# Patient Record
Sex: Male | Born: 1955
Health system: Southern US, Community
[De-identification: ages and names within clinical notes are randomized; demographics above are authoritative.]

## PROBLEM LIST (undated history)

## (undated) DIAGNOSIS — M545 Low back pain, unspecified: Secondary | ICD-10-CM

## (undated) DIAGNOSIS — G4733 Obstructive sleep apnea (adult) (pediatric): Secondary | ICD-10-CM

## (undated) DIAGNOSIS — J302 Other seasonal allergic rhinitis: Secondary | ICD-10-CM

## (undated) DIAGNOSIS — Z7289 Other problems related to lifestyle: Secondary | ICD-10-CM

## (undated) DIAGNOSIS — I10 Essential (primary) hypertension: Secondary | ICD-10-CM

## (undated) DIAGNOSIS — I4821 Permanent atrial fibrillation: Secondary | ICD-10-CM

## (undated) DIAGNOSIS — F1090 Alcohol use, unspecified, uncomplicated: Secondary | ICD-10-CM

## (undated) DIAGNOSIS — I739 Peripheral vascular disease, unspecified: Secondary | ICD-10-CM

## (undated) DIAGNOSIS — I493 Ventricular premature depolarization: Secondary | ICD-10-CM

## (undated) DIAGNOSIS — I779 Disorder of arteries and arterioles, unspecified: Secondary | ICD-10-CM

## (undated) DIAGNOSIS — I4891 Unspecified atrial fibrillation: Secondary | ICD-10-CM

## (undated) DIAGNOSIS — I499 Cardiac arrhythmia, unspecified: Secondary | ICD-10-CM

## (undated) DIAGNOSIS — I2510 Atherosclerotic heart disease of native coronary artery without angina pectoris: Secondary | ICD-10-CM

## (undated) DIAGNOSIS — E785 Hyperlipidemia, unspecified: Secondary | ICD-10-CM

## (undated) DIAGNOSIS — I5030 Unspecified diastolic (congestive) heart failure: Secondary | ICD-10-CM

## (undated) DIAGNOSIS — Z9989 Dependence on other enabling machines and devices: Secondary | ICD-10-CM

## (undated) DIAGNOSIS — F1910 Other psychoactive substance abuse, uncomplicated: Secondary | ICD-10-CM

## (undated) DIAGNOSIS — K219 Gastro-esophageal reflux disease without esophagitis: Secondary | ICD-10-CM

## (undated) DIAGNOSIS — M1990 Unspecified osteoarthritis, unspecified site: Secondary | ICD-10-CM

## (undated) HISTORY — DX: Disorder of arteries and arterioles, unspecified: I77.9

## (undated) HISTORY — DX: Unspecified diastolic (congestive) heart failure: I50.30

## (undated) HISTORY — DX: Unspecified osteoarthritis, unspecified site: M19.90

## (undated) HISTORY — DX: Other seasonal allergic rhinitis: J30.2

## (undated) HISTORY — DX: Other problems related to lifestyle: Z72.89

## (undated) HISTORY — DX: Permanent atrial fibrillation: I48.21

## (undated) HISTORY — DX: Dependence on other enabling machines and devices: Z99.89

## (undated) HISTORY — PX: TONSILLECTOMY: SUR1361

## (undated) HISTORY — DX: Low back pain, unspecified: M54.50

## (undated) HISTORY — DX: Essential (primary) hypertension: I10

## (undated) HISTORY — DX: Atherosclerotic heart disease of native coronary artery without angina pectoris: I25.10

## (undated) HISTORY — DX: Hyperlipidemia, unspecified: E78.5

## (undated) HISTORY — DX: Alcohol use, unspecified, uncomplicated: F10.90

## (undated) HISTORY — DX: Ventricular premature depolarization: I49.3

## (undated) HISTORY — DX: Other psychoactive substance abuse, uncomplicated: F19.10

## (undated) HISTORY — DX: Obstructive sleep apnea (adult) (pediatric): G47.33

## (undated) HISTORY — DX: Unspecified atrial fibrillation: I48.91

## (undated) HISTORY — DX: Low back pain: M54.5

## (undated) HISTORY — DX: Peripheral vascular disease, unspecified: I73.9

---

## 1999-06-18 ENCOUNTER — Inpatient Hospital Stay (HOSPITAL_COMMUNITY): Admission: EM | Admit: 1999-06-18 | Discharge: 1999-06-21 | Payer: Self-pay | Admitting: Emergency Medicine

## 2009-06-08 HISTORY — PX: CORONARY ANGIOPLASTY WITH STENT PLACEMENT: SHX49

## 2010-05-20 ENCOUNTER — Encounter: Payer: Self-pay | Admitting: Physician Assistant

## 2010-05-20 ENCOUNTER — Inpatient Hospital Stay (HOSPITAL_COMMUNITY)
Admission: EM | Admit: 2010-05-20 | Discharge: 2010-05-22 | Payer: Self-pay | Source: Home / Self Care | Attending: Cardiology | Admitting: Cardiology

## 2010-05-22 ENCOUNTER — Encounter: Payer: Self-pay | Admitting: Cardiology

## 2010-05-27 ENCOUNTER — Telehealth (INDEPENDENT_AMBULATORY_CARE_PROVIDER_SITE_OTHER): Payer: Self-pay | Admitting: *Deleted

## 2010-06-17 ENCOUNTER — Ambulatory Visit
Admission: RE | Admit: 2010-06-17 | Discharge: 2010-06-17 | Payer: Self-pay | Source: Home / Self Care | Attending: Physician Assistant | Admitting: Physician Assistant

## 2010-06-17 DIAGNOSIS — I10 Essential (primary) hypertension: Secondary | ICD-10-CM | POA: Insufficient documentation

## 2010-06-17 DIAGNOSIS — E785 Hyperlipidemia, unspecified: Secondary | ICD-10-CM | POA: Insufficient documentation

## 2010-06-17 DIAGNOSIS — I498 Other specified cardiac arrhythmias: Secondary | ICD-10-CM | POA: Insufficient documentation

## 2010-06-17 DIAGNOSIS — I2510 Atherosclerotic heart disease of native coronary artery without angina pectoris: Secondary | ICD-10-CM | POA: Insufficient documentation

## 2010-06-17 DIAGNOSIS — F1910 Other psychoactive substance abuse, uncomplicated: Secondary | ICD-10-CM | POA: Insufficient documentation

## 2010-06-23 ENCOUNTER — Encounter: Payer: Self-pay | Admitting: Physician Assistant

## 2010-06-25 ENCOUNTER — Telehealth (INDEPENDENT_AMBULATORY_CARE_PROVIDER_SITE_OTHER): Payer: Self-pay | Admitting: *Deleted

## 2010-06-26 ENCOUNTER — Telehealth (INDEPENDENT_AMBULATORY_CARE_PROVIDER_SITE_OTHER): Payer: Self-pay | Admitting: *Deleted

## 2010-06-26 ENCOUNTER — Encounter (INDEPENDENT_AMBULATORY_CARE_PROVIDER_SITE_OTHER): Payer: Self-pay | Admitting: *Deleted

## 2010-06-26 ENCOUNTER — Encounter: Payer: Self-pay | Admitting: Physician Assistant

## 2010-07-01 ENCOUNTER — Encounter: Payer: Self-pay | Admitting: Physician Assistant

## 2010-07-04 ENCOUNTER — Encounter (INDEPENDENT_AMBULATORY_CARE_PROVIDER_SITE_OTHER): Payer: Self-pay | Admitting: *Deleted

## 2010-07-10 NOTE — Progress Notes (Signed)
Summary: Weakness  Phone Note Call from Patient Call back at [facility] Phone [phone]   Summary of Call: Pt states he had stents placed last Wed. He felt great this am. He went out and did some work outside. He was climbing a ladder and felt really weak in the knees. No syncope, near-syncope, SOB, chest pain or other symptoms. Pt states BP was 133/67 and blood sugar was 99.   He will monitor symptoms. If symptoms continue or worsening pt will go to ER for eval. Pt verbalized understanding.  Initial call taken by: Cyril Loosen, RN, BSN,  May 27, 2010 2:23 PM

## 2010-07-10 NOTE — Assessment & Plan Note (Signed)
Summary: EPH -POST CATH [facility] 12/15   Visit Type:  Follow-up Primary Provider:  Lynden Oxford   History of Present Illness: patient presents for post hospital followup from [facility], where he presented directly for evaluation of CP.  Patient ruled out for MI with serial markers. He underwent coronary angiography, status post history of BMS of the LAD in 2001, and was found to have severe proximal RCA and mid LAD stenoses, both successfully treated with DES. There was residual 75-80% distal RCA stenosis, not amenable to PCI.   Of note, patient was not placed on a beta blocker, secondary to bradycardia. Recommendations were to further evaluate the distal RCA lesion with an outpatient Myoview.  Clinically, the patient has not had any recurrent chest discomfort. He has, however, experienced intermittent dizziness, following moderate activity, but no frank syncope. He is not on any nodal blocking agents. Of note, he has recorded pulse readings as low as 40, following these episodes, but with stable BP.  Patient has since stopped drinking alcohol, and smoking marijuana. He notes no complications of the right wrist incision site. He is plagued by a probable pulled muscle just above the left knee, and has had some difficulty ambulating.  Preventive Screening-Counseling & Management  Alcohol-Tobacco     Smoking Status: quit     Year Quit: 2001  Current Medications (verified): 1)  Th Enteric Aspirin 325 Mg Tbec (Aspirin) .... Take 1 Tablet By Mouth Once A Day 2)  Tramadol Hcl 50 Mg Tabs (Tramadol Hcl) .... Take 1-2 Tablet By Mouth Two Times A Day 3)  Lisinopril 40 Mg Tabs (Lisinopril) .... Take 1/2 Tablet By Mouth Once A Day 4)  Simvastatin 40 Mg Tabs (Simvastatin) .... Take 1 Tablet By Mouth Once A Day 5)  Maxzide-25 37.5-25 Mg Tabs (Triamterene-Hctz) .... Take 1 Tablet By Mouth Once A Day 6)  Fluticasone Propionate 50 Mcg/act Susp (Fluticasone Propionate) .... 2 Sprays/nostril Daily 7)   Loratadine 10 Mg Tabs (Loratadine) .... Take 1 Tablet By Mouth Once A Day 8)  Multivitamins  Tabs (Multiple Vitamin) .... Take 1 Tablet By Mouth Once A Day 9)  Alprazolam 0.5 Mg Tabs (Alprazolam) .... Take 1 Tablet By Mouth Once A Day At [facility] 10)  Miralax  Powd (Polyethylene Glycol 3350) .Marland KitchenMarland KitchenMarland Kitchen 17 Gm Mixed With 8oz Water By Mouth Daily 11)  Fish Oil Double Strength 1200 Mg Caps (Omega-3 Fatty Acids) .... Take 1 Tablet By Mouth Two Times A Day 12)  Effient 10 Mg Tabs (Prasugrel Hcl) .... Take 1 Tablet By Mouth Once A Day 13)  Nitrostat 0.4 Mg Subl (Nitroglycerin) .... Use As Directed Chest Pain As Needed 14)  Folic Acid 1 Mg Tabs (Folic Acid) .... Take 1 Tablet By Mouth Once A Day  Allergies (verified): 1)  ! Pcn  Comments:  Nurse/Medical Assistant: The patient's medication list and allergies were reviewed with the patient and were updated in the Medication and Allergy Lists.  Past History:  Past Medical History: Last updated: 06/17/2010  1. Chest pain/CAD.       a.     June 19, 1999, catheterization feeling 90% stenosis in        the LAD with otherwise nonobstructive disease.  An EF of 55% with        mid to distal anterior hypokinesis.  The LAD was successfully        stented with a bare-metal stent.   2. Hypertension.   3. Hyperlipidemia.   4. Osteoarthritis.   5.  Low back pain.   6. Seasonal allergies.   7. Obstructive sleep apnea using CPAP.   8. Polysubstance use including marijuana and Vicodin, which he       obtained from the street.   Social History: Smoking Status:  quit  Review of Systems       No fevers, chills, hemoptysis, dysphagia, melena, hematocheezia, hematuria, rash, claudication, orthopnea, pnd, pedal edema. All other systems negative.   Vital Signs:  Patient profile:   55 year old male Height:      68 inches Weight:      268 pounds BMI:     40.90 Pulse rate:   49 / minute BP sitting:   134 / 79  (left arm) Cuff size:   large  Vitals Entered By:  Carlye Grippe (June 17, 2010 10:49 AM)  Nutrition Counseling: Patient's BMI is greater than 25 and therefore counseled on weight management options.  Physical Exam  Additional Exam:  GEN: 55 year old male, morbidly obese, no distress HEENT: NCAT,PERRLA,EOMI NECK: palpable pulses, no bruits; no JVD; no TM LUNGS: CTA bilaterally HEART: RRR (S1S2); no significant murmurs; no rubs; no gallops ABD: soft, NT; intact BS [id number] right wrist incision site, but no hematoma or ecchymosis; palpable RP SKIN: warm, dry MUSC: no obvious deformity NEURO: A/O (x3)     Impression & Recommendations:  Problem # 1:  CAD (ICD-414.00)  clinically stable on current medication regimen, with no recurrent angina. Will defer recommendation for a followup stress Myoview, given his current inability to walk, at this point in time. Moreover, he is not experiencing any CP. Of note, I would like to do this as a GXT, to assess for chronotropic incompetence.  Problem # 2:  BRADYCARDIA (ICD-427.89)  Will order a 48 hour Holter monitor to rule out marked bradycardia, as possible etiology for these recent episodes of dizziness. Of note, this may be related to his residual RCA disease. We'll arrange early clinic followup with myself and Dr. Andee Lineman.  Problem # 3:  HYPERTENSION (ICD-401.9) Assessment: Comment Only  His updated medication list for this problem includes:    Th Enteric Aspirin 325 Mg Tbec (Aspirin) .Marland Kitchen... Take 1 tablet by mouth once a day    Lisinopril 40 Mg Tabs (Lisinopril) .Marland Kitchen... Take 1/2 tablet by mouth once a day    Maxzide-25 37.5-25 Mg Tabs (Triamterene-hctz) .Marland Kitchen... Take 1 tablet by mouth once a day  Problem # 4:  SUBSTANCE ABUSE, MULTIPLE (ICD-305.90)  reportedly has since stopped drinking EtOH, and smoking marijuana. Quit tobacco in 2001.  Problem # 5:  DYSLIPIDEMIA (ICD-272.4)  recent LDL 72. Continue current dose of simvastatin.  Other Orders: EKG w/ Interpretation (93000) Holter  Monitor (Holter Monitor)  Patient Instructions: 1)  Follow up with Dr. Earnestine Leys on Monday, July 28, 2010 at 11am. 2)  Your physician has recommended that you wear a holter monitor.  Holter monitors are medical devices that record the heart's electrical activity. Doctors most often use these monitors to diagnose arrhythmias. Arrhythmias are problems with the speed or rhythm of the heartbeat. The monitor is a small, portable device. You can wear one while you do your normal daily activities. This is usually used to diagnose what is causing palpitations/syncope (passing out). 3)  Your physician recommends that you continue on your current medications as directed. Please refer to the Current Medication list given to you today.

## 2010-07-10 NOTE — Progress Notes (Signed)
Summary: PHONE: MEDICATIONS AND STRESS TEST  Phone Note Call from Patient Call back at Home Phone [phone]   Caller: Patient Summary of Call: Please call Mr. custard in regards to the letter you gave him about his stress test. He has questions about his medications. Initial call taken by: Zachary George,  June 26, 2010 4:07 PM  Follow-up for Phone Call        Left message to call back to on pt's voicemail. Cyril Loosen, RN, BSN  June 27, 2010 11:37 AM Pt questioned if he could take BP meds d/t wording on instruction sheet. However, instruction sheet also states to take meds with water. Pt informed he can take meds with water the morning of test. Pt verbalized understanding.  Follow-up by: Cyril Loosen, RN, BSN,  June 27, 2010 5:00 PM

## 2010-07-10 NOTE — Letter (Signed)
Summary: Pharmacist, community at [facility]. [address]   [place], [place] [address]   Phone: [phone]  Fax: [phone]      [facility] Cardiovascular Services  Cardiolite Stress Test     [facility] [place]  Your doctor has ordered a Cardiolite Stress Test to help determine the condition of your heart during stress. If you take blood pressure medicine, ask your doctor if you should take it the day of your test. You should not have anything to eat or drink at least 4 hours before your test is scheduled, and no caffeine (coffee, tea, decaf. or chocolate) for 24 hours before your test.   You will need to register at the Outpatient/Main Entrance at the hospital 30 minutes before your appointment time. It is a good idea to bring a copy of your order with you. They will direct you to the Diagnostic Imaging (Radiology) Department.  You will be asked to undress from the waist up and be given a hospital gown to wear, so dress comfortably from the waist down, for example:    Sweat pants, shorts or skirt   Rubber-soled lace up shoes (i.e. tennis shoes)  Plan on about three hours from registration to release from the hospital.    Date of Test:              Time of Test              You may take all of your medications with water the morning of your test.

## 2010-07-10 NOTE — Progress Notes (Signed)
Summary: Possibly schedule Stress Cardiolite/Effient available  Phone Note Outgoing Call   Call placed by: Cyril Loosen, RN, BSN,  June 25, 2010 4:48 PM Summary of Call: Pt notified he can pick up Effient from assistance. Pt states Gene mentioned a stress Cardiolite during last office visit but was waiting for pt's leg to heal. Pt states he thinks he could do this test next week if Gene still feels he needs this. Will have Gene review and notify pt. Initial call taken by: Cyril Loosen, RN, BSN,  June 25, 2010 4:50 PM  Follow-up for Phone Call        yes, if he is now able to walk, I want to proceed with a GXT Cardiolite, to r/o chronotropic incompetence Follow-up by: Nelida Meuse, PA-C,  June 26, 2010 11:54 AM  Additional Follow-up for Phone Call Additional follow up Details #1::        Will have Vicky schedule and notify pt.  I will put in order and instruction sheet.  Additional Follow-up by: Cyril Loosen, RN, BSN,  June 26, 2010 1:40 PM

## 2010-07-10 NOTE — Letter (Signed)
Summary: Engineer, materials at [facility]  [address]   [place], [place] [address]   Phone: [phone]  Fax: [phone]        July 04, 2010 MRN: [id number]    Martin [name] [address] [place], [place]  [address]    Dear Mr. Pella,  Your test ordered by Selena Batten has been reviewed by your physician (or physician assistant) and was found to be normal or stable. Your physician (or physician assistant) felt no changes were needed at this time.  ____ Echocardiogram  __X__ Cardiac Stress Test  ____ Lab Work  ____ Peripheral vascular study of arms, legs or neck  ____ CT scan or X-ray  ____ Lung or Breathing test  __X__ Other: Heart Monitor-Stable   Thank you.   Cyril Loosen, RN, BSN    Duane Boston, M.D., F.A.C.C. Thressa Sheller, M.D., F.A.C.C. Oneal Grout, M.D., F.A.C.C. Cheree Ditto, M.D., F.A.C.C. Daiva Nakayama, M.D., F.A.C.C. Kenney Houseman, M.D., F.A.C.C. Jeanne Ivan, PA-C

## 2010-07-10 NOTE — Medication Information (Signed)
Summary: RX Folder/ PICKED UP EFFIENT  RX Folder/ PICKED UP EFFIENT   Imported By: Dorise Hiss 06/27/2010 15:40:38  _____________________________________________________________________  External Attachment:    Type:   Image     Comment:   External Document

## 2010-07-10 NOTE — Procedures (Signed)
Summary: Holter Final Summary  Holter Final Summary   Imported By: Cyril Loosen, RN, BSN 06/25/2010 16:57:02  _____________________________________________________________________  External Attachment:    Type:   Image     Comment:   External Document

## 2010-07-28 ENCOUNTER — Ambulatory Visit: Payer: Self-pay | Admitting: Cardiology

## 2010-07-28 ENCOUNTER — Ambulatory Visit (INDEPENDENT_AMBULATORY_CARE_PROVIDER_SITE_OTHER): Payer: Self-pay | Admitting: Cardiology

## 2010-07-28 ENCOUNTER — Encounter: Payer: Self-pay | Admitting: Cardiology

## 2010-07-28 DIAGNOSIS — Z9861 Coronary angioplasty status: Secondary | ICD-10-CM

## 2010-07-28 DIAGNOSIS — I2510 Atherosclerotic heart disease of native coronary artery without angina pectoris: Secondary | ICD-10-CM

## 2010-07-28 DIAGNOSIS — M1990 Unspecified osteoarthritis, unspecified site: Secondary | ICD-10-CM | POA: Insufficient documentation

## 2010-08-05 NOTE — Assessment & Plan Note (Signed)
Summary: 2 WK FU PER 1/10 OV W/GENE OK FOR THIS DAY PER GENE JM/SRS   Visit Type:  Follow-up Primary Provider:  Lynden Oxford  CC:  no cardiology complaints.  History of Present Illness: Patient had a recent nuclear scan.  The patient could not walk on the treadmill and the study was changed to [place].  There was mild to moderate decreased uptake in the inferior wall with stress and rest.  There was no significant reversibility.  There appeared to be a mild scar in the inferior wall.  Ejection fraction was 54%.  Study was performed in January of 2012. A Holter monitor was performed in January 2012.  The baseline rhythm was normal sinus rhythm.  There were frequent PVCs.  There was nocturnal bradycardia with a heart rate below 40 beats/min and one run off supraventricular tachycardia of around hundred and 20 beats/min.  No other arrhythmias were noted. The Holter monitor was performed for recent history of dizziness. The patient also underwent a cardiac catheterization several months ago for chest pain.  He has better metal stents to the LAD in 2001, recently he  was found a severe proximal RCA mid LAD stenosis both successfully treated with DES.  There was residual 75 to 80% distal RCA stenosis which was not amenable to PCI. He was seen in follow-up June 17, 2010 and reported no substernal chest pain.  He also stopped smoking marijuana and stop drinking alcohol. the patient has a carotid Doppler for a few weeks ago  which showed 50 to 69% stenosed the left side. From a cardiac standpoint she's been doing well.  He reports no chest pain or short of breath. He does complain of significant arthritis.  His primary care physician discontinued tramadol and changed him to diclofenac.   Current Medications (verified): 1)  Th Enteric Aspirin 325 Mg Tbec (Aspirin) .... Take 1 Tablet By Mouth Once A Day 2)  Lisinopril 40 Mg Tabs (Lisinopril) .... Take 1/2 Tablet By Mouth Once A Day 3)  Simvastatin 40  Mg Tabs (Simvastatin) .... Take 1 Tablet By Mouth Once A Day 4)  Maxzide-25 37.5-25 Mg Tabs (Triamterene-Hctz) .... Take 1 Tablet By Mouth Once A Day 5)  Fluticasone Propionate 50 Mcg/act Susp (Fluticasone Propionate) .... 2 Sprays/nostril Daily 6)  Loratadine 10 Mg Tabs (Loratadine) .... Take 1 Tablet By Mouth Once A Day 7)  Multivitamins  Tabs (Multiple Vitamin) .... Take 1 Tablet By Mouth Once A Day 8)  Alprazolam 0.5 Mg Tabs (Alprazolam) .... Take 1 Tablet By Mouth Once A Day At [facility] 9)  Miralax  Powd (Polyethylene Glycol 3350) .Marland KitchenMarland KitchenMarland Kitchen 17 Gm Mixed With 8oz Water By Mouth Daily 10)  Fish Oil Double Strength 1200 Mg Caps (Omega-3 Fatty Acids) .... Take 1 Tablet By Mouth Two Times A Day 11)  Effient 10 Mg Tabs (Prasugrel Hcl) .... Take 1 Tablet By Mouth Once A Day 12)  Nitrostat 0.4 Mg Subl (Nitroglycerin) .... Use As Directed Chest Pain As Needed 13)  Folic Acid 1 Mg Tabs (Folic Acid) .... Take 1 Tablet By Mouth Once A Day 14)  Cefprozil 500 Mg Tabs (Cefprozil) .... Take 1 Tab Two Times A Day 15)  Diclofenac Sodium 75 Mg Tbec (Diclofenac Sodium) .... Take 1 Tab Two Times A Day  Allergies (verified): 1)  ! Pcn  Comments:  Nurse/Medical Assistant: patient brought med list he has stopped his tramadol and started diclofenac 75 mg two times a day and also on cefprozil 500 mg two times  a day for sinus infection per Dr.David Tapper [facility]  Past History:  Family History: Last updated: 07-01-10     Mother died at age 1 several years after having had a   stroke with question of small bowel obstruction.  Father died of an MI    FAMILY HISTORY:  Mother died at age 41 several years after having had a   stroke with question of small bowel obstruction.  Father died of an MI   at 31.  Brother died of an MI at 94.     at 68.  Brother died of an MI at 22.      Social History: Last updated: 07-01-2010 The patient lives in [place] with his wife.  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  He has about a 35-pack-year history   of tobacco abuse quit in January 2001.  He drinks 6-8 ounces of liquor   per day.  He smokes marijuana weekly and says he occasionally takes   Vicodin, which is not prescribed.  He is very active at work, working in   Holiday representative, but does not routinely exercise.      Risk Factors: Smoking Status: quit (07-01-2010)  Past Medical History:  1. Chest pain/CAD.       a.     June 19, 1999, catheterization feeling 90% stenosis in        the LAD with otherwise nonobstructive disease.  An EF of 55% with        mid to distal anterior hypokinesis.  The LAD was successfully        stented with a bare-metal stent.   2. Hypertension.   3. Hyperlipidemia.   4. Osteoarthritis.   5. Low back pain.   6. Seasonal allergies.   7. Obstructive sleep apnea using CPAP.   8. Polysubstance use including marijuana and Vicodin, which he       obtained from the street.  drug eluding stent placement to the proximal RCA and mid LAD and residual 75 to 80% distal RCA stenosis January 2012 normal LV function ejection fraction 55%  Review of Systems       The patient complains of shortness of breath and joint pain.  The patient denies fatigue, malaise, fever, weight gain/loss, vision loss, decreased hearing, hoarseness, chest pain, palpitations, prolonged cough, wheezing, sleep apnea, coughing up blood, abdominal pain, blood in stool, nausea, vomiting, diarrhea, heartburn, incontinence, blood in urine, muscle weakness, leg swelling, rash, skin lesions, headache, fainting, dizziness, depression, anxiety, enlarged lymph nodes, easy bruising or bleeding, and environmental allergies.    Vital Signs:  Patient profile:   55 year old male Weight:      266 pounds BMI:     40.59 Pulse rate:   84 / minute BP sitting:   116 / 55  (right arm)  Vitals Entered By: Dreama Saa, CNA (July 28, 2010 11:10 AM)  Physical Exam  Additional Exam:  General:  Well-developed, well-nourished in no distress, obese head: Normocephalic and atraumatic eyes PERRLA/EOMI intact, conjunctiva and lids normal nose: No deformity or lesions mouth normal dentition, normal posterior pharynx neck: Supple, no JVD.  No masses, thyromegaly or abnormal cervical nodes lungs: Normal breath sounds bilaterally without wheezing.  Normal percussion heart: regular rate and rhythm with normal S1 and S2, no S3 or S4.  PMI is normal.  No pathological murmurs abdomen: Normal bowel sounds, abdomen is soft and nontender without masses, organomegaly or hernias noted.  No hepatosplenomegaly  musculoskeletal: Back normal, normal gait muscle strength and tone normal pulsus: Pulse is normal in all 4 extremities Extremities: No peripheral pitting edema neurologic: Alert and oriented x 3 skin: Intact without lesions or rashes, vitiligo cervical nodes: No significant adenopathy psychologic: Normal affect    Impression & Recommendations:  Problem # 1:  CAD (ICD-414.00) patient has a bare metal stent in 2001.  More recently underwent drug-eluting stent placement to the proximal RCA and mid LAD.  He has a residual 75 to 80% distal RCA stenosis, but denies any chest pain and also  the Cardiolite study from January 2012 showed no reversible defect although there was decreased uptake in the inferior wall  both with stress and rest.   His ejection fraction was 54%.the patient has been carefully instructed not todiscontinue  antiplatelet agents without letting our office know.   I told him that he is at high risk for stent thrombosis if  prasugrel is discontinued within the first year  even short-term  for procedures like colonoscopy or other  elective interventions. His updated medication list for this problem includes:    Th Enteric Aspirin 325 Mg Tbec (Aspirin) .Marland Kitchen... Take 1 tablet by mouth once a day    Lisinopril 40 Mg Tabs (Lisinopril) .Marland Kitchen... Take 1/2 tablet by mouth once a day    Effient 10  Mg Tabs (Prasugrel hcl) .Marland Kitchen... Take 1 tablet by mouth once a day    Nitrostat 0.4 Mg Subl (Nitroglycerin) ..... Use as directed chest pain as needed  Problem # 2:  BRADYCARDIA (ICD-427.89) the patient has nocturnal bradycardia.  His Holter monitor however showed no significant arrhythmias.no further workup is required. His updated medication list for this problem includes:    Th Enteric Aspirin 325 Mg Tbec (Aspirin) .Marland Kitchen... Take 1 tablet by mouth once a day    Lisinopril 40 Mg Tabs (Lisinopril) .Marland Kitchen... Take 1/2 tablet by mouth once a day    Effient 10 Mg Tabs (Prasugrel hcl) .Marland Kitchen... Take 1 tablet by mouth once a day    Nitrostat 0.4 Mg Subl (Nitroglycerin) ..... Use as directed chest pain as needed  Problem # 3:  SUBSTANCE ABUSE, MULTIPLE (ICD-305.90) patient is discontinue marijuana and alcohol use.  Problem # 4:  DEGENERATIVE JOINT DISEASE (ICD-715.90) the patient has symptoms of osteoarthritis.   Tramadol was discontinued.   I told him to be careful with diclofenac because of its interaction with prasugrel an increased risk for bleeding.  I asked him to minimize the use of this medication brought her to consider a combination of tramadol  with high dose Tylenol which is very effective in pain relief.  Patient Instructions: 1)  Your physician recommends that you continue on your current medications as directed. Please refer to the Current Medication list given to you today. 2)  Your physician wants you to follow-up in:6 months. You will receive a reminder letter in the mail about two months in advance. If you don't receive a letter, please call our office to schedule the follow-up appointment.

## 2010-08-18 LAB — BASIC METABOLIC PANEL
BUN: 19 mg/dL (ref 6–23)
CO2: 24 mEq/L (ref 19–32)
Calcium: 8.6 mg/dL (ref 8.4–10.5)
Chloride: 109 mEq/L (ref 96–112)
Creatinine, Ser: 1.3 mg/dL (ref 0.4–1.5)
GFR calc Af Amer: >60 mL/min (ref >60)
GFR calc non Af Amer: 57 mL/min — ABNORMAL LOW (ref >60)
Glucose, Bld: 92 mg/dL (ref 70–99)
Potassium: 3.9 mEq/L (ref 3.5–5.1)
Sodium: 139 mEq/L (ref 135–145)

## 2010-08-18 LAB — CBC
HCT: 34.6 % — ABNORMAL LOW (ref 39.0–52.0)
Hemoglobin: 11.6 g/dL — ABNORMAL LOW (ref 13.0–17.0)
MCH: 32.5 pg (ref 26.0–34.0)
MCHC: 33.5 g/dL (ref 30.0–36.0)
MCV: 96.9 fL (ref 78.0–100.0)
Platelets: 156 10*3/uL (ref 150–400)
RBC: 3.57 MIL/uL — ABNORMAL LOW (ref 4.22–5.81)
RDW: 12.7 % (ref 11.5–15.5)
WBC: 7.2 10*3/uL (ref 4.0–10.5)

## 2010-08-19 LAB — CK TOTAL AND CKMB (NOT AT ARMC)
Relative Index: INVALID (ref 0.0–2.5)
Total CK: 47 U/L (ref 7–232)

## 2010-08-19 LAB — POCT CARDIAC MARKERS
CKMB, poc: <1 ng/mL — ABNORMAL LOW (ref 1.0–8.0)
Myoglobin, poc: 63.7 ng/mL (ref 12–200)
Myoglobin, poc: 65 ng/mL (ref 12–200)

## 2010-08-19 LAB — PROTIME-INR: Prothrombin Time: 13 seconds (ref 11.6–15.2)

## 2010-08-19 LAB — CBC
HCT: 38.8 % — ABNORMAL LOW (ref 39.0–52.0)
Hemoglobin: 12.8 g/dL — ABNORMAL LOW (ref 13.0–17.0)
MCH: 32 pg (ref 26.0–34.0)
MCHC: 33 g/dL (ref 30.0–36.0)
MCV: 97 fL (ref 78.0–100.0)
MCV: 98 fL (ref 78.0–100.0)
Platelets: 180 10*3/uL (ref 150–400)
RBC: 3.96 MIL/uL — ABNORMAL LOW (ref 4.22–5.81)
RDW: 12.5 % (ref 11.5–15.5)
WBC: 7.5 10*3/uL (ref 4.0–10.5)

## 2010-08-19 LAB — RAPID URINE DRUG SCREEN, HOSP PERFORMED
Amphetamines: NOT DETECTED
Barbiturates: NOT DETECTED
Benzodiazepines: POSITIVE — AB
Cocaine: NOT DETECTED
Opiates: NOT DETECTED
Tetrahydrocannabinol: NOT DETECTED

## 2010-08-19 LAB — COMPREHENSIVE METABOLIC PANEL
AST: 38 U/L — ABNORMAL HIGH (ref 0–37)
Albumin: 3.7 g/dL (ref 3.5–5.2)
BUN: 17 mg/dL (ref 6–23)
Calcium: 9.6 mg/dL (ref 8.4–10.5)
Chloride: 106 mEq/L (ref 96–112)
Creatinine, Ser: 1.16 mg/dL (ref 0.4–1.5)
GFR calc Af Amer: >60 mL/min (ref >60)
Total Bilirubin: 0.4 mg/dL (ref 0.3–1.2)

## 2010-08-19 LAB — LIPID PANEL
Cholesterol: 168 mg/dL (ref 0–200)
HDL: 38 mg/dL — ABNORMAL LOW (ref >39)
LDL Cholesterol: 72 mg/dL (ref 0–99)
Total CHOL/HDL Ratio: 4.4 RATIO
Triglycerides: 291 mg/dL — ABNORMAL HIGH (ref <150)

## 2010-08-19 LAB — HEMOGLOBIN A1C
Hgb A1c MFr Bld: 6.1 % — ABNORMAL HIGH (ref <5.7)
Mean Plasma Glucose: 128 mg/dL — ABNORMAL HIGH (ref <117)

## 2010-08-19 LAB — TROPONIN I: Troponin I: 0.03 ng/mL (ref 0.00–0.06)

## 2010-08-19 LAB — URINALYSIS, ROUTINE W REFLEX MICROSCOPIC
Bilirubin Urine: NEGATIVE
Hgb urine dipstick: NEGATIVE
Ketones, ur: NEGATIVE mg/dL
Nitrite: NEGATIVE
Protein, ur: NEGATIVE mg/dL
Specific Gravity, Urine: 1.005 (ref 1.005–1.030)
Urobilinogen, UA: 0.2 mg/dL (ref 0.0–1.0)

## 2010-08-19 LAB — CARDIAC PANEL(CRET KIN+CKTOT+MB+TROPI)
CK, MB: 1.5 ng/mL (ref 0.3–4.0)
CK, MB: 1.6 ng/mL (ref 0.3–4.0)
Troponin I: 0.01 ng/mL (ref 0.00–0.06)

## 2010-08-19 LAB — BASIC METABOLIC PANEL
BUN: 15 mg/dL (ref 6–23)
Chloride: 106 mEq/L (ref 96–112)
Creatinine, Ser: 1.19 mg/dL (ref 0.4–1.5)
Glucose, Bld: 107 mg/dL — ABNORMAL HIGH (ref 70–99)
Potassium: 4.1 mEq/L (ref 3.5–5.1)

## 2010-10-24 NOTE — Discharge Summary (Signed)
[place]. [facility]  Patient:    Martin [name]                       MRN: [id number] Adm. Date:  [id number] Disc. Date: 06/21/99 Attending:  Junious Silk Dictator:   Dian Queen, P.A.C. [facility] CC:         Dr. Wyvonnia Lora, [place], [place].                  Referring Physician Discharge [place]  HISTORY OF PRESENT ILLNESS:  Essentially, is that of a 55 year old white male with hypertension and three-week history of intermittent chest pain, described as a burning discomfort.  He saw Dr. Margo Common and was referred to [place] for further evaluation.  Family history is positive for coronary disease.  He is a cigarette smoker.  He has been on antihypertensive medications.  ACCESSORY CLINICAL FINDINGS:  Electrolytes and renal function totally within normal limits.  Hemoglobin 14.2, with a hematocrit of 41.8 and a white count of 8700.  CKs were low with negative MBs.  Lipid profile revealed total cholesterol of 205, with HDL of 33 and LDL of 51.  LFTs were normal.  Chest x-ray was unremarkable.  COURSE IN THE HOSPITAL:  Patient was admitted with chest pain and ruled out. He ultimately was catheterized by Dr. Chales Abrahams, and found to have a 90% lesion in his  proximal/ostial LAD, as well as a 30-40% lesion in the proximal right and a 60-70% lesion in the mid/distal right.  With his anatomy, it was felt that intervention was indicated.  This was stented by Dr. Chales Abrahams to 0% without complication.  He was enrolled in the ______ study.  He did have a prolonged pause, lasting up to nine seconds, while asleep.  Toprol was discontinued and he was carefully monitored. He was seen in consultation by Dr. Danise Mina, who felt he had mild hypersomnolence with obstructive sleep apnea.  He recommended weight loss, check a TSH, and ultimately, BiPAP machine.  There was a question of reflux, as well.  He will follow him as an outpatient.  FINAL DIAGNOSES: 1. Coronary artery  disease with left anterior descending artery lesion stented o    0% without complication. 2. Obstructive sleep apnea with marked bradyarrhythmias, as above (while on    Toprol - improved when Toprol discontinued). 3. Cigarette use. 4. Family history of coronary disease. 5. Controlled hypertension.  DISPOSITION:  Patient was discharged home.  MEDICATIONS: 1. Altace 10 mg q.d. 2. ______ study drug. 3. Plavix 75 mg q.d. 4. Aspirin one daily. 5. Nitroglycerin p.r.n.  FOLLOW-UP:  He will follow up with Dr. Jens Som in four to six weeks, two weeks  with the research team, and with our pulmonary department regarding his apnea. DD:  06/11/99 TD:  06/21/99 Job: [id number]

## 2010-10-24 NOTE — Cardiovascular Report (Signed)
[place]. [facility]  Patient:    Martin [name]                       MRN: [id number] Proc. Date: 06/19/99 Adm. Date:  [id number] Attending:  Junious Silk CC:         Veneda Melter, M.D. [facility]             Martin [name], M.D.             Madolyn Frieze Martin [name], M.D. [facility]                        Cardiac Catheterization  PROCEDURES PERFORMED: 1. Left heart catheterization. 2. Left ventriculogram. 3. PTCA and stenting of the proximal LAD.  DIAGNOSES: 1. Two-vessel coronary artery disease. 2. Normal left ventricular systolic function.  HISTORY:  [name] is a 55 year old white male with multiple cardiac risk factors.  He presented to his physicians office with vague substernal chest discomfort.  This event progressed over the past three weeks; he finally presented to his physician.  He was found to have an abnormal ECG and was subsequently admitted for unstable angina.  He ruled out for an acute myocardial infarction nd presents now for a left heart catheterization.  TECHNIQUE:  After informed consent was obtained, the patient was brought to the  catheterization lab and both groins were sterilely prepped and draped. Lidocaine 1% was used to infiltrate the right groin.  A 6-French sheath was placed in the  right femoral artery using the modified Seldinger technique.  Six-French JL4 and JR4 catheters were then used to engage the left and right coronary arteries. Selective angiography was performed in various projections using manual injections of contrast.  A 6-French pigtail catheter was advanced to the left ventricle.  left ventriculogram was performed using power injections of contrast.  INITIAL FINDINGS: 1. Left Anterior Descending:  This is a large-caliber vessel that provides two    diagonal branches.  The first diagonal branch is a smaller caliber vessel, that    arises proximally and has mild, diffuse disease.  The second diagonal  branch  arises in the mid section and also has mild disease.  The LAD has a    high-grade narrowing of greater then 90% in its proximal segment, at the takeoff    of the first septal perforator, and just prior to the first diagonal branch.    The remainder of the LAD system has mild, diffuse disease and narrowings up    to 30%. 2. Left Circumflex Artery:  This is a large-caliber vessel that consists of a    large first marginal branch in the proximal segment.  The left circumflex    system has mild irregularities. 3. Right Coronary Artery:  Dominant.  This is a large-caliber vessel that provides    the posterior descending artery as well as two posterior ventricular branches in    its terminal segment.  The right coronary artery is diffusely diseased, with    narrowings of 30-40% in the proximal mid section.  There is a focal narrowing of    70% in the distal section.  The posterior descending artery and posterior    ventricular branches had mild irregularities. 4. Left Ventricle:  Normal end-diastolic and end-systolic dimensions.  Overall    left ventricular function is well preserved, with an ejection fraction of    greater than 55%.  There is no mitral regurgitation.  Mild hypokinesis of the    mid and distal anterior wall is noted.  HEMODYNAMICS: 1. Left ventricular pressure:  170/20. 2. Aortic pressure:  173/100. 3. Left ventricular end-diastolic pressure:  26.  SUMMARY:  These findings were reviewed and discussed with the patient.  We elected to proceed with percutaneous intervention to the proximal LAD.  The patient had  previously been on an Integrilin drip, and this was continued.  Heparin was administered intravenously to maintain ACT of greater 250 sec.  PERCUTANEOUS INTERVENTION:  A 6-French JL4 guide catheter was used to engage the left coronary artery.  A .014 inch high-torque floppy wire was advanced into the distal LAD.  A 4.0 x 12 mm AVES 670 stent was then  carefully positioned under cineangiography in the proximal LAD, and deployed at 12 atm for 30 sec.  Repeat  angiography showed an excellent result, with mild residual waste of 10% in the id section of the stent.  A 4.0 x 9 mm Solaris balloon was introduced in two inflations at 18 atm for 30 seconds, performed within the distal and proximal segments of the stent.  Repeat angiography showed an excellent result, with 0% residual narrowing.  There was TIMI 3 flow through the LAD and no evidence of vessel damage.  Final angiography was performed at various projections confirming this.  Then the guide wire was removed.  The sheath was secured into position and the patient transferred to the Floor in stable condition.  FINAL RESULTS:  Successful stenting of the proximal LAD, with reduction of a 90% narrowing to 0%, with placement of a 4.0 x 12 mm AVES 670 dilated to 4.3 mm. DD:  06/19/99 TD:  06/19/99 Job: [id number]

## 2010-11-16 ENCOUNTER — Other Ambulatory Visit: Payer: Self-pay | Admitting: Internal Medicine

## 2010-12-17 ENCOUNTER — Telehealth: Payer: Self-pay | Admitting: *Deleted

## 2010-12-17 NOTE — Telephone Encounter (Signed)
Notified pt that he could pick up 1 bottle (7 tablets).  Also, pick up patient assistance application for further assistance in obtaining his medication.  Return back to office as soon as completed.  Patient verbalized understanding.  Has f/u already scheduled for September with Dr. Andee Lineman.

## 2010-12-17 NOTE — Telephone Encounter (Signed)
Left message on voicemail yesterday requesting samples of Effient.  Returned call - left message with male that answered phone to have pt call back.

## 2011-02-26 ENCOUNTER — Encounter: Payer: Self-pay | Admitting: *Deleted

## 2011-02-27 ENCOUNTER — Encounter: Payer: Self-pay | Admitting: Cardiology

## 2011-02-27 ENCOUNTER — Ambulatory Visit: Payer: Self-pay | Admitting: Cardiology

## 2011-03-15 ENCOUNTER — Other Ambulatory Visit: Payer: Self-pay | Admitting: Internal Medicine

## 2011-03-20 ENCOUNTER — Other Ambulatory Visit: Payer: Self-pay | Admitting: *Deleted

## 2011-03-20 MED ORDER — FOLIC ACID 1 MG PO TABS: 1.0000 mg | ORAL_TABLET | Freq: Every day | ORAL | Status: DC

## 2011-03-24 ENCOUNTER — Other Ambulatory Visit: Payer: Self-pay

## 2011-03-24 NOTE — Progress Notes (Signed)
Opened in error

## 2011-04-24 ENCOUNTER — Telehealth: Payer: Self-pay | Admitting: *Deleted

## 2011-04-27 NOTE — Telephone Encounter (Signed)
Patient notified that refill request was faxed today.  Will notify him when med comes in.

## 2011-06-10 ENCOUNTER — Encounter: Payer: Self-pay | Admitting: Cardiology

## 2011-06-10 ENCOUNTER — Ambulatory Visit (INDEPENDENT_AMBULATORY_CARE_PROVIDER_SITE_OTHER): Payer: Self-pay | Admitting: Cardiology

## 2011-06-10 VITALS — BP 169/81 | HR 61 | Ht 68.0 in | Wt 265.0 lb

## 2011-06-10 DIAGNOSIS — I2510 Atherosclerotic heart disease of native coronary artery without angina pectoris: Secondary | ICD-10-CM

## 2011-06-10 DIAGNOSIS — I779 Disorder of arteries and arterioles, unspecified: Secondary | ICD-10-CM

## 2011-06-10 DIAGNOSIS — G4700 Insomnia, unspecified: Secondary | ICD-10-CM

## 2011-06-10 DIAGNOSIS — E66812 Obesity, class 2: Secondary | ICD-10-CM | POA: Insufficient documentation

## 2011-06-10 DIAGNOSIS — I493 Ventricular premature depolarization: Secondary | ICD-10-CM

## 2011-06-10 DIAGNOSIS — E669 Obesity, unspecified: Secondary | ICD-10-CM

## 2011-06-10 DIAGNOSIS — I4949 Other premature depolarization: Secondary | ICD-10-CM

## 2011-06-10 MED ORDER — TRAZODONE HCL 50 MG PO TABS: 50.0000 mg | ORAL_TABLET | Freq: Every day | ORAL | Status: DC

## 2011-06-10 NOTE — Progress Notes (Signed)
Martin Bottoms, MD, [facility]IM Board Certified in Adult Cardiovascular Medicine,Internal Medicine and Critical Care Medicine    CC: follow up patient with coronary artery disease.  HPI: The patient is a 56 year old overweight white male with history of coronary artery disease and dual antiplatelet therapy with stent placement x2 with drug-eluting platform of the RCA and mid LAD in November of 2011.  The patient will follow up Cardiolite in January of 2012 which showed no ischemia but an inferior defect and an ejection fraction of 54%. From a cardiac standpoint he is doing well.  He reports no chest pain shortness of breath orthopnea or PND.  He is concerned about gaining weight however.  For a brief period he lost some weight but has now regained it.  He also reports some seasonal depression.  He feels down quite a bit.   He is an NYHA class II.  The patient is a hypertensive in the office but states that at home his blood pressure readings have been within normal limits.  He states that typically his blood pressure has been lowered and one 30 mmHg systolic.  He reports no bleeding complications at the end. From a cardiovascular standpoint he reports no palpitations orthopnea, presyncope, or PND.  PMH: reviewed and listed in Problem List in Electronic Records (and see below) Past Medical History  Diagnosis Date  . Chest pain     January 11,2001,catheterization feeling 90% stenosis in the LAD with otherwise nonobstructive disease. An EF of 55% with mid to distal anterior hypokinesis. The LAD was successfully stented with a bare-metal stent  . Hypertension   . Hyperlipidemia   . Osteoarthritis   . Low back pain   . Seasonal allergies   . OSA on CPAP   . Polysubstance abuse     use including marijuana and vicodin,which he obtained from the street  . CAD (coronary artery disease)     there is a 82,001, status post stent to the RCA and mid LAD December 2011.  Followup Cardiolite study ejection  fraction 54% with no reversible inferior defect 70-80% distal RCA not amenable to angioplasty.  Marland Kitchen PVC's (premature ventricular contractions)     status post Holter monitor normal sinus rhythm otherwise asymptomatic PVCs.  . Carotid artery disease     carotid Doppler course left side 5069% stenosis   No past surgical history on file.  Allergies/SH/FHX : available in Electronic Records for review  Allergies  Allergen Reactions  . Penicillins     REACTION: rash   History   Social History  . Marital Status: Divorced    Spouse Name: N/A    Number of Children: N/A  . Years of Education: N/A   Occupational History  .      works Armed forces operational officer employed   Social History Main Topics  . Smoking status: Former Smoker -- 1.0 packs/day for 35 years    Types: Cigarettes    Quit date: 06/08/2010  . Smokeless tobacco: Never Used  . Alcohol Use: Yes     drinks 6-8 ounces of liquor per day  . Drug Use: Yes     smokes marijuana weekly and says he occasionally takes vicodin,which is not prescribed  . Sexually Active: Not on file   Other Topics Concern  . Not on file   Social History Narrative   No exercise   Family History  Problem Relation Age of Onset  . Stroke Mother   . Heart attack Father   . Heart  attack Brother   . Other Mother     small bowel obstruction    Medications: Current Outpatient Prescriptions  Medication Sig Dispense Refill  . ALPRAZolam (XANAX) 0.5 MG tablet Take 0.5 mg by mouth at bedtime.        Marland Kitchen aspirin 325 MG EC tablet Take 325 mg by mouth daily.        . diclofenac (VOLTAREN) 75 MG EC tablet Take 75 mg by mouth 2 (two) times daily.        . fluticasone (FLONASE) 50 MCG/ACT nasal spray Place 2 sprays into the nose daily.        . folic acid (FOLVITE) 1 MG tablet Take 1 tablet (1 mg total) by mouth daily.  30 tablet  3  . lisinopril (PRINIVIL,ZESTRIL) 40 MG tablet Take 20 mg by mouth daily.        Marland Kitchen loratadine (CLARITIN) 10 MG tablet Take 10 mg by  mouth daily.        . Multiple Vitamin (MULTIVITAMIN) tablet Take 1 tablet by mouth daily.        . nitroGLYCERIN (NITROSTAT) 0.4 MG SL tablet Place 0.4 mg under the tongue every 5 (five) minutes as needed.        . Omega-3 Fatty Acids (FISH OIL) 1200 MG CAPS Take 1 capsule by mouth 2 (two) times daily.        . polyethylene glycol (MIRALAX / GLYCOLAX) packet Take 17 g by mouth daily.        . prasugrel (EFFIENT) 10 MG TABS Take 10 mg by mouth daily.        . simvastatin (ZOCOR) 40 MG tablet Take 40 mg by mouth at bedtime.        . triamterene-hydrochlorothiazide (MAXZIDE-25) 37.5-25 MG per tablet Take 1 tablet by mouth daily.        . traZODone (DESYREL) 50 MG tablet Take 1 tablet (50 mg total) by mouth at bedtime.  30 tablet  1    ROS: No nausea or vomiting. No fever or chills.No melena or hematochezia.No bleeding.No claudication.  Insomnia.  Seasonal depression.  Physical Exam: BP 169/81  Pulse 61  Ht 5\' 8"  (1.727 m)  Wt 265 lb (120.203 kg)  BMI 40.29 kg/m2 General:well-nourished white male in no apparent distress Neck:normal carotid upstroke and no carotid bruit.  JVD is approximate 5-6 cm.  No thyromegaly non-nodular thyroid Lungs:clear breath sounds bilaterally without wheezing. Cardiac:regular rate and rhythm with normal S1-S2 no murmur rub or gallop Vascular:1+ peripheral pitting edema.  Normal distal pulses Skin:warm and dry Physcologic:normal affect  12lead ZOX:WRUEAV sinus rhythm with nonspecific T-wave abnormality Limited bedside ECHO:N/A   Patient Active Problem List  Diagnoses  . DYSLIPIDEMIA  . SUBSTANCE ABUSE, MULTIPLE  . HYPERTENSION-unclear if controlled  . CAD  . BRADYCARDIA  . DEGENERATIVE JOINT DISEASE  . PVC's (premature ventricular contractions)-asymptomatic  . CAD (coronary artery disease)-status post multiple stent placements on dual antiplatelet therapy with prasugrel  . Carotid artery disease-50-69% on the left side.  . Obesity  . Insomnia     PLAN   The patient was counseled regarding weight reduction.  I recommended a low carbohydrate diet.  I recommended for the patient to remain on dual antiplatelet therapy.  The patient needs to monitor his blood pressure closely in the next couple of weeks to make sure that his readings are indeed less than 130/85 as we discussed in the office.  If not I told the patient to contact us and  we may need to adjust his antihypertensive medical regimen.  Followup carotid Dopplers needed.  Next clinic visit.  No ischemia testing needed at the present time, patient asymptomatic.  Continue statin drug therapy.  I provided the patient with trazodone in light of his seasonal depression associated with insomnia.  I gave her a prescription of 50 mg by mouth daily at bedtime

## 2011-06-10 NOTE — Patient Instructions (Signed)
   Trazodone 50mg  every evening as needed for sleep Your physician wants you to follow up in: 6 months.  You will receive a reminder letter in the mail one-two months in advance.  If you don't receive a letter, please call our office to schedule the follow up appointment

## 2011-07-07 ENCOUNTER — Other Ambulatory Visit: Payer: Self-pay | Admitting: Internal Medicine

## 2011-08-26 ENCOUNTER — Telehealth: Payer: Self-pay | Admitting: *Deleted

## 2011-08-26 NOTE — Telephone Encounter (Signed)
Done

## 2012-02-07 ENCOUNTER — Other Ambulatory Visit: Payer: Self-pay | Admitting: Cardiology

## 2012-02-22 ENCOUNTER — Ambulatory Visit: Payer: Self-pay | Admitting: Cardiology

## 2012-06-20 ENCOUNTER — Other Ambulatory Visit: Payer: Self-pay | Admitting: Family Medicine

## 2012-06-20 DIAGNOSIS — R1900 Intra-abdominal and pelvic swelling, mass and lump, unspecified site: Secondary | ICD-10-CM

## 2012-06-22 ENCOUNTER — Ambulatory Visit
Admission: RE | Admit: 2012-06-22 | Discharge: 2012-06-22 | Disposition: A | Discharge status: Home / Self Care | Payer: No Typology Code available for payment source | Source: Ambulatory Visit | Attending: Family Medicine | Admitting: Family Medicine

## 2012-06-22 DIAGNOSIS — R1900 Intra-abdominal and pelvic swelling, mass and lump, unspecified site: Secondary | ICD-10-CM

## 2012-08-19 ENCOUNTER — Other Ambulatory Visit: Payer: Self-pay

## 2012-08-19 MED ORDER — NITROGLYCERIN 0.4 MG SL SUBL: 0.4000 mg | SUBLINGUAL_TABLET | SUBLINGUAL | Status: DC | PRN

## 2012-08-19 NOTE — Telephone Encounter (Signed)
..   Requested Prescriptions   Signed Prescriptions Disp Refills  . nitroGLYCERIN (NITROSTAT) 0.4 MG SL tablet 25 tablet 0    Sig: Place 1 tablet (0.4 mg total) under the tongue every 5 (five) minutes as needed.    Authorizing Provider: Rollene Rotunda    Ordering User: Christella Hartigan, ROSE M  ..Patient needs to contact office to schedule  Appointment  for future refills.Ph:[phone]. Thank you.

## 2013-02-13 ENCOUNTER — Encounter: Payer: Self-pay | Admitting: *Deleted

## 2013-02-13 ENCOUNTER — Encounter: Payer: Self-pay | Admitting: Cardiovascular Disease

## 2013-02-13 ENCOUNTER — Ambulatory Visit (INDEPENDENT_AMBULATORY_CARE_PROVIDER_SITE_OTHER): Payer: BC Managed Care – PPO | Admitting: Cardiovascular Disease

## 2013-02-13 VITALS — BP 172/87 | HR 47 | Ht 68.0 in | Wt 256.0 lb

## 2013-02-13 DIAGNOSIS — I10 Essential (primary) hypertension: Secondary | ICD-10-CM

## 2013-02-13 DIAGNOSIS — I779 Disorder of arteries and arterioles, unspecified: Secondary | ICD-10-CM

## 2013-02-13 DIAGNOSIS — I6521 Occlusion and stenosis of right carotid artery: Secondary | ICD-10-CM

## 2013-02-13 DIAGNOSIS — I2510 Atherosclerotic heart disease of native coronary artery without angina pectoris: Secondary | ICD-10-CM

## 2013-02-13 DIAGNOSIS — E785 Hyperlipidemia, unspecified: Secondary | ICD-10-CM

## 2013-02-13 DIAGNOSIS — R079 Chest pain, unspecified: Secondary | ICD-10-CM

## 2013-02-13 DIAGNOSIS — I6529 Occlusion and stenosis of unspecified carotid artery: Secondary | ICD-10-CM

## 2013-02-13 MED ORDER — LISINOPRIL 40 MG PO TABS: 40.0000 mg | ORAL_TABLET | Freq: Every day | ORAL | Status: DC

## 2013-02-13 MED ORDER — ASPIRIN 81 MG PO TBEC: 81.0000 mg | DELAYED_RELEASE_TABLET | Freq: Every day | ORAL | Status: DC

## 2013-02-13 MED ORDER — ISOSORBIDE MONONITRATE ER 30 MG PO TB24: 30.0000 mg | ORAL_TABLET | Freq: Every day | ORAL | Status: DC

## 2013-02-13 NOTE — Progress Notes (Signed)
Patient ID: Martin [name], male   DOB: August 03, 1955, 57 y.o.   MRN: [id number]   SUBJECTIVE: Martin [name] is a 57 year old man with a history of coronary artery disease with stent placement x 2 with drug-eluting platform of the RCA and mid LAD in November of 2011. The patient had a follow up Cardiolite in January of 2012 which showed no ischemia but an inferior defect and an ejection fraction of 54%.   He has been experiencing chest discomfort for the past few months, occurring twice a week. It usually occurs during his morning walks. It does not radiate to the shoulder, arm, jaw, or back. It may last a few minutes. He's taken nitro for it on 3 occasions with relief, but doesn't like taking it. It is not associated with palpitations or diaphoresis, but he does get slightly lightheaded.  He denies shortness of breath, orthopnea or PND. He denies leg swelling.  He also has HTN, hyperlipidemia, and on 07-16-2010, he was noted to have 50-69% proximal right ICA stenosis.  He was diagnosed with vitiligo approximately 3 years ago.  He works doing Art gallery manager work.  Allergies  Allergen Reactions  . Penicillins     REACTION: rash    Current Outpatient Prescriptions  Medication Sig Dispense Refill  . ALPRAZolam (XANAX) 0.5 MG tablet Take 0.5 mg by mouth at bedtime.        Marland Kitchen aspirin 325 MG EC tablet Take 325 mg by mouth daily.        . diclofenac (VOLTAREN) 75 MG EC tablet Take 75 mg by mouth 2 (two) times daily.        . fluticasone (FLONASE) 50 MCG/ACT nasal spray Place 2 sprays into the nose daily.        . folic acid (FOLVITE) 1 MG tablet TAKE ONE TABLET BY MOUTH EVERY DAY  30 tablet  6  . lisinopril (PRINIVIL,ZESTRIL) 20 MG tablet Take 20 mg by mouth daily.      Marland Kitchen loratadine (CLARITIN) 10 MG tablet Take 10 mg by mouth daily.        . Multiple Vitamin (MULTIVITAMIN) tablet Take 1 tablet by mouth daily.        . nitroGLYCERIN (NITROSTAT) 0.4 MG SL tablet Place 1 tablet (0.4 mg total)  under the tongue every 5 (five) minutes as needed.  25 tablet  0  . Omega-3 Fatty Acids (FISH OIL) 1200 MG CAPS Take 1 capsule by mouth 2 (two) times daily.        . polyethylene glycol (MIRALAX / GLYCOLAX) packet Take 17 g by mouth daily.        . simvastatin (ZOCOR) 40 MG tablet Take 40 mg by mouth at bedtime.        . triamterene-hydrochlorothiazide (MAXZIDE-25) 37.5-25 MG per tablet Take 1 tablet by mouth daily.         No current facility-administered medications for this visit.    Past Medical History  Diagnosis Date  . Chest pain     January 11,2001,catheterization feeling 90% stenosis in the LAD with otherwise nonobstructive disease. An EF of 55% with mid to distal anterior hypokinesis. The LAD was successfully stented with a bare-metal stent  . Hypertension   . Hyperlipidemia   . Osteoarthritis   . Low back pain   . Seasonal allergies   . OSA on CPAP   . Polysubstance abuse     use including marijuana and vicodin,which he obtained from the street  . CAD (coronary artery  disease)     there is a 82,001, status post stent to the RCA and mid LAD December 2011.  Followup Cardiolite study ejection fraction 54% with no reversible inferior defect 70-80% distal RCA not amenable to angioplasty.  Marland Kitchen PVC's (premature ventricular contractions)     status post Holter monitor normal sinus rhythm otherwise asymptomatic PVCs.  . Carotid artery disease     carotid Doppler course left side 5069% stenosis    No past surgical history on file.  History   Social History  . Marital Status: Divorced    Spouse Name: N/A    Number of Children: N/A  . Years of Education: N/A   Occupational History  .      works Armed forces operational officer employed   Social History Main Topics  . Smoking status: Former Smoker -- 1.00 packs/day for 35 years    Types: Cigarettes    Quit date: 06/08/2010  . Smokeless tobacco: Never Used  . Alcohol Use: Yes     Comment: drinks 6-8 ounces of liquor per day  . Drug Use:  Yes     Comment: smokes marijuana weekly and says he occasionally takes vicodin,which is not prescribed  . Sexual Activity: Not on file   Other Topics Concern  . Not on file   Social History Narrative   No exercise     Filed Vitals:   02/13/13 1338  Height: 5\' 8"  (1.727 m)  Weight: 256 lb (116.121 kg)   BP 172/87 Pulse 47    PHYSICAL EXAM General: NAD Neck: No JVD, no thyromegaly or thyroid nodule.  Lungs: Clear to auscultation bilaterally with normal respiratory effort. CV: Nondisplaced PMI.  Heart regular S1/S2, premature contractions noted, no S3/S4, no murmur.  No peripheral edema.  No carotid bruit.  Normal pedal pulses.  Abdomen: Soft, nontender, no hepatosplenomegaly, no distention.  Neurologic: Alert and oriented x 3.  Psych: Normal affect. Extremities: No clubbing or cyanosis.   ECG: reviewed and available in electronic records.      ASSESSMENT AND PLAN: 1. CAD: reduce ASA to 81 mg daily. Given his current symptoms, will obtain an echocardiogram and an exercise Myoview stress test. I will also start Imdur 30 mg daily. 2. HTN: uncontrolled. Increase Lisinopril to 40 mg daily. 3. Hyperlipidemia: it was reportedly ok at PCP's office recently. I will obtain these results. 4. Carotid artery stenosis: repeat Dopplers.   Martin [name], M.D., F.A.C.C.

## 2013-02-13 NOTE — Patient Instructions (Signed)
   Decrease Aspirin to 81mg  daily  Increase Lisinopril to 40mg  daily - new sent to pharm  Begin Imdur 30mg  daily Your physician has requested that you have an echocardiogram. Echocardiography is a painless test that uses sound waves to create images of your heart. It provides your doctor with information about the size and shape of your heart and how well your heart's chambers and valves are working. This procedure takes approximately one hour. There are no restrictions for this procedure. Your physician has requested that you have a carotid duplex. This test is an ultrasound of the carotid arteries in your neck. It looks at blood flow through these arteries that supply the brain with blood. Allow one hour for this exam. There are no restrictions or special instructions. Your physician has requested that you have en exercise stress myoview. For further information please visit [email or web address] Please follow instruction sheet, as given. Continue all other medications.   Office will contact with results via phone or letter.   Follow up in  3-4 weeks

## 2013-02-16 ENCOUNTER — Encounter (INDEPENDENT_AMBULATORY_CARE_PROVIDER_SITE_OTHER): Payer: BC Managed Care – PPO

## 2013-02-16 DIAGNOSIS — I6521 Occlusion and stenosis of right carotid artery: Secondary | ICD-10-CM

## 2013-02-16 DIAGNOSIS — I6529 Occlusion and stenosis of unspecified carotid artery: Secondary | ICD-10-CM

## 2013-02-20 ENCOUNTER — Encounter: Payer: Self-pay | Admitting: *Deleted

## 2013-02-22 ENCOUNTER — Other Ambulatory Visit: Payer: Self-pay | Admitting: *Deleted

## 2013-02-22 ENCOUNTER — Other Ambulatory Visit (INDEPENDENT_AMBULATORY_CARE_PROVIDER_SITE_OTHER): Payer: BC Managed Care – PPO

## 2013-02-22 ENCOUNTER — Other Ambulatory Visit: Payer: Self-pay

## 2013-02-22 DIAGNOSIS — R079 Chest pain, unspecified: Secondary | ICD-10-CM

## 2013-02-22 DIAGNOSIS — I2510 Atherosclerotic heart disease of native coronary artery without angina pectoris: Secondary | ICD-10-CM

## 2013-02-22 DIAGNOSIS — R0609 Other forms of dyspnea: Secondary | ICD-10-CM

## 2013-02-23 ENCOUNTER — Encounter: Payer: Self-pay | Admitting: *Deleted

## 2013-02-27 ENCOUNTER — Encounter (HOSPITAL_COMMUNITY)
Admission: RE | Admit: 2013-02-27 | Discharge: 2013-02-27 | Disposition: A | Discharge status: Home / Self Care | Payer: BC Managed Care – PPO | Source: Ambulatory Visit | Attending: Cardiovascular Disease | Admitting: Cardiovascular Disease

## 2013-02-27 ENCOUNTER — Encounter (HOSPITAL_COMMUNITY): Payer: Self-pay

## 2013-02-27 DIAGNOSIS — R079 Chest pain, unspecified: Secondary | ICD-10-CM

## 2013-02-27 DIAGNOSIS — R0602 Shortness of breath: Secondary | ICD-10-CM | POA: Insufficient documentation

## 2013-02-27 DIAGNOSIS — I2510 Atherosclerotic heart disease of native coronary artery without angina pectoris: Secondary | ICD-10-CM

## 2013-02-27 MED ORDER — SODIUM CHLORIDE 0.9 % IJ SOLN
INTRAMUSCULAR | Status: AC
  Administered 2013-02-27: 10 mL via INTRAVENOUS
  Filled 2013-02-27: qty 10

## 2013-02-27 MED ORDER — REGADENOSON 0.4 MG/5ML IV SOLN
INTRAVENOUS | Status: AC
  Administered 2013-02-27: 0.4 mg via INTRAVENOUS
  Filled 2013-02-27: qty 5

## 2013-02-27 MED ORDER — TECHNETIUM TC 99M SESTAMIBI - CARDIOLITE
10.0000 | Freq: Once | INTRAVENOUS | Status: AC | PRN
  Administered 2013-02-27: 09:00:00 10 via INTRAVENOUS

## 2013-02-27 MED ORDER — TECHNETIUM TC 99M SESTAMIBI GENERIC - CARDIOLITE
30.0000 | Freq: Once | INTRAVENOUS | Status: AC | PRN
  Administered 2013-02-27: 30 via INTRAVENOUS

## 2013-02-27 NOTE — Progress Notes (Signed)
Stress Lab Nurses Notes - Martin [name]  Martin [name] 02/27/2013 Reason for doing test: CAD and Chest Pain Type of test: Test Changed Unable to reach THR during stress test, Lexiscan given. Nurse performing test: Parke Poisson, RN Nuclear Medicine Tech: Lou Cal Echo Tech: Not Applicable MD performing test: Dr. Purvis Sheffield / Joni Reining NP Family MD: Dr. Margo Common Test explained and consent signed: yes IV started: 22g jelco, Saline lock flushed, No redness or edema and Saline lock started in radiology Symptoms: SOB & headache Treatment/Intervention: None Reason test stopped: fatigue After recovery IV was: Discontinued via X-ray tech and No redness or edema Patient to return to Nuc. Med at : 11:30 Patient discharged: Home Patient's Condition upon discharge was: stable Comments: During test peak BP 204/140 & HR 121.  Recovery BP 163/102 & HR 76.  Symptoms resolved in recovery. [name] T

## 2013-03-01 ENCOUNTER — Telehealth: Payer: Self-pay | Admitting: *Deleted

## 2013-03-01 NOTE — Telephone Encounter (Signed)
Pt left message stating her was returning a call

## 2013-03-01 NOTE — Telephone Encounter (Signed)
Patient returning your call.

## 2013-03-01 NOTE — Telephone Encounter (Signed)
Spoke to patient concerning lab/test results/instructions from provider. Patient understood.    

## 2013-03-01 NOTE — Telephone Encounter (Signed)
Left message to call office back

## 2013-03-20 ENCOUNTER — Ambulatory Visit (INDEPENDENT_AMBULATORY_CARE_PROVIDER_SITE_OTHER): Payer: BC Managed Care – PPO | Admitting: Cardiovascular Disease

## 2013-03-20 ENCOUNTER — Encounter: Payer: Self-pay | Admitting: Cardiovascular Disease

## 2013-03-20 VITALS — BP 168/95 | HR 64 | Ht 68.0 in | Wt 257.0 lb

## 2013-03-20 DIAGNOSIS — Z7182 Exercise counseling: Secondary | ICD-10-CM

## 2013-03-20 DIAGNOSIS — I10 Essential (primary) hypertension: Secondary | ICD-10-CM

## 2013-03-20 DIAGNOSIS — I779 Disorder of arteries and arterioles, unspecified: Secondary | ICD-10-CM

## 2013-03-20 DIAGNOSIS — E785 Hyperlipidemia, unspecified: Secondary | ICD-10-CM

## 2013-03-20 DIAGNOSIS — I2510 Atherosclerotic heart disease of native coronary artery without angina pectoris: Secondary | ICD-10-CM

## 2013-03-20 DIAGNOSIS — E669 Obesity, unspecified: Secondary | ICD-10-CM

## 2013-03-20 DIAGNOSIS — R079 Chest pain, unspecified: Secondary | ICD-10-CM

## 2013-03-20 NOTE — Progress Notes (Signed)
Patient ID: Martin [name], male   DOB: [birth year], 57 y.o.   MRN: [id number]      SUBJECTIVE: Martin [name] is here to f/u on the results of cardiovascular testing. His carotid Dopplers showed mild 1-39% bilateral ICA stenosis. His echocardiogram showed normal LV systolic function, EF 55-60%, indeterminate diastolic function, and moderate left atrial enlargement. His nuclear stress test showed evidence of scar but no inducible ischemia. He has a history of coronary artery disease with stent placement x 2 with drug-eluting platform of the RCA and mid LAD in November of 2011.   He believes his chest discomfort has improved since starting Imdur, although he has developed some constipation. He checks his BP at home and the SBP normally runs in the 130-140 mmHg range, and sometimes lower but never over 150 mmHg.  Overall, he fells well.    Allergies  Allergen Reactions  . Penicillins     REACTION: rash    Current Outpatient Prescriptions  Medication Sig Dispense Refill  . ALPRAZolam (XANAX) 0.5 MG tablet Take 0.5 mg by mouth at bedtime.        Marland Kitchen aspirin 81 MG EC tablet Take 1 tablet (81 mg total) by mouth daily.      . diclofenac (VOLTAREN) 75 MG EC tablet Take 75 mg by mouth 2 (two) times daily.        . fluticasone (FLONASE) 50 MCG/ACT nasal spray Place 2 sprays into the nose daily.        . folic acid (FOLVITE) 1 MG tablet TAKE ONE TABLET BY MOUTH EVERY DAY  30 tablet  6  . isosorbide mononitrate (IMDUR) 30 MG 24 hr tablet Take 1 tablet (30 mg total) by mouth daily.  30 tablet  6  . lisinopril (PRINIVIL,ZESTRIL) 40 MG tablet Take 1 tablet (40 mg total) by mouth daily.  30 tablet  6  . loratadine (CLARITIN) 10 MG tablet Take 10 mg by mouth daily.        . Multiple Vitamin (MULTIVITAMIN) tablet Take 1 tablet by mouth daily.        . nitroGLYCERIN (NITROSTAT) 0.4 MG SL tablet Place 1 tablet (0.4 mg total) under the tongue every 5 (five) minutes as needed.  25 tablet  0  . Omega-3 Fatty  Acids (FISH OIL) 1200 MG CAPS Take 1 capsule by mouth 2 (two) times daily.        . polyethylene glycol (MIRALAX / GLYCOLAX) packet Take 17 g by mouth daily.        . simvastatin (ZOCOR) 40 MG tablet Take 40 mg by mouth at bedtime.        . triamterene-hydrochlorothiazide (MAXZIDE-25) 37.5-25 MG per tablet Take 1 tablet by mouth daily.         No current facility-administered medications for this visit.    Past Medical History  Diagnosis Date  . Chest pain     January 11,2001,catheterization feeling 90% stenosis in the LAD with otherwise nonobstructive disease. An EF of 55% with mid to distal anterior hypokinesis. The LAD was successfully stented with a bare-metal stent  . Hypertension   . Hyperlipidemia   . Osteoarthritis   . Low back pain   . Seasonal allergies   . OSA on CPAP   . Polysubstance abuse     use including marijuana and vicodin,which he obtained from the street  . CAD (coronary artery disease)     there is a 82,001, status post stent to the RCA and mid LAD December 2011.  Followup Cardiolite study ejection fraction 54% with no reversible inferior defect 70-80% distal RCA not amenable to angioplasty.  Marland Kitchen PVC's (premature ventricular contractions)     status post Holter monitor normal sinus rhythm otherwise asymptomatic PVCs.  . Carotid artery disease     carotid Doppler course left side 5069% stenosis    No past surgical history on file.  History   Social History  . Marital Status: Divorced    Spouse Name: N/A    Number of Children: N/A  . Years of Education: N/A   Occupational History  .      works Armed forces operational officer employed   Social History Main Topics  . Smoking status: Former Smoker -- 1.00 packs/day for 35 years    Types: Cigarettes    Quit date: 06/08/2010  . Smokeless tobacco: Never Used  . Alcohol Use: Yes     Comment: drinks 6-8 ounces of liquor per day  . Drug Use: Yes     Comment: smokes marijuana weekly and says he occasionally takes  vicodin,which is not prescribed  . Sexual Activity: Not on file   Other Topics Concern  . Not on file   Social History Narrative   No exercise     Filed Vitals:   03/20/13 1434  BP: 168/95  Pulse: 64  Height: 5\' 8"  (1.727 m)  Weight: 257 lb (116.574 kg)    PHYSICAL EXAM General: NAD Neck: No JVD, no thyromegaly or thyroid nodule.  Lungs: Clear to auscultation bilaterally with normal respiratory effort. CV: Nondisplaced PMI.  Heart regular S1/S2, no S3/S4, no murmur.  No peripheral edema.  No carotid bruit.  Normal pedal pulses.  Abdomen: Soft, nontender, no hepatosplenomegaly, no distention.  Neurologic: Alert and oriented x 3.  Psych: Normal affect. Extremities: No clubbing or cyanosis.   ECG: reviewed and available in electronic records.  Nuclear stress test IMPRESSION: 1. Abnormal stress Myoview study.  2. Fixed defect seen in the inferoapical, mid-inferior, and basal inferior walls, indicative of myocardial scar. There is no evidence of ischemia.  3. Mild to moderately reduced LV systolic function, LVEF 41%, with mild to moderate hypokinesis of the aforementioned walls.      ASSESSMENT AND PLAN: 1. CAD: continue ASA 81 mg daily and Imdur 30 mg daily. If he continues to experience constipation, he will discontinue the Imdur. Again, no inducible ischemia seen with stress testing. I stressed the importance of risk factor modification and regular aerobic exercise in particular. 2. HTN: uncontrolled today, but reportedly normal at home. Continue lisinopril 40 mg daily. If it were to remain elevated, I would add amlodipine. He will continue to monitor it at home. 3. Hyperlipidemia: it was reportedly ok at PCP's office recently. I will review these results.  4. Carotid artery stenosis: mild 1-39% bilaterally. Repeat in 2 years.    Prentice Docker, M.D., F.A.C.C.

## 2013-03-20 NOTE — Patient Instructions (Signed)
Continue all current medications. May stop Imdur if have continued problems with constipation.   Your physician wants you to follow up in: 6 months.  You will receive a reminder letter in the mail one-two months in advance.  If you don't receive a letter, please call our office to schedule the follow up appointment

## 2013-08-15 ENCOUNTER — Encounter: Payer: Self-pay | Admitting: Cardiovascular Disease

## 2013-09-11 ENCOUNTER — Telehealth: Payer: Self-pay | Admitting: Cardiovascular Disease

## 2013-09-11 MED ORDER — LISINOPRIL 40 MG PO TABS: 40.0000 mg | ORAL_TABLET | Freq: Every day | ORAL | Status: DC

## 2013-09-11 NOTE — Telephone Encounter (Signed)
Patient aware, refill sent to pharm.

## 2013-09-11 NOTE — Telephone Encounter (Signed)
Needs refill on LISINOPRIL 40 MG PO TABS [place], [place] patient is out of his medication

## 2013-09-22 ENCOUNTER — Encounter: Payer: Self-pay | Admitting: Cardiovascular Disease

## 2013-09-22 ENCOUNTER — Ambulatory Visit (INDEPENDENT_AMBULATORY_CARE_PROVIDER_SITE_OTHER): Payer: BC Managed Care – PPO | Admitting: Cardiovascular Disease

## 2013-09-22 VITALS — BP 135/86 | HR 54 | Ht 68.0 in | Wt 263.0 lb

## 2013-09-22 DIAGNOSIS — I779 Disorder of arteries and arterioles, unspecified: Secondary | ICD-10-CM

## 2013-09-22 DIAGNOSIS — E785 Hyperlipidemia, unspecified: Secondary | ICD-10-CM

## 2013-09-22 DIAGNOSIS — G4730 Sleep apnea, unspecified: Secondary | ICD-10-CM

## 2013-09-22 DIAGNOSIS — I10 Essential (primary) hypertension: Secondary | ICD-10-CM

## 2013-09-22 DIAGNOSIS — I2510 Atherosclerotic heart disease of native coronary artery without angina pectoris: Secondary | ICD-10-CM

## 2013-09-22 DIAGNOSIS — I739 Peripheral vascular disease, unspecified: Secondary | ICD-10-CM

## 2013-09-22 DIAGNOSIS — Z7182 Exercise counseling: Secondary | ICD-10-CM

## 2013-09-22 NOTE — Patient Instructions (Addendum)
Your physician recommends that you schedule a follow-up appointment in: 6 months with Dr. Bronson Ing. You should receive a letter in the mail in 4 months. If you do not receive this letter by August 2015 call our office to schedule this appointment.   Your physician recommends that you continue on your current medications as directed. Please refer to the Current Medication list given to you today.  You have been referred to Dr. Redmond Pulling

## 2013-09-22 NOTE — Progress Notes (Signed)
Patient ID: Martin [name], male   DOB: 03/23/1956, 58 y.o.   MRN: [id number]      SUBJECTIVE: Martin [name] is here to followup for coronary artery disease, hypertension, hyperlipidemia and bilateral carotid artery stenosis. He had drug-eluting stents placed to the RCA and mid LAD in November 2011.  He is doing well and denies chest pain, shortness of breath, lightheadedness, dizziness, palpitations, orthopnea, paroxysmal nocturnal dyspnea and leg swelling. He would like to walk every day, but has only been walking twice a month and says he has put on some weight.     Allergies  Allergen Reactions  . Penicillins     REACTION: rash    Current Outpatient Prescriptions  Medication Sig Dispense Refill  . allopurinol (ZYLOPRIM) 300 MG tablet Take 300 mg by mouth daily.      Marland Kitchen ALPRAZolam (XANAX) 0.5 MG tablet Take 0.5 mg by mouth at bedtime.        Marland Kitchen aspirin 81 MG EC tablet Take 1 tablet (81 mg total) by mouth daily.      . Cholecalciferol (VITAMIN D-3) 1000 UNITS CAPS Take 1 capsule by mouth daily.      . diclofenac (VOLTAREN) 75 MG EC tablet Take 75 mg by mouth 2 (two) times daily.        . fluticasone (FLONASE) 50 MCG/ACT nasal spray Place 2 sprays into the nose daily.        . folic acid (FOLVITE) 1 MG tablet TAKE ONE TABLET BY MOUTH EVERY DAY  30 tablet  6  . lisinopril (PRINIVIL,ZESTRIL) 40 MG tablet Take 1 tablet (40 mg total) by mouth daily.  30 tablet  6  . loratadine (CLARITIN) 10 MG tablet Take 10 mg by mouth daily.        . Multiple Vitamin (MULTIVITAMIN) tablet Take 1 tablet by mouth daily.        . nitroGLYCERIN (NITROSTAT) 0.4 MG SL tablet Place 1 tablet (0.4 mg total) under the tongue every 5 (five) minutes as needed.  25 tablet  0  . Omega-3 Fatty Acids (FISH OIL) 1200 MG CAPS Take 1 capsule by mouth 2 (two) times daily.        . polyethylene glycol (MIRALAX / GLYCOLAX) packet Take 17 g by mouth daily.        . simvastatin (ZOCOR) 40 MG tablet Take 40 mg by mouth at  bedtime.        . triamterene-hydrochlorothiazide (MAXZIDE-25) 37.5-25 MG per tablet Take 1 tablet by mouth daily.         No current facility-administered medications for this visit.    Past Medical History  Diagnosis Date  . Chest pain     January 11,2001,catheterization feeling 90% stenosis in the LAD with otherwise nonobstructive disease. An EF of 55% with mid to distal anterior hypokinesis. The LAD was successfully stented with a bare-metal stent  . Hypertension   . Hyperlipidemia   . Osteoarthritis   . Low back pain   . Seasonal allergies   . OSA on CPAP   . Polysubstance abuse     use including marijuana and vicodin,which he obtained from the street  . CAD (coronary artery disease)     there is a 82,001, status post stent to the RCA and mid LAD December 2011.  Followup Cardiolite study ejection fraction 54% with no reversible inferior defect 70-80% distal RCA not amenable to angioplasty.  Marland Kitchen PVC's (premature ventricular contractions)     status post Holter monitor normal  sinus rhythm otherwise asymptomatic PVCs.  . Carotid artery disease     carotid Doppler course left side 5069% stenosis    No past surgical history on file.  History   Social History  . Marital Status: Married    Spouse Name: Martin [name]    Number of Children: Martin [name]  . Years of Education: Martin [name]   Occupational History  .      works Social worker employed   Social History Main Topics  . Smoking status: Former Smoker -- 1.00 packs/day for 35 years    Types: Cigarettes    Quit date: 06/08/2010  . Smokeless tobacco: Never Used  . Alcohol Use: Yes     Comment: drinks 6-8 ounces of liquor per day  . Drug Use: Yes     Comment: smokes marijuana weekly and says he occasionally takes vicodin,which is not prescribed  . Sexual Activity: Not on file   Other Topics Concern  . Not on file   Social History Narrative   No exercise     Filed Vitals:   09/22/13 0850  BP: 135/86  Pulse: 54  Height: 5\' 8"   (1.727 m)  Weight: 263 lb (119.296 kg)    PHYSICAL EXAM General: NAD Neck: No JVD, no thyromegaly. Lungs: Clear to auscultation bilaterally with normal respiratory effort. CV: Nondisplaced PMI.  Slightly bradycardic, regular rhythm, normal S1/S2, no S3/S4, no murmur. No pretibial or periankle edema.  No carotid bruit.  Normal pedal pulses.  Abdomen: Soft, nontender, no hepatosplenomegaly, no distention.  Neurologic: Alert and oriented x 3.  Psych: Normal affect. Extremities: No clubbing or cyanosis.   ECG: reviewed and available in electronic records.      ASSESSMENT AND PLAN: 1. CAD: Symptomatically stable. Continue ASA 81 mg daily and simvastatin 40 mg daily. He is no longer taking Imdur. I stressed the importance of risk factor modification and regular aerobic exercise in particular.  2. HTN: Well controlled on Maxzide and lisinopril. 3. Hyperlipidemia: He tells me it was recently checked by PCP and was normal. 4. Carotid artery stenosis: mild 1-39% bilaterally. Repeat in 2 years. 5. Sleep apnea: He said that he has not been sleeping well and requests a sleep study. He has CPAP but is uncertain if it is calibrated appropriately.  Dispo: f/u 6 months.  Kate Sable, M.D., F.A.C.C.

## 2014-03-27 ENCOUNTER — Ambulatory Visit: Payer: BC Managed Care – PPO | Admitting: Cardiovascular Disease

## 2014-03-30 ENCOUNTER — Ambulatory Visit: Payer: BC Managed Care – PPO | Admitting: Cardiovascular Disease

## 2014-04-02 ENCOUNTER — Ambulatory Visit (INDEPENDENT_AMBULATORY_CARE_PROVIDER_SITE_OTHER): Payer: BC Managed Care – PPO | Admitting: Cardiovascular Disease

## 2014-04-02 ENCOUNTER — Encounter: Payer: Self-pay | Admitting: Cardiovascular Disease

## 2014-04-02 VITALS — BP 157/82 | HR 133 | Ht 68.0 in | Wt 266.0 lb

## 2014-04-02 DIAGNOSIS — I779 Disorder of arteries and arterioles, unspecified: Secondary | ICD-10-CM

## 2014-04-02 DIAGNOSIS — G4730 Sleep apnea, unspecified: Secondary | ICD-10-CM

## 2014-04-02 DIAGNOSIS — E669 Obesity, unspecified: Secondary | ICD-10-CM

## 2014-04-02 DIAGNOSIS — I25811 Atherosclerosis of native coronary artery of transplanted heart without angina pectoris: Secondary | ICD-10-CM

## 2014-04-02 DIAGNOSIS — Z7182 Exercise counseling: Secondary | ICD-10-CM

## 2014-04-02 DIAGNOSIS — E785 Hyperlipidemia, unspecified: Secondary | ICD-10-CM

## 2014-04-02 DIAGNOSIS — Z719 Counseling, unspecified: Secondary | ICD-10-CM

## 2014-04-02 DIAGNOSIS — I739 Peripheral vascular disease, unspecified: Secondary | ICD-10-CM

## 2014-04-02 DIAGNOSIS — I10 Essential (primary) hypertension: Secondary | ICD-10-CM

## 2014-04-02 NOTE — Patient Instructions (Signed)
Your physician has requested that you regularly monitor and record your blood pressure readings at home. Please take at varied times of the day x next 6 weeks & return to office for MD review.   Continue all current medications. Your physician wants you to follow up in: 6 months.  You will receive a reminder letter in the mail one-two months in advance.  If you don't receive a letter, please call our office to schedule the follow up appointment

## 2014-04-02 NOTE — Progress Notes (Signed)
Patient ID: Martin [name], male   DOB: 1956-04-19, 58 y.o.   MRN: [id number]      SUBJECTIVE: [name] is here to followup for coronary artery disease, hypertension, hyperlipidemia and bilateral carotid artery stenosis. He had drug-eluting stents placed to the RCA and mid LAD in November 2011.  He is doing well and denies chest pain, shortness of breath, lightheadedness, dizziness, palpitations, orthopnea, paroxysmal nocturnal dyspnea and leg swelling. He has not been exercising. He has not had used nitroglycerin since his last visit. His blood pressure last week was 136/82 when he received his flu shot. He used to check his blood pressure regularly but not in the past several months.   Review of Systems: As per "subjective", otherwise negative.  Allergies  Allergen Reactions  . Penicillins     REACTION: rash    Current Outpatient Prescriptions  Medication Sig Dispense Refill  . allopurinol (ZYLOPRIM) 300 MG tablet Take 300 mg by mouth daily.      Marland Kitchen ALPRAZolam (XANAX) 0.5 MG tablet Take 0.5 mg by mouth at bedtime.        Marland Kitchen aspirin 81 MG EC tablet Take 1 tablet (81 mg total) by mouth daily.      . Cholecalciferol (VITAMIN D-3) 1000 UNITS CAPS Take 1 capsule by mouth daily.      . diclofenac (VOLTAREN) 75 MG EC tablet Take 75 mg by mouth 2 (two) times daily.        . fluticasone (FLONASE) 50 MCG/ACT nasal spray Place 2 sprays into the nose daily.        . folic acid (FOLVITE) 1 MG tablet TAKE ONE TABLET BY MOUTH EVERY DAY  30 tablet  6  . lisinopril (PRINIVIL,ZESTRIL) 40 MG tablet Take 1 tablet (40 mg total) by mouth daily.  30 tablet  6  . loratadine (CLARITIN) 10 MG tablet Take 10 mg by mouth daily.        . Multiple Vitamin (MULTIVITAMIN) tablet Take 1 tablet by mouth daily.        . nitroGLYCERIN (NITROSTAT) 0.4 MG SL tablet Place 1 tablet (0.4 mg total) under the tongue every 5 (five) minutes as needed.  25 tablet  0  . Omega-3 Fatty Acids (FISH OIL) 1200 MG CAPS Take 1  capsule by mouth 2 (two) times daily.        . polyethylene glycol (MIRALAX / GLYCOLAX) packet Take 17 g by mouth daily.        . simvastatin (ZOCOR) 40 MG tablet Take 40 mg by mouth at bedtime.        . triamterene-hydrochlorothiazide (MAXZIDE-25) 37.5-25 MG per tablet Take 1 tablet by mouth daily.         No current facility-administered medications for this visit.    Past Medical History  Diagnosis Date  . Chest pain     January 11,2001,catheterization feeling 90% stenosis in the LAD with otherwise nonobstructive disease. An EF of 55% with mid to distal anterior hypokinesis. The LAD was successfully stented with a bare-metal stent  . Hypertension   . Hyperlipidemia   . Osteoarthritis   . Low back pain   . Seasonal allergies   . OSA on CPAP   . Polysubstance abuse     use including marijuana and vicodin,which he obtained from the street  . CAD (coronary artery disease)     there is a 82,001, status post stent to the RCA and mid LAD December 2011.  Followup Cardiolite study ejection fraction 54%  with no reversible inferior defect 70-80% distal RCA not amenable to angioplasty.  Marland Kitchen PVC's (premature ventricular contractions)     status post Holter monitor normal sinus rhythm otherwise asymptomatic PVCs.  . Carotid artery disease     carotid Doppler course left side 5069% stenosis    No past surgical history on file.  History   Social History  . Marital Status: Married    Spouse Name: N/A    Number of Children: N/A  . Years of Education: N/A   Occupational History  .      works Social worker employed   Social History Main Topics  . Smoking status: Former Smoker -- 1.00 packs/day for 35 years    Types: Cigarettes    Quit date: 06/08/2010  . Smokeless tobacco: Never Used  . Alcohol Use: Yes     Comment: drinks 6-8 ounces of liquor per day  . Drug Use: Yes     Comment: smokes marijuana weekly and says he occasionally takes vicodin,which is not prescribed  . Sexual  Activity: Not on file   Other Topics Concern  . Not on file   Social History Narrative   No exercise    BP 157/82  Pulse 133 (HR by auscultation 60-65 bpm) SpO2 98% Weight 266 lb (120.657 kg) Height 5\' 8"  (1.727 m)    PHYSICAL EXAM General: NAD, obese HEENT: Normal. Neck: No JVD, no thyromegaly. Lungs: Clear to auscultation bilaterally with normal respiratory effort. CV: Nondisplaced PMI.  Regular rate and rhythm, normal S1/S2, no S3/S4, no murmur. No pretibial or periankle edema.  Stasis dermatitis of lower extremities. No carotid bruit.  Normal pedal pulses.  Abdomen: Soft, nontender, obese, no hepatosplenomegaly, no distention.  Neurologic: Alert and oriented x 3.  Psych: Normal affect. Skin: Normal. Musculoskeletal: Normal range of motion, no gross deformities. Extremities: No clubbing or cyanosis.   ECG: Most recent ECG reviewed.    ASSESSMENT AND PLAN: 1. CAD: Stable ischemic heart disease. Continue ASA 81 mg daily and simvastatin 40 mg daily. I again stressed the importance of risk factor modification and regular aerobic exercise in particular.  2. Essential HTN: Poorly controlled today on Maxzide and lisinopril. However, he said it was normal last week and at his PCP's office. I have asked him to check it 3-4 times per week at home for the next six weeks and to inform me of these values. 3. Hyperlipidemia: Will obtain copy of lipids from PCP's office. Continue simvastatin 40 mg daily. 4. Carotid artery stenosis: Mild 1-39% bilaterally. Repeat in 1-2 years.  5. Sleep apnea: Stable, on CPAP.  Dispo: f/u 6 months.  Kate Sable, M.D., F.A.C.C.

## 2014-04-13 ENCOUNTER — Telehealth: Payer: Self-pay | Admitting: *Deleted

## 2014-04-13 MED ORDER — LISINOPRIL 40 MG PO TABS: 40.0000 mg | ORAL_TABLET | Freq: Every day | ORAL | Status: DC

## 2014-04-13 NOTE — Telephone Encounter (Signed)
Refill sent.

## 2014-06-01 ENCOUNTER — Emergency Department (HOSPITAL_COMMUNITY): Payer: BC Managed Care – PPO

## 2014-06-01 ENCOUNTER — Emergency Department (HOSPITAL_COMMUNITY)
Admission: EM | Admit: 2014-06-01 | Discharge: 2014-06-01 | Disposition: A | Discharge status: Home / Self Care | Payer: BC Managed Care – PPO | Attending: Emergency Medicine | Admitting: Emergency Medicine

## 2014-06-01 ENCOUNTER — Encounter (HOSPITAL_COMMUNITY): Payer: Self-pay | Admitting: Emergency Medicine

## 2014-06-01 DIAGNOSIS — Z79899 Other long term (current) drug therapy: Secondary | ICD-10-CM | POA: Insufficient documentation

## 2014-06-01 DIAGNOSIS — Z9981 Dependence on supplemental oxygen: Secondary | ICD-10-CM | POA: Diagnosis not present

## 2014-06-01 DIAGNOSIS — Z7982 Long term (current) use of aspirin: Secondary | ICD-10-CM | POA: Insufficient documentation

## 2014-06-01 DIAGNOSIS — E785 Hyperlipidemia, unspecified: Secondary | ICD-10-CM | POA: Diagnosis not present

## 2014-06-01 DIAGNOSIS — I2510 Atherosclerotic heart disease of native coronary artery without angina pectoris: Secondary | ICD-10-CM | POA: Diagnosis not present

## 2014-06-01 DIAGNOSIS — Z791 Long term (current) use of non-steroidal anti-inflammatories (NSAID): Secondary | ICD-10-CM | POA: Diagnosis not present

## 2014-06-01 DIAGNOSIS — Z880 Allergy status to penicillin: Secondary | ICD-10-CM | POA: Insufficient documentation

## 2014-06-01 DIAGNOSIS — L089 Local infection of the skin and subcutaneous tissue, unspecified: Secondary | ICD-10-CM | POA: Diagnosis not present

## 2014-06-01 DIAGNOSIS — Z87891 Personal history of nicotine dependence: Secondary | ICD-10-CM | POA: Diagnosis not present

## 2014-06-01 DIAGNOSIS — L03011 Cellulitis of right finger: Secondary | ICD-10-CM | POA: Insufficient documentation

## 2014-06-01 DIAGNOSIS — G4733 Obstructive sleep apnea (adult) (pediatric): Secondary | ICD-10-CM | POA: Diagnosis not present

## 2014-06-01 DIAGNOSIS — Z7951 Long term (current) use of inhaled steroids: Secondary | ICD-10-CM | POA: Insufficient documentation

## 2014-06-01 DIAGNOSIS — Z9861 Coronary angioplasty status: Secondary | ICD-10-CM | POA: Diagnosis not present

## 2014-06-01 DIAGNOSIS — B999 Unspecified infectious disease: Secondary | ICD-10-CM

## 2014-06-01 DIAGNOSIS — M1990 Unspecified osteoarthritis, unspecified site: Secondary | ICD-10-CM | POA: Insufficient documentation

## 2014-06-01 DIAGNOSIS — I10 Essential (primary) hypertension: Secondary | ICD-10-CM | POA: Diagnosis not present

## 2014-06-01 LAB — CBC WITH DIFFERENTIAL/PLATELET
Basophils Absolute: 0 10*3/uL (ref 0.0–0.1)
Basophils Relative: 0 % (ref 0–1)
EOS ABS: 0.1 10*3/uL (ref 0.0–0.7)
Eosinophils Relative: 2 % (ref 0–5)
HCT: 39.2 % (ref 39.0–52.0)
Hemoglobin: 13 g/dL (ref 13.0–17.0)
Lymphocytes Relative: 28 % (ref 12–46)
Lymphs Abs: 2.1 10*3/uL (ref 0.7–4.0)
MCH: 32.7 pg (ref 26.0–34.0)
MCHC: 33.2 g/dL (ref 30.0–36.0)
MCV: 98.7 fL (ref 78.0–100.0)
MONOS PCT: 8 % (ref 3–12)
Monocytes Absolute: 0.6 10*3/uL (ref 0.1–1.0)
Neutro Abs: 4.6 10*3/uL (ref 1.7–7.7)
Neutrophils Relative %: 62 % (ref 43–77)
Platelets: 148 10*3/uL — ABNORMAL LOW (ref 150–400)
RBC: 3.97 MIL/uL — ABNORMAL LOW (ref 4.22–5.81)
RDW: 13.2 % (ref 11.5–15.5)
WBC: 7.5 10*3/uL (ref 4.0–10.5)

## 2014-06-01 LAB — BASIC METABOLIC PANEL
ANION GAP: 5 (ref 5–15)
BUN: 28 mg/dL — ABNORMAL HIGH (ref 6–23)
CHLORIDE: 112 meq/L (ref 96–112)
CO2: 24 mmol/L (ref 19–32)
Calcium: 8.8 mg/dL (ref 8.4–10.5)
Creatinine, Ser: 1.21 mg/dL (ref 0.50–1.35)
GFR calc Af Amer: 73 mL/min — ABNORMAL LOW (ref >90)
GFR calc non Af Amer: 63 mL/min — ABNORMAL LOW (ref >90)
GLUCOSE: 98 mg/dL (ref 70–99)
Potassium: 4.3 mmol/L (ref 3.5–5.1)
SODIUM: 141 mmol/L (ref 135–145)

## 2014-06-01 MED ORDER — TRAMADOL HCL 50 MG PO TABS: 50.0000 mg | ORAL_TABLET | Freq: Four times a day (QID) | ORAL | Status: DC | PRN

## 2014-06-01 MED ORDER — CLINDAMYCIN PHOSPHATE 900 MG/50ML IV SOLN
900.0000 mg | Freq: Once | INTRAVENOUS | Status: AC
  Administered 2014-06-01: 900 mg via INTRAVENOUS
  Filled 2014-06-01: qty 50

## 2014-06-01 MED ORDER — CLINDAMYCIN HCL 300 MG PO CAPS: 300.0000 mg | ORAL_CAPSULE | Freq: Four times a day (QID) | ORAL | Status: DC

## 2014-06-01 MED ORDER — SODIUM CHLORIDE 0.9 % IV BOLUS (SEPSIS)
1000.0000 mL | Freq: Once | INTRAVENOUS | Status: AC
  Administered 2014-06-01: 1000 mL via INTRAVENOUS

## 2014-06-01 NOTE — ED Notes (Signed)
PT noticed small open area to right hand index finger x2 days ago and swelling/redness and pain has increased to RUE x2 days.

## 2014-06-01 NOTE — Discharge Instructions (Signed)

## 2014-06-03 ENCOUNTER — Emergency Department (HOSPITAL_COMMUNITY)
Admission: EM | Admit: 2014-06-03 | Discharge: 2014-06-03 | Disposition: A | Discharge status: Home / Self Care | Payer: BC Managed Care – PPO | Attending: Emergency Medicine | Admitting: Emergency Medicine

## 2014-06-03 ENCOUNTER — Encounter (HOSPITAL_COMMUNITY): Payer: Self-pay | Admitting: Emergency Medicine

## 2014-06-03 DIAGNOSIS — I2510 Atherosclerotic heart disease of native coronary artery without angina pectoris: Secondary | ICD-10-CM | POA: Insufficient documentation

## 2014-06-03 DIAGNOSIS — G4733 Obstructive sleep apnea (adult) (pediatric): Secondary | ICD-10-CM | POA: Diagnosis not present

## 2014-06-03 DIAGNOSIS — Z87891 Personal history of nicotine dependence: Secondary | ICD-10-CM | POA: Insufficient documentation

## 2014-06-03 DIAGNOSIS — Z79899 Other long term (current) drug therapy: Secondary | ICD-10-CM | POA: Insufficient documentation

## 2014-06-03 DIAGNOSIS — I10 Essential (primary) hypertension: Secondary | ICD-10-CM | POA: Insufficient documentation

## 2014-06-03 DIAGNOSIS — Z7982 Long term (current) use of aspirin: Secondary | ICD-10-CM | POA: Insufficient documentation

## 2014-06-03 DIAGNOSIS — E785 Hyperlipidemia, unspecified: Secondary | ICD-10-CM | POA: Diagnosis not present

## 2014-06-03 DIAGNOSIS — Z8709 Personal history of other diseases of the respiratory system: Secondary | ICD-10-CM | POA: Insufficient documentation

## 2014-06-03 DIAGNOSIS — M1990 Unspecified osteoarthritis, unspecified site: Secondary | ICD-10-CM | POA: Insufficient documentation

## 2014-06-03 DIAGNOSIS — Z880 Allergy status to penicillin: Secondary | ICD-10-CM | POA: Diagnosis not present

## 2014-06-03 DIAGNOSIS — L02511 Cutaneous abscess of right hand: Secondary | ICD-10-CM | POA: Diagnosis not present

## 2014-06-03 DIAGNOSIS — Z9981 Dependence on supplemental oxygen: Secondary | ICD-10-CM | POA: Insufficient documentation

## 2014-06-03 MED ORDER — OXYCODONE-ACETAMINOPHEN 5-325 MG PO TABS: 1.0000 | ORAL_TABLET | ORAL | Status: DC | PRN

## 2014-06-03 MED ORDER — LIDOCAINE HCL (PF) 1 % IJ SOLN
5.0000 mL | Freq: Once | INTRAMUSCULAR | Status: AC
  Administered 2014-06-03: 5 mL
  Filled 2014-06-03: qty 5

## 2014-06-03 NOTE — ED Provider Notes (Signed)
CSN: [id number]     Arrival date & time 06/01/14  1042 History   First MD Initiated Contact with Patient 06/01/14 1110     Chief Complaint  Patient presents with  . Cellulitis     (Consider location/radiation/quality/duration/timing/severity/associated sxs/prior Treatment) HPI  Martin [name] is a 58 y.o. male who presents to the Emergency Department complaining of pain, redness of the right hand and index finger.  He states the symptoms have been present for 2 days.  He states that he was working on an Building [employer] and believes that he may have injured his finger or had a piece of metal in his finger.  He reports squeezing on the finger without drainage.  He reports increasing redness to his hand.  He denies fever, chills, numbness or weakness of his hand.  He has not tried other therapies    Past Medical History  Diagnosis Date  . Chest pain     January 11,2001,catheterization feeling 90% stenosis in the LAD with otherwise nonobstructive disease. An EF of 55% with mid to distal anterior hypokinesis. The LAD was successfully stented with a bare-metal stent  . Hypertension   . Hyperlipidemia   . Osteoarthritis   . Low back pain   . Seasonal allergies   . OSA on CPAP   . Polysubstance abuse     use including marijuana and vicodin,which he obtained from the street  . CAD (coronary artery disease)     there is a 82,001, status post stent to the RCA and mid LAD December 2011.  Followup Cardiolite study ejection fraction 54% with no reversible inferior defect 70-80% distal RCA not amenable to angioplasty.  Marland Kitchen PVC's (premature ventricular contractions)     status post Holter monitor normal sinus rhythm otherwise asymptomatic PVCs.  . Carotid artery disease     carotid Doppler course left side 5069% stenosis   Past Surgical History  Procedure Laterality Date  . Coronary angioplasty with stent placement  2011  . Tonsillectomy     Family History  Problem Relation Age of Onset  .  Stroke Mother   . Heart attack Father   . Heart attack Brother   . Other Mother     small bowel obstruction   History  Substance Use Topics  . Smoking status: Former Smoker -- 1.00 packs/day for 35 years    Types: Cigarettes    Quit date: 06/09/1999  . Smokeless tobacco: Never Used  . Alcohol Use: Yes     Comment: drinks 6-8 ounces of liquor per day    Review of Systems  Constitutional: Negative for fever and chills.  Gastrointestinal: Negative for nausea and vomiting.  Musculoskeletal: Positive for arthralgias (pain to right index finger).  Skin: Positive for color change and wound.  Neurological: Negative for dizziness, weakness and numbness.  Hematological: Negative for adenopathy.  All other systems reviewed and are negative.     Allergies  Penicillins  Home Medications   Prior to Admission medications   Medication Sig Start Date End Date Taking? Authorizing Provider  allopurinol (ZYLOPRIM) 300 MG tablet Take 300 mg by mouth daily.   Yes Historical Provider, MD  ALPRAZolam Duanne Moron) 0.5 MG tablet Take 0.5 mg by mouth at bedtime.     Yes Historical Provider, MD  aspirin 81 MG EC tablet Take 1 tablet (81 mg total) by mouth daily. 02/13/13  Yes Herminio Commons, MD  Cholecalciferol (VITAMIN D-3) 1000 UNITS CAPS Take 1 capsule by mouth daily.   Yes  Historical Provider, MD  diclofenac (VOLTAREN) 75 MG EC tablet Take 75 mg by mouth 2 (two) times daily.     Yes Historical Provider, MD  fluticasone (FLONASE) 50 MCG/ACT nasal spray Place 2 sprays into the nose daily.     Yes Historical Provider, MD  folic acid (FOLVITE) 1 MG tablet TAKE ONE TABLET BY MOUTH EVERY DAY 02/07/12  Yes Ezra Sites, MD  lisinopril (PRINIVIL,ZESTRIL) 40 MG tablet Take 1 tablet (40 mg total) by mouth daily. 04/13/14  Yes Herminio Commons, MD  loratadine (CLARITIN) 10 MG tablet Take 10 mg by mouth daily.     Yes Historical Provider, MD  magnesium oxide (MAG-OX) 400 MG tablet Take 400 mg by mouth daily.    Yes Historical Provider, MD  Multiple Vitamin (MULTIVITAMIN) tablet Take 1 tablet by mouth daily.     Yes Historical Provider, MD  Omega-3 Fatty Acids (FISH OIL) 1200 MG CAPS Take 1 capsule by mouth 2 (two) times daily.     Yes Historical Provider, MD  simvastatin (ZOCOR) 40 MG tablet Take 40 mg by mouth at bedtime.     Yes Historical Provider, MD  triamterene-hydrochlorothiazide (MAXZIDE-25) 37.5-25 MG per tablet Take 1 tablet by mouth daily.     Yes Historical Provider, MD  clindamycin (CLEOCIN) 300 MG capsule Take 1 capsule (300 mg total) by mouth 4 (four) times daily. For 10 days 06/01/14   Addilee Neu L. Tyeasha Ebbs, PA-C  nitroGLYCERIN (NITROSTAT) 0.4 MG SL tablet Place 1 tablet (0.4 mg total) under the tongue every 5 (five) minutes as needed. 08/19/12   Minus Breeding, MD  oxyCODONE-acetaminophen (PERCOCET/ROXICET) 5-325 MG per tablet Take 1 tablet by mouth every 4 (four) hours as needed for severe pain. 06/03/14   Sharyon Cable, MD  polyethylene glycol [facility] / Floria Raveling) packet Take 17 g by mouth daily as needed for mild constipation.     Historical Provider, MD   BP 159/82 mmHg  Pulse 55  Temp(Src) 98 F (36.7 C) (Oral)  Resp 16  Ht 5\' 8"  (1.727 m)  Wt 260 lb (117.935 kg)  BMI 39.54 kg/m2  SpO2 99% Physical Exam  Constitutional: He is oriented to person, place, and time. He appears well-developed and well-nourished. No distress.  HENT:  Head: Normocephalic and atraumatic.  Cardiovascular: Normal rate, regular rhythm, normal heart sounds and intact distal pulses.   No murmur heard. Pulmonary/Chest: Effort normal and breath sounds normal. No respiratory distress.  Musculoskeletal: He exhibits edema and tenderness.  Small open wound to the proximal right index finger with surrounding erythema and mild STS of the dorsal hand.  Erythema extends to the wrist.  Radial pulse brisk, distal sensation intact, CR< 2 sec.  Pt has full ROM of the finger.  No drainage  Neurological: He is alert  and oriented to person, place, and time. He exhibits normal muscle tone. Coordination normal.  Skin: Skin is warm and dry.  Nursing note and vitals reviewed.   ED Course  Procedures (including critical care time) Labs Review Labs Reviewed  CBC WITH DIFFERENTIAL - Abnormal; Notable for the following:    RBC 3.97 (*)    Platelets 148 (*)    All other components within normal limits  BASIC METABOLIC PANEL - Abnormal; Notable for the following:    BUN 28 (*)    GFR calc non Af Amer 63 (*)    GFR calc Af Amer 73 (*)    All other components within normal limits    Imaging  Review Dg Hand Complete Right  06/01/2014   CLINICAL DATA:  Right second digit pain and swelling for 2 days, initial encounter  EXAM: RIGHT HAND - COMPLETE 3+ VIEW  COMPARISON:  None.  FINDINGS: Significant soft tissue swelling of the second digit is noted consistent with the patient's given clinical history. No acute fracture or dislocation is seen. No bony erosive changes are noted.  IMPRESSION: Soft tissue swelling of the second digit. No other focal abnormality is noted.   Electronically Signed   By: Inez Catalina M.D.   On: 06/01/2014 11:58     EKG Interpretation None      MDM   Final diagnoses:  Infection  Cellulitis of finger of right hand    Pt with cellulitis of the finger, remains NV intact.  ROM preserved at this time.  No definite abscess.  Leading edge of erythema was marked.    Dr. Aline Brochure in the ED for another pateint and pt was also evaluated by Dr. Aline Brochure while in the dept.  He has recommended IV clindamycin here and Rx and to have pt return here on Sunday for recheck of the finger and recommended that he be contacted if patient returns and sx's are worse.  Pt agrees to this plan and will return for recheck.      Ehan Freas L. Vanessa [place], PA-C 06/03/14 [place], DO 06/04/14 [phone]

## 2014-06-03 NOTE — Discharge Instructions (Signed)

## 2014-06-03 NOTE — ED Provider Notes (Signed)
CSN: [id number]     Arrival date & time 06/03/14  7124 History  This chart was scribed for Sharyon Cable, MD by Stephania Fragmin, ED Scribe. This patient was seen in room APA01/APA01 and the patient's care was started at 8:26 AM.    Chief Complaint  Patient presents with  . Wound Check    The history is provided by the patient. No language interpreter was used.     Patient gave verbal permission to utilize photo for medical documentation only The image was not stored on any personal device  HPI Comments: Martin [name] is a 58 y.o. male who presents to the Emergency Department for a wound check for an abscess on his right hand. He was seen here in the ED in the past several days, where an XR showed no metal in the wound, he was told his wound might be a possible insect bite, and he was given antibiotics (penicillin), which he has taken. He reports that the swelling on his hand has decreased after treatment, although he still complains of associated, constant, throbbing pain. He was prescribed Tramadol, which has been ineffective. Patient does metalwork.   Past Medical History  Diagnosis Date  . Chest pain     January 11,2001,catheterization feeling 90% stenosis in the LAD with otherwise nonobstructive disease. An EF of 55% with mid to distal anterior hypokinesis. The LAD was successfully stented with a bare-metal stent  . Hypertension   . Hyperlipidemia   . Osteoarthritis   . Low back pain   . Seasonal allergies   . OSA on CPAP   . Polysubstance abuse     use including marijuana and vicodin,which he obtained from the street  . CAD (coronary artery disease)     there is a 82,001, status post stent to the RCA and mid LAD December 2011.  Followup Cardiolite study ejection fraction 54% with no reversible inferior defect 70-80% distal RCA not amenable to angioplasty.  Marland Kitchen PVC's (premature ventricular contractions)     status post Holter monitor normal sinus rhythm otherwise asymptomatic  PVCs.  . Carotid artery disease     carotid Doppler course left side 5069% stenosis   Past Surgical History  Procedure Laterality Date  . Coronary angioplasty with stent placement  2011  . Tonsillectomy     Family History  Problem Relation Age of Onset  . Stroke Mother   . Heart attack Father   . Heart attack Brother   . Other Mother     small bowel obstruction   History  Substance Use Topics  . Smoking status: Former Smoker -- 1.00 packs/day for 35 years    Types: Cigarettes    Quit date: 06/09/1999  . Smokeless tobacco: Never Used  . Alcohol Use: Yes     Comment: drinks 6-8 ounces of liquor per day    Review of Systems  Constitutional: Negative for fever.  Skin: Positive for wound.  Neurological: Negative for weakness.      Allergies  Penicillins  Home Medications   Prior to Admission medications   Medication Sig Start Date End Date Taking? Authorizing Provider  allopurinol (ZYLOPRIM) 300 MG tablet Take 300 mg by mouth daily.    Historical Provider, MD  ALPRAZolam Duanne Moron) 0.5 MG tablet Take 0.5 mg by mouth at bedtime.      Historical Provider, MD  aspirin 81 MG EC tablet Take 1 tablet (81 mg total) by mouth daily. 02/13/13   Herminio Commons, MD  Cholecalciferol (  VITAMIN D-3) 1000 UNITS CAPS Take 1 capsule by mouth daily.    Historical Provider, MD  clindamycin (CLEOCIN) 300 MG capsule Take 1 capsule (300 mg total) by mouth 4 (four) times daily. For 10 days 06/01/14   Tammy L. Triplett, PA-C  diclofenac (VOLTAREN) 75 MG EC tablet Take 75 mg by mouth 2 (two) times daily.      Historical Provider, MD  fluticasone (FLONASE) 50 MCG/ACT nasal spray Place 2 sprays into the nose daily.      Historical Provider, MD  folic acid (FOLVITE) 1 MG tablet TAKE ONE TABLET BY MOUTH EVERY DAY 02/07/12   Ezra Sites, MD  lisinopril (PRINIVIL,ZESTRIL) 40 MG tablet Take 1 tablet (40 mg total) by mouth daily. 04/13/14   Herminio Commons, MD  loratadine (CLARITIN) 10 MG tablet Take  10 mg by mouth daily.      Historical Provider, MD  magnesium oxide (MAG-OX) 400 MG tablet Take 400 mg by mouth daily.    Historical Provider, MD  Multiple Vitamin (MULTIVITAMIN) tablet Take 1 tablet by mouth daily.      Historical Provider, MD  nitroGLYCERIN (NITROSTAT) 0.4 MG SL tablet Place 1 tablet (0.4 mg total) under the tongue every 5 (five) minutes as needed. 08/19/12   Minus Breeding, MD  Omega-3 Fatty Acids (FISH OIL) 1200 MG CAPS Take 1 capsule by mouth 2 (two) times daily.      Historical Provider, MD  polyethylene glycol (MIRALAX / GLYCOLAX) packet Take 17 g by mouth daily as needed for mild constipation.     Historical Provider, MD  simvastatin (ZOCOR) 40 MG tablet Take 40 mg by mouth at bedtime.      Historical Provider, MD  traMADol (ULTRAM) 50 MG tablet Take 1 tablet (50 mg total) by mouth every 6 (six) hours as needed. 06/01/14   Tammy L. Triplett, PA-C  triamterene-hydrochlorothiazide (MAXZIDE-25) 37.5-25 MG per tablet Take 1 tablet by mouth daily.      Historical Provider, MD   BP 166/87 mmHg  Pulse 60  Temp(Src) 97.8 F (36.6 C) (Oral)  Resp 18  Ht 5\' 8"  (1.727 m)  Wt 260 lb (117.935 kg)  BMI 39.54 kg/m2  SpO2 98% Physical Exam  Nursing note and vitals reviewed. CONSTITUTIONAL: Well developed/well nourished HEAD: Normocephalic/atraumatic ENMT: Mucous membranes moist NECK: supple no meningeal signs SPINE/BACK:entire spine nontender NEURO: Pt is awake/alert/appropriate, moves all extremitiesx4.  No facial droop.   EXTREMITIES: pulses normal/equal, full ROM, see photo.  Small abscess to abscess.  No crepitus SKIN: warm, color normal PSYCH: no abnormalities of mood noted, alert and oriented to situation      ED Course  Procedures (including critical care time)  DIAGNOSTIC STUDIES: Oxygen Saturation is 98% on room air, normal by my interpretation.    COORDINATION OF CARE: 8:27 AM - Discussed treatment plan with pt at bedside which includes I&D and pt agreed to  plan.   INCISION AND DRAINAGE PROCEDURE NOTE: Patient identification was confirmed and verbal consent was obtained. This procedure was performed by Sharyon Cable, MD at 8:27 AM. Site: right index finger Sterile procedures observed  Anesthetic used lidocaine Blade size: 11 Drainage: minimal Complexity: Complex Packing used none Site anesthetized, incision made over site, wound drained and explored loculations, rinsed with copious amounts of normal saline, wound packed with sterile gauze, covered with dry, sterile dressing.  Pt tolerated procedure well without complications.  Instructions for care discussed verbally and pt provided with additional written instructions for homecare and  f/u.  Imaging Review Dg Hand Complete Right  06/01/2014   CLINICAL DATA:  Right second digit pain and swelling for 2 days, initial encounter  EXAM: RIGHT HAND - COMPLETE 3+ VIEW  COMPARISON:  None.  FINDINGS: Significant soft tissue swelling of the second digit is noted consistent with the patient's given clinical history. No acute fracture or dislocation is seen. No bony erosive changes are noted.  IMPRESSION: Soft tissue swelling of the second digit. No other focal abnormality is noted.   Electronically Signed   By: Inez Catalina M.D.   On: 06/01/2014 11:58    No pus extracted from small abscess on finger.  No tendon injury, he has full flex/extension of finger after procedure.  Advised wound care, continue antibiotics Stop tramadol and start percocet It appears the erythema to hand is improved from recent ED visit Advised to call ortho Hand if no improvement over next 24 hours We discussed strict return precautions    MDM   Final diagnoses:  None     I personally performed the services described in this documentation, which was scribed in my presence. The recorded information has been reviewed and is accurate.      Sharyon Cable, MD 06/03/14 [phone]

## 2014-06-03 NOTE — ED Notes (Signed)
Pt alert & oriented x4, stable gait. Patient given discharge instructions, paperwork & prescription(s). Patient  instructed to stop at the registration desk to finish any additional paperwork. Patient verbalized understanding. Pt left department w/ no further questions. 

## 2014-06-03 NOTE — ED Notes (Signed)
Medication and I&D tray set up at bedside.

## 2014-06-03 NOTE — ED Notes (Signed)
PT was here on 06/01/14 for abscesses to right hand index finger. PT reports some decreased in redness and swelling. PT reports taking antibiotics as scheduled.

## 2014-06-05 ENCOUNTER — Encounter (HOSPITAL_COMMUNITY): Payer: Self-pay | Admitting: Emergency Medicine

## 2014-06-05 ENCOUNTER — Emergency Department (HOSPITAL_COMMUNITY)
Admission: EM | Admit: 2014-06-05 | Discharge: 2014-06-05 | Disposition: A | Discharge status: Home / Self Care | Payer: BC Managed Care – PPO | Attending: Emergency Medicine | Admitting: Emergency Medicine

## 2014-06-05 DIAGNOSIS — Z7951 Long term (current) use of inhaled steroids: Secondary | ICD-10-CM | POA: Diagnosis not present

## 2014-06-05 DIAGNOSIS — I2510 Atherosclerotic heart disease of native coronary artery without angina pectoris: Secondary | ICD-10-CM | POA: Diagnosis not present

## 2014-06-05 DIAGNOSIS — Z791 Long term (current) use of non-steroidal anti-inflammatories (NSAID): Secondary | ICD-10-CM | POA: Diagnosis not present

## 2014-06-05 DIAGNOSIS — G4733 Obstructive sleep apnea (adult) (pediatric): Secondary | ICD-10-CM | POA: Insufficient documentation

## 2014-06-05 DIAGNOSIS — M1990 Unspecified osteoarthritis, unspecified site: Secondary | ICD-10-CM | POA: Insufficient documentation

## 2014-06-05 DIAGNOSIS — Z7982 Long term (current) use of aspirin: Secondary | ICD-10-CM | POA: Insufficient documentation

## 2014-06-05 DIAGNOSIS — Z880 Allergy status to penicillin: Secondary | ICD-10-CM | POA: Diagnosis not present

## 2014-06-05 DIAGNOSIS — Z87891 Personal history of nicotine dependence: Secondary | ICD-10-CM | POA: Diagnosis not present

## 2014-06-05 DIAGNOSIS — L02511 Cutaneous abscess of right hand: Secondary | ICD-10-CM | POA: Diagnosis not present

## 2014-06-05 DIAGNOSIS — E785 Hyperlipidemia, unspecified: Secondary | ICD-10-CM | POA: Diagnosis not present

## 2014-06-05 DIAGNOSIS — Z79899 Other long term (current) drug therapy: Secondary | ICD-10-CM | POA: Insufficient documentation

## 2014-06-05 DIAGNOSIS — I10 Essential (primary) hypertension: Secondary | ICD-10-CM | POA: Insufficient documentation

## 2014-06-05 NOTE — ED Provider Notes (Signed)
CSN: [id number]     Arrival date & time 06/05/14  1032 History   First MD Initiated Contact with Patient 06/05/14 1102     Chief Complaint  Patient presents with  . Abscess    finger     (Consider location/radiation/quality/duration/timing/severity/associated sxs/prior Treatment) HPI   Martin [name] is a 58 y.o. male who presents to the Emergency Department requesting recheck of a finger abscess that was initially seen by me on 12/25 and had I&D performed on 12/27.  He has been taking clindamycin as directed and states that he was advised to f/u with hand specialist, he states that when he called their office, he was instructed to go back to the ED.  He states his symptoms seem to be improving.  He states the redness is improving and he continues to be able to move his finger without difficulty.  He denies fever, chills, excessive drainage, or numbness of the finger.     Past Medical History  Diagnosis Date  . Chest pain     January 11,2001,catheterization feeling 90% stenosis in the LAD with otherwise nonobstructive disease. An EF of 55% with mid to distal anterior hypokinesis. The LAD was successfully stented with a bare-metal stent  . Hypertension   . Hyperlipidemia   . Osteoarthritis   . Low back pain   . Seasonal allergies   . OSA on CPAP   . Polysubstance abuse     use including marijuana and vicodin,which he obtained from the street  . CAD (coronary artery disease)     there is a 82,001, status post stent to the RCA and mid LAD December 2011.  Followup Cardiolite study ejection fraction 54% with no reversible inferior defect 70-80% distal RCA not amenable to angioplasty.  Marland Kitchen PVC's (premature ventricular contractions)     status post Holter monitor normal sinus rhythm otherwise asymptomatic PVCs.  . Carotid artery disease     carotid Doppler course left side 5069% stenosis   Past Surgical History  Procedure Laterality Date  . Coronary angioplasty with stent placement   2011  . Tonsillectomy     Family History  Problem Relation Age of Onset  . Stroke Mother   . Heart attack Father   . Heart attack Brother   . Other Mother     small bowel obstruction   History  Substance Use Topics  . Smoking status: Former Smoker -- 1.00 packs/day for 35 years    Types: Cigarettes    Quit date: 06/09/1999  . Smokeless tobacco: Never Used  . Alcohol Use: Yes     Comment: drinks 6-8 ounces of liquor per day    Review of Systems  Constitutional: Negative for fever and chills.  Gastrointestinal: Negative for nausea and vomiting.  Musculoskeletal: Negative for joint swelling and arthralgias.  Skin:       Previous I&D of abscess to the right index finger.  Hematological: Negative for adenopathy.  All other systems reviewed and are negative.     Allergies  Penicillins  Home Medications   Prior to Admission medications   Medication Sig Start Date End Date Taking? Authorizing Provider  allopurinol (ZYLOPRIM) 300 MG tablet Take 300 mg by mouth daily.   Yes Historical Provider, MD  ALPRAZolam Duanne Moron) 0.5 MG tablet Take 0.5 mg by mouth at bedtime.     Yes Historical Provider, MD  aspirin 81 MG EC tablet Take 1 tablet (81 mg total) by mouth daily. 02/13/13  Yes Herminio Commons, MD  Cholecalciferol (VITAMIN D-3) 1000 UNITS CAPS Take 1 capsule by mouth daily.   Yes Historical Provider, MD  clindamycin (CLEOCIN) 300 MG capsule Take 1 capsule (300 mg total) by mouth 4 (four) times daily. For 10 days 06/01/14  Yes Gae Bihl L. Trica Usery, PA-C  diclofenac (VOLTAREN) 75 MG EC tablet Take 75 mg by mouth 2 (two) times daily.     Yes Historical Provider, MD  fluticasone (FLONASE) 50 MCG/ACT nasal spray Place 2 sprays into the nose daily.     Yes Historical Provider, MD  folic acid (FOLVITE) 1 MG tablet TAKE ONE TABLET BY MOUTH EVERY DAY 02/07/12  Yes Ezra Sites, MD  lisinopril (PRINIVIL,ZESTRIL) 40 MG tablet Take 1 tablet (40 mg total) by mouth daily. 04/13/14  Yes Herminio Commons, MD  loratadine (CLARITIN) 10 MG tablet Take 10 mg by mouth daily.     Yes Historical Provider, MD  magnesium oxide (MAG-OX) 400 MG tablet Take 400 mg by mouth daily.   Yes Historical Provider, MD  Multiple Vitamin (MULTIVITAMIN) tablet Take 1 tablet by mouth daily.     Yes Historical Provider, MD  Omega-3 Fatty Acids (FISH OIL) 1200 MG CAPS Take 1 capsule by mouth 2 (two) times daily.     Yes Historical Provider, MD  oxyCODONE-acetaminophen (PERCOCET/ROXICET) 5-325 MG per tablet Take 1 tablet by mouth every 4 (four) hours as needed for severe pain. 06/03/14  Yes Sharyon Cable, MD  polyethylene glycol [facility] / Floria Raveling) packet Take 17 g by mouth daily as needed for mild constipation.    Yes Historical Provider, MD  simvastatin (ZOCOR) 40 MG tablet Take 40 mg by mouth at bedtime.     Yes Historical Provider, MD  triamterene-hydrochlorothiazide (MAXZIDE-25) 37.5-25 MG per tablet Take 1 tablet by mouth daily.     Yes Historical Provider, MD  nitroGLYCERIN (NITROSTAT) 0.4 MG SL tablet Place 1 tablet (0.4 mg total) under the tongue every 5 (five) minutes as needed. 08/19/12   Minus Breeding, MD   BP 155/70 mmHg  Pulse 54  Temp(Src) 98.2 F (36.8 C) (Oral)  Resp 20  Ht 5\' 8"  (1.727 m)  Wt 260 lb (117.935 kg)  BMI 39.54 kg/m2  SpO2 97% Physical Exam  Constitutional: He is oriented to person, place, and time. He appears well-developed and well-nourished. No distress.  HENT:  Head: Normocephalic and atraumatic.  Cardiovascular: Normal rate, regular rhythm and normal heart sounds.   No murmur heard. Pulmonary/Chest: Effort normal and breath sounds normal. No respiratory distress.  Musculoskeletal:  Previous I&D of abscess to the proximal right index finger.  Erythema improved from previous visit.  Pt has full ROM of the finger, no lymphangitis. radial pulse and sensation intact.   Neurological: He is alert and oriented to person, place, and time. He exhibits normal muscle tone.  Coordination normal.  Skin: Skin is warm and dry.  Nursing note and vitals reviewed.   ED Course  Procedures (including critical care time) Labs Review Labs Reviewed - No data to display  Imaging Review No results found.   EKG Interpretation None      MDM   Final diagnoses:  Abscess of finger, right   Pt initially seen here by me.  He is taking clinda as directed.  Overall sx's seem to be improving nicely. Remains NV intact.  Pt agrees to complete abx, start warm water soaks and advised to return here if needed or to f/u with Dr. Aline Brochure who also saw pt on initial  ed visit and agreed to see him for f/u.   Appears stable for d/c and agrees to plan.  Jermarcus Mcfadyen L. Vanessa [place], PA-C 06/06/14 [place], DO 06/06/14 [phone]

## 2014-06-05 NOTE — ED Notes (Signed)
Pt was here on Friday to have abscess lanced on  2nd digit to right hand. Was rechecked in 2 days and told to see a speciallist, Call them this am and was told to come back here. Pt has finger covered and keeping clean

## 2014-06-05 NOTE — Discharge Instructions (Signed)

## 2014-06-25 ENCOUNTER — Telehealth: Payer: Self-pay | Admitting: *Deleted

## 2014-06-25 NOTE — Telephone Encounter (Signed)
Noted  

## 2014-06-25 NOTE — Telephone Encounter (Signed)
-----   Message from Herminio Commons, MD sent at 06/25/2014 10:21 AM EST ----- Regarding: RE: CARTOID DOPPLERS  -1 YR FU No, not necessary.  ----- Message -----    From: Laurine Blazer, LPN    Sent: 7/35/3299   2:18 PM      To: Herminio Commons, MD Subject: FW: CARTOID DOPPLERS  -1 YR FU                 See below -  Last carotid doppler 9/14 - actual report stated f/u 1 year.  Do you want done prior to next OV?   ----- Message -----    From: Chanda Busing    Sent: 06/21/2014   3:17 PM      To: Laurine Blazer, LPN Subject: CARTOID DOPPLERS  -1 YR FU                     Per recall Mr. Tarnowski had dopplers on 02-17-13.Marland Kitchen He is due to come back and see Dr. Raliegh Ip in April. Can you check and see if wants dopplers before next appt. Thanks.

## 2014-10-01 ENCOUNTER — Encounter: Payer: Self-pay | Admitting: Cardiovascular Disease

## 2014-10-01 ENCOUNTER — Ambulatory Visit (INDEPENDENT_AMBULATORY_CARE_PROVIDER_SITE_OTHER): Payer: 59 | Admitting: Cardiovascular Disease

## 2014-10-01 VITALS — BP 148/84 | HR 48 | Ht 68.0 in | Wt 263.0 lb

## 2014-10-01 DIAGNOSIS — Z7182 Exercise counseling: Secondary | ICD-10-CM

## 2014-10-01 DIAGNOSIS — E669 Obesity, unspecified: Secondary | ICD-10-CM

## 2014-10-01 DIAGNOSIS — I779 Disorder of arteries and arterioles, unspecified: Secondary | ICD-10-CM | POA: Diagnosis not present

## 2014-10-01 DIAGNOSIS — I10 Essential (primary) hypertension: Secondary | ICD-10-CM | POA: Diagnosis not present

## 2014-10-01 DIAGNOSIS — Z719 Counseling, unspecified: Secondary | ICD-10-CM

## 2014-10-01 DIAGNOSIS — E785 Hyperlipidemia, unspecified: Secondary | ICD-10-CM

## 2014-10-01 DIAGNOSIS — I25811 Atherosclerosis of native coronary artery of transplanted heart without angina pectoris: Secondary | ICD-10-CM | POA: Diagnosis not present

## 2014-10-01 DIAGNOSIS — I739 Peripheral vascular disease, unspecified: Secondary | ICD-10-CM

## 2014-10-01 DIAGNOSIS — G4730 Sleep apnea, unspecified: Secondary | ICD-10-CM

## 2014-10-01 NOTE — Progress Notes (Signed)
Patient ID: Martin [name], male   DOB: 01-24-1956, 59 y.o.   MRN: [id number]      SUBJECTIVE: Martin [name] is here to followup for coronary artery disease, hypertension, hyperlipidemia and bilateral carotid artery stenosis. He had drug-eluting stents placed to the RCA and mid LAD in November 2011.  He is doing well and denies chest pain, shortness of breath, lightheadedness, dizziness, palpitations, orthopnea, paroxysmal nocturnal dyspnea and leg swelling. He has left foot pain and is seeing a podiatrist tomorrow. He has not been exercising. SBP at home runs 120-150 mmHg.  ECG performed in the office today demonstrates sinus bradycardia, heart rate 49 bpm, with T-wave inversions in the anterolateral leads. This is not markedly different from ECG on 12/05/12 with perhaps slightly more prominent T wave inversions.   Review of Systems: As per "subjective", otherwise negative.  Allergies  Allergen Reactions  . Penicillins     REACTION: rash    Current Outpatient Prescriptions  Medication Sig Dispense Refill  . allopurinol (ZYLOPRIM) 300 MG tablet Take 300 mg by mouth daily.    Marland Kitchen ALPRAZolam (XANAX) 0.5 MG tablet Take 0.5 mg by mouth at bedtime.      Marland Kitchen aspirin 81 MG EC tablet Take 1 tablet (81 mg total) by mouth daily.    . Cholecalciferol (VITAMIN D-3) 1000 UNITS CAPS Take 1 capsule by mouth daily.    . clindamycin (CLEOCIN) 300 MG capsule Take 1 capsule (300 mg total) by mouth 4 (four) times daily. For 10 days (Patient taking differently: Take 300 mg by mouth 4 (four) times daily as needed. For 10 days) 40 capsule 0  . diclofenac (VOLTAREN) 75 MG EC tablet Take 75 mg by mouth 2 (two) times daily.      . fluticasone (FLONASE) 50 MCG/ACT nasal spray Place 2 sprays into the nose daily.      . folic acid (FOLVITE) 1 MG tablet TAKE ONE TABLET BY MOUTH EVERY DAY 30 tablet 6  . lisinopril (PRINIVIL,ZESTRIL) 40 MG tablet Take 1 tablet (40 mg total) by mouth daily. 30 tablet 6  . loratadine  (CLARITIN) 10 MG tablet Take 10 mg by mouth daily.      . magnesium oxide (MAG-OX) 400 MG tablet Take 400 mg by mouth daily.    . Multiple Vitamin (MULTIVITAMIN) tablet Take 1 tablet by mouth daily.      . nitroGLYCERIN (NITROSTAT) 0.4 MG SL tablet Place 1 tablet (0.4 mg total) under the tongue every 5 (five) minutes as needed. 25 tablet 0  . Omega-3 Fatty Acids (FISH OIL) 1200 MG CAPS Take 1 capsule by mouth 2 (two) times daily.      . polyethylene glycol (MIRALAX / GLYCOLAX) packet Take 17 g by mouth daily as needed for mild constipation.     . simvastatin (ZOCOR) 40 MG tablet Take 40 mg by mouth at bedtime.      . triamterene-hydrochlorothiazide (MAXZIDE-25) 37.5-25 MG per tablet Take 1 tablet by mouth daily.       No current facility-administered medications for this visit.    Past Medical History  Diagnosis Date  . Chest pain     January 11,2001,catheterization feeling 90% stenosis in the LAD with otherwise nonobstructive disease. An EF of 55% with mid to distal anterior hypokinesis. The LAD was successfully stented with a bare-metal stent  . Hypertension   . Hyperlipidemia   . Osteoarthritis   . Low back pain   . Seasonal allergies   . OSA on CPAP   .  Polysubstance abuse     use including marijuana and vicodin,which he obtained from the street  . CAD (coronary artery disease)     there is a 82,001, status post stent to the RCA and mid LAD December 2011.  Followup Cardiolite study ejection fraction 54% with no reversible inferior defect 70-80% distal RCA not amenable to angioplasty.  Marland Kitchen PVC's (premature ventricular contractions)     status post Holter monitor normal sinus rhythm otherwise asymptomatic PVCs.  . Carotid artery disease     carotid Doppler course left side 5069% stenosis    Past Surgical History  Procedure Laterality Date  . Coronary angioplasty with stent placement  2011  . Tonsillectomy      History   Social History  . Marital Status: Married    Spouse  Name: N/A  . Number of Children: N/A  . Years of Education: N/A   Occupational History  .      works Social worker employed   Social History Main Topics  . Smoking status: Former Smoker -- 1.00 packs/day for 35 years    Types: Cigarettes    Start date: 09/18/1967    Quit date: 06/09/1999  . Smokeless tobacco: Never Used  . Alcohol Use: 0.0 oz/week    0 Standard drinks or equivalent per week     Comment: drinks 6-8 ounces of liquor per day  . Drug Use: Yes    Special: Marijuana     Comment: smokes marijuana occassionally  . Sexual Activity: Not on file   Other Topics Concern  . Not on file   Social History Narrative   No exercise     Filed Vitals:   10/01/14 1506  BP: 148/84  Pulse: 48  Height: 5\' 8"  (1.727 m)  Weight: 263 lb (119.296 kg)    PHYSICAL EXAM General: NAD, obese HEENT: Normal. Neck: No JVD, no thyromegaly. Lungs: Clear to auscultation bilaterally with normal respiratory effort. CV: Nondisplaced PMI. Regular rate and rhythm, normal S1/S2, no S3/S4, no murmur. No pretibial or periankle edema. Stasis dermatitis of lower extremities. No carotid bruit. Normal pedal pulses.  Abdomen: Soft, nontender, obese, no hepatosplenomegaly, no distention.  Neurologic: Alert and oriented x 3.  Psych: Normal affect. Skin: Normal. Musculoskeletal: Normal range of motion, no gross deformities. Extremities: No clubbing or cyanosis.   ECG: Most recent ECG reviewed.      ASSESSMENT AND PLAN: 1. CAD: Stable ischemic heart disease. Continue ASA 81 mg daily and simvastatin 40 mg daily. I again stressed the importance of risk factor modification and regular aerobic exercise in particular.  2. Essential HTN: Elevated today with normal to higher readings at home. Encouraged exercise and weight loss. 3. Hyperlipidemia: Will obtain copy of lipids from PCP's office. Continue simvastatin 40 mg daily. 4. Carotid artery stenosis: Mild 1-39% bilaterally. Repeat in 1-2  years.  5. Sleep apnea: Stable, on CPAP.  Dispo: f/u 6 months.   Kate Sable, M.D., F.A.C.C.

## 2014-10-01 NOTE — Patient Instructions (Signed)
Continue all current medications. Your physician wants you to follow up in: 6 months.  You will receive a reminder letter in the mail one-two months in advance.  If you don't receive a letter, please call our office to schedule the follow up appointment   

## 2014-10-02 ENCOUNTER — Encounter: Payer: Self-pay | Admitting: *Deleted

## 2014-11-12 ENCOUNTER — Other Ambulatory Visit: Payer: Self-pay | Admitting: *Deleted

## 2014-11-12 MED ORDER — LISINOPRIL 40 MG PO TABS: 40.0000 mg | ORAL_TABLET | Freq: Every day | ORAL | Status: DC

## 2015-02-28 ENCOUNTER — Telehealth: Payer: Self-pay | Admitting: Cardiovascular Disease

## 2015-02-28 NOTE — Telephone Encounter (Signed)
Needs 1 yr Echo fu per Vascular report  Called and left message with patient Has appointment with Dr. Bronson Ing November 2016

## 2015-04-17 ENCOUNTER — Ambulatory Visit (INDEPENDENT_AMBULATORY_CARE_PROVIDER_SITE_OTHER): Payer: 59 | Admitting: Cardiovascular Disease

## 2015-04-17 ENCOUNTER — Encounter: Payer: Self-pay | Admitting: Cardiovascular Disease

## 2015-04-17 VITALS — BP 135/80 | HR 51 | Ht 67.0 in | Wt 264.0 lb

## 2015-04-17 DIAGNOSIS — I10 Essential (primary) hypertension: Secondary | ICD-10-CM

## 2015-04-17 DIAGNOSIS — E785 Hyperlipidemia, unspecified: Secondary | ICD-10-CM

## 2015-04-17 DIAGNOSIS — G4730 Sleep apnea, unspecified: Secondary | ICD-10-CM

## 2015-04-17 DIAGNOSIS — I25811 Atherosclerosis of native coronary artery of transplanted heart without angina pectoris: Secondary | ICD-10-CM

## 2015-04-17 DIAGNOSIS — I779 Disorder of arteries and arterioles, unspecified: Secondary | ICD-10-CM | POA: Diagnosis not present

## 2015-04-17 DIAGNOSIS — Z7189 Other specified counseling: Secondary | ICD-10-CM

## 2015-04-17 DIAGNOSIS — E669 Obesity, unspecified: Secondary | ICD-10-CM

## 2015-04-17 DIAGNOSIS — I739 Peripheral vascular disease, unspecified: Secondary | ICD-10-CM

## 2015-04-17 DIAGNOSIS — Z7182 Exercise counseling: Secondary | ICD-10-CM

## 2015-04-17 NOTE — Progress Notes (Signed)
Patient ID: Martin [name], male   DOB: 19-Apr-1956, 59 y.o.   MRN: [id number]      SUBJECTIVE: Martin [name] is here to followup for coronary artery disease, hypertension, hyperlipidemia and bilateral carotid artery stenosis. He had drug-eluting stents placed to the RCA and mid LAD in November 2011.  He is doing well and denies chest pain, shortness of breath, lightheadedness, dizziness, palpitations, orthopnea, paroxysmal nocturnal dyspnea and leg swelling.  His primary frustration has been due to bilateral feet neuropathy. He broke his right big toe last week. He broke his left foot last April. For this reason he has been unable to walk which he really likes to do. Because of this, he has put on an excessive amount of weight which he is frustrated about.   Review of Systems: As per "subjective", otherwise negative.  Allergies  Allergen Reactions  . Penicillins     REACTION: rash    Current Outpatient Prescriptions  Medication Sig Dispense Refill  . allopurinol (ZYLOPRIM) 300 MG tablet Take 300 mg by mouth daily.    Marland Kitchen ALPRAZolam (XANAX) 0.5 MG tablet Take 0.5 mg by mouth at bedtime.      Marland Kitchen aspirin 81 MG EC tablet Take 1 tablet (81 mg total) by mouth daily.    . Cholecalciferol (VITAMIN D-3) 1000 UNITS CAPS Take 1 capsule by mouth daily.    . diclofenac (VOLTAREN) 75 MG EC tablet Take 75 mg by mouth 2 (two) times daily.      . fluticasone (FLONASE) 50 MCG/ACT nasal spray Place 2 sprays into the nose daily.      . folic acid (FOLVITE) 1 MG tablet TAKE ONE TABLET BY MOUTH EVERY DAY 30 tablet 6  . lisinopril (PRINIVIL,ZESTRIL) 40 MG tablet Take 1 tablet (40 mg total) by mouth daily. 30 tablet 6  . loratadine (CLARITIN) 10 MG tablet Take 10 mg by mouth daily.      . magnesium oxide (MAG-OX) 400 MG tablet Take 400 mg by mouth daily.    . Multiple Vitamin (MULTIVITAMIN) tablet Take 1 tablet by mouth daily.      . Multiple Vitamins-Minerals (MULTI-VITAMIN GUMMIES) CHEW Chew 3 each by  mouth daily.    . nitroGLYCERIN (NITROSTAT) 0.4 MG SL tablet Place 1 tablet (0.4 mg total) under the tongue every 5 (five) minutes as needed. 25 tablet 0  . Omega-3 Fatty Acids (FISH OIL) 1200 MG CAPS Take 1 capsule by mouth 2 (two) times daily.      . polyethylene glycol (MIRALAX / GLYCOLAX) packet Take 17 g by mouth daily as needed for mild constipation.     . simvastatin (ZOCOR) 40 MG tablet Take 40 mg by mouth at bedtime.      . triamterene-hydrochlorothiazide (MAXZIDE-25) 37.5-25 MG per tablet Take 1 tablet by mouth daily.       No current facility-administered medications for this visit.    Past Medical History  Diagnosis Date  . Chest pain     January 11,2001,catheterization feeling 90% stenosis in the LAD with otherwise nonobstructive disease. An EF of 55% with mid to distal anterior hypokinesis. The LAD was successfully stented with a bare-metal stent  . Hypertension   . Hyperlipidemia   . Osteoarthritis   . Low back pain   . Seasonal allergies   . OSA on CPAP   . Polysubstance abuse     use including marijuana and vicodin,which he obtained from the street  . CAD (coronary artery disease)     there is  a 82,001, status post stent to the RCA and mid LAD December 2011.  Followup Cardiolite study ejection fraction 54% with no reversible inferior defect 70-80% distal RCA not amenable to angioplasty.  Marland Kitchen PVC's (premature ventricular contractions)     status post Holter monitor normal sinus rhythm otherwise asymptomatic PVCs.  . Carotid artery disease ([place])     carotid Doppler course left side 5069% stenosis    Past Surgical History  Procedure Laterality Date  . Coronary angioplasty with stent placement  2011  . Tonsillectomy      Social History   Social History  . Marital Status: Married    Spouse Name: N/A  . Number of Children: N/A  . Years of Education: N/A   Occupational History  .      works Social worker employed   Social History Main Topics  . Smoking  status: Former Smoker -- 1.00 packs/day for 35 years    Types: Cigarettes    Start date: 09/18/1967    Quit date: 06/09/1999  . Smokeless tobacco: Never Used  . Alcohol Use: 0.0 oz/week    0 Standard drinks or equivalent per week     Comment: drinks 6-8 ounces of liquor per day  . Drug Use: Yes    Special: Marijuana     Comment: smokes marijuana occassionally  . Sexual Activity: Not on file   Other Topics Concern  . Not on file   Social History Narrative   No exercise     Filed Vitals:   04/17/15 0813  BP: 135/80  Pulse: 51  Height: 5\' 7"  (1.702 m)  Weight: 264 lb (119.75 kg)    PHYSICAL EXAM General: NAD HEENT: Normal. Neck: No JVD, no thyromegaly. Lungs: Clear to auscultation bilaterally with normal respiratory effort. CV: Nondisplaced PMI.  Regular rate and rhythm, normal S1/S2, no S3/S4, no murmur. No pretibial or periankle edema.  No carotid bruit.   Abdomen: Soft, nontender, obese. Neurologic: Alert and oriented x 3.  Psych: Normal affect. Skin: Normal. Musculoskeletal: Right foot in boot. Extremities: No clubbing or cyanosis.   ECG: Most recent ECG reviewed.      ASSESSMENT AND PLAN: 1. CAD: Stable ischemic heart disease. Continue ASA 81 mg daily and simvastatin 40 mg daily. I encouraged him to do repetitive light weights with his arms, and consider joining the [facility] during the fall/winter months to use the pool for aerobic activity which will help with weight loss.  2. Essential HTN: Controlled. No changes.  3. Hyperlipidemia: Lipid panel on 09/05/14 showed total cholesterol 179, HDL 62, LDL 85, triglycerides 160. Continue simvastatin 40 mg daily.  4. Carotid artery stenosis: Mild 1-39% bilaterally. Repeat in 1.5-2 years.   5. Sleep apnea: Stable, on CPAP.  Dispo: f/u 1 year.  Kate Sable, M.D., F.A.C.C.

## 2015-04-17 NOTE — Patient Instructions (Signed)
Continue all current medications. Your physician wants you to follow up in:  1 year.  You will receive a reminder letter in the mail one-two months in advance.  If you don't receive a letter, please call our office to schedule the follow up appointment   

## 2015-04-20 ENCOUNTER — Emergency Department (HOSPITAL_COMMUNITY)
Admission: EM | Admit: 2015-04-20 | Discharge: 2015-04-20 | Disposition: A | Discharge status: Home / Self Care | Payer: 59 | Attending: Emergency Medicine | Admitting: Emergency Medicine

## 2015-04-20 ENCOUNTER — Emergency Department (HOSPITAL_COMMUNITY): Payer: 59

## 2015-04-20 ENCOUNTER — Encounter (HOSPITAL_COMMUNITY): Payer: Self-pay | Admitting: Emergency Medicine

## 2015-04-20 DIAGNOSIS — Y9289 Other specified places as the place of occurrence of the external cause: Secondary | ICD-10-CM | POA: Insufficient documentation

## 2015-04-20 DIAGNOSIS — E785 Hyperlipidemia, unspecified: Secondary | ICD-10-CM | POA: Insufficient documentation

## 2015-04-20 DIAGNOSIS — Z7951 Long term (current) use of inhaled steroids: Secondary | ICD-10-CM | POA: Insufficient documentation

## 2015-04-20 DIAGNOSIS — G4733 Obstructive sleep apnea (adult) (pediatric): Secondary | ICD-10-CM | POA: Insufficient documentation

## 2015-04-20 DIAGNOSIS — Z9981 Dependence on supplemental oxygen: Secondary | ICD-10-CM | POA: Diagnosis not present

## 2015-04-20 DIAGNOSIS — I2510 Atherosclerotic heart disease of native coronary artery without angina pectoris: Secondary | ICD-10-CM | POA: Diagnosis not present

## 2015-04-20 DIAGNOSIS — Y998 Other external cause status: Secondary | ICD-10-CM | POA: Diagnosis not present

## 2015-04-20 DIAGNOSIS — Z7982 Long term (current) use of aspirin: Secondary | ICD-10-CM | POA: Insufficient documentation

## 2015-04-20 DIAGNOSIS — M1990 Unspecified osteoarthritis, unspecified site: Secondary | ICD-10-CM | POA: Diagnosis not present

## 2015-04-20 DIAGNOSIS — W228XXA Striking against or struck by other objects, initial encounter: Secondary | ICD-10-CM | POA: Diagnosis not present

## 2015-04-20 DIAGNOSIS — Z79899 Other long term (current) drug therapy: Secondary | ICD-10-CM | POA: Diagnosis not present

## 2015-04-20 DIAGNOSIS — S92911A Unspecified fracture of right toe(s), initial encounter for closed fracture: Secondary | ICD-10-CM

## 2015-04-20 DIAGNOSIS — Y9389 Activity, other specified: Secondary | ICD-10-CM | POA: Diagnosis not present

## 2015-04-20 DIAGNOSIS — Z87891 Personal history of nicotine dependence: Secondary | ICD-10-CM | POA: Insufficient documentation

## 2015-04-20 DIAGNOSIS — S92501A Displaced unspecified fracture of right lesser toe(s), initial encounter for closed fracture: Secondary | ICD-10-CM | POA: Diagnosis not present

## 2015-04-20 DIAGNOSIS — Z791 Long term (current) use of non-steroidal anti-inflammatories (NSAID): Secondary | ICD-10-CM | POA: Insufficient documentation

## 2015-04-20 DIAGNOSIS — I10 Essential (primary) hypertension: Secondary | ICD-10-CM | POA: Insufficient documentation

## 2015-04-20 DIAGNOSIS — Z9861 Coronary angioplasty status: Secondary | ICD-10-CM | POA: Diagnosis not present

## 2015-04-20 DIAGNOSIS — M25571 Pain in right ankle and joints of right foot: Secondary | ICD-10-CM

## 2015-04-20 DIAGNOSIS — Z880 Allergy status to penicillin: Secondary | ICD-10-CM | POA: Insufficient documentation

## 2015-04-20 DIAGNOSIS — S99911A Unspecified injury of right ankle, initial encounter: Secondary | ICD-10-CM | POA: Diagnosis present

## 2015-04-20 MED ORDER — OXYCODONE-ACETAMINOPHEN 5-325 MG PO TABS: 1.0000 | ORAL_TABLET | ORAL | Status: DC | PRN

## 2015-04-20 MED ORDER — IBUPROFEN 800 MG PO TABS
800.0000 mg | ORAL_TABLET | Freq: Once | ORAL | Status: AC
  Administered 2015-04-20: 800 mg via ORAL
  Filled 2015-04-20: qty 1

## 2015-04-20 NOTE — Discharge Instructions (Signed)
Ankle Pain Ankle pain is a common symptom. The bones, cartilage, tendons, and muscles of the ankle joint perform a lot of work each day. The ankle joint holds your body weight and allows you to move around. Ankle pain can occur on either side or back of 1 or both ankles. Ankle pain may be sharp and burning or dull and aching. There may be tenderness, stiffness, redness, or warmth around the ankle. The pain occurs more often when a person walks or puts pressure on the ankle. CAUSES  There are many reasons ankle pain can develop. It is important to work with your caregiver to identify the cause since many conditions can impact the bones, cartilage, muscles, and tendons. Causes for ankle pain include:  Injury, including a break (fracture), sprain, or strain often due to a fall, sports, or a high-impact activity.  Swelling (inflammation) of a tendon (tendonitis).  Achilles tendon rupture.  Ankle instability after repeated sprains and strains.  Poor foot alignment.  Pressure on a nerve (tarsal tunnel syndrome).  Arthritis in the ankle or the lining of the ankle.  Crystal formation in the ankle (gout or pseudogout). DIAGNOSIS  A diagnosis is based on your medical history, your symptoms, results of your physical exam, and results of diagnostic tests. Diagnostic tests may include X-ray exams or a computerized magnetic scan (magnetic resonance imaging, MRI). TREATMENT  Treatment will depend on the cause of your ankle pain and may include:  Keeping pressure off the ankle and limiting activities.  Using crutches or other walking support (a cane or brace).  Using rest, ice, compression, and elevation.  Participating in physical therapy or home exercises.  Wearing shoe inserts or special shoes.  Losing weight.  Taking medications to reduce pain or swelling or receiving an injection.  Undergoing surgery. HOME CARE INSTRUCTIONS   Only take over-the-counter or prescription medicines for  pain, discomfort, or fever as directed by your caregiver.  Put ice on the injured area.  Put ice in a plastic bag.  Place a towel between your skin and the bag.  Leave the ice on for 15-20 minutes at a time, 03-04 times a day.  Keep your leg raised (elevated) when possible to lessen swelling.  Avoid activities that cause ankle pain.  Follow specific exercises as directed by your caregiver.  Record how often you have ankle pain, the location of the pain, and what it feels like. This information may be helpful to you and your caregiver.  Ask your caregiver about returning to work or sports and whether you should drive.  Follow up with your caregiver for further examination, therapy, or testing as directed. SEEK MEDICAL CARE IF:   Pain or swelling continues or worsens beyond 1 week.  You have an oral temperature above 102 F (38.9 C).  You are feeling unwell or have chills.  You are having an increasingly difficult time with walking.  You have loss of sensation or other new symptoms.  You have questions or concerns. MAKE SURE YOU:   Understand these instructions.  Will watch your condition.  Will get help right away if you are not doing well or get worse.   This information is not intended to replace advice given to you by your health care provider. Make sure you discuss any questions you have with your health care provider.   Document Released: 11/12/2009 Document Revised: 08/17/2011 Document Reviewed: 12/25/2014 Elsevier Interactive Patient Education 2016 [employer]  Toe Fracture A toe fracture is a break  in one of the toe bones (phalanges). CAUSES This condition may be caused by:  Dropping a heavy object on your toe.  Stubbing your toe.  Overusing your toe or doing repetitive exercise.  Twisting or stretching your toe out of place. RISK FACTORS This condition is more likely to develop in people who:  Play contact sports.  Have a bone  disease.  Have a low calcium level. SYMPTOMS The main symptoms of this condition are swelling and pain in the toe. The pain may get worse with standing or walking. Other symptoms include:  Bruising.  Stiffness.  Numbness.  A change in the way the toe looks.  Broken bones that poke through the skin.  Blood beneath the toenail. DIAGNOSIS This condition is diagnosed with a physical exam. You may also have X-rays. TREATMENT  Treatment for this condition depends on the type of fracture and its severity. Treatment may involve:  Taping the broken toe to a toe that is next to it (buddy taping). This is the most common treatment for fractures in which the bone has not moved out of place (nondisplaced fracture).  Wearing a shoe that has a wide, rigid sole to protect the toe and to limit its movement.  Wearing a walking cast.  Having a procedure to move the toe back into place.  Surgery. This may be needed:  If there are many pieces of broken bone that are out of place (displaced).  If the toe joint breaks.  If the bone breaks through the skin.  Physical therapy. This is done to help regain movement and strength in the toe. You may need follow-up X-rays to make sure that the bone is healing well and staying in position. HOME CARE INSTRUCTIONS If You Have a Cast:  Do not stick anything inside the cast to scratch your skin. Doing that increases your risk of infection.  Check the skin around the cast every day. Report any concerns to your health care provider. You may put lotion on dry skin around the edges of the cast. Do not apply lotion to the skin underneath the cast.  Do not put pressure on any part of the cast until it is fully hardened. This may take several hours.  Keep the cast clean and dry. Bathing  Do not take baths, swim, or use a hot tub until your health care provider approves. Ask your health care provider if you can take showers. You may only be allowed to take  sponge baths for bathing.  If your health care provider approves bathing and showering, cover the cast or bandage (dressing) with a watertight plastic bag to protect it from water. Do not let the cast or dressing get wet. Managing Pain, Stiffness, and Swelling  If you do not have a cast, apply ice to the injured area, if directed.  Put ice in a plastic bag.  Place a towel between your skin and the bag.  Leave the ice on for 20 minutes, 2-3 times per day.  Move your toes often to avoid stiffness and to lessen swelling.  Raise (elevate) the injured area above the level of your heart while you are sitting or lying down. Driving  Do not drive or operate heavy machinery while taking pain medicine.  Do not drive while wearing a cast on a foot that you use for driving. Activity  Return to your normal activities as directed by your health care provider. Ask your health care provider what activities are safe for you.  Perform exercises daily as directed by your health care provider or physical therapist. Safety  Do not use the injured limb to support your body weight until your health care provider says that you can. Use crutches or other assistive devices as directed by your health care provider. General Instructions  If your toe was treated with buddy taping, follow your health care provider's instructions for changing the gauze and tape. Change it more often:  The gauze and tape get wet. If this happens, dry the space between the toes.  The gauze and tape are too tight and cause your toe to become pale or numb.  Wear a protective shoe as directed by your health care provider. If you were not given a protective shoe, wear sturdy, supportive shoes. Your shoes should not pinch your toes and should not fit tightly against your toes.  Do not use any tobacco products, including cigarettes, chewing tobacco, or e-cigarettes. Tobacco can delay bone healing. If you need help quitting, ask your  health care provider.  Take medicines only as directed by your health care provider.  Keep all follow-up visits as directed by your health care provider. This is important. SEEK MEDICAL CARE IF:  You have a fever.  Your pain medicine is not helping.  Your toe is cold.  Your toe is numb.  You still have pain after one week of rest and treatment.  You still have pain after your health care provider has said that you can start walking again.  You have pain, tingling, or numbness in your foot that is not going away. SEEK IMMEDIATE MEDICAL CARE IF:  You have severe pain.  You have redness or inflammation in your toe that is getting worse.  You have pain or numbness in your toe that is getting worse.  Your toe turns blue.   This information is not intended to replace advice given to you by your health care provider. Make sure you discuss any questions you have with your health care provider.   Document Released: 05/22/2000 Document Revised: 02/13/2015 Document Reviewed: 03/21/2014 Elsevier Interactive Patient Education [employer].

## 2015-04-20 NOTE — ED Provider Notes (Signed)
CSN: [id number]     Arrival date & time 04/20/15  [id number] History  By signing my name below, I, Martin [name], attest that this documentation has been prepared under the direction and in the presence of Margarita Mail, PA-C. Electronically Signed: Julien [name], ED Scribe. 04/20/2015. 9:05 AM.    Chief Complaint  Patient presents with  . Ankle Pain    right      The history is provided by the patient. No language interpreter was used.   HPI Comments: Martin [name] is a 59 y.o. male who has a hx of HTN, CAD, and foot neuropathy presents to the Emergency Department complaining of constant, gradual worsening right ankle pain onset . He has associated right ankle swelling as well. He notes pain in his right ankle when ambulating. Pt did not take any medication to alleviate the pain. Pt reports breaking his middle toe on his right foot by a ladder about one week ago. He saw a Podiatrist on Tuesday and was placed in an ankle boot. He denies hx of diabetes and calf pain.  Past Medical History  Diagnosis Date  . Chest pain     January 11,2001,catheterization feeling 90% stenosis in the LAD with otherwise nonobstructive disease. An EF of 55% with mid to distal anterior hypokinesis. The LAD was successfully stented with a bare-metal stent  . Hypertension   . Hyperlipidemia   . Osteoarthritis   . Low back pain   . Seasonal allergies   . OSA on CPAP   . Polysubstance abuse     use including marijuana and vicodin,which he obtained from the street  . CAD (coronary artery disease)     there is a 82,001, status post stent to the RCA and mid LAD December 2011.  Followup Cardiolite study ejection fraction 54% with no reversible inferior defect 70-80% distal RCA not amenable to angioplasty.  Marland Kitchen PVC's (premature ventricular contractions)     status post Holter monitor normal sinus rhythm otherwise asymptomatic PVCs.  . Carotid artery disease ([place])     carotid Doppler course left side 5069% stenosis    Past Surgical History  Procedure Laterality Date  . Coronary angioplasty with stent placement  2011  . Tonsillectomy     Family History  Problem Relation Age of Onset  . Stroke Mother   . Heart attack Father   . Heart attack Brother   . Other Mother     small bowel obstruction   Social History  Substance Use Topics  . Smoking status: Former Smoker -- 1.00 packs/day for 35 years    Types: Cigarettes    Start date: 09/18/1967    Quit date: 06/09/1999  . Smokeless tobacco: Never Used  . Alcohol Use: 0.0 oz/week    0 Standard drinks or equivalent per week     Comment: drinks 6-8 ounces of liquor per day    Review of Systems  A complete 10 system review of systems was obtained and all systems are negative except as noted in the HPI and PMH.   Allergies  Penicillins  Home Medications   Prior to Admission medications   Medication Sig Start Date End Date Taking? Authorizing Provider  allopurinol (ZYLOPRIM) 300 MG tablet Take 300 mg by mouth daily.    Historical Provider, MD  ALPRAZolam Duanne Moron) 0.5 MG tablet Take 0.5 mg by mouth at bedtime.      Historical Provider, MD  aspirin 81 MG EC tablet Take 1 tablet (81 mg total) by mouth  daily. 02/13/13   Herminio Commons, MD  Cholecalciferol (VITAMIN D-3) 1000 UNITS CAPS Take 1 capsule by mouth daily.    Historical Provider, MD  diclofenac (VOLTAREN) 75 MG EC tablet Take 75 mg by mouth 2 (two) times daily.      Historical Provider, MD  fluticasone (FLONASE) 50 MCG/ACT nasal spray Place 2 sprays into the nose daily.      Historical Provider, MD  folic acid (FOLVITE) 1 MG tablet TAKE ONE TABLET BY MOUTH EVERY DAY 02/07/12   Ezra Sites, MD  lisinopril (PRINIVIL,ZESTRIL) 40 MG tablet Take 1 tablet (40 mg total) by mouth daily. 11/12/14   Herminio Commons, MD  loratadine (CLARITIN) 10 MG tablet Take 10 mg by mouth daily.      Historical Provider, MD  magnesium oxide (MAG-OX) 400 MG tablet Take 400 mg by mouth daily.    Historical  Provider, MD  Multiple Vitamin (MULTIVITAMIN) tablet Take 1 tablet by mouth daily.      Historical Provider, MD  Multiple Vitamins-Minerals (MULTI-VITAMIN GUMMIES) CHEW Chew 3 each by mouth daily.    Historical Provider, MD  nitroGLYCERIN (NITROSTAT) 0.4 MG SL tablet Place 1 tablet (0.4 mg total) under the tongue every 5 (five) minutes as needed. 08/19/12   Minus Breeding, MD  Omega-3 Fatty Acids (FISH OIL) 1200 MG CAPS Take 1 capsule by mouth 2 (two) times daily.      Historical Provider, MD  polyethylene glycol (MIRALAX / GLYCOLAX) packet Take 17 g by mouth daily as needed for mild constipation.     Historical Provider, MD  simvastatin (ZOCOR) 40 MG tablet Take 40 mg by mouth at bedtime.      Historical Provider, MD  triamterene-hydrochlorothiazide (MAXZIDE-25) 37.5-25 MG per tablet Take 1 tablet by mouth daily.      Historical Provider, MD   Triage vitals: BP 172/91 mmHg  Pulse 65  Temp(Src) 97.9 F (36.6 C) (Oral)  Resp 16  Ht 5\' 7"  (1.702 m)  Wt 260 lb (117.935 kg)  BMI 40.71 kg/m2  SpO2 99% Physical Exam  Constitutional: He appears well-developed and well-nourished. No distress.  HENT:  Head: Normocephalic and atraumatic.  Eyes: Conjunctivae are normal. No scleral icterus.  Neck: Normal range of motion. Neck supple.  Cardiovascular: Normal rate, regular rhythm and normal heart sounds.   Pulmonary/Chest: Effort normal and breath sounds normal. No respiratory distress.  Abdominal: Soft. There is no tenderness.  Musculoskeletal: He exhibits no edema.  R middle toe with swelling, bruising and tenderness. R ankle with trace pitting edema. TTP range of motion decreased due to pain. Thready but palpable DP/PT pulses BL  Neurological: He is alert.  Skin: Skin is warm and dry. He is not diaphoretic.  Psychiatric: His behavior is normal.  Nursing note and vitals reviewed.   ED Course  Procedures  DIAGNOSTIC STUDIES: Oxygen Saturation is 99% on RA, normal by my  interpretation.  COORDINATION OF CARE:  9:39 AM Discussed treatment plan with pt at bedside and pt agreed to plan.  Labs Review Labs Reviewed - No data to display  Imaging Review No results found. I have personally reviewed and evaluated these images and lab results as part of my medical decision-making.   EKG Interpretation None      MDM   Final diagnoses:  Ankle pain, right  Toe fracture, right, closed, initial encounter    Patient with swelling and pain in the ankle. Likely secondary to fracture of the toe, OA and gait abnormality.  Doubt gout or infection   I personally performed the services described in this documentation, which was scribed in my presence. The recorded information has been reviewed and is accurate.       Margarita Mail, PA-C 04/20/15 1001  Daleen Bo, MD 04/21/15 1534

## 2015-04-20 NOTE — ED Notes (Signed)
Broke toes to right foot and was seen by Podiatrist on Tuesday, here c/o right ankle pain and swelling.  Rates pain 8/10.

## 2015-06-01 ENCOUNTER — Other Ambulatory Visit: Payer: Self-pay | Admitting: Cardiovascular Disease

## 2016-04-20 ENCOUNTER — Ambulatory Visit (INDEPENDENT_AMBULATORY_CARE_PROVIDER_SITE_OTHER): Payer: BLUE CROSS/BLUE SHIELD | Admitting: Internal Medicine

## 2016-04-20 ENCOUNTER — Encounter: Payer: Self-pay | Admitting: Internal Medicine

## 2016-04-20 ENCOUNTER — Ambulatory Visit: Payer: Self-pay | Admitting: Cardiovascular Disease

## 2016-04-20 VITALS — BP 132/70 | HR 65 | Ht 67.0 in | Wt 267.0 lb

## 2016-04-20 DIAGNOSIS — G4730 Sleep apnea, unspecified: Secondary | ICD-10-CM

## 2016-04-20 DIAGNOSIS — I10 Essential (primary) hypertension: Secondary | ICD-10-CM | POA: Diagnosis not present

## 2016-04-20 DIAGNOSIS — I779 Disorder of arteries and arterioles, unspecified: Secondary | ICD-10-CM

## 2016-04-20 DIAGNOSIS — I739 Peripheral vascular disease, unspecified: Secondary | ICD-10-CM

## 2016-04-20 NOTE — Progress Notes (Signed)
Cardiology Office Note   Date:  04/20/2016   ID:  [name], [name] Dec 13, 1955, MRN [id number]  PCP:  Deloria Lair, MD  Cardiologist:   Dorris Carnes, MD    Pt presents for f/u of CAD    History of Present Illness: Martin [name] is a 60 y.o. male with a history of CAD, HTN, HL and CV dz.  He is s/p DES to RCA and LAD in Ov [address]  Followed by [facility]  Last seen in Nov 2016  No CP  No dizziness  No SOB   Keep up pace  Works in gutter work       Outpatient Medications Prior to Visit  Medication Sig Dispense Refill  . allopurinol (ZYLOPRIM) 300 MG tablet Take 300 mg by mouth daily.    Marland Kitchen ALPRAZolam (XANAX) 0.5 MG tablet Take 0.5 mg by mouth at bedtime.      Marland Kitchen aspirin 81 MG EC tablet Take 1 tablet (81 mg total) by mouth daily.    . Cholecalciferol (VITAMIN D-3) 1000 UNITS CAPS Take 1 capsule by mouth daily.    . diclofenac (VOLTAREN) 75 MG EC tablet Take 75 mg by mouth 2 (two) times daily.      . fluticasone (FLONASE) 50 MCG/ACT nasal spray Place 2 sprays into the nose daily.      . folic acid (FOLVITE) 1 MG tablet TAKE ONE TABLET BY MOUTH EVERY DAY 30 tablet 6  . lisinopril (PRINIVIL,ZESTRIL) 40 MG tablet TAKE ONE TABLET BY MOUTH ONCE DAILY 30 tablet 11  . loratadine (CLARITIN) 10 MG tablet Take 10 mg by mouth daily.      . magnesium oxide (MAG-OX) 400 MG tablet Take 400 mg by mouth daily.    . Multiple Vitamin (MULTIVITAMIN) tablet Take 1 tablet by mouth daily.      . nitroGLYCERIN (NITROSTAT) 0.4 MG SL tablet Place 1 tablet (0.4 mg total) under the tongue every 5 (five) minutes as needed. 25 tablet 0  . Omega-3 Fatty Acids (FISH OIL) 1200 MG CAPS Take 1 capsule by mouth 2 (two) times daily.      Marland Kitchen oxyCODONE-acetaminophen (PERCOCET) 5-325 MG tablet Take 1-2 tablets by mouth every 4 (four) hours as needed. 20 tablet 0  . polyethylene glycol (MIRALAX / GLYCOLAX) packet Take 17 g by mouth daily as needed for mild constipation.     . simvastatin (ZOCOR) 40 MG tablet Take  40 mg by mouth at bedtime.      . triamterene-hydrochlorothiazide (MAXZIDE-25) 37.5-25 MG per tablet Take 1 tablet by mouth daily.       No facility-administered medications prior to visit.      Allergies:   Penicillins   Past Medical History:  Diagnosis Date  . CAD (coronary artery disease)    there is a 82,001, status post stent to the RCA and mid LAD December 2011.  Followup Cardiolite study ejection fraction 54% with no reversible inferior defect 70-80% distal RCA not amenable to angioplasty.  . Carotid artery disease ([place])    carotid Doppler course left side 5069% stenosis  . Chest pain    January 11,2001,catheterization feeling 90% stenosis in the LAD with otherwise nonobstructive disease. An EF of 55% with mid to distal anterior hypokinesis. The LAD was successfully stented with a bare-metal stent  . Hyperlipidemia   . Hypertension   . Low back pain   . OSA on CPAP   . Osteoarthritis   . Polysubstance abuse    use including  marijuana and vicodin,which he obtained from the street  . PVC's (premature ventricular contractions)    status post Holter monitor normal sinus rhythm otherwise asymptomatic PVCs.  . Seasonal allergies     Past Surgical History:  Procedure Laterality Date  . CORONARY ANGIOPLASTY WITH STENT PLACEMENT  2011  . TONSILLECTOMY       Social History:  The patient  reports that he quit smoking about 16 years ago. His smoking use included Cigarettes. He started smoking about 48 years ago. He has a 35.00 pack-year smoking history. He has never used smokeless tobacco. He reports that he drinks alcohol. He reports that he uses drugs, including Marijuana.   Family History:  The patient's family history includes Heart attack in his brother and father; Other in his mother; Stroke in his mother.    ROS:  Please see the history of present illness. All other systems are reviewed and  Negative to the above problem except as noted.    PHYSICAL EXAM: VS:  BP 132/70    Pulse 65   Ht 5\' 7"  (1.702 m)   Wt 267 lb (121.1 kg)   SpO2 95%   BMI 41.82 kg/m   GEN: Morbidly obese 60 yo in no acute distress  HEENT: normal  Neck: no JVD  ? Faint R carotid bruit  No  masses Cardiac: RRR; no murmurs, rubs, or gallops,no edema  Respiratory:  clear to auscultation bilaterally, normal work of breathing GI: soft, nontender, nondistended, + BS  No hepatomegaly  MS: no deformity Moving all extremities   Skin: warm and dry, no rash Neuro:  Strength and sensation are intact Psych: euthymic mood, full affect   EKG:  EKG is ordered today.  SB 54 bpm  Occasoinal PVC  Anteroseptal MI  Sl ST Depression in inferior and lateral leads     Lipid Panel    Component Value Date/Time   CHOL  05/21/2010 0530    168        ATP III CLASSIFICATION:  <200     mg/dL   Desirable  200-239  mg/dL   Borderline High  >=240    mg/dL   High          TRIG 291 (H) 05/21/2010 0530   HDL 38 (L) 05/21/2010 0530   CHOLHDL 4.4 05/21/2010 0530   VLDL 58 (H) 05/21/2010 0530   LDLCALC  05/21/2010 0530    72        Total Cholesterol/HDL:CHD Risk Coronary Heart Disease Risk Table                     Men   Women  1/2 Average Risk   3.4   3.3  Average Risk       5.0   4.4  2 X Average Risk   9.6   7.1  3 X Average Risk  23.4   11.0        Use the calculated Patient Ratio above and the CHD Risk Table to determine the patient's CHD Risk.        ATP III CLASSIFICATION (LDL):  <100     mg/dL   Optimal  100-129  mg/dL   Near or Above                    Optimal  130-159  mg/dL   Borderline  160-189  mg/dL   High  >190     mg/dL   Very High  Wt Readings from Last 3 Encounters:  04/20/16 267 lb (121.1 kg)  04/20/15 260 lb (117.9 kg)  04/17/15 264 lb (119.7 kg)      ASSESSMENT AND PLAN:  1.  CAD  nO symptoms to sugg angina  Active   Keep on same meds    2.  HTN  BP is good    3  HL  Will get labs from Dr Scotty Court    4  CV dz  Will set up for carotid dopplers   4  Sleep  apnea  Pt last tested 16 years aog  Will set up for repeat study to confirm settings                         Current medicines are reviewed at length with the patient today.  The patient does not have concerns regarding medicines.  Signed, Dorris Carnes, MD  04/20/2016 1:45 PM    [facility] [address], [place], [place]  [address] Phone: [phone]; Fax: [phone]

## 2016-04-20 NOTE — Patient Instructions (Addendum)
Your physician wants you to follow-up in: 1 year Dr.Ross You will receive a reminder letter in the mail two months in advance. If you don't receive a letter, please call our office to schedule the follow-up appointment.    Your physician recommends that you continue on your current medications as directed. Please refer to the Current Medication list given to you today.   Your physician has recommended that you have a sleep study. This test records several body functions during sleep, including: brain activity, eye movement, oxygen and carbon dioxide blood levels, heart rate and rhythm, breathing rate and rhythm, the flow of air through your mouth and nose, snoring, body muscle movements, and chest and belly movement.    Your physician has requested that you have a carotid duplex. This test is an ultrasound of the carotid arteries in your neck. It looks at blood flow through these arteries that supply the brain with blood. Allow one hour for this exam. There are no restrictions or special instructions.       Thank you for choosing [facility] !

## 2016-04-23 ENCOUNTER — Other Ambulatory Visit: Payer: Self-pay | Admitting: *Deleted

## 2016-04-23 DIAGNOSIS — G4730 Sleep apnea, unspecified: Secondary | ICD-10-CM

## 2016-05-03 ENCOUNTER — Ambulatory Visit: Payer: BLUE CROSS/BLUE SHIELD | Attending: Internal Medicine | Admitting: Neurology

## 2016-05-03 DIAGNOSIS — G4730 Sleep apnea, unspecified: Secondary | ICD-10-CM

## 2016-05-03 DIAGNOSIS — R0683 Snoring: Secondary | ICD-10-CM | POA: Diagnosis present

## 2016-05-03 DIAGNOSIS — Z79899 Other long term (current) drug therapy: Secondary | ICD-10-CM | POA: Diagnosis not present

## 2016-05-03 DIAGNOSIS — Z7982 Long term (current) use of aspirin: Secondary | ICD-10-CM | POA: Insufficient documentation

## 2016-05-03 DIAGNOSIS — G4733 Obstructive sleep apnea (adult) (pediatric): Secondary | ICD-10-CM | POA: Diagnosis not present

## 2016-05-13 ENCOUNTER — Ambulatory Visit: Payer: BLUE CROSS/BLUE SHIELD

## 2016-05-13 DIAGNOSIS — I779 Disorder of arteries and arterioles, unspecified: Secondary | ICD-10-CM

## 2016-05-13 DIAGNOSIS — I6523 Occlusion and stenosis of bilateral carotid arteries: Secondary | ICD-10-CM | POA: Diagnosis not present

## 2016-05-13 DIAGNOSIS — I739 Peripheral vascular disease, unspecified: Principal | ICD-10-CM

## 2016-05-13 LAB — VAS US CAROTID
LCCAPDIAS: 0 cm/s
LCCAPSYS: 121 cm/s
LEFT ECA DIAS: -24 cm/s
LEFT VERTEBRAL DIAS: -7 cm/s
LICAPDIAS: -33 cm/s
LICAPSYS: -87 cm/s
Left CCA dist dias: -16 cm/s
Left CCA dist sys: -56 cm/s
Left ICA dist dias: -14 cm/s
Left ICA dist sys: -57 cm/s
RCCAPDIAS: 0 cm/s
RIGHT ECA DIAS: 0 cm/s
RIGHT VERTEBRAL DIAS: -10 cm/s
Right CCA prox sys: 121 cm/s
Right cca dist sys: -118 cm/s

## 2016-05-22 NOTE — Procedures (Signed)
  [place] A. Merlene Laughter, MD     [email or web address]        NOCTURNAL POLYSOMNOGRAM    LOCATION: SLEEP LAB FACILITY: [facility]   PHYSICIAN: Amalio Loe A. Merlene Laughter, M.D.   DATE OF STUDY: 05/03/16   REFERRING PHYSICIAN: Dorris Carnes.   INDICATIONS: 60 yo with snoring and fatigue.  MEDICATIONS:  Prior to Admission medications   Medication Sig Start Date End Date Taking? Authorizing Provider  allopurinol (ZYLOPRIM) 300 MG tablet Take 300 mg by mouth daily.    Historical Provider, MD  ALPRAZolam Duanne Moron) 0.5 MG tablet Take 0.5 mg by mouth at bedtime.      Historical Provider, MD  aspirin 81 MG EC tablet Take 1 tablet (81 mg total) by mouth daily. 02/13/13   Herminio Commons, MD  Cholecalciferol (VITAMIN D-3) 1000 UNITS CAPS Take 1 capsule by mouth daily.    Historical Provider, MD  diclofenac (VOLTAREN) 75 MG EC tablet Take 75 mg by mouth 2 (two) times daily.      Historical Provider, MD  fluticasone (FLONASE) 50 MCG/ACT nasal spray Place 2 sprays into the nose daily.      Historical Provider, MD  folic acid (FOLVITE) 1 MG tablet TAKE ONE TABLET BY MOUTH EVERY DAY 02/07/12   Ezra Sites, MD  lisinopril (PRINIVIL,ZESTRIL) 40 MG tablet TAKE ONE TABLET BY MOUTH ONCE DAILY 06/04/15   Herminio Commons, MD  loratadine (CLARITIN) 10 MG tablet Take 10 mg by mouth daily.      Historical Provider, MD  magnesium oxide (MAG-OX) 400 MG tablet Take 400 mg by mouth daily.    Historical Provider, MD  Multiple Vitamin (MULTIVITAMIN) tablet Take 1 tablet by mouth daily.      Historical Provider, MD  nitroGLYCERIN (NITROSTAT) 0.4 MG SL tablet Place 1 tablet (0.4 mg total) under the tongue every 5 (five) minutes as needed. 08/19/12   Minus Breeding, MD  Omega-3 Fatty Acids (FISH OIL) 1200 MG CAPS Take 1 capsule by mouth 2 (two) times daily.      Historical Provider, MD  oxyCODONE-acetaminophen (PERCOCET) 5-325 MG tablet Take 1-2 tablets by mouth every 4 (four) hours as needed. 04/20/15    Margarita Mail, PA-C  polyethylene glycol (MIRALAX / GLYCOLAX) packet Take 17 g by mouth daily as needed for mild constipation.     Historical Provider, MD  simvastatin (ZOCOR) 40 MG tablet Take 40 mg by mouth at bedtime.      Historical Provider, MD  triamterene-hydrochlorothiazide (MAXZIDE-25) 37.5-25 MG per tablet Take 1 tablet by mouth daily.      Historical Provider, MD      ARCHITECTURAL SUMMARY: Total recording time was 393 minutes. Sleep efficiency 89 %. Sleep latency 10 minutes. REM latency 133 minutes. Stage NI 14 %, N2 74 % and N3 8 % and REM sleep 4 %.    RESPIRATORY DATA:  Baseline oxygen saturation is 98 %. The lowest saturation is 86 %. The diagnostic AHI is 29.  The REM AHI is 48.  LIMB MOVEMENT SUMMARY: PLM index 0.   ELECTROCARDIOGRAM SUMMARY: No significant dysrhythmias observed.   IMPRESSION:  1. Moderate obstructive sleep apnea syndrome worse during REM sleep is observed. AutoPAP 10-20 is suggested.   Thanks for this referral.  Rolin Schult A. Merlene Laughter, M.D. Diplomat, Tax adviser of Sleep Medicine.

## 2016-05-27 ENCOUNTER — Encounter: Payer: Self-pay | Admitting: Pulmonary Disease

## 2016-05-27 ENCOUNTER — Ambulatory Visit (INDEPENDENT_AMBULATORY_CARE_PROVIDER_SITE_OTHER): Payer: BLUE CROSS/BLUE SHIELD | Admitting: Pulmonary Disease

## 2016-05-27 DIAGNOSIS — G4733 Obstructive sleep apnea (adult) (pediatric): Secondary | ICD-10-CM

## 2016-05-27 DIAGNOSIS — Z9989 Dependence on other enabling machines and devices: Secondary | ICD-10-CM | POA: Diagnosis not present

## 2016-05-27 NOTE — Patient Instructions (Signed)
Obtain CPAP download Decrease ramp time to 5 minutes  Based on download, we will [facility] to [facility] machine

## 2016-05-27 NOTE — Progress Notes (Signed)
Subjective:    Patient ID: Martin [name], male    DOB: 28-Oct-1955, 60 y.o.   MRN: [id number]  HPI  Chief Complaint  Patient presents with  . Sleep Consult    Referred by Dr. Dorris Carnes, Pt. currently uses a cpap machine, Pt. states he is freq. waking up with the machine, DME: [facility]    60 year old obese man presents for management of obstructive sleep apnea. He had a  sleep study in 2001 when he had a heart attack and has been maintained on CPAP since then. He uses nasal pillows and is not sure if this CPAP pressure but thinks that it may be 14 cm, his DME is [facility]. He would like to establish with [place] and would like to obtain a new machine  He has coronary artery disease with stents  PSG 04/2016 at [facility] showed AHI of 29/hour with lowest desaturation of 86%  He is compliant with his CPAP machine every night Epworth sleepiness score is 3 and he denies somnolence or fatigue or problems driving Bedtime is between 10:50 PM, sleep latency is about 20 minutes, he takes 0.5 mg of Xanax every night for at least the last 5 years, he sleeps on his back with one pillow, reports 2-3 nocturnal awakenings and is out of bed at 6 AM without headaches or dryness of mouth. He does use the humidity in his machine every night. His weight has been unchanged over the last few years  There is no history suggestive of cataplexy, sleep paralysis or parasomnias He drinks 1-2 cups of coffee in the morning and denies excessive caffeinated beverages        Past Medical History:  Diagnosis Date  . CAD (coronary artery disease)    there is a 82,001, status post stent to the RCA and mid LAD December 2011.  Followup Cardiolite study ejection fraction 54% with no reversible inferior defect 70-80% distal RCA not amenable to angioplasty.  . Carotid artery disease ([place])    carotid Doppler course left side 5069% stenosis  . Chest pain    January 11,2001,catheterization feeling 90% stenosis in the  LAD with otherwise nonobstructive disease. An EF of 55% with mid to distal anterior hypokinesis. The LAD was successfully stented with a bare-metal stent  . Hyperlipidemia   . Hypertension   . Low back pain   . OSA on CPAP   . Osteoarthritis   . Polysubstance abuse    use including marijuana and vicodin,which he obtained from the street  . PVC's (premature ventricular contractions)    status post Holter monitor normal sinus rhythm otherwise asymptomatic PVCs.  . Seasonal allergies      Past Surgical History:  Procedure Laterality Date  . CORONARY ANGIOPLASTY WITH STENT PLACEMENT  2011  . TONSILLECTOMY      Allergies  Allergen Reactions  . Penicillins     REACTION: rash     Social History   Social History  . Marital status: Married    Spouse name: N/A  . Number of children: N/A  . Years of education: N/A   Occupational History  .  Unemployed    works Social worker employed   Social History Main Topics  . Smoking status: Former Smoker    Packs/day: 1.00    Years: 35.00    Types: Cigarettes    Start date: 09/18/1967    Quit date: 06/09/1999  . Smokeless tobacco: Never Used  . Alcohol use 0.0 oz/week  Comment: drinks 6-8 ounces of liquor per day  . Drug use:     Types: Marijuana     Comment: smokes marijuana occassionally  . Sexual activity: Not on file   Other Topics Concern  . Not on file   Social History Narrative   No exercise      Family History  Problem Relation Age of Onset  . Stroke Mother   . Heart attack Father   . Heart attack Brother   . Other Mother     small bowel obstruction     Review of Systems  Constitutional: Negative.  Negative for fever and unexpected weight change.  HENT: Negative.  Negative for congestion, dental problem, ear pain, nosebleeds, postnasal drip, rhinorrhea, sinus pressure, sneezing, sore throat and trouble swallowing.   Eyes: Negative.  Negative for redness and itching.  Respiratory: Negative.  Negative  for cough, chest tightness, shortness of breath and wheezing.   Cardiovascular: Negative.  Negative for palpitations and leg swelling.  Gastrointestinal: Negative.  Negative for nausea and vomiting.  Endocrine: Negative.   Genitourinary: Negative.  Negative for dysuria.  Musculoskeletal: Negative.  Negative for joint swelling.  Skin: Negative.  Negative for rash.  Allergic/Immunologic: Negative.   Neurological: Negative.  Negative for headaches.  Hematological: Bruises/bleeds easily.  Psychiatric/Behavioral: Negative.  Negative for dysphoric mood. The patient is not nervous/anxious.        Objective:   Physical Exam   Gen. Pleasant, obese, in no distress, normal affect ENT - no lesions, no post nasal drip, class 2-3 airway Neck: No JVD, no thyromegaly, no carotid bruits Lungs: no use of accessory muscles, no dullness to percussion, decreased without rales or rhonchi  Cardiovascular: Rhythm regular, heart sounds  normal, no murmurs or gallops, no peripheral edema Abdomen: soft and non-tender, no hepatosplenomegaly, BS normal. Musculoskeletal: No deformities, no cyanosis or clubbing Neuro:  alert, non focal, no tremors        Assessment & Plan:

## 2016-05-27 NOTE — Assessment & Plan Note (Signed)
Obtain CPAP download Decrease ramp time to 5 minutes  Based on download, we will [facility]  Weight loss encouraged, compliance with goal of at least 4-6 hrs every night is the expectation. Advised against medications with sedative side effects Cautioned against driving when sleepy - understanding that sleepiness will vary on a day to day basis

## 2016-06-05 ENCOUNTER — Telehealth: Payer: Self-pay | Admitting: Pulmonary Disease

## 2016-06-05 DIAGNOSIS — Z9989 Dependence on other enabling machines and devices: Principal | ICD-10-CM

## 2016-06-05 DIAGNOSIS — G4733 Obstructive sleep apnea (adult) (pediatric): Secondary | ICD-10-CM

## 2016-06-05 NOTE — Telephone Encounter (Signed)
Dr [name], pt states that he never heard back about CPAP DL and new CPAP machine  Please advise if you have reviewed the DL and if okay to go ahead and order new CPAP  Thanks

## 2016-06-05 NOTE — Telephone Encounter (Signed)
Pl ensure DL is in my look at Pl leave look at folder on my desk

## 2016-06-09 NOTE — Telephone Encounter (Signed)
I checked and it's in your fuchsia colored folder

## 2016-06-09 NOTE — Telephone Encounter (Signed)
3 month download shows good compliance on 12.6 cm with no residuals and mild leak  okay to send prescription for new CPAP at 12 cm, download in 4 weeks

## 2016-06-10 NOTE — Telephone Encounter (Signed)
Called and spoke with pt and he is aware of RA recs and he is aware that new order for new cpap has been sent to [facility]

## 2016-06-11 ENCOUNTER — Telehealth: Payer: Self-pay | Admitting: Pulmonary Disease

## 2016-06-11 DIAGNOSIS — G4733 Obstructive sleep apnea (adult) (pediatric): Secondary | ICD-10-CM

## 2016-06-11 NOTE — Telephone Encounter (Signed)
lmtcb for Jason.  

## 2016-06-12 NOTE — Telephone Encounter (Signed)
[id number] [place] back

## 2016-06-12 NOTE — Telephone Encounter (Signed)
Spoke with [place] with [facility], and made him aware that a new order has been placed. Nothing further needed.

## 2016-06-12 NOTE — Telephone Encounter (Signed)
lmtcb for Jason.  

## 2016-06-16 ENCOUNTER — Other Ambulatory Visit: Payer: Self-pay | Admitting: Cardiovascular Disease

## 2016-06-23 ENCOUNTER — Institutional Professional Consult (permissible substitution): Payer: BLUE CROSS/BLUE SHIELD | Admitting: Pulmonary Disease

## 2016-12-07 ENCOUNTER — Other Ambulatory Visit: Payer: Self-pay | Admitting: *Deleted

## 2016-12-07 MED ORDER — LISINOPRIL 40 MG PO TABS: 40.0000 mg | ORAL_TABLET | Freq: Every day | ORAL | 1 refills | Status: DC

## 2017-05-05 ENCOUNTER — Encounter: Payer: Self-pay | Admitting: *Deleted

## 2017-05-05 ENCOUNTER — Ambulatory Visit: Payer: BLUE CROSS/BLUE SHIELD | Admitting: Cardiovascular Disease

## 2017-05-05 VITALS — BP 159/90 | HR 71 | Ht 67.0 in | Wt 269.6 lb

## 2017-05-05 DIAGNOSIS — Z9989 Dependence on other enabling machines and devices: Secondary | ICD-10-CM

## 2017-05-05 DIAGNOSIS — I779 Disorder of arteries and arterioles, unspecified: Secondary | ICD-10-CM

## 2017-05-05 DIAGNOSIS — Z955 Presence of coronary angioplasty implant and graft: Secondary | ICD-10-CM | POA: Diagnosis not present

## 2017-05-05 DIAGNOSIS — G4733 Obstructive sleep apnea (adult) (pediatric): Secondary | ICD-10-CM | POA: Diagnosis not present

## 2017-05-05 DIAGNOSIS — I25118 Atherosclerotic heart disease of native coronary artery with other forms of angina pectoris: Secondary | ICD-10-CM | POA: Diagnosis not present

## 2017-05-05 DIAGNOSIS — E785 Hyperlipidemia, unspecified: Secondary | ICD-10-CM | POA: Diagnosis not present

## 2017-05-05 DIAGNOSIS — I10 Essential (primary) hypertension: Secondary | ICD-10-CM

## 2017-05-05 DIAGNOSIS — I739 Peripheral vascular disease, unspecified: Secondary | ICD-10-CM

## 2017-05-05 MED ORDER — NITROGLYCERIN 0.4 MG SL SUBL: 0.4000 mg | SUBLINGUAL_TABLET | SUBLINGUAL | 3 refills | Status: DC | PRN

## 2017-05-05 NOTE — Progress Notes (Signed)
SUBJECTIVE: The patient presents for follow-up of coronary artery disease. He had drug-eluting stents placed to the RCA and mid LAD in November 2011.  I last saw him in November 2016.  He saw Dr. Harrington Challenger in 2017.  Carotid Dopplers demonstrated mild bilateral plaque disease in December 2017.    He had a sleep study in November 2017 which demonstrated moderate obstructive sleep apnea.  ECG performed today which I personally interpreted demonstrated sinus rhythm with diffuse nonspecific T wave abnormalities.  The patient denies any symptoms of chest pain, palpitations, shortness of breath, lightheadedness, dizziness, leg swelling, orthopnea, PND, and syncope.  He said his blood pressure was 130/78 about 2 weeks ago at his PCPs office.  He said he was rushing around to get here on time today.  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  For the first 14 years, he did not take a week off.  He is scheduled to see a neurologist to follow-up for sleep apnea next month.     Review of Systems: As per "subjective", otherwise negative.  Allergies  Allergen Reactions  . Penicillins     REACTION: rash    Current Outpatient Medications  Medication Sig Dispense Refill  . allopurinol (ZYLOPRIM) 300 MG tablet Take 300 mg by mouth daily.    Marland Kitchen ALPRAZolam (XANAX) 0.5 MG tablet Take 0.5 mg by mouth at bedtime.      Marland Kitchen aspirin 81 MG EC tablet Take 1 tablet (81 mg total) by mouth daily.    Marland Kitchen atorvastatin (LIPITOR) 40 MG tablet TAKE 1 TABLET ONCE A DAY AT BEDTIME FOR CHOLESTEROL  3  . Cholecalciferol (VITAMIN D-3) 1000 UNITS CAPS Take 1 capsule by mouth daily.    . diclofenac (VOLTAREN) 75 MG EC tablet Take 75 mg by mouth 2 (two) times daily.      . fluticasone (FLONASE) 50 MCG/ACT nasal spray Place 2 sprays into the nose daily.      . folic acid (FOLVITE) 1 MG tablet TAKE ONE TABLET BY MOUTH EVERY DAY 30 tablet 6  . lisinopril (PRINIVIL,ZESTRIL) 40 MG tablet Take 1 tablet (40  mg total) by mouth daily. 90 tablet 1  . loratadine (CLARITIN) 10 MG tablet Take 10 mg by mouth daily.      . magnesium oxide (MAG-OX) 400 MG tablet Take 400 mg by mouth daily.    . Multiple Vitamin (MULTIVITAMIN) tablet Take 1 tablet by mouth daily.      . nitroGLYCERIN (NITROSTAT) 0.4 MG SL tablet Place 1 tablet (0.4 mg total) under the tongue every 5 (five) minutes as needed. 25 tablet 0  . Omega-3 Fatty Acids (FISH OIL) 1200 MG CAPS Take 1 capsule by mouth 2 (two) times daily.      . polyethylene glycol (MIRALAX / GLYCOLAX) packet Take 17 g by mouth daily as needed for mild constipation.     . triamterene-hydrochlorothiazide (MAXZIDE-25) 37.5-25 MG per tablet Take 1 tablet by mouth daily.       No current facility-administered medications for this visit.     Past Medical History:  Diagnosis Date  . CAD (coronary artery disease)    there is a 82,001, status post stent to the RCA and mid LAD December 2011.  Followup Cardiolite study ejection fraction 54% with no reversible inferior defect 70-80% distal RCA not amenable to angioplasty.  . Carotid artery disease ([place])    carotid Doppler course left side 5069% stenosis  .  Chest pain    January 11,2001,catheterization feeling 90% stenosis in the LAD with otherwise nonobstructive disease. An EF of 55% with mid to distal anterior hypokinesis. The LAD was successfully stented with a bare-metal stent  . Hyperlipidemia   . Hypertension   . Low back pain   . OSA on CPAP   . Osteoarthritis   . Polysubstance abuse ([place])    use including marijuana and vicodin,which he obtained from the street  . PVC's (premature ventricular contractions)    status post Holter monitor normal sinus rhythm otherwise asymptomatic PVCs.  . Seasonal allergies     Past Surgical History:  Procedure Laterality Date  . CORONARY ANGIOPLASTY WITH STENT PLACEMENT  2011  . TONSILLECTOMY      Social History   Socioeconomic History  . Marital status: Married    Spouse  name: Not on file  . Number of children: Not on file  . Years of education: Not on file  . Highest education level: Not on file  Social Needs  . Financial resource strain: Not on file  . Food insecurity - worry: Not on file  . Food insecurity - inability: Not on file  . Transportation needs - medical: Not on file  . Transportation needs - non-medical: Not on file  Occupational History    Employer: UNEMPLOYED    Comment: works Social worker employed  Tobacco Use  . Smoking status: Former Smoker    Packs/day: 1.00    Years: 35.00    Pack years: 35.00    Types: Cigarettes    Start date: 09/18/1967    Last attempt to quit: 06/09/1999    Years since quitting: 17.9  . Smokeless tobacco: Never Used  Substance and Sexual Activity  . Alcohol use: Yes    Alcohol/week: 0.0 oz    Comment: drinks 6-8 ounces of liquor per day  . Drug use: Yes    Types: Marijuana    Comment: smokes marijuana occassionally  . Sexual activity: Not on file  Other Topics Concern  . Not on file  Social History Narrative   No exercise     Vitals:   05/05/17 1518  BP: (!) 159/90  Pulse: 71  Weight: 269 lb 9.6 oz (122.3 kg)  Height: 5\' 7"  (1.702 m)    Wt Readings from Last 3 Encounters:  05/05/17 269 lb 9.6 oz (122.3 kg)  05/27/16 271 lb 6.4 oz (123.1 kg)  04/20/16 267 lb (121.1 kg)     PHYSICAL EXAM General: NAD HEENT: Normal. Neck: No JVD, no thyromegaly. Lungs: Clear to auscultation bilaterally with normal respiratory effort. CV: Regular rate and rhythm, normal S1/S2, no S3/S4, no murmur. No pretibial or periankle edema.  No carotid bruit.   Abdomen: Obese. Neurologic: Alert and oriented.  Psych: Normal affect. Skin: Normal. Musculoskeletal: No gross deformities.    ECG: Most recent ECG reviewed.   Labs: Lab Results  Component Value Date/Time   K 4.3 06/01/2014 12:14 PM   BUN 28 (H) 06/01/2014 12:14 PM   CREATININE 1.21 06/01/2014 12:14 PM   ALT 35 05/20/2010 01:48 PM   TSH  2.546 05/20/2010 07:38 PM   HGB 13.0 06/01/2014 12:14 PM     Lipids: Lab Results  Component Value Date/Time   LDLCALC  05/21/2010 05:30 AM    72        Total Cholesterol/HDL:CHD Risk Coronary Heart Disease Risk Table  Men   Women  1/2 Average Risk   3.4   3.3  Average Risk       5.0   4.4  2 X Average Risk   9.6   7.1  3 X Average Risk  23.4   11.0        Use the calculated Patient Ratio above and the CHD Risk Table to determine the patient's CHD Risk.        ATP III CLASSIFICATION (LDL):  <100     mg/dL   Optimal  100-129  mg/dL   Near or Above                    Optimal  130-159  mg/dL   Borderline  160-189  mg/dL   High  >190     mg/dL   Very High   CHOL  05/21/2010 05:30 AM    168        ATP III CLASSIFICATION:  <200     mg/dL   Desirable  200-239  mg/dL   Borderline High  >=240    mg/dL   High          TRIG 291 (H) 05/21/2010 05:30 AM   HDL 38 (L) 05/21/2010 05:30 AM       ASSESSMENT AND PLAN:  1.  Coronary artery disease with prior stent placement: Symptomatically stable.  Continue aspirin and statin.   2.  Chronic hypertension: Markedly elevated. He said his blood pressure was 130/78 about 2 weeks ago at his PCPs office.  He said he was rushing around to get here on time today. He is already taking triamterene, hydrochlorothiazide, and lisinopril.  No changes to therapy.  3.  Hyperlipidemia: I will obtain a copy of lipids from PCP.  Continue statin therapy.  4.  Bilateral carotid artery stenosis: Mild in December 2017.  This can be repeated in 2 years.  5.  Obstructive sleep apnea: Sleep study results detailed above.  He uses CPAP.  He will see his neurologist 1 month.   Disposition: Follow up 1 year   Kate Sable, M.D., F.A.C.C.

## 2017-05-05 NOTE — Patient Instructions (Signed)
Your physician wants you to follow-up in: 1 YEAR WITH DR KONESWARAN You will receive a reminder letter in the mail two months in advance. If you don't receive a letter, please call our office to schedule the follow-up appointment.  Your physician recommends that you continue on your current medications as directed. Please refer to the Current Medication list given to you today.  Thank you for choosing [facility]!!    

## 2017-05-11 ENCOUNTER — Other Ambulatory Visit: Payer: Self-pay

## 2017-05-11 ENCOUNTER — Telehealth: Payer: Self-pay | Admitting: *Deleted

## 2017-05-11 MED ORDER — ATORVASTATIN CALCIUM 80 MG PO TABS: 80.0000 mg | ORAL_TABLET | Freq: Every day | ORAL | 6 refills | Status: DC

## 2017-05-11 MED ORDER — NITROGLYCERIN 0.4 MG SL SUBL: 0.4000 mg | SUBLINGUAL_TABLET | SUBLINGUAL | 3 refills | Status: DC | PRN

## 2017-05-11 NOTE — Telephone Encounter (Signed)
Received labs from pmd - per Dr. Bronson Ing review - increase Lipitor to 80mg  daily.  Repeat Lipids in 3 months.    Patient notified and verbalized understanding.  Will send new prescription to [facility] & will mail reminder when time to repeat labs.

## 2017-05-23 ENCOUNTER — Encounter: Payer: Self-pay | Admitting: Pulmonary Disease

## 2017-05-24 ENCOUNTER — Encounter: Payer: Self-pay | Admitting: Pulmonary Disease

## 2017-05-24 ENCOUNTER — Ambulatory Visit: Payer: BLUE CROSS/BLUE SHIELD | Admitting: Pulmonary Disease

## 2017-05-24 DIAGNOSIS — G4733 Obstructive sleep apnea (adult) (pediatric): Secondary | ICD-10-CM | POA: Diagnosis not present

## 2017-05-24 DIAGNOSIS — Z9989 Dependence on other enabling machines and devices: Secondary | ICD-10-CM | POA: Diagnosis not present

## 2017-05-24 NOTE — Patient Instructions (Signed)
CPAP is set at 12 cm and seems to be working well. CPAP supplies will be renewed for a year

## 2017-05-24 NOTE — Progress Notes (Signed)
   Subjective:    Patient ID: Martin [name], male    DOB: April 19, 1956, 61 y.o.   MRN: [id number]  HPI  61 year old obese man with CAD for FU of obstructive sleep apnea. He had a  sleep study in 2001 when he had a heart attack and has been maintained on CPAP since then.   On his last visit in 2017, we got him a new machine on CPAP 12 cm.  He has been using this with nasal pillows and reports and this is working well for him.  He reports good compliance.  No problems with mask or pressure.  No problems with dryness of mouth. His weight has increased by a few pounds. CPAP download was reviewed which shows excellent control of events on 12 cm and mild leak with good compliance    Significant tests/ events reviewed  PSG 04/2016 at [facility] showed AHI of 29/hour with lowest desaturation of 86%  Past Medical History:  Diagnosis Date  . CAD (coronary artery disease)    there is a 82,001, status post stent to the RCA and mid LAD December 2011.  Followup Cardiolite study ejection fraction 54% with no reversible inferior defect 70-80% distal RCA not amenable to angioplasty.  . Carotid artery disease ([place])    carotid Doppler course left side 5069% stenosis  . Chest pain    January 11,2001,catheterization feeling 90% stenosis in the LAD with otherwise nonobstructive disease. An EF of 55% with mid to distal anterior hypokinesis. The LAD was successfully stented with a bare-metal stent  . Hyperlipidemia   . Hypertension   . Low back pain   . OSA on CPAP   . Osteoarthritis   . Polysubstance abuse ([place])    use including marijuana and vicodin,which he obtained from the street  . PVC's (premature ventricular contractions)    status post Holter monitor normal sinus rhythm otherwise asymptomatic PVCs.  . Seasonal allergies      Review of Systems Patient denies significant dyspnea,cough, hemoptysis,  chest pain, palpitations, pedal edema, orthopnea, paroxysmal nocturnal dyspnea,  lightheadedness, nausea, vomiting, abdominal or  leg pains      Objective:   Physical Exam  Gen. Pleasant, obese, in no distress ENT - no lesions, no post nasal drip Neck: No JVD, no thyromegaly, no carotid bruits Lungs: no use of accessory muscles, no dullness to percussion, decreased without rales or rhonchi  Cardiovascular: Rhythm regular, heart sounds  normal, no murmurs or gallops, no peripheral edema Musculoskeletal: No deformities, no cyanosis or clubbing , no tremors       Assessment & Plan:

## 2017-05-24 NOTE — Assessment & Plan Note (Signed)
CPAP is set at 12 cm and seems to be working well. CPAP supplies will be renewed for a year  Weight loss encouraged, compliance with goal of at least 4-6 hrs every night is the expectation. Advised against medications with sedative side effects Cautioned against driving when sleepy - understanding that sleepiness will vary on a day to day basis

## 2017-07-24 ENCOUNTER — Other Ambulatory Visit: Payer: Self-pay | Admitting: Internal Medicine

## 2017-08-12 ENCOUNTER — Encounter: Payer: Self-pay | Admitting: *Deleted

## 2017-08-12 ENCOUNTER — Other Ambulatory Visit: Payer: Self-pay | Admitting: *Deleted

## 2017-08-12 DIAGNOSIS — E785 Hyperlipidemia, unspecified: Secondary | ICD-10-CM

## 2017-08-19 ENCOUNTER — Encounter: Payer: Self-pay | Admitting: *Deleted

## 2017-09-02 ENCOUNTER — Telehealth: Payer: Self-pay | Admitting: *Deleted

## 2017-09-02 NOTE — Telephone Encounter (Signed)
Patient due for Lipids this month.  Mailed lab order to home, no results received to date.  Last thing done with pmd was 09/30/2015 and last thing done with [facility] was a platelet level done 12/01/2016.

## 2017-09-22 ENCOUNTER — Telehealth: Payer: Self-pay | Admitting: *Deleted

## 2017-09-22 NOTE — Telephone Encounter (Signed)
Notes recorded by Laurine Blazer, LPN on 07/16/221 at 3:61 PM EDT Patient notified. Copy to pmd. ------  Notes recorded by Herminio Commons, MD on 09/21/2017 at 5:20 PM EDT Good.

## 2017-10-28 ENCOUNTER — Ambulatory Visit: Payer: BLUE CROSS/BLUE SHIELD | Admitting: Physician Assistant

## 2017-10-28 ENCOUNTER — Ambulatory Visit (HOSPITAL_COMMUNITY)
Admission: RE | Admit: 2017-10-28 | Discharge: 2017-10-28 | Disposition: A | Discharge status: Home / Self Care | Payer: BLUE CROSS/BLUE SHIELD | Source: Ambulatory Visit | Attending: Physician Assistant | Admitting: Physician Assistant

## 2017-10-28 ENCOUNTER — Encounter: Payer: Self-pay | Admitting: Physician Assistant

## 2017-10-28 VITALS — BP 135/97 | HR 76 | Ht 67.5 in | Wt 265.5 lb

## 2017-10-28 DIAGNOSIS — I2510 Atherosclerotic heart disease of native coronary artery without angina pectoris: Secondary | ICD-10-CM

## 2017-10-28 DIAGNOSIS — R0989 Other specified symptoms and signs involving the circulatory and respiratory systems: Secondary | ICD-10-CM | POA: Insufficient documentation

## 2017-10-28 DIAGNOSIS — E785 Hyperlipidemia, unspecified: Secondary | ICD-10-CM | POA: Diagnosis not present

## 2017-10-28 DIAGNOSIS — I10 Essential (primary) hypertension: Secondary | ICD-10-CM

## 2017-10-28 DIAGNOSIS — I481 Persistent atrial fibrillation: Secondary | ICD-10-CM

## 2017-10-28 DIAGNOSIS — I4819 Other persistent atrial fibrillation: Secondary | ICD-10-CM

## 2017-10-28 MED ORDER — APIXABAN 5 MG PO TABS: 5.0000 mg | ORAL_TABLET | Freq: Two times a day (BID) | ORAL | 11 refills | Status: DC

## 2017-10-28 NOTE — Patient Instructions (Addendum)
Medication Instructions:  Your physician has recommended you make the following change in your medication:  Start Eliquis 5 mg Two Times Daily    Labwork: NONE   Testing/Procedures: A chest x-ray takes a picture of the organs and structures inside the chest, including the heart, lungs, and blood vessels. This test can show several things, including, whether the heart is enlarges; whether fluid is building up in the lungs; and whether pacemaker / defibrillator leads are still in place.  Your physician has requested that you have an echocardiogram. Echocardiography is a painless test that uses sound waves to create images of your heart. It provides your doctor with information about the size and shape of your heart and how well your heart's chambers and valves are working. This procedure takes approximately one hour. There are no restrictions for this procedure.    Follow-Up: Your physician recommends that you schedule a follow-up appointment with Bernerd Pho PA-C    Any Other Special Instructions Will Be Listed Below (If Applicable).     If you need a refill on your cardiac medications before your next appointment, please call your pharmacy.   Patients with heart disease should avoid stimulant-type medicines. This includes medicines like Adderall or even cold/allergy medicines that contain pseudoephedrine or phenylephrine. (Sometimes on the bottle it will say "-D" to indicate the decongestant, which you'll need to avoid.) Please make sure to pay attention to labels.  Patients taking blood thinners should generally stay away from medicines like ibuprofen, Advil, Motrin, naproxen, and Aleve due to risk of stomach bleeding. You may take Tylenol as directed or talk to your primary doctor about alternatives.  If you notice any bleeding such as blood in stool, black tarry stools, blood in urine, nosebleeds or any other unusual bleeding, call your doctor immediately.  We would strongly  recommend you cut down your alcohol intake.  Keep a log of your blood pressures at different times of the day at home and bring this into your follow-up appointment.

## 2017-10-28 NOTE — Progress Notes (Signed)
Cardiology Office Note    Date:  10/28/2017  ID:  Martin [name], Martin [name], Martin [name], Martin [name] PCP:  Deloria Lair., MD  Cardiologist:  Previously Dr. Bronson Ing and Harrington Challenger  Chief Complaint: atrial fib evaluation  History of Present Illness:  Martin [name] is a 62 y.o. male with history of CAD (PTCA/stenting of LAD 2001, DES to RCA/mLAD 2011), mild carotid disease by duplex 05/2016, OSA, habitual alcohol use, HTN, HLD, prior street use of THC/Vicodin, asymptomatic PVCs by prior [place] who presents as an Martin [name]-on to clinic for evaluation of new onset atrial fib.  He has history of coronary disease as above. At time of cath in 2011 there was 70-80% distal RCA stenosis not amenable to PCI. 2D echo 02/2013 showed mild-mod LVH, EF 55-60%, unable to assess for WMA. Nuclear stress test at the same time was abnormal with fixed defect seen in inferoapical, mid-inferior, and basal inferior walls, indicative of myocardial scar, no evidence of ischemia, EF 41%. He was managed medically. Carotid US 05/2016 showed stable 1-39% ICA stenosis, bilaterally, >50% ECA stenosis, bilaterally, f/u PRN recommended - Dr. Court Joy notes indicate he plans to reassess around 05/2018. Primary care contacted our office to have the patient seen.   Per notes, the patient developed vertigo recently and was seen in primary care office on 5/17 and was noted to be in atrial fibrillation with controlled ventricular response. He also was complaining of nasal congestion and raspy cough. He was sent to the emergency room for further evaluation and was admitted for observation. Notes indicate mildly elevated troponins of 0.06-0.07-0.08-0.07-0.06. Some of the values sent in the labs appear to be <0.01 as well. Labs sent otherwise showed glucose 111, BUN 16, Cr 0.70, Na 136, K 3.9, normal LFTs, Mg 1.9, WBc 5.7, TSH wnl 3.2, Hgb [name].0, Plt 151, INR 1.1. CXR cardiomegaly without active disease. CT angio head neck was nonacute. He  was not hypoxic. He was continued on his usual doses of aspirin and atorvastatin. I do not see any discussions regarding anticoagulation. CHADSVASC is 2 for HTN and CAD. He was discharged home with recommendation for early follow-up with cardiology. He was seen by primary care today who became concerned that the appointment had not yet occurred so we added him onto my schedule.  He returns for follow-up overall feeling fairly well. It is not clear exactly when his afib began as he cannot really feel it. He has h/o vertigo symptoms in the past even in NSR. He continues to have a mostly nonproductive cough but the congestion has been improving. He denies any recent or current CP, SOB, orthopnea, LEE. He has a mild vague dizziness but also has a h/o vertigo. The dizziness is not particularly severe. No fevers. He's been taking Mucinex without a decongestant. Reports compliance with CPAP. Drinks 3-4oz liquor (Vodka) daily.   Past Medical History:  Diagnosis Date  . Atrial fibrillation ([place])    a. dx 10/2017.  Marland Kitchen CAD (coronary artery disease)    a. s/p PCI to LAD 2001. b. s/p stenting to the RCA and mid LAD December 2011, residual distal RCA disease treated medically. b. nuc 02/2013 abnormal with fixed defect seen in inferoapical, mid-inferior, and basal inferior walls, indicative of myocardial scar, no evidence of ischemia, EF 41% (Ef 55-60% by echo same time).  . Carotid artery disease ([place])    carotid Doppler course left side 5069% stenosis  . Habitual alcohol use   . Hyperlipidemia   . Hypertension   .  Low back pain   . OSA on CPAP   . Osteoarthritis   . Polysubstance abuse ([place])    use including marijuana and vicodin,which he obtained from the street  . PVC's (premature ventricular contractions)    status post Holter monitor normal sinus rhythm otherwise asymptomatic PVCs.  . Seasonal allergies     Past Surgical History:  Procedure Laterality Date  . CORONARY ANGIOPLASTY WITH STENT PLACEMENT   2011  . TONSILLECTOMY      Current Medications: Current Meds  Medication Sig  . allopurinol (ZYLOPRIM) 300 MG tablet Take 300 mg by mouth daily.  Marland Kitchen ALPRAZolam (XANAX) 0.5 MG tablet Take 0.5 mg by mouth at bedtime.    Marland Kitchen aspirin 81 MG EC tablet Take 1 tablet (81 mg total) by mouth daily.  Marland Kitchen atorvastatin (LIPITOR) 80 MG tablet Take 1 tablet (80 mg total) by mouth daily.  . Cholecalciferol (VITAMIN D-3) 1000 UNITS CAPS Take 1 capsule by mouth daily.  . fluticasone (FLONASE) 50 MCG/ACT nasal spray Place 2 sprays into the nose daily.    . folic acid (FOLVITE) 1 MG tablet TAKE ONE TABLET BY MOUTH EVERY DAY  . lisinopril (PRINIVIL,ZESTRIL) 40 MG tablet TAKE 1 TABLET BY MOUTH EVERY DAY  . loratadine (CLARITIN) 10 MG tablet Take 10 mg by mouth daily.    . magnesium oxide (MAG-OX) 400 MG tablet Take 400 mg by mouth daily.  . meclizine (ANTIVERT) 32 MG tablet Take 32 mg by mouth 3 (three) times daily as needed for dizziness.  . Multiple Vitamin (MULTIVITAMIN) tablet Take 1 tablet by mouth daily.    . nitroGLYCERIN (NITROSTAT) 0.4 MG SL tablet Place 1 tablet (0.4 mg total) under the tongue every 5 (five) minutes as needed.  . Omega-3 Fatty Acids (FISH OIL) 1200 MG CAPS Take 1 capsule by mouth 2 (two) times daily.    . polyethylene glycol (MIRALAX / GLYCOLAX) packet Take 17 g by mouth daily as needed for mild constipation.   . triamterene-hydrochlorothiazide (MAXZIDE-25) 37.5-25 MG per tablet Take 1 tablet by mouth daily.     Allergies:   Penicillins   Social History   Socioeconomic History  . Marital status: Married    Spouse name: Not on file  . Number of children: Not on file  . Years of education: Not on file  . Highest education level: Not on file  Occupational History    Employer: UNEMPLOYED    Comment: works Social worker employed  Social Needs  . Financial resource strain: Not on file  . Food insecurity:    Worry: Not on file    Inability: Not on file  . Transportation needs:     Medical: Not on file    Non-medical: Not on file  Tobacco Use  . Smoking status: Former Smoker    Packs/day: 1.00    Years: 35.00    Pack years: 35.00    Types: Cigarettes    Start date: 09/18/1967    Last attempt to quit: 06/09/1999    Years since quitting: 18.4  . Smokeless tobacco: Never Used  Substance and Sexual Activity  . Alcohol use: Yes    Alcohol/week: 0.0 oz    Comment: drinks 6-8 ounces of liquor per day  . Drug use: Yes    Types: Marijuana    Comment: smokes marijuana occassionally  . Sexual activity: Not on file  Lifestyle  . Physical activity:    Days per week: Not on file    Minutes per session: Not on  file  . Stress: Not on file  Relationships  . Social connections:    Talks on phone: Not on file    Gets together: Not on file    Attends religious service: Not on file    Active member of club or organization: Not on file    Attends meetings of clubs or organizations: Not on file    Relationship status: Not on file  Other Topics Concern  . Not on file  Social History Narrative   No exercise     Family History:  The patient's family history includes Heart attack in his brother and father; Other in his mother; Stroke in his mother.  ROS:   Please see the history of present illness.  All other systems are reviewed and otherwise negative.    PHYSICAL EXAM:   VS:  BP (!) 135/97   Pulse 76   Ht 5' 7.5" (1.715 m)   Wt 265 lb 8 oz (120.4 kg)   SpO2 96%   BMI 40.97 kg/m   BMI: Body mass index is 40.97 kg/m. GEN: Well nourished, well developed obese WM in no acute distress HEENT: normocephalic, atraumatic Neck: no JVD, carotid bruits, or masses Cardiac: irregularly irregular RRR; no murmurs, rubs, or gallops, no edema  Respiratory:  Faint intermittent rhonchi upon exhalation, no wheezing or rales, good air movement, normal work of breathing GI: soft, nontender, nondistended, + BS MS: no deformity or atrophy Skin: warm and dry, no rash Neuro:   Alert and Oriented x 3, Strength and sensation are intact, follows commands Psych: euthymic mood, full affect  Wt Readings from Last 3 Encounters:  10/28/17 265 lb 8 oz (120.4 kg)  05/24/17 271 lb 12.8 oz (123.3 kg)  05/05/17 269 lb 9.6 oz (122.3 kg)      Studies/Labs Reviewed:   EKG:  EKG was ordered today and personally reviewed by me and demonstrates atrial fib 83bpm, one PVC, nonspecific ST-T changes. ST changes appear similar to prior.  Recent Labs: No results found for requested labs within last 8760 hours.   Lipid Panel    Component Value Date/Time   CHOL  12/[name]/2011 0530    168        ATP III CLASSIFICATION:  <200     mg/dL   Desirable  200-239  mg/dL   Borderline High  >=240    mg/dL   High          TRIG 291 (H) 12/[name]/2011 0530   HDL 38 (L) 12/[name]/2011 0530   CHOLHDL 4.4 12/[name]/2011 0530   VLDL 58 (H) 12/[name]/2011 0530   LDLCALC  12/[name]/2011 0530    72        Total Cholesterol/HDL:CHD Risk Coronary Heart Disease Risk Table                     Men   Women  1/2 Average Risk   3.4   3.3  Average Risk       5.0   4.4  2 X Average Risk   9.6   7.1  3 X Average Risk  23.4   11.0        Use the calculated Patient Ratio above and the CHD Risk Table to determine the patient's CHD Risk.        ATP III CLASSIFICATION (LDL):  <100     mg/dL   Optimal  100-129  mg/dL   Near or Above  Optimal  130-159  mg/dL   Borderline  160-189  mg/dL   High  >190     mg/dL   Very High    Additional studies/ records that were reviewed today include: Summarized above.    ASSESSMENT & PLAN:   1. New onset persistent atrial fibrillation - CHADSVASC 2. He is minimally symptomatic (I'm not sure his dizziness has anything to do with his atrial fib). Recent labs OK as above. Will obtain 2D echocardiogram. Discussed importance of cutting down alcohol. Anticoagulation risks/benefits reviewed in depth, will start Eliquis 5mg  BID (he feels this works better than Xarelto for  his schedule of meds he's already taking). Will see him back in several weeks' time - if still in afib, would go ahead and schedule cardioversion after at least 3 weeks of uninterrupted anticoagulation. Discussed importance of NO MISSED DOSES. Also reviewed NSAID precautions (see AVS). No prior hx of major bleeding. Discussed exercising caution with work and everyday life while on a blood thinner. 2. CAD with recently minimally elevated troponin - he is not describing any angina whatsoever. He did have residual distal RCA disease, question if stress of URI/afib caused troponin leak. Will check echocardiogram. If there has been change in LV function may need to consider updating ischemic eval. Will continue ASA for now with surveillance for any bleeding until echo is back. 3. HTN - BP elevated in clinic today. He reports it was in the 111-117 range at [facility]. Recheck by me was right at 130/80. We discussed making a BP log and bringing it to next OV. Contingent on LV function, amlodipine would be a good choice. 4. Hyperlipidemia - continue statin. 5. Abnormal lung sounds - ? Possible rhonchi. He continues to have a raspy cough. Will obtain f/u 2V CXR. As he otherwise feels well, I will defer further management to primary care who recommended supportive care of his URI. Also gave instructions on which decongestants to avoid.  Disposition: F/u with [facility] as scheduled (appt availability has been limited so will keep pre-existing appt).   Medication Adjustments/Labs and Tests Ordered: Current medicines are reviewed at length with the patient today.  Concerns regarding medicines are outlined above. Medication changes, Labs and Tests ordered today are summarized above and listed in the Patient Instructions accessible in Encounters.   Signed, Charlie Pitter, PA-C  10/28/2017 11:38 AM    [facility] Location in [place],  [place] [address] Ph: [phone]; Fax [phone]

## 2017-11-02 ENCOUNTER — Ambulatory Visit (HOSPITAL_COMMUNITY)
Admission: RE | Admit: 2017-11-02 | Discharge: 2017-11-02 | Disposition: A | Discharge status: Home / Self Care | Payer: BLUE CROSS/BLUE SHIELD | Source: Ambulatory Visit | Attending: Physician Assistant | Admitting: Physician Assistant

## 2017-11-02 DIAGNOSIS — G4733 Obstructive sleep apnea (adult) (pediatric): Secondary | ICD-10-CM | POA: Insufficient documentation

## 2017-11-02 DIAGNOSIS — E785 Hyperlipidemia, unspecified: Secondary | ICD-10-CM | POA: Diagnosis not present

## 2017-11-02 DIAGNOSIS — I481 Persistent atrial fibrillation: Secondary | ICD-10-CM

## 2017-11-02 DIAGNOSIS — I493 Ventricular premature depolarization: Secondary | ICD-10-CM | POA: Insufficient documentation

## 2017-11-02 DIAGNOSIS — I2510 Atherosclerotic heart disease of native coronary artery without angina pectoris: Secondary | ICD-10-CM | POA: Diagnosis not present

## 2017-11-02 DIAGNOSIS — E669 Obesity, unspecified: Secondary | ICD-10-CM | POA: Diagnosis not present

## 2017-11-02 DIAGNOSIS — I4819 Other persistent atrial fibrillation: Secondary | ICD-10-CM

## 2017-11-02 DIAGNOSIS — I10 Essential (primary) hypertension: Secondary | ICD-10-CM | POA: Diagnosis present

## 2017-11-02 NOTE — Progress Notes (Signed)
*  PRELIMINARY RESULTS* Echocardiogram 2D Echocardiogram has been performed.  Martin [name] 11/02/2017, 10:33 AM

## 2017-11-15 ENCOUNTER — Ambulatory Visit: Payer: BLUE CROSS/BLUE SHIELD | Admitting: Physician Assistant

## 2017-11-15 NOTE — Progress Notes (Signed)
Cardiology Office Note    Date:  11/16/2017   ID:  Martin [name], DOB June 06, 1956, MRN [id number]  PCP:  Deloria Lair., MD  Cardiologist: Kate Sable, MD    Chief Complaint  Patient presents with  . Follow-up    3 week visit    History of Present Illness:    Martin [name] is a 62 y.o. male with past medical history of CAD (s/p stenting of LAD in 2001, DES to RCA and mid-LAD in 2011), carotid artery stenosis, HTN, HLD, and tobacco use who presents to the office today for 3-week follow-up.  He was last examined by Melina Copa, PA-C on 10/28/2017 following a recent hospitalization for atrial fibrillation with RVR. Labs showed no acute findings and he was not started on anticoagulation at the time of discharge.During his office visit, he reported overall feeling well and denied any recent chest pain or dyspnea. Did report some episodes of dizziness but this was noted to have occurred previously when he was in NSR. EKG showed he was in rate-controlled atrial fibrillation. He was started on Eliquis 5mg  BID for anticoagulation with consideration of a DCCV if still in atrial fibrillation following 3 weeks of anticoagulation. An echocardiogram was obtained in the interim and showed a preserved EF of 55-60%, no regional WMA, mild AS, and trivial TR.  In talking with the patient today, he reports still having episodes of dizziness and overall fatigue since being diagnosed with atrial fibrillation last month. Denies any specific palpitations, chest discomfort, or dyspnea. No recent orthopnea, PND, or lower extremity edema.  He reports good compliance with Eliquis and has experienced some episodes of epistaxis but says these have spontaneously resolved. He has a known history of hemorrhoids and has noticed occasional streaks of bright red blood on the toilet paper but denies any significant melena or hematochezia.    Past Medical History:  Diagnosis Date  . Atrial fibrillation ([place])      a. dx 10/2017.  Marland Kitchen CAD (coronary artery disease)    a. s/p PCI to LAD 2001. b. s/p stenting to the RCA and mid LAD December 2011, residual distal RCA disease treated medically. b. nuc 02/2013 abnormal with fixed defect seen in inferoapical, mid-inferior, and basal inferior walls, indicative of myocardial scar, no evidence of ischemia, EF 41% (Ef 55-60% by echo same time).  . Carotid artery disease ([place])    carotid Doppler course left side 5069% stenosis  . Habitual alcohol use   . Hyperlipidemia   . Hypertension   . Low back pain   . OSA on CPAP   . Osteoarthritis   . Polysubstance abuse ([place])    use including marijuana and vicodin,which he obtained from the street  . PVC's (premature ventricular contractions)    status post Holter monitor normal sinus rhythm otherwise asymptomatic PVCs.  . Seasonal allergies     Past Surgical History:  Procedure Laterality Date  . CORONARY ANGIOPLASTY WITH STENT PLACEMENT  2011  . TONSILLECTOMY      Current Medications: Outpatient Medications Prior to Visit  Medication Sig Dispense Refill  . allopurinol (ZYLOPRIM) 300 MG tablet Take 300 mg by mouth daily.    Marland Kitchen ALPRAZolam (XANAX) 0.5 MG tablet Take 0.5 mg by mouth at bedtime.      Marland Kitchen apixaban (ELIQUIS) 5 MG TABS tablet Take 1 tablet (5 mg total) by mouth 2 (two) times daily. 60 tablet 11  . aspirin 81 MG EC tablet Take 1 tablet (81 mg total)  by mouth daily.    Marland Kitchen atorvastatin (LIPITOR) 80 MG tablet Take 1 tablet (80 mg total) by mouth daily. 30 tablet 6  . Cholecalciferol (VITAMIN D-3) 1000 UNITS CAPS Take 1,000 Units by mouth daily.     . fluticasone (FLONASE) 50 MCG/ACT nasal spray Place 2 sprays into the nose daily.      . folic acid (FOLVITE) 1 MG tablet TAKE ONE TABLET BY MOUTH EVERY DAY 30 tablet 6  . lisinopril (PRINIVIL,ZESTRIL) 40 MG tablet TAKE 1 TABLET BY MOUTH EVERY DAY 90 tablet 1  . loratadine (CLARITIN) 10 MG tablet Take 10 mg by mouth daily.      . magnesium oxide (MAG-OX) 400 MG  tablet Take 400 mg by mouth at bedtime.     . meclizine (ANTIVERT) 32 MG tablet Take 32 mg by mouth daily as needed for dizziness.     . Multiple Vitamin (MULTIVITAMIN) tablet Take 1 tablet by mouth 3 (three) times daily.     . nitroGLYCERIN (NITROSTAT) 0.4 MG SL tablet Place 1 tablet (0.4 mg total) under the tongue every 5 (five) minutes as needed. 25 tablet 3  . Omega-3 Fatty Acids (FISH OIL) 1200 MG CAPS Take 1,200 mg by mouth 2 (two) times daily.     . polyethylene glycol (MIRALAX / GLYCOLAX) packet Take 17 g by mouth daily as needed for mild constipation.     . triamterene-hydrochlorothiazide (MAXZIDE-25) 37.5-25 MG per tablet Take 1 tablet by mouth daily.       No facility-administered medications prior to visit.      Allergies:   Penicillins   Social History   Socioeconomic History  . Marital status: Married    Spouse name: Not on file  . Number of children: Not on file  . Years of education: Not on file  . Highest education level: Not on file  Occupational History    Employer: UNEMPLOYED    Comment: works Social worker employed  Social Needs  . Financial resource strain: Not on file  . Food insecurity:    Worry: Not on file    Inability: Not on file  . Transportation needs:    Medical: Not on file    Non-medical: Not on file  Tobacco Use  . Smoking status: Former Smoker    Packs/day: 1.00    Years: 35.00    Pack years: 35.00    Types: Cigarettes    Start date: 09/18/1967    Last attempt to quit: 06/09/1999    Years since quitting: 18.4  . Smokeless tobacco: Never Used  Substance and Sexual Activity  . Alcohol use: Yes    Alcohol/week: 0.0 oz    Comment: drinks 6-8 ounces of liquor per day  . Drug use: Yes    Types: Marijuana    Comment: smokes marijuana occassionally  . Sexual activity: Not on file  Lifestyle  . Physical activity:    Days per week: Not on file    Minutes per session: Not on file  . Stress: Not on file  Relationships  . Social  connections:    Talks on phone: Not on file    Gets together: Not on file    Attends religious service: Not on file    Active member of club or organization: Not on file    Attends meetings of clubs or organizations: Not on file    Relationship status: Not on file  Other Topics Concern  . Not on file  Social History Narrative   No  exercise     Family History:  The patient's family history includes Heart attack in his brother and father; Other in his mother; Stroke in his mother.   Review of Systems:   Please see the history of present illness.     General:  No chills, fever, night sweats or weight changes. Positive for dizziness.  Cardiovascular:  No chest pain, dyspnea on exertion, edema, orthopnea, palpitations, paroxysmal nocturnal dyspnea. Dermatological: No rash, lesions/masses Respiratory: No cough, dyspnea Urologic: No hematuria, dysuria Abdominal:   No nausea, vomiting, diarrhea, bright red blood per rectum, melena, or hematemesis Neurologic:  No visual changes, wkns, changes in mental status. All other systems reviewed and are otherwise negative except as noted above.   Physical Exam:    VS:  BP (!) 142/88   Pulse 92   Ht 5\' 8"  (1.727 m)   Wt 274 lb (124.3 kg)   SpO2 94%   BMI 41.66 kg/m    General: Well developed, well nourished Caucasian male appearing in no acute distress. Head: Normocephalic, atraumatic, sclera non-icteric, no xanthomas, nares are without discharge.  Neck: No carotid bruits. JVD not elevated.  Lungs: Respirations regular and unlabored, without wheezes or rales.  Heart: Irregularly irregular. No S3 or S4.  No murmur, no rubs, or gallops appreciated. Abdomen: Soft, non-tender, non-distended with normoactive bowel sounds. No hepatomegaly. No rebound/guarding. No obvious abdominal masses. Msk:  Strength and tone appear normal for age. No joint deformities or effusions. Extremities: No clubbing or cyanosis. No edema.  Distal pedal pulses are 2+  bilaterally. Neuro: Alert and oriented X 3. Moves all extremities spontaneously. No focal deficits noted. Psych:  Responds to questions appropriately with a normal affect. Skin: No rashes or lesions noted  Wt Readings from Last 3 Encounters:  11/16/17 274 lb (124.3 kg)  10/28/17 265 lb 8 oz (120.4 kg)  05/24/17 271 lb 12.8 oz (123.3 kg)     Studies/Labs Reviewed:   EKG:  EKG is not ordered today.  Recent Labs: No results found for requested labs within last 8760 hours.   Lipid Panel    Component Value Date/Time   CHOL  05/21/2010 0530    168        ATP III CLASSIFICATION:  <200     mg/dL   Desirable  200-239  mg/dL   Borderline High  >=240    mg/dL   High          TRIG 291 (H) 05/21/2010 0530   HDL 38 (L) 05/21/2010 0530   CHOLHDL 4.4 05/21/2010 0530   VLDL 58 (H) 05/21/2010 0530   LDLCALC  05/21/2010 0530    72        Total Cholesterol/HDL:CHD Risk Coronary Heart Disease Risk Table                     Men   Women  1/2 Average Risk   3.4   3.3  Average Risk       5.0   4.4  2 X Average Risk   9.6   7.1  3 X Average Risk  23.4   11.0        Use the calculated Patient Ratio above and the CHD Risk Table to determine the patient's CHD Risk.        ATP III CLASSIFICATION (LDL):  <100     mg/dL   Optimal  100-129  mg/dL   Near or Above  Optimal  130-159  mg/dL   Borderline  160-189  mg/dL   High  >190     mg/dL   Very High    Additional studies/ records that were reviewed today include:   Echocardiogram: 11/02/2017 Study Conclusions  - Left ventricle: The cavity size was mildly dilated. Wall   thickness was increased in a pattern of mild LVH. Systolic   function was normal. The estimated ejection fraction was in the   range of 55% to 60%. Wall motion was normal; there were no   regional wall motion abnormalities. Indeterminate diastolic   function. - Aortic valve: Mildly calcified annulus. Trileaflet; mildly   calcified leaflets. There  was mild stenosis. Mean gradient (S):   12 mm Hg. Peak gradient (S): 27 mm Hg. VTI ratio of LVOT to   aortic valve: 0.43. Valve area (VTI): 1.79 cm^2. Valve area   (Vmax): 1.65 cm^2. Valve area (Vmean): 1.76 cm^2. - Aortic root: The aortic root was mildly ectatic. - Left atrium: The atrium was at the upper limits of normal in   size. - Right atrium: Central venous pressure (est): 3 mm Hg. - Atrial septum: No defect or patent foramen ovale was identified. - Tricuspid valve: There was trivial regurgitation. - Pulmonary arteries: Systolic pressure could not be accurately   estimated. - Pericardium, extracardiac: There was no pericardial effusion.  Assessment:    1. Persistent atrial fibrillation ([place])   2. Current use of long term anticoagulation   3. Essential hypertension   4. Hyperlipidemia, unspecified hyperlipidemia type      Plan:   In order of problems listed above:  1. Persistent Atrial Fibrillation - the patient was diagnosed with atrial fibrillation in 10/2017 and was recently started on anticoagulation at the time of his office visit on 5/23. He has not required AV nodal blocking agents as HR has overall been well-controlled. He has continued to experience dizziness and worsening fatigue since being diagnosed and feels like the arrhythmia is a significant contributor to his current symptoms. Clearly remains in atrial fibrillation by examination today. - reviewed with Dr. Bronson Ing and will plan for a DCCV later this week following 3 weeks of anticoagulation. He confirms he has not missed any doses of Eliquis since initiation. Risks and benefits of the procedure were reviewed and he agrees to proceed.  2. Use of Long-Term Anticoagulation - he does report some episodes of epistaxis but says these have spontaneously resolved. Has known hemorrhoids and has noticed occasional streaks of bright red blood on the toilet paper but denies any significant melena or hematochezia.  -  will check a CBC prior to his scheduled DCCV. He is currently on ASA 81mg  daily and Eliquis 5mg  BID. No indication specifically for ASA at this time, therefore will discontinue ASA and continue Eliquis.   3. HTN - BP slightly elevated at 142/88 during today's visit.  - continue current medication regimen at this time including Lisinopril and Triamterene-HCTZ.  4. HLD - followed by PCP. Remains on Atorvastatin 80mg  daily.    Medication Adjustments/Labs and Tests Ordered: Current medicines are reviewed at length with the patient today.  Concerns regarding medicines are outlined above.  Medication changes, Labs and Tests ordered today are listed in the Patient Instructions below. Patient Instructions  Medication Instructions:  Your physician has recommended you make the following change in your medication:  STOP Taking Aspirin    Labwork: Your physician recommends that you return for lab work in: Today    Testing/Procedures:  Your physician has recommended that you have a Cardioversion (DCCV). Electrical Cardioversion uses a jolt of electricity to your heart either through paddles or wired patches attached to your chest. This is a controlled, usually prescheduled, procedure. Defibrillation is done under light anesthesia in the hospital, and you usually go home the day of the procedure. This is done to get your heart back into a normal rhythm. You are not awake for the procedure. Please see the instruction sheet given to you today.  Follow-Up: Your physician recommends that you schedule a follow-up appointment in: 3 Weeks with Bernerd Pho, PA-C   Any Other Special Instructions Will Be Listed Below (If Applicable).  If you need a refill on your cardiac medications before your next appointment, please call your pharmacy. Thank you for choosing [facility]!       Signed, Erma Heritage, PA-C  11/16/2017 7:14 PM    [facility] [address] [place], [place] [address] Phone: [phone]

## 2017-11-15 NOTE — H&P (View-Only) (Signed)
Cardiology Office Note    Date:  11/16/2017   ID:  Martin [name], DOB Oct 19, 1955, MRN [id number]  PCP:  Deloria Lair., MD  Cardiologist: Kate Sable, MD    Chief Complaint  Patient presents with  . Follow-up    3 week visit    History of Present Illness:    Martin [name] is a 62 y.o. male with past medical history of CAD (s/p stenting of LAD in 2001, DES to RCA and mid-LAD in 2011), carotid artery stenosis, HTN, HLD, and tobacco use who presents to the office today for 3-week follow-up.  He was last examined by Melina Copa, PA-C on 10/28/2017 following a recent hospitalization for atrial fibrillation with RVR. Labs showed no acute findings and he was not started on anticoagulation at the time of discharge.During his office visit, he reported overall feeling well and denied any recent chest pain or dyspnea. Did report some episodes of dizziness but this was noted to have occurred previously when he was in NSR. EKG showed he was in rate-controlled atrial fibrillation. He was started on Eliquis 5mg  BID for anticoagulation with consideration of a DCCV if still in atrial fibrillation following 3 weeks of anticoagulation. An echocardiogram was obtained in the interim and showed a preserved EF of 55-60%, no regional WMA, mild AS, and trivial TR.  In talking with the patient today, he reports still having episodes of dizziness and overall fatigue since being diagnosed with atrial fibrillation last month. Denies any specific palpitations, chest discomfort, or dyspnea. No recent orthopnea, PND, or lower extremity edema.  He reports good compliance with Eliquis and has experienced some episodes of epistaxis but says these have spontaneously resolved. He has a known history of hemorrhoids and has noticed occasional streaks of bright red blood on the toilet paper but denies any significant melena or hematochezia.    Past Medical History:  Diagnosis Date  . Atrial fibrillation ([place])      a. dx 10/2017.  Marland Kitchen CAD (coronary artery disease)    a. s/p PCI to LAD 2001. b. s/p stenting to the RCA and mid LAD December 2011, residual distal RCA disease treated medically. b. nuc 02/2013 abnormal with fixed defect seen in inferoapical, mid-inferior, and basal inferior walls, indicative of myocardial scar, no evidence of ischemia, EF 41% (Ef 55-60% by echo same time).  . Carotid artery disease ([place])    carotid Doppler course left side 5069% stenosis  . Habitual alcohol use   . Hyperlipidemia   . Hypertension   . Low back pain   . OSA on CPAP   . Osteoarthritis   . Polysubstance abuse ([place])    use including marijuana and vicodin,which he obtained from the street  . PVC's (premature ventricular contractions)    status post Holter monitor normal sinus rhythm otherwise asymptomatic PVCs.  . Seasonal allergies     Past Surgical History:  Procedure Laterality Date  . CORONARY ANGIOPLASTY WITH STENT PLACEMENT  2011  . TONSILLECTOMY      Current Medications: Outpatient Medications Prior to Visit  Medication Sig Dispense Refill  . allopurinol (ZYLOPRIM) 300 MG tablet Take 300 mg by mouth daily.    Marland Kitchen ALPRAZolam (XANAX) 0.5 MG tablet Take 0.5 mg by mouth at bedtime.      Marland Kitchen apixaban (ELIQUIS) 5 MG TABS tablet Take 1 tablet (5 mg total) by mouth 2 (two) times daily. 60 tablet 11  . aspirin 81 MG EC tablet Take 1 tablet (81 mg total)  by mouth daily.    Marland Kitchen atorvastatin (LIPITOR) 80 MG tablet Take 1 tablet (80 mg total) by mouth daily. 30 tablet 6  . Cholecalciferol (VITAMIN D-3) 1000 UNITS CAPS Take 1,000 Units by mouth daily.     . fluticasone (FLONASE) 50 MCG/ACT nasal spray Place 2 sprays into the nose daily.      . folic acid (FOLVITE) 1 MG tablet TAKE ONE TABLET BY MOUTH EVERY DAY 30 tablet 6  . lisinopril (PRINIVIL,ZESTRIL) 40 MG tablet TAKE 1 TABLET BY MOUTH EVERY DAY 90 tablet 1  . loratadine (CLARITIN) 10 MG tablet Take 10 mg by mouth daily.      . magnesium oxide (MAG-OX) 400 MG  tablet Take 400 mg by mouth at bedtime.     . meclizine (ANTIVERT) 32 MG tablet Take 32 mg by mouth daily as needed for dizziness.     . Multiple Vitamin (MULTIVITAMIN) tablet Take 1 tablet by mouth 3 (three) times daily.     . nitroGLYCERIN (NITROSTAT) 0.4 MG SL tablet Place 1 tablet (0.4 mg total) under the tongue every 5 (five) minutes as needed. 25 tablet 3  . Omega-3 Fatty Acids (FISH OIL) 1200 MG CAPS Take 1,200 mg by mouth 2 (two) times daily.     . polyethylene glycol (MIRALAX / GLYCOLAX) packet Take 17 g by mouth daily as needed for mild constipation.     . triamterene-hydrochlorothiazide (MAXZIDE-25) 37.5-25 MG per tablet Take 1 tablet by mouth daily.       No facility-administered medications prior to visit.      Allergies:   Penicillins   Social History   Socioeconomic History  . Marital status: Married    Spouse name: Not on file  . Number of children: Not on file  . Years of education: Not on file  . Highest education level: Not on file  Occupational History    Employer: UNEMPLOYED    Comment: works Social worker employed  Social Needs  . Financial resource strain: Not on file  . Food insecurity:    Worry: Not on file    Inability: Not on file  . Transportation needs:    Medical: Not on file    Non-medical: Not on file  Tobacco Use  . Smoking status: Former Smoker    Packs/day: 1.00    Years: 35.00    Pack years: 35.00    Types: Cigarettes    Start date: 09/18/1967    Last attempt to quit: 06/09/1999    Years since quitting: 18.4  . Smokeless tobacco: Never Used  Substance and Sexual Activity  . Alcohol use: Yes    Alcohol/week: 0.0 oz    Comment: drinks 6-8 ounces of liquor per day  . Drug use: Yes    Types: Marijuana    Comment: smokes marijuana occassionally  . Sexual activity: Not on file  Lifestyle  . Physical activity:    Days per week: Not on file    Minutes per session: Not on file  . Stress: Not on file  Relationships  . Social  connections:    Talks on phone: Not on file    Gets together: Not on file    Attends religious service: Not on file    Active member of club or organization: Not on file    Attends meetings of clubs or organizations: Not on file    Relationship status: Not on file  Other Topics Concern  . Not on file  Social History Narrative   No  exercise     Family History:  The patient's family history includes Heart attack in his brother and father; Other in his mother; Stroke in his mother.   Review of Systems:   Please see the history of present illness.     General:  No chills, fever, night sweats or weight changes. Positive for dizziness.  Cardiovascular:  No chest pain, dyspnea on exertion, edema, orthopnea, palpitations, paroxysmal nocturnal dyspnea. Dermatological: No rash, lesions/masses Respiratory: No cough, dyspnea Urologic: No hematuria, dysuria Abdominal:   No nausea, vomiting, diarrhea, bright red blood per rectum, melena, or hematemesis Neurologic:  No visual changes, wkns, changes in mental status. All other systems reviewed and are otherwise negative except as noted above.   Physical Exam:    VS:  BP (!) 142/88   Pulse 92   Ht 5\' 8"  (1.727 m)   Wt 274 lb (124.3 kg)   SpO2 94%   BMI 41.66 kg/m    General: Well developed, well nourished Caucasian male appearing in no acute distress. Head: Normocephalic, atraumatic, sclera non-icteric, no xanthomas, nares are without discharge.  Neck: No carotid bruits. JVD not elevated.  Lungs: Respirations regular and unlabored, without wheezes or rales.  Heart: Irregularly irregular. No S3 or S4.  No murmur, no rubs, or gallops appreciated. Abdomen: Soft, non-tender, non-distended with normoactive bowel sounds. No hepatomegaly. No rebound/guarding. No obvious abdominal masses. Msk:  Strength and tone appear normal for age. No joint deformities or effusions. Extremities: No clubbing or cyanosis. No edema.  Distal pedal pulses are 2+  bilaterally. Neuro: Alert and oriented X 3. Moves all extremities spontaneously. No focal deficits noted. Psych:  Responds to questions appropriately with a normal affect. Skin: No rashes or lesions noted  Wt Readings from Last 3 Encounters:  11/16/17 274 lb (124.3 kg)  10/28/17 265 lb 8 oz (120.4 kg)  05/24/17 271 lb 12.8 oz (123.3 kg)     Studies/Labs Reviewed:   EKG:  EKG is not ordered today.  Recent Labs: No results found for requested labs within last 8760 hours.   Lipid Panel    Component Value Date/Time   CHOL  05/21/2010 0530    168        ATP III CLASSIFICATION:  <200     mg/dL   Desirable  200-239  mg/dL   Borderline High  >=240    mg/dL   High          TRIG 291 (H) 05/21/2010 0530   HDL 38 (L) 05/21/2010 0530   CHOLHDL 4.4 05/21/2010 0530   VLDL 58 (H) 05/21/2010 0530   LDLCALC  05/21/2010 0530    72        Total Cholesterol/HDL:CHD Risk Coronary Heart Disease Risk Table                     Men   Women  1/2 Average Risk   3.4   3.3  Average Risk       5.0   4.4  2 X Average Risk   9.6   7.1  3 X Average Risk  23.4   11.0        Use the calculated Patient Ratio above and the CHD Risk Table to determine the patient's CHD Risk.        ATP III CLASSIFICATION (LDL):  <100     mg/dL   Optimal  100-129  mg/dL   Near or Above  Optimal  130-159  mg/dL   Borderline  160-189  mg/dL   High  >190     mg/dL   Very High    Additional studies/ records that were reviewed today include:   Echocardiogram: 11/02/2017 Study Conclusions  - Left ventricle: The cavity size was mildly dilated. Wall   thickness was increased in a pattern of mild LVH. Systolic   function was normal. The estimated ejection fraction was in the   range of 55% to 60%. Wall motion was normal; there were no   regional wall motion abnormalities. Indeterminate diastolic   function. - Aortic valve: Mildly calcified annulus. Trileaflet; mildly   calcified leaflets. There  was mild stenosis. Mean gradient (S):   12 mm Hg. Peak gradient (S): 27 mm Hg. VTI ratio of LVOT to   aortic valve: 0.43. Valve area (VTI): 1.79 cm^2. Valve area   (Vmax): 1.65 cm^2. Valve area (Vmean): 1.76 cm^2. - Aortic root: The aortic root was mildly ectatic. - Left atrium: The atrium was at the upper limits of normal in   size. - Right atrium: Central venous pressure (est): 3 mm Hg. - Atrial septum: No defect or patent foramen ovale was identified. - Tricuspid valve: There was trivial regurgitation. - Pulmonary arteries: Systolic pressure could not be accurately   estimated. - Pericardium, extracardiac: There was no pericardial effusion.  Assessment:    1. Persistent atrial fibrillation ([place])   2. Current use of long term anticoagulation   3. Essential hypertension   4. Hyperlipidemia, unspecified hyperlipidemia type      Plan:   In order of problems listed above:  1. Persistent Atrial Fibrillation - the patient was diagnosed with atrial fibrillation in 10/2017 and was recently started on anticoagulation at the time of his office visit on 5/23. He has not required AV nodal blocking agents as HR has overall been well-controlled. He has continued to experience dizziness and worsening fatigue since being diagnosed and feels like the arrhythmia is a significant contributor to his current symptoms. Clearly remains in atrial fibrillation by examination today. - reviewed with Dr. Bronson Ing and will plan for a DCCV later this week following 3 weeks of anticoagulation. He confirms he has not missed any doses of Eliquis since initiation. Risks and benefits of the procedure were reviewed and he agrees to proceed.  2. Use of Long-Term Anticoagulation - he does report some episodes of epistaxis but says these have spontaneously resolved. Has known hemorrhoids and has noticed occasional streaks of bright red blood on the toilet paper but denies any significant melena or hematochezia.  -  will check a CBC prior to his scheduled DCCV. He is currently on ASA 81mg  daily and Eliquis 5mg  BID. No indication specifically for ASA at this time, therefore will discontinue ASA and continue Eliquis.   3. HTN - BP slightly elevated at 142/88 during today's visit.  - continue current medication regimen at this time including Lisinopril and Triamterene-HCTZ.  4. HLD - followed by PCP. Remains on Atorvastatin 80mg  daily.    Medication Adjustments/Labs and Tests Ordered: Current medicines are reviewed at length with the patient today.  Concerns regarding medicines are outlined above.  Medication changes, Labs and Tests ordered today are listed in the Patient Instructions below. Patient Instructions  Medication Instructions:  Your physician has recommended you make the following change in your medication:  STOP Taking Aspirin    Labwork: Your physician recommends that you return for lab work in: Today    Testing/Procedures:  Your physician has recommended that you have a Cardioversion (DCCV). Electrical Cardioversion uses a jolt of electricity to your heart either through paddles or wired patches attached to your chest. This is a controlled, usually prescheduled, procedure. Defibrillation is done under light anesthesia in the hospital, and you usually go home the day of the procedure. This is done to get your heart back into a normal rhythm. You are not awake for the procedure. Please see the instruction sheet given to you today.  Follow-Up: Your physician recommends that you schedule a follow-up appointment in: 3 Weeks with Bernerd Pho, PA-C   Any Other Special Instructions Will Be Listed Below (If Applicable).  If you need a refill on your cardiac medications before your next appointment, please call your pharmacy. Thank you for choosing [facility]!       Signed, Erma Heritage, PA-C  11/16/2017 7:14 PM    [facility] [address] [place], [place] [address] Phone: [phone]

## 2017-11-16 ENCOUNTER — Encounter: Payer: Self-pay | Admitting: Student

## 2017-11-16 ENCOUNTER — Encounter: Payer: Self-pay | Admitting: *Deleted

## 2017-11-16 ENCOUNTER — Ambulatory Visit (INDEPENDENT_AMBULATORY_CARE_PROVIDER_SITE_OTHER): Payer: BLUE CROSS/BLUE SHIELD | Admitting: Student

## 2017-11-16 VITALS — BP 142/88 | HR 92 | Ht 68.0 in | Wt 274.0 lb

## 2017-11-16 DIAGNOSIS — Z7901 Long term (current) use of anticoagulants: Secondary | ICD-10-CM | POA: Diagnosis not present

## 2017-11-16 DIAGNOSIS — E785 Hyperlipidemia, unspecified: Secondary | ICD-10-CM | POA: Diagnosis not present

## 2017-11-16 DIAGNOSIS — I4819 Other persistent atrial fibrillation: Secondary | ICD-10-CM

## 2017-11-16 DIAGNOSIS — I481 Persistent atrial fibrillation: Secondary | ICD-10-CM | POA: Diagnosis not present

## 2017-11-16 DIAGNOSIS — I10 Essential (primary) hypertension: Secondary | ICD-10-CM

## 2017-11-16 NOTE — Patient Instructions (Signed)
Medication Instructions:  Your physician has recommended you make the following change in your medication:  STOP Taking Aspirin    Labwork: Your physician recommends that you return for lab work in: Today    Testing/Procedures: Your physician has recommended that you have a Cardioversion (DCCV). Electrical Cardioversion uses a jolt of electricity to your heart either through paddles or wired patches attached to your chest. This is a controlled, usually prescheduled, procedure. Defibrillation is done under light anesthesia in the hospital, and you usually go home the day of the procedure. This is done to get your heart back into a normal rhythm. You are not awake for the procedure. Please see the instruction sheet given to you today.    Follow-Up: Your physician recommends that you schedule a follow-up appointment in: 3 Weeks with Bernerd Pho, PA-C   Any Other Special Instructions Will Be Listed Below (If Applicable).     If you need a refill on your cardiac medications before your next appointment, please call your pharmacy. Thank you for choosing [facility]!

## 2017-11-16 NOTE — Patient Instructions (Signed)
Martin [name]  11/16/2017     @PREFPERIOPPHARMACY @   Your procedure is scheduled on  11/19/2017   Report to [facility] at 67   A.M.  Call this number if you have problems the morning of surgery:  [phone]   Remember:  Do not eat or drink after midnight.  You may drink clear liquids until  12 midnight 11/18/2017  .  Clear liquids allowed are:                    Water, Juice (non-citric and without pulp), Carbonated beverages, Clear Tea, Black Coffee only, Plain Jell-O only, Gatorade and Plain Popsicles only    Take these medicines the morning of surgery with A SIP OF WATER  Allopurinol, lisinopril, claritin, antivert, maxzide, eliquis.    Do not wear jewelry, make-up or nail polish.  Do not wear lotions, powders, or perfumes, or deodorant.  Do not shave 48 hours prior to surgery.  Men may shave face and neck.  Do not bring valuables to the hospital.  [facility] is not responsible for any belongings or valuables.  Contacts, dentures or bridgework may not be worn into surgery.  Leave your suitcase in the car.  After surgery it may be brought to your room.  For patients admitted to the hospital, discharge time will be determined by your treatment team.  Patients discharged the day of surgery will not be allowed to drive home.   Name and phone number of your driver:   family Special instructions:  DO NOT miss any doses of your eliquis before your procedure.  Please read over the following fact sheets that you were given. Anesthesia Post-op Instructions and Care and Recovery After Surgery       Electrical Cardioversion Electrical cardioversion is the delivery of a jolt of electricity to restore a normal rhythm to the heart. A rhythm that is too fast or is not regular keeps the heart from pumping well. In this procedure, sticky patches or metal paddles are placed on the chest to deliver electricity to the heart from a device. This procedure may be done in  an emergency if:  There is low or no blood pressure as a result of the heart rhythm.  Normal rhythm must be restored as fast as possible to protect the brain and heart from further damage.  It may save a life.  This procedure may also be done for irregular or fast heart rhythms that are not immediately life-threatening. Tell a health care provider about:  Any allergies you have.  All medicines you are taking, including vitamins, herbs, eye drops, creams, and over-the-counter medicines.  Any problems you or family members have had with anesthetic medicines.  Any blood disorders you have.  Any surgeries you have had.  Any medical conditions you have.  Whether you are pregnant or may be pregnant. What are the risks? Generally, this is a safe procedure. However, problems may occur, including:  Allergic reactions to medicines.  A blood clot that breaks free and travels to other parts of your body.  The possible return of an abnormal heart rhythm within hours or days after the procedure.  Your heart stopping (cardiac arrest). This is rare.  What happens before the procedure? Medicines  Your health care provider may have you start taking: ? Blood-thinning medicines (anticoagulants) so your blood does not clot as easily. ? Medicines may be given to help stabilize  your heart rate and rhythm.  Ask your health care provider about changing or stopping your regular medicines. This is especially important if you are taking diabetes medicines or blood thinners. General instructions  Plan to have someone take you home from the hospital or clinic.  If you will be going home right after the procedure, plan to have someone with you for 24 hours.  Follow instructions from your health care provider about eating or drinking restrictions. What happens during the procedure?  To lower your risk of infection: ? Your health care team will wash or sanitize their hands. ? Your skin will be  washed with soap.  An IV tube will be inserted into one of your veins.  You will be given a medicine to help you relax (sedative).  Sticky patches (electrodes) or metal paddles may be placed on your chest.  An electrical shock will be delivered. The procedure may vary among health care providers and hospitals. What happens after the procedure?  Your blood pressure, heart rate, breathing rate, and blood oxygen level will be monitored until the medicines you were given have worn off.  Do not drive for 24 hours if you were given a sedative.  Your heart rhythm will be watched to make sure it does not change. This information is not intended to replace advice given to you by your health care provider. Make sure you discuss any questions you have with your health care provider. Document Released: 05/15/2002 Document Revised: 01/22/2016 Document Reviewed: 11/29/2015 Elsevier Interactive Patient Education  2017 [employer].  Electrical Cardioversion, Care After This sheet gives you information about how to care for yourself after your procedure. Your health care provider may also give you more specific instructions. If you have problems or questions, contact your health care provider. What can I expect after the procedure? After the procedure, it is common to have:  Some redness on the skin where the shocks were given.  Follow these instructions at home:  Do not drive for 24 hours if you were given a medicine to help you relax (sedative).  Take over-the-counter and prescription medicines only as told by your health care provider.  Ask your health care provider how to check your pulse. Check it often.  Rest for 48 hours after the procedure or as told by your health care provider.  Avoid or limit your caffeine use as told by your health care provider. Contact a health care provider if:  You feel like your heart is beating too quickly or your pulse is not regular.  You have a  serious muscle cramp that does not go away. Get help right away if:  You have discomfort in your chest.  You are dizzy or you feel faint.  You have trouble breathing or you are short of breath.  Your speech is slurred.  You have trouble moving an arm or leg on one side of your body.  Your fingers or toes turn cold or blue. This information is not intended to replace advice given to you by your health care provider. Make sure you discuss any questions you have with your health care provider. Document Released: 03/15/2013 Document Revised: 12/27/2015 Document Reviewed: 11/29/2015 Elsevier Interactive Patient Education  2018 [place] Anesthesia is a term that refers to techniques, procedures, and medicines that help a person stay safe and comfortable during a medical procedure. Monitored anesthesia care, or sedation, is one type of anesthesia. Your anesthesia specialist may recommend sedation  if you will be having a procedure that does not require you to be unconscious, such as:  Cataract surgery.  A dental procedure.  A biopsy.  A colonoscopy.  During the procedure, you may receive a medicine to help you relax (sedative). There are three levels of sedation:  Mild sedation. At this level, you may feel awake and relaxed. You will be able to follow directions.  Moderate sedation. At this level, you will be sleepy. You may not remember the procedure.  Deep sedation. At this level, you will be asleep. You will not remember the procedure.  The more medicine you are given, the deeper your level of sedation will be. Depending on how you respond to the procedure, the anesthesia specialist may change your level of sedation or the type of anesthesia to fit your needs. An anesthesia specialist will monitor you closely during the procedure. Let your health care provider know about:  Any allergies you have.  All medicines you are taking, including vitamins,  herbs, eye drops, creams, and over-the-counter medicines.  Any use of steroids (by mouth or as a cream).  Any problems you or family members have had with sedatives and anesthetic medicines.  Any blood disorders you have.  Any surgeries you have had.  Any medical conditions you have, such as sleep apnea.  Whether you are pregnant or may be pregnant.  Any use of cigarettes, alcohol, or street drugs. What are the risks? Generally, this is a safe procedure. However, problems may occur, including:  Getting too much medicine (oversedation).  Nausea.  Allergic reaction to medicines.  Trouble breathing. If this happens, a breathing tube may be used to help with breathing. It will be removed when you are awake and breathing on your own.  Heart trouble.  Lung trouble.  Before the procedure Staying hydrated Follow instructions from your health care provider about hydration, which may include:  Up to 2 hours before the procedure - you may continue to drink clear liquids, such as water, clear fruit juice, black coffee, and plain tea.  Eating and drinking restrictions Follow instructions from your health care provider about eating and drinking, which may include:  8 hours before the procedure - stop eating heavy meals or foods such as meat, fried foods, or fatty foods.  6 hours before the procedure - stop eating light meals or foods, such as toast or cereal.  6 hours before the procedure - stop drinking milk or drinks that contain milk.  2 hours before the procedure - stop drinking clear liquids.  Medicines Ask your health care provider about:  Changing or stopping your regular medicines. This is especially important if you are taking diabetes medicines or blood thinners.  Taking medicines such as aspirin and ibuprofen. These medicines can thin your blood. Do not take these medicines before your procedure if your health care provider instructs you not to.  Tests and  exams  You will have a physical exam.  You may have blood tests done to show: ? How well your kidneys and liver are working. ? How well your blood can clot.  General instructions  Plan to have someone take you home from the hospital or clinic.  If you will be going home right after the procedure, plan to have someone with you for 24 hours.  What happens during the procedure?  Your blood pressure, heart rate, breathing, level of pain and overall condition will be monitored.  An IV tube will be inserted into one  of your veins.  Your anesthesia specialist will give you medicines as needed to keep you comfortable during the procedure. This may mean changing the level of sedation.  The procedure will be performed. After the procedure  Your blood pressure, heart rate, breathing rate, and blood oxygen level will be monitored until the medicines you were given have worn off.  Do not drive for 24 hours if you received a sedative.  You may: ? Feel sleepy, clumsy, or nauseous. ? Feel forgetful about what happened after the procedure. ? Have a sore throat if you had a breathing tube during the procedure. ? Vomit. This information is not intended to replace advice given to you by your health care provider. Make sure you discuss any questions you have with your health care provider. Document Released: 02/18/2005 Document Revised: 11/01/2015 Document Reviewed: 09/15/2015 Elsevier Interactive Patient Education  2018 [place], Care After These instructions provide you with information about caring for yourself after your procedure. Your health care provider may also give you more specific instructions. Your treatment has been planned according to current medical practices, but problems sometimes occur. Call your health care provider if you have any problems or questions after your procedure. What can I expect after the procedure? After your procedure, it is  common to:  Feel sleepy for several hours.  Feel clumsy and have poor balance for several hours.  Feel forgetful about what happened after the procedure.  Have poor judgment for several hours.  Feel nauseous or vomit.  Have a sore throat if you had a breathing tube during the procedure.  Follow these instructions at home: For at least 24 hours after the procedure:   Do not: ? Participate in activities in which you could fall or become injured. ? Drive. ? Use heavy machinery. ? Drink alcohol. ? Take sleeping pills or medicines that cause drowsiness. ? Make important decisions or sign legal documents. ? Take care of children on your own.  Rest. Eating and drinking  Follow the diet that is recommended by your health care provider.  If you vomit, drink water, juice, or soup when you can drink without vomiting.  Make sure you have little or no nausea before eating solid foods. General instructions  Have a responsible adult stay with you until you are awake and alert.  Take over-the-counter and prescription medicines only as told by your health care provider.  If you smoke, do not smoke without supervision.  Keep all follow-up visits as told by your health care provider. This is important. Contact a health care provider if:  You keep feeling nauseous or you keep vomiting.  You feel light-headed.  You develop a rash.  You have a fever. Get help right away if:  You have trouble breathing. This information is not intended to replace advice given to you by your health care provider. Make sure you discuss any questions you have with your health care provider. Document Released: 09/15/2015 Document Revised: 01/15/2016 Document Reviewed: 09/15/2015 Elsevier Interactive Patient Education  [employer].

## 2017-11-17 ENCOUNTER — Other Ambulatory Visit: Payer: Self-pay

## 2017-11-17 ENCOUNTER — Encounter (HOSPITAL_COMMUNITY)
Admission: RE | Admit: 2017-11-17 | Discharge: 2017-11-17 | Disposition: A | Discharge status: Home / Self Care | Payer: BLUE CROSS/BLUE SHIELD | Source: Ambulatory Visit | Attending: Cardiovascular Disease | Admitting: Cardiovascular Disease

## 2017-11-17 ENCOUNTER — Encounter (HOSPITAL_COMMUNITY): Payer: Self-pay

## 2017-11-17 DIAGNOSIS — Z01812 Encounter for preprocedural laboratory examination: Secondary | ICD-10-CM | POA: Diagnosis not present

## 2017-11-17 HISTORY — DX: Cardiac arrhythmia, unspecified: I49.9

## 2017-11-17 LAB — CBC WITH DIFFERENTIAL/PLATELET
Basophils Absolute: 0 10*3/uL (ref 0.0–0.1)
Basophils Relative: 1 %
Eosinophils Absolute: 0.2 10*3/uL (ref 0.0–0.7)
Eosinophils Relative: 4 %
HEMATOCRIT: 41.3 % (ref 39.0–52.0)
Hemoglobin: 13.5 g/dL (ref 13.0–17.0)
LYMPHS PCT: 26 %
Lymphs Abs: 1.3 10*3/uL (ref 0.7–4.0)
MCH: 32.3 pg (ref 26.0–34.0)
MCHC: 32.7 g/dL (ref 30.0–36.0)
MCV: 98.8 fL (ref 78.0–100.0)
MONO ABS: 0.6 10*3/uL (ref 0.1–1.0)
MONOS PCT: 12 %
NEUTROS ABS: 2.9 10*3/uL (ref 1.7–7.7)
Neutrophils Relative %: 57 %
Platelets: 133 10*3/uL — ABNORMAL LOW (ref 150–400)
RBC: 4.18 MIL/uL — ABNORMAL LOW (ref 4.22–5.81)
RDW: 14.1 % (ref 11.5–15.5)
WBC: 5.1 10*3/uL (ref 4.0–10.5)

## 2017-11-17 LAB — BASIC METABOLIC PANEL
Anion gap: 8 (ref 5–15)
BUN: 21 mg/dL — ABNORMAL HIGH (ref 6–20)
CALCIUM: 9.2 mg/dL (ref 8.9–10.3)
CO2: 25 mmol/L (ref 22–32)
CREATININE: 0.84 mg/dL (ref 0.61–1.24)
Chloride: 106 mmol/L (ref 101–111)
GFR calc Af Amer: >60 mL/min (ref >60)
GFR calc non Af Amer: >60 mL/min (ref >60)
GLUCOSE: 121 mg/dL — AB (ref 65–99)
Potassium: 4.2 mmol/L (ref 3.5–5.1)
Sodium: 139 mmol/L (ref 135–145)

## 2017-11-19 ENCOUNTER — Ambulatory Visit (HOSPITAL_COMMUNITY)
Admission: RE | Admit: 2017-11-19 | Discharge: 2017-11-19 | Disposition: A | Discharge status: Home / Self Care | Payer: BLUE CROSS/BLUE SHIELD | Source: Ambulatory Visit | Attending: Cardiovascular Disease | Admitting: Cardiovascular Disease

## 2017-11-19 ENCOUNTER — Ambulatory Visit (HOSPITAL_COMMUNITY): Payer: BLUE CROSS/BLUE SHIELD | Admitting: Anesthesiology

## 2017-11-19 ENCOUNTER — Encounter (HOSPITAL_COMMUNITY)
Admission: RE | Disposition: A | Discharge status: Home / Self Care | Payer: Self-pay | Source: Ambulatory Visit | Attending: Cardiovascular Disease

## 2017-11-19 ENCOUNTER — Encounter (HOSPITAL_COMMUNITY): Payer: Self-pay | Admitting: *Deleted

## 2017-11-19 DIAGNOSIS — Z87891 Personal history of nicotine dependence: Secondary | ICD-10-CM | POA: Diagnosis not present

## 2017-11-19 DIAGNOSIS — Z7982 Long term (current) use of aspirin: Secondary | ICD-10-CM | POA: Insufficient documentation

## 2017-11-19 DIAGNOSIS — Z880 Allergy status to penicillin: Secondary | ICD-10-CM | POA: Diagnosis not present

## 2017-11-19 DIAGNOSIS — Z955 Presence of coronary angioplasty implant and graft: Secondary | ICD-10-CM | POA: Insufficient documentation

## 2017-11-19 DIAGNOSIS — G4733 Obstructive sleep apnea (adult) (pediatric): Secondary | ICD-10-CM | POA: Insufficient documentation

## 2017-11-19 DIAGNOSIS — K649 Unspecified hemorrhoids: Secondary | ICD-10-CM | POA: Insufficient documentation

## 2017-11-19 DIAGNOSIS — I6529 Occlusion and stenosis of unspecified carotid artery: Secondary | ICD-10-CM | POA: Diagnosis not present

## 2017-11-19 DIAGNOSIS — I481 Persistent atrial fibrillation: Secondary | ICD-10-CM | POA: Diagnosis present

## 2017-11-19 DIAGNOSIS — Z7901 Long term (current) use of anticoagulants: Secondary | ICD-10-CM | POA: Insufficient documentation

## 2017-11-19 DIAGNOSIS — Z79899 Other long term (current) drug therapy: Secondary | ICD-10-CM | POA: Insufficient documentation

## 2017-11-19 DIAGNOSIS — Z9989 Dependence on other enabling machines and devices: Secondary | ICD-10-CM | POA: Insufficient documentation

## 2017-11-19 DIAGNOSIS — M1990 Unspecified osteoarthritis, unspecified site: Secondary | ICD-10-CM | POA: Insufficient documentation

## 2017-11-19 DIAGNOSIS — E785 Hyperlipidemia, unspecified: Secondary | ICD-10-CM | POA: Diagnosis not present

## 2017-11-19 DIAGNOSIS — I10 Essential (primary) hypertension: Secondary | ICD-10-CM | POA: Insufficient documentation

## 2017-11-19 DIAGNOSIS — I2510 Atherosclerotic heart disease of native coronary artery without angina pectoris: Secondary | ICD-10-CM | POA: Insufficient documentation

## 2017-11-19 DIAGNOSIS — I493 Ventricular premature depolarization: Secondary | ICD-10-CM | POA: Insufficient documentation

## 2017-11-19 HISTORY — PX: CARDIOVERSION: SHX1299

## 2017-11-19 SURGERY — CARDIOVERSION
Anesthesia: Monitor Anesthesia Care

## 2017-11-19 MED ORDER — LACTATED RINGERS IV SOLN
INTRAVENOUS | Status: DC
  Administered 2017-11-19: 12:00:00 via INTRAVENOUS

## 2017-11-19 MED ORDER — HYDROMORPHONE HCL 1 MG/ML IJ SOLN: 0.2500 mg | INTRAMUSCULAR | Status: DC | PRN

## 2017-11-19 MED ORDER — FENTANYL CITRATE (PF) 100 MCG/2ML IJ SOLN
INTRAMUSCULAR | Status: AC
  Filled 2017-11-19: qty 2

## 2017-11-19 MED ORDER — MIDAZOLAM HCL 2 MG/2ML IJ SOLN
INTRAMUSCULAR | Status: AC
  Filled 2017-11-19: qty 2

## 2017-11-19 MED ORDER — MIDAZOLAM HCL 5 MG/5ML IJ SOLN
INTRAMUSCULAR | Status: DC | PRN
  Administered 2017-11-19: 2 mg via INTRAVENOUS

## 2017-11-19 MED ORDER — LACTATED RINGERS IV SOLN: INTRAVENOUS | Status: DC

## 2017-11-19 MED ORDER — HYDROCODONE-ACETAMINOPHEN 7.5-325 MG PO TABS: 1.0000 | ORAL_TABLET | Freq: Once | ORAL | Status: DC | PRN

## 2017-11-19 MED ORDER — MEPERIDINE HCL 100 MG/ML IJ SOLN: 6.2500 mg | INTRAMUSCULAR | Status: DC | PRN

## 2017-11-19 MED ORDER — FENTANYL CITRATE (PF) 100 MCG/2ML IJ SOLN
INTRAMUSCULAR | Status: DC | PRN
  Administered 2017-11-19: 50 ug via INTRAVENOUS

## 2017-11-19 MED ORDER — PROPOFOL 500 MG/50ML IV EMUL
INTRAVENOUS | Status: DC | PRN
  Administered 2017-11-19: 150 ug/kg/min via INTRAVENOUS

## 2017-11-19 MED ORDER — PROMETHAZINE HCL 25 MG/ML IJ SOLN: 6.2500 mg | INTRAMUSCULAR | Status: DC | PRN

## 2017-11-19 NOTE — Anesthesia Preprocedure Evaluation (Signed)
Anesthesia Evaluation  Patient identified by MRN, date of birth, ID band Patient awake    Reviewed: Allergy & Precautions, H&P , NPO status , Patient's Chart, lab work & pertinent test results, reviewed documented beta blocker date and time   Airway Mallampati: II  TM Distance: >3 FB Neck ROM: full    Dental no notable dental hx. (+) Poor Dentition, Dental Advidsory Given   Pulmonary neg pulmonary ROS, sleep apnea and Continuous Positive Airway Pressure Ventilation , former smoker,    Pulmonary exam normal breath sounds clear to auscultation       Cardiovascular Exercise Tolerance: Good On Medications + CAD  negative cardio ROS  Atrial Fibrillation  Rhythm:regular Rate:Normal     Neuro/Psych negative neurological ROS  negative psych ROS   GI/Hepatic negative GI ROS, Neg liver ROS,   Endo/Other  negative endocrine ROS  Renal/GU negative Renal ROS  negative genitourinary   Musculoskeletal   Abdominal   Peds  Hematology negative hematology ROS (+)   Anesthesia Other Findings Echo 11/02/17  EF~ 55% H/o substance abuse per cardiologist verbal report- pt denies and says it been 10-12 years since he has used THC and/or pain pills. Cardiac Stents, one in 2002 when stopped smoking, two stents in 2012.  Denies angina/CP or other cardiac symptoms until onset of Afib; sx "dizzy", short of breath Obesity  Reproductive/Obstetrics negative OB ROS                             Anesthesia Physical Anesthesia Plan  ASA: IV  Anesthesia Plan: MAC   Post-op Pain Management:    Induction:   PONV Risk Score and Plan:   Airway Management Planned:   Additional Equipment:   Intra-op Plan:   Post-operative Plan:   Informed Consent: I have reviewed the patients History and Physical, chart, labs and discussed the procedure including the risks, benefits and alternatives for the proposed anesthesia with  the patient or authorized representative who has indicated his/her understanding and acceptance.   Dental Advisory Given  Plan Discussed with: CRNA and Anesthesiologist  Anesthesia Plan Comments:         Anesthesia Quick Evaluation

## 2017-11-19 NOTE — Progress Notes (Signed)
Electrical Cardioversion Procedure Note Martin [name] [id number] May 29, 1956  Procedure: Electrical Cardioversion Indications:  Atrial Fibrillation  Procedure Details Consent: Risks of procedure as well as the alternatives and risks of each were explained to the (patient/caregiver).  Consent for procedure obtained. Time Out: Verified patient identification, verified procedure, site/side was marked, verified correct patient position, special equipment/implants available, medications/allergies/relevent history reviewed, required imaging and test results available.  Performed  Patient placed on cardiac monitor, pulse oximetry, supplemental oxygen as necessary.  Sedation given: propofol administered by B. Idacavage, CRNA Pacer pads placed anterior and posterior chest.  Cardioverted 1 time(s).  Cardioverted at 150J.  Evaluation Findings: Post procedure EKG shows: 1st degree AV block with PACs Complications: None Patient did tolerate procedure well.   Charm Barges S 11/19/2017, 1:21 PM

## 2017-11-19 NOTE — Anesthesia Postprocedure Evaluation (Signed)
Anesthesia Post Note  Patient: Martin [name]  Procedure(s) Performed: CARDIOVERSION (N/A )  Patient location during evaluation: PACU Anesthesia Type: MAC Level of consciousness: awake and alert and patient cooperative Pain management: pain level controlled Vital Signs Assessment: post-procedure vital signs reviewed and stable Respiratory status: spontaneous breathing, nonlabored ventilation and respiratory function stable Cardiovascular status: blood pressure returned to baseline Postop Assessment: no apparent nausea or vomiting Anesthetic complications: no     Last Vitals:  Vitals:   11/19/17 1101 11/19/17 1105  BP:  (!) 167/104  Pulse: 69   Resp: 15   Temp: 36.5 C   SpO2:  95%    Last Pain:  Vitals:   11/19/17 1105  TempSrc:   PainSc: 0-No pain                 Tariah Transue J

## 2017-11-19 NOTE — Transfer of Care (Signed)
Immediate Anesthesia Transfer of Care Note  Patient: Martin [name]  Procedure(s) Performed: CARDIOVERSION (N/A )  Patient Location: PACU  Anesthesia Type:MAC  Level of Consciousness: awake, alert  and patient cooperative  Airway & Oxygen Therapy: Patient Spontanous Breathing and Patient connected to nasal cannula oxygen  Post-op Assessment: Report given to RN and Post -op Vital signs reviewed and stable  Post vital signs: Reviewed and stable  Last Vitals:  Vitals Value Taken Time  BP    Temp    Pulse 76 11/19/2017 12:53 PM  Resp    SpO2 96 % 11/19/2017 12:53 PM  Vitals shown include unvalidated device data.  Last Pain:  Vitals:   11/19/17 1105  TempSrc:   PainSc: 0-No pain         Complications: No apparent anesthesia complications

## 2017-11-19 NOTE — Interval H&P Note (Signed)
History and Physical Interval Note: No interval changes.  I will proceed with direct-current cardioversion as planned.  11/19/2017 10:59 AM  Martin [name]  has presented today for surgery, with the diagnosis of a-fib  The various methods of treatment have been discussed with the patient and family. After consideration of risks, benefits and other options for treatment, the patient has consented to  Procedure(s): CARDIOVERSION (N/A) as a surgical intervention .  The patient's history has been reviewed, patient examined, no change in status, stable for surgery.  I have reviewed the patient's chart and labs.  Questions were answered to the patient's satisfaction.     Kate Sable

## 2017-11-19 NOTE — Discharge Instructions (Signed)
Electrical Cardioversion, Care After °This sheet gives you information about how to care for yourself after your procedure. Your health care provider may also give you more specific instructions. If you have problems or questions, contact your health care provider. °What can I expect after the procedure? °After the procedure, it is common to have: °· Some redness on the skin where the shocks were given. ° °Follow these instructions at home: °· Do not drive for 24 hours if you were given a medicine to help you relax (sedative). °· Take over-the-counter and prescription medicines only as told by your health care provider. °· Ask your health care provider how to check your pulse. Check it often. °· Rest for 48 hours after the procedure or as told by your health care provider. °· Avoid or limit your caffeine use as told by your health care provider. °Contact a health care provider if: °· You feel like your heart is beating too quickly or your pulse is not regular. °· You have a serious muscle cramp that does not go away. °Get help right away if: °· You have discomfort in your chest. °· You are dizzy or you feel faint. °· You have trouble breathing or you are short of breath. °· Your speech is slurred. °· You have trouble moving an arm or leg on one side of your body. °· Your fingers or toes turn cold or blue. °This information is not intended to replace advice given to you by your health care provider. Make sure you discuss any questions you have with your health care provider. °Document Released: 03/15/2013 Document Revised: 12/27/2015 Document Reviewed: 11/29/2015 °Elsevier Interactive Patient Education © 2018 [employer]vier Inc[employer] ° °

## 2017-11-19 NOTE — Procedures (Signed)
Elective direct current cardioversion  Indication: Persistent atrial fibrillation  Description of procedure: After informed consent was obtained and preprocedure evaluation by Anesthesia, patient was taken to the procedure room. Timeout performed. Sedation was managed completely by the Anesthesia service, please refer to their documentation for details. Anterior and posterior chest pads were placed and connected to a biphasic defibrillator. A single synchronized shock of 150J was delivered resulting in restoration of sinus rhythm. The patient remained hemodynamically stable throughout, and there were no immediate complications noted. Follow-up ECG obtained.  Sadeen Wiegel, M.D., F.A.C.C.  

## 2017-11-23 ENCOUNTER — Encounter (HOSPITAL_COMMUNITY): Payer: Self-pay | Admitting: Cardiovascular Disease

## 2017-12-08 ENCOUNTER — Encounter: Payer: Self-pay | Admitting: Student

## 2017-12-08 ENCOUNTER — Ambulatory Visit (INDEPENDENT_AMBULATORY_CARE_PROVIDER_SITE_OTHER): Payer: BLUE CROSS/BLUE SHIELD | Admitting: Student

## 2017-12-08 VITALS — BP 150/90 | HR 86 | Ht 68.0 in | Wt 271.0 lb

## 2017-12-08 DIAGNOSIS — I10 Essential (primary) hypertension: Secondary | ICD-10-CM

## 2017-12-08 DIAGNOSIS — I4819 Other persistent atrial fibrillation: Secondary | ICD-10-CM

## 2017-12-08 DIAGNOSIS — I2510 Atherosclerotic heart disease of native coronary artery without angina pectoris: Secondary | ICD-10-CM | POA: Diagnosis not present

## 2017-12-08 DIAGNOSIS — I481 Persistent atrial fibrillation: Secondary | ICD-10-CM | POA: Diagnosis not present

## 2017-12-08 DIAGNOSIS — Z7901 Long term (current) use of anticoagulants: Secondary | ICD-10-CM

## 2017-12-08 DIAGNOSIS — E785 Hyperlipidemia, unspecified: Secondary | ICD-10-CM

## 2017-12-08 MED ORDER — DILTIAZEM HCL ER COATED BEADS 120 MG PO CP24: 120.0000 mg | ORAL_CAPSULE | Freq: Every day | ORAL | 4 refills | Status: DC

## 2017-12-08 NOTE — Progress Notes (Signed)
Cardiology Office Note    Date:  12/08/2017   ID:  Martin, [name] 11-28-1955, MRN [id number]  PCP:  Deloria Lair., MD  Cardiologist: Kate Sable, MD    Chief Complaint  Patient presents with  . Follow-up    s/p DCCV    History of Present Illness:    Martin [name] is a 62 y.o. male with past medical history of CAD (s/p stenting of LAD in 2001, DES to RCA and mid-LAD in 2011), persistent atrial fibrillation (diagnosed in 10/2017), carotid artery stenosis, HTN, HLD, and tobacco use who presents to the office today for follow-up from his recent DCCV.   He was last examined by myself on 11/16/2017 and reported having episodes of dizziness and generalized fatigue since being diagnosed with atrial fibrillation in the previous month. An echocardiogram had been obtained and showed a preserved EF of 55 to 60%, no regional wall motion abnormalities, mild AS, and trivial TR. He was overall tolerating anticoagulation well and given his persistent symptoms, a DCCV was recommended following 3 weeks of uninterrupted anticoagulation.  This was performed by Dr. Bronson Ing on 11/19/2017 and normal sinus rhythm was restored following administration of a single synchronized shock of 150 J. This was confirmed by EKG tracing.   In talking with the patient today, he reports being under a significant amount of stress over the past few weeks as he has been having family issues with his brother and also reports that he has been undergoing home renovations and was living in an apartment for the past 2 months.  He does feel like his fatigue improved a few days following his cardioversion and has slightly worsened since.  He denies any changes in his respiratory status. He is unsure if this is due to atrial fibrillation or his recent stress. No recent chest pain, palpitations, dyspnea on exertion, orthopnea, or PND.   He remains on Eliquis for anticoagulation and denies missing any recent doses.  No  evidence of active bleeding.   Past Medical History:  Diagnosis Date  . Atrial fibrillation ([place])    a. dx 10/2017. b. s/p DCCV on 11/19/2017 with successful conversion to NSR but back in AFIB 3 weeks later  . CAD (coronary artery disease)    a. s/p PCI to LAD 2001. b. s/p stenting to the RCA and mid LAD December 2011, residual distal RCA disease treated medically. b. nuc 02/2013 abnormal with fixed defect seen in inferoapical, mid-inferior, and basal inferior walls, indicative of myocardial scar, no evidence of ischemia, EF 41% (Ef 55-60% by echo same time).  . Carotid artery disease ([place])    carotid Doppler course left side 5069% stenosis  . Dysrhythmia    AFib  . Habitual alcohol use   . Hyperlipidemia   . Hypertension   . Low back pain   . OSA on CPAP   . Osteoarthritis   . Polysubstance abuse ([place])    use including marijuana and vicodin,which he obtained from the street  . PVC's (premature ventricular contractions)    status post Holter monitor normal sinus rhythm otherwise asymptomatic PVCs.  . Seasonal allergies     Past Surgical History:  Procedure Laterality Date  . CARDIOVERSION N/A 11/19/2017   Procedure: CARDIOVERSION;  Surgeon: Herminio Commons, MD;  Location: [facility];  Service: Cardiovascular;  Laterality: N/A;  . CORONARY ANGIOPLASTY WITH STENT PLACEMENT  2011  . TONSILLECTOMY      Current Medications: Outpatient Medications Prior to Visit  Medication Sig Dispense Refill  . allopurinol (ZYLOPRIM) 300 MG tablet Take 300 mg by mouth daily.    Marland Kitchen ALPRAZolam (XANAX) 0.5 MG tablet Take 0.5 mg by mouth at bedtime.      Marland Kitchen apixaban (ELIQUIS) 5 MG TABS tablet Take 1 tablet (5 mg total) by mouth 2 (two) times daily. 60 tablet 11  . atorvastatin (LIPITOR) 80 MG tablet Take 1 tablet (80 mg total) by mouth daily. 30 tablet 6  . Cholecalciferol (VITAMIN D-3) 1000 UNITS CAPS Take 1,000 Units by mouth daily.     . fluticasone (FLONASE) 50 MCG/ACT nasal spray Place 2  sprays into the nose daily.      . folic acid (FOLVITE) 1 MG tablet TAKE ONE TABLET BY MOUTH EVERY DAY 30 tablet 6  . lisinopril (PRINIVIL,ZESTRIL) 40 MG tablet TAKE 1 TABLET BY MOUTH EVERY DAY 90 tablet 1  . loratadine (CLARITIN) 10 MG tablet Take 10 mg by mouth daily.      . magnesium oxide (MAG-OX) 400 MG tablet Take 400 mg by mouth at bedtime.     . meclizine (ANTIVERT) 32 MG tablet Take 32 mg by mouth daily as needed for dizziness.     . Multiple Vitamin (MULTIVITAMIN) tablet Take 1 tablet by mouth 3 (three) times daily.     . naproxen (EC NAPROSYN) 500 MG EC tablet Take 500 mg by mouth 2 (two) times daily.    . nitroGLYCERIN (NITROSTAT) 0.4 MG SL tablet Place 1 tablet (0.4 mg total) under the tongue every 5 (five) minutes as needed. 25 tablet 3  . Omega-3 Fatty Acids (FISH OIL) 1200 MG CAPS Take 1,200 mg by mouth 2 (two) times daily.     . polyethylene glycol (MIRALAX / GLYCOLAX) packet Take 17 g by mouth daily as needed for mild constipation.     . triamterene-hydrochlorothiazide (MAXZIDE-25) 37.5-25 MG per tablet Take 1 tablet by mouth daily.      Marland Kitchen aspirin 81 MG EC tablet Take 1 tablet (81 mg total) by mouth daily.     No facility-administered medications prior to visit.      Allergies:   Penicillins   Social History   Socioeconomic History  . Marital status: Married    Spouse name: Not on file  . Number of children: Not on file  . Years of education: Not on file  . Highest education level: Not on file  Occupational History    Employer: UNEMPLOYED    Comment: works Social worker employed  Social Needs  . Financial resource strain: Not on file  . Food insecurity:    Worry: Not on file    Inability: Not on file  . Transportation needs:    Medical: Not on file    Non-medical: Not on file  Tobacco Use  . Smoking status: Former Smoker    Packs/day: 1.00    Years: 35.00    Pack years: 35.00    Types: Cigarettes    Start date: 09/18/1967    Last attempt to quit:  06/09/1999    Years since quitting: 18.5  . Smokeless tobacco: Never Used  Substance and Sexual Activity  . Alcohol use: Yes    Alcohol/week: 25.2 oz    Types: 42 Shots of liquor per week    Comment: drinks 6-8 ounces of liquor per day  . Drug use: Yes    Types: Marijuana    Comment: smokes marijuana occassionally-last smoked few weeks ago  . Sexual activity: Yes    Birth control/protection:  None  Lifestyle  . Physical activity:    Days per week: Not on file    Minutes per session: Not on file  . Stress: Not on file  Relationships  . Social connections:    Talks on phone: Not on file    Gets together: Not on file    Attends religious service: Not on file    Active member of club or organization: Not on file    Attends meetings of clubs or organizations: Not on file    Relationship status: Not on file  Other Topics Concern  . Not on file  Social History Narrative   No exercise     Family History:  The patient's family history includes Heart attack in his brother and father; Other in his mother; Stroke in his mother.   Review of Systems:   Please see the history of present illness.     General:  No chills, fever, night sweats or weight changes. Positive for fatigue.  Cardiovascular:  No chest pain, dyspnea on exertion, edema, orthopnea, palpitations, paroxysmal nocturnal dyspnea. Dermatological: No rash, lesions/masses Respiratory: No cough, dyspnea Urologic: No hematuria, dysuria Abdominal:   No nausea, vomiting, diarrhea, bright red blood per rectum, melena, or hematemesis Neurologic:  No visual changes, wkns, changes in mental status. Positive for dizziness.   All other systems reviewed and are otherwise negative except as noted above.   Physical Exam:    VS:  BP (!) 150/90   Pulse 86   Ht 5\' 8"  (1.727 m)   Wt 271 lb (122.9 kg)   SpO2 95%   BMI 41.21 kg/m    General: Well developed, well nourished Caucasian male appearing in no acute distress. Head:  Normocephalic, atraumatic, sclera non-icteric, no xanthomas, nares are without discharge.  Neck: No carotid bruits. JVD not elevated.  Lungs: Respirations regular and unlabored, without wheezes or rales.  Heart: Irregularly irregular. No S3 or S4.  No murmur, no rubs, or gallops appreciated. Abdomen: Soft, non-tender, non-distended with normoactive bowel sounds. No hepatomegaly. No rebound/guarding. No obvious abdominal masses. Msk:  Strength and tone appear normal for age. No joint deformities or effusions. Extremities: No clubbing or cyanosis. No lower extremity edema.  Distal pedal pulses are 2+ bilaterally. Neuro: Alert and oriented X 3. Moves all extremities spontaneously. No focal deficits noted. Psych:  Responds to questions appropriately with a normal affect. Skin: No rashes or lesions noted  Wt Readings from Last 3 Encounters:  12/08/17 271 lb (122.9 kg)  11/17/17 273 lb (123.8 kg)  11/16/17 274 lb (124.3 kg)     Studies/Labs Reviewed:   EKG:  EKG is ordered today.  The ekg ordered today demonstrates rate-controlled atrial fibrillation, HR 89, with no acute ST changes.   Recent Labs: 11/17/2017: BUN 21; Creatinine, Ser 0.84; Hemoglobin 13.5; Platelets 133; Potassium 4.2; Sodium 139   Lipid Panel    Component Value Date/Time   CHOL  05/21/2010 0530    168        ATP III CLASSIFICATION:  <200     mg/dL   Desirable  200-239  mg/dL   Borderline High  >=240    mg/dL   High          TRIG 291 (H) 05/21/2010 0530   HDL 38 (L) 05/21/2010 0530   CHOLHDL 4.4 05/21/2010 0530   VLDL 58 (H) 05/21/2010 0530   LDLCALC  05/21/2010 0530    72        Total Cholesterol/HDL:CHD Risk Coronary  Heart Disease Risk Table                     Men   Women  1/2 Average Risk   3.4   3.3  Average Risk       5.0   4.4  2 X Average Risk   9.6   7.1  3 X Average Risk  23.4   11.0        Use the calculated Patient Ratio above and the CHD Risk Table to determine the patient's CHD Risk.          ATP III CLASSIFICATION (LDL):  <100     mg/dL   Optimal  100-129  mg/dL   Near or Above                    Optimal  130-159  mg/dL   Borderline  160-189  mg/dL   High  >190     mg/dL   Very High    Additional studies/ records that were reviewed today include:   Echocardiogram: 11/02/2017 Study Conclusions  - Left ventricle: The cavity size was mildly dilated. Wall   thickness was increased in a pattern of mild LVH. Systolic   function was normal. The estimated ejection fraction was in the   range of 55% to 60%. Wall motion was normal; there were no   regional wall motion abnormalities. Indeterminate diastolic   function. - Aortic valve: Mildly calcified annulus. Trileaflet; mildly   calcified leaflets. There was mild stenosis. Mean gradient (S):   12 mm Hg. Peak gradient (S): 27 mm Hg. VTI ratio of LVOT to   aortic valve: 0.43. Valve area (VTI): 1.79 cm^2. Valve area   (Vmax): 1.65 cm^2. Valve area (Vmean): 1.76 cm^2. - Aortic root: The aortic root was mildly ectatic. - Left atrium: The atrium was at the upper limits of normal in   size. - Right atrium: Central venous pressure (est): 3 mm Hg. - Atrial septum: No defect or patent foramen ovale was identified. - Tricuspid valve: There was trivial regurgitation. - Pulmonary arteries: Systolic pressure could not be accurately   estimated. - Pericardium, extracardiac: There was no pericardial effusion.  Assessment:    1. Persistent atrial fibrillation ([place])   2. Current use of long term anticoagulation   3. CAD in native artery   4. Essential hypertension   5. Hyperlipidemia, unspecified hyperlipidemia type      Plan:   In order of problems listed above:  1. Persistent Atrial Fibrillation/ Use of Long-term Anticoagulation - was initially diagnosed with atrial fibrillation in 10/2017 and while HR was overall well-controlled, he reported worsening dizziness and fatigue in the setting of his arrhythmia. Following being on  Eliquis for 3 weeks, he underwent successful DCCV on 11/19/2017. - He did experience some improvement in his symptoms the days following his cardioversion but feels like this has slightly worsened since. Repeat EKG today shows that he is back in rate-controlled atrial fibrillation. It is difficult to discern if his fatigue is due to worsening stress and sleep habits as compared to his arrhythmia. He is very active at baseline and his heart rate is likely becoming elevated with activity. Will add Cardizem CD 120 mg daily to assist with rate control (will try this over BB therapy initially in the setting of fatigue and his concern of possible side effects with BB therapy). He strongly wishes to be back in NSR and we discussed further management  options including antiarrhythmic medications and possible ablation if he failed antiarrhythmic therapy. Not a candidate for Flecainide given his known CAD. In the setting of his overall low-symptom profile, he is in agreement to try a rate-controlling medication initially. Will arrange follow-up with EP to further discuss more advanced options if symptoms persist. - he denies any evidence of active bleeding. Remains on Eliquis for anticoagulation.   2. CAD - s/p stenting of LAD in 2001 and DES to RCA and mid-LAD in 2011. Recent echocardiogram in 10/2017 showed a preserved EF with no regional wall motion abnormalities. - He denies any recent chest pain or dyspnea on exertion. - Continue statin therapy. Not on ASA given the need for anticoagulation.  3. HTN - BP is elevated at 150/90 during today's visit. He reports SBP is usually in the 140's at home. - Continue Lisinopril 40 mg daily with the addition of Cardizem CD as outlined above.  4. HLD  - FLP in 09/2017 showed total cholesterol 152, HDL 46, and LDL 68.  At goal of LDL less than 70 in the setting of known CAD. - Continue Atorvastatin 80 mg daily.    Medication Adjustments/Labs and Tests  Ordered: Current medicines are reviewed at length with the patient today.  Concerns regarding medicines are outlined above.  Medication changes, Labs and Tests ordered today are listed in the Patient Instructions below. Patient Instructions  Medication Instructions:   Your physician has recommended you make the following change in your medication:   Start cardizem cd 120 mg by mouth daily.  Continue all other medications the same.  Labwork:  NONE  Testing/Procedures:  NONE  Follow-Up:  Your physician recommends that you schedule a follow-up appointment in: 6-8 weeks with an EP doctor.  Any Other Special Instructions Will Be Listed Below (If Applicable).  If you need a refill on your cardiac medications before your next appointment, please call your pharmacy.   Signed, Erma Heritage, PA-C  12/08/2017 4:18 PM    [facility] [address] [place], [place] [address] Phone: [phone]

## 2017-12-08 NOTE — Patient Instructions (Addendum)
Medication Instructions:   Your physician has recommended you make the following change in your medication:   Start cardizem cd 120 mg by mouth daily.  Continue all other medications the same.  Labwork:  NONE  Testing/Procedures:  NONE  Follow-Up:  Your physician recommends that you schedule a follow-up appointment in: 6-8 weeks with an EP doctor.  Any Other Special Instructions Will Be Listed Below (If Applicable).  If you need a refill on your cardiac medications before your next appointment, please call your pharmacy.

## 2017-12-18 ENCOUNTER — Other Ambulatory Visit: Payer: Self-pay | Admitting: Cardiovascular Disease

## 2018-01-04 DIAGNOSIS — J302 Other seasonal allergic rhinitis: Secondary | ICD-10-CM | POA: Insufficient documentation

## 2018-01-06 DIAGNOSIS — G479 Sleep disorder, unspecified: Secondary | ICD-10-CM | POA: Insufficient documentation

## 2018-01-17 ENCOUNTER — Ambulatory Visit (INDEPENDENT_AMBULATORY_CARE_PROVIDER_SITE_OTHER): Payer: BLUE CROSS/BLUE SHIELD | Admitting: Internal Medicine

## 2018-01-17 ENCOUNTER — Encounter: Payer: Self-pay | Admitting: Internal Medicine

## 2018-01-17 VITALS — BP 160/90 | HR 55 | Ht 68.0 in | Wt 272.4 lb

## 2018-01-17 DIAGNOSIS — I481 Persistent atrial fibrillation: Secondary | ICD-10-CM

## 2018-01-17 DIAGNOSIS — I4819 Other persistent atrial fibrillation: Secondary | ICD-10-CM

## 2018-01-17 NOTE — Patient Instructions (Signed)
Medication Instructions:  Your physician recommends that you continue on your current medications as directed. Please refer to the Current Medication list given to you today.   Labwork: NONE   Testing/Procedures: NONE   Follow-Up: Your physician recommends that you schedule a follow-up appointment in: 5 months with Dr. Lovena Le.    Any Other Special Instructions Will Be Listed Below (If Applicable).     If you need a refill on your cardiac medications before your next appointment, please call your pharmacy. Thank you for choosing [facility]!

## 2018-01-17 NOTE — Progress Notes (Addendum)
HPI [name] is referred today by [place] for evalution of atria lfib. He has a h/o weakness and dizziness. His EF was preserved. He was cardioverted in June. In July, he was back in atrial fib with a controlled VR. Over the past few months he has felt a little bit better. No syncope. He feels a little dizzy in the shower at times. He did not know for sure that he has gone back to rhythm.   Allergies  Allergen Reactions  . Penicillins Hives and Rash    Has patient had a PCN reaction causing immediate rash, facial/tongue/throat swelling, SOB or lightheadedness with hypotension: Unknown Has patient had a PCN reaction causing severe rash involving mucus membranes or skin necrosis: Unknown Has patient had a PCN reaction that required hospitalization: No Has patient had a PCN reaction occurring within the last 10 years: No If all of the above answers are "NO", then may proceed with Cephalosporin use.       Current Outpatient Medications  Medication Sig Dispense Refill  . allopurinol (ZYLOPRIM) 300 MG tablet Take 300 mg by mouth daily.    Marland Kitchen ALPRAZolam (XANAX) 0.5 MG tablet Take 0.5 mg by mouth at bedtime.      Marland Kitchen apixaban (ELIQUIS) 5 MG TABS tablet Take 1 tablet (5 mg total) by mouth 2 (two) times daily. 60 tablet 11  . atorvastatin (LIPITOR) 80 MG tablet TAKE 1 TABLET BY MOUTH EVERY DAY 90 tablet 2  . Cholecalciferol (VITAMIN D-3) 1000 UNITS CAPS Take 1,000 Units by mouth daily.     Marland Kitchen diltiazem (CARDIZEM CD) 120 MG 24 hr capsule Take 1 capsule (120 mg total) by mouth daily. 30 capsule 4  . fluticasone (FLONASE) 50 MCG/ACT nasal spray Place 2 sprays into the nose daily.      . folic acid (FOLVITE) 1 MG tablet TAKE ONE TABLET BY MOUTH EVERY DAY 30 tablet 6  . lisinopril (PRINIVIL,ZESTRIL) 40 MG tablet TAKE 1 TABLET BY MOUTH EVERY DAY 90 tablet 1  . loratadine (CLARITIN) 10 MG tablet Take 10 mg by mouth daily.      . magnesium oxide (MAG-OX) 400 MG tablet Take 400 mg by mouth  at bedtime.     . meclizine (ANTIVERT) 32 MG tablet Take 32 mg by mouth daily as needed for dizziness.     . Multiple Vitamin (MULTIVITAMIN) tablet Take 1 tablet by mouth 3 (three) times daily.     . naproxen (EC NAPROSYN) 500 MG EC tablet Take 500 mg by mouth 2 (two) times daily.    . nitroGLYCERIN (NITROSTAT) 0.4 MG SL tablet Place 1 tablet (0.4 mg total) under the tongue every 5 (five) minutes as needed. 25 tablet 3  . Omega-3 Fatty Acids (FISH OIL) 1200 MG CAPS Take 1,200 mg by mouth 2 (two) times daily.     . polyethylene glycol (MIRALAX / GLYCOLAX) packet Take 17 g by mouth daily as needed for mild constipation.     . traZODone (DESYREL) 50 MG tablet Take 1 tablet by mouth at bedtime.  2  . triamterene-hydrochlorothiazide (MAXZIDE-25) 37.5-25 MG per tablet Take 1 tablet by mouth daily.       No current facility-administered medications for this visit.      Past Medical History:  Diagnosis Date  . Atrial fibrillation ([place])    a. dx 10/2017. b. s/p DCCV on 11/19/2017 with successful conversion to NSR but back in AFIB 3 weeks later  . CAD (coronary artery disease)  a. s/p PCI to LAD 2001. b. s/p stenting to the RCA and mid LAD December 2011, residual distal RCA disease treated medically. b. nuc 02/2013 abnormal with fixed defect seen in inferoapical, mid-inferior, and basal inferior walls, indicative of myocardial scar, no evidence of ischemia, EF 41% (Ef 55-60% by echo same time).  . Carotid artery disease ([place])    carotid Doppler course left side 5069% stenosis  . Dysrhythmia    AFib  . Habitual alcohol use   . Hyperlipidemia   . Hypertension   . Low back pain   . OSA on CPAP   . Osteoarthritis   . Polysubstance abuse ([place])    use including marijuana and vicodin,which he obtained from the street  . PVC's (premature ventricular contractions)    status post Holter monitor normal sinus rhythm otherwise asymptomatic PVCs.  . Seasonal allergies     ROS:   All systems reviewed  and negative except as noted in the HPI.   Past Surgical History:  Procedure Laterality Date  . CARDIOVERSION N/A 11/19/2017   Procedure: CARDIOVERSION;  Surgeon: Herminio Commons, MD;  Location[facility];  Service: Cardiovascular;  Laterality: N/A;  . CORONARY ANGIOPLASTY WITH STENT PLACEMENT  2011  . TONSILLECTOMY       Family History  Problem Relation Age of Onset  . Stroke Mother   . Other Mother        small bowel obstruction  . Heart attack Father   . Heart attack Brother      Social History   Socioeconomic History  . Marital status: Married    Spouse name: Not on file  . Number of children: Not on file  . Years of education: Not on file  . Highest education level: Not on file  Occupational History    Employer: UNEMPLOYED    Comment: works Social worker employed  Social Needs  . Financial resource strain: Not on file  . Food insecurity:    Worry: Not on file    Inability: Not on file  . Transportation needs:    Medical: Not on file    Non-medical: Not on file  Tobacco Use  . Smoking status: Former Smoker    Packs/day: 1.00    Years: 35.00    Pack years: 35.00    Types: Cigarettes    Start date: 09/18/1967    Last attempt to quit: 06/09/1999    Years since quitting: 18.6  . Smokeless tobacco: Never Used  Substance and Sexual Activity  . Alcohol use: Yes    Alcohol/week: 42.0 standard drinks    Types: 42 Shots of liquor per week    Comment: drinks 6-8 ounces of liquor per day  . Drug use: Yes    Types: Marijuana    Comment: smokes marijuana occassionally-last smoked few weeks ago  . Sexual activity: Yes    Birth control/protection: None  Lifestyle  . Physical activity:    Days per week: Not on file    Minutes per session: Not on file  . Stress: Not on file  Relationships  . Social connections:    Talks on phone: Not on file    Gets together: Not on file    Attends religious service: Not on file    Active member of club or  organization: Not on file    Attends meetings of clubs or organizations: Not on file    Relationship status: Not on file  . Intimate partner violence:    Fear of  current or ex partner: Not on file    Emotionally abused: Not on file    Physically abused: Not on file    Forced sexual activity: Not on file  Other Topics Concern  . Not on file  Social History Narrative   No exercise     BP (!) 160/90 (BP Location: Right Arm, Cuff Size: Large)   Pulse (!) 55   Ht 5\' 8"  (1.727 m)   Wt 272 lb 6.4 oz (123.6 kg)   SpO2 95%   BMI 41.42 kg/m   Physical Exam:  Well appearing overweight middle aged man, NAD HEENT: Unremarkable Neck:  6 cm JVD, no thyromegally Lymphatics:  No adenopathy Back:  No CVA tenderness Lungs:  Clear with no wheezes HEART:  Regular rate rhythm, no murmurs, no rubs, no clicks Abd:  soft, positive bowel sounds, no organomegally, no rebound, no guarding Ext:  2 plus pulses, no edema, no cyanosis, no clubbing Skin:  No rashes no nodules Neuro:  CN II through XII intact, motor grossly intact  EKG - sinus bradycardia    Assess/Plan: 1. PAf - he has gone back to NSR. He did not know it except that he thought he felt a little better. I discussed the treatment options in detail. For now I would recommend he continue his cardizem. We discussed inpatient initiation of dofetilide. He is not symptomatic enough at this point to recommend this.  2. HTN - his pressure is up. He notes that it is usually much better. He admits to dietary indiscretion. I have recommended we try to lose some weight.  3. Obesity - we discussed the importance of weight loss.  Mikle Bosworth.D.

## 2018-01-17 NOTE — Addendum Note (Signed)
Addended by: Evans Lance on: 01/17/2018 09:47 AM   Modules accepted: Level of Service

## 2018-03-01 ENCOUNTER — Other Ambulatory Visit: Payer: Self-pay

## 2018-03-01 ENCOUNTER — Encounter (HOSPITAL_COMMUNITY): Payer: Self-pay

## 2018-03-01 ENCOUNTER — Emergency Department (HOSPITAL_COMMUNITY)
Admission: EM | Admit: 2018-03-01 | Discharge: 2018-03-01 | Disposition: A | Discharge status: Home / Self Care | Payer: BLUE CROSS/BLUE SHIELD | Attending: Emergency Medicine | Admitting: Emergency Medicine

## 2018-03-01 DIAGNOSIS — I2510 Atherosclerotic heart disease of native coronary artery without angina pectoris: Secondary | ICD-10-CM | POA: Insufficient documentation

## 2018-03-01 DIAGNOSIS — I10 Essential (primary) hypertension: Secondary | ICD-10-CM | POA: Insufficient documentation

## 2018-03-01 DIAGNOSIS — Z87891 Personal history of nicotine dependence: Secondary | ICD-10-CM | POA: Diagnosis not present

## 2018-03-01 DIAGNOSIS — R42 Dizziness and giddiness: Secondary | ICD-10-CM

## 2018-03-01 DIAGNOSIS — Z7901 Long term (current) use of anticoagulants: Secondary | ICD-10-CM | POA: Diagnosis not present

## 2018-03-01 DIAGNOSIS — Z79899 Other long term (current) drug therapy: Secondary | ICD-10-CM | POA: Insufficient documentation

## 2018-03-01 LAB — BASIC METABOLIC PANEL
Anion gap: 8 (ref 5–15)
BUN: 24 mg/dL — ABNORMAL HIGH (ref 8–23)
CO2: 25 mmol/L (ref 22–32)
Calcium: 9.4 mg/dL (ref 8.9–10.3)
Chloride: 109 mmol/L (ref 98–111)
Creatinine, Ser: 0.88 mg/dL (ref 0.61–1.24)
GFR calc Af Amer: >60 mL/min (ref >60)
Glucose, Bld: 136 mg/dL — ABNORMAL HIGH (ref 70–99)
POTASSIUM: 3.5 mmol/L (ref 3.5–5.1)
Sodium: 142 mmol/L (ref 135–145)

## 2018-03-01 LAB — CBC WITH DIFFERENTIAL/PLATELET
Basophils Absolute: 0 10*3/uL (ref 0.0–0.1)
Basophils Relative: 0 %
EOS PCT: 2 %
Eosinophils Absolute: 0.1 10*3/uL (ref 0.0–0.7)
HCT: 43.3 % (ref 39.0–52.0)
Hemoglobin: 14.4 g/dL (ref 13.0–17.0)
LYMPHS ABS: 1.7 10*3/uL (ref 0.7–4.0)
LYMPHS PCT: 23 %
MCH: 32.1 pg (ref 26.0–34.0)
MCHC: 33.3 g/dL (ref 30.0–36.0)
MCV: 96.7 fL (ref 78.0–100.0)
MONO ABS: 0.5 10*3/uL (ref 0.1–1.0)
Monocytes Relative: 7 %
NEUTROS ABS: 4.8 10*3/uL (ref 1.7–7.7)
Neutrophils Relative %: 68 %
PLATELETS: 136 10*3/uL — AB (ref 150–400)
RBC: 4.48 MIL/uL (ref 4.22–5.81)
RDW: 13.4 % (ref 11.5–15.5)
WBC: 7.1 10*3/uL (ref 4.0–10.5)

## 2018-03-01 LAB — ETHANOL: Alcohol, Ethyl (B): <10 mg/dL (ref <10)

## 2018-03-01 MED ORDER — SODIUM CHLORIDE 0.9 % IV BOLUS
1000.0000 mL | Freq: Once | INTRAVENOUS | Status: AC
  Administered 2018-03-01: 1000 mL via INTRAVENOUS

## 2018-03-01 MED ORDER — ONDANSETRON HCL 4 MG/2ML IJ SOLN
4.0000 mg | Freq: Once | INTRAMUSCULAR | Status: AC
  Administered 2018-03-01: 4 mg via INTRAVENOUS
  Filled 2018-03-01: qty 2

## 2018-03-01 MED ORDER — DIAZEPAM 5 MG PO TABS
5.0000 mg | ORAL_TABLET | Freq: Once | ORAL | Status: AC
  Administered 2018-03-01: 5 mg via ORAL
  Filled 2018-03-01: qty 1

## 2018-03-01 MED ORDER — MECLIZINE HCL 12.5 MG PO TABS
25.0000 mg | ORAL_TABLET | Freq: Once | ORAL | Status: AC
  Administered 2018-03-01: 25 mg via ORAL
  Filled 2018-03-01: qty 2

## 2018-03-01 NOTE — ED Notes (Signed)
Patient ambulated to the restroom with no problems ambulate. Patient did state that he felt off balance.

## 2018-03-01 NOTE — ED Notes (Signed)
EDP at bedside  

## 2018-03-01 NOTE — ED Notes (Signed)
Patient given oral fluids and crackers to eat.

## 2018-03-01 NOTE — ED Triage Notes (Signed)
Pt reports he has been outside since 7 am working on gutters and became dizzy . Pt reports he has been drinking good today. Currently on eliquis for afib. Denies pain

## 2018-03-01 NOTE — ED Notes (Signed)
Attempted IV access x 2. Charge RN at bedside to attempt.

## 2018-03-01 NOTE — ED Provider Notes (Signed)
[facility] EMERGENCY DEPARTMENT Provider Note   CSN: [id number] Arrival date & time: 03/01/18  1622     History   Chief Complaint Chief Complaint  Patient presents with  . Dizziness    HPI Martin [name] is a 62 y.o. male.  HPI  This is a 62 year old male with a history of atrial fibrillation, coronary artery disease, hypertension, hyperlipidemia who presents with dizziness.  Patient reports that he has been outside working most the day.  He thinks he has stayed hydrated and has had sufficient water.  He reports getting out of his truck and having acute onset of dizziness.  He initially described as lightheadedness but subsequently stated he had room spinning.  He tried to drive home but had to pull off.  He does have a history of vertigo in the past which was somewhat similar.  He has had some nausea without vomiting.  Nothing seems to make his symptoms better or worse.  Denies any vision changes, weakness, numbness, strokelike symptoms.  He does take blood thinners for atrial fibrillation.  Denies any alcohol use.  Past Medical History:  Diagnosis Date  . Atrial fibrillation ([place])    a. dx 10/2017. b. s/p DCCV on 11/19/2017 with successful conversion to NSR but back in AFIB 3 weeks later  . CAD (coronary artery disease)    a. s/p PCI to LAD 2001. b. s/p stenting to the RCA and mid LAD December 2011, residual distal RCA disease treated medically. b. nuc 02/2013 abnormal with fixed defect seen in inferoapical, mid-inferior, and basal inferior walls, indicative of myocardial scar, no evidence of ischemia, EF 41% (Ef 55-60% by echo same time).  . Carotid artery disease ([place])    carotid Doppler course left side 5069% stenosis  . Dysrhythmia    AFib  . Habitual alcohol use   . Hyperlipidemia   . Hypertension   . Low back pain   . OSA on CPAP   . Osteoarthritis   . Polysubstance abuse ([place])    use including marijuana and vicodin,which he obtained from the street  . PVC's  (premature ventricular contractions)    status post Holter monitor normal sinus rhythm otherwise asymptomatic PVCs.  . Seasonal allergies     Patient Active Problem List   Diagnosis Date Noted  . OSA on CPAP 05/27/2016  . Obesity 06/10/2011  . Insomnia 06/10/2011  . PVC's (premature ventricular contractions)   . CAD (coronary artery disease)   . Carotid artery disease ([place])   . DEGENERATIVE JOINT DISEASE 07/28/2010  . Dyslipidemia 06/17/2010  . SUBSTANCE ABUSE, MULTIPLE 06/17/2010  . Essential hypertension 06/17/2010  . CAD 06/17/2010  . BRADYCARDIA 06/17/2010    Past Surgical History:  Procedure Laterality Date  . CARDIOVERSION N/A 11/19/2017   Procedure: CARDIOVERSION;  Surgeon: Herminio Commons, MD;  Location: [facility] ENDO SUITE;  Service: Cardiovascular;  Laterality: N/A;  . CORONARY ANGIOPLASTY WITH STENT PLACEMENT  2011  . TONSILLECTOMY          Home Medications    Prior to Admission medications   Medication Sig Start Date End Date Taking? Authorizing Provider  allopurinol (ZYLOPRIM) 300 MG tablet Take 300 mg by mouth daily.    [provider]  ALPRAZolam Duanne Moron) 0.5 MG tablet Take 0.5 mg by mouth at bedtime.      [provider]  apixaban (ELIQUIS) 5 MG TABS tablet Take 1 tablet (5 mg total) by mouth 2 (two) times daily. 10/28/17   Charlie Pitter, PA-C  atorvastatin (LIPITOR) 80 MG tablet TAKE 1 TABLET BY MOUTH EVERY DAY 12/20/17   Herminio Commons, MD  Cholecalciferol (VITAMIN D-3) 1000 UNITS CAPS Take 1,000 Units by mouth daily.     [provider]  diltiazem (CARDIZEM CD) 120 MG 24 hr capsule Take 1 capsule (120 mg total) by mouth daily. 12/08/17   Strader, Fransisco Hertz, PA-C  fluticasone (FLONASE) 50 MCG/ACT nasal spray Place 2 sprays into the nose daily.      [provider]  folic acid (FOLVITE) 1 MG tablet TAKE ONE TABLET BY MOUTH EVERY DAY 02/07/12   de Stanford Scotland, MD  lisinopril (PRINIVIL,ZESTRIL) 40 MG tablet TAKE 1 TABLET BY  MOUTH EVERY DAY 07/26/17   Herminio Commons, MD  loratadine (CLARITIN) 10 MG tablet Take 10 mg by mouth daily.      [provider]  magnesium oxide (MAG-OX) 400 MG tablet Take 400 mg by mouth at bedtime.     [provider]  meclizine (ANTIVERT) 32 MG tablet Take 32 mg by mouth daily as needed for dizziness.     [provider]  Multiple Vitamin (MULTIVITAMIN) tablet Take 1 tablet by mouth 3 (three) times daily.     [provider]  naproxen (EC NAPROSYN) 500 MG EC tablet Take 500 mg by mouth 2 (two) times daily. 10/23/17   [provider]  nitroGLYCERIN (NITROSTAT) 0.4 MG SL tablet Place 1 tablet (0.4 mg total) under the tongue every 5 (five) minutes as needed. 05/11/17   Herminio Commons, MD  Omega-3 Fatty Acids (FISH OIL) 1200 MG CAPS Take 1,200 mg by mouth 2 (two) times daily.     [provider]  polyethylene glycol (MIRALAX / GLYCOLAX) packet Take 17 g by mouth daily as needed for mild constipation.     [provider]  traZODone (DESYREL) 50 MG tablet Take 1 tablet by mouth at bedtime. 01/04/18   [provider]  triamterene-hydrochlorothiazide (MAXZIDE-25) 37.5-25 MG per tablet Take 1 tablet by mouth daily.      [provider]    Family History Family History  Problem Relation Age of Onset  . Stroke Mother   . Other Mother        small bowel obstruction  . Heart attack Father   . Heart attack Brother     Social History Social History   Tobacco Use  . Smoking status: Former Smoker    Packs/day: 1.00    Years: 35.00    Pack years: 35.00    Types: Cigarettes    Start date: 09/18/1967    Last attempt to quit: 06/09/1999    Years since quitting: 18.7  . Smokeless tobacco: Never Used  Substance Use Topics  . Alcohol use: Yes    Alcohol/week: 42.0 standard drinks    Types: 42 Shots of liquor per week    Comment: 1/2 gallon og liquor per week  . Drug use: Yes    Types: Marijuana    Comment:  smokes marijuana occassionally-last smoked few weeks ago     Allergies   Penicillins   Review of Systems Review of Systems  Constitutional: Negative for fever.  Respiratory: Negative for shortness of breath.   Cardiovascular: Negative for chest pain.  Gastrointestinal: Positive for nausea. Negative for abdominal pain and vomiting.  Genitourinary: Negative for enuresis.  Neurological: Positive for dizziness. Negative for speech difficulty, weakness and numbness.  All other systems reviewed and are negative.    Physical Exam Updated  Vital Signs BP (!) 163/81 (BP Location: Left Arm)   Pulse 65   Temp 97.9 F (36.6 C) (Oral)   Resp 18   Ht 1.727 m (5\' 8" )   Wt 122.5 kg   SpO2 98%   BMI 41.05 kg/m   Physical Exam  Constitutional: He is oriented to person, place, and time. He appears well-developed and well-nourished.  Overweight, nontoxic-appearing  HENT:  Head: Normocephalic and atraumatic.  Eyes: Pupils are equal, round, and reactive to light.  Rightward horizontal nystagmus  Neck: Neck supple.  Cardiovascular: Normal rate, regular rhythm and normal heart sounds.  No murmur heard. Pulmonary/Chest: Effort normal and breath sounds normal. No respiratory distress. He has no wheezes.  Abdominal: Soft. Bowel sounds are normal. There is no tenderness. There is no rebound.  Musculoskeletal: He exhibits no edema.  Neurological: He is alert and oriented to person, place, and time.  Cranial nerves II through XII intact, 5 out of 5 strength in all 4 extremities, no dysmetria to finger-nose-finger  Skin: Skin is warm and dry.  Psychiatric: He has a normal mood and affect.  Nursing note and vitals reviewed.    ED Treatments / Results  Labs (all labs ordered are listed, but only abnormal results are displayed) Labs Reviewed  CBC WITH DIFFERENTIAL/PLATELET - Abnormal; Notable for the following components:      Result Value   Platelets 136 (*)    All other components within  normal limits  BASIC METABOLIC PANEL - Abnormal; Notable for the following components:   Glucose, Bld 136 (*)    BUN 24 (*)    All other components within normal limits  ETHANOL    EKG EKG Interpretation  Date/Time:  Tuesday March 01 2018 16:43:37 EDT Ventricular Rate:  57 PR Interval:    QRS Duration: 111 QT Interval:  407 QTC Calculation: 397 R Axis:   -3 Text Interpretation:  Sinus rhythm Atrial premature complex Prolonged PR interval Incomplete left bundle branch block Anterior Q waves, possibly due to ILBBB No significant change since last tracing Confirmed by Thayer Jew [phone]) on 03/01/2018 4:46:13 PM   Radiology No results found.  Procedures Procedures (including critical care time)  Medications Ordered in ED Medications  sodium chloride 0.9 % bolus 1,000 mL ( Intravenous Stopped 03/01/18 1821)  meclizine (ANTIVERT) tablet 25 mg (25 mg Oral Given 03/01/18 1714)  ondansetron (ZOFRAN) injection 4 mg (4 mg Intravenous Given 03/01/18 1714)  diazepam (VALIUM) tablet 5 mg (5 mg Oral Given 03/01/18 1907)     Initial Impression / Assessment and Plan / ED Course  I have reviewed the triage vital signs and the nursing notes.  Pertinent labs & imaging results that were available during my care of the patient were reviewed by me and considered in my medical decision making (see chart for details).  Clinical Course as of Mar 01 2012  Tue Mar 01, 2018  1906 Ambulate to the bathroom without difficulty.  No ataxia.  Does report some ongoing dizziness.  Patient dosed with Valium.  Will recheck.   [CH]    Clinical Course User Index [CH] Tj Kitchings, Barbette Hair, MD    Presents with dizziness.  Describes with lightheadedness and room spinning dizziness.  History of vertigo.  He does have a history of atrial fibrillation but reports compliance with his blood thinners.  He has no evidence of cerebellar dysfunction on exam.  Doubt central vertigo or stroke.  Question dehydration  versus benign positional vertigo given his history.  Lab work obtained and largely reassuring.  Alcohol level was negative.  Patient was given fluids and meclizine initially.  He was able to ambulate independently with to the bathroom without any evidence of ataxia.  He did report some persistent dizziness.  He was subsequently given Valium and on recheck stated he felt much better.  Suspect benign positional vertigo.  No indication at this time of stroke.  Recommend hydration and meclizine at home.  Patient reports that he has a prescription for meclizine already.  After history, exam, and medical workup I feel the patient has been appropriately medically screened and is safe for discharge home. Pertinent diagnoses were discussed with the patient. Patient was given return precautions.   Final Clinical Impressions(s) / ED Diagnoses   Final diagnoses:  Vertigo    ED Discharge Orders    None       Merryl Hacker, MD 03/01/18 2014

## 2018-03-04 ENCOUNTER — Other Ambulatory Visit: Payer: Self-pay | Admitting: Student

## 2018-03-09 ENCOUNTER — Telehealth: Payer: Self-pay | Admitting: Cardiovascular Disease

## 2018-03-09 NOTE — Telephone Encounter (Signed)
That would be ok.

## 2018-03-09 NOTE — Telephone Encounter (Signed)
Patient would like to discuss his medication.  PCP has made changes

## 2018-03-09 NOTE — Telephone Encounter (Signed)
Patient informed. Copy sent PCP.

## 2018-03-09 NOTE — Telephone Encounter (Signed)
Patient was taken off diclofenac and put on naproxen to treat his arthritis pain in which he says is ineffective. Patient said he was given hydrocodone to treat his arthritis pain and says that he doesn't want to take this medication because he is still working and it also causes him constipation problems. Patient wants to know if he can take the diclofenac sparingly for his arthritis pain. Patient said without the diclofenac, he has problems getting out of the bed. Please advise.

## 2018-04-11 ENCOUNTER — Ambulatory Visit (INDEPENDENT_AMBULATORY_CARE_PROVIDER_SITE_OTHER): Payer: BLUE CROSS/BLUE SHIELD | Admitting: Otolaryngology

## 2018-04-11 DIAGNOSIS — R42 Dizziness and giddiness: Secondary | ICD-10-CM | POA: Diagnosis not present

## 2018-04-11 DIAGNOSIS — H903 Sensorineural hearing loss, bilateral: Secondary | ICD-10-CM

## 2018-04-11 DIAGNOSIS — H9313 Tinnitus, bilateral: Secondary | ICD-10-CM

## 2018-04-12 ENCOUNTER — Other Ambulatory Visit (INDEPENDENT_AMBULATORY_CARE_PROVIDER_SITE_OTHER): Payer: Self-pay | Admitting: Otolaryngology

## 2018-04-12 DIAGNOSIS — H918X9 Other specified hearing loss, unspecified ear: Secondary | ICD-10-CM

## 2018-04-18 ENCOUNTER — Ambulatory Visit (HOSPITAL_COMMUNITY)
Admission: RE | Admit: 2018-04-18 | Discharge: 2018-04-18 | Disposition: A | Discharge status: Home / Self Care | Payer: BLUE CROSS/BLUE SHIELD | Source: Ambulatory Visit | Attending: Otolaryngology | Admitting: Otolaryngology

## 2018-04-18 DIAGNOSIS — H918X9 Other specified hearing loss, unspecified ear: Secondary | ICD-10-CM

## 2018-04-18 DIAGNOSIS — H9042 Sensorineural hearing loss, unilateral, left ear, with unrestricted hearing on the contralateral side: Secondary | ICD-10-CM | POA: Diagnosis present

## 2018-04-18 LAB — POCT I-STAT CREATININE: CREATININE: 0.9 mg/dL (ref 0.61–1.24)

## 2018-04-18 MED ORDER — GADOBUTROL 1 MMOL/ML IV SOLN
10.0000 mL | Freq: Once | INTRAVENOUS | Status: AC | PRN
  Administered 2018-04-18: 10 mL via INTRAVENOUS

## 2018-04-23 DIAGNOSIS — F419 Anxiety disorder, unspecified: Secondary | ICD-10-CM | POA: Insufficient documentation

## 2018-05-10 ENCOUNTER — Encounter: Payer: Self-pay | Admitting: Cardiovascular Disease

## 2018-05-10 ENCOUNTER — Ambulatory Visit: Payer: BLUE CROSS/BLUE SHIELD | Admitting: Cardiovascular Disease

## 2018-05-10 VITALS — BP 152/82 | HR 56 | Ht 68.0 in | Wt 273.0 lb

## 2018-05-10 DIAGNOSIS — I25118 Atherosclerotic heart disease of native coronary artery with other forms of angina pectoris: Secondary | ICD-10-CM | POA: Diagnosis not present

## 2018-05-10 DIAGNOSIS — Z955 Presence of coronary angioplasty implant and graft: Secondary | ICD-10-CM | POA: Diagnosis not present

## 2018-05-10 DIAGNOSIS — Z7901 Long term (current) use of anticoagulants: Secondary | ICD-10-CM | POA: Diagnosis not present

## 2018-05-10 DIAGNOSIS — E785 Hyperlipidemia, unspecified: Secondary | ICD-10-CM

## 2018-05-10 DIAGNOSIS — I10 Essential (primary) hypertension: Secondary | ICD-10-CM

## 2018-05-10 DIAGNOSIS — E6609 Other obesity due to excess calories: Secondary | ICD-10-CM

## 2018-05-10 DIAGNOSIS — I4819 Other persistent atrial fibrillation: Secondary | ICD-10-CM

## 2018-05-10 NOTE — Patient Instructions (Signed)

## 2018-05-10 NOTE — Progress Notes (Signed)
SUBJECTIVE: The patient presents for routine follow-up.  He has a history of persistent atrial fibrillation and was evaluated by EP on 01/17/2018.  It is recommended he stay on diltiazem. I performed direct-current cardioversion on him on 11/19/2017.  He also has a history of coronary disease status post stenting of the LAD in 2001 and drug-eluting stent placement to the RCA and mid LAD in 2011.  He is currently doing well and denies chest pain, palpitations, and shortness of breath.  He had an episode of vertigo earlier in the fall and he has had a slow and steady improvement from this.  He has bilateral hearing loss and needs hearing aids.  He recently got off a vacation and said he let his diet go.  He wants to begin eating better and exercising to lose weight.     Review of Systems: As per "subjective", otherwise negative.  Allergies  Allergen Reactions  . Penicillins Hives and Rash    Has patient had a PCN reaction causing immediate rash, facial/tongue/throat swelling, SOB or lightheadedness with hypotension: Unknown Has patient had a PCN reaction causing severe rash involving mucus membranes or skin necrosis: Unknown Has patient had a PCN reaction that required hospitalization: No Has patient had a PCN reaction occurring within the last 10 years: No If all of the above answers are "NO", then may proceed with Cephalosporin use.      Current Outpatient Medications  Medication Sig Dispense Refill  . allopurinol (ZYLOPRIM) 300 MG tablet Take 300 mg by mouth daily.    Marland Kitchen apixaban (ELIQUIS) 5 MG TABS tablet Take 1 tablet (5 mg total) by mouth 2 (two) times daily. 60 tablet 11  . atorvastatin (LIPITOR) 80 MG tablet TAKE 1 TABLET BY MOUTH EVERY DAY 90 tablet 2  . Cholecalciferol (VITAMIN D-3) 1000 UNITS CAPS Take 1,000 Units by mouth daily.     . diclofenac (VOLTAREN) 75 MG EC tablet Take 75 mg by mouth 2 (two) times daily.    Marland Kitchen diltiazem (CARDIZEM CD) 120 MG 24 hr capsule TAKE  1 CAPSULE BY MOUTH EVERY DAY 90 capsule 1  . fluticasone (FLONASE) 50 MCG/ACT nasal spray Place 2 sprays into the nose daily.      . folic acid (FOLVITE) 1 MG tablet TAKE ONE TABLET BY MOUTH EVERY DAY 30 tablet 6  . lisinopril (PRINIVIL,ZESTRIL) 40 MG tablet TAKE 1 TABLET BY MOUTH EVERY DAY 90 tablet 1  . loratadine (CLARITIN) 10 MG tablet Take 10 mg by mouth daily.      . magnesium oxide (MAG-OX) 400 MG tablet Take 400 mg by mouth at bedtime.     . meclizine (ANTIVERT) 32 MG tablet Take 32 mg by mouth daily as needed for dizziness.     . Multiple Vitamin (MULTIVITAMIN) tablet Take 1 tablet by mouth 3 (three) times daily.     . nitroGLYCERIN (NITROSTAT) 0.4 MG SL tablet Place 1 tablet (0.4 mg total) under the tongue every 5 (five) minutes as needed. 25 tablet 3  . Omega-3 Fatty Acids (FISH OIL) 1200 MG CAPS Take 1,200 mg by mouth 2 (two) times daily.     . polyethylene glycol (MIRALAX / GLYCOLAX) packet Take 17 g by mouth daily as needed for mild constipation.     . triamterene-hydrochlorothiazide (MAXZIDE-25) 37.5-25 MG per tablet Take 1 tablet by mouth daily.       No current facility-administered medications for this visit.     Past Medical History:  Diagnosis Date  .  Atrial fibrillation ([place])    a. dx 10/2017. b. s/p DCCV on 11/19/2017 with successful conversion to NSR but back in AFIB 3 weeks later  . CAD (coronary artery disease)    a. s/p PCI to LAD 2001. b. s/p stenting to the RCA and mid LAD December 2011, residual distal RCA disease treated medically. b. nuc 02/2013 abnormal with fixed defect seen in inferoapical, mid-inferior, and basal inferior walls, indicative of myocardial scar, no evidence of ischemia, EF 41% (Ef 55-60% by echo same time).  . Carotid artery disease ([place])    carotid Doppler course left side 5069% stenosis  . Dysrhythmia    AFib  . Habitual alcohol use   . Hyperlipidemia   . Hypertension   . Low back pain   . OSA on CPAP   . Osteoarthritis   . Polysubstance  abuse ([place])    use including marijuana and vicodin,which he obtained from the street  . PVC's (premature ventricular contractions)    status post Holter monitor normal sinus rhythm otherwise asymptomatic PVCs.  . Seasonal allergies     Past Surgical History:  Procedure Laterality Date  . CARDIOVERSION N/A 11/19/2017   Procedure: CARDIOVERSION;  Surgeon: Herminio Commons, MD;  Location: [facility];  Service: Cardiovascular;  Laterality: N/A;  . CORONARY ANGIOPLASTY WITH STENT PLACEMENT  2011  . TONSILLECTOMY      Social History   Socioeconomic History  . Marital status: Married    Spouse name: Not on file  . Number of children: Not on file  . Years of education: Not on file  . Highest education level: Not on file  Occupational History    Employer: UNEMPLOYED    Comment: works Social worker employed  Social Needs  . Financial resource strain: Not on file  . Food insecurity:    Worry: Not on file    Inability: Not on file  . Transportation needs:    Medical: Not on file    Non-medical: Not on file  Tobacco Use  . Smoking status: Former Smoker    Packs/day: 1.00    Years: 35.00    Pack years: 35.00    Types: Cigarettes    Start date: 09/18/1967    Last attempt to quit: 06/09/1999    Years since quitting: 18.9  . Smokeless tobacco: Never Used  Substance and Sexual Activity  . Alcohol use: Yes    Alcohol/week: 42.0 standard drinks    Types: 42 Shots of liquor per week    Comment: 1/2 gallon og liquor per week  . Drug use: Yes    Types: Marijuana    Comment: smokes marijuana occassionally-last smoked few weeks ago  . Sexual activity: Yes    Birth control/protection: None  Lifestyle  . Physical activity:    Days per week: Not on file    Minutes per session: Not on file  . Stress: Not on file  Relationships  . Social connections:    Talks on phone: Not on file    Gets together: Not on file    Attends religious service: Not on file    Active member of club  or organization: Not on file    Attends meetings of clubs or organizations: Not on file    Relationship status: Not on file  . Intimate partner violence:    Fear of current or ex partner: Not on file    Emotionally abused: Not on file    Physically abused: Not on file  Forced sexual activity: Not on file  Other Topics Concern  . Not on file  Social History Narrative   No exercise     Vitals:   05/10/18 1605  BP: (!) 152/82  Pulse: (!) 56  SpO2: 97%  Weight: 273 lb (123.8 kg)  Height: 5\' 8"  (1.727 m)    Wt Readings from Last 3 Encounters:  05/10/18 273 lb (123.8 kg)  03/01/18 270 lb (122.5 kg)  01/17/18 272 lb 6.4 oz (123.6 kg)     PHYSICAL EXAM General: NAD HEENT: Normal. Neck: No JVD, no thyromegaly. Lungs: Clear to auscultation bilaterally with normal respiratory effort. CV: Regular rate and rhythm, normal S1/S2, no S3/S4, no murmur. No pretibial or periankle edema.   Abdomen: Soft, obese.  Neurologic: Alert and oriented.  Psych: Normal affect. Skin: Normal. Musculoskeletal: No gross deformities.    ECG: Reviewed above under Subjective   Labs: Lab Results  Component Value Date/Time   K 3.5 03/01/2018 05:00 PM   BUN 24 (H) 03/01/2018 05:00 PM   CREATININE 0.90 04/18/2018 04:49 PM   ALT 35 05/20/2010 01:48 PM   TSH 2.546 05/20/2010 07:38 PM   HGB 14.4 03/01/2018 05:00 PM     Lipids: Lab Results  Component Value Date/Time   LDLCALC  05/21/2010 05:30 AM    72        Total Cholesterol/HDL:CHD Risk Coronary Heart Disease Risk Table                     Men   Women  1/2 Average Risk   3.4   3.3  Average Risk       5.0   4.4  2 X Average Risk   9.6   7.1  3 X Average Risk  23.4   11.0        Use the calculated Patient Ratio above and the CHD Risk Table to determine the patient's CHD Risk.        ATP III CLASSIFICATION (LDL):  <100     mg/dL   Optimal  100-129  mg/dL   Near or Above                    Optimal  130-159  mg/dL   Borderline   160-189  mg/dL   High  >190     mg/dL   Very High   CHOL  05/21/2010 05:30 AM    168        ATP III CLASSIFICATION:  <200     mg/dL   Desirable  200-239  mg/dL   Borderline High  >=240    mg/dL   High          TRIG 291 (H) 05/21/2010 05:30 AM   HDL 38 (L) 05/21/2010 05:30 AM       ASSESSMENT AND PLAN: 1.  Persistent atrial fibrillation: Currently in a regular rhythm.  Remains on long-acting diltiazem 120 mg daily as recommended by EP.  Anticoagulated with Eliquis.  2.  Coronary artery disease: Symptomatically stable.  Previous stenting to the RCA and LAD as noted above.  Continue statin therapy.  No aspirin given need for systemic anticoagulation.  3.  Hypertension: Blood pressure is elevated today.  He is on lisinopril 40 mg and long-acting diltiazem 120 mg along with triamterene-hydrochlorothiazide 37.5-25 mg.  This will need further monitoring as he may need further adjustments.  He is morbidly obese and certainly needs weight loss.  We talked about this.  4.  Hyperlipidemia: Continue atorvastatin 80 mg.  LDL was 68 in April 2019 and at goal.  5.  Obesity: We talked about the importance of dietary modification and exercise for weight loss.   Disposition: Follow up 6 months   Kate Sable, M.D., F.A.C.C.

## 2018-05-25 ENCOUNTER — Encounter: Payer: Self-pay | Admitting: Adult Health

## 2018-05-25 ENCOUNTER — Ambulatory Visit (INDEPENDENT_AMBULATORY_CARE_PROVIDER_SITE_OTHER): Payer: BLUE CROSS/BLUE SHIELD | Admitting: Adult Health

## 2018-05-25 DIAGNOSIS — I10 Essential (primary) hypertension: Secondary | ICD-10-CM | POA: Diagnosis not present

## 2018-05-25 DIAGNOSIS — G4733 Obstructive sleep apnea (adult) (pediatric): Secondary | ICD-10-CM | POA: Diagnosis not present

## 2018-05-25 DIAGNOSIS — Z9989 Dependence on other enabling machines and devices: Secondary | ICD-10-CM | POA: Diagnosis not present

## 2018-05-25 NOTE — Assessment & Plan Note (Signed)
Wt loss  

## 2018-05-25 NOTE — Assessment & Plan Note (Signed)
Well-controlled on CPAP with excellent control and compliance  Plan  Patient Instructions  Follow up with PCP for elevated blood pressure .  Continue on CPAP at bedtime Keep up the good work Work on [employer] Do not drive if sleepy Follow-up with Dr. [name] in 1 year and as needed

## 2018-05-25 NOTE — Assessment & Plan Note (Addendum)
Not optimally controlled.  Patient is asymptomatic.   Patient is to follow-up primary care physician for further evaluation and monitoring.

## 2018-05-25 NOTE — Progress Notes (Signed)
@Patient  ID: Martin [name], male    DOB: January 05, 1956, 62 y.o.   MRN: [id number]  Chief Complaint  Patient presents with  . Follow-up    OSA     Referring provider: Deloria Lair., MD  HPI: 62year-old obese man with CAD for FU of obstructive sleep apnea. He had asleep study in 2001 when he had a heart attack and has been maintained on CPAP since then.  TEST/EVENTS :   PSG 04/2016 at [facility] showed AHI of 29/hour with lowest desaturation of 86%   05/25/2018 Follow up: OSA  Patient presents for a one-year follow-up.  Patient has underlying sleep apnea.  He is on CPAP at bedtime.  Patient says he feels rested on CPAP.  Says he cannot go a night without wearing his CPAP.  Patient has no significant daytime sleepiness.  Download shows excellent compliance with average usage at 7.5 hours.  Patient is on CPAP 12 cm H2O.  AHI 0.9.   Allergies  Allergen Reactions  . Penicillins Hives and Rash    Has patient had a PCN reaction causing immediate rash, facial/tongue/throat swelling, SOB or lightheadedness with hypotension: Unknown Has patient had a PCN reaction causing severe rash involving mucus membranes or skin necrosis: Unknown Has patient had a PCN reaction that required hospitalization: No Has patient had a PCN reaction occurring within the last 10 years: No If all of the above answers are "NO", then may proceed with Cephalosporin use.      Immunization History  Administered Date(s) Administered  . Influenza,inj,Quad PF,6+ Mos 02/11/2016  . Influenza-Unspecified 04/07/2017    Past Medical History:  Diagnosis Date  . Atrial fibrillation ([place])    a. dx 10/2017. b. s/p DCCV on 11/19/2017 with successful conversion to NSR but back in AFIB 3 weeks later  . CAD (coronary artery disease)    a. s/p PCI to LAD 2001. b. s/p stenting to the RCA and mid LAD December 2011, residual distal RCA disease treated medically. b. nuc 02/2013 abnormal with fixed defect seen in  inferoapical, mid-inferior, and basal inferior walls, indicative of myocardial scar, no evidence of ischemia, EF 41% (Ef 55-60% by echo same time).  . Carotid artery disease ([place])    carotid Doppler course left side 5069% stenosis  . Dysrhythmia    AFib  . Habitual alcohol use   . Hyperlipidemia   . Hypertension   . Low back pain   . OSA on CPAP   . Osteoarthritis   . Polysubstance abuse ([place])    use including marijuana and vicodin,which he obtained from the street  . PVC's (premature ventricular contractions)    status post Holter monitor normal sinus rhythm otherwise asymptomatic PVCs.  . Seasonal allergies     Tobacco History: Social History   Tobacco Use  Smoking Status Former Smoker  . Packs/day: 1.00  . Years: 35.00  . Pack years: 35.00  . Types: Cigarettes  . Start date: 09/18/1967  . Last attempt to quit: 06/09/1999  . Years since quitting: 18.9  Smokeless Tobacco Never Used   Counseling given: Not Answered   Outpatient Medications Prior to Visit  Medication Sig Dispense Refill  . allopurinol (ZYLOPRIM) 300 MG tablet Take 300 mg by mouth daily.    Marland Kitchen apixaban (ELIQUIS) 5 MG TABS tablet Take 1 tablet (5 mg total) by mouth 2 (two) times daily. 60 tablet 11  . atorvastatin (LIPITOR) 80 MG tablet TAKE 1 TABLET BY MOUTH EVERY DAY 90 tablet 2  .  Cholecalciferol (VITAMIN D-3) 1000 UNITS CAPS Take 1,000 Units by mouth daily.     . Cyanocobalamin (VITAMIN B-12) 2500 MCG SUBL Place 2,500 mcg under the tongue.    . diclofenac (VOLTAREN) 75 MG EC tablet Take 75 mg by mouth 2 (two) times daily.    Marland Kitchen diltiazem (CARDIZEM CD) 120 MG 24 hr capsule TAKE 1 CAPSULE BY MOUTH EVERY DAY 90 capsule 1  . fluticasone (FLONASE) 50 MCG/ACT nasal spray Place 2 sprays into the nose daily.      . folic acid (FOLVITE) 1 MG tablet TAKE ONE TABLET BY MOUTH EVERY DAY 30 tablet 6  . lisinopril (PRINIVIL,ZESTRIL) 40 MG tablet TAKE 1 TABLET BY MOUTH EVERY DAY 90 tablet 1  . loratadine (CLARITIN) 10 MG  tablet Take 10 mg by mouth daily.      . magnesium oxide (MAG-OX) 400 MG tablet Take 400 mg by mouth at bedtime.     . meclizine (ANTIVERT) 32 MG tablet Take 32 mg by mouth daily as needed for dizziness.     . Multiple Vitamin (MULTIVITAMIN) tablet Take 1 tablet by mouth 3 (three) times daily.     . nitroGLYCERIN (NITROSTAT) 0.4 MG SL tablet Place 1 tablet (0.4 mg total) under the tongue every 5 (five) minutes as needed. 25 tablet 3  . Omega-3 Fatty Acids (FISH OIL) 1200 MG CAPS Take 1,200 mg by mouth 2 (two) times daily.     . polyethylene glycol (MIRALAX / GLYCOLAX) packet Take 17 g by mouth daily as needed for mild constipation.     . triamterene-hydrochlorothiazide (MAXZIDE-25) 37.5-25 MG per tablet Take 1 tablet by mouth daily.       No facility-administered medications prior to visit.      Review of Systems  Constitutional:   No  weight loss, night sweats,  Fevers, chills, fatigue, or  lassitude.  HEENT:   No headaches,  Difficulty swallowing,  Tooth/dental problems, or  Sore throat,                No sneezing, itching, ear ache, nasal congestion, post nasal drip,   CV:  No chest pain,  Orthopnea, PND, swelling in lower extremities, anasarca, dizziness, palpitations, syncope.   GI  No heartburn, indigestion, abdominal pain, nausea, vomiting, diarrhea, change in bowel habits, loss of appetite, bloody stools.   Resp: No shortness of breath with exertion or at rest.  No excess mucus, no productive cough,  No non-productive cough,  No coughing up of blood.  No change in color of mucus.  No wheezing.  No chest wall deformity  Skin: no rash or lesions.  GU: no dysuria, change in color of urine, no urgency or frequency.  No flank pain, no hematuria   MS:  No joint pain or swelling.  No decreased range of motion.  No back pain.    Physical Exam  BP (!) 160/88 (BP Location: Right Arm, Cuff Size: Large)   Pulse 62   Ht 5' 7.5" (1.715 m)   Wt 271 lb 12.8 oz (123.3 kg)   SpO2 94%    BMI 41.94 kg/m   GEN: A/Ox3; pleasant , NAD, obese  HEENT:  [place]/AT,  EACs-clear, TMs-wnl, NOSE-clear, THROAT-clear, no lesions, no postnasal drip or exudate noted.  Class III MP airway  NECK:  Supple w/ fair ROM; no JVD; normal carotid impulses w/o bruits; no thyromegaly or nodules palpated; no lymphadenopathy.    RESP  Clear  P & A; w/o, wheezes/ rales/ or rhonchi. no  accessory muscle use, no dullness to percussion  CARD:  RRR, no m/r/g, no peripheral edema, pulses intact, no cyanosis or clubbing.  GI:   Soft & nt; nml bowel sounds; no organomegaly or masses detected.   Musco: Warm bil, no deformities or joint swelling noted.   Neuro: alert, no focal deficits noted.    Skin: Warm, no lesions or rashes    Lab Results:  CBC   BNP No results found for: BNP  ProBNP No results found for: PROBNP  Imaging: No results found.    No flowsheet data found.  No results found for: NITRICOXIDE      Assessment & Plan:   OSA on CPAP Well-controlled on CPAP with excellent control and compliance  Plan  Patient Instructions  Follow up with PCP for elevated blood pressure .  Continue on CPAP at bedtime Keep up the good work Work on [employer] Do not drive if sleepy Follow-up with Dr. [name] in 1 year and as needed      Obesity Wt loss   Essential hypertension Not optimally controlled.  Patient is asymptomatic.   Patient is to follow-up primary care physician for further evaluation and monitoring.     Rexene Edison, NP 05/25/2018

## 2018-05-25 NOTE — Progress Notes (Signed)
@Patient  ID: Martin [name], male    DOB: [birth year], 62 y.o.   MRN: [id number]  Chief Complaint  Patient presents with  . Follow-up    OSA     Referring provider: Deloria Lair., MD  HPI:   @Patient  ID: Martin [name], male    DOB: 1955-10-26, 62 y.o.   MRN: [id number]  Chief Complaint  Patient presents with  . Follow-up    OSA     Referring provider: Deloria Lair., MD  HPI:   TEST/EVENTS :   Allergies  Allergen Reactions  . Penicillins Hives and Rash    Has patient had a PCN reaction causing immediate rash, facial/tongue/throat swelling, SOB or lightheadedness with hypotension: Unknown Has patient had a PCN reaction causing severe rash involving mucus membranes or skin necrosis: Unknown Has patient had a PCN reaction that required hospitalization: No Has patient had a PCN reaction occurring within the last 10 years: No If all of the above answers are "NO", then may proceed with Cephalosporin use.      Immunization History  Administered Date(s) Administered  . Influenza,inj,Quad PF,6+ Mos 02/11/2016  . Influenza-Unspecified 04/07/2017    Past Medical History:  Diagnosis Date  . Atrial fibrillation ([place])    a. dx 10/2017. b. s/p DCCV on 11/19/2017 with successful conversion to NSR but back in AFIB 3 weeks later  . CAD (coronary artery disease)    a. s/p PCI to LAD 2001. b. s/p stenting to the RCA and mid LAD December 2011, residual distal RCA disease treated medically. b. nuc 02/2013 abnormal with fixed defect seen in inferoapical, mid-inferior, and basal inferior walls, indicative of myocardial scar, no evidence of ischemia, EF 41% (Ef 55-60% by echo same time).  . Carotid artery disease ([place])    carotid Doppler course left side 5069% stenosis  . Dysrhythmia    AFib  . Habitual alcohol use   . Hyperlipidemia   . Hypertension   . Low back pain   . OSA on CPAP   . Osteoarthritis   . Polysubstance abuse ([place])    use including marijuana and  vicodin,which he obtained from the street  . PVC's (premature ventricular contractions)    status post Holter monitor normal sinus rhythm otherwise asymptomatic PVCs.  . Seasonal allergies     Tobacco History: Social History   Tobacco Use  Smoking Status Former Smoker  . Packs/day: 1.00  . Years: 35.00  . Pack years: 35.00  . Types: Cigarettes  . Start date: 09/18/1967  . Last attempt to quit: 06/09/1999  . Years since quitting: 18.9  Smokeless Tobacco Never Used   Counseling given: Not Answered   Outpatient Medications Prior to Visit  Medication Sig Dispense Refill  . allopurinol (ZYLOPRIM) 300 MG tablet Take 300 mg by mouth daily.    Marland Kitchen apixaban (ELIQUIS) 5 MG TABS tablet Take 1 tablet (5 mg total) by mouth 2 (two) times daily. 60 tablet 11  . atorvastatin (LIPITOR) 80 MG tablet TAKE 1 TABLET BY MOUTH EVERY DAY 90 tablet 2  . Cholecalciferol (VITAMIN D-3) 1000 UNITS CAPS Take 1,000 Units by mouth daily.     . Cyanocobalamin (VITAMIN B-12) 2500 MCG SUBL Place 2,500 mcg under the tongue.    . diclofenac (VOLTAREN) 75 MG EC tablet Take 75 mg by mouth 2 (two) times daily.    Marland Kitchen diltiazem (CARDIZEM CD) 120 MG 24 hr capsule TAKE 1 CAPSULE BY MOUTH EVERY DAY 90 capsule 1  . fluticasone (FLONASE)  50 MCG/ACT nasal spray Place 2 sprays into the nose daily.      . folic acid (FOLVITE) 1 MG tablet TAKE ONE TABLET BY MOUTH EVERY DAY 30 tablet 6  . lisinopril (PRINIVIL,ZESTRIL) 40 MG tablet TAKE 1 TABLET BY MOUTH EVERY DAY 90 tablet 1  . loratadine (CLARITIN) 10 MG tablet Take 10 mg by mouth daily.      . magnesium oxide (MAG-OX) 400 MG tablet Take 400 mg by mouth at bedtime.     . meclizine (ANTIVERT) 32 MG tablet Take 32 mg by mouth daily as needed for dizziness.     . Multiple Vitamin (MULTIVITAMIN) tablet Take 1 tablet by mouth 3 (three) times daily.     . nitroGLYCERIN (NITROSTAT) 0.4 MG SL tablet Place 1 tablet (0.4 mg total) under the tongue every 5 (five) minutes as needed. 25 tablet 3    . Omega-3 Fatty Acids (FISH OIL) 1200 MG CAPS Take 1,200 mg by mouth 2 (two) times daily.     . polyethylene glycol (MIRALAX / GLYCOLAX) packet Take 17 g by mouth daily as needed for mild constipation.     . triamterene-hydrochlorothiazide (MAXZIDE-25) 37.5-25 MG per tablet Take 1 tablet by mouth daily.       No facility-administered medications prior to visit.      Review of Systems  Constitutional:   No  weight loss, night sweats,  Fevers, chills, fatigue, or  lassitude.  HEENT:   No headaches,  Difficulty swallowing,  Tooth/dental problems, or  Sore throat,                No sneezing, itching, ear ache, nasal congestion, post nasal drip,   CV:  No chest pain,  Orthopnea, PND, swelling in lower extremities, anasarca, dizziness, palpitations, syncope.   GI  No heartburn, indigestion, abdominal pain, nausea, vomiting, diarrhea, change in bowel habits, loss of appetite, bloody stools.   Resp: No shortness of breath with exertion or at rest.  No excess mucus, no productive cough,  No non-productive cough,  No coughing up of blood.  No change in color of mucus.  No wheezing.  No chest wall deformity  Skin: no rash or lesions.  GU: no dysuria, change in color of urine, no urgency or frequency.  No flank pain, no hematuria   MS:  No joint pain or swelling.  No decreased range of motion.  No back pain.    Physical Exam  BP (!) 160/88 (BP Location: Right Arm, Cuff Size: Large)   Pulse 62   Ht 5' 7.5" (1.715 m)   Wt 271 lb 12.8 oz (123.3 kg)   SpO2 94%   BMI 41.94 kg/m   GEN: A/Ox3; pleasant , NAD, well nourished    HEENT:  [place]/AT,  EACs-clear, TMs-wnl, NOSE-clear, THROAT-clear, no lesions, no postnasal drip or exudate noted.   NECK:  Supple w/ fair ROM; no JVD; normal carotid impulses w/o bruits; no thyromegaly or nodules palpated; no lymphadenopathy.    RESP  Clear  P & A; w/o, wheezes/ rales/ or rhonchi. no accessory muscle use, no dullness to percussion  CARD:  RRR, no  m/r/g, no peripheral edema, pulses intact, no cyanosis or clubbing.  GI:   Soft & nt; nml bowel sounds; no organomegaly or masses detected.   Musco: Warm bil, no deformities or joint swelling noted.   Neuro: alert, no focal deficits noted.    Skin: Warm, no lesions or rashes    Lab Results:  CBC  Component Value Date/Time   WBC 7.1 03/01/2018 1700   RBC 4.48 03/01/2018 1700   HGB 14.4 03/01/2018 1700   HCT 43.3 03/01/2018 1700   PLT 136 (L) 03/01/2018 1700   MCV 96.7 03/01/2018 1700   MCH 32.1 03/01/2018 1700   MCHC 33.3 03/01/2018 1700   RDW 13.4 03/01/2018 1700   LYMPHSABS 1.7 03/01/2018 1700   MONOABS 0.5 03/01/2018 1700   EOSABS 0.1 03/01/2018 1700   BASOSABS 0.0 03/01/2018 1700    BMET    Component Value Date/Time   NA 142 03/01/2018 1700   K 3.5 03/01/2018 1700   CL 109 03/01/2018 1700   CO2 25 03/01/2018 1700   GLUCOSE 136 (H) 03/01/2018 1700   BUN 24 (H) 03/01/2018 1700   CREATININE 0.90 04/18/2018 1649   CALCIUM 9.4 03/01/2018 1700   GFRNONAA >60 03/01/2018 1700   GFRAA >60 03/01/2018 1700    BNP No results found for: BNP  ProBNP No results found for: PROBNP  Imaging: No results found.    No flowsheet data found.  No results found for: NITRICOXIDE      Assessment & Plan:   No problem-specific Assessment & Plan notes found for this encounter.     Rexene Edison, NP 05/25/2018   @Patient  ID: Martin [name], male    DOB: 1956/01/24, 62 y.o.   MRN: [id number]  Chief Complaint  Patient presents with  . Follow-up    OSA     Referring provider: Deloria Lair., MD  HPI:   TEST/EVENTS :   Allergies  Allergen Reactions  . Penicillins Hives and Rash    Has patient had a PCN reaction causing immediate rash, facial/tongue/throat swelling, SOB or lightheadedness with hypotension: Unknown Has patient had a PCN reaction causing severe rash involving mucus membranes or skin necrosis: Unknown Has patient had a PCN reaction  that required hospitalization: No Has patient had a PCN reaction occurring within the last 10 years: No If all of the above answers are "NO", then may proceed with Cephalosporin use.      Immunization History  Administered Date(s) Administered  . Influenza,inj,Quad PF,6+ Mos 02/11/2016  . Influenza-Unspecified 04/07/2017    Past Medical History:  Diagnosis Date  . Atrial fibrillation ([place])    a. dx 10/2017. b. s/p DCCV on 11/19/2017 with successful conversion to NSR but back in AFIB 3 weeks later  . CAD (coronary artery disease)    a. s/p PCI to LAD 2001. b. s/p stenting to the RCA and mid LAD December 2011, residual distal RCA disease treated medically. b. nuc 02/2013 abnormal with fixed defect seen in inferoapical, mid-inferior, and basal inferior walls, indicative of myocardial scar, no evidence of ischemia, EF 41% (Ef 55-60% by echo same time).  . Carotid artery disease ([place])    carotid Doppler course left side 5069% stenosis  . Dysrhythmia    AFib  . Habitual alcohol use   . Hyperlipidemia   . Hypertension   . Low back pain   . OSA on CPAP   . Osteoarthritis   . Polysubstance abuse ([place])    use including marijuana and vicodin,which he obtained from the street  . PVC's (premature ventricular contractions)    status post Holter monitor normal sinus rhythm otherwise asymptomatic PVCs.  . Seasonal allergies     Tobacco History: Social History   Tobacco Use  Smoking Status Former Smoker  . Packs/day: 1.00  . Years: 35.00  . Pack years: 35.00  . Types: Cigarettes  .  Start date: 09/18/1967  . Last attempt to quit: 06/09/1999  . Years since quitting: 18.9  Smokeless Tobacco Never Used   Counseling given: Not Answered   Outpatient Medications Prior to Visit  Medication Sig Dispense Refill  . allopurinol (ZYLOPRIM) 300 MG tablet Take 300 mg by mouth daily.    Marland Kitchen apixaban (ELIQUIS) 5 MG TABS tablet Take 1 tablet (5 mg total) by mouth 2 (two) times daily. 60 tablet 11  .  atorvastatin (LIPITOR) 80 MG tablet TAKE 1 TABLET BY MOUTH EVERY DAY 90 tablet 2  . Cholecalciferol (VITAMIN D-3) 1000 UNITS CAPS Take 1,000 Units by mouth daily.     . Cyanocobalamin (VITAMIN B-12) 2500 MCG SUBL Place 2,500 mcg under the tongue.    . diclofenac (VOLTAREN) 75 MG EC tablet Take 75 mg by mouth 2 (two) times daily.    Marland Kitchen diltiazem (CARDIZEM CD) 120 MG 24 hr capsule TAKE 1 CAPSULE BY MOUTH EVERY DAY 90 capsule 1  . fluticasone (FLONASE) 50 MCG/ACT nasal spray Place 2 sprays into the nose daily.      . folic acid (FOLVITE) 1 MG tablet TAKE ONE TABLET BY MOUTH EVERY DAY 30 tablet 6  . lisinopril (PRINIVIL,ZESTRIL) 40 MG tablet TAKE 1 TABLET BY MOUTH EVERY DAY 90 tablet 1  . loratadine (CLARITIN) 10 MG tablet Take 10 mg by mouth daily.      . magnesium oxide (MAG-OX) 400 MG tablet Take 400 mg by mouth at bedtime.     . meclizine (ANTIVERT) 32 MG tablet Take 32 mg by mouth daily as needed for dizziness.     . Multiple Vitamin (MULTIVITAMIN) tablet Take 1 tablet by mouth 3 (three) times daily.     . nitroGLYCERIN (NITROSTAT) 0.4 MG SL tablet Place 1 tablet (0.4 mg total) under the tongue every 5 (five) minutes as needed. 25 tablet 3  . Omega-3 Fatty Acids (FISH OIL) 1200 MG CAPS Take 1,200 mg by mouth 2 (two) times daily.     . polyethylene glycol (MIRALAX / GLYCOLAX) packet Take 17 g by mouth daily as needed for mild constipation.     . triamterene-hydrochlorothiazide (MAXZIDE-25) 37.5-25 MG per tablet Take 1 tablet by mouth daily.       No facility-administered medications prior to visit.      Review of Systems  Constitutional:   No  weight loss, night sweats,  Fevers, chills, fatigue, or  lassitude.  HEENT:   No headaches,  Difficulty swallowing,  Tooth/dental problems, or  Sore throat,                No sneezing, itching, ear ache, nasal congestion, post nasal drip,   CV:  No chest pain,  Orthopnea, PND, swelling in lower extremities, anasarca, dizziness, palpitations, syncope.    GI  No heartburn, indigestion, abdominal pain, nausea, vomiting, diarrhea, change in bowel habits, loss of appetite, bloody stools.   Resp: No shortness of breath with exertion or at rest.  No excess mucus, no productive cough,  No non-productive cough,  No coughing up of blood.  No change in color of mucus.  No wheezing.  No chest wall deformity  Skin: no rash or lesions.  GU: no dysuria, change in color of urine, no urgency or frequency.  No flank pain, no hematuria   MS:  No joint pain or swelling.  No decreased range of motion.  No back pain.    Physical Exam  BP (!) 160/88 (BP Location: Right Arm, Cuff Size: Large)  Pulse 62   Ht 5' 7.5" (1.715 m)   Wt 271 lb 12.8 oz (123.3 kg)   SpO2 94%   BMI 41.94 kg/m   GEN: A/Ox3; pleasant , NAD, well nourished    HEENT:  [place]/AT,  EACs-clear, TMs-wnl, NOSE-clear, THROAT-clear, no lesions, no postnasal drip or exudate noted.   NECK:  Supple w/ fair ROM; no JVD; normal carotid impulses w/o bruits; no thyromegaly or nodules palpated; no lymphadenopathy.    RESP  Clear  P & A; w/o, wheezes/ rales/ or rhonchi. no accessory muscle use, no dullness to percussion  CARD:  RRR, no m/r/g, no peripheral edema, pulses intact, no cyanosis or clubbing.  GI:   Soft & nt; nml bowel sounds; no organomegaly or masses detected.   Musco: Warm bil, no deformities or joint swelling noted.   Neuro: alert, no focal deficits noted.    Skin: Warm, no lesions or rashes    Lab Results:  CBC    Component Value Date/Time   WBC 7.1 03/01/2018 1700   RBC 4.48 03/01/2018 1700   HGB 14.4 03/01/2018 1700   HCT 43.3 03/01/2018 1700   PLT 136 (L) 03/01/2018 1700   MCV 96.7 03/01/2018 1700   MCH 32.1 03/01/2018 1700   MCHC 33.3 03/01/2018 1700   RDW 13.4 03/01/2018 1700   LYMPHSABS 1.7 03/01/2018 1700   MONOABS 0.5 03/01/2018 1700   EOSABS 0.1 03/01/2018 1700   BASOSABS 0.0 03/01/2018 1700    BMET    Component Value Date/Time   NA 142  03/01/2018 1700   K 3.5 03/01/2018 1700   CL 109 03/01/2018 1700   CO2 25 03/01/2018 1700   GLUCOSE 136 (H) 03/01/2018 1700   BUN 24 (H) 03/01/2018 1700   CREATININE 0.90 04/18/2018 1649   CALCIUM 9.4 03/01/2018 1700   GFRNONAA >60 03/01/2018 1700   GFRAA >60 03/01/2018 1700    BNP No results found for: BNP  ProBNP No results found for: PROBNP  Imaging: No results found.    No flowsheet data found.  No results found for: NITRICOXIDE      Assessment & Plan:   No problem-specific Assessment & Plan notes found for this encounter.     Rexene Edison, NP 05/25/2018   TEST/EVENTS :   Allergies  Allergen Reactions  . Penicillins Hives and Rash    Has patient had a PCN reaction causing immediate rash, facial/tongue/throat swelling, SOB or lightheadedness with hypotension: Unknown Has patient had a PCN reaction causing severe rash involving mucus membranes or skin necrosis: Unknown Has patient had a PCN reaction that required hospitalization: No Has patient had a PCN reaction occurring within the last 10 years: No If all of the above answers are "NO", then may proceed with Cephalosporin use.      Immunization History  Administered Date(s) Administered  . Influenza,inj,Quad PF,6+ Mos 02/11/2016  . Influenza-Unspecified 04/07/2017    Past Medical History:  Diagnosis Date  . Atrial fibrillation ([place])    a. dx 10/2017. b. s/p DCCV on 11/19/2017 with successful conversion to NSR but back in AFIB 3 weeks later  . CAD (coronary artery disease)    a. s/p PCI to LAD 2001. b. s/p stenting to the RCA and mid LAD December 2011, residual distal RCA disease treated medically. b. nuc 02/2013 abnormal with fixed defect seen in inferoapical, mid-inferior, and basal inferior walls, indicative of myocardial scar, no evidence of ischemia, EF 41% (Ef 55-60% by echo same time).  . Carotid artery disease (  [place])    carotid Doppler course left side 5069% stenosis  . Dysrhythmia     AFib  . Habitual alcohol use   . Hyperlipidemia   . Hypertension   . Low back pain   . OSA on CPAP   . Osteoarthritis   . Polysubstance abuse ([place])    use including marijuana and vicodin,which he obtained from the street  . PVC's (premature ventricular contractions)    status post Holter monitor normal sinus rhythm otherwise asymptomatic PVCs.  . Seasonal allergies     Tobacco History: Social History   Tobacco Use  Smoking Status Former Smoker  . Packs/day: 1.00  . Years: 35.00  . Pack years: 35.00  . Types: Cigarettes  . Start date: 09/18/1967  . Last attempt to quit: 06/09/1999  . Years since quitting: 18.9  Smokeless Tobacco Never Used   Counseling given: Not Answered   Outpatient Medications Prior to Visit  Medication Sig Dispense Refill  . allopurinol (ZYLOPRIM) 300 MG tablet Take 300 mg by mouth daily.    Marland Kitchen apixaban (ELIQUIS) 5 MG TABS tablet Take 1 tablet (5 mg total) by mouth 2 (two) times daily. 60 tablet 11  . atorvastatin (LIPITOR) 80 MG tablet TAKE 1 TABLET BY MOUTH EVERY DAY 90 tablet 2  . Cholecalciferol (VITAMIN D-3) 1000 UNITS CAPS Take 1,000 Units by mouth daily.     . Cyanocobalamin (VITAMIN B-12) 2500 MCG SUBL Place 2,500 mcg under the tongue.    . diclofenac (VOLTAREN) 75 MG EC tablet Take 75 mg by mouth 2 (two) times daily.    Marland Kitchen diltiazem (CARDIZEM CD) 120 MG 24 hr capsule TAKE 1 CAPSULE BY MOUTH EVERY DAY 90 capsule 1  . fluticasone (FLONASE) 50 MCG/ACT nasal spray Place 2 sprays into the nose daily.      . folic acid (FOLVITE) 1 MG tablet TAKE ONE TABLET BY MOUTH EVERY DAY 30 tablet 6  . lisinopril (PRINIVIL,ZESTRIL) 40 MG tablet TAKE 1 TABLET BY MOUTH EVERY DAY 90 tablet 1  . loratadine (CLARITIN) 10 MG tablet Take 10 mg by mouth daily.      . magnesium oxide (MAG-OX) 400 MG tablet Take 400 mg by mouth at bedtime.     . meclizine (ANTIVERT) 32 MG tablet Take 32 mg by mouth daily as needed for dizziness.     . Multiple Vitamin (MULTIVITAMIN) tablet  Take 1 tablet by mouth 3 (three) times daily.     . nitroGLYCERIN (NITROSTAT) 0.4 MG SL tablet Place 1 tablet (0.4 mg total) under the tongue every 5 (five) minutes as needed. 25 tablet 3  . Omega-3 Fatty Acids (FISH OIL) 1200 MG CAPS Take 1,200 mg by mouth 2 (two) times daily.     . polyethylene glycol (MIRALAX / GLYCOLAX) packet Take 17 g by mouth daily as needed for mild constipation.     . triamterene-hydrochlorothiazide (MAXZIDE-25) 37.5-25 MG per tablet Take 1 tablet by mouth daily.       No facility-administered medications prior to visit.      Review of Systems  Constitutional:   No  weight loss, night sweats,  Fevers, chills, fatigue, or  lassitude.  HEENT:   No headaches,  Difficulty swallowing,  Tooth/dental problems, or  Sore throat,                No sneezing, itching, ear ache, nasal congestion, post nasal drip,   CV:  No chest pain,  Orthopnea, PND, swelling in lower extremities, anasarca, dizziness, palpitations, syncope.  GI  No heartburn, indigestion, abdominal pain, nausea, vomiting, diarrhea, change in bowel habits, loss of appetite, bloody stools.   Resp: No shortness of breath with exertion or at rest.  No excess mucus, no productive cough,  No non-productive cough,  No coughing up of blood.  No change in color of mucus.  No wheezing.  No chest wall deformity  Skin: no rash or lesions.  GU: no dysuria, change in color of urine, no urgency or frequency.  No flank pain, no hematuria   MS:  No joint pain or swelling.  No decreased range of motion.  No back pain.    Physical Exam  BP (!) 160/88 (BP Location: Right Arm, Cuff Size: Large)   Pulse 62   Ht 5' 7.5" (1.715 m)   Wt 271 lb 12.8 oz (123.3 kg)   SpO2 94%   BMI 41.94 kg/m   GEN: A/Ox3; pleasant , NAD, well nourished    HEENT:  [place]/AT,  EACs-clear, TMs-wnl, NOSE-clear, THROAT-clear, no lesions, no postnasal drip or exudate noted.   NECK:  Supple w/ fair ROM; no JVD; normal carotid impulses w/o  bruits; no thyromegaly or nodules palpated; no lymphadenopathy.    RESP  Clear  P & A; w/o, wheezes/ rales/ or rhonchi. no accessory muscle use, no dullness to percussion  CARD:  RRR, no m/r/g, no peripheral edema, pulses intact, no cyanosis or clubbing.  GI:   Soft & nt; nml bowel sounds; no organomegaly or masses detected.   Musco: Warm bil, no deformities or joint swelling noted.   Neuro: alert, no focal deficits noted.    Skin: Warm, no lesions or rashes    Lab Results:  CBC    Component Value Date/Time   WBC 7.1 03/01/2018 1700   RBC 4.48 03/01/2018 1700   HGB 14.4 03/01/2018 1700   HCT 43.3 03/01/2018 1700   PLT 136 (L) 03/01/2018 1700   MCV 96.7 03/01/2018 1700   MCH 32.1 03/01/2018 1700   MCHC 33.3 03/01/2018 1700   RDW 13.4 03/01/2018 1700   LYMPHSABS 1.7 03/01/2018 1700   MONOABS 0.5 03/01/2018 1700   EOSABS 0.1 03/01/2018 1700   BASOSABS 0.0 03/01/2018 1700    BMET    Component Value Date/Time   NA 142 03/01/2018 1700   K 3.5 03/01/2018 1700   CL 109 03/01/2018 1700   CO2 25 03/01/2018 1700   GLUCOSE 136 (H) 03/01/2018 1700   BUN 24 (H) 03/01/2018 1700   CREATININE 0.90 04/18/2018 1649   CALCIUM 9.4 03/01/2018 1700   GFRNONAA >60 03/01/2018 1700   GFRAA >60 03/01/2018 1700    BNP No results found for: BNP  ProBNP No results found for: PROBNP  Imaging: No results found.    No flowsheet data found.  No results found for: NITRICOXIDE      Assessment & Plan:   No problem-specific Assessment & Plan notes found for this encounter.     Rexene Edison, NP 05/25/2018

## 2018-05-25 NOTE — Patient Instructions (Addendum)
Follow up with PCP for elevated blood pressure .  Continue on CPAP at bedtime Keep up the good work Work on [employer] Do not drive if sleepy Follow-up with Dr. [name] in 1 year and as needed

## 2018-06-08 DIAGNOSIS — I639 Cerebral infarction, unspecified: Secondary | ICD-10-CM

## 2018-06-08 DIAGNOSIS — I219 Acute myocardial infarction, unspecified: Secondary | ICD-10-CM

## 2018-06-08 HISTORY — DX: Acute myocardial infarction, unspecified: I21.9

## 2018-06-08 HISTORY — DX: Cerebral infarction, unspecified: I63.9

## 2018-06-13 ENCOUNTER — Ambulatory Visit (INDEPENDENT_AMBULATORY_CARE_PROVIDER_SITE_OTHER): Payer: PRIVATE HEALTH INSURANCE | Admitting: Otolaryngology

## 2018-06-13 DIAGNOSIS — H903 Sensorineural hearing loss, bilateral: Secondary | ICD-10-CM

## 2018-07-26 ENCOUNTER — Encounter: Payer: Self-pay | Admitting: Internal Medicine

## 2018-07-26 ENCOUNTER — Ambulatory Visit: Payer: PRIVATE HEALTH INSURANCE | Admitting: Internal Medicine

## 2018-07-26 VITALS — BP 144/80 | HR 54 | Ht 68.0 in | Wt 269.0 lb

## 2018-07-26 DIAGNOSIS — I4819 Other persistent atrial fibrillation: Secondary | ICD-10-CM | POA: Diagnosis not present

## 2018-07-26 NOTE — Patient Instructions (Signed)
Medication Instructions:  Your physician recommends that you continue on your current medications as directed. Please refer to the Current Medication list given to you today.  If you need a refill on your cardiac medications before your next appointment, please call your pharmacy.   Lab work: NONE  If you have labs (blood work) drawn today and your tests are completely normal, you will receive your results only by: . MyChart Message (if you have MyChart) OR . A paper copy in the mail If you have any lab test that is abnormal or we need to change your treatment, we will call you to review the results.  Testing/Procedures: NONE   Follow-Up: At [facility], you and your health needs are our priority.  As part of our continuing mission to provide you with exceptional heart care, we have created designated Provider Care Teams.  These Care Teams include your primary Cardiologist (physician) and Advanced Practice Providers (APPs -  Physician Assistants and Nurse Practitioners) who all work together to provide you with the care you need, when you need it. You will need a follow up appointment in 1 years.  Please call our office 2 months in advance to schedule this appointment.  You may see Suresh Koneswaran, MD or one of the following Advanced Practice Providers on your designated Care Team:   Brittany Strader, PA-C ([place] Office) . Michele Lenze, PA-C ([place] Office)  Any Other Special Instructions Will Be Listed Below (If Applicable). Thank you for choosing [facility]!     

## 2018-07-26 NOTE — Progress Notes (Signed)
HPI [name] returns today for ongoing evalution of atria fib. He has a h/o weakness and dizziness. His EF was preserved. He was cardioverted in June. In July, he was back in atrial fib with a controlled VR. Over the past few months he has felt a little bit better. No syncope. He did not know for sure that he has gone back to rhythm.  No chest pain.  Allergies  Allergen Reactions  . Penicillins Hives and Rash    Has patient had a PCN reaction causing immediate rash, facial/tongue/throat swelling, SOB or lightheadedness with hypotension: Unknown Has patient had a PCN reaction causing severe rash involving mucus membranes or skin necrosis: Unknown Has patient had a PCN reaction that required hospitalization: No Has patient had a PCN reaction occurring within the last 10 years: No If all of the above answers are "NO", then may proceed with Cephalosporin use.       Current Outpatient Medications  Medication Sig Dispense Refill  . allopurinol (ZYLOPRIM) 300 MG tablet Take 300 mg by mouth daily.    Marland Kitchen apixaban (ELIQUIS) 5 MG TABS tablet Take 1 tablet (5 mg total) by mouth 2 (two) times daily. 60 tablet 11  . atorvastatin (LIPITOR) 80 MG tablet TAKE 1 TABLET BY MOUTH EVERY DAY 90 tablet 2  . Cholecalciferol (VITAMIN D-3) 1000 UNITS CAPS Take 1,000 Units by mouth daily.     . Cyanocobalamin (VITAMIN B-12) 2500 MCG SUBL Place 2,500 mcg under the tongue.    . diclofenac (VOLTAREN) 75 MG EC tablet Take 75 mg by mouth 2 (two) times daily.    Marland Kitchen diltiazem (CARDIZEM CD) 120 MG 24 hr capsule TAKE 1 CAPSULE BY MOUTH EVERY DAY 90 capsule 1  . fluticasone (FLONASE) 50 MCG/ACT nasal spray Place 2 sprays into the nose daily.      . folic acid (FOLVITE) 1 MG tablet TAKE ONE TABLET BY MOUTH EVERY DAY 30 tablet 6  . lisinopril (PRINIVIL,ZESTRIL) 40 MG tablet TAKE 1 TABLET BY MOUTH EVERY DAY 90 tablet 1  . loratadine (CLARITIN) 10 MG tablet Take 10 mg by mouth daily.      . magnesium oxide (MAG-OX)  400 MG tablet Take 400 mg by mouth at bedtime.     . meclizine (ANTIVERT) 32 MG tablet Take 32 mg by mouth daily as needed for dizziness.     . Multiple Vitamin (MULTIVITAMIN) tablet Take 1 tablet by mouth 3 (three) times daily.     . nitroGLYCERIN (NITROSTAT) 0.4 MG SL tablet Place 1 tablet (0.4 mg total) under the tongue every 5 (five) minutes as needed. 25 tablet 3  . Omega-3 Fatty Acids (FISH OIL) 1200 MG CAPS Take 1,200 mg by mouth 2 (two) times daily.     . polyethylene glycol (MIRALAX / GLYCOLAX) packet Take 17 g by mouth daily as needed for mild constipation.     . triamterene-hydrochlorothiazide (MAXZIDE-25) 37.5-25 MG per tablet Take 1 tablet by mouth daily.       No current facility-administered medications for this visit.      Past Medical History:  Diagnosis Date  . Atrial fibrillation ([place])    a. dx 10/2017. b. s/p DCCV on 11/19/2017 with successful conversion to NSR but back in AFIB 3 weeks later  . CAD (coronary artery disease)    a. s/p PCI to LAD 2001. b. s/p stenting to the RCA and mid LAD December 2011, residual distal RCA disease treated medically. b. nuc 02/2013 abnormal with  fixed defect seen in inferoapical, mid-inferior, and basal inferior walls, indicative of myocardial scar, no evidence of ischemia, EF 41% (Ef 55-60% by echo same time).  . Carotid artery disease ([place])    carotid Doppler course left side 5069% stenosis  . Dysrhythmia    AFib  . Habitual alcohol use   . Hyperlipidemia   . Hypertension   . Low back pain   . OSA on CPAP   . Osteoarthritis   . Polysubstance abuse ([place])    use including marijuana and vicodin,which he obtained from the street  . PVC's (premature ventricular contractions)    status post Holter monitor normal sinus rhythm otherwise asymptomatic PVCs.  . Seasonal allergies     ROS:   All systems reviewed and negative except as noted in the HPI.   Past Surgical History:  Procedure Laterality Date  . CARDIOVERSION N/A 11/19/2017     Procedure: CARDIOVERSION;  Surgeon: Herminio Commons, MD;  Location: [facility];  Service: Cardiovascular;  Laterality: N/A;  . CORONARY ANGIOPLASTY WITH STENT PLACEMENT  2011  . TONSILLECTOMY       Family History  Problem Relation Age of Onset  . Stroke Mother   . Other Mother        small bowel obstruction  . Heart attack Father   . Heart attack Brother      Social History   Socioeconomic History  . Marital status: Married    Spouse name: Not on file  . Number of children: Not on file  . Years of education: Not on file  . Highest education level: Not on file  Occupational History    Employer: UNEMPLOYED    Comment: works Social worker employed  Social Needs  . Financial resource strain: Not on file  . Food insecurity:    Worry: Not on file    Inability: Not on file  . Transportation needs:    Medical: Not on file    Non-medical: Not on file  Tobacco Use  . Smoking status: Former Smoker    Packs/day: 1.00    Years: 35.00    Pack years: 35.00    Types: Cigarettes    Start date: 09/18/1967    Last attempt to quit: 06/09/1999    Years since quitting: 19.1  . Smokeless tobacco: Never Used  Substance and Sexual Activity  . Alcohol use: Yes    Alcohol/week: 42.0 standard drinks    Types: 42 Shots of liquor per week    Comment: 1/2 gallon og liquor per week  . Drug use: Yes    Types: Marijuana    Comment: smokes marijuana occassionally-last smoked few weeks ago  . Sexual activity: Yes    Birth control/protection: None  Lifestyle  . Physical activity:    Days per week: Not on file    Minutes per session: Not on file  . Stress: Not on file  Relationships  . Social connections:    Talks on phone: Not on file    Gets together: Not on file    Attends religious service: Not on file    Active member of club or organization: Not on file    Attends meetings of clubs or organizations: Not on file    Relationship status: Not on file  . Intimate partner  violence:    Fear of current or ex partner: Not on file    Emotionally abused: Not on file    Physically abused: Not on file    Forced sexual  activity: Not on file  Other Topics Concern  . Not on file  Social History Narrative   No exercise     BP (!) 144/80   Pulse (!) 54   Ht 5\' 8"  (1.727 m)   Wt 269 lb (122 kg)   SpO2 96%   BMI 40.90 kg/m   Physical Exam:  Well appearing NAD HEENT: Unremarkable Neck:  No JVD, no thyromegally Lymphatics:  No adenopathy Back:  No CVA tenderness Lungs:  Clear HEART:  Regular rate rhythm, no murmurs, no rubs, no clicks Abd:  soft, positive bowel sounds, no organomegally, no rebound, no guarding Ext:  2 plus pulses, no edema, no cyanosis, no clubbing Skin:  No rashes no nodules Neuro:  CN II through XII intact, motor grossly intact  EKG  DEVICE  Normal device function.  See PaceArt for details.   Assess/Plan: 1. Persistent atrial fib - he has had some palpitations but does not feel like his heart is racing. He does not have palpitations. Continue calcium channel blocker 2. HTN - his blood pressure is elevated. He is encouraged to lose weight and reduce his sodium intake. 3. CAD - he denies any anginal symptoms. He is encouraged to be more active.  Mikle Bosworth.D.

## 2018-07-27 NOTE — Telephone Encounter (Signed)
Tammy, this message was sent to you this morning.   Is a so-clean machine a good investment?   Message routed to Alvira Monday, NP

## 2018-07-29 NOTE — Telephone Encounter (Signed)
Per TP: we don't know that much about it but we've heard from patients that they like it.  However, they are not covered by insurance.  Thanks.

## 2018-08-01 ENCOUNTER — Encounter: Payer: Self-pay | Admitting: Internal Medicine

## 2018-08-23 ENCOUNTER — Other Ambulatory Visit: Payer: Self-pay | Admitting: Cardiovascular Disease

## 2018-09-22 ENCOUNTER — Other Ambulatory Visit: Payer: Self-pay | Admitting: Cardiovascular Disease

## 2018-09-22 MED ORDER — ATORVASTATIN CALCIUM 80 MG PO TABS: 80.0000 mg | ORAL_TABLET | Freq: Every day | ORAL | 2 refills | Status: DC

## 2018-09-22 NOTE — Telephone Encounter (Signed)
°*  STAT* If patient is at the pharmacy, call can be transferred to refill team.   1. Which medications need to be refilled? atorvastatin (LIPITOR) 80 MG tablet    2. Which pharmacy/location (including street and city if local pharmacy) is medication to be sent to? [facility]  3. Do they need a 30 day or 90 day supply? 90 x 2

## 2018-09-22 NOTE — Addendum Note (Signed)
Addended by: Levonne Hubert on: 09/22/2018 12:15 PM   Modules accepted: Orders

## 2018-11-09 ENCOUNTER — Other Ambulatory Visit: Payer: Self-pay

## 2018-11-09 MED ORDER — APIXABAN 5 MG PO TABS: 5.0000 mg | ORAL_TABLET | Freq: Two times a day (BID) | ORAL | 1 refills | Status: DC

## 2018-11-09 NOTE — Telephone Encounter (Signed)
apixaban (ELIQUIS) 5 MG TABS tablet  [facility]   [facility] [place]

## 2018-11-09 NOTE — Telephone Encounter (Signed)
Done

## 2018-11-30 ENCOUNTER — Telehealth: Payer: Self-pay | Admitting: Cardiovascular Disease

## 2018-11-30 NOTE — Telephone Encounter (Signed)
Virtual Visit Pre-Appointment Phone Call  "(Name), I am calling you today to discuss your upcoming appointment. We are currently trying to limit exposure to the virus that causes COVID-19 by seeing patients at home rather than in the office."  1. "What is the BEST phone number to call the day of the visit?" - include this in appointment notes  2. Do you have or have access to (through a family member/friend) a smartphone with video capability that we can use for your visit?" a. If yes - list this number in appt notes as cell (if different from BEST phone #) and list the appointment type as a VIDEO visit in appointment notes b. If no - list the appointment type as a PHONE visit in appointment notes  3. Confirm consent - "In the setting of the current Covid19 crisis, you are scheduled for a (phone or video) visit with your provider on (date) at (time).  Just as we do with many in-office visits, in order for you to participate in this visit, we must obtain consent.  If you'd like, I can send this to your mychart (if signed up) or email for you to review.  Otherwise, I can obtain your verbal consent now.  All virtual visits are billed to your insurance company just like a normal visit would be.  By agreeing to a virtual visit, we'd like you to understand that the technology does not allow for your provider to perform an examination, and thus may limit your provider's ability to fully assess your condition. If your provider identifies any concerns that need to be evaluated in person, we will make arrangements to do so.  Finally, though the technology is pretty good, we cannot assure that it will always work on either your or our end, and in the setting of a video visit, we may have to convert it to a phone-only visit.  In either situation, we cannot ensure that we have a secure connection.  Are you willing to proceed?" STAFF: Did the patient verbally acknowledge consent to telehealth visit? Document  YES/NO here: yes  4. Advise patient to be prepared - "Two hours prior to your appointment, go ahead and check your blood pressure, pulse, oxygen saturation, and your weight (if you have the equipment to check those) and write them all down. When your visit starts, your provider will ask you for this information. If you have an Apple Watch or Kardia device, please plan to have heart rate information ready on the day of your appointment. Please have a pen and paper handy nearby the day of the visit as well."  5. Give patient instructions for MyChart download to smartphone OR [email or web address] as below if video visit (depending on what platform provider is using)  6. Inform patient they will receive a phone call 15 minutes prior to their appointment time (may be from unknown caller ID) so they should be prepared to answer    [facility] has been deemed a candidate for a follow-up tele-health visit to limit community exposure during the Covid-19 pandemic. I spoke with the patient via phone to ensure availability of phone/video source, confirm preferred email & phone number, and discuss instructions and expectations.  I reminded Martin [name] to be prepared with any vital sign and/or heart rhythm information that could potentially be obtained via home monitoring, at the time of his visit. I reminded Martin [name] to expect a phone call prior to  his visit.  Martin [name] 11/30/2018 12:04 PM   INSTRUCTIONS FOR DOWNLOADING THE MYCHART APP TO SMARTPHONE  - The patient must first make sure to have activated MyChart and know their login information - If Apple, go to [employer] and type in MyChart in the search bar and download the app. If Android, ask patient to go to [employer] and type in [place] in the search bar and download the app. The app is free but as with any other app downloads, their phone may require them to verify saved payment information or  Apple/Android password.  - The patient will need to then log into the app with their MyChart username and password, and select [facility] as their healthcare provider to link the account. When it is time for your visit, go to the MyChart app, find appointments, and click Begin Video Visit. Be sure to Select Allow for your device to access the Microphone and Camera for your visit. You will then be connected, and your provider will be with you shortly.  **If they have any issues connecting, or need assistance please contact MyChart service desk [phone])**  **If using a computer, in order to ensure the best quality for their visit they will need to use either of the following Internet Browsers: [employer], or Google Chrome**  IF USING DOXIMITY or DOXY.ME - The patient will receive a link just prior to their visit by text.     FULL LENGTH CONSENT FOR TELE-HEALTH VISIT   I hereby voluntarily request, consent and authorize [facility] and its employed or contracted physicians, physician assistants, nurse practitioners or other licensed health care professionals (the Practitioner), to provide me with telemedicine health care services (the Services") as deemed necessary by the treating Practitioner. I acknowledge and consent to receive the Services by the Practitioner via telemedicine. I understand that the telemedicine visit will involve communicating with the Practitioner through live audiovisual communication technology and the disclosure of certain medical information by electronic transmission. I acknowledge that I have been given the opportunity to request an in-person assessment or other available alternative prior to the telemedicine visit and am voluntarily participating in the telemedicine visit.  I understand that I have the right to withhold or withdraw my consent to the use of telemedicine in the course of my care at any time, without affecting my right to future care  or treatment, and that the Practitioner or I may terminate the telemedicine visit at any time. I understand that I have the right to inspect all information obtained and/or recorded in the course of the telemedicine visit and may receive copies of available information for a reasonable fee.  I understand that some of the potential risks of receiving the Services via telemedicine include:   Delay or interruption in medical evaluation due to technological equipment failure or disruption;  Information transmitted may not be sufficient (e.g. poor resolution of images) to allow for appropriate medical decision making by the Practitioner; and/or   In rare instances, security protocols could fail, causing a breach of personal health information.  Furthermore, I acknowledge that it is my responsibility to provide information about my medical history, conditions and care that is complete and accurate to the best of my ability. I acknowledge that Practitioner's advice, recommendations, and/or decision may be based on factors not within their control, such as incomplete or inaccurate data provided by me or distortions of diagnostic images or specimens that may result from electronic transmissions. I  understand that the practice of medicine is not an exact science and that Practitioner makes no warranties or guarantees regarding treatment outcomes. I acknowledge that I will receive a copy of this consent concurrently upon execution via email to the email address I last provided but may also request a printed copy by calling the office of [facility].    I understand that my insurance will be billed for this visit.   I have read or had this consent read to me.  I understand the contents of this consent, which adequately explains the benefits and risks of the Services being provided via telemedicine.   I have been provided ample opportunity to ask questions regarding this consent and the Services and have had  my questions answered to my satisfaction.  I give my informed consent for the services to be provided through the use of telemedicine in my medical care  By participating in this telemedicine visit I agree to the above.

## 2018-12-05 DIAGNOSIS — I2510 Atherosclerotic heart disease of native coronary artery without angina pectoris: Secondary | ICD-10-CM | POA: Diagnosis not present

## 2018-12-05 DIAGNOSIS — I10 Essential (primary) hypertension: Secondary | ICD-10-CM | POA: Diagnosis not present

## 2018-12-05 DIAGNOSIS — I4891 Unspecified atrial fibrillation: Secondary | ICD-10-CM | POA: Diagnosis not present

## 2018-12-05 DIAGNOSIS — M1990 Unspecified osteoarthritis, unspecified site: Secondary | ICD-10-CM | POA: Diagnosis not present

## 2018-12-13 ENCOUNTER — Telehealth (INDEPENDENT_AMBULATORY_CARE_PROVIDER_SITE_OTHER): Payer: Medicare Other | Admitting: Cardiovascular Disease

## 2018-12-13 ENCOUNTER — Encounter: Payer: Self-pay | Admitting: Cardiovascular Disease

## 2018-12-13 VITALS — Wt 269.0 lb

## 2018-12-13 DIAGNOSIS — I10 Essential (primary) hypertension: Secondary | ICD-10-CM

## 2018-12-13 DIAGNOSIS — I4819 Other persistent atrial fibrillation: Secondary | ICD-10-CM

## 2018-12-13 DIAGNOSIS — E785 Hyperlipidemia, unspecified: Secondary | ICD-10-CM

## 2018-12-13 DIAGNOSIS — I25118 Atherosclerotic heart disease of native coronary artery with other forms of angina pectoris: Secondary | ICD-10-CM

## 2018-12-13 DIAGNOSIS — Z955 Presence of coronary angioplasty implant and graft: Secondary | ICD-10-CM

## 2018-12-13 DIAGNOSIS — Z7901 Long term (current) use of anticoagulants: Secondary | ICD-10-CM

## 2018-12-13 DIAGNOSIS — E6609 Other obesity due to excess calories: Secondary | ICD-10-CM

## 2018-12-13 NOTE — Patient Instructions (Signed)

## 2018-12-13 NOTE — Progress Notes (Signed)
Virtual Visit via Telephone Note   This visit type was conducted due to national recommendations for restrictions regarding the COVID-19 Pandemic (e.g. social distancing) in an effort to limit this patient's exposure and mitigate transmission in our community.  Due to his co-morbid illnesses, this patient is at least at moderate risk for complications without adequate follow up.  This format is felt to be most appropriate for this patient at this time.  The patient did not have access to video technology/had technical difficulties with video requiring transitioning to audio format only (telephone).  All issues noted in this document were discussed and addressed.  No physical exam could be performed with this format.  Please refer to the patient's chart for his  consent to telehealth for [facility].   Date:  12/13/2018   ID:  Martin [name], DOB Jan 06, 1956, MRN [id number]  Patient Location: Home Provider Location: Office  PCP:  Loman Brooklyn, FNP  Cardiologist:  Kate Sable, MD  Electrophysiologist:  [name], MD   Evaluation Performed:  Follow-Up Visit  Chief Complaint: Coronary artery disease, atrial fibrillation  History of Present Illness:    Martin [name] is a 64 y.o. male with a history of persistent atrial fibrillation and coronary disease status post stenting of the LAD in 2001 and drug-eluting stent placement to the RCA and mid LAD in 2011.  He saw his PCP last week (Dr. Nevada Crane). BP was 130/80. He's going for blood work later this month.  He denies chest pain and palpitations.   The patient does not have symptoms concerning for COVID-19 infection (fever, chills, cough, or new shortness of breath).    Past Medical History:  Diagnosis Date  . Atrial fibrillation ([place])    a. dx 10/2017. b. s/p DCCV on 11/19/2017 with successful conversion to NSR but back in AFIB 3 weeks later  . CAD (coronary artery disease)    a. s/p PCI to LAD 2001. b. s/p stenting to  the RCA and mid LAD December 2011, residual distal RCA disease treated medically. b. nuc 02/2013 abnormal with fixed defect seen in inferoapical, mid-inferior, and basal inferior walls, indicative of myocardial scar, no evidence of ischemia, EF 41% (Ef 55-60% by echo same time).  . Carotid artery disease ([place])    carotid Doppler course left side 5069% stenosis  . Dysrhythmia    AFib  . Habitual alcohol use   . Hyperlipidemia   . Hypertension   . Low back pain   . OSA on CPAP   . Osteoarthritis   . Polysubstance abuse ([place])    use including marijuana and vicodin,which he obtained from the street  . PVC's (premature ventricular contractions)    status post Holter monitor normal sinus rhythm otherwise asymptomatic PVCs.  . Seasonal allergies    Past Surgical History:  Procedure Laterality Date  . CARDIOVERSION N/A 11/19/2017   Procedure: CARDIOVERSION;  Surgeon: Herminio Commons, MD;  [facility];  Service: Cardiovascular;  Laterality: N/A;  . CORONARY ANGIOPLASTY WITH STENT PLACEMENT  2011  . TONSILLECTOMY       Current Meds  Medication Sig  . allopurinol (ZYLOPRIM) 300 MG tablet Take 300 mg by mouth daily.  Marland Kitchen apixaban (ELIQUIS) 5 MG TABS tablet Take 1 tablet (5 mg total) by mouth 2 (two) times daily.  Marland Kitchen atorvastatin (LIPITOR) 80 MG tablet Take 1 tablet (80 mg total) by mouth daily.  . Cholecalciferol (VITAMIN D-3) 1000 UNITS CAPS Take 1,000 Units by mouth daily.   Marland Kitchen  Cyanocobalamin (VITAMIN B-12) 2500 MCG SUBL Place 2,500 mcg under the tongue.  . diclofenac (VOLTAREN) 75 MG EC tablet Take 75 mg by mouth 2 (two) times daily.  Marland Kitchen diltiazem (CARDIZEM CD) 120 MG 24 hr capsule TAKE 1 CAPSULE BY MOUTH EVERY DAY  . fluticasone (FLONASE) 50 MCG/ACT nasal spray Place 2 sprays into the nose daily.    . folic acid (FOLVITE) 1 MG tablet TAKE ONE TABLET BY MOUTH EVERY DAY  . lisinopril (PRINIVIL,ZESTRIL) 40 MG tablet TAKE 1 TABLET BY MOUTH EVERY DAY  . loratadine (CLARITIN) 10 MG  tablet Take 10 mg by mouth daily.    . magnesium oxide (MAG-OX) 400 MG tablet Take 400 mg by mouth at bedtime.   . meclizine (ANTIVERT) 32 MG tablet Take 32 mg by mouth daily as needed for dizziness.   . Multiple Vitamin (MULTIVITAMIN) tablet Take 1 tablet by mouth 3 (three) times daily.   . nitroGLYCERIN (NITROSTAT) 0.4 MG SL tablet Place 1 tablet (0.4 mg total) under the tongue every 5 (five) minutes as needed.  . Omega-3 Fatty Acids (FISH OIL) 1200 MG CAPS Take 1,200 mg by mouth 2 (two) times daily.   . polyethylene glycol (MIRALAX / GLYCOLAX) packet Take 17 g by mouth daily as needed for mild constipation.   . triamterene-hydrochlorothiazide (MAXZIDE-25) 37.5-25 MG per tablet Take 1 tablet by mouth daily.       Allergies:   Penicillins   Social History   Tobacco Use  . Smoking status: Former Smoker    Packs/day: 1.00    Years: 35.00    Pack years: 35.00    Types: Cigarettes    Start date: 09/18/1967    Quit date: 06/09/1999    Years since quitting: 19.5  . Smokeless tobacco: Never Used  Substance Use Topics  . Alcohol use: Yes    Alcohol/week: 42.0 standard drinks    Types: 42 Shots of liquor per week    Comment: 1/2 gallon og liquor per week  . Drug use: Yes    Types: Marijuana    Comment: smokes marijuana occassionally-last smoked few weeks ago     Family Hx: The patient's family history includes Heart attack in his brother and father; Other in his mother; Stroke in his mother.  ROS:   Please see the history of present illness.     All other systems reviewed and are negative.   Prior CV studies:   The following studies were reviewed today:  NA  Labs/Other Tests and Data Reviewed:    EKG:  No ECG reviewed.  Recent Labs: 03/01/2018: BUN 24; Hemoglobin 14.4; Platelets 136; Potassium 3.5; Sodium 142 04/18/2018: Creatinine, Ser 0.90   Recent Lipid Panel Lab Results  Component Value Date/Time   CHOL  05/21/2010 05:30 AM    168        ATP III CLASSIFICATION:   <200     mg/dL   Desirable  200-239  mg/dL   Borderline High  >=240    mg/dL   High          TRIG 291 (H) 05/21/2010 05:30 AM   HDL 38 (L) 05/21/2010 05:30 AM   CHOLHDL 4.4 05/21/2010 05:30 AM   LDLCALC  05/21/2010 05:30 AM    72        Total Cholesterol/HDL:CHD Risk Coronary Heart Disease Risk Table                     Men   Women  1/2 Average  Risk   3.4   3.3  Average Risk       5.0   4.4  2 X Average Risk   9.6   7.1  3 X Average Risk  23.4   11.0        Use the calculated Patient Ratio above and the CHD Risk Table to determine the patient's CHD Risk.        ATP III CLASSIFICATION (LDL):  <100     mg/dL   Optimal  100-129  mg/dL   Near or Above                    Optimal  130-159  mg/dL   Borderline  160-189  mg/dL   High  >190     mg/dL   Very High    Wt Readings from Last 3 Encounters:  12/13/18 269 lb (122 kg)  07/26/18 269 lb (122 kg)  05/25/18 271 lb 12.8 oz (123.3 kg)     Objective:    Vital Signs:  Wt 269 lb (122 kg)   BMI 40.90 kg/m     ASSESSMENT & PLAN:    1.  Persistent atrial fibrillation: Symptomatically stable.  Remains on long-acting diltiazem 120 mg daily.  Anticoagulated with Eliquis.  2.  Coronary artery disease: Symptomatically stable.  Previous stenting to the RCA and LAD as noted above.  Continue atorvastatin.  No aspirin given need for systemic anticoagulation.  3.  Hypertension: BP normal at PCP's office last week. No changes to therapy. He is morbidly obese and certainly needs weight loss.    4.  Hyperlipidemia: Continue atorvastatin 80 mg.  LDL was 68 in April 2019 and at goal. He is getting lipids checked by PCP later this month.  5.  Obesity: We previously talked about the importance of dietary modification and exercise for weight loss.   COVID-19 Education: The signs and symptoms of COVID-19 were discussed with the patient and how to seek care for testing (follow up with PCP or arrange E-visit).  The importance of social  distancing was discussed today.  Time:   Today, I have spent 10 minutes with the patient with telehealth technology discussing the above problems.     Medication Adjustments/Labs and Tests Ordered: Current medicines are reviewed at length with the patient today.  Concerns regarding medicines are outlined above.   Tests Ordered: No orders of the defined types were placed in this encounter.   Medication Changes: No orders of the defined types were placed in this encounter.   Follow Up:  Virtual Visit or In Person in 6 month(s)  Signed, Kate Sable, MD  12/13/2018 8:53 AM    [place]

## 2018-12-29 DIAGNOSIS — Z0000 Encounter for general adult medical examination without abnormal findings: Secondary | ICD-10-CM | POA: Diagnosis not present

## 2019-01-05 DIAGNOSIS — I10 Essential (primary) hypertension: Secondary | ICD-10-CM | POA: Diagnosis not present

## 2019-01-05 DIAGNOSIS — Z1329 Encounter for screening for other suspected endocrine disorder: Secondary | ICD-10-CM | POA: Diagnosis not present

## 2019-01-12 DIAGNOSIS — Z0000 Encounter for general adult medical examination without abnormal findings: Secondary | ICD-10-CM | POA: Diagnosis not present

## 2019-01-12 DIAGNOSIS — M1990 Unspecified osteoarthritis, unspecified site: Secondary | ICD-10-CM | POA: Diagnosis not present

## 2019-01-12 DIAGNOSIS — I4891 Unspecified atrial fibrillation: Secondary | ICD-10-CM | POA: Diagnosis not present

## 2019-01-12 DIAGNOSIS — I10 Essential (primary) hypertension: Secondary | ICD-10-CM | POA: Diagnosis not present

## 2019-01-12 DIAGNOSIS — I2510 Atherosclerotic heart disease of native coronary artery without angina pectoris: Secondary | ICD-10-CM | POA: Diagnosis not present

## 2019-02-06 ENCOUNTER — Other Ambulatory Visit: Payer: Self-pay | Admitting: Cardiovascular Disease

## 2019-02-15 DIAGNOSIS — G4733 Obstructive sleep apnea (adult) (pediatric): Secondary | ICD-10-CM | POA: Diagnosis not present

## 2019-03-07 ENCOUNTER — Inpatient Hospital Stay (HOSPITAL_COMMUNITY)
Admission: EM | Admit: 2019-03-07 | Discharge: 2019-03-27 | DRG: 233 | Disposition: A | Discharge status: Rehabilitation Facility | Payer: Medicare Other | Attending: Cardiothoracic Surgery | Admitting: Cardiothoracic Surgery

## 2019-03-07 ENCOUNTER — Other Ambulatory Visit: Payer: Self-pay

## 2019-03-07 ENCOUNTER — Emergency Department (HOSPITAL_COMMUNITY): Payer: Medicare Other

## 2019-03-07 ENCOUNTER — Telehealth: Payer: Self-pay | Admitting: Cardiovascular Disease

## 2019-03-07 ENCOUNTER — Encounter (HOSPITAL_COMMUNITY): Payer: Self-pay

## 2019-03-07 DIAGNOSIS — R339 Retention of urine, unspecified: Secondary | ICD-10-CM | POA: Diagnosis not present

## 2019-03-07 DIAGNOSIS — M1990 Unspecified osteoarthritis, unspecified site: Secondary | ICD-10-CM | POA: Diagnosis present

## 2019-03-07 DIAGNOSIS — I2510 Atherosclerotic heart disease of native coronary artery without angina pectoris: Secondary | ICD-10-CM | POA: Diagnosis not present

## 2019-03-07 DIAGNOSIS — Z20828 Contact with and (suspected) exposure to other viral communicable diseases: Secondary | ICD-10-CM | POA: Diagnosis present

## 2019-03-07 DIAGNOSIS — D696 Thrombocytopenia, unspecified: Secondary | ICD-10-CM | POA: Diagnosis present

## 2019-03-07 DIAGNOSIS — I10 Essential (primary) hypertension: Secondary | ICD-10-CM | POA: Diagnosis present

## 2019-03-07 DIAGNOSIS — E1151 Type 2 diabetes mellitus with diabetic peripheral angiopathy without gangrene: Secondary | ICD-10-CM | POA: Diagnosis not present

## 2019-03-07 DIAGNOSIS — I200 Unstable angina: Secondary | ICD-10-CM | POA: Diagnosis present

## 2019-03-07 DIAGNOSIS — Z7901 Long term (current) use of anticoagulants: Secondary | ICD-10-CM

## 2019-03-07 DIAGNOSIS — I472 Ventricular tachycardia: Secondary | ICD-10-CM | POA: Diagnosis not present

## 2019-03-07 DIAGNOSIS — R079 Chest pain, unspecified: Secondary | ICD-10-CM | POA: Diagnosis not present

## 2019-03-07 DIAGNOSIS — S270XXD Traumatic pneumothorax, subsequent encounter: Secondary | ICD-10-CM

## 2019-03-07 DIAGNOSIS — Z23 Encounter for immunization: Secondary | ICD-10-CM | POA: Diagnosis not present

## 2019-03-07 DIAGNOSIS — Z791 Long term (current) use of non-steroidal anti-inflammatories (NSAID): Secondary | ICD-10-CM | POA: Diagnosis not present

## 2019-03-07 DIAGNOSIS — M109 Gout, unspecified: Secondary | ICD-10-CM | POA: Diagnosis not present

## 2019-03-07 DIAGNOSIS — E46 Unspecified protein-calorie malnutrition: Secondary | ICD-10-CM | POA: Diagnosis not present

## 2019-03-07 DIAGNOSIS — I493 Ventricular premature depolarization: Secondary | ICD-10-CM | POA: Diagnosis present

## 2019-03-07 DIAGNOSIS — Z79899 Other long term (current) drug therapy: Secondary | ICD-10-CM | POA: Diagnosis not present

## 2019-03-07 DIAGNOSIS — I97821 Postprocedural cerebrovascular infarction during other surgery: Secondary | ICD-10-CM | POA: Diagnosis not present

## 2019-03-07 DIAGNOSIS — D62 Acute posthemorrhagic anemia: Secondary | ICD-10-CM | POA: Diagnosis not present

## 2019-03-07 DIAGNOSIS — I952 Hypotension due to drugs: Secondary | ICD-10-CM | POA: Diagnosis not present

## 2019-03-07 DIAGNOSIS — Z7951 Long term (current) use of inhaled steroids: Secondary | ICD-10-CM | POA: Diagnosis not present

## 2019-03-07 DIAGNOSIS — N390 Urinary tract infection, site not specified: Secondary | ICD-10-CM | POA: Diagnosis not present

## 2019-03-07 DIAGNOSIS — I351 Nonrheumatic aortic (valve) insufficiency: Secondary | ICD-10-CM | POA: Diagnosis not present

## 2019-03-07 DIAGNOSIS — E871 Hypo-osmolality and hyponatremia: Secondary | ICD-10-CM | POA: Diagnosis not present

## 2019-03-07 DIAGNOSIS — Z95828 Presence of other vascular implants and grafts: Secondary | ICD-10-CM

## 2019-03-07 DIAGNOSIS — I69354 Hemiplegia and hemiparesis following cerebral infarction affecting left non-dominant side: Secondary | ICD-10-CM

## 2019-03-07 DIAGNOSIS — R29705 NIHSS score 5: Secondary | ICD-10-CM | POA: Diagnosis not present

## 2019-03-07 DIAGNOSIS — I63421 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: Secondary | ICD-10-CM | POA: Diagnosis not present

## 2019-03-07 DIAGNOSIS — N179 Acute kidney failure, unspecified: Secondary | ICD-10-CM | POA: Diagnosis not present

## 2019-03-07 DIAGNOSIS — R402364 Coma scale, best motor response, obeys commands, 24 hours or more after hospital admission: Secondary | ICD-10-CM | POA: Diagnosis not present

## 2019-03-07 DIAGNOSIS — Z4682 Encounter for fitting and adjustment of non-vascular catheter: Secondary | ICD-10-CM | POA: Diagnosis not present

## 2019-03-07 DIAGNOSIS — R5383 Other fatigue: Secondary | ICD-10-CM

## 2019-03-07 DIAGNOSIS — G319 Degenerative disease of nervous system, unspecified: Secondary | ICD-10-CM | POA: Diagnosis not present

## 2019-03-07 DIAGNOSIS — I214 Non-ST elevation (NSTEMI) myocardial infarction: Principal | ICD-10-CM

## 2019-03-07 DIAGNOSIS — I5030 Unspecified diastolic (congestive) heart failure: Secondary | ICD-10-CM | POA: Diagnosis present

## 2019-03-07 DIAGNOSIS — J90 Pleural effusion, not elsewhere classified: Secondary | ICD-10-CM | POA: Diagnosis not present

## 2019-03-07 DIAGNOSIS — Z91048 Other nonmedicinal substance allergy status: Secondary | ICD-10-CM

## 2019-03-07 DIAGNOSIS — I639 Cerebral infarction, unspecified: Secondary | ICD-10-CM | POA: Diagnosis not present

## 2019-03-07 DIAGNOSIS — I6389 Other cerebral infarction: Secondary | ICD-10-CM | POA: Diagnosis not present

## 2019-03-07 DIAGNOSIS — I4891 Unspecified atrial fibrillation: Secondary | ICD-10-CM | POA: Diagnosis not present

## 2019-03-07 DIAGNOSIS — Z6841 Body Mass Index (BMI) 40.0 and over, adult: Secondary | ICD-10-CM | POA: Diagnosis not present

## 2019-03-07 DIAGNOSIS — Z87891 Personal history of nicotine dependence: Secondary | ICD-10-CM

## 2019-03-07 DIAGNOSIS — I4819 Other persistent atrial fibrillation: Secondary | ICD-10-CM | POA: Diagnosis not present

## 2019-03-07 DIAGNOSIS — I088 Other rheumatic multiple valve diseases: Secondary | ICD-10-CM | POA: Diagnosis not present

## 2019-03-07 DIAGNOSIS — I5043 Acute on chronic combined systolic (congestive) and diastolic (congestive) heart failure: Secondary | ICD-10-CM | POA: Diagnosis present

## 2019-03-07 DIAGNOSIS — I672 Cerebral atherosclerosis: Secondary | ICD-10-CM | POA: Diagnosis not present

## 2019-03-07 DIAGNOSIS — Z880 Allergy status to penicillin: Secondary | ICD-10-CM

## 2019-03-07 DIAGNOSIS — K567 Ileus, unspecified: Secondary | ICD-10-CM | POA: Diagnosis not present

## 2019-03-07 DIAGNOSIS — Z09 Encounter for follow-up examination after completed treatment for conditions other than malignant neoplasm: Secondary | ICD-10-CM

## 2019-03-07 DIAGNOSIS — E785 Hyperlipidemia, unspecified: Secondary | ICD-10-CM | POA: Diagnosis not present

## 2019-03-07 DIAGNOSIS — S2249XD Multiple fractures of ribs, unspecified side, subsequent encounter for fracture with routine healing: Secondary | ICD-10-CM

## 2019-03-07 DIAGNOSIS — I25110 Atherosclerotic heart disease of native coronary artery with unstable angina pectoris: Secondary | ICD-10-CM | POA: Diagnosis not present

## 2019-03-07 DIAGNOSIS — R0989 Other specified symptoms and signs involving the circulatory and respiratory systems: Secondary | ICD-10-CM | POA: Diagnosis not present

## 2019-03-07 DIAGNOSIS — I5031 Acute diastolic (congestive) heart failure: Secondary | ICD-10-CM | POA: Diagnosis not present

## 2019-03-07 DIAGNOSIS — R0902 Hypoxemia: Secondary | ICD-10-CM | POA: Diagnosis present

## 2019-03-07 DIAGNOSIS — M10071 Idiopathic gout, right ankle and foot: Secondary | ICD-10-CM | POA: Diagnosis not present

## 2019-03-07 DIAGNOSIS — I63233 Cerebral infarction due to unspecified occlusion or stenosis of bilateral carotid arteries: Secondary | ICD-10-CM | POA: Diagnosis not present

## 2019-03-07 DIAGNOSIS — Z951 Presence of aortocoronary bypass graft: Secondary | ICD-10-CM | POA: Diagnosis not present

## 2019-03-07 DIAGNOSIS — R402134 Coma scale, eyes open, to sound, 24 hours or more after hospital admission: Secondary | ICD-10-CM | POA: Diagnosis not present

## 2019-03-07 DIAGNOSIS — I5032 Chronic diastolic (congestive) heart failure: Secondary | ICD-10-CM | POA: Diagnosis not present

## 2019-03-07 DIAGNOSIS — E039 Hypothyroidism, unspecified: Secondary | ICD-10-CM | POA: Diagnosis present

## 2019-03-07 DIAGNOSIS — F411 Generalized anxiety disorder: Secondary | ICD-10-CM | POA: Diagnosis not present

## 2019-03-07 DIAGNOSIS — G4733 Obstructive sleep apnea (adult) (pediatric): Secondary | ICD-10-CM | POA: Diagnosis not present

## 2019-03-07 DIAGNOSIS — I110 Hypertensive heart disease with heart failure: Secondary | ICD-10-CM | POA: Diagnosis present

## 2019-03-07 DIAGNOSIS — I6782 Cerebral ischemia: Secondary | ICD-10-CM | POA: Diagnosis not present

## 2019-03-07 DIAGNOSIS — Z955 Presence of coronary angioplasty implant and graft: Secondary | ICD-10-CM | POA: Diagnosis not present

## 2019-03-07 DIAGNOSIS — I6340 Cerebral infarction due to embolism of unspecified cerebral artery: Secondary | ICD-10-CM | POA: Insufficient documentation

## 2019-03-07 DIAGNOSIS — Z8249 Family history of ischemic heart disease and other diseases of the circulatory system: Secondary | ICD-10-CM

## 2019-03-07 DIAGNOSIS — E8809 Other disorders of plasma-protein metabolism, not elsewhere classified: Secondary | ICD-10-CM | POA: Diagnosis not present

## 2019-03-07 DIAGNOSIS — I480 Paroxysmal atrial fibrillation: Secondary | ICD-10-CM | POA: Diagnosis not present

## 2019-03-07 DIAGNOSIS — I77810 Thoracic aortic ectasia: Secondary | ICD-10-CM | POA: Diagnosis present

## 2019-03-07 DIAGNOSIS — N320 Bladder-neck obstruction: Secondary | ICD-10-CM | POA: Diagnosis present

## 2019-03-07 DIAGNOSIS — R5381 Other malaise: Secondary | ICD-10-CM | POA: Diagnosis not present

## 2019-03-07 DIAGNOSIS — R609 Edema, unspecified: Secondary | ICD-10-CM | POA: Diagnosis not present

## 2019-03-07 DIAGNOSIS — D72829 Elevated white blood cell count, unspecified: Secondary | ICD-10-CM | POA: Diagnosis not present

## 2019-03-07 DIAGNOSIS — J9811 Atelectasis: Secondary | ICD-10-CM | POA: Diagnosis not present

## 2019-03-07 DIAGNOSIS — R0602 Shortness of breath: Secondary | ICD-10-CM | POA: Diagnosis not present

## 2019-03-07 DIAGNOSIS — Z01810 Encounter for preprocedural cardiovascular examination: Secondary | ICD-10-CM | POA: Diagnosis not present

## 2019-03-07 DIAGNOSIS — I313 Pericardial effusion (noninflammatory): Secondary | ICD-10-CM | POA: Diagnosis not present

## 2019-03-07 DIAGNOSIS — Z452 Encounter for adjustment and management of vascular access device: Secondary | ICD-10-CM | POA: Diagnosis not present

## 2019-03-07 DIAGNOSIS — I6523 Occlusion and stenosis of bilateral carotid arteries: Secondary | ICD-10-CM | POA: Diagnosis present

## 2019-03-07 DIAGNOSIS — Z9689 Presence of other specified functional implants: Secondary | ICD-10-CM

## 2019-03-07 LAB — BASIC METABOLIC PANEL
Anion gap: 8 (ref 5–15)
BUN: 21 mg/dL (ref 8–23)
CO2: 26 mmol/L (ref 22–32)
Calcium: 9.6 mg/dL (ref 8.9–10.3)
Chloride: 105 mmol/L (ref 98–111)
Creatinine, Ser: 0.8 mg/dL (ref 0.61–1.24)
GFR calc Af Amer: >60 mL/min (ref >60)
GFR calc non Af Amer: >60 mL/min (ref >60)
Glucose, Bld: 131 mg/dL — ABNORMAL HIGH (ref 70–99)
Potassium: 3.8 mmol/L (ref 3.5–5.1)
Sodium: 139 mmol/L (ref 135–145)

## 2019-03-07 LAB — SARS CORONAVIRUS 2 BY RT PCR (HOSPITAL ORDER, PERFORMED IN ~~LOC~~ HOSPITAL LAB): SARS Coronavirus 2: NEGATIVE

## 2019-03-07 LAB — CBC
HCT: 44.9 % (ref 39.0–52.0)
Hemoglobin: 14.8 g/dL (ref 13.0–17.0)
MCH: 33.2 pg (ref 26.0–34.0)
MCHC: 33 g/dL (ref 30.0–36.0)
MCV: 100.7 fL — ABNORMAL HIGH (ref 80.0–100.0)
Platelets: 153 10*3/uL (ref 150–400)
RBC: 4.46 MIL/uL (ref 4.22–5.81)
RDW: 13 % (ref 11.5–15.5)
WBC: 7.3 10*3/uL (ref 4.0–10.5)
nRBC: 0 % (ref 0.0–0.2)

## 2019-03-07 LAB — APTT: aPTT: 40 seconds — ABNORMAL HIGH (ref 24–36)

## 2019-03-07 LAB — TROPONIN I (HIGH SENSITIVITY)
Troponin I (High Sensitivity): 71 ng/L — ABNORMAL HIGH (ref <18)
Troponin I (High Sensitivity): 82 ng/L — ABNORMAL HIGH (ref <18)

## 2019-03-07 LAB — PROTIME-INR
INR: 1 (ref 0.8–1.2)
Prothrombin Time: 13.4 seconds (ref 11.4–15.2)

## 2019-03-07 LAB — HEPARIN LEVEL (UNFRACTIONATED): Heparin Unfractionated: 1.47 IU/mL — ABNORMAL HIGH (ref 0.30–0.70)

## 2019-03-07 MED ORDER — METOPROLOL TARTRATE 25 MG PO TABS
25.0000 mg | ORAL_TABLET | Freq: Two times a day (BID) | ORAL | Status: DC
  Administered 2019-03-08: 25 mg via ORAL
  Filled 2019-03-07: qty 1

## 2019-03-07 MED ORDER — SODIUM CHLORIDE 0.9 % WEIGHT BASED INFUSION
3.0000 mL/kg/h | INTRAVENOUS | Status: DC
  Administered 2019-03-08: 3 mL/kg/h via INTRAVENOUS

## 2019-03-07 MED ORDER — NITROGLYCERIN 0.4 MG SL SUBL: 0.4000 mg | SUBLINGUAL_TABLET | SUBLINGUAL | Status: DC | PRN

## 2019-03-07 MED ORDER — SODIUM CHLORIDE 0.9 % WEIGHT BASED INFUSION
1.0000 mL/kg/h | INTRAVENOUS | Status: DC
  Administered 2019-03-08: 1 mL/kg/h via INTRAVENOUS

## 2019-03-07 MED ORDER — TRIAMTERENE-HCTZ 37.5-25 MG PO TABS
1.0000 | ORAL_TABLET | Freq: Every morning | ORAL | Status: DC
  Administered 2019-03-08 – 2019-03-09 (×2): 1 via ORAL
  Filled 2019-03-07 (×3): qty 1

## 2019-03-07 MED ORDER — LISINOPRIL 40 MG PO TABS
40.0000 mg | ORAL_TABLET | Freq: Every day | ORAL | Status: DC
  Administered 2019-03-08 – 2019-03-09 (×2): 40 mg via ORAL
  Filled 2019-03-07 (×3): qty 1

## 2019-03-07 MED ORDER — ASPIRIN 81 MG PO CHEW: 324.0000 mg | CHEWABLE_TABLET | ORAL | Status: AC

## 2019-03-07 MED ORDER — ASPIRIN 81 MG PO CHEW
324.0000 mg | CHEWABLE_TABLET | Freq: Once | ORAL | Status: AC
  Administered 2019-03-07: 324 mg via ORAL
  Filled 2019-03-07: qty 4

## 2019-03-07 MED ORDER — SODIUM CHLORIDE 0.9% FLUSH: 3.0000 mL | INTRAVENOUS | Status: DC | PRN

## 2019-03-07 MED ORDER — VITAMIN B-12 2500 MCG SL SUBL: 2500.0000 ug | SUBLINGUAL_TABLET | Freq: Every morning | SUBLINGUAL | Status: DC

## 2019-03-07 MED ORDER — FOLIC ACID 1 MG PO TABS
1.0000 mg | ORAL_TABLET | Freq: Every day | ORAL | Status: DC
  Administered 2019-03-08 – 2019-03-27 (×19): 1 mg via ORAL
  Filled 2019-03-07 (×19): qty 1

## 2019-03-07 MED ORDER — ONDANSETRON HCL 4 MG/2ML IJ SOLN: 4.0000 mg | Freq: Four times a day (QID) | INTRAMUSCULAR | Status: DC | PRN

## 2019-03-07 MED ORDER — SODIUM CHLORIDE 0.9 % IV SOLN: 250.0000 mL | INTRAVENOUS | Status: DC | PRN

## 2019-03-07 MED ORDER — ASPIRIN 300 MG RE SUPP
300.0000 mg | RECTAL | Status: AC
  Filled 2019-03-07: qty 1

## 2019-03-07 MED ORDER — ATORVASTATIN CALCIUM 80 MG PO TABS: 80.0000 mg | ORAL_TABLET | Freq: Every day | ORAL | Status: DC

## 2019-03-07 MED ORDER — ASPIRIN EC 81 MG PO TBEC
81.0000 mg | DELAYED_RELEASE_TABLET | Freq: Every day | ORAL | Status: DC
  Administered 2019-03-09: 81 mg via ORAL
  Filled 2019-03-07: qty 1

## 2019-03-07 MED ORDER — ACETAMINOPHEN 325 MG PO TABS: 650.0000 mg | ORAL_TABLET | ORAL | Status: DC | PRN

## 2019-03-07 MED ORDER — INFLUENZA VAC A&B SA ADJ QUAD 0.5 ML IM PRSY
0.5000 mL | PREFILLED_SYRINGE | INTRAMUSCULAR | Status: DC
  Filled 2019-03-07: qty 0.5

## 2019-03-07 MED ORDER — ASPIRIN 81 MG PO CHEW
81.0000 mg | CHEWABLE_TABLET | ORAL | Status: AC
  Administered 2019-03-08: 81 mg via ORAL
  Filled 2019-03-07: qty 1

## 2019-03-07 MED ORDER — VITAMIN B-12 1000 MCG PO TABS
2500.0000 ug | ORAL_TABLET | Freq: Every day | ORAL | Status: DC
  Administered 2019-03-08 – 2019-03-09 (×2): 2500 ug via ORAL
  Filled 2019-03-07 (×2): qty 3

## 2019-03-07 MED ORDER — DILTIAZEM HCL ER COATED BEADS 120 MG PO CP24
120.0000 mg | ORAL_CAPSULE | Freq: Every day | ORAL | Status: DC
  Administered 2019-03-08: 11:00:00 120 mg via ORAL
  Filled 2019-03-07: qty 1

## 2019-03-07 MED ORDER — OMEGA-3-ACID ETHYL ESTERS 1 G PO CAPS
2.0000 g | ORAL_CAPSULE | Freq: Two times a day (BID) | ORAL | Status: DC
  Administered 2019-03-08 – 2019-03-09 (×5): 2 g via ORAL
  Filled 2019-03-07 (×5): qty 2

## 2019-03-07 MED ORDER — SODIUM CHLORIDE 0.9% FLUSH: 3.0000 mL | Freq: Once | INTRAVENOUS | Status: DC

## 2019-03-07 MED ORDER — MAGNESIUM OXIDE 400 (241.3 MG) MG PO TABS
400.0000 mg | ORAL_TABLET | Freq: Every day | ORAL | Status: DC
  Administered 2019-03-08 – 2019-03-09 (×3): 400 mg via ORAL
  Filled 2019-03-07 (×3): qty 1

## 2019-03-07 MED ORDER — ALLOPURINOL 300 MG PO TABS
300.0000 mg | ORAL_TABLET | Freq: Every morning | ORAL | Status: DC
  Administered 2019-03-08 – 2019-03-27 (×19): 300 mg via ORAL
  Filled 2019-03-07 (×19): qty 1

## 2019-03-07 MED ORDER — HEPARIN (PORCINE) 25000 UT/250ML-% IV SOLN
1600.0000 [IU]/h | INTRAVENOUS | Status: DC
  Administered 2019-03-07: 1300 [IU]/h via INTRAVENOUS
  Filled 2019-03-07: qty 250

## 2019-03-07 NOTE — ED Triage Notes (Signed)
Pt has been having chest pain for the last few weeks. Took 2 Nitro today. Last took at 0730. Central chest pain. NAD.

## 2019-03-07 NOTE — ED Notes (Signed)
Patient is resting comfortably. 

## 2019-03-07 NOTE — Telephone Encounter (Signed)
I think he should go to the ED. It sounds like he needs an ischemic evaluation.

## 2019-03-07 NOTE — ED Notes (Signed)
[facility] arrived for patient

## 2019-03-07 NOTE — H&P (Signed)
PCP:  [name], MD  PCP-Cardiology: Kate Sable, MD     Reason for Admission: NSTEMI   HPI:    63 yo with history of CAD and paroxysmal atrial fibrillation presents with chest pain and possible NSTEMI.  Patient had PCI to LAD in 2001, then PCI to RCA and mid LAD again in 2011. No coronary intervention since that time.  He had an echo in 5/19 showing EF 55-60% with mild aortic stenosis. He additionally has paroxysmal atrial fibrillation.  He was cardioverted to NSR in 6/19 but went back into atrial fibrillation afterwards.  Today, he is in sinus rhythm versus an ectopic atrial rhythm.  He takes Eliquis at home.    He works full time in [employer], [employer] (consulting).  He no longer smokes.   He had been doing reasonably well up until about 2 wks ago.  At that time, he started to get episodes of central substernal chest pressure.  He has taken about 10 NTGs.  Chest pressure has not been clearly related to exertion.  This morning, he woke up with chest pressure and took NTG with resolution.  Again about an hour later, he had more chest pressure.  He took NTG again and it resolved.  He did not have more chest pressure, but felt "bad" at work.  He decided to go to the ER at [facility].   Iin the ER, ECG showed NSR versus ectopic atrial rhythm, old ASMI, lateral TWIs similar to prior. HsTnI was mildly elevated without a large delta, 71 => 82. He is hypertensive. He is not having chest pain/pressure currently.   Review of Systems: All systems reviewed and negative except as per HPI.  Home Medications Prior to Admission medications   Medication Sig Start Date End Date Taking? Authorizing Provider  allopurinol (ZYLOPRIM) 300 MG tablet Take 300 mg by mouth every morning.    Yes [provider]  apixaban (ELIQUIS) 5 MG TABS tablet Take 1 tablet (5 mg total) by mouth 2 (two) times daily. 11/09/18  Yes Dunn, Dayna N, PA-C  atorvastatin (LIPITOR) 80 MG tablet Take 1 tablet (80 mg total) by mouth daily.  Patient taking differently: Take 80 mg by mouth every evening.  09/22/18  Yes Herminio Commons, MD  Cholecalciferol (VITAMIN D-3) 1000 UNITS CAPS Take 1,000 Units by mouth every morning.    Yes [provider]  Cyanocobalamin (VITAMIN B-12) 2500 MCG SUBL Place 2,500 mcg under the tongue every morning.    Yes [provider]  diclofenac (VOLTAREN) 75 MG EC tablet Take 75 mg by mouth 2 (two) times daily.   Yes [provider]  diltiazem (CARDIZEM CD) 120 MG 24 hr capsule TAKE 1 CAPSULE BY MOUTH EVERY DAY Patient taking differently: Take 120 mg by mouth every morning.  08/23/18  Yes Herminio Commons, MD  fluticasone (FLONASE) 50 MCG/ACT nasal spray Place 2 sprays into the nose daily.     Yes [provider]  folic acid (FOLVITE) 1 MG tablet TAKE ONE TABLET BY MOUTH EVERY DAY Patient taking differently: Take 1 mg by mouth every morning.  02/07/12  Yes de Stanford Scotland, MD  lisinopril (PRINIVIL,ZESTRIL) 40 MG tablet TAKE 1 TABLET BY MOUTH EVERY DAY Patient taking differently: Take 40 mg by mouth every morning.  07/26/17  Yes Herminio Commons, MD  loratadine (CLARITIN) 10 MG tablet Take 10 mg by mouth every morning.    Yes [provider]  magnesium oxide (MAG-OX)  400 MG tablet Take 400 mg by mouth at bedtime.    Yes [provider]  meclizine (ANTIVERT) 32 MG tablet Take 32 mg by mouth daily as needed for dizziness.    Yes [provider]  nitroGLYCERIN (NITROSTAT) 0.4 MG SL tablet PLACE 1 TABLET (0.4 MG TOTAL) UNDER THE TONGUE EVERY 5 (FIVE) MINUTES AS NEEDED. Patient taking differently: Place 0.4 mg under the tongue every 5 (five) minutes as needed for chest pain.  02/06/19  Yes Herminio Commons, MD  Omega-3 Fatty Acids (FISH OIL) 1200 MG CAPS Take 1,200 mg by mouth 2 (two) times daily.    Yes [provider]  polyethylene glycol (MIRALAX / GLYCOLAX) packet Take 17 g by mouth 3 (three) times a week.    Yes [provider]  triamterene-hydrochlorothiazide (MAXZIDE-25) 37.5-25 MG per tablet Take 1 tablet by mouth every morning.    Yes [provider]    Past Medical History: Past Medical History:  Diagnosis Date  . Atrial fibrillation ([place])    a. dx 10/2017. b. s/p DCCV on 11/19/2017 with successful conversion to NSR but back in AFIB 3 weeks later  . CAD (coronary artery disease)    a. s/p PCI to LAD 2001. b. s/p stenting to the RCA and mid LAD December 2011, residual distal RCA disease treated medically. b. nuc 02/2013 abnormal with fixed defect seen in inferoapical, mid-inferior, and basal inferior walls, indicative of myocardial scar, no evidence of ischemia, EF 41% (Ef 55-60% by echo same time).  . Carotid artery disease ([place])    carotid Doppler course left side 5069% stenosis  . Dysrhythmia    AFib  . Habitual alcohol use   . Hyperlipidemia   . Hypertension   . Low back pain   . OSA on CPAP   . Osteoarthritis   . Polysubstance abuse ([place])    use including marijuana and vicodin,which he obtained from the street  . PVC's (premature ventricular contractions)    status post Holter monitor normal sinus rhythm otherwise asymptomatic PVCs.  . Seasonal allergies     Past Surgical History: Past Surgical History:  Procedure Laterality Date  . CARDIOVERSION N/A 11/19/2017   Procedure: CARDIOVERSION;  Surgeon: Herminio Commons, MD;  [facility];  Service: Cardiovascular;  Laterality: N/A;  . CORONARY ANGIOPLASTY WITH STENT PLACEMENT  2011  . TONSILLECTOMY      Family History:  Family History  Problem Relation Age of Onset  . Stroke Mother   . Other Mother        small bowel obstruction  . Heart attack Father   . Heart attack Brother     Social History: Social History   Socioeconomic History  . Marital status: Married    Spouse name: Not on file  . Number of children: Not on file  . Years of education: Not on file  . Highest education level: Not on file   Occupational History    Employer: UNEMPLOYED    Comment: works Social worker employed  Social Needs  . Financial resource strain: Not on file  . Food insecurity    Worry: Not on file    Inability: Not on file  . Transportation needs    Medical: Not on file    Non-medical: Not on file  Tobacco Use  . Smoking status: Former Smoker    Packs/day: 1.00    Years: 35.00    Pack years: 35.00    Types: Cigarettes    Start  date: 09/18/1967    Quit date: 06/09/1999    Years since quitting: 19.7  . Smokeless tobacco: Never Used  Substance and Sexual Activity  . Alcohol use: Yes    Alcohol/week: 42.0 standard drinks    Types: 42 Shots of liquor per week    Comment: 1/2 gallon og liquor per week  . Drug use: Yes    Types: Marijuana    Comment: smokes marijuana occassionally-last smoked few weeks ago  . Sexual activity: Yes    Birth control/protection: None  Lifestyle  . Physical activity    Days per week: Not on file    Minutes per session: Not on file  . Stress: Not on file  Relationships  . Social Herbalist on phone: Not on file    Gets together: Not on file    Attends religious service: Not on file    Active member of club or organization: Not on file    Attends meetings of clubs or organizations: Not on file    Relationship status: Not on file  Other Topics Concern  . Not on file  Social History Narrative   No exercise    Allergies:  Allergies  Allergen Reactions  . Penicillins Hives and Rash    Has patient had a PCN reaction causing immediate rash, facial/tongue/throat swelling, SOB or lightheadedness with hypotension: Unknown Has patient had a PCN reaction causing severe rash involving mucus membranes or skin necrosis: Unknown Has patient had a PCN reaction that required hospitalization: No Has patient had a PCN reaction occurring within the last 10 years: No If all of the above answers are "NO", then may proceed with Cephalosporin use.       Objective:    Vital Signs:   Temp:  [97.8 F (36.6 C)-97.9 F (36.6 C)] 97.8 F (36.6 C) (09/29 2247) Pulse Rate:  [58-62] 60 (09/29 2247) Resp:  [13-20] 18 (09/29 2247) BP: (128-184)/(72-105) 182/105 (09/29 2247) SpO2:  [95 %-98 %] 95 % (09/29 2247) Weight:  [122.5 kg] 122.5 kg (09/29 1345) Last BM Date: 03/07/19 Filed Weights   03/07/19 1345  Weight: 122.5 kg     Physical Exam     General:  Well appearing. No respiratory difficulty HEENT: Normal Neck: Supple. no JVD. Carotids 2+ bilat; no bruits. No lymphadenopathy or thyromegaly appreciated. Cor: PMI nondisplaced. Regular rate & rhythm. No rubs, gallops or murmurs. Lungs: Clear Abdomen: Soft, nontender, nondistended. No hepatosplenomegaly. No bruits or masses. Good bowel sounds. Extremities: No cyanosis, clubbing, rash, edema Neuro: Alert & oriented x 3, cranial nerves grossly intact. moves all 4 extremities w/o difficulty. Affect pleasant.   Telemetry   NSR 60s (personally reviewed)  EKG   NSR versus ectopic atrial rhythm, old ASMI, PVC, lateral TWIs (personally reviewed)  Labs     Basic Metabolic Panel: Recent Labs  Lab 03/07/19 1416  NA 139  K 3.8  CL 105  CO2 26  GLUCOSE 131*  BUN 21  CREATININE 0.80  CALCIUM 9.6    Liver Function Tests: No results for input(s): AST, ALT, ALKPHOS, BILITOT, PROT, ALBUMIN in the last 168 hours. No results for input(s): LIPASE, AMYLASE in the last 168 hours. No results for input(s): AMMONIA in the last 168 hours.  CBC: Recent Labs  Lab 03/07/19 1416  WBC 7.3  HGB 14.8  HCT 44.9  MCV 100.7*  PLT 153    Cardiac Enzymes: No results for input(s): CKTOTAL, CKMB, CKMBINDEX, TROPONINI in the last 168 hours.  BNP:  BNP (last 3 results) No results for input(s): BNP in the last 8760 hours.  ProBNP (last 3 results) No results for input(s): PROBNP in the last 8760 hours.   CBG: No results for input(s): GLUCAP in the last 168 hours.  Coagulation Studies:  Recent Labs    03/07/19 1828  LABPROT 13.4  INR 1.0    Imaging: Dg Chest 2 View  Result Date: 03/07/2019 CLINICAL DATA:  Chest pain for 2 weeks. EXAM: CHEST - 2 VIEW COMPARISON:  10/28/2017 and prior radiographs FINDINGS: Cardiomegaly and LEFT basilar scarring again noted. There is no evidence of focal airspace disease, pulmonary edema, suspicious pulmonary nodule/mass, pleural effusion, or pneumothorax. No acute bony abnormalities are identified. IMPRESSION: Cardiomegaly without evidence of acute cardiopulmonary disease. Electronically Signed   By: Margarette Canada M.D.   On: 03/07/2019 14:30      Assessment/Plan   1. CAD: Patient with history of PCI to LAD in 2001, then mid LAD and RCA in 2011.  No intervention since that time.  He presents with chest pressure resolving with NTG.  ECG abnormal but unchanged from prior.  Hs-TnI with an equivocal mild elevation with delta < 20.  However, based on history and overall findings, I am concerned for small NSTEMI.  - Hold Eliquis (last dose this am) and start heparin gtt.  - ASA 81 - Atorvastatin 80 daily - Metoprolol 25 mg bid.  - I will set up for [facility] tomorrow morning.  We discussed risks/benefits and he agreed to procedure.  - I will order echo.  2. HTN: Continue home triamterene/HCTZ , diltiazem, and lisinopril, will give dose of lisinopril tonight.  3. Atrial fibrillation: He is in NSR (versus ectopic atrial rhythm with abnormal P axis) today.   - Hold Eliquis for cath, restart afterwards.   Loralie Champagne, MD 03/07/2019, 11:21 PM  Advanced Heart Failure Team Pager [phone] (M-F; [place])  Please contact [facility] for night-coverage after hours (4p -7a ) and weekends on [email or web address]

## 2019-03-07 NOTE — ED Notes (Signed)
Called AC for IV pump

## 2019-03-07 NOTE — ED Notes (Signed)
Pt sitting on side of bed to use urinal

## 2019-03-07 NOTE — Telephone Encounter (Signed)
Pt denies any active chest pain at this time - c/o chest pain every few days with some SOB when he bends over for the last week - also says he doesn't have much energy - says he has been taking NTG daily for the last week an took 2 today a few hours apart this morning - denies nausea/dizziness/swelling - BP today is 155/77 HR 60 - will forward to provider - pt understands that if chest pain returns or symptoms worsen he needs ED evaluation

## 2019-03-07 NOTE — Telephone Encounter (Signed)
Pt c/o of Chest Pain: 1. Are you having CP right now? NO  2. Are you experiencing any other symptoms (ex. SOB, nausea, vomiting, sweating)?  no 3. How long have you been experiencing CP? Off & on for a week.  4. Is your CP continuous or coming and going? OFF AND ON  5. Have you taken Nitroglycerin? Took 2 pills today   Took one 5AM and 730AM

## 2019-03-07 NOTE — Telephone Encounter (Signed)
Pt agreeable and will go to [facility] ED

## 2019-03-07 NOTE — Progress Notes (Signed)
Pt arrived to the unit via [facility]. Pt denies CP or SOB.  Placed on telemetry. Cardiology alerted to patients arrival. Pt states his wife is aware of his whereabouts and does not need an update at this time.

## 2019-03-07 NOTE — Progress Notes (Signed)
ANTICOAGULATION CONSULT NOTE - Initial Consult  Pharmacy Consult for heparin Indication: ACS/STEMI  Allergies  Allergen Reactions  . Penicillins Hives and Rash    Has patient had a PCN reaction causing immediate rash, facial/tongue/throat swelling, SOB or lightheadedness with hypotension: Unknown Has patient had a PCN reaction causing severe rash involving mucus membranes or skin necrosis: Unknown Has patient had a PCN reaction that required hospitalization: No Has patient had a PCN reaction occurring within the last 10 years: No If all of the above answers are "NO", then may proceed with Cephalosporin use.      Patient Measurements: Height: 5\' 8"  (172.7 cm) Weight: 270 lb (122.5 kg) IBW/kg (Calculated) : 68.4 Heparin Dosing Weight: 97 kg  Vital Signs: Temp: 97.9 F (36.6 C) (09/29 1347) Temp Source: Oral (09/29 1347) BP: 128/72 (09/29 1347) Pulse Rate: 62 (09/29 1347)  Labs: Recent Labs    03/07/19 1416 03/07/19 1611  HGB 14.8  --   HCT 44.9  --   PLT 153  --   CREATININE 0.80  --   TROPONINIHS 71* 82*    Estimated Creatinine Clearance: 117.2 mL/min (by C-G formula based on SCr of 0.8 mg/dL).   Medical History: Past Medical History:  Diagnosis Date  . Atrial fibrillation ([place])    a. dx 10/2017. b. s/p DCCV on 11/19/2017 with successful conversion to NSR but back in AFIB 3 weeks later  . CAD (coronary artery disease)    a. s/p PCI to LAD 2001. b. s/p stenting to the RCA and mid LAD December 2011, residual distal RCA disease treated medically. b. nuc 02/2013 abnormal with fixed defect seen in inferoapical, mid-inferior, and basal inferior walls, indicative of myocardial scar, no evidence of ischemia, EF 41% (Ef 55-60% by echo same time).  . Carotid artery disease ([place])    carotid Doppler course left side 5069% stenosis  . Dysrhythmia    AFib  . Habitual alcohol use   . Hyperlipidemia   . Hypertension   . Low back pain   . OSA on CPAP   . Osteoarthritis   .  Polysubstance abuse ([place])    use including marijuana and vicodin,which he obtained from the street  . PVC's (premature ventricular contractions)    status post Holter monitor normal sinus rhythm otherwise asymptomatic PVCs.  . Seasonal allergies     Medications:  (Not in a hospital admission)   Assessment: Pharmacy consulted to dose heparin in patient with ACS/STEMI.  Patient is on Eliquis prior to admission with last dose given 9/29 0630. Will need to dose based off of aPTT until correlation with heparin levels.  Goal of Therapy:  APTT 66-102 sec Heparin level 0.3-0.7 units/ml Monitor platelets by anticoagulation protocol: Yes   Plan:  Start heparin infusion at  1300 units/hr Check anti-Xa level in 6-8 hours and daily while on heparin. Continue to monitor H&H and platelets  Margot Ables, PharmD Clinical Pharmacist 03/07/2019 6:27 PM

## 2019-03-07 NOTE — ED Provider Notes (Addendum)
[facility] Emergency Department Provider Note MRN:  [id number]  Arrival date & time: 03/07/19     Chief Complaint   Chest Pain   History of Present Illness   Martin [name] is a 63 y.o. year-old male with a history of CAD status post stent placement presenting to the ED with chief complaint of chest pain.  1 week of intermittent chest pain, described as a pressure in the center of the chest, seems to happen more frequently in the morning when he first wakes up and starts walking.  Associated with mild shortness of breath.  Denies dizziness or diaphoresis, no nausea or vomiting.  Has not had a stress test or catheterization since his stents were put in in 2011.  Review of Systems  A complete 10 system review of systems was obtained and all systems are negative except as noted in the HPI and PMH.   Patient's Health History    Past Medical History:  Diagnosis Date  . Atrial fibrillation ([place])    a. dx 10/2017. b. s/p DCCV on 11/19/2017 with successful conversion to NSR but back in AFIB 3 weeks later  . CAD (coronary artery disease)    a. s/p PCI to LAD 2001. b. s/p stenting to the RCA and mid LAD December 2011, residual distal RCA disease treated medically. b. nuc 02/2013 abnormal with fixed defect seen in inferoapical, mid-inferior, and basal inferior walls, indicative of myocardial scar, no evidence of ischemia, EF 41% (Ef 55-60% by echo same time).  . Carotid artery disease ([place])    carotid Doppler course left side 5069% stenosis  . Dysrhythmia    AFib  . Habitual alcohol use   . Hyperlipidemia   . Hypertension   . Low back pain   . OSA on CPAP   . Osteoarthritis   . Polysubstance abuse ([place])    use including marijuana and vicodin,which he obtained from the street  . PVC's (premature ventricular contractions)    status post Holter monitor normal sinus rhythm otherwise asymptomatic PVCs.  . Seasonal allergies     Past Surgical History:  Procedure  Laterality Date  . CARDIOVERSION N/A 11/19/2017   Procedure: CARDIOVERSION;  Surgeon: Herminio Commons, MD;  Location: [facility];  Service: Cardiovascular;  Laterality: N/A;  . CORONARY ANGIOPLASTY WITH STENT PLACEMENT  2011  . TONSILLECTOMY      Family History  Problem Relation Age of Onset  . Stroke Mother   . Other Mother        small bowel obstruction  . Heart attack Father   . Heart attack Brother     Social History   Socioeconomic History  . Marital status: Married    Spouse name: Not on file  . Number of children: Not on file  . Years of education: Not on file  . Highest education level: Not on file  Occupational History    Employer: UNEMPLOYED    Comment: works Social worker employed  Social Needs  . Financial resource strain: Not on file  . Food insecurity    Worry: Not on file    Inability: Not on file  . Transportation needs    Medical: Not on file    Non-medical: Not on file  Tobacco Use  . Smoking status: Former Smoker    Packs/day: 1.00    Years: 35.00    Pack years: 35.00    Types: Cigarettes    Start date: 09/18/1967    Quit date: 06/09/1999  Years since quitting: 19.7  . Smokeless tobacco: Never Used  Substance and Sexual Activity  . Alcohol use: Yes    Alcohol/week: 42.0 standard drinks    Types: 42 Shots of liquor per week    Comment: 1/2 gallon og liquor per week  . Drug use: Yes    Types: Marijuana    Comment: smokes marijuana occassionally-last smoked few weeks ago  . Sexual activity: Yes    Birth control/protection: None  Lifestyle  . Physical activity    Days per week: Not on file    Minutes per session: Not on file  . Stress: Not on file  Relationships  . Social Herbalist on phone: Not on file    Gets together: Not on file    Attends religious service: Not on file    Active member of club or organization: Not on file    Attends meetings of clubs or organizations: Not on file    Relationship status: Not on  file  . Intimate partner violence    Fear of current or ex partner: Not on file    Emotionally abused: Not on file    Physically abused: Not on file    Forced sexual activity: Not on file  Other Topics Concern  . Not on file  Social History Narrative   No exercise     Physical Exam  Vital Signs and Nursing Notes reviewed Vitals:   03/07/19 1347  BP: 128/72  Pulse: 62  Temp: 97.9 F (36.6 C)  SpO2: 96%    CONSTITUTIONAL: Well-appearing, NAD NEURO:  Alert and oriented x 3, no focal deficits EYES:  eyes equal and reactive ENT/NECK:  no LAD, no JVD CARDIO: Regular rate, well-perfused, normal S1 and S2 PULM:  CTAB no wheezing or rhonchi GI/GU:  normal bowel sounds, non-distended, non-tender MSK/SPINE:  No gross deformities, no edema SKIN:  no rash, atraumatic PSYCH:  Appropriate speech and behavior  Diagnostic and Interventional Summary    EKG Interpretation  Date/Time:  Tuesday March 07 2019 13:41:06 EDT Ventricular Rate:  61 PR Interval:  206 QRS Duration: 110 QT Interval:  406 QTC Calculation: 408 R Axis:   -36 Text Interpretation:  Sinus rhythm with sinus arrhythmia with occasional Premature ventricular complexes Left axis deviation ST & T wave abnormality, consider lateral ischemia Abnormal ECG nonspecific st/ts compared with prior 9/19 Confirmed by Aletta Edouard [id number]) on 03/07/2019 2:21:55 PM      Labs Reviewed  BASIC METABOLIC PANEL - Abnormal; Notable for the following components:      Result Value   Glucose, Bld 131 (*)    All other components within normal limits  CBC - Abnormal; Notable for the following components:   MCV 100.7 (*)    All other components within normal limits  TROPONIN I (HIGH SENSITIVITY) - Abnormal; Notable for the following components:   Troponin I (High Sensitivity) 71 (*)    All other components within normal limits  TROPONIN I (HIGH SENSITIVITY) - Abnormal; Notable for the following components:   Troponin I (High  Sensitivity) 82 (*)    All other components within normal limits  SARS CORONAVIRUS 2 (HOSPITAL ORDER, [facility])  HEPARIN LEVEL (UNFRACTIONATED)  APTT  PROTIME-INR  CBC  HEPARIN LEVEL (UNFRACTIONATED)  APTT    DG Chest 2 View  Final Result      Medications  sodium chloride flush (NS) 0.9 % injection 3 mL (has no administration in time range)  aspirin chewable tablet 324 mg (has no administration in time range)  heparin ADULT infusion 100 units/mL (25000 units/223mL sodium chloride 0.45%) (has no administration in time range)     Procedures Critical Care Critical Care Documentation Critical care time provided by me (excluding procedures): 31 minutes  Condition necessitating critical care: Acute coronary syndrome  Components of critical care management: reviewing of prior records, laboratory and imaging interpretation, frequent re-examination and reassessment of vital signs, administration of aspirin, IV heparin, discussion with consulting services.    ED Course and Medical Decision Making  I have reviewed the triage vital signs and the nursing notes.  Pertinent labs & imaging results that were available during my care of the patient were reviewed by me and considered in my medical decision making (see below for details).  Patient's past medical history makes him high risk, his pressure-like intermittent chest pain is concerning for ACS, his troponin is elevated and rising.  EKG was with nonspecific findings.  Concern for non-ST elevation myocardial infarction, will admit to cardiology.  Patient is currently pain-free.  Dr. Domenic Polite of cardiology is excepting patient, will be transported to [facility].  Barth Kirks. [place] Small, [place] [email or web address]  Final Clinical Impressions(s) / ED Diagnoses     ICD-10-CM   1. NSTEMI (non-ST elevated myocardial infarction) [facility])  I21.4     ED Discharge  Orders    None      Discharge Instructions Discussed with and Provided to Patient: Discharge Instructions   None       Maudie Flakes, MD 03/07/19 1906    Maudie Flakes, MD 03/07/19 [phone]

## 2019-03-07 NOTE — ED Notes (Signed)
[facility] called with patient bed assignment will send transport at this time.

## 2019-03-08 ENCOUNTER — Inpatient Hospital Stay (HOSPITAL_COMMUNITY): Payer: Medicare Other

## 2019-03-08 ENCOUNTER — Encounter (HOSPITAL_COMMUNITY)
Admission: EM | Disposition: A | Discharge status: Rehabilitation Facility | Payer: Self-pay | Source: Home / Self Care | Attending: Cardiothoracic Surgery

## 2019-03-08 ENCOUNTER — Other Ambulatory Visit: Payer: Self-pay | Admitting: *Deleted

## 2019-03-08 ENCOUNTER — Encounter (HOSPITAL_COMMUNITY): Payer: Self-pay | Admitting: Interventional Cardiology

## 2019-03-08 DIAGNOSIS — I2510 Atherosclerotic heart disease of native coronary artery without angina pectoris: Secondary | ICD-10-CM

## 2019-03-08 DIAGNOSIS — I4891 Unspecified atrial fibrillation: Secondary | ICD-10-CM

## 2019-03-08 DIAGNOSIS — I25110 Atherosclerotic heart disease of native coronary artery with unstable angina pectoris: Secondary | ICD-10-CM

## 2019-03-08 HISTORY — PX: LEFT HEART CATH AND CORONARY ANGIOGRAPHY: CATH118249

## 2019-03-08 LAB — CBC
HCT: 45.3 % (ref 39.0–52.0)
Hemoglobin: 15.5 g/dL (ref 13.0–17.0)
MCH: 33.5 pg (ref 26.0–34.0)
MCHC: 34.2 g/dL (ref 30.0–36.0)
MCV: 98.1 fL (ref 80.0–100.0)
Platelets: 141 10*3/uL — ABNORMAL LOW (ref 150–400)
RBC: 4.62 MIL/uL (ref 4.22–5.81)
RDW: 13 % (ref 11.5–15.5)
WBC: 8.2 10*3/uL (ref 4.0–10.5)
nRBC: 0 % (ref 0.0–0.2)

## 2019-03-08 LAB — SURGICAL PCR SCREEN
MRSA, PCR: POSITIVE — AB
Staphylococcus aureus: POSITIVE — AB

## 2019-03-08 LAB — URINALYSIS, ROUTINE W REFLEX MICROSCOPIC
Bilirubin Urine: NEGATIVE
Glucose, UA: NEGATIVE mg/dL
Hgb urine dipstick: NEGATIVE
Ketones, ur: NEGATIVE mg/dL
Leukocytes,Ua: NEGATIVE
Nitrite: NEGATIVE
Protein, ur: NEGATIVE mg/dL
Specific Gravity, Urine: 1.025 (ref 1.005–1.030)
pH: 7 (ref 5.0–8.0)

## 2019-03-08 LAB — APTT
aPTT: 53 seconds — ABNORMAL HIGH (ref 24–36)
aPTT: 65 seconds — ABNORMAL HIGH (ref 24–36)

## 2019-03-08 LAB — BASIC METABOLIC PANEL
Anion gap: 10 (ref 5–15)
BUN: 15 mg/dL (ref 8–23)
CO2: 26 mmol/L (ref 22–32)
Calcium: 9.3 mg/dL (ref 8.9–10.3)
Chloride: 103 mmol/L (ref 98–111)
Creatinine, Ser: 0.81 mg/dL (ref 0.61–1.24)
GFR calc Af Amer: >60 mL/min (ref >60)
GFR calc non Af Amer: >60 mL/min (ref >60)
Glucose, Bld: 112 mg/dL — ABNORMAL HIGH (ref 70–99)
Potassium: 3.8 mmol/L (ref 3.5–5.1)
Sodium: 139 mmol/L (ref 135–145)

## 2019-03-08 LAB — LIPID PANEL
Cholesterol: 170 mg/dL (ref 0–200)
HDL: 41 mg/dL (ref >40)
LDL Cholesterol: 85 mg/dL (ref 0–99)
Total CHOL/HDL Ratio: 4.1 RATIO
Triglycerides: 218 mg/dL — ABNORMAL HIGH (ref <150)
VLDL: 44 mg/dL — ABNORMAL HIGH (ref 0–40)

## 2019-03-08 LAB — HEPARIN LEVEL (UNFRACTIONATED): Heparin Unfractionated: 0.84 IU/mL — ABNORMAL HIGH (ref 0.30–0.70)

## 2019-03-08 LAB — TROPONIN I (HIGH SENSITIVITY)
Troponin I (High Sensitivity): 69 ng/L — ABNORMAL HIGH (ref <18)
Troponin I (High Sensitivity): 57 ng/L — ABNORMAL HIGH (ref <18)

## 2019-03-08 LAB — ECHOCARDIOGRAM COMPLETE
Height: 68 in
Weight: 4294.4 oz

## 2019-03-08 LAB — HIV ANTIBODY (ROUTINE TESTING W REFLEX): HIV Screen 4th Generation wRfx: NONREACTIVE

## 2019-03-08 LAB — TSH: TSH: 6.268 u[IU]/mL — ABNORMAL HIGH (ref 0.350–4.500)

## 2019-03-08 LAB — BRAIN NATRIURETIC PEPTIDE: B Natriuretic Peptide: 320.2 pg/mL — ABNORMAL HIGH (ref 0.0–100.0)

## 2019-03-08 SURGERY — LEFT HEART CATH AND CORONARY ANGIOGRAPHY
Anesthesia: LOCAL

## 2019-03-08 MED ORDER — SODIUM CHLORIDE 0.9% FLUSH: 3.0000 mL | Freq: Two times a day (BID) | INTRAVENOUS | Status: DC

## 2019-03-08 MED ORDER — VERAPAMIL HCL 2.5 MG/ML IV SOLN
INTRAVENOUS | Status: AC
  Filled 2019-03-08: qty 2

## 2019-03-08 MED ORDER — FENTANYL CITRATE (PF) 100 MCG/2ML IJ SOLN
INTRAMUSCULAR | Status: DC | PRN
  Administered 2019-03-08: 25 ug via INTRAVENOUS

## 2019-03-08 MED ORDER — PERFLUTREN LIPID MICROSPHERE
1.0000 mL | INTRAVENOUS | Status: AC | PRN
  Administered 2019-03-08: 4 mL via INTRAVENOUS
  Filled 2019-03-08: qty 10

## 2019-03-08 MED ORDER — ZOLPIDEM TARTRATE 5 MG PO TABS
5.0000 mg | ORAL_TABLET | Freq: Once | ORAL | Status: AC
  Administered 2019-03-08: 5 mg via ORAL
  Filled 2019-03-08: qty 1

## 2019-03-08 MED ORDER — NITROGLYCERIN 1 MG/10 ML FOR IR/CATH LAB
INTRA_ARTERIAL | Status: DC | PRN
  Administered 2019-03-08: 200 ug via INTRACORONARY

## 2019-03-08 MED ORDER — NITROGLYCERIN 1 MG/10 ML FOR IR/CATH LAB
INTRA_ARTERIAL | Status: AC
  Filled 2019-03-08: qty 10

## 2019-03-08 MED ORDER — MIDAZOLAM HCL 2 MG/2ML IJ SOLN
INTRAMUSCULAR | Status: AC
  Filled 2019-03-08: qty 2

## 2019-03-08 MED ORDER — LABETALOL HCL 5 MG/ML IV SOLN: 10.0000 mg | INTRAVENOUS | Status: AC | PRN

## 2019-03-08 MED ORDER — ACETAMINOPHEN 325 MG PO TABS: 650.0000 mg | ORAL_TABLET | ORAL | Status: DC | PRN

## 2019-03-08 MED ORDER — HEPARIN (PORCINE) IN NACL 1000-0.9 UT/500ML-% IV SOLN
INTRAVENOUS | Status: DC | PRN
  Administered 2019-03-08 (×2): 500 mL

## 2019-03-08 MED ORDER — CHLORHEXIDINE GLUCONATE CLOTH 2 % EX PADS
6.0000 | MEDICATED_PAD | Freq: Every day | CUTANEOUS | Status: DC
  Administered 2019-03-09: 6 via TOPICAL

## 2019-03-08 MED ORDER — HEPARIN SODIUM (PORCINE) 1000 UNIT/ML IJ SOLN
INTRAMUSCULAR | Status: AC
  Filled 2019-03-08: qty 1

## 2019-03-08 MED ORDER — MUPIROCIN 2 % EX OINT
1.0000 "application " | TOPICAL_OINTMENT | Freq: Two times a day (BID) | CUTANEOUS | Status: DC
  Administered 2019-03-08 – 2019-03-09 (×3): 1 via NASAL
  Filled 2019-03-08: qty 22

## 2019-03-08 MED ORDER — MIDAZOLAM HCL 2 MG/2ML IJ SOLN
INTRAMUSCULAR | Status: DC | PRN
  Administered 2019-03-08 (×2): 1 mg via INTRAVENOUS

## 2019-03-08 MED ORDER — FENTANYL CITRATE (PF) 100 MCG/2ML IJ SOLN
INTRAMUSCULAR | Status: AC
  Filled 2019-03-08: qty 2

## 2019-03-08 MED ORDER — LIDOCAINE HCL (PF) 1 % IJ SOLN
INTRAMUSCULAR | Status: AC
  Filled 2019-03-08: qty 30

## 2019-03-08 MED ORDER — NITROGLYCERIN IN D5W 200-5 MCG/ML-% IV SOLN
INTRAVENOUS | Status: AC | PRN
  Administered 2019-03-08: 20 ug/min via INTRAVENOUS

## 2019-03-08 MED ORDER — HEPARIN SODIUM (PORCINE) 1000 UNIT/ML IJ SOLN
INTRAMUSCULAR | Status: DC | PRN
  Administered 2019-03-08: 5000 [IU] via INTRAVENOUS

## 2019-03-08 MED ORDER — FLUTICASONE PROPIONATE 50 MCG/ACT NA SUSP
1.0000 | Freq: Every day | NASAL | Status: DC
  Administered 2019-03-08 – 2019-03-27 (×19): 1 via NASAL
  Filled 2019-03-08: qty 16

## 2019-03-08 MED ORDER — ATORVASTATIN CALCIUM 80 MG PO TABS
80.0000 mg | ORAL_TABLET | Freq: Every day | ORAL | Status: DC
  Administered 2019-03-08 – 2019-03-26 (×19): 80 mg via ORAL
  Filled 2019-03-08 (×19): qty 1

## 2019-03-08 MED ORDER — LIDOCAINE HCL (PF) 1 % IJ SOLN
INTRAMUSCULAR | Status: DC | PRN
  Administered 2019-03-08: 2 mL via INTRADERMAL

## 2019-03-08 MED ORDER — SODIUM CHLORIDE 0.9% FLUSH
3.0000 mL | INTRAVENOUS | Status: DC | PRN
  Administered 2019-03-10: 3 mL via INTRAVENOUS
  Filled 2019-03-08: qty 3

## 2019-03-08 MED ORDER — IOHEXOL 350 MG/ML SOLN
INTRAVENOUS | Status: DC | PRN
  Administered 2019-03-08: 115 mL

## 2019-03-08 MED ORDER — NITROGLYCERIN IN D5W 200-5 MCG/ML-% IV SOLN
INTRAVENOUS | Status: AC
  Filled 2019-03-08: qty 250

## 2019-03-08 MED ORDER — HEPARIN (PORCINE) 25000 UT/250ML-% IV SOLN
1700.0000 [IU]/h | INTRAVENOUS | Status: DC
  Administered 2019-03-08 – 2019-03-10 (×3): 1700 [IU]/h via INTRAVENOUS
  Filled 2019-03-08 (×3): qty 250

## 2019-03-08 MED ORDER — SODIUM CHLORIDE 0.9 % IV SOLN
250.0000 mL | INTRAVENOUS | Status: DC | PRN
  Administered 2019-03-10: 08:00:00 via INTRAVENOUS

## 2019-03-08 MED ORDER — METOPROLOL TARTRATE 25 MG PO TABS
25.0000 mg | ORAL_TABLET | Freq: Two times a day (BID) | ORAL | Status: DC
  Administered 2019-03-08 – 2019-03-09 (×3): 25 mg via ORAL
  Filled 2019-03-08 (×5): qty 1

## 2019-03-08 MED ORDER — NITROGLYCERIN IN D5W 200-5 MCG/ML-% IV SOLN
0.0000 ug/min | INTRAVENOUS | Status: DC
  Administered 2019-03-08: 20 ug/min via INTRAVENOUS

## 2019-03-08 MED ORDER — SODIUM CHLORIDE 0.9 % WEIGHT BASED INFUSION
0.6000 mL/kg/h | INTRAVENOUS | Status: DC
  Administered 2019-03-08: 14:00:00 0.6 mL/kg/h via INTRAVENOUS

## 2019-03-08 MED ORDER — VERAPAMIL HCL 2.5 MG/ML IV SOLN
INTRAVENOUS | Status: DC | PRN
  Administered 2019-03-08: 13:00:00 10 mL via INTRA_ARTERIAL

## 2019-03-08 MED ORDER — HYDRALAZINE HCL 20 MG/ML IJ SOLN: 10.0000 mg | INTRAMUSCULAR | Status: AC | PRN

## 2019-03-08 MED ORDER — ONDANSETRON HCL 4 MG/2ML IJ SOLN: 4.0000 mg | Freq: Four times a day (QID) | INTRAMUSCULAR | Status: DC | PRN

## 2019-03-08 MED ORDER — HEPARIN (PORCINE) IN NACL 1000-0.9 UT/500ML-% IV SOLN
INTRAVENOUS | Status: AC
  Filled 2019-03-08: qty 1000

## 2019-03-08 SURGICAL SUPPLY — 10 items
CATH 5FR JL3.5 JR4 ANG PIG MP (CATHETERS) ×2 IMPLANT
CATH VISTA GUIDE 6FR XB3.5 (CATHETERS) ×2 IMPLANT
DEVICE RAD COMP TR BAND LRG (VASCULAR PRODUCTS) ×1 IMPLANT
GLIDESHEATH SLEND A-KIT 6F 22G (SHEATH) ×2 IMPLANT
GUIDEWIRE INQWIRE 1.5J.035X260 (WIRE) IMPLANT
INQWIRE 1.5J .035X260CM (WIRE) ×2
KIT HEART LEFT (KITS) ×2 IMPLANT
PACK CARDIAC CATHETERIZATION (CUSTOM PROCEDURE TRAY) ×2 IMPLANT
TRANSDUCER W/STOPCOCK (MISCELLANEOUS) ×2 IMPLANT
TUBING CIL FLEX 10 FLL-RA (TUBING) ×2 IMPLANT

## 2019-03-08 NOTE — Plan of Care (Signed)

## 2019-03-08 NOTE — Progress Notes (Signed)
[place] for heparin Indication: ACS/STEMI  Allergies  Allergen Reactions  . Penicillins Hives and Rash    Has patient had a PCN reaction causing immediate rash, facial/tongue/throat swelling, SOB or lightheadedness with hypotension: Unknown Has patient had a PCN reaction causing severe rash involving mucus membranes or skin necrosis: Unknown Has patient had a PCN reaction that required hospitalization: No Has patient had a PCN reaction occurring within the last 10 years: No If all of the above answers are "NO", then may proceed with Cephalosporin use.      Patient Measurements: Height: 5\' 8"  (172.7 cm) Weight: 268 lb 6.4 oz (121.7 kg)(scale c) IBW/kg (Calculated) : 68.4 Heparin Dosing Weight: 97 kg  Vital Signs: Temp: 98.6 F (37 C) (09/30 0437) Temp Source: Oral (09/30 0437) BP: 135/79 (09/30 0437) Pulse Rate: 53 (09/30 0437)  Labs: Recent Labs    03/07/19 1416 03/07/19 1611 03/07/19 1828 03/07/19 2358 03/08/19 0217 03/08/19 0357  HGB 14.8  --   --  15.5  --   --   HCT 44.9  --   --  45.3  --   --   PLT 153  --   --  141*  --   --   APTT  --   --  40*  --   --  53*  LABPROT  --   --  13.4  --   --   --   INR  --   --  1.0  --   --   --   HEPARINUNFRC  --   --  1.47*  --  0.84*  --   CREATININE 0.80  --   --  0.81  --   --   TROPONINIHS 71* 82*  --  69* 57*  --     Estimated Creatinine Clearance: 115.4 mL/min (by C-G formula based on SCr of 0.81 mg/dL).  Assessment: 63 y.o. male admitted with chest pain, h/o Afib and Eliquis on hold, for heparin.  APTT low this morning  Goal of Therapy:  APTT 66-102 sec Heparin level 0.3-0.7 units/ml Monitor platelets by anticoagulation protocol: Yes   Plan:  Increase Heparin 1600 units/hr Follow-up am labs.   Phillis Knack, PharmD, BCPS 03/08/2019 6:45 AM

## 2019-03-08 NOTE — H&P (View-Only) (Signed)
Progress Note  Patient Name: Martin [name] Date of Encounter: 03/08/2019  Primary Cardiologist: Kate Sable, MD   Subjective   Denies any chest pain overnight  Inpatient Medications    Scheduled Meds: . allopurinol  300 mg Oral q morning - 10a  . aspirin  324 mg Oral NOW   Or  . aspirin  300 mg Rectal NOW  . [START ON 03/09/2019] aspirin EC  81 mg Oral Daily  . atorvastatin  80 mg Oral q1800  . diltiazem  120 mg Oral Daily  . folic acid  1 mg Oral Daily  . influenza vaccine adjuvanted  0.5 mL Intramuscular Tomorrow-1000  . lisinopril  40 mg Oral Daily  . magnesium oxide  400 mg Oral QHS  . omega-3 acid ethyl esters  2 g Oral BID  . sodium chloride flush  3 mL Intravenous Once  . sodium chloride flush  3 mL Intravenous Q12H  . triamterene-hydrochlorothiazide  1 tablet Oral q morning - 10a  . vitamin B-12  2,500 mcg Oral Daily   Continuous Infusions: . sodium chloride    . sodium chloride 1 mL/kg/hr (03/08/19 0544)  . heparin 1,600 Units/hr (03/08/19 0710)   PRN Meds: sodium chloride, acetaminophen, nitroGLYCERIN, ondansetron (ZOFRAN) IV, perflutren lipid microspheres (DEFINITY) IV suspension, sodium chloride flush   Vital Signs    Vitals:   03/07/19 2247 03/08/19 0159 03/08/19 0437 03/08/19 0832  BP: (!) 182/105  135/79 (!) 161/84  Pulse: 60 69 (!) 53 (!) 59  Resp: 18 16 20 20   Temp: 97.8 F (36.6 C)  98.6 F (37 C) 97.7 F (36.5 C)  TempSrc: Oral  Oral Oral  SpO2: 95% 96% 96% 98%  Weight:   121.7 kg   Height:        Intake/Output Summary (Last 24 hours) at 03/08/2019 0952 Last data filed at 03/08/2019 0900 Gross per 24 hour  Intake 344.93 ml  Output 1200 ml  Net -855.07 ml   Last 3 Weights 03/08/2019 03/07/2019 12/13/2018  Weight (lbs) 268 lb 6.4 oz 270 lb 269 lb  Weight (kg) 121.745 kg 122.471 kg 122.018 kg      Telemetry    NRS, rate 50s - Personally Reviewed  ECG    No new - Personally Reviewed  Physical Exam   GEN: No acute  distress.   Neck: No JVD Cardiac: RRR, no murmurs, rubs, or gallops.  Respiratory: Clear to auscultation bilaterally. GI: Soft, nontender, non-distended  MS: No edema; No deformity. Neuro:  Nonfocal  Psych: Normal affect   Labs    High Sensitivity Troponin:   Recent Labs  Lab 03/07/19 1416 03/07/19 1611 03/07/19 2358 03/08/19 0217  TROPONINIHS 71* 82* 69* 57*      Chemistry Recent Labs  Lab 03/07/19 1416 03/07/19 2358  NA 139 139  K 3.8 3.8  CL 105 103  CO2 26 26  GLUCOSE 131* 112*  BUN 21 15  CREATININE 0.80 0.81  CALCIUM 9.6 9.3  GFRNONAA >60 >60  GFRAA >60 >60  ANIONGAP 8 10     Hematology Recent Labs  Lab 03/07/19 1416 03/07/19 2358  WBC 7.3 8.2  RBC 4.46 4.62  HGB 14.8 15.5  HCT 44.9 45.3  MCV 100.7* 98.1  MCH 33.2 33.5  MCHC 33.0 34.2  RDW 13.0 13.0  PLT 153 141*    BNP Recent Labs  Lab 03/07/19 2358  BNP 320.2*     DDimer No results for input(s): DDIMER in the last 168 hours.  Radiology    Dg Chest 2 View  Result Date: 03/07/2019 CLINICAL DATA:  Chest pain for 2 weeks. EXAM: CHEST - 2 VIEW COMPARISON:  10/28/2017 and prior radiographs FINDINGS: Cardiomegaly and LEFT basilar scarring again noted. There is no evidence of focal airspace disease, pulmonary edema, suspicious pulmonary nodule/mass, pleural effusion, or pneumothorax. No acute bony abnormalities are identified. IMPRESSION: Cardiomegaly without evidence of acute cardiopulmonary disease. Electronically Signed   By: Margarette Canada M.D.   On: 03/07/2019 14:30    Cardiac Studies     Patient Profile     63 y.o. male with CAD s/p LAD PCI 2001, LAD/RCA PCI 2011, paroxysmal atrial fibrillation who presents with chest pain  Assessment & Plan    CAD: s/p LAD PCI 2001, LAD/RCA PCI 2011. He presents with chest pressure resolving with NTG.  ECG abnormal but unchanged from prior.  Hs-TnI with an equivocal mild elevation with delta < 20.  However, based on history and overall findings,  concerned for NSTEMI - Hold Eliquis (last dose 6:30AM 9/29) and start heparin gtt.  - ASA 81 - Atorvastatin 80 daily - Plan cath today - TTE  HTN: Continue home triamterene/HCTZ , diltiazem, and lisinopril  Atrial fibrillation: He is in NSR (versus ectopic atrial rhythm with abnormal P axis) today.   - Hold Eliquis for cath, restart afterwards.    For questions or updates, please contact [place] Please consult [email or web address] for contact info under        Signed, Donato Heinz, MD  03/08/2019, 9:52 AM

## 2019-03-08 NOTE — Consult Note (Signed)
[address]       [place] [address]             [phone]        Martin [name] [facility] Medical Record [id number] Date of Birth: 06/14/1955  Referring: No ref. provider found Primary Care: [name], MD Primary Cardiologist:Suresh Bronson Ing, MD  Chief Complaint:    Chief Complaint  Patient presents with   Chest Pain  Patient examined, images of coronary arteriogram and 2D echocardiogram and chest x-ray personally reviewed and discussed with patient and wife  History of Present Illness:      63 year old obese nondiabetic male with history of previous PCI of the RCA and LAD approximately 10 years ago presents with unstable angina and mildly elevated troponin consistent with non-STEMI.  Currently his chest pain is controlled with IV nitroglycerin.  He underwent urgent cardiac catheterization via right radial artery by Dr. Tamala Julian which shows severe three-vessel coronary disease-chronic occlusion of the RCA with collateralization of the posterior descending, 90% ostial stenosis of a large ramus intermediate, 80% stenosis of proximal LAD with a graftable distal vessel.  The circumflex is small nondominant and with a 90% proximal stenosis.  LV function appears to be fairly well-preserved on echocardiogram with some inferior hypokinesia. There is mild aortic stenosis of a tricuspid valve.  No significant MR, TR.  The patient was diagnosed with atrial fibrillation and started on Eliquis in May 2019 but states he has been in sinus rhythm for the past year.  No significant complications from Eliquis.  Last dose of Eliquis was 36 hours ago.  Current Activity/ Functional Status: Patient is married, works full-time as a Museum/gallery curator.   Zubrod Score: At the time of surgery this patients most appropriate activity status/level should be described as: []     0    Normal activity, no symptoms []     1    Restricted in physical strenuous activity but  ambulatory, able to do out light work []     2    Ambulatory and capable of self care, unable to do work activities, up and about                 more than 50%  Of the time                            []     3    Only limited self care, in bed greater than 50% of waking hours []     4    Completely disabled, no self care, confined to bed or chair []     5    Moribund  Past Medical History:  Diagnosis Date   Atrial fibrillation ([facility])    a. dx 10/2017. b. s/p DCCV on 11/19/2017 with successful conversion to NSR but back in AFIB 3 weeks later   CAD (coronary artery disease)    a. s/p PCI to LAD 2001. b. s/p stenting to the RCA and mid LAD December 2011, residual distal RCA disease treated medically. b. nuc 02/2013 abnormal with fixed defect seen in inferoapical, mid-inferior, and basal inferior walls, indicative of myocardial scar, no evidence of ischemia, EF 41% (Ef 55-60% by echo same time).   Carotid artery disease (HCC)    carotid Doppler course left side 5069% stenosis   Dysrhythmia    AFib   Habitual alcohol use    Hyperlipidemia  Hypertension    Low back pain    OSA on CPAP    Osteoarthritis    Polysubstance abuse (HCC)    use including marijuana and vicodin,which he obtained from the street   PVC's (premature ventricular contractions)    status post Holter monitor normal sinus rhythm otherwise asymptomatic PVCs.   Seasonal allergies     Past Surgical History:  Procedure Laterality Date   CARDIOVERSION N/A 11/19/2017   Procedure: CARDIOVERSION;  Surgeon: Herminio Commons, MD;  [facility];  Service: Cardiovascular;  Laterality: N/A;   CORONARY ANGIOPLASTY WITH STENT PLACEMENT  2011   LEFT HEART CATH AND CORONARY ANGIOGRAPHY N/A 03/08/2019   Procedure: LEFT HEART CATH AND CORONARY ANGIOGRAPHY;  Surgeon: Belva Crome, MD;  Location: [facility];  Service: Cardiovascular;  Laterality: N/A;   TONSILLECTOMY      Social History   Tobacco Use    Smoking Status Former Smoker   Packs/day: 1.00   Years: 35.00   Pack years: 35.00   Types: Cigarettes   Start date: 09/18/1967   Quit date: 06/09/1999   Years since quitting: 19.7  Smokeless Tobacco Never Used    Social History   Substance and Sexual Activity  Alcohol Use Yes   Alcohol/week: 42.0 standard drinks   Types: 42 Shots of liquor per week   Comment: 1/2 gallon og liquor per week     Allergies  Allergen Reactions   Penicillins Hives and Rash    Has patient had a PCN reaction causing immediate rash, facial/tongue/throat swelling, SOB or lightheadedness with hypotension: Unknown Has patient had a PCN reaction causing severe rash involving mucus membranes or skin necrosis: Unknown Has patient had a PCN reaction that required hospitalization: No Has patient had a PCN reaction occurring within the last 10 years: No If all of the above answers are "NO", then may proceed with Cephalosporin use.      Current Facility-Administered Medications  Medication Dose Route Frequency Provider Last Rate Last Dose   0.9 %  sodium chloride infusion  250 mL Intravenous PRN Belva Crome, MD       0.9% sodium chloride infusion  0.6 mL/kg/hr Intravenous Continuous Belva Crome, MD 73 mL/hr at 03/08/19 1415 0.6 mL/kg/hr at 03/08/19 1415   acetaminophen (TYLENOL) tablet 650 mg  650 mg Oral Q4H PRN Belva Crome, MD       allopurinol (ZYLOPRIM) tablet 300 mg  300 mg Oral q morning - 10a Belva Crome, MD   300 mg at 03/08/19 1043   aspirin chewable tablet 324 mg  324 mg Oral NOW Belva Crome, MD       Or   aspirin suppository 300 mg  300 mg Rectal NOW Belva Crome, MD       [START ON 03/09/2019] aspirin EC tablet 81 mg  81 mg Oral Daily Belva Crome, MD       atorvastatin (LIPITOR) tablet 80 mg  80 mg Oral q1800 Belva Crome, MD       fluticasone [facility]) 50 MCG/ACT nasal spray 1 spray  1 spray Each Nare Daily Belva Crome, MD   1 spray at [id number]    folic acid (FOLVITE) tablet 1 mg  1 mg Oral Daily Belva Crome, MD   1 mg at 03/08/19 1043   heparin ADULT infusion 100 units/mL (25000 units/280mL sodium chloride 0.45%)  1,700 Units/hr Intravenous Continuous Bertis Ruddy, [facility]  hydrALAZINE (APRESOLINE) injection 10 mg  10 mg Intravenous Q20 Min PRN Belva Crome, MD       influenza vaccine adjuvanted (FLUAD) injection 0.5 mL  0.5 mL Intramuscular Tomorrow-1000 Belva Crome, MD       labetalol (NORMODYNE) injection 10 mg  10 mg Intravenous Q10 min PRN Belva Crome, MD       lisinopril (ZESTRIL) tablet 40 mg  40 mg Oral Daily Belva Crome, MD   40 mg at 03/08/19 1043   magnesium oxide (MAG-OX) tablet 400 mg  400 mg Oral QHS Belva Crome, MD   400 mg at 03/08/19 0006   metoprolol tartrate (LOPRESSOR) tablet 25 mg  25 mg Oral BID Belva Crome, MD   25 mg at 03/08/19 1638   nitroGLYCERIN (NITROSTAT) SL tablet 0.4 mg  0.4 mg Sublingual Q5 Min x 3 PRN Belva Crome, MD       nitroGLYCERIN 50 mg in dextrose 5 % 250 mL (0.2 mg/mL) infusion  0-200 mcg/min Intravenous Titrated Belva Crome, MD 6 mL/hr at 03/08/19 1420 20 mcg/min at 03/08/19 1420   omega-3 acid ethyl esters (LOVAZA) capsule 2 g  2 g Oral BID Belva Crome, MD   2 g at 03/08/19 1043   ondansetron (ZOFRAN) injection 4 mg  4 mg Intravenous Q6H PRN Belva Crome, MD       sodium chloride flush (NS) 0.9 % injection 3 mL  3 mL Intravenous Once Belva Crome, MD       sodium chloride flush (NS) 0.9 % injection 3 mL  3 mL Intravenous Q12H Belva Crome, MD       [START ON 03/09/2019] sodium chloride flush (NS) 0.9 % injection 3 mL  3 mL Intravenous Q12H Belva Crome, MD       sodium chloride flush (NS) 0.9 % injection 3 mL  3 mL Intravenous PRN Belva Crome, MD       triamterene-hydrochlorothiazide [facility]) 37.5-25 MG per tablet 1 tablet  1 tablet Oral q morning - 10a Belva Crome, MD   1 tablet at 03/08/19 1045   vitamin B-12 (CYANOCOBALAMIN)  tablet 2,500 mcg  2,500 mcg Oral Daily Belva Crome, MD   2,500 mcg at 03/08/19 1044    Medications Prior to Admission  Medication Sig Dispense Refill Last Dose   allopurinol (ZYLOPRIM) 300 MG tablet Take 300 mg by mouth every morning.    03/07/2019 at Unknown time   apixaban (ELIQUIS) 5 MG TABS tablet Take 1 tablet (5 mg total) by mouth 2 (two) times daily. 180 tablet 1 03/07/2019 at 630   atorvastatin (LIPITOR) 80 MG tablet Take 1 tablet (80 mg total) by mouth daily. (Patient taking differently: Take 80 mg by mouth every evening. ) 90 tablet 2 03/06/2019 at Unknown time   Cholecalciferol (VITAMIN D-3) 1000 UNITS CAPS Take 1,000 Units by mouth every morning.    03/07/2019 at Unknown time   Cyanocobalamin (VITAMIN B-12) 2500 MCG SUBL Place 2,500 mcg under the tongue every morning.    03/07/2019 at Unknown time   diclofenac (VOLTAREN) 75 MG EC tablet Take 75 mg by mouth 2 (two) times daily.   03/07/2019 at Unknown time   diltiazem (CARDIZEM CD) 120 MG 24 hr capsule TAKE 1 CAPSULE BY MOUTH EVERY DAY (Patient taking differently: Take 120 mg by mouth every morning. ) 90 capsule 2 03/07/2019 at Unknown time   fluticasone (FLONASE) 50 MCG/ACT nasal spray Place  2 sprays into the nose daily.     03/07/2019 at Unknown time   folic acid (FOLVITE) 1 MG tablet TAKE ONE TABLET BY MOUTH EVERY DAY (Patient taking differently: Take 1 mg by mouth every morning. ) 30 tablet 6 03/07/2019 at Unknown time   lisinopril (PRINIVIL,ZESTRIL) 40 MG tablet TAKE 1 TABLET BY MOUTH EVERY DAY (Patient taking differently: Take 40 mg by mouth every morning. ) 90 tablet 1 03/07/2019 at Unknown time   loratadine (CLARITIN) 10 MG tablet Take 10 mg by mouth every morning.    03/07/2019 at Unknown time   magnesium oxide (MAG-OX) 400 MG tablet Take 400 mg by mouth at bedtime.    03/06/2019 at Unknown time   meclizine (ANTIVERT) 32 MG tablet Take 32 mg by mouth daily as needed for dizziness.    UNKNOWN   nitroGLYCERIN (NITROSTAT) 0.4  MG SL tablet PLACE 1 TABLET (0.4 MG TOTAL) UNDER THE TONGUE EVERY 5 (FIVE) MINUTES AS NEEDED. (Patient taking differently: Place 0.4 mg under the tongue every 5 (five) minutes as needed for chest pain. ) 25 tablet 3 03/07/2019 at 730   Omega-3 Fatty Acids (FISH OIL) 1200 MG CAPS Take 1,200 mg by mouth 2 (two) times daily.    03/07/2019 at Unknown time   polyethylene glycol (MIRALAX / GLYCOLAX) packet Take 17 g by mouth 3 (three) times a week.    Past Week at Unknown time   triamterene-hydrochlorothiazide (MAXZIDE-25) 37.5-25 MG per tablet Take 1 tablet by mouth every morning.    03/07/2019 at Unknown time    Family History  Problem Relation Age of Onset   Stroke Mother    Other Mother        small bowel obstruction   Heart attack Father    Heart attack Brother      Review of Systems:   ROS No previous thoracic trauma or pneumothorax No previous major surgical operations or anesthesia No previous blood transfusions 2 alcoholic beverages each evening Right-hand-dominant    Cardiac Review of Systems: Y or  [    ]= no  Chest Pain [yes]  Resting SOB [   ] Exertional SOB  [  ]  Orthopnea [  ]   Pedal Edema [   ]    Palpitations [yes] Syncope  [  ]   Presyncope [   ]  General Review of Systems: [Y] = yes [  ]=no Constitional: recent weight change [  ]; anorexia [  ]; fatigue [  ]; nausea [  ]; night sweats [  ]; fever [  ]; or chills [  ]                                                               Dental: Last Dentist visit: 1 year  Eye : blurred vision [  ]; diplopia [   ]; vision changes [  ];  Amaurosis fugax[  ]; Resp: cough [  ];  wheezing[  ];  hemoptysis[  ]; shortness of breath[  ]; paroxysmal nocturnal dyspnea[  ]; dyspnea on exertion[mild]; or orthopnea[  ];  GI:  gallstones[  ], vomiting[  ];  dysphagia[  ]; melena[  ];  hematochezia [  ]; heartburn[  ];   Hx of  Colonoscopy[  ];  GU: kidney stones [  ]; hematuria[  ];   dysuria [  ];  nocturia[  ];  history of      obstruction [  ]; urinary frequency [  ]             Skin: rash, swelling[  ];, hair loss[  ];  peripheral edema[  ];  or itching[  ]; Musculosketetal: myalgias[yes];  joint swelling[  ];  joint erythema[  ];  joint pain[yes];  back pain[  ]; takes Voltaren daily for arthritis  Heme/Lymph: bruising[  ];  bleeding[  ];  anemia[  ];  Neuro: TIA[  ];  headaches[  ];  stroke[  ];  vertigo[  ];  seizures[  ];   paresthesias[  ];  difficulty walking[  ];  Psych:depression[  ]; anxiety[  ];  Endocrine: diabetes[  ];  thyroid dysfunction[  ];               Physical Exam: BP (!) 146/83    Pulse 60    Temp 97.7 F (36.5 C) (Oral)    Resp 16    Ht 5\' 8"  (1.727 m)    Wt 121.7 kg Comment: scale c   SpO2 96%    BMI 40.81 kg/m         Exam    General- alert and comfortable    Neck- no JVD, no cervical adenopathy palpable, no carotid bruit   Lungs- clear without rales, wheezes   Cor- regular rate and rhythm, no murmur , gallop   Abdomen- soft, non-tender   Extremities - warm, non-tender, minimal edema   Neuro- oriented, appropriate, no focal weakness   Diagnostic Studies & Laboratory data:     Recent Radiology Findings:   Dg Chest 2 View  Result Date: 03/07/2019 CLINICAL DATA:  Chest pain for 2 weeks. EXAM: CHEST - 2 VIEW COMPARISON:  10/28/2017 and prior radiographs FINDINGS: Cardiomegaly and LEFT basilar scarring again noted. There is no evidence of focal airspace disease, pulmonary edema, suspicious pulmonary nodule/mass, pleural effusion, or pneumothorax. No acute bony abnormalities are identified. IMPRESSION: Cardiomegaly without evidence of acute cardiopulmonary disease. Electronically Signed   By: Margarette Canada M.D.   On: 03/07/2019 14:30     I have independently reviewed the above radiologic studies and discussed with the patient   Recent Lab Findings: Lab Results  Component Value Date   WBC 8.2 03/07/2019   HGB 15.5 03/07/2019   HCT 45.3 03/07/2019   PLT 141 (L) 03/07/2019    GLUCOSE 112 (H) 03/07/2019   CHOL 170 03/07/2019   TRIG 218 (H) 03/07/2019   HDL 41 03/07/2019   LDLCALC 85 03/07/2019   ALT 35 05/20/2010   AST 38 (H) 05/20/2010   NA 139 03/07/2019   K 3.8 03/07/2019   CL 103 03/07/2019   CREATININE 0.81 03/07/2019   BUN 15 03/07/2019   CO2 26 03/07/2019   TSH 6.268 (H) 03/07/2019   INR 1.0 03/07/2019   HGBA1C (H) 05/20/2010    6.1 (NOTE)                                                                       According to the ADA Clinical Practice Recommendations for 2011,  when HbA1c is used as a screening test:   >=6.5%   Diagnostic of Diabetes Mellitus           (if abnormal result  is confirmed)  5.7-6.4%   Increased risk of developing Diabetes Mellitus  References:Diagnosis and Classification of Diabetes Mellitus,Diabetes [id number] 1):S62-S69 and Standards of Medical Care in         Diabetes - 2011,Diabetes [id number]  (Suppl 1):S11-S61.      Assessment / Plan:   Unstable angina with non-ST elevation MI Severe three-vessel coronary artery disease Mild LV dysfunction History of paroxysmal atrial fibrillation on Eliquis Obesity   Agree with recommendation for surgical coronary revascularization for treatment of his severe three-vessel coronary disease, recent non-STEMI and fairly well preserved LV function.  We will plan surgery on March 09, 2006 30.  Plan for surgery and details of operation discussed with both patient and wife and they understand and agree with this plan.  Pre-CABG Dopplers are pending.  Eliquis has been stopped. @ME1 @ 03/08/2019 5:07 PM

## 2019-03-08 NOTE — Interval H&P Note (Signed)
Cath Lab Visit (complete for each Cath Lab visit)  Clinical Evaluation Leading to the Procedure:   ACS: Yes.    Non-ACS:    Anginal Classification: CCS [name]  Anti-ischemic medical therapy: Minimal Therapy (1 class of medications)  Non-Invasive Test Results: Intermediate-risk stress test findings: cardiac mortality 1-3%/year  Prior CABG: Previous CABG      History and Physical Interval Note:  03/08/2019 12:48 PM  Martin [name]  has presented today for surgery, with the diagnosis of unstable angina.  The various methods of treatment have been discussed with the patient and family. After consideration of risks, benefits and other options for treatment, the patient has consented to  Procedure(s): LEFT HEART CATH AND CORONARY ANGIOGRAPHY (N/A) as a surgical intervention.  The patient's history has been reviewed, patient examined, no change in status, stable for surgery.  I have reviewed the patient's chart and labs.  Questions were answered to the patient's satisfaction.     Martin [name]

## 2019-03-08 NOTE — Progress Notes (Signed)
Progress Note  Patient Name: Martin [name] Date of Encounter: 03/08/2019  Primary Cardiologist: Kate Sable, MD   Subjective   Denies any chest pain overnight  Inpatient Medications    Scheduled Meds: . allopurinol  300 mg Oral q morning - 10a  . aspirin  324 mg Oral NOW   Or  . aspirin  300 mg Rectal NOW  . [START ON 03/09/2019] aspirin EC  81 mg Oral Daily  . atorvastatin  80 mg Oral q1800  . diltiazem  120 mg Oral Daily  . folic acid  1 mg Oral Daily  . influenza vaccine adjuvanted  0.5 mL Intramuscular Tomorrow-1000  . lisinopril  40 mg Oral Daily  . magnesium oxide  400 mg Oral QHS  . omega-3 acid ethyl esters  2 g Oral BID  . sodium chloride flush  3 mL Intravenous Once  . sodium chloride flush  3 mL Intravenous Q12H  . triamterene-hydrochlorothiazide  1 tablet Oral q morning - 10a  . vitamin B-12  2,500 mcg Oral Daily   Continuous Infusions: . sodium chloride    . sodium chloride 1 mL/kg/hr (03/08/19 0544)  . heparin 1,600 Units/hr (03/08/19 0710)   PRN Meds: sodium chloride, acetaminophen, nitroGLYCERIN, ondansetron (ZOFRAN) IV, perflutren lipid microspheres (DEFINITY) IV suspension, sodium chloride flush   Vital Signs    Vitals:   03/07/19 2247 03/08/19 0159 03/08/19 0437 03/08/19 0832  BP: (!) 182/105  135/79 (!) 161/84  Pulse: 60 69 (!) 53 (!) 59  Resp: 18 16 20 20   Temp: 97.8 F (36.6 C)  98.6 F (37 C) 97.7 F (36.5 C)  TempSrc: Oral  Oral Oral  SpO2: 95% 96% 96% 98%  Weight:   121.7 kg   Height:        Intake/Output Summary (Last 24 hours) at 03/08/2019 0952 Last data filed at 03/08/2019 0900 Gross per 24 hour  Intake 344.93 ml  Output 1200 ml  Net -855.07 ml   Last 3 Weights 03/08/2019 03/07/2019 12/13/2018  Weight (lbs) 268 lb 6.4 oz 270 lb 269 lb  Weight (kg) 121.745 kg 122.471 kg 122.018 kg      Telemetry    NRS, rate 50s - Personally Reviewed  ECG    No new - Personally Reviewed  Physical Exam   GEN: No acute  distress.   Neck: No JVD Cardiac: RRR, no murmurs, rubs, or gallops.  Respiratory: Clear to auscultation bilaterally. GI: Soft, nontender, non-distended  MS: No edema; No deformity. Neuro:  Nonfocal  Psych: Normal affect   Labs    High Sensitivity Troponin:   Recent Labs  Lab 03/07/19 1416 03/07/19 1611 03/07/19 2358 03/08/19 0217  TROPONINIHS 71* 82* 69* 57*      Chemistry Recent Labs  Lab 03/07/19 1416 03/07/19 2358  NA 139 139  K 3.8 3.8  CL 105 103  CO2 26 26  GLUCOSE 131* 112*  BUN 21 15  CREATININE 0.80 0.81  CALCIUM 9.6 9.3  GFRNONAA >60 >60  GFRAA >60 >60  ANIONGAP 8 10     Hematology Recent Labs  Lab 03/07/19 1416 03/07/19 2358  WBC 7.3 8.2  RBC 4.46 4.62  HGB 14.8 15.5  HCT 44.9 45.3  MCV 100.7* 98.1  MCH 33.2 33.5  MCHC 33.0 34.2  RDW 13.0 13.0  PLT 153 141*    BNP Recent Labs  Lab 03/07/19 2358  BNP 320.2*     DDimer No results for input(s): DDIMER in the last 168 hours.  Radiology    Dg Chest 2 View  Result Date: 03/07/2019 CLINICAL DATA:  Chest pain for 2 weeks. EXAM: CHEST - 2 VIEW COMPARISON:  10/28/2017 and prior radiographs FINDINGS: Cardiomegaly and LEFT basilar scarring again noted. There is no evidence of focal airspace disease, pulmonary edema, suspicious pulmonary nodule/mass, pleural effusion, or pneumothorax. No acute bony abnormalities are identified. IMPRESSION: Cardiomegaly without evidence of acute cardiopulmonary disease. Electronically Signed   By: Margarette Canada M.D.   On: 03/07/2019 14:30    Cardiac Studies     Patient Profile     63 y.o. male with CAD s/p LAD PCI 2001, LAD/RCA PCI 2011, paroxysmal atrial fibrillation who presents with chest pain  Assessment & Plan    CAD: s/p LAD PCI 2001, LAD/RCA PCI 2011. He presents with chest pressure resolving with NTG.  ECG abnormal but unchanged from prior.  Hs-TnI with an equivocal mild elevation with delta < 20.  However, based on history and overall findings,  concerned for NSTEMI - Hold Eliquis (last dose 6:30AM 9/29) and start heparin gtt.  - ASA 81 - Atorvastatin 80 daily - Plan cath today - TTE  HTN: Continue home triamterene/HCTZ , diltiazem, and lisinopril  Atrial fibrillation: He is in NSR (versus ectopic atrial rhythm with abnormal P axis) today.   - Hold Eliquis for cath, restart afterwards.    For questions or updates, please contact [place] Please consult [email or web address] for contact info under        Signed, Donato Heinz, MD  03/08/2019, 9:52 AM

## 2019-03-08 NOTE — Progress Notes (Signed)
Pt's HR down in the 40's sustained, pt resting in bed. Dr. Paticia Stack notified and verbally ordered to discontinue nitroglycerin drip and hold metoprolol 25mg  PO dose for tonight. Will continue to monitor patient.

## 2019-03-08 NOTE — Progress Notes (Signed)
RT set up CPAP and placed on patient. Patient tolerating well at this time. RT will monitor as needed.  

## 2019-03-08 NOTE — Progress Notes (Signed)
  Echocardiogram 2D Echocardiogram has been performed.  Martin [name] G Martin [name] 03/08/2019, 10:13 AM

## 2019-03-08 NOTE — CV Procedure (Signed)
   Coronary angiography via right radial approach using real-time vascular ultrasound.  Complicated procedure due to significant innominate artery tortuosity making it difficult to advance catheters to the ascending aorta.  Total occlusion of the proximal to mid RCA within previous stented segment.  Right coronary collaterals from left to right.  Widely patent left main  Eccentric 70% ostial LAD.  Segmental 40 to 50% mid LAD.  Mid to distal LAD stent is widely patent.  90% ostial ramus.  Ramus is large.  90% ostial circumflex bifurcation with the ramus.  Acute on chronic combined systolic and diastolic heart failure.  EF 40%.  LVEDP 28 mmHg.  Plan start IV heparin, IV nitroglycerin to help lower LVEDP and blood pressure, and have evaluated by T CTS to consider multivessel coronary bypass grafting.

## 2019-03-08 NOTE — Progress Notes (Signed)
[place] for heparin Indication: ACS/STEMI  Allergies  Allergen Reactions  . Penicillins Hives and Rash    Has patient had a PCN reaction causing immediate rash, facial/tongue/throat swelling, SOB or lightheadedness with hypotension: Unknown Has patient had a PCN reaction causing severe rash involving mucus membranes or skin necrosis: Unknown Has patient had a PCN reaction that required hospitalization: No Has patient had a PCN reaction occurring within the last 10 years: No If all of the above answers are "NO", then may proceed with Cephalosporin use.      Patient Measurements: Height: 5\' 8"  (172.7 cm) Weight: 268 lb 6.4 oz (121.7 kg)(scale c) IBW/kg (Calculated) : 68.4 Heparin Dosing Weight: 97 kg  Vital Signs: Temp: 97.7 F (36.5 C) (09/30 0832) Temp Source: Oral (09/30 0832) BP: 178/100 (09/30 1339) Pulse Rate: 61 (09/30 1339)  Labs: Recent Labs    03/07/19 1416 03/07/19 1611 03/07/19 1828 03/07/19 2358 03/08/19 0217 03/08/19 0357 03/08/19 1221  HGB 14.8  --   --  15.5  --   --   --   HCT 44.9  --   --  45.3  --   --   --   PLT 153  --   --  141*  --   --   --   APTT  --   --  40*  --   --  53* 65*  LABPROT  --   --  13.4  --   --   --   --   INR  --   --  1.0  --   --   --   --   HEPARINUNFRC  --   --  1.47*  --  0.84*  --   --   CREATININE 0.80  --   --  0.81  --   --   --   TROPONINIHS 71* 82*  --  69* 57*  --   --     Estimated Creatinine Clearance: 115.4 mL/min (by C-G formula based on SCr of 0.81 mg/dL).  Assessment: 63 y.o. male admitted with chest pain, h/o Afib and Eliquis on hold, for heparin.  APTT low this morning  Now s/p cath and to re-start heparin gtt 8h post sheath removal  Goal of Therapy:  APTT 66-102 sec Heparin level 0.3-0.7 units/ml Monitor platelets by anticoagulation protocol: Yes   Plan:  Start heparin gtt at 1700 units/hr @2130  F/u 6 hour aPTT/HL  Bertis Ruddy, PharmD Clinical  Pharmacist Please check AMION for all [place] numbers 03/08/2019 2:17 PM

## 2019-03-08 NOTE — Progress Notes (Signed)
Patient wearing CPAP when RT entered room. Patient is tolerating CPAP well at this time. No distress noted. RT will monitor as needed.

## 2019-03-09 ENCOUNTER — Inpatient Hospital Stay (HOSPITAL_COMMUNITY): Payer: Medicare Other

## 2019-03-09 ENCOUNTER — Other Ambulatory Visit: Payer: Self-pay

## 2019-03-09 DIAGNOSIS — I25110 Atherosclerotic heart disease of native coronary artery with unstable angina pectoris: Secondary | ICD-10-CM

## 2019-03-09 DIAGNOSIS — I10 Essential (primary) hypertension: Secondary | ICD-10-CM

## 2019-03-09 DIAGNOSIS — Z01810 Encounter for preprocedural cardiovascular examination: Secondary | ICD-10-CM

## 2019-03-09 DIAGNOSIS — I480 Paroxysmal atrial fibrillation: Secondary | ICD-10-CM

## 2019-03-09 LAB — T4, FREE: Free T4: 0.77 ng/dL (ref 0.61–1.12)

## 2019-03-09 LAB — COMPREHENSIVE METABOLIC PANEL
ALT: 28 U/L (ref 0–44)
AST: 25 U/L (ref 15–41)
Albumin: 3.9 g/dL (ref 3.5–5.0)
Alkaline Phosphatase: 56 U/L (ref 38–126)
Anion gap: 8 (ref 5–15)
BUN: 16 mg/dL (ref 8–23)
CO2: 23 mmol/L (ref 22–32)
Calcium: 9.2 mg/dL (ref 8.9–10.3)
Chloride: 108 mmol/L (ref 98–111)
Creatinine, Ser: 0.87 mg/dL (ref 0.61–1.24)
GFR calc Af Amer: >60 mL/min (ref >60)
GFR calc non Af Amer: >60 mL/min (ref >60)
Glucose, Bld: 118 mg/dL — ABNORMAL HIGH (ref 70–99)
Potassium: 4.2 mmol/L (ref 3.5–5.1)
Sodium: 139 mmol/L (ref 135–145)
Total Bilirubin: 1.1 mg/dL (ref 0.3–1.2)
Total Protein: 6.7 g/dL (ref 6.5–8.1)

## 2019-03-09 LAB — BLOOD GAS, ARTERIAL
Acid-Base Excess: 0.6 mmol/L (ref 0.0–2.0)
Bicarbonate: 24.5 mmol/L (ref 20.0–28.0)
Drawn by: 44135
O2 Saturation: 95.9 %
Patient temperature: 98.6
pCO2 arterial: 37.8 mmHg (ref 32.0–48.0)
pH, Arterial: 7.427 (ref 7.350–7.450)
pO2, Arterial: 80.7 mmHg — ABNORMAL LOW (ref 83.0–108.0)

## 2019-03-09 LAB — PROTIME-INR
INR: 1.2 (ref 0.8–1.2)
Prothrombin Time: 14.7 seconds (ref 11.4–15.2)

## 2019-03-09 LAB — HEPARIN LEVEL (UNFRACTIONATED): Heparin Unfractionated: 0.84 IU/mL — ABNORMAL HIGH (ref 0.30–0.70)

## 2019-03-09 LAB — APTT
aPTT: 86 seconds — ABNORMAL HIGH (ref 24–36)
aPTT: 77 seconds — ABNORMAL HIGH (ref 24–36)

## 2019-03-09 LAB — PULMONARY FUNCTION TEST
FEF 25-75 Pre: 1.18 L/sec
FEF2575-%Pred-Pre: 46 %
FEV1-%Pred-Pre: 58 %
FEV1-Pre: 1.87 L
FEV1FVC-%Pred-Pre: 94 %
FEV6-%Pred-Pre: 64 %
FEV6-Pre: 2.6 L
FEV6FVC-%Pred-Pre: 103 %
FVC-%Pred-Pre: 61 %
FVC-Pre: 2.64 L
Pre FEV1/FVC ratio: 71 %
Pre FEV6/FVC Ratio: 98 %

## 2019-03-09 LAB — CBC
HCT: 44.4 % (ref 39.0–52.0)
Hemoglobin: 14.9 g/dL (ref 13.0–17.0)
MCH: 33.3 pg (ref 26.0–34.0)
MCHC: 33.6 g/dL (ref 30.0–36.0)
MCV: 99.3 fL (ref 80.0–100.0)
Platelets: 159 10*3/uL (ref 150–400)
RBC: 4.47 MIL/uL (ref 4.22–5.81)
RDW: 13.2 % (ref 11.5–15.5)
WBC: 7.3 10*3/uL (ref 4.0–10.5)
nRBC: 0 % (ref 0.0–0.2)

## 2019-03-09 LAB — TSH: TSH: 6.931 u[IU]/mL — ABNORMAL HIGH (ref 0.350–4.500)

## 2019-03-09 LAB — ABO/RH: ABO/RH(D): O POS

## 2019-03-09 LAB — HEMOGLOBIN A1C
Hgb A1c MFr Bld: 5.6 % (ref 4.8–5.6)
Mean Plasma Glucose: 114.02 mg/dL

## 2019-03-09 LAB — PREPARE RBC (CROSSMATCH)

## 2019-03-09 MED ORDER — CHLORHEXIDINE GLUCONATE 4 % EX LIQD
60.0000 mL | Freq: Once | CUTANEOUS | Status: AC
  Administered 2019-03-10: 4 via TOPICAL
  Filled 2019-03-09: qty 60

## 2019-03-09 MED ORDER — DEXMEDETOMIDINE HCL IN NACL 400 MCG/100ML IV SOLN
0.1000 ug/kg/h | INTRAVENOUS | Status: DC
  Filled 2019-03-09: qty 100

## 2019-03-09 MED ORDER — CHLORHEXIDINE GLUCONATE 0.12 % MT SOLN
15.0000 mL | Freq: Once | OROMUCOSAL | Status: AC
  Administered 2019-03-10: 15 mL via OROMUCOSAL
  Filled 2019-03-09: qty 15

## 2019-03-09 MED ORDER — MILRINONE LACTATE IN DEXTROSE 20-5 MG/100ML-% IV SOLN
0.3000 ug/kg/min | INTRAVENOUS | Status: DC
  Filled 2019-03-09: qty 100

## 2019-03-09 MED ORDER — THIAMINE HCL 100 MG/ML IJ SOLN
100.0000 mg | Freq: Every day | INTRAMUSCULAR | Status: DC
  Administered 2019-03-09 – 2019-03-19 (×10): 100 mg via INTRAVENOUS
  Filled 2019-03-09 (×10): qty 2

## 2019-03-09 MED ORDER — LORAZEPAM 1 MG PO TABS
1.0000 mg | ORAL_TABLET | Freq: Two times a day (BID) | ORAL | Status: DC
  Administered 2019-03-09 (×2): 1 mg via ORAL
  Filled 2019-03-09 (×2): qty 1

## 2019-03-09 MED ORDER — ALPRAZOLAM 0.25 MG PO TABS: 0.2500 mg | ORAL_TABLET | ORAL | Status: DC | PRN

## 2019-03-09 MED ORDER — METOPROLOL TARTRATE 12.5 MG HALF TABLET: 12.5000 mg | ORAL_TABLET | Freq: Once | ORAL | Status: DC

## 2019-03-09 MED ORDER — VANCOMYCIN HCL 10 G IV SOLR
1500.0000 mg | INTRAVENOUS | Status: AC
  Administered 2019-03-10: 1500 mg via INTRAVENOUS
  Filled 2019-03-09: qty 1500

## 2019-03-09 MED ORDER — INSULIN REGULAR(HUMAN) IN NACL 100-0.9 UT/100ML-% IV SOLN
INTRAVENOUS | Status: DC
  Filled 2019-03-09: qty 100

## 2019-03-09 MED ORDER — NITROGLYCERIN IN D5W 200-5 MCG/ML-% IV SOLN
2.0000 ug/min | INTRAVENOUS | Status: AC
  Administered 2019-03-10: 08:00:00 10 ug/min via INTRAVENOUS
  Filled 2019-03-09: qty 250

## 2019-03-09 MED ORDER — SPIRITUS FRUMENTI
1.0000 | Freq: Every day | ORAL | Status: DC
  Administered 2019-03-09: 1 via ORAL
  Filled 2019-03-09 (×3): qty 1

## 2019-03-09 MED ORDER — EPINEPHRINE HCL 5 MG/250ML IV SOLN IN NS
0.0000 ug/min | INTRAVENOUS | Status: DC
  Filled 2019-03-09: qty 250

## 2019-03-09 MED ORDER — DOPAMINE-DEXTROSE 3.2-5 MG/ML-% IV SOLN
0.0000 ug/kg/min | INTRAVENOUS | Status: DC
  Filled 2019-03-09: qty 250

## 2019-03-09 MED ORDER — BISACODYL 5 MG PO TBEC
5.0000 mg | DELAYED_RELEASE_TABLET | Freq: Once | ORAL | Status: AC
  Administered 2019-03-09: 5 mg via ORAL
  Filled 2019-03-09: qty 1

## 2019-03-09 MED ORDER — TRANEXAMIC ACID (OHS) BOLUS VIA INFUSION
15.0000 mg/kg | INTRAVENOUS | Status: AC
  Administered 2019-03-10: 08:00:00 1824 mg via INTRAVENOUS
  Filled 2019-03-09: qty 1824

## 2019-03-09 MED ORDER — TRANEXAMIC ACID 1000 MG/10ML IV SOLN
1.5000 mg/kg/h | INTRAVENOUS | Status: AC
  Administered 2019-03-10: 1.5 mg/kg/h via INTRAVENOUS
  Filled 2019-03-09: qty 25

## 2019-03-09 MED ORDER — PLASMA-LYTE 148 IV SOLN
INTRAVENOUS | Status: DC
  Filled 2019-03-09: qty 2.5

## 2019-03-09 MED ORDER — MAGNESIUM SULFATE 50 % IJ SOLN
40.0000 meq | INTRAMUSCULAR | Status: DC
  Filled 2019-03-09: qty 9.85

## 2019-03-09 MED ORDER — CHLORHEXIDINE GLUCONATE 4 % EX LIQD
60.0000 mL | Freq: Once | CUTANEOUS | Status: AC
  Administered 2019-03-09: 4 via TOPICAL
  Filled 2019-03-09: qty 60

## 2019-03-09 MED ORDER — SODIUM CHLORIDE 0.9 % IV SOLN
INTRAVENOUS | Status: DC
  Filled 2019-03-09: qty 30

## 2019-03-09 MED ORDER — PHENYLEPHRINE HCL-NACL 20-0.9 MG/250ML-% IV SOLN
30.0000 ug/min | INTRAVENOUS | Status: DC
  Filled 2019-03-09: qty 250

## 2019-03-09 MED ORDER — TRANEXAMIC ACID (OHS) PUMP PRIME SOLUTION
2.0000 mg/kg | INTRAVENOUS | Status: DC
  Filled 2019-03-09: qty 2.43

## 2019-03-09 MED ORDER — TEMAZEPAM 15 MG PO CAPS: 15.0000 mg | ORAL_CAPSULE | Freq: Once | ORAL | Status: DC | PRN

## 2019-03-09 MED ORDER — POTASSIUM CHLORIDE 2 MEQ/ML IV SOLN
80.0000 meq | INTRAVENOUS | Status: DC
  Filled 2019-03-09: qty 40

## 2019-03-09 MED ORDER — LEVOFLOXACIN IN D5W 500 MG/100ML IV SOLN
500.0000 mg | INTRAVENOUS | Status: AC
  Administered 2019-03-10: 500 mg via INTRAVENOUS
  Filled 2019-03-09: qty 100

## 2019-03-09 NOTE — Progress Notes (Signed)
CARDIAC REHAB PHASE I   Preop education completed with pt. Pt able to demonstrate 2250 on IS. Pt educated on importance of IS use, walks, and sternal precautions after surgery. Pt given in-the-tube sheet, OHS Care guide, and Heart Surgery booklet. Pt admits to having at least 3-4 shots/ evening. Requested RN place pt on CYWA as tomorrow, surgery day, will be day 3 without alcohol. Will continue to follow.  JZ:[id number] Rufina Falco, RN BSN 03/09/2019 11:44 AM

## 2019-03-09 NOTE — Progress Notes (Signed)
[place] for heparin Indication: 3V CAD  Allergies  Allergen Reactions  . Penicillins Hives and Rash    Has patient had a PCN reaction causing immediate rash, facial/tongue/throat swelling, SOB or lightheadedness with hypotension: Unknown Has patient had a PCN reaction causing severe rash involving mucus membranes or skin necrosis: Unknown Has patient had a PCN reaction that required hospitalization: No Has patient had a PCN reaction occurring within the last 10 years: No If all of the above answers are "NO", then may proceed with Cephalosporin use.      Patient Measurements: Height: 5\' 8"  (172.7 cm) Weight: 268 lb (121.6 kg) IBW/kg (Calculated) : 68.4 Heparin Dosing Weight: 97 kg  Vital Signs: Temp: 98.5 F (36.9 C) (10/01 1129) Temp Source: Oral (10/01 1129) BP: 151/97 (10/01 1129) Pulse Rate: 43 (10/01 1129)  Labs: Recent Labs    03/07/19 1416 03/07/19 1611 03/07/19 1828 03/07/19 2358 03/08/19 0217  03/08/19 1221 03/09/19 0430 03/09/19 1033  HGB 14.8  --   --  15.5  --   --   --  14.9  --   HCT 44.9  --   --  45.3  --   --   --  44.4  --   PLT 153  --   --  141*  --   --   --  159  --   APTT  --   --  40*  --   --    < > 65* 86* 77*  LABPROT  --   --  13.4  --   --   --   --  14.7  --   INR  --   --  1.0  --   --   --   --  1.2  --   HEPARINUNFRC  --   --  1.47*  --  0.84*  --   --  0.84*  --   CREATININE 0.80  --   --  0.81  --   --   --  0.87  --   TROPONINIHS 71* 82*  --  69* 57*  --   --   --   --    < > = values in this interval not displayed.    Estimated Creatinine Clearance: 107.4 mL/min (by C-G formula based on SCr of 0.87 mg/dL).  Assessment: 63 y.o. male admitted with chest pain, h/o Afib and Eliquis on hold, for heparin. Cath finding multivessel dx - now undergoing w/u for CABG.   Heparin level this morning came back elevated still at 0.84, confirmatory aPTT is therapeutic at 77. Hgb 14.9, plt 159. No s/sx  of bleeding or infusion issues.   Goal of Therapy:  APTT 66-102 sec Heparin level 0.3-0.7 units/ml Monitor platelets by anticoagulation protocol: Yes   Plan:  Continue heparin gtt at 1700 units/hr Monitor daily HL, CBC, and for s/sx of bleeding  F/U after CABG 10/2   Thank you for involving pharmacy in this patient's care.  Renold Genta, PharmD, BCPS Clinical Pharmacist Clinical phone for 03/09/2019 until 3p is [phone] 03/09/2019 1:01 PM  **Pharmacist phone directory can be found on [email or web address] listed under [place]**

## 2019-03-09 NOTE — Progress Notes (Signed)
[place] for heparin Indication: ACS/STEMI  Allergies  Allergen Reactions  . Penicillins Hives and Rash    Has patient had a PCN reaction causing immediate rash, facial/tongue/throat swelling, SOB or lightheadedness with hypotension: Unknown Has patient had a PCN reaction causing severe rash involving mucus membranes or skin necrosis: Unknown Has patient had a PCN reaction that required hospitalization: No Has patient had a PCN reaction occurring within the last 10 years: No If all of the above answers are "NO", then may proceed with Cephalosporin use.      Patient Measurements: Height: 5\' 8"  (172.7 cm) Weight: 268 lb 6.4 oz (121.7 kg)(scale c) IBW/kg (Calculated) : 68.4 Heparin Dosing Weight: 97 kg  Vital Signs: Temp: 98.2 F (36.8 C) (10/01 0417) Temp Source: Oral (10/01 0417) BP: 137/77 (10/01 0417) Pulse Rate: 50 (10/01 0417)  Labs: Recent Labs    03/07/19 1416 03/07/19 1611 03/07/19 1828 03/07/19 2358 03/08/19 0217 03/08/19 0357 03/08/19 1221 03/09/19 0430  HGB 14.8  --   --  15.5  --   --   --  14.9  HCT 44.9  --   --  45.3  --   --   --  44.4  PLT 153  --   --  141*  --   --   --  159  APTT  --   --  40*  --   --  53* 65*  --   LABPROT  --   --  13.4  --   --   --   --   --   INR  --   --  1.0  --   --   --   --   --   HEPARINUNFRC  --   --  1.47*  --  0.84*  --   --  0.84*  CREATININE 0.80  --   --  0.81  --   --   --   --   TROPONINIHS 71* 82*  --  69* 57*  --   --   --     Estimated Creatinine Clearance: 115.4 mL/min (by C-G formula based on SCr of 0.81 mg/dL).  Assessment: 63 y.o. male admitted with chest pain, h/o Afib and Eliquis on hold, for heparin. Cath finding multivessel dx - now undergoing w/u for CABG.   Heparin level this morning came back elevated still at 0.84, aPTT is therapeutic at 86. Hgb 14.9, plt 159. No s/sx of bleeding or infusion issues.   Goal of Therapy:  APTT 66-102 sec Heparin level  0.3-0.7 units/ml Monitor platelets by anticoagulation protocol: Yes   Plan:  Continue heparin gtt at 1700 units/hr Monitor daily HL, CBC, and for s/sx of bleeding   Antonietta Jewel, PharmD, BCCCP Clinical Pharmacist  Phone: [phone]  Please check AMION for all [facility] phone numbers After 10:00 PM, call [place] [phone] 03/09/2019 5:44 AM

## 2019-03-09 NOTE — Progress Notes (Signed)
Progress Note  Patient Name: Martin [name] Date of Encounter: 03/09/2019  Primary Cardiologist: Kate Sable, MD   Subjective   Underwent cath yesterday, showed severe ostial LAD, severe ostial ramus, severe ostial LCX disease and CTO RCA.  CT surgery consulted, Dr. Prescott Gum planning CABG tomorrow.  Denies any chest pain or dyspnea this morning.  Inpatient Medications    Scheduled Meds: . allopurinol  300 mg Oral q morning - 10a  . aspirin EC  81 mg Oral Daily  . atorvastatin  80 mg Oral q1800  . bisacodyl  5 mg Oral Once  . chlorhexidine  60 mL Topical Once   And  . [START ON 03/10/2019] chlorhexidine  60 mL Topical Once  . [START ON 03/10/2019] chlorhexidine  15 mL Mouth/Throat Once  . Chlorhexidine Gluconate Cloth  6 each Topical Q0600  . fluticasone  1 spray Each Nare Daily  . folic acid  1 mg Oral Daily  . influenza vaccine adjuvanted  0.5 mL Intramuscular Tomorrow-1000  . lisinopril  40 mg Oral Daily  . magnesium oxide  400 mg Oral QHS  . [START ON 03/10/2019] metoprolol tartrate  12.5 mg Oral Once  . metoprolol tartrate  25 mg Oral BID  . mupirocin ointment  1 application Nasal BID  . omega-3 acid ethyl esters  2 g Oral BID  . sodium chloride flush  3 mL Intravenous Once  . sodium chloride flush  3 mL Intravenous Q12H  . sodium chloride flush  3 mL Intravenous Q12H  . triamterene-hydrochlorothiazide  1 tablet Oral q morning - 10a  . vitamin B-12  2,500 mcg Oral Daily   Continuous Infusions: . sodium chloride    . heparin 1,700 Units/hr (03/08/19 2145)  . nitroGLYCERIN Stopped (03/08/19 2305)   PRN Meds: sodium chloride, acetaminophen, ALPRAZolam, nitroGLYCERIN, ondansetron (ZOFRAN) IV, sodium chloride flush, temazepam   Vital Signs    Vitals:   03/08/19 2216 03/09/19 0048 03/09/19 0417 03/09/19 0808  BP:  (!) 164/83 137/77 131/76  Pulse: 62 (!) 54 (!) 50 (!) 55  Resp: 18 18 16 16   Temp:  (!) 97.5 F (36.4 C) 98.2 F (36.8 C) 98.3 F (36.8 C)   TempSrc:  Oral Oral Oral  SpO2: 99% 97% 97% 95%  Weight:   121.6 kg   Height:        Intake/Output Summary (Last 24 hours) at 03/09/2019 0848 Last data filed at 03/09/2019 0800 Gross per 24 hour  Intake 1101.9 ml  Output 1925 ml  Net -823.1 ml   Last 3 Weights 03/09/2019 03/08/2019 03/07/2019  Weight (lbs) 268 lb 268 lb 6.4 oz 270 lb  Weight (kg) 121.564 kg 121.745 kg 122.471 kg      Telemetry    NRS, rate 40-50s - Personally Reviewed  ECG    No new - Personally Reviewed  Physical Exam   GEN: No acute distress.   Neck: No JVD Cardiac: RRR, no murmurs, distant heart sounds Respiratory: Clear to auscultation bilaterally. GI: Soft, nontender, non-distended  MS: No edema; No deformity. Neuro:  Nonfocal  Psych: Normal affect   Labs    High Sensitivity Troponin:   Recent Labs  Lab 03/07/19 1416 03/07/19 1611 03/07/19 2358 03/08/19 0217  TROPONINIHS 71* 82* 69* 57*      Chemistry Recent Labs  Lab 03/07/19 1416 03/07/19 2358 03/09/19 0430  NA 139 139 139  K 3.8 3.8 4.2  CL 105 103 108  CO2 26 26 23   GLUCOSE 131* 112*  118*  BUN 21 15 16   CREATININE 0.80 0.81 0.87  CALCIUM 9.6 9.3 9.2  PROT  --   --  6.7  ALBUMIN  --   --  3.9  AST  --   --  25  ALT  --   --  28  ALKPHOS  --   --  56  BILITOT  --   --  1.1  GFRNONAA >60 >60 >60  GFRAA >60 >60 >60  ANIONGAP 8 10 8      Hematology Recent Labs  Lab 03/07/19 1416 03/07/19 2358 03/09/19 0430  WBC 7.3 8.2 7.3  RBC 4.46 4.62 4.47  HGB 14.8 15.5 14.9  HCT 44.9 45.3 44.4  MCV 100.7* 98.1 99.3  MCH 33.2 33.5 33.3  MCHC 33.0 34.2 33.6  RDW 13.0 13.0 13.2  PLT 153 141* 159    BNP Recent Labs  Lab 03/07/19 2358  BNP 320.2*     DDimer No results for input(s): DDIMER in the last 168 hours.   Radiology    Dg Chest 2 View  Result Date: 03/07/2019 CLINICAL DATA:  Chest pain for 2 weeks. EXAM: CHEST - 2 VIEW COMPARISON:  10/28/2017 and prior radiographs FINDINGS: Cardiomegaly and LEFT basilar  scarring again noted. There is no evidence of focal airspace disease, pulmonary edema, suspicious pulmonary nodule/mass, pleural effusion, or pneumothorax. No acute bony abnormalities are identified. IMPRESSION: Cardiomegaly without evidence of acute cardiopulmonary disease. Electronically Signed   By: Margarette Canada M.D.   On: 03/07/2019 14:30    Cardiac Studies  Cath 03/08/19:  Coronary angiography via right radial approach using real-time vascular ultrasound.  Complicated procedure due to significant innominate artery tortuosity making it difficult to advance catheters to the ascending aorta.  Total occlusion of the proximal to mid RCA within previous stented segment.  Right coronary collaterals from left to right.  Widely patent left main  Eccentric 70% ostial LAD.  Segmental 40 to 50% mid LAD.  Mid to distal LAD stent is widely patent.  90% ostial ramus.  Ramus is large.  90% ostial circumflex bifurcation with the ramus.  Acute on chronic combined systolic and diastolic heart failure.  EF 40%.  LVEDP 28 mmHg.  Plan start IV heparin, IV nitroglycerin to help lower LVEDP and blood pressure, and have evaluated by T CTS to consider multivessel coronary bypass grafting.         TTE 03/08/19:  1. Left ventricular ejection fraction, by visual estimation, is 55 to 60%. The left ventricle has normal function. Normal left ventricular size. There is mildly increased left ventricular hypertrophy. Basal inferior akinesis.  2. Left ventricular diastolic Doppler parameters are consistent with impaired relaxation pattern of LV diastolic filling.  3. Global right ventricle has normal systolic function.The right ventricular size is normal. No increase in right ventricular wall thickness.  4. The aortic valve is tricuspid Aortic valve regurgitation is trivial by color flow Doppler. Mild aortic valve stenosis. Mean gradient 10 mmHg.  5. There is mild dilatation of the ascending aorta measuring 42 mm.   6. Left atrial size was moderately dilated.  7. Right atrial size was mildly dilated.  8. The tricuspid valve is normal in structure. Tricuspid valve regurgitation was not visualized by color flow Doppler.  9. The mitral valve is normal in structure. No evidence of mitral valve regurgitation. No evidence of mitral stenosis. 10. TR signal is inadequate for assessing pulmonary artery systolic pressure. 11. The inferior vena cava is normal in size with greater  than 50% respiratory variability, suggesting right atrial pressure of 3 mmHg.   Patient Profile     64 y.o. male with CAD s/p LAD PCI 2001, LAD/RCA PCI 2011, paroxysmal atrial fibrillation who presents with chest pain  Assessment & Plan    CAD: s/p LAD PCI 2001, LAD/RCA PCI 2011. P/w mild troponin elevation.  Cath showed severe mutivessel disease: severe ostial LAD, severe ostial ramus, severe ostial LCX disease and CTO RCA.   - ASA 81 - Atorvastatin 80 daily - Heparin gtt - Started metoprolol 25 mg BID - CT surgery consulted, Dr. Prescott Gum planning CABG tomorrow.  HTN: Continue home triamterene/HCTZ and lisinopril.  Started metoprolol as above, holding home diltiazem   Atrial fibrillation: He is in NSR (versus ectopic atrial rhythm with abnormal P axis) today.   - Holding Eliquis (last dose 6:30AM 9/29) for CBG tomorrow.  On heparin gtt - Metoprolol 25 mg BID  Elevated TSH: 6.93.  Will check free t4  For questions or updates, please contact [place] Please consult [email or web address] for contact info under        Signed, Donato Heinz, MD  03/09/2019, 8:48 AM

## 2019-03-09 NOTE — Progress Notes (Signed)
Pre CABG Dopplers completed. Preliminary results in Chart review CV Proc. [place] Danh Bayus,RVS 03/09/2019, 4:02PM

## 2019-03-09 NOTE — Progress Notes (Addendum)
1 Day Post-Op Procedure(s) (LRB): LEFT HEART CATH AND CORONARY ANGIOGRAPHY (N/A) Subjective: Stable without chest pain on heparin, eliquis off for surgery in am Pre CABG dopplers done , report pending PFTs ok for sternotomy Plan multi vessel CABG +/- left atrial clip  In am   Objective: Vital signs in last 24 hours: Temp:  [97.5 F (36.4 C)-98.7 F (37.1 C)] 98.5 F (36.9 C) (10/01 1129) Pulse Rate:  [43-71] 43 (10/01 1129) Cardiac Rhythm: Sinus bradycardia;Bundle branch block;Other (Comment);Heart block (10/01 0700) Resp:  [0-81] 17 (10/01 1129) BP: (120-197)/(70-101) 151/97 (10/01 1129) SpO2:  [95 %-100 %] 96 % (10/01 1129) Weight:  [121.6 kg] 121.6 kg (10/01 0417)  Hemodynamic parameters for last 24 hours:  nsr  Intake/Output from previous day: 09/30 0701 - 10/01 0700 In: 901.9 [P.O.:462; I.V.:439.9] Out: 1925 [Urine:1925] Intake/Output this shift: Total I/O In: 200 [P.O.:200] Out: -        Exam    General- alert and comfortable    Neck- no JVD, no cervical adenopathy palpable, no carotid bruit   Lungs- clear without rales, wheezes   Cor- regular rate and rhythm, no murmur , gallop   Abdomen- soft, non-tender   Extremities - warm, non-tender, minimal edema   Neuro- oriented, appropriate, no focal weakness   Lab Results: Recent Labs    03/07/19 2358 03/09/19 0430  WBC 8.2 7.3  HGB 15.5 14.9  HCT 45.3 44.4  PLT 141* 159   BMET:  Recent Labs    03/07/19 2358 03/09/19 0430  NA 139 139  K 3.8 4.2  CL 103 108  CO2 26 23  GLUCOSE 112* 118*  BUN 15 16  CREATININE 0.81 0.87  CALCIUM 9.3 9.2    PT/INR:  Recent Labs    03/09/19 0430  LABPROT 14.7  INR 1.2   ABG    Component Value Date/Time   PHART 7.427 03/09/2019 0335   HCO3 24.5 03/09/2019 0335   O2SAT 95.9 03/09/2019 0335   CBG (last 3)  No results for input(s): GLUCAP in the last 72 hours.  Assessment/Plan: S/P Procedure(s) (LRB): LEFT HEART CATH AND CORONARY ANGIOGRAPHY (N/A) Plan  CABG in am Thiamine , ativan and spiritus frumenti ordered for hx daily ETOH use    LOS: 2 days    Martin [name] 03/09/2019

## 2019-03-09 NOTE — Progress Notes (Signed)
Patient places self on CPAP.  RT assistance not needed at this time. 

## 2019-03-10 ENCOUNTER — Inpatient Hospital Stay (HOSPITAL_COMMUNITY)
Admission: EM | Disposition: A | Discharge status: Rehabilitation Facility | Payer: Self-pay | Source: Home / Self Care | Attending: Cardiothoracic Surgery

## 2019-03-10 ENCOUNTER — Inpatient Hospital Stay (HOSPITAL_COMMUNITY): Payer: Medicare Other

## 2019-03-10 ENCOUNTER — Inpatient Hospital Stay (HOSPITAL_COMMUNITY): Payer: Medicare Other | Admitting: Anesthesiology

## 2019-03-10 DIAGNOSIS — Z951 Presence of aortocoronary bypass graft: Secondary | ICD-10-CM

## 2019-03-10 DIAGNOSIS — I25110 Atherosclerotic heart disease of native coronary artery with unstable angina pectoris: Secondary | ICD-10-CM

## 2019-03-10 HISTORY — PX: TEE WITHOUT CARDIOVERSION: SHX5443

## 2019-03-10 HISTORY — PX: CORONARY ARTERY BYPASS GRAFT: SHX141

## 2019-03-10 LAB — POCT I-STAT 7, (LYTES, BLD GAS, ICA,H+H)
Acid-base deficit: 1 mmol/L (ref 0.0–2.0)
Acid-base deficit: 1 mmol/L (ref 0.0–2.0)
Acid-base deficit: 1 mmol/L (ref 0.0–2.0)
Acid-base deficit: 4 mmol/L — ABNORMAL HIGH (ref 0.0–2.0)
Acid-base deficit: 3 mmol/L — ABNORMAL HIGH (ref 0.0–2.0)
Bicarbonate: 24.5 mmol/L (ref 20.0–28.0)
Bicarbonate: 23.5 mmol/L (ref 20.0–28.0)
Bicarbonate: 25.4 mmol/L (ref 20.0–28.0)
Bicarbonate: 21.6 mmol/L (ref 20.0–28.0)
Bicarbonate: 21.6 mmol/L (ref 20.0–28.0)
Calcium, Ion: 1.08 mmol/L — ABNORMAL LOW (ref 1.15–1.40)
Calcium, Ion: 1.07 mmol/L — ABNORMAL LOW (ref 1.15–1.40)
Calcium, Ion: 1.13 mmol/L — ABNORMAL LOW (ref 1.15–1.40)
Calcium, Ion: 1.11 mmol/L — ABNORMAL LOW (ref 1.15–1.40)
Calcium, Ion: 1.05 mmol/L — ABNORMAL LOW (ref 1.15–1.40)
HCT: 32 % — ABNORMAL LOW (ref 39.0–52.0)
HCT: 32 % — ABNORMAL LOW (ref 39.0–52.0)
HCT: 39 % (ref 39.0–52.0)
HCT: 37 % — ABNORMAL LOW (ref 39.0–52.0)
HCT: 35 % — ABNORMAL LOW (ref 39.0–52.0)
Hemoglobin: 10.9 g/dL — ABNORMAL LOW (ref 13.0–17.0)
Hemoglobin: 10.9 g/dL — ABNORMAL LOW (ref 13.0–17.0)
Hemoglobin: 13.3 g/dL (ref 13.0–17.0)
Hemoglobin: 12.6 g/dL — ABNORMAL LOW (ref 13.0–17.0)
Hemoglobin: 11.9 g/dL — ABNORMAL LOW (ref 13.0–17.0)
O2 Saturation: 100 %
O2 Saturation: 93 %
O2 Saturation: 93 %
O2 Saturation: 97 %
O2 Saturation: 97 %
Patient temperature: 36
Patient temperature: 36.6
Patient temperature: 37
Potassium: 3.7 mmol/L (ref 3.5–5.1)
Potassium: 4.1 mmol/L (ref 3.5–5.1)
Potassium: 4 mmol/L (ref 3.5–5.1)
Potassium: 3.9 mmol/L (ref 3.5–5.1)
Potassium: 4 mmol/L (ref 3.5–5.1)
Sodium: 137 mmol/L (ref 135–145)
Sodium: 137 mmol/L (ref 135–145)
Sodium: 139 mmol/L (ref 135–145)
Sodium: 140 mmol/L (ref 135–145)
Sodium: 140 mmol/L (ref 135–145)
TCO2: 26 mmol/L (ref 22–32)
TCO2: 25 mmol/L (ref 22–32)
TCO2: 27 mmol/L (ref 22–32)
TCO2: 23 mmol/L (ref 22–32)
TCO2: 23 mmol/L (ref 22–32)
pCO2 arterial: 43.8 mmHg (ref 32.0–48.0)
pCO2 arterial: 39.6 mmHg (ref 32.0–48.0)
pCO2 arterial: 44.9 mmHg (ref 32.0–48.0)
pCO2 arterial: 38.1 mmHg (ref 32.0–48.0)
pCO2 arterial: 35.9 mmHg (ref 32.0–48.0)
pH, Arterial: 7.356 (ref 7.350–7.450)
pH, Arterial: 7.381 (ref 7.350–7.450)
pH, Arterial: 7.356 (ref 7.350–7.450)
pH, Arterial: 7.359 (ref 7.350–7.450)
pH, Arterial: 7.388 (ref 7.350–7.450)
pO2, Arterial: 352 mmHg — ABNORMAL HIGH (ref 83.0–108.0)
pO2, Arterial: 67 mmHg — ABNORMAL LOW (ref 83.0–108.0)
pO2, Arterial: 69 mmHg — ABNORMAL LOW (ref 83.0–108.0)
pO2, Arterial: 93 mmHg (ref 83.0–108.0)
pO2, Arterial: 87 mmHg (ref 83.0–108.0)

## 2019-03-10 LAB — CBC
HCT: 44 % (ref 39.0–52.0)
HCT: 39.2 % (ref 39.0–52.0)
HCT: 38.6 % — ABNORMAL LOW (ref 39.0–52.0)
Hemoglobin: 15.1 g/dL (ref 13.0–17.0)
Hemoglobin: 13.6 g/dL (ref 13.0–17.0)
Hemoglobin: 13.4 g/dL (ref 13.0–17.0)
MCH: 33.9 pg (ref 26.0–34.0)
MCH: 34 pg (ref 26.0–34.0)
MCH: 34 pg (ref 26.0–34.0)
MCHC: 34.3 g/dL (ref 30.0–36.0)
MCHC: 34.7 g/dL (ref 30.0–36.0)
MCHC: 34.7 g/dL (ref 30.0–36.0)
MCV: 98.7 fL (ref 80.0–100.0)
MCV: 98 fL (ref 80.0–100.0)
MCV: 98 fL (ref 80.0–100.0)
Platelets: 133 10*3/uL — ABNORMAL LOW (ref 150–400)
Platelets: 179 10*3/uL (ref 150–400)
Platelets: 133 10*3/uL — ABNORMAL LOW (ref 150–400)
RBC: 4.46 MIL/uL (ref 4.22–5.81)
RBC: 4 MIL/uL — ABNORMAL LOW (ref 4.22–5.81)
RBC: 3.94 MIL/uL — ABNORMAL LOW (ref 4.22–5.81)
RDW: 13 % (ref 11.5–15.5)
RDW: 13 % (ref 11.5–15.5)
RDW: 12.7 % (ref 11.5–15.5)
WBC: 8.2 10*3/uL (ref 4.0–10.5)
WBC: 11.7 10*3/uL — ABNORMAL HIGH (ref 4.0–10.5)
WBC: 11 10*3/uL — ABNORMAL HIGH (ref 4.0–10.5)
nRBC: 0 % (ref 0.0–0.2)
nRBC: 0 % (ref 0.0–0.2)
nRBC: 0 % (ref 0.0–0.2)

## 2019-03-10 LAB — PROTIME-INR
INR: 1.4 — ABNORMAL HIGH (ref 0.8–1.2)
Prothrombin Time: 16.6 seconds — ABNORMAL HIGH (ref 11.4–15.2)

## 2019-03-10 LAB — GLUCOSE, CAPILLARY
Glucose-Capillary: 142 mg/dL — ABNORMAL HIGH (ref 70–99)
Glucose-Capillary: 144 mg/dL — ABNORMAL HIGH (ref 70–99)
Glucose-Capillary: 134 mg/dL — ABNORMAL HIGH (ref 70–99)
Glucose-Capillary: 134 mg/dL — ABNORMAL HIGH (ref 70–99)
Glucose-Capillary: 131 mg/dL — ABNORMAL HIGH (ref 70–99)
Glucose-Capillary: 133 mg/dL — ABNORMAL HIGH (ref 70–99)
Glucose-Capillary: 110 mg/dL — ABNORMAL HIGH (ref 70–99)
Glucose-Capillary: 151 mg/dL — ABNORMAL HIGH (ref 70–99)
Glucose-Capillary: 126 mg/dL — ABNORMAL HIGH (ref 70–99)

## 2019-03-10 LAB — POCT I-STAT 4, (NA,K, GLUC, HGB,HCT)
Glucose, Bld: 133 mg/dL — ABNORMAL HIGH (ref 70–99)
Glucose, Bld: 126 mg/dL — ABNORMAL HIGH (ref 70–99)
Glucose, Bld: 136 mg/dL — ABNORMAL HIGH (ref 70–99)
Glucose, Bld: 158 mg/dL — ABNORMAL HIGH (ref 70–99)
Glucose, Bld: 156 mg/dL — ABNORMAL HIGH (ref 70–99)
HCT: 41 % (ref 39.0–52.0)
HCT: 38 % — ABNORMAL LOW (ref 39.0–52.0)
HCT: 34 % — ABNORMAL LOW (ref 39.0–52.0)
HCT: 34 % — ABNORMAL LOW (ref 39.0–52.0)
HCT: 33 % — ABNORMAL LOW (ref 39.0–52.0)
Hemoglobin: 13.9 g/dL (ref 13.0–17.0)
Hemoglobin: 12.9 g/dL — ABNORMAL LOW (ref 13.0–17.0)
Hemoglobin: 11.6 g/dL — ABNORMAL LOW (ref 13.0–17.0)
Hemoglobin: 11.6 g/dL — ABNORMAL LOW (ref 13.0–17.0)
Hemoglobin: 11.2 g/dL — ABNORMAL LOW (ref 13.0–17.0)
Potassium: 3.6 mmol/L (ref 3.5–5.1)
Potassium: 5.3 mmol/L — ABNORMAL HIGH (ref 3.5–5.1)
Potassium: 4 mmol/L (ref 3.5–5.1)
Potassium: 4.3 mmol/L (ref 3.5–5.1)
Potassium: 4 mmol/L (ref 3.5–5.1)
Sodium: 137 mmol/L (ref 135–145)
Sodium: 136 mmol/L (ref 135–145)
Sodium: 136 mmol/L (ref 135–145)
Sodium: 138 mmol/L (ref 135–145)
Sodium: 137 mmol/L (ref 135–145)

## 2019-03-10 LAB — MAGNESIUM: Magnesium: 2.7 mg/dL — ABNORMAL HIGH (ref 1.7–2.4)

## 2019-03-10 LAB — BASIC METABOLIC PANEL
Anion gap: 12 (ref 5–15)
Anion gap: 8 (ref 5–15)
BUN: 16 mg/dL (ref 8–23)
BUN: 14 mg/dL (ref 8–23)
CO2: 23 mmol/L (ref 22–32)
CO2: 22 mmol/L (ref 22–32)
Calcium: 9 mg/dL (ref 8.9–10.3)
Calcium: 7.7 mg/dL — ABNORMAL LOW (ref 8.9–10.3)
Chloride: 103 mmol/L (ref 98–111)
Chloride: 107 mmol/L (ref 98–111)
Creatinine, Ser: 0.79 mg/dL (ref 0.61–1.24)
Creatinine, Ser: 0.75 mg/dL (ref 0.61–1.24)
GFR calc Af Amer: >60 mL/min (ref >60)
GFR calc Af Amer: >60 mL/min (ref >60)
GFR calc non Af Amer: >60 mL/min (ref >60)
GFR calc non Af Amer: >60 mL/min (ref >60)
Glucose, Bld: 119 mg/dL — ABNORMAL HIGH (ref 70–99)
Glucose, Bld: 132 mg/dL — ABNORMAL HIGH (ref 70–99)
Potassium: 3.6 mmol/L (ref 3.5–5.1)
Potassium: 3.8 mmol/L (ref 3.5–5.1)
Sodium: 138 mmol/L (ref 135–145)
Sodium: 137 mmol/L (ref 135–145)

## 2019-03-10 LAB — HEMOGLOBIN AND HEMATOCRIT, BLOOD
HCT: 34.1 % — ABNORMAL LOW (ref 39.0–52.0)
Hemoglobin: 11.8 g/dL — ABNORMAL LOW (ref 13.0–17.0)

## 2019-03-10 LAB — ECHO INTRAOPERATIVE TEE
Height: 68 in
Weight: 4105.6 oz

## 2019-03-10 LAB — PLATELET COUNT: Platelets: 111 10*3/uL — ABNORMAL LOW (ref 150–400)

## 2019-03-10 LAB — APTT: aPTT: 31 seconds (ref 24–36)

## 2019-03-10 LAB — HEPARIN LEVEL (UNFRACTIONATED): Heparin Unfractionated: 0.52 IU/mL (ref 0.30–0.70)

## 2019-03-10 SURGERY — CORONARY ARTERY BYPASS GRAFTING (CABG)
Anesthesia: General | Site: Chest

## 2019-03-10 MED ORDER — ROCURONIUM BROMIDE 10 MG/ML (PF) SYRINGE
PREFILLED_SYRINGE | INTRAVENOUS | Status: DC | PRN
  Administered 2019-03-10: 40 mg via INTRAVENOUS
  Administered 2019-03-10: 60 mg via INTRAVENOUS
  Administered 2019-03-10 (×2): 50 mg via INTRAVENOUS
  Administered 2019-03-10: 40 mg via INTRAVENOUS

## 2019-03-10 MED ORDER — LACTATED RINGERS IV SOLN: 500.0000 mL | Freq: Once | INTRAVENOUS | Status: DC | PRN

## 2019-03-10 MED ORDER — SODIUM CHLORIDE 0.9 % IV SOLN
INTRAVENOUS | Status: DC | PRN
  Administered 2019-03-10: 1.5 [IU]/h via INTRAVENOUS

## 2019-03-10 MED ORDER — MORPHINE SULFATE (PF) 2 MG/ML IV SOLN
1.0000 mg | INTRAVENOUS | Status: DC | PRN
  Administered 2019-03-10: 2 mg via INTRAVENOUS
  Administered 2019-03-10 – 2019-03-11 (×3): 4 mg via INTRAVENOUS
  Filled 2019-03-10 (×2): qty 2
  Filled 2019-03-10: qty 1
  Filled 2019-03-10: qty 2

## 2019-03-10 MED ORDER — ACETAMINOPHEN 650 MG RE SUPP
650.0000 mg | Freq: Once | RECTAL | Status: AC
  Administered 2019-03-10: 650 mg via RECTAL

## 2019-03-10 MED ORDER — SODIUM CHLORIDE 0.9% FLUSH
3.0000 mL | Freq: Two times a day (BID) | INTRAVENOUS | Status: DC
  Administered 2019-03-11 – 2019-03-26 (×21): 3 mL via INTRAVENOUS

## 2019-03-10 MED ORDER — EPHEDRINE SULFATE 50 MG/ML IJ SOLN
INTRAMUSCULAR | Status: DC | PRN
  Administered 2019-03-10: 5 mg via INTRAVENOUS

## 2019-03-10 MED ORDER — MIDAZOLAM HCL 2 MG/2ML IJ SOLN
2.0000 mg | INTRAMUSCULAR | Status: DC | PRN
  Administered 2019-03-10: 2 mg via INTRAVENOUS
  Filled 2019-03-10: qty 2

## 2019-03-10 MED ORDER — FENTANYL CITRATE (PF) 100 MCG/2ML IJ SOLN
INTRAMUSCULAR | Status: DC | PRN
  Administered 2019-03-10 (×3): 100 ug via INTRAVENOUS
  Administered 2019-03-10: 150 ug via INTRAVENOUS
  Administered 2019-03-10: 100 ug via INTRAVENOUS
  Administered 2019-03-10: 50 ug via INTRAVENOUS
  Administered 2019-03-10: 100 ug via INTRAVENOUS
  Administered 2019-03-10 (×3): 50 ug via INTRAVENOUS
  Administered 2019-03-10 (×4): 100 ug via INTRAVENOUS

## 2019-03-10 MED ORDER — PANTOPRAZOLE SODIUM 40 MG PO TBEC
40.0000 mg | DELAYED_RELEASE_TABLET | Freq: Every day | ORAL | Status: DC
  Administered 2019-03-12 – 2019-03-27 (×16): 40 mg via ORAL
  Filled 2019-03-10 (×16): qty 1

## 2019-03-10 MED ORDER — ARTIFICIAL TEARS OPHTHALMIC OINT
TOPICAL_OINTMENT | OPHTHALMIC | Status: DC | PRN
  Administered 2019-03-10: 1 via OPHTHALMIC

## 2019-03-10 MED ORDER — LEVOFLOXACIN IN D5W 750 MG/150ML IV SOLN
750.0000 mg | INTRAVENOUS | Status: AC
  Administered 2019-03-11: 11:00:00 750 mg via INTRAVENOUS
  Filled 2019-03-10: qty 150

## 2019-03-10 MED ORDER — OXYCODONE HCL 5 MG PO TABS
5.0000 mg | ORAL_TABLET | ORAL | Status: DC | PRN
  Administered 2019-03-10 – 2019-03-15 (×11): 10 mg via ORAL
  Administered 2019-03-16 – 2019-03-17 (×2): 5 mg via ORAL
  Filled 2019-03-10 (×2): qty 2
  Filled 2019-03-10: qty 1
  Filled 2019-03-10 (×10): qty 2
  Filled 2019-03-10: qty 1

## 2019-03-10 MED ORDER — ACETAMINOPHEN 160 MG/5ML PO SOLN: 650.0000 mg | Freq: Once | ORAL | Status: AC

## 2019-03-10 MED ORDER — SODIUM CHLORIDE 0.9 % IV SOLN
INTRAVENOUS | Status: DC | PRN
  Administered 2019-03-10: 30 ug/min via INTRAVENOUS

## 2019-03-10 MED ORDER — IPRATROPIUM-ALBUTEROL 0.5-2.5 (3) MG/3ML IN SOLN
3.0000 mL | Freq: Four times a day (QID) | RESPIRATORY_TRACT | Status: DC
  Administered 2019-03-10 (×2): 3 mL via RESPIRATORY_TRACT
  Filled 2019-03-10 (×2): qty 3

## 2019-03-10 MED ORDER — ROCURONIUM BROMIDE 10 MG/ML (PF) SYRINGE
PREFILLED_SYRINGE | INTRAVENOUS | Status: AC
  Filled 2019-03-10: qty 10

## 2019-03-10 MED ORDER — INSULIN REGULAR BOLUS VIA INFUSION
0.0000 [IU] | Freq: Three times a day (TID) | INTRAVENOUS | Status: DC
  Filled 2019-03-10: qty 10

## 2019-03-10 MED ORDER — POTASSIUM CHLORIDE 10 MEQ/50ML IV SOLN: 10.0000 meq | INTRAVENOUS | Status: AC

## 2019-03-10 MED ORDER — SODIUM CHLORIDE 0.9 % IV SOLN
INTRAVENOUS | Status: DC
  Administered 2019-03-10: 14:00:00 via INTRAVENOUS

## 2019-03-10 MED ORDER — DEXMEDETOMIDINE HCL IN NACL 400 MCG/100ML IV SOLN
0.0000 ug/kg/h | INTRAVENOUS | Status: DC
  Administered 2019-03-10: 14:00:00 0.7 ug/kg/h via INTRAVENOUS
  Filled 2019-03-10: qty 100

## 2019-03-10 MED ORDER — SODIUM CHLORIDE 0.9 % IV SOLN: INTRAVENOUS | Status: DC | PRN

## 2019-03-10 MED ORDER — ALBUMIN HUMAN 5 % IV SOLN
INTRAVENOUS | Status: DC | PRN
  Administered 2019-03-10: 12:00:00 via INTRAVENOUS

## 2019-03-10 MED ORDER — ACETAMINOPHEN 160 MG/5ML PO SOLN: 1000.0000 mg | Freq: Four times a day (QID) | ORAL | Status: AC

## 2019-03-10 MED ORDER — PHENYLEPHRINE HCL-NACL 20-0.9 MG/250ML-% IV SOLN: 0.0000 ug/min | INTRAVENOUS | Status: DC

## 2019-03-10 MED ORDER — ARTIFICIAL TEARS OPHTHALMIC OINT
TOPICAL_OINTMENT | OPHTHALMIC | Status: AC
  Filled 2019-03-10: qty 3.5

## 2019-03-10 MED ORDER — HEMOSTATIC AGENTS (NO CHARGE) OPTIME
TOPICAL | Status: DC | PRN
  Administered 2019-03-10: 1 via TOPICAL

## 2019-03-10 MED ORDER — 0.9 % SODIUM CHLORIDE (POUR BTL) OPTIME
TOPICAL | Status: DC | PRN
  Administered 2019-03-10: 08:00:00 5000 mL

## 2019-03-10 MED ORDER — MILRINONE LACTATE IN DEXTROSE 20-5 MG/100ML-% IV SOLN
0.1250 ug/kg/min | INTRAVENOUS | Status: DC
  Administered 2019-03-10 – 2019-03-13 (×6): 0.25 ug/kg/min via INTRAVENOUS
  Administered 2019-03-14 – 2019-03-15 (×2): 0.125 ug/kg/min via INTRAVENOUS
  Filled 2019-03-10 (×8): qty 100

## 2019-03-10 MED ORDER — PROTAMINE SULFATE 10 MG/ML IV SOLN
INTRAVENOUS | Status: DC | PRN
  Administered 2019-03-10 (×3): 100 mg via INTRAVENOUS
  Administered 2019-03-10: 40 mg via INTRAVENOUS

## 2019-03-10 MED ORDER — METOPROLOL TARTRATE 5 MG/5ML IV SOLN
2.5000 mg | INTRAVENOUS | Status: DC | PRN
  Administered 2019-03-14: 5 mg via INTRAVENOUS
  Filled 2019-03-10 (×3): qty 5

## 2019-03-10 MED ORDER — SODIUM CHLORIDE 0.9 % IV SOLN: 250.0000 mL | INTRAVENOUS | Status: DC

## 2019-03-10 MED ORDER — METOPROLOL TARTRATE 12.5 MG HALF TABLET
12.5000 mg | ORAL_TABLET | Freq: Two times a day (BID) | ORAL | Status: DC
  Administered 2019-03-10 – 2019-03-14 (×7): 12.5 mg via ORAL
  Filled 2019-03-10 (×8): qty 1

## 2019-03-10 MED ORDER — HEPARIN SODIUM (PORCINE) 1000 UNIT/ML IJ SOLN
INTRAMUSCULAR | Status: DC | PRN
  Administered 2019-03-10: 2000 [IU] via INTRAVENOUS
  Administered 2019-03-10: 35000 [IU] via INTRAVENOUS

## 2019-03-10 MED ORDER — SODIUM CHLORIDE 0.45 % IV SOLN
INTRAVENOUS | Status: DC | PRN
  Administered 2019-03-10: 14:00:00 via INTRAVENOUS

## 2019-03-10 MED ORDER — ACETAMINOPHEN 500 MG PO TABS
1000.0000 mg | ORAL_TABLET | Freq: Four times a day (QID) | ORAL | Status: AC
  Administered 2019-03-10 – 2019-03-15 (×18): 1000 mg via ORAL
  Filled 2019-03-10 (×18): qty 2

## 2019-03-10 MED ORDER — PROTAMINE SULFATE 10 MG/ML IV SOLN
INTRAVENOUS | Status: AC
  Filled 2019-03-10: qty 5

## 2019-03-10 MED ORDER — LACTATED RINGERS IV SOLN
INTRAVENOUS | Status: DC | PRN
  Administered 2019-03-10: 07:00:00 via INTRAVENOUS

## 2019-03-10 MED ORDER — ONDANSETRON HCL 4 MG/2ML IJ SOLN
4.0000 mg | Freq: Four times a day (QID) | INTRAMUSCULAR | Status: DC | PRN
  Administered 2019-03-11 – 2019-03-15 (×3): 4 mg via INTRAVENOUS
  Filled 2019-03-10 (×3): qty 2

## 2019-03-10 MED ORDER — METOPROLOL TARTRATE 25 MG/10 ML ORAL SUSPENSION: 12.5000 mg | Freq: Two times a day (BID) | ORAL | Status: DC

## 2019-03-10 MED ORDER — INSULIN REGULAR(HUMAN) IN NACL 100-0.9 UT/100ML-% IV SOLN: INTRAVENOUS | Status: DC

## 2019-03-10 MED ORDER — PROPOFOL 10 MG/ML IV BOLUS
INTRAVENOUS | Status: DC | PRN
  Administered 2019-03-10: 20 mg via INTRAVENOUS
  Administered 2019-03-10: 100 mg via INTRAVENOUS
  Administered 2019-03-10: 20 mg via INTRAVENOUS

## 2019-03-10 MED ORDER — CHLORHEXIDINE GLUCONATE 0.12 % MT SOLN
15.0000 mL | OROMUCOSAL | Status: AC
  Administered 2019-03-10: 14:00:00 15 mL via OROMUCOSAL

## 2019-03-10 MED ORDER — BISACODYL 5 MG PO TBEC
10.0000 mg | DELAYED_RELEASE_TABLET | Freq: Every day | ORAL | Status: DC
  Administered 2019-03-11 – 2019-03-15 (×5): 10 mg via ORAL
  Filled 2019-03-10 (×5): qty 2

## 2019-03-10 MED ORDER — MUPIROCIN 2 % EX OINT
1.0000 "application " | TOPICAL_OINTMENT | Freq: Two times a day (BID) | CUTANEOUS | Status: AC
  Administered 2019-03-10 – 2019-03-14 (×10): 1 via NASAL
  Filled 2019-03-10: qty 22

## 2019-03-10 MED ORDER — MIDAZOLAM HCL 5 MG/5ML IJ SOLN
INTRAMUSCULAR | Status: DC | PRN
  Administered 2019-03-10: 2 mg via INTRAVENOUS
  Administered 2019-03-10: 1 mg via INTRAVENOUS
  Administered 2019-03-10 (×3): 2 mg via INTRAVENOUS
  Administered 2019-03-10: 1 mg via INTRAVENOUS

## 2019-03-10 MED ORDER — PHENYLEPHRINE 40 MCG/ML (10ML) SYRINGE FOR IV PUSH (FOR BLOOD PRESSURE SUPPORT)
PREFILLED_SYRINGE | INTRAVENOUS | Status: DC | PRN
  Administered 2019-03-10: 10 ug via INTRAVENOUS

## 2019-03-10 MED ORDER — MAGNESIUM SULFATE 4 GM/100ML IV SOLN
4.0000 g | Freq: Once | INTRAVENOUS | Status: AC
  Administered 2019-03-10: 4 g via INTRAVENOUS
  Filled 2019-03-10: qty 100

## 2019-03-10 MED ORDER — HEPARIN SODIUM (PORCINE) 1000 UNIT/ML IJ SOLN
INTRAMUSCULAR | Status: AC
  Filled 2019-03-10: qty 1

## 2019-03-10 MED ORDER — PLASMA-LYTE 148 IV SOLN
INTRAVENOUS | Status: DC | PRN
  Administered 2019-03-10: 08:00:00 500 mL via INTRAVASCULAR

## 2019-03-10 MED ORDER — ASPIRIN EC 325 MG PO TBEC
325.0000 mg | DELAYED_RELEASE_TABLET | Freq: Every day | ORAL | Status: DC
  Administered 2019-03-11 – 2019-03-14 (×4): 325 mg via ORAL
  Filled 2019-03-10 (×4): qty 1

## 2019-03-10 MED ORDER — LACTATED RINGERS IV SOLN
INTRAVENOUS | Status: DC | PRN
  Administered 2019-03-10 (×2): via INTRAVENOUS

## 2019-03-10 MED ORDER — ALBUMIN HUMAN 5 % IV SOLN
250.0000 mL | INTRAVENOUS | Status: AC | PRN
  Administered 2019-03-10: 12.5 g via INTRAVENOUS

## 2019-03-10 MED ORDER — LACTATED RINGERS IV SOLN: INTRAVENOUS | Status: DC

## 2019-03-10 MED ORDER — PROPOFOL 10 MG/ML IV BOLUS
INTRAVENOUS | Status: AC
  Filled 2019-03-10: qty 20

## 2019-03-10 MED ORDER — DEXMEDETOMIDINE HCL IN NACL 200 MCG/50ML IV SOLN
INTRAVENOUS | Status: DC | PRN
  Administered 2019-03-10: .3 ug/kg/h via INTRAVENOUS

## 2019-03-10 MED ORDER — HEMOSTATIC AGENTS (NO CHARGE) OPTIME
TOPICAL | Status: DC | PRN
  Administered 2019-03-10: 3 via TOPICAL

## 2019-03-10 MED ORDER — MIDAZOLAM HCL (PF) 10 MG/2ML IJ SOLN
INTRAMUSCULAR | Status: AC
  Filled 2019-03-10: qty 2

## 2019-03-10 MED ORDER — ASPIRIN 81 MG PO CHEW: 324.0000 mg | CHEWABLE_TABLET | Freq: Every day | ORAL | Status: DC

## 2019-03-10 MED ORDER — VANCOMYCIN HCL IN DEXTROSE 1-5 GM/200ML-% IV SOLN
1000.0000 mg | Freq: Once | INTRAVENOUS | Status: AC
  Administered 2019-03-10: 1000 mg via INTRAVENOUS
  Filled 2019-03-10: qty 200

## 2019-03-10 MED ORDER — MILRINONE LACTATE IN DEXTROSE 20-5 MG/100ML-% IV SOLN
INTRAVENOUS | Status: DC | PRN
  Administered 2019-03-10: 0.25 ug/kg/min via INTRAVENOUS

## 2019-03-10 MED ORDER — NITROGLYCERIN 0.2 MG/ML ON CALL CATH LAB
INTRAVENOUS | Status: DC | PRN
  Administered 2019-03-10: 40 ug via INTRAVENOUS

## 2019-03-10 MED ORDER — TRAMADOL HCL 50 MG PO TABS
50.0000 mg | ORAL_TABLET | ORAL | Status: DC | PRN
  Administered 2019-03-10 – 2019-03-13 (×8): 100 mg via ORAL
  Administered 2019-03-18 – 2019-03-21 (×4): 50 mg via ORAL
  Administered 2019-03-27: 100 mg via ORAL
  Filled 2019-03-10: qty 2
  Filled 2019-03-10: qty 1
  Filled 2019-03-10 (×4): qty 2
  Filled 2019-03-10 (×2): qty 1
  Filled 2019-03-10 (×3): qty 2
  Filled 2019-03-10: qty 1
  Filled 2019-03-10: qty 2

## 2019-03-10 MED ORDER — DOCUSATE SODIUM 100 MG PO CAPS
200.0000 mg | ORAL_CAPSULE | Freq: Every day | ORAL | Status: DC
  Administered 2019-03-11 – 2019-03-27 (×14): 200 mg via ORAL
  Filled 2019-03-10 (×16): qty 2

## 2019-03-10 MED ORDER — SODIUM CHLORIDE 0.9% FLUSH: 3.0000 mL | INTRAVENOUS | Status: DC | PRN

## 2019-03-10 MED ORDER — NITROGLYCERIN IN D5W 200-5 MCG/ML-% IV SOLN: 0.0000 ug/min | INTRAVENOUS | Status: DC

## 2019-03-10 MED ORDER — CHLORHEXIDINE GLUCONATE CLOTH 2 % EX PADS
6.0000 | MEDICATED_PAD | Freq: Every day | CUTANEOUS | Status: AC
  Administered 2019-03-10 – 2019-03-13 (×4): 6 via TOPICAL

## 2019-03-10 MED ORDER — FENTANYL CITRATE (PF) 250 MCG/5ML IJ SOLN
INTRAMUSCULAR | Status: AC
  Filled 2019-03-10: qty 25

## 2019-03-10 MED ORDER — FAMOTIDINE IN NACL 20-0.9 MG/50ML-% IV SOLN
20.0000 mg | Freq: Two times a day (BID) | INTRAVENOUS | Status: AC
  Administered 2019-03-10: 20 mg via INTRAVENOUS

## 2019-03-10 MED ORDER — PROTAMINE SULFATE 10 MG/ML IV SOLN
INTRAVENOUS | Status: AC
  Filled 2019-03-10: qty 25

## 2019-03-10 MED ORDER — BISACODYL 10 MG RE SUPP: 10.0000 mg | Freq: Every day | RECTAL | Status: DC

## 2019-03-10 SURGICAL SUPPLY — 108 items
ADAPTER CARDIO PERF ANTE/RETRO (ADAPTER) ×4 IMPLANT
ADH SKN CLS APL DERMABOND .7 (GAUZE/BANDAGES/DRESSINGS) ×2
ADPR PRFSN 84XANTGRD RTRGD (ADAPTER) ×2
AGENT HMST KT MTR STRL THRMB (HEMOSTASIS) ×2
BAG DECANTER FOR FLEXI CONT (MISCELLANEOUS) ×4 IMPLANT
BASKET HEART  (ORDER IN 25'S) (MISCELLANEOUS) ×1
BASKET HEART (ORDER IN 25'S) (MISCELLANEOUS) ×1
BASKET HEART (ORDER IN 25S) (MISCELLANEOUS) ×2 IMPLANT
BLADE CLIPPER SURG (BLADE) ×4 IMPLANT
BLADE STERNUM SYSTEM 6 (BLADE) ×4 IMPLANT
BLADE SURG 12 STRL SS (BLADE) ×4 IMPLANT
BNDG ELASTIC 4X5.8 VLCR STR LF (GAUZE/BANDAGES/DRESSINGS) ×4 IMPLANT
BNDG ELASTIC 6X5.8 VLCR STR LF (GAUZE/BANDAGES/DRESSINGS) ×4 IMPLANT
BNDG GAUZE ELAST 4 BULKY (GAUZE/BANDAGES/DRESSINGS) ×4 IMPLANT
CANISTER SUCT 3000ML PPV (MISCELLANEOUS) ×4 IMPLANT
CANNULA ARTERIAL NVNT 3/8 22FR (MISCELLANEOUS) ×2 IMPLANT
CANNULA GUNDRY RCSP 15FR (MISCELLANEOUS) ×4 IMPLANT
CATH CPB KIT VANTRIGT (MISCELLANEOUS) ×4 IMPLANT
CATH ROBINSON RED A/P 18FR (CATHETERS) ×12 IMPLANT
CATH THORACIC 36FR RT ANG (CATHETERS) ×4 IMPLANT
COVER WAND RF STERILE (DRAPES) ×4 IMPLANT
DERMABOND ADVANCED (GAUZE/BANDAGES/DRESSINGS) ×2
DERMABOND ADVANCED .7 DNX12 (GAUZE/BANDAGES/DRESSINGS) IMPLANT
DRAIN CHANNEL 32F RND 10.7 FF (WOUND CARE) ×6 IMPLANT
DRAPE CARDIOVASCULAR INCISE (DRAPES) ×4
DRAPE SLUSH/WARMER DISC (DRAPES) ×4 IMPLANT
DRAPE SRG 135X102X78XABS (DRAPES) ×2 IMPLANT
DRSG AQUACEL AG ADV 3.5X14 (GAUZE/BANDAGES/DRESSINGS) ×4 IMPLANT
ELECT BLADE 4.0 EZ CLEAN MEGAD (MISCELLANEOUS) ×4
ELECT BLADE 6.5 EXT (BLADE) ×4 IMPLANT
ELECT CAUTERY BLADE 6.4 (BLADE) ×4 IMPLANT
ELECT REM PT RETURN 9FT ADLT (ELECTROSURGICAL) ×8
ELECTRODE BLDE 4.0 EZ CLN MEGD (MISCELLANEOUS) ×2 IMPLANT
ELECTRODE REM PT RTRN 9FT ADLT (ELECTROSURGICAL) ×4 IMPLANT
FELT TEFLON 1X6 (MISCELLANEOUS) ×8 IMPLANT
GAUZE SPONGE 4X4 12PLY STRL (GAUZE/BANDAGES/DRESSINGS) ×8 IMPLANT
GLOVE BIO SURGEON STRL SZ7.5 (GLOVE) ×16 IMPLANT
GLOVE BIOGEL PI IND STRL 6 (GLOVE) IMPLANT
GLOVE BIOGEL PI IND STRL 6.5 (GLOVE) IMPLANT
GLOVE BIOGEL PI IND STRL 7.0 (GLOVE) IMPLANT
GLOVE BIOGEL PI IND STRL 7.5 (GLOVE) IMPLANT
GLOVE BIOGEL PI INDICATOR 6 (GLOVE) ×2
GLOVE BIOGEL PI INDICATOR 6.5 (GLOVE) ×6
GLOVE BIOGEL PI INDICATOR 7.0 (GLOVE) ×4
GLOVE BIOGEL PI INDICATOR 7.5 (GLOVE) ×6
GLOVE SURG SS PI 7.0 STRL IVOR (GLOVE) ×6 IMPLANT
GLOVE SURG SS PI 7.5 STRL IVOR (GLOVE) ×8 IMPLANT
GOWN STRL REUS W/ TWL LRG LVL3 (GOWN DISPOSABLE) ×8 IMPLANT
GOWN STRL REUS W/TWL LRG LVL3 (GOWN DISPOSABLE) ×40
HEMOSTAT POWDER SURGIFOAM 1G (HEMOSTASIS) ×12 IMPLANT
HEMOSTAT SURGICEL 2X14 (HEMOSTASIS) ×4 IMPLANT
INSERT FOGARTY XLG (MISCELLANEOUS) IMPLANT
KIT BASIN OR (CUSTOM PROCEDURE TRAY) ×4 IMPLANT
KIT SUCTION CATH 14FR (SUCTIONS) ×4 IMPLANT
KIT TURNOVER KIT B (KITS) ×4 IMPLANT
KIT VASOVIEW HEMOPRO 2 VH 4000 (KITS) ×4 IMPLANT
LEAD PACING MYOCARDI (MISCELLANEOUS) ×6 IMPLANT
MARKER GRAFT CORONARY BYPASS (MISCELLANEOUS) ×10 IMPLANT
NS IRRIG 1000ML POUR BTL (IV SOLUTION) ×20 IMPLANT
PACK E OPEN HEART (SUTURE) ×4 IMPLANT
PACK OPEN HEART (CUSTOM PROCEDURE TRAY) ×4 IMPLANT
PAD ARMBOARD 7.5X6 YLW CONV (MISCELLANEOUS) ×8 IMPLANT
PAD ELECT DEFIB RADIOL ZOLL (MISCELLANEOUS) ×4 IMPLANT
PENCIL BUTTON HOLSTER BLD 10FT (ELECTRODE) ×4 IMPLANT
POSITIONER HEAD DONUT 9IN (MISCELLANEOUS) ×4 IMPLANT
PUNCH AORTIC ROTATE  4.5MM 8IN (MISCELLANEOUS) ×2 IMPLANT
PUNCH AORTIC ROTATE 4.0MM (MISCELLANEOUS) IMPLANT
PUNCH AORTIC ROTATE 4.5MM 8IN (MISCELLANEOUS) IMPLANT
PUNCH AORTIC ROTATE 5MM 8IN (MISCELLANEOUS) IMPLANT
SET CARDIOPLEGIA MPS 5001102 (MISCELLANEOUS) ×2 IMPLANT
SPONGE LAP 18X18 RF (DISPOSABLE) ×2 IMPLANT
SPONGE LAP 4X18 RFD (DISPOSABLE) ×4 IMPLANT
SURGIFLO W/THROMBIN 8M KIT (HEMOSTASIS) ×4 IMPLANT
SUT BONE WAX W31G (SUTURE) ×4 IMPLANT
SUT MNCRL AB 4-0 PS2 18 (SUTURE) IMPLANT
SUT PROLENE 3 0 SH DA (SUTURE) IMPLANT
SUT PROLENE 3 0 SH1 36 (SUTURE) IMPLANT
SUT PROLENE 4 0 RB 1 (SUTURE) ×4
SUT PROLENE 4 0 SH DA (SUTURE) ×4 IMPLANT
SUT PROLENE 4-0 RB1 .5 CRCL 36 (SUTURE) ×2 IMPLANT
SUT PROLENE 5 0 C 1 36 (SUTURE) IMPLANT
SUT PROLENE 6 0 C 1 30 (SUTURE) ×4 IMPLANT
SUT PROLENE 6 0 CC (SUTURE) ×14 IMPLANT
SUT PROLENE 8 0 BV175 6 (SUTURE) ×2 IMPLANT
SUT PROLENE BLUE 7 0 (SUTURE) ×6 IMPLANT
SUT SILK  1 MH (SUTURE)
SUT SILK 1 MH (SUTURE) IMPLANT
SUT SILK 2 0 SH CR/8 (SUTURE) ×2 IMPLANT
SUT SILK 3 0 SH CR/8 (SUTURE) IMPLANT
SUT STEEL 6MS V (SUTURE) ×6 IMPLANT
SUT STEEL SZ 6 DBL 3X14 BALL (SUTURE) ×6 IMPLANT
SUT VIC AB 1 CTX 36 (SUTURE) ×8
SUT VIC AB 1 CTX36XBRD ANBCTR (SUTURE) ×4 IMPLANT
SUT VIC AB 2-0 CT1 27 (SUTURE) ×4
SUT VIC AB 2-0 CT1 TAPERPNT 27 (SUTURE) IMPLANT
SUT VIC AB 2-0 CTX 27 (SUTURE) IMPLANT
SUT VIC AB 3-0 X1 27 (SUTURE) ×2 IMPLANT
SYSTEM SAHARA CHEST DRAIN ATS (WOUND CARE) ×4 IMPLANT
TAPE CLOTH SURG 4X10 WHT LF (GAUZE/BANDAGES/DRESSINGS) ×2 IMPLANT
TAPE PAPER 2X10 WHT MICROPORE (GAUZE/BANDAGES/DRESSINGS) ×2 IMPLANT
TOWEL GREEN STERILE (TOWEL DISPOSABLE) ×4 IMPLANT
TOWEL GREEN STERILE FF (TOWEL DISPOSABLE) ×4 IMPLANT
TRAY FOLEY SLVR 16FR TEMP STAT (SET/KITS/TRAYS/PACK) ×4 IMPLANT
TUBE CONNECTING 20'X1/4 (TUBING) ×1
TUBE CONNECTING 20X1/4 (TUBING) ×1 IMPLANT
TUBING LAP HI FLOW INSUFFLATIO (TUBING) ×4 IMPLANT
UNDERPAD 30X30 (UNDERPADS AND DIAPERS) ×4 IMPLANT
WATER STERILE IRR 1000ML POUR (IV SOLUTION) ×8 IMPLANT

## 2019-03-10 NOTE — Addendum Note (Signed)
Addendum  created 03/10/19 1855 by Roberts Gaudy, MD   Order list changed

## 2019-03-10 NOTE — Anesthesia Preprocedure Evaluation (Signed)
Anesthesia Evaluation  Patient identified by MRN, date of birth, ID band Patient awake    Reviewed: Allergy & Precautions, NPO status , Patient's Chart, lab work & pertinent test results  Airway Mallampati: III  TM Distance: >3 FB Neck ROM: Full    Dental  (+) Teeth Intact, Dental Advisory Given   Pulmonary former smoker,    breath sounds clear to auscultation       Cardiovascular hypertension,  Rhythm:Regular Rate:Normal     Neuro/Psych    GI/Hepatic   Endo/Other    Renal/GU      Musculoskeletal   Abdominal   Peds  Hematology   Anesthesia Other Findings   Reproductive/Obstetrics                             Anesthesia Physical Anesthesia Plan  ASA: III  Anesthesia Plan: General   Post-op Pain Management:    Induction: Intravenous  PONV Risk Score and Plan: Ondansetron  Airway Management Planned: Oral ETT  Additional Equipment: Arterial line, 3D TEE, Ultrasound Guidance Line Placement and PA Cath  Intra-op Plan:   Post-operative Plan:   Informed Consent: I have reviewed the patients History and Physical, chart, labs and discussed the procedure including the risks, benefits and alternatives for the proposed anesthesia with the patient or authorized representative who has indicated his/her understanding and acceptance.     Dental advisory given  Plan Discussed with: CRNA and Anesthesiologist  Anesthesia Plan Comments:         Anesthesia Quick Evaluation

## 2019-03-10 NOTE — Procedures (Signed)
Extubation Procedure Note  Patient Details:   Name: Martin [name] DOB: 30-Mar-1956 MRN: [id number]   Airway Documentation:    Vent end date: 03/10/19 Vent end time: 1718   Evaluation  O2 sats: stable throughout Complications: No apparent complications Patient did tolerate procedure well. Bilateral Breath Sounds: Clear, Diminished   Yes   Pt weaned per heart wean protocol and extubated to 4L [place]. Cuff leak heard prior to extubation. Pt able to voice name and place after procedure. RN and RT at bedside. Vitals stable. No apparent complications noted at this time.   Esperanza Sheets T 03/10/2019, 5:19 PM

## 2019-03-10 NOTE — Anesthesia Procedure Notes (Signed)
Central Venous Catheter Insertion Performed by: Roberts Gaudy, MD, anesthesiologist Start/End10/07/2018 6:50 AM, 03/10/2019 7:00 AM Patient location: Pre-op. Preanesthetic checklist: patient identified, IV checked, site marked, risks and benefits discussed, surgical consent, monitors and equipment checked, pre-op evaluation, timeout performed and anesthesia consent Hand hygiene performed  and maximum sterile barriers used  PA cath was placed.Swan type:thermodilution Procedure performed without using ultrasound guided technique. Ultrasound Notes:anatomy identified, needle tip was noted to be adjacent to the nerve/plexus identified and no ultrasound evidence of intravascular and/or intraneural injection Attempts: 1 Following insertion, line sutured, dressing applied and Biopatch. Patient tolerated the procedure well with no immediate complications.

## 2019-03-10 NOTE — Progress Notes (Signed)
PT HR  Sustaining at 40's -50'. DR Marletta Lor was notified and got an order to hold lopressor-pre-op med scheduled this AM.

## 2019-03-10 NOTE — Anesthesia Procedure Notes (Signed)
Procedure Name: Intubation Date/Time: 03/10/2019 7:58 AM Performed by: Glynda Jaeger, CRNA Pre-anesthesia Checklist: Patient identified, Emergency Drugs available, Suction available and Patient being monitored Patient Re-evaluated:Patient Re-evaluated prior to induction Oxygen Delivery Method: Circle System Utilized Preoxygenation: Pre-oxygenation with 100% oxygen Induction Type: IV induction Ventilation: Oral airway inserted - appropriate to patient size and Two handed mask ventilation required Laryngoscope Size: Mac and 4 Grade View: Grade II Tube type: Oral Tube size: 8.0 mm Number of attempts: 1 Airway Equipment and Method: Stylet and Oral airway Placement Confirmation: ETT inserted through vocal cords under direct vision,  positive ETCO2 and breath sounds checked- equal and bilateral Secured at: 23 cm Tube secured with: Tape Dental Injury: Teeth and Oropharynx as per pre-operative assessment  Comments: Performed by Reece Agar, SRNA

## 2019-03-10 NOTE — Brief Op Note (Signed)
03/07/2019 - 03/10/2019  2:02 PM  PATIENT:  Floyde Parkins  63 y.o. male  PRE-OPERATIVE DIAGNOSIS:  Coronary Artery Disease  POST-OPERATIVE DIAGNOSIS:  Coronary Artery Disease  PROCEDURE:  Procedure(s): CORONARY ARTERY BYPASS GRAFTING (CABG)X 3 Using Left Internal Mammary Artery and Right Saphenous Vein Grafts (N/A) TRANSESOPHAGEAL ECHOCARDIOGRAM (TEE) (N/A) LIMA-LAD SVG-RAMUS SVG-PDA  SURGEON:  Surgeon(s) and Role:    Ivin Poot, MD - Primary  PHYSICIAN ASSISTANT: Garrell Flagg PA-C  ANESTHESIA:   general  EBL:  785 mL   BLOOD ADMINISTERED:none  DRAINS: LEFT PLEURAL AND MEDIASTINAL CHEST TUBES   LOCAL MEDICATIONS USED:  NONE  SPECIMEN:  No Specimen  DISPOSITION OF SPECIMEN:  N/A  COUNTS:  YES  TOURNIQUET:  * No tourniquets in log *  DICTATION: .Other Dictation: Dictation Number [id number]  PLAN OF CARE: Admit to inpatient   PATIENT DISPOSITION:  ICU - intubated and hemodynamically stable.   Delay start of Pharmacological VTE agent (>24hrs) due to surgical blood loss or risk of bleeding: yes

## 2019-03-10 NOTE — Anesthesia Procedure Notes (Signed)
Central Venous Catheter Insertion Performed by: Roberts Gaudy, MD, anesthesiologist Start/End10/07/2018 6:50 AM, 03/10/2019 7:00 AM Patient location: Pre-op. Preanesthetic checklist: patient identified, IV checked, site marked, risks and benefits discussed, surgical consent, monitors and equipment checked, pre-op evaluation, timeout performed and anesthesia consent Lidocaine 1% used for infiltration and patient sedated Hand hygiene performed  and maximum sterile barriers used  Catheter size: 8.5 Fr Sheath introducer Procedure performed using ultrasound guided technique. Ultrasound Notes:anatomy identified, needle tip was noted to be adjacent to the nerve/plexus identified, no ultrasound evidence of intravascular and/or intraneural injection and image(s) printed for medical record Attempts: 1 Following insertion, line sutured and dressing applied. Post procedure assessment: blood return through all ports, free fluid flow and no air  Patient tolerated the procedure well with no immediate complications.

## 2019-03-10 NOTE — Transfer of Care (Signed)
Immediate Anesthesia Transfer of Care Note  Patient: HART BART  Procedure(s) Performed: CORONARY ARTERY BYPASS GRAFTING (CABG)X 3 Using Left Internal Mammary Artery and Right Saphenous Vein Grafts (N/A Chest) TRANSESOPHAGEAL ECHOCARDIOGRAM (TEE) (N/A )  Patient Location: ICU  Anesthesia Type:General  Level of Consciousness: Patient remains intubated per anesthesia plan  Airway & Oxygen Therapy: Patient remains intubated per anesthesia plan and Patient placed on Ventilator (see vital sign flow sheet for setting)  Post-op Assessment: Report given to RN and Post -op Vital signs reviewed and stable  Post vital signs: Reviewed and stable  Last Vitals:  Vitals Value Taken Time  BP    Temp    Pulse    Resp 19 03/10/19 1338  SpO2    Vitals shown include unvalidated device data.  Last Pain:  Vitals:   03/10/19 0338  TempSrc: Oral  PainSc:          Complications: No apparent anesthesia complications

## 2019-03-10 NOTE — Anesthesia Procedure Notes (Signed)
Arterial Line Insertion Start/End10/07/2018 6:40 AM, 03/10/2019 6:55 AM Performed by: CRNA  Patient location: Pre-op. Preanesthetic checklist: patient identified, IV checked, site marked, risks and benefits discussed, surgical consent, monitors and equipment checked, pre-op evaluation, timeout performed and anesthesia consent Lidocaine 1% used for infiltration Left, radial was placed Catheter size: 20 Fr Hand hygiene performed  and maximum sterile barriers used   Attempts: 1 Procedure performed without using ultrasound guided technique. Following insertion, dressing applied and Biopatch. Post procedure assessment: normal and unchanged  Patient tolerated the procedure well with no immediate complications. Additional procedure comments: Performed by Reece Agar, SRNA under supervision of CRNA and MDA.

## 2019-03-10 NOTE — Progress Notes (Signed)
Pre Procedure note for inpatients:   Martin [name] has been scheduled for Procedure(s): CORONARY ARTERY BYPASS GRAFTING (CABG) (N/A) TRANSESOPHAGEAL ECHOCARDIOGRAM (TEE) (N/A) today. The various methods of treatment have been discussed with the patient. After consideration of the risks, benefits and treatment options the patient has consented to the planned procedure.   The patient has been seen and labs reviewed. There are no changes in the patient's condition to prevent proceeding with the planned procedure today.  Recent labs:  Lab Results  Component Value Date   WBC 8.2 03/10/2019   HGB 15.1 03/10/2019   HCT 44.0 03/10/2019   PLT 133 (L) 03/10/2019   GLUCOSE 119 (H) 03/10/2019   CHOL 170 03/07/2019   TRIG 218 (H) 03/07/2019   HDL 41 03/07/2019   LDLCALC 85 03/07/2019   ALT 28 03/09/2019   AST 25 03/09/2019   NA 138 03/10/2019   K 3.6 03/10/2019   CL 103 03/10/2019   CREATININE 0.79 03/10/2019   BUN 16 03/10/2019   CO2 23 03/10/2019   TSH 6.931 (H) 03/09/2019   INR 1.2 03/09/2019   HGBA1C 5.6 03/09/2019    Ivin Poot III, MD 03/10/2019 7:26 AM

## 2019-03-10 NOTE — Anesthesia Postprocedure Evaluation (Signed)
Anesthesia Post Note  Patient: Martin [name]  Procedure(s) Performed: CORONARY ARTERY BYPASS GRAFTING (CABG)X 3 Using Left Internal Mammary Artery and Right Saphenous Vein Grafts (N/A Chest) TRANSESOPHAGEAL ECHOCARDIOGRAM (TEE) (N/A )     Patient location during evaluation: SICU Anesthesia Type: General Level of consciousness: sedated and patient remains intubated per anesthesia plan Pain management: pain level controlled Vital Signs Assessment: post-procedure vital signs reviewed and stable Respiratory status: patient remains intubated per anesthesia plan and patient on ventilator - see flowsheet for VS Cardiovascular status: stable Postop Assessment: no apparent nausea or vomiting Anesthetic complications: no    Last Vitals:  Vitals:   03/10/19 1830 03/10/19 1845  BP:    Pulse: 85 80  Resp: (!) 26 19  Temp: 37 C 37 C  SpO2: 98% 97%    Last Pain:  Vitals:   03/10/19 1830  TempSrc:   PainSc: Asleep                 Ling Flesch COKER

## 2019-03-10 NOTE — Progress Notes (Signed)
Patient ID: Martin [name], male   DOB: 1955/08/18, 63 y.o.   MRN: [id number] EVENING ROUNDS NOTE :     [address]       [place] [address]             [phone]                 Day of Surgery Procedure(s) (LRB): CORONARY ARTERY BYPASS GRAFTING (CABG)X 3 Using Left Internal Mammary Artery and Right Saphenous Vein Grafts (N/A) TRANSESOPHAGEAL ECHOCARDIOGRAM (TEE) (N/A)  Total Length of Stay:  LOS: 3 days  BP (!) 150/73   Pulse 86   Temp 98.2 F (36.8 C)   Resp (!) 23   Ht 5\' 8"  (1.727 m)   Wt 116.4 kg Comment: scaleB  SpO2 98%   BMI 39.02 kg/m   .Intake/Output      10/01 0701 - 10/02 0700 10/02 0701 - 10/03 0700   P.O. 200    I.V. (mL/kg) 134.7 (1.2) 2590.9 (22.3)   Blood  640   IV Piggyback  604.4   Total Intake(mL/kg) 334.7 (2.9) 3835.3 (32.9)   Urine (mL/kg/hr)  2260 (1.7)   Emesis/NG output  0   Blood  785   Chest Tube  170   Total Output  3215   Net +334.7 +620.3        Urine Occurrence 3 x      . sodium chloride 10 mL/hr at 03/10/19 1700  . [START ON 03/11/2019] sodium chloride    . sodium chloride 10 mL/hr at 03/10/19 1428  . albumin human Stopped (03/10/19 1500)  . dexmedetomidine (PRECEDEX) IV infusion Stopped (03/10/19 1630)  . famotidine (PEPCID) IV Stopped (03/10/19 1444)  . insulin 2.2 mL/hr at 03/10/19 1700  . lactated ringers    . lactated ringers    . lactated ringers Stopped (03/10/19 1608)  . [START ON 03/11/2019] levofloxacin (LEVAQUIN) IV    . magnesium sulfate 20 mL/hr at 03/10/19 1700  . milrinone 0.25 mcg/kg/min (03/10/19 1700)  . nitroGLYCERIN Stopped (03/10/19 1607)  . phenylephrine (NEO-SYNEPHRINE) Adult infusion Stopped (03/10/19 1553)  . vancomycin       Lab Results  Component Value Date   WBC 11.7 (H) 03/10/2019   HGB 12.6 (L) 03/10/2019   HCT 37.0 (L) 03/10/2019   PLT 179 03/10/2019   GLUCOSE 156 (H) 03/10/2019   CHOL 170 03/07/2019   TRIG 218 (H) 03/07/2019   HDL 41 03/07/2019   LDLCALC 85  03/07/2019   ALT 28 03/09/2019   AST 25 03/09/2019   NA 140 03/10/2019   K 3.9 03/10/2019   CL 103 03/10/2019   CREATININE 0.79 03/10/2019   BUN 16 03/10/2019   CO2 23 03/10/2019   TSH 6.931 (H) 03/09/2019   INR 1.4 (H) 03/10/2019   HGBA1C 5.6 03/09/2019   Stable early postop Extubated  Not bleeding    Grace Isaac MD  Beeper [phone] Office [phone] 03/10/2019 6:27 PM

## 2019-03-11 ENCOUNTER — Inpatient Hospital Stay (HOSPITAL_COMMUNITY): Payer: Medicare Other

## 2019-03-11 DIAGNOSIS — I493 Ventricular premature depolarization: Secondary | ICD-10-CM

## 2019-03-11 DIAGNOSIS — I472 Ventricular tachycardia: Secondary | ICD-10-CM

## 2019-03-11 LAB — GLUCOSE, CAPILLARY
Glucose-Capillary: 111 mg/dL — ABNORMAL HIGH (ref 70–99)
Glucose-Capillary: 119 mg/dL — ABNORMAL HIGH (ref 70–99)
Glucose-Capillary: 123 mg/dL — ABNORMAL HIGH (ref 70–99)
Glucose-Capillary: 123 mg/dL — ABNORMAL HIGH (ref 70–99)
Glucose-Capillary: 125 mg/dL — ABNORMAL HIGH (ref 70–99)
Glucose-Capillary: 132 mg/dL — ABNORMAL HIGH (ref 70–99)
Glucose-Capillary: 132 mg/dL — ABNORMAL HIGH (ref 70–99)
Glucose-Capillary: 138 mg/dL — ABNORMAL HIGH (ref 70–99)
Glucose-Capillary: 138 mg/dL — ABNORMAL HIGH (ref 70–99)
Glucose-Capillary: 141 mg/dL — ABNORMAL HIGH (ref 70–99)
Glucose-Capillary: 146 mg/dL — ABNORMAL HIGH (ref 70–99)
Glucose-Capillary: 149 mg/dL — ABNORMAL HIGH (ref 70–99)
Glucose-Capillary: 155 mg/dL — ABNORMAL HIGH (ref 70–99)
Glucose-Capillary: 160 mg/dL — ABNORMAL HIGH (ref 70–99)

## 2019-03-11 LAB — URINALYSIS, ROUTINE W REFLEX MICROSCOPIC
Bacteria, UA: NONE SEEN
Bilirubin Urine: NEGATIVE
Glucose, UA: NEGATIVE mg/dL
Ketones, ur: NEGATIVE mg/dL
Nitrite: NEGATIVE
Protein, ur: 30 mg/dL — AB
Specific Gravity, Urine: 1.023 (ref 1.005–1.030)
pH: 5 (ref 5.0–8.0)

## 2019-03-11 LAB — BASIC METABOLIC PANEL
Anion gap: 8 (ref 5–15)
Anion gap: 10 (ref 5–15)
BUN: 14 mg/dL (ref 8–23)
BUN: 20 mg/dL (ref 8–23)
CO2: 21 mmol/L — ABNORMAL LOW (ref 22–32)
CO2: 23 mmol/L (ref 22–32)
Calcium: 7.7 mg/dL — ABNORMAL LOW (ref 8.9–10.3)
Calcium: 8.3 mg/dL — ABNORMAL LOW (ref 8.9–10.3)
Chloride: 105 mmol/L (ref 98–111)
Chloride: 101 mmol/L (ref 98–111)
Creatinine, Ser: 0.88 mg/dL (ref 0.61–1.24)
Creatinine, Ser: 1.42 mg/dL — ABNORMAL HIGH (ref 0.61–1.24)
GFR calc Af Amer: >60 mL/min (ref >60)
GFR calc Af Amer: 60 mL/min — ABNORMAL LOW (ref >60)
GFR calc non Af Amer: >60 mL/min (ref >60)
GFR calc non Af Amer: 51 mL/min — ABNORMAL LOW (ref >60)
Glucose, Bld: 134 mg/dL — ABNORMAL HIGH (ref 70–99)
Glucose, Bld: 174 mg/dL — ABNORMAL HIGH (ref 70–99)
Potassium: 3.9 mmol/L (ref 3.5–5.1)
Potassium: 4.2 mmol/L (ref 3.5–5.1)
Sodium: 134 mmol/L — ABNORMAL LOW (ref 135–145)
Sodium: 134 mmol/L — ABNORMAL LOW (ref 135–145)

## 2019-03-11 LAB — MAGNESIUM
Magnesium: 2.2 mg/dL (ref 1.7–2.4)
Magnesium: 2.1 mg/dL (ref 1.7–2.4)

## 2019-03-11 LAB — CBC
HCT: 40.1 % (ref 39.0–52.0)
HCT: 40.2 % (ref 39.0–52.0)
Hemoglobin: 13.6 g/dL (ref 13.0–17.0)
Hemoglobin: 13.1 g/dL (ref 13.0–17.0)
MCH: 33.4 pg (ref 26.0–34.0)
MCH: 33.3 pg (ref 26.0–34.0)
MCHC: 33.9 g/dL (ref 30.0–36.0)
MCHC: 32.6 g/dL (ref 30.0–36.0)
MCV: 98.5 fL (ref 80.0–100.0)
MCV: 102.3 fL — ABNORMAL HIGH (ref 80.0–100.0)
Platelets: 137 10*3/uL — ABNORMAL LOW (ref 150–400)
Platelets: 158 10*3/uL (ref 150–400)
RBC: 4.07 MIL/uL — ABNORMAL LOW (ref 4.22–5.81)
RBC: 3.93 MIL/uL — ABNORMAL LOW (ref 4.22–5.81)
RDW: 13 % (ref 11.5–15.5)
RDW: 13.3 % (ref 11.5–15.5)
WBC: 14.8 10*3/uL — ABNORMAL HIGH (ref 4.0–10.5)
WBC: 17.7 10*3/uL — ABNORMAL HIGH (ref 4.0–10.5)
nRBC: 0 % (ref 0.0–0.2)
nRBC: 0 % (ref 0.0–0.2)

## 2019-03-11 LAB — POCT I-STAT 7, (LYTES, BLD GAS, ICA,H+H)
Bicarbonate: 23.9 mmol/L (ref 20.0–28.0)
Calcium, Ion: 1.14 mmol/L — ABNORMAL LOW (ref 1.15–1.40)
HCT: 39 % (ref 39.0–52.0)
Hemoglobin: 13.3 g/dL (ref 13.0–17.0)
O2 Saturation: 92 %
Patient temperature: 37.4
Potassium: 4 mmol/L (ref 3.5–5.1)
Sodium: 138 mmol/L (ref 135–145)
TCO2: 25 mmol/L (ref 22–32)
pCO2 arterial: 37.8 mmHg (ref 32.0–48.0)
pH, Arterial: 7.41 (ref 7.350–7.450)
pO2, Arterial: 65 mmHg — ABNORMAL LOW (ref 83.0–108.0)

## 2019-03-11 MED ORDER — INSULIN DETEMIR 100 UNIT/ML ~~LOC~~ SOLN
10.0000 [IU] | Freq: Once | SUBCUTANEOUS | Status: AC
  Administered 2019-03-11: 10 [IU] via SUBCUTANEOUS
  Filled 2019-03-11: qty 0.1

## 2019-03-11 MED ORDER — INSULIN DETEMIR 100 UNIT/ML ~~LOC~~ SOLN
10.0000 [IU] | Freq: Every day | SUBCUTANEOUS | Status: DC
  Administered 2019-03-12 – 2019-03-25 (×14): 10 [IU] via SUBCUTANEOUS
  Filled 2019-03-11 (×15): qty 0.1

## 2019-03-11 MED ORDER — INSULIN ASPART 100 UNIT/ML ~~LOC~~ SOLN
0.0000 [IU] | SUBCUTANEOUS | Status: DC
  Administered 2019-03-11: 21:00:00 4 [IU] via SUBCUTANEOUS
  Administered 2019-03-11: 17:00:00 2 [IU] via SUBCUTANEOUS
  Administered 2019-03-12: 08:00:00 4 [IU] via SUBCUTANEOUS
  Administered 2019-03-12 (×2): 2 [IU] via SUBCUTANEOUS
  Administered 2019-03-12 (×2): 4 [IU] via SUBCUTANEOUS

## 2019-03-11 MED ORDER — ENOXAPARIN SODIUM 40 MG/0.4ML ~~LOC~~ SOLN
40.0000 mg | Freq: Every day | SUBCUTANEOUS | Status: DC
  Administered 2019-03-11 – 2019-03-12 (×2): 40 mg via SUBCUTANEOUS
  Filled 2019-03-11 (×2): qty 0.4

## 2019-03-11 MED ORDER — IPRATROPIUM-ALBUTEROL 0.5-2.5 (3) MG/3ML IN SOLN: 3.0000 mL | Freq: Four times a day (QID) | RESPIRATORY_TRACT | Status: DC | PRN

## 2019-03-11 MED ORDER — FUROSEMIDE 10 MG/ML IJ SOLN
40.0000 mg | Freq: Once | INTRAMUSCULAR | Status: DC
  Filled 2019-03-11: qty 4

## 2019-03-11 NOTE — Progress Notes (Signed)
Patient ID: Martin [name], male   DOB: August 24, 1955, 63 y.o.   MRN: [id number] EVENING ROUNDS NOTE :     [address]       [place] [address]             [phone]                 1 Day Post-Op Procedure(s) (LRB): CORONARY ARTERY BYPASS GRAFTING (CABG)X 3 Using Left Internal Mammary Artery and Right Saphenous Vein Grafts (N/A) TRANSESOPHAGEAL ECHOCARDIOGRAM (TEE) (N/A)  Total Length of Stay:  LOS: 4 days  BP 135/63   Pulse 95   Temp 99 F (37.2 C)   Resp (!) 25   Ht 5\' 8"  (1.727 m)   Wt 124.1 kg   SpO2 92%   BMI 41.60 kg/m   .Intake/Output      10/02 0701 - 10/03 0700 10/03 0701 - 10/04 0700   P.O.  240   I.V. (mL/kg) 3252.9 (26.2) 88.5 (0.7)   Blood 640    IV Piggyback 640.1    Total Intake(mL/kg) 4533 (36.5) 328.5 (2.6)   Urine (mL/kg/hr) 3015 (1)    Emesis/NG output 0    Blood 785    Chest Tube 570    Total Output 4370    Net +163 +328.5          . sodium chloride 10 mL/hr at 03/11/19 0900  . sodium chloride    . sodium chloride 10 mL/hr at 03/10/19 1428  . dexmedetomidine (PRECEDEX) IV infusion Stopped (03/10/19 1630)  . insulin Stopped (03/11/19 1523)  . lactated ringers    . lactated ringers    . lactated ringers 20 mL/hr at 03/11/19 0900  . milrinone 0.25 mcg/kg/min (03/11/19 0900)  . nitroGLYCERIN Stopped (03/10/19 1607)  . phenylephrine (NEO-SYNEPHRINE) Adult infusion 4 mcg/min (03/11/19 0900)     Lab Results  Component Value Date   WBC 17.7 (H) 03/11/2019   HGB 13.1 03/11/2019   HCT 40.2 03/11/2019   PLT 158 03/11/2019   GLUCOSE 134 (H) 03/11/2019   CHOL 170 03/07/2019   TRIG 218 (H) 03/07/2019   HDL 41 03/07/2019   LDLCALC 85 03/07/2019   ALT 28 03/09/2019   AST 25 03/09/2019   NA 138 03/11/2019   K 4.0 03/11/2019   CL 105 03/11/2019   CREATININE 0.88 03/11/2019   BUN 14 03/11/2019   CO2 21 (L) 03/11/2019   TSH 6.931 (H) 03/09/2019   INR 1.4 (H) 03/10/2019   HGBA1C 5.6 03/09/2019   Some decrease in uop 10  ml/hour  275 /12 hours On home Elberta MD  Beeper [phone] Office [phone] 03/11/2019 6:12 PM

## 2019-03-11 NOTE — Progress Notes (Signed)
Ambulated patient to bathroom at 0600 . Patient was instructed to pull call bell for assistance when finished. Alarm from ECG monitor sounded and this RN immediately responded. Upon arrival into the patient room this RN assessed the patient up from the commode, attempting to walk back to bed without assistance. This RN picked up the patient's equipment in attempt to get the patient back to bed safely. At this time the patient became dizzy and weak. The patient became hypotensive and HR up to 150s. 12 lead EKG was obtained and patient rhythm was Afib RVR. Paged Turner MD. Given orders for Amiodarone bolus + infusion. Patient converted back to NSR as this RN was starting Amio bolus. Bolus paused due to conversion back to NSR.

## 2019-03-11 NOTE — Progress Notes (Addendum)
Progress Note  Patient Name: Martin [name] Date of Encounter: 03/11/2019  Primary Cardiologist: Kate Sable, MD   Subjective   Incisional pain.  Not sleeping well due to not liking the CPAP in the hospital.  His wife is bringing his CPAP today.  Inpatient Medications    Scheduled Meds:  acetaminophen  1,000 mg Oral Q6H   Or   acetaminophen (TYLENOL) oral liquid 160 mg/5 mL  1,000 mg Per Tube Q6H   allopurinol  300 mg Oral q morning - 10a   aspirin EC  325 mg Oral Daily   Or   aspirin  324 mg Per Tube Daily   atorvastatin  80 mg Oral q1800   bisacodyl  10 mg Oral Daily   Or   bisacodyl  10 mg Rectal Daily   Chlorhexidine Gluconate Cloth  6 each Topical Q0600   docusate sodium  200 mg Oral Daily   enoxaparin (LOVENOX) injection  40 mg Subcutaneous QHS   fluticasone  1 spray Each Nare Daily   folic acid  1 mg Oral Daily   insulin aspart  0-24 Units Subcutaneous Q4H   insulin detemir  10 Units Subcutaneous Once   [START ON 03/12/2019] insulin detemir  10 Units Subcutaneous Daily   insulin regular  0-10 Units Intravenous TID WC   metoprolol tartrate  12.5 mg Oral BID   Or   metoprolol tartrate  12.5 mg Per Tube BID   mupirocin ointment  1 application Nasal BID   [START ON 03/12/2019] pantoprazole  40 mg Oral Daily   sodium chloride flush  3 mL Intravenous Q12H   thiamine  100 mg Intravenous Daily   Continuous Infusions:  sodium chloride 10 mL/hr at 03/11/19 0900   sodium chloride     sodium chloride 10 mL/hr at 03/10/19 1428   albumin human Stopped (03/10/19 1500)   dexmedetomidine (PRECEDEX) IV infusion Stopped (03/10/19 1630)   insulin 3.1 mL/hr at 03/11/19 0900   lactated ringers     lactated ringers     lactated ringers 20 mL/hr at 03/11/19 0900   levofloxacin (LEVAQUIN) IV     milrinone 0.25 mcg/kg/min (03/11/19 0900)   nitroGLYCERIN Stopped (03/10/19 1607)   phenylephrine (NEO-SYNEPHRINE) Adult infusion 4 mcg/min  (03/11/19 0900)   PRN Meds: sodium chloride, albumin human, ipratropium-albuterol, lactated ringers, metoprolol tartrate, midazolam, morphine injection, ondansetron (ZOFRAN) IV, oxyCODONE, sodium chloride flush, traMADol   Vital Signs    Vitals:   03/11/19 0715 03/11/19 0730 03/11/19 0800 03/11/19 0900  BP:  119/73 106/75 110/78  Pulse: 97 88 79 76  Resp: (!) 31 (!) 29 (!) 25 (!) 23  Temp: 99.3 F (37.4 C) 99.1 F (37.3 C) 98.8 F (37.1 C) 99 F (37.2 C)  TempSrc:   Core   SpO2: 90% 92% 93% 94%  Weight:      Height:        Intake/Output Summary (Last 24 hours) at 03/11/2019 1021 Last data filed at 03/11/2019 0900 Gross per 24 hour  Intake 3811.47 ml  Output 4195 ml  Net -383.53 ml   Last 3 Weights 03/11/2019 03/10/2019 03/09/2019  Weight (lbs) 273 lb 9.5 oz 256 lb 9.6 oz 268 lb  Weight (kg) 124.1 kg 116.393 kg 121.564 kg      Telemetry    Sinus rhythm.  27 beats of ventricular tachycardia at 150 bpm.- Personally Reviewed  ECG    03/11/2019: Sinus rhythm.  Rate 86 bpm.  Left axis deviation.  Nonspecific T wave  abnormalities.- Personally Reviewed  Physical Exam   VS:  BP 110/78    Pulse 74    Temp 98.8 F (37.1 C)    Resp 19    Ht 5\' 8"  (1.727 m)    Wt 124.1 kg    SpO2 92%    BMI 41.60 kg/m  , BMI Body mass index is 41.6 kg/m. GENERAL:  Well appearing.  Mild distress HEENT: Pupils equal round and reactive, fundi not visualized, oral mucosa unremarkable NECK:  No jugular venous distention, waveform within normal limits, carotid upstroke brisk and symmetric, no bruits LUNGS:  Clear to auscultation bilaterally HEART:  RRR.  PMI not displaced or sustained,S1 and S2 within normal limits, no S3, no S4, no clicks, no rubs, no murmurs ABD:  Flat, positive bowel sounds normal in frequency in pitch, no bruits, no rebound, no guarding, no midline pulsatile mass, no hepatomegaly, no splenomegaly EXT:  2 plus pulses throughout, trace edema, no cyanosis no clubbing.  R LE  wrapped. SKIN:  No rashes no nodules NEURO:  Cranial nerves II through XII grossly intact, motor grossly intact throughout [facility]:  Cognitively intact, oriented to person place and time  Labs    High Sensitivity Troponin:   Recent Labs  Lab 03/07/19 1416 03/07/19 1611 03/07/19 2358 03/08/19 0217  TROPONINIHS 71* 82* 69* 57*      Chemistry Recent Labs  Lab 03/09/19 0430 03/10/19 0325  03/10/19 1219  03/10/19 1857 03/11/19 0409 03/11/19 0646  NA 139 138   < > 137   < > 137 134* 138  K 4.2 3.6   < > 4.0   < > 3.8 3.9 4.0  CL 108 103  --   --   --  107 105  --   CO2 23 23  --   --   --  22 21*  --   GLUCOSE 118* 119*   < > 156*  --  132* 134*  --   BUN 16 16  --   --   --  14 14  --   CREATININE 0.87 0.79  --   --   --  0.75 0.88  --   CALCIUM 9.2 9.0  --   --   --  7.7* 7.7*  --   PROT 6.7  --   --   --   --   --   --   --   ALBUMIN 3.9  --   --   --   --   --   --   --   AST 25  --   --   --   --   --   --   --   ALT 28  --   --   --   --   --   --   --   ALKPHOS 56  --   --   --   --   --   --   --   BILITOT 1.1  --   --   --   --   --   --   --   GFRNONAA >60 >60  --   --   --  >60 >60  --   GFRAA >60 >60  --   --   --  >60 >60  --   ANIONGAP 8 12  --   --   --  8 8  --    < > = values in this interval not  displayed.     Hematology Recent Labs  Lab 03/10/19 1355  03/10/19 1857 03/11/19 0409 03/11/19 0646  WBC 11.7*  --  11.0* 14.8*  --   RBC 4.00*  --  3.94* 4.07*  --   HGB 13.3   13.6   < > 13.4 13.6 13.3  HCT 39.0   39.2   < > 38.6* 40.1 39.0  MCV 98.0  --  98.0 98.5  --   MCH 34.0  --  34.0 33.4  --   MCHC 34.7  --  34.7 33.9  --   RDW 13.0  --  12.7 13.0  --   PLT 179  --  133* 137*  --    < > = values in this interval not displayed.    BNP Recent Labs  Lab 03/07/19 2358  BNP 320.2*     DDimer No results for input(s): DDIMER in the last 168 hours.   Radiology    Dg Chest 1 View  Result Date: 03/10/2019 CLINICAL DATA:  Status post CABG,  rule out pneumo. EXAM: CHEST  1 VIEW COMPARISON:  03/07/2019 FINDINGS: Now status post median sternotomy and CABG. Swan-Ganz catheter in proximal right pulmonary artery. A transesophageal echocardiographic pro passes to the level of the T10 vertebral body. Seven intact sternal wires are noted. Left-sided chest tube in place without signs of pneumothorax on supine radiograph. Cardiomediastinal contours with mild widening superiorly likely secondary to postoperative change otherwise unchanged accounting for low lung volumes. There is a single tube in the midline projecting over sternal wires extending to the level of the sternal manubrium. This may represent mediastinal drain. IMPRESSION: 1. Status post CABG with left chest tube without visible pneumothorax. 2. Swan-Ganz catheter and transesophageal echocardiographic probe in place. 3. Midline drainage tubing projects over the mediastinum, potentially mediastinal drain, clinical correlation suggested. Electronically Signed   By: Zetta Bills M.D.   On: 03/10/2019 13:30   Dg Chest Port 1 View  Result Date: 03/11/2019 CLINICAL DATA:  CABG x3 03/10/2019. EXAM: PORTABLE CHEST 1 VIEW COMPARISON:  03/10/2019 FINDINGS: Right IJ Swan-Ganz catheter unchanged with tip over the right main pulmonary artery. Left-sided chest tube unchanged. Mediastinal drain unchanged. Lungs are adequately inflated with very minimal bibasilar density likely small amount of bilateral pleural fluid/atelectasis. Mild stable cardiomegaly. Remainder of the exam is unchanged. IMPRESSION: Suggestion of small amount of bilateral pleural fluid/atelectasis. Stable cardiomegaly. Tubes and lines as described. Electronically Signed   By: Marin Olp M.D.   On: 03/11/2019 08:46   Dg Chest Port 1 View  Result Date: 03/10/2019 CLINICAL DATA:  S/p CABG x 3, chest tube in place, pt intubated. EXAM: PORTABLE CHEST 1 VIEW COMPARISON:  Chest radiograph 03/10/2019 at 1:09 p.m. FINDINGS: Stable enlarged  cardiomediastinal contours status post median sternotomy. Interval intubation with endotracheal tube tip projecting between the thoracic inlet and carina. Stable remaining support apparatus including a PA catheter, enteric tube and left chest tube. The lungs are well aerated. No new focal pulmonary opacity. No pneumothorax or large pleural effusion. IMPRESSION: 1. Interval intubation with endotracheal tube tip projecting between the thoracic inlet and carina. 2. Postoperative appearance of the chest. Electronically Signed   By: Audie Pinto M.D.   On: 03/10/2019 15:08   Vas US Doppler Pre Cabg  Result Date: 03/09/2019 PREOPERATIVE VASCULAR EVALUATION  Indications:      Pre CABG evaluation. Risk Factors:     Hypertension, hyperlipidemia, coronary artery disease. Other Factors:    Atrial fibrillation. Comparison  Study: Prior study done 05/23/16 is available for comparison Performing Technologist: Birdena Crandall, [place] Technologist: Sharion Dove RVS  Examination Guidelines: A complete evaluation includes B-mode imaging, spectral Doppler, color Doppler, and power Doppler as needed of all accessible portions of each vessel. Bilateral testing is considered an integral part of a complete examination. Limited examinations for reoccurring indications may be performed as noted.  Right Carotid Findings: +----------+--------+--------+--------+--------+------------------+             PSV cm/s EDV cm/s Stenosis Describe Comments            +----------+--------+--------+--------+--------+------------------+  CCA Prox   46       6                 calcific                     +----------+--------+--------+--------+--------+------------------+  CCA Distal 42       7                          intimal thickening  +----------+--------+--------+--------+--------+------------------+  ICA Prox   46       14                calcific Shadowing           +----------+--------+--------+--------+--------+------------------+   ICA Distal 52       11                                             +----------+--------+--------+--------+--------+------------------+  ECA        108      11                                             +----------+--------+--------+--------+--------+------------------+ Portions of this table do not appear on this page. +----------+--------+-------+--------+------------+             PSV cm/s EDV cms Describe Arm Pressure  +----------+--------+-------+--------+------------+  Subclavian 161                       145           +----------+--------+-------+--------+------------+ +---------+--------+--+--------+--+  Vertebral PSV cm/s 34 EDV cm/s 11  +---------+--------+--+--------+--+ Left Carotid Findings: +----------+--------+--------+--------+------------+------------------+             PSV cm/s EDV cm/s Stenosis Describe     Comments            +----------+--------+--------+--------+------------+------------------+  CCA Prox   55       13                homogeneous                      +----------+--------+--------+--------+------------+------------------+  CCA Distal 57       12                             intimal thickening  +----------+--------+--------+--------+------------+------------------+  ICA Prox   66       22                heterogenous                     +----------+--------+--------+--------+------------+------------------+  ICA Distal 77       21                             tortuous            +----------+--------+--------+--------+------------+------------------+  ECA        184      19                                                 +----------+--------+--------+--------+------------+------------------+ +----------+--------+--------+--------+------------+  Subclavian PSV cm/s EDV cm/s Describe Arm Pressure  +----------+--------+--------+--------+------------+             220                        145           +----------+--------+--------+--------+------------+ +---------+--------+--+--------+-+   Vertebral PSV cm/s 33 EDV cm/s 7  +---------+--------+--+--------+-+  ABI Findings: +---------+------------------+-----+----------+--------+  Right     Rt Pressure (mmHg) Index Waveform   Comment   +---------+------------------+-----+----------+--------+  Brachial  145                      triphasic            +---------+------------------+-----+----------+--------+  PTA       104                0.72  monophasic           +---------+------------------+-----+----------+--------+  DP        117                0.81  monophasic           +---------+------------------+-----+----------+--------+  Great Toe 76                 0.52                       +---------+------------------+-----+----------+--------+ +---------+------------------+-----+---------+-------+  Left      Lt Pressure (mmHg) Index Waveform  Comment  +---------+------------------+-----+---------+-------+  Brachial  145                      triphasic          +---------+------------------+-----+---------+-------+  PTA       186                1.28  biphasic           +---------+------------------+-----+---------+-------+  DP        175                1.21  biphasic           +---------+------------------+-----+---------+-------+  Great Toe 118                0.81                     +---------+------------------+-----+---------+-------+ +-------+---------------+----------------+  ABI/TBI Today's ABI/TBI Previous ABI/TBI  +-------+---------------+----------------+  Right   0.81/0.52       No previous       +-------+---------------+----------------+  Left    1.28/0.81       No previous       +-------+---------------+----------------+  Right Doppler Findings: +-----------+--------+-----+---------+--------+  Site  Pressure Index Doppler   Comments  +-----------+--------+-----+---------+--------+  Brachial    145            triphasic           +-----------+--------+-----+---------+--------+  Radial                     triphasic            +-----------+--------+-----+---------+--------+  Ulnar                      triphasic           +-----------+--------+-----+---------+--------+  Palmar Arch                          normal    +-----------+--------+-----+---------+--------+  Left Doppler Findings: +-----------+--------+-----+---------+--------+  Site        Pressure Index Doppler   Comments  +-----------+--------+-----+---------+--------+  Brachial    145            triphasic           +-----------+--------+-----+---------+--------+  Radial                     triphasic           +-----------+--------+-----+---------+--------+  Ulnar                      triphasic           +-----------+--------+-----+---------+--------+  Palmar Arch                          normal    +-----------+--------+-----+---------+--------+  Summary: Right Carotid: Velocities in the right ICA are consistent with a 1-39% stenosis.                No change since study done 05/23/16. Left Carotid: Velocities in the left ICA are consistent with a 1-39% stenosis.               No change since study done 05/23/16. Vertebrals:  Bilateral vertebral arteries demonstrate antegrade flow. Subclavians: Normal flow hemodynamics were seen in bilateral subclavian              arteries. Right ABI: Resting right ankle-brachial index indicates mild right lower extremity arterial disease. The right toe-brachial index is abnormal. Left ABI: Resting left ankle-brachial index is within normal range. No evidence of significant left lower extremity arterial disease. The left toe-brachial index is normal. Right Upper Extremity: Doppler waveforms remain within normal limits with right radial compression. Doppler waveforms remain within normal limits with right ulnar compression. Left Upper Extremity: Doppler waveforms remain within normal limits with left radial compression. Doppler waveforms remain within normal limits with left ulnar compression.  Electronically signed by Servando Snare MD on 03/09/2019  at 5:45:33 PM.    Final     Cardiac Studies   Echo 03/08/19: IMPRESSIONS   1. Left ventricular ejection fraction, by visual estimation, is 55 to 60%. The left ventricle has normal function. Normal left ventricular size. There is mildly increased left ventricular hypertrophy. Basal inferior akinesis.  2. Left ventricular diastolic Doppler parameters are consistent with impaired relaxation pattern of LV diastolic filling.  3. Global right ventricle has normal systolic function.The right ventricular size is normal. No increase in right ventricular wall thickness.  4. The aortic valve is tricuspid Aortic valve regurgitation is trivial by color flow Doppler. Mild  aortic valve stenosis. Mean gradient 10 mmHg.  5. There is mild dilatation of the ascending aorta measuring 42 mm.  6. Left atrial size was moderately dilated.  7. Right atrial size was mildly dilated.  8. The tricuspid valve is normal in structure. Tricuspid valve regurgitation was not visualized by color flow Doppler.  9. The mitral valve is normal in structure. No evidence of mitral valve regurgitation. No evidence of mitral stenosis. 10. TR signal is inadequate for assessing pulmonary artery systolic pressure. 11. The inferior vena cava is normal in size with greater than 50% respiratory variability, suggesting right atrial pressure of 3 mmHg.  Carotid Dopplers 03/09/2019: 1 to 39% ICA stenosis bilaterally.  Left heart cath 03/08/2019:  Severe three-vessel coronary calcification  Patent left main  Eccentric ostial 50 to 75% LAD with mid vessel 40 to 50% narrowing.  Mid to distal LAD stent is widely patent.  Ramus intermedius contains proximal 90% stenosis.  Circumflex contains 99% proximal/ostial stenosis.  RCA is heavily calcified and stented.  The artery is totally occluded in the proximal to mid segment.  Left-to-right collaterals are noted.  Mildly reduced LV systolic function with EF estimated to be 40%.  LVEDP is 28  mmHg.  Findings compatible with acute on chronic combined   Patient Profile     63 y.o. male with CAD status post LAD PCI, paroxysmal atrial fibrillation, hypertension, and OSA on CPAP admitted with NSTEMI.  Assessment & Plan    # NSTEMI: # s/p CABG:  Left heart cath revealed severe ostial LAD, severe ostial ramus, and severe ostial left circumflex disease.  RCA was completely occluded.  He underwent CABG with Dr. Darcey Nora on 10/2.  Continue aspirin and atorvastatin.   # VT:  Mr. Cohee had a 27 beat (11 sec) run of VT at a rate of around 150 bpm. Continue metoprolol and titrate as BP allows.   # Hypertension: Home meds on hold.  Weaning midodrine per CT surgery.  # PAF:  He has remained in sinus rhythm.  Home Eliquis on hold.  Will resume per CT surgery.  # DM:  On insulin.  # Leukocytosis:  WBC continues to rise.  No clear source.  Will send u/a.  He is getting levofloxacin.    Time spent: 35 minutes-Greater than 50% of this time was spent in counseling, explanation of diagnosis, planning of further management, and coordination of care.   For questions or updates, please contact [place] Please consult [email or web address] for contact info under        Signed, Skeet Latch, MD  03/11/2019, 10:21 AM

## 2019-03-11 NOTE — Progress Notes (Signed)
Patient ID: SHINJI MEEKER, male   DOB: [birth year], 63 y.o.   MRN: [id number] TCTS DAILY ICU PROGRESS NOTE                   [address]            [place] [address]          [id number]   1 Day Post-Op Procedure(s) (LRB): CORONARY ARTERY BYPASS GRAFTING (CABG)X 3 Using Left Internal Mammary Artery and Right Saphenous Vein Grafts (N/A) TRANSESOPHAGEAL ECHOCARDIOGRAM (TEE) (N/A)  Total Length of Stay:  LOS: 4 days   Subjective: Awake and alert, neuro intact  Objective: Vital signs in last 24 hours: Temp:  [96.8 F (36 C)-100 F (37.8 C)] 99 F (37.2 C) (10/03 0900) Pulse Rate:  [75-97] 76 (10/03 0900) Cardiac Rhythm: Normal sinus rhythm (10/03 0800) Resp:  [12-33] 23 (10/03 0900) BP: (81-150)/(59-89) 110/78 (10/03 0900) SpO2:  [88 %-99 %] 94 % (10/03 0900) Arterial Line BP: (77-209)/(47-87) 150/61 (10/03 0900) FiO2 (%):  [40 %-50 %] 40 % (10/02 1643) Weight:  [124.1 kg] 124.1 kg (10/03 0500)  Filed Weights   03/09/19 0417 03/10/19 0300 03/11/19 0500  Weight: 121.6 kg 116.4 kg 124.1 kg    Weight change: 7.707 kg   Hemodynamic parameters for last 24 hours: PAP: (15-49)/(2-18) 32/9 CO:  [4.6 L/min-7.3 L/min] 6.6 L/min CI:  [2 L/min/m2-3.2 L/min/m2] 2.9 L/min/m2  Intake/Output from previous day: 10/02 0701 - 10/03 0700 In: 4533 [I.V.:3252.9; Blood:640; IV Piggyback:640.1] Out: [id number] [Urine:3015; Blood:785; Chest Tube:570]  Intake/Output this shift: No intake/output data recorded.  Current Meds: Scheduled Meds: . acetaminophen  1,000 mg Oral Q6H   Or  . acetaminophen (TYLENOL) oral liquid 160 mg/5 mL  1,000 mg Per Tube Q6H  . allopurinol  300 mg Oral q morning - 10a  . aspirin EC  325 mg Oral Daily   Or  . aspirin  324 mg Per Tube Daily  . atorvastatin  80 mg Oral q1800  . bisacodyl  10 mg Oral Daily   Or  . bisacodyl  10 mg Rectal Daily  . Chlorhexidine Gluconate Cloth  6 each Topical Q0600  . docusate sodium  200 mg Oral Daily  .  fluticasone  1 spray Each Nare Daily  . folic acid  1 mg Oral Daily  . insulin regular  0-10 Units Intravenous TID WC  . metoprolol tartrate  12.5 mg Oral BID   Or  . metoprolol tartrate  12.5 mg Per Tube BID  . mupirocin ointment  1 application Nasal BID  . [START ON 03/12/2019] pantoprazole  40 mg Oral Daily  . sodium chloride flush  3 mL Intravenous Q12H  . thiamine  100 mg Intravenous Daily   Continuous Infusions: . sodium chloride 10 mL/hr at 03/11/19 0700  . sodium chloride    . sodium chloride 10 mL/hr at 03/10/19 1428  . albumin human Stopped (03/10/19 1500)  . dexmedetomidine (PRECEDEX) IV infusion Stopped (03/10/19 1630)  . famotidine (PEPCID) IV Stopped (03/10/19 1444)  . insulin 2 mL/hr at 03/11/19 0700  . lactated ringers    . lactated ringers    . lactated ringers 20 mL/hr at 03/11/19 0700  . levofloxacin (LEVAQUIN) IV    . milrinone 0.25 mcg/kg/min (03/11/19 0815)  . nitroGLYCERIN Stopped (03/10/19 1607)  . phenylephrine (NEO-SYNEPHRINE) Adult infusion 5 mcg/min (03/11/19 0700)   PRN Meds:.sodium chloride, albumin human, ipratropium-albuterol, lactated ringers, metoprolol tartrate, midazolam, morphine injection, ondansetron (ZOFRAN) IV,  oxyCODONE, sodium chloride flush, traMADol  General appearance: alert, cooperative and no distress Neurologic: intact Heart: regular rate and rhythm, S1, S2 normal, no murmur, click, rub or gallop Lungs: diminished breath sounds bibasilar Abdomen: soft, non-tender; bowel sounds normal; no masses,  no organomegaly Extremities: extremities normal, atraumatic, no cyanosis or edema and Homans sign is negative, no sign of DVT Wound: sternum intact dressing in place  Lab Results: CBC: Recent Labs    03/10/19 1857 03/11/19 0409 03/11/19 0646  WBC 11.0* 14.8*  --   HGB 13.4 13.6 13.3  HCT 38.6* 40.1 39.0  PLT 133* 137*  --    BMET:  Recent Labs    03/10/19 1857 03/11/19 0409 03/11/19 0646  NA 137 134* 138  K 3.8 3.9 4.0   CL 107 105  --   CO2 22 21*  --   GLUCOSE 132* 134*  --   BUN 14 14  --   CREATININE 0.75 0.88  --   CALCIUM 7.7* 7.7*  --     CMET: Lab Results  Component Value Date   WBC 14.8 (H) 03/11/2019   HGB 13.3 03/11/2019   HCT 39.0 03/11/2019   PLT 137 (L) 03/11/2019   GLUCOSE 134 (H) 03/11/2019   CHOL 170 03/07/2019   TRIG 218 (H) 03/07/2019   HDL 41 03/07/2019   LDLCALC 85 03/07/2019   ALT 28 03/09/2019   AST 25 03/09/2019   NA 138 03/11/2019   K 4.0 03/11/2019   CL 105 03/11/2019   CREATININE 0.88 03/11/2019   BUN 14 03/11/2019   CO2 21 (L) 03/11/2019   TSH 6.931 (H) 03/09/2019   INR 1.4 (H) 03/10/2019   HGBA1C 5.6 03/09/2019      PT/INR:  Recent Labs    03/10/19 1355  LABPROT 16.6*  INR 1.4*   Radiology: Dg Chest 1 View  Result Date: 03/10/2019 CLINICAL DATA:  Status post CABG, rule out pneumo. EXAM: CHEST  1 VIEW COMPARISON:  03/07/2019 FINDINGS: Now status post median sternotomy and CABG. Swan-Ganz catheter in proximal right pulmonary artery. A transesophageal echocardiographic pro passes to the level of the T10 vertebral body. Seven intact sternal wires are noted. Left-sided chest tube in place without signs of pneumothorax on supine radiograph. Cardiomediastinal contours with mild widening superiorly likely secondary to postoperative change otherwise unchanged accounting for low lung volumes. There is a single tube in the midline projecting over sternal wires extending to the level of the sternal manubrium. This may represent mediastinal drain. IMPRESSION: 1. Status post CABG with left chest tube without visible pneumothorax. 2. Swan-Ganz catheter and transesophageal echocardiographic probe in place. 3. Midline drainage tubing projects over the mediastinum, potentially mediastinal drain, clinical correlation suggested. Electronically Signed   By: Zetta Bills M.D.   On: 03/10/2019 13:30   Dg Chest Port 1 View  Result Date: 03/11/2019 CLINICAL DATA:  CABG x3  03/10/2019. EXAM: PORTABLE CHEST 1 VIEW COMPARISON:  03/10/2019 FINDINGS: Right IJ Swan-Ganz catheter unchanged with tip over the right main pulmonary artery. Left-sided chest tube unchanged. Mediastinal drain unchanged. Lungs are adequately inflated with very minimal bibasilar density likely small amount of bilateral pleural fluid/atelectasis. Mild stable cardiomegaly. Remainder of the exam is unchanged. IMPRESSION: Suggestion of small amount of bilateral pleural fluid/atelectasis. Stable cardiomegaly. Tubes and lines as described. Electronically Signed   By: Marin Olp M.D.   On: 03/11/2019 08:46   Dg Chest Port 1 View  Result Date: 03/10/2019 CLINICAL DATA:  S/p CABG x 3, chest tube  in place, pt intubated. EXAM: PORTABLE CHEST 1 VIEW COMPARISON:  Chest radiograph 03/10/2019 at 1:09 p.m. FINDINGS: Stable enlarged cardiomediastinal contours status post median sternotomy. Interval intubation with endotracheal tube tip projecting between the thoracic inlet and carina. Stable remaining support apparatus including a PA catheter, enteric tube and left chest tube. The lungs are well aerated. No new focal pulmonary opacity. No pneumothorax or large pleural effusion. IMPRESSION: 1. Interval intubation with endotracheal tube tip projecting between the thoracic inlet and carina. 2. Postoperative appearance of the chest. Electronically Signed   By: Audie Pinto M.D.   On: 03/10/2019 15:08     Assessment/Plan: S/P Procedure(s) (LRB): CORONARY ARTERY BYPASS GRAFTING (CABG)X 3 Using Left Internal Mammary Artery and Right Saphenous Vein Grafts (N/A) TRANSESOPHAGEAL ECHOCARDIOGRAM (TEE) (N/A) Mobilize Diuresis Diabetes control d/c tubes/lines Continue foley due to strict I&O, patient in ICU and urinary output monitoring See progression orders Expected Acute  Blood - loss Anemia- continue to monitor     Grace Isaac 03/11/2019 9:17 AM

## 2019-03-11 NOTE — Progress Notes (Signed)
Placed pt back home unit cpap with 5 L bleed in.

## 2019-03-11 NOTE — Op Note (Signed)
NAME: Martin [name], Martin [name] MEDICAL RECORD [id number] ACCOUNT [id number] DATE OF BIRTH:[birth year] FACILITY: [facility] LOCATION: [facility] PHYSICIAN:PETER VAN TRIGT III, MD  OPERATIVE REPORT  DATE OF PROCEDURE:  03/10/2019  OPERATION: 1.  Coronary artery bypass grafting x3 (left internal mammary artery to left anterior descending, saphenous vein graft to ramus intermedius, saphenous vein graft to posterior descending). 2.  Endoscopic harvest of right leg greater saphenous vein.  SURGEON:  Len Childs, MD  ASSISTANT:  Jadene Pierini, PA-C  ANESTHESIA:  General by Roberts Gaudy, MD  PREOPERATIVE DIAGNOSES:  Severe 3-vessel coronary artery disease with history of previous percutaneous coronary intervention, recent non-ST elevation myocardial infarction with unstable angina.  POSTOPERATIVE DIAGNOSES:   Severe 3-vessel coronary artery disease with history of previous percutaneous coronary intervention, recent non-ST elevation myocardial infarction with unstable angina.  CLINICAL NOTE:  The patient is a 63 year old obese male with history of coronary artery disease who presented with symptoms of unstable angina and mildly elevated cardiac enzymes.  Cardiac catheterization was performed which showed severe recurrent  coronary disease since his previous cath, at which time a stent was placed in the right coronary and LAD.  Surgical coronary revascularization was recommended by his cardiologist.  I examined the patient after reviewing the coronary angiograms and  discussed the procedure of CABG, which I also felt would provide him the best long-term treatment for his severe coronary disease.  I discussed the major aspects of the procedure including the use of general anesthesia and cardiopulmonary bypass, the  location of the surgical incisions, and the expected postoperative hospital recovery.  I discussed with the patient and wife the risks to him of the surgery including the risk of a stroke,  bleeding, infection, blood transfusion requirement, postoperative  organ failure, postoperative pulmonary problems due to his history of smoking, and death.  After reviewing these issues, he demonstrated his understanding and agreed to proceed with surgery under what I felt was an informed consent.  OPERATIVE FINDINGS: 1.  Severe diffuse coronary artery disease, the LAD and ramus vessels were adequate targets, the posterior descending was diffusely diseased and suboptimal, but graftable. 2.  No blood products required. 3.  Preservation of normal LV function after separation from cardiopulmonary bypass.  DESCRIPTION OF PROCEDURE:  The patient was brought from the preop holding where preoperative consent was documented and final issues addressed with the patient.  The patient was placed supine on the operating table, and general anesthesia was induced  under invasive hemodynamic monitoring.  A transesophageal echo probe was placed by the anesthesia team.  The patient was prepped and draped as a sterile field after a Foley catheter was placed.  A proper timeout was performed.  A sternal incision was  made as the saphenous vein was harvested endoscopically from the right leg.  The left internal mammary artery was harvested as a pedicle graft from its origin at the subclavian vessels.  Exposure of the intrathoracic contents was difficult because of the  patient's short stature, obesity, and body habitus.  The sternal retractor was placed using the deep blades.  The pericardium was opened and suspended.  Pursestrings were placed in the ascending aorta and right atrium, and the patient was heparinized.  The patient was cannulated and placed on  cardiopulmonary bypass.  The coronaries were identified for grafting, and the mammary artery and vein grafts were prepared for the distal anastomoses.  Cardioplegic cannulas were placed, both antegrade and retrograde cold blood cardioplegia.  The patient  was cooled  to 32 degrees.  The aortic crossclamp was applied.  A liter of cold blood cardioplegia was delivered in split doses between the antegrade aortic and retrograde coronary sinus catheters.  There was good cardioplegic arrest, and supple  temperature dropped less than 14 degrees.  Cardioplegia was delivered every 20 minutes.  The distal coronary anastomoses were performed.  The first distal anastomosis was the posterior descending branch of the occluded right coronary.  This was a 1.5 mm vessel.  He had diffuse disease.  A reverse saphenous vein was sewn end-to-side with  running 7-0 Prolene with good flow through the graft.  Cardioplegia was redosed.  The second distal anastomosis was the ramus intermedius branch of the left coronary.  This was a 1.7 mm vessel, proximal 90% stenosis.  It was intramyocardial.  A reverse saphenous vein was sewn end-to-side with running 7-0 Prolene with good flow through  the graft.  Cardioplegia was redosed.  The third distal anastomosis was the distal LAD.  There was a proximal 90% stenosis.  The left IMA pedicle was brought through an opening in the left lateral pericardium and was brought down onto the LAD and sewn end-to-side with running 7-0 Prolene.   There was good flow through the anastomosis after briefly releasing the pedicle bulldog and the mammary artery.  The bulldog was reapplied, and the pedicle was secured to the epicardium with 6-0 Prolenes.  Cardioplegia was redosed.  With the crossclamp in place, 2 proximal vein anastomoses were performed on the ascending aorta using a 4.5 mm punch and running 6-0 Prolene.  Prior to tying down the final proximal anastomosis, air was vented from the coronaries with a dose of  retrograde warm blood cardioplegia.  The crossclamp was removed.  The vein grafts were deaired and opened and each had good flow, and hemostasis was documented at the proximal and distal anastomoses.  Cardioplegic cannula was removed.  Pacing wires  were applied.  The patient was reperfused and rewarmed.  Lungs were  expanded.  The ventilator was resumed.  The patient was weaned off cardiopulmonary bypass on minimal inotropic support with good hemodynamics.  Cardiac output was normal.  Echo showed preservation of normal LV systolic function.  Protamine was administered without adverse reaction.  The cannulas were removed.  The mediastinum was irrigated.  The superior pericardial fat was closed over the aorta and right ventricle.  The anterior mediastinum and left pleural chest tubes were  placed and brought out through separate incisions.  The sternum was closed with wire.  The pectoralis fascia was closed with a running #1 Vicryl.  Subcutaneous and skin layers were closed with Vicryl in a standard fashion.  Sterile dressings were  applied, and the patient was transported back to the ICU in stable condition.  Total cardiopulmonary bypass time was 120 minutes.  LN/NUANCE  D:03/10/2019 T:03/11/2019 JOB:[id number]

## 2019-03-12 ENCOUNTER — Inpatient Hospital Stay (HOSPITAL_COMMUNITY): Payer: Medicare Other

## 2019-03-12 LAB — CBC
HCT: 35.4 % — ABNORMAL LOW (ref 39.0–52.0)
HCT: 35.9 % — ABNORMAL LOW (ref 39.0–52.0)
Hemoglobin: 11.8 g/dL — ABNORMAL LOW (ref 13.0–17.0)
Hemoglobin: 12.1 g/dL — ABNORMAL LOW (ref 13.0–17.0)
MCH: 33.6 pg (ref 26.0–34.0)
MCH: 34.1 pg — ABNORMAL HIGH (ref 26.0–34.0)
MCHC: 33.3 g/dL (ref 30.0–36.0)
MCHC: 33.7 g/dL (ref 30.0–36.0)
MCV: 100.9 fL — ABNORMAL HIGH (ref 80.0–100.0)
MCV: 101.1 fL — ABNORMAL HIGH (ref 80.0–100.0)
Platelets: 130 10*3/uL — ABNORMAL LOW (ref 150–400)
Platelets: 122 10*3/uL — ABNORMAL LOW (ref 150–400)
RBC: 3.51 MIL/uL — ABNORMAL LOW (ref 4.22–5.81)
RBC: 3.55 MIL/uL — ABNORMAL LOW (ref 4.22–5.81)
RDW: 13.4 % (ref 11.5–15.5)
RDW: 13.3 % (ref 11.5–15.5)
WBC: 14.5 10*3/uL — ABNORMAL HIGH (ref 4.0–10.5)
WBC: 15.3 10*3/uL — ABNORMAL HIGH (ref 4.0–10.5)
nRBC: 0 % (ref 0.0–0.2)
nRBC: 0 % (ref 0.0–0.2)

## 2019-03-12 LAB — BASIC METABOLIC PANEL
Anion gap: 11 (ref 5–15)
BUN: 27 mg/dL — ABNORMAL HIGH (ref 8–23)
CO2: 24 mmol/L (ref 22–32)
Calcium: 8.2 mg/dL — ABNORMAL LOW (ref 8.9–10.3)
Chloride: 98 mmol/L (ref 98–111)
Creatinine, Ser: 1.81 mg/dL — ABNORMAL HIGH (ref 0.61–1.24)
GFR calc Af Amer: 44 mL/min — ABNORMAL LOW (ref >60)
GFR calc non Af Amer: 38 mL/min — ABNORMAL LOW (ref >60)
Glucose, Bld: 153 mg/dL — ABNORMAL HIGH (ref 70–99)
Potassium: 4.1 mmol/L (ref 3.5–5.1)
Sodium: 133 mmol/L — ABNORMAL LOW (ref 135–145)

## 2019-03-12 LAB — HEPATIC FUNCTION PANEL
ALT: 26 U/L (ref 0–44)
AST: 35 U/L (ref 15–41)
Albumin: 3.1 g/dL — ABNORMAL LOW (ref 3.5–5.0)
Alkaline Phosphatase: 50 U/L (ref 38–126)
Bilirubin, Direct: 0.5 mg/dL — ABNORMAL HIGH (ref 0.0–0.2)
Indirect Bilirubin: 1.2 mg/dL — ABNORMAL HIGH (ref 0.3–0.9)
Total Bilirubin: 1.7 mg/dL — ABNORMAL HIGH (ref 0.3–1.2)
Total Protein: 6 g/dL — ABNORMAL LOW (ref 6.5–8.1)

## 2019-03-12 LAB — COMPREHENSIVE METABOLIC PANEL
ALT: 25 U/L (ref 0–44)
AST: 33 U/L (ref 15–41)
Albumin: 3 g/dL — ABNORMAL LOW (ref 3.5–5.0)
Alkaline Phosphatase: 50 U/L (ref 38–126)
Anion gap: 9 (ref 5–15)
BUN: 27 mg/dL — ABNORMAL HIGH (ref 8–23)
CO2: 24 mmol/L (ref 22–32)
Calcium: 8.2 mg/dL — ABNORMAL LOW (ref 8.9–10.3)
Chloride: 99 mmol/L (ref 98–111)
Creatinine, Ser: 2 mg/dL — ABNORMAL HIGH (ref 0.61–1.24)
GFR calc Af Amer: 39 mL/min — ABNORMAL LOW (ref >60)
GFR calc non Af Amer: 34 mL/min — ABNORMAL LOW (ref >60)
Glucose, Bld: 181 mg/dL — ABNORMAL HIGH (ref 70–99)
Potassium: 4 mmol/L (ref 3.5–5.1)
Sodium: 132 mmol/L — ABNORMAL LOW (ref 135–145)
Total Bilirubin: 1.2 mg/dL (ref 0.3–1.2)
Total Protein: 5.9 g/dL — ABNORMAL LOW (ref 6.5–8.1)

## 2019-03-12 LAB — GLUCOSE, CAPILLARY
Glucose-Capillary: 135 mg/dL — ABNORMAL HIGH (ref 70–99)
Glucose-Capillary: 180 mg/dL — ABNORMAL HIGH (ref 70–99)
Glucose-Capillary: 163 mg/dL — ABNORMAL HIGH (ref 70–99)
Glucose-Capillary: 155 mg/dL — ABNORMAL HIGH (ref 70–99)

## 2019-03-12 LAB — MAGNESIUM: Magnesium: 2.1 mg/dL (ref 1.7–2.4)

## 2019-03-12 LAB — DIGOXIN LEVEL: Digoxin Level: <0.2 ng/mL — ABNORMAL LOW (ref 0.8–2.0)

## 2019-03-12 LAB — TSH: TSH: 10.69 u[IU]/mL — ABNORMAL HIGH (ref 0.350–4.500)

## 2019-03-12 MED ORDER — VITAMIN B-12 100 MCG PO TABS
100.0000 ug | ORAL_TABLET | Freq: Every day | ORAL | Status: DC
  Administered 2019-03-12 – 2019-03-27 (×16): 100 ug via ORAL
  Filled 2019-03-12 (×16): qty 1

## 2019-03-12 MED ORDER — AMIODARONE HCL 200 MG PO TABS: 200.0000 mg | ORAL_TABLET | Freq: Two times a day (BID) | ORAL | Status: DC

## 2019-03-12 MED ORDER — FUROSEMIDE 10 MG/ML IJ SOLN
40.0000 mg | Freq: Once | INTRAMUSCULAR | Status: AC
  Administered 2019-03-12: 07:00:00 40 mg via INTRAVENOUS
  Filled 2019-03-12: qty 4

## 2019-03-12 MED ORDER — AMIODARONE HCL IN DEXTROSE 360-4.14 MG/200ML-% IV SOLN
30.0000 mg/h | INTRAVENOUS | Status: DC
  Administered 2019-03-12 – 2019-03-17 (×10): 30 mg/h via INTRAVENOUS
  Filled 2019-03-12 (×14): qty 200

## 2019-03-12 MED ORDER — AMIODARONE HCL 200 MG PO TABS: 200.0000 mg | ORAL_TABLET | Freq: Every day | ORAL | Status: DC

## 2019-03-12 MED ORDER — ALPRAZOLAM 0.25 MG PO TABS
0.2500 mg | ORAL_TABLET | Freq: Three times a day (TID) | ORAL | Status: DC | PRN
  Administered 2019-03-12 – 2019-03-22 (×9): 0.25 mg via ORAL
  Filled 2019-03-12 (×11): qty 1

## 2019-03-12 MED ORDER — AMIODARONE HCL IN DEXTROSE 360-4.14 MG/200ML-% IV SOLN
60.0000 mg/h | INTRAVENOUS | Status: AC
  Administered 2019-03-12 (×4): 60 mg/h via INTRAVENOUS
  Filled 2019-03-12: qty 200

## 2019-03-12 MED ORDER — AMIODARONE LOAD VIA INFUSION
150.0000 mg | Freq: Once | INTRAVENOUS | Status: AC
  Administered 2019-03-12: 08:00:00 150 mg via INTRAVENOUS
  Filled 2019-03-12: qty 83.34

## 2019-03-12 MED ORDER — AMIODARONE LOAD VIA INFUSION
150.0000 mg | Freq: Once | INTRAVENOUS | Status: AC
  Administered 2019-03-12: 07:00:00 150 mg via INTRAVENOUS
  Filled 2019-03-12: qty 83.34

## 2019-03-12 MED ORDER — AMIODARONE HCL IN DEXTROSE 360-4.14 MG/200ML-% IV SOLN
INTRAVENOUS | Status: AC
  Administered 2019-03-12: 07:00:00 150 mg via INTRAVENOUS
  Filled 2019-03-12: qty 200

## 2019-03-12 MED ORDER — FUROSEMIDE 10 MG/ML IJ SOLN
80.0000 mg | Freq: Once | INTRAMUSCULAR | Status: AC
  Administered 2019-03-12: 21:00:00 80 mg via INTRAVENOUS
  Filled 2019-03-12: qty 8

## 2019-03-12 NOTE — Progress Notes (Signed)
Progress Note  Patient Name: Martin [name] Date of Encounter: 03/12/2019  Primary Cardiologist: Kate Sable, MD   Subjective   Incisional pain.  He went back into atrial fibrillation but denies palpitations, chest pain or shortness of breath.   Inpatient Medications    Scheduled Meds: . acetaminophen  1,000 mg Oral Q6H   Or  . acetaminophen (TYLENOL) oral liquid 160 mg/5 mL  1,000 mg Per Tube Q6H  . allopurinol  300 mg Oral q morning - 10a  . amiodarone  200 mg Oral Q12H   Followed by  . [START ON 03/20/2019] amiodarone  200 mg Oral Daily  . aspirin EC  325 mg Oral Daily   Or  . aspirin  324 mg Per Tube Daily  . atorvastatin  80 mg Oral q1800  . bisacodyl  10 mg Oral Daily   Or  . bisacodyl  10 mg Rectal Daily  . Chlorhexidine Gluconate Cloth  6 each Topical Q0600  . docusate sodium  200 mg Oral Daily  . enoxaparin (LOVENOX) injection  40 mg Subcutaneous QHS  . fluticasone  1 spray Each Nare Daily  . folic acid  1 mg Oral Daily  . insulin aspart  0-24 Units Subcutaneous Q4H  . insulin detemir  10 Units Subcutaneous Daily  . insulin regular  0-10 Units Intravenous TID WC  . metoprolol tartrate  12.5 mg Oral BID   Or  . metoprolol tartrate  12.5 mg Per Tube BID  . mupirocin ointment  1 application Nasal BID  . pantoprazole  40 mg Oral Daily  . sodium chloride flush  3 mL Intravenous Q12H  . thiamine  100 mg Intravenous Daily   Continuous Infusions: . sodium chloride Stopped (03/11/19 1110)  . sodium chloride    . sodium chloride 10 mL/hr at 03/10/19 1428  . amiodarone 60 mg/hr (03/12/19 0728)   Followed by  . amiodarone    . dexmedetomidine (PRECEDEX) IV infusion Stopped (03/10/19 1630)  . insulin Stopped (03/11/19 1525)  . lactated ringers    . lactated ringers    . lactated ringers Stopped (03/11/19 1648)  . milrinone 0.25 mcg/kg/min (03/12/19 0848)  . nitroGLYCERIN Stopped (03/10/19 1607)  . phenylephrine (NEO-SYNEPHRINE) Adult infusion Stopped  (03/11/19 0914)   PRN Meds: sodium chloride, ipratropium-albuterol, lactated ringers, metoprolol tartrate, midazolam, morphine injection, ondansetron (ZOFRAN) IV, oxyCODONE, sodium chloride flush, traMADol   Vital Signs    Vitals:   03/12/19 0638 03/12/19 0645 03/12/19 0700 03/12/19 0740  BP: 99/73 (!) 95/55 94/76 (!) 100/58  Pulse: (!) 137 (!) 118 94 (!) 120  Resp: (!) 32 (!) 24 (!) 21 20  Temp:    98.5 F (36.9 C)  TempSrc:    Oral  SpO2: 98% (!) 84% 90% 91%  Weight:      Height:        Intake/Output Summary (Last 24 hours) at 03/12/2019 0953 Last data filed at 03/12/2019 0800 Gross per 24 hour  Intake 1184.36 ml  Output 935 ml  Net 249.36 ml   Last 3 Weights 03/12/2019 03/11/2019 03/10/2019  Weight (lbs) 269 lb 10 oz 273 lb 9.5 oz 256 lb 9.6 oz  Weight (kg) 122.3 kg 124.1 kg 116.393 kg      Telemetry    Atrial fibirllation. Rate 110s-120s.- Personally Reviewed  ECG    03/11/2019: Sinus rhythm.  Rate 86 bpm.  Left axis deviation.  Nonspecific T wave abnormalities.- Personally Reviewed  Physical Exam   VS:  BP Marland Kitchen)  100/58 (BP Location: Left Arm)   Pulse (!) 120   Temp 98.5 F (36.9 C) (Oral)   Resp 20   Ht 5\' 8"  (1.727 m)   Wt 122.3 kg   SpO2 91%   BMI 41.00 kg/m  , BMI Body mass index is 41 kg/m.   CVP 13  GENERAL:  Well appearing.  Mild distress HEENT: Pupils equal round and reactive, fundi not visualized, oral mucosa unremarkable NECK:  + jugular venous distention, waveform within normal limits, carotid upstroke brisk and symmetric, no bruits LUNGS:  Clear to auscultation bilaterally HEART:  Tachycardic.  Irregularly irregular.  PMI not displaced or sustained,S1 and S2 within normal limits, no S3, no S4, no clicks, no rubs, no murmurs ABD:  Flat, positive bowel sounds normal in frequency in pitch, no bruits, no rebound, no guarding, no midline pulsatile mass, no hepatomegaly, no splenomegaly EXT:  2 plus pulses throughout, 1+ LE edema, no cyanosis no  clubbing.  R LE wrapped. SKIN:  No rashes no nodules NEURO:  Cranial nerves II through XII grossly intact, motor grossly intact throughout PSYCH:  Cognitively intact, oriented to person place and time  Labs    High Sensitivity Troponin:   Recent Labs  Lab 03/07/19 1416 03/07/19 1611 03/07/19 2358 03/08/19 0217  TROPONINIHS 71* 82* 69* 57*      Chemistry Recent Labs  Lab 03/09/19 0430  03/11/19 1712 03/12/19 0335 03/12/19 0653 03/12/19 0803  NA 139   < > 134* 133* 132*  --   K 4.2   < > 4.2 4.1 4.0  --   CL 108   < > 101 98 99  --   CO2 23   < > 23 24 24   --   GLUCOSE 118*   < > 174* 153* 181*  --   BUN 16   < > 20 27* 27*  --   CREATININE 0.87   < > 1.42* 1.81* 2.00*  --   CALCIUM 9.2   < > 8.3* 8.2* 8.2*  --   PROT 6.7  --   --   --  5.9* 6.0*  ALBUMIN 3.9  --   --   --  3.0* 3.1*  AST 25  --   --   --  33 35  ALT 28  --   --   --  25 26  ALKPHOS 56  --   --   --  50 50  BILITOT 1.1  --   --   --  1.2 1.7*  GFRNONAA >60   < > 51* 38* 34*  --   GFRAA >60   < > 60* 44* 39*  --   ANIONGAP 8   < > 10 11 9   --    < > = values in this interval not displayed.     Hematology Recent Labs  Lab 03/11/19 1712 03/12/19 0335 03/12/19 0653  WBC 17.7* 14.5* 15.3*  RBC 3.93* 3.51* 3.55*  HGB 13.1 11.8* 12.1*  HCT 40.2 35.4* 35.9*  MCV 102.3* 100.9* 101.1*  MCH 33.3 33.6 34.1*  MCHC 32.6 33.3 33.7  RDW 13.3 13.4 13.3  PLT 158 130* 122*    BNP Recent Labs  Lab 03/07/19 2358  BNP 320.2*     DDimer No results for input(s): DDIMER in the last 168 hours.   Radiology    Dg Chest 1 View  Result Date: 03/10/2019 CLINICAL DATA:  Status post CABG, rule out pneumo. EXAM: CHEST  1  VIEW COMPARISON:  03/07/2019 FINDINGS: Now status post median sternotomy and CABG. Swan-Ganz catheter in proximal right pulmonary artery. A transesophageal echocardiographic pro passes to the level of the T10 vertebral body. Seven intact sternal wires are noted. Left-sided chest tube in place  without signs of pneumothorax on supine radiograph. Cardiomediastinal contours with mild widening superiorly likely secondary to postoperative change otherwise unchanged accounting for low lung volumes. There is a single tube in the midline projecting over sternal wires extending to the level of the sternal manubrium. This may represent mediastinal drain. IMPRESSION: 1. Status post CABG with left chest tube without visible pneumothorax. 2. Swan-Ganz catheter and transesophageal echocardiographic probe in place. 3. Midline drainage tubing projects over the mediastinum, potentially mediastinal drain, clinical correlation suggested. Electronically Signed   By: Zetta Bills M.D.   On: 03/10/2019 13:30   Dg Chest Port 1 View  Result Date: 03/12/2019 CLINICAL DATA:  Status post surgery. EXAM: PORTABLE CHEST 1 VIEW COMPARISON:  03/11/2019 FINDINGS: Interval removal of the Swan-Ganz catheter. Right jugular central venous sheath remains in place. Left-sided chest tube in unchanged position. No left pneumothorax. Mild left basilar atelectasis. Right basilar airspace disease likely reflecting atelectasis with a small right pleural effusion. No right pneumothorax. Stable cardiomediastinal silhouette. Recent CABG. IMPRESSION: 1. Left-sided chest tube in unchanged position. No left pneumothorax. Mild left basilar atelectasis. 2. Right basilar atelectasis with a small right pleural effusion. Electronically Signed   By: Kathreen Devoid   On: 03/12/2019 09:11   Dg Chest Port 1 View  Result Date: 03/11/2019 CLINICAL DATA:  CABG x3 03/10/2019. EXAM: PORTABLE CHEST 1 VIEW COMPARISON:  03/10/2019 FINDINGS: Right IJ Swan-Ganz catheter unchanged with tip over the right main pulmonary artery. Left-sided chest tube unchanged. Mediastinal drain unchanged. Lungs are adequately inflated with very minimal bibasilar density likely small amount of bilateral pleural fluid/atelectasis. Mild stable cardiomegaly. Remainder of the exam is  unchanged. IMPRESSION: Suggestion of small amount of bilateral pleural fluid/atelectasis. Stable cardiomegaly. Tubes and lines as described. Electronically Signed   By: Marin Olp M.D.   On: 03/11/2019 08:46   Dg Chest Port 1 View  Result Date: 03/10/2019 CLINICAL DATA:  S/p CABG x 3, chest tube in place, pt intubated. EXAM: PORTABLE CHEST 1 VIEW COMPARISON:  Chest radiograph 03/10/2019 at 1:09 p.m. FINDINGS: Stable enlarged cardiomediastinal contours status post median sternotomy. Interval intubation with endotracheal tube tip projecting between the thoracic inlet and carina. Stable remaining support apparatus including a PA catheter, enteric tube and left chest tube. The lungs are well aerated. No new focal pulmonary opacity. No pneumothorax or large pleural effusion. IMPRESSION: 1. Interval intubation with endotracheal tube tip projecting between the thoracic inlet and carina. 2. Postoperative appearance of the chest. Electronically Signed   By: Audie Pinto M.D.   On: 03/10/2019 15:08    Cardiac Studies   Echo 03/08/19: IMPRESSIONS   1. Left ventricular ejection fraction, by visual estimation, is 55 to 60%. The left ventricle has normal function. Normal left ventricular size. There is mildly increased left ventricular hypertrophy. Basal inferior akinesis.  2. Left ventricular diastolic Doppler parameters are consistent with impaired relaxation pattern of LV diastolic filling.  3. Global right ventricle has normal systolic function.The right ventricular size is normal. No increase in right ventricular wall thickness.  4. The aortic valve is tricuspid Aortic valve regurgitation is trivial by color flow Doppler. Mild aortic valve stenosis. Mean gradient 10 mmHg.  5. There is mild dilatation of the ascending aorta measuring 42 mm.  6. Left atrial size was moderately dilated.  7. Right atrial size was mildly dilated.  8. The tricuspid valve is normal in structure. Tricuspid valve  regurgitation was not visualized by color flow Doppler.  9. The mitral valve is normal in structure. No evidence of mitral valve regurgitation. No evidence of mitral stenosis. 10. TR signal is inadequate for assessing pulmonary artery systolic pressure. 11. The inferior vena cava is normal in size with greater than 50% respiratory variability, suggesting right atrial pressure of 3 mmHg.  Carotid Dopplers 03/09/2019: 1 to 39% ICA stenosis bilaterally.  Left heart cath 03/08/2019:  Severe three-vessel coronary calcification  Patent left main  Eccentric ostial 50 to 75% LAD with mid vessel 40 to 50% narrowing.  Mid to distal LAD stent is widely patent.  Ramus intermedius contains proximal 90% stenosis.  Circumflex contains 99% proximal/ostial stenosis.  RCA is heavily calcified and stented.  The artery is totally occluded in the proximal to mid segment.  Left-to-right collaterals are noted.  Mildly reduced LV systolic function with EF estimated to be 40%.  LVEDP is 28 mmHg.  Findings compatible with acute on chronic combined   Patient Profile     63 y.o. male with CAD status post LAD PCI, paroxysmal atrial fibrillation, hypertension, and OSA on CPAP admitted with NSTEMI.  Assessment & Plan    # NSTEMI: # s/p CABG:  Left heart cath revealed severe ostial LAD, severe ostial ramus, and severe ostial left circumflex disease.  RCA was completely occluded.  He underwent CABG with Dr. Darcey Nora on 10/2.  Continue aspirin and atorvastatin.  Remains on milrinone.  Titrate beta blocker when able.  # VT:  Mr. [place] had a 27 beat (11 sec) run of VT at a rate of around 150 bpm. Continue metoprolol and titrate as BP allows.   # Hypertension: Home meds on hold.  Weaning midodrine per CT surgery.  # PAF:  Went into atrial fibrillation 10/4 with rates in the 110s-120s this AM.  Now on amiodarone.  Home Eliquis on hold.  Will resume or add heparin when able per CT surgery.  His BP is low.  We can  give digoxin as needed for rate control.  # Acute renal failure: Creatinine up to 2 today.  However CVP was 13.  He received one dose of IV lasix.  Will continue to monitor closely. Lisinopril on hold.  # DM:  On insulin.  # Leukocytosis:  WBC continues to rise.  He reports green phlegm.  No pneumonia on CXR.  He received one dose of levofloxacin 10/3.   # Subclinical hypothyroidism: TSH 10.  However fT4 is within normal limits.  Continue to monitor.       Time spent: 35 minutes-Greater than 50% of this time was spent in counseling, explanation of diagnosis, planning of further management, and coordination of care.   For questions or updates, please contact [place] Please consult [email or web address] for contact info under        Signed, Skeet Latch, MD  03/12/2019, 9:53 AM

## 2019-03-12 NOTE — Progress Notes (Signed)
Gerhardt MD notified about pt converting into AFIB while ambulating. MD notified about pt's labs, heart rate in 130s, oxygen in mid 80s, pt placed on high flow oxygen, and pacer converted from AAI to VVI mode. MD gave verbal orders for amiodarone gtt per protocol, 150 mg bolus first, and labs to be redrawn. Will continue to monitor.

## 2019-03-12 NOTE — Progress Notes (Addendum)
Patient ID: Martin [name], male   DOB: [birth year], 63 y.o.   MRN: [id number] T[facility] DAILY ICU PROGRESS NOTE                   [address]            [place] [address]          [phone]   2 Days Post-Op Procedure(s) (LRB): CORONARY ARTERY BYPASS GRAFTING (CABG)X 3 Using Left Internal Mammary Artery and Right Saphenous Vein Grafts (N/A) TRANSESOPHAGEAL ECHOCARDIOGRAM (TEE) (N/A)  Total Length of Stay:  LOS: 5 days   Subjective: Patient awake and neurologically intact on nasal cannula, developed atrial fibrillation during the night has been started on amiodarone for rhythm  Objective: Vital signs in last 24 hours: Temp:  [98.5 F (36.9 C)-99.1 F (37.3 C)] 98.5 F (36.9 C) (10/04 0740) Pulse Rate:  [70-137] 120 (10/04 0740) Cardiac Rhythm: Normal sinus rhythm;Atrial paced (10/04 0400) Resp:  [8-32] 20 (10/04 0740) BP: (70-140)/(39-76) 100/58 (10/04 0740) SpO2:  [81 %-98 %] 91 % (10/04 0740) Arterial Line BP: (111-148)/(49-68) 148/68 (10/03 1120) Weight:  [122.3 kg] 122.3 kg (10/04 0500)  Filed Weights   03/10/19 0300 03/11/19 0500 03/12/19 0500  Weight: 116.4 kg 124.1 kg 122.3 kg    Weight change: -1.8 kg   Hemodynamic parameters for last 24 hours: PAP: (19-33)/(9-24) 33/21 CVP:  [11 mmHg] 11 mmHg  Intake/Output from previous day: 10/03 0701 - 10/04 0700 In: 1375.4 [P.O.:900; I.V.:475.4] Out: 910 [Urine:490; Chest Tube:420]  Intake/Output this shift: Total I/O In: 137.5 [P.O.:120; I.V.:17.5] Out: 125 [Urine:125]  Current Meds: Scheduled Meds: . acetaminophen  1,000 mg Oral Q6H   Or  . acetaminophen (TYLENOL) oral liquid 160 mg/5 mL  1,000 mg Per Tube Q6H  . allopurinol  300 mg Oral q morning - 10a  . amiodarone  200 mg Oral Q12H   Followed by  . [START ON 03/20/2019] amiodarone  200 mg Oral Daily  . aspirin EC  325 mg Oral Daily   Or  . aspirin  324 mg Per Tube Daily  . atorvastatin  80 mg Oral q1800  . bisacodyl  10 mg Oral Daily    Or  . bisacodyl  10 mg Rectal Daily  . Chlorhexidine Gluconate Cloth  6 each Topical Q0600  . docusate sodium  200 mg Oral Daily  . enoxaparin (LOVENOX) injection  40 mg Subcutaneous QHS  . fluticasone  1 spray Each Nare Daily  . folic acid  1 mg Oral Daily  . insulin aspart  0-24 Units Subcutaneous Q4H  . insulin detemir  10 Units Subcutaneous Daily  . insulin regular  0-10 Units Intravenous TID WC  . metoprolol tartrate  12.5 mg Oral BID   Or  . metoprolol tartrate  12.5 mg Per Tube BID  . mupirocin ointment  1 application Nasal BID  . pantoprazole  40 mg Oral Daily  . sodium chloride flush  3 mL Intravenous Q12H  . thiamine  100 mg Intravenous Daily   Continuous Infusions: . sodium chloride Stopped (03/11/19 1110)  . sodium chloride    . sodium chloride 10 mL/hr at 03/10/19 1428  . amiodarone 60 mg/hr (03/12/19 0728)   Followed by  . amiodarone    . dexmedetomidine (PRECEDEX) IV infusion Stopped (03/10/19 1630)  . insulin Stopped (03/11/19 1525)  . lactated ringers    . lactated ringers    . lactated ringers Stopped (03/11/19 1648)  . milrinone 0.25  mcg/kg/min (03/12/19 0848)  . nitroGLYCERIN Stopped (03/10/19 1607)  . phenylephrine (NEO-SYNEPHRINE) Adult infusion Stopped (03/11/19 0914)   PRN Meds:.sodium chloride, ipratropium-albuterol, lactated ringers, metoprolol tartrate, midazolam, morphine injection, ondansetron (ZOFRAN) IV, oxyCODONE, sodium chloride flush, traMADol  General appearance: alert, cooperative and no distress Neurologic: intact Heart: irregularly irregular rhythm Lungs: rhonchi bilaterally Abdomen: soft, non-tender; bowel sounds normal; no masses,  no organomegaly Extremities: extremities normal, atraumatic, no cyanosis or edema and Homans sign is negative, no sign of DVT Wound: Sternum stable  Lab Results: CBC: Recent Labs    03/12/19 0335 03/12/19 0653  WBC 14.5* 15.3*  HGB 11.8* 12.1*  HCT 35.4* 35.9*  PLT 130* 122*   BMET:  Recent  Labs    03/12/19 0335 03/12/19 0653  NA 133* 132*  K 4.1 4.0  CL 98 99  CO2 24 24  GLUCOSE 153* 181*  BUN 27* 27*  CREATININE 1.81* 2.00*  CALCIUM 8.2* 8.2*    CMET: Lab Results  Component Value Date   WBC 15.3 (H) 03/12/2019   HGB 12.1 (L) 03/12/2019   HCT 35.9 (L) 03/12/2019   PLT 122 (L) 03/12/2019   GLUCOSE 181 (H) 03/12/2019   CHOL 170 03/07/2019   TRIG 218 (H) 03/07/2019   HDL 41 03/07/2019   LDLCALC 85 03/07/2019   ALT 26 03/12/2019   AST 35 03/12/2019   NA 132 (L) 03/12/2019   K 4.0 03/12/2019   CL 99 03/12/2019   CREATININE 2.00 (H) 03/12/2019   BUN 27 (H) 03/12/2019   CO2 24 03/12/2019   TSH 10.690 (H) 03/12/2019   INR 1.4 (H) 03/10/2019   HGBA1C 5.6 03/09/2019      PT/INR:  Recent Labs    03/10/19 1355  LABPROT 16.6*  INR 1.4*   Radiology: Dg Chest Port 1 View  Result Date: 03/12/2019 CLINICAL DATA:  Status post surgery. EXAM: PORTABLE CHEST 1 VIEW COMPARISON:  03/11/2019 FINDINGS: Interval removal of the Swan-Ganz catheter. Right jugular central venous sheath remains in place. Left-sided chest tube in unchanged position. No left pneumothorax. Mild left basilar atelectasis. Right basilar airspace disease likely reflecting atelectasis with a small right pleural effusion. No right pneumothorax. Stable cardiomediastinal silhouette. Recent CABG. IMPRESSION: 1. Left-sided chest tube in unchanged position. No left pneumothorax. Mild left basilar atelectasis. 2. Right basilar atelectasis with a small right pleural effusion. Electronically Signed   By: Kathreen Devoid   On: 03/12/2019 09:11   Acute Kidney Injury (any one)  Increase in SCr by > 0.3 within 48 hours  Increase SCr to > 1.5 times baseline  Urine volume < 0.5 ml/kg/h for 6 hrs  ?Stage 1 - Increase in serum creatinine to 1.5 to 1.9 times baseline, or increase in serum creatinine by ?0.3 mg/dL (?26.5 micromol/L), or reduction in urine output to <0.5 mL/kg per hour for 6 to 12 hours.  ?Stage 2 -  Increase in serum creatinine to 2.0 to 2.9 times baseline, or reduction in urine output to <0.5 mL/kg per hour for ?12 hours.  ?Stage 3 - Increase in serum creatinine to 3.0 times baseline, or increase in serum creatinine to ?4.0 mg/dL (?353.6 micromol/L), or reduction in urine output to <0.3 mL/kg per hour for ?24 hours, or anuria for ?12 hours, or the initiation of renal replacement therapy, or, in patients <18 years, decrease in eGFR to <35 mL   Lab Results  Component Value Date   CREATININE 2.00 (H) 03/12/2019   Estimated Creatinine Clearance: 46.9 mL/min (A) (by C-G formula  based on SCr of 2 mg/dL (H)).   Assessment/Plan: S/P Procedure(s) (LRB): CORONARY ARTERY BYPASS GRAFTING (CABG)X 3 Using Left Internal Mammary Artery and Right Saphenous Vein Grafts (N/A) TRANSESOPHAGEAL ECHOCARDIOGRAM (TEE) (N/A) Mobilize Diuresis Stage I acute kidney injury creatinine now 2.0, 0.8 preop, 1.4 yesterday Developed atrial fibrillation during the night, started on IV amnio, with a bolus currently heart rate 1 10-115 Liver enzymes normal Patient neurologically intact without evidence of alcohol withdrawal- patient notes he was taking ativan at home but not on list - will resume prn Work on pulmonary toilet  Grace Isaac 03/12/2019 9:44 AM

## 2019-03-12 NOTE — Progress Notes (Signed)
Patient ID: Martin [name], male   DOB: [birth year], 63 y.o.   MRN: [id number] EVENING ROUNDS NOTE :     [address]       [place] [address]             [phone]                 2 Days Post-Op Procedure(s) (LRB): CORONARY ARTERY BYPASS GRAFTING (CABG)X 3 Using Left Internal Mammary Artery and Right Saphenous Vein Grafts (N/A) TRANSESOPHAGEAL ECHOCARDIOGRAM (TEE) (N/A)  Total Length of Stay:  LOS: 5 days  BP (!) 118/101 (BP Location: Left Arm)   Pulse (!) 127   Temp 98.6 F (37 C) (Oral)   Resp 18   Ht 5\' 8"  (1.727 m)   Wt 122.3 kg   SpO2 (!) 86%   BMI 41.00 kg/m   .Intake/Output      10/04 0701 - 10/05 0700   P.O. 120   I.V. (mL/kg) 34.7 (0.3)   Total Intake(mL/kg) 154.7 (1.3)   Urine (mL/kg/hr) 125 (0.1)   Chest Tube 0   Total Output 125   Net +29.7         . sodium chloride Stopped (03/11/19 1110)  . sodium chloride    . sodium chloride 10 mL/hr at 03/10/19 1428  . amiodarone 30 mg/hr (03/12/19 1855)  . dexmedetomidine (PRECEDEX) IV infusion Stopped (03/10/19 1630)  . lactated ringers    . lactated ringers    . lactated ringers Stopped (03/11/19 1648)  . milrinone 0.25 mcg/kg/min (03/12/19 1854)  . nitroGLYCERIN Stopped (03/10/19 1607)  . phenylephrine (NEO-SYNEPHRINE) Adult infusion Stopped (03/11/19 0914)     Lab Results  Component Value Date   WBC 15.3 (H) 03/12/2019   HGB 12.1 (L) 03/12/2019   HCT 35.9 (L) 03/12/2019   PLT 122 (L) 03/12/2019   GLUCOSE 181 (H) 03/12/2019   CHOL 170 03/07/2019   TRIG 218 (H) 03/07/2019   HDL 41 03/07/2019   LDLCALC 85 03/07/2019   ALT 26 03/12/2019   AST 35 03/12/2019   NA 132 (L) 03/12/2019   K 4.0 03/12/2019   CL 99 03/12/2019   CREATININE 2.00 (H) 03/12/2019   BUN 27 (H) 03/12/2019   CO2 24 03/12/2019   TSH 10.690 (H) 03/12/2019   INR 1.4 (H) 03/10/2019   HGBA1C 5.6 03/09/2019   Feels better Still a fib 100-110    Grace Isaac MD  [facility] [phone] Office [phone]  03/12/2019 7:25 PM

## 2019-03-12 NOTE — Progress Notes (Signed)
RN placed patient on CPAP 

## 2019-03-12 NOTE — Progress Notes (Signed)
  Amiodarone Drug - Drug Interaction Consult Note  Recommendations: No changes in medication therapy at this time   Amiodarone is metabolized by the cytochrome P450 system and therefore has the potential to cause many drug interactions. Amiodarone has an average plasma half-life of 50 days (range 20 to 100 days).   There is potential for drug interactions to occur several weeks or months after stopping treatment and the onset of drug interactions may be slow after initiating amiodarone.   [x]  Statins: Increased risk of myopathy. Simvastatin- restrict dose to 20mg  daily. Other statins: counsel patients to report any muscle pain or weakness immediately. - Atorvastatin 80mg  daily  []  Anticoagulants: Amiodarone can increase anticoagulant effect. Consider warfarin dose reduction. Patients should be monitored closely and the dose of anticoagulant altered accordingly, remembering that amiodarone levels take several weeks to stabilize.  []  Antiepileptics: Amiodarone can increase plasma concentration of phenytoin, the dose should be reduced. Note that small changes in phenytoin dose can result in large changes in levels. Monitor patient and counsel on signs of toxicity.  [x]  Beta blockers: increased risk of bradycardia, AV block and myocardial depression. Sotalol - avoid concomitant use.  Metoprolol 12.5mg  BID HR 100s  []   Calcium channel blockers (diltiazem and verapamil): increased risk of bradycardia, AV block and myocardial depression.  []   Cyclosporine: Amiodarone increases levels of cyclosporine. Reduced dose of cyclosporine is recommended.  []  Digoxin dose should be halved when amiodarone is started.  [x]  Diuretics: increased risk of cardiotoxicity if hypokalemia occurs. - PRN  furosemide K 4  []  Oral hypoglycemic agents (glyburide, glipizide, glimepiride): increased risk of hypoglycemia. Patient's glucose levels should be monitored closely when initiating amiodarone therapy.   []  Drugs  that prolong the QT interval:  Torsades de pointes risk may be increased with concurrent use - avoid if possible.  Monitor QTc, also keep magnesium/potassium WNL if concurrent therapy can't be avoided. Marland Kitchen Antibiotics: e.g. fluoroquinolones, erythromycin. . Antiarrhythmics: e.g. quinidine, procainamide, disopyramide, sotalol. . Antipsychotics: e.g. phenothiazines, haloperidol.  . Lithium, tricyclic antidepressants, and methadone.   Bonnita Nasuti Pharm.D. CPP, BCPS Clinical Pharmacist [phone] 03/12/2019 10:14 AM

## 2019-03-13 ENCOUNTER — Inpatient Hospital Stay (HOSPITAL_COMMUNITY): Payer: Medicare Other

## 2019-03-13 ENCOUNTER — Encounter (HOSPITAL_COMMUNITY): Payer: Self-pay | Admitting: Cardiothoracic Surgery

## 2019-03-13 DIAGNOSIS — I200 Unstable angina: Secondary | ICD-10-CM

## 2019-03-13 DIAGNOSIS — I6340 Cerebral infarction due to embolism of unspecified cerebral artery: Secondary | ICD-10-CM | POA: Insufficient documentation

## 2019-03-13 DIAGNOSIS — N179 Acute kidney failure, unspecified: Secondary | ICD-10-CM

## 2019-03-13 DIAGNOSIS — I4891 Unspecified atrial fibrillation: Secondary | ICD-10-CM

## 2019-03-13 DIAGNOSIS — I2510 Atherosclerotic heart disease of native coronary artery without angina pectoris: Secondary | ICD-10-CM | POA: Diagnosis not present

## 2019-03-13 DIAGNOSIS — I10 Essential (primary) hypertension: Secondary | ICD-10-CM | POA: Diagnosis not present

## 2019-03-13 DIAGNOSIS — I952 Hypotension due to drugs: Secondary | ICD-10-CM

## 2019-03-13 DIAGNOSIS — M1990 Unspecified osteoarthritis, unspecified site: Secondary | ICD-10-CM | POA: Diagnosis not present

## 2019-03-13 DIAGNOSIS — I63421 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: Secondary | ICD-10-CM

## 2019-03-13 LAB — GLUCOSE, CAPILLARY
Glucose-Capillary: 107 mg/dL — ABNORMAL HIGH (ref 70–99)
Glucose-Capillary: 113 mg/dL — ABNORMAL HIGH (ref 70–99)
Glucose-Capillary: 120 mg/dL — ABNORMAL HIGH (ref 70–99)
Glucose-Capillary: 132 mg/dL — ABNORMAL HIGH (ref 70–99)
Glucose-Capillary: 142 mg/dL — ABNORMAL HIGH (ref 70–99)
Glucose-Capillary: 144 mg/dL — ABNORMAL HIGH (ref 70–99)
Glucose-Capillary: 182 mg/dL — ABNORMAL HIGH (ref 70–99)

## 2019-03-13 LAB — COMPREHENSIVE METABOLIC PANEL
ALT: 22 U/L (ref 0–44)
AST: 26 U/L (ref 15–41)
Albumin: 2.7 g/dL — ABNORMAL LOW (ref 3.5–5.0)
Alkaline Phosphatase: 53 U/L (ref 38–126)
Anion gap: 10 (ref 5–15)
BUN: 36 mg/dL — ABNORMAL HIGH (ref 8–23)
CO2: 25 mmol/L (ref 22–32)
Calcium: 8.3 mg/dL — ABNORMAL LOW (ref 8.9–10.3)
Chloride: 94 mmol/L — ABNORMAL LOW (ref 98–111)
Creatinine, Ser: 2.21 mg/dL — ABNORMAL HIGH (ref 0.61–1.24)
GFR calc Af Amer: 35 mL/min — ABNORMAL LOW (ref >60)
GFR calc non Af Amer: 30 mL/min — ABNORMAL LOW (ref >60)
Glucose, Bld: 134 mg/dL — ABNORMAL HIGH (ref 70–99)
Potassium: 3.8 mmol/L (ref 3.5–5.1)
Sodium: 129 mmol/L — ABNORMAL LOW (ref 135–145)
Total Bilirubin: 0.8 mg/dL (ref 0.3–1.2)
Total Protein: 5.4 g/dL — ABNORMAL LOW (ref 6.5–8.1)

## 2019-03-13 LAB — COOXEMETRY PANEL
Carboxyhemoglobin: 1 % (ref 0.5–1.5)
Methemoglobin: 0.7 % (ref 0.0–1.5)
O2 Saturation: 68.4 %
Total hemoglobin: 11.4 g/dL — ABNORMAL LOW (ref 12.0–16.0)

## 2019-03-13 LAB — CBC
HCT: 32.3 % — ABNORMAL LOW (ref 39.0–52.0)
Hemoglobin: 11 g/dL — ABNORMAL LOW (ref 13.0–17.0)
MCH: 33.6 pg (ref 26.0–34.0)
MCHC: 34.1 g/dL (ref 30.0–36.0)
MCV: 98.8 fL (ref 80.0–100.0)
Platelets: 127 10*3/uL — ABNORMAL LOW (ref 150–400)
RBC: 3.27 MIL/uL — ABNORMAL LOW (ref 4.22–5.81)
RDW: 13.2 % (ref 11.5–15.5)
WBC: 12.8 10*3/uL — ABNORMAL HIGH (ref 4.0–10.5)
nRBC: 0 % (ref 0.0–0.2)

## 2019-03-13 LAB — TYPE AND SCREEN
ABO/RH(D): O POS
Antibody Screen: NEGATIVE
Unit division: 0
Unit division: 0

## 2019-03-13 LAB — BPAM RBC
Blood Product Expiration Date: 202011042359
Blood Product Expiration Date: 202011042359
Unit Type and Rh: 5100
Unit Type and Rh: 5100

## 2019-03-13 MED ORDER — GUAIFENESIN ER 600 MG PO TB12
600.0000 mg | ORAL_TABLET | Freq: Two times a day (BID) | ORAL | Status: DC
  Administered 2019-03-13 – 2019-03-27 (×28): 600 mg via ORAL
  Filled 2019-03-13 (×28): qty 1

## 2019-03-13 MED ORDER — IOHEXOL 350 MG/ML SOLN: 50.0000 mL | Freq: Once | INTRAVENOUS | Status: DC | PRN

## 2019-03-13 MED ORDER — MORPHINE SULFATE (PF) 2 MG/ML IV SOLN
1.0000 mg | INTRAVENOUS | Status: DC | PRN
  Administered 2019-03-13: 20:00:00 2 mg via INTRAVENOUS
  Administered 2019-03-15 (×2): 4 mg via INTRAVENOUS
  Filled 2019-03-13: qty 2
  Filled 2019-03-13: qty 1
  Filled 2019-03-13: qty 2

## 2019-03-13 MED ORDER — IOHEXOL 350 MG/ML SOLN
75.0000 mL | Freq: Once | INTRAVENOUS | Status: AC | PRN
  Administered 2019-03-13: 07:00:00 75 mL via INTRAVENOUS

## 2019-03-13 MED ORDER — PHENYLEPHRINE HCL-NACL 10-0.9 MG/250ML-% IV SOLN
0.0000 ug/min | INTRAVENOUS | Status: DC
  Administered 2019-03-13 (×2): 60 ug/min via INTRAVENOUS
  Administered 2019-03-13: 20 ug/min via INTRAVENOUS
  Administered 2019-03-14: 02:00:00 5 ug/min via INTRAVENOUS
  Filled 2019-03-13 (×4): qty 250

## 2019-03-13 MED ORDER — ENOXAPARIN SODIUM 40 MG/0.4ML ~~LOC~~ SOLN
40.0000 mg | SUBCUTANEOUS | Status: DC
  Administered 2019-03-13: 17:00:00 40 mg via SUBCUTANEOUS

## 2019-03-13 MED ORDER — MOMETASONE FURO-FORMOTEROL FUM 200-5 MCG/ACT IN AERO
2.0000 | INHALATION_SPRAY | Freq: Two times a day (BID) | RESPIRATORY_TRACT | Status: DC
  Administered 2019-03-13 – 2019-03-27 (×27): 2 via RESPIRATORY_TRACT
  Filled 2019-03-13 (×2): qty 8.8

## 2019-03-13 MED ORDER — DILTIAZEM HCL 100 MG IV SOLR
5.0000 mg/h | INTRAVENOUS | Status: DC
  Administered 2019-03-13: 22:00:00 7.5 mg/h via INTRAVENOUS
  Administered 2019-03-13: 09:00:00 5 mg/h via INTRAVENOUS
  Filled 2019-03-13 (×3): qty 100

## 2019-03-13 MED ORDER — FUROSEMIDE 10 MG/ML IJ SOLN
80.0000 mg | Freq: Every day | INTRAMUSCULAR | Status: DC
  Administered 2019-03-13 – 2019-03-14 (×2): 80 mg via INTRAVENOUS
  Filled 2019-03-13 (×2): qty 8

## 2019-03-13 MED ORDER — ENOXAPARIN SODIUM 40 MG/0.4ML ~~LOC~~ SOLN: 40.0000 mg | Freq: Two times a day (BID) | SUBCUTANEOUS | Status: DC

## 2019-03-13 MED ORDER — MIDAZOLAM HCL 2 MG/2ML IJ SOLN
1.0000 mg | Freq: Four times a day (QID) | INTRAMUSCULAR | Status: DC | PRN
  Administered 2019-03-15: 1 mg via INTRAVENOUS
  Filled 2019-03-13: qty 2

## 2019-03-13 MED ORDER — INSULIN ASPART 100 UNIT/ML ~~LOC~~ SOLN
0.0000 [IU] | Freq: Three times a day (TID) | SUBCUTANEOUS | Status: DC
  Administered 2019-03-13 – 2019-03-14 (×5): 2 [IU] via SUBCUTANEOUS
  Administered 2019-03-14 – 2019-03-15 (×4): 4 [IU] via SUBCUTANEOUS
  Administered 2019-03-15: 2 [IU] via SUBCUTANEOUS
  Administered 2019-03-16: 4 [IU] via SUBCUTANEOUS
  Administered 2019-03-16 – 2019-03-17 (×3): 2 [IU] via SUBCUTANEOUS
  Administered 2019-03-17: 12:00:00 8 [IU] via SUBCUTANEOUS
  Administered 2019-03-18 – 2019-03-20 (×4): 2 [IU] via SUBCUTANEOUS
  Administered 2019-03-20: 12:00:00 4 [IU] via SUBCUTANEOUS
  Administered 2019-03-21 (×2): 2 [IU] via SUBCUTANEOUS
  Administered 2019-03-22: 4 [IU] via SUBCUTANEOUS
  Administered 2019-03-23: 2 [IU] via SUBCUTANEOUS
  Administered 2019-03-23: 4 [IU] via SUBCUTANEOUS
  Administered 2019-03-23: 22:00:00 2 [IU] via SUBCUTANEOUS
  Administered 2019-03-24 (×2): 1 [IU] via SUBCUTANEOUS
  Administered 2019-03-24: 2 [IU] via SUBCUTANEOUS
  Administered 2019-03-25 (×2): 4 [IU] via SUBCUTANEOUS

## 2019-03-13 NOTE — Progress Notes (Signed)
Anesthesiology Follow-up:  63 year old male with OSA, paroxysmal Afib, and severe 3V CAD with preserved LV function. Underwent CABG X 3 on 03/10/19. Case preceded uneventfully. Post-operatively he was extubated 4 1/2 hours after surgery. He subsequently went into Afib and yesterday evening he developed L. Arm and leg weakness. Head CT today shows small R. Frontal infarct as chronic microvascular ischemic changes.  In addition creatinine has increased from baseline 0.80 to 2.12 today.  Patient awake and alert taking PO well with L. sided weakness.  VS: T- 37 BP- 107/72 HR- 98 (afib) RR- 13 O2 Sat 95% on RA  BUN/Cr.- 36/2.12 K- 3.8 glucose- 114 H/H- 11.0/32 Platelets- 32,12  63 year old male 3 days S/P CABG X3 with post-op Afib, moderate increase in creatinine, and small acute R. frontal infarct.   Roberts Gaudy

## 2019-03-13 NOTE — Discharge Summary (Addendum)
Physician Discharge Summary       [address]       ,[place] [id number]             [phone]    Patient ID: Martin [name] MRN: [id number] DOB/AGE: [birth year] 63 y.o.  Admit date: 03/07/2019 Discharge date: 03/28/2019  Admission Diagnoses: 1. Unstable angina ([place]) 2. S/p NSTEMI (non-ST elevated myocardial infarction) ([place]) 3. CAD (coronary artery disease)  Discharge Diagnoses:  1. S/P CABG x 3 2. Cerebral embolism with cerebral infarction 3. History of Essential hypertension 4. History of hyperlipidemia 5. History of PVC's (premature ventricular contractions) 6. History of Polysubstance abuse ([place]) 7. History of OSA on CPAP 8. History of Atrial fibrillation ([place]) 9. History of Habitual alcohol use 10. History of osteoarthritis    Consults: cardiology and neurology  Procedure (s):  1.  Coronary artery bypass grafting x3 (left internal mammary artery to left anterior descending, saphenous vein graft to ramus intermedius, saphenous vein graft to posterior descending). 2.  Endoscopic harvest of right leg greater saphenous vein by Dr. Prescott Gum on 03/10/2019.  History of Presenting Illness: This is a 63 year old obese nondiabetic male with history of previous PCI of the RCA and LAD approximately 10 years ago presents with unstable angina and mildly elevated troponin consistent with non-STEMI.  Currently his chest pain is controlled with IV nitroglycerin.  He underwent urgent cardiac catheterization via right radial artery by Dr. Tamala Julian which shows severe three-vessel coronary disease-chronic occlusion of the RCA with collateralization of the posterior descending, 90% ostial stenosis of a large ramus intermediate, 80% stenosis of proximal LAD with a graftable distal vessel.  The circumflex is small nondominant and with a 90% proximal stenosis.  LV function appears to be fairly well-preserved on echocardiogram with some inferior hypokinesia. There is mild aortic  stenosis of a tricuspid valve.  No significant MR, TR.  The patient was diagnosed with atrial fibrillation and started on Eliquis in May 2019 but states he has been in sinus rhythm for the past year.  No significant complications from Eliquis.  Last dose of Eliquis was 36 hours ago.  Brief Hospital Course:  The patient was extubated the evening of surgery without difficulty. He remained afebrile and hemodynamically stable. [name], a line, chest tubes, and foley were removed early in the post operative course. Lopressor was started and titrated accordingly. He went into a fib with RVR 10/03. He was initially put on Amiodarone followed by Cardizem for better rate control. Anticoagulation was initially held secondary to CVA on 10/05;however, he was put on IV heparin later and then transitioned to Apixaban (as was ok with neurology). Repeat CT of the head showed ACA thrombus. He remained on Milrinone drip and co ox was monitored. This was stopped on 10/07. He was volume over loaded and diuresed. He had ABL anemia.  He has a history of OSA and iswas on CPAP at night. He was weaned off the insulin drip.   The patient's glucose remained well controlled.The patient's HGA1C pre op was 5.6 . The evening of 10/04, the nurse noted patient was dragging his leg on his way from the bathroom. He subsequently found weaker on the left side the morning of 10/05. A neurology consult was obtained. CT showed right ACA stroke resulting in left hemiparesis. Speech pathology consult was obtained and recommendations were followed accordingly. He was hypertensive on 10/07 so was put back on Cardizem orally. He had abdominal discomfort, nausea, and constipation on 10/08.  He was given a few doses of Reglan and laxatives. He had a bowel movement and symptoms resolved. He remained in a fib but rate was better controlled as of 10/09. He was on oral Amiodarone, Diltiazem, Lopressor, and Apixaban.  He converted to NSR.  However he  progressed to episodes of V. Tachycardia followed by bradycardia.  He had a vagal episode and went briefly Asystolic.  The patient was evaluated by EP and all medications have been stopped, accept Amiodarone which was restarted on 03/26/2019.  He remains on Eliquis.  He developed urinary retention post foley removal.  He required several in and out catheterizations.  Ultimately he was started on flomax and his foley catheter was re-inserted.  He will be discharged with a foley catheter in place with plans for removal in future once bladder training is complete.  He developed a leukocytosis and mild fever.  Workup showed no source of infection.  He was treated empirically with antibiotics which completed today 10/19.  He continues to participate with PT/OT.  He is highly motivated to get recovered.  It is recommended he be discharged to inpatient rehabilitation.  He is medically stable for discharge today.   Latest Vital Signs: Blood pressure 129/79, pulse 93, temperature 99.1 F (37.3 C), temperature source Axillary, resp. rate 17, height 5\' 8"  (1.727 m), weight 123.2 kg, SpO2 97 %.  Physical Exam:General appearance: alert, cooperative and no distress Neurologic: left sided weakness and no new changes Heart: regular rate and rhythm Lungs: dim in bases Abdomen: benign Extremities: min edema Wound: incis without signs of infection  Discharge Condition: Stable and discharged to CIR.  Recent laboratory studies:  Lab Results  Component Value Date   WBC 6.4 03/28/2019   HGB 10.3 (L) 03/28/2019   HCT 31.4 (L) 03/28/2019   MCV 100.6 (H) 03/28/2019   PLT 241 03/28/2019   Lab Results  Component Value Date   NA 136 03/28/2019   K 3.8 03/28/2019   CL 100 03/28/2019   CO2 27 03/28/2019   CREATININE 0.58 (L) 03/28/2019   GLUCOSE 110 (H) 03/28/2019     Diagnostic Studies: Ct Angio Head W Or Wo Contrast  Result Date: 03/14/2019 CLINICAL DATA:  Anterior cerebral artery territory infarct  follow-up EXAM: CT ANGIOGRAPHY HEAD AND NECK TECHNIQUE: Multidetector CT imaging of the head and neck was performed using the standard protocol during bolus administration of intravenous contrast. Multiplanar CT image reconstructions and MIPs were obtained to evaluate the vascular anatomy. Carotid stenosis measurements (when applicable) are obtained utilizing NASCET criteria, using the distal internal carotid diameter as the denominator. CONTRAST:  11mL OMNIPAQUE IOHEXOL 350 MG/ML SOLN COMPARISON:  CTA head neck 10/23/2017 Head CT 03/13/2019 FINDINGS: CTA NECK FINDINGS SKELETON: There is no bony spinal canal stenosis. No lytic or blastic lesion. OTHER NECK: Status post recent coronary artery bypass graft with median sternotomy. There is a retrosternal fluid collection that measures 4.3 x 2.0 x 5.2 cm. UPPER CHEST: Bilateral pleural effusions. There is a large loculated component of the left effusion. AORTIC ARCH: There is moderate calcific atherosclerosis of the aortic arch. There is no aneurysm, dissection or hemodynamically significant stenosis of the visualized portion of the aorta. Normal variant aortic arch branching pattern with the brachiocephalic and left common carotid arteries sharing a common origin. The visualized proximal subclavian arteries are widely patent. RIGHT CAROTID SYSTEM: There is mild narrowing of the proximal common carotid artery due to noncalcified plaque. There is circumferential atherosclerotic calcification at the carotid bifurcation and  proximal internal carotid artery resulting in less than 50% stenosis. The distal internal carotid artery is widely patent. Proximal external carotid artery is normal. LEFT CAROTID SYSTEM: Mild common carotid atherosclerosis. Circumferential calcification of the proximal ICA without hemodynamically significant stenosis. Normal distal ICA aside from minor calcification. There is mild narrowing of the origin of the external carotid artery due to  noncalcified plaque. VERTEBRAL ARTERIES: Right dominant configuration. Poor visualization of the right origin. Left origin is patent. There is no dissection, occlusion or flow-limiting stenosis to the skull base (V1-V3 segments). CTA HEAD FINDINGS POSTERIOR CIRCULATION: --Vertebral arteries: Normal right V4 segment. There is loss of opacification of the left V4 segment distal to the PICA origin. --Posterior inferior cerebellar arteries (PICA): Patent origins from the vertebral arteries. --Anterior inferior cerebellar arteries (AICA): Right dominant --Basilar artery: Diminutive --Superior cerebellar arteries: Normal. --Posterior cerebral arteries: Normal. Both are predominantly supplied by the posterior communicating arteries (p-comm). ANTERIOR CIRCULATION: --Intracranial internal carotid arteries: Atherosclerotic calcification of the internal carotid arteries at the skull base without hemodynamically significant stenosis. --Anterior cerebral arteries (ACA): There is a new focus of calcification in the distal right A2 segment (series 12, image 22) that may indicate partially calcified thrombus. However, the remainder of the vessel is normally opacified. Calcification in the left A2 segment is unchanged. Both A1 segments are present. Patent anterior communicating artery (a-comm). --Middle cerebral arteries (MCA): Normal. VENOUS SINUSES: As permitted by contrast timing, patent. ANATOMIC VARIANTS: Bilateral fetal origins of the posterior cerebral arteries. Review of the MIP images confirms the above findings. IMPRESSION: 1. No emergent large vessel occlusion or hemodynamically significant stenosis. 2. New focus of calcification in the right anterior cerebral artery distal A2 segment (s12:i22), which may indicate partially calcified thrombus. The vessel is patent distal to this, however. 3. Decreased opacification of the distal V4 segment of the left vertebral artery, distal to the PICA origin, age-indeterminate.  Bilateral fetal origins of the posterior cerebral arteries with diminutive vertebrobasilar system. 4. Status post recent coronary artery bypass graft with retrosternal fluid collection that measures 4.3 x 2.0 x 5.2 cm. In the immediate postoperative setting, this is favored to be a seroma. 5. Bilateral pleural effusions with large loculated component of the left effusion. 6. Aortic Atherosclerosis (ICD10-I70.0). Electronically Signed   By: Ulyses Jarred M.D.   On: 03/14/2019 19:36   Dg Chest 1 View  Result Date: 03/10/2019 CLINICAL DATA:  Status post CABG, rule out pneumo. EXAM: CHEST  1 VIEW COMPARISON:  03/07/2019 FINDINGS: Now status post median sternotomy and CABG. Swan-Ganz catheter in proximal right pulmonary artery. A transesophageal echocardiographic pro passes to the level of the T10 vertebral body. Seven intact sternal wires are noted. Left-sided chest tube in place without signs of pneumothorax on supine radiograph. Cardiomediastinal contours with mild widening superiorly likely secondary to postoperative change otherwise unchanged accounting for low lung volumes. There is a single tube in the midline projecting over sternal wires extending to the level of the sternal manubrium. This may represent mediastinal drain. IMPRESSION: 1. Status post CABG with left chest tube without visible pneumothorax. 2. Swan-Ganz catheter and transesophageal echocardiographic probe in place. 3. Midline drainage tubing projects over the mediastinum, potentially mediastinal drain, clinical correlation suggested. Electronically Signed   By: Zetta Bills M.D.   On: 03/10/2019 13:30   Dg Chest 2 View  Result Date: 03/20/2019 CLINICAL DATA:  Post CABG EXAM: CHEST - 2 VIEW COMPARISON:  Radiograph 03/18/2019 FINDINGS: Right PICC terminates at the superior cavoatrial junction. Postsurgical changes  related to prior CABG including intact and aligned sternotomy wires and multiple surgical clips projecting over the  mediastinum. Stable postoperative mediastinal widening. Bandlike areas of opacity likely reflect atelectatic change. Suspect at least small bilateral effusions. Nodular density in the left mid lung may reflect a hematoma at the site of prior chest tube placement. No acute osseous or soft tissue abnormality. IMPRESSION: Stable appearance of bilateral effusions and basilar atelectasis. Stable nodular density in the left mid lung, possibly hematoma at site of prior chest tube. Electronically Signed   By: Lovena Le M.D.   On: 03/20/2019 05:59   Dg Chest 2 View  Result Date: 03/07/2019 CLINICAL DATA:  Chest pain for 2 weeks. EXAM: CHEST - 2 VIEW COMPARISON:  10/28/2017 and prior radiographs FINDINGS: Cardiomegaly and LEFT basilar scarring again noted. There is no evidence of focal airspace disease, pulmonary edema, suspicious pulmonary nodule/mass, pleural effusion, or pneumothorax. No acute bony abnormalities are identified. IMPRESSION: Cardiomegaly without evidence of acute cardiopulmonary disease. Electronically Signed   By: Margarette Canada M.D.   On: 03/07/2019 14:30   Ct Head Wo Contrast  Result Date: 03/16/2019 CLINICAL DATA:  63 year old male with altered mental status. Right ACA territory infarct. Episode of unresponsiveness. EXAM: CT HEAD WITHOUT CONTRAST TECHNIQUE: Contiguous axial images were obtained from the base of the skull through the vertex without intravenous contrast. COMPARISON:  CT head and CTA head and neck 03/14/2019. FINDINGS: Brain: Cytotoxic edema in the distal right ACA territory on series 3, image 23 has not significantly changed. No associated hemorrhage or mass effect. There is additional patchy hypodensity in the right MCA/ACA watershed white matter on series 3, image 25 which is stable. Lesser left hemisphere white matter hypodensity. No midline shift, mass effect, or evidence of intracranial mass lesion. No ventriculomegaly. No new cortically based infarct. Vascular: Calcified  atherosclerosis at the skull base. No suspicious intracranial vascular hyperdensity. a Skull: No acute osseous abnormality identified. Sinuses/Orbits: Mildly increased paranasal sinus mucosal thickening and opacity. Tympanic cavities and mastoids remain clear. Other: No acute orbit or scalp soft tissue findings. IMPRESSION: 1. Stable CT appearance of the distal right ACA and right ACA/MCA watershed ischemia. No associated hemorrhage or mass effect. Underlying chronic white matter disease. 2. No new intracranial abnormality. Electronically Signed   By: Genevie Ann M.D.   On: 03/16/2019 13:38   Ct Head Wo Contrast  Result Date: 03/14/2019 CLINICAL DATA:  Stroke, follow-up. EXAM: CT HEAD WITHOUT CONTRAST TECHNIQUE: Contiguous axial images were obtained from the base of the skull through the vertex without intravenous contrast. COMPARISON:  CT head without contrast 03/13/2019 FINDINGS: Brain: Progressive hypoattenuation is noted within the right cingulate gyrus. Subjacent white matter hypoattenuation is progressed as well. No acute hemorrhage is noted. Precentral gyrus is intact. Periventricular white matter changes are otherwise stable. No significant extraaxial fluid collection is present. The brainstem and cerebellum are within normal limits. Vascular: Atherosclerotic changes are noted within the cavernous internal carotid arteries. There is no hyperdense vessel. Skull: Calvarium is intact. No focal lytic or blastic lesions are present. Sinuses/Orbits: Mild mucosal thickening is present within ethmoid air cells. No fluid levels are present. The globes and orbits are within normal limits. IMPRESSION: 1. Progressive right ACA territory nonhemorrhagic infarct. 2. Progressive adjacent white matter infarct. 3. Atherosclerosis. 4. Diffuse small vessel disease. Electronically Signed   By: San Morelle M.D.   On: 03/14/2019 12:12   Ct Head Wo Contrast  Result Date: 03/13/2019 CLINICAL DATA:  Generalized muscle  weakness. Left-sided  weakness EXAM: CT HEAD WITHOUT CONTRAST TECHNIQUE: Contiguous axial images were obtained from the base of the skull through the vertex without intravenous contrast. COMPARISON:  Brain MRI 04/18/2018 FINDINGS: Brain: Small area of cytotoxic appearance in the parasagittal and posterior right frontal region, distal ACA territory. No hemorrhage, hydrocephalus, or masslike finding. Low-density in the cerebral white matter that is fairly symmetric and likely chronic microvascular ischemia but progressed from MR. Small cortically based calcification along the right hemisphere, nonspecific in isolation. Vascular: Atherosclerotic calcification. Skull: Negative. Sinuses/Orbits: Negative. Other: These results were communicated to Dr. Lorraine Lax at 6:52 amon 10/5/2020by text page via the [facility] messaging system. IMPRESSION: 1. Small acute cortical infarct in the distal right ACA territory. 2. Cerebral white matter low-density that is mild and symmetric but may have progressed from 2019, question right watershed white matter infarcts as well. Electronically Signed   By: Monte Fantasia M.D.   On: 03/13/2019 06:58   Ct Angio Neck W Or Wo Contrast  Result Date: 03/14/2019 CLINICAL DATA:  Anterior cerebral artery territory infarct follow-up EXAM: CT ANGIOGRAPHY HEAD AND NECK TECHNIQUE: Multidetector CT imaging of the head and neck was performed using the standard protocol during bolus administration of intravenous contrast. Multiplanar CT image reconstructions and MIPs were obtained to evaluate the vascular anatomy. Carotid stenosis measurements (when applicable) are obtained utilizing NASCET criteria, using the distal internal carotid diameter as the denominator. CONTRAST:  49mL OMNIPAQUE IOHEXOL 350 MG/ML SOLN COMPARISON:  CTA head neck 10/23/2017 Head CT 03/13/2019 FINDINGS: CTA NECK FINDINGS SKELETON: There is no bony spinal canal stenosis. No lytic or blastic lesion. OTHER NECK: Status post recent coronary  artery bypass graft with median sternotomy. There is a retrosternal fluid collection that measures 4.3 x 2.0 x 5.2 cm. UPPER CHEST: Bilateral pleural effusions. There is a large loculated component of the left effusion. AORTIC ARCH: There is moderate calcific atherosclerosis of the aortic arch. There is no aneurysm, dissection or hemodynamically significant stenosis of the visualized portion of the aorta. Normal variant aortic arch branching pattern with the brachiocephalic and left common carotid arteries sharing a common origin. The visualized proximal subclavian arteries are widely patent. RIGHT CAROTID SYSTEM: There is mild narrowing of the proximal common carotid artery due to noncalcified plaque. There is circumferential atherosclerotic calcification at the carotid bifurcation and proximal internal carotid artery resulting in less than 50% stenosis. The distal internal carotid artery is widely patent. Proximal external carotid artery is normal. LEFT CAROTID SYSTEM: Mild common carotid atherosclerosis. Circumferential calcification of the proximal ICA without hemodynamically significant stenosis. Normal distal ICA aside from minor calcification. There is mild narrowing of the origin of the external carotid artery due to noncalcified plaque. VERTEBRAL ARTERIES: Right dominant configuration. Poor visualization of the right origin. Left origin is patent. There is no dissection, occlusion or flow-limiting stenosis to the skull base (V1-V3 segments). CTA HEAD FINDINGS POSTERIOR CIRCULATION: --Vertebral arteries: Normal right V4 segment. There is loss of opacification of the left V4 segment distal to the PICA origin. --Posterior inferior cerebellar arteries (PICA): Patent origins from the vertebral arteries. --Anterior inferior cerebellar arteries (AICA): Right dominant --Basilar artery: Diminutive --Superior cerebellar arteries: Normal. --Posterior cerebral arteries: Normal. Both are predominantly supplied by the  posterior communicating arteries (p-comm). ANTERIOR CIRCULATION: --Intracranial internal carotid arteries: Atherosclerotic calcification of the internal carotid arteries at the skull base without hemodynamically significant stenosis. --Anterior cerebral arteries (ACA): There is a new focus of calcification in the distal right A2 segment (series 12, image 22) that may indicate  partially calcified thrombus. However, the remainder of the vessel is normally opacified. Calcification in the left A2 segment is unchanged. Both A1 segments are present. Patent anterior communicating artery (a-comm). --Middle cerebral arteries (MCA): Normal. VENOUS SINUSES: As permitted by contrast timing, patent. ANATOMIC VARIANTS: Bilateral fetal origins of the posterior cerebral arteries. Review of the MIP images confirms the above findings. IMPRESSION: 1. No emergent large vessel occlusion or hemodynamically significant stenosis. 2. New focus of calcification in the right anterior cerebral artery distal A2 segment (s12:i22), which may indicate partially calcified thrombus. The vessel is patent distal to this, however. 3. Decreased opacification of the distal V4 segment of the left vertebral artery, distal to the PICA origin, age-indeterminate. Bilateral fetal origins of the posterior cerebral arteries with diminutive vertebrobasilar system. 4. Status post recent coronary artery bypass graft with retrosternal fluid collection that measures 4.3 x 2.0 x 5.2 cm. In the immediate postoperative setting, this is favored to be a seroma. 5. Bilateral pleural effusions with large loculated component of the left effusion. 6. Aortic Atherosclerosis (ICD10-I70.0). Electronically Signed   By: Ulyses Jarred M.D.   On: 03/14/2019 19:36   Dg Chest Port 1 View  Result Date: 03/21/2019 CLINICAL DATA:  63 year old male with pleural effusion. EXAM: PORTABLE CHEST 1 VIEW COMPARISON:  Chest radiograph dated 03/20/2019 FINDINGS: Right-sided PICC in stable  position. No significant interval change in the size of the pleural effusion or associated bibasilar atelectasis/infiltrate since the prior radiograph. No pneumothorax. Stable area of nodular density the left mid lung field as seen previously. Stable cardiomegaly. Median sternotomy wires and postsurgical changes of CABG. No acute osseous pathology. IMPRESSION: 1. No significant interval change in the size of the pleural effusions and associated bibasilar atelectasis/infiltrate. 2. Stable cardiomegaly. Electronically Signed   By: Anner Crete M.D.   On: 03/21/2019 16:01   Dg Chest Port 1 View  Result Date: 03/18/2019 CLINICAL DATA:  Status post coronary bypass graft. EXAM: PORTABLE CHEST 1 VIEW COMPARISON:  March 16, 2019. FINDINGS: Stable cardiomegaly. Status post coronary bypass graft. Right-sided PICC line is unchanged in position. Stable left perihilar density is noted concerning for atelectasis. No pneumothorax is noted. Stable bilateral pleural effusions are noted with associated atelectasis. Bony thorax is unremarkable. IMPRESSION: Stable bilateral pleural effusions with associated atelectasis. Electronically Signed   By: Marijo Conception M.D.   On: 03/18/2019 09:58   Dg Chest Port 1 View  Result Date: 03/16/2019 CLINICAL DATA:  63 year old male with shortness of breath, pleural effusion. Status post CABG postoperative day 6. EXAM: PORTABLE CHEST 1 VIEW COMPARISON:  Portable chest 03/15/2019 and earlier. FINDINGS: Portable AP semi upright view at 0527 hours. Right IJ introducer sheath is removed. Right PICC line remains. Stable cardiomegaly and lung volumes. No pneumothorax or pulmonary edema. Veiling lung base opacity and dense retrocardiac opacity. Superimposed nodular probable segmental atelectasis near the left hilum. No acute osseous abnormality identified. IMPRESSION: 1. Right IJ introducer sheath removed.  Stable right PICC line. 2. Evidence of bilateral pleural effusions with retrocardiac  and left perihilar atelectasis. Electronically Signed   By: Genevie Ann M.D.   On: 03/16/2019 08:17   Dg Chest Port 1 View  Result Date: 03/15/2019 CLINICAL DATA:  Status post coronary artery bypass graft. EXAM: PORTABLE CHEST 1 VIEW COMPARISON:  Radiograph of March 14, 2019. FINDINGS: Stable cardiomegaly. Status post coronary bypass graft. Right-sided PICC line is unchanged in position. Stable bibasilar atelectasis is noted with associated pleural effusions. Stable left midlung opacity is noted most  consistent with small hematoma related to prior chest tube. Bony thorax is unremarkable. IMPRESSION: Stable bibasilar atelectasis is noted with associated pleural effusions. Stable rounded density seen in left midlung most consistent with hematoma secondary to prior chest tube. Electronically Signed   By: Marijo Conception M.D.   On: 03/15/2019 08:33   Dg Chest Port 1 View  Result Date: 03/14/2019 CLINICAL DATA:  Short of breath.  Status post CABG surgery. EXAM: PORTABLE CHEST 1 VIEW COMPARISON:  03/13/2019 and earlier exams. FINDINGS: Left chest tube has been removed. There is opacity corresponding to the chest tube tract consistent with fluid/atelectasis. No pneumothorax. Persistent opacity noted at the bases consistent with a combination of pleural effusions and atelectasis. No convincing pulmonary edema. No mediastinal widening.  Cardiac silhouette is enlarged but stable. Stable right internal jugular Cordis. IMPRESSION: 1. Status post removal of the left chest tube.  No pneumothorax. 2. Persistent lung base opacity consistent with a combination of pleural effusions and atelectasis. Electronically Signed   By: Lajean Manes M.D.   On: 03/14/2019 08:23   Dg Chest Port 1 View  Result Date: 03/13/2019 CLINICAL DATA:  Chest tube placement EXAM: PORTABLE CHEST 1 VIEW COMPARISON:  03/12/2019 FINDINGS: The left-sided chest tube is stable in positioning. The right-sided central venous catheter is stable in  positioning. The heart size remains significantly enlarged. The patient is status post prior median sternotomy. There are bilateral pleural effusions with adjacent atelectasis. There is no pneumothorax. There is generalized volume overload. IMPRESSION: 1. Lines and tubes as above. 2. No pneumothorax. 3. Cardiomegaly with persistent bilateral pleural effusions and adjacent atelectasis. Electronically Signed   By: Constance Holster M.D.   On: 03/13/2019 08:02   Dg Chest Port 1 View  Result Date: 03/12/2019 CLINICAL DATA:  Status post surgery. EXAM: PORTABLE CHEST 1 VIEW COMPARISON:  03/11/2019 FINDINGS: Interval removal of the Swan-Ganz catheter. Right jugular central venous sheath remains in place. Left-sided chest tube in unchanged position. No left pneumothorax. Mild left basilar atelectasis. Right basilar airspace disease likely reflecting atelectasis with a small right pleural effusion. No right pneumothorax. Stable cardiomediastinal silhouette. Recent CABG. IMPRESSION: 1. Left-sided chest tube in unchanged position. No left pneumothorax. Mild left basilar atelectasis. 2. Right basilar atelectasis with a small right pleural effusion. Electronically Signed   By: Kathreen Devoid   On: 03/12/2019 09:11   Dg Chest Port 1 View  Result Date: 03/11/2019 CLINICAL DATA:  CABG x3 03/10/2019. EXAM: PORTABLE CHEST 1 VIEW COMPARISON:  03/10/2019 FINDINGS: Right IJ Swan-Ganz catheter unchanged with tip over the right main pulmonary artery. Left-sided chest tube unchanged. Mediastinal drain unchanged. Lungs are adequately inflated with very minimal bibasilar density likely small amount of bilateral pleural fluid/atelectasis. Mild stable cardiomegaly. Remainder of the exam is unchanged. IMPRESSION: Suggestion of small amount of bilateral pleural fluid/atelectasis. Stable cardiomegaly. Tubes and lines as described. Electronically Signed   By: Marin Olp M.D.   On: 03/11/2019 08:46   Dg Chest Port 1 View  Result Date:  03/10/2019 CLINICAL DATA:  S/p CABG x 3, chest tube in place, pt intubated. EXAM: PORTABLE CHEST 1 VIEW COMPARISON:  Chest radiograph 03/10/2019 at 1:09 p.m. FINDINGS: Stable enlarged cardiomediastinal contours status post median sternotomy. Interval intubation with endotracheal tube tip projecting between the thoracic inlet and carina. Stable remaining support apparatus including a PA catheter, enteric tube and left chest tube. The lungs are well aerated. No new focal pulmonary opacity. No pneumothorax or large pleural effusion. IMPRESSION: 1. Interval intubation with  endotracheal tube tip projecting between the thoracic inlet and carina. 2. Postoperative appearance of the chest. Electronically Signed   By: Audie Pinto M.D.   On: 03/10/2019 15:08   Dg Chest Port 1v Same Day  Result Date: 03/14/2019 CLINICAL DATA:  Bedside PICC placement. EXAM: PORTABLE CHEST 1 VIEW 1:04 p.m.: COMPARISON:  Portable chest x-ray earlier same day at 5:29 a.m. and previously. FINDINGS: RIGHT arm PICC tip projects at or near the cavoatrial junction. RIGHT jugular introducer sheath tip projects at the junction of the jugular and innominate veins. Prior sternotomy for CABG. Cardiac silhouette markedly enlarged. Normal pulmonary vascularity. Bibasilar atelectasis, unchanged since earlier in the day. Round opacity in the LEFT UPPER LOBE at the site of the prior chest tube, unchanged, presumably a small hematoma. No new pulmonary parenchymal abnormalities. IMPRESSION: 1. RIGHT arm PICC tip projects at or near the cavoatrial junction. 2. Remaining support apparatus satisfactory. 3. Stable bibasilar atelectasis. 4. Stable rounded opacity in the LEFT upper lobe at the site of the prior chest tube, presumably a small hematoma. 5. No new abnormalities. Electronically Signed   By: Evangeline Dakin M.D.   On: 03/14/2019 13:18   Vas US Doppler Pre Cabg  Result Date: 03/09/2019 PREOPERATIVE VASCULAR EVALUATION  Indications:      Pre CABG  evaluation. Risk Factors:     Hypertension, hyperlipidemia, coronary artery disease. Other Factors:    Atrial fibrillation. Comparison Study: Prior study done 05/23/16 is available for comparison Performing Technologist: Birdena Crandall, [place] Technologist: Sharion Dove RVS  Examination Guidelines: A complete evaluation includes B-mode imaging, spectral Doppler, color Doppler, and power Doppler as needed of all accessible portions of each vessel. Bilateral testing is considered an integral part of a complete examination. Limited examinations for reoccurring indications may be performed as noted.  Right Carotid Findings: +----------+--------+--------+--------+--------+------------------+             PSV cm/s EDV cm/s Stenosis Describe Comments            +----------+--------+--------+--------+--------+------------------+  CCA Prox   46       6                 calcific                     +----------+--------+--------+--------+--------+------------------+  CCA Distal 42       7                          intimal thickening  +----------+--------+--------+--------+--------+------------------+  ICA Prox   46       14                calcific Shadowing           +----------+--------+--------+--------+--------+------------------+  ICA Distal 52       11                                             +----------+--------+--------+--------+--------+------------------+  ECA        108      11                                             +----------+--------+--------+--------+--------+------------------+ Portions of this table do not  appear on this page. +----------+--------+-------+--------+------------+             PSV cm/s EDV cms Describe Arm Pressure  +----------+--------+-------+--------+------------+  Subclavian 161                       145           +----------+--------+-------+--------+------------+ +---------+--------+--+--------+--+  Vertebral PSV cm/s 34 EDV cm/s 11  +---------+--------+--+--------+--+ Left  Carotid Findings: +----------+--------+--------+--------+------------+------------------+             PSV cm/s EDV cm/s Stenosis Describe     Comments            +----------+--------+--------+--------+------------+------------------+  CCA Prox   55       13                homogeneous                      +----------+--------+--------+--------+------------+------------------+  CCA Distal 57       12                             intimal thickening  +----------+--------+--------+--------+------------+------------------+  ICA Prox   66       22                heterogenous                     +----------+--------+--------+--------+------------+------------------+  ICA Distal 77       21                             tortuous            +----------+--------+--------+--------+------------+------------------+  ECA        184      19                                                 +----------+--------+--------+--------+------------+------------------+ +----------+--------+--------+--------+------------+  Subclavian PSV cm/s EDV cm/s Describe Arm Pressure  +----------+--------+--------+--------+------------+             220                        145           +----------+--------+--------+--------+------------+ +---------+--------+--+--------+-+  Vertebral PSV cm/s 33 EDV cm/s 7  +---------+--------+--+--------+-+  ABI Findings: +---------+------------------+-----+----------+--------+  Right     Rt Pressure (mmHg) Index Waveform   Comment   +---------+------------------+-----+----------+--------+  Brachial  145                      triphasic            +---------+------------------+-----+----------+--------+  PTA       104                0.72  monophasic           +---------+------------------+-----+----------+--------+  DP        117                0.81  monophasic           +---------+------------------+-----+----------+--------+  Great Toe 76  0.52                        +---------+------------------+-----+----------+--------+ +---------+------------------+-----+---------+-------+  Left      Lt Pressure (mmHg) Index Waveform  Comment  +---------+------------------+-----+---------+-------+  Brachial  145                      triphasic          +---------+------------------+-----+---------+-------+  PTA       186                1.28  biphasic           +---------+------------------+-----+---------+-------+  DP        175                1.21  biphasic           +---------+------------------+-----+---------+-------+  Great Toe 118                0.81                     +---------+------------------+-----+---------+-------+ +-------+---------------+----------------+  ABI/TBI Today's ABI/TBI Previous ABI/TBI  +-------+---------------+----------------+  Right   0.81/0.52       No previous       +-------+---------------+----------------+  Left    1.28/0.81       No previous       +-------+---------------+----------------+  Right Doppler Findings: +-----------+--------+-----+---------+--------+  Site        Pressure Index Doppler   Comments  +-----------+--------+-----+---------+--------+  Brachial    145            triphasic           +-----------+--------+-----+---------+--------+  Radial                     triphasic           +-----------+--------+-----+---------+--------+  Ulnar                      triphasic           +-----------+--------+-----+---------+--------+  Palmar Arch                          normal    +-----------+--------+-----+---------+--------+  Left Doppler Findings: +-----------+--------+-----+---------+--------+  Site        Pressure Index Doppler   Comments  +-----------+--------+-----+---------+--------+  Brachial    145            triphasic           +-----------+--------+-----+---------+--------+  Radial                     triphasic           +-----------+--------+-----+---------+--------+  Ulnar                      triphasic            +-----------+--------+-----+---------+--------+  Palmar Arch                          normal    +-----------+--------+-----+---------+--------+  Summary: Right Carotid: Velocities in the right ICA are consistent with a 1-39% stenosis.                No change since study done 05/23/16. Left Carotid: Velocities in the left ICA are consistent with a  1-39% stenosis.               No change since study done 05/23/16. Vertebrals:  Bilateral vertebral arteries demonstrate antegrade flow. Subclavians: Normal flow hemodynamics were seen in bilateral subclavian              arteries. Right ABI: Resting right ankle-brachial index indicates mild right lower extremity arterial disease. The right toe-brachial index is abnormal. Left ABI: Resting left ankle-brachial index is within normal range. No evidence of significant left lower extremity arterial disease. The left toe-brachial index is normal. Right Upper Extremity: Doppler waveforms remain within normal limits with right radial compression. Doppler waveforms remain within normal limits with right ulnar compression. Left Upper Extremity: Doppler waveforms remain within normal limits with left radial compression. Doppler waveforms remain within normal limits with left ulnar compression.  Electronically signed by Servando Snare MD on 03/09/2019 at 5:45:33 PM.    Final    Ct Head Code Stroke Wo Contrast  Result Date: 03/22/2019 CLINICAL DATA:  Code stroke. Altered level of consciousness (LOC), unexplained. Confusion. EXAM: CT HEAD WITHOUT CONTRAST TECHNIQUE: Contiguous axial images were obtained from the base of the skull through the vertex without intravenous contrast. COMPARISON:  Head CT 03/16/2019, CT angiogram head 03/14/2019, brain MRI 04/18/2018, CT angiogram head/neck 10/23/2017 FINDINGS: Brain: Unchanged appearance of a subacute right ACA as well as ACA/MCA watershed territory infarction. No new demarcated cortical infarction is identified. No evidence of  intracranial hemorrhage. No midline shift or extra-axial fluid collection. Stable generalized parenchymal atrophy and chronic small vessel ischemic disease. Partially empty sella turcica. Vascular: No hyperdense vessel.  Atherosclerotic calcification. Skull: No calvarial fracture or suspicious osseous lesion. Sinuses/Orbits: Minimal scattered paranasal sinus mucosal thickening at the imaged levels. Small amount of frothy secretions within a posterior left ethmoid air cell. No significant mastoid effusion. These results were communicated to Dr. Leonie Man At 5:06 pmon 10/14/2020by text page via the [facility] messaging system. IMPRESSION: 1. Unchanged appearance of subacute right ACA as well as ACA/MCA watershed territory infarcts. No new acute infarction is demonstrated. No intracranial hemorrhage. 2. Stable atrophy and chronic small vessel ischemic disease. Electronically Signed   By: Kellie Simmering   On: 03/22/2019 17:13   Vas [place] Transcranial Doppler  Result Date: 03/14/2019  Transcranial Doppler Indications: ICH. History: Post op CABG 03/10/19. Hx TIA. New onset left side weakness 03/12/19. Limitations for diagnostic windows: Unable to insonate occipital window. Comparison Study: no prior Performing Technologist: June Leap RDMS, RVT  Examination Guidelines: A complete evaluation includes B-mode imaging, spectral Doppler, color Doppler, and power Doppler as needed of all accessible portions of each vessel. Bilateral testing is considered an integral part of a complete examination. Limited examinations for reoccurring indications may be performed as noted.  +----------+-------------+----------+-----------+--------------+  RIGHT TCD  Right VM (cm) Depth (cm) Pulsatility    Comment      +----------+-------------+----------+-----------+--------------+  MCA            63.00                   1.18                     +----------+-------------+----------+-----------+--------------+  ACA           -28.00                   0.97                      +----------+-------------+----------+-----------+--------------+  Term ICA       56.00                     1                      +----------+-------------+----------+-----------+--------------+  PCA            18.00                   1.11                     +----------+-------------+----------+-----------+--------------+  Opthalmic      22.00                   1.52                     +----------+-------------+----------+-----------+--------------+  ICA siphon     38.00                   1.17                     +----------+-------------+----------+-----------+--------------+  Vertebral                                       not visualized  +----------+-------------+----------+-----------+--------------+  +----------+------------+----------+-----------+--------------+  LEFT TCD   Left VM (cm) Depth (cm) Pulsatility    Comment      +----------+------------+----------+-----------+--------------+  MCA           67.00                   0.84                     +----------+------------+----------+-----------+--------------+  ACA           -24.00                  1.06                     +----------+------------+----------+-----------+--------------+  Term ICA      60.00                   1.08                     +----------+------------+----------+-----------+--------------+  PCA           44.00                   1.02                     +----------+------------+----------+-----------+--------------+  Opthalmic     19.00                   1.66                     +----------+------------+----------+-----------+--------------+  ICA siphon    38.00                   0.95                     +----------+------------+----------+-----------+--------------+  Vertebral  not visualized  +----------+------------+----------+-----------+--------------+  +------------+-------+------------------------+               VM cm/s         Comment            +------------+-------+------------------------+  Prox Basilar -14.00  Poorly visualized window  +------------+-------+------------------------+ Summary:  Normalmean flow velocities in identified vessels of anterior cerebral cirn. Suboptimal occipital window limits evaluation of posterior circulation vessels. *See table(s) above for measurements and observations.  Diagnosing physician: Antony Contras MD Electronically signed by Antony Contras MD on 03/14/2019 at 8:07:57 AM.    Final    [place] Ekg Site Rite  Result Date: 03/14/2019 If Site Rite image not attached, placement could not be confirmed due to current cardiac rhythm.      Discharge Instructions    Amb Referral to Cardiac Rehabilitation   Complete by: As directed    Patient interested in CR after discharge from CIR (inpatient rehab)   Diagnosis: CABG   CABG X ___: 3   After initial evaluation and assessments completed: Virtual Based Care may be provided alone or in conjunction with Phase 2 Cardiac Rehab based on patient barriers.: Yes   Ambulatory referral to Neurology   Complete by: As directed    Follow up with stroke clinic NP (Jessica Vanschaick or Cecille Rubin, if both not available, consider Zachery Dauer, or [place]) at [facility] in about 4 weeks. Thanks.      Discharge Medications: Allergies as of 03/27/2019      Reactions   Penicillins Hives, Rash   Has patient had a PCN reaction causing immediate rash, facial/tongue/throat swelling, SOB or lightheadedness with hypotension: Unknown Has patient had a PCN reaction causing severe rash involving mucus membranes or skin necrosis: Unknown Has patient had a PCN reaction that required hospitalization: No Has patient had a PCN reaction occurring within the last 10 years: No If all of the above answers are "NO", then may proceed with Cephalosporin use.      Medication List    ASK your doctor about these medications   allopurinol 300 MG tablet Commonly known as: ZYLOPRIM Take 300 mg by  mouth every morning.   apixaban 5 MG Tabs tablet Commonly known as: Eliquis Take 1 tablet (5 mg total) by mouth 2 (two) times daily.   atorvastatin 80 MG tablet Commonly known as: LIPITOR Take 1 tablet (80 mg total) by mouth daily.   diclofenac 75 MG EC tablet Commonly known as: VOLTAREN Take 75 mg by mouth 2 (two) times daily.   diltiazem 120 MG 24 hr capsule Commonly known as: CARDIZEM CD TAKE 1 CAPSULE BY MOUTH EVERY DAY   Fish Oil 1200 MG Caps Take 1,200 mg by mouth 2 (two) times daily.   fluticasone 50 MCG/ACT nasal spray Commonly known as: FLONASE Place 2 sprays into the nose daily.   folic acid 1 MG tablet Commonly known as: FOLVITE TAKE ONE TABLET BY MOUTH EVERY DAY   lisinopril 40 MG tablet Commonly known as: ZESTRIL TAKE 1 TABLET BY MOUTH EVERY DAY   loratadine 10 MG tablet Commonly known as: CLARITIN Take 10 mg by mouth every morning.   magnesium oxide 400 MG tablet Commonly known as: MAG-OX Take 400 mg by mouth at bedtime.   meclizine 32 MG tablet Commonly known as: ANTIVERT Take 32 mg by mouth daily as needed for dizziness.   nitroGLYCERIN 0.4 MG SL tablet Commonly known as: NITROSTAT PLACE 1 TABLET (0.4 MG TOTAL) UNDER THE TONGUE EVERY 5 (FIVE)  MINUTES AS NEEDED.   polyethylene glycol 17 g packet Commonly known as: MIRALAX / GLYCOLAX Take 17 g by mouth 3 (three) times a week.   triamterene-hydrochlorothiazide 37.5-25 MG tablet Commonly known as: MAXZIDE-25 Take 1 tablet by mouth every morning.   Vitamin B-12 2500 MCG Subl Place 2,500 mcg under the tongue every morning.   Vitamin D-3 25 MCG (1000 UT) Caps Take 1,000 Units by mouth every morning.     The patient has been discharged on:   1.Beta Blocker:  Yes [  x ]                              No   [   ]                              If No, reason:  2.Ace Inhibitor/ARB: Yes Blue.Reese   ]                                     No  [    ]                                     If No,  reason:  3.Statin:   Yes [ x  ]                  No  [   ]                  If No, reason:  4.Ecasa:  Yes  [ x  ]                  No   [   ]                  If No, reason:  Follow Up Appointments: Follow-up Information    [facility]. Schedule an appointment as soon as possible for a visit in 4 week(s).   Specialty: Neurology Contact information: [address] [place]       Herminio Commons, MD. Call.   Specialty: Cardiology Why: for a follow up appointment for 2 weeks  Contact information: [place]  [address] [phone]        Ivin Poot, MD. Call.   Specialty: Cardiothoracic Surgery Why: for a follow up appointment for 4 weeks;PA/LAT CXR to be taken 30 minutes prior to office appointment Contact information: [address] [place] [address] [phone]        [name], MD. Call.   Specialty: Internal Medicine Why: for a follow up appointment regarding further surveillance of HGA1C 5.6 (pre diabetes) Contact information: [address] [place]  [address] [phone]        Evans Lance, MD .   Specialty: Cardiology Contact information: [place] [address] [phone]           Signed: Gaspar Bidding 03/28/2019, 1:27 PM   patient examined and medical record reviewed,agree with above note. Tharon Aquas Trigt III 03/28/2019

## 2019-03-13 NOTE — Progress Notes (Addendum)
TCD       has been completed. Preliminary results can be found under CV proc through chart review. June Leap, BS, RDMS, RVT   Carotid duplex 03/09/19 Bil 1-39%. Can be found under "Doppler pre CABG"

## 2019-03-13 NOTE — Progress Notes (Signed)
3 Days Post-Op Procedure(s) (LRB): CORONARY ARTERY BYPASS GRAFTING (CABG)X 3 Using Left Internal Mammary Artery and Right Saphenous Vein Grafts (N/A) TRANSESOPHAGEAL ECHOCARDIOGRAM (TEE) (N/A) Subjective: Continues in atrial fibrillation on amiodarone-start low-dose Cardizem Walks yesterday but today has left leg weakness, head CT scan shows recent right subcortical infarct and previous mini strokes Postop creatinine continues to increase now 2.2-avoid IV contrast Blood pressure satisfactory wean milrinone to .125 Objective: Vital signs in last 24 hours: Temp:  [97.5 F (36.4 C)-99.2 F (37.3 C)] 99.2 F (37.3 C) (10/05 0400) Pulse Rate:  [95-129] 109 (10/05 0800) Cardiac Rhythm: Atrial fibrillation (10/05 0400) Resp:  [12-34] 15 (10/05 0800) BP: (56-172)/(44-148) 106/72 (10/05 0800) SpO2:  [79 %-99 %] 94 % (10/05 0800) Weight:  [127.8 kg] 127.8 kg (10/05 0500)  Hemodynamic parameters for last 24 hours:  afib  Intake/Output from previous day: 10/04 0701 - 10/05 0700 In: 915.1 [P.O.:180; I.V.:735.1] Out: 925 [Urine:725; Chest Tube:200] Intake/Output this shift: Total I/O In: -  Out: 50 [Urine:50]       Exam    General- alert and comfortable    Neck- no JVD, no cervical adenopathy palpable, no carotid bruit   Lungs- clear without rales, wheezes   Cor- afib 110, no murmur , no gallop   Abdomen- soft, non-tender   Extremities - warm, non-tender, minimal edema   Neuro- oriented, appropriate,  + LLE  focal weakness   Lab Results: Recent Labs    03/12/19 0653 03/13/19 0435  WBC 15.3* 12.8*  HGB 12.1* 11.0*  HCT 35.9* 32.3*  PLT 122* 127*   BMET:  Recent Labs    03/12/19 0653 03/13/19 0435  NA 132* 129*  K 4.0 3.8  CL 99 94*  CO2 24 25  GLUCOSE 181* 134*  BUN 27* 36*  CREATININE 2.00* 2.21*  CALCIUM 8.2* 8.3*    PT/INR:  Recent Labs    03/10/19 1355  LABPROT 16.6*  INR 1.4*   ABG    Component Value Date/Time   PHART 7.410 03/11/2019 0646   HCO3  23.9 03/11/2019 0646   TCO2 25 03/11/2019 0646   ACIDBASEDEF 3.0 (H) 03/10/2019 1833   O2SAT 92.0 03/11/2019 0646   CBG (last 3)  Recent Labs    03/12/19 2008 03/12/19 2355 03/13/19 0347  GLUCAP 182* 113* 107*    Assessment/Plan: S/P Procedure(s) (LRB): CORONARY ARTERY BYPASS GRAFTING (CABG)X 3 Using Left Internal Mammary Artery and Right Saphenous Vein Grafts (N/A) TRANSESOPHAGEAL ECHOCARDIOGRAM (TEE) (N/A) CABG x3 NONSTEMI HTN Afib, paroxysmal and postop - add diltiazem to amiodarone Postop  mild increase creatinine- 2.2 DM Postop R subcortical infarct- neuro following- hold anticoaguation for now   LOS: 6 days    Tharon Aquas Trigt III 03/13/2019

## 2019-03-13 NOTE — Progress Notes (Signed)
Placed patient on home CPAP for the night with oxygen set at 2lpm  

## 2019-03-13 NOTE — Significant Event (Signed)
Rapid Response Event Note  Called by bedside RN for increased weakness in L side. Per RN pt was moved from chair to bed with some LLE weakness and "drag".     On arrival pt alert in bed. Initial NIH 5. LVO negative. Dr Aroor called and pt brought for stat head CT (negative), did not perform CTA d/t AKI and pt with hx of carotid stenosis. Will plan for MRA when pacing wires removed. Plan of care: keep HOB flat, try to increase pt BP, frequent neuro checks.   Sherilyn Dacosta

## 2019-03-13 NOTE — Addendum Note (Signed)
Addendum  created 03/13/19 1218 by Roberts Gaudy, MD   Pend clinical note

## 2019-03-13 NOTE — Consult Note (Addendum)
Requesting Physician: Dr. Prescott Gum    Chief Complaint: Left side weakness  History obtained from: Patient and Chart    HPI:                                                                                                                                       Martin [name] is a 63 y.o. male with PMH signifincant for Afib on Eliquis, HTN, HLD, CAD with PCI, OSA on CPAP admitted on 9/30  after presenting with untable angina. LHC showed 3 vessel disease and patient underwent CABG on 03/10/19. Patient was doing fine post-operatively and walking yesterday morning (10/4). However, on shift change at 7 PM the nurse noted he was dragging his leg on his way from the bathroom. This morning patient noted to be significantly weaker on the left side and Neurology was consulted. He is also slightly confused.  Eliquis has been held due to recent surgery, patient on Lovenox for DVT prophylaxis.  On assessment, patient has is antigravity in the left arm and unable to move his left leg.Patient was immediately taken to CT scan which showed no hemorrhage.  CT head shows acute infarct in the left ACA territory.  CTA was not performed as patient has acute AKI.   Date last known well: 10.4.20 Time last known well: Unclear, in the morning/afternoon tPA Given: no, outside window  NIHSS: 6 Baseline MRS 0    Past Medical History:  Diagnosis Date  . Atrial fibrillation ([place])    a. dx 10/2017. b. s/p DCCV on 11/19/2017 with successful conversion to NSR but back in AFIB 3 weeks later  . CAD (coronary artery disease)    a. s/p PCI to LAD 2001. b. s/p stenting to the RCA and mid LAD December 2011, residual distal RCA disease treated medically. b. nuc 02/2013 abnormal with fixed defect seen in inferoapical, mid-inferior, and basal inferior walls, indicative of myocardial scar, no evidence of ischemia, EF 41% (Ef 55-60% by echo same time).  . Carotid artery disease ([place])    carotid Doppler course left side 5069% stenosis   . Dysrhythmia    AFib  . Habitual alcohol use   . Hyperlipidemia   . Hypertension   . Low back pain   . OSA on CPAP   . Osteoarthritis   . Polysubstance abuse ([place])    use including marijuana and vicodin,which he obtained from the street  . PVC's (premature ventricular contractions)    status post Holter monitor normal sinus rhythm otherwise asymptomatic PVCs.  . Seasonal allergies     Past Surgical History:  Procedure Laterality Date  . CARDIOVERSION N/A 11/19/2017   Procedure: CARDIOVERSION;  Surgeon: Herminio Commons, MD;  Location: [facility];  Service: Cardiovascular;  Laterality: N/A;  . CORONARY ANGIOPLASTY WITH STENT PLACEMENT  2011  . LEFT HEART CATH AND CORONARY ANGIOGRAPHY N/A 03/08/2019   Procedure: LEFT HEART CATH  AND CORONARY ANGIOGRAPHY;  Surgeon: Belva Crome, MD;  Location: [facility];  Service: Cardiovascular;  Laterality: N/A;  . TONSILLECTOMY      Family History  Problem Relation Age of Onset  . Stroke Mother   . Other Mother        small bowel obstruction  . Heart attack Father   . Heart attack Brother    Social History:  reports that he quit smoking about 19 years ago. His smoking use included cigarettes. He started smoking about 51 years ago. He has a 35.00 pack-year smoking history. He has never used smokeless tobacco. He reports current alcohol use of about 42.0 standard drinks of alcohol per week. He reports current drug use. Drug: Marijuana.  Allergies:  Allergies  Allergen Reactions  . Penicillins Hives and Rash    Has patient had a PCN reaction causing immediate rash, facial/tongue/throat swelling, SOB or lightheadedness with hypotension: Unknown Has patient had a PCN reaction causing severe rash involving mucus membranes or skin necrosis: Unknown Has patient had a PCN reaction that required hospitalization: No Has patient had a PCN reaction occurring within the last 10 years: No If all of the above answers are "NO", then may  proceed with Cephalosporin use.      Medications:                                                                                                                        I reviewed home medications   ROS:                                                                                                                                     14 systems reviewed and negative except above    Examination:                                                                                                      General: Appears well-developed  Psych: Affect appropriate to situation  Eyes: No scleral injection HENT: No OP obstrucion Head: Normocephalic.  Cardiovascular: Normal rate and regular rhythm. Respiratory: Effort normal and breath sounds normal to anterior ascultation GI: Soft.  No distension. There is no tenderness.  Skin: WDI   Neurological Examination Mental Status: Alert, oriented, thought content appropriate.  Speech fluent without evidence of aphasia. Able to follow commands without difficulty. Cranial Nerves: II: Visual fields grossly normal,  III,IV, VI: ptosis not present, extra-ocular motions intact bilaterally, pupils equal, round, reactive to light and accommodation V,VII: smile symmetric, facial light touch sensation normal bilaterally VIII: hearing normal bilaterally IX,X: uvula rises symmetrically XI: bilateral shoulder shrug XII: midline tongue extension Motor: Right : Upper extremity   5/5    Left:     Upper extremity   3/5  Lower extremity   5/5     Lower extremity   0/5 Tone and bulk:normal tone throughout; no atrophy noted Sensory: Pinprick and light touch intact throughout, bilaterally Plantars: Right: downgoing   Left: downgoing Cerebellar: no gross ataxia on right side       Lab Results: Basic Metabolic Panel: Recent Labs  Lab 03/10/19 1857 03/11/19 0409 03/11/19 0646 03/11/19 1712 03/12/19 0335 03/12/19 0653 03/13/19 0435  NA 137 134* 138 134*  133* 132* 129*  K 3.8 3.9 4.0 4.2 4.1 4.0 3.8  CL 107 105  --  101 98 99 94*  CO2 22 21*  --  23 24 24 25   GLUCOSE 132* 134*  --  174* 153* 181* 134*  BUN 14 14  --  20 27* 27* 36*  CREATININE 0.75 0.88  --  1.42* 1.81* 2.00* 2.21*  CALCIUM 7.7* 7.7*  --  8.3* 8.2* 8.2* 8.3*  MG 2.7* 2.2  --  2.1  --  2.1  --     CBC: Recent Labs  Lab 03/11/19 0409 03/11/19 0646 03/11/19 1712 03/12/19 0335 03/12/19 0653 03/13/19 0435  WBC 14.8*  --  17.7* 14.5* 15.3* 12.8*  HGB 13.6 13.3 13.1 11.8* 12.1* 11.0*  HCT 40.1 39.0 40.2 35.4* 35.9* 32.3*  MCV 98.5  --  102.3* 100.9* 101.1* 98.8  PLT 137*  --  158 130* 122* 127*    Coagulation Studies: Recent Labs    03/10/19 1355  LABPROT 16.6*  INR 1.4*    Imaging: Dg Chest Port 1 View  Result Date: 03/12/2019 CLINICAL DATA:  Status post surgery. EXAM: PORTABLE CHEST 1 VIEW COMPARISON:  03/11/2019 FINDINGS: Interval removal of the Swan-Ganz catheter. Right jugular central venous sheath remains in place. Left-sided chest tube in unchanged position. No left pneumothorax. Mild left basilar atelectasis. Right basilar airspace disease likely reflecting atelectasis with a small right pleural effusion. No right pneumothorax. Stable cardiomediastinal silhouette. Recent CABG. IMPRESSION: 1. Left-sided chest tube in unchanged position. No left pneumothorax. Mild left basilar atelectasis. 2. Right basilar atelectasis with a small right pleural effusion. Electronically Signed   By: Kathreen Devoid   On: 03/12/2019 09:11   Dg Chest Port 1 View  Result Date: 03/11/2019 CLINICAL DATA:  CABG x3 03/10/2019. EXAM: PORTABLE CHEST 1 VIEW COMPARISON:  03/10/2019 FINDINGS: Right IJ Swan-Ganz catheter unchanged with tip over the right main pulmonary artery. Left-sided chest tube unchanged. Mediastinal drain unchanged. Lungs are adequately inflated with very minimal bibasilar density likely small amount of bilateral pleural fluid/atelectasis. Mild stable cardiomegaly.  Remainder of the exam is unchanged. IMPRESSION: Suggestion of small amount of bilateral pleural fluid/atelectasis. Stable cardiomegaly. Tubes and lines as described. Electronically Signed   By: Quillian Quince  Derrel Nip M.D.   On: 03/11/2019 08:46       ASSESSMENT AND PLAN  63 y.o. male with PMH signifincant for Afib on Eliquis, HTN, HLD, CAD with PCI, OSA on CPAP admitted on 9/30 status post CABG on 03/10/19.  On chart review also noted to have right carotid stenosis 50 to 69% on carotid Doppler.  Patient doing fine post-operatively and walking yesterday morning (10/4).  Neurology consulted due to left-sided weakness : left leg significantly greater than left upper extremity.    CT head demonstrates a right ACA stroke, appears established.  Unfortunately, patient not a candidate for TPA as he is outside the window. Ideally, would have like to obtain a CT angiogram to obtain vessel imaging and rule out large vessel occlusion, however patient has AKI with GFR 30, from 63 days ago.  Clinically, patient does not have any sensory or visual neglect and therefore the arm and motor deficits would be explained by the right ACA stroke.  MRI brain and MR angiogram was also considered but patient has pacer wires therefore cannot be done.  Recommendations #Repeat CT in 24 hours #Hold anticoagulation for now until size of infarct established on repeat CT head #Frequent neurochecks, if patient's NIH strokes increases (especially if he starts showing signs of neglect) obtain stat CT angiogram head and neck despite risk for worsening AKI to renal failure. #Carotid Dopplers and transcranial Doppler #Blood pressure goals : permissive hypertension, avoid hypotension Flat /Trendelenburg position and bedrest, avoid ambulation MRI brain when possible A1c and lipid profile Continue aspirin and atorvastatin   Stroke team will follow.  Patient is neurologically critically ill due to right ACA stroke resulting in left  hemiparesis, cardiac surgery.  He is at high risk for neurological deterioration due to expansion of stroke.   Mays Paino [facility] Pager Number [phone]

## 2019-03-13 NOTE — Progress Notes (Signed)
STROKE TEAM PROGRESS NOTE   SUBJECTIVE (INTERVAL HISTORY) His RN is at the bedside.  Overall his condition is stable. He still has left leg plegic and left arm 3-/5 proximal and 4/5 distal, fully orientated. His BP was low yesterday am and currently still in afib with RVR. On amiodarone and cardizem drip and on metoprolol PO. Chest tube removed today. Currently BP 134/76. Pt denies any headache, only asking for water.   OBJECTIVE Temp:  [97.5 F (36.4 C)-99.2 F (37.3 C)] 98.7 F (37.1 C) (10/05 0923) Pulse Rate:  [95-127] 108 (10/05 1000) Cardiac Rhythm: Atrial fibrillation (10/05 0400) Resp:  [12-21] 17 (10/05 1000) BP: (82-172)/(55-148) 99/66 (10/05 1000) SpO2:  [86 %-99 %] 95 % (10/05 1000) Weight:  [127.8 kg] 127.8 kg (10/05 0500)  Recent Labs  Lab 03/12/19 1534 03/12/19 2008 03/12/19 2355 03/13/19 0347 03/13/19 0842  GLUCAP 155* 182* 113* 107* 132*   Recent Labs  Lab 03/10/19 1857 03/11/19 0409 03/11/19 0646 03/11/19 1712 03/12/19 0335 03/12/19 0653 03/13/19 0435  NA 137 134* 138 134* 133* 132* 129*  K 3.8 3.9 4.0 4.2 4.1 4.0 3.8  CL 107 105  --  101 98 99 94*  CO2 22 21*  --  23 24 24 25   GLUCOSE 132* 134*  --  174* 153* 181* 134*  BUN 14 14  --  20 27* 27* 36*  CREATININE 0.75 0.88  --  1.42* 1.81* 2.00* 2.21*  CALCIUM 7.7* 7.7*  --  8.3* 8.2* 8.2* 8.3*  MG 2.7* 2.2  --  2.1  --  2.1  --    Recent Labs  Lab 03/09/19 0430 03/12/19 0653 03/12/19 0803 03/13/19 0435  AST 25 33 35 26  ALT 28 25 26 22   ALKPHOS 56 50 50 53  BILITOT 1.1 1.2 1.7* 0.8  PROT 6.7 5.9* 6.0* 5.4*  ALBUMIN 3.9 3.0* 3.1* 2.7*   Recent Labs  Lab 03/11/19 0409 03/11/19 0646 03/11/19 1712 03/12/19 0335 03/12/19 0653 03/13/19 0435  WBC 14.8*  --  17.7* 14.5* 15.3* 12.8*  HGB 13.6 13.3 13.1 11.8* 12.1* 11.0*  HCT 40.1 39.0 40.2 35.4* 35.9* 32.3*  MCV 98.5  --  102.3* 100.9* 101.1* 98.8  PLT 137*  --  158 130* 122* 127*   No results for input(s): CKTOTAL, CKMB, CKMBINDEX,  TROPONINI in the last 168 hours. Recent Labs    03/10/19 1355  LABPROT 16.6*  INR 1.4*   Recent Labs    03/11/19 1712  COLORURINE AMBER*  LABSPEC 1.023  PHURINE 5.0  GLUCOSEU NEGATIVE  HGBUR LARGE*  BILIRUBINUR NEGATIVE  KETONESUR NEGATIVE  PROTEINUR 30*  NITRITE NEGATIVE  LEUKOCYTESUR TRACE*       Component Value Date/Time   CHOL 170 03/07/2019 2358   TRIG 218 (H) 03/07/2019 2358   HDL 41 03/07/2019 2358   CHOLHDL 4.1 03/07/2019 2358   VLDL 44 (H) 03/07/2019 2358   LDLCALC 85 03/07/2019 2358   Lab Results  Component Value Date   HGBA1C 5.6 03/09/2019      Component Value Date/Time   LABOPIA NONE DETECTED 05/20/2010 1326   COCAINSCRNUR NONE DETECTED 05/20/2010 1326   LABBENZ POSITIVE (A) 05/20/2010 1326   AMPHETMU NONE DETECTED 05/20/2010 1326   THCU NONE DETECTED 05/20/2010 1326   LABBARB  05/20/2010 1326    NONE DETECTED        DRUG SCREEN FOR MEDICAL PURPOSES ONLY.  IF CONFIRMATION IS NEEDED FOR ANY PURPOSE, NOTIFY LAB WITHIN 5 DAYS.  LOWEST DETECTABLE LIMITS FOR URINE DRUG SCREEN Drug Class       Cutoff (ng/mL) Amphetamine      1000 Barbiturate      200 Benzodiazepine   [id number] Tricyclics       [id number] Opiates          300 Cocaine          300 THC              50    No results for input(s): ETH in the last 168 hours.  I have personally reviewed the radiological images below and agree with the radiology interpretations.  Dg Chest 1 View  Result Date: 03/10/2019 CLINICAL DATA:  Status post CABG, rule out pneumo. EXAM: CHEST  1 VIEW COMPARISON:  03/07/2019 FINDINGS: Now status post median sternotomy and CABG. Swan-Ganz catheter in proximal right pulmonary artery. A transesophageal echocardiographic pro passes to the level of the T10 vertebral body. Seven intact sternal wires are noted. Left-sided chest tube in place without signs of pneumothorax on supine radiograph. Cardiomediastinal contours with mild widening superiorly likely secondary to  postoperative change otherwise unchanged accounting for low lung volumes. There is a single tube in the midline projecting over sternal wires extending to the level of the sternal manubrium. This may represent mediastinal drain. IMPRESSION: 1. Status post CABG with left chest tube without visible pneumothorax. 2. Swan-Ganz catheter and transesophageal echocardiographic probe in place. 3. Midline drainage tubing projects over the mediastinum, potentially mediastinal drain, clinical correlation suggested. Electronically Signed   By: Zetta Bills M.D.   On: 03/10/2019 13:30   Dg Chest 2 View  Result Date: 03/07/2019 CLINICAL DATA:  Chest pain for 2 weeks. EXAM: CHEST - 2 VIEW COMPARISON:  10/28/2017 and prior radiographs FINDINGS: Cardiomegaly and LEFT basilar scarring again noted. There is no evidence of focal airspace disease, pulmonary edema, suspicious pulmonary nodule/mass, pleural effusion, or pneumothorax. No acute bony abnormalities are identified. IMPRESSION: Cardiomegaly without evidence of acute cardiopulmonary disease. Electronically Signed   By: Margarette Canada M.D.   On: 03/07/2019 14:30   Ct Head Wo Contrast  Result Date: 03/13/2019 CLINICAL DATA:  Generalized muscle weakness. Left-sided weakness EXAM: CT HEAD WITHOUT CONTRAST TECHNIQUE: Contiguous axial images were obtained from the base of the skull through the vertex without intravenous contrast. COMPARISON:  Brain MRI 04/18/2018 FINDINGS: Brain: Small area of cytotoxic appearance in the parasagittal and posterior right frontal region, distal ACA territory. No hemorrhage, hydrocephalus, or masslike finding. Low-density in the cerebral white matter that is fairly symmetric and likely chronic microvascular ischemia but progressed from MR. Small cortically based calcification along the right hemisphere, nonspecific in isolation. Vascular: Atherosclerotic calcification. Skull: Negative. Sinuses/Orbits: Negative. Other: These results were  communicated to Dr. Lorraine Lax at 6:52 amon 10/5/2020by text page via the [facility] messaging system. IMPRESSION: 1. Small acute cortical infarct in the distal right ACA territory. 2. Cerebral white matter low-density that is mild and symmetric but may have progressed from 2019, question right watershed white matter infarcts as well. Electronically Signed   By: Monte Fantasia M.D.   On: 03/13/2019 06:58   Dg Chest Port 1 View  Result Date: 03/13/2019 CLINICAL DATA:  Chest tube placement EXAM: PORTABLE CHEST 1 VIEW COMPARISON:  03/12/2019 FINDINGS: The left-sided chest tube is stable in positioning. The right-sided central venous catheter is stable in positioning. The heart size remains significantly enlarged. The patient is status post prior median sternotomy. There are bilateral pleural effusions with adjacent atelectasis. There is no  pneumothorax. There is generalized volume overload. IMPRESSION: 1. Lines and tubes as above. 2. No pneumothorax. 3. Cardiomegaly with persistent bilateral pleural effusions and adjacent atelectasis. Electronically Signed   By: Constance Holster M.D.   On: 03/13/2019 08:02   Dg Chest Port 1 View  Result Date: 03/12/2019 CLINICAL DATA:  Status post surgery. EXAM: PORTABLE CHEST 1 VIEW COMPARISON:  03/11/2019 FINDINGS: Interval removal of the Swan-Ganz catheter. Right jugular central venous sheath remains in place. Left-sided chest tube in unchanged position. No left pneumothorax. Mild left basilar atelectasis. Right basilar airspace disease likely reflecting atelectasis with a small right pleural effusion. No right pneumothorax. Stable cardiomediastinal silhouette. Recent CABG. IMPRESSION: 1. Left-sided chest tube in unchanged position. No left pneumothorax. Mild left basilar atelectasis. 2. Right basilar atelectasis with a small right pleural effusion. Electronically Signed   By: Kathreen Devoid   On: 03/12/2019 09:11   Dg Chest Port 1 View  Result Date: 03/11/2019 CLINICAL DATA:   CABG x3 03/10/2019. EXAM: PORTABLE CHEST 1 VIEW COMPARISON:  03/10/2019 FINDINGS: Right IJ Swan-Ganz catheter unchanged with tip over the right main pulmonary artery. Left-sided chest tube unchanged. Mediastinal drain unchanged. Lungs are adequately inflated with very minimal bibasilar density likely small amount of bilateral pleural fluid/atelectasis. Mild stable cardiomegaly. Remainder of the exam is unchanged. IMPRESSION: Suggestion of small amount of bilateral pleural fluid/atelectasis. Stable cardiomegaly. Tubes and lines as described. Electronically Signed   By: Marin Olp M.D.   On: 03/11/2019 08:46   Dg Chest Port 1 View  Result Date: 03/10/2019 CLINICAL DATA:  S/p CABG x 3, chest tube in place, pt intubated. EXAM: PORTABLE CHEST 1 VIEW COMPARISON:  Chest radiograph 03/10/2019 at 1:09 p.m. FINDINGS: Stable enlarged cardiomediastinal contours status post median sternotomy. Interval intubation with endotracheal tube tip projecting between the thoracic inlet and carina. Stable remaining support apparatus including a PA catheter, enteric tube and left chest tube. The lungs are well aerated. No new focal pulmonary opacity. No pneumothorax or large pleural effusion. IMPRESSION: 1. Interval intubation with endotracheal tube tip projecting between the thoracic inlet and carina. 2. Postoperative appearance of the chest. Electronically Signed   By: Audie Pinto M.D.   On: 03/10/2019 15:08   Vas US Doppler Pre Cabg  Result Date: 03/09/2019 PREOPERATIVE VASCULAR EVALUATION  Indications:      Pre CABG evaluation. Risk Factors:     Hypertension, hyperlipidemia, coronary artery disease. Other Factors:    Atrial fibrillation. Comparison Study: Prior study done 05/23/16 is available for comparison Performing Technologist: Birdena Crandall, [place] Technologist: Sharion Dove RVS  Examination Guidelines: A complete evaluation includes B-mode imaging, spectral Doppler, color Doppler, and power Doppler  as needed of all accessible portions of each vessel. Bilateral testing is considered an integral part of a complete examination. Limited examinations for reoccurring indications may be performed as noted.  Right Carotid Findings: +----------+--------+--------+--------+--------+------------------+           PSV cm/sEDV cm/sStenosisDescribeComments           +----------+--------+--------+--------+--------+------------------+ CCA Prox  46      6               calcific                   +----------+--------+--------+--------+--------+------------------+ CCA Distal42      7                       intimal thickening +----------+--------+--------+--------+--------+------------------+ ICA Prox  46  14              calcificShadowing          +----------+--------+--------+--------+--------+------------------+ ICA Distal52      11                                         +----------+--------+--------+--------+--------+------------------+ ECA       108     11                                         +----------+--------+--------+--------+--------+------------------+ Portions of this table do not appear on this page. +----------+--------+-------+--------+------------+           PSV cm/sEDV cmsDescribeArm Pressure +----------+--------+-------+--------+------------+ Subclavian161                    145          +----------+--------+-------+--------+------------+ +---------+--------+--+--------+--+ VertebralPSV cm/s34EDV cm/s11 +---------+--------+--+--------+--+ Left Carotid Findings: +----------+--------+--------+--------+------------+------------------+           PSV cm/sEDV cm/sStenosisDescribe    Comments           +----------+--------+--------+--------+------------+------------------+ CCA Prox  55      13              homogeneous                    +----------+--------+--------+--------+------------+------------------+ CCA Distal57      12                           intimal thickening +----------+--------+--------+--------+------------+------------------+ ICA Prox  66      22              heterogenous                   +----------+--------+--------+--------+------------+------------------+ ICA Distal77      21                          tortuous           +----------+--------+--------+--------+------------+------------------+ ECA       184     19                                             +----------+--------+--------+--------+------------+------------------+ +----------+--------+--------+--------+------------+ SubclavianPSV cm/sEDV cm/sDescribeArm Pressure +----------+--------+--------+--------+------------+           220                     145          +----------+--------+--------+--------+------------+ +---------+--------+--+--------+-+ VertebralPSV cm/s33EDV cm/s7 +---------+--------+--+--------+-+  ABI Findings: +---------+------------------+-----+----------+--------+ Right    Rt Pressure (mmHg)IndexWaveform  Comment  +---------+------------------+-----+----------+--------+ Brachial 145                    triphasic          +---------+------------------+-----+----------+--------+ PTA      104               0.72 monophasic         +---------+------------------+-----+----------+--------+ DP       117  0.81 monophasic         +---------+------------------+-----+----------+--------+ Great Toe76                0.52                    +---------+------------------+-----+----------+--------+ +---------+------------------+-----+---------+-------+ Left     Lt Pressure (mmHg)IndexWaveform Comment +---------+------------------+-----+---------+-------+ Brachial 145                    triphasic        +---------+------------------+-----+---------+-------+ PTA      186               1.28 biphasic         +---------+------------------+-----+---------+-------+ DP        175               1.21 biphasic         +---------+------------------+-----+---------+-------+ Great Toe118               0.81                  +---------+------------------+-----+---------+-------+ +-------+---------------+----------------+ ABI/TBIToday's ABI/TBIPrevious ABI/TBI +-------+---------------+----------------+ Right  0.81/0.52      No previous      +-------+---------------+----------------+ Left   1.28/0.81      No previous      +-------+---------------+----------------+  Right Doppler Findings: +-----------+--------+-----+---------+--------+ Site       PressureIndexDoppler  Comments +-----------+--------+-----+---------+--------+ Brachial   145          triphasic         +-----------+--------+-----+---------+--------+ Radial                  triphasic         +-----------+--------+-----+---------+--------+ Ulnar                   triphasic         +-----------+--------+-----+---------+--------+ Palmar Arch                      normal   +-----------+--------+-----+---------+--------+  Left Doppler Findings: +-----------+--------+-----+---------+--------+ Site       PressureIndexDoppler  Comments +-----------+--------+-----+---------+--------+ Brachial   145          triphasic         +-----------+--------+-----+---------+--------+ Radial                  triphasic         +-----------+--------+-----+---------+--------+ Ulnar                   triphasic         +-----------+--------+-----+---------+--------+ Palmar Arch                      normal   +-----------+--------+-----+---------+--------+  Summary: Right Carotid: Velocities in the right ICA are consistent with a 1-39% stenosis.                No change since study done 05/23/16. Left Carotid: Velocities in the left ICA are consistent with a 1-39% stenosis.               No change since study done 05/23/16. Vertebrals:  Bilateral vertebral arteries demonstrate  antegrade flow. Subclavians: Normal flow hemodynamics were seen in bilateral subclavian              arteries. Right ABI: Resting right ankle-brachial index indicates mild right lower extremity arterial disease. The right toe-brachial index is abnormal. Left ABI: Resting left ankle-brachial  index is within normal range. No evidence of significant left lower extremity arterial disease. The left toe-brachial index is normal. Right Upper Extremity: Doppler waveforms remain within normal limits with right radial compression. Doppler waveforms remain within normal limits with right ulnar compression. Left Upper Extremity: Doppler waveforms remain within normal limits with left radial compression. Doppler waveforms remain within normal limits with left ulnar compression.  Electronically signed by Servando Snare MD on 03/09/2019 at 5:45:33 PM.    Final    Vas [place] Transcranial Doppler  Result Date: 03/13/2019  Transcranial Doppler Indications: ICH. History: Post op CABG 03/10/19. Hx TIA. New onset left side weakness 03/12/19. Limitations for diagnostic windows: Unable to insonate occipital window. Comparison Study: no prior Performing Technologist: June Leap RDMS, RVT  Examination Guidelines: A complete evaluation includes B-mode imaging, spectral Doppler, color Doppler, and power Doppler as needed of all accessible portions of each vessel. Bilateral testing is considered an integral part of a complete examination. Limited examinations for reoccurring indications may be performed as noted.  +----------+-------------+----------+-----------+--------------+ RIGHT TCD Right VM (cm)Depth (cm)Pulsatility   Comment     +----------+-------------+----------+-----------+--------------+ MCA           63.00                 1.18                   +----------+-------------+----------+-----------+--------------+ ACA          -28.00                 0.97                    +----------+-------------+----------+-----------+--------------+ Term ICA      56.00                   1                    +----------+-------------+----------+-----------+--------------+ PCA           18.00                 1.11                   +----------+-------------+----------+-----------+--------------+ Opthalmic     22.00                 1.52                   +----------+-------------+----------+-----------+--------------+ ICA siphon    38.00                 1.17                   +----------+-------------+----------+-----------+--------------+ Vertebral                                   not visualized +----------+-------------+----------+-----------+--------------+  +----------+------------+----------+-----------+--------------+ LEFT TCD  Left VM (cm)Depth (cm)Pulsatility   Comment     +----------+------------+----------+-----------+--------------+ MCA          67.00                 0.84                   +----------+------------+----------+-----------+--------------+ ACA          -24.00                1.06                   +----------+------------+----------+-----------+--------------+  Term ICA     60.00                 1.08                   +----------+------------+----------+-----------+--------------+ PCA          44.00                 1.02                   +----------+------------+----------+-----------+--------------+ Opthalmic    19.00                 1.66                   +----------+------------+----------+-----------+--------------+ ICA siphon   38.00                 0.95                   +----------+------------+----------+-----------+--------------+ Vertebral                                  not visualized +----------+------------+----------+-----------+--------------+  +------------+-------+------------------------+             VM cm/s        Comment          +------------+-------+------------------------+  Prox Basilar-14.00 Poorly visualized window +------------+-------+------------------------+    Preliminary     PHYSICAL EXAM  Temp:  [97.5 F (36.4 C)-99.2 F (37.3 C)] 98.7 F (37.1 C) (10/05 0923) Pulse Rate:  [95-127] 108 (10/05 1000) Resp:  [12-21] 17 (10/05 1000) BP: (82-172)/(55-148) 99/66 (10/05 1000) SpO2:  [86 %-99 %] 95 % (10/05 1000) Weight:  [127.8 kg] 127.8 kg (10/05 0500)  General - Well nourished, well developed, in no apparent distress.  Ophthalmologic - fundi not visualized due to noncooperation.  Cardiovascular - irregularly irregular heart rate and rhythm with afib RVR on tele.  Mental Status -  Level of arousal and orientation to time, place, and person were intact. Language including expression, naming, repetition, comprehension was assessed and found intact. Fund of Knowledge was assessed and was intact.  Cranial Nerves II - XII - II - Visual field intact OU. III, IV, VI - Extraocular movements intact. V - Facial sensation intact bilaterally. VII - Facial movement intact bilaterally. VIII - Hearing & vestibular intact bilaterally. X - Palate elevates symmetrically. XI - Chin turning & shoulder shrug intact bilaterally. XII - Tongue protrusion intact.  Motor Strength - The patient's strength was normal in RUE and RLE, however, left UE proximal 3-/5 and distal finger grip 4/5. LLE plegic. Bulk was normal and fasciculations were absent.   Motor Tone - Muscle tone was assessed at the neck and appendages and was normal.  Reflexes - The patient's reflexes were symmetrical in all extremities except diminished LLE and he had no pathological reflexes.  Sensory - Light touch, temperature/pinprick were assessed and were symmetrical, however, LLE sensory neglect or inattention with double simultaneous stimulation.    Coordination - The patient had normal movements in right hand with no ataxia or dysmetria.  Tremor was absent.  Gait and Station -  deferred.   ASSESSMENT/PLAN Mr. JERAMYAH SHILLINGTON is a 63 y.o. male with history of A. fib on Eliquis, CAD, hypertension, hyperlipidemia, OSA on CPAP admitted for unstable angina, status post CABG 10/2.  Found to have left lower extremity weakness yesterday  afternoon but worsening this morning with left lower extremity plegic.  No tPA given due to outside window and recent surgery.    Stroke:  right posterior ACA distal infarct, etiology uncertain, could be A. fib while holding on AC versus intracranial atherosclerosis in the setting of low BP  Resultant left arm weakness and left leg plegic  MRI not able to perform due to recent CABG procedure  CT head showed right distal ACA infarct  CTA head and neck not performed due to worsening AKI  Will repeat CT in a.m., if no large stroke, will recommend to resume [facility]  Carotid Doppler 10/1 bilateral ICA 1 to 39% stenosis  TEE 10/2 EF 55 to 60%, no PFO  TCD showed no proximal intracranial large vessel stenosis  LDL 85  HgbA1c 5.6  Lovenox for VTE prophylaxis  Eliquis (apixaban) daily prior to admission, now on aspirin 325 mg daily.  AC on hold so far for recent CABG.  Will repeat CT in a.m., if no large stroke, will recommend heparin IV per stroke protocol for 2 days, if tolerating well, can transition back to Eliquis.  Patient counseled to be compliant with his antithrombotic medications  Ongoing aggressive stroke risk factor management  Therapy recommendations: Pending  Disposition: Pending  Hx of hypertension Hypotension  BP low at 70s-90s on 10/4 from midnight to morning  Pt on cardizem drip, milrinone drip, metoprolol po which all have impact on low BP  Currently BP 130s  Avoid low BP  BP goal 110-140 to maintain cerebral perfusion  afib RVR  Cardiology on board  On amiodarone, cardizem and metoprolol  eliquis on hold for now  Will repeat CT in a.m., if no large stroke, will recommend heparin IV per stroke  protocol for 2 days, if tolerating well, can transition back to Eliquis.  Unstable angina / CAD s/p CABG  CABG 10/2  CTS and cardiology on board  On ASA 325 and lipitor 80 now  AKI   Cre 0.88->2.21  Not able to do CTA head and neck  On IVF  Manage per primary team  Hyperlipidemia  Home meds:  lipitor   LDL 85, goal < 70  Now on lipitor 80  Continue statin at discharge  Other Stroke Risk Factors  Advanced age  Obesity, Body mass index is 42.84 kg/m.   Hx stroke/TIA  Obstructive sleep apnea, on CPAP at home  Other Active Problems  Hyponatremia - Na 129  Leukocytosis WBC 12.8  Thrombocytopenia - Platelet 127  Hospital day # 6  This patient is critically ill due to CAD s/p CABG, stroke, afib RVR, AKI, hypotension and at significant risk of neurological worsening, death form heart failure, recurrent stroke, hemorrhagic conversion, renal failure, shock. This patient's care requires constant monitoring of vital signs, hemodynamics, respiratory and cardiac monitoring, review of multiple databases, neurological assessment, discussion with family, other specialists and medical decision making of high complexity. I spent 30 minutes of neurocritical care time in the care of this patient.  Rosalin Hawking, MD PhD Stroke Neurology 03/13/2019 11:12 AM    To contact Stroke Continuity provider, please refer to [email or web address] After hours, contact General Neurology

## 2019-03-13 NOTE — Progress Notes (Signed)
Upon waking up pt to scale and chair, RN assessed pt's mobility and realized pt was not able to move left side as much as his right. Pt's sensation is intact, pt answers questions appropriately, and is able to swallow. RN called rapid response to evaluate for stroke and notified Gerhardt MD. MD was taken for a scan per MD. Rapid response nurse and Laurel Smeltz RN took pt down for stat CT. Neurologist recommended pt's bed stay flat and check neuro status often. Pt is now in bed, resting with eyes closed, on 8L high flow oxygen, breathing is even and unlabored, denies pain and has no signs of discomfort. Will continue to monitor.

## 2019-03-13 NOTE — Progress Notes (Addendum)
Progress Note  Patient Name: ALIC OBREMSKI Date of Encounter: 03/13/2019  Primary Cardiologist: Kate Sable, MD   Subjective   Stroke diagnosed yesterday - under neurology care.  No chest pain. On amiodarone for afib (started yesterday) - remains in RVR. Anticoagulation on hold d/t stroke. Some VT noted yesterday.   Inpatient Medications    Scheduled Meds: . acetaminophen  1,000 mg Oral Q6H   Or  . acetaminophen (TYLENOL) oral liquid 160 mg/5 mL  1,000 mg Per Tube Q6H  . allopurinol  300 mg Oral q morning - 10a  . aspirin EC  325 mg Oral Daily   Or  . aspirin  324 mg Per Tube Daily  . atorvastatin  80 mg Oral q1800  . bisacodyl  10 mg Oral Daily   Or  . bisacodyl  10 mg Rectal Daily  . Chlorhexidine Gluconate Cloth  6 each Topical Q0600  . docusate sodium  200 mg Oral Daily  . enoxaparin (LOVENOX) injection  40 mg Subcutaneous Q24H  . fluticasone  1 spray Each Nare Daily  . folic acid  1 mg Oral Daily  . furosemide  80 mg Intravenous Daily  . insulin aspart  0-24 Units Subcutaneous TID AC & HS  . insulin detemir  10 Units Subcutaneous Daily  . metoprolol tartrate  12.5 mg Oral BID   Or  . metoprolol tartrate  12.5 mg Per Tube BID  . mupirocin ointment  1 application Nasal BID  . pantoprazole  40 mg Oral Daily  . sodium chloride flush  3 mL Intravenous Q12H  . thiamine  100 mg Intravenous Daily  . vitamin B-12  100 mcg Oral Daily   Continuous Infusions: . sodium chloride Stopped (03/11/19 1110)  . sodium chloride    . sodium chloride 10 mL/hr at 03/10/19 1428  . amiodarone 30 mg/hr (03/13/19 0713)  . diltiazem (CARDIZEM) infusion    . milrinone 0.125 mcg/kg/min (03/13/19 0836)   PRN Meds: sodium chloride, ALPRAZolam, ipratropium-albuterol, metoprolol tartrate, midazolam, morphine injection, ondansetron (ZOFRAN) IV, oxyCODONE, sodium chloride flush, traMADol   Vital Signs    Vitals:   03/13/19 0500 03/13/19 0600 03/13/19 0700 03/13/19 0800  BP:  95/61 116/73 (!) 104/56 106/72  Pulse: (!) 114 (!) 116 (!) 107 (!) 109  Resp: 14 20 14 15   Temp:      TempSrc:      SpO2: 95% 97% 95% 94%  Weight: 127.8 kg     Height:        Intake/Output Summary (Last 24 hours) at 03/13/2019 0843 Last data filed at 03/13/2019 0800 Gross per 24 hour  Intake 777.61 ml  Output 850 ml  Net -72.39 ml   Last 3 Weights 03/13/2019 03/12/2019 03/11/2019  Weight (lbs) 281 lb 12 oz 269 lb 10 oz 273 lb 9.5 oz  Weight (kg) 127.8 kg 122.3 kg 124.1 kg      Telemetry   Sinus rhythm in the 80's.- Personally Reviewed  ECG    N/A  Physical Exam   VS:  BP 106/72   Pulse (!) 109   Temp 99.2 F (37.3 C) (Axillary)   Resp 15   Ht 5\' 8"  (1.727 m)   Wt 127.8 kg   SpO2 94%   BMI 42.84 kg/m  , BMI Body mass index is 42.84 kg/m.   General appearance: alert and no distress Neck: JVD - 3 cm above sternal notch, no carotid bruit and thyroid not enlarged, symmetric, no tenderness/mass/nodules Lungs: diminished breath  sounds bibasilar Heart: irregularly irregular rhythm and tachycardia Abdomen: soft, non-tender; bowel sounds normal; no masses,  no organomegaly and obese Extremities: edema 1+ edema Pulses: 2+ and symmetric Skin: pale, warm, dry Neurologic: Mental status: Alert, oriented, thought content appropriate, left-sided weakness Psych: Pleasant   Labs    High Sensitivity Troponin:   Recent Labs  Lab 03/07/19 1416 03/07/19 1611 03/07/19 2358 03/08/19 0217  TROPONINIHS 71* 82* 69* 57*      Chemistry Recent Labs  Lab 03/12/19 0335 03/12/19 0653 03/12/19 0803 03/13/19 0435  NA 133* 132*  --  129*  K 4.1 4.0  --  3.8  CL 98 99  --  94*  CO2 24 24  --  25  GLUCOSE 153* 181*  --  134*  BUN 27* 27*  --  36*  CREATININE 1.81* 2.00*  --  2.21*  CALCIUM 8.2* 8.2*  --  8.3*  PROT  --  5.9* 6.0* 5.4*  ALBUMIN  --  3.0* 3.1* 2.7*  AST  --  33 35 26  ALT  --  25 26 22   ALKPHOS  --  50 50 53  BILITOT  --  1.2 1.7* 0.8  GFRNONAA 38* 34*  --   30*  GFRAA 44* 39*  --  35*  ANIONGAP 11 9  --  10     Hematology Recent Labs  Lab 03/12/19 0335 03/12/19 0653 03/13/19 0435  WBC 14.5* 15.3* 12.8*  RBC 3.51* 3.55* 3.27*  HGB 11.8* 12.1* 11.0*  HCT 35.4* 35.9* 32.3*  MCV 100.9* 101.1* 98.8  MCH 33.6 34.1* 33.6  MCHC 33.3 33.7 34.1  RDW 13.4 13.3 13.2  PLT 130* 122* 127*    BNP Recent Labs  Lab 03/07/19 2358  BNP 320.2*     DDimer No results for input(s): DDIMER in the last 168 hours.   Radiology    Ct Head Wo Contrast  Result Date: 03/13/2019 CLINICAL DATA:  Generalized muscle weakness. Left-sided weakness EXAM: CT HEAD WITHOUT CONTRAST TECHNIQUE: Contiguous axial images were obtained from the base of the skull through the vertex without intravenous contrast. COMPARISON:  Brain MRI 04/18/2018 FINDINGS: Brain: Small area of cytotoxic appearance in the parasagittal and posterior right frontal region, distal ACA territory. No hemorrhage, hydrocephalus, or masslike finding. Low-density in the cerebral white matter that is fairly symmetric and likely chronic microvascular ischemia but progressed from MR. Small cortically based calcification along the right hemisphere, nonspecific in isolation. Vascular: Atherosclerotic calcification. Skull: Negative. Sinuses/Orbits: Negative. Other: These results were communicated to Dr. Lorraine Lax at 6:52 amon 10/5/2020by text page via the [facility] messaging system. IMPRESSION: 1. Small acute cortical infarct in the distal right ACA territory. 2. Cerebral white matter low-density that is mild and symmetric but may have progressed from 2019, question right watershed white matter infarcts as well. Electronically Signed   By: Monte Fantasia M.D.   On: 03/13/2019 06:58   Dg Chest Port 1 View  Result Date: 03/13/2019 CLINICAL DATA:  Chest tube placement EXAM: PORTABLE CHEST 1 VIEW COMPARISON:  03/12/2019 FINDINGS: The left-sided chest tube is stable in positioning. The right-sided central venous catheter is  stable in positioning. The heart size remains significantly enlarged. The patient is status post prior median sternotomy. There are bilateral pleural effusions with adjacent atelectasis. There is no pneumothorax. There is generalized volume overload. IMPRESSION: 1. Lines and tubes as above. 2. No pneumothorax. 3. Cardiomegaly with persistent bilateral pleural effusions and adjacent atelectasis. Electronically Signed   By: Constance Holster  M.D.   On: 03/13/2019 08:02   Dg Chest Port 1 View  Result Date: 03/12/2019 CLINICAL DATA:  Status post surgery. EXAM: PORTABLE CHEST 1 VIEW COMPARISON:  03/11/2019 FINDINGS: Interval removal of the Swan-Ganz catheter. Right jugular central venous sheath remains in place. Left-sided chest tube in unchanged position. No left pneumothorax. Mild left basilar atelectasis. Right basilar airspace disease likely reflecting atelectasis with a small right pleural effusion. No right pneumothorax. Stable cardiomediastinal silhouette. Recent CABG. IMPRESSION: 1. Left-sided chest tube in unchanged position. No left pneumothorax. Mild left basilar atelectasis. 2. Right basilar atelectasis with a small right pleural effusion. Electronically Signed   By: Kathreen Devoid   On: 03/12/2019 09:11    Cardiac Studies   Echo 03/08/19: IMPRESSIONS   1. Left ventricular ejection fraction, by visual estimation, is 55 to 60%. The left ventricle has normal function. Normal left ventricular size. There is mildly increased left ventricular hypertrophy. Basal inferior akinesis.  2. Left ventricular diastolic Doppler parameters are consistent with impaired relaxation pattern of LV diastolic filling.  3. Global right ventricle has normal systolic function.The right ventricular size is normal. No increase in right ventricular wall thickness.  4. The aortic valve is tricuspid Aortic valve regurgitation is trivial by color flow Doppler. Mild aortic valve stenosis. Mean gradient 10 mmHg.  5. There is  mild dilatation of the ascending aorta measuring 42 mm.  6. Left atrial size was moderately dilated.  7. Right atrial size was mildly dilated.  8. The tricuspid valve is normal in structure. Tricuspid valve regurgitation was not visualized by color flow Doppler.  9. The mitral valve is normal in structure. No evidence of mitral valve regurgitation. No evidence of mitral stenosis. 10. TR signal is inadequate for assessing pulmonary artery systolic pressure. 11. The inferior vena cava is normal in size with greater than 50% respiratory variability, suggesting right atrial pressure of 3 mmHg.  Carotid Dopplers 03/09/2019: 1 to 39% ICA stenosis bilaterally.  Left heart cath 03/08/2019:  Severe three-vessel coronary calcification  Patent left main  Eccentric ostial 50 to 75% LAD with mid vessel 40 to 50% narrowing.  Mid to distal LAD stent is widely patent.  Ramus intermedius contains proximal 90% stenosis.  Circumflex contains 99% proximal/ostial stenosis.  RCA is heavily calcified and stented.  The artery is totally occluded in the proximal to mid segment.  Left-to-right collaterals are noted.  Mildly reduced LV systolic function with EF estimated to be 40%.  LVEDP is 28 mmHg.  Findings compatible with acute on chronic combined   Patient Profile     63 y.o. male with CAD status post LAD PCI, paroxysmal atrial fibrillation, hypertension, and OSA on CPAP admitted with NSTEMI.  Assessment & Plan    1. Acute ACA Stroke: anticoagulation on hold. Not a candidate for tPA per surgery.  2. NSTEMI s/p CABG: Left heart cath revealed severe ostial LAD, severe ostial ramus, and severe ostial left circumflex disease.  RCA was completely occluded.  He underwent CABG with Dr. Darcey Nora on 10/2.  Continue aspirin and atorvastatin.  Remains on milrinone.  Titrate beta blocker when able.  3. VT:  Seems to be quiescent. On metoprolol and amiodarone.  4. Hypertension: Home meds on hold.  Weaning midodrine  per CT surgery.  5. PAF: Went into atrial fibrillation 10/4 with rates in the 110s-120s this AM.  Now on amiodarone.  Home Eliquis on hold d/t stroke. Remains in RVR - add diltiazem.  6. Acute renal failure: Creatinine up to  2.21 today.  7. DM: On insulin.  8. Leukocytosis:  WBC declining.   9. Subclinical hypothyroidism: TSH 10.  However fT4 is within normal limits.  Continue to monitor.   TIME SPENT WITH PATIENT: 35 minutes of direct patient care. More than 50% of that time was spent on coordination of care and counseling regarding storke, NSTEMI, PAF/VT, acute renal failure.   For questions or updates, please contact [place] Please consult [email or web address] for contact info under   Pixie Casino, MD, FACC, [place] Director of the [facility] Diplomate of the American Board of Clinical Lipidology Attending Cardiologist  Direct Dial: [phone]  Fax: [phone]  Website:  [email or web address]  Pixie Casino, MD  03/13/2019, 8:43 AM

## 2019-03-14 ENCOUNTER — Inpatient Hospital Stay (HOSPITAL_COMMUNITY): Payer: Medicare Other

## 2019-03-14 ENCOUNTER — Inpatient Hospital Stay: Payer: Self-pay

## 2019-03-14 DIAGNOSIS — Z951 Presence of aortocoronary bypass graft: Secondary | ICD-10-CM

## 2019-03-14 DIAGNOSIS — E785 Hyperlipidemia, unspecified: Secondary | ICD-10-CM

## 2019-03-14 DIAGNOSIS — I2510 Atherosclerotic heart disease of native coronary artery without angina pectoris: Secondary | ICD-10-CM

## 2019-03-14 LAB — GLUCOSE, CAPILLARY
Glucose-Capillary: 121 mg/dL — ABNORMAL HIGH (ref 70–99)
Glucose-Capillary: 140 mg/dL — ABNORMAL HIGH (ref 70–99)
Glucose-Capillary: 143 mg/dL — ABNORMAL HIGH (ref 70–99)
Glucose-Capillary: 170 mg/dL — ABNORMAL HIGH (ref 70–99)

## 2019-03-14 LAB — COMPREHENSIVE METABOLIC PANEL
ALT: 23 U/L (ref 0–44)
AST: 26 U/L (ref 15–41)
Albumin: 2.4 g/dL — ABNORMAL LOW (ref 3.5–5.0)
Alkaline Phosphatase: 73 U/L (ref 38–126)
Anion gap: 12 (ref 5–15)
BUN: 35 mg/dL — ABNORMAL HIGH (ref 8–23)
CO2: 25 mmol/L (ref 22–32)
Calcium: 8.7 mg/dL — ABNORMAL LOW (ref 8.9–10.3)
Chloride: 92 mmol/L — ABNORMAL LOW (ref 98–111)
Creatinine, Ser: 1.33 mg/dL — ABNORMAL HIGH (ref 0.61–1.24)
GFR calc Af Amer: >60 mL/min (ref >60)
GFR calc non Af Amer: 56 mL/min — ABNORMAL LOW (ref >60)
Glucose, Bld: 158 mg/dL — ABNORMAL HIGH (ref 70–99)
Potassium: 3.8 mmol/L (ref 3.5–5.1)
Sodium: 129 mmol/L — ABNORMAL LOW (ref 135–145)
Total Bilirubin: 1 mg/dL (ref 0.3–1.2)
Total Protein: 5.4 g/dL — ABNORMAL LOW (ref 6.5–8.1)

## 2019-03-14 LAB — CBC
HCT: 31.9 % — ABNORMAL LOW (ref 39.0–52.0)
Hemoglobin: 11 g/dL — ABNORMAL LOW (ref 13.0–17.0)
MCH: 33.7 pg (ref 26.0–34.0)
MCHC: 34.5 g/dL (ref 30.0–36.0)
MCV: 97.9 fL (ref 80.0–100.0)
Platelets: 163 10*3/uL (ref 150–400)
RBC: 3.26 MIL/uL — ABNORMAL LOW (ref 4.22–5.81)
RDW: 13.2 % (ref 11.5–15.5)
WBC: 10.8 10*3/uL — ABNORMAL HIGH (ref 4.0–10.5)
nRBC: 0 % (ref 0.0–0.2)

## 2019-03-14 LAB — COOXEMETRY PANEL
Carboxyhemoglobin: 1.1 % (ref 0.5–1.5)
Methemoglobin: 0.7 % (ref 0.0–1.5)
O2 Saturation: 63.2 %
Total hemoglobin: 11.1 g/dL — ABNORMAL LOW (ref 12.0–16.0)

## 2019-03-14 MED ORDER — IOHEXOL 350 MG/ML SOLN
75.0000 mL | Freq: Once | INTRAVENOUS | Status: AC | PRN
  Administered 2019-03-14: 75 mL via INTRAVENOUS

## 2019-03-14 MED ORDER — SODIUM CHLORIDE 0.9% FLUSH
10.0000 mL | Freq: Two times a day (BID) | INTRAVENOUS | Status: DC
  Administered 2019-03-14 – 2019-03-22 (×12): 10 mL
  Administered 2019-03-22: 30 mL
  Administered 2019-03-23 – 2019-03-25 (×6): 10 mL
  Administered 2019-03-26 – 2019-03-27 (×2): 20 mL

## 2019-03-14 MED ORDER — ASPIRIN EC 81 MG PO TBEC
81.0000 mg | DELAYED_RELEASE_TABLET | Freq: Every day | ORAL | Status: DC
  Administered 2019-03-15 – 2019-03-27 (×13): 81 mg via ORAL
  Filled 2019-03-14 (×13): qty 1

## 2019-03-14 MED ORDER — DILTIAZEM HCL 100 MG IV SOLR
5.0000 mg/h | INTRAVENOUS | Status: DC
  Administered 2019-03-14: 7.5 mg/h via INTRAVENOUS
  Filled 2019-03-14: qty 100

## 2019-03-14 MED ORDER — SODIUM CHLORIDE 0.9% FLUSH: 10.0000 mL | INTRAVENOUS | Status: DC | PRN

## 2019-03-14 MED ORDER — HEPARIN (PORCINE) 25000 UT/250ML-% IV SOLN
1500.0000 [IU]/h | INTRAVENOUS | Status: AC
  Administered 2019-03-14: 1200 [IU]/h via INTRAVENOUS
  Administered 2019-03-15 – 2019-03-16 (×2): 1500 [IU]/h via INTRAVENOUS
  Filled 2019-03-14 (×3): qty 250

## 2019-03-14 NOTE — Evaluation (Signed)
Physical Therapy Evaluation Patient Details Name: Martin [name] MRN: [id number] DOB: [birth year] Today's Date: 03/14/2019   History of Present Illness  Pt is a 63 y/o male with PMH of  CAD, paroxysmal atrial fibrillation, OA, low back pain, polysubstance abuse, presents with chest pain and possible NSTEMI. S/p L heart cath and coronary angiography 9/30, CABG x 3 03/10/19.  Course complicated by CT revealing R distal ACA infarct 03/13/19.   Clinical Impression   Pt presents with LLE hemiplegia and LUE hemiparesis, L inattention attentive with multimodal cuing, impaired sitting balance, difficulty performing bed mobility, decreased knowledge of sternal precautions and decreased activity tolerance. Pt to benefit from acute PT to address deficits. Pt required total assist +2-3 for bed mobility this session, PT and OT reinforcing sternal precautions during mobility. PT recommending CIR to maximize functional recovery and independence with mobility. PT to progress mobility as tolerated, and will continue to follow acutely.   SpO2 of 87-92% on 3LO2 via  HR WFL BP 142/102, returned to supine.      Follow Up Recommendations CIR    Equipment Recommendations  Other (comment)(defer)    Recommendations for Other Services       Precautions / Restrictions Precautions Precautions: Fall;Sternal Precaution Comments: L hemiparesis; educated pt on "move in the tube", avoiding pushing/pulling, bracing with pillow with coughing/sneezing Restrictions Weight Bearing Restrictions: Yes Other Position/Activity Restrictions: sternal precautions       Mobility  Bed Mobility Overal bed mobility: Needs Assistance Bed Mobility: Sit to Sidelying;Rolling;Supine to Sit Rolling: Total assist;+2 for physical assistance   Supine to sit: Total assist;+2 for physical assistance;HOB elevated   Sit to sidelying: Total assist;+2 for physical assistance General bed mobility comments: total assist +2 for supine<>sit  for trunk elevation and lowering, LE lifting and translation into and out of bed. Pt able to initiate RLE movement to EOB, and able to reach RUE 25% towards L side of bed. helicopter method utilized with bed pads to maintain sternal precautions. +3 for return to supine.  Transfers                 General transfer comment: deferred due to safety and fatigue  Ambulation/Gait                Stairs            Wheelchair Mobility    Modified Rankin (Stroke Patients Only)       Balance Overall balance assessment: Needs assistance Sitting-balance support: Single extremity supported;Feet supported Sitting balance-Leahy Scale: Poor Sitting balance - Comments: posterior and L lateral leaning, requires mod assist to correct. Pt requires at least min assist to maintain upright sitting Postural control: Posterior lean;Left lateral lean Standing balance support: (nt)                                 Pertinent Vitals/Pain Pain Assessment: Faces Faces Pain Scale: Hurts little more Pain Location: chest- incisional Pain Descriptors / Indicators: Discomfort;Operative site guarding Pain Intervention(s): Limited activity within patient's tolerance;Monitored during session;Repositioned    Home Living Family/patient expects to be discharged to:: Private residence Living Arrangements: Spouse/significant other Available Help at Discharge: Family;Available 24 hours/day Type of Home: House Home Access: Other (comment)(threshold)     Home Layout: One level;Other (Comment)(den with 2 steps up, on L side ) Home Equipment: Grab bars - tub/shower;Cane - single point      Prior Function Level of Independence: Independent  Comments: working full time, driving      Journalist, newspaper   Dominant Hand: Right    Extremity/Trunk Assessment   Upper Extremity Assessment Upper Extremity Assessment: Defer to OT evaluation LUE Deficits / Details: hemiparesis, 0/5  shoulder, 2-/5 elbow, and 3-/5 hand grossly; edematous LUE Sensation: decreased light touch;decreased proprioception LUE Coordination: decreased fine motor;decreased gross motor    Lower Extremity Assessment Lower Extremity Assessment: LLE deficits/detail;Generalized weakness LLE Deficits / Details: 0/5 hip flexion/extension, hip abd/add, knee flexion/extension, DF/PF. PT able to perform full PROM DF/PF, knee flexion/extension LLE Sensation: WNL(sensation screen WFL per pt report)       Communication   Communication: No difficulties  Cognition Arousal/Alertness: Awake/alert;Lethargic(lethargic initally ) Behavior During Therapy: Flat affect Overall Cognitive Status: Impaired/Different from baseline Area of Impairment: Orientation;Attention;Following commands;Memory;Safety/judgement;Awareness;Problem solving                 Orientation Level: Disoriented to;Place;Time(reports 2021, [place] [facility] (able to correct hopsital)) Current Attention Level: Focused Memory: Decreased short-term memory;Decreased recall of precautions Following Commands: Follows one step commands inconsistently;Follows one step commands with increased time Safety/Judgement: Decreased awareness of safety;Decreased awareness of deficits Awareness: Intellectual Problem Solving: Slow processing;Decreased initiation;Difficulty sequencing;Requires verbal cues;Requires tactile cues General Comments: pt pleasant and cooperative, answers yes/no to majority of questions and spouse corrects 50% of the time with house setup; requires increased time for processing and problem solving. Pt with increased lethargy with progression of mobility, with periods of eye closing.      General Comments General comments (skin integrity, edema, etc.): pt with L inattention, able to turn head toward L but has limited visual tracking towards L. Smooth pursuits WFL with pt head in neutral    Exercises     Assessment/Plan    PT  Assessment Patient needs continued PT services  PT Problem List Decreased strength;Decreased mobility;Decreased safety awareness;Decreased activity tolerance;Decreased cognition;Decreased balance;Pain;Cardiopulmonary status limiting activity;Decreased knowledge of precautions;Obesity       PT Treatment Interventions DME instruction;Therapeutic activities;Gait training;Therapeutic exercise;Patient/family education;Balance training;Functional mobility training    PT Goals (Current goals can be found in the Care Plan section)  Acute Rehab PT Goals Patient Stated Goal: to get better PT Goal Formulation: With patient Time For Goal Achievement: 03/28/19 Potential to Achieve Goals: Good    Frequency Min 4X/week   Barriers to discharge        Co-evaluation PT/OT/SLP Co-Evaluation/Treatment: Yes Reason for Co-Treatment: Complexity of the patient's impairments (multi-system involvement);For patient/therapist safety PT goals addressed during session: Mobility/safety with mobility OT goals addressed during session: ADL's and self-care       AM-PAC PT "6 Clicks" Mobility  Outcome Measure Help needed turning from your back to your side while in a flat bed without using bedrails?: Total Help needed moving from lying on your back to sitting on the side of a flat bed without using bedrails?: Total Help needed moving to and from a bed to a chair (including a wheelchair)?: Total Help needed standing up from a chair using your arms (e.g., wheelchair or bedside chair)?: Total Help needed to walk in hospital room?: Total Help needed climbing 3-5 steps with a railing? : Total 6 Click Score: 6    End of Session Equipment Utilized During Treatment: Oxygen Activity Tolerance: Patient limited by fatigue;Patient limited by pain Patient left: in bed;with call bell/phone within reach;with bed alarm set;with family/visitor present;with nursing/sitter in room Nurse Communication: Mobility status PT Visit  Diagnosis: Muscle weakness (generalized) (M62.81);Hemiplegia and hemiparesis;Other abnormalities of gait and mobility (  R26.89) Hemiplegia - Right/Left: Left Hemiplegia - dominant/non-dominant: Non-dominant Hemiplegia - caused by: Cerebral infarction    Time: [id number] PT Time Calculation (min) (ACUTE ONLY): 32 min   Charges:   PT Evaluation $PT Eval Moderate Complexity: 1 Mod          Shyquan Stallbaumer Conception Chancy, PT Acute Rehabilitation Services Pager [phone]  Office [phone]  Nickol Collister D Vaibhav Fogleman 03/14/2019, 5:04 PM

## 2019-03-14 NOTE — Progress Notes (Signed)
4 Days Post-Op Procedure(s) (LRB): CORONARY ARTERY BYPASS GRAFTING (CABG)X 3 Using Left Internal Mammary Artery and Right Saphenous Vein Grafts (N/A) TRANSESOPHAGEAL ECHOCARDIOGRAM (TEE) (N/A) Subjective: Patient more alert with better mechanics of breathing Remains with left side weakness with left hand grip intact Repeat head CT scan pending  Renal function improved urine output improved weight still significantly up so continue daily Lasix Cardiac output- mixed venous saturation greater than 60% on low-dose milrinone Remains in atrial fibrillation rate controlled on IV amiodarone and low-dose Cardizem Holding anticoagulation for A. fib until recommendations by neurology Objective: Vital signs in last 24 hours: Temp:  [97.8 F (36.6 C)-99.2 F (37.3 C)] 98.3 F (36.8 C) (10/06 0748) Pulse Rate:  [75-111] 84 (10/06 0845) Cardiac Rhythm: Atrial fibrillation (10/06 0800) Resp:  [12-26] 13 (10/06 0845) BP: (99-153)/(54-93) 111/83 (10/06 0845) SpO2:  [89 %-99 %] 90 % (10/06 0845) Weight:  [130.6 kg] 130.6 kg (10/06 0500)  Hemodynamic parameters for last 24 hours:  Stable  Intake/Output from previous day: 10/05 0701 - 10/06 0700 In: 1385.3 [I.V.:1385.3] Out: 1835 [Urine:1835] Intake/Output this shift: Total I/O In: 108.1 [I.V.:108.1] Out: 250 [Urine:250]       Exam    General- alert and comfortable    Neck- no JVD, no cervical adenopathy palpable, no carotid bruit   Lungs- clear without rales, wheezes   Cor- regular rate and rhythm, no murmur , gallop   Abdomen- soft, non-tender   Extremities - warm, non-tender, minimal edema   Neuro- oriented, appropriate, no focal weakness   Lab Results: Recent Labs    03/13/19 0435 03/14/19 0449  WBC 12.8* 10.8*  HGB 11.0* 11.0*  HCT 32.3* 31.9*  PLT 127* 163   BMET:  Recent Labs    03/13/19 0435 03/14/19 0449  NA 129* 129*  K 3.8 3.8  CL 94* 92*  CO2 25 25  GLUCOSE 134* 158*  BUN 36* 35*  CREATININE 2.21* 1.33*   CALCIUM 8.3* 8.7*    PT/INR: No results for input(s): LABPROT, INR in the last 72 hours. ABG    Component Value Date/Time   PHART 7.410 03/11/2019 0646   HCO3 23.9 03/11/2019 0646   TCO2 25 03/11/2019 0646   ACIDBASEDEF 3.0 (H) 03/10/2019 1833   O2SAT 63.2 03/14/2019 0500   CBG (last 3)  Recent Labs    03/13/19 1629 03/13/19 2006 03/14/19 0644  GLUCAP 144* 120* 121*    Assessment/Plan: S/P Procedure(s) (LRB): CORONARY ARTERY BYPASS GRAFTING (CABG)X 3 Using Left Internal Mammary Artery and Right Saphenous Vein Grafts (N/A) TRANSESOPHAGEAL ECHOCARDIOGRAM (TEE) (N/A) Severe three-vessel coronary disease, history of chronic lung disease History of paroxysmal atrial fibrillation and postop atrial fibrillation Transient rise in creatinine postoperative-renal function remains intact Left side weakness with initial CT scan showing right posterior subcortical stroke probably from cerebral atherosclerotic vascular disease  Continue current care   LOS: 7 days    Tharon Aquas Trigt III 03/14/2019

## 2019-03-14 NOTE — Progress Notes (Signed)
[facility] Evening Rounds  POD #4 s/p CABG  Left UE/LE weakness HD stable, NSR Consider speech pathology given CVA Jettie Mannor Z. Orvan Seen, [place]

## 2019-03-14 NOTE — Progress Notes (Signed)
Dr. Prescott Gum called to check on patient status, informed him he was in sinus rhythm, not being pace, per Dr. PVT cut Cardizem to 3 and d/c if pt stays in NSR for the next hour.

## 2019-03-14 NOTE — Progress Notes (Addendum)
ANTICOAGULATION CONSULT NOTE - Initial Consult  Pharmacy Consult for heparin Indication: atrial fibrillation  Allergies  Allergen Reactions  . Penicillins Hives and Rash    Has patient had a PCN reaction causing immediate rash, facial/tongue/throat swelling, SOB or lightheadedness with hypotension: Unknown Has patient had a PCN reaction causing severe rash involving mucus membranes or skin necrosis: Unknown Has patient had a PCN reaction that required hospitalization: No Has patient had a PCN reaction occurring within the last 10 years: No If all of the above answers are "NO", then may proceed with Cephalosporin use.      Patient Measurements: Height: 5\' 8"  (172.7 cm) Weight: 287 lb 14.7 oz (130.6 kg) IBW/kg (Calculated) : 68.4 Heparin Dosing Weight: 96kg  Vital Signs: Temp: 98.5 F (36.9 C) (10/06 1300) Temp Source: Oral (10/06 1300) BP: 133/81 (10/06 1300) Pulse Rate: 79 (10/06 1300)  Labs: Recent Labs    03/12/19 0653 03/13/19 0435 03/14/19 0449  HGB 12.1* 11.0* 11.0*  HCT 35.9* 32.3* 31.9*  PLT 122* 127* 163  CREATININE 2.00* 2.21* 1.33*    Estimated Creatinine Clearance: 73.1 mL/min (A) (by C-G formula based on SCr of 1.33 mg/dL (H)).   Medical History: Past Medical History:  Diagnosis Date  . Atrial fibrillation ([place])    a. dx 10/2017. b. s/p DCCV on 11/19/2017 with successful conversion to NSR but back in AFIB 3 weeks later  . CAD (coronary artery disease)    a. s/p PCI to LAD 2001. b. s/p stenting to the RCA and mid LAD December 2011, residual distal RCA disease treated medically. b. nuc 02/2013 abnormal with fixed defect seen in inferoapical, mid-inferior, and basal inferior walls, indicative of myocardial scar, no evidence of ischemia, EF 41% (Ef 55-60% by echo same time).  . Carotid artery disease ([place])    carotid Doppler course left side 5069% stenosis  . Dysrhythmia    AFib  . Habitual alcohol use   . Hyperlipidemia   . Hypertension   . Low back  pain   . OSA on CPAP   . Osteoarthritis   . Polysubstance abuse ([place])    use including marijuana and vicodin,which he obtained from the street  . PVC's (premature ventricular contractions)    status post Holter monitor normal sinus rhythm otherwise asymptomatic PVCs.  . Seasonal allergies     Medications:  Medications Prior to Admission  Medication Sig Dispense Refill Last Dose  . allopurinol (ZYLOPRIM) 300 MG tablet Take 300 mg by mouth every morning.    03/07/2019 at Unknown time  . apixaban (ELIQUIS) 5 MG TABS tablet Take 1 tablet (5 mg total) by mouth 2 (two) times daily. 180 tablet 1 03/07/2019 at 630  . atorvastatin (LIPITOR) 80 MG tablet Take 1 tablet (80 mg total) by mouth daily. (Patient taking differently: Take 80 mg by mouth every evening. ) 90 tablet 2 03/06/2019 at Unknown time  . Cholecalciferol (VITAMIN D-3) 1000 UNITS CAPS Take 1,000 Units by mouth every morning.    03/07/2019 at Unknown time  . Cyanocobalamin (VITAMIN B-12) 2500 MCG SUBL Place 2,500 mcg under the tongue every morning.    03/07/2019 at Unknown time  . diclofenac (VOLTAREN) 75 MG EC tablet Take 75 mg by mouth 2 (two) times daily.   03/07/2019 at Unknown time  . diltiazem (CARDIZEM CD) 120 MG 24 hr capsule TAKE 1 CAPSULE BY MOUTH EVERY DAY (Patient taking differently: Take 120 mg by mouth every morning. ) 90 capsule 2 03/07/2019 at Unknown time  .  fluticasone (FLONASE) 50 MCG/ACT nasal spray Place 2 sprays into the nose daily.     03/07/2019 at Unknown time  . folic acid (FOLVITE) 1 MG tablet TAKE ONE TABLET BY MOUTH EVERY DAY (Patient taking differently: Take 1 mg by mouth every morning. ) 30 tablet 6 03/07/2019 at Unknown time  . lisinopril (PRINIVIL,ZESTRIL) 40 MG tablet TAKE 1 TABLET BY MOUTH EVERY DAY (Patient taking differently: Take 40 mg by mouth every morning. ) 90 tablet 1 03/07/2019 at Unknown time  . loratadine (CLARITIN) 10 MG tablet Take 10 mg by mouth every morning.    03/07/2019 at Unknown time  . magnesium  oxide (MAG-OX) 400 MG tablet Take 400 mg by mouth at bedtime.    03/06/2019 at Unknown time  . meclizine (ANTIVERT) 32 MG tablet Take 32 mg by mouth daily as needed for dizziness.    UNKNOWN  . nitroGLYCERIN (NITROSTAT) 0.4 MG SL tablet PLACE 1 TABLET (0.4 MG TOTAL) UNDER THE TONGUE EVERY 5 (FIVE) MINUTES AS NEEDED. (Patient taking differently: Place 0.4 mg under the tongue every 5 (five) minutes as needed for chest pain. ) 25 tablet 3 03/07/2019 at 730  . Omega-3 Fatty Acids (FISH OIL) 1200 MG CAPS Take 1,200 mg by mouth 2 (two) times daily.    03/07/2019 at Unknown time  . polyethylene glycol (MIRALAX / GLYCOLAX) packet Take 17 g by mouth 3 (three) times a week.    Past Week at Unknown time  . triamterene-hydrochlorothiazide (MAXZIDE-25) 37.5-25 MG per tablet Take 1 tablet by mouth every morning.    03/07/2019 at Unknown time    Assessment: 63 yo afib and CVA and noted with NSTEMI s/p cath with 3VCAD.  Pharmacy consulted to dose heparin (plans noted to resume apixaban in a couple days) -hg= 11, plt= 163  Goal of Therapy:  Heparin level 0.3-0.5 units/ml Monitor platelets by anticoagulation protocol: Yes   Plan:  -Begin heparin at 1200 units/hr -Heparin level in 6 hours and daily wth CBC daily   Hildred Laser, PharmD Clinical Pharmacist **Pharmacist phone directory can now be found on [email or web address] ([email or web address]).  Listed under [place].

## 2019-03-14 NOTE — Progress Notes (Signed)
Placed patient on home CPAP for the night with oxygen set at 2lpm  

## 2019-03-14 NOTE — Progress Notes (Signed)
Peripherally Inserted Central Catheter/Midline Placement  The IV Nurse has discussed with the patient and/or persons authorized to consent for the patient, the purpose of this procedure and the potential benefits and risks involved with this procedure.  The benefits include less needle sticks, lab draws from the catheter, and the patient may be discharged home with the catheter. Risks include, but not limited to, infection, bleeding, blood clot (thrombus formation), and puncture of an artery; nerve damage and irregular heartbeat and possibility to perform a PICC exchange if needed/ordered by physician.  Alternatives to this procedure were also discussed.  Bard Power PICC patient education guide, fact sheet on infection prevention and patient information card has been provided to patient /or left at bedside.    PICC/Midline Placement Documentation  PICC Double Lumen 03/14/19 PICC Right Brachial 45 cm 0 cm (Active)  Indication for Insertion or Continuance of Line Prolonged intravenous therapies 03/14/19 1200  Exposed Catheter (cm) 0 cm 03/14/19 1200  Site Assessment Clean;Dry;Intact 03/14/19 1200  Lumen #1 Status Flushed;Blood return noted 03/14/19 1200  Lumen #2 Status Flushed;Blood return noted 03/14/19 1200  Dressing Type Transparent 03/14/19 1200  Dressing Status Clean;Dry;Intact;Antimicrobial disc in place 03/14/19 1200  Dressing Change Due 03/21/19 03/14/19 1200       Jule Economy Horton 03/14/2019, 12:46 PM

## 2019-03-14 NOTE — Progress Notes (Signed)
Rehab Admissions Coordinator Note:  Per PT and OT recommendation, this patient was screened by Raechel Ache for appropriateness for an Inpatient Acute Rehab Consult.  At this time, we will follow for additional progress with therapies as pt is Total A x2 for bed mobility. Anticipate that even with IP Rehab, pt will require extensive assist at DC due to sternal precautions and left hemiparesis. Will continue to follow to determine if CIR consult warranted.      Raechel Ache 03/14/2019, 5:14 PM  I can be reached at [phone].

## 2019-03-14 NOTE — Progress Notes (Signed)
Progress Note  Patient Name: Martin [name] Date of Encounter: 03/14/2019  Primary Cardiologist: Kate Sable, MD   Subjective  O/N events: AKI resolved. Good urine output  Martin [name] was examined and evaluated at bedside this AM. He was observed resting comfortably with CPAP on. He states he has no significant complaints but was observed falling asleep during exam.  Inpatient Medications    Scheduled Meds:  acetaminophen  1,000 mg Oral Q6H   Or   acetaminophen (TYLENOL) oral liquid 160 mg/5 mL  1,000 mg Per Tube Q6H   allopurinol  300 mg Oral q morning - 10a   aspirin EC  325 mg Oral Daily   Or   aspirin  324 mg Per Tube Daily   atorvastatin  80 mg Oral q1800   bisacodyl  10 mg Oral Daily   Or   bisacodyl  10 mg Rectal Daily   Chlorhexidine Gluconate Cloth  6 each Topical Q0600   docusate sodium  200 mg Oral Daily   enoxaparin (LOVENOX) injection  40 mg Subcutaneous Q24H   fluticasone  1 spray Each Nare Daily   folic acid  1 mg Oral Daily   furosemide  80 mg Intravenous Daily   guaiFENesin  600 mg Oral BID   insulin aspart  0-24 Units Subcutaneous TID AC & HS   insulin detemir  10 Units Subcutaneous Daily   metoprolol tartrate  12.5 mg Oral BID   Or   metoprolol tartrate  12.5 mg Per Tube BID   mometasone-formoterol  2 puff Inhalation BID   mupirocin ointment  1 application Nasal BID   pantoprazole  40 mg Oral Daily   sodium chloride flush  3 mL Intravenous Q12H   thiamine  100 mg Intravenous Daily   vitamin B-12  100 mcg Oral Daily   Continuous Infusions:  sodium chloride Stopped (03/11/19 1110)   sodium chloride     sodium chloride 10 mL/hr at 03/10/19 1428   amiodarone 30 mg/hr (03/14/19 0700)   diltiazem (CARDIZEM) infusion     milrinone 0.125 mcg/kg/min (03/14/19 0136)   phenylephrine (NEO-SYNEPHRINE) Adult infusion 5 mcg/min (03/14/19 0132)   PRN Meds: sodium chloride, ALPRAZolam, ipratropium-albuterol,  metoprolol tartrate, midazolam, morphine injection, ondansetron (ZOFRAN) IV, oxyCODONE, sodium chloride flush, traMADol   Vital Signs    Vitals:   03/14/19 0530 03/14/19 0600 03/14/19 0700 03/14/19 0748  BP: 134/79 117/87 115/77   Pulse: 84 79 82   Resp: 14 14 15    Temp:    98.3 F (36.8 C)  TempSrc:    Oral  SpO2: 92% 91% 90%   Weight:      Height:        Intake/Output Summary (Last 24 hours) at 03/14/2019 0753 Last data filed at 03/14/2019 0600 Gross per 24 hour  Intake 1385.34 ml  Output 1835 ml  Net -449.66 ml   Filed Weights   03/12/19 0500 03/13/19 0500 03/14/19 0500  Weight: 122.3 kg 127.8 kg 130.6 kg    Telemetry    Irregularly irregular but no RVR - Personally Reviewed  ECG    None from this am - Personally Reviewed  Physical Exam   Gen: Well-developed, well nourished, Somnolent HEENT: NCAT head, hearing intact, EOMI, CPAP in place and functioning Neck: supple, ROM intact, no JVD CV: RRR, S1, S2 normal, No rubs, no murmurs, no gallops Pulm: CTAB, No rales, no wheezes Abd: Soft, BS+, NTND, No rebound, no guarding Extm: Peripheral pulses intact, No peripheral edema, Cool left  lower extremity Skin: Dry, Warm, normal turgor  Neuro: AAOx2, LLE strength 1/5, RLE strength 3/5, RUE strength 5/5, LUE strength 4/5  Labs    Chemistry Recent Labs  Lab 03/12/19 0653 03/12/19 0803 03/13/19 0435 03/14/19 0449  NA 132*  --  129* 129*  K 4.0  --  3.8 3.8  CL 99  --  94* 92*  CO2 24  --  25 25  GLUCOSE 181*  --  134* 158*  BUN 27*  --  36* 35*  CREATININE 2.00*  --  2.21* 1.33*  CALCIUM 8.2*  --  8.3* 8.7*  PROT 5.9* 6.0* 5.4* 5.4*  ALBUMIN 3.0* 3.1* 2.7* 2.4*  AST 33 35 26 26  ALT 25 26 22 23   ALKPHOS 50 50 53 73  BILITOT 1.2 1.7* 0.8 1.0  GFRNONAA 34*  --  30* 56*  GFRAA 39*  --  35* >60  ANIONGAP 9  --  10 12     Hematology Recent Labs  Lab 03/12/19 0653 03/13/19 0435 03/14/19 0449  WBC 15.3* 12.8* 10.8*  RBC 3.55* 3.27* 3.26*  HGB 12.1*  11.0* 11.0*  HCT 35.9* 32.3* 31.9*  MCV 101.1* 98.8 97.9  MCH 34.1* 33.6 33.7  MCHC 33.7 34.1 34.5  RDW 13.3 13.2 13.2  PLT 122* 127* 163    Cardiac EnzymesNo results for input(s): TROPONINI in the last 168 hours. No results for input(s): TROPIPOC in the last 168 hours.   BNP Recent Labs  Lab 03/07/19 2358  BNP 320.2*     DDimer No results for input(s): DDIMER in the last 168 hours.   Radiology    Ct Head Wo Contrast  Result Date: 03/13/2019 CLINICAL DATA:  Generalized muscle weakness. Left-sided weakness EXAM: CT HEAD WITHOUT CONTRAST TECHNIQUE: Contiguous axial images were obtained from the base of the skull through the vertex without intravenous contrast. COMPARISON:  Brain MRI 04/18/2018 FINDINGS: Brain: Small area of cytotoxic appearance in the parasagittal and posterior right frontal region, distal ACA territory. No hemorrhage, hydrocephalus, or masslike finding. Low-density in the cerebral white matter that is fairly symmetric and likely chronic microvascular ischemia but progressed from MR. Small cortically based calcification along the right hemisphere, nonspecific in isolation. Vascular: Atherosclerotic calcification. Skull: Negative. Sinuses/Orbits: Negative. Other: These results were communicated to Dr. Lorraine Lax at 6:52 amon 10/5/2020by text page via the [facility] messaging system. IMPRESSION: 1. Small acute cortical infarct in the distal right ACA territory. 2. Cerebral white matter low-density that is mild and symmetric but may have progressed from 2019, question right watershed white matter infarcts as well. Electronically Signed   By: Monte Fantasia M.D.   On: 03/13/2019 06:58   Dg Chest Port 1 View  Result Date: 03/13/2019 CLINICAL DATA:  Chest tube placement EXAM: PORTABLE CHEST 1 VIEW COMPARISON:  03/12/2019 FINDINGS: The left-sided chest tube is stable in positioning. The right-sided central venous catheter is stable in positioning. The heart size remains significantly  enlarged. The patient is status post prior median sternotomy. There are bilateral pleural effusions with adjacent atelectasis. There is no pneumothorax. There is generalized volume overload. IMPRESSION: 1. Lines and tubes as above. 2. No pneumothorax. 3. Cardiomegaly with persistent bilateral pleural effusions and adjacent atelectasis. Electronically Signed   By: Constance Holster M.D.   On: 03/13/2019 08:02   Vas [place] Transcranial Doppler  Result Date: 03/13/2019  Transcranial Doppler Indications: ICH. History: Post op CABG 03/10/19. Hx TIA. New onset left side weakness 03/12/19. Limitations for diagnostic windows: Unable to insonate  occipital window. Comparison Study: no prior Performing Technologist: June Leap RDMS, RVT  Examination Guidelines: A complete evaluation includes B-mode imaging, spectral Doppler, color Doppler, and power Doppler as needed of all accessible portions of each vessel. Bilateral testing is considered an integral part of a complete examination. Limited examinations for reoccurring indications may be performed as noted.  +----------+-------------+----------+-----------+--------------+  RIGHT TCD  Right VM (cm) Depth (cm) Pulsatility    Comment      +----------+-------------+----------+-----------+--------------+  MCA            63.00                   1.18                     +----------+-------------+----------+-----------+--------------+  ACA           -28.00                   0.97                     +----------+-------------+----------+-----------+--------------+  Term ICA       56.00                     1                      +----------+-------------+----------+-----------+--------------+  PCA            18.00                   1.11                     +----------+-------------+----------+-----------+--------------+  Opthalmic      22.00                   1.52                     +----------+-------------+----------+-----------+--------------+  ICA siphon     38.00                    1.17                     +----------+-------------+----------+-----------+--------------+  Vertebral                                       not visualized  +----------+-------------+----------+-----------+--------------+  +----------+------------+----------+-----------+--------------+  LEFT TCD   Left VM (cm) Depth (cm) Pulsatility    Comment      +----------+------------+----------+-----------+--------------+  MCA           67.00                   0.84                     +----------+------------+----------+-----------+--------------+  ACA           -24.00                  1.06                     +----------+------------+----------+-----------+--------------+  Term ICA      60.00                   1.08                     +----------+------------+----------+-----------+--------------+  PCA           44.00                   1.02                     +----------+------------+----------+-----------+--------------+  Opthalmic     19.00                   1.66                     +----------+------------+----------+-----------+--------------+  ICA siphon    38.00                   0.95                     +----------+------------+----------+-----------+--------------+  Vertebral                                      not visualized  +----------+------------+----------+-----------+--------------+  +------------+-------+------------------------+               VM cm/s         Comment           +------------+-------+------------------------+  Prox Basilar -14.00  Poorly visualized window  +------------+-------+------------------------+    Preliminary     Cardiac Studies    Echo 03/08/19: IMPRESSIONS  1. Left ventricular ejection fraction, by visual estimation, is 55 to 60%. The left ventricle has normal function. Normal left ventricular size. There is mildly increased left ventricular hypertrophy. Basal inferior akinesis. 2. Left ventricular diastolic Doppler parameters are consistent with impaired relaxation  pattern of LV diastolic filling. 3. Global right ventricle has normal systolic function.The right ventricular size is normal. No increase in right ventricular wall thickness. 4. The aortic valve is tricuspid Aortic valve regurgitation is trivial by color flow Doppler. Mild aortic valve stenosis. Mean gradient 10 mmHg. 5. There is mild dilatation of the ascending aorta measuring 42 mm. 6. Left atrial size was moderately dilated. 7. Right atrial size was mildly dilated. 8. The tricuspid valve is normal in structure. Tricuspid valve regurgitation was not visualized by color flow Doppler. 9. The mitral valve is normal in structure. No evidence of mitral valve regurgitation. No evidence of mitral stenosis. 10. TR signal is inadequate for assessing pulmonary artery systolic pressure. 11. The inferior vena cava is normal in size with greater than 50% respiratory variability, suggesting right atrial pressure of 3 mmHg.  Carotid Dopplers 03/09/2019: 1 to 39% ICA stenosis bilaterally.  Left heart cath 03/08/2019:  Severe three-vessel coronary calcification  Patent left main  Eccentric ostial 50 to 75% LAD with mid vessel 40 to 50% narrowing. Mid to distal LAD stent is widely patent.  Ramus intermedius contains proximal 90% stenosis.  Circumflex contains 99% proximal/ostial stenosis.  RCA is heavily calcified and stented. The artery is totally occluded in the proximal to mid segment. Left-to-right collaterals are noted.  Mildly reduced LV systolic function with EF estimated to be 40%. LVEDP is 28 mmHg. Findings compatible with acute on chronic combined   Patient Profile     Martin [name] is a 63 yo M w/ PMH of prior CAD s/p pCI, PAF, HTN, OSA on CPAP presented with chest pain, found to have NSTEMI w/ multivessel dz requiring CABG. Hospital course complicated by acute ACA stroke.  Assessment & Plan    Acute  ACA stroke Found with left sided weakness. Not candidate for MRI due to  leads. CT head showing distal right ACA - Appreciate neuro recs: repeat CT head - Resume anticoagulation when cleared by neuro  NSTEMI Multi-vessel CAD Cath with multi-vessel dz. S/p CABG post op day 4. No significant chest pain per pt. Post-op Echo w/ no significant different. Some pitting edema on exam. - C/w asa, atorvastatin - Start DAPT when cleared by CT surgery.  PAF Developed A.fib w/ RVR couple days ago. On dilt + amio. Still irregular but no longer RVR this am. - C/w dilt drip, amio - Resume anticoagulation once CT head is done  For questions or updates, please contact [place] Please consult [email or web address] for contact info under Cardiology/STEMI.   Signed, Gilberto Better, MD PGY-2, [facility] Pager: [phone] 03/14/2019, 7:53 AM    Attending attestation to follow

## 2019-03-14 NOTE — Progress Notes (Signed)
STROKE TEAM PROGRESS NOTE   SUBJECTIVE (INTERVAL HISTORY) Pt lying in bed, seems less lethargic than yesterday. Still has left LE plegic and left UE not against gravity. However, sensory neglect resolved. CT repeat showed small right ACA infarct, no large stroke or bleeding. Will start heparin IV   OBJECTIVE Temp:  [97.8 F (36.6 C)-99.2 F (37.3 C)] 98.3 F (36.8 C) (10/06 0748) Pulse Rate:  [75-111] 84 (10/06 0845) Cardiac Rhythm: Atrial fibrillation (10/06 0800) Resp:  [12-26] 13 (10/06 0845) BP: (104-153)/(54-93) 111/83 (10/06 0845) SpO2:  [89 %-99 %] 90 % (10/06 0845) Weight:  [130.6 kg] 130.6 kg (10/06 0500)  Recent Labs  Lab 03/13/19 0842 03/13/19 1248 03/13/19 1629 03/13/19 2006 03/14/19 0644  GLUCAP 132* 142* 144* 120* 121*   Recent Labs  Lab 03/10/19 1857 03/11/19 0409  03/11/19 1712 03/12/19 0335 03/12/19 0653 03/13/19 0435 03/14/19 0449  NA 137 134*   < > 134* 133* 132* 129* 129*  K 3.8 3.9   < > 4.2 4.1 4.0 3.8 3.8  CL 107 105  --  101 98 99 94* 92*  CO2 22 21*  --  23 24 24 25 25   GLUCOSE 132* 134*  --  174* 153* 181* 134* 158*  BUN 14 14  --  20 27* 27* 36* 35*  CREATININE 0.75 0.88  --  1.42* 1.81* 2.00* 2.21* 1.33*  CALCIUM 7.7* 7.7*  --  8.3* 8.2* 8.2* 8.3* 8.7*  MG 2.7* 2.2  --  2.1  --  2.1  --   --    < > = values in this interval not displayed.   Recent Labs  Lab 03/09/19 0430 03/12/19 0653 03/12/19 0803 03/13/19 0435 03/14/19 0449  AST 25 33 35 26 26  ALT 28 25 26 22 23   ALKPHOS 56 50 50 53 73  BILITOT 1.1 1.2 1.7* 0.8 1.0  PROT 6.7 5.9* 6.0* 5.4* 5.4*  ALBUMIN 3.9 3.0* 3.1* 2.7* 2.4*   Recent Labs  Lab 03/11/19 1712 03/12/19 0335 03/12/19 0653 03/13/19 0435 03/14/19 0449  WBC 17.7* 14.5* 15.3* 12.8* 10.8*  HGB 13.1 11.8* 12.1* 11.0* 11.0*  HCT 40.2 35.4* 35.9* 32.3* 31.9*  MCV 102.3* 100.9* 101.1* 98.8 97.9  PLT 158 130* 122* 127* 163   No results for input(s): CKTOTAL, CKMB, CKMBINDEX, TROPONINI in the last 168  hours. No results for input(s): LABPROT, INR in the last 72 hours. Recent Labs    03/11/19 1712  COLORURINE AMBER*  LABSPEC 1.023  PHURINE 5.0  GLUCOSEU NEGATIVE  HGBUR LARGE*  BILIRUBINUR NEGATIVE  KETONESUR NEGATIVE  PROTEINUR 30*  NITRITE NEGATIVE  LEUKOCYTESUR TRACE*       Component Value Date/Time   CHOL 170 03/07/2019 2358   TRIG 218 (H) 03/07/2019 2358   HDL 41 03/07/2019 2358   CHOLHDL 4.1 03/07/2019 2358   VLDL 44 (H) 03/07/2019 2358   LDLCALC 85 03/07/2019 2358   Lab Results  Component Value Date   HGBA1C 5.6 03/09/2019      Component Value Date/Time   LABOPIA NONE DETECTED 05/20/2010 1326   COCAINSCRNUR NONE DETECTED 05/20/2010 1326   LABBENZ POSITIVE (A) 05/20/2010 1326   AMPHETMU NONE DETECTED 05/20/2010 1326   THCU NONE DETECTED 05/20/2010 1326   LABBARB  05/20/2010 1326    NONE DETECTED        DRUG SCREEN FOR MEDICAL PURPOSES ONLY.  IF CONFIRMATION IS NEEDED FOR ANY PURPOSE, NOTIFY LAB WITHIN 5 DAYS.        LOWEST DETECTABLE  LIMITS FOR URINE DRUG SCREEN Drug Class       Cutoff (ng/mL) Amphetamine      1000 Barbiturate      200 Benzodiazepine   [id number] Tricyclics       [id number] Opiates          300 Cocaine          300 THC              50    No results for input(s): ETH in the last 168 hours.  I have personally reviewed the radiological images below and agree with the radiology interpretations.  Dg Chest 1 View  Result Date: 03/10/2019 CLINICAL DATA:  Status post CABG, rule out pneumo. EXAM: CHEST  1 VIEW COMPARISON:  03/07/2019 FINDINGS: Now status post median sternotomy and CABG. Swan-Ganz catheter in proximal right pulmonary artery. A transesophageal echocardiographic pro passes to the level of the T10 vertebral body. Seven intact sternal wires are noted. Left-sided chest tube in place without signs of pneumothorax on supine radiograph. Cardiomediastinal contours with mild widening superiorly likely secondary to postoperative change otherwise  unchanged accounting for low lung volumes. There is a single tube in the midline projecting over sternal wires extending to the level of the sternal manubrium. This may represent mediastinal drain. IMPRESSION: 1. Status post CABG with left chest tube without visible pneumothorax. 2. Swan-Ganz catheter and transesophageal echocardiographic probe in place. 3. Midline drainage tubing projects over the mediastinum, potentially mediastinal drain, clinical correlation suggested. Electronically Signed   By: Zetta Bills M.D.   On: 03/10/2019 13:30   Dg Chest 2 View  Result Date: 03/07/2019 CLINICAL DATA:  Chest pain for 2 weeks. EXAM: CHEST - 2 VIEW COMPARISON:  10/28/2017 and prior radiographs FINDINGS: Cardiomegaly and LEFT basilar scarring again noted. There is no evidence of focal airspace disease, pulmonary edema, suspicious pulmonary nodule/mass, pleural effusion, or pneumothorax. No acute bony abnormalities are identified. IMPRESSION: Cardiomegaly without evidence of acute cardiopulmonary disease. Electronically Signed   By: Margarette Canada M.D.   On: 03/07/2019 14:30   Ct Head Wo Contrast  Result Date: 03/13/2019 CLINICAL DATA:  Generalized muscle weakness. Left-sided weakness EXAM: CT HEAD WITHOUT CONTRAST TECHNIQUE: Contiguous axial images were obtained from the base of the skull through the vertex without intravenous contrast. COMPARISON:  Brain MRI 04/18/2018 FINDINGS: Brain: Small area of cytotoxic appearance in the parasagittal and posterior right frontal region, distal ACA territory. No hemorrhage, hydrocephalus, or masslike finding. Low-density in the cerebral white matter that is fairly symmetric and likely chronic microvascular ischemia but progressed from MR. Small cortically based calcification along the right hemisphere, nonspecific in isolation. Vascular: Atherosclerotic calcification. Skull: Negative. Sinuses/Orbits: Negative. Other: These results were communicated to Dr. Lorraine Lax at 6:52 amon  10/5/2020by text page via the [facility] messaging system. IMPRESSION: 1. Small acute cortical infarct in the distal right ACA territory. 2. Cerebral white matter low-density that is mild and symmetric but may have progressed from 2019, question right watershed white matter infarcts as well. Electronically Signed   By: Monte Fantasia M.D.   On: 03/13/2019 06:58   Dg Chest Port 1 View  Result Date: 03/14/2019 CLINICAL DATA:  Short of breath.  Status post CABG surgery. EXAM: PORTABLE CHEST 1 VIEW COMPARISON:  03/13/2019 and earlier exams. FINDINGS: Left chest tube has been removed. There is opacity corresponding to the chest tube tract consistent with fluid/atelectasis. No pneumothorax. Persistent opacity noted at the bases consistent with a combination of pleural effusions and atelectasis. No  convincing pulmonary edema. No mediastinal widening.  Cardiac silhouette is enlarged but stable. Stable right internal jugular Cordis. IMPRESSION: 1. Status post removal of the left chest tube.  No pneumothorax. 2. Persistent lung base opacity consistent with a combination of pleural effusions and atelectasis. Electronically Signed   By: Lajean Manes M.D.   On: 03/14/2019 08:23   Dg Chest Port 1 View  Result Date: 03/13/2019 CLINICAL DATA:  Chest tube placement EXAM: PORTABLE CHEST 1 VIEW COMPARISON:  03/12/2019 FINDINGS: The left-sided chest tube is stable in positioning. The right-sided central venous catheter is stable in positioning. The heart size remains significantly enlarged. The patient is status post prior median sternotomy. There are bilateral pleural effusions with adjacent atelectasis. There is no pneumothorax. There is generalized volume overload. IMPRESSION: 1. Lines and tubes as above. 2. No pneumothorax. 3. Cardiomegaly with persistent bilateral pleural effusions and adjacent atelectasis. Electronically Signed   By: Constance Holster M.D.   On: 03/13/2019 08:02   Dg Chest Port 1 View  Result Date:  03/12/2019 CLINICAL DATA:  Status post surgery. EXAM: PORTABLE CHEST 1 VIEW COMPARISON:  03/11/2019 FINDINGS: Interval removal of the Swan-Ganz catheter. Right jugular central venous sheath remains in place. Left-sided chest tube in unchanged position. No left pneumothorax. Mild left basilar atelectasis. Right basilar airspace disease likely reflecting atelectasis with a small right pleural effusion. No right pneumothorax. Stable cardiomediastinal silhouette. Recent CABG. IMPRESSION: 1. Left-sided chest tube in unchanged position. No left pneumothorax. Mild left basilar atelectasis. 2. Right basilar atelectasis with a small right pleural effusion. Electronically Signed   By: Kathreen Devoid   On: 03/12/2019 09:11   Dg Chest Port 1 View  Result Date: 03/11/2019 CLINICAL DATA:  CABG x3 03/10/2019. EXAM: PORTABLE CHEST 1 VIEW COMPARISON:  03/10/2019 FINDINGS: Right IJ Swan-Ganz catheter unchanged with tip over the right main pulmonary artery. Left-sided chest tube unchanged. Mediastinal drain unchanged. Lungs are adequately inflated with very minimal bibasilar density likely small amount of bilateral pleural fluid/atelectasis. Mild stable cardiomegaly. Remainder of the exam is unchanged. IMPRESSION: Suggestion of small amount of bilateral pleural fluid/atelectasis. Stable cardiomegaly. Tubes and lines as described. Electronically Signed   By: Marin Olp M.D.   On: 03/11/2019 08:46   Dg Chest Port 1 View  Result Date: 03/10/2019 CLINICAL DATA:  S/p CABG x 3, chest tube in place, pt intubated. EXAM: PORTABLE CHEST 1 VIEW COMPARISON:  Chest radiograph 03/10/2019 at 1:09 p.m. FINDINGS: Stable enlarged cardiomediastinal contours status post median sternotomy. Interval intubation with endotracheal tube tip projecting between the thoracic inlet and carina. Stable remaining support apparatus including a PA catheter, enteric tube and left chest tube. The lungs are well aerated. No new focal pulmonary opacity. No  pneumothorax or large pleural effusion. IMPRESSION: 1. Interval intubation with endotracheal tube tip projecting between the thoracic inlet and carina. 2. Postoperative appearance of the chest. Electronically Signed   By: Audie Pinto M.D.   On: 03/10/2019 15:08   Vas US Doppler Pre Cabg  Result Date: 03/09/2019 PREOPERATIVE VASCULAR EVALUATION  Indications:      Pre CABG evaluation. Risk Factors:     Hypertension, hyperlipidemia, coronary artery disease. Other Factors:    Atrial fibrillation. Comparison Study: Prior study done 05/23/16 is available for comparison Performing Technologist: Birdena Crandall, [place] Technologist: Sharion Dove RVS  Examination Guidelines: A complete evaluation includes B-mode imaging, spectral Doppler, color Doppler, and power Doppler as needed of all accessible portions of each vessel. Bilateral testing is considered an integral part  of a complete examination. Limited examinations for reoccurring indications may be performed as noted.  Right Carotid Findings: +----------+--------+--------+--------+--------+------------------+           PSV cm/sEDV cm/sStenosisDescribeComments           +----------+--------+--------+--------+--------+------------------+ CCA Prox  46      6               calcific                   +----------+--------+--------+--------+--------+------------------+ CCA Distal42      7                       intimal thickening +----------+--------+--------+--------+--------+------------------+ ICA Prox  46      14              calcificShadowing          +----------+--------+--------+--------+--------+------------------+ ICA Distal52      11                                         +----------+--------+--------+--------+--------+------------------+ ECA       108     11                                         +----------+--------+--------+--------+--------+------------------+ Portions of this table do not appear on  this page. +----------+--------+-------+--------+------------+           PSV cm/sEDV cmsDescribeArm Pressure +----------+--------+-------+--------+------------+ Subclavian161                    145          +----------+--------+-------+--------+------------+ +---------+--------+--+--------+--+ VertebralPSV cm/s34EDV cm/s11 +---------+--------+--+--------+--+ Left Carotid Findings: +----------+--------+--------+--------+------------+------------------+           PSV cm/sEDV cm/sStenosisDescribe    Comments           +----------+--------+--------+--------+------------+------------------+ CCA Prox  55      13              homogeneous                    +----------+--------+--------+--------+------------+------------------+ CCA Distal57      12                          intimal thickening +----------+--------+--------+--------+------------+------------------+ ICA Prox  66      22              heterogenous                   +----------+--------+--------+--------+------------+------------------+ ICA Distal77      21                          tortuous           +----------+--------+--------+--------+------------+------------------+ ECA       184     19                                             +----------+--------+--------+--------+------------+------------------+ +----------+--------+--------+--------+------------+ SubclavianPSV cm/sEDV cm/sDescribeArm Pressure +----------+--------+--------+--------+------------+           220  145          +----------+--------+--------+--------+------------+ +---------+--------+--+--------+-+ VertebralPSV cm/s33EDV cm/s7 +---------+--------+--+--------+-+  ABI Findings: +---------+------------------+-----+----------+--------+ Right    Rt Pressure (mmHg)IndexWaveform  Comment  +---------+------------------+-----+----------+--------+ Brachial 145                    triphasic           +---------+------------------+-----+----------+--------+ PTA      104               0.72 monophasic         +---------+------------------+-----+----------+--------+ DP       117               0.81 monophasic         +---------+------------------+-----+----------+--------+ Great Toe76                0.52                    +---------+------------------+-----+----------+--------+ +---------+------------------+-----+---------+-------+ Left     Lt Pressure (mmHg)IndexWaveform Comment +---------+------------------+-----+---------+-------+ Brachial 145                    triphasic        +---------+------------------+-----+---------+-------+ PTA      186               1.28 biphasic         +---------+------------------+-----+---------+-------+ DP       175               1.21 biphasic         +---------+------------------+-----+---------+-------+ Great Toe118               0.81                  +---------+------------------+-----+---------+-------+ +-------+---------------+----------------+ ABI/TBIToday's ABI/TBIPrevious ABI/TBI +-------+---------------+----------------+ Right  0.81/0.52      No previous      +-------+---------------+----------------+ Left   1.28/0.81      No previous      +-------+---------------+----------------+  Right Doppler Findings: +-----------+--------+-----+---------+--------+ Site       PressureIndexDoppler  Comments +-----------+--------+-----+---------+--------+ Brachial   145          triphasic         +-----------+--------+-----+---------+--------+ Radial                  triphasic         +-----------+--------+-----+---------+--------+ Ulnar                   triphasic         +-----------+--------+-----+---------+--------+ Palmar Arch                      normal   +-----------+--------+-----+---------+--------+  Left Doppler Findings: +-----------+--------+-----+---------+--------+ Site        PressureIndexDoppler  Comments +-----------+--------+-----+---------+--------+ Brachial   145          triphasic         +-----------+--------+-----+---------+--------+ Radial                  triphasic         +-----------+--------+-----+---------+--------+ Ulnar                   triphasic         +-----------+--------+-----+---------+--------+ Palmar Arch                      normal   +-----------+--------+-----+---------+--------+  Summary: Right Carotid: Velocities in the  right ICA are consistent with a 1-39% stenosis.                No change since study done 05/23/16. Left Carotid: Velocities in the left ICA are consistent with a 1-39% stenosis.               No change since study done 05/23/16. Vertebrals:  Bilateral vertebral arteries demonstrate antegrade flow. Subclavians: Normal flow hemodynamics were seen in bilateral subclavian              arteries. Right ABI: Resting right ankle-brachial index indicates mild right lower extremity arterial disease. The right toe-brachial index is abnormal. Left ABI: Resting left ankle-brachial index is within normal range. No evidence of significant left lower extremity arterial disease. The left toe-brachial index is normal. Right Upper Extremity: Doppler waveforms remain within normal limits with right radial compression. Doppler waveforms remain within normal limits with right ulnar compression. Left Upper Extremity: Doppler waveforms remain within normal limits with left radial compression. Doppler waveforms remain within normal limits with left ulnar compression.  Electronically signed by Servando Snare MD on 03/09/2019 at 5:45:33 PM.    Final    Vas [place] Transcranial Doppler  Result Date: 03/14/2019  Transcranial Doppler Indications: ICH. History: Post op CABG 03/10/19. Hx TIA. New onset left side weakness 03/12/19. Limitations for diagnostic windows: Unable to insonate occipital window. Comparison Study: no prior Performing  Technologist: June Leap RDMS, RVT  Examination Guidelines: A complete evaluation includes B-mode imaging, spectral Doppler, color Doppler, and power Doppler as needed of all accessible portions of each vessel. Bilateral testing is considered an integral part of a complete examination. Limited examinations for reoccurring indications may be performed as noted.  +----------+-------------+----------+-----------+--------------+ RIGHT TCD Right VM (cm)Depth (cm)Pulsatility   Comment     +----------+-------------+----------+-----------+--------------+ MCA           63.00                 1.18                   +----------+-------------+----------+-----------+--------------+ ACA          -28.00                 0.97                   +----------+-------------+----------+-----------+--------------+ Term ICA      56.00                   1                    +----------+-------------+----------+-----------+--------------+ PCA           18.00                 1.11                   +----------+-------------+----------+-----------+--------------+ Opthalmic     22.00                 1.52                   +----------+-------------+----------+-----------+--------------+ ICA siphon    38.00                 1.17                   +----------+-------------+----------+-----------+--------------+ Vertebral  not visualized +----------+-------------+----------+-----------+--------------+  +----------+------------+----------+-----------+--------------+ LEFT TCD  Left VM (cm)Depth (cm)Pulsatility   Comment     +----------+------------+----------+-----------+--------------+ MCA          67.00                 0.84                   +----------+------------+----------+-----------+--------------+ ACA          -24.00                1.06                   +----------+------------+----------+-----------+--------------+ Term ICA     60.00                  1.08                   +----------+------------+----------+-----------+--------------+ PCA          44.00                 1.02                   +----------+------------+----------+-----------+--------------+ Opthalmic    19.00                 1.66                   +----------+------------+----------+-----------+--------------+ ICA siphon   38.00                 0.95                   +----------+------------+----------+-----------+--------------+ Vertebral                                  not visualized +----------+------------+----------+-----------+--------------+  +------------+-------+------------------------+             VM cm/s        Comment          +------------+-------+------------------------+ Prox Basilar-14.00 Poorly visualized window +------------+-------+------------------------+ Summary:  Normalmean flow velocities in identified vessels of anterior cerebral cirn. Suboptimal occipital window limits evaluation of posterior circulation vessels. *See table(s) above for measurements and observations.  Diagnosing physician: Antony Contras MD Electronically signed by Antony Contras MD on 03/14/2019 at 8:07:57 AM.    Final    [place] Ekg Site Rite  Result Date: 03/14/2019 If Site Rite image not attached, placement could not be confirmed due to current cardiac rhythm.   PHYSICAL EXAM    Temp:  [97.8 F (36.6 C)-99.2 F (37.3 C)] 98.3 F (36.8 C) (10/06 0748) Pulse Rate:  [75-111] 84 (10/06 0845) Resp:  [12-26] 13 (10/06 0845) BP: (104-153)/(54-93) 111/83 (10/06 0845) SpO2:  [89 %-99 %] 90 % (10/06 0845) Weight:  [130.6 kg] 130.6 kg (10/06 0500)  General - Well nourished, well developed, in no apparent distress.  Ophthalmologic - fundi not visualized due to noncooperation.  Cardiovascular - regular heart rate and rhythm today, not in afib  Mental Status -  Level of arousal and orientation to time, place, and person were intact. Language including  expression, naming, repetition, comprehension was assessed and found intact. Fund of Knowledge was assessed and was intact.  Cranial Nerves II - XII - II - Visual field intact OU. III, IV, VI - Extraocular movements intact. V - Facial sensation intact bilaterally. VII - Facial movement intact bilaterally. VIII - Hearing &  vestibular intact bilaterally. X - Palate elevates symmetrically. XI - Chin turning & shoulder shrug intact bilaterally. XII - Tongue protrusion intact.  Motor Strength - The patient's strength was normal in RUE and RLE, however, left UE proximal 3-/5 and distal finger grip 4/5. LLE plegic. Bulk was normal and fasciculations were absent.   Motor Tone - Muscle tone was assessed at the neck and appendages and was normal.  Reflexes - The patient's reflexes were symmetrical in all extremities except diminished LLE and he had no pathological reflexes.  Sensory - Light touch, temperature/pinprick were assessed and were symmetrical. Sensory neglect resolved on LLE today.    Coordination - The patient had normal movements in right hand with no ataxia or dysmetria.  Tremor was absent.  Gait and Station - deferred.   ASSESSMENT/PLAN Martin [name] is a 63 y.o. male with history of A. fib on Eliquis, CAD, hypertension, hyperlipidemia, OSA on CPAP admitted for unstable angina, status post CABG 10/2.  Found to have left lower extremity weakness yesterday afternoon but worsening this morning with left lower extremity plegic.  No tPA given due to outside window and recent surgery.    Stroke:  right posterior ACA distal infarct, etiology uncertain, could be A. fib while holding on AC versus intracranial atherosclerosis in the setting of low BP  Resultant left arm weakness and left leg plegic  MRI not able to perform due to recent CABG procedure  CT head showed right distal ACA infarct  CTA head and neck pending  Repeat CT 10/6 stable small right ACA infarct  Carotid  Doppler 10/1 bilateral ICA 1 to 39% stenosis  TEE 10/2 EF 55 to 60%, no PFO  TCD showed no proximal intracranial large vessel stenosis  LDL 85  HgbA1c 5.6  Lovenox for VTE prophylaxis  Eliquis (apixaban) daily prior to admission, now on aspirin 81 mg daily.  AC on hold so far for recent CABG. Will start heparin IV per stroke protocol for 2 days, if tolerating well, can transition back to Eliquis.  Therapy recommendations: Pending  Disposition: Pending  Hx of hypertension Hypotension  BP low at 70s-90s on 10/4 from midnight to morning, now much improved  Pt on cardizem drip, milrinone drip, metoprolol po which all have impact on low BP  Currently BP 110-120s  Avoid low BP  BP goal 110-140 to maintain cerebral perfusion  afib RVR  Cardiology on board  On amiodarone, cardizem and metoprolol  eliquis on hold for now  Now on heparin IV per stroke protocol for 2 days, if tolerating well, can transition back to Eliquis.  Unstable angina / CAD s/p CABG  CABG 10/2  CTS and cardiology on board  On ASA 81 and lipitor 80  AKI   Cre 0.88->2.21->1.33   Will do CTA head and neck  On IVF  Manage per primary team  Hyperlipidemia  Home meds:  lipitor   LDL 85, goal < 70  Now on lipitor 80  Continue statin at discharge  Other Stroke Risk Factors  Advanced age  Morbid Obesity, Body mass index is 43.78 kg/m.   Hx stroke/TIA  Obstructive sleep apnea, on CPAP at home  Other Active Problems  Hyponatremia - Na 129->129    Leukocytosis WBC 12.8->10.8    Thrombocytopenia, resolved - Platelet 127->163   Hospital day # 7  I spent  35 minutes in total face-to-face time with the patient, more than 50% of which was spent in counseling and coordination of care,  reviewing test results, images and medication, and discussing the diagnosis of stroke, afib, s/p CABG, hypotension, treatment plan and potential prognosis. This patient's care requiresreview of multiple  databases, neurological assessment, discussion with family, other specialists and medical decision making of high complexity.  Rosalin Hawking, MD PhD Stroke Neurology 03/14/2019 10:47 AM    To contact Stroke Continuity provider, please refer to [email or web address] After hours, contact General Neurology

## 2019-03-14 NOTE — Evaluation (Signed)
Occupational Therapy Evaluation Patient Details Name: Martin [name] MRN: [id number] DOB: 26-Nov-1955 Today's Date: 03/14/2019    History of Present Illness Pt is a 63 y/o male with PMH of  CAD, paroxysmal atrial fibrillation, OA, low back pain, polysubstance abuse, presents with chest pain and possible NSTEMI. S/p L heart cath and coronary angiography 9/30, CABG x 3 03/10/19.  Course complicated by CT revealing R distal ACA infarct 03/13/19.    Clinical Impression   PTA patient independent and working. Admitted for above and limited by problem list below, including L hemiparesis, impaired cognition, decreased activity tolerance/generalized weakness, and impaired balance. Patient oriented to self, but not time (year reports 2021) and place (initally reports [facility], but able to correct); follows 1 step commands inconsistently with increased time, and demonstrates poor short term memory, attention and awareness. He has preference to R gaze, L inattention; able to demonstrate distal movement of UE--gross hand flexion/extension but movement fades proximally.  Educated spouse/patient on postioning of L UE and providing increased input to L side. He currently requires total assist +2 for bed mobility and sitting EOB with L lateral and posterior lean, max-total assist for ADLs.   Will follow acutely, and believe he will best benefit from continued OT services at intensive CIR level in order to maximize independence with ADLs/mobility.     Follow Up Recommendations  CIR;Supervision/Assistance - 24 hour    Equipment Recommendations  Other (comment)(TBD at next venue of care)    Recommendations for Other Services Rehab consult;Speech consult     Precautions / Restrictions Precautions Precautions: Fall Precaution Comments: L hemiparesis  Restrictions Weight Bearing Restrictions: Yes Other Position/Activity Restrictions: sternal precautions       Mobility Bed Mobility Overal bed  mobility: Needs Assistance Bed Mobility: Sit to Sidelying;Rolling;Supine to Sit Rolling: Total assist;+2 for physical assistance   Supine to sit: Total assist;+2 for physical assistance;HOB elevated   Sit to sidelying: Total assist;+2 for physical assistance General bed mobility comments: patient transitioned to EOB with modified helicopter technique (able to minimally initate movement of R LE and R UE), returned to sidelying/supine with +3 assist (RN assisting)  Transfers                 General transfer comment: deferred due to safety and fatigue    Balance Overall balance assessment: Needs assistance Sitting-balance support: Single extremity supported;Feet supported Sitting balance-Leahy Scale: Poor Sitting balance - Comments: pt with L lateral and posterior lean, able to correct with multimodal cueing and support  Postural control: Posterior lean;Left lateral lean                                 ADL either performed or assessed with clinical judgement   ADL Overall ADL's : Needs assistance/impaired     Grooming: Maximal assistance;Sitting   Upper Body Bathing: Maximal assistance;Sitting   Lower Body Bathing: Maximal assistance;+2 for physical assistance;Bed level   Upper Body Dressing : Total assistance;Sitting   Lower Body Dressing: Total assistance;+2 for physical assistance;Bed level     Toilet Transfer Details (indicate cue type and reason): deferred         Functional mobility during ADLs: Total assistance;+2 for physical assistance;+2 for safety/equipment General ADL Comments: limited to EOB only, pt limited by pain, L sided hemiparesis, decreased activity tolerance, impaired balance and cognition      Vision Baseline Vision/History: Wears glasses Wears Glasses: Distance only Patient  Visual Report: No change from baseline Vision Assessment?: Yes Eye Alignment: Within Functional Limits Ocular Range of Motion: Within Functional  Limits Alignment/Gaze Preference: Gaze right Tracking/Visual Pursuits: Able to track stimulus in all quads without difficulty Additional Comments: further assessment, decreased scan towards L side but able to with increased cueing and focus; R gaze preference     Perception Perception Perception Tested?: Yes Perception Deficits: Inattention/neglect Inattention/Neglect: Does not attend to left visual field;Does not attend to left side of body   Praxis      Pertinent Vitals/Pain Pain Assessment: Faces Faces Pain Scale: Hurts little more Pain Location: chest- incisional Pain Descriptors / Indicators: Discomfort;Operative site guarding Pain Intervention(s): Monitored during session;Repositioned;Limited activity within patient's tolerance     Hand Dominance Right   Extremity/Trunk Assessment Upper Extremity Assessment Upper Extremity Assessment: Generalized weakness;LUE deficits/detail LUE Deficits / Details: hemiparesis, 0/5 shoulder, 2-/5 elbow, and 3-/5 hand grossly; edematous LUE Sensation: decreased light touch;decreased proprioception LUE Coordination: decreased fine motor;decreased gross motor   Lower Extremity Assessment Lower Extremity Assessment: Defer to PT evaluation       Communication Communication Communication: No difficulties   Cognition Arousal/Alertness: Awake/alert;Lethargic(lethargic initally ) Behavior During Therapy: Flat affect Overall Cognitive Status: Impaired/Different from baseline Area of Impairment: Orientation;Attention;Following commands;Memory;Safety/judgement;Awareness;Problem solving                 Orientation Level: Disoriented to;Place;Time(reports 2021, [facility] (able to correct hopsital)) Current Attention Level: Focused Memory: Decreased short-term memory;Decreased recall of precautions Following Commands: Follows one step commands inconsistently;Follows one step commands with increased time Safety/Judgement:  Decreased awareness of safety;Decreased awareness of deficits Awareness: Intellectual Problem Solving: Slow processing;Decreased initiation;Difficulty sequencing;Requires verbal cues;Requires tactile cues General Comments: pt pleasant and cooperative, answers yes/no to majority of questions and spouse corrects 50% of the time with house setup; requires increased time for processing and problem sovling     General Comments  spouse present and supportive; education on positioning of L UE for shoudler protection and edema reduction     Exercises     Shoulder Instructions      Home Living Family/patient expects to be discharged to:: Private residence Living Arrangements: Spouse/significant other Available Help at Discharge: Family;Available 24 hours/day Type of Home: House Home Access: Other (comment)(threshold)     Home Layout: One level;Other (Comment)(den with 2 steps up, on L side )     Bathroom Shower/Tub: Occupational psychologist: Standard     Home Equipment: Grab bars - tub/shower;Cane - single point          Prior Functioning/Environment Level of Independence: Independent        Comments: working full time, driving         OT Problem List: Decreased strength;Decreased activity tolerance;Impaired balance (sitting and/or standing);Impaired vision/perception;Decreased range of motion;Decreased cognition;Decreased coordination;Decreased safety awareness;Cardiopulmonary status limiting activity;Decreased knowledge of precautions;Decreased knowledge of use of DME or AE;Impaired sensation;Impaired tone;Pain;Increased edema;Impaired UE functional use;Obesity      OT Treatment/Interventions: Self-care/ADL training;Neuromuscular education;DME and/or AE instruction;Cognitive remediation/compensation;Therapeutic activities;Patient/family education;Balance training;Visual/perceptual remediation/compensation;Splinting    OT Goals(Current goals can be found in the care  plan section) Acute Rehab OT Goals Patient Stated Goal: to get better OT Goal Formulation: With patient Time For Goal Achievement: 03/28/19 Potential to Achieve Goals: Good  OT Frequency: Min 3X/week   Barriers to D/C:            Co-evaluation PT/OT/SLP Co-Evaluation/Treatment: Yes Reason for Co-Treatment: Complexity of the patient's impairments (multi-system involvement);For patient/therapist safety PT  goals addressed during session: Mobility/safety with mobility OT goals addressed during session: ADL's and self-care      AM-PAC OT "6 Clicks" Daily Activity     Outcome Measure Help from another person eating meals?: Total Help from another person taking care of personal grooming?: A Lot Help from another person toileting, which includes using toliet, bedpan, or urinal?: Total Help from another person bathing (including washing, rinsing, drying)?: Total Help from another person to put on and taking off regular upper body clothing?: Total Help from another person to put on and taking off regular lower body clothing?: Total 6 Click Score: 7   End of Session Equipment Utilized During Treatment: Oxygen Nurse Communication: Mobility status;Precautions  Activity Tolerance: Patient tolerated treatment well Patient left: in bed;with call bell/phone within reach;with bed alarm set;with nursing/sitter in room  OT Visit Diagnosis: Other abnormalities of gait and mobility (R26.89);Hemiplegia and hemiparesis;Other symptoms and signs involving cognitive function Hemiplegia - Right/Left: Left Hemiplegia - dominant/non-dominant: Non-Dominant Hemiplegia - caused by: Cerebral infarction                Time: [id number] OT Time Calculation (min): 32 min Charges:  OT General Charges $OT Visit: 1 Visit OT Evaluation $OT Eval Moderate Complexity: 1 Mod  Delight Stare, OT Acute Rehabilitation Services Pager [phone] Office [phone]   Delight Stare 03/14/2019, 4:50 PM

## 2019-03-15 ENCOUNTER — Inpatient Hospital Stay (HOSPITAL_COMMUNITY): Payer: Medicare Other

## 2019-03-15 DIAGNOSIS — I4819 Other persistent atrial fibrillation: Secondary | ICD-10-CM

## 2019-03-15 DIAGNOSIS — J90 Pleural effusion, not elsewhere classified: Secondary | ICD-10-CM

## 2019-03-15 LAB — COMPREHENSIVE METABOLIC PANEL
ALT: 29 U/L (ref 0–44)
AST: 31 U/L (ref 15–41)
Albumin: 2.3 g/dL — ABNORMAL LOW (ref 3.5–5.0)
Alkaline Phosphatase: 126 U/L (ref 38–126)
Anion gap: 11 (ref 5–15)
BUN: 31 mg/dL — ABNORMAL HIGH (ref 8–23)
CO2: 23 mmol/L (ref 22–32)
Calcium: 8.5 mg/dL — ABNORMAL LOW (ref 8.9–10.3)
Chloride: 92 mmol/L — ABNORMAL LOW (ref 98–111)
Creatinine, Ser: 0.97 mg/dL (ref 0.61–1.24)
GFR calc Af Amer: >60 mL/min (ref >60)
GFR calc non Af Amer: >60 mL/min (ref >60)
Glucose, Bld: 142 mg/dL — ABNORMAL HIGH (ref 70–99)
Potassium: 3.8 mmol/L (ref 3.5–5.1)
Sodium: 126 mmol/L — ABNORMAL LOW (ref 135–145)
Total Bilirubin: 1.4 mg/dL — ABNORMAL HIGH (ref 0.3–1.2)
Total Protein: 5.7 g/dL — ABNORMAL LOW (ref 6.5–8.1)

## 2019-03-15 LAB — HEPARIN LEVEL (UNFRACTIONATED)
Heparin Unfractionated: <0.1 IU/mL — ABNORMAL LOW (ref 0.30–0.70)
Heparin Unfractionated: 0.24 IU/mL — ABNORMAL LOW (ref 0.30–0.70)
Heparin Unfractionated: 0.43 IU/mL (ref 0.30–0.70)
Heparin Unfractionated: 0.31 IU/mL (ref 0.30–0.70)

## 2019-03-15 LAB — CBC
HCT: 30.6 % — ABNORMAL LOW (ref 39.0–52.0)
Hemoglobin: 10.6 g/dL — ABNORMAL LOW (ref 13.0–17.0)
MCH: 34.2 pg — ABNORMAL HIGH (ref 26.0–34.0)
MCHC: 34.6 g/dL (ref 30.0–36.0)
MCV: 98.7 fL (ref 80.0–100.0)
Platelets: 167 10*3/uL (ref 150–400)
RBC: 3.1 MIL/uL — ABNORMAL LOW (ref 4.22–5.81)
RDW: 13.3 % (ref 11.5–15.5)
WBC: 10.4 10*3/uL (ref 4.0–10.5)
nRBC: 0 % (ref 0.0–0.2)

## 2019-03-15 LAB — COOXEMETRY PANEL
Carboxyhemoglobin: 1.2 % (ref 0.5–1.5)
Methemoglobin: 0.7 % (ref 0.0–1.5)
O2 Saturation: 57.2 %
Total hemoglobin: 11.2 g/dL — ABNORMAL LOW (ref 12.0–16.0)

## 2019-03-15 LAB — GLUCOSE, CAPILLARY
Glucose-Capillary: 149 mg/dL — ABNORMAL HIGH (ref 70–99)
Glucose-Capillary: 166 mg/dL — ABNORMAL HIGH (ref 70–99)
Glucose-Capillary: 167 mg/dL — ABNORMAL HIGH (ref 70–99)
Glucose-Capillary: 176 mg/dL — ABNORMAL HIGH (ref 70–99)
Glucose-Capillary: 151 mg/dL — ABNORMAL HIGH (ref 70–99)
Glucose-Capillary: 131 mg/dL — ABNORMAL HIGH (ref 70–99)

## 2019-03-15 MED ORDER — FUROSEMIDE 10 MG/ML IJ SOLN
80.0000 mg | Freq: Two times a day (BID) | INTRAMUSCULAR | Status: DC
  Administered 2019-03-15 – 2019-03-16 (×4): 80 mg via INTRAVENOUS
  Filled 2019-03-15 (×4): qty 8

## 2019-03-15 MED ORDER — METOPROLOL TARTRATE 25 MG PO TABS
25.0000 mg | ORAL_TABLET | Freq: Two times a day (BID) | ORAL | Status: DC
  Administered 2019-03-15 – 2019-03-20 (×10): 25 mg via ORAL
  Filled 2019-03-15 (×10): qty 1

## 2019-03-15 MED ORDER — AMIODARONE LOAD VIA INFUSION
150.0000 mg | Freq: Once | INTRAVENOUS | Status: AC
  Administered 2019-03-15: 150 mg via INTRAVENOUS
  Filled 2019-03-15: qty 83.34

## 2019-03-15 MED ORDER — METOPROLOL TARTRATE 5 MG/5ML IV SOLN
5.0000 mg | Freq: Once | INTRAVENOUS | Status: AC
  Administered 2019-03-15: 18:00:00 5 mg via INTRAVENOUS

## 2019-03-15 MED ORDER — IOHEXOL 350 MG/ML SOLN: 75.0000 mL | Freq: Once | INTRAVENOUS | Status: DC | PRN

## 2019-03-15 MED ORDER — METOPROLOL TARTRATE 5 MG/5ML IV SOLN
INTRAVENOUS | Status: AC
  Administered 2019-03-15: 5 mg via INTRAVENOUS
  Filled 2019-03-15: qty 5

## 2019-03-15 MED ORDER — HYDRALAZINE HCL 20 MG/ML IJ SOLN
10.0000 mg | INTRAMUSCULAR | Status: DC | PRN
  Administered 2019-03-15 – 2019-03-16 (×4): 10 mg via INTRAVENOUS
  Filled 2019-03-15 (×5): qty 1

## 2019-03-15 MED ORDER — LISINOPRIL 20 MG PO TABS: 20.0000 mg | ORAL_TABLET | Freq: Every day | ORAL | Status: DC

## 2019-03-15 MED ORDER — DILTIAZEM HCL ER 90 MG PO CP12
90.0000 mg | ORAL_CAPSULE | Freq: Two times a day (BID) | ORAL | Status: DC
  Administered 2019-03-15 (×2): 90 mg via ORAL
  Filled 2019-03-15 (×4): qty 1

## 2019-03-15 MED ORDER — DILTIAZEM HCL ER COATED BEADS 120 MG PO CP24: 120.0000 mg | ORAL_CAPSULE | Freq: Every day | ORAL | Status: DC

## 2019-03-15 MED FILL — Potassium Chloride Inj 2 mEq/ML: INTRAVENOUS | Qty: 40 | Status: AC

## 2019-03-15 MED FILL — Sodium Chloride IV Soln 0.9%: INTRAVENOUS | Qty: 2000 | Status: AC

## 2019-03-15 MED FILL — Sodium Bicarbonate IV Soln 8.4%: INTRAVENOUS | Qty: 50 | Status: AC

## 2019-03-15 MED FILL — Heparin Sodium (Porcine) Inj 1000 Unit/ML: INTRAMUSCULAR | Qty: 30 | Status: AC

## 2019-03-15 MED FILL — Dexmedetomidine HCl in NaCl 0.9% IV Soln 400 MCG/100ML: INTRAVENOUS | Qty: 100 | Status: AC

## 2019-03-15 MED FILL — Magnesium Sulfate Inj 50%: INTRAMUSCULAR | Qty: 20 | Status: AC

## 2019-03-15 MED FILL — Electrolyte-R (PH 7.4) Solution: INTRAVENOUS | Qty: 4000 | Status: AC

## 2019-03-15 MED FILL — Mannitol IV Soln 20%: INTRAVENOUS | Qty: 500 | Status: AC

## 2019-03-15 MED FILL — Heparin Sodium (Porcine) Inj 1000 Unit/ML: INTRAMUSCULAR | Qty: 10 | Status: AC

## 2019-03-15 MED FILL — Lidocaine HCl Local Soln Prefilled Syringe 100 MG/5ML (2%): INTRAMUSCULAR | Qty: 10 | Status: AC

## 2019-03-15 NOTE — Progress Notes (Signed)
[facility] for heparin Indication: atrial fibrillation  Allergies  Allergen Reactions  . Penicillins Hives and Rash    Has patient had a PCN reaction causing immediate rash, facial/tongue/throat swelling, SOB or lightheadedness with hypotension: Unknown Has patient had a PCN reaction causing severe rash involving mucus membranes or skin necrosis: Unknown Has patient had a PCN reaction that required hospitalization: No Has patient had a PCN reaction occurring within the last 10 years: No If all of the above answers are "NO", then may proceed with Cephalosporin use.      Patient Measurements: Height: 5\' 8"  (172.7 cm) Weight: 287 lb 14.7 oz (130.6 kg) IBW/kg (Calculated) : 68.4 Heparin Dosing Weight: 96kg  Vital Signs: Temp: 98.4 F (36.9 C) (10/07 0000) Temp Source: Oral (10/07 0000) BP: 132/102 (10/07 0000) Pulse Rate: 74 (10/07 0000)  Labs: Recent Labs    03/12/19 0653 03/13/19 0435 03/14/19 0449 03/14/19 2312  HGB 12.1* 11.0* 11.0*  --   HCT 35.9* 32.3* 31.9*  --   PLT 122* 127* 163  --   HEPARINUNFRC  --   --   --  <0.10*  CREATININE 2.00* 2.21* 1.33*  --     Estimated Creatinine Clearance: 73.1 mL/min (A) (by C-G formula based on SCr of 1.33 mg/dL (H)).   Medical History: Past Medical History:  Diagnosis Date  . Atrial fibrillation ([place])    a. dx 10/2017. b. s/p DCCV on 11/19/2017 with successful conversion to NSR but back in AFIB 3 weeks later  . CAD (coronary artery disease)    a. s/p PCI to LAD 2001. b. s/p stenting to the RCA and mid LAD December 2011, residual distal RCA disease treated medically. b. nuc 02/2013 abnormal with fixed defect seen in inferoapical, mid-inferior, and basal inferior walls, indicative of myocardial scar, no evidence of ischemia, EF 41% (Ef 55-60% by echo same time).  . Carotid artery disease ([place])    carotid Doppler course left side 5069% stenosis  . Dysrhythmia    AFib  . Habitual alcohol  use   . Hyperlipidemia   . Hypertension   . Low back pain   . OSA on CPAP   . Osteoarthritis   . Polysubstance abuse ([place])    use including marijuana and vicodin,which he obtained from the street  . PVC's (premature ventricular contractions)    status post Holter monitor normal sinus rhythm otherwise asymptomatic PVCs.  . Seasonal allergies     Medications:  Medications Prior to Admission  Medication Sig Dispense Refill Last Dose  . allopurinol (ZYLOPRIM) 300 MG tablet Take 300 mg by mouth every morning.    03/07/2019 at Unknown time  . apixaban (ELIQUIS) 5 MG TABS tablet Take 1 tablet (5 mg total) by mouth 2 (two) times daily. 180 tablet 1 03/07/2019 at 630  . atorvastatin (LIPITOR) 80 MG tablet Take 1 tablet (80 mg total) by mouth daily. (Patient taking differently: Take 80 mg by mouth every evening. ) 90 tablet 2 03/06/2019 at Unknown time  . Cholecalciferol (VITAMIN D-3) 1000 UNITS CAPS Take 1,000 Units by mouth every morning.    03/07/2019 at Unknown time  . Cyanocobalamin (VITAMIN B-12) 2500 MCG SUBL Place 2,500 mcg under the tongue every morning.    03/07/2019 at Unknown time  . diclofenac (VOLTAREN) 75 MG EC tablet Take 75 mg by mouth 2 (two) times daily.   03/07/2019 at Unknown time  . diltiazem (CARDIZEM CD) 120 MG 24 hr capsule TAKE 1 CAPSULE  BY MOUTH EVERY DAY (Patient taking differently: Take 120 mg by mouth every morning. ) 90 capsule 2 03/07/2019 at Unknown time  . fluticasone (FLONASE) 50 MCG/ACT nasal spray Place 2 sprays into the nose daily.     03/07/2019 at Unknown time  . folic acid (FOLVITE) 1 MG tablet TAKE ONE TABLET BY MOUTH EVERY DAY (Patient taking differently: Take 1 mg by mouth every morning. ) 30 tablet 6 03/07/2019 at Unknown time  . lisinopril (PRINIVIL,ZESTRIL) 40 MG tablet TAKE 1 TABLET BY MOUTH EVERY DAY (Patient taking differently: Take 40 mg by mouth every morning. ) 90 tablet 1 03/07/2019 at Unknown time  . loratadine (CLARITIN) 10 MG tablet Take 10 mg by mouth  every morning.    03/07/2019 at Unknown time  . magnesium oxide (MAG-OX) 400 MG tablet Take 400 mg by mouth at bedtime.    03/06/2019 at Unknown time  . meclizine (ANTIVERT) 32 MG tablet Take 32 mg by mouth daily as needed for dizziness.    UNKNOWN  . nitroGLYCERIN (NITROSTAT) 0.4 MG SL tablet PLACE 1 TABLET (0.4 MG TOTAL) UNDER THE TONGUE EVERY 5 (FIVE) MINUTES AS NEEDED. (Patient taking differently: Place 0.4 mg under the tongue every 5 (five) minutes as needed for chest pain. ) 25 tablet 3 03/07/2019 at 730  . Omega-3 Fatty Acids (FISH OIL) 1200 MG CAPS Take 1,200 mg by mouth 2 (two) times daily.    03/07/2019 at Unknown time  . polyethylene glycol (MIRALAX / GLYCOLAX) packet Take 17 g by mouth 3 (three) times a week.    Past Week at Unknown time  . triamterene-hydrochlorothiazide (MAXZIDE-25) 37.5-25 MG per tablet Take 1 tablet by mouth every morning.    03/07/2019 at Unknown time    Assessment: 63 yo afib and CVA and noted with NSTEMI s/p cath with 3VCAD.  Pharmacy consulted to dose heparin (plans noted to resume apixaban in a couple days).  Initial heparin level came back undetectable, on 1200 units/hr. Hgb 11, plt 163. No s/sx of bleeding, no infusion issues.   Goal of Therapy:  Heparin level 0.3-0.5 units/ml Monitor platelets by anticoagulation protocol: Yes   Plan:  -Increase heparin to 1500 units/hr -Heparin level in 6 hours and daily wth CBC daily  Antonietta Jewel, PharmD, BCCCP Clinical Pharmacist   Please check AMION for all [facility] phone numbers After 10:00 PM, call [facility] [phone]

## 2019-03-15 NOTE — Evaluation (Signed)
Speech Language Pathology Evaluation Patient Details Name: Martin [name] MRN: [id number] DOB: [birth year] Today's Date: 03/15/2019 Time: [id number] SLP Time Calculation (min) (ACUTE ONLY): 10 min  Problem List:  Patient Active Problem List   Diagnosis Date Noted  . Persistent atrial fibrillation ([place])   . Cerebral embolism with cerebral infarction 03/13/2019  . S/P CABG x 3 03/10/2019  . NSTEMI (non-ST elevated myocardial infarction) ([place]) 03/07/2019  . Unstable angina ([place]) 03/07/2019  . OSA on CPAP 05/27/2016  . Obesity 06/10/2011  . Insomnia 06/10/2011  . PVC's (premature ventricular contractions)   . CAD (coronary artery disease)   . Carotid artery disease ([place])   . DEGENERATIVE JOINT DISEASE 07/28/2010  . Dyslipidemia 06/17/2010  . SUBSTANCE ABUSE, MULTIPLE 06/17/2010  . Essential hypertension 06/17/2010  . CAD 06/17/2010  . BRADYCARDIA 06/17/2010   Past Medical History:  Past Medical History:  Diagnosis Date  . Atrial fibrillation ([place])    a. dx 10/2017. b. s/p DCCV on 11/19/2017 with successful conversion to NSR but back in AFIB 3 weeks later  . CAD (coronary artery disease)    a. s/p PCI to LAD 2001. b. s/p stenting to the RCA and mid LAD December 2011, residual distal RCA disease treated medically. b. nuc 02/2013 abnormal with fixed defect seen in inferoapical, mid-inferior, and basal inferior walls, indicative of myocardial scar, no evidence of ischemia, EF 41% (Ef 55-60% by echo same time).  . Carotid artery disease ([place])    carotid Doppler course left side 5069% stenosis  . Dysrhythmia    AFib  . Habitual alcohol use   . Hyperlipidemia   . Hypertension   . Low back pain   . OSA on CPAP   . Osteoarthritis   . Polysubstance abuse ([place])    use including marijuana and vicodin,which he obtained from the street  . PVC's (premature ventricular contractions)    status post Holter monitor normal sinus rhythm otherwise asymptomatic PVCs.  . Seasonal allergies     Past Surgical History:  Past Surgical History:  Procedure Laterality Date  . CARDIOVERSION N/A 11/19/2017   Procedure: CARDIOVERSION;  Surgeon: Herminio Commons, MD;  [facility];  Service: Cardiovascular;  Laterality: N/A;  . CORONARY ANGIOPLASTY WITH STENT PLACEMENT  2011  . CORONARY ARTERY BYPASS GRAFT N/A 03/10/2019   Procedure: CORONARY ARTERY BYPASS GRAFTING (CABG)X 3 Using Left Internal Mammary Artery and Right Saphenous Vein Grafts;  Surgeon: Ivin Poot, MD;  Location: [facility];  Service: Open Heart Surgery;  Laterality: N/A;  . LEFT HEART CATH AND CORONARY ANGIOGRAPHY N/A 03/08/2019   Procedure: LEFT HEART CATH AND CORONARY ANGIOGRAPHY;  Surgeon: Belva Crome, MD;  Location: [facility];  Service: Cardiovascular;  Laterality: N/A;  . TEE WITHOUT CARDIOVERSION N/A 03/10/2019   Procedure: TRANSESOPHAGEAL ECHOCARDIOGRAM (TEE);  Surgeon: Prescott Gum, Collier Salina, MD;  Location: [facility];  Service: Open Heart Surgery;  Laterality: N/A;  . TONSILLECTOMY     HPI:  Pt is a 63 y/o male with PMH of  CAD, paroxysmal atrial fibrillation, OA, low back pain, polysubstance abuse, presents with chest pain and possible NSTEMI. S/p L heart cath and coronary angiography 9/30, CABG x 3 03/10/19.  Course complicated by CT revealing R distal ACA infarct 03/13/19.    Assessment / Plan / Recommendation Clinical Impression  Pt presents with moderate cognitive impairment s/p acute CVA. He needs Mod cues for functional problem solving and selective attention. He seems to have emergent awareness of at least  his physical impairments, but he has difficulty figuring out how to work around them. Recall of new information is also impaired, remembering only 1 out of 4 words on delayed recall task and then making up additional neologistic words. PTA he said he was working and enjoyed spending time with his granddaughter. Pt would benefit from additional cognitive f/u to maximize safety.     SLP  Assessment  SLP Recommendation/Assessment: Patient needs continued Speech Lanaguage Pathology Services SLP Visit Diagnosis: Cognitive communication deficit (R41.841)    Follow Up Recommendations  Inpatient Rehab    Frequency and Duration min 2x/week  2 weeks      SLP Evaluation Cognition  Overall Cognitive Status: Impaired/Different from baseline Arousal/Alertness: Awake/alert Orientation Level: Oriented X4 Attention: Selective Selective Attention: Impaired Selective Attention Impairment: Verbal basic Memory: Impaired Memory Impairment: Retrieval deficit;Decreased recall of new information;Storage deficit Awareness: Impaired Awareness Impairment: Anticipatory impairment Problem Solving: Impaired Problem Solving Impairment: Functional basic Safety/Judgment: Impaired       Comprehension  Auditory Comprehension Overall Auditory Comprehension: Appears within functional limits for tasks assessed    Expression Expression Primary Mode of Expression: Verbal Verbal Expression Overall Verbal Expression: Appears within functional limits for tasks assessed   Oral / Motor  Oral Motor/Sensory Function Overall Oral Motor/Sensory Function: Within functional limits Motor Speech Overall Motor Speech: Appears within functional limits for tasks assessed   GO                    Venita Sheffield Karin Pinedo 03/15/2019, 9:37 AM  Pollyann Glen, M.A. [facility] Acute Environmental education officer [phone] Office [phone]

## 2019-03-15 NOTE — Progress Notes (Signed)
EVENING ROUNDS NOTE :     [address]       [place] [address]             [phone]                 5 Days Post-Op Procedure(s) (LRB): CORONARY ARTERY BYPASS GRAFTING (CABG)X 3 Using Left Internal Mammary Artery and Right Saphenous Vein Grafts (N/A) TRANSESOPHAGEAL ECHOCARDIOGRAM (TEE) (N/A)   Total Length of Stay:  LOS: 8 days  Events:  No events Resting comfortably Afib, rate controlled.    BP (!) 158/89 Comment: PA paged  Pulse 90   Temp 97.6 F (36.4 C) (Oral)   Resp 19   Ht 5\' 8"  (1.727 m)   Wt 131.2 kg   SpO2 91%   BMI 43.98 kg/m         . sodium chloride Stopped (03/11/19 1110)  . sodium chloride    . sodium chloride 10 mL/hr at 03/10/19 1428  . amiodarone 30 mg/hr (03/15/19 1443)  . heparin 1,500 Units/hr (03/15/19 1040)    I/O last 3 completed shifts: In: 47 [P.O.:240; I.V.:1488] Out: 3010 [Urine:3010]   CBC Latest Ref Rng & Units 03/15/2019 03/14/2019 03/13/2019  WBC 4.0 - 10.5 K/uL 10.4 10.8(H) 12.8(H)  Hemoglobin 13.0 - 17.0 g/dL 10.6(L) 11.0(L) 11.0(L)  Hematocrit 39.0 - 52.0 % 30.6(L) 31.9(L) 32.3(L)  Platelets 150 - 400 K/uL 167 163 127(L)    BMP Latest Ref Rng & Units 03/15/2019 03/14/2019 03/13/2019  Glucose 70 - 99 mg/dL 142(H) 158(H) 134(H)  BUN 8 - 23 mg/dL 31(H) 35(H) 36(H)  Creatinine 0.61 - 1.24 mg/dL 0.97 1.33(H) 2.21(H)  Sodium 135 - 145 mmol/L 126(L) 129(L) 129(L)  Potassium 3.5 - 5.1 mmol/L 3.8 3.8 3.8  Chloride 98 - 111 mmol/L 92(L) 92(L) 94(L)  CO2 22 - 32 mmol/L 23 25 25   Calcium 8.9 - 10.3 mg/dL 8.5(L) 8.7(L) 8.3(L)    ABG    Component Value Date/Time   PHART 7.410 03/11/2019 0646   PCO2ART 37.8 03/11/2019 0646   PO2ART 65.0 (L) 03/11/2019 0646   HCO3 23.9 03/11/2019 0646   TCO2 25 03/11/2019 0646   ACIDBASEDEF 3.0 (H) 03/10/2019 1833   O2SAT 57.2 03/15/2019 0440       Melodie Bouillon, MD 03/15/2019 6:17 PM

## 2019-03-15 NOTE — Progress Notes (Signed)
Physical Therapy Treatment Patient Details Name: Martin [name] MRN: [id number] DOB: 1955/07/11 Today's Date: 03/15/2019    History of Present Illness Pt is a 63 y/o male with PMH of  CAD, paroxysmal atrial fibrillation, OA, low back pain, polysubstance abuse, presents with chest pain and possible NSTEMI. S/p L heart cath and coronary angiography 9/30, CABG x 3 03/10/19.  Course complicated by CT revealing R distal ACA infarct 03/13/19.     PT Comments    Patient seen for mobility progression. Pt alert and eager to participate in therapy.  Pt tolerated sitting EOB and attempted sit to stand transfer before fatigued and return to bed. Pt requires max A +2 for all mobility given sternal precautions and min/mod A for sitting balance EOB due to L lateral bias that increases with fatigue. Pt continues to be a good candidate for CIR. Continue to progress as tolerated.     Follow Up Recommendations  CIR     Equipment Recommendations  Other (comment)(defer)    Recommendations for Other Services       Precautions / Restrictions Precautions Precautions: Fall;Sternal Precaution Comments: L hemiparesis; educated pt on "move in the tube", avoiding pushing/pulling, bracing with pillow with coughing/sneezing Restrictions Weight Bearing Restrictions: Yes(sternal precautions) Other Position/Activity Restrictions: sternal precautions     Mobility  Bed Mobility Overal bed mobility: Needs Assistance Bed Mobility: Sit to Sidelying;Rolling;Supine to Sit Rolling: +2 for physical assistance;Max assist   Supine to sit: +2 for physical assistance;HOB elevated;Max assist   Sit to sidelying: +2 for physical assistance;Max assist General bed mobility comments: pt assisted in bringing bilat LE to EOB; bed pad utilized to bring hips to EOB and assistance required to elevate trunk into sitting  while pt hugging heart pillow  Transfers Overall transfer level: Needs assistance Equipment used: (gait belt  and bed pad) Transfers: Sit to/from Stand Sit to Stand: Max assist;+2 physical assistance;From elevated surface         General transfer comment: attempted sit to stand with max A +2; pt able to lift buttocks minimally from EOB and unable to achieve standing  Ambulation/Gait                 Stairs             Wheelchair Mobility    Modified Rankin (Stroke Patients Only)       Balance Overall balance assessment: Needs assistance Sitting-balance support: Feet supported Sitting balance-Leahy Scale: Poor Sitting balance - Comments: increased L lateral bias with fatigue Postural control: Left lateral lean                                  Cognition Arousal/Alertness: Awake/alert Behavior During Therapy: Flat affect Overall Cognitive Status: Impaired/Different from baseline Area of Impairment: Following commands;Memory;Safety/judgement;Problem solving;Attention                   Current Attention Level: Sustained Memory: Decreased short-term memory;Decreased recall of precautions Following Commands: Follows one step commands with increased time;Follows one step commands consistently Safety/Judgement: Decreased awareness of safety;Decreased awareness of deficits   Problem Solving: Slow processing;Requires verbal cues;Requires tactile cues        Exercises      General Comments General comments (skin integrity, edema, etc.): L UE edema      Pertinent Vitals/Pain Pain Assessment: Faces Faces Pain Scale: Hurts little more Pain Location: chest- incisional Pain Descriptors / Indicators: Discomfort;Sore Pain Intervention(s):  Limited activity within patient's tolerance;Repositioned;Monitored during session    Home Living                      Prior Function            PT Goals (current goals can now be found in the care plan section) Acute Rehab PT Goals Patient Stated Goal: to get better Progress towards PT goals:  Progressing toward goals    Frequency    Min 4X/week      PT Plan Current plan remains appropriate    Co-evaluation              AM-PAC PT "6 Clicks" Mobility   Outcome Measure  Help needed turning from your back to your side while in a flat bed without using bedrails?: A Lot Help needed moving from lying on your back to sitting on the side of a flat bed without using bedrails?: Total Help needed moving to and from a bed to a chair (including a wheelchair)?: Total Help needed standing up from a chair using your arms (e.g., wheelchair or bedside chair)?: Total Help needed to walk in hospital room?: Total Help needed climbing 3-5 steps with a railing? : Total 6 Click Score: 7    End of Session Equipment Utilized During Treatment: Oxygen Activity Tolerance: Patient limited by fatigue Patient left: in bed;with call bell/phone within reach;with nursing/sitter in room;with SCD's reapplied Nurse Communication: Mobility status PT Visit Diagnosis: Muscle weakness (generalized) (M62.81);Hemiplegia and hemiparesis;Other abnormalities of gait and mobility (R26.89) Hemiplegia - Right/Left: Left Hemiplegia - dominant/non-dominant: Non-dominant Hemiplegia - caused by: Cerebral infarction     Time: [id number] PT Time Calculation (min) (ACUTE ONLY): 23 min  Charges:  $Gait Training: 8-22 mins $Therapeutic Activity: 8-22 mins                     Earney Navy, PTA Acute Rehabilitation Services Pager: [phone] Office: [phone]     Darliss Cheney 03/15/2019, 2:18 PM

## 2019-03-15 NOTE — Evaluation (Signed)
Clinical/Bedside Swallow Evaluation Patient Details  Name: Martin [name] MRN: [id number] Date of Birth: 1955/08/20  Today's Date: 03/15/2019 Time: SLP Start Time (ACUTE ONLY): [id number] SLP Stop Time (ACUTE ONLY): 0905 SLP Time Calculation (min) (ACUTE ONLY): 14 min  Past Medical History:  Past Medical History:  Diagnosis Date  . Atrial fibrillation ([place])    a. dx 10/2017. b. s/p DCCV on 11/19/2017 with successful conversion to NSR but back in AFIB 3 weeks later  . CAD (coronary artery disease)    a. s/p PCI to LAD 2001. b. s/p stenting to the RCA and mid LAD December 2011, residual distal RCA disease treated medically. b. nuc 02/2013 abnormal with fixed defect seen in inferoapical, mid-inferior, and basal inferior walls, indicative of myocardial scar, no evidence of ischemia, EF 41% (Ef 55-60% by echo same time).  . Carotid artery disease ([place])    carotid Doppler course left side 5069% stenosis  . Dysrhythmia    AFib  . Habitual alcohol use   . Hyperlipidemia   . Hypertension   . Low back pain   . OSA on CPAP   . Osteoarthritis   . Polysubstance abuse ([place])    use including marijuana and vicodin,which he obtained from the street  . PVC's (premature ventricular contractions)    status post Holter monitor normal sinus rhythm otherwise asymptomatic PVCs.  . Seasonal allergies    Past Surgical History:  Past Surgical History:  Procedure Laterality Date  . CARDIOVERSION N/A 11/19/2017   Procedure: CARDIOVERSION;  Surgeon: Herminio Commons, MD;  [facility];  Service: Cardiovascular;  Laterality: N/A;  . CORONARY ANGIOPLASTY WITH STENT PLACEMENT  2011  . CORONARY ARTERY BYPASS GRAFT N/A 03/10/2019   Procedure: CORONARY ARTERY BYPASS GRAFTING (CABG)X 3 Using Left Internal Mammary Artery and Right Saphenous Vein Grafts;  Surgeon: Ivin Poot, MD;  Location: [facility];  Service: Open Heart Surgery;  Laterality: N/A;  . LEFT HEART CATH AND CORONARY ANGIOGRAPHY N/A 03/08/2019   Procedure: LEFT HEART CATH AND CORONARY ANGIOGRAPHY;  Surgeon: Belva Crome, MD;  Location: [facility];  Service: Cardiovascular;  Laterality: N/A;  . TEE WITHOUT CARDIOVERSION N/A 03/10/2019   Procedure: TRANSESOPHAGEAL ECHOCARDIOGRAM (TEE);  Surgeon: Prescott Gum, Collier Salina, MD;  Location: [facility];  Service: Open Heart Surgery;  Laterality: N/A;  . TONSILLECTOMY     HPI:  Pt is a 63 y/o male with PMH of  CAD, paroxysmal atrial fibrillation, OA, low back pain, polysubstance abuse, presents with chest pain and possible NSTEMI. S/p L heart cath and coronary angiography 9/30, CABG x 3 03/10/19.  Course complicated by CT revealing R distal ACA infarct 03/13/19.    Assessment / Plan / Recommendation Clinical Impression  Pt initially presents with swift appearing swallow and no overt cranial nerve impairment. As trials progressed, still only testing liquids and purees, pt began to have increased coughing, snorting, and belching. He described feeling like he had heartburn and post-nasal drip. His cough appeared to be more volitional in nature, as if he was trying to clear the globus sensation, although it was also guarded from recent surgery. Discussed with RN, who had said that pt had been consuming liquids well up to this point (and taking his meds well too). Will leave him on current full liquid diet for now with close monitoring and use of aspiration and esophageal precautions.  SLP Visit Diagnosis: Dysphagia, unspecified (R13.10)    Aspiration Risk  Moderate aspiration risk    Diet  Recommendation Thin liquid;Other (Comment)(maintain full liquid diet)   Liquid Administration via: Cup;Straw Medication Administration: Whole meds with puree Supervision: Patient able to self feed;Full supervision/cueing for compensatory strategies Compensations: Slow rate;Small sips/bites;Minimize environmental distractions;Follow solids with liquid Postural Changes: Seated upright at 90 degrees;Remain upright for at  least 30 minutes after po intake    Other  Recommendations Oral Care Recommendations: Oral care BID Other Recommendations: Have oral suction available   Follow up Recommendations Inpatient Rehab      Frequency and Duration min 2x/week  2 weeks       Prognosis Prognosis for Safe Diet Advancement: Good      Swallow Study   General HPI: Pt is a 63 y/o male with PMH of  CAD, paroxysmal atrial fibrillation, OA, low back pain, polysubstance abuse, presents with chest pain and possible NSTEMI. S/p L heart cath and coronary angiography 9/30, CABG x 3 03/10/19.  Course complicated by CT revealing R distal ACA infarct 03/13/19.  Type of Study: Bedside Swallow Evaluation Previous Swallow Assessment: none in chart Diet Prior to this Study: Thin liquids;Other (Comment)(full liquid) Temperature Spikes Noted: No Respiratory Status: Nasal cannula History of Recent Intubation: (for procedure only) Behavior/Cognition: Alert;Cooperative;Requires cueing Oral Cavity Assessment: Within Functional Limits Oral Care Completed by SLP: No Oral Cavity - Dentition: Adequate natural dentition Vision: Functional for self-feeding Self-Feeding Abilities: Needs assist Patient Positioning: Upright in bed Baseline Vocal Quality: Normal Volitional Cough: Weak;Other (Comment)(guarded given recent surgery) Volitional Swallow: Able to elicit    Oral/Motor/Sensory Function Overall Oral Motor/Sensory Function: Within functional limits   Ice Chips Ice chips: Not tested   Thin Liquid Thin Liquid: Impaired Presentation: Cup;Self Fed;Straw Pharyngeal  Phase Impairments: Cough - Immediate;Cough - Delayed    Nectar Thick Nectar Thick Liquid: Impaired Presentation: Self Fed;Straw Pharyngeal Phase Impairments: Cough - Immediate;Cough - Delayed   Honey Thick Honey Thick Liquid: Not tested   Puree Puree: Impaired Presentation: Self Fed;Spoon Pharyngeal Phase Impairments: Cough - Immediate;Cough - Delayed   Solid      Solid: Not tested      Venita Sheffield Ireland Chagnon 03/15/2019,9:25 AM   Pollyann Glen, M.A. [facility] Acute Environmental education officer [phone] Office [phone]

## 2019-03-15 NOTE — Progress Notes (Signed)
[facility] for heparin Indication: atrial fibrillation  Allergies  Allergen Reactions  . Penicillins Hives and Rash    Has patient had a PCN reaction causing immediate rash, facial/tongue/throat swelling, SOB or lightheadedness with hypotension: Unknown Has patient had a PCN reaction causing severe rash involving mucus membranes or skin necrosis: Unknown Has patient had a PCN reaction that required hospitalization: No Has patient had a PCN reaction occurring within the last 10 years: No If all of the above answers are "NO", then may proceed with Cephalosporin use.      Patient Measurements: Height: 5\' 8"  (172.7 cm) Weight: 289 lb 3.9 oz (131.2 kg) IBW/kg (Calculated) : 68.4 Heparin Dosing Weight: 96kg  Vital Signs: Temp: 98.2 F (36.8 C) (10/07 1200) Temp Source: Oral (10/07 1200) BP: 120/82 (10/07 1245) Pulse Rate: 85 (10/07 1245)  Labs: Recent Labs    03/13/19 0435 03/14/19 0449  03/15/19 0437 03/15/19 0744 03/15/19 0800 03/15/19 1132  HGB 11.0* 11.0*  --  10.6*  --   --   --   HCT 32.3* 31.9*  --  30.6*  --   --   --   PLT 127* 163  --  167  --   --   --   HEPARINUNFRC  --   --    < >  --  0.43 0.24* 0.31  CREATININE 2.21* 1.33*  --  0.97  --   --   --    < > = values in this interval not displayed.    Estimated Creatinine Clearance: 100.4 mL/min (by C-G formula based on SCr of 0.97 mg/dL).   Medical History: Past Medical History:  Diagnosis Date  . Atrial fibrillation ([place])    a. dx 10/2017. b. s/p DCCV on 11/19/2017 with successful conversion to NSR but back in AFIB 3 weeks later  . CAD (coronary artery disease)    a. s/p PCI to LAD 2001. b. s/p stenting to the RCA and mid LAD December 2011, residual distal RCA disease treated medically. b. nuc 02/2013 abnormal with fixed defect seen in inferoapical, mid-inferior, and basal inferior walls, indicative of myocardial scar, no evidence of ischemia, EF 41% (Ef 55-60% by echo  same time).  . Carotid artery disease ([place])    carotid Doppler course left side 5069% stenosis  . Dysrhythmia    AFib  . Habitual alcohol use   . Hyperlipidemia   . Hypertension   . Low back pain   . OSA on CPAP   . Osteoarthritis   . Polysubstance abuse ([place])    use including marijuana and vicodin,which he obtained from the street  . PVC's (premature ventricular contractions)    status post Holter monitor normal sinus rhythm otherwise asymptomatic PVCs.  . Seasonal allergies     Medications:  Medications Prior to Admission  Medication Sig Dispense Refill Last Dose  . allopurinol (ZYLOPRIM) 300 MG tablet Take 300 mg by mouth every morning.    03/07/2019 at Unknown time  . apixaban (ELIQUIS) 5 MG TABS tablet Take 1 tablet (5 mg total) by mouth 2 (two) times daily. 180 tablet 1 03/07/2019 at 630  . atorvastatin (LIPITOR) 80 MG tablet Take 1 tablet (80 mg total) by mouth daily. (Patient taking differently: Take 80 mg by mouth every evening. ) 90 tablet 2 03/06/2019 at Unknown time  . Cholecalciferol (VITAMIN D-3) 1000 UNITS CAPS Take 1,000 Units by mouth every morning.    03/07/2019 at Unknown time  . Cyanocobalamin (  VITAMIN B-12) 2500 MCG SUBL Place 2,500 mcg under the tongue every morning.    03/07/2019 at Unknown time  . diclofenac (VOLTAREN) 75 MG EC tablet Take 75 mg by mouth 2 (two) times daily.   03/07/2019 at Unknown time  . diltiazem (CARDIZEM CD) 120 MG 24 hr capsule TAKE 1 CAPSULE BY MOUTH EVERY DAY (Patient taking differently: Take 120 mg by mouth every morning. ) 90 capsule 2 03/07/2019 at Unknown time  . fluticasone (FLONASE) 50 MCG/ACT nasal spray Place 2 sprays into the nose daily.     03/07/2019 at Unknown time  . folic acid (FOLVITE) 1 MG tablet TAKE ONE TABLET BY MOUTH EVERY DAY (Patient taking differently: Take 1 mg by mouth every morning. ) 30 tablet 6 03/07/2019 at Unknown time  . lisinopril (PRINIVIL,ZESTRIL) 40 MG tablet TAKE 1 TABLET BY MOUTH EVERY DAY (Patient taking  differently: Take 40 mg by mouth every morning. ) 90 tablet 1 03/07/2019 at Unknown time  . loratadine (CLARITIN) 10 MG tablet Take 10 mg by mouth every morning.    03/07/2019 at Unknown time  . magnesium oxide (MAG-OX) 400 MG tablet Take 400 mg by mouth at bedtime.    03/06/2019 at Unknown time  . meclizine (ANTIVERT) 32 MG tablet Take 32 mg by mouth daily as needed for dizziness.    UNKNOWN  . nitroGLYCERIN (NITROSTAT) 0.4 MG SL tablet PLACE 1 TABLET (0.4 MG TOTAL) UNDER THE TONGUE EVERY 5 (FIVE) MINUTES AS NEEDED. (Patient taking differently: Place 0.4 mg under the tongue every 5 (five) minutes as needed for chest pain. ) 25 tablet 3 03/07/2019 at 730  . Omega-3 Fatty Acids (FISH OIL) 1200 MG CAPS Take 1,200 mg by mouth 2 (two) times daily.    03/07/2019 at Unknown time  . polyethylene glycol (MIRALAX / GLYCOLAX) packet Take 17 g by mouth 3 (three) times a week.    Past Week at Unknown time  . triamterene-hydrochlorothiazide (MAXZIDE-25) 37.5-25 MG per tablet Take 1 tablet by mouth every morning.    03/07/2019 at Unknown time    Assessment: 63 yo afib and CVA and noted with NSTEMI s/p cath with 3VCAD.  Pharmacy consulted to dose heparin (plans noted to resume apixaban in a couple days).  Heparin level this afternoon is at goal 0.31, cbc low but stable. No bleeding issues noted.   Goal of Therapy:  Heparin level 0.3-0.5 units/ml Monitor platelets by anticoagulation protocol: Yes   Plan:  -Continue heparin at 1500 units/hr -Heparin level daily for now  Erin Hearing PharmD., BCPS Clinical Pharmacist 03/15/2019 1:36 PM  Please check AMION for all [place] phone numbers After 10:00 PM, call [facility] [place] [phone]

## 2019-03-15 NOTE — Progress Notes (Signed)
STROKE TEAM PROGRESS NOTE   SUBJECTIVE (INTERVAL HISTORY) Pt wife at bedside. RN is changing his sheets. Pt still has left leg plegia but left arm improved 3/5 now. Tolerating heparin IV. CTA head and neck consistent with right ACA thrombus. Can transition to eliquis once ready from cardiology standpoint.   OBJECTIVE Temp:  [97.9 F (36.6 C)-99.2 F (37.3 C)] 98.2 F (36.8 C) (10/07 1200) Pulse Rate:  [64-101] 90 (10/07 1345) Cardiac Rhythm: Atrial fibrillation;Normal sinus rhythm (10/07 1200) Resp:  [12-23] 18 (10/07 1330) BP: (88-182)/(63-112) 154/100 (10/07 1345) SpO2:  [84 %-99 %] 93 % (10/07 1345) Weight:  [131.2 kg] 131.2 kg (10/07 0444)  Recent Labs  Lab 03/14/19 1515 03/14/19 1955 03/14/19 2341 03/15/19 0406 03/15/19 0730  GLUCAP 170* 143* 140* 149* 166*   Recent Labs  Lab 03/10/19 1857 03/11/19 0409  03/11/19 1712 03/12/19 0335 03/12/19 0653 03/13/19 0435 03/14/19 0449 03/15/19 0437  NA 137 134*   < > 134* 133* 132* 129* 129* 126*  K 3.8 3.9   < > 4.2 4.1 4.0 3.8 3.8 3.8  CL 107 105  --  101 98 99 94* 92* 92*  CO2 22 21*  --  23 24 24 25 25 23   GLUCOSE 132* 134*  --  174* 153* 181* 134* 158* 142*  BUN 14 14  --  20 27* 27* 36* 35* 31*  CREATININE 0.75 0.88  --  1.42* 1.81* 2.00* 2.21* 1.33* 0.97  CALCIUM 7.7* 7.7*  --  8.3* 8.2* 8.2* 8.3* 8.7* 8.5*  MG 2.7* 2.2  --  2.1  --  2.1  --   --   --    < > = values in this interval not displayed.   Recent Labs  Lab 03/12/19 0653 03/12/19 0803 03/13/19 0435 03/14/19 0449 03/15/19 0437  AST 33 35 26 26 31   ALT 25 26 22 23 29   ALKPHOS 50 50 53 73 126  BILITOT 1.2 1.7* 0.8 1.0 1.4*  PROT 5.9* 6.0* 5.4* 5.4* 5.7*  ALBUMIN 3.0* 3.1* 2.7* 2.4* 2.3*   Recent Labs  Lab 03/12/19 0335 03/12/19 0653 03/13/19 0435 03/14/19 0449 03/15/19 0437  WBC 14.5* 15.3* 12.8* 10.8* 10.4  HGB 11.8* 12.1* 11.0* 11.0* 10.6*  HCT 35.4* 35.9* 32.3* 31.9* 30.6*  MCV 100.9* 101.1* 98.8 97.9 98.7  PLT 130* 122* 127* 163 167    No results for input(s): CKTOTAL, CKMB, CKMBINDEX, TROPONINI in the last 168 hours. No results for input(s): LABPROT, INR in the last 72 hours. No results for input(s): COLORURINE, LABSPEC, [place], GLUCOSEU, HGBUR, BILIRUBINUR, KETONESUR, PROTEINUR, UROBILINOGEN, NITRITE, LEUKOCYTESUR in the last 72 hours.  Invalid input(s): APPERANCEUR     Component Value Date/Time   CHOL 170 03/07/2019 2358   TRIG 218 (H) 03/07/2019 2358   HDL 41 03/07/2019 2358   CHOLHDL 4.1 03/07/2019 2358   VLDL 44 (H) 03/07/2019 2358   LDLCALC 85 03/07/2019 2358   Lab Results  Component Value Date   HGBA1C 5.6 03/09/2019      Component Value Date/Time   LABOPIA NONE DETECTED 05/20/2010 1326   COCAINSCRNUR NONE DETECTED 05/20/2010 1326   LABBENZ POSITIVE (A) 05/20/2010 1326   AMPHETMU NONE DETECTED 05/20/2010 1326   THCU NONE DETECTED 05/20/2010 1326   LABBARB  05/20/2010 1326    NONE DETECTED        DRUG SCREEN FOR MEDICAL PURPOSES ONLY.  IF CONFIRMATION IS NEEDED FOR ANY PURPOSE, NOTIFY LAB WITHIN 5 DAYS.  LOWEST DETECTABLE LIMITS FOR URINE DRUG SCREEN Drug Class       Cutoff (ng/mL) Amphetamine      1000 Barbiturate      200 Benzodiazepine   [id number] Tricyclics       [id number] Opiates          300 Cocaine          300 THC              50    No results for input(s): ETH in the last 168 hours.  I have personally reviewed the radiological images below and agree with the radiology interpretations.  Ct Angio Head W Or Wo Contrast  Result Date: 03/14/2019 CLINICAL DATA:  Anterior cerebral artery territory infarct follow-up EXAM: CT ANGIOGRAPHY HEAD AND NECK TECHNIQUE: Multidetector CT imaging of the head and neck was performed using the standard protocol during bolus administration of intravenous contrast. Multiplanar CT image reconstructions and MIPs were obtained to evaluate the vascular anatomy. Carotid stenosis measurements (when applicable) are obtained utilizing NASCET criteria, using the  distal internal carotid diameter as the denominator. CONTRAST:  34mL OMNIPAQUE IOHEXOL 350 MG/ML SOLN COMPARISON:  CTA head neck 10/23/2017 Head CT 03/13/2019 FINDINGS: CTA NECK FINDINGS SKELETON: There is no bony spinal canal stenosis. No lytic or blastic lesion. OTHER NECK: Status post recent coronary artery bypass graft with median sternotomy. There is a retrosternal fluid collection that measures 4.3 x 2.0 x 5.2 cm. UPPER CHEST: Bilateral pleural effusions. There is a large loculated component of the left effusion. AORTIC ARCH: There is moderate calcific atherosclerosis of the aortic arch. There is no aneurysm, dissection or hemodynamically significant stenosis of the visualized portion of the aorta. Normal variant aortic arch branching pattern with the brachiocephalic and left common carotid arteries sharing a common origin. The visualized proximal subclavian arteries are widely patent. RIGHT CAROTID SYSTEM: There is mild narrowing of the proximal common carotid artery due to noncalcified plaque. There is circumferential atherosclerotic calcification at the carotid bifurcation and proximal internal carotid artery resulting in less than 50% stenosis. The distal internal carotid artery is widely patent. Proximal external carotid artery is normal. LEFT CAROTID SYSTEM: Mild common carotid atherosclerosis. Circumferential calcification of the proximal ICA without hemodynamically significant stenosis. Normal distal ICA aside from minor calcification. There is mild narrowing of the origin of the external carotid artery due to noncalcified plaque. VERTEBRAL ARTERIES: Right dominant configuration. Poor visualization of the right origin. Left origin is patent. There is no dissection, occlusion or flow-limiting stenosis to the skull base (V1-V3 segments). CTA HEAD FINDINGS POSTERIOR CIRCULATION: --Vertebral arteries: Normal right V4 segment. There is loss of opacification of the left V4 segment distal to the PICA origin.  --Posterior inferior cerebellar arteries (PICA): Patent origins from the vertebral arteries. --Anterior inferior cerebellar arteries (AICA): Right dominant --Basilar artery: Diminutive --Superior cerebellar arteries: Normal. --Posterior cerebral arteries: Normal. Both are predominantly supplied by the posterior communicating arteries (p-comm). ANTERIOR CIRCULATION: --Intracranial internal carotid arteries: Atherosclerotic calcification of the internal carotid arteries at the skull base without hemodynamically significant stenosis. --Anterior cerebral arteries (ACA): There is a new focus of calcification in the distal right A2 segment (series 12, image 22) that may indicate partially calcified thrombus. However, the remainder of the vessel is normally opacified. Calcification in the left A2 segment is unchanged. Both A1 segments are present. Patent anterior communicating artery (a-comm). --Middle cerebral arteries (MCA): Normal. VENOUS SINUSES: As permitted by contrast timing, patent. ANATOMIC VARIANTS: Bilateral fetal origins of the posterior cerebral  arteries. Review of the MIP images confirms the above findings. IMPRESSION: 1. No emergent large vessel occlusion or hemodynamically significant stenosis. 2. New focus of calcification in the right anterior cerebral artery distal A2 segment (s12:i22), which may indicate partially calcified thrombus. The vessel is patent distal to this, however. 3. Decreased opacification of the distal V4 segment of the left vertebral artery, distal to the PICA origin, age-indeterminate. Bilateral fetal origins of the posterior cerebral arteries with diminutive vertebrobasilar system. 4. Status post recent coronary artery bypass graft with retrosternal fluid collection that measures 4.3 x 2.0 x 5.2 cm. In the immediate postoperative setting, this is favored to be a seroma. 5. Bilateral pleural effusions with large loculated component of the left effusion. 6. Aortic Atherosclerosis  (ICD10-I70.0). Electronically Signed   By: Ulyses Jarred M.D.   On: 03/14/2019 19:36   Dg Chest 1 View  Result Date: 03/10/2019 CLINICAL DATA:  Status post CABG, rule out pneumo. EXAM: CHEST  1 VIEW COMPARISON:  03/07/2019 FINDINGS: Now status post median sternotomy and CABG. Swan-Ganz catheter in proximal right pulmonary artery. A transesophageal echocardiographic pro passes to the level of the T10 vertebral body. Seven intact sternal wires are noted. Left-sided chest tube in place without signs of pneumothorax on supine radiograph. Cardiomediastinal contours with mild widening superiorly likely secondary to postoperative change otherwise unchanged accounting for low lung volumes. There is a single tube in the midline projecting over sternal wires extending to the level of the sternal manubrium. This may represent mediastinal drain. IMPRESSION: 1. Status post CABG with left chest tube without visible pneumothorax. 2. Swan-Ganz catheter and transesophageal echocardiographic probe in place. 3. Midline drainage tubing projects over the mediastinum, potentially mediastinal drain, clinical correlation suggested. Electronically Signed   By: Zetta Bills M.D.   On: 03/10/2019 13:30   Dg Chest 2 View  Result Date: 03/07/2019 CLINICAL DATA:  Chest pain for 2 weeks. EXAM: CHEST - 2 VIEW COMPARISON:  10/28/2017 and prior radiographs FINDINGS: Cardiomegaly and LEFT basilar scarring again noted. There is no evidence of focal airspace disease, pulmonary edema, suspicious pulmonary nodule/mass, pleural effusion, or pneumothorax. No acute bony abnormalities are identified. IMPRESSION: Cardiomegaly without evidence of acute cardiopulmonary disease. Electronically Signed   By: Margarette Canada M.D.   On: 03/07/2019 14:30   Ct Head Wo Contrast  Result Date: 03/14/2019 CLINICAL DATA:  Stroke, follow-up. EXAM: CT HEAD WITHOUT CONTRAST TECHNIQUE: Contiguous axial images were obtained from the base of the skull through the  vertex without intravenous contrast. COMPARISON:  CT head without contrast 03/13/2019 FINDINGS: Brain: Progressive hypoattenuation is noted within the right cingulate gyrus. Subjacent white matter hypoattenuation is progressed as well. No acute hemorrhage is noted. Precentral gyrus is intact. Periventricular white matter changes are otherwise stable. No significant extraaxial fluid collection is present. The brainstem and cerebellum are within normal limits. Vascular: Atherosclerotic changes are noted within the cavernous internal carotid arteries. There is no hyperdense vessel. Skull: Calvarium is intact. No focal lytic or blastic lesions are present. Sinuses/Orbits: Mild mucosal thickening is present within ethmoid air cells. No fluid levels are present. The globes and orbits are within normal limits. IMPRESSION: 1. Progressive right ACA territory nonhemorrhagic infarct. 2. Progressive adjacent white matter infarct. 3. Atherosclerosis. 4. Diffuse small vessel disease. Electronically Signed   By: San Morelle M.D.   On: 03/14/2019 12:12   Ct Head Wo Contrast  Result Date: 03/13/2019 CLINICAL DATA:  Generalized muscle weakness. Left-sided weakness EXAM: CT HEAD WITHOUT CONTRAST TECHNIQUE: Contiguous axial images  were obtained from the base of the skull through the vertex without intravenous contrast. COMPARISON:  Brain MRI 04/18/2018 FINDINGS: Brain: Small area of cytotoxic appearance in the parasagittal and posterior right frontal region, distal ACA territory. No hemorrhage, hydrocephalus, or masslike finding. Low-density in the cerebral white matter that is fairly symmetric and likely chronic microvascular ischemia but progressed from MR. Small cortically based calcification along the right hemisphere, nonspecific in isolation. Vascular: Atherosclerotic calcification. Skull: Negative. Sinuses/Orbits: Negative. Other: These results were communicated to Dr. Lorraine Lax at 6:52 amon 10/5/2020by text page via  the [facility] messaging system. IMPRESSION: 1. Small acute cortical infarct in the distal right ACA territory. 2. Cerebral white matter low-density that is mild and symmetric but may have progressed from 2019, question right watershed white matter infarcts as well. Electronically Signed   By: Monte Fantasia M.D.   On: 03/13/2019 06:58   Ct Angio Neck W Or Wo Contrast  Result Date: 03/14/2019 CLINICAL DATA:  Anterior cerebral artery territory infarct follow-up EXAM: CT ANGIOGRAPHY HEAD AND NECK TECHNIQUE: Multidetector CT imaging of the head and neck was performed using the standard protocol during bolus administration of intravenous contrast. Multiplanar CT image reconstructions and MIPs were obtained to evaluate the vascular anatomy. Carotid stenosis measurements (when applicable) are obtained utilizing NASCET criteria, using the distal internal carotid diameter as the denominator. CONTRAST:  82mL OMNIPAQUE IOHEXOL 350 MG/ML SOLN COMPARISON:  CTA head neck 10/23/2017 Head CT 03/13/2019 FINDINGS: CTA NECK FINDINGS SKELETON: There is no bony spinal canal stenosis. No lytic or blastic lesion. OTHER NECK: Status post recent coronary artery bypass graft with median sternotomy. There is a retrosternal fluid collection that measures 4.3 x 2.0 x 5.2 cm. UPPER CHEST: Bilateral pleural effusions. There is a large loculated component of the left effusion. AORTIC ARCH: There is moderate calcific atherosclerosis of the aortic arch. There is no aneurysm, dissection or hemodynamically significant stenosis of the visualized portion of the aorta. Normal variant aortic arch branching pattern with the brachiocephalic and left common carotid arteries sharing a common origin. The visualized proximal subclavian arteries are widely patent. RIGHT CAROTID SYSTEM: There is mild narrowing of the proximal common carotid artery due to noncalcified plaque. There is circumferential atherosclerotic calcification at the carotid bifurcation and  proximal internal carotid artery resulting in less than 50% stenosis. The distal internal carotid artery is widely patent. Proximal external carotid artery is normal. LEFT CAROTID SYSTEM: Mild common carotid atherosclerosis. Circumferential calcification of the proximal ICA without hemodynamically significant stenosis. Normal distal ICA aside from minor calcification. There is mild narrowing of the origin of the external carotid artery due to noncalcified plaque. VERTEBRAL ARTERIES: Right dominant configuration. Poor visualization of the right origin. Left origin is patent. There is no dissection, occlusion or flow-limiting stenosis to the skull base (V1-V3 segments). CTA HEAD FINDINGS POSTERIOR CIRCULATION: --Vertebral arteries: Normal right V4 segment. There is loss of opacification of the left V4 segment distal to the PICA origin. --Posterior inferior cerebellar arteries (PICA): Patent origins from the vertebral arteries. --Anterior inferior cerebellar arteries (AICA): Right dominant --Basilar artery: Diminutive --Superior cerebellar arteries: Normal. --Posterior cerebral arteries: Normal. Both are predominantly supplied by the posterior communicating arteries (p-comm). ANTERIOR CIRCULATION: --Intracranial internal carotid arteries: Atherosclerotic calcification of the internal carotid arteries at the skull base without hemodynamically significant stenosis. --Anterior cerebral arteries (ACA): There is a new focus of calcification in the distal right A2 segment (series 12, image 22) that may indicate partially calcified thrombus. However, the remainder of the vessel is  normally opacified. Calcification in the left A2 segment is unchanged. Both A1 segments are present. Patent anterior communicating artery (a-comm). --Middle cerebral arteries (MCA): Normal. VENOUS SINUSES: As permitted by contrast timing, patent. ANATOMIC VARIANTS: Bilateral fetal origins of the posterior cerebral arteries. Review of the MIP images  confirms the above findings. IMPRESSION: 1. No emergent large vessel occlusion or hemodynamically significant stenosis. 2. New focus of calcification in the right anterior cerebral artery distal A2 segment (s12:i22), which may indicate partially calcified thrombus. The vessel is patent distal to this, however. 3. Decreased opacification of the distal V4 segment of the left vertebral artery, distal to the PICA origin, age-indeterminate. Bilateral fetal origins of the posterior cerebral arteries with diminutive vertebrobasilar system. 4. Status post recent coronary artery bypass graft with retrosternal fluid collection that measures 4.3 x 2.0 x 5.2 cm. In the immediate postoperative setting, this is favored to be a seroma. 5. Bilateral pleural effusions with large loculated component of the left effusion. 6. Aortic Atherosclerosis (ICD10-I70.0). Electronically Signed   By: Ulyses Jarred M.D.   On: 03/14/2019 19:36   Dg Chest Port 1 View  Result Date: 03/15/2019 CLINICAL DATA:  Status post coronary artery bypass graft. EXAM: PORTABLE CHEST 1 VIEW COMPARISON:  Radiograph of March 14, 2019. FINDINGS: Stable cardiomegaly. Status post coronary bypass graft. Right-sided PICC line is unchanged in position. Stable bibasilar atelectasis is noted with associated pleural effusions. Stable left midlung opacity is noted most consistent with small hematoma related to prior chest tube. Bony thorax is unremarkable. IMPRESSION: Stable bibasilar atelectasis is noted with associated pleural effusions. Stable rounded density seen in left midlung most consistent with hematoma secondary to prior chest tube. Electronically Signed   By: Marijo Conception M.D.   On: 03/15/2019 08:33   Dg Chest Port 1 View  Result Date: 03/14/2019 CLINICAL DATA:  Short of breath.  Status post CABG surgery. EXAM: PORTABLE CHEST 1 VIEW COMPARISON:  03/13/2019 and earlier exams. FINDINGS: Left chest tube has been removed. There is opacity corresponding to  the chest tube tract consistent with fluid/atelectasis. No pneumothorax. Persistent opacity noted at the bases consistent with a combination of pleural effusions and atelectasis. No convincing pulmonary edema. No mediastinal widening.  Cardiac silhouette is enlarged but stable. Stable right internal jugular Cordis. IMPRESSION: 1. Status post removal of the left chest tube.  No pneumothorax. 2. Persistent lung base opacity consistent with a combination of pleural effusions and atelectasis. Electronically Signed   By: Lajean Manes M.D.   On: 03/14/2019 08:23   Dg Chest Port 1 View  Result Date: 03/13/2019 CLINICAL DATA:  Chest tube placement EXAM: PORTABLE CHEST 1 VIEW COMPARISON:  03/12/2019 FINDINGS: The left-sided chest tube is stable in positioning. The right-sided central venous catheter is stable in positioning. The heart size remains significantly enlarged. The patient is status post prior median sternotomy. There are bilateral pleural effusions with adjacent atelectasis. There is no pneumothorax. There is generalized volume overload. IMPRESSION: 1. Lines and tubes as above. 2. No pneumothorax. 3. Cardiomegaly with persistent bilateral pleural effusions and adjacent atelectasis. Electronically Signed   By: Constance Holster M.D.   On: 03/13/2019 08:02   Dg Chest Port 1 View  Result Date: 03/12/2019 CLINICAL DATA:  Status post surgery. EXAM: PORTABLE CHEST 1 VIEW COMPARISON:  03/11/2019 FINDINGS: Interval removal of the Swan-Ganz catheter. Right jugular central venous sheath remains in place. Left-sided chest tube in unchanged position. No left pneumothorax. Mild left basilar atelectasis. Right basilar airspace disease likely reflecting  atelectasis with a small right pleural effusion. No right pneumothorax. Stable cardiomediastinal silhouette. Recent CABG. IMPRESSION: 1. Left-sided chest tube in unchanged position. No left pneumothorax. Mild left basilar atelectasis. 2. Right basilar atelectasis with a  small right pleural effusion. Electronically Signed   By: Kathreen Devoid   On: 03/12/2019 09:11   Dg Chest Port 1 View  Result Date: 03/11/2019 CLINICAL DATA:  CABG x3 03/10/2019. EXAM: PORTABLE CHEST 1 VIEW COMPARISON:  03/10/2019 FINDINGS: Right IJ Swan-Ganz catheter unchanged with tip over the right main pulmonary artery. Left-sided chest tube unchanged. Mediastinal drain unchanged. Lungs are adequately inflated with very minimal bibasilar density likely small amount of bilateral pleural fluid/atelectasis. Mild stable cardiomegaly. Remainder of the exam is unchanged. IMPRESSION: Suggestion of small amount of bilateral pleural fluid/atelectasis. Stable cardiomegaly. Tubes and lines as described. Electronically Signed   By: Marin Olp M.D.   On: 03/11/2019 08:46   Dg Chest Port 1 View  Result Date: 03/10/2019 CLINICAL DATA:  S/p CABG x 3, chest tube in place, pt intubated. EXAM: PORTABLE CHEST 1 VIEW COMPARISON:  Chest radiograph 03/10/2019 at 1:09 p.m. FINDINGS: Stable enlarged cardiomediastinal contours status post median sternotomy. Interval intubation with endotracheal tube tip projecting between the thoracic inlet and carina. Stable remaining support apparatus including a PA catheter, enteric tube and left chest tube. The lungs are well aerated. No new focal pulmonary opacity. No pneumothorax or large pleural effusion. IMPRESSION: 1. Interval intubation with endotracheal tube tip projecting between the thoracic inlet and carina. 2. Postoperative appearance of the chest. Electronically Signed   By: Audie Pinto M.D.   On: 03/10/2019 15:08   Dg Chest Port 1v Same Day  Result Date: 03/14/2019 CLINICAL DATA:  Bedside PICC placement. EXAM: PORTABLE CHEST 1 VIEW 1:04 p.m.: COMPARISON:  Portable chest x-ray earlier same day at 5:29 a.m. and previously. FINDINGS: RIGHT arm PICC tip projects at or near the cavoatrial junction. RIGHT jugular introducer sheath tip projects at the junction of the jugular  and innominate veins. Prior sternotomy for CABG. Cardiac silhouette markedly enlarged. Normal pulmonary vascularity. Bibasilar atelectasis, unchanged since earlier in the day. Round opacity in the LEFT UPPER LOBE at the site of the prior chest tube, unchanged, presumably a small hematoma. No new pulmonary parenchymal abnormalities. IMPRESSION: 1. RIGHT arm PICC tip projects at or near the cavoatrial junction. 2. Remaining support apparatus satisfactory. 3. Stable bibasilar atelectasis. 4. Stable rounded opacity in the LEFT upper lobe at the site of the prior chest tube, presumably a small hematoma. 5. No new abnormalities. Electronically Signed   By: Evangeline Dakin M.D.   On: 03/14/2019 13:18   Vas US Doppler Pre Cabg  Result Date: 03/09/2019 PREOPERATIVE VASCULAR EVALUATION  Indications:      Pre CABG evaluation. Risk Factors:     Hypertension, hyperlipidemia, coronary artery disease. Other Factors:    Atrial fibrillation. Comparison Study: Prior study done 05/23/16 is available for comparison Performing Technologist: Birdena Crandall, [place] Technologist: Sharion Dove RVS  Examination Guidelines: A complete evaluation includes B-mode imaging, spectral Doppler, color Doppler, and power Doppler as needed of all accessible portions of each vessel. Bilateral testing is considered an integral part of a complete examination. Limited examinations for reoccurring indications may be performed as noted.  Right Carotid Findings: +----------+--------+--------+--------+--------+------------------+           PSV cm/sEDV cm/sStenosisDescribeComments           +----------+--------+--------+--------+--------+------------------+ CCA Prox  46      6  calcific                   +----------+--------+--------+--------+--------+------------------+ CCA Distal42      7                       intimal thickening +----------+--------+--------+--------+--------+------------------+ ICA Prox   46      14              calcificShadowing          +----------+--------+--------+--------+--------+------------------+ ICA Distal52      11                                         +----------+--------+--------+--------+--------+------------------+ ECA       108     11                                         +----------+--------+--------+--------+--------+------------------+ Portions of this table do not appear on this page. +----------+--------+-------+--------+------------+           PSV cm/sEDV cmsDescribeArm Pressure +----------+--------+-------+--------+------------+ Subclavian161                    145          +----------+--------+-------+--------+------------+ +---------+--------+--+--------+--+ VertebralPSV cm/s34EDV cm/s11 +---------+--------+--+--------+--+ Left Carotid Findings: +----------+--------+--------+--------+------------+------------------+           PSV cm/sEDV cm/sStenosisDescribe    Comments           +----------+--------+--------+--------+------------+------------------+ CCA Prox  55      13              homogeneous                    +----------+--------+--------+--------+------------+------------------+ CCA Distal57      12                          intimal thickening +----------+--------+--------+--------+------------+------------------+ ICA Prox  66      22              heterogenous                   +----------+--------+--------+--------+------------+------------------+ ICA Distal77      21                          tortuous           +----------+--------+--------+--------+------------+------------------+ ECA       184     19                                             +----------+--------+--------+--------+------------+------------------+ +----------+--------+--------+--------+------------+ SubclavianPSV cm/sEDV cm/sDescribeArm Pressure +----------+--------+--------+--------+------------+           220                      145          +----------+--------+--------+--------+------------+ +---------+--------+--+--------+-+ VertebralPSV cm/s33EDV cm/s7 +---------+--------+--+--------+-+  ABI Findings: +---------+------------------+-----+----------+--------+ Right    Rt Pressure (mmHg)IndexWaveform  Comment  +---------+------------------+-----+----------+--------+ Brachial 145  triphasic          +---------+------------------+-----+----------+--------+ PTA      104               0.72 monophasic         +---------+------------------+-----+----------+--------+ DP       117               0.81 monophasic         +---------+------------------+-----+----------+--------+ Great Toe76                0.52                    +---------+------------------+-----+----------+--------+ +---------+------------------+-----+---------+-------+ Left     Lt Pressure (mmHg)IndexWaveform Comment +---------+------------------+-----+---------+-------+ Brachial 145                    triphasic        +---------+------------------+-----+---------+-------+ PTA      186               1.28 biphasic         +---------+------------------+-----+---------+-------+ DP       175               1.21 biphasic         +---------+------------------+-----+---------+-------+ Great Toe118               0.81                  +---------+------------------+-----+---------+-------+ +-------+---------------+----------------+ ABI/TBIToday's ABI/TBIPrevious ABI/TBI +-------+---------------+----------------+ Right  0.81/0.52      No previous      +-------+---------------+----------------+ Left   1.28/0.81      No previous      +-------+---------------+----------------+  Right Doppler Findings: +-----------+--------+-----+---------+--------+ Site       PressureIndexDoppler  Comments +-----------+--------+-----+---------+--------+ Brachial   145           triphasic         +-----------+--------+-----+---------+--------+ Radial                  triphasic         +-----------+--------+-----+---------+--------+ Ulnar                   triphasic         +-----------+--------+-----+---------+--------+ Palmar Arch                      normal   +-----------+--------+-----+---------+--------+  Left Doppler Findings: +-----------+--------+-----+---------+--------+ Site       PressureIndexDoppler  Comments +-----------+--------+-----+---------+--------+ Brachial   145          triphasic         +-----------+--------+-----+---------+--------+ Radial                  triphasic         +-----------+--------+-----+---------+--------+ Ulnar                   triphasic         +-----------+--------+-----+---------+--------+ Palmar Arch                      normal   +-----------+--------+-----+---------+--------+  Summary: Right Carotid: Velocities in the right ICA are consistent with a 1-39% stenosis.                No change since study done 05/23/16. Left Carotid: Velocities in the left ICA are consistent with a 1-39% stenosis.  No change since study done 05/23/16. Vertebrals:  Bilateral vertebral arteries demonstrate antegrade flow. Subclavians: Normal flow hemodynamics were seen in bilateral subclavian              arteries. Right ABI: Resting right ankle-brachial index indicates mild right lower extremity arterial disease. The right toe-brachial index is abnormal. Left ABI: Resting left ankle-brachial index is within normal range. No evidence of significant left lower extremity arterial disease. The left toe-brachial index is normal. Right Upper Extremity: Doppler waveforms remain within normal limits with right radial compression. Doppler waveforms remain within normal limits with right ulnar compression. Left Upper Extremity: Doppler waveforms remain within normal limits with left radial compression. Doppler  waveforms remain within normal limits with left ulnar compression.  Electronically signed by Servando Snare MD on 03/09/2019 at 5:45:33 PM.    Final    Vas [place] Transcranial Doppler  Result Date: 03/14/2019  Transcranial Doppler Indications: ICH. History: Post op CABG 03/10/19. Hx TIA. New onset left side weakness 03/12/19. Limitations for diagnostic windows: Unable to insonate occipital window. Comparison Study: no prior Performing Technologist: June Leap RDMS, RVT  Examination Guidelines: A complete evaluation includes B-mode imaging, spectral Doppler, color Doppler, and power Doppler as needed of all accessible portions of each vessel. Bilateral testing is considered an integral part of a complete examination. Limited examinations for reoccurring indications may be performed as noted.  +----------+-------------+----------+-----------+--------------+ RIGHT TCD Right VM (cm)Depth (cm)Pulsatility   Comment     +----------+-------------+----------+-----------+--------------+ MCA           63.00                 1.18                   +----------+-------------+----------+-----------+--------------+ ACA          -28.00                 0.97                   +----------+-------------+----------+-----------+--------------+ Term ICA      56.00                   1                    +----------+-------------+----------+-----------+--------------+ PCA           18.00                 1.11                   +----------+-------------+----------+-----------+--------------+ Opthalmic     22.00                 1.52                   +----------+-------------+----------+-----------+--------------+ ICA siphon    38.00                 1.17                   +----------+-------------+----------+-----------+--------------+ Vertebral                                   not visualized +----------+-------------+----------+-----------+--------------+   +----------+------------+----------+-----------+--------------+ LEFT TCD  Left VM (cm)Depth (cm)Pulsatility   Comment     +----------+------------+----------+-----------+--------------+ MCA          67.00  0.84                   +----------+------------+----------+-----------+--------------+ ACA          -24.00                1.06                   +----------+------------+----------+-----------+--------------+ Term ICA     60.00                 1.08                   +----------+------------+----------+-----------+--------------+ PCA          44.00                 1.02                   +----------+------------+----------+-----------+--------------+ Opthalmic    19.00                 1.66                   +----------+------------+----------+-----------+--------------+ ICA siphon   38.00                 0.95                   +----------+------------+----------+-----------+--------------+ Vertebral                                  not visualized +----------+------------+----------+-----------+--------------+  +------------+-------+------------------------+             VM cm/s        Comment          +------------+-------+------------------------+ Prox Basilar-14.00 Poorly visualized window +------------+-------+------------------------+ Summary:  Normalmean flow velocities in identified vessels of anterior cerebral cirn. Suboptimal occipital window limits evaluation of posterior circulation vessels. *See table(s) above for measurements and observations.  Diagnosing physician: Antony Contras MD Electronically signed by Antony Contras MD on 03/14/2019 at 8:07:57 AM.    Final    [place] Ekg Site Rite  Result Date: 03/14/2019 If Site Rite image not attached, placement could not be confirmed due to current cardiac rhythm.   PHYSICAL EXAM   Temp:  [97.9 F (36.6 C)-99.2 F (37.3 C)] 98.2 F (36.8 C) (10/07 1200) Pulse Rate:  [64-101] 90 (10/07  1345) Resp:  [12-23] 18 (10/07 1330) BP: (88-182)/(63-112) 154/100 (10/07 1345) SpO2:  [84 %-99 %] 93 % (10/07 1345) Weight:  [131.2 kg] 131.2 kg (10/07 0444)  General - Well nourished, well developed, in no apparent distress.  Ophthalmologic - fundi not visualized due to noncooperation.  Cardiovascular - regular heart rate and rhythm today, not in afib  Mental Status -  Level of arousal and orientation to time, place, and person were intact. Language including expression, naming, repetition, comprehension was assessed and found intact. Fund of Knowledge was assessed and was intact.  Cranial Nerves II - XII - II - Visual field intact OU. III, IV, VI - Extraocular movements intact. V - Facial sensation intact bilaterally. VII - Facial movement intact bilaterally. VIII - Hearing & vestibular intact bilaterally. X - Palate elevates symmetrically. XI - Chin turning & shoulder shrug intact bilaterally. XII - Tongue protrusion intact.  Motor Strength - The patient's strength was normal in RUE and RLE, however, left UE proximal 3/5 and distal finger grip 4/5. LLE plegic. Bulk  was normal and fasciculations were absent.   Motor Tone - Muscle tone was assessed at the neck and appendages and was normal.  Reflexes - The patient's reflexes were symmetrical in all extremities except diminished LLE and he had no pathological reflexes.  Sensory - Light touch, temperature/pinprick were assessed and were symmetrical. Sensory neglect resolved on LLE today.    Coordination - The patient had normal movements in right hand with no ataxia or dysmetria.  Tremor was absent.  Gait and Station - deferred.   ASSESSMENT/PLAN Mr. JASAAN THIGPEN is a 63 y.o. male with history of A. fib on Eliquis, CAD, hypertension, hyperlipidemia, OSA on CPAP admitted for unstable angina, status post CABG 10/2.  Found to have left lower extremity weakness yesterday afternoon but worsening this morning with left lower  extremity plegic.  No tPA given due to outside window and recent surgery.    Stroke:  right posterior ACA distal infarct, etiology uncertain, could be A. fib while holding on AC versus intracranial atherosclerosis in the setting of low BP  Resultant left arm weakness and left leg plegic  MRI not able to perform due to recent CABG procedure  CT head showed right distal ACA infarct  CTA head and neck No ELVO. New R ACA A1 partially calcified clot. L V4 opacifiecation. B fetal origin PCAs w/ small VB system.   Repeat CT 10/6 stable small right ACA infarct  Carotid Doppler 10/1 bilateral ICA 1 to 39% stenosis  TEE 10/2 EF 55 to 60%, no PFO  TCD showed no proximal intracranial large vessel stenosis  LDL 85  HgbA1c 5.6  Lovenox for VTE prophylaxis  Eliquis (apixaban) daily prior to admission, now on aspirin 81 mg daily. Start heparin IV 10/6 per stroke protocol, so far tolerating well, can transition back to Eliquis whenever appropriate from cardiology standpoint.  Therapy recommendations: CIR  Disposition: Pending  Hx of hypertension Hypotension  BP low at 70s-90s on 10/4 from midnight to morning, now much improved  Pt on cardizem drip, milrinone drip, metoprolol po which all have impact on low BP  Yesterday BP 110-120s  Avoid low BP  BP goal 110-140 to maintain cerebral perfusion  Now BP 130-160s  afib RVR  Cardiology on board  On amiodarone, cardizem and metoprolol  eliquis on hold for now  On heparin IV 10/6 per stroke protocol, so far tolerating well, can transition back to Eliquis once ready from cardiology standpoint.  Unstable angina / CAD s/p CABG  CABG 10/2  CTS and cardiology on board  On ASA 81 and lipitor 80  AKI   Cre 0.88->2.21->1.33 ->0.97   On IVF  Manage per primary team  Hyperlipidemia  Home meds:  lipitor   LDL 85, goal < 70  Now on lipitor 80  Continue statin at discharge  Other Stroke Risk Factors  Advanced  age  Morbid Obesity, Body mass index is 43.98 kg/m.   Hx stroke/TIA  Obstructive sleep apnea, on CPAP at home  Other Active Problems  Hyponatremia - Na 129->129->126    Leukocytosis WBC 12.8->10.8->10.4    Thrombocytopenia, resolved - Platelet 127->163->167   Hospital day # 8  I had long discussion with pt and wife at bedside, updated pt current condition, treatment plan and potential prognosis, and answered all the questions. They expressed understanding and appreciation.   Neurology will sign off. Please call with questions. Pt will follow up with stroke clinic NP at [facility] in about 4 weeks. Thanks for the consult.  Rosalin Hawking, MD PhD Stroke Neurology 03/15/2019 2:03 PM    To contact Stroke Continuity provider, please refer to [email or web address] After hours, contact General Neurology

## 2019-03-15 NOTE — Progress Notes (Signed)
Spoke with MD about concern for potential ileus. No BM yet post-op. Belly distended, patient now nauseous despite PRN zofran. RN instructed to monitor and insert NG tube if patient vomits.

## 2019-03-15 NOTE — Progress Notes (Signed)
Progress Note  Patient Name: Martin [name] Date of Encounter: 03/15/2019  Primary Cardiologist: Kate Sable, MD   Subjective  O/N events: AKI completely resolved. CT shows no hemorrhagic conversion. Heparin restarted  Martin [name] was examined and evaluated at bedside this AM. He was observed eating breakfast in bed. He mentions endorsing some chest pain w/ deep breaths. Still unable to move his left leg. No other complaints.  Inpatient Medications    Scheduled Meds:  acetaminophen  1,000 mg Oral Q6H   Or   acetaminophen (TYLENOL) oral liquid 160 mg/5 mL  1,000 mg Per Tube Q6H   allopurinol  300 mg Oral q morning - 10a   aspirin EC  81 mg Oral Daily   atorvastatin  80 mg Oral q1800   bisacodyl  10 mg Oral Daily   Or   bisacodyl  10 mg Rectal Daily   Chlorhexidine Gluconate Cloth  6 each Topical Q0600   docusate sodium  200 mg Oral Daily   fluticasone  1 spray Each Nare Daily   folic acid  1 mg Oral Daily   furosemide  80 mg Intravenous Daily   guaiFENesin  600 mg Oral BID   insulin aspart  0-24 Units Subcutaneous TID AC & HS   insulin detemir  10 Units Subcutaneous Daily   metoprolol tartrate  12.5 mg Oral BID   Or   metoprolol tartrate  12.5 mg Per Tube BID   mometasone-formoterol  2 puff Inhalation BID   pantoprazole  40 mg Oral Daily   sodium chloride flush  10-40 mL Intracatheter Q12H   sodium chloride flush  3 mL Intravenous Q12H   thiamine  100 mg Intravenous Daily   vitamin B-12  100 mcg Oral Daily   Continuous Infusions:  sodium chloride Stopped (03/11/19 1110)   sodium chloride     sodium chloride 10 mL/hr at 03/10/19 1428   amiodarone 30 mg/hr (03/14/19 0700)   heparin 1,500 Units/hr (03/15/19 0224)   milrinone 0.125 mcg/kg/min (03/15/19 0225)   phenylephrine (NEO-SYNEPHRINE) Adult infusion 5 mcg/min (03/14/19 0132)   PRN Meds: sodium chloride, ALPRAZolam, ipratropium-albuterol, metoprolol tartrate, midazolam,  morphine injection, ondansetron (ZOFRAN) IV, oxyCODONE, sodium chloride flush, sodium chloride flush, traMADol   Vital Signs    Vitals:   03/15/19 0444 03/15/19 0500 03/15/19 0600 03/15/19 0700  BP:  123/85 (!) 164/101   Pulse:  77 85 78  Resp:  14 20 (!) 21  Temp:      TempSrc:      SpO2:  93% (!) 89% (!) 89%  Weight: 131.2 kg     Height:        Intake/Output Summary (Last 24 hours) at 03/15/2019 0725 Last data filed at 03/15/2019 0700 Gross per 24 hour  Intake 1141.94 ml  Output 2250 ml  Net -1108.06 ml   Filed Weights   03/13/19 0500 03/14/19 0500 03/15/19 0444  Weight: 127.8 kg 130.6 kg 131.2 kg    Telemetry    Irregularly irregular but no RVR - Personally Reviewed  ECG    None from this am - Personally Reviewed  Physical Exam   Gen: Well-developed, well nourished, NAD HEENT: NCAT head, hearing intact, EOMI, CPAP in place and functioning Neck: supple, ROM intact, no JVD CV: RRR, S1, S2 normal, No rubs, no murmurs, no gallops Pulm: Expiratory wheezes Extm: Peripheral pulses intact, trace pitting edema Skin: Dry, Warm, normal turgor  Neuro: AAOx2, LLE strength 1/5, RLE strength 3/5, RUE strength 5/5, LUE strength 4/5  Labs    Chemistry Recent Labs  Lab 03/13/19 0435 03/14/19 0449 03/15/19 0437  NA 129* 129* 126*  K 3.8 3.8 3.8  CL 94* 92* 92*  CO2 25 25 23   GLUCOSE 134* 158* 142*  BUN 36* 35* 31*  CREATININE 2.21* 1.33* 0.97  CALCIUM 8.3* 8.7* 8.5*  PROT 5.4* 5.4* 5.7*  ALBUMIN 2.7* 2.4* 2.3*  AST 26 26 31   ALT 22 23 29   ALKPHOS 53 73 126  BILITOT 0.8 1.0 1.4*  GFRNONAA 30* 56* >60  GFRAA 35* >60 >60  ANIONGAP 10 12 11      Hematology Recent Labs  Lab 03/13/19 0435 03/14/19 0449 03/15/19 0437  WBC 12.8* 10.8* 10.4  RBC 3.27* 3.26* 3.10*  HGB 11.0* 11.0* 10.6*  HCT 32.3* 31.9* 30.6*  MCV 98.8 97.9 98.7  MCH 33.6 33.7 34.2*  MCHC 34.1 34.5 34.6  RDW 13.2 13.2 13.3  PLT 127* 163 167    Cardiac EnzymesNo results for input(s):  TROPONINI in the last 168 hours. No results for input(s): TROPIPOC in the last 168 hours.   BNP No results for input(s): BNP, PROBNP in the last 168 hours.   DDimer No results for input(s): DDIMER in the last 168 hours.   Radiology    Ct Angio Head W Or Wo Contrast  Result Date: 03/14/2019 CLINICAL DATA:  Anterior cerebral artery territory infarct follow-up EXAM: CT ANGIOGRAPHY HEAD AND NECK TECHNIQUE: Multidetector CT imaging of the head and neck was performed using the standard protocol during bolus administration of intravenous contrast. Multiplanar CT image reconstructions and MIPs were obtained to evaluate the vascular anatomy. Carotid stenosis measurements (when applicable) are obtained utilizing NASCET criteria, using the distal internal carotid diameter as the denominator. CONTRAST:  61mL OMNIPAQUE IOHEXOL 350 MG/ML SOLN COMPARISON:  CTA head neck 10/23/2017 Head CT 03/13/2019 FINDINGS: CTA NECK FINDINGS SKELETON: There is no bony spinal canal stenosis. No lytic or blastic lesion. OTHER NECK: Status post recent coronary artery bypass graft with median sternotomy. There is a retrosternal fluid collection that measures 4.3 x 2.0 x 5.2 cm. UPPER CHEST: Bilateral pleural effusions. There is a large loculated component of the left effusion. AORTIC ARCH: There is moderate calcific atherosclerosis of the aortic arch. There is no aneurysm, dissection or hemodynamically significant stenosis of the visualized portion of the aorta. Normal variant aortic arch branching pattern with the brachiocephalic and left common carotid arteries sharing a common origin. The visualized proximal subclavian arteries are widely patent. RIGHT CAROTID SYSTEM: There is mild narrowing of the proximal common carotid artery due to noncalcified plaque. There is circumferential atherosclerotic calcification at the carotid bifurcation and proximal internal carotid artery resulting in less than 50% stenosis. The distal internal  carotid artery is widely patent. Proximal external carotid artery is normal. LEFT CAROTID SYSTEM: Mild common carotid atherosclerosis. Circumferential calcification of the proximal ICA without hemodynamically significant stenosis. Normal distal ICA aside from minor calcification. There is mild narrowing of the origin of the external carotid artery due to noncalcified plaque. VERTEBRAL ARTERIES: Right dominant configuration. Poor visualization of the right origin. Left origin is patent. There is no dissection, occlusion or flow-limiting stenosis to the skull base (V1-V3 segments). CTA HEAD FINDINGS POSTERIOR CIRCULATION: --Vertebral arteries: Normal right V4 segment. There is loss of opacification of the left V4 segment distal to the PICA origin. --Posterior inferior cerebellar arteries (PICA): Patent origins from the vertebral arteries. --Anterior inferior cerebellar arteries (AICA): Right dominant --Basilar artery: Diminutive --Superior cerebellar arteries: Normal. --Posterior cerebral arteries:  Normal. Both are predominantly supplied by the posterior communicating arteries (p-comm). ANTERIOR CIRCULATION: --Intracranial internal carotid arteries: Atherosclerotic calcification of the internal carotid arteries at the skull base without hemodynamically significant stenosis. --Anterior cerebral arteries (ACA): There is a new focus of calcification in the distal right A2 segment (series 12, image 22) that may indicate partially calcified thrombus. However, the remainder of the vessel is normally opacified. Calcification in the left A2 segment is unchanged. Both A1 segments are present. Patent anterior communicating artery (a-comm). --Middle cerebral arteries (MCA): Normal. VENOUS SINUSES: As permitted by contrast timing, patent. ANATOMIC VARIANTS: Bilateral fetal origins of the posterior cerebral arteries. Review of the MIP images confirms the above findings. IMPRESSION: 1. No emergent large vessel occlusion or  hemodynamically significant stenosis. 2. New focus of calcification in the right anterior cerebral artery distal A2 segment (s12:i22), which may indicate partially calcified thrombus. The vessel is patent distal to this, however. 3. Decreased opacification of the distal V4 segment of the left vertebral artery, distal to the PICA origin, age-indeterminate. Bilateral fetal origins of the posterior cerebral arteries with diminutive vertebrobasilar system. 4. Status post recent coronary artery bypass graft with retrosternal fluid collection that measures 4.3 x 2.0 x 5.2 cm. In the immediate postoperative setting, this is favored to be a seroma. 5. Bilateral pleural effusions with large loculated component of the left effusion. 6. Aortic Atherosclerosis (ICD10-I70.0). Electronically Signed   By: Ulyses Jarred M.D.   On: 03/14/2019 19:36   Ct Head Wo Contrast  Result Date: 03/14/2019 CLINICAL DATA:  Stroke, follow-up. EXAM: CT HEAD WITHOUT CONTRAST TECHNIQUE: Contiguous axial images were obtained from the base of the skull through the vertex without intravenous contrast. COMPARISON:  CT head without contrast 03/13/2019 FINDINGS: Brain: Progressive hypoattenuation is noted within the right cingulate gyrus. Subjacent white matter hypoattenuation is progressed as well. No acute hemorrhage is noted. Precentral gyrus is intact. Periventricular white matter changes are otherwise stable. No significant extraaxial fluid collection is present. The brainstem and cerebellum are within normal limits. Vascular: Atherosclerotic changes are noted within the cavernous internal carotid arteries. There is no hyperdense vessel. Skull: Calvarium is intact. No focal lytic or blastic lesions are present. Sinuses/Orbits: Mild mucosal thickening is present within ethmoid air cells. No fluid levels are present. The globes and orbits are within normal limits. IMPRESSION: 1. Progressive right ACA territory nonhemorrhagic infarct. 2. Progressive  adjacent white matter infarct. 3. Atherosclerosis. 4. Diffuse small vessel disease. Electronically Signed   By: San Morelle M.D.   On: 03/14/2019 12:12   Ct Angio Neck W Or Wo Contrast  Result Date: 03/14/2019 CLINICAL DATA:  Anterior cerebral artery territory infarct follow-up EXAM: CT ANGIOGRAPHY HEAD AND NECK TECHNIQUE: Multidetector CT imaging of the head and neck was performed using the standard protocol during bolus administration of intravenous contrast. Multiplanar CT image reconstructions and MIPs were obtained to evaluate the vascular anatomy. Carotid stenosis measurements (when applicable) are obtained utilizing NASCET criteria, using the distal internal carotid diameter as the denominator. CONTRAST:  71mL OMNIPAQUE IOHEXOL 350 MG/ML SOLN COMPARISON:  CTA head neck 10/23/2017 Head CT 03/13/2019 FINDINGS: CTA NECK FINDINGS SKELETON: There is no bony spinal canal stenosis. No lytic or blastic lesion. OTHER NECK: Status post recent coronary artery bypass graft with median sternotomy. There is a retrosternal fluid collection that measures 4.3 x 2.0 x 5.2 cm. UPPER CHEST: Bilateral pleural effusions. There is a large loculated component of the left effusion. AORTIC ARCH: There is moderate calcific atherosclerosis of the aortic  arch. There is no aneurysm, dissection or hemodynamically significant stenosis of the visualized portion of the aorta. Normal variant aortic arch branching pattern with the brachiocephalic and left common carotid arteries sharing a common origin. The visualized proximal subclavian arteries are widely patent. RIGHT CAROTID SYSTEM: There is mild narrowing of the proximal common carotid artery due to noncalcified plaque. There is circumferential atherosclerotic calcification at the carotid bifurcation and proximal internal carotid artery resulting in less than 50% stenosis. The distal internal carotid artery is widely patent. Proximal external carotid artery is normal. LEFT  CAROTID SYSTEM: Mild common carotid atherosclerosis. Circumferential calcification of the proximal ICA without hemodynamically significant stenosis. Normal distal ICA aside from minor calcification. There is mild narrowing of the origin of the external carotid artery due to noncalcified plaque. VERTEBRAL ARTERIES: Right dominant configuration. Poor visualization of the right origin. Left origin is patent. There is no dissection, occlusion or flow-limiting stenosis to the skull base (V1-V3 segments). CTA HEAD FINDINGS POSTERIOR CIRCULATION: --Vertebral arteries: Normal right V4 segment. There is loss of opacification of the left V4 segment distal to the PICA origin. --Posterior inferior cerebellar arteries (PICA): Patent origins from the vertebral arteries. --Anterior inferior cerebellar arteries (AICA): Right dominant --Basilar artery: Diminutive --Superior cerebellar arteries: Normal. --Posterior cerebral arteries: Normal. Both are predominantly supplied by the posterior communicating arteries (p-comm). ANTERIOR CIRCULATION: --Intracranial internal carotid arteries: Atherosclerotic calcification of the internal carotid arteries at the skull base without hemodynamically significant stenosis. --Anterior cerebral arteries (ACA): There is a new focus of calcification in the distal right A2 segment (series 12, image 22) that may indicate partially calcified thrombus. However, the remainder of the vessel is normally opacified. Calcification in the left A2 segment is unchanged. Both A1 segments are present. Patent anterior communicating artery (a-comm). --Middle cerebral arteries (MCA): Normal. VENOUS SINUSES: As permitted by contrast timing, patent. ANATOMIC VARIANTS: Bilateral fetal origins of the posterior cerebral arteries. Review of the MIP images confirms the above findings. IMPRESSION: 1. No emergent large vessel occlusion or hemodynamically significant stenosis. 2. New focus of calcification in the right anterior  cerebral artery distal A2 segment (s12:i22), which may indicate partially calcified thrombus. The vessel is patent distal to this, however. 3. Decreased opacification of the distal V4 segment of the left vertebral artery, distal to the PICA origin, age-indeterminate. Bilateral fetal origins of the posterior cerebral arteries with diminutive vertebrobasilar system. 4. Status post recent coronary artery bypass graft with retrosternal fluid collection that measures 4.3 x 2.0 x 5.2 cm. In the immediate postoperative setting, this is favored to be a seroma. 5. Bilateral pleural effusions with large loculated component of the left effusion. 6. Aortic Atherosclerosis (ICD10-I70.0). Electronically Signed   By: Ulyses Jarred M.D.   On: 03/14/2019 19:36   Dg Chest Port 1 View  Result Date: 03/14/2019 CLINICAL DATA:  Short of breath.  Status post CABG surgery. EXAM: PORTABLE CHEST 1 VIEW COMPARISON:  03/13/2019 and earlier exams. FINDINGS: Left chest tube has been removed. There is opacity corresponding to the chest tube tract consistent with fluid/atelectasis. No pneumothorax. Persistent opacity noted at the bases consistent with a combination of pleural effusions and atelectasis. No convincing pulmonary edema. No mediastinal widening.  Cardiac silhouette is enlarged but stable. Stable right internal jugular Cordis. IMPRESSION: 1. Status post removal of the left chest tube.  No pneumothorax. 2. Persistent lung base opacity consistent with a combination of pleural effusions and atelectasis. Electronically Signed   By: Lajean Manes M.D.   On: 03/14/2019 08:23  Dg Chest Port 1v Same Day  Result Date: 03/14/2019 CLINICAL DATA:  Bedside PICC placement. EXAM: PORTABLE CHEST 1 VIEW 1:04 p.m.: COMPARISON:  Portable chest x-ray earlier same day at 5:29 a.m. and previously. FINDINGS: RIGHT arm PICC tip projects at or near the cavoatrial junction. RIGHT jugular introducer sheath tip projects at the junction of the jugular and  innominate veins. Prior sternotomy for CABG. Cardiac silhouette markedly enlarged. Normal pulmonary vascularity. Bibasilar atelectasis, unchanged since earlier in the day. Round opacity in the LEFT UPPER LOBE at the site of the prior chest tube, unchanged, presumably a small hematoma. No new pulmonary parenchymal abnormalities. IMPRESSION: 1. RIGHT arm PICC tip projects at or near the cavoatrial junction. 2. Remaining support apparatus satisfactory. 3. Stable bibasilar atelectasis. 4. Stable rounded opacity in the LEFT upper lobe at the site of the prior chest tube, presumably a small hematoma. 5. No new abnormalities. Electronically Signed   By: Evangeline Dakin M.D.   On: 03/14/2019 13:18   Vas [place] Transcranial Doppler  Result Date: 03/14/2019  Transcranial Doppler Indications: ICH. History: Post op CABG 03/10/19. Hx TIA. New onset left side weakness 03/12/19. Limitations for diagnostic windows: Unable to insonate occipital window. Comparison Study: no prior Performing Technologist: June Leap RDMS, RVT  Examination Guidelines: A complete evaluation includes B-mode imaging, spectral Doppler, color Doppler, and power Doppler as needed of all accessible portions of each vessel. Bilateral testing is considered an integral part of a complete examination. Limited examinations for reoccurring indications may be performed as noted.  +----------+-------------+----------+-----------+--------------+  RIGHT TCD  Right VM (cm) Depth (cm) Pulsatility    Comment      +----------+-------------+----------+-----------+--------------+  MCA            63.00                   1.18                     +----------+-------------+----------+-----------+--------------+  ACA           -28.00                   0.97                     +----------+-------------+----------+-----------+--------------+  Term ICA       56.00                     1                      +----------+-------------+----------+-----------+--------------+  PCA             18.00                   1.11                     +----------+-------------+----------+-----------+--------------+  Opthalmic      22.00                   1.52                     +----------+-------------+----------+-----------+--------------+  ICA siphon     38.00                   1.17                     +----------+-------------+----------+-----------+--------------+  Vertebral  not visualized  +----------+-------------+----------+-----------+--------------+  +----------+------------+----------+-----------+--------------+  LEFT TCD   Left VM (cm) Depth (cm) Pulsatility    Comment      +----------+------------+----------+-----------+--------------+  MCA           67.00                   0.84                     +----------+------------+----------+-----------+--------------+  ACA           -24.00                  1.06                     +----------+------------+----------+-----------+--------------+  Term ICA      60.00                   1.08                     +----------+------------+----------+-----------+--------------+  PCA           44.00                   1.02                     +----------+------------+----------+-----------+--------------+  Opthalmic     19.00                   1.66                     +----------+------------+----------+-----------+--------------+  ICA siphon    38.00                   0.95                     +----------+------------+----------+-----------+--------------+  Vertebral                                      not visualized  +----------+------------+----------+-----------+--------------+  +------------+-------+------------------------+               VM cm/s         Comment           +------------+-------+------------------------+  Prox Basilar -14.00  Poorly visualized window  +------------+-------+------------------------+ Summary:  Normalmean flow velocities in identified vessels of anterior cerebral cirn. Suboptimal occipital  window limits evaluation of posterior circulation vessels. *See table(s) above for measurements and observations.  Diagnosing physician: Antony Contras MD Electronically signed by Antony Contras MD on 03/14/2019 at 8:07:57 AM.    Final    [place] Ekg Site Rite  Result Date: 03/14/2019 If Site Rite image not attached, placement could not be confirmed due to current cardiac rhythm.   Cardiac Studies   Echo 03/08/19: IMPRESSIONS  1. Left ventricular ejection fraction, by visual estimation, is 55 to 60%. The left ventricle has normal function. Normal left ventricular size. There is mildly increased left ventricular hypertrophy. Basal inferior akinesis. 2. Left ventricular diastolic Doppler parameters are consistent with impaired relaxation pattern of LV diastolic filling. 3. Global right ventricle has normal systolic function.The right ventricular size is normal. No increase in right ventricular wall thickness. 4. The aortic valve is tricuspid Aortic valve regurgitation is trivial by color flow Doppler. Mild aortic valve stenosis. Mean gradient 10 mmHg. 5. There is  mild dilatation of the ascending aorta measuring 42 mm. 6. Left atrial size was moderately dilated. 7. Right atrial size was mildly dilated. 8. The tricuspid valve is normal in structure. Tricuspid valve regurgitation was not visualized by color flow Doppler. 9. The mitral valve is normal in structure. No evidence of mitral valve regurgitation. No evidence of mitral stenosis. 10. TR signal is inadequate for assessing pulmonary artery systolic pressure. 11. The inferior vena cava is normal in size with greater than 50% respiratory variability, suggesting right atrial pressure of 3 mmHg.  Carotid Dopplers 03/09/2019: 1 to 39% ICA stenosis bilaterally.  Left heart cath 03/08/2019:  Severe three-vessel coronary calcification  Patent left main  Eccentric ostial 50 to 75% LAD with mid vessel 40 to 50% narrowing. Mid to distal LAD  stent is widely patent.  Ramus intermedius contains proximal 90% stenosis.  Circumflex contains 99% proximal/ostial stenosis.  RCA is heavily calcified and stented. The artery is totally occluded in the proximal to mid segment. Left-to-right collaterals are noted.  Mildly reduced LV systolic function with EF estimated to be 40%. LVEDP is 28 mmHg. Findings compatible with acute on chronic combined   Patient Profile     Martin [name] is a 63 yo M w/ PMH of prior CAD s/p pCI, PAF, HTN, OSA on CPAP presented with chest pain, found to have NSTEMI w/ multivessel dz requiring CABG. Hospital course complicated by acute ACA stroke.  Assessment & Plan    Acute ACA stroke Found with left sided weakness. Not candidate for MRI due to leads. CT head showing distal right ACA. Repeat CT showing no hemorrhagic conversion. CTA shows distal A2 thrombus. - Appreciate neuro recs: ok to restart heparin - C/w heparin gtt - CIR per PT/OT - Need speech/swallow prior to starting oral anticoagulation  NSTEMI Multi-vessel CAD Cath with multi-vessel dz. S/p CABG post op day 4. No significant chest pain per pt. Post-op Echo w/ no significant different. Some pitting edema on exam. - C/w asa, atorvastatin - Start DAPT when cleared by CT surgery.  Diastolic heart failure Pitting edema on exam. TEE post Cabg w/ preserved EF. Volume up since admission. AKI resolved. 2L output yesterday. - C/w furosemide 80 daily - Daily weights, strict I&Os  PAF Developed A.fib w/ RVR couple days ago. On amio. Still irregular but no longer RVR this am. Dilt stopped overnight - C/w amio, metoprolol - C/w heparin gtt   For questions or updates, please contact [place] Please consult [email or web address] for contact info under Cardiology/STEMI.   Signed, Gilberto Better, MD PGY-2, [facility] Pager: [phone] 03/15/2019, 7:25 AM    Attending attestation to follow

## 2019-03-15 NOTE — Progress Notes (Addendum)
[facility] DAILY ICU PROGRESS NOTE                   [place].[address]            [employer] [address]          [phone]   5 Days Post-Op Procedure(s) (LRB): CORONARY ARTERY BYPASS GRAFTING (CABG)X 3 Using Left Internal Mammary Artery and Right Saphenous Vein Grafts (N/A) TRANSESOPHAGEAL ECHOCARDIOGRAM (TEE) (N/A)  Total Length of Stay:  LOS: 8 days   Subjective: Patient sat upright on side of bed and is now back in bed as needs new IV placed  Objective: Vital signs in last 24 hours: Temp:  [97.9 F (36.6 C)-98.6 F (37 C)] 98.2 F (36.8 C) (10/07 0800) Pulse Rate:  [64-94] 83 (10/07 1015) Cardiac Rhythm: Atrial fibrillation;Normal sinus rhythm (10/07 0800) Resp:  [12-24] 23 (10/07 1015) BP: (88-182)/(63-106) 164/95 (10/07 1015) SpO2:  [84 %-99 %] 91 % (10/07 1015) Weight:  [131.2 kg] 131.2 kg (10/07 0444)  Filed Weights   03/13/19 0500 03/14/19 0500 03/15/19 0444  Weight: 127.8 kg 130.6 kg 131.2 kg   Weight change: 0.6 kg       Intake/Output from previous day: 10/06 0701 - 10/07 0700 In: 1141.9 [P.O.:240; I.V.:901.9] Out: 2250 [Urine:2250]  Intake/Output this shift: Total I/O In: 418.1 [P.O.:300; I.V.:118.1] Out: 225 [Urine:225]  Current Meds: Scheduled Meds:  acetaminophen  1,000 mg Oral Q6H   Or   acetaminophen (TYLENOL) oral liquid 160 mg/5 mL  1,000 mg Per Tube Q6H   allopurinol  300 mg Oral q morning - 10a   aspirin EC  81 mg Oral Daily   atorvastatin  80 mg Oral q1800   bisacodyl  10 mg Oral Daily   Or   bisacodyl  10 mg Rectal Daily   Chlorhexidine Gluconate Cloth  6 each Topical Q0600   docusate sodium  200 mg Oral Daily   fluticasone  1 spray Each Nare Daily   folic acid  1 mg Oral Daily   furosemide  80 mg Intravenous BID   guaiFENesin  600 mg Oral BID   insulin aspart  0-24 Units Subcutaneous TID AC & HS   insulin detemir  10 Units Subcutaneous Daily   metoprolol tartrate  25 mg Oral BID   mometasone-formoterol   2 puff Inhalation BID   pantoprazole  40 mg Oral Daily   sodium chloride flush  10-40 mL Intracatheter Q12H   sodium chloride flush  3 mL Intravenous Q12H   thiamine  100 mg Intravenous Daily   vitamin B-12  100 mcg Oral Daily   Continuous Infusions:  sodium chloride Stopped (03/11/19 1110)   sodium chloride     sodium chloride 10 mL/hr at 03/10/19 1428   amiodarone 30 mg/hr (03/14/19 0700)   heparin 1,500 Units/hr (03/15/19 0224)   PRN Meds:.sodium chloride, ALPRAZolam, ipratropium-albuterol, midazolam, morphine injection, ondansetron (ZOFRAN) IV, oxyCODONE, sodium chloride flush, sodium chloride flush, traMADol  General appearance: alert, cooperative and no distress Neurologic: left sided weakness. He is able to move left arm and pick it up a little but not able to move left leg (LLE plegia) Heart: RRR Lungs: Diminished bibasilar breath sounds L>R Abdomen: Semi soft, obese, non tender, sporadic bowel sounds Extremities: SCDs in place Wounds: Clean and dry  Lab Results: CBC: Recent Labs    03/14/19 0449 03/15/19 0437  WBC 10.8* 10.4  HGB 11.0* 10.6*  HCT 31.9* 30.6*  PLT 163 167   BMET:  Recent Labs    03/14/19 0449 03/15/19 0437  NA 129* 126*  K 3.8 3.8  CL 92* 92*  CO2 25 23  GLUCOSE 158* 142*  BUN 35* 31*  CREATININE 1.33* 0.97  CALCIUM 8.7* 8.5*    CMET: Lab Results  Component Value Date   WBC 10.4 03/15/2019   HGB 10.6 (L) 03/15/2019   HCT 30.6 (L) 03/15/2019   PLT 167 03/15/2019   GLUCOSE 142 (H) 03/15/2019   CHOL 170 03/07/2019   TRIG 218 (H) 03/07/2019   HDL 41 03/07/2019   LDLCALC 85 03/07/2019   ALT 29 03/15/2019   AST 31 03/15/2019   NA 126 (L) 03/15/2019   K 3.8 03/15/2019   CL 92 (L) 03/15/2019   CREATININE 0.97 03/15/2019   BUN 31 (H) 03/15/2019   CO2 23 03/15/2019   TSH 10.690 (H) 03/12/2019   INR 1.4 (H) 03/10/2019   HGBA1C 5.6 03/09/2019      PT/INR: No results for input(s): LABPROT, INR in the last 72  hours. Radiology: Ct Angio Head W Or Wo Contrast  Result Date: 03/14/2019 CLINICAL DATA:  Anterior cerebral artery territory infarct follow-up EXAM: CT ANGIOGRAPHY HEAD AND NECK TECHNIQUE: Multidetector CT imaging of the head and neck was performed using the standard protocol during bolus administration of intravenous contrast. Multiplanar CT image reconstructions and MIPs were obtained to evaluate the vascular anatomy. Carotid stenosis measurements (when applicable) are obtained utilizing NASCET criteria, using the distal internal carotid diameter as the denominator. CONTRAST:  22mL OMNIPAQUE IOHEXOL 350 MG/ML SOLN COMPARISON:  CTA head neck 10/23/2017 Head CT 03/13/2019 FINDINGS: CTA NECK FINDINGS SKELETON: There is no bony spinal canal stenosis. No lytic or blastic lesion. OTHER NECK: Status post recent coronary artery bypass graft with median sternotomy. There is a retrosternal fluid collection that measures 4.3 x 2.0 x 5.2 cm. UPPER CHEST: Bilateral pleural effusions. There is a large loculated component of the left effusion. AORTIC ARCH: There is moderate calcific atherosclerosis of the aortic arch. There is no aneurysm, dissection or hemodynamically significant stenosis of the visualized portion of the aorta. Normal variant aortic arch branching pattern with the brachiocephalic and left common carotid arteries sharing a common origin. The visualized proximal subclavian arteries are widely patent. RIGHT CAROTID SYSTEM: There is mild narrowing of the proximal common carotid artery due to noncalcified plaque. There is circumferential atherosclerotic calcification at the carotid bifurcation and proximal internal carotid artery resulting in less than 50% stenosis. The distal internal carotid artery is widely patent. Proximal external carotid artery is normal. LEFT CAROTID SYSTEM: Mild common carotid atherosclerosis. Circumferential calcification of the proximal ICA without hemodynamically significant stenosis.  Normal distal ICA aside from minor calcification. There is mild narrowing of the origin of the external carotid artery due to noncalcified plaque. VERTEBRAL ARTERIES: Right dominant configuration. Poor visualization of the right origin. Left origin is patent. There is no dissection, occlusion or flow-limiting stenosis to the skull base (V1-V3 segments). CTA HEAD FINDINGS POSTERIOR CIRCULATION: --Vertebral arteries: Normal right V4 segment. There is loss of opacification of the left V4 segment distal to the PICA origin. --Posterior inferior cerebellar arteries (PICA): Patent origins from the vertebral arteries. --Anterior inferior cerebellar arteries (AICA): Right dominant --Basilar artery: Diminutive --Superior cerebellar arteries: Normal. --Posterior cerebral arteries: Normal. Both are predominantly supplied by the posterior communicating arteries (p-comm). ANTERIOR CIRCULATION: --Intracranial internal carotid arteries: Atherosclerotic calcification of the internal carotid arteries at the skull base without hemodynamically significant stenosis. --Anterior cerebral arteries (ACA): There is a  new focus of calcification in the distal right A2 segment (series 12, image 22) that may indicate partially calcified thrombus. However, the remainder of the vessel is normally opacified. Calcification in the left A2 segment is unchanged. Both A1 segments are present. Patent anterior communicating artery (a-comm). --Middle cerebral arteries (MCA): Normal. VENOUS SINUSES: As permitted by contrast timing, patent. ANATOMIC VARIANTS: Bilateral fetal origins of the posterior cerebral arteries. Review of the MIP images confirms the above findings. IMPRESSION: 1. No emergent large vessel occlusion or hemodynamically significant stenosis. 2. New focus of calcification in the right anterior cerebral artery distal A2 segment (s12:i22), which may indicate partially calcified thrombus. The vessel is patent distal to this, however. 3.  Decreased opacification of the distal V4 segment of the left vertebral artery, distal to the PICA origin, age-indeterminate. Bilateral fetal origins of the posterior cerebral arteries with diminutive vertebrobasilar system. 4. Status post recent coronary artery bypass graft with retrosternal fluid collection that measures 4.3 x 2.0 x 5.2 cm. In the immediate postoperative setting, this is favored to be a seroma. 5. Bilateral pleural effusions with large loculated component of the left effusion. 6. Aortic Atherosclerosis (ICD10-I70.0). Electronically Signed   By: Ulyses Jarred M.D.   On: 03/14/2019 19:36   Ct Angio Neck W Or Wo Contrast  Result Date: 03/14/2019 CLINICAL DATA:  Anterior cerebral artery territory infarct follow-up EXAM: CT ANGIOGRAPHY HEAD AND NECK TECHNIQUE: Multidetector CT imaging of the head and neck was performed using the standard protocol during bolus administration of intravenous contrast. Multiplanar CT image reconstructions and MIPs were obtained to evaluate the vascular anatomy. Carotid stenosis measurements (when applicable) are obtained utilizing NASCET criteria, using the distal internal carotid diameter as the denominator. CONTRAST:  90mL OMNIPAQUE IOHEXOL 350 MG/ML SOLN COMPARISON:  CTA head neck 10/23/2017 Head CT 03/13/2019 FINDINGS: CTA NECK FINDINGS SKELETON: There is no bony spinal canal stenosis. No lytic or blastic lesion. OTHER NECK: Status post recent coronary artery bypass graft with median sternotomy. There is a retrosternal fluid collection that measures 4.3 x 2.0 x 5.2 cm. UPPER CHEST: Bilateral pleural effusions. There is a large loculated component of the left effusion. AORTIC ARCH: There is moderate calcific atherosclerosis of the aortic arch. There is no aneurysm, dissection or hemodynamically significant stenosis of the visualized portion of the aorta. Normal variant aortic arch branching pattern with the brachiocephalic and left common carotid arteries sharing a  common origin. The visualized proximal subclavian arteries are widely patent. RIGHT CAROTID SYSTEM: There is mild narrowing of the proximal common carotid artery due to noncalcified plaque. There is circumferential atherosclerotic calcification at the carotid bifurcation and proximal internal carotid artery resulting in less than 50% stenosis. The distal internal carotid artery is widely patent. Proximal external carotid artery is normal. LEFT CAROTID SYSTEM: Mild common carotid atherosclerosis. Circumferential calcification of the proximal ICA without hemodynamically significant stenosis. Normal distal ICA aside from minor calcification. There is mild narrowing of the origin of the external carotid artery due to noncalcified plaque. VERTEBRAL ARTERIES: Right dominant configuration. Poor visualization of the right origin. Left origin is patent. There is no dissection, occlusion or flow-limiting stenosis to the skull base (V1-V3 segments). CTA HEAD FINDINGS POSTERIOR CIRCULATION: --Vertebral arteries: Normal right V4 segment. There is loss of opacification of the left V4 segment distal to the PICA origin. --Posterior inferior cerebellar arteries (PICA): Patent origins from the vertebral arteries. --Anterior inferior cerebellar arteries (AICA): Right dominant --Basilar artery: Diminutive --Superior cerebellar arteries: Normal. --Posterior cerebral arteries: Normal. Both are  predominantly supplied by the posterior communicating arteries (p-comm). ANTERIOR CIRCULATION: --Intracranial internal carotid arteries: Atherosclerotic calcification of the internal carotid arteries at the skull base without hemodynamically significant stenosis. --Anterior cerebral arteries (ACA): There is a new focus of calcification in the distal right A2 segment (series 12, image 22) that may indicate partially calcified thrombus. However, the remainder of the vessel is normally opacified. Calcification in the left A2 segment is unchanged. Both  A1 segments are present. Patent anterior communicating artery (a-comm). --Middle cerebral arteries (MCA): Normal. VENOUS SINUSES: As permitted by contrast timing, patent. ANATOMIC VARIANTS: Bilateral fetal origins of the posterior cerebral arteries. Review of the MIP images confirms the above findings. IMPRESSION: 1. No emergent large vessel occlusion or hemodynamically significant stenosis. 2. New focus of calcification in the right anterior cerebral artery distal A2 segment (s12:i22), which may indicate partially calcified thrombus. The vessel is patent distal to this, however. 3. Decreased opacification of the distal V4 segment of the left vertebral artery, distal to the PICA origin, age-indeterminate. Bilateral fetal origins of the posterior cerebral arteries with diminutive vertebrobasilar system. 4. Status post recent coronary artery bypass graft with retrosternal fluid collection that measures 4.3 x 2.0 x 5.2 cm. In the immediate postoperative setting, this is favored to be a seroma. 5. Bilateral pleural effusions with large loculated component of the left effusion. 6. Aortic Atherosclerosis (ICD10-I70.0). Electronically Signed   By: Ulyses Jarred M.D.   On: 03/14/2019 19:36   Dg Chest Port 1 View  Result Date: 03/15/2019 CLINICAL DATA:  Status post coronary artery bypass graft. EXAM: PORTABLE CHEST 1 VIEW COMPARISON:  Radiograph of March 14, 2019. FINDINGS: Stable cardiomegaly. Status post coronary bypass graft. Right-sided PICC line is unchanged in position. Stable bibasilar atelectasis is noted with associated pleural effusions. Stable left midlung opacity is noted most consistent with small hematoma related to prior chest tube. Bony thorax is unremarkable. IMPRESSION: Stable bibasilar atelectasis is noted with associated pleural effusions. Stable rounded density seen in left midlung most consistent with hematoma secondary to prior chest tube. Electronically Signed   By: Marijo Conception M.D.   On:  03/15/2019 08:33   Dg Chest Port 1v Same Day  Result Date: 03/14/2019 CLINICAL DATA:  Bedside PICC placement. EXAM: PORTABLE CHEST 1 VIEW 1:04 p.m.: COMPARISON:  Portable chest x-ray earlier same day at 5:29 a.m. and previously. FINDINGS: RIGHT arm PICC tip projects at or near the cavoatrial junction. RIGHT jugular introducer sheath tip projects at the junction of the jugular and innominate veins. Prior sternotomy for CABG. Cardiac silhouette markedly enlarged. Normal pulmonary vascularity. Bibasilar atelectasis, unchanged since earlier in the day. Round opacity in the LEFT UPPER LOBE at the site of the prior chest tube, unchanged, presumably a small hematoma. No new pulmonary parenchymal abnormalities. IMPRESSION: 1. RIGHT arm PICC tip projects at or near the cavoatrial junction. 2. Remaining support apparatus satisfactory. 3. Stable bibasilar atelectasis. 4. Stable rounded opacity in the LEFT upper lobe at the site of the prior chest tube, presumably a small hematoma. 5. No new abnormalities. Electronically Signed   By: Evangeline Dakin M.D.   On: 03/14/2019 13:18     Assessment/Plan: S/P Procedure(s) (LRB): CORONARY ARTERY BYPASS GRAFTING (CABG)X 3 Using Left Internal Mammary Artery and Right Saphenous Vein Grafts (N/A) TRANSESOPHAGEAL ECHOCARDIOGRAM (TEE) (N/A)   1. CV-PAF. Co ox this am slightly decreased to 57.2, Milrinone stopped. On and Amiodarone drip and Lopressor 25 mg bid. He is hypertensive so will discuss with Dr. Prescott Gum. Renal  function just recovered so hesitant to start ACE. 2. Pulmonary-on 2 liters of oxygen via [place]. CXR this am shows bibasilar atelectasis and bilateral pleural effusions, L>R. Monitor need for left thoracentesis. Likely atelectasis left mid lung. Has OSA and uses CPAP at night. Encourage incentive spirometer. 3. Diastolic heart failure-on Lasix 80 mg IV bid 4. ABL anemia-H and H this am stable at 10.6 and 30.6 5. Neuro-repeat CT showed small right ACA infarct. On  Heparin drip 6. Hyponatremia-sodium this am down to 126. Likely related to diuresis 7. Creatinine this am decreased from 1.33 to 0.97 8. GI-on full liquids. Speech pathology recommendations being followed. 9. CBGs 140/149/166. On Insulin. Pre op HGA1C 5.6. 10. Patient still with EPW;will discuss with Dr. Prescott Gum when ok to remove 11. Await to see if candidate for CIR  Donielle Liston Alba PA-C 03/15/2019 10:36 AM   Dont remove EPWs until off heparin Resume preop po cardizem for BP and afib,  mobilize OOB  Cont iv amio untril converts to nsr  patient examined and medical record reviewed,agree with above note. Tharon Aquas Trigt III 03/15/2019

## 2019-03-16 ENCOUNTER — Inpatient Hospital Stay (HOSPITAL_COMMUNITY): Payer: Medicare Other

## 2019-03-16 DIAGNOSIS — I63421 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: Secondary | ICD-10-CM

## 2019-03-16 DIAGNOSIS — R5383 Other fatigue: Secondary | ICD-10-CM

## 2019-03-16 LAB — POCT I-STAT 7, (LYTES, BLD GAS, ICA,H+H)
Acid-Base Excess: 1 mmol/L (ref 0.0–2.0)
Bicarbonate: 24.2 mmol/L (ref 20.0–28.0)
Calcium, Ion: 1.1 mmol/L — ABNORMAL LOW (ref 1.15–1.40)
HCT: 36 % — ABNORMAL LOW (ref 39.0–52.0)
Hemoglobin: 12.2 g/dL — ABNORMAL LOW (ref 13.0–17.0)
O2 Saturation: 93 %
Patient temperature: 99.1
Potassium: 3.9 mmol/L (ref 3.5–5.1)
Sodium: 125 mmol/L — ABNORMAL LOW (ref 135–145)
TCO2: 25 mmol/L (ref 22–32)
pCO2 arterial: 32.7 mmHg (ref 32.0–48.0)
pH, Arterial: 7.478 — ABNORMAL HIGH (ref 7.350–7.450)
pO2, Arterial: 61 mmHg — ABNORMAL LOW (ref 83.0–108.0)

## 2019-03-16 LAB — COMPREHENSIVE METABOLIC PANEL
ALT: 29 U/L (ref 0–44)
AST: 29 U/L (ref 15–41)
Albumin: 2.1 g/dL — ABNORMAL LOW (ref 3.5–5.0)
Alkaline Phosphatase: 160 U/L — ABNORMAL HIGH (ref 38–126)
Anion gap: 13 (ref 5–15)
BUN: 25 mg/dL — ABNORMAL HIGH (ref 8–23)
CO2: 24 mmol/L (ref 22–32)
Calcium: 8.1 mg/dL — ABNORMAL LOW (ref 8.9–10.3)
Chloride: 90 mmol/L — ABNORMAL LOW (ref 98–111)
Creatinine, Ser: 0.82 mg/dL (ref 0.61–1.24)
GFR calc Af Amer: >60 mL/min (ref >60)
GFR calc non Af Amer: >60 mL/min (ref >60)
Glucose, Bld: 154 mg/dL — ABNORMAL HIGH (ref 70–99)
Potassium: 3.2 mmol/L — ABNORMAL LOW (ref 3.5–5.1)
Sodium: 127 mmol/L — ABNORMAL LOW (ref 135–145)
Total Bilirubin: 1.2 mg/dL (ref 0.3–1.2)
Total Protein: 5.4 g/dL — ABNORMAL LOW (ref 6.5–8.1)

## 2019-03-16 LAB — COOXEMETRY PANEL
Carboxyhemoglobin: 1 % (ref 0.5–1.5)
Carboxyhemoglobin: 1 % (ref 0.5–1.5)
Methemoglobin: 0.8 % (ref 0.0–1.5)
Methemoglobin: 0.6 % (ref 0.0–1.5)
O2 Saturation: 44.3 %
O2 Saturation: 57.7 %
Total hemoglobin: 13.1 g/dL (ref 12.0–16.0)
Total hemoglobin: 16.3 g/dL — ABNORMAL HIGH (ref 12.0–16.0)

## 2019-03-16 LAB — CBC
HCT: 32.5 % — ABNORMAL LOW (ref 39.0–52.0)
Hemoglobin: 11.2 g/dL — ABNORMAL LOW (ref 13.0–17.0)
MCH: 33.1 pg (ref 26.0–34.0)
MCHC: 34.5 g/dL (ref 30.0–36.0)
MCV: 96.2 fL (ref 80.0–100.0)
Platelets: 211 10*3/uL (ref 150–400)
RBC: 3.38 MIL/uL — ABNORMAL LOW (ref 4.22–5.81)
RDW: 13.2 % (ref 11.5–15.5)
WBC: 6.7 10*3/uL (ref 4.0–10.5)
nRBC: 0 % (ref 0.0–0.2)

## 2019-03-16 LAB — GLUCOSE, CAPILLARY
Glucose-Capillary: 109 mg/dL — ABNORMAL HIGH (ref 70–99)
Glucose-Capillary: 186 mg/dL — ABNORMAL HIGH (ref 70–99)
Glucose-Capillary: 110 mg/dL — ABNORMAL HIGH (ref 70–99)
Glucose-Capillary: 136 mg/dL — ABNORMAL HIGH (ref 70–99)

## 2019-03-16 LAB — HEPARIN LEVEL (UNFRACTIONATED): Heparin Unfractionated: 0.26 IU/mL — ABNORMAL LOW (ref 0.30–0.70)

## 2019-03-16 LAB — MAGNESIUM: Magnesium: 1.9 mg/dL (ref 1.7–2.4)

## 2019-03-16 MED ORDER — APIXABAN 5 MG PO TABS
5.0000 mg | ORAL_TABLET | Freq: Two times a day (BID) | ORAL | Status: DC
  Administered 2019-03-16 – 2019-03-20 (×8): 5 mg via ORAL
  Filled 2019-03-16 (×8): qty 1

## 2019-03-16 MED ORDER — DILTIAZEM HCL ER 60 MG PO CP12
120.0000 mg | ORAL_CAPSULE | Freq: Two times a day (BID) | ORAL | Status: DC
  Administered 2019-03-16 – 2019-03-20 (×9): 120 mg via ORAL
  Filled 2019-03-16 (×11): qty 2

## 2019-03-16 MED ORDER — AMIODARONE LOAD VIA INFUSION
150.0000 mg | Freq: Once | INTRAVENOUS | Status: AC
  Administered 2019-03-16: 09:00:00 150 mg via INTRAVENOUS
  Filled 2019-03-16: qty 83.34

## 2019-03-16 MED ORDER — CHLORHEXIDINE GLUCONATE CLOTH 2 % EX PADS
6.0000 | MEDICATED_PAD | Freq: Every day | CUTANEOUS | Status: DC
  Administered 2019-03-16 – 2019-03-27 (×12): 6 via TOPICAL

## 2019-03-16 MED ORDER — MAGNESIUM CITRATE PO SOLN
1.0000 | ORAL | Status: DC | PRN
  Filled 2019-03-16: qty 296

## 2019-03-16 MED ORDER — BISACODYL 5 MG PO TBEC
10.0000 mg | DELAYED_RELEASE_TABLET | Freq: Every day | ORAL | Status: DC
  Administered 2019-03-16 – 2019-03-27 (×9): 10 mg via ORAL
  Filled 2019-03-16 (×11): qty 2

## 2019-03-16 MED ORDER — POTASSIUM CHLORIDE 10 MEQ/50ML IV SOLN
10.0000 meq | INTRAVENOUS | Status: AC
  Administered 2019-03-16: 09:00:00 10 meq via INTRAVENOUS
  Filled 2019-03-16: qty 50

## 2019-03-16 MED ORDER — METOCLOPRAMIDE HCL 5 MG/ML IJ SOLN
10.0000 mg | Freq: Four times a day (QID) | INTRAMUSCULAR | Status: AC
  Administered 2019-03-16 (×3): 10 mg via INTRAVENOUS
  Filled 2019-03-16 (×3): qty 2

## 2019-03-16 MED ORDER — POTASSIUM CHLORIDE CRYS ER 20 MEQ PO TBCR
20.0000 meq | EXTENDED_RELEASE_TABLET | ORAL | Status: AC
  Administered 2019-03-16 (×3): 20 meq via ORAL
  Filled 2019-03-16 (×3): qty 1

## 2019-03-16 MED ORDER — BISACODYL 10 MG RE SUPP
10.0000 mg | Freq: Every day | RECTAL | Status: DC
  Filled 2019-03-16: qty 1

## 2019-03-16 NOTE — Progress Notes (Signed)
  Speech Language Pathology Treatment: Dysphagia;Cognitive-Linquistic  Patient Details Name: Martin [name] MRN: [id number] DOB: 06/18/1955 Today's Date: 03/16/2019 Time: [id number] SLP Time Calculation (min) (ACUTE ONLY): 13 min  Assessment / Plan / Recommendation Clinical Impression  SWALLOWING Pt tolerated all consistencies trialed with no clinical s/s of aspiration.  Pt exhibited good oral clearance of regular solids.  Pt and RN report no difficulties with full liquid meal trays. Pt to remain on full liquid diet until he is able to have bowel movement, but once this happens, and pt is medically appropriate he is safe to advance to a regular texture diet.  Pt did not have any complaints of globus sensation today with trials, or complaints of post nasal drip.  COGNITION Pt oriented to x4.  He endorsed some confusion.  Says he feels like he's been around the world and thought that he was in [place] yesterday.  Pt demonstrated call bell use and independently generated 3 reasons to use call bell for nurse assistance. Pt followed directions and assisted SLP with repositioning in bed. Pt is eager to go home but understands he needs to work on recovery and speak with medical team.     HPI HPI: Pt is a 63 y/o male with PMH of  CAD, paroxysmal atrial fibrillation, OA, low back pain, polysubstance abuse, presents with chest pain and possible NSTEMI. S/p L heart cath and coronary angiography 9/30, CABG x 3 03/10/19.  Course complicated by CT revealing R distal ACA infarct 03/13/19. New CXR 10/8 afer full liquid diet initiated: "Evidence of bilateral pleural effusions with retrocardiac and left perihilar atelectasis."      SLP Plan  Continue with current plan of care;Goals updated       Recommendations  Diet recommendations: Regular;Thin liquid Liquids provided via: Cup;Straw Medication Administration: Whole meds with liquid Supervision: Patient able to self feed Compensations: Slow rate;Small  sips/bites;Minimize environmental distractions Postural Changes and/or Swallow Maneuvers: Seated upright 90 degrees                Oral Care Recommendations: Oral care BID Follow up Recommendations: Inpatient Rehab SLP Visit Diagnosis: Cognitive communication deficit (R41.841);Dysphagia, oropharyngeal phase (R13.12) Plan: Continue with current plan of care;Goals updated       [place], [place], [place] Office: [phone]; Pager (10/8): [phone] 03/16/2019, 9:43 AM

## 2019-03-16 NOTE — Progress Notes (Signed)
EEG complete - results pending 

## 2019-03-16 NOTE — Progress Notes (Signed)
While eating lunch at 1200 pt suddenly became lethargic and had significant change in LOC, unable to answer any orientation questions and was mumbling words. Had difficulty opening left eye. Would arouse to sternal rub and attempt to grip hands and follow commands but was not able to stay awake. Pt so lethargic RN unable to complete NIH. A blood gas was obtained and neurology was notified of change in mental status. RN instructed to stop heparin gtt and take the patient for a stat head CT. VSS during this time.     MD assessed pt in CT. LOC improved. Pt A&O x 3. Back to baseline. VSS. Will continue to monitor.

## 2019-03-16 NOTE — Progress Notes (Addendum)
[facility] DAILY ICU PROGRESS NOTE                   [address]            [place] [id number]          [phone]   6 Days Post-Op Procedure(s) (LRB): CORONARY ARTERY BYPASS GRAFTING (CABG)X 3 Using Left Internal Mammary Artery and Right Saphenous Vein Grafts (N/A) TRANSESOPHAGEAL ECHOCARDIOGRAM (TEE) (N/A)  Total Length of Stay:  LOS: 9 days   Subjective: Patient has nausea, abdominal discomfort, and constipation this am.   Objective: Vital signs in last 24 hours: Temp:  [97.6 F (36.4 C)-99.2 F (37.3 C)] 99.1 F (37.3 C) (10/08 0400) Pulse Rate:  [66-117] 97 (10/08 0813) Cardiac Rhythm: Atrial fibrillation (10/08 0000) Resp:  [14-23] 23 (10/08 0813) BP: (93-193)/(66-150) 158/91 (10/08 0813) SpO2:  [84 %-95 %] 90 % (10/08 0813) FiO2 (%):  [32 %] 32 % (10/08 0813) Weight:  [127.7 kg] 127.7 kg (10/08 0500)  Filed Weights   03/14/19 0500 03/15/19 0444 03/16/19 0500  Weight: 130.6 kg 131.2 kg 127.7 kg   Weight change: -3.5 kg       Intake/Output from previous day: 10/07 0701 - 10/08 0700 In: 1451 [P.O.:640; I.V.:811] Out: 4570 [Urine:4570]  Intake/Output this shift: Total I/O In: 192.2 [I.V.:176; IV Piggyback:16.2] Out: 600 [Urine:600]  Current Meds: Scheduled Meds: . allopurinol  300 mg Oral q morning - 10a  . aspirin EC  81 mg Oral Daily  . atorvastatin  80 mg Oral q1800  . bisacodyl  10 mg Rectal Daily   Or  . bisacodyl  10 mg Oral Daily  . Chlorhexidine Gluconate Cloth  6 each Topical Daily  . diltiazem  120 mg Oral Q12H  . docusate sodium  200 mg Oral Daily  . fluticasone  1 spray Each Nare Daily  . folic acid  1 mg Oral Daily  . furosemide  80 mg Intravenous BID  . guaiFENesin  600 mg Oral BID  . insulin aspart  0-24 Units Subcutaneous TID AC & HS  . insulin detemir  10 Units Subcutaneous Daily  . metoprolol tartrate  25 mg Oral BID  . mometasone-formoterol  2 puff Inhalation BID  . pantoprazole  40 mg Oral Daily  . potassium  chloride  20 mEq Oral Q4H  . sodium chloride flush  10-40 mL Intracatheter Q12H  . sodium chloride flush  3 mL Intravenous Q12H  . thiamine  100 mg Intravenous Daily  . vitamin B-12  100 mcg Oral Daily   Continuous Infusions: . sodium chloride Stopped (03/11/19 1110)  . sodium chloride    . sodium chloride 10 mL/hr at 03/10/19 1428  . amiodarone 30 mg/hr (03/16/19 0900)  . heparin 1,500 Units/hr (03/16/19 0900)  . potassium chloride 50 mL/hr at 03/16/19 0900   PRN Meds:.sodium chloride, ALPRAZolam, hydrALAZINE, ipratropium-albuterol, magnesium citrate, midazolam, morphine injection, ondansetron (ZOFRAN) IV, oxyCODONE, sodium chloride flush, sodium chloride flush, traMADol  General appearance: alert, cooperative and no distress Neurologic: left sided weakness. He is able to move left arm and pick it up a little but not able to move left leg (LLE plegia) Heart:  IRRR IRRR Lungs: Diminished bibasilar breath sounds L>R Abdomen: Semi soft, obese, diffusely mildly  tender to palpation, rare bowel sounds Extremities: SCDs in place Wounds: Minor sero sanguinous drainage lower sternal wound  Lab Results: CBC: Recent Labs    03/15/19 0437 03/16/19 0431  WBC 10.4 6.7  HGB  10.6* 11.2*  HCT 30.6* 32.5*  PLT 167 211   BMET:  Recent Labs    03/15/19 0437 03/16/19 0431  NA 126* 127*  K 3.8 3.2*  CL 92* 90*  CO2 23 24  GLUCOSE 142* 154*  BUN 31* 25*  CREATININE 0.97 0.82  CALCIUM 8.5* 8.1*    CMET: Lab Results  Component Value Date   WBC 6.7 03/16/2019   HGB 11.2 (L) 03/16/2019   HCT 32.5 (L) 03/16/2019   PLT 211 03/16/2019   GLUCOSE 154 (H) 03/16/2019   CHOL 170 03/07/2019   TRIG 218 (H) 03/07/2019   HDL 41 03/07/2019   LDLCALC 85 03/07/2019   ALT 29 03/16/2019   AST 29 03/16/2019   NA 127 (L) 03/16/2019   K 3.2 (L) 03/16/2019   CL 90 (L) 03/16/2019   CREATININE 0.82 03/16/2019   BUN 25 (H) 03/16/2019   CO2 24 03/16/2019   TSH 10.690 (H) 03/12/2019   INR 1.4 (H)  03/10/2019   HGBA1C 5.6 03/09/2019    PT/INR: No results for input(s): LABPROT, INR in the last 72 hours. Radiology: Dg Chest Port 1 View  Result Date: 03/16/2019 CLINICAL DATA:  63 year old male with shortness of breath, pleural effusion. Status post CABG postoperative day 6. EXAM: PORTABLE CHEST 1 VIEW COMPARISON:  Portable chest 03/15/2019 and earlier. FINDINGS: Portable AP semi upright view at 0527 hours. Right IJ introducer sheath is removed. Right PICC line remains. Stable cardiomegaly and lung volumes. No pneumothorax or pulmonary edema. Veiling lung base opacity and dense retrocardiac opacity. Superimposed nodular probable segmental atelectasis near the left hilum. No acute osseous abnormality identified. IMPRESSION: 1. Right IJ introducer sheath removed.  Stable right PICC line. 2. Evidence of bilateral pleural effusions with retrocardiac and left perihilar atelectasis. Electronically Signed   By: Genevie Ann M.D.   On: 03/16/2019 08:17     Assessment/Plan: S/P Procedure(s) (LRB): CORONARY ARTERY BYPASS GRAFTING (CABG)X 3 Using Left Internal Mammary Artery and Right Saphenous Vein Grafts (N/A) TRANSESOPHAGEAL ECHOCARDIOGRAM (TEE) (N/A)   1. CV-PAF. Co ox this am slightly decreased to 44.3. On and Amiodarone drip and Lopressor 25 mg bid and Cardizem SR just increased to 120 mg bid 2. Pulmonary-on 2-3 liters of oxygen via [place]. Has OSA and uses CPAP at night. Encourage incentive spirometer. 3. Diastolic heart failure-on Lasix 80 mg IV bid 4. ABL anemia-H and H this am stable at 11.2 and 32.5 5. Neuro-repeat CT showed small right ACA thrombus. On Heparin drip 6. Hyponatremia-sodium this am 127. Likely related to diuresis 8. GI-on full liquids. Speech pathology recommendations being followed. He has nausea and constipation this am. Dulcolax already ordered and will give a few doses of Reglan as well. 9. CBGs 140/149/166. On Insulin. Pre op HGA1C 5.6. 10. Patient still with EPW and will remove  once off Heparin, per Dr. Prescott Gum 11. Supplement potassium 12. Await to see if candidate for CIR  Martin Liston Alba PA-C 03/16/2019 9:04 AM   Abdomen benign on exam- dulcolax ordered With persistent afib will transition from heparin to Eliquis, pull EPWs later next week before he goes to CIR. Needs medical recovery from CABG before going to CIR  patient examined and medical record reviewed,agree with above note. Martin [name] 03/16/2019

## 2019-03-16 NOTE — Progress Notes (Addendum)
STROKE TEAM PROGRESS NOTE   SUBJECTIVE (INTERVAL HISTORY) Pt seen in CT scanner. As per RN, pt was eating lunch at 12pm, had sudden onset lethargy, drowsy sleepy and decreased LOC, not able to answer orientation question, mumbling on words. BP at that time 135/70s, no hypotension. No shaking jerking. Heparin was stopped and stat CT no bleeding and stable right ACA infarct. While exam in CT, pt much improved and orientated x 3. Apparently back to his baseline. Will resume heparin transition to eliquis, and will do EEG.    OBJECTIVE Temp:  [97.6 F (36.4 C)-99.1 F (37.3 C)] 99.1 F (37.3 C) (10/08 0400) Pulse Rate:  [66-111] 81 (10/08 1330) Cardiac Rhythm: Atrial fibrillation (10/08 1130) Resp:  [14-25] 21 (10/08 1330) BP: (93-193)/(66-150) 135/91 (10/08 1330) SpO2:  [88 %-96 %] 96 % (10/08 1330) FiO2 (%):  [32 %] 32 % (10/08 0813) Weight:  [127.7 kg] 127.7 kg (10/08 0500)  Recent Labs  Lab 03/15/19 1535 03/15/19 2004 03/15/19 2208 03/16/19 0631 03/16/19 1228  GLUCAP 176* 151* 131* 109* 186*   Recent Labs  Lab 03/10/19 1857 03/11/19 0409  03/11/19 1712  03/12/19 0653 03/13/19 0435 03/14/19 0449 03/15/19 0437 03/16/19 0431 03/16/19 1239  NA 137 134*   < > 134*   < > 132* 129* 129* 126* 127* 125*  K 3.8 3.9   < > 4.2   < > 4.0 3.8 3.8 3.8 3.2* 3.9  CL 107 105  --  101   < > 99 94* 92* 92* 90*  --   CO2 22 21*  --  23   < > 24 25 25 23 24   --   GLUCOSE 132* 134*  --  174*   < > 181* 134* 158* 142* 154*  --   BUN 14 14  --  20   < > 27* 36* 35* 31* 25*  --   CREATININE 0.75 0.88  --  1.42*   < > 2.00* 2.21* 1.33* 0.97 0.82  --   CALCIUM 7.7* 7.7*  --  8.3*   < > 8.2* 8.3* 8.7* 8.5* 8.1*  --   MG 2.7* 2.2  --  2.1  --  2.1  --   --   --  1.9  --    < > = values in this interval not displayed.   Recent Labs  Lab 03/12/19 0803 03/13/19 0435 03/14/19 0449 03/15/19 0437 03/16/19 0431  AST 35 26 26 31 29   ALT 26 22 23 29 29   ALKPHOS 50 53 73 126 160*  BILITOT 1.7* 0.8  1.0 1.4* 1.2  PROT 6.0* 5.4* 5.4* 5.7* 5.4*  ALBUMIN 3.1* 2.7* 2.4* 2.3* 2.1*   Recent Labs  Lab 03/12/19 0653 03/13/19 0435 03/14/19 0449 03/15/19 0437 03/16/19 0431 03/16/19 1239  WBC 15.3* 12.8* 10.8* 10.4 6.7  --   HGB 12.1* 11.0* 11.0* 10.6* 11.2* 12.2*  HCT 35.9* 32.3* 31.9* 30.6* 32.5* 36.0*  MCV 101.1* 98.8 97.9 98.7 96.2  --   PLT 122* 127* 163 167 211  --    No results for input(s): CKTOTAL, CKMB, CKMBINDEX, TROPONINI in the last 168 hours. No results for input(s): LABPROT, INR in the last 72 hours. No results for input(s): COLORURINE, LABSPEC, [place], GLUCOSEU, HGBUR, BILIRUBINUR, KETONESUR, PROTEINUR, UROBILINOGEN, NITRITE, LEUKOCYTESUR in the last 72 hours.  Invalid input(s): APPERANCEUR     Component Value Date/Time   CHOL 170 03/07/2019 2358   TRIG 218 (H) 03/07/2019 2358   HDL 41 03/07/2019 2358  CHOLHDL 4.1 03/07/2019 2358   VLDL 44 (H) 03/07/2019 2358   LDLCALC 85 03/07/2019 2358   Lab Results  Component Value Date   HGBA1C 5.6 03/09/2019      Component Value Date/Time   LABOPIA NONE DETECTED 05/20/2010 1326   COCAINSCRNUR NONE DETECTED 05/20/2010 1326   LABBENZ POSITIVE (A) 05/20/2010 1326   AMPHETMU NONE DETECTED 05/20/2010 1326   THCU NONE DETECTED 05/20/2010 1326   LABBARB  05/20/2010 1326    NONE DETECTED        DRUG SCREEN FOR MEDICAL PURPOSES ONLY.  IF CONFIRMATION IS NEEDED FOR ANY PURPOSE, NOTIFY LAB WITHIN 5 DAYS.        LOWEST DETECTABLE LIMITS FOR URINE DRUG SCREEN Drug Class       Cutoff (ng/mL) Amphetamine      1000 Barbiturate      200 Benzodiazepine   [id number] Tricyclics       [id number] Opiates          300 Cocaine          300 THC              50    No results for input(s): ETH in the last 168 hours.  I have personally reviewed the radiological images below and agree with the radiology interpretations.  Ct Angio Head W Or Wo Contrast  Result Date: 03/14/2019 CLINICAL DATA:  Anterior cerebral artery territory infarct  follow-up EXAM: CT ANGIOGRAPHY HEAD AND NECK TECHNIQUE: Multidetector CT imaging of the head and neck was performed using the standard protocol during bolus administration of intravenous contrast. Multiplanar CT image reconstructions and MIPs were obtained to evaluate the vascular anatomy. Carotid stenosis measurements (when applicable) are obtained utilizing NASCET criteria, using the distal internal carotid diameter as the denominator. CONTRAST:  23mL OMNIPAQUE IOHEXOL 350 MG/ML SOLN COMPARISON:  CTA head neck 10/23/2017 Head CT 03/13/2019 FINDINGS: CTA NECK FINDINGS SKELETON: There is no bony spinal canal stenosis. No lytic or blastic lesion. OTHER NECK: Status post recent coronary artery bypass graft with median sternotomy. There is a retrosternal fluid collection that measures 4.3 x 2.0 x 5.2 cm. UPPER CHEST: Bilateral pleural effusions. There is a large loculated component of the left effusion. AORTIC ARCH: There is moderate calcific atherosclerosis of the aortic arch. There is no aneurysm, dissection or hemodynamically significant stenosis of the visualized portion of the aorta. Normal variant aortic arch branching pattern with the brachiocephalic and left common carotid arteries sharing a common origin. The visualized proximal subclavian arteries are widely patent. RIGHT CAROTID SYSTEM: There is mild narrowing of the proximal common carotid artery due to noncalcified plaque. There is circumferential atherosclerotic calcification at the carotid bifurcation and proximal internal carotid artery resulting in less than 50% stenosis. The distal internal carotid artery is widely patent. Proximal external carotid artery is normal. LEFT CAROTID SYSTEM: Mild common carotid atherosclerosis. Circumferential calcification of the proximal ICA without hemodynamically significant stenosis. Normal distal ICA aside from minor calcification. There is mild narrowing of the origin of the external carotid artery due to  noncalcified plaque. VERTEBRAL ARTERIES: Right dominant configuration. Poor visualization of the right origin. Left origin is patent. There is no dissection, occlusion or flow-limiting stenosis to the skull base (V1-V3 segments). CTA HEAD FINDINGS POSTERIOR CIRCULATION: --Vertebral arteries: Normal right V4 segment. There is loss of opacification of the left V4 segment distal to the PICA origin. --Posterior inferior cerebellar arteries (PICA): Patent origins from the vertebral arteries. --Anterior inferior cerebellar arteries (AICA): Right dominant --Basilar  artery: Diminutive --Superior cerebellar arteries: Normal. --Posterior cerebral arteries: Normal. Both are predominantly supplied by the posterior communicating arteries (p-comm). ANTERIOR CIRCULATION: --Intracranial internal carotid arteries: Atherosclerotic calcification of the internal carotid arteries at the skull base without hemodynamically significant stenosis. --Anterior cerebral arteries (ACA): There is a new focus of calcification in the distal right A2 segment (series 12, image 22) that may indicate partially calcified thrombus. However, the remainder of the vessel is normally opacified. Calcification in the left A2 segment is unchanged. Both A1 segments are present. Patent anterior communicating artery (a-comm). --Middle cerebral arteries (MCA): Normal. VENOUS SINUSES: As permitted by contrast timing, patent. ANATOMIC VARIANTS: Bilateral fetal origins of the posterior cerebral arteries. Review of the MIP images confirms the above findings. IMPRESSION: 1. No emergent large vessel occlusion or hemodynamically significant stenosis. 2. New focus of calcification in the right anterior cerebral artery distal A2 segment (s12:i22), which may indicate partially calcified thrombus. The vessel is patent distal to this, however. 3. Decreased opacification of the distal V4 segment of the left vertebral artery, distal to the PICA origin, age-indeterminate.  Bilateral fetal origins of the posterior cerebral arteries with diminutive vertebrobasilar system. 4. Status post recent coronary artery bypass graft with retrosternal fluid collection that measures 4.3 x 2.0 x 5.2 cm. In the immediate postoperative setting, this is favored to be a seroma. 5. Bilateral pleural effusions with large loculated component of the left effusion. 6. Aortic Atherosclerosis (ICD10-I70.0). Electronically Signed   By: Ulyses Jarred M.D.   On: 03/14/2019 19:36   Dg Chest 1 View  Result Date: 03/10/2019 CLINICAL DATA:  Status post CABG, rule out pneumo. EXAM: CHEST  1 VIEW COMPARISON:  03/07/2019 FINDINGS: Now status post median sternotomy and CABG. Swan-Ganz catheter in proximal right pulmonary artery. A transesophageal echocardiographic pro passes to the level of the T10 vertebral body. Seven intact sternal wires are noted. Left-sided chest tube in place without signs of pneumothorax on supine radiograph. Cardiomediastinal contours with mild widening superiorly likely secondary to postoperative change otherwise unchanged accounting for low lung volumes. There is a single tube in the midline projecting over sternal wires extending to the level of the sternal manubrium. This may represent mediastinal drain. IMPRESSION: 1. Status post CABG with left chest tube without visible pneumothorax. 2. Swan-Ganz catheter and transesophageal echocardiographic probe in place. 3. Midline drainage tubing projects over the mediastinum, potentially mediastinal drain, clinical correlation suggested. Electronically Signed   By: Zetta Bills M.D.   On: 03/10/2019 13:30   Dg Chest 2 View  Result Date: 03/07/2019 CLINICAL DATA:  Chest pain for 2 weeks. EXAM: CHEST - 2 VIEW COMPARISON:  10/28/2017 and prior radiographs FINDINGS: Cardiomegaly and LEFT basilar scarring again noted. There is no evidence of focal airspace disease, pulmonary edema, suspicious pulmonary nodule/mass, pleural effusion, or  pneumothorax. No acute bony abnormalities are identified. IMPRESSION: Cardiomegaly without evidence of acute cardiopulmonary disease. Electronically Signed   By: Margarette Canada M.D.   On: 03/07/2019 14:30   Ct Head Wo Contrast  Result Date: 03/16/2019 CLINICAL DATA:  62 year old male with altered mental status. Right ACA territory infarct. Episode of unresponsiveness. EXAM: CT HEAD WITHOUT CONTRAST TECHNIQUE: Contiguous axial images were obtained from the base of the skull through the vertex without intravenous contrast. COMPARISON:  CT head and CTA head and neck 03/14/2019. FINDINGS: Brain: Cytotoxic edema in the distal right ACA territory on series 3, image 64 has not significantly changed. No associated hemorrhage or mass effect. There is additional patchy hypodensity in the  right MCA/ACA watershed white matter on series 3, image 25 which is stable. Lesser left hemisphere white matter hypodensity. No midline shift, mass effect, or evidence of intracranial mass lesion. No ventriculomegaly. No new cortically based infarct. Vascular: Calcified atherosclerosis at the skull base. No suspicious intracranial vascular hyperdensity. a Skull: No acute osseous abnormality identified. Sinuses/Orbits: Mildly increased paranasal sinus mucosal thickening and opacity. Tympanic cavities and mastoids remain clear. Other: No acute orbit or scalp soft tissue findings. IMPRESSION: 1. Stable CT appearance of the distal right ACA and right ACA/MCA watershed ischemia. No associated hemorrhage or mass effect. Underlying chronic white matter disease. 2. No new intracranial abnormality. Electronically Signed   By: Genevie Ann M.D.   On: 03/16/2019 13:38   Ct Head Wo Contrast  Result Date: 03/14/2019 CLINICAL DATA:  Stroke, follow-up. EXAM: CT HEAD WITHOUT CONTRAST TECHNIQUE: Contiguous axial images were obtained from the base of the skull through the vertex without intravenous contrast. COMPARISON:  CT head without contrast 03/13/2019  FINDINGS: Brain: Progressive hypoattenuation is noted within the right cingulate gyrus. Subjacent white matter hypoattenuation is progressed as well. No acute hemorrhage is noted. Precentral gyrus is intact. Periventricular white matter changes are otherwise stable. No significant extraaxial fluid collection is present. The brainstem and cerebellum are within normal limits. Vascular: Atherosclerotic changes are noted within the cavernous internal carotid arteries. There is no hyperdense vessel. Skull: Calvarium is intact. No focal lytic or blastic lesions are present. Sinuses/Orbits: Mild mucosal thickening is present within ethmoid air cells. No fluid levels are present. The globes and orbits are within normal limits. IMPRESSION: 1. Progressive right ACA territory nonhemorrhagic infarct. 2. Progressive adjacent white matter infarct. 3. Atherosclerosis. 4. Diffuse small vessel disease. Electronically Signed   By: San Morelle M.D.   On: 03/14/2019 12:12   Ct Head Wo Contrast  Result Date: 03/13/2019 CLINICAL DATA:  Generalized muscle weakness. Left-sided weakness EXAM: CT HEAD WITHOUT CONTRAST TECHNIQUE: Contiguous axial images were obtained from the base of the skull through the vertex without intravenous contrast. COMPARISON:  Brain MRI 04/18/2018 FINDINGS: Brain: Small area of cytotoxic appearance in the parasagittal and posterior right frontal region, distal ACA territory. No hemorrhage, hydrocephalus, or masslike finding. Low-density in the cerebral white matter that is fairly symmetric and likely chronic microvascular ischemia but progressed from MR. Small cortically based calcification along the right hemisphere, nonspecific in isolation. Vascular: Atherosclerotic calcification. Skull: Negative. Sinuses/Orbits: Negative. Other: These results were communicated to Dr. Lorraine Lax at 6:52 amon 10/5/2020by text page via the [facility] messaging system. IMPRESSION: 1. Small acute cortical infarct in the distal  right ACA territory. 2. Cerebral white matter low-density that is mild and symmetric but may have progressed from 2019, question right watershed white matter infarcts as well. Electronically Signed   By: Monte Fantasia M.D.   On: 03/13/2019 06:58   Ct Angio Neck W Or Wo Contrast  Result Date: 03/14/2019 CLINICAL DATA:  Anterior cerebral artery territory infarct follow-up EXAM: CT ANGIOGRAPHY HEAD AND NECK TECHNIQUE: Multidetector CT imaging of the head and neck was performed using the standard protocol during bolus administration of intravenous contrast. Multiplanar CT image reconstructions and MIPs were obtained to evaluate the vascular anatomy. Carotid stenosis measurements (when applicable) are obtained utilizing NASCET criteria, using the distal internal carotid diameter as the denominator. CONTRAST:  26mL OMNIPAQUE IOHEXOL 350 MG/ML SOLN COMPARISON:  CTA head neck 10/23/2017 Head CT 03/13/2019 FINDINGS: CTA NECK FINDINGS SKELETON: There is no bony spinal canal stenosis. No lytic or blastic lesion. OTHER  NECK: Status post recent coronary artery bypass graft with median sternotomy. There is a retrosternal fluid collection that measures 4.3 x 2.0 x 5.2 cm. UPPER CHEST: Bilateral pleural effusions. There is a large loculated component of the left effusion. AORTIC ARCH: There is moderate calcific atherosclerosis of the aortic arch. There is no aneurysm, dissection or hemodynamically significant stenosis of the visualized portion of the aorta. Normal variant aortic arch branching pattern with the brachiocephalic and left common carotid arteries sharing a common origin. The visualized proximal subclavian arteries are widely patent. RIGHT CAROTID SYSTEM: There is mild narrowing of the proximal common carotid artery due to noncalcified plaque. There is circumferential atherosclerotic calcification at the carotid bifurcation and proximal internal carotid artery resulting in less than 50% stenosis. The distal  internal carotid artery is widely patent. Proximal external carotid artery is normal. LEFT CAROTID SYSTEM: Mild common carotid atherosclerosis. Circumferential calcification of the proximal ICA without hemodynamically significant stenosis. Normal distal ICA aside from minor calcification. There is mild narrowing of the origin of the external carotid artery due to noncalcified plaque. VERTEBRAL ARTERIES: Right dominant configuration. Poor visualization of the right origin. Left origin is patent. There is no dissection, occlusion or flow-limiting stenosis to the skull base (V1-V3 segments). CTA HEAD FINDINGS POSTERIOR CIRCULATION: --Vertebral arteries: Normal right V4 segment. There is loss of opacification of the left V4 segment distal to the PICA origin. --Posterior inferior cerebellar arteries (PICA): Patent origins from the vertebral arteries. --Anterior inferior cerebellar arteries (AICA): Right dominant --Basilar artery: Diminutive --Superior cerebellar arteries: Normal. --Posterior cerebral arteries: Normal. Both are predominantly supplied by the posterior communicating arteries (p-comm). ANTERIOR CIRCULATION: --Intracranial internal carotid arteries: Atherosclerotic calcification of the internal carotid arteries at the skull base without hemodynamically significant stenosis. --Anterior cerebral arteries (ACA): There is a new focus of calcification in the distal right A2 segment (series 12, image 22) that may indicate partially calcified thrombus. However, the remainder of the vessel is normally opacified. Calcification in the left A2 segment is unchanged. Both A1 segments are present. Patent anterior communicating artery (a-comm). --Middle cerebral arteries (MCA): Normal. VENOUS SINUSES: As permitted by contrast timing, patent. ANATOMIC VARIANTS: Bilateral fetal origins of the posterior cerebral arteries. Review of the MIP images confirms the above findings. IMPRESSION: 1. No emergent large vessel occlusion or  hemodynamically significant stenosis. 2. New focus of calcification in the right anterior cerebral artery distal A2 segment (s12:i22), which may indicate partially calcified thrombus. The vessel is patent distal to this, however. 3. Decreased opacification of the distal V4 segment of the left vertebral artery, distal to the PICA origin, age-indeterminate. Bilateral fetal origins of the posterior cerebral arteries with diminutive vertebrobasilar system. 4. Status post recent coronary artery bypass graft with retrosternal fluid collection that measures 4.3 x 2.0 x 5.2 cm. In the immediate postoperative setting, this is favored to be a seroma. 5. Bilateral pleural effusions with large loculated component of the left effusion. 6. Aortic Atherosclerosis (ICD10-I70.0). Electronically Signed   By: Ulyses Jarred M.D.   On: 03/14/2019 19:36   Dg Chest Port 1 View  Result Date: 03/16/2019 CLINICAL DATA:  63 year old male with shortness of breath, pleural effusion. Status post CABG postoperative day 6. EXAM: PORTABLE CHEST 1 VIEW COMPARISON:  Portable chest 03/15/2019 and earlier. FINDINGS: Portable AP semi upright view at 0527 hours. Right IJ introducer sheath is removed. Right PICC line remains. Stable cardiomegaly and lung volumes. No pneumothorax or pulmonary edema. Veiling lung base opacity and dense retrocardiac opacity. Superimposed nodular probable  segmental atelectasis near the left hilum. No acute osseous abnormality identified. IMPRESSION: 1. Right IJ introducer sheath removed.  Stable right PICC line. 2. Evidence of bilateral pleural effusions with retrocardiac and left perihilar atelectasis. Electronically Signed   By: Genevie Ann M.D.   On: 03/16/2019 08:17   Dg Chest Port 1 View  Result Date: 03/15/2019 CLINICAL DATA:  Status post coronary artery bypass graft. EXAM: PORTABLE CHEST 1 VIEW COMPARISON:  Radiograph of March 14, 2019. FINDINGS: Stable cardiomegaly. Status post coronary bypass graft. Right-sided  PICC line is unchanged in position. Stable bibasilar atelectasis is noted with associated pleural effusions. Stable left midlung opacity is noted most consistent with small hematoma related to prior chest tube. Bony thorax is unremarkable. IMPRESSION: Stable bibasilar atelectasis is noted with associated pleural effusions. Stable rounded density seen in left midlung most consistent with hematoma secondary to prior chest tube. Electronically Signed   By: Marijo Conception M.D.   On: 03/15/2019 08:33   Dg Chest Port 1 View  Result Date: 03/14/2019 CLINICAL DATA:  Short of breath.  Status post CABG surgery. EXAM: PORTABLE CHEST 1 VIEW COMPARISON:  03/13/2019 and earlier exams. FINDINGS: Left chest tube has been removed. There is opacity corresponding to the chest tube tract consistent with fluid/atelectasis. No pneumothorax. Persistent opacity noted at the bases consistent with a combination of pleural effusions and atelectasis. No convincing pulmonary edema. No mediastinal widening.  Cardiac silhouette is enlarged but stable. Stable right internal jugular Cordis. IMPRESSION: 1. Status post removal of the left chest tube.  No pneumothorax. 2. Persistent lung base opacity consistent with a combination of pleural effusions and atelectasis. Electronically Signed   By: Lajean Manes M.D.   On: 03/14/2019 08:23   Dg Chest Port 1 View  Result Date: 03/13/2019 CLINICAL DATA:  Chest tube placement EXAM: PORTABLE CHEST 1 VIEW COMPARISON:  03/12/2019 FINDINGS: The left-sided chest tube is stable in positioning. The right-sided central venous catheter is stable in positioning. The heart size remains significantly enlarged. The patient is status post prior median sternotomy. There are bilateral pleural effusions with adjacent atelectasis. There is no pneumothorax. There is generalized volume overload. IMPRESSION: 1. Lines and tubes as above. 2. No pneumothorax. 3. Cardiomegaly with persistent bilateral pleural effusions and  adjacent atelectasis. Electronically Signed   By: Constance Holster M.D.   On: 03/13/2019 08:02   Dg Chest Port 1 View  Result Date: 03/12/2019 CLINICAL DATA:  Status post surgery. EXAM: PORTABLE CHEST 1 VIEW COMPARISON:  03/11/2019 FINDINGS: Interval removal of the Swan-Ganz catheter. Right jugular central venous sheath remains in place. Left-sided chest tube in unchanged position. No left pneumothorax. Mild left basilar atelectasis. Right basilar airspace disease likely reflecting atelectasis with a small right pleural effusion. No right pneumothorax. Stable cardiomediastinal silhouette. Recent CABG. IMPRESSION: 1. Left-sided chest tube in unchanged position. No left pneumothorax. Mild left basilar atelectasis. 2. Right basilar atelectasis with a small right pleural effusion. Electronically Signed   By: Kathreen Devoid   On: 03/12/2019 09:11   Dg Chest Port 1 View  Result Date: 03/11/2019 CLINICAL DATA:  CABG x3 03/10/2019. EXAM: PORTABLE CHEST 1 VIEW COMPARISON:  03/10/2019 FINDINGS: Right IJ Swan-Ganz catheter unchanged with tip over the right main pulmonary artery. Left-sided chest tube unchanged. Mediastinal drain unchanged. Lungs are adequately inflated with very minimal bibasilar density likely small amount of bilateral pleural fluid/atelectasis. Mild stable cardiomegaly. Remainder of the exam is unchanged. IMPRESSION: Suggestion of small amount of bilateral pleural fluid/atelectasis. Stable cardiomegaly. Tubes  and lines as described. Electronically Signed   By: Marin Olp M.D.   On: 03/11/2019 08:46   Dg Chest Port 1 View  Result Date: 03/10/2019 CLINICAL DATA:  S/p CABG x 3, chest tube in place, pt intubated. EXAM: PORTABLE CHEST 1 VIEW COMPARISON:  Chest radiograph 03/10/2019 at 1:09 p.m. FINDINGS: Stable enlarged cardiomediastinal contours status post median sternotomy. Interval intubation with endotracheal tube tip projecting between the thoracic inlet and carina. Stable remaining support  apparatus including a PA catheter, enteric tube and left chest tube. The lungs are well aerated. No new focal pulmonary opacity. No pneumothorax or large pleural effusion. IMPRESSION: 1. Interval intubation with endotracheal tube tip projecting between the thoracic inlet and carina. 2. Postoperative appearance of the chest. Electronically Signed   By: Audie Pinto M.D.   On: 03/10/2019 15:08   Dg Chest Port 1v Same Day  Result Date: 03/14/2019 CLINICAL DATA:  Bedside PICC placement. EXAM: PORTABLE CHEST 1 VIEW 1:04 p.m.: COMPARISON:  Portable chest x-ray earlier same day at 5:29 a.m. and previously. FINDINGS: RIGHT arm PICC tip projects at or near the cavoatrial junction. RIGHT jugular introducer sheath tip projects at the junction of the jugular and innominate veins. Prior sternotomy for CABG. Cardiac silhouette markedly enlarged. Normal pulmonary vascularity. Bibasilar atelectasis, unchanged since earlier in the day. Round opacity in the LEFT UPPER LOBE at the site of the prior chest tube, unchanged, presumably a small hematoma. No new pulmonary parenchymal abnormalities. IMPRESSION: 1. RIGHT arm PICC tip projects at or near the cavoatrial junction. 2. Remaining support apparatus satisfactory. 3. Stable bibasilar atelectasis. 4. Stable rounded opacity in the LEFT upper lobe at the site of the prior chest tube, presumably a small hematoma. 5. No new abnormalities. Electronically Signed   By: Evangeline Dakin M.D.   On: 03/14/2019 13:18   Vas US Doppler Pre Cabg  Result Date: 03/09/2019 PREOPERATIVE VASCULAR EVALUATION  Indications:      Pre CABG evaluation. Risk Factors:     Hypertension, hyperlipidemia, coronary artery disease. Other Factors:    Atrial fibrillation. Comparison Study: Prior study done 05/23/16 is available for comparison Performing Technologist: Birdena Crandall, [place] Technologist: Sharion Dove RVS  Examination Guidelines: A complete evaluation includes B-mode imaging,  spectral Doppler, color Doppler, and power Doppler as needed of all accessible portions of each vessel. Bilateral testing is considered an integral part of a complete examination. Limited examinations for reoccurring indications may be performed as noted.  Right Carotid Findings: +----------+--------+--------+--------+--------+------------------+           PSV cm/sEDV cm/sStenosisDescribeComments           +----------+--------+--------+--------+--------+------------------+ CCA Prox  46      6               calcific                   +----------+--------+--------+--------+--------+------------------+ CCA Distal42      7                       intimal thickening +----------+--------+--------+--------+--------+------------------+ ICA Prox  46      14              calcificShadowing          +----------+--------+--------+--------+--------+------------------+ ICA Distal52      11                                         +----------+--------+--------+--------+--------+------------------+  ECA       108     11                                         +----------+--------+--------+--------+--------+------------------+ Portions of this table do not appear on this page. +----------+--------+-------+--------+------------+           PSV cm/sEDV cmsDescribeArm Pressure +----------+--------+-------+--------+------------+ Subclavian161                    145          +----------+--------+-------+--------+------------+ +---------+--------+--+--------+--+ VertebralPSV cm/s34EDV cm/s11 +---------+--------+--+--------+--+ Left Carotid Findings: +----------+--------+--------+--------+------------+------------------+           PSV cm/sEDV cm/sStenosisDescribe    Comments           +----------+--------+--------+--------+------------+------------------+ CCA Prox  55      13              homogeneous                     +----------+--------+--------+--------+------------+------------------+ CCA Distal57      12                          intimal thickening +----------+--------+--------+--------+------------+------------------+ ICA Prox  66      22              heterogenous                   +----------+--------+--------+--------+------------+------------------+ ICA Distal77      21                          tortuous           +----------+--------+--------+--------+------------+------------------+ ECA       184     19                                             +----------+--------+--------+--------+------------+------------------+ +----------+--------+--------+--------+------------+ SubclavianPSV cm/sEDV cm/sDescribeArm Pressure +----------+--------+--------+--------+------------+           220                     145          +----------+--------+--------+--------+------------+ +---------+--------+--+--------+-+ VertebralPSV cm/s33EDV cm/s7 +---------+--------+--+--------+-+  ABI Findings: +---------+------------------+-----+----------+--------+ Right    Rt Pressure (mmHg)IndexWaveform  Comment  +---------+------------------+-----+----------+--------+ Brachial 145                    triphasic          +---------+------------------+-----+----------+--------+ PTA      104               0.72 monophasic         +---------+------------------+-----+----------+--------+ DP       117               0.81 monophasic         +---------+------------------+-----+----------+--------+ Great Toe76                0.52                    +---------+------------------+-----+----------+--------+ +---------+------------------+-----+---------+-------+ Left     Lt Pressure (mmHg)IndexWaveform Comment +---------+------------------+-----+---------+-------+ Brachial 145  triphasic        +---------+------------------+-----+---------+-------+ PTA      186                1.28 biphasic         +---------+------------------+-----+---------+-------+ DP       175               1.21 biphasic         +---------+------------------+-----+---------+-------+ Great Toe118               0.81                  +---------+------------------+-----+---------+-------+ +-------+---------------+----------------+ ABI/TBIToday's ABI/TBIPrevious ABI/TBI +-------+---------------+----------------+ Right  0.81/0.52      No previous      +-------+---------------+----------------+ Left   1.28/0.81      No previous      +-------+---------------+----------------+  Right Doppler Findings: +-----------+--------+-----+---------+--------+ Site       PressureIndexDoppler  Comments +-----------+--------+-----+---------+--------+ Brachial   145          triphasic         +-----------+--------+-----+---------+--------+ Radial                  triphasic         +-----------+--------+-----+---------+--------+ Ulnar                   triphasic         +-----------+--------+-----+---------+--------+ Palmar Arch                      normal   +-----------+--------+-----+---------+--------+  Left Doppler Findings: +-----------+--------+-----+---------+--------+ Site       PressureIndexDoppler  Comments +-----------+--------+-----+---------+--------+ Brachial   145          triphasic         +-----------+--------+-----+---------+--------+ Radial                  triphasic         +-----------+--------+-----+---------+--------+ Ulnar                   triphasic         +-----------+--------+-----+---------+--------+ Palmar Arch                      normal   +-----------+--------+-----+---------+--------+  Summary: Right Carotid: Velocities in the right ICA are consistent with a 1-39% stenosis.                No change since study done 05/23/16. Left Carotid: Velocities in the left ICA are consistent with a 1-39% stenosis.                No change since study done 05/23/16. Vertebrals:  Bilateral vertebral arteries demonstrate antegrade flow. Subclavians: Normal flow hemodynamics were seen in bilateral subclavian              arteries. Right ABI: Resting right ankle-brachial index indicates mild right lower extremity arterial disease. The right toe-brachial index is abnormal. Left ABI: Resting left ankle-brachial index is within normal range. No evidence of significant left lower extremity arterial disease. The left toe-brachial index is normal. Right Upper Extremity: Doppler waveforms remain within normal limits with right radial compression. Doppler waveforms remain within normal limits with right ulnar compression. Left Upper Extremity: Doppler waveforms remain within normal limits with left radial compression. Doppler waveforms remain within normal limits with left ulnar compression.  Electronically signed by Servando Snare MD on 03/09/2019 at 5:45:33 PM.  Final    Vas [place] Transcranial Doppler  Result Date: 03/14/2019  Transcranial Doppler Indications: ICH. History: Post op CABG 03/10/19. Hx TIA. New onset left side weakness 03/12/19. Limitations for diagnostic windows: Unable to insonate occipital window. Comparison Study: no prior Performing Technologist: June Leap RDMS, RVT  Examination Guidelines: A complete evaluation includes B-mode imaging, spectral Doppler, color Doppler, and power Doppler as needed of all accessible portions of each vessel. Bilateral testing is considered an integral part of a complete examination. Limited examinations for reoccurring indications may be performed as noted.  +----------+-------------+----------+-----------+--------------+ RIGHT TCD Right VM (cm)Depth (cm)Pulsatility   Comment     +----------+-------------+----------+-----------+--------------+ MCA           63.00                 1.18                   +----------+-------------+----------+-----------+--------------+ ACA           -28.00                 0.97                   +----------+-------------+----------+-----------+--------------+ Term ICA      56.00                   1                    +----------+-------------+----------+-----------+--------------+ PCA           18.00                 1.11                   +----------+-------------+----------+-----------+--------------+ Opthalmic     22.00                 1.52                   +----------+-------------+----------+-----------+--------------+ ICA siphon    38.00                 1.17                   +----------+-------------+----------+-----------+--------------+ Vertebral                                   not visualized +----------+-------------+----------+-----------+--------------+  +----------+------------+----------+-----------+--------------+ LEFT TCD  Left VM (cm)Depth (cm)Pulsatility   Comment     +----------+------------+----------+-----------+--------------+ MCA          67.00                 0.84                   +----------+------------+----------+-----------+--------------+ ACA          -24.00                1.06                   +----------+------------+----------+-----------+--------------+ Term ICA     60.00                 1.08                   +----------+------------+----------+-----------+--------------+ PCA          44.00  1.02                   +----------+------------+----------+-----------+--------------+ Opthalmic    19.00                 1.66                   +----------+------------+----------+-----------+--------------+ ICA siphon   38.00                 0.95                   +----------+------------+----------+-----------+--------------+ Vertebral                                  not visualized +----------+------------+----------+-----------+--------------+  +------------+-------+------------------------+             VM cm/s        Comment           +------------+-------+------------------------+ Prox Basilar-14.00 Poorly visualized window +------------+-------+------------------------+ Summary:  Normalmean flow velocities in identified vessels of anterior cerebral cirn. Suboptimal occipital window limits evaluation of posterior circulation vessels. *See table(s) above for measurements and observations.  Diagnosing physician: Antony Contras MD Electronically signed by Antony Contras MD on 03/14/2019 at 8:07:57 AM.    Final    [place] Ekg Site Rite  Result Date: 03/14/2019 If Site Rite image not attached, placement could not be confirmed due to current cardiac rhythm.   PHYSICAL EXAM   Temp:  [97.6 F (36.4 C)-99.1 F (37.3 C)] 99.1 F (37.3 C) (10/08 0400) Pulse Rate:  [66-111] 81 (10/08 1330) Resp:  [14-25] 21 (10/08 1330) BP: (93-193)/(66-150) 135/91 (10/08 1330) SpO2:  [88 %-96 %] 96 % (10/08 1330) FiO2 (%):  [32 %] 32 % (10/08 0813) Weight:  [127.7 kg] 127.7 kg (10/08 0500)  General - Well nourished, well developed, in no apparent distress.  Ophthalmologic - fundi not visualized due to noncooperation.  Cardiovascular - regular heart rate and rhythm today, not in afib  Mental Status -  Level of arousal and orientation to time, place, and person were intact. Language including expression, naming, repetition, comprehension was assessed and found intact. Fund of Knowledge was assessed and was intact.  Cranial Nerves II - XII - II - Visual field intact OU. III, IV, VI - Extraocular movements intact. V - Facial sensation intact bilaterally. VII - Facial movement intact bilaterally. VIII - Hearing & vestibular intact bilaterally. X - Palate elevates symmetrically. XI - Chin turning & shoulder shrug intact bilaterally. XII - Tongue protrusion intact.  Motor Strength - The patient's strength was normal in RUE and RLE, however, left UE proximal 3/5 and distal finger grip 4/5. LLE plegic. Bulk was normal and fasciculations were  absent.   Motor Tone - Muscle tone was assessed at the neck and appendages and was normal.  Reflexes - The patient's reflexes were symmetrical in all extremities except diminished LLE and he had no pathological reflexes.  Sensory - Light touch, temperature/pinprick were assessed and were symmetrical. Sensory neglect resolved on LLE today.    Coordination - The patient had normal movements in right hand with no ataxia or dysmetria.  Tremor was absent.  Gait and Station - deferred.   ASSESSMENT/PLAN [name] is a 63 y.o. male with history of A. fib on Eliquis, CAD, hypertension, hyperlipidemia, OSA on CPAP admitted for unstable angina, status post CABG 10/2.  Found to have left  lower extremity weakness yesterday afternoon but worsening this morning with left lower extremity plegic.  No tPA given due to outside window and recent surgery.    Acute episode of lethargy  Occurred 12pm 10/8  Sudden onset lethargy, drowsy, decreased LOC but resolved after CT head  Stat CT no bleeding  BP reported normal, no hypotension  Will do EEG to rule out seizure  Avoid low BP  Continue heparin to eliquis transition  Stroke:  right posterior ACA distal infarct, etiology uncertain, could be A. fib while holding on AC versus intracranial atherosclerosis in the setting of low BP  Resultant left arm weakness and left leg plegic  MRI not able to perform due to recent CABG procedure  CT head showed right distal ACA infarct  CTA head and neck No ELVO. New R ACA A1 partially calcified clot. L V4 opacifiecation. B fetal origin PCAs w/ small VB system.   Repeat CT 10/6 stable small right ACA infarct  Carotid Doppler 10/1 bilateral ICA 1 to 39% stenosis  TEE 10/2 EF 55 to 60%, no PFO  TCD showed no proximal intracranial large vessel stenosis  LDL 85  HgbA1c 5.6  Lovenox for VTE prophylaxis  Eliquis (apixaban) daily prior to admission, now on aspirin 81 mg daily. On heparin IV 10/6  per stroke protocol, so far tolerating well, OK with transition back to Eliquis  Therapy recommendations: CIR  Disposition: Pending  Hx of hypertension Hypotension  BP low at 70s-90s on 10/4 from midnight to morning, now much improved  Pt on cardizem drip, milrinone drip, metoprolol po which all have impact on low BP  Yesterday BP 110-120s  Avoid low BP  BP goal 110-140 to maintain cerebral perfusion  Now BP 130-160s  afib RVR  Cardiology on board  On amiodarone, cardizem and metoprolol  eliquis on hold for now  On heparin IV 10/6 per stroke protocol, so far tolerating well, OK with transition back to Eliquis  Unstable angina / CAD s/p CABG  CABG 10/2  CTS and cardiology on board  On ASA 81 and lipitor 80  AKI   Cre 0.88->2.21->1.33 ->0.97   On IVF  Manage per primary team  Hyperlipidemia  Home meds:  lipitor   LDL 85, goal < 70  Now on lipitor 80  Continue statin at discharge  Other Stroke Risk Factors  Advanced age  Morbid Obesity, Body mass index is 42.81 kg/m.   Hx stroke/TIA  Obstructive sleep apnea, on CPAP at home  Other Active Problems  Hyponatremia - Na 129->129->126    Leukocytosis WBC 12.8->10.8->10.4    Thrombocytopenia, resolved - Platelet 127->163->167   Hospital day # 9  I spent  35 minutes in total face-to-face time with the patient, more than 50% of which was spent in counseling and coordination of care, reviewing test results, images and medication, and discussing the diagnosis of acute episode of decreased LOC, stroke, afib, CAD s/p CABG, AKI, treatment plan and potential prognosis. This patient's care requiresreview of multiple databases, neurological assessment, discussion with family, other specialists and medical decision making of high complexity.   Rosalin Hawking, MD PhD Stroke Neurology 03/16/2019 2:06 PM    To contact Stroke Continuity provider, please refer to [email or web address] After hours, contact General  Neurology

## 2019-03-16 NOTE — Progress Notes (Signed)
Physical Therapy Treatment Patient Details Name: Martin [name] MRN: [id number] DOB: Sep 28, 1955 Today's Date: 03/16/2019    History of Present Illness Pt is a 63 y/o male with PMH of  CAD, paroxysmal atrial fibrillation, OA, low back pain, polysubstance abuse, presents with chest pain and possible NSTEMI. S/p L heart cath and coronary angiography 9/30, CABG x 3 03/10/19.  Course complicated by CT revealing R distal ACA infarct 03/13/19.    PT Comments    Pt seen with plan to transfer to [facility] and recliner using maxisky lift. Pt initially awake/alert eating lunch, but more lethargic when treatment session started, becoming less interactive requiring max cues to attend to PT/RN. TotalA for mobility; no LUE/LLE active movement noted. OOB transfer deferred due to increased lethargy; pt positioned in bed to encourage midline posture. Wife present at end of session, discussed CIR vs. SNF-level therapies; wife states she will be able to provide 24/7 support upon pt's return home if needed.   Follow Up Recommendations  CIR;Supervision/Assistance - 24 hour     Equipment Recommendations  (TBD next venue)    Recommendations for Other Services       Precautions / Restrictions Precautions Precautions: Fall;Sternal;Other (comment) Precaution Comments: L hemiparesis    Mobility  Bed Mobility               General bed mobility comments: TotalA to roll R/L for pad placement; pt not engaging this session, lethargic  Transfers                 General transfer comment: Planned to transfer to [facility] and recliner with maxisky lift -- deferred due to new onset lethargy  Ambulation/Gait                 Stairs             Wheelchair Mobility    Modified Rankin (Stroke Patients Only)       Balance                                            Cognition Arousal/Alertness: Lethargic Behavior During Therapy: Flat affect Overall Cognitive Status:  Impaired/Different from baseline                                 General Comments: Pt noted to be alert when eating lunch, eyes close upon PT entering room initially answering questions and opening eyes, but pt seemingly becoming more lethargic keeping eyes closed, requiring max stimulus to awaken and respond; RN to order ABGs and stat CT      Exercises Other Exercises Other Exercises: No active LUE/LLE movement noted    General Comments General comments (skin integrity, edema, etc.): Pt with lift pad under him in bed in preparation to transfer to recliner with maxisky lift, transfer deferred due to new onset lethargy - RN/NT to remove when RT done check blood gas values. Wife present at end of session, able to discuss CIR vs. SNF-level therapies; wife will be able to provide 24/7 assist if needed      Pertinent Vitals/Pain Pain Assessment: Faces Faces Pain Scale: Hurts a little bit Pain Location: Generalized Pain Descriptors / Indicators: Grimacing;Discomfort Pain Intervention(s): Monitored during session    Home Living  Prior Function            PT Goals (current goals can now be found in the care plan section) Progress towards PT goals: Not progressing toward goals - comment(increased lethargy)    Frequency    Min 4X/week      PT Plan Current plan remains appropriate    Co-evaluation              AM-PAC PT "6 Clicks" Mobility   Outcome Measure  Help needed turning from your back to your side while in a flat bed without using bedrails?: Total Help needed moving from lying on your back to sitting on the side of a flat bed without using bedrails?: Total Help needed moving to and from a bed to a chair (including a wheelchair)?: Total Help needed standing up from a chair using your arms (e.g., wheelchair or bedside chair)?: Total Help needed to walk in hospital room?: Total Help needed climbing 3-5 steps with a railing?  : Total 6 Click Score: 6    End of Session   Activity Tolerance: Patient limited by lethargy Patient left: in bed;with call bell/phone within reach;with nursing/sitter in room;with family/visitor present Nurse Communication: Mobility status;Need for lift equipment PT Visit Diagnosis: Muscle weakness (generalized) (M62.81);Hemiplegia and hemiparesis;Other abnormalities of gait and mobility (R26.89) Hemiplegia - Right/Left: Left Hemiplegia - dominant/non-dominant: Non-dominant Hemiplegia - caused by: Cerebral infarction     Time: 1209-1229 PT Time Calculation (min) (ACUTE ONLY): 20 min  Charges:  $Therapeutic Activity: 8-22 mins                    Mabeline Caras, PT, DPT Acute Rehabilitation Services  Pager [phone] Office [place] 03/16/2019, 2:00 PM

## 2019-03-16 NOTE — Procedures (Signed)
Patient Name: ILIYAN MOULD  MRN: [id number]  Epilepsy Attending: Lora Havens  Referring Physician/Provider: Dr Rosalin Hawking Date: 03/16/2019 Duration: 23.28 mins  Patient history: 64yo M with right  ACA and MCA stroke with an episode of lethargy. EEG to evaluate for seizures.  Level of alertness: awake     AEDs during EEG study: Xanax  Technical aspects: This EEG study was done with scalp electrodes positioned according to the 10-20 International system of electrode placement. Electrical activity was acquired at a sampling rate of 500Hz  and reviewed with a high frequency filter of 70Hz  and a low frequency filter of 1Hz . EEG data were recorded continuously and digitally stored.   DESCRIPTION: The posterior dominant rhythm consists of 8-9 Hz activity of moderate voltage (25-35 uV) seen predominantly in posterior head regions, symmetric and reactive to eye opening and eye closing.   There is an excessive amount of 15 to 18 Hz, 2-3 uV beta activity with irregular morphology distributed symmetrically and diffusely.    Hyperventilation and photic stimulation were not performed.  ABNORMALITY - Excessive fast, generalized  IMPRESSION: This study is within normal limits. No seizures or epileptiform discharges were seen throughout the recording.  The excessive beta activity seen in the background is most likely due to the effect of benzodiazepine and is a benign EEG pattern.  Elic Vencill Barbra Sarks

## 2019-03-16 NOTE — Progress Notes (Signed)
      [address]       [place] [address]             [phone]      POD # 6 CABG  Alert currently BP (!) 142/106   Pulse 85   Temp 99.9 F (37.7 C) (Oral)   Resp (!) 25   Ht 5\' 8"  (1.727 m)   Wt 127.7 kg   SpO2 93%   BMI 42.81 kg/m   Intake/Output Summary (Last 24 hours) at 03/16/2019 1820 Last data filed at 03/16/2019 1700 Gross per 24 hour  Intake 1042.94 ml  Output 3270 ml  Net -2227.06 ml   EEG- no seizure activity  Remo Lipps C. Roxan Hockey, MD [facility] [phone]

## 2019-03-16 NOTE — Progress Notes (Addendum)
[place] for heparin Indication: atrial fibrillation  Allergies  Allergen Reactions  . Penicillins Hives and Rash    Has patient had a PCN reaction causing immediate rash, facial/tongue/throat swelling, SOB or lightheadedness with hypotension: Unknown Has patient had a PCN reaction causing severe rash involving mucus membranes or skin necrosis: Unknown Has patient had a PCN reaction that required hospitalization: No Has patient had a PCN reaction occurring within the last 10 years: No If all of the above answers are "NO", then may proceed with Cephalosporin use.      Patient Measurements: Height: 5\' 8"  (172.7 cm) Weight: 281 lb 8.4 oz (127.7 kg) IBW/kg (Calculated) : 68.4 Heparin Dosing Weight: 96kg  Vital Signs: Temp: 99.1 F (37.3 C) (10/08 0400) Temp Source: Oral (10/08 0400) BP: 103/72 (10/08 0600) Pulse Rate: 71 (10/08 0600)  Labs: Recent Labs    03/14/19 0449  03/15/19 0437  03/15/19 0800 03/15/19 1132 03/16/19 0431  HGB 11.0*  --  10.6*  --   --   --  11.2*  HCT 31.9*  --  30.6*  --   --   --  32.5*  PLT 163  --  167  --   --   --  211  HEPARINUNFRC  --    < >  --    < > 0.24* 0.31 0.26*  CREATININE 1.33*  --  0.97  --   --   --  0.82   < > = values in this interval not displayed.    Estimated Creatinine Clearance: 117 mL/min (by C-G formula based on SCr of 0.82 mg/dL).   Assessment: 63 yo afib and CVA and noted with NSTEMI s/p cath with 3VCAD.  Pharmacy consulted to dose heparin (plans noted to resume apixaban when stable). -heparin level= 0.26    Goal of Therapy:  Heparin level= 0.2-0.3 (due to neck hematoma) Monitor platelets by anticoagulation protocol: Yes   Plan:  -No heparin changes needed -Heparin level and CBC daily  Hildred Laser, PharmD Clinical Pharmacist **Pharmacist phone directory can now be found on [email or web address] ([email or web address]).  Listed under [place].

## 2019-03-16 NOTE — Progress Notes (Signed)
Progress Note  Patient Name: Martin [name] Date of Encounter: 03/16/2019  Primary Cardiologist: Kate Sable, MD   Subjective  O/N events: Nausea w/ abdominal pain. Still no bowel movement.  Mr.Errickson was examined and evaluated at bedside this AM. He was observed resting comfortably in bed. He states he is currently endorsing nausea and mild abdominal tenderness. Mentions feeling bloated. Still no bowel movement since surgery.  Inpatient Medications    Scheduled Meds:  allopurinol  300 mg Oral q morning - 10a   aspirin EC  81 mg Oral Daily   atorvastatin  80 mg Oral q1800   bisacodyl  10 mg Oral Daily   Or   bisacodyl  10 mg Rectal Daily   Chlorhexidine Gluconate Cloth  6 each Topical Daily   diltiazem  90 mg Oral Q12H   docusate sodium  200 mg Oral Daily   fluticasone  1 spray Each Nare Daily   folic acid  1 mg Oral Daily   furosemide  80 mg Intravenous BID   guaiFENesin  600 mg Oral BID   insulin aspart  0-24 Units Subcutaneous TID AC & HS   insulin detemir  10 Units Subcutaneous Daily   metoprolol tartrate  25 mg Oral BID   mometasone-formoterol  2 puff Inhalation BID   pantoprazole  40 mg Oral Daily   potassium chloride  20 mEq Oral Q4H   sodium chloride flush  10-40 mL Intracatheter Q12H   sodium chloride flush  3 mL Intravenous Q12H   thiamine  100 mg Intravenous Daily   vitamin B-12  100 mcg Oral Daily   Continuous Infusions:  sodium chloride Stopped (03/11/19 1110)   sodium chloride     sodium chloride 10 mL/hr at 03/10/19 1428   amiodarone 30 mg/hr (03/16/19 0600)   heparin 1,500 Units/hr (03/16/19 0600)   PRN Meds: sodium chloride, ALPRAZolam, hydrALAZINE, ipratropium-albuterol, midazolam, morphine injection, ondansetron (ZOFRAN) IV, oxyCODONE, sodium chloride flush, sodium chloride flush, traMADol   Vital Signs    Vitals:   03/16/19 0300 03/16/19 0400 03/16/19 0500 03/16/19 0600  BP: 134/84 127/72 98/66 103/72    Pulse: 72 77 75 71  Resp: 19 17 16 14   Temp:  99.1 F (37.3 C)    TempSrc:  Oral    SpO2: 93% 91% 92% 93%  Weight:   127.7 kg   Height:        Intake/Output Summary (Last 24 hours) at 03/16/2019 0731 Last data filed at 03/16/2019 0600 Gross per 24 hour  Intake 1451.04 ml  Output 4570 ml  Net -3118.96 ml   Filed Weights   03/14/19 0500 03/15/19 0444 03/16/19 0500  Weight: 130.6 kg 131.2 kg 127.7 kg    Telemetry    Irregularly irregular but no RVR - Personally Reviewed  ECG    None from this am - Personally Reviewed  Physical Exam   Gen: Well-developed, well nourished, NAD HEENT: NCAT head, hearing intact, EOMI Neck: supple, ROM intact, no JVD CV: RRR, S1, S2 normal, No rubs, no murmurs, no gallops Pulm: CTAB, mild bibasilar rales Extm: Peripheral pulses intact, trace pitting edema Skin: Dry, Warm, normal turgor  Neuro: AAOx3, LLE strength 1/5, RLE strength 3/5, RUE strength 5/5, LUE strength 4/5  Labs    Chemistry Recent Labs  Lab 03/14/19 0449 03/15/19 0437 03/16/19 0431  NA 129* 126* 127*  K 3.8 3.8 3.2*  CL 92* 92* 90*  CO2 25 23 24   GLUCOSE 158* 142* 154*  BUN 35*  31* 25*  CREATININE 1.33* 0.97 0.82  CALCIUM 8.7* 8.5* 8.1*  PROT 5.4* 5.7* 5.4*  ALBUMIN 2.4* 2.3* 2.1*  AST 26 31 29   ALT 23 29 29   ALKPHOS 73 126 160*  BILITOT 1.0 1.4* 1.2  GFRNONAA 56* >60 >60  GFRAA >60 >60 >60  ANIONGAP 12 11 13      Hematology Recent Labs  Lab 03/14/19 0449 03/15/19 0437 03/16/19 0431  WBC 10.8* 10.4 6.7  RBC 3.26* 3.10* 3.38*  HGB 11.0* 10.6* 11.2*  HCT 31.9* 30.6* 32.5*  MCV 97.9 98.7 96.2  MCH 33.7 34.2* 33.1  MCHC 34.5 34.6 34.5  RDW 13.2 13.3 13.2  PLT 163 167 211    Cardiac EnzymesNo results for input(s): TROPONINI in the last 168 hours. No results for input(s): TROPIPOC in the last 168 hours.   BNP No results for input(s): BNP, PROBNP in the last 168 hours.   DDimer No results for input(s): DDIMER in the last 168 hours.   Radiology     Ct Angio Head W Or Wo Contrast  Result Date: 03/14/2019 CLINICAL DATA:  Anterior cerebral artery territory infarct follow-up EXAM: CT ANGIOGRAPHY HEAD AND NECK TECHNIQUE: Multidetector CT imaging of the head and neck was performed using the standard protocol during bolus administration of intravenous contrast. Multiplanar CT image reconstructions and MIPs were obtained to evaluate the vascular anatomy. Carotid stenosis measurements (when applicable) are obtained utilizing NASCET criteria, using the distal internal carotid diameter as the denominator. CONTRAST:  3mL OMNIPAQUE IOHEXOL 350 MG/ML SOLN COMPARISON:  CTA head neck 10/23/2017 Head CT 03/13/2019 FINDINGS: CTA NECK FINDINGS SKELETON: There is no bony spinal canal stenosis. No lytic or blastic lesion. OTHER NECK: Status post recent coronary artery bypass graft with median sternotomy. There is a retrosternal fluid collection that measures 4.3 x 2.0 x 5.2 cm. UPPER CHEST: Bilateral pleural effusions. There is a large loculated component of the left effusion. AORTIC ARCH: There is moderate calcific atherosclerosis of the aortic arch. There is no aneurysm, dissection or hemodynamically significant stenosis of the visualized portion of the aorta. Normal variant aortic arch branching pattern with the brachiocephalic and left common carotid arteries sharing a common origin. The visualized proximal subclavian arteries are widely patent. RIGHT CAROTID SYSTEM: There is mild narrowing of the proximal common carotid artery due to noncalcified plaque. There is circumferential atherosclerotic calcification at the carotid bifurcation and proximal internal carotid artery resulting in less than 50% stenosis. The distal internal carotid artery is widely patent. Proximal external carotid artery is normal. LEFT CAROTID SYSTEM: Mild common carotid atherosclerosis. Circumferential calcification of the proximal ICA without hemodynamically significant stenosis. Normal distal  ICA aside from minor calcification. There is mild narrowing of the origin of the external carotid artery due to noncalcified plaque. VERTEBRAL ARTERIES: Right dominant configuration. Poor visualization of the right origin. Left origin is patent. There is no dissection, occlusion or flow-limiting stenosis to the skull base (V1-V3 segments). CTA HEAD FINDINGS POSTERIOR CIRCULATION: --Vertebral arteries: Normal right V4 segment. There is loss of opacification of the left V4 segment distal to the PICA origin. --Posterior inferior cerebellar arteries (PICA): Patent origins from the vertebral arteries. --Anterior inferior cerebellar arteries (AICA): Right dominant --Basilar artery: Diminutive --Superior cerebellar arteries: Normal. --Posterior cerebral arteries: Normal. Both are predominantly supplied by the posterior communicating arteries (p-comm). ANTERIOR CIRCULATION: --Intracranial internal carotid arteries: Atherosclerotic calcification of the internal carotid arteries at the skull base without hemodynamically significant stenosis. --Anterior cerebral arteries (ACA): There is a new focus of calcification  in the distal right A2 segment (series 12, image 22) that may indicate partially calcified thrombus. However, the remainder of the vessel is normally opacified. Calcification in the left A2 segment is unchanged. Both A1 segments are present. Patent anterior communicating artery (a-comm). --Middle cerebral arteries (MCA): Normal. VENOUS SINUSES: As permitted by contrast timing, patent. ANATOMIC VARIANTS: Bilateral fetal origins of the posterior cerebral arteries. Review of the MIP images confirms the above findings. IMPRESSION: 1. No emergent large vessel occlusion or hemodynamically significant stenosis. 2. New focus of calcification in the right anterior cerebral artery distal A2 segment (s12:i22), which may indicate partially calcified thrombus. The vessel is patent distal to this, however. 3. Decreased  opacification of the distal V4 segment of the left vertebral artery, distal to the PICA origin, age-indeterminate. Bilateral fetal origins of the posterior cerebral arteries with diminutive vertebrobasilar system. 4. Status post recent coronary artery bypass graft with retrosternal fluid collection that measures 4.3 x 2.0 x 5.2 cm. In the immediate postoperative setting, this is favored to be a seroma. 5. Bilateral pleural effusions with large loculated component of the left effusion. 6. Aortic Atherosclerosis (ICD10-I70.0). Electronically Signed   By: Ulyses Jarred M.D.   On: 03/14/2019 19:36   Ct Head Wo Contrast  Result Date: 03/14/2019 CLINICAL DATA:  Stroke, follow-up. EXAM: CT HEAD WITHOUT CONTRAST TECHNIQUE: Contiguous axial images were obtained from the base of the skull through the vertex without intravenous contrast. COMPARISON:  CT head without contrast 03/13/2019 FINDINGS: Brain: Progressive hypoattenuation is noted within the right cingulate gyrus. Subjacent white matter hypoattenuation is progressed as well. No acute hemorrhage is noted. Precentral gyrus is intact. Periventricular white matter changes are otherwise stable. No significant extraaxial fluid collection is present. The brainstem and cerebellum are within normal limits. Vascular: Atherosclerotic changes are noted within the cavernous internal carotid arteries. There is no hyperdense vessel. Skull: Calvarium is intact. No focal lytic or blastic lesions are present. Sinuses/Orbits: Mild mucosal thickening is present within ethmoid air cells. No fluid levels are present. The globes and orbits are within normal limits. IMPRESSION: 1. Progressive right ACA territory nonhemorrhagic infarct. 2. Progressive adjacent white matter infarct. 3. Atherosclerosis. 4. Diffuse small vessel disease. Electronically Signed   By: San Morelle M.D.   On: 03/14/2019 12:12   Ct Angio Neck W Or Wo Contrast  Result Date: 03/14/2019 CLINICAL DATA:   Anterior cerebral artery territory infarct follow-up EXAM: CT ANGIOGRAPHY HEAD AND NECK TECHNIQUE: Multidetector CT imaging of the head and neck was performed using the standard protocol during bolus administration of intravenous contrast. Multiplanar CT image reconstructions and MIPs were obtained to evaluate the vascular anatomy. Carotid stenosis measurements (when applicable) are obtained utilizing NASCET criteria, using the distal internal carotid diameter as the denominator. CONTRAST:  44mL OMNIPAQUE IOHEXOL 350 MG/ML SOLN COMPARISON:  CTA head neck 10/23/2017 Head CT 03/13/2019 FINDINGS: CTA NECK FINDINGS SKELETON: There is no bony spinal canal stenosis. No lytic or blastic lesion. OTHER NECK: Status post recent coronary artery bypass graft with median sternotomy. There is a retrosternal fluid collection that measures 4.3 x 2.0 x 5.2 cm. UPPER CHEST: Bilateral pleural effusions. There is a large loculated component of the left effusion. AORTIC ARCH: There is moderate calcific atherosclerosis of the aortic arch. There is no aneurysm, dissection or hemodynamically significant stenosis of the visualized portion of the aorta. Normal variant aortic arch branching pattern with the brachiocephalic and left common carotid arteries sharing a common origin. The visualized proximal subclavian arteries are widely patent.  RIGHT CAROTID SYSTEM: There is mild narrowing of the proximal common carotid artery due to noncalcified plaque. There is circumferential atherosclerotic calcification at the carotid bifurcation and proximal internal carotid artery resulting in less than 50% stenosis. The distal internal carotid artery is widely patent. Proximal external carotid artery is normal. LEFT CAROTID SYSTEM: Mild common carotid atherosclerosis. Circumferential calcification of the proximal ICA without hemodynamically significant stenosis. Normal distal ICA aside from minor calcification. There is mild narrowing of the origin of  the external carotid artery due to noncalcified plaque. VERTEBRAL ARTERIES: Right dominant configuration. Poor visualization of the right origin. Left origin is patent. There is no dissection, occlusion or flow-limiting stenosis to the skull base (V1-V3 segments). CTA HEAD FINDINGS POSTERIOR CIRCULATION: --Vertebral arteries: Normal right V4 segment. There is loss of opacification of the left V4 segment distal to the PICA origin. --Posterior inferior cerebellar arteries (PICA): Patent origins from the vertebral arteries. --Anterior inferior cerebellar arteries (AICA): Right dominant --Basilar artery: Diminutive --Superior cerebellar arteries: Normal. --Posterior cerebral arteries: Normal. Both are predominantly supplied by the posterior communicating arteries (p-comm). ANTERIOR CIRCULATION: --Intracranial internal carotid arteries: Atherosclerotic calcification of the internal carotid arteries at the skull base without hemodynamically significant stenosis. --Anterior cerebral arteries (ACA): There is a new focus of calcification in the distal right A2 segment (series 12, image 22) that may indicate partially calcified thrombus. However, the remainder of the vessel is normally opacified. Calcification in the left A2 segment is unchanged. Both A1 segments are present. Patent anterior communicating artery (a-comm). --Middle cerebral arteries (MCA): Normal. VENOUS SINUSES: As permitted by contrast timing, patent. ANATOMIC VARIANTS: Bilateral fetal origins of the posterior cerebral arteries. Review of the MIP images confirms the above findings. IMPRESSION: 1. No emergent large vessel occlusion or hemodynamically significant stenosis. 2. New focus of calcification in the right anterior cerebral artery distal A2 segment (s12:i22), which may indicate partially calcified thrombus. The vessel is patent distal to this, however. 3. Decreased opacification of the distal V4 segment of the left vertebral artery, distal to the PICA  origin, age-indeterminate. Bilateral fetal origins of the posterior cerebral arteries with diminutive vertebrobasilar system. 4. Status post recent coronary artery bypass graft with retrosternal fluid collection that measures 4.3 x 2.0 x 5.2 cm. In the immediate postoperative setting, this is favored to be a seroma. 5. Bilateral pleural effusions with large loculated component of the left effusion. 6. Aortic Atherosclerosis (ICD10-I70.0). Electronically Signed   By: Ulyses Jarred M.D.   On: 03/14/2019 19:36   Dg Chest Port 1 View  Result Date: 03/15/2019 CLINICAL DATA:  Status post coronary artery bypass graft. EXAM: PORTABLE CHEST 1 VIEW COMPARISON:  Radiograph of March 14, 2019. FINDINGS: Stable cardiomegaly. Status post coronary bypass graft. Right-sided PICC line is unchanged in position. Stable bibasilar atelectasis is noted with associated pleural effusions. Stable left midlung opacity is noted most consistent with small hematoma related to prior chest tube. Bony thorax is unremarkable. IMPRESSION: Stable bibasilar atelectasis is noted with associated pleural effusions. Stable rounded density seen in left midlung most consistent with hematoma secondary to prior chest tube. Electronically Signed   By: Marijo Conception M.D.   On: 03/15/2019 08:33   Dg Chest Port 1v Same Day  Result Date: 03/14/2019 CLINICAL DATA:  Bedside PICC placement. EXAM: PORTABLE CHEST 1 VIEW 1:04 p.m.: COMPARISON:  Portable chest x-ray earlier same day at 5:29 a.m. and previously. FINDINGS: RIGHT arm PICC tip projects at or near the cavoatrial junction. RIGHT jugular introducer sheath tip projects  at the junction of the jugular and innominate veins. Prior sternotomy for CABG. Cardiac silhouette markedly enlarged. Normal pulmonary vascularity. Bibasilar atelectasis, unchanged since earlier in the day. Round opacity in the LEFT UPPER LOBE at the site of the prior chest tube, unchanged, presumably a small hematoma. No new pulmonary  parenchymal abnormalities. IMPRESSION: 1. RIGHT arm PICC tip projects at or near the cavoatrial junction. 2. Remaining support apparatus satisfactory. 3. Stable bibasilar atelectasis. 4. Stable rounded opacity in the LEFT upper lobe at the site of the prior chest tube, presumably a small hematoma. 5. No new abnormalities. Electronically Signed   By: Evangeline Dakin M.D.   On: 03/14/2019 13:18   [place] Ekg Site Rite  Result Date: 03/14/2019 If Site Rite image not attached, placement could not be confirmed due to current cardiac rhythm.   Cardiac Studies   Echo 03/08/19: IMPRESSIONS  1. Left ventricular ejection fraction, by visual estimation, is 55 to 60%. The left ventricle has normal function. Normal left ventricular size. There is mildly increased left ventricular hypertrophy. Basal inferior akinesis. 2. Left ventricular diastolic Doppler parameters are consistent with impaired relaxation pattern of LV diastolic filling. 3. Global right ventricle has normal systolic function.The right ventricular size is normal. No increase in right ventricular wall thickness. 4. The aortic valve is tricuspid Aortic valve regurgitation is trivial by color flow Doppler. Mild aortic valve stenosis. Mean gradient 10 mmHg. 5. There is mild dilatation of the ascending aorta measuring 42 mm. 6. Left atrial size was moderately dilated. 7. Right atrial size was mildly dilated. 8. The tricuspid valve is normal in structure. Tricuspid valve regurgitation was not visualized by color flow Doppler. 9. The mitral valve is normal in structure. No evidence of mitral valve regurgitation. No evidence of mitral stenosis. 10. TR signal is inadequate for assessing pulmonary artery systolic pressure. 11. The inferior vena cava is normal in size with greater than 50% respiratory variability, suggesting right atrial pressure of 3 mmHg.  Carotid Dopplers 03/09/2019: 1 to 39% ICA stenosis bilaterally.  Left heart cath  03/08/2019:  Severe three-vessel coronary calcification  Patent left main  Eccentric ostial 50 to 75% LAD with mid vessel 40 to 50% narrowing. Mid to distal LAD stent is widely patent.  Ramus intermedius contains proximal 90% stenosis.  Circumflex contains 99% proximal/ostial stenosis.  RCA is heavily calcified and stented. The artery is totally occluded in the proximal to mid segment. Left-to-right collaterals are noted.  Mildly reduced LV systolic function with EF estimated to be 40%. LVEDP is 28 mmHg. Findings compatible with acute on chronic combined   Patient Profile     Mr.Krishnamoorthy is a 63 yo M w/ PMH of prior CAD s/p pCI, PAF, HTN, OSA on CPAP presented with chest pain, found to have NSTEMI w/ multivessel dz requiring CABG. Hospital course complicated by acute ACA stroke.  Assessment & Plan    Acute ACA stroke Followed by neurology. PT/OT recommend CIR. LLE still w/ significant weakness. Speech cleared for oral meds - Appreciate neuro recs - Switch to Eliquis - CIR per PT/OT  NSTEMI Multi-vessel CAD Cath with multi-vessel dz. S/p CABG post op day 4. No significant chest pain per pt. Post-op Echo w/ no significant different. Some pitting edema on exam. - C/w asa, atorvastatin - Start DAPT when cleared by CT surgery.  Diastolic heart failure TEE post Cabg w/ preserved EF. 5L output yesterday. Renal fx improved - C/w furosemide 80 daily - Daily weights, strict I&Os - Trend/replete K, Mag  as appropriate  PAF Developed A.fib w/ RVR couple days ago. On amio. Still irregular but not in RVR. HR in 80s. Started on oral dilt yesterday. - C/w amio, metoprolol, diltiazem.  For questions or updates, please contact [facility] Please consult [email or web address] for contact info under Cardiology/STEMI.   Signed, Gilberto Better, MD PGY-2, [place] [facility] Pager: [phone] 03/16/2019, 7:31 AM    Attending attestation to follow

## 2019-03-17 LAB — CBC
HCT: 32 % — ABNORMAL LOW (ref 39.0–52.0)
Hemoglobin: 11 g/dL — ABNORMAL LOW (ref 13.0–17.0)
MCH: 33 pg (ref 26.0–34.0)
MCHC: 34.4 g/dL (ref 30.0–36.0)
MCV: 96.1 fL (ref 80.0–100.0)
Platelets: 244 10*3/uL (ref 150–400)
RBC: 3.33 MIL/uL — ABNORMAL LOW (ref 4.22–5.81)
RDW: 13.6 % (ref 11.5–15.5)
WBC: 7.9 10*3/uL (ref 4.0–10.5)
nRBC: 0.4 % — ABNORMAL HIGH (ref 0.0–0.2)

## 2019-03-17 LAB — COMPREHENSIVE METABOLIC PANEL
ALT: 40 U/L (ref 0–44)
AST: 50 U/L — ABNORMAL HIGH (ref 15–41)
Albumin: 2.2 g/dL — ABNORMAL LOW (ref 3.5–5.0)
Alkaline Phosphatase: 155 U/L — ABNORMAL HIGH (ref 38–126)
Anion gap: 13 (ref 5–15)
BUN: 21 mg/dL (ref 8–23)
CO2: 25 mmol/L (ref 22–32)
Calcium: 8.2 mg/dL — ABNORMAL LOW (ref 8.9–10.3)
Chloride: 90 mmol/L — ABNORMAL LOW (ref 98–111)
Creatinine, Ser: 0.86 mg/dL (ref 0.61–1.24)
GFR calc Af Amer: >60 mL/min (ref >60)
GFR calc non Af Amer: >60 mL/min (ref >60)
Glucose, Bld: 144 mg/dL — ABNORMAL HIGH (ref 70–99)
Potassium: 3.7 mmol/L (ref 3.5–5.1)
Sodium: 128 mmol/L — ABNORMAL LOW (ref 135–145)
Total Bilirubin: 1.2 mg/dL (ref 0.3–1.2)
Total Protein: 5.4 g/dL — ABNORMAL LOW (ref 6.5–8.1)

## 2019-03-17 LAB — COOXEMETRY PANEL
Carboxyhemoglobin: 1.3 % (ref 0.5–1.5)
Methemoglobin: 0.6 % (ref 0.0–1.5)
O2 Saturation: 63 %
Total hemoglobin: 11.1 g/dL — ABNORMAL LOW (ref 12.0–16.0)

## 2019-03-17 LAB — GLUCOSE, CAPILLARY
Glucose-Capillary: 127 mg/dL — ABNORMAL HIGH (ref 70–99)
Glucose-Capillary: 207 mg/dL — ABNORMAL HIGH (ref 70–99)
Glucose-Capillary: 115 mg/dL — ABNORMAL HIGH (ref 70–99)
Glucose-Capillary: 123 mg/dL — ABNORMAL HIGH (ref 70–99)

## 2019-03-17 LAB — MAGNESIUM: Magnesium: 1.8 mg/dL (ref 1.7–2.4)

## 2019-03-17 MED ORDER — FUROSEMIDE 10 MG/ML IJ SOLN
40.0000 mg | Freq: Two times a day (BID) | INTRAMUSCULAR | Status: DC
  Administered 2019-03-17 – 2019-03-20 (×7): 40 mg via INTRAVENOUS
  Filled 2019-03-17 (×7): qty 4

## 2019-03-17 MED ORDER — POTASSIUM CHLORIDE 10 MEQ/50ML IV SOLN
10.0000 meq | INTRAVENOUS | Status: AC
  Administered 2019-03-17 (×2): 10 meq via INTRAVENOUS
  Filled 2019-03-17 (×2): qty 50

## 2019-03-17 MED ORDER — AMIODARONE HCL 200 MG PO TABS
400.0000 mg | ORAL_TABLET | Freq: Two times a day (BID) | ORAL | Status: DC
  Administered 2019-03-17 – 2019-03-21 (×10): 400 mg via ORAL
  Filled 2019-03-17 (×10): qty 2

## 2019-03-17 MED ORDER — POTASSIUM CHLORIDE CRYS ER 20 MEQ PO TBCR
20.0000 meq | EXTENDED_RELEASE_TABLET | ORAL | Status: AC
  Administered 2019-03-17 (×3): 20 meq via ORAL
  Filled 2019-03-17 (×3): qty 1

## 2019-03-17 MED ORDER — POTASSIUM CHLORIDE CRYS ER 20 MEQ PO TBCR: 40.0000 meq | EXTENDED_RELEASE_TABLET | Freq: Once | ORAL | Status: DC

## 2019-03-17 NOTE — Progress Notes (Signed)
Progress Note  Patient Name: Martin [name] Date of Encounter: 03/17/2019  Primary Cardiologist: Kate Sable, MD   Subjective  O/N events: No seizure on EEG  Martin [name] was examined and evaluated at bedside this AM. He was observed eating breakfast in bed this am. He mentions his nausea/vomiting has resolved and was able to have a small bowel movement. Denies any chest pain, palpitations, dyspnea.  Inpatient Medications    Scheduled Meds:  allopurinol  300 mg Oral q morning - 10a   apixaban  5 mg Oral BID   aspirin EC  81 mg Oral Daily   atorvastatin  80 mg Oral q1800   bisacodyl  10 mg Rectal Daily   Or   bisacodyl  10 mg Oral Daily   Chlorhexidine Gluconate Cloth  6 each Topical Daily   diltiazem  120 mg Oral Q12H   docusate sodium  200 mg Oral Daily   fluticasone  1 spray Each Nare Daily   folic acid  1 mg Oral Daily   furosemide  80 mg Intravenous BID   guaiFENesin  600 mg Oral BID   insulin aspart  0-24 Units Subcutaneous TID AC & HS   insulin detemir  10 Units Subcutaneous Daily   metoprolol tartrate  25 mg Oral BID   mometasone-formoterol  2 puff Inhalation BID   pantoprazole  40 mg Oral Daily   sodium chloride flush  10-40 mL Intracatheter Q12H   sodium chloride flush  3 mL Intravenous Q12H   thiamine  100 mg Intravenous Daily   vitamin B-12  100 mcg Oral Daily   Continuous Infusions:  sodium chloride Stopped (03/11/19 1110)   sodium chloride     sodium chloride 10 mL/hr at 03/10/19 1428   amiodarone 30 mg/hr (03/17/19 0635)   PRN Meds: sodium chloride, ALPRAZolam, hydrALAZINE, ipratropium-albuterol, magnesium citrate, morphine injection, ondansetron (ZOFRAN) IV, oxyCODONE, sodium chloride flush, sodium chloride flush, traMADol   Vital Signs    Vitals:   03/17/19 0300 03/17/19 0400 03/17/19 0500 03/17/19 0600  BP: 113/68 95/74 (!) 149/91 (!) 89/62  Pulse: 80 72 90 79  Resp: 18 18 (!) 21 15  Temp: 98.2 F (36.8 C)      TempSrc: Oral     SpO2: 92% 90% 91% 94%  Weight:   128.4 kg   Height:        Intake/Output Summary (Last 24 hours) at 03/17/2019 0708 Last data filed at 03/17/2019 0600 Gross per 24 hour  Intake 998.05 ml  Output 3675 ml  Net -2676.95 ml   Filed Weights   03/15/19 0444 03/16/19 0500 03/17/19 0500  Weight: 131.2 kg 127.7 kg 128.4 kg    Telemetry    Irregularly irregular but not RVR HR ~80-100 - Personally Reviewed  ECG    None from this am - Personally Reviewed  Physical Exam   Gen: Well-developed, obese, NAD HEENT: NCAT head, hearing intact, EOMI, CPAP in place and functioning Neck: supple, ROM intact, no JVD CV: RRR, S1, S2 normal, No rubs, no murmurs, no gallops Pulm: CTAB no rales, no wheezes Extm: Peripheral pulses intact, pitting edema upper extremities Skin: Dry, Warm, normal turgor  Neuro: AAOx3, Continued lack of strength on LLE  Labs    Chemistry Recent Labs  Lab 03/15/19 0437 03/16/19 0431 03/16/19 1239 03/17/19 0408  NA 126* 127* 125* 128*  K 3.8 3.2* 3.9 3.7  CL 92* 90*  --  90*  CO2 23 24  --  25  GLUCOSE  142* 154*  --  144*  BUN 31* 25*  --  21  CREATININE 0.97 0.82  --  0.86  CALCIUM 8.5* 8.1*  --  8.2*  PROT 5.7* 5.4*  --  5.4*  ALBUMIN 2.3* 2.1*  --  2.2*  AST 31 29  --  50*  ALT 29 29  --  40  ALKPHOS 126 160*  --  155*  BILITOT 1.4* 1.2  --  1.2  GFRNONAA >60 >60  --  >60  GFRAA >60 >60  --  >60  ANIONGAP 11 13  --  13     Hematology Recent Labs  Lab 03/15/19 0437 03/16/19 0431 03/16/19 1239 03/17/19 0408  WBC 10.4 6.7  --  7.9  RBC 3.10* 3.38*  --  3.33*  HGB 10.6* 11.2* 12.2* 11.0*  HCT 30.6* 32.5* 36.0* 32.0*  MCV 98.7 96.2  --  96.1  MCH 34.2* 33.1  --  33.0  MCHC 34.6 34.5  --  34.4  RDW 13.3 13.2  --  13.6  PLT 167 211  --  244    Cardiac EnzymesNo results for input(s): TROPONINI in the last 168 hours. No results for input(s): TROPIPOC in the last 168 hours.   BNP No results for input(s): BNP, PROBNP in  the last 168 hours.   DDimer No results for input(s): DDIMER in the last 168 hours.   Radiology    Ct Head Wo Contrast  Result Date: 03/16/2019 CLINICAL DATA:  63 year old male with altered mental status. Right ACA territory infarct. Episode of unresponsiveness. EXAM: CT HEAD WITHOUT CONTRAST TECHNIQUE: Contiguous axial images were obtained from the base of the skull through the vertex without intravenous contrast. COMPARISON:  CT head and CTA head and neck 03/14/2019. FINDINGS: Brain: Cytotoxic edema in the distal right ACA territory on series 3, image 30 has not significantly changed. No associated hemorrhage or mass effect. There is additional patchy hypodensity in the right MCA/ACA watershed white matter on series 3, image 25 which is stable. Lesser left hemisphere white matter hypodensity. No midline shift, mass effect, or evidence of intracranial mass lesion. No ventriculomegaly. No new cortically based infarct. Vascular: Calcified atherosclerosis at the skull base. No suspicious intracranial vascular hyperdensity. a Skull: No acute osseous abnormality identified. Sinuses/Orbits: Mildly increased paranasal sinus mucosal thickening and opacity. Tympanic cavities and mastoids remain clear. Other: No acute orbit or scalp soft tissue findings. IMPRESSION: 1. Stable CT appearance of the distal right ACA and right ACA/MCA watershed ischemia. No associated hemorrhage or mass effect. Underlying chronic white matter disease. 2. No new intracranial abnormality. Electronically Signed   By: Genevie Ann M.D.   On: 03/16/2019 13:38   Dg Chest Port 1 View  Result Date: 03/16/2019 CLINICAL DATA:  63 year old male with shortness of breath, pleural effusion. Status post CABG postoperative day 6. EXAM: PORTABLE CHEST 1 VIEW COMPARISON:  Portable chest 03/15/2019 and earlier. FINDINGS: Portable AP semi upright view at 0527 hours. Right IJ introducer sheath is removed. Right PICC line remains. Stable cardiomegaly and lung  volumes. No pneumothorax or pulmonary edema. Veiling lung base opacity and dense retrocardiac opacity. Superimposed nodular probable segmental atelectasis near the left hilum. No acute osseous abnormality identified. IMPRESSION: 1. Right IJ introducer sheath removed.  Stable right PICC line. 2. Evidence of bilateral pleural effusions with retrocardiac and left perihilar atelectasis. Electronically Signed   By: Genevie Ann M.D.   On: 03/16/2019 08:17    Cardiac Studies   Echo 03/08/19: IMPRESSIONS  1. Left ventricular ejection fraction, by visual estimation, is 55 to 60%. The left ventricle has normal function. Normal left ventricular size. There is mildly increased left ventricular hypertrophy. Basal inferior akinesis. 2. Left ventricular diastolic Doppler parameters are consistent with impaired relaxation pattern of LV diastolic filling. 3. Global right ventricle has normal systolic function.The right ventricular size is normal. No increase in right ventricular wall thickness. 4. The aortic valve is tricuspid Aortic valve regurgitation is trivial by color flow Doppler. Mild aortic valve stenosis. Mean gradient 10 mmHg. 5. There is mild dilatation of the ascending aorta measuring 42 mm. 6. Left atrial size was moderately dilated. 7. Right atrial size was mildly dilated. 8. The tricuspid valve is normal in structure. Tricuspid valve regurgitation was not visualized by color flow Doppler. 9. The mitral valve is normal in structure. No evidence of mitral valve regurgitation. No evidence of mitral stenosis. 10. TR signal is inadequate for assessing pulmonary artery systolic pressure. 11. The inferior vena cava is normal in size with greater than 50% respiratory variability, suggesting right atrial pressure of 3 mmHg.  Carotid Dopplers 03/09/2019: 1 to 39% ICA stenosis bilaterally.  Left heart cath 03/08/2019:  Severe three-vessel coronary calcification  Patent left main  Eccentric ostial  50 to 75% LAD with mid vessel 40 to 50% narrowing. Mid to distal LAD stent is widely patent.  Ramus intermedius contains proximal 90% stenosis.  Circumflex contains 99% proximal/ostial stenosis.  RCA is heavily calcified and stented. The artery is totally occluded in the proximal to mid segment. Left-to-right collaterals are noted.  Mildly reduced LV systolic function with EF estimated to be 40%. LVEDP is 28 mmHg. Findings compatible with acute on chronic combined   Patient Profile     Martin [name] is a 63 yo M w/ PMH of prior CAD s/p pCI, PAF, HTN, OSA on CPAP presented with chest pain, found to have NSTEMI w/ multivessel dz requiring CABG. Hospital course complicated by acute ACA stroke.  Assessment & Plan    Acute ACA stroke Followed by neurology. PT/OT recommend CIR. LLE still w/ significant weakness. Speech cleared for oral meds - Appreciate neuro recs - C/w Eliquis - CIR per PT/OT  NSTEMI Multi-vessel CAD Cath with multi-vessel dz. S/p CABG post op day 6. No significant chest pain per pt. Post-op Echo w/ no significant different.. - C/w asa, atorvastatin - Start DAPT when cleared by CT surgery.  Diastolic heart failure TEE post Cabg w/ preserved EF. 3.7L output yesterday. Weight at baseline. Renal fx much improved. Creatinine this am 0.8 - C/w furosemide 80 BID - Daily weights, strict I&Os - Trend/replete K, Mag as appropriate  PAF Developed A.fib w/ RVR couple days ago. On amio. Still irregular but not in RVR. Restarted on IV amio bolus yesterday due to intermittent tach ycardia. Current rate ~90.  - C/w metoprolol, diltiazem. - D/c IV amio - Start oral amio 200mg  BID - C/w Eliquis  For questions or updates, please contact [facility] Please consult [email or web address] for contact info under Cardiology/STEMI.   Signed, Gilberto Better, MD PGY-2, [facility] Pager: [phone] 03/17/2019, 7:08 AM    Attending attestation to follow

## 2019-03-17 NOTE — Progress Notes (Signed)
STROKE TEAM PROGRESS NOTE   SUBJECTIVE (INTERVAL HISTORY) Wife at bedside. Pt awake alert orientated x 3. Left UE and LE improved. LUE 3/5 and LLE 2/5. EEG no seizure.    OBJECTIVE Temp:  [98 F (36.7 C)-99.9 F (37.7 C)] 98.6 F (37 C) (10/09 0700) Pulse Rate:  [72-107] 98 (10/09 1000) Cardiac Rhythm: Atrial fibrillation (10/09 0800) Resp:  [15-29] 25 (10/09 1000) BP: (89-157)/(57-114) 114/58 (10/09 1000) SpO2:  [88 %-97 %] 95 % (10/09 1000) Weight:  [128.4 kg] 128.4 kg (10/09 0500)  Recent Labs  Lab 03/16/19 0631 03/16/19 1228 03/16/19 1621 03/16/19 2143 03/17/19 0707  GLUCAP 109* 186* 110* 136* 127*   Recent Labs  Lab 03/11/19 0409  03/11/19 1712  03/12/19 [id number] 03/13/19 0435 03/14/19 0449 03/15/19 0437 03/16/19 0431 03/16/19 1239 03/17/19 0408  NA 134*   < > 134*   < > 132* 129* 129* 126* 127* 125* 128*  K 3.9   < > 4.2   < > 4.0 3.8 3.8 3.8 3.2* 3.9 3.7  CL 105  --  101   < > 99 94* 92* 92* 90*  --  90*  CO2 21*  --  23   < > 24 25 25 23 24   --  25  GLUCOSE 134*  --  174*   < > 181* 134* 158* 142* 154*  --  144*  BUN 14  --  20   < > 27* 36* 35* 31* 25*  --  21  CREATININE 0.88  --  1.42*   < > 2.00* 2.21* 1.33* 0.97 0.82  --  0.86  CALCIUM 7.7*  --  8.3*   < > 8.2* 8.3* 8.7* 8.5* 8.1*  --  8.2*  MG 2.2  --  2.1  --  2.1  --   --   --  1.9  --  1.8   < > = values in this interval not displayed.   Recent Labs  Lab 03/13/19 0435 03/14/19 0449 03/15/19 0437 03/16/19 0431 03/17/19 0408  AST 26 26 31 29  50*  ALT 22 23 29 29  40  ALKPHOS 53 73 126 160* 155*  BILITOT 0.8 1.0 1.4* 1.2 1.2  PROT 5.4* 5.4* 5.7* 5.4* 5.4*  ALBUMIN 2.7* 2.4* 2.3* 2.1* 2.2*   Recent Labs  Lab 03/13/19 0435 03/14/19 0449 03/15/19 0437 03/16/19 0431 03/16/19 1239 03/17/19 0408  WBC 12.8* 10.8* 10.4 6.7  --  7.9  HGB 11.0* 11.0* 10.6* 11.2* 12.2* 11.0*  HCT 32.3* 31.9* 30.6* 32.5* 36.0* 32.0*  MCV 98.8 97.9 98.7 96.2  --  96.1  PLT 127* 163 167 211  --  244   No results  for input(s): CKTOTAL, CKMB, CKMBINDEX, TROPONINI in the last 168 hours. No results for input(s): LABPROT, INR in the last 72 hours. No results for input(s): COLORURINE, LABSPEC, [place], GLUCOSEU, HGBUR, BILIRUBINUR, KETONESUR, PROTEINUR, UROBILINOGEN, NITRITE, LEUKOCYTESUR in the last 72 hours.  Invalid input(s): APPERANCEUR     Component Value Date/Time   CHOL 170 03/07/2019 2358   TRIG 218 (H) 03/07/2019 2358   HDL 41 03/07/2019 2358   CHOLHDL 4.1 03/07/2019 2358   VLDL 44 (H) 03/07/2019 2358   LDLCALC 85 03/07/2019 2358   Lab Results  Component Value Date   HGBA1C 5.6 03/09/2019      Component Value Date/Time   LABOPIA NONE DETECTED 05/20/2010 1326   COCAINSCRNUR NONE DETECTED 05/20/2010 1326   LABBENZ POSITIVE (A) 05/20/2010 1326   AMPHETMU NONE DETECTED 05/20/2010 1326  THCU NONE DETECTED 05/20/2010 1326   LABBARB  05/20/2010 1326    NONE DETECTED        DRUG SCREEN FOR MEDICAL PURPOSES ONLY.  IF CONFIRMATION IS NEEDED FOR ANY PURPOSE, NOTIFY LAB WITHIN 5 DAYS.        LOWEST DETECTABLE LIMITS FOR URINE DRUG SCREEN Drug Class       Cutoff (ng/mL) Amphetamine      1000 Barbiturate      200 Benzodiazepine   [id number] Tricyclics       [id number] Opiates          300 Cocaine          300 THC              50    No results for input(s): ETH in the last 168 hours.  I have personally reviewed the radiological images below and agree with the radiology interpretations.  Ct Angio Head W Or Wo Contrast  Result Date: 03/14/2019 CLINICAL DATA:  Anterior cerebral artery territory infarct follow-up EXAM: CT ANGIOGRAPHY HEAD AND NECK TECHNIQUE: Multidetector CT imaging of the head and neck was performed using the standard protocol during bolus administration of intravenous contrast. Multiplanar CT image reconstructions and MIPs were obtained to evaluate the vascular anatomy. Carotid stenosis measurements (when applicable) are obtained utilizing NASCET criteria, using the distal internal  carotid diameter as the denominator. CONTRAST:  41mL OMNIPAQUE IOHEXOL 350 MG/ML SOLN COMPARISON:  CTA head neck 10/23/2017 Head CT 03/13/2019 FINDINGS: CTA NECK FINDINGS SKELETON: There is no bony spinal canal stenosis. No lytic or blastic lesion. OTHER NECK: Status post recent coronary artery bypass graft with median sternotomy. There is a retrosternal fluid collection that measures 4.3 x 2.0 x 5.2 cm. UPPER CHEST: Bilateral pleural effusions. There is a large loculated component of the left effusion. AORTIC ARCH: There is moderate calcific atherosclerosis of the aortic arch. There is no aneurysm, dissection or hemodynamically significant stenosis of the visualized portion of the aorta. Normal variant aortic arch branching pattern with the brachiocephalic and left common carotid arteries sharing a common origin. The visualized proximal subclavian arteries are widely patent. RIGHT CAROTID SYSTEM: There is mild narrowing of the proximal common carotid artery due to noncalcified plaque. There is circumferential atherosclerotic calcification at the carotid bifurcation and proximal internal carotid artery resulting in less than 50% stenosis. The distal internal carotid artery is widely patent. Proximal external carotid artery is normal. LEFT CAROTID SYSTEM: Mild common carotid atherosclerosis. Circumferential calcification of the proximal ICA without hemodynamically significant stenosis. Normal distal ICA aside from minor calcification. There is mild narrowing of the origin of the external carotid artery due to noncalcified plaque. VERTEBRAL ARTERIES: Right dominant configuration. Poor visualization of the right origin. Left origin is patent. There is no dissection, occlusion or flow-limiting stenosis to the skull base (V1-V3 segments). CTA HEAD FINDINGS POSTERIOR CIRCULATION: --Vertebral arteries: Normal right V4 segment. There is loss of opacification of the left V4 segment distal to the PICA origin. --Posterior  inferior cerebellar arteries (PICA): Patent origins from the vertebral arteries. --Anterior inferior cerebellar arteries (AICA): Right dominant --Basilar artery: Diminutive --Superior cerebellar arteries: Normal. --Posterior cerebral arteries: Normal. Both are predominantly supplied by the posterior communicating arteries (p-comm). ANTERIOR CIRCULATION: --Intracranial internal carotid arteries: Atherosclerotic calcification of the internal carotid arteries at the skull base without hemodynamically significant stenosis. --Anterior cerebral arteries (ACA): There is a new focus of calcification in the distal right A2 segment (series 12, image 22) that may indicate partially calcified thrombus.  However, the remainder of the vessel is normally opacified. Calcification in the left A2 segment is unchanged. Both A1 segments are present. Patent anterior communicating artery (a-comm). --Middle cerebral arteries (MCA): Normal. VENOUS SINUSES: As permitted by contrast timing, patent. ANATOMIC VARIANTS: Bilateral fetal origins of the posterior cerebral arteries. Review of the MIP images confirms the above findings. IMPRESSION: 1. No emergent large vessel occlusion or hemodynamically significant stenosis. 2. New focus of calcification in the right anterior cerebral artery distal A2 segment (s12:i22), which may indicate partially calcified thrombus. The vessel is patent distal to this, however. 3. Decreased opacification of the distal V4 segment of the left vertebral artery, distal to the PICA origin, age-indeterminate. Bilateral fetal origins of the posterior cerebral arteries with diminutive vertebrobasilar system. 4. Status post recent coronary artery bypass graft with retrosternal fluid collection that measures 4.3 x 2.0 x 5.2 cm. In the immediate postoperative setting, this is favored to be a seroma. 5. Bilateral pleural effusions with large loculated component of the left effusion. 6. Aortic Atherosclerosis (ICD10-I70.0).  Electronically Signed   By: Ulyses Jarred M.D.   On: 03/14/2019 19:36   Dg Chest 1 View  Result Date: 03/10/2019 CLINICAL DATA:  Status post CABG, rule out pneumo. EXAM: CHEST  1 VIEW COMPARISON:  03/07/2019 FINDINGS: Now status post median sternotomy and CABG. Swan-Ganz catheter in proximal right pulmonary artery. A transesophageal echocardiographic pro passes to the level of the T10 vertebral body. Seven intact sternal wires are noted. Left-sided chest tube in place without signs of pneumothorax on supine radiograph. Cardiomediastinal contours with mild widening superiorly likely secondary to postoperative change otherwise unchanged accounting for low lung volumes. There is a single tube in the midline projecting over sternal wires extending to the level of the sternal manubrium. This may represent mediastinal drain. IMPRESSION: 1. Status post CABG with left chest tube without visible pneumothorax. 2. Swan-Ganz catheter and transesophageal echocardiographic probe in place. 3. Midline drainage tubing projects over the mediastinum, potentially mediastinal drain, clinical correlation suggested. Electronically Signed   By: Zetta Bills M.D.   On: 03/10/2019 13:30   Dg Chest 2 View  Result Date: 03/07/2019 CLINICAL DATA:  Chest pain for 2 weeks. EXAM: CHEST - 2 VIEW COMPARISON:  10/28/2017 and prior radiographs FINDINGS: Cardiomegaly and LEFT basilar scarring again noted. There is no evidence of focal airspace disease, pulmonary edema, suspicious pulmonary nodule/mass, pleural effusion, or pneumothorax. No acute bony abnormalities are identified. IMPRESSION: Cardiomegaly without evidence of acute cardiopulmonary disease. Electronically Signed   By: Margarette Canada M.D.   On: 03/07/2019 14:30   Ct Head Wo Contrast  Result Date: 03/16/2019 CLINICAL DATA:  63 year old male with altered mental status. Right ACA territory infarct. Episode of unresponsiveness. EXAM: CT HEAD WITHOUT CONTRAST TECHNIQUE: Contiguous  axial images were obtained from the base of the skull through the vertex without intravenous contrast. COMPARISON:  CT head and CTA head and neck 03/14/2019. FINDINGS: Brain: Cytotoxic edema in the distal right ACA territory on series 3, image 40 has not significantly changed. No associated hemorrhage or mass effect. There is additional patchy hypodensity in the right MCA/ACA watershed white matter on series 3, image 25 which is stable. Lesser left hemisphere white matter hypodensity. No midline shift, mass effect, or evidence of intracranial mass lesion. No ventriculomegaly. No new cortically based infarct. Vascular: Calcified atherosclerosis at the skull base. No suspicious intracranial vascular hyperdensity. a Skull: No acute osseous abnormality identified. Sinuses/Orbits: Mildly increased paranasal sinus mucosal thickening and opacity. Tympanic cavities and  mastoids remain clear. Other: No acute orbit or scalp soft tissue findings. IMPRESSION: 1. Stable CT appearance of the distal right ACA and right ACA/MCA watershed ischemia. No associated hemorrhage or mass effect. Underlying chronic white matter disease. 2. No new intracranial abnormality. Electronically Signed   By: Genevie Ann M.D.   On: 03/16/2019 13:38   Ct Head Wo Contrast  Result Date: 03/14/2019 CLINICAL DATA:  Stroke, follow-up. EXAM: CT HEAD WITHOUT CONTRAST TECHNIQUE: Contiguous axial images were obtained from the base of the skull through the vertex without intravenous contrast. COMPARISON:  CT head without contrast 03/13/2019 FINDINGS: Brain: Progressive hypoattenuation is noted within the right cingulate gyrus. Subjacent white matter hypoattenuation is progressed as well. No acute hemorrhage is noted. Precentral gyrus is intact. Periventricular white matter changes are otherwise stable. No significant extraaxial fluid collection is present. The brainstem and cerebellum are within normal limits. Vascular: Atherosclerotic changes are noted within  the cavernous internal carotid arteries. There is no hyperdense vessel. Skull: Calvarium is intact. No focal lytic or blastic lesions are present. Sinuses/Orbits: Mild mucosal thickening is present within ethmoid air cells. No fluid levels are present. The globes and orbits are within normal limits. IMPRESSION: 1. Progressive right ACA territory nonhemorrhagic infarct. 2. Progressive adjacent white matter infarct. 3. Atherosclerosis. 4. Diffuse small vessel disease. Electronically Signed   By: San Morelle M.D.   On: 03/14/2019 12:12   Ct Head Wo Contrast  Result Date: 03/13/2019 CLINICAL DATA:  Generalized muscle weakness. Left-sided weakness EXAM: CT HEAD WITHOUT CONTRAST TECHNIQUE: Contiguous axial images were obtained from the base of the skull through the vertex without intravenous contrast. COMPARISON:  Brain MRI 04/18/2018 FINDINGS: Brain: Small area of cytotoxic appearance in the parasagittal and posterior right frontal region, distal ACA territory. No hemorrhage, hydrocephalus, or masslike finding. Low-density in the cerebral white matter that is fairly symmetric and likely chronic microvascular ischemia but progressed from MR. Small cortically based calcification along the right hemisphere, nonspecific in isolation. Vascular: Atherosclerotic calcification. Skull: Negative. Sinuses/Orbits: Negative. Other: These results were communicated to Dr. Lorraine Lax at 6:52 amon 10/5/2020by text page via the [facility] messaging system. IMPRESSION: 1. Small acute cortical infarct in the distal right ACA territory. 2. Cerebral white matter low-density that is mild and symmetric but may have progressed from 2019, question right watershed white matter infarcts as well. Electronically Signed   By: Monte Fantasia M.D.   On: 03/13/2019 06:58   Ct Angio Neck W Or Wo Contrast  Result Date: 03/14/2019 CLINICAL DATA:  Anterior cerebral artery territory infarct follow-up EXAM: CT ANGIOGRAPHY HEAD AND NECK TECHNIQUE:  Multidetector CT imaging of the head and neck was performed using the standard protocol during bolus administration of intravenous contrast. Multiplanar CT image reconstructions and MIPs were obtained to evaluate the vascular anatomy. Carotid stenosis measurements (when applicable) are obtained utilizing NASCET criteria, using the distal internal carotid diameter as the denominator. CONTRAST:  62mL OMNIPAQUE IOHEXOL 350 MG/ML SOLN COMPARISON:  CTA head neck 10/23/2017 Head CT 03/13/2019 FINDINGS: CTA NECK FINDINGS SKELETON: There is no bony spinal canal stenosis. No lytic or blastic lesion. OTHER NECK: Status post recent coronary artery bypass graft with median sternotomy. There is a retrosternal fluid collection that measures 4.3 x 2.0 x 5.2 cm. UPPER CHEST: Bilateral pleural effusions. There is a large loculated component of the left effusion. AORTIC ARCH: There is moderate calcific atherosclerosis of the aortic arch. There is no aneurysm, dissection or hemodynamically significant stenosis of the visualized portion of the aorta. Normal  variant aortic arch branching pattern with the brachiocephalic and left common carotid arteries sharing a common origin. The visualized proximal subclavian arteries are widely patent. RIGHT CAROTID SYSTEM: There is mild narrowing of the proximal common carotid artery due to noncalcified plaque. There is circumferential atherosclerotic calcification at the carotid bifurcation and proximal internal carotid artery resulting in less than 50% stenosis. The distal internal carotid artery is widely patent. Proximal external carotid artery is normal. LEFT CAROTID SYSTEM: Mild common carotid atherosclerosis. Circumferential calcification of the proximal ICA without hemodynamically significant stenosis. Normal distal ICA aside from minor calcification. There is mild narrowing of the origin of the external carotid artery due to noncalcified plaque. VERTEBRAL ARTERIES: Right dominant  configuration. Poor visualization of the right origin. Left origin is patent. There is no dissection, occlusion or flow-limiting stenosis to the skull base (V1-V3 segments). CTA HEAD FINDINGS POSTERIOR CIRCULATION: --Vertebral arteries: Normal right V4 segment. There is loss of opacification of the left V4 segment distal to the PICA origin. --Posterior inferior cerebellar arteries (PICA): Patent origins from the vertebral arteries. --Anterior inferior cerebellar arteries (AICA): Right dominant --Basilar artery: Diminutive --Superior cerebellar arteries: Normal. --Posterior cerebral arteries: Normal. Both are predominantly supplied by the posterior communicating arteries (p-comm). ANTERIOR CIRCULATION: --Intracranial internal carotid arteries: Atherosclerotic calcification of the internal carotid arteries at the skull base without hemodynamically significant stenosis. --Anterior cerebral arteries (ACA): There is a new focus of calcification in the distal right A2 segment (series 12, image 22) that may indicate partially calcified thrombus. However, the remainder of the vessel is normally opacified. Calcification in the left A2 segment is unchanged. Both A1 segments are present. Patent anterior communicating artery (a-comm). --Middle cerebral arteries (MCA): Normal. VENOUS SINUSES: As permitted by contrast timing, patent. ANATOMIC VARIANTS: Bilateral fetal origins of the posterior cerebral arteries. Review of the MIP images confirms the above findings. IMPRESSION: 1. No emergent large vessel occlusion or hemodynamically significant stenosis. 2. New focus of calcification in the right anterior cerebral artery distal A2 segment (s12:i22), which may indicate partially calcified thrombus. The vessel is patent distal to this, however. 3. Decreased opacification of the distal V4 segment of the left vertebral artery, distal to the PICA origin, age-indeterminate. Bilateral fetal origins of the posterior cerebral arteries with  diminutive vertebrobasilar system. 4. Status post recent coronary artery bypass graft with retrosternal fluid collection that measures 4.3 x 2.0 x 5.2 cm. In the immediate postoperative setting, this is favored to be a seroma. 5. Bilateral pleural effusions with large loculated component of the left effusion. 6. Aortic Atherosclerosis (ICD10-I70.0). Electronically Signed   By: Ulyses Jarred M.D.   On: 03/14/2019 19:36   Dg Chest Port 1 View  Result Date: 03/16/2019 CLINICAL DATA:  63 year old male with shortness of breath, pleural effusion. Status post CABG postoperative day 6. EXAM: PORTABLE CHEST 1 VIEW COMPARISON:  Portable chest 03/15/2019 and earlier. FINDINGS: Portable AP semi upright view at 0527 hours. Right IJ introducer sheath is removed. Right PICC line remains. Stable cardiomegaly and lung volumes. No pneumothorax or pulmonary edema. Veiling lung base opacity and dense retrocardiac opacity. Superimposed nodular probable segmental atelectasis near the left hilum. No acute osseous abnormality identified. IMPRESSION: 1. Right IJ introducer sheath removed.  Stable right PICC line. 2. Evidence of bilateral pleural effusions with retrocardiac and left perihilar atelectasis. Electronically Signed   By: Genevie Ann M.D.   On: 03/16/2019 08:17   Dg Chest Port 1 View  Result Date: 03/15/2019 CLINICAL DATA:  Status post coronary artery bypass  graft. EXAM: PORTABLE CHEST 1 VIEW COMPARISON:  Radiograph of March 14, 2019. FINDINGS: Stable cardiomegaly. Status post coronary bypass graft. Right-sided PICC line is unchanged in position. Stable bibasilar atelectasis is noted with associated pleural effusions. Stable left midlung opacity is noted most consistent with small hematoma related to prior chest tube. Bony thorax is unremarkable. IMPRESSION: Stable bibasilar atelectasis is noted with associated pleural effusions. Stable rounded density seen in left midlung most consistent with hematoma secondary to prior  chest tube. Electronically Signed   By: Marijo Conception M.D.   On: 03/15/2019 08:33   Dg Chest Port 1 View  Result Date: 03/14/2019 CLINICAL DATA:  Short of breath.  Status post CABG surgery. EXAM: PORTABLE CHEST 1 VIEW COMPARISON:  03/13/2019 and earlier exams. FINDINGS: Left chest tube has been removed. There is opacity corresponding to the chest tube tract consistent with fluid/atelectasis. No pneumothorax. Persistent opacity noted at the bases consistent with a combination of pleural effusions and atelectasis. No convincing pulmonary edema. No mediastinal widening.  Cardiac silhouette is enlarged but stable. Stable right internal jugular Cordis. IMPRESSION: 1. Status post removal of the left chest tube.  No pneumothorax. 2. Persistent lung base opacity consistent with a combination of pleural effusions and atelectasis. Electronically Signed   By: Lajean Manes M.D.   On: 03/14/2019 08:23   Dg Chest Port 1 View  Result Date: 03/13/2019 CLINICAL DATA:  Chest tube placement EXAM: PORTABLE CHEST 1 VIEW COMPARISON:  03/12/2019 FINDINGS: The left-sided chest tube is stable in positioning. The right-sided central venous catheter is stable in positioning. The heart size remains significantly enlarged. The patient is status post prior median sternotomy. There are bilateral pleural effusions with adjacent atelectasis. There is no pneumothorax. There is generalized volume overload. IMPRESSION: 1. Lines and tubes as above. 2. No pneumothorax. 3. Cardiomegaly with persistent bilateral pleural effusions and adjacent atelectasis. Electronically Signed   By: Constance Holster M.D.   On: 03/13/2019 08:02   Dg Chest Port 1 View  Result Date: 03/12/2019 CLINICAL DATA:  Status post surgery. EXAM: PORTABLE CHEST 1 VIEW COMPARISON:  03/11/2019 FINDINGS: Interval removal of the Swan-Ganz catheter. Right jugular central venous sheath remains in place. Left-sided chest tube in unchanged position. No left pneumothorax. Mild  left basilar atelectasis. Right basilar airspace disease likely reflecting atelectasis with a small right pleural effusion. No right pneumothorax. Stable cardiomediastinal silhouette. Recent CABG. IMPRESSION: 1. Left-sided chest tube in unchanged position. No left pneumothorax. Mild left basilar atelectasis. 2. Right basilar atelectasis with a small right pleural effusion. Electronically Signed   By: Kathreen Devoid   On: 03/12/2019 09:11   Dg Chest Port 1 View  Result Date: 03/11/2019 CLINICAL DATA:  CABG x3 03/10/2019. EXAM: PORTABLE CHEST 1 VIEW COMPARISON:  03/10/2019 FINDINGS: Right IJ Swan-Ganz catheter unchanged with tip over the right main pulmonary artery. Left-sided chest tube unchanged. Mediastinal drain unchanged. Lungs are adequately inflated with very minimal bibasilar density likely small amount of bilateral pleural fluid/atelectasis. Mild stable cardiomegaly. Remainder of the exam is unchanged. IMPRESSION: Suggestion of small amount of bilateral pleural fluid/atelectasis. Stable cardiomegaly. Tubes and lines as described. Electronically Signed   By: Marin Olp M.D.   On: 03/11/2019 08:46   Dg Chest Port 1 View  Result Date: 03/10/2019 CLINICAL DATA:  S/p CABG x 3, chest tube in place, pt intubated. EXAM: PORTABLE CHEST 1 VIEW COMPARISON:  Chest radiograph 03/10/2019 at 1:09 p.m. FINDINGS: Stable enlarged cardiomediastinal contours status post median sternotomy. Interval intubation with endotracheal  tube tip projecting between the thoracic inlet and carina. Stable remaining support apparatus including a PA catheter, enteric tube and left chest tube. The lungs are well aerated. No new focal pulmonary opacity. No pneumothorax or large pleural effusion. IMPRESSION: 1. Interval intubation with endotracheal tube tip projecting between the thoracic inlet and carina. 2. Postoperative appearance of the chest. Electronically Signed   By: Audie Pinto M.D.   On: 03/10/2019 15:08   Dg Chest Port  1v Same Day  Result Date: 03/14/2019 CLINICAL DATA:  Bedside PICC placement. EXAM: PORTABLE CHEST 1 VIEW 1:04 p.m.: COMPARISON:  Portable chest x-ray earlier same day at 5:29 a.m. and previously. FINDINGS: RIGHT arm PICC tip projects at or near the cavoatrial junction. RIGHT jugular introducer sheath tip projects at the junction of the jugular and innominate veins. Prior sternotomy for CABG. Cardiac silhouette markedly enlarged. Normal pulmonary vascularity. Bibasilar atelectasis, unchanged since earlier in the day. Round opacity in the LEFT UPPER LOBE at the site of the prior chest tube, unchanged, presumably a small hematoma. No new pulmonary parenchymal abnormalities. IMPRESSION: 1. RIGHT arm PICC tip projects at or near the cavoatrial junction. 2. Remaining support apparatus satisfactory. 3. Stable bibasilar atelectasis. 4. Stable rounded opacity in the LEFT upper lobe at the site of the prior chest tube, presumably a small hematoma. 5. No new abnormalities. Electronically Signed   By: Evangeline Dakin M.D.   On: 03/14/2019 13:18   Vas US Doppler Pre Cabg  Result Date: 03/09/2019 PREOPERATIVE VASCULAR EVALUATION  Indications:      Pre CABG evaluation. Risk Factors:     Hypertension, hyperlipidemia, coronary artery disease. Other Factors:    Atrial fibrillation. Comparison Study: Prior study done 05/23/16 is available for comparison Performing Technologist: Birdena Crandall, [place] Technologist: Sharion Dove RVS  Examination Guidelines: A complete evaluation includes B-mode imaging, spectral Doppler, color Doppler, and power Doppler as needed of all accessible portions of each vessel. Bilateral testing is considered an integral part of a complete examination. Limited examinations for reoccurring indications may be performed as noted.  Right Carotid Findings: +----------+--------+--------+--------+--------+------------------+           PSV cm/sEDV cm/sStenosisDescribeComments            +----------+--------+--------+--------+--------+------------------+ CCA Prox  46      6               calcific                   +----------+--------+--------+--------+--------+------------------+ CCA Distal42      7                       intimal thickening +----------+--------+--------+--------+--------+------------------+ ICA Prox  46      14              calcificShadowing          +----------+--------+--------+--------+--------+------------------+ ICA Distal52      11                                         +----------+--------+--------+--------+--------+------------------+ ECA       108     11                                         +----------+--------+--------+--------+--------+------------------+ Portions of this  table do not appear on this page. +----------+--------+-------+--------+------------+           PSV cm/sEDV cmsDescribeArm Pressure +----------+--------+-------+--------+------------+ Subclavian161                    145          +----------+--------+-------+--------+------------+ +---------+--------+--+--------+--+ VertebralPSV cm/s34EDV cm/s11 +---------+--------+--+--------+--+ Left Carotid Findings: +----------+--------+--------+--------+------------+------------------+           PSV cm/sEDV cm/sStenosisDescribe    Comments           +----------+--------+--------+--------+------------+------------------+ CCA Prox  55      13              homogeneous                    +----------+--------+--------+--------+------------+------------------+ CCA Distal57      12                          intimal thickening +----------+--------+--------+--------+------------+------------------+ ICA Prox  66      22              heterogenous                   +----------+--------+--------+--------+------------+------------------+ ICA Distal77      21                          tortuous            +----------+--------+--------+--------+------------+------------------+ ECA       184     19                                             +----------+--------+--------+--------+------------+------------------+ +----------+--------+--------+--------+------------+ SubclavianPSV cm/sEDV cm/sDescribeArm Pressure +----------+--------+--------+--------+------------+           220                     145          +----------+--------+--------+--------+------------+ +---------+--------+--+--------+-+ VertebralPSV cm/s33EDV cm/s7 +---------+--------+--+--------+-+  ABI Findings: +---------+------------------+-----+----------+--------+ Right    Rt Pressure (mmHg)IndexWaveform  Comment  +---------+------------------+-----+----------+--------+ Brachial 145                    triphasic          +---------+------------------+-----+----------+--------+ PTA      104               0.72 monophasic         +---------+------------------+-----+----------+--------+ DP       117               0.81 monophasic         +---------+------------------+-----+----------+--------+ Great Toe76                0.52                    +---------+------------------+-----+----------+--------+ +---------+------------------+-----+---------+-------+ Left     Lt Pressure (mmHg)IndexWaveform Comment +---------+------------------+-----+---------+-------+ Brachial 145                    triphasic        +---------+------------------+-----+---------+-------+ PTA      186               1.28 biphasic         +---------+------------------+-----+---------+-------+ DP  175               1.21 biphasic         +---------+------------------+-----+---------+-------+ Great Toe118               0.81                  +---------+------------------+-----+---------+-------+ +-------+---------------+----------------+ ABI/TBIToday's ABI/TBIPrevious ABI/TBI  +-------+---------------+----------------+ Right  0.81/0.52      No previous      +-------+---------------+----------------+ Left   1.28/0.81      No previous      +-------+---------------+----------------+  Right Doppler Findings: +-----------+--------+-----+---------+--------+ Site       PressureIndexDoppler  Comments +-----------+--------+-----+---------+--------+ Brachial   145          triphasic         +-----------+--------+-----+---------+--------+ Radial                  triphasic         +-----------+--------+-----+---------+--------+ Ulnar                   triphasic         +-----------+--------+-----+---------+--------+ Palmar Arch                      normal   +-----------+--------+-----+---------+--------+  Left Doppler Findings: +-----------+--------+-----+---------+--------+ Site       PressureIndexDoppler  Comments +-----------+--------+-----+---------+--------+ Brachial   145          triphasic         +-----------+--------+-----+---------+--------+ Radial                  triphasic         +-----------+--------+-----+---------+--------+ Ulnar                   triphasic         +-----------+--------+-----+---------+--------+ Palmar Arch                      normal   +-----------+--------+-----+---------+--------+  Summary: Right Carotid: Velocities in the right ICA are consistent with a 1-39% stenosis.                No change since study done 05/23/16. Left Carotid: Velocities in the left ICA are consistent with a 1-39% stenosis.               No change since study done 05/23/16. Vertebrals:  Bilateral vertebral arteries demonstrate antegrade flow. Subclavians: Normal flow hemodynamics were seen in bilateral subclavian              arteries. Right ABI: Resting right ankle-brachial index indicates mild right lower extremity arterial disease. The right toe-brachial index is abnormal. Left ABI: Resting left ankle-brachial index is  within normal range. No evidence of significant left lower extremity arterial disease. The left toe-brachial index is normal. Right Upper Extremity: Doppler waveforms remain within normal limits with right radial compression. Doppler waveforms remain within normal limits with right ulnar compression. Left Upper Extremity: Doppler waveforms remain within normal limits with left radial compression. Doppler waveforms remain within normal limits with left ulnar compression.  Electronically signed by Servando Snare MD on 03/09/2019 at 5:45:33 PM.    Final    Vas [place] Transcranial Doppler  Result Date: 03/14/2019  Transcranial Doppler Indications: ICH. History: Post op CABG 03/10/19. Hx TIA. New onset left side weakness 03/12/19. Limitations for diagnostic windows: Unable to insonate occipital window. Comparison Study: no prior Performing Technologist: June Leap RDMS,  RVT  Examination Guidelines: A complete evaluation includes B-mode imaging, spectral Doppler, color Doppler, and power Doppler as needed of all accessible portions of each vessel. Bilateral testing is considered an integral part of a complete examination. Limited examinations for reoccurring indications may be performed as noted.  +----------+-------------+----------+-----------+--------------+ RIGHT TCD Right VM (cm)Depth (cm)Pulsatility   Comment     +----------+-------------+----------+-----------+--------------+ MCA           63.00                 1.18                   +----------+-------------+----------+-----------+--------------+ ACA          -28.00                 0.97                   +----------+-------------+----------+-----------+--------------+ Term ICA      56.00                   1                    +----------+-------------+----------+-----------+--------------+ PCA           18.00                 1.11                   +----------+-------------+----------+-----------+--------------+ Opthalmic     22.00                  1.52                   +----------+-------------+----------+-----------+--------------+ ICA siphon    38.00                 1.17                   +----------+-------------+----------+-----------+--------------+ Vertebral                                   not visualized +----------+-------------+----------+-----------+--------------+  +----------+------------+----------+-----------+--------------+ LEFT TCD  Left VM (cm)Depth (cm)Pulsatility   Comment     +----------+------------+----------+-----------+--------------+ MCA          67.00                 0.84                   +----------+------------+----------+-----------+--------------+ ACA          -24.00                1.06                   +----------+------------+----------+-----------+--------------+ Term ICA     60.00                 1.08                   +----------+------------+----------+-----------+--------------+ PCA          44.00                 1.02                   +----------+------------+----------+-----------+--------------+ Opthalmic    19.00                 1.66                   +----------+------------+----------+-----------+--------------+  ICA siphon   38.00                 0.95                   +----------+------------+----------+-----------+--------------+ Vertebral                                  not visualized +----------+------------+----------+-----------+--------------+  +------------+-------+------------------------+             VM cm/s        Comment          +------------+-------+------------------------+ Prox Basilar-14.00 Poorly visualized window +------------+-------+------------------------+ Summary:  Normalmean flow velocities in identified vessels of anterior cerebral cirn. Suboptimal occipital window limits evaluation of posterior circulation vessels. *See table(s) above for measurements and observations.  Diagnosing physician: Antony Contras  MD Electronically signed by Antony Contras MD on 03/14/2019 at 8:07:57 AM.    Final    [place] Ekg Site Rite  Result Date: 03/14/2019 If Site Rite image not attached, placement could not be confirmed due to current cardiac rhythm.   PHYSICAL EXAM   Temp:  [98 F (36.7 C)-99.9 F (37.7 C)] 98.6 F (37 C) (10/09 0700) Pulse Rate:  [72-107] 98 (10/09 1000) Resp:  [15-29] 25 (10/09 1000) BP: (89-157)/(57-114) 114/58 (10/09 1000) SpO2:  [88 %-97 %] 95 % (10/09 1000) Weight:  [128.4 kg] 128.4 kg (10/09 0500)  General - Well nourished, well developed, in no apparent distress.  Ophthalmologic - fundi not visualized due to noncooperation.  Cardiovascular - regular heart rate and rhythm today, not in afib  Mental Status -  Level of arousal and orientation to time, place, and person were intact. Language including expression, naming, repetition, comprehension was assessed and found intact. Fund of Knowledge was assessed and was intact.  Cranial Nerves II - XII - II - Visual field intact OU. III, IV, VI - Extraocular movements intact. V - Facial sensation intact bilaterally. VII - Facial movement intact bilaterally. VIII - Hearing & vestibular intact bilaterally. X - Palate elevates symmetrically. XI - Chin turning & shoulder shrug intact bilaterally. XII - Tongue protrusion intact.  Motor Strength - The patient's strength was normal in RUE and RLE, however, left UE proximal 3/5 and distal finger grip 4/5. LLE 2/5 today. Bulk was normal and fasciculations were absent.   Motor Tone - Muscle tone was assessed at the neck and appendages and was normal.  Reflexes - The patient's reflexes were symmetrical in all extremities and he had no pathological reflexes.  Sensory - Light touch, temperature/pinprick were assessed and were symmetrical. Sensory neglect resolved on LLE today.    Coordination - The patient had normal movements in right hand with no ataxia or dysmetria.  Tremor was  absent.  Gait and Station - deferred.   ASSESSMENT/PLAN Martin [name] is a 63 y.o. male with history of A. fib on Eliquis, CAD, hypertension, hyperlipidemia, OSA on CPAP admitted for unstable angina, status post CABG 10/2.  Found to have left lower extremity weakness yesterday afternoon but worsening this morning with left lower extremity plegic.  No tPA given due to outside window and recent surgery.    Acute episode of lethargy  Occurred 12pm 10/8  Sudden onset lethargy, drowsy, decreased LOC but resolved after CT head  Stat CT no bleeding  BP reported normal, no hypotension  EEG neg for seizure  Avoid low BP  Continue heparin to  eliquis transition  Wife said it was likely due to he was worn out from therapy yesterday before the episode  Stroke:  right posterior ACA distal infarct, etiology uncertain, could be A. fib while holding on AC versus intracranial atherosclerosis in the setting of low BP  Resultant left arm weakness and left leg plegic  MRI not able to perform due to recent CABG procedure  CT head showed right distal ACA infarct  CTA head and neck No ELVO. New R ACA A1 partially calcified clot. L V4 opacifiecation. B fetal origin PCAs w/ small VB system.   Repeat CT 10/6 stable small right ACA infarct  Repeat CT 10/8 stable small right ACA infarct  Carotid Doppler 10/1 bilateral ICA 1 to 39% stenosis  TEE 10/2 EF 55 to 60%, no PFO  TCD showed no proximal intracranial large vessel stenosis  LDL 85  HgbA1c 5.6  Lovenox for VTE prophylaxis  Eliquis (apixaban) daily prior to admission, now on aspirin 81 mg daily. On Eliquis  Therapy recommendations: CIR  Disposition: Pending  Hx of hypertension Hypotension  BP low at 70s-90s on 10/4 from midnight to morning, now much improved  Pt on cardizem drip, milrinone drip, metoprolol po which all have impact on low BP  BP remains on the low side at times, 90-130s  Avoid low BP  BP goal 110-140  to maintain cerebral perfusion  Now BP 130-160s  afib RVR  Cardiology on board  On amiodarone, cardizem and metoprolol  eliquis on hold for now  On Eliquis  Unstable angina / CAD s/p CABG  CABG 10/2  CTS and cardiology on board  On ASA 81 and lipitor 80  AKI   Cre 0.88->2.21->1.33 ->0.97->0.82->0.86  On IVF  Manage per primary team  Hyperlipidemia  Home meds:  lipitor   LDL 85, goal < 70  Now on lipitor 80  Continue statin at discharge  Other Stroke Risk Factors  Advanced age  Morbid Obesity, Body mass index is 43.04 kg/m.   Hx stroke/TIA  Obstructive sleep apnea, on CPAP at home  Diastolic CHF  Other Active Problems  Hyponatremia - Na 129->129->126->127 ->125 ->128   Leukocytosis WBC 12.8->10.8->10.4 ->6.7 ->7.9     Thrombocytopenia, resolved - Platelet 127->163->167 ->211 ->244    Hypokalemia 3.2->3.7 replaced  Hospital day # 10  Neurology will sign off. Please call with questions. Pt will follow up with stroke clinic NP at [facility] in about 4 weeks. Thanks for the consult.   Rosalin Hawking, MD PhD Stroke Neurology 03/17/2019 10:42 AM    To contact Stroke Continuity provider, please refer to [email or web address] After hours, contact General Neurology

## 2019-03-17 NOTE — Plan of Care (Signed)
Pt is now resting in bed with eyes closed, on CPAP, breathing is even and unlabored, no signs of discomfort or shortness of breath noted. For the most part, pt is alert and oriented and follows commands. Pt moves his right arm and leg well. Left arm is weak but is able to hold things. Pt is still not able to move his left leg. Pt has intact sensation in all extremities. Equal facial symmetry noted. Pt is able to swallow, no coughing noted when drinking water or taking medications PO. Pt has denied pain throughout the night. Some groaning and grimacing noted at the beginning of the shift so PRN medication was given for pain. See MAR. PT does have random episodes when he is hard to arouse and only opens his eyes with a sternal rub. Pt will then open his eyes and mumble, is confused, and does not follow commands. Pt can be reoriented. Pt has had a mild cough and has been proactive with using his incentive spirometer. Pt's heart rate is more controlled, in the 80s, but still AFIB. Call bell is within reach on his right side, bed alarm is on, and bed is in lowest position. Problem: Clinical Measurements: Goal: Ability to maintain clinical measurements within normal limits will improve Outcome: Progressing Goal: Cardiovascular complication will be avoided Outcome: Progressing   Problem: Activity: Goal: Risk for activity intolerance will decrease Outcome: Progressing   Problem: Pain Managment: Goal: General experience of comfort will improve Outcome: Progressing   Problem: Safety: Goal: Ability to remain free from injury will improve Outcome: Progressing   Problem: Skin Integrity: Goal: Risk for impaired skin integrity will decrease Outcome: Progressing

## 2019-03-17 NOTE — Progress Notes (Signed)
7 Days Post-Op Procedure(s) (LRB): CORONARY ARTERY BYPASS GRAFTING (CABG)X 3 Using Left Internal Mammary Artery and Right Saphenous Vein Grafts (N/A) TRANSESOPHAGEAL ECHOCARDIOGRAM (TEE) (N/A) Subjective: 1 week after urgent multivessel CABG Preoperative history of paroxysmal atrial fibrillation, hypertension, obesity, COPD, and carotid artery disease  Patient had a right subcortical CVA postop day 3 with weakness of the left leg greater than weakness of the left arm.  CTA shows small vessel disease. Patient transition from heparin to apixaban. Patient has been in atrial fibrillation postoperatively rate controlled on oral Cardizem and amiodarone.  Patient was up to the chair today with physical therapy. Overall strength and mobility significant limited by left-sided weakness Patient being evaluated for CIR next week  Postoperative rising creatinine has normalized, receiving IV Lasix for fluid overload Postop ileus has resolved and diet advanced  Objective: Vital signs in last 24 hours: Temp:  [98 F (36.7 C)-99.6 F (37.6 C)] 99.6 F (37.6 C) (10/09 1606) Pulse Rate:  [72-107] 91 (10/09 1700) Cardiac Rhythm: Atrial fibrillation (10/09 0800) Resp:  [15-29] 22 (10/09 1700) BP: (89-166)/(57-136) 166/111 (10/09 1700) SpO2:  [89 %-97 %] 93 % (10/09 1700) Weight:  [128.4 kg] 128.4 kg (10/09 0500)  Hemodynamic parameters for last 24 hours:  Blood pressure stable, afebrile, rate controlled atrial fibrillation  Intake/Output from previous day: 10/08 0701 - 10/09 0700 In: 998.1 [P.O.:240; I.V.:611; IV Piggyback:147.1] Out: 3675 [Urine:3675] Intake/Output this shift: Total I/O In: 1407.8 [P.O.:1200; I.V.:107.8; IV Piggyback:100] Out: 1810 [Urine:1810]  Exam  Obese male no distress Coarse breath sounds Abdomen mildly distended soft Left arm mild-moderate weakness, left leg moderate-severe weakness  Lab Results: Recent Labs    03/16/19 0431 03/16/19 1239 03/17/19 0408  WBC  6.7  --  7.9  HGB 11.2* 12.2* 11.0*  HCT 32.5* 36.0* 32.0*  PLT 211  --  244   BMET:  Recent Labs    03/16/19 0431 03/16/19 1239 03/17/19 0408  NA 127* 125* 128*  K 3.2* 3.9 3.7  CL 90*  --  90*  CO2 24  --  25  GLUCOSE 154*  --  144*  BUN 25*  --  21  CREATININE 0.82  --  0.86  CALCIUM 8.1*  --  8.2*    PT/INR: No results for input(s): LABPROT, INR in the last 72 hours. ABG    Component Value Date/Time   PHART 7.478 (H) 03/16/2019 1239   HCO3 24.2 03/16/2019 1239   TCO2 25 03/16/2019 1239   ACIDBASEDEF 3.0 (H) 03/10/2019 1833   O2SAT 63.0 03/17/2019 0408   CBG (last 3)  Recent Labs    03/17/19 0707 03/17/19 1213 03/17/19 1602  GLUCAP 127* 207* 115*    Assessment/Plan: S/P Procedure(s) (LRB): CORONARY ARTERY BYPASS GRAFTING (CABG)X 3 Using Left Internal Mammary Artery and Right Saphenous Vein Grafts (N/A) TRANSESOPHAGEAL ECHOCARDIOGRAM (TEE) (N/A) Continue therapy for his atrial fibrillation, left-sided stroke, COPD and hypertension Goal is for transition to CIR care next week  LOS: 10 days    Martin [name] 03/17/2019

## 2019-03-17 NOTE — Progress Notes (Signed)
TCTS BRIEF SICU PROGRESS NOTE  7 Days Post-Op  S/P Procedure(s) (LRB): CORONARY ARTERY BYPASS GRAFTING (CABG)X 3 Using Left Internal Mammary Artery and Right Saphenous Vein Grafts (N/A) TRANSESOPHAGEAL ECHOCARDIOGRAM (TEE) (N/A)   Stable day  Plan: Continue current plan  Rexene Alberts, MD 03/17/2019 7:41 PM

## 2019-03-17 NOTE — Progress Notes (Signed)
  Speech Language Pathology Treatment: Cognitive-Linquistic  Patient Details Name: Martin [name] MRN: [id number] DOB: 1955-08-20 Today's Date: 03/17/2019 Time: [id number] SLP Time Calculation (min) (ACUTE ONLY): 11 min  Assessment / Plan / Recommendation Clinical Impression  Pt's diet has been advanced from full liquids to regular - tolerates well per observation with no s/s of dysphagia.  No further SLP f/u for swallowing.  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 Mod I with basic hypothetical problem solving for functional issues related to daily care.  Pt is a candidate for CIR - may benefit from more thorough cognitive-linguistic assessment in that venue of care given work demands.  Pt/wife agree.   HPI HPI: Pt is a 63 y/o male with PMH of  CAD, paroxysmal atrial fibrillation, OA, low back pain, polysubstance abuse, presents with chest pain and possible NSTEMI. S/p L heart cath and coronary angiography 9/30, CABG x 3 03/10/19.  Course complicated by CT revealing R distal ACA infarct 03/13/19. New CXR 10/8 afer full liquid diet initiated: "Evidence of bilateral pleural effusions with retrocardiac and left perihilar atelectasis."      SLP Plan  Continue with current plan of care for cognition; no further f/u for swallowing.       Recommendations  Diet recommendations: Regular;Thin liquid Liquids provided via: Cup;Straw                Oral Care Recommendations: Oral care BID Follow up Recommendations: Inpatient Rehab Plan: Continue with current plan of care       GO               Martin Holm L. Tivis Ringer, [place] CCC/SLP Acute Rehabilitation Services Office number [phone] Pager [phone]  Juan Quam Laurice 03/17/2019, 3:26 PM

## 2019-03-17 NOTE — TOC Initial Note (Signed)
Transition of Care [facility]) - Initial/Assessment Note    Patient Details  Name: Martin [name] MRN: [id number] Date of Birth: Mar 20, 1956  Transition of [facility]) CM/SW Contact:    Midge Minium RN, BSN, NCM-BC, ACM-RN [phone] Phone Number: 03/17/2019, 11:09 AM  Clinical Narrative:                 CM following for dispositional needs. Pt is a 63 y/o male with PMH of  CAD, paroxysmal atrial fibrillation, OA, low back pain, polysubstance abuse, presents with chest pain and possible NSTEMI. S/p L heart cath and coronary angiography 9/30, CABG x 3 03/10/19. PT/OT eval completed with CIR recommended; Rehab AC following for appropriateness. CM requested an IP Rehab order during interdisciplinary rounds on 10/8. CM spoke to the patients spouse, Martin [name], who's agreeable to CIR (pending insurance approval); spouse states she will be available to provide any assistance post-discharge. CM team will continue to follow.   Expected Discharge Plan: IP Rehab Facility Barriers to Discharge: Continued Medical Work up   Patient Goals and CMS Choice   CMS Medicare.gov Compare Post Acute Care list provided to:: Patient Represenative (must comment) Choice offered to / list presented to : Spouse  Expected Discharge Plan and Services Expected Discharge Plan: [place] In-house Referral: NA Discharge Planning Services: CM Consult Post Acute Care Choice: IP Rehab Living arrangements for the past 2 months: Single Family Home                 DME Arranged: N/A DME Agency: NA       HH Arranged: NA HH Agency: NA        Prior Living Arrangements/Services Living arrangements for the past 2 months: Single Family Home Lives with:: Self, Spouse Patient language and need for interpreter reviewed:: Yes Do you feel safe going back to the place where you live?: Yes      Need for Family Participation in Patient Care: Yes (Comment) Care giver support system in place?: Yes (comment)    Criminal Activity/Legal Involvement Pertinent to Current Situation/Hospitalization: No - Comment as needed  Activities of Daily Living Home Assistive Devices/Equipment: None ADL Screening (condition at time of admission) Patient's cognitive ability adequate to safely complete daily activities?: Yes Is the patient deaf or have difficulty hearing?: No Does the patient have difficulty seeing, even when wearing glasses/contacts?: No Does the patient have difficulty concentrating, remembering, or making decisions?: No Patient able to express need for assistance with ADLs?: Yes Does the patient have difficulty dressing or bathing?: No Independently performs ADLs?: Yes (appropriate for developmental age) Does the patient have difficulty walking or climbing stairs?: No Weakness of Legs: None Weakness of Arms/Hands: None  Permission Sought/Granted Permission sought to share information with : Case Manager, Chartered certified accountant granted to share information with : Yes, Verbal Permission Granted              Emotional Assessment Appearance:: Appears stated age     Orientation: : Oriented to Self, Oriented to Place, Oriented to  Time, Oriented to Situation Alcohol / Substance Use: Not Applicable Psych Involvement: No (comment)  Admission diagnosis:  NSTEMI (non-ST elevated myocardial infarction) ([place]) [I21.4] S/P CABG x 3 [Z95.1] Patient Active Problem List   Diagnosis Date Noted  . Lethargy   . Persistent atrial fibrillation ([place])   . Cerebral embolism with cerebral infarction 03/13/2019  . S/P CABG x 3 03/10/2019  . NSTEMI (non-ST elevated myocardial infarction) ([place]) 03/07/2019  . Unstable angina (  [place]) 03/07/2019  . OSA on CPAP 05/27/2016  . Obesity 06/10/2011  . Insomnia 06/10/2011  . PVC's (premature ventricular contractions)   . CAD (coronary artery disease)   . Carotid artery disease ([place])   . DEGENERATIVE JOINT DISEASE 07/28/2010  . Dyslipidemia  06/17/2010  . SUBSTANCE ABUSE, MULTIPLE 06/17/2010  . Essential hypertension 06/17/2010  . CAD 06/17/2010  . BRADYCARDIA 06/17/2010   PCP:  [name], MD Pharmacy:   [id number] - [place], [place] [address] [place] [address] Phone: [phone] Fax: [phone]     Social Determinants of Health (SDOH) Interventions    Readmission Risk Interventions No flowsheet data found.

## 2019-03-17 NOTE — Progress Notes (Signed)
Physical Therapy Treatment Patient Details Name: Martin [name] MRN: [id number] DOB: January 16, 1956 Today's Date: 03/17/2019    History of Present Illness Pt is a 63 y/o male with PMH of  CAD, paroxysmal atrial fibrillation, OA, low back pain, polysubstance abuse, presents with chest pain and possible NSTEMI. S/p L heart cath and coronary angiography 9/30, CABG x 3 03/10/19.  Course complicated by CT revealing R distal ACA infarct 03/13/19. Pt with periods of lethargy; EEG WNL.   PT Comments    Pt progressing well with mobility; highly motivated to get out of bed and regain strength in order to return home. Initiated standing trials with [place], pt moving well and able to progress to standing without lift equipment and modA+2. Pt with difficulty maintaining sternal precautions with mobility. Improved LUE strength/ROM; no active movement noted in LLE. Continue to recommend intensive CIR-level therapies to maximize functional mobility and independence.   Follow Up Recommendations  CIR;Supervision/Assistance - 24 hour     Equipment Recommendations  (TBD next venue)    Recommendations for Other Services       Precautions / Restrictions Precautions Precautions: Fall;Sternal    Mobility  Bed Mobility Overal bed mobility: Needs Assistance Bed Mobility: Supine to Sit     Supine to sit: Max assist;HOB elevated     General bed mobility comments: MaxA for LLE management, pt assisting well with R-side and LUE as able, assist for trunk elevation and scooting L hip to EOB  Transfers Overall transfer level: Needs assistance Equipment used: Ambulation equipment used;1 person hand held assist Transfers: Sit to/from Stand Sit to Stand: Min assist;Mod assist;+2 physical assistance         General transfer comment: Able to stand to [place] with minA+2, pt cued to decrease pulling through BUEs and allow PT/tech to assist trunk elevation more. Once in recliner, performed 2x sit<>stand with modA+2,  hands on knees reaching for UE support to balance, L knee blocked due to buckling. No active movement noted in LLE with attempted weight shifts  Ambulation/Gait                 Stairs             Wheelchair Mobility    Modified Rankin (Stroke Patients Only)       Balance Overall balance assessment: Needs assistance Sitting-balance support: Feet supported Sitting balance-Leahy Scale: Fair Sitting balance - Comments: increased L lateral bias with fatigue     Standing balance-Leahy Scale: Poor Standing balance comment: Reliant on UE support and external assist                            Cognition Arousal/Alertness: Awake/alert Behavior During Therapy: WFL for tasks assessed/performed Overall Cognitive Status: Within Functional Limits for tasks assessed                                        Exercises      General Comments General comments (skin integrity, edema, etc.): SpO2 >94% on RA throughout session (RN notified and [place] left off). Post-mobility BP 131/92, HR 95      Pertinent Vitals/Pain Pain Assessment: Faces Faces Pain Scale: Hurts a little bit Pain Location: Sternum Pain Descriptors / Indicators: Discomfort;Guarding Pain Intervention(s): Monitored during session    Home Living  Prior Function            PT Goals (current goals can now be found in the care plan section) Acute Rehab PT Goals Patient Stated Goal: "Get home to my grandkids" Progress towards PT goals: Progressing toward goals    Frequency    Min 4X/week      PT Plan Current plan remains appropriate    Co-evaluation              AM-PAC PT "6 Clicks" Mobility   Outcome Measure  Help needed turning from your back to your side while in a flat bed without using bedrails?: A Lot Help needed moving from lying on your back to sitting on the side of a flat bed without using bedrails?: A Lot Help needed moving to  and from a bed to a chair (including a wheelchair)?: A Lot Help needed standing up from a chair using your arms (e.g., wheelchair or bedside chair)?: A Lot Help needed to walk in hospital room?: A Lot Help needed climbing 3-5 steps with a railing? : Total 6 Click Score: 11    End of Session Equipment Utilized During Treatment: Gait belt Activity Tolerance: Patient tolerated treatment well Patient left: in chair;with call bell/phone within reach;with chair alarm set Nurse Communication: Mobility status;Need for lift equipment PT Visit Diagnosis: Muscle weakness (generalized) (M62.81);Hemiplegia and hemiparesis;Other abnormalities of gait and mobility (R26.89) Hemiplegia - Right/Left: Left Hemiplegia - dominant/non-dominant: Non-dominant Hemiplegia - caused by: Cerebral infarction     Time: 1030-1054 PT Time Calculation (min) (ACUTE ONLY): 24 min  Charges:  $Therapeutic Activity: 23-37 mins                    Mabeline Caras, PT, DPT Acute Rehabilitation Services  Pager [phone] Office [place] 03/17/2019, 1:39 PM

## 2019-03-17 NOTE — Discharge Instructions (Addendum)
Discharge Instructions:  1. You may shower, please wash incisions daily with soap and water and keep dry.  If you wish to cover wounds with dressing you may do so but please keep clean and change daily.  No tub baths or swimming until incisions have completely healed.  If your incisions become red or develop any drainage please call our office at [phone]  2. No Driving until cleared by Dr. Lucianne Lei Trigt's office and you are no longer using narcotic pain medications  3. Monitor your weight daily.. Please use the same scale and weigh at same time... If you gain 5-10 lbs in 48 hours with associated lower extremity swelling, please contact our office at [phone]  4. Fever of 101.5 for at least 24 hours with no source, please contact our office at [phone]  5. Activity- up as tolerated, please walk at least 3 times per day.  Avoid strenuous activity, no lifting, pushing, or pulling with your arms over 8-10 lbs for a minimum of 6 weeks  6. If any questions or concerns arise, please do not hesitate to contact our office at [phone]. Information on my medicine - ELIQUIS (apixaban)   Prediabetes Eating Plan Prediabetes is a condition that causes blood sugar (glucose) levels to be higher than normal. This increases the risk for developing diabetes. In order to prevent diabetes from developing, your health care provider may recommend a diet and other lifestyle changes to help you:  Control your blood glucose levels.  Improve your cholesterol levels.  Manage your blood pressure. Your health care provider may recommend working with a diet and nutrition specialist (dietitian) to make a meal plan that is best for you. What are tips for following this plan? Lifestyle  Set weight loss goals with the help of your health care team. It is recommended that most people with prediabetes lose 7% of their current body weight.  Exercise for at least 30 minutes at least 5 days a week.  Attend a  support group or seek ongoing support from a mental health counselor.  Take over-the-counter and prescription medicines only as told by your health care provider. Reading food labels  Read food labels to check the amount of fat, salt (sodium), and sugar in prepackaged foods. Avoid foods that have: ? Saturated fats. ? Trans fats. ? Added sugars.  Avoid foods that have more than 300 milligrams (mg) of sodium per serving. Limit your daily sodium intake to less than 2,300 mg each day. Shopping  Avoid buying pre-made and processed foods. Cooking  Cook with olive oil. Do not use butter, lard, or ghee.  Bake, broil, grill, or boil foods. Avoid frying. Meal planning   Work with your dietitian to develop an eating plan that is right for you. This may include: ? Tracking how many calories you take in. Use a food diary, notebook, or mobile application to track what you eat at each meal. ? Using the glycemic index (GI) to plan your meals. The index tells you how quickly a food will raise your blood glucose. Choose low-GI foods. These foods take a longer time to raise blood glucose.  Consider following a Mediterranean diet. This diet includes: ? Several servings each day of fresh fruits and vegetables. ? Eating fish at least twice a week. ? Several servings each day of whole grains, beans, nuts, and seeds. ? Using olive oil instead of other fats. ? Moderate alcohol consumption. ? Eating small amounts of red meat and whole-fat dairy.  If  you have high blood pressure, you may need to limit your sodium intake or follow a diet such as the DASH eating plan. DASH is an eating plan that aims to lower high blood pressure. What foods are recommended? The items listed below may not be a complete list. Talk with your dietitian about what dietary choices are best for you. Grains Whole grains, such as whole-wheat or whole-grain breads, crackers, cereals, and pasta. Unsweetened oatmeal. Bulgur. Barley.  Quinoa. Brown rice. Corn or whole-wheat flour tortillas or taco shells. Vegetables Lettuce. Spinach. Peas. Beets. Cauliflower. Cabbage. Broccoli. Carrots. Tomatoes. Squash. Eggplant. Herbs. Peppers. Onions. Cucumbers. Brussels sprouts. Fruits Berries. Bananas. Apples. Oranges. Grapes. Papaya. Mango. Pomegranate. Kiwi. Grapefruit. Cherries. Meats and other protein foods Seafood. Poultry without skin. Lean cuts of pork and beef. Tofu. Eggs. Nuts. Beans. Dairy Low-fat or fat-free dairy products, such as yogurt, cottage cheese, and cheese. Beverages Water. Tea. Coffee. Sugar-free or diet soda. Seltzer water. Lowfat or no-fat milk. Milk alternatives, such as soy or almond milk. Fats and oils Olive oil. Canola oil. Sunflower oil. Grapeseed oil. Avocado. Walnuts. Sweets and desserts Sugar-free or low-fat pudding. Sugar-free or low-fat ice cream and other frozen treats. Seasoning and other foods Herbs. Sodium-free spices. Mustard. Relish. Low-fat, low-sugar ketchup. Low-fat, low-sugar barbecue sauce. Low-fat or fat-free mayonnaise. What foods are not recommended? The items listed below may not be a complete list. Talk with your dietitian about what dietary choices are best for you. Grains Refined white flour and flour products, such as bread, pasta, snack foods, and cereals. Vegetables Canned vegetables. Frozen vegetables with butter or cream sauce. Fruits Fruits canned with syrup. Meats and other protein foods Fatty cuts of meat. Poultry with skin. Breaded or fried meat. Processed meats. Dairy Full-fat yogurt, cheese, or milk. Beverages Sweetened drinks, such as sweet iced tea and soda. Fats and oils Butter. Lard. Ghee. Sweets and desserts Baked goods, such as cake, cupcakes, pastries, cookies, and cheesecake. Seasoning and other foods Spice mixes with added salt. Ketchup. Barbecue sauce. Mayonnaise. Summary  To prevent diabetes from developing, you may need to make diet and other  lifestyle changes to help control blood sugar, improve cholesterol levels, and manage your blood pressure.  Set weight loss goals with the help of your health care team. It is recommended that most people with prediabetes lose 7 percent of their current body weight.  Consider following a Mediterranean diet that includes plenty of fresh fruits and vegetables, whole grains, beans, nuts, seeds, fish, lean meat, low-fat dairy, and healthy oils. This information is not intended to replace advice given to you by your health care provider. Make sure you discuss any questions you have with your health care provider. Document Released: 10/09/2014 Document Revised: 09/16/2018 Document Reviewed: 07/29/2016 Elsevier Patient Education  2020 [employer].   Why was Eliquis prescribed for you? Eliquis was prescribed for you to reduce the risk of a blood clot forming that can cause a stroke if you have a medical condition called atrial fibrillation (a type of irregular heartbeat).  What do You need to know about Eliquis ? Take your Eliquis TWICE DAILY - one tablet in the morning and one tablet in the evening with or without food. If you have difficulty swallowing the tablet whole please discuss with your pharmacist how to take the medication safely.  Take Eliquis exactly as prescribed by your doctor and DO NOT stop taking Eliquis without talking to the doctor who prescribed the medication.  Stopping may increase your risk of  developing a stroke.  Refill your prescription before you run out.  After discharge, you should have regular check-up appointments with your healthcare provider that is prescribing your Eliquis.  In the future your dose may need to be changed if your kidney function or weight changes by a significant amount or as you get older.  What do you do if you miss a dose? If you miss a dose, take it as soon as you remember on the same day and resume taking twice daily.  Do not take more than  one dose of ELIQUIS at the same time to make up a missed dose.  Important Safety Information A possible side effect of Eliquis is bleeding. You should call your healthcare provider right away if you experience any of the following: ? Bleeding from an injury or your nose that does not stop. ? Unusual colored urine (red or dark brown) or unusual colored stools (red or black). ? Unusual bruising for unknown reasons. ? A serious fall or if you hit your head (even if there is no bleeding).  Some medicines may interact with Eliquis and might increase your risk of bleeding or clotting while on Eliquis. To help avoid this, consult your healthcare provider or pharmacist prior to using any new prescription or non-prescription medications, including herbals, vitamins, non-steroidal anti-inflammatory drugs (NSAIDs) and supplements.  This website has more information on Eliquis (apixaban): http://www.eliquis.com/eliquis/home

## 2019-03-18 ENCOUNTER — Inpatient Hospital Stay (HOSPITAL_COMMUNITY): Payer: Medicare Other

## 2019-03-18 LAB — COMPREHENSIVE METABOLIC PANEL
ALT: 79 U/L — ABNORMAL HIGH (ref 0–44)
AST: 95 U/L — ABNORMAL HIGH (ref 15–41)
Albumin: 2.2 g/dL — ABNORMAL LOW (ref 3.5–5.0)
Alkaline Phosphatase: 130 U/L — ABNORMAL HIGH (ref 38–126)
Anion gap: 10 (ref 5–15)
BUN: 18 mg/dL (ref 8–23)
CO2: 25 mmol/L (ref 22–32)
Calcium: 7.9 mg/dL — ABNORMAL LOW (ref 8.9–10.3)
Chloride: 96 mmol/L — ABNORMAL LOW (ref 98–111)
Creatinine, Ser: 0.71 mg/dL (ref 0.61–1.24)
GFR calc Af Amer: >60 mL/min (ref >60)
GFR calc non Af Amer: >60 mL/min (ref >60)
Glucose, Bld: 108 mg/dL — ABNORMAL HIGH (ref 70–99)
Potassium: 3.5 mmol/L (ref 3.5–5.1)
Sodium: 131 mmol/L — ABNORMAL LOW (ref 135–145)
Total Bilirubin: 1.1 mg/dL (ref 0.3–1.2)
Total Protein: 5.2 g/dL — ABNORMAL LOW (ref 6.5–8.1)

## 2019-03-18 LAB — COOXEMETRY PANEL
Carboxyhemoglobin: 1.3 % (ref 0.5–1.5)
Methemoglobin: 0.6 % (ref 0.0–1.5)
O2 Saturation: 62.5 %
Total hemoglobin: 11.8 g/dL — ABNORMAL LOW (ref 12.0–16.0)

## 2019-03-18 LAB — GLUCOSE, CAPILLARY
Glucose-Capillary: 109 mg/dL — ABNORMAL HIGH (ref 70–99)
Glucose-Capillary: 108 mg/dL — ABNORMAL HIGH (ref 70–99)
Glucose-Capillary: 158 mg/dL — ABNORMAL HIGH (ref 70–99)
Glucose-Capillary: 132 mg/dL — ABNORMAL HIGH (ref 70–99)

## 2019-03-18 LAB — CBC
HCT: 33.7 % — ABNORMAL LOW (ref 39.0–52.0)
Hemoglobin: 11.6 g/dL — ABNORMAL LOW (ref 13.0–17.0)
MCH: 33.3 pg (ref 26.0–34.0)
MCHC: 34.4 g/dL (ref 30.0–36.0)
MCV: 96.8 fL (ref 80.0–100.0)
Platelets: 260 10*3/uL (ref 150–400)
RBC: 3.48 MIL/uL — ABNORMAL LOW (ref 4.22–5.81)
RDW: 13.7 % (ref 11.5–15.5)
WBC: 11.7 10*3/uL — ABNORMAL HIGH (ref 4.0–10.5)
nRBC: 0.2 % (ref 0.0–0.2)

## 2019-03-18 MED ORDER — POTASSIUM CHLORIDE CRYS ER 20 MEQ PO TBCR
20.0000 meq | EXTENDED_RELEASE_TABLET | ORAL | Status: AC
  Administered 2019-03-18 (×3): 20 meq via ORAL
  Filled 2019-03-18 (×3): qty 1

## 2019-03-18 NOTE — Progress Notes (Signed)
TCTS BRIEF SICU PROGRESS NOTE  8 Days Post-Op  S/P Procedure(s) (LRB): CORONARY ARTERY BYPASS GRAFTING (CABG)X 3 Using Left Internal Mammary Artery and Right Saphenous Vein Grafts (N/A) TRANSESOPHAGEAL ECHOCARDIOGRAM (TEE) (N/A)   Stable day Maintaining rate-controlled AF w/ stable BP Breathing comfortably w/ O2 sats 93% UOP adequate  Plan: Continue current plan  Rexene Alberts, MD 03/18/2019 6:02 PM

## 2019-03-18 NOTE — Progress Notes (Signed)
      [address]       [place] [address]             [phone]        CARDIOTHORACIC SURGERY PROGRESS NOTE   R8 Days Post-Op Procedure(s) (LRB): CORONARY ARTERY BYPASS GRAFTING (CABG)X 3 Using Left Internal Mammary Artery and Right Saphenous Vein Grafts (N/A) TRANSESOPHAGEAL ECHOCARDIOGRAM (TEE) (N/A)  Subjective: No specific complaints  Objective: Vital signs: BP Readings from Last 1 Encounters:  03/18/19 (!) 151/89   Pulse Readings from Last 1 Encounters:  03/18/19 80   Resp Readings from Last 1 Encounters:  03/18/19 19   Temp Readings from Last 1 Encounters:  03/18/19 98.5 F (36.9 C) (Oral)    Hemodynamics:    Physical Exam:  Rhythm:   Afib w/ controlled rate  Breath sounds: Diminished at bases  Heart sounds:  irregular  Incisions:  Clean and dry  Abdomen:  Soft, non-distended, non-tender  Extremities:  Warm, well-perfused    Intake/Output from previous day: 10/09 0701 - 10/10 0700 In: 1407.8 [P.O.:1200; I.V.:107.8; IV Piggyback:100] Out: 2925 [Urine:2925] Intake/Output this shift: Total I/O In: 240 [P.O.:240] Out: 185 [Urine:185]  Lab Results:  CBC: Recent Labs    03/17/19 0408 03/18/19 0426  WBC 7.9 11.7*  HGB 11.0* 11.6*  HCT 32.0* 33.7*  PLT 244 260    BMET:  Recent Labs    03/17/19 0408 03/18/19 0426  NA 128* 131*  K 3.7 3.5  CL 90* 96*  CO2 25 25  GLUCOSE 144* 108*  BUN 21 18  CREATININE 0.86 0.71  CALCIUM 8.2* 7.9*     PT/INR:  No results for input(s): LABPROT, INR in the last 72 hours.  CBG (last 3)  Recent Labs    03/17/19 1602 03/17/19 2130 03/18/19 0650  GLUCAP 115* 123* 109*    ABG    Component Value Date/Time   PHART 7.478 (H) 03/16/2019 1239   PCO2ART 32.7 03/16/2019 1239   PO2ART 61.0 (L) 03/16/2019 1239   HCO3 24.2 03/16/2019 1239   TCO2 25 03/16/2019 1239   ACIDBASEDEF 3.0 (H) 03/10/2019 1833   O2SAT 62.5 03/18/2019 0430    CXR: PORTABLE CHEST 1 VIEW  COMPARISON:   March 16, 2019.  FINDINGS: Stable cardiomegaly. Status post coronary bypass graft. Right-sided PICC line is unchanged in position. Stable left perihilar density is noted concerning for atelectasis. No pneumothorax is noted. Stable bilateral pleural effusions are noted with associated atelectasis. Bony thorax is unremarkable.  IMPRESSION: Stable bilateral pleural effusions with associated atelectasis.   Electronically Signed   By: Marijo Conception M.D.   On: 03/18/2019 09:58  Assessment/Plan: S/P Procedure(s) (LRB): CORONARY ARTERY BYPASS GRAFTING (CABG)X 3 Using Left Internal Mammary Artery and Right Saphenous Vein Grafts (N/A) TRANSESOPHAGEAL ECHOCARDIOGRAM (TEE) (N/A)  Overall stable now POD8 Remains in rate-controlled Afib w/ stable BP Breathing comfortably w/ O2 sats  Persistent left-sided hemiparesis Hypertension Obesity OSA   Mobilize  PT/OT  Continue diuresis  Continue amiodarone, ASA, Eliquis, Cardizem  Rexene Alberts, MD 03/18/2019 10:48 AM

## 2019-03-19 LAB — CBC
HCT: 34.2 % — ABNORMAL LOW (ref 39.0–52.0)
Hemoglobin: 11.7 g/dL — ABNORMAL LOW (ref 13.0–17.0)
MCH: 33.4 pg (ref 26.0–34.0)
MCHC: 34.2 g/dL (ref 30.0–36.0)
MCV: 97.7 fL (ref 80.0–100.0)
Platelets: 282 10*3/uL (ref 150–400)
RBC: 3.5 MIL/uL — ABNORMAL LOW (ref 4.22–5.81)
RDW: 13.6 % (ref 11.5–15.5)
WBC: 13.9 10*3/uL — ABNORMAL HIGH (ref 4.0–10.5)
nRBC: 0.1 % (ref 0.0–0.2)

## 2019-03-19 LAB — URINALYSIS, COMPLETE (UACMP) WITH MICROSCOPIC
Bilirubin Urine: NEGATIVE
Glucose, UA: NEGATIVE mg/dL
Ketones, ur: NEGATIVE mg/dL
Leukocytes,Ua: NEGATIVE
Nitrite: NEGATIVE
Protein, ur: NEGATIVE mg/dL
Specific Gravity, Urine: 1.012 (ref 1.005–1.030)
pH: 6 (ref 5.0–8.0)

## 2019-03-19 LAB — GLUCOSE, CAPILLARY
Glucose-Capillary: 100 mg/dL — ABNORMAL HIGH (ref 70–99)
Glucose-Capillary: 136 mg/dL — ABNORMAL HIGH (ref 70–99)
Glucose-Capillary: 103 mg/dL — ABNORMAL HIGH (ref 70–99)
Glucose-Capillary: 119 mg/dL — ABNORMAL HIGH (ref 70–99)

## 2019-03-19 MED ORDER — VITAMIN B-1 100 MG PO TABS
100.0000 mg | ORAL_TABLET | Freq: Every day | ORAL | Status: DC
  Administered 2019-03-20 – 2019-03-27 (×8): 100 mg via ORAL
  Filled 2019-03-19 (×8): qty 1

## 2019-03-19 NOTE — Plan of Care (Signed)
Spoke to MD Ricard Dillon regarding pts bladder scan volume of 419. MD Ricard Dillon would like the pt to void within 12hrs of removal and it is okay to give lasix. Foley cath removed at 1030, pt was to previously void by 1630;  pt is to now void at 2230. Will continue to monitor and assess.

## 2019-03-19 NOTE — Progress Notes (Addendum)
      [address]       [place] [address]             [phone]        CARDIOTHORACIC SURGERY PROGRESS NOTE   R9 Days Post-Op Procedure(s) (LRB): CORONARY ARTERY BYPASS GRAFTING (CABG)X 3 Using Left Internal Mammary Artery and Right Saphenous Vein Grafts (N/A) TRANSESOPHAGEAL ECHOCARDIOGRAM (TEE) (N/A)  Subjective: No complaints  Objective: Vital signs: BP Readings from Last 1 Encounters:  03/19/19 (!) 106/44   Pulse Readings from Last 1 Encounters:  03/19/19 82   Resp Readings from Last 1 Encounters:  03/19/19 16   Temp Readings from Last 1 Encounters:  03/19/19 99.4 F (37.4 C) (Oral)    Hemodynamics:    Physical Exam:  Rhythm:   Afib w/ controlled HR  Breath sounds: clear  Heart sounds:  irregular  Incisions:  Clean and dry  Abdomen:  Soft, non-distended, non-tender  Extremities:  Warm, well-perfused    Intake/Output from previous day: 10/10 0701 - 10/11 0700 In: 540 [P.O.:540] Out: 3535 [Urine:3535] Intake/Output this shift: No intake/output data recorded.  Lab Results:  CBC: Recent Labs    03/18/19 0426 03/19/19 0429  WBC 11.7* 13.9*  HGB 11.6* 11.7*  HCT 33.7* 34.2*  PLT 260 282    BMET:  Recent Labs    03/17/19 0408 03/18/19 0426  NA 128* 131*  K 3.7 3.5  CL 90* 96*  CO2 25 25  GLUCOSE 144* 108*  BUN 21 18  CREATININE 0.86 0.71  CALCIUM 8.2* 7.9*     PT/INR:  No results for input(s): LABPROT, INR in the last 72 hours.  CBG (last 3)  Recent Labs    03/18/19 1533 03/18/19 2102 03/19/19 0643  GLUCAP 158* 132* 100*    ABG    Component Value Date/Time   PHART 7.478 (H) 03/16/2019 1239   PCO2ART 32.7 03/16/2019 1239   PO2ART 61.0 (L) 03/16/2019 1239   HCO3 24.2 03/16/2019 1239   TCO2 25 03/16/2019 1239   ACIDBASEDEF 3.0 (H) 03/10/2019 1833   O2SAT 62.5 03/18/2019 0430    CXR: n/a  Assessment/Plan: S/P Procedure(s) (LRB): CORONARY ARTERY BYPASS GRAFTING (CABG)X 3 Using Left Internal Mammary  Artery and Right Saphenous Vein Grafts (N/A) TRANSESOPHAGEAL ECHOCARDIOGRAM (TEE) (N/A)  Overall stable now POD9 Remains in rate-controlled Afib w/ stable BP Breathing comfortably w/ O2 sats 89-92% on room air Persistent left-sided hemiparesis Hypertension Obesity OSA Mild leukocytosis w/out fever - still has Foley catheter in place Type II diabetes mellitus, good glycemic control   D/C Foley and check urine culture  Recheck WBC and CXR tomorrow  Mobilize  PT/OT - not coming on a daily basis  Continue diuresis  Continue amiodarone, ASA, Eliquis, Cardizem  Rexene Alberts, MD 03/19/2019 9:40 AM

## 2019-03-19 NOTE — Progress Notes (Signed)
[facility] BRIEF SICU PROGRESS NOTE  9 Days Post-Op  S/P Procedure(s) (LRB): CORONARY ARTERY BYPASS GRAFTING (CABG)X 3 Using Left Internal Mammary Artery and Right Saphenous Vein Grafts (N/A) TRANSESOPHAGEAL ECHOCARDIOGRAM (TEE) (N/A)   Stable day AF w/ stable BP Breathing comfortably  No UOP since Foley removed but no urge to void yet  Plan: Continue current plan and wait before considering I/O cath  Rexene Alberts, MD 03/19/2019 7:19 PM

## 2019-03-20 ENCOUNTER — Inpatient Hospital Stay (HOSPITAL_COMMUNITY): Payer: Medicare Other

## 2019-03-20 LAB — URINE CULTURE: Culture: NO GROWTH

## 2019-03-20 LAB — COMPREHENSIVE METABOLIC PANEL
ALT: 82 U/L — ABNORMAL HIGH (ref 0–44)
AST: 72 U/L — ABNORMAL HIGH (ref 15–41)
Albumin: 2.5 g/dL — ABNORMAL LOW (ref 3.5–5.0)
Alkaline Phosphatase: 119 U/L (ref 38–126)
Anion gap: 11 (ref 5–15)
BUN: 20 mg/dL (ref 8–23)
CO2: 26 mmol/L (ref 22–32)
Calcium: 8.4 mg/dL — ABNORMAL LOW (ref 8.9–10.3)
Chloride: 94 mmol/L — ABNORMAL LOW (ref 98–111)
Creatinine, Ser: 0.87 mg/dL (ref 0.61–1.24)
GFR calc Af Amer: >60 mL/min (ref >60)
GFR calc non Af Amer: >60 mL/min (ref >60)
Glucose, Bld: 105 mg/dL — ABNORMAL HIGH (ref 70–99)
Potassium: 3.6 mmol/L (ref 3.5–5.1)
Sodium: 131 mmol/L — ABNORMAL LOW (ref 135–145)
Total Bilirubin: 1.5 mg/dL — ABNORMAL HIGH (ref 0.3–1.2)
Total Protein: 5.6 g/dL — ABNORMAL LOW (ref 6.5–8.1)

## 2019-03-20 LAB — GLUCOSE, CAPILLARY
Glucose-Capillary: 96 mg/dL (ref 70–99)
Glucose-Capillary: 179 mg/dL — ABNORMAL HIGH (ref 70–99)
Glucose-Capillary: 100 mg/dL — ABNORMAL HIGH (ref 70–99)
Glucose-Capillary: 131 mg/dL — ABNORMAL HIGH (ref 70–99)

## 2019-03-20 LAB — CBC
HCT: 35 % — ABNORMAL LOW (ref 39.0–52.0)
Hemoglobin: 11.8 g/dL — ABNORMAL LOW (ref 13.0–17.0)
MCH: 33 pg (ref 26.0–34.0)
MCHC: 33.7 g/dL (ref 30.0–36.0)
MCV: 97.8 fL (ref 80.0–100.0)
Platelets: 308 10*3/uL (ref 150–400)
RBC: 3.58 MIL/uL — ABNORMAL LOW (ref 4.22–5.81)
RDW: 13.4 % (ref 11.5–15.5)
WBC: 15.4 10*3/uL — ABNORMAL HIGH (ref 4.0–10.5)
nRBC: 0 % (ref 0.0–0.2)

## 2019-03-20 LAB — PREALBUMIN: Prealbumin: 14.1 mg/dL — ABNORMAL LOW (ref 18–38)

## 2019-03-20 MED ORDER — METOPROLOL TARTRATE 50 MG PO TABS
50.0000 mg | ORAL_TABLET | Freq: Two times a day (BID) | ORAL | Status: DC
  Administered 2019-03-20 – 2019-03-23 (×7): 50 mg via ORAL
  Filled 2019-03-20 (×7): qty 1

## 2019-03-20 MED ORDER — DILTIAZEM HCL ER 60 MG PO CP12
120.0000 mg | ORAL_CAPSULE | Freq: Every day | ORAL | Status: DC
  Administered 2019-03-21: 120 mg via ORAL
  Filled 2019-03-20 (×2): qty 2

## 2019-03-20 MED ORDER — POTASSIUM CHLORIDE CRYS ER 20 MEQ PO TBCR
20.0000 meq | EXTENDED_RELEASE_TABLET | ORAL | Status: AC
  Administered 2019-03-20 (×3): 20 meq via ORAL
  Filled 2019-03-20 (×3): qty 1

## 2019-03-20 MED ORDER — DOXYCYCLINE HYCLATE 100 MG PO TABS
100.0000 mg | ORAL_TABLET | Freq: Two times a day (BID) | ORAL | Status: DC
  Administered 2019-03-20 – 2019-03-21 (×3): 100 mg via ORAL
  Filled 2019-03-20 (×3): qty 1

## 2019-03-20 MED ORDER — CEPHALEXIN 500 MG PO CAPS: 500.0000 mg | ORAL_CAPSULE | Freq: Three times a day (TID) | ORAL | Status: DC

## 2019-03-20 MED ORDER — FUROSEMIDE 10 MG/ML IJ SOLN
40.0000 mg | Freq: Every day | INTRAMUSCULAR | Status: DC
  Administered 2019-03-21 – 2019-03-23 (×3): 40 mg via INTRAVENOUS
  Filled 2019-03-20 (×3): qty 4

## 2019-03-20 NOTE — Consult Note (Signed)
Inpatient Rehab Admissions:  Inpatient Rehab Consult received.  I met with pt and his wife at the bedside for rehabilitation assessment. Pt appears to be a great candidate for CIR and would like to proceed with an IP Rehab admission. I have confirmed DC support with his wife. I will begin insurance authorization process for possible admit. Will update once there has been a determination from his insurance company regarding CIR.   Please call if questions.   Jhonnie Garner, OTR/L  Rehab Admissions Coordinator  [phone] 03/20/2019 3:09 PM

## 2019-03-20 NOTE — Progress Notes (Addendum)
Progress Note  Patient Name: Martin [name] Date of Encounter: 03/20/2019  Primary Cardiologist: Kate Sable, MD   Subjective  O/N events: No seizure on EEG  Martin [name] was examined and evaluated at bedside this AM. He was observed resting comfortably in bed. He mentions that he is having difficulty urinating. Bedside nurse mentions he had significant output with straight I&O cath. Denies any chest pain, palpitations, fevers, chills. Still having difficulty with movement due to left sided weakness.  Inpatient Medications    Scheduled Meds: . allopurinol  300 mg Oral q morning - 10a  . amiodarone  400 mg Oral BID  . apixaban  5 mg Oral BID  . aspirin EC  81 mg Oral Daily  . atorvastatin  80 mg Oral q1800  . bisacodyl  10 mg Rectal Daily   Or  . bisacodyl  10 mg Oral Daily  . Chlorhexidine Gluconate Cloth  6 each Topical Daily  . diltiazem  120 mg Oral Q12H  . docusate sodium  200 mg Oral Daily  . fluticasone  1 spray Each Nare Daily  . folic acid  1 mg Oral Daily  . furosemide  40 mg Intravenous BID  . guaiFENesin  600 mg Oral BID  . insulin aspart  0-24 Units Subcutaneous TID AC & HS  . insulin detemir  10 Units Subcutaneous Daily  . metoprolol tartrate  25 mg Oral BID  . mometasone-formoterol  2 puff Inhalation BID  . pantoprazole  40 mg Oral Daily  . potassium chloride  20 mEq Oral Q4H  . sodium chloride flush  10-40 mL Intracatheter Q12H  . sodium chloride flush  3 mL Intravenous Q12H  . thiamine  100 mg Oral Daily  . vitamin B-12  100 mcg Oral Daily   Continuous Infusions: . sodium chloride     PRN Meds: ALPRAZolam, hydrALAZINE, ipratropium-albuterol, magnesium citrate, ondansetron (ZOFRAN) IV, oxyCODONE, sodium chloride flush, sodium chloride flush, traMADol   Vital Signs    Vitals:   03/20/19 0340 03/20/19 0400 03/20/19 0500 03/20/19 0600  BP: 139/65 (!) 110/96  138/81  Pulse: 67 71 71 69  Resp: 18 17 17 18   Temp:  98.1 F (36.7 C)     TempSrc:  Oral    SpO2:  91% 91% 94%  Weight:   123.4 kg   Height:        Intake/Output Summary (Last 24 hours) at 03/20/2019 0706 Last data filed at 03/19/2019 2200 Gross per 24 hour  Intake 1050 ml  Output 1525 ml  Net -475 ml   Filed Weights   03/18/19 0500 03/19/19 0430 03/20/19 0500  Weight: 127.5 kg 126.6 kg 123.4 kg    Telemetry    Irregularly irregular but not RVR HR ~80-100 - Personally Reviewed  ECG    None from this am - Personally Reviewed  Physical Exam   Gen: Well-developed, obese, NAD HEENT: NCAT head, hearing intact, EOMI, Neck: supple, ROM intact, no JVD CV: RRR, S1, S2 normal, No rubs, no murmurs, no gallops, surgical scar intact w/o significant swelling, erythema or drainage Pulm: CTAB no rales, no wheezes Extm: Peripheral pulses intact, pitting edema upper extremities Skin: Dry, Warm, normal turgor  Neuro: AAOx3, Continued lack of strength on LLE  Labs    Chemistry Recent Labs  Lab 03/17/19 0408 03/18/19 0426 03/20/19 0436  NA 128* 131* 131*  K 3.7 3.5 3.6  CL 90* 96* 94*  CO2 25 25 26   GLUCOSE 144* 108* 105*  BUN  21 18 20   CREATININE 0.86 0.71 0.87  CALCIUM 8.2* 7.9* 8.4*  PROT 5.4* 5.2* 5.6*  ALBUMIN 2.2* 2.2* 2.5*  AST 50* 95* 72*  ALT 40 79* 82*  ALKPHOS 155* 130* 119  BILITOT 1.2 1.1 1.5*  GFRNONAA >60 >60 >60  GFRAA >60 >60 >60  ANIONGAP 13 10 11      Hematology Recent Labs  Lab 03/18/19 0426 03/19/19 0429 03/20/19 0436  WBC 11.7* 13.9* 15.4*  RBC 3.48* 3.50* 3.58*  HGB 11.6* 11.7* 11.8*  HCT 33.7* 34.2* 35.0*  MCV 96.8 97.7 97.8  MCH 33.3 33.4 33.0  MCHC 34.4 34.2 33.7  RDW 13.7 13.6 13.4  PLT 260 282 308    Cardiac EnzymesNo results for input(s): TROPONINI in the last 168 hours. No results for input(s): TROPIPOC in the last 168 hours.   BNP No results for input(s): BNP, PROBNP in the last 168 hours.   DDimer No results for input(s): DDIMER in the last 168 hours.   Radiology    Dg Chest 2 View   Result Date: 03/20/2019 CLINICAL DATA:  Post CABG EXAM: CHEST - 2 VIEW COMPARISON:  Radiograph 03/18/2019 FINDINGS: Right PICC terminates at the superior cavoatrial junction. Postsurgical changes related to prior CABG including intact and aligned sternotomy wires and multiple surgical clips projecting over the mediastinum. Stable postoperative mediastinal widening. Bandlike areas of opacity likely reflect atelectatic change. Suspect at least small bilateral effusions. Nodular density in the left mid lung may reflect a hematoma at the site of prior chest tube placement. No acute osseous or soft tissue abnormality. IMPRESSION: Stable appearance of bilateral effusions and basilar atelectasis. Stable nodular density in the left mid lung, possibly hematoma at site of prior chest tube. Electronically Signed   By: Lovena Le M.D.   On: 03/20/2019 05:59    Cardiac Studies   Echo 03/08/19: IMPRESSIONS  1. Left ventricular ejection fraction, by visual estimation, is 55 to 60%. The left ventricle has normal function. Normal left ventricular size. There is mildly increased left ventricular hypertrophy. Basal inferior akinesis. 2. Left ventricular diastolic Doppler parameters are consistent with impaired relaxation pattern of LV diastolic filling. 3. Global right ventricle has normal systolic function.The right ventricular size is normal. No increase in right ventricular wall thickness. 4. The aortic valve is tricuspid Aortic valve regurgitation is trivial by color flow Doppler. Mild aortic valve stenosis. Mean gradient 10 mmHg. 5. There is mild dilatation of the ascending aorta measuring 42 mm. 6. Left atrial size was moderately dilated. 7. Right atrial size was mildly dilated. 8. The tricuspid valve is normal in structure. Tricuspid valve regurgitation was not visualized by color flow Doppler. 9. The mitral valve is normal in structure. No evidence of mitral valve regurgitation. No evidence of  mitral stenosis. 10. TR signal is inadequate for assessing pulmonary artery systolic pressure. 11. The inferior vena cava is normal in size with greater than 50% respiratory variability, suggesting right atrial pressure of 3 mmHg.  Carotid Dopplers 03/09/2019: 1 to 39% ICA stenosis bilaterally.  Left heart cath 03/08/2019:  Severe three-vessel coronary calcification  Patent left main  Eccentric ostial 50 to 75% LAD with mid vessel 40 to 50% narrowing. Mid to distal LAD stent is widely patent.  Ramus intermedius contains proximal 90% stenosis.  Circumflex contains 99% proximal/ostial stenosis.  RCA is heavily calcified and stented. The artery is totally occluded in the proximal to mid segment. Left-to-right collaterals are noted.  Mildly reduced LV systolic function with EF estimated to  be 40%. LVEDP is 28 mmHg. Findings compatible with acute on chronic combined   Patient Profile     Mr.Lanigan is a 63 yo M w/ PMH of prior CAD s/p pCI, PAF, HTN, OSA on CPAP presented with chest pain, found to have NSTEMI w/ multivessel dz requiring CABG. Hospital course complicated by acute ACA stroke.  Assessment & Plan    Leukocytosis Trending leukocytosis w/o fever. UA w/o significant findings. Cultures pending. Foley removed. Urinary retention noted over weekend. - F/u cultures - No indication for abx  Acute ACA stroke Followed by neurology. PT/OT recommend CIR. LLE still w/ significant weakness. Speech cleared for oral meds - C/w Eliquis - CIR per PT/OT  NSTEMI Multi-vessel CAD Cath with multi-vessel dz. S/p CABG post op day 9. No significant chest pain per pt. Post-op Echo w/ no significant different.. - C/w asa, atorvastatin - Start DAPT when cleared by CT surgery.  Diastolic heart failure TEE post Cabg w/ preserved EF. Urine output decreased from 4L daily to 1.5L yesterday after foley removal. Weight this am 123.4kg, currently very close to last known dry weight (122kg).  Creatinine this am 0.87 - C/w furosemide 40 BID - Daily weights, strict I&Os - Trend/replete K, Mag as appropriate   PAF Developed A.fib w/ RVR couple days ago. On amio. Still irregular but not in RVR. Current rate ~90.  - C/w metoprolol, diltiazem. - C/w oral amio 400mg  BID - C/w Eliquis  For questions or updates, please contact [place] Please consult [email or web address] for contact info under Cardiology/STEMI.   Signed, Gilberto Better, MD PGY-2, [facility] [facility] Pager: [phone] 03/20/2019, 7:06 AM    Attending attestation to follow  I have seen and examined the patient along with Gilberto Better, MD.  I have reviewed the chart, notes and new data.  I agree with his note.  Key new complaints: no angina or chest wall pain. A little improvement in left leg strength Key examination changes: 2/5 strength L leg, irregular rhythm Key new findings / data: rate controlled atrial fibrillation on telemetry  PLAN: Potential transfer to CIR in 48 h. On DOAC. Will try to simplify the rate control regimen to beta blockers only, reduce diltiazem.  Sanda Klein, MD, [place] [phone] 03/20/2019, 12:11 PM

## 2019-03-20 NOTE — Progress Notes (Signed)
EVENING ROUNDS NOTE :     [place]       [place] [address]             [phone]                 10 Days Post-Op Procedure(s) (LRB): CORONARY ARTERY BYPASS GRAFTING (CABG)X 3 Using Left Internal Mammary Artery and Right Saphenous Vein Grafts (N/A) TRANSESOPHAGEAL ECHOCARDIOGRAM (TEE) (N/A)  Total Length of Stay:  LOS: 13 days  BP 122/88   Pulse 67   Temp 98 F (36.7 C) (Oral)   Resp (!) 22   Ht 5\' 8"  (1.727 m)   Wt 123.4 kg   SpO2 (!) 80%   BMI 41.36 kg/m   .Intake/Output      10/12 0701 - 10/13 0700   P.O. 240   Total Intake(mL/kg) 240 (1.9)   Urine (mL/kg/hr) 550 (0.3)   Stool 0   Total Output 550   Net -310       Urine Occurrence 1 x   Stool Occurrence 1 x     . sodium chloride       Lab Results  Component Value Date   WBC 15.4 (H) 03/20/2019   HGB 11.8 (L) 03/20/2019   HCT 35.0 (L) 03/20/2019   PLT 308 03/20/2019   GLUCOSE 105 (H) 03/20/2019   CHOL 170 03/07/2019   TRIG 218 (H) 03/07/2019   HDL 41 03/07/2019   LDLCALC 85 03/07/2019   ALT 82 (H) 03/20/2019   AST 72 (H) 03/20/2019   NA 131 (L) 03/20/2019   K 3.6 03/20/2019   CL 94 (L) 03/20/2019   CREATININE 0.87 03/20/2019   BUN 20 03/20/2019   CO2 26 03/20/2019   TSH 10.690 (H) 03/12/2019   INR 1.4 (H) 03/10/2019   HGBA1C 5.6 03/09/2019     Atrial Fibrillation, rate controlled Urinary retention, foley replaced today Deconditioning due to post operative stroke.. patient awaiting placement in CIR,   Awaiting transfer to [place], PA-C     03/20/2019 10:11 PM

## 2019-03-20 NOTE — PMR Pre-admission (Signed)
PMR Admission Coordinator Pre-Admission Assessment  Patient: Martin [name] is an 63 y.o., male MRN: [id number] DOB: 12-20-1955 Height: '5\' 8"'  (172.7 cm) Weight: 123.4 kg  Insurance Information HMO: yes    PPO:      PCP:      IPA:      80/20:      OTHER:  PRIMARY: [employer] Medicare      Policy#: [id number]      Subscriber: Patient CM Name: Martin [name]      Phone#: [phone]     Fax#: [phone] Pre-Cert#:[id number]   Employer:  Martin [name] provided by Martin [name] on 10/19 for admit to CIR. Pt is approved for 8 days starting 10/19. Next review date is 10/26. Fax clinical updates to [phone] (no follow up CM assigned at this time).  Benefits:  Phone #: [phone]     Name: [email or web address] Eff. Date: 10/07/2018 - 06/08/2019      Deduct: does not have ($0)      Out of Pocket Max: $4,500 ($36.61 met)      Life Max: NA CIR: $325/day co-pay for days 1-5, $0/day co-pay for days 6+      SNF: $0/day co-pay for days 1-20, $160/day co-pay for days 21-49, $0/day co-pay for days 50-100; limited to 100 days/cal yr Outpatient: limited by medical necessity     Co-Pay: $35/visit Home Health: 100% coverage; limited by medical necessity      Co-Pay: 0% co-insurance DME: 80% coverage     Co-Pay: 20% co-insurance Providers:  SECONDARY: None      Policy#:       Subscriber:  CM Name:       Phone#:      Fax#:  Pre-Cert#:       Employer:  Benefits:  Phone #:      Name:  Eff. Date:      Deduct:       Out of Pocket Max:       Life Max CIR:       SNF:  Outpatient:      Co-Pay:  Home Health:       Co-Pay:  DME:      Co-Pay:   Medicaid Application Date:       Case Manager:  Disability Application Date:       Case Worker:   The "Data Collection Information Summary" for patients in Inpatient Rehabilitation Facilities with attached "Privacy Act [place] Records" was provided and verbally reviewed with: Patient  Emergency Contact Information Contact Information    Name Relation Home Work [place] [phone]  [phone]   Martin, [name] Daughter   [phone]      Current Medical History  Patient Admitting Diagnosis: right posterior ACA infarct after recent CABG x3  History of Present Illness: Martin [name] is a 63 year old male with history of CAD/PAF-on Eliquis, OSA,  who was admitted on 03/07/19 with CP and had mild elevation in troponins with subtle EKG changes concerning for NSTEMI.  2D echo showed mild increase in LVH with basal inferior akinesis and EF 55-60%. He underwent cardiac cath revealing severe 3V CAD and underwent CABG X 3 by Dr. Prescott Gum on 03/11/19. He tolerated extubation with out difficulty but had confusion and was noted to have left sided weakness on 10/5 am.  CT head done revealing small acute cortical distal R-ACA territory infarct and progressive small vessel disease. CTA head/neck was negative for LVO, revealed new focus of calcification in R-ACA distal A2 segment  which could indicated partially calcified thrombus, fetal origins PCA as well as incidental findings of large loculated component of left effusion and retrosternal 4.3X 2.0X 5.2 cm fluid collection favored to be seroma. He developed A fib with RVR as well as VT and was started on amiodarone on 10/5. Cardizem added for rate control and due to low BP maintained on milrinone due to fluid overload. Dr. Erlinda Hong recommends BP goal 110-140 to maintain adequate cerebral perfusion.    Follow up CT head without bleed therefore heparin added on 10/06 and transitioned to Eliquis by 10/08.  EEG done due to somnolence and was negative for seizures. He has had issues with urinary retention requiring I/O caths and foley replaced. Felt voiding problem most like obstructive not neurogenic with flomax stated on 10/17; plans for voiding trials in next few days. He did develop progressive leucocytosis with WBC- 20.1 on 10/13 and started on Cipro empirically. UCS negative and CXR done revealing stable bilateral effusion  snd stable nodular density left mid lung possibly hematoma at side of prior chest tube. Leucocytosis resolving but 10/15 pm, he did have unresponsive episode with seizure type episode and 14.5 seconds asystole felt to be vasovagal in nature. Pt has been evaluated by therapies with recommendation for CIR. Pt is to be admitted to CIR on 03/27/2019.   Complete NIHSS TOTAL: 2  Patient's medical [facility] has been reviewed by the rehabilitation admission coordinator and physician.  Past Medical History  Past Medical History:  Diagnosis Date  . Atrial fibrillation ([place])    a. dx 10/2017. b. s/p DCCV on 11/19/2017 with successful conversion to NSR but back in AFIB 3 weeks later  . CAD (coronary artery disease)    a. s/p PCI to LAD 2001. b. s/p stenting to the RCA and mid LAD December 2011, residual distal RCA disease treated medically. b. nuc 02/2013 abnormal with fixed defect seen in inferoapical, mid-inferior, and basal inferior walls, indicative of myocardial scar, no evidence of ischemia, EF 41% (Ef 55-60% by echo same time).  . Carotid artery disease ([place])    carotid Doppler course left side 5069% stenosis  . Dysrhythmia    AFib  . Habitual alcohol use   . Hyperlipidemia   . Hypertension   . Low back pain   . OSA on CPAP   . Osteoarthritis   . Polysubstance abuse ([place])    use including marijuana and vicodin,which he obtained from the street  . PVC's (premature ventricular contractions)    status post Holter monitor normal sinus rhythm otherwise asymptomatic PVCs.  . Seasonal allergies     Family History   family history includes Heart attack in his brother and father; Other in his mother; Stroke in his mother.  Prior Rehab/Hospitalizations Has the patient had prior rehab or hospitalizations prior to admission? No  Has the patient had major surgery during 100 days prior to admission? Yes   Current Medications  Current Facility-Administered  Medications:  .  0.9 %  sodium chloride infusion, 250 mL, Intravenous, Continuous, Gold, Wayne E, PA-C .  allopurinol (ZYLOPRIM) tablet 300 mg, 300 mg, Oral, q morning - 10a, Gold, Wayne E, PA-C, 300 mg at 03/20/19 0947 .  ALPRAZolam Duanne Moron) tablet 0.25 mg, 0.25 mg, Oral, TID PRN, Grace Isaac, MD, 0.25 mg at 03/19/19 0754 .  amiodarone (PACERONE) tablet 400 mg, 400 mg, Oral, BID, Prescott Gum, Collier Salina, MD, 400 mg at 03/20/19 0949 .  aspirin EC tablet 81 mg, 81  mg, Oral, Daily, 81 mg at 03/20/19 0950 **OR** [DISCONTINUED] aspirin chewable tablet 324 mg, 324 mg, Per Tube, Daily, Gold, Wayne E, PA-C .  atorvastatin (LIPITOR) tablet 80 mg, 80 mg, Oral, q1800, Gold, Wayne E, PA-C, 80 mg at 03/19/19 1702 .  bisacodyl (DULCOLAX) EC tablet 10 mg, 10 mg, Oral, Daily, 10 mg at 03/17/19 0934 **OR** bisacodyl (DULCOLAX) suppository 10 mg, 10 mg, Rectal, Daily, Prescott Gum, Collier Salina, MD .  Chlorhexidine Gluconate Cloth 2 % PADS 6 each, 6 each, Topical, Daily, Prescott Gum, Collier Salina, MD, 6 each at 03/20/19 [phone] .  [START ON 03/21/2019] diltiazem (CARDIZEM SR) 12 hr capsule 120 mg, 120 mg, Oral, Daily, Croitoru, Mihai, MD .  docusate sodium (COLACE) capsule 200 mg, 200 mg, Oral, Daily, Gold, Wayne E, PA-C, 200 mg at 03/17/19 0934 .  doxycycline (VIBRA-TABS) tablet 100 mg, 100 mg, Oral, Q12H, Prescott Gum, Collier Salina, MD, 100 mg at 03/20/19 0947 .  fluticasone (FLONASE) 50 MCG/ACT nasal spray 1 spray, 1 spray, Each Nare, Daily, Gold, Wayne E, PA-C, 1 spray at 03/20/19 0951 .  folic acid (FOLVITE) tablet 1 mg, 1 mg, Oral, Daily, Gold, Wayne E, PA-C, 1 mg at 03/20/19 0947 .  [START ON 03/21/2019] furosemide (LASIX) injection 40 mg, 40 mg, Intravenous, Daily, Prescott Gum, Collier Salina, MD .  guaiFENesin [facility]) 12 hr tablet 600 mg, 600 mg, Oral, BID, Prescott Gum, Collier Salina, MD, 600 mg at 03/20/19 0949 .  [CANCELED] CBG monitoring, , , Q4H **AND** insulin aspart (novoLOG) injection 0-24 Units, 0-24 Units, Subcutaneous, TID AC & HS, Prescott Gum, Collier Salina,  MD, 4 Units at 03/20/19 1144 .  insulin detemir (LEVEMIR) injection 10 Units, 10 Units, Subcutaneous, Daily, Grace Isaac, MD, 10 Units at 03/20/19 [phone] .  ipratropium-albuterol (DUONEB) 0.5-2.5 (3) MG/3ML nebulizer solution 3 mL, 3 mL, Nebulization, Q6H PRN, Prescott Gum, Collier Salina, MD .  magnesium citrate solution 1 Bottle, 1 Bottle, Oral, PRN, Mosetta Anis, MD .  metoprolol tartrate (LOPRESSOR) tablet 50 mg, 50 mg, Oral, BID **OR** [DISCONTINUED] metoprolol tartrate (LOPRESSOR) 25 mg/10 mL oral suspension 12.5 mg, 12.5 mg, Per Tube, BID, Gold, Wayne E, PA-C .  mometasone-formoterol (DULERA) 200-5 MCG/ACT inhaler 2 puff, 2 puff, Inhalation, BID, Prescott Gum, Collier Salina, MD, 2 puff at 03/20/19 0820 .  ondansetron (ZOFRAN) injection 4 mg, 4 mg, Intravenous, Q6H PRN, Gold, Wayne E, PA-C, 4 mg at 03/15/19 1638 .  oxyCODONE (Oxy IR/ROXICODONE) immediate release tablet 5-10 mg, 5-10 mg, Oral, Q3H PRN, Gold, Wayne E, PA-C, 5 mg at 03/17/19 1841 .  pantoprazole (PROTONIX) EC tablet 40 mg, 40 mg, Oral, Daily, Gold, Wayne E, PA-C, 40 mg at 03/20/19 0947 .  potassium chloride SA (KLOR-CON) CR tablet 20 mEq, 20 mEq, Oral, Q4H, Prescott Gum, Collier Salina, MD, 20 mEq at 03/20/19 1145 .  sodium chloride flush (NS) 0.9 % injection 10-40 mL, 10-40 mL, Intracatheter, Q12H, Prescott Gum, Collier Salina, MD, 10 mL at 03/20/19 0955 .  sodium chloride flush (NS) 0.9 % injection 10-40 mL, 10-40 mL, Intracatheter, PRN, Prescott Gum, Collier Salina, MD .  sodium chloride flush (NS) 0.9 % injection 3 mL, 3 mL, Intravenous, Q12H, Gold, Wayne E, PA-C, 3 mL at 03/19/19 2201 .  sodium chloride flush (NS) 0.9 % injection 3 mL, 3 mL, Intravenous, PRN, Gold, Wayne E, PA-C .  thiamine (VITAMIN B-1) tablet 100 mg, 100 mg, Oral, Daily, Prescott Gum, Collier Salina, MD, 100 mg at 03/20/19 0949 .  traMADol (ULTRAM) tablet 50-100 mg, 50-100 mg, Oral, Q4H PRN, Gold, Wayne E, PA-C, 50  mg at 03/19/19 0754 .  vitamin B-12 (CYANOCOBALAMIN) tablet 100 mcg, 100 mcg, Oral, Daily, Grace Isaac, MD, 100 mcg at 03/20/19 0950  Patients Current Diet:  Diet Order            Diet heart healthy/carb modified Room service appropriate? Yes; Fluid consistency: Thin  Diet effective now              Precautions / Restrictions Precautions Precautions: Fall, Sternal Precaution Booklet Issued: No Precaution Comments: Lft hemiparesis Restrictions Weight Bearing Restrictions: Yes Other Position/Activity Restrictions: sternal precautions    Has the patient had 2 or more falls or a fall with injury in the past year? 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 Independent without AD.   Prior Functional Level Self Care: Did the patient need help bathing, dressing, using the toilet or eating? Independent  Indoor Mobility: Did the patient need assistance with walking from room to room (with or without device)? Independent  Stairs: Did the patient need assistance with internal or external stairs (with or without device)? Independent  Functional Cognition: Did the patient need help planning regular tasks such as shopping or remembering to take medications? Independent  Home Assistive Devices / Equipment Home Assistive Devices/Equipment: None Home Equipment: Grab bars - tub/shower, Cane - single point  Prior Device Use: Indicate devices/aids used by the patient prior to current illness, exacerbation or injury? None of the above  Current Functional Level Cognition  Arousal/Alertness: Awake/alert Overall Cognitive Status: Within Functional Limits for tasks assessed Current Attention Level: Sustained Orientation Level: Oriented X4 Following Commands: Follows one step commands with increased time, Follows one step commands consistently Safety/Judgement: Decreased awareness of safety, Decreased awareness of deficits General Comments: Motivated and eager to work with PT Attention: Selective Selective Attention: Impaired Selective  Attention Impairment: Verbal basic Memory: Impaired Memory Impairment: Retrieval deficit, Decreased recall of new information, Storage deficit Awareness: Impaired Awareness Impairment: Anticipatory impairment Problem Solving: Impaired Problem Solving Impairment: Functional basic Safety/Judgment: Impaired    Extremity Assessment (includes Sensation/Coordination)  Upper Extremity Assessment: Defer to OT evaluation LUE Deficits / Details: hemiparesis, 0/5 shoulder, 2-/5 elbow, and 3-/5 hand grossly; edematous LUE Sensation: decreased light touch, decreased proprioception LUE Coordination: decreased fine motor, decreased gross motor  Lower Extremity Assessment: LLE deficits/detail, Generalized weakness LLE Deficits / Details: 0/5 hip flexion/extension, hip abd/add, knee flexion/extension, DF/PF. PT able to perform full PROM DF/PF, knee flexion/extension LLE Sensation: WNL(sensation screen WFL per pt report)    ADLs  Overall ADL's : Needs assistance/impaired Grooming: Maximal assistance, Sitting Upper Body Bathing: Maximal assistance, Sitting Lower Body Bathing: Maximal assistance, +2 for physical assistance, Bed level Upper Body Dressing : Total assistance, Sitting Lower Body Dressing: Total assistance, +2 for physical assistance, Bed level Toilet Transfer Details (indicate cue type and reason): deferred Functional mobility during ADLs: Total assistance, +2 for physical assistance, +2 for safety/equipment General ADL Comments: limited to EOB only, pt limited by pain, L sided hemiparesis, decreased activity tolerance, impaired balance and cognition     Mobility  Overal bed mobility: Needs Assistance Bed Mobility: Supine to Sit Rolling: +2 for physical assistance, Max assist Supine to sit: Max assist, HOB elevated Sit to sidelying: +2 for physical assistance, Max assist General bed mobility comments: Max A for LLE management, pt assisting well with R-side and LUE as able, assist for  trunk elevation and scooting Lft hip to EOB, 2/4 DOE.    Transfers  Overall transfer level:  Needs assistance Equipment used: Rolling walker (2 wheeled), None Transfer via Lift Equipment: Stedy Transfers: Sit to/from Stand Sit to Stand: Mod assist, Min assist, +2 physical assistance General transfer comment: Stood from EOB x1 with Mod A of 2 using back of recliner once upright for support; hands on knees to stand. LLE locked out initially. Stood again from EOB with use of RW for support, Min A of 2 with hands on knees. Transferred to chair post ambulation.    Ambulation / Gait / Stairs / Wheelchair Mobility  Ambulation/Gait Ambulation/Gait assistance: Mod assist, +2 safety/equipment, +2 physical assistance Gait Distance (Feet): 6 Feet Assistive device: Rolling walker (2 wheeled) Gait Pattern/deviations: Step-to pattern, Step-through pattern, Decreased stance time - left, Decreased step length - right, Trunk flexed, Wide base of support General Gait Details: Slow, unsteady gait with difficulty progressing LLE, Left knee locked out into extension during stance, assist for weight shifting. LLE positioned into left hip external rotation. no buckling noted. 2-3/4 DOE. Not getting good Sp02 doing activity. Gait velocity: decreased Gait velocity interpretation: <1.31 ft/sec, indicative of household ambulator    Posture / Balance Dynamic Sitting Balance Sitting balance - Comments: Close Min guard for safety due to pt feeling uneasy EOB. Balance Overall balance assessment: Needs assistance Sitting-balance support: Feet supported, No upper extremity supported Sitting balance-Leahy Scale: Fair Sitting balance - Comments: Close Min guard for safety due to pt feeling uneasy EOB. Postural control: Left lateral lean Standing balance support: During functional activity Standing balance-Leahy Scale: Poor Standing balance comment: Reliant on UE support and external assist. Able to stand and perform weight  shifts holdong onto back of chair for support, able mini marches. LLE locked out into extension but able to be in mid range with cues without buckling.    Special needs/care consideration BiPAP/CPAP : yes CPAP  CPM : no Continuous Drip IV : 0.9% sodium chloride infusion  Dialysis : no        Days : no Life Vest : no Oxygen : 2-3 L/min  Special Bed : no Trach Size : no Wound Vac (area) : no      Location : no Skin: abrasion to left leg, ecchymosis to leg; arm (right; left). Surgical incision to chest, close right leg incision, laceration to sternum lower incision position wire                      Bowel mgmt: continent, last BM: 03/26/2019 Bladder mgmt: continent; urinary catheter in place Diabetic mgmt: no Behavioral consideration : no Chemo/radiation : no   Previous Home Environment (from acute therapy documentation) Living Arrangements: Spouse/significant other  Lives With: Spouse Available Help at Discharge: Family, Available 24 hours/day Type of Home: House Home Layout: One level, Other (Comment)(den with 2 steps up, on L side ) Home Access: Other (comment)(threshold) Bathroom Shower/Tub: Multimedia programmer: Standard Home Care Services: No  Discharge Living Setting Plans for Discharge Living Setting: Patient's home, Lives with (comment)(spouse) Type of Home at Discharge: House Discharge Home Layout: One level(*but has sunken deen with 2 steps to get to kitchen/LR) Discharge Home Access: Level entry Discharge Bathroom Shower/Tub: Walk-in shower Discharge Bathroom Toilet: Standard Discharge Bathroom Accessibility: Yes How Accessible: Accessible via walker Does the patient have any problems obtaining your medications?: No  Social/Family/Support Systems Patient Roles: Spouse(full time worker) Contact Information: wife: Kenney Houseman (home [phone]; cell: [phone]) Anticipated Caregiver: wife Anticipated Caregiver's Contact Information: see  above Ability/Limitations of Caregiver: Min A Caregiver Availability: 24/7 Discharge  Plan Discussed with Primary Caregiver: Yes Is Caregiver In Agreement with Plan?: Yes Does Caregiver/Family have Issues with Lodging/Transportation while Pt is in Rehab?: No  Goals/Additional Needs Patient/Family Goal for Rehab: PT/OT: Supervision; SLP: Mod I Expected length of stay: 8-12 days Cultural Considerations: NA Dietary Needs: heart healthy/carb modified; thin liquids Equipment Needs: TBD Pt/Family Agrees to Admission and willing to participate: Yes Program Orientation Provided & Reviewed with Pt/Caregiver Including Roles  & Responsibilities: Yes  Barriers to Discharge: Home environment access/layout, New oxygen  Barriers to Discharge Comments: sunken level for living room and kitchen  Decrease burden of Care through IP rehab admission: NA  Possible need for SNF placement upon discharge: Not anticipated; pt has good social support at DC from his wife and has good prognosis for further progress through CIR.   Patient Condition: I have reviewed medical records from [facility], spoken with MD, PA, RN, PT, and patient and spouse. I met with patient at the bedside for inpatient rehabilitation assessment.  Patient will benefit from ongoing PT and OT, can actively participate in 3 hours of therapy a day 5 days of the week, and can make measurable gains during the admission.  Patient will also benefit from the coordinated team approach during an Inpatient Acute Rehabilitation admission.  The patient will receive intensive therapy as well as Rehabilitation physician, nursing, social worker, and care management interventions.  Due to bladder management, bowel management, safety, skin/wound care, disease management, medication administration, pain management and patient education the patient requires 24 hour a day rehabilitation nursing.  The patient is currently Min/Mod A x2 with mobility and  Min to Mod A x2 for basic ADLs.  Discharge setting and therapy post discharge at home with home health is anticipated.  Patient has agreed to participate in the Acute Inpatient Rehabilitation Program and will admit 03/27/2019.  Preadmission Screen Completed By:  Raechel Ache, 03/20/2019 4:41 PM ______________________________________________________________________   Discussed status with Dr. Naaman Plummer on 03/27/2019 at 10:45AM and received approval for admission today.  Admission Coordinator:  Raechel Ache, OT, time 10:45AM/Date 03/27/2019   Assessment/Plan: Diagnosis: right ACA infarct after CABG 1. Does the need for close, 24 hr/day Medical supervision in concert with the patient's rehab needs make it unreasonable for this patient to be served in a less intensive setting? Yes 2. Co-Morbidities requiring supervision/potential complications: CAD, afib, neurogenic bladder, left lung effusion 3. Due to bladder management, bowel management, safety, skin/wound care, disease management, medication administration, pain management and patient education, does the patient require 24 hr/day rehab nursing? Yes 4. Does the patient require coordinated care of a physician, rehab nurse, PT, OT, and SLP to address physical and functional deficits in the context of the above medical diagnosis(es)? Yes Addressing deficits in the following areas: balance, endurance, locomotion, strength, transferring, bowel/bladder control, bathing, dressing, feeding, grooming, toileting, cognition, swallowing and psychosocial support 5. Can the patient actively participate in an intensive therapy program of at least 3 hrs of therapy 5 days a week? Yes 6. The potential for patient to make measurable gains while on inpatient rehab is excellent 7. Anticipated functional outcomes upon discharge from inpatient rehab: supervision PT, supervision OT, modified independent SLP 8. Estimated rehab length of stay to reach the above functional goals  is: 8-12 days 9. Anticipated discharge destination: Home 10. Overall Rehab/Functional Prognosis: excellent   MD Signature: Meredith Staggers, MD, [facility] 03/27/2019

## 2019-03-20 NOTE — Progress Notes (Signed)
10 Days Post-Op Procedure(s) (LRB): CORONARY ARTERY BYPASS GRAFTING (CABG)X 3 Using Left Internal Mammary Artery and Right Saphenous Vein Grafts (N/A) TRANSESOPHAGEAL ECHOCARDIOGRAM (TEE) (N/A) Subjective:  Patient in rate controlled atrial fibrillation, is able to get up in chair with heavy assistance Medically ready for transfer to CIR unit for post stroke therapy Pulmonary status stable on room air Difficulty with bladder outflow obstruction requiring reinsertion of Foley catheter Chest x-ray clear, white cell count 15,000, urine culture pending  Objective: Vital signs in last 24 hours: Temp:  [97.8 F (36.6 C)-99.5 F (37.5 C)] 98.2 F (36.8 C) (10/12 0700) Pulse Rate:  [58-93] 88 (10/12 0800) Cardiac Rhythm: Atrial fibrillation (10/12 0800) Resp:  [16-25] 21 (10/12 0800) BP: (101-180)/(50-108) 136/108 (10/12 0800) SpO2:  [82 %-98 %] 91 % (10/12 0823) Weight:  [123.4 kg] 123.4 kg (10/12 0500)  Hemodynamic parameters for last 24 hours:  Controlled atrial fibrillation Afebrile Stable blood pressure  Intake/Output from previous day: 10/11 0701 - 10/12 0700 In: 1050 [P.O.:1050] Out: 1525 [Urine:1525] Intake/Output this shift: Total I/O In: 240 [P.O.:240] Out: -        Exam    General- alert and comfortable    Neck- no JVD, no cervical adenopathy palpable, no carotid bruit   Lungs- clear without rales, wheezes   Cor- regular rate and rhythm, no murmur , gallop   Abdomen- soft, non-tender   Extremities - warm, non-tender, minimal edema   Neuro- oriented, appropriate,  Stable LLE  weakness   Lab Results: Recent Labs    03/19/19 0429 03/20/19 0436  WBC 13.9* 15.4*  HGB 11.7* 11.8*  HCT 34.2* 35.0*  PLT 282 308   BMET:  Recent Labs    03/18/19 0426 03/20/19 0436  NA 131* 131*  K 3.5 3.6  CL 96* 94*  CO2 25 26  GLUCOSE 108* 105*  BUN 18 20  CREATININE 0.71 0.87  CALCIUM 7.9* 8.4*    PT/INR: No results for input(s): LABPROT, INR in the last 72  hours. ABG    Component Value Date/Time   PHART 7.478 (H) 03/16/2019 1239   HCO3 24.2 03/16/2019 1239   TCO2 25 03/16/2019 1239   ACIDBASEDEF 3.0 (H) 03/10/2019 1833   O2SAT 62.5 03/18/2019 0430   CBG (last 3)  Recent Labs    03/19/19 1531 03/19/19 2130 03/20/19 0644  GLUCAP 103* 119* 96    Assessment/Plan: S/P Procedure(s) (LRB): CORONARY ARTERY BYPASS GRAFTING (CABG)X 3 Using Left Internal Mammary Artery and Right Saphenous Vein Grafts (N/A) TRANSESOPHAGEAL ECHOCARDIOGRAM (TEE) (N/A) Ready for CIR May transfer to unit 2 C until bed available on CIR Hold dose of Eliquis and DC EP W's tomorrow  LOS: 13 days    Martin [name] 03/20/2019

## 2019-03-20 NOTE — Progress Notes (Signed)
Physical Therapy Treatment Patient Details Name: Martin [name] MRN: [id number] DOB: 1955/12/21 Today's Date: 03/20/2019    History of Present Illness Pt is a 63 y/o male with PMH of  CAD, paroxysmal atrial fibrillation, OA, low back pain, polysubstance abuse, presents with chest pain and possible NSTEMI. S/p L heart cath and coronary angiography 9/30, CABG x 3 03/10/19.  Course complicated by CT revealing R distal ACA infarct 03/13/19. Pt with periods of lethargy; EEG WNL.    PT Comments    Patient progressing well towards PT goals. Tolerated gait training today with use of RW for support. Pt needed assist with weight shifting, LLE progression, balance and left knee instability. Pt with 2-3/4 DOE during activity but highly motivated to work with PT. Worked on standing bouts, weight shifting and mini marches with UE support. Continued education on sternal precautions. Would be a great rehab candidate. Will follow.    Follow Up Recommendations  CIR;Supervision/Assistance - 24 hour     Equipment Recommendations  Other (comment)(TBD next venue)    Recommendations for Other Services       Precautions / Restrictions Precautions Precautions: Fall;Sternal Precaution Booklet Issued: No Precaution Comments: Lft hemiparesis Restrictions Weight Bearing Restrictions: Yes Other Position/Activity Restrictions: sternal precautions     Mobility  Bed Mobility Overal bed mobility: Needs Assistance Bed Mobility: Supine to Sit     Supine to sit: Max assist;HOB elevated     General bed mobility comments: Max A for LLE management, pt assisting well with R-side and LUE as able, assist for trunk elevation and scooting Lft hip to EOB, 2/4 DOE.  Transfers Overall transfer level: Needs assistance Equipment used: Rolling walker (2 wheeled);None Transfers: Sit to/from Stand Sit to Stand: Mod assist;Min assist;+2 physical assistance         General transfer comment: Stood from EOB x1 with Mod  A of 2 using back of recliner once upright for support; hands on knees to stand. LLE locked out initially. Stood again from EOB with use of RW for support, Min A of 2 with hands on knees. Transferred to chair post ambulation.  Ambulation/Gait Ambulation/Gait assistance: Mod assist;+2 safety/equipment;+2 physical assistance Gait Distance (Feet): 6 Feet Assistive device: Rolling walker (2 wheeled) Gait Pattern/deviations: Step-to pattern;Step-through pattern;Decreased stance time - left;Decreased step length - right;Trunk flexed;Wide base of support Gait velocity: decreased Gait velocity interpretation: <1.31 ft/sec, indicative of household ambulator General Gait Details: Slow, unsteady gait with difficulty progressing LLE, Left knee locked out into extension during stance, assist for weight shifting. LLE positioned into left hip external rotation. no buckling noted. 2-3/4 DOE. Not getting good Sp02 doing activity.   Stairs             Wheelchair Mobility    Modified Rankin (Stroke Patients Only) Modified Rankin (Stroke Patients Only) Pre-Morbid Rankin Score: No significant disability Modified Rankin: Moderately severe disability     Balance Overall balance assessment: Needs assistance Sitting-balance support: Feet supported;No upper extremity supported Sitting balance-Leahy Scale: Fair Sitting balance - Comments: Close Min guard for safety due to pt feeling uneasy EOB.   Standing balance support: During functional activity Standing balance-Leahy Scale: Poor Standing balance comment: Reliant on UE support and external assist. Able to stand and perform weight shifts holdong onto back of chair for support, able mini marches. LLE locked out into extension but able to be in mid range with cues without buckling.  Cognition Arousal/Alertness: Awake/alert Behavior During Therapy: WFL for tasks assessed/performed Overall Cognitive Status: Within  Functional Limits for tasks assessed                                 General Comments: Motivated and eager to work with PT      Exercises      General Comments General comments (skin integrity, edema, etc.): Sp02 no reading, other VSS stable.      Pertinent Vitals/Pain Pain Assessment: No/denies pain    Home Living Family/patient expects to be discharged to:: Inpatient rehab Living Arrangements: Spouse/significant other                  Prior Function            PT Goals (current goals can now be found in the care plan section) Progress towards PT goals: Progressing toward goals    Frequency    Min 4X/week      PT Plan Current plan remains appropriate    Co-evaluation              AM-PAC PT "6 Clicks" Mobility   Outcome Measure  Help needed turning from your back to your side while in a flat bed without using bedrails?: A Lot Help needed moving from lying on your back to sitting on the side of a flat bed without using bedrails?: A Lot Help needed moving to and from a bed to a chair (including a wheelchair)?: A Lot Help needed standing up from a chair using your arms (e.g., wheelchair or bedside chair)?: A Lot Help needed to walk in hospital room?: A Lot Help needed climbing 3-5 steps with a railing? : Total 6 Click Score: 11    End of Session Equipment Utilized During Treatment: Gait belt Activity Tolerance: Patient tolerated treatment well Patient left: in chair;with call bell/phone within reach;with nursing/sitter in room Nurse Communication: Mobility status PT Visit Diagnosis: Muscle weakness (generalized) (M62.81);Hemiplegia and hemiparesis;Other abnormalities of gait and mobility (R26.89) Hemiplegia - Right/Left: Left Hemiplegia - dominant/non-dominant: Non-dominant Hemiplegia - caused by: Cerebral infarction     Time: [id number] PT Time Calculation (min) (ACUTE ONLY): 25 min  Charges:  $Gait Training: 8-22  mins $Therapeutic Activity: 8-22 mins                     Wray Kearns, PT, DPT Acute Rehabilitation Services Pager [phone] Office [place] 03/20/2019, 11:13 AM

## 2019-03-21 ENCOUNTER — Inpatient Hospital Stay (HOSPITAL_COMMUNITY): Payer: Medicare Other

## 2019-03-21 LAB — CBC
HCT: 37 % — ABNORMAL LOW (ref 39.0–52.0)
Hemoglobin: 12.7 g/dL — ABNORMAL LOW (ref 13.0–17.0)
MCH: 33.1 pg (ref 26.0–34.0)
MCHC: 34.3 g/dL (ref 30.0–36.0)
MCV: 96.4 fL (ref 80.0–100.0)
Platelets: 400 10*3/uL (ref 150–400)
RBC: 3.84 MIL/uL — ABNORMAL LOW (ref 4.22–5.81)
RDW: 13.3 % (ref 11.5–15.5)
WBC: 20.1 10*3/uL — ABNORMAL HIGH (ref 4.0–10.5)
nRBC: 0 % (ref 0.0–0.2)

## 2019-03-21 LAB — BASIC METABOLIC PANEL
Anion gap: 9 (ref 5–15)
BUN: 16 mg/dL (ref 8–23)
CO2: 26 mmol/L (ref 22–32)
Calcium: 8.5 mg/dL — ABNORMAL LOW (ref 8.9–10.3)
Chloride: 94 mmol/L — ABNORMAL LOW (ref 98–111)
Creatinine, Ser: 0.7 mg/dL (ref 0.61–1.24)
GFR calc Af Amer: >60 mL/min (ref >60)
GFR calc non Af Amer: >60 mL/min (ref >60)
Glucose, Bld: 118 mg/dL — ABNORMAL HIGH (ref 70–99)
Potassium: 4.1 mmol/L (ref 3.5–5.1)
Sodium: 129 mmol/L — ABNORMAL LOW (ref 135–145)

## 2019-03-21 LAB — GLUCOSE, CAPILLARY
Glucose-Capillary: 145 mg/dL — ABNORMAL HIGH (ref 70–99)
Glucose-Capillary: 116 mg/dL — ABNORMAL HIGH (ref 70–99)
Glucose-Capillary: 135 mg/dL — ABNORMAL HIGH (ref 70–99)
Glucose-Capillary: 104 mg/dL — ABNORMAL HIGH (ref 70–99)

## 2019-03-21 MED ORDER — MUPIROCIN CALCIUM 2 % EX CREA
TOPICAL_CREAM | Freq: Two times a day (BID) | CUTANEOUS | Status: DC
  Administered 2019-03-21: 1 via TOPICAL
  Administered 2019-03-22 – 2019-03-25 (×8): via TOPICAL
  Administered 2019-03-25: 1 via TOPICAL
  Administered 2019-03-26 – 2019-03-27 (×3): via TOPICAL
  Filled 2019-03-21 (×2): qty 15

## 2019-03-21 MED ORDER — POTASSIUM CHLORIDE CRYS ER 20 MEQ PO TBCR: 20.0000 meq | EXTENDED_RELEASE_TABLET | ORAL | Status: DC

## 2019-03-21 MED ORDER — APIXABAN 5 MG PO TABS
5.0000 mg | ORAL_TABLET | Freq: Two times a day (BID) | ORAL | Status: DC
  Administered 2019-03-22 – 2019-03-27 (×11): 5 mg via ORAL
  Filled 2019-03-21 (×11): qty 1

## 2019-03-21 MED ORDER — POTASSIUM CHLORIDE CRYS ER 20 MEQ PO TBCR
20.0000 meq | EXTENDED_RELEASE_TABLET | Freq: Every day | ORAL | Status: DC
  Administered 2019-03-21 – 2019-03-27 (×7): 20 meq via ORAL
  Filled 2019-03-21 (×7): qty 1

## 2019-03-21 NOTE — Progress Notes (Signed)
EVENING ROUNDS NOTE :     [address]       [place] [address]             [phone]                 11 Days Post-Op Procedure(s) (LRB): CORONARY ARTERY BYPASS GRAFTING (CABG)X 3 Using Left Internal Mammary Artery and Right Saphenous Vein Grafts (N/A) TRANSESOPHAGEAL ECHOCARDIOGRAM (TEE) (N/A)   Total Length of Stay:  LOS: 14 days  Events:  Doing well  Worked with physical therapy today    BP (!) 141/102   Pulse 60   Temp 98.4 F (36.9 C) (Oral)   Resp 18   Ht 5\' 8"  (1.727 m)   Wt 122.9 kg   SpO2 96%   BMI 41.20 kg/m         . sodium chloride      I/O last 3 completed shifts: In: 360 [P.O.:360] Out: 1450 [Urine:1450]   CBC Latest Ref Rng & Units 03/21/2019 03/20/2019 03/19/2019  WBC 4.0 - 10.5 K/uL 20.1(H) 15.4(H) 13.9(H)  Hemoglobin 13.0 - 17.0 g/dL 12.7(L) 11.8(L) 11.7(L)  Hematocrit 39.0 - 52.0 % 37.0(L) 35.0(L) 34.2(L)  Platelets 150 - 400 K/uL 400 308 282    BMP Latest Ref Rng & Units 03/21/2019 03/20/2019 03/18/2019  Glucose 70 - 99 mg/dL 118(H) 105(H) 108(H)  BUN 8 - 23 mg/dL 16 20 18   Creatinine 0.61 - 1.24 mg/dL 0.70 0.87 0.71  Sodium 135 - 145 mmol/L 129(L) 131(L) 131(L)  Potassium 3.5 - 5.1 mmol/L 4.1 3.6 3.5  Chloride 98 - 111 mmol/L 94(L) 94(L) 96(L)  CO2 22 - 32 mmol/L 26 26 25   Calcium 8.9 - 10.3 mg/dL 8.5(L) 8.4(L) 7.9(L)    ABG    Component Value Date/Time   PHART 7.478 (H) 03/16/2019 1239   PCO2ART 32.7 03/16/2019 1239   PO2ART 61.0 (L) 03/16/2019 1239   HCO3 24.2 03/16/2019 1239   TCO2 25 03/16/2019 1239   ACIDBASEDEF 3.0 (H) 03/10/2019 1833   O2SAT 62.5 03/18/2019 0430       Melodie Bouillon, MD 03/21/2019 4:43 PM

## 2019-03-21 NOTE — Progress Notes (Addendum)
[facility] DAILY ICU PROGRESS NOTE                   [address]            [place] [address]          [phone]   11 Days Post-Op Procedure(s) (LRB): CORONARY ARTERY BYPASS GRAFTING (CABG)X 3 Using Left Internal Mammary Artery and Right Saphenous Vein Grafts (N/A) TRANSESOPHAGEAL ECHOCARDIOGRAM (TEE) (N/A)  Total Length of Stay:  LOS: 14 days   Subjective: Patient without specific complaints this am  Objective: Vital signs in last 24 hours: Temp:  [96.5 F (35.8 C)-98.8 F (37.1 C)] 97.8 F (36.6 C) (10/13 0732) Pulse Rate:  [60-88] 83 (10/13 0700) Cardiac Rhythm: Atrial fibrillation (10/13 0400) Resp:  [14-24] 23 (10/13 0700) BP: (84-153)/(58-121) 153/121 (10/13 0600) SpO2:  [80 %-97 %] 95 % (10/13 0739) Weight:  [122.9 kg] 122.9 kg (10/13 0500)  Filed Weights   03/19/19 0430 03/20/19 0500 03/21/19 0500  Weight: 126.6 kg 123.4 kg 122.9 kg   Weight change: -0.5 kg       Intake/Output from previous day: 10/12 0701 - 10/13 0700 In: 240 [P.O.:240] Out: 550 [Urine:550]  Intake/Output this shift: No intake/output data recorded.  Current Meds: Scheduled Meds: . allopurinol  300 mg Oral q morning - 10a  . amiodarone  400 mg Oral BID  . aspirin EC  81 mg Oral Daily  . atorvastatin  80 mg Oral q1800  . bisacodyl  10 mg Rectal Daily   Or  . bisacodyl  10 mg Oral Daily  . Chlorhexidine Gluconate Cloth  6 each Topical Daily  . diltiazem  120 mg Oral Daily  . docusate sodium  200 mg Oral Daily  . doxycycline  100 mg Oral Q12H  . fluticasone  1 spray Each Nare Daily  . folic acid  1 mg Oral Daily  . furosemide  40 mg Intravenous Daily  . guaiFENesin  600 mg Oral BID  . insulin aspart  0-24 Units Subcutaneous TID AC & HS  . insulin detemir  10 Units Subcutaneous Daily  . metoprolol tartrate  50 mg Oral BID  . mometasone-formoterol  2 puff Inhalation BID  . pantoprazole  40 mg Oral Daily  . sodium chloride flush  10-40 mL Intracatheter Q12H  .  sodium chloride flush  3 mL Intravenous Q12H  . thiamine  100 mg Oral Daily  . vitamin B-12  100 mcg Oral Daily   Continuous Infusions: . sodium chloride     PRN Meds:.ALPRAZolam, ipratropium-albuterol, magnesium citrate, ondansetron (ZOFRAN) IV, oxyCODONE, sodium chloride flush, sodium chloride flush, traMADol  General appearance: alert, cooperative and no distress Neurologic: left sided weakness. He is able to move left arm and pick it up a little but not able to move left leg (LLE plegia) Heart:  IRRR IRRR Lungs: Clear Abdomen: Semi soft, obese, sporadic bowel sounds Extremities: Trace LE edema;able to move LLE more;good grip RUE Wounds: Minor erythema lower  lower sternal wound  Lab Results: CBC: Recent Labs    03/20/19 0436 03/21/19 0500  WBC 15.4* 20.1*  HGB 11.8* 12.7*  HCT 35.0* 37.0*  PLT 308 400   BMET:  Recent Labs    03/20/19 0436 03/21/19 0500  NA 131* 129*  K 3.6 4.1  CL 94* 94*  CO2 26 26  GLUCOSE 105* 118*  BUN 20 16  CREATININE 0.87 0.70  CALCIUM 8.4* 8.5*    CMET: Lab Results  Component Value Date   WBC 20.1 (H) 03/21/2019   HGB 12.7 (L) 03/21/2019   HCT 37.0 (L) 03/21/2019   PLT 400 03/21/2019   GLUCOSE 118 (H) 03/21/2019   CHOL 170 03/07/2019   TRIG 218 (H) 03/07/2019   HDL 41 03/07/2019   LDLCALC 85 03/07/2019   ALT 82 (H) 03/20/2019   AST 72 (H) 03/20/2019   NA 129 (L) 03/21/2019   K 4.1 03/21/2019   CL 94 (L) 03/21/2019   CREATININE 0.70 03/21/2019   BUN 16 03/21/2019   CO2 26 03/21/2019   TSH 10.690 (H) 03/12/2019   INR 1.4 (H) 03/10/2019   HGBA1C 5.6 03/09/2019    PT/INR: No results for input(s): LABPROT, INR in the last 72 hours. Radiology: No results found.   Assessment/Plan: S/P Procedure(s) (LRB): CORONARY ARTERY BYPASS GRAFTING (CABG)X 3 Using Left Internal Mammary Artery and Right Saphenous Vein Grafts (N/A) TRANSESOPHAGEAL ECHOCARDIOGRAM (TEE) (N/A)   1. CV-S/p NSTEMI. PAF. On Amiodarone 400 mg bid, Lopressor  50 mg bid, and Cardizem SR 120 mg daily 2. Pulmonary-on 2-3 liters of oxygen via [place]. Has OSA and uses CPAP at night. Encourage incentive spirometer. 3. Diastolic heart failure-on Lasix 40 mg IV daily 4. ABL anemia-H and H this am stable at 12.7 and 37 5. Neuro-repeat CT showed small right ACA thrombus. No seizure on EEG. On Apixaban, which is on hold until EPWs removed. 6. Hyponatremia-sodium this am 129. Likely related to diuresis 7. CBGs 100/131/145. On Insulin. Pre op HGA1C 5.6. He likely has pre diabetes. He will need further surveillance of his HGA1C with medical doctor after discharge. Will provide nutritional recommendations at discharge. 8. Urinary retention, bladder outlet obstruction-foley re inserted then removed on Sunday. Has needed In and out cath. UC showed no growth. 9. He would benefit from CIR, awaiting placement  Donielle Liston Alba PA-C 03/21/2019 7:47 AM    WBC now up to 20K, no fever- repeat cultures and start empiric iv antibiotics DC EPWs at noon today  then resume Eliquis  tomorrow am patient examined and medical record reviewed,agree with above note. Tharon Aquas Trigt III 03/21/2019

## 2019-03-21 NOTE — Progress Notes (Signed)
Physical Therapy Treatment Patient Details Name: Martin [name] MRN: [id number] DOB: 03-24-1956 Today's Date: 03/21/2019    History of Present Illness Pt is a 63 y/o male with PMH of  CAD, paroxysmal atrial fibrillation, OA, low back pain, polysubstance abuse, presents with chest pain and possible NSTEMI. S/p L heart cath and coronary angiography 9/30, CABG x 3 03/10/19.  Course complicated by CT revealing R distal ACA infarct 03/13/19. Pt with periods of lethargy; EEG WNL.    PT Comments    Patient progressing well towards PT goals. Continues to require Max A for bed mobility and cues to adhere to sternal precautions. Tolerated standing multiple times with Mod A progressing to Min A of 2 with hands on knees. Improved ambulation distance and tolerance today requiring Mod A for balance, weight shifting and LLE progression. Pt with tendency for left lateral lean and fatigues. 2 seated rest breaks due to 2-3/4 DOE. Highly motivated to get to rehab. Noted to have some cognitive deficits relating to memory/precautions/problem solving. Difficult getting an Sp02 reading during activity due to cold fingers, read ~85% on 4L/min 02. Will continue to follow.   Follow Up Recommendations  CIR;Supervision/Assistance - 24 hour     Equipment Recommendations  Other (comment)(defer to CIR)    Recommendations for Other Services       Precautions / Restrictions Precautions Precautions: Fall;Sternal Precaution Booklet Issued: No Precaution Comments: Lft hemiparesis Restrictions Weight Bearing Restrictions: Yes Other Position/Activity Restrictions: sternal precautions     Mobility  Bed Mobility Overal bed mobility: Needs Assistance Bed Mobility: Supine to Sit     Supine to sit: Max assist;HOB elevated   Sit to sidelying: +2 for physical assistance;Max assist General bed mobility comments: Max A for LLE management, scooting bottom and trunk elevation. Assist to bring BLEs into bed and to lower  trunk.  Transfers Overall transfer level: Needs assistance Equipment used: Rolling walker (2 wheeled) Transfers: Sit to/from Stand Sit to Stand: Mod assist;Min assist;+2 physical assistance         General transfer comment: Stood from EOB x1 with Mod A of 2 with hands on knees, posterior lean and left lateral lean initially. Stood from chair x2 progressing to Min A of 2. SPT chair to bed with assist for LLE progression, weight shifting and RW management.  Ambulation/Gait Ambulation/Gait assistance: Mod assist;+2 safety/equipment Gait Distance (Feet): 14 Feet(+ 12' + 10') Assistive device: Rolling walker (2 wheeled) Gait Pattern/deviations: Step-to pattern;Step-through pattern;Decreased stance time - left;Decreased step length - right;Trunk flexed;Wide base of support Gait velocity: decreased   General Gait Details: Slow, unsteady gait with difficulty progressing LLE, Left knee locked out into extension during stance in recurvatum, assist for weight shifting. LLE positioned into left hip external rotation. no buckling noted. 2-3/4 DOE. Not getting good Sp02 doing activity.2-3/4 DOE.   Stairs             Wheelchair Mobility    Modified Rankin (Stroke Patients Only) Modified Rankin (Stroke Patients Only) Pre-Morbid Rankin Score: No significant disability Modified Rankin: Moderately severe disability     Balance Overall balance assessment: Needs assistance Sitting-balance support: Feet supported;No upper extremity supported Sitting balance-Leahy Scale: Fair Sitting balance - Comments: left lateral lean but able to self correct to midline with cues. Postural control: Left lateral lean Standing balance support: During functional activity Standing balance-Leahy Scale: Poor Standing balance comment: Reliant on BUEs for support and external assist for dynamic standing.  LLE locked out into extension for stability at times.  Cognition  Arousal/Alertness: Awake/alert Behavior During Therapy: WFL for tasks assessed/performed Overall Cognitive Status: Impaired/Different from baseline Area of Impairment: Memory                     Memory: Decreased short-term memory;Decreased recall of precautions       Problem Solving: Slow processing;Requires verbal cues General Comments: Pt seems to have memory deficits- needs repetition to follow precautions within session; does not recall therapists session yesterday and details of rehab coordinator meeting.      Exercises      General Comments General comments (skin integrity, edema, etc.): Sp02 towards end of session reading 85% on 4L/min 02, not able to get a good reading during gait training. BP pre activity 142/86, post activity 167/102      Pertinent Vitals/Pain Pain Assessment: No/denies pain    Home Living                      Prior Function            PT Goals (current goals can now be found in the care plan section) Progress towards PT goals: Progressing toward goals    Frequency    Min 4X/week      PT Plan Current plan remains appropriate    Co-evaluation              AM-PAC PT "6 Clicks" Mobility   Outcome Measure  Help needed turning from your back to your side while in a flat bed without using bedrails?: A Lot Help needed moving from lying on your back to sitting on the side of a flat bed without using bedrails?: Total Help needed moving to and from a bed to a chair (including a wheelchair)?: A Lot Help needed standing up from a chair using your arms (e.g., wheelchair or bedside chair)?: A Lot Help needed to walk in hospital room?: A Lot Help needed climbing 3-5 steps with a railing? : Total 6 Click Score: 10    End of Session Equipment Utilized During Treatment: Gait belt Activity Tolerance: Patient limited by fatigue;Patient tolerated treatment well Patient left: in bed;with call bell/phone within reach;with bed  alarm set;with family/visitor present Nurse Communication: Mobility status PT Visit Diagnosis: Muscle weakness (generalized) (M62.81);Hemiplegia and hemiparesis;Other abnormalities of gait and mobility (R26.89) Hemiplegia - Right/Left: Left Hemiplegia - dominant/non-dominant: Non-dominant Hemiplegia - caused by: Cerebral infarction     Time: [id number] PT Time Calculation (min) (ACUTE ONLY): 39 min  Charges:  $Gait Training: 23-37 mins $Therapeutic Activity: 8-22 mins                     Wray Kearns, PT, DPT Acute Rehabilitation Services Pager [phone] Office [place] 03/21/2019, 3:41 PM

## 2019-03-21 NOTE — Progress Notes (Signed)
Inpatient Rehabilitation-Admissions Coordinator   Received notification from [employer] company stating that this case has been withdrawn as the patient has become "medically unstable" for CIR. Because this case has been withdrawn and not denied it will allow [place] to resubmit his case once ready. When asked further about his medical instability, the insurance company states it is mostly referring to the leukocytosis and urinary retention, among other things. AC will notify the member and continue to follow to see if current medical issues resolve. Hopeful to re-submit for insurance authorization in the near future.   Please call if questions.  Jhonnie Garner, OTR/L  Rehab Admissions Coordinator  [phone] 03/21/2019 3:05 PM

## 2019-03-21 NOTE — Progress Notes (Addendum)
Progress Note  Patient Name: Martin [name] Date of Encounter: 03/21/2019  Primary Cardiologist: Kate Sable, MD   Subjective  O/N events: Urinary retention  Martin [name] was examined and evaluated at bedside this AM.  He was observed eating breakfast comfortably in bedside chair. He mentions that his nurses had to straight I&o cath him multiple times after his foley was removed to help with urination. Otherwise, he denies any chest pain, palpitations, dyspnea. .  Inpatient Medications    Scheduled Meds: . allopurinol  300 mg Oral q morning - 10a  . amiodarone  400 mg Oral BID  . aspirin EC  81 mg Oral Daily  . atorvastatin  80 mg Oral q1800  . bisacodyl  10 mg Rectal Daily   Or  . bisacodyl  10 mg Oral Daily  . Chlorhexidine Gluconate Cloth  6 each Topical Daily  . diltiazem  120 mg Oral Daily  . docusate sodium  200 mg Oral Daily  . doxycycline  100 mg Oral Q12H  . fluticasone  1 spray Each Nare Daily  . folic acid  1 mg Oral Daily  . furosemide  40 mg Intravenous Daily  . guaiFENesin  600 mg Oral BID  . insulin aspart  0-24 Units Subcutaneous TID AC & HS  . insulin detemir  10 Units Subcutaneous Daily  . metoprolol tartrate  50 mg Oral BID  . mometasone-formoterol  2 puff Inhalation BID  . pantoprazole  40 mg Oral Daily  . sodium chloride flush  10-40 mL Intracatheter Q12H  . sodium chloride flush  3 mL Intravenous Q12H  . thiamine  100 mg Oral Daily  . vitamin B-12  100 mcg Oral Daily   Continuous Infusions: . sodium chloride     PRN Meds: ALPRAZolam, ipratropium-albuterol, magnesium citrate, ondansetron (ZOFRAN) IV, oxyCODONE, sodium chloride flush, sodium chloride flush, traMADol   Vital Signs    Vitals:   03/21/19 0400 03/21/19 0500 03/21/19 0600 03/21/19 0700  BP: 108/76 137/78 (!) 153/121   Pulse: 71 60 74 83  Resp: (!) 22 19 19  (!) 23  Temp:      TempSrc:      SpO2: 92% 97% 90% 95%  Weight:  122.9 kg    Height:        Intake/Output  Summary (Last 24 hours) at 03/21/2019 0716 Last data filed at 03/20/2019 2140 Gross per 24 hour  Intake 240 ml  Output 550 ml  Net -310 ml   Filed Weights   03/19/19 0430 03/20/19 0500 03/21/19 0500  Weight: 126.6 kg 123.4 kg 122.9 kg    Telemetry    A.fib rate controlled HR ~70s - Personally Reviewed  ECG    None from this am - Personally Reviewed  Physical Exam   Gen: Well-developed, obese, NAD HEENT: NCAT head, hearing intact, EOMI, Neck: supple, ROM intact, no JVD CV: Irregularly irregular, S1, S2 normal, No rubs, no murmurs, surgical scar intact w/o significant swelling, erythema or drainage Pulm: CTAB no rales, no wheezes Abd: Distended, soft, pacer wires still in place Extm: Peripheral pulses intact, no pitting edema Skin: Dry, Warm, normal turgor  Neuro: AAOx3, Slightly improved 2/5 strength on LLE  Labs    Chemistry Recent Labs  Lab 03/17/19 0408 03/18/19 0426 03/20/19 0436 03/21/19 0500  NA 128* 131* 131* 129*  K 3.7 3.5 3.6 4.1  CL 90* 96* 94* 94*  CO2 25 25 26 26   GLUCOSE 144* 108* 105* 118*  BUN 21 18 20  16  CREATININE 0.86 0.71 0.87 0.70  CALCIUM 8.2* 7.9* 8.4* 8.5*  PROT 5.4* 5.2* 5.6*  --   ALBUMIN 2.2* 2.2* 2.5*  --   AST 50* 95* 72*  --   ALT 40 79* 82*  --   ALKPHOS 155* 130* 119  --   BILITOT 1.2 1.1 1.5*  --   GFRNONAA >60 >60 >60 >60  GFRAA >60 >60 >60 >60  ANIONGAP 13 10 11 9      Hematology Recent Labs  Lab 03/19/19 0429 03/20/19 0436 03/21/19 0500  WBC 13.9* 15.4* 20.1*  RBC 3.50* 3.58* 3.84*  HGB 11.7* 11.8* 12.7*  HCT 34.2* 35.0* 37.0*  MCV 97.7 97.8 96.4  MCH 33.4 33.0 33.1  MCHC 34.2 33.7 34.3  RDW 13.6 13.4 13.3  PLT 282 308 400    Cardiac EnzymesNo results for input(s): TROPONINI in the last 168 hours. No results for input(s): TROPIPOC in the last 168 hours.   BNP No results for input(s): BNP, PROBNP in the last 168 hours.   DDimer No results for input(s): DDIMER in the last 168 hours.   Radiology     Dg Chest 2 View  Result Date: 03/20/2019 CLINICAL DATA:  Post CABG EXAM: CHEST - 2 VIEW COMPARISON:  Radiograph 03/18/2019 FINDINGS: Right PICC terminates at the superior cavoatrial junction. Postsurgical changes related to prior CABG including intact and aligned sternotomy wires and multiple surgical clips projecting over the mediastinum. Stable postoperative mediastinal widening. Bandlike areas of opacity likely reflect atelectatic change. Suspect at least small bilateral effusions. Nodular density in the left mid lung may reflect a hematoma at the site of prior chest tube placement. No acute osseous or soft tissue abnormality. IMPRESSION: Stable appearance of bilateral effusions and basilar atelectasis. Stable nodular density in the left mid lung, possibly hematoma at site of prior chest tube. Electronically Signed   By: Lovena Le M.D.   On: 03/20/2019 05:59    Cardiac Studies   Echo 03/08/19: IMPRESSIONS  1. Left ventricular ejection fraction, by visual estimation, is 55 to 60%. The left ventricle has normal function. Normal left ventricular size. There is mildly increased left ventricular hypertrophy. Basal inferior akinesis. 2. Left ventricular diastolic Doppler parameters are consistent with impaired relaxation pattern of LV diastolic filling. 3. Global right ventricle has normal systolic function.The right ventricular size is normal. No increase in right ventricular wall thickness. 4. The aortic valve is tricuspid Aortic valve regurgitation is trivial by color flow Doppler. Mild aortic valve stenosis. Mean gradient 10 mmHg. 5. There is mild dilatation of the ascending aorta measuring 42 mm. 6. Left atrial size was moderately dilated. 7. Right atrial size was mildly dilated. 8. The tricuspid valve is normal in structure. Tricuspid valve regurgitation was not visualized by color flow Doppler. 9. The mitral valve is normal in structure. No evidence of mitral valve regurgitation.  No evidence of mitral stenosis. 10. TR signal is inadequate for assessing pulmonary artery systolic pressure. 11. The inferior vena cava is normal in size with greater than 50% respiratory variability, suggesting right atrial pressure of 3 mmHg.  Carotid Dopplers 03/09/2019: 1 to 39% ICA stenosis bilaterally.  Left heart cath 03/08/2019:  Severe three-vessel coronary calcification  Patent left main  Eccentric ostial 50 to 75% LAD with mid vessel 40 to 50% narrowing. Mid to distal LAD stent is widely patent.  Ramus intermedius contains proximal 90% stenosis.  Circumflex contains 99% proximal/ostial stenosis.  RCA is heavily calcified and stented. The artery is totally occluded in  the proximal to mid segment. Left-to-right collaterals are noted.  Mildly reduced LV systolic function with EF estimated to be 40%. LVEDP is 28 mmHg. Findings compatible with acute on chronic combined   Patient Profile     Mr.Buhrman is a 63 yo M w/ PMH of prior CAD s/p pCI, PAF, HTN, OSA on CPAP presented with chest pain, found to have NSTEMI w/ multivessel dz requiring CABG. Hospital course complicated by acute ACA stroke.  Assessment & Plan    Leukocytosis Wbc 13.9->15.4->20.1 Continuing to trend up. Afebrile. Vitals stable. No obvious source of infection at the moment. Urine culture NGTD. Chest X-ray w/o lobar consolidation - F/u cultures  Acute ACA stroke Followed by neurology. PT/OT recommend CIR. LLE still w/ significant weakness. Speech cleared for oral meds - C/w Eliquis - CIR per PT/OT  NSTEMI Multi-vessel CAD Cath with multi-vessel dz. S/p CABG post op day 11. No significant chest pain per pt. Post-op Echo w/ no significant different.. - C/w asa, atorvastatin - Start DAPT when cleared by CT surgery.  Diastolic heart failure TEE post Cabg w/ preserved EF. Urine output 550 yesterday Weight still trending down 123.4->122.9 Last known dry weight (122kg). Creatinine this am 0.70 - C/w  furosemide 40 daily - Daily weights, strict I&Os - Trend/replete K, Mag as appropriate   PAF Developed A.fib w/ RVR couple days ago. On amio, dilt metop. Still irregular but not in RVR. Heart rate bordering on bradycardia after increase in metoprolol dose @ 60-70. May be able to come off diltiazem - Need pacer wires removed - D/c diltiazem - C/w metoprolol 50mg  BID - C/w oral amio 400mg  BID - C/w Eliquis  Urinary retention Low urine output after foley out. Per nursing, bladder scan showing ~300cc regularly. Likely due to med side effects such as his oxycodone. Possibly has component of underlying BPH.  - Reduce pain meds as much as possible  For questions or updates, please contact [place] Please consult [email or web address] for contact info under Cardiology/STEMI.   Signed, Gilberto Better, MD PGY-2, [facility] Pager: [phone] 03/21/2019, 7:16 AM    Attending attestation to follow  I have seen and examined the patient along with Gilberto Better, MD.  I have reviewed the chart, notes and new data.  I agree with his note.  Key new complaints: no CV complaints Key examination changes: irregular rhythm, mild dependent edema, LLE paresis. Key new findings / data: good rate control on telemetry  PLAN: Stop diltiazem. Keep on amio and metoprolol. Resume apixaban after removal of TPW. Will follow in CIR.  Sanda Klein, MD, [place] [phone] 03/21/2019, 8:49 AM

## 2019-03-21 NOTE — Progress Notes (Signed)
Inpatient Rehabilitation-Admissions Coordinator   Pt's insurance is requesting Peer to Peer discussion with treating service. AC has contacted Lars Pinks, PA who has agreed to complete peer to peer discussion. Will follow up once there has been a final determination from insurance regarding CIR.   Please call if questions.   Jhonnie Garner, OTR/L  Rehab Admissions Coordinator  [phone] 03/21/2019 12:43 PM

## 2019-03-22 ENCOUNTER — Inpatient Hospital Stay (HOSPITAL_COMMUNITY): Payer: Medicare Other

## 2019-03-22 LAB — CBC
HCT: 34.6 % — ABNORMAL LOW (ref 39.0–52.0)
Hemoglobin: 11.4 g/dL — ABNORMAL LOW (ref 13.0–17.0)
MCH: 32.9 pg (ref 26.0–34.0)
MCHC: 32.9 g/dL (ref 30.0–36.0)
MCV: 99.7 fL (ref 80.0–100.0)
Platelets: 351 10*3/uL (ref 150–400)
RBC: 3.47 MIL/uL — ABNORMAL LOW (ref 4.22–5.81)
RDW: 13.3 % (ref 11.5–15.5)
WBC: 17.6 10*3/uL — ABNORMAL HIGH (ref 4.0–10.5)
nRBC: 0 % (ref 0.0–0.2)

## 2019-03-22 LAB — GLUCOSE, CAPILLARY
Glucose-Capillary: 108 mg/dL — ABNORMAL HIGH (ref 70–99)
Glucose-Capillary: 172 mg/dL — ABNORMAL HIGH (ref 70–99)
Glucose-Capillary: 104 mg/dL — ABNORMAL HIGH (ref 70–99)
Glucose-Capillary: 103 mg/dL — ABNORMAL HIGH (ref 70–99)

## 2019-03-22 LAB — URINE CULTURE
Culture: NO GROWTH
Special Requests: NORMAL

## 2019-03-22 MED ORDER — CITALOPRAM HYDROBROMIDE 20 MG PO TABS
10.0000 mg | ORAL_TABLET | Freq: Every day | ORAL | Status: DC
  Administered 2019-03-22 – 2019-03-27 (×6): 10 mg via ORAL
  Filled 2019-03-22 (×6): qty 1

## 2019-03-22 MED ORDER — AMIODARONE HCL 200 MG PO TABS
400.0000 mg | ORAL_TABLET | Freq: Every day | ORAL | Status: DC
  Administered 2019-03-22: 400 mg via ORAL
  Filled 2019-03-22: qty 2

## 2019-03-22 MED ORDER — TRAZODONE HCL 50 MG PO TABS
50.0000 mg | ORAL_TABLET | Freq: Every day | ORAL | Status: DC
  Administered 2019-03-22 – 2019-03-26 (×5): 50 mg via ORAL
  Filled 2019-03-22 (×5): qty 1

## 2019-03-22 NOTE — Progress Notes (Signed)
Physical Therapy Treatment Patient Details Name: Martin [name] MRN: [id number] DOB: 1956-04-05 Today's Date: 03/22/2019    History of Present Illness Pt is a 63 y/o male with PMH of  CAD, paroxysmal atrial fibrillation, OA, low back pain, polysubstance abuse, presents with chest pain and possible NSTEMI. S/p L heart cath and coronary angiography 9/30, CABG x 3 03/10/19.  Course complicated by CT revealing R distal ACA infarct 03/13/19. Pt with periods of lethargy; EEG WNL.    PT Comments    Patient seen for mobility progression. Pt is making progress toward PT goals. Pt continues to present with L side weakness. Pt requires max A for bed mobility given sternal precautions, min A +2 for sit to stand transfers, and mod A +2 for short distance gait training. Current plan remains appropriate.     Follow Up Recommendations  CIR;Supervision/Assistance - 24 hour     Equipment Recommendations  Other (comment)(defer to CIR)    Recommendations for Other Services       Precautions / Restrictions Precautions Precautions: Fall;Sternal Precaution Booklet Issued: No Precaution Comments: Lft hemiparesis Restrictions Weight Bearing Restrictions: Yes Other Position/Activity Restrictions: sternal precautions     Mobility  Bed Mobility Overal bed mobility: Needs Assistance Bed Mobility: Supine to Sit     Supine to sit: Max assist;HOB elevated     General bed mobility comments: attempted log rolling, but limited by processing and motor planning; transitioned to EOB with max assist for trunk support and scooting hips as able to manage LEs to EOB  Transfers Overall transfer level: Needs assistance Equipment used: Rolling walker (2 wheeled) Transfers: Sit to/from Stand Sit to Stand: Min assist;+2 physical assistance;+2 safety/equipment         General transfer comment: cueing for hand placement and technique, able to transition from EOB and recliner with min assist +2 to power up and  steady  Ambulation/Gait Ambulation/Gait assistance: Mod assist;+2 safety/equipment Gait Distance (Feet): 18 Feet Assistive device: Rolling walker (2 wheeled) Gait Pattern/deviations: Step-to pattern;Decreased step length - left;Decreased step length - right;Decreased stance time - left Gait velocity: decreased   General Gait Details: cues for sequencing, weight shifting, proximtiy to RW, and increased L step length; assistance for weight shifting, guiding RW, and balance   Stairs             Wheelchair Mobility    Modified Rankin (Stroke Patients Only)       Balance Overall balance assessment: Needs assistance Sitting-balance support: Feet supported;No upper extremity supported Sitting balance-Leahy Scale: Fair Sitting balance - Comments: left lateral lean but able to self correct to midline with cues. Postural control: Left lateral lean Standing balance support: Bilateral upper extremity supported;No upper extremity supported;During functional activity Standing balance-Leahy Scale: Poor Standing balance comment: worked on standing balance/weight shifting to midline; multimodal cues for L quad activation; pt able to stand statically without UE support, but fatigues easily and presents with increased L lateral lean; relaint on BUE support dynamically                            Cognition Arousal/Alertness: Awake/alert Behavior During Therapy: WFL for tasks assessed/performed Overall Cognitive Status: Impaired/Different from baseline Area of Impairment: Memory;Attention;Safety/judgement;Awareness;Problem solving;Following commands                   Current Attention Level: Sustained Memory: Decreased short-term memory;Decreased recall of precautions Following Commands: Follows one step commands consistently;Follows multi-step commands inconsistently;Follows one step  commands with increased time Safety/Judgement: Decreased awareness of safety;Decreased  awareness of deficits Awareness: Emergent Problem Solving: Slow processing;Difficulty sequencing;Requires verbal cues;Requires tactile cues General Comments: pt with improving awareness to deficits, requires multimodal cueing functionally and demonstrates difficulty sequencing/problem solving      Exercises      General Comments General comments (skin integrity, edema, etc.): SpO2 desat to 88% on 4L O2 via [place] with mobility (poor waveform so not sure of accuracy)      Pertinent Vitals/Pain Pain Assessment: Faces Faces Pain Scale: No hurt    Home Living                      Prior Function            PT Goals (current goals can now be found in the care plan section) Acute Rehab PT Goals Patient Stated Goal: "Get home to my grandkids" Progress towards PT goals: Progressing toward goals    Frequency    Min 4X/week      PT Plan Current plan remains appropriate    Co-evaluation PT/OT/SLP Co-Evaluation/Treatment: Yes Reason for Co-Treatment: Complexity of the patient's impairments (multi-system involvement);For patient/therapist safety;To address functional/ADL transfers PT goals addressed during session: Mobility/safety with mobility;Balance OT goals addressed during session: ADL's and self-care      AM-PAC PT "6 Clicks" Mobility   Outcome Measure  Help needed turning from your back to your side while in a flat bed without using bedrails?: A Lot Help needed moving from lying on your back to sitting on the side of a flat bed without using bedrails?: A Lot Help needed moving to and from a bed to a chair (including a wheelchair)?: A Lot Help needed standing up from a chair using your arms (e.g., wheelchair or bedside chair)?: A Little Help needed to walk in hospital room?: A Lot Help needed climbing 3-5 steps with a railing? : Total 6 Click Score: 12    End of Session Equipment Utilized During Treatment: Gait belt Activity Tolerance: Patient tolerated  treatment well Patient left: with call bell/phone within reach;in chair;with chair alarm set Nurse Communication: Mobility status PT Visit Diagnosis: Muscle weakness (generalized) (M62.81);Hemiplegia and hemiparesis;Other abnormalities of gait and mobility (R26.89) Hemiplegia - Right/Left: Left Hemiplegia - dominant/non-dominant: Non-dominant Hemiplegia - caused by: Cerebral infarction     Time: 0813-0859 PT Time Calculation (min) (ACUTE ONLY): 46 min  Charges:  $Gait Training: 8-22 mins                     Earney Navy, PTA Acute Rehabilitation Services Pager: [phone] Office: [phone]     Darliss Cheney 03/22/2019, 12:42 PM

## 2019-03-22 NOTE — Progress Notes (Addendum)
Mid sternal incision cleaned with betadine and covered with gauze.

## 2019-03-22 NOTE — Code Documentation (Signed)
Code Stroke called at 1615.   Patient s/p CABG - post op had RT subcortical infarct causing LT sided weakness. Per nurse, patient fell asleep this afternoon and woke up confused and disoriented. RN called the CVTS service and was instructed to initiate a Code Stroke. AxO - 4, follows commands, NIH 2 - LT Arm and Leg - drift but this is not new. Per nurse, this is baseline for the patient since the stroke. BS and VS are normal. Taken for STAT CT HEAD - scan was negative - Code Stroke was cancelled.  Plan:  -- Monitor VS/Neuro checks as ordered -- Delirium Precautions.   Start Time 1615 End Time [id number]

## 2019-03-22 NOTE — Progress Notes (Addendum)
STROKE TEAM PROGRESS NOTE   SUBJECTIVE (INTERVAL HISTORY) Stroke team was called to reevaluate this patient when a code stroke was called by patient's RN for confusion and disorientation when he woke up from sleep.  Patient's wife is at the bedside who is able to complement his history.  Patient apparently got a tablet of Xanax earlier this morning.  He went to sleep and upon awakening was disoriented and confused.  There was no noted slurred speech, facial weakness, increase extremity weakness. . According to the patient wife patient has not been sleeping well at night.  He has also had some intermittent confusion.  He is on eliquis for atrial fibrillation OBJECTIVE Temp:  [97.5 F (36.4 C)-98.1 F (36.7 C)] 97.5 F (36.4 C) (10/14 1112) Pulse Rate:  [59-80] 59 (10/14 1112) Cardiac Rhythm: Atrial fibrillation (10/14 1436) Resp:  [17-20] 20 (10/14 1112) BP: (107-171)/(75-99) 107/81 (10/14 1112) SpO2:  [90 %-98 %] 97 % (10/14 1112) Weight:  [124.9 kg] 124.9 kg (10/14 0344)  Recent Labs  Lab 03/21/19 1633 03/21/19 2124 03/22/19 0635 03/22/19 1109 03/22/19 1621  GLUCAP 135* 104* 108* 172* 104*   Recent Labs  Lab 03/16/19 0431 03/16/19 1239 03/17/19 0408 03/18/19 0426 03/20/19 0436 03/21/19 0500  NA 127* 125* 128* 131* 131* 129*  K 3.2* 3.9 3.7 3.5 3.6 4.1  CL 90*  --  90* 96* 94* 94*  CO2 24  --  25 25 26 26   GLUCOSE 154*  --  144* 108* 105* 118*  BUN 25*  --  21 18 20 16   CREATININE 0.82  --  0.86 0.71 0.87 0.70  CALCIUM 8.1*  --  8.2* 7.9* 8.4* 8.5*  MG 1.9  --  1.8  --   --   --    Recent Labs  Lab 03/16/19 0431 03/17/19 0408 03/18/19 0426 03/20/19 0436  AST 29 50* 95* 72*  ALT 29 40 79* 82*  ALKPHOS 160* 155* 130* 119  BILITOT 1.2 1.2 1.1 1.5*  PROT 5.4* 5.4* 5.2* 5.6*  ALBUMIN 2.1* 2.2* 2.2* 2.5*   Recent Labs  Lab 03/18/19 0426 03/19/19 0429 03/20/19 0436 03/21/19 0500 03/22/19 0425  WBC 11.7* 13.9* 15.4* 20.1* 17.6*  HGB 11.6* 11.7* 11.8* 12.7*  11.4*  HCT 33.7* 34.2* 35.0* 37.0* 34.6*  MCV 96.8 97.7 97.8 96.4 99.7  PLT 260 282 308 400 351   No results for input(s): CKTOTAL, CKMB, CKMBINDEX, TROPONINI in the last 168 hours. No results for input(s): LABPROT, INR in the last 72 hours. No results for input(s): COLORURINE, LABSPEC, Hypoluxo, GLUCOSEU, HGBUR, BILIRUBINUR, KETONESUR, PROTEINUR, UROBILINOGEN, NITRITE, LEUKOCYTESUR in the last 72 hours.  Invalid input(s): APPERANCEUR     Component Value Date/Time   CHOL 170 03/07/2019 2358   TRIG 218 (H) 03/07/2019 2358   HDL 41 03/07/2019 2358   CHOLHDL 4.1 03/07/2019 2358   VLDL 44 (H) 03/07/2019 2358   LDLCALC 85 03/07/2019 2358   Lab Results  Component Value Date   HGBA1C 5.6 03/09/2019      Component Value Date/Time   LABOPIA NONE DETECTED 05/20/2010 1326   COCAINSCRNUR NONE DETECTED 05/20/2010 1326   LABBENZ POSITIVE (A) 05/20/2010 1326   AMPHETMU NONE DETECTED 05/20/2010 1326   THCU NONE DETECTED 05/20/2010 1326   LABBARB  05/20/2010 1326    NONE DETECTED        DRUG SCREEN FOR MEDICAL PURPOSES ONLY.  IF CONFIRMATION IS NEEDED FOR ANY PURPOSE, NOTIFY LAB WITHIN 5 DAYS.  LOWEST DETECTABLE LIMITS FOR URINE DRUG SCREEN Drug Class       Cutoff (ng/mL) Amphetamine      1000 Barbiturate      200 Benzodiazepine   [id number] Tricyclics       [id number] Opiates          300 Cocaine          300 THC              50    No results for input(s): ETH in the last 168 hours.  I have personally reviewed the radiological images below and agree with the radiology interpretations.  Ct Angio Head W Or Wo Contrast  Result Date: 03/14/2019 CLINICAL DATA:  Anterior cerebral artery territory infarct follow-up EXAM: CT ANGIOGRAPHY HEAD AND NECK TECHNIQUE: Multidetector CT imaging of the head and neck was performed using the standard protocol during bolus administration of intravenous contrast. Multiplanar CT image reconstructions and MIPs were obtained to evaluate the vascular anatomy.  Carotid stenosis measurements (when applicable) are obtained utilizing NASCET criteria, using the distal internal carotid diameter as the denominator. CONTRAST:  50mL OMNIPAQUE IOHEXOL 350 MG/ML SOLN COMPARISON:  CTA head neck 10/23/2017 Head CT 03/13/2019 FINDINGS: CTA NECK FINDINGS SKELETON: There is no bony spinal canal stenosis. No lytic or blastic lesion. OTHER NECK: Status post recent coronary artery bypass graft with median sternotomy. There is a retrosternal fluid collection that measures 4.3 x 2.0 x 5.2 cm. UPPER CHEST: Bilateral pleural effusions. There is a large loculated component of the left effusion. AORTIC ARCH: There is moderate calcific atherosclerosis of the aortic arch. There is no aneurysm, dissection or hemodynamically significant stenosis of the visualized portion of the aorta. Normal variant aortic arch branching pattern with the brachiocephalic and left common carotid arteries sharing a common origin. The visualized proximal subclavian arteries are widely patent. RIGHT CAROTID SYSTEM: There is mild narrowing of the proximal common carotid artery due to noncalcified plaque. There is circumferential atherosclerotic calcification at the carotid bifurcation and proximal internal carotid artery resulting in less than 50% stenosis. The distal internal carotid artery is widely patent. Proximal external carotid artery is normal. LEFT CAROTID SYSTEM: Mild common carotid atherosclerosis. Circumferential calcification of the proximal ICA without hemodynamically significant stenosis. Normal distal ICA aside from minor calcification. There is mild narrowing of the origin of the external carotid artery due to noncalcified plaque. VERTEBRAL ARTERIES: Right dominant configuration. Poor visualization of the right origin. Left origin is patent. There is no dissection, occlusion or flow-limiting stenosis to the skull base (V1-V3 segments). CTA HEAD FINDINGS POSTERIOR CIRCULATION: --Vertebral arteries: Normal  right V4 segment. There is loss of opacification of the left V4 segment distal to the PICA origin. --Posterior inferior cerebellar arteries (PICA): Patent origins from the vertebral arteries. --Anterior inferior cerebellar arteries (AICA): Right dominant --Basilar artery: Diminutive --Superior cerebellar arteries: Normal. --Posterior cerebral arteries: Normal. Both are predominantly supplied by the posterior communicating arteries (p-comm). ANTERIOR CIRCULATION: --Intracranial internal carotid arteries: Atherosclerotic calcification of the internal carotid arteries at the skull base without hemodynamically significant stenosis. --Anterior cerebral arteries (ACA): There is a new focus of calcification in the distal right A2 segment (series 12, image 22) that may indicate partially calcified thrombus. However, the remainder of the vessel is normally opacified. Calcification in the left A2 segment is unchanged. Both A1 segments are present. Patent anterior communicating artery (a-comm). --Middle cerebral arteries (MCA): Normal. VENOUS SINUSES: As permitted by contrast timing, patent. ANATOMIC VARIANTS: Bilateral fetal origins of the posterior cerebral  arteries. Review of the MIP images confirms the above findings. IMPRESSION: 1. No emergent large vessel occlusion or hemodynamically significant stenosis. 2. New focus of calcification in the right anterior cerebral artery distal A2 segment (s12:i22), which may indicate partially calcified thrombus. The vessel is patent distal to this, however. 3. Decreased opacification of the distal V4 segment of the left vertebral artery, distal to the PICA origin, age-indeterminate. Bilateral fetal origins of the posterior cerebral arteries with diminutive vertebrobasilar system. 4. Status post recent coronary artery bypass graft with retrosternal fluid collection that measures 4.3 x 2.0 x 5.2 cm. In the immediate postoperative setting, this is favored to be a seroma. 5. Bilateral  pleural effusions with large loculated component of the left effusion. 6. Aortic Atherosclerosis (ICD10-I70.0). Electronically Signed   By: Ulyses Jarred M.D.   On: 03/14/2019 19:36   Dg Chest 1 View  Result Date: 03/10/2019 CLINICAL DATA:  Status post CABG, rule out pneumo. EXAM: CHEST  1 VIEW COMPARISON:  03/07/2019 FINDINGS: Now status post median sternotomy and CABG. Swan-Ganz catheter in proximal right pulmonary artery. A transesophageal echocardiographic pro passes to the level of the T10 vertebral body. Seven intact sternal wires are noted. Left-sided chest tube in place without signs of pneumothorax on supine radiograph. Cardiomediastinal contours with mild widening superiorly likely secondary to postoperative change otherwise unchanged accounting for low lung volumes. There is a single tube in the midline projecting over sternal wires extending to the level of the sternal manubrium. This may represent mediastinal drain. IMPRESSION: 1. Status post CABG with left chest tube without visible pneumothorax. 2. Swan-Ganz catheter and transesophageal echocardiographic probe in place. 3. Midline drainage tubing projects over the mediastinum, potentially mediastinal drain, clinical correlation suggested. Electronically Signed   By: Zetta Bills M.D.   On: 03/10/2019 13:30   Dg Chest 2 View  Result Date: 03/20/2019 CLINICAL DATA:  Post CABG EXAM: CHEST - 2 VIEW COMPARISON:  Radiograph 03/18/2019 FINDINGS: Right PICC terminates at the superior cavoatrial junction. Postsurgical changes related to prior CABG including intact and aligned sternotomy wires and multiple surgical clips projecting over the mediastinum. Stable postoperative mediastinal widening. Bandlike areas of opacity likely reflect atelectatic change. Suspect at least small bilateral effusions. Nodular density in the left mid lung may reflect a hematoma at the site of prior chest tube placement. No acute osseous or soft tissue abnormality.  IMPRESSION: Stable appearance of bilateral effusions and basilar atelectasis. Stable nodular density in the left mid lung, possibly hematoma at site of prior chest tube. Electronically Signed   By: Lovena Le M.D.   On: 03/20/2019 05:59   Dg Chest 2 View  Result Date: 03/07/2019 CLINICAL DATA:  Chest pain for 2 weeks. EXAM: CHEST - 2 VIEW COMPARISON:  10/28/2017 and prior radiographs FINDINGS: Cardiomegaly and LEFT basilar scarring again noted. There is no evidence of focal airspace disease, pulmonary edema, suspicious pulmonary nodule/mass, pleural effusion, or pneumothorax. No acute bony abnormalities are identified. IMPRESSION: Cardiomegaly without evidence of acute cardiopulmonary disease. Electronically Signed   By: Margarette Canada M.D.   On: 03/07/2019 14:30   Ct Head Wo Contrast  Result Date: 03/16/2019 CLINICAL DATA:  63 year old male with altered mental status. Right ACA territory infarct. Episode of unresponsiveness. EXAM: CT HEAD WITHOUT CONTRAST TECHNIQUE: Contiguous axial images were obtained from the base of the skull through the vertex without intravenous contrast. COMPARISON:  CT head and CTA head and neck 03/14/2019. FINDINGS: Brain: Cytotoxic edema in the distal right ACA territory on series 3,  image 27 has not significantly changed. No associated hemorrhage or mass effect. There is additional patchy hypodensity in the right MCA/ACA watershed white matter on series 3, image 25 which is stable. Lesser left hemisphere white matter hypodensity. No midline shift, mass effect, or evidence of intracranial mass lesion. No ventriculomegaly. No new cortically based infarct. Vascular: Calcified atherosclerosis at the skull base. No suspicious intracranial vascular hyperdensity. a Skull: No acute osseous abnormality identified. Sinuses/Orbits: Mildly increased paranasal sinus mucosal thickening and opacity. Tympanic cavities and mastoids remain clear. Other: No acute orbit or scalp soft tissue findings.  IMPRESSION: 1. Stable CT appearance of the distal right ACA and right ACA/MCA watershed ischemia. No associated hemorrhage or mass effect. Underlying chronic white matter disease. 2. No new intracranial abnormality. Electronically Signed   By: Genevie Ann M.D.   On: 03/16/2019 13:38   Ct Head Wo Contrast  Result Date: 03/14/2019 CLINICAL DATA:  Stroke, follow-up. EXAM: CT HEAD WITHOUT CONTRAST TECHNIQUE: Contiguous axial images were obtained from the base of the skull through the vertex without intravenous contrast. COMPARISON:  CT head without contrast 03/13/2019 FINDINGS: Brain: Progressive hypoattenuation is noted within the right cingulate gyrus. Subjacent white matter hypoattenuation is progressed as well. No acute hemorrhage is noted. Precentral gyrus is intact. Periventricular white matter changes are otherwise stable. No significant extraaxial fluid collection is present. The brainstem and cerebellum are within normal limits. Vascular: Atherosclerotic changes are noted within the cavernous internal carotid arteries. There is no hyperdense vessel. Skull: Calvarium is intact. No focal lytic or blastic lesions are present. Sinuses/Orbits: Mild mucosal thickening is present within ethmoid air cells. No fluid levels are present. The globes and orbits are within normal limits. IMPRESSION: 1. Progressive right ACA territory nonhemorrhagic infarct. 2. Progressive adjacent white matter infarct. 3. Atherosclerosis. 4. Diffuse small vessel disease. Electronically Signed   By: San Morelle M.D.   On: 03/14/2019 12:12   Ct Head Wo Contrast  Result Date: 03/13/2019 CLINICAL DATA:  Generalized muscle weakness. Left-sided weakness EXAM: CT HEAD WITHOUT CONTRAST TECHNIQUE: Contiguous axial images were obtained from the base of the skull through the vertex without intravenous contrast. COMPARISON:  Brain MRI 04/18/2018 FINDINGS: Brain: Small area of cytotoxic appearance in the parasagittal and posterior right  frontal region, distal ACA territory. No hemorrhage, hydrocephalus, or masslike finding. Low-density in the cerebral white matter that is fairly symmetric and likely chronic microvascular ischemia but progressed from MR. Small cortically based calcification along the right hemisphere, nonspecific in isolation. Vascular: Atherosclerotic calcification. Skull: Negative. Sinuses/Orbits: Negative. Other: These results were communicated to Dr. Lorraine Lax at 6:52 amon 10/5/2020by text page via the [facility] messaging system. IMPRESSION: 1. Small acute cortical infarct in the distal right ACA territory. 2. Cerebral white matter low-density that is mild and symmetric but may have progressed from 2019, question right watershed white matter infarcts as well. Electronically Signed   By: Monte Fantasia M.D.   On: 03/13/2019 06:58   Ct Angio Neck W Or Wo Contrast  Result Date: 03/14/2019 CLINICAL DATA:  Anterior cerebral artery territory infarct follow-up EXAM: CT ANGIOGRAPHY HEAD AND NECK TECHNIQUE: Multidetector CT imaging of the head and neck was performed using the standard protocol during bolus administration of intravenous contrast. Multiplanar CT image reconstructions and MIPs were obtained to evaluate the vascular anatomy. Carotid stenosis measurements (when applicable) are obtained utilizing NASCET criteria, using the distal internal carotid diameter as the denominator. CONTRAST:  12mL OMNIPAQUE IOHEXOL 350 MG/ML SOLN COMPARISON:  CTA head neck 10/23/2017 Head CT  03/13/2019 FINDINGS: CTA NECK FINDINGS SKELETON: There is no bony spinal canal stenosis. No lytic or blastic lesion. OTHER NECK: Status post recent coronary artery bypass graft with median sternotomy. There is a retrosternal fluid collection that measures 4.3 x 2.0 x 5.2 cm. UPPER CHEST: Bilateral pleural effusions. There is a large loculated component of the left effusion. AORTIC ARCH: There is moderate calcific atherosclerosis of the aortic arch. There is no  aneurysm, dissection or hemodynamically significant stenosis of the visualized portion of the aorta. Normal variant aortic arch branching pattern with the brachiocephalic and left common carotid arteries sharing a common origin. The visualized proximal subclavian arteries are widely patent. RIGHT CAROTID SYSTEM: There is mild narrowing of the proximal common carotid artery due to noncalcified plaque. There is circumferential atherosclerotic calcification at the carotid bifurcation and proximal internal carotid artery resulting in less than 50% stenosis. The distal internal carotid artery is widely patent. Proximal external carotid artery is normal. LEFT CAROTID SYSTEM: Mild common carotid atherosclerosis. Circumferential calcification of the proximal ICA without hemodynamically significant stenosis. Normal distal ICA aside from minor calcification. There is mild narrowing of the origin of the external carotid artery due to noncalcified plaque. VERTEBRAL ARTERIES: Right dominant configuration. Poor visualization of the right origin. Left origin is patent. There is no dissection, occlusion or flow-limiting stenosis to the skull base (V1-V3 segments). CTA HEAD FINDINGS POSTERIOR CIRCULATION: --Vertebral arteries: Normal right V4 segment. There is loss of opacification of the left V4 segment distal to the PICA origin. --Posterior inferior cerebellar arteries (PICA): Patent origins from the vertebral arteries. --Anterior inferior cerebellar arteries (AICA): Right dominant --Basilar artery: Diminutive --Superior cerebellar arteries: Normal. --Posterior cerebral arteries: Normal. Both are predominantly supplied by the posterior communicating arteries (p-comm). ANTERIOR CIRCULATION: --Intracranial internal carotid arteries: Atherosclerotic calcification of the internal carotid arteries at the skull base without hemodynamically significant stenosis. --Anterior cerebral arteries (ACA): There is a new focus of calcification in  the distal right A2 segment (series 12, image 22) that may indicate partially calcified thrombus. However, the remainder of the vessel is normally opacified. Calcification in the left A2 segment is unchanged. Both A1 segments are present. Patent anterior communicating artery (a-comm). --Middle cerebral arteries (MCA): Normal. VENOUS SINUSES: As permitted by contrast timing, patent. ANATOMIC VARIANTS: Bilateral fetal origins of the posterior cerebral arteries. Review of the MIP images confirms the above findings. IMPRESSION: 1. No emergent large vessel occlusion or hemodynamically significant stenosis. 2. New focus of calcification in the right anterior cerebral artery distal A2 segment (s12:i22), which may indicate partially calcified thrombus. The vessel is patent distal to this, however. 3. Decreased opacification of the distal V4 segment of the left vertebral artery, distal to the PICA origin, age-indeterminate. Bilateral fetal origins of the posterior cerebral arteries with diminutive vertebrobasilar system. 4. Status post recent coronary artery bypass graft with retrosternal fluid collection that measures 4.3 x 2.0 x 5.2 cm. In the immediate postoperative setting, this is favored to be a seroma. 5. Bilateral pleural effusions with large loculated component of the left effusion. 6. Aortic Atherosclerosis (ICD10-I70.0). Electronically Signed   By: Ulyses Jarred M.D.   On: 03/14/2019 19:36   Dg Chest Port 1 View  Result Date: 03/21/2019 CLINICAL DATA:  63 year old male with pleural effusion. EXAM: PORTABLE CHEST 1 VIEW COMPARISON:  Chest radiograph dated 03/20/2019 FINDINGS: Right-sided PICC in stable position. No significant interval change in the size of the pleural effusion or associated bibasilar atelectasis/infiltrate since the prior radiograph. No pneumothorax. Stable area of nodular  density the left mid lung field as seen previously. Stable cardiomegaly. Median sternotomy wires and postsurgical changes  of CABG. No acute osseous pathology. IMPRESSION: 1. No significant interval change in the size of the pleural effusions and associated bibasilar atelectasis/infiltrate. 2. Stable cardiomegaly. Electronically Signed   By: Anner Crete M.D.   On: 03/21/2019 16:01   Dg Chest Port 1 View  Result Date: 03/18/2019 CLINICAL DATA:  Status post coronary bypass graft. EXAM: PORTABLE CHEST 1 VIEW COMPARISON:  March 16, 2019. FINDINGS: Stable cardiomegaly. Status post coronary bypass graft. Right-sided PICC line is unchanged in position. Stable left perihilar density is noted concerning for atelectasis. No pneumothorax is noted. Stable bilateral pleural effusions are noted with associated atelectasis. Bony thorax is unremarkable. IMPRESSION: Stable bilateral pleural effusions with associated atelectasis. Electronically Signed   By: Marijo Conception M.D.   On: 03/18/2019 09:58   Dg Chest Port 1 View  Result Date: 03/16/2019 CLINICAL DATA:  63 year old male with shortness of breath, pleural effusion. Status post CABG postoperative day 6. EXAM: PORTABLE CHEST 1 VIEW COMPARISON:  Portable chest 03/15/2019 and earlier. FINDINGS: Portable AP semi upright view at 0527 hours. Right IJ introducer sheath is removed. Right PICC line remains. Stable cardiomegaly and lung volumes. No pneumothorax or pulmonary edema. Veiling lung base opacity and dense retrocardiac opacity. Superimposed nodular probable segmental atelectasis near the left hilum. No acute osseous abnormality identified. IMPRESSION: 1. Right IJ introducer sheath removed.  Stable right PICC line. 2. Evidence of bilateral pleural effusions with retrocardiac and left perihilar atelectasis. Electronically Signed   By: Genevie Ann M.D.   On: 03/16/2019 08:17   Dg Chest Port 1 View  Result Date: 03/15/2019 CLINICAL DATA:  Status post coronary artery bypass graft. EXAM: PORTABLE CHEST 1 VIEW COMPARISON:  Radiograph of March 14, 2019. FINDINGS: Stable cardiomegaly.  Status post coronary bypass graft. Right-sided PICC line is unchanged in position. Stable bibasilar atelectasis is noted with associated pleural effusions. Stable left midlung opacity is noted most consistent with small hematoma related to prior chest tube. Bony thorax is unremarkable. IMPRESSION: Stable bibasilar atelectasis is noted with associated pleural effusions. Stable rounded density seen in left midlung most consistent with hematoma secondary to prior chest tube. Electronically Signed   By: Marijo Conception M.D.   On: 03/15/2019 08:33   Dg Chest Port 1 View  Result Date: 03/14/2019 CLINICAL DATA:  Short of breath.  Status post CABG surgery. EXAM: PORTABLE CHEST 1 VIEW COMPARISON:  03/13/2019 and earlier exams. FINDINGS: Left chest tube has been removed. There is opacity corresponding to the chest tube tract consistent with fluid/atelectasis. No pneumothorax. Persistent opacity noted at the bases consistent with a combination of pleural effusions and atelectasis. No convincing pulmonary edema. No mediastinal widening.  Cardiac silhouette is enlarged but stable. Stable right internal jugular Cordis. IMPRESSION: 1. Status post removal of the left chest tube.  No pneumothorax. 2. Persistent lung base opacity consistent with a combination of pleural effusions and atelectasis. Electronically Signed   By: Lajean Manes M.D.   On: 03/14/2019 08:23   Dg Chest Port 1 View  Result Date: 03/13/2019 CLINICAL DATA:  Chest tube placement EXAM: PORTABLE CHEST 1 VIEW COMPARISON:  03/12/2019 FINDINGS: The left-sided chest tube is stable in positioning. The right-sided central venous catheter is stable in positioning. The heart size remains significantly enlarged. The patient is status post prior median sternotomy. There are bilateral pleural effusions with adjacent atelectasis. There is no pneumothorax. There is generalized volume  overload. IMPRESSION: 1. Lines and tubes as above. 2. No pneumothorax. 3. Cardiomegaly  with persistent bilateral pleural effusions and adjacent atelectasis. Electronically Signed   By: Constance Holster M.D.   On: 03/13/2019 08:02   Dg Chest Port 1 View  Result Date: 03/12/2019 CLINICAL DATA:  Status post surgery. EXAM: PORTABLE CHEST 1 VIEW COMPARISON:  03/11/2019 FINDINGS: Interval removal of the Swan-Ganz catheter. Right jugular central venous sheath remains in place. Left-sided chest tube in unchanged position. No left pneumothorax. Mild left basilar atelectasis. Right basilar airspace disease likely reflecting atelectasis with a small right pleural effusion. No right pneumothorax. Stable cardiomediastinal silhouette. Recent CABG. IMPRESSION: 1. Left-sided chest tube in unchanged position. No left pneumothorax. Mild left basilar atelectasis. 2. Right basilar atelectasis with a small right pleural effusion. Electronically Signed   By: Kathreen Devoid   On: 03/12/2019 09:11   Dg Chest Port 1 View  Result Date: 03/11/2019 CLINICAL DATA:  CABG x3 03/10/2019. EXAM: PORTABLE CHEST 1 VIEW COMPARISON:  03/10/2019 FINDINGS: Right IJ Swan-Ganz catheter unchanged with tip over the right main pulmonary artery. Left-sided chest tube unchanged. Mediastinal drain unchanged. Lungs are adequately inflated with very minimal bibasilar density likely small amount of bilateral pleural fluid/atelectasis. Mild stable cardiomegaly. Remainder of the exam is unchanged. IMPRESSION: Suggestion of small amount of bilateral pleural fluid/atelectasis. Stable cardiomegaly. Tubes and lines as described. Electronically Signed   By: Marin Olp M.D.   On: 03/11/2019 08:46   Dg Chest Port 1 View  Result Date: 03/10/2019 CLINICAL DATA:  S/p CABG x 3, chest tube in place, pt intubated. EXAM: PORTABLE CHEST 1 VIEW COMPARISON:  Chest radiograph 03/10/2019 at 1:09 p.m. FINDINGS: Stable enlarged cardiomediastinal contours status post median sternotomy. Interval intubation with endotracheal tube tip projecting between the  thoracic inlet and carina. Stable remaining support apparatus including a PA catheter, enteric tube and left chest tube. The lungs are well aerated. No new focal pulmonary opacity. No pneumothorax or large pleural effusion. IMPRESSION: 1. Interval intubation with endotracheal tube tip projecting between the thoracic inlet and carina. 2. Postoperative appearance of the chest. Electronically Signed   By: Audie Pinto M.D.   On: 03/10/2019 15:08   Dg Chest Port 1v Same Day  Result Date: 03/14/2019 CLINICAL DATA:  Bedside PICC placement. EXAM: PORTABLE CHEST 1 VIEW 1:04 p.m.: COMPARISON:  Portable chest x-ray earlier same day at 5:29 a.m. and previously. FINDINGS: RIGHT arm PICC tip projects at or near the cavoatrial junction. RIGHT jugular introducer sheath tip projects at the junction of the jugular and innominate veins. Prior sternotomy for CABG. Cardiac silhouette markedly enlarged. Normal pulmonary vascularity. Bibasilar atelectasis, unchanged since earlier in the day. Round opacity in the LEFT UPPER LOBE at the site of the prior chest tube, unchanged, presumably a small hematoma. No new pulmonary parenchymal abnormalities. IMPRESSION: 1. RIGHT arm PICC tip projects at or near the cavoatrial junction. 2. Remaining support apparatus satisfactory. 3. Stable bibasilar atelectasis. 4. Stable rounded opacity in the LEFT upper lobe at the site of the prior chest tube, presumably a small hematoma. 5. No new abnormalities. Electronically Signed   By: Evangeline Dakin M.D.   On: 03/14/2019 13:18   Vas US Doppler Pre Cabg  Result Date: 03/09/2019 PREOPERATIVE VASCULAR EVALUATION  Indications:      Pre CABG evaluation. Risk Factors:     Hypertension, hyperlipidemia, coronary artery disease. Other Factors:    Atrial fibrillation. Comparison Study: Prior study done 05/23/16 is available for comparison Performing Technologist: [place], [place]  RVS Supporting Technologist: Sharion Dove RVS  Examination  Guidelines: A complete evaluation includes B-mode imaging, spectral Doppler, color Doppler, and power Doppler as needed of all accessible portions of each vessel. Bilateral testing is considered an integral part of a complete examination. Limited examinations for reoccurring indications may be performed as noted.  Right Carotid Findings: +----------+--------+--------+--------+--------+------------------+           PSV cm/sEDV cm/sStenosisDescribeComments           +----------+--------+--------+--------+--------+------------------+ CCA Prox  46      6               calcific                   +----------+--------+--------+--------+--------+------------------+ CCA Distal42      7                       intimal thickening +----------+--------+--------+--------+--------+------------------+ ICA Prox  46      14              calcificShadowing          +----------+--------+--------+--------+--------+------------------+ ICA Distal52      11                                         +----------+--------+--------+--------+--------+------------------+ ECA       108     11                                         +----------+--------+--------+--------+--------+------------------+ Portions of this table do not appear on this page. +----------+--------+-------+--------+------------+           PSV cm/sEDV cmsDescribeArm Pressure +----------+--------+-------+--------+------------+ Subclavian161                    145          +----------+--------+-------+--------+------------+ +---------+--------+--+--------+--+ VertebralPSV cm/s34EDV cm/s11 +---------+--------+--+--------+--+ Left Carotid Findings: +----------+--------+--------+--------+------------+------------------+           PSV cm/sEDV cm/sStenosisDescribe    Comments           +----------+--------+--------+--------+------------+------------------+ CCA Prox  55      13              homogeneous                     +----------+--------+--------+--------+------------+------------------+ CCA Distal57      12                          intimal thickening +----------+--------+--------+--------+------------+------------------+ ICA Prox  66      22              heterogenous                   +----------+--------+--------+--------+------------+------------------+ ICA Distal77      21                          tortuous           +----------+--------+--------+--------+------------+------------------+ ECA       184     19                                             +----------+--------+--------+--------+------------+------------------+ +----------+--------+--------+--------+------------+  SubclavianPSV cm/sEDV cm/sDescribeArm Pressure +----------+--------+--------+--------+------------+           220                     145          +----------+--------+--------+--------+------------+ +---------+--------+--+--------+-+ VertebralPSV cm/s33EDV cm/s7 +---------+--------+--+--------+-+  ABI Findings: +---------+------------------+-----+----------+--------+ Right    Rt Pressure (mmHg)IndexWaveform  Comment  +---------+------------------+-----+----------+--------+ Brachial 145                    triphasic          +---------+------------------+-----+----------+--------+ PTA      104               0.72 monophasic         +---------+------------------+-----+----------+--------+ DP       117               0.81 monophasic         +---------+------------------+-----+----------+--------+ Great Toe76                0.52                    +---------+------------------+-----+----------+--------+ +---------+------------------+-----+---------+-------+ Left     Lt Pressure (mmHg)IndexWaveform Comment +---------+------------------+-----+---------+-------+ Brachial 145                    triphasic        +---------+------------------+-----+---------+-------+ PTA       186               1.28 biphasic         +---------+------------------+-----+---------+-------+ DP       175               1.21 biphasic         +---------+------------------+-----+---------+-------+ Great Toe118               0.81                  +---------+------------------+-----+---------+-------+ +-------+---------------+----------------+ ABI/TBIToday's ABI/TBIPrevious ABI/TBI +-------+---------------+----------------+ Right  0.81/0.52      No previous      +-------+---------------+----------------+ Left   1.28/0.81      No previous      +-------+---------------+----------------+  Right Doppler Findings: +-----------+--------+-----+---------+--------+ Site       PressureIndexDoppler  Comments +-----------+--------+-----+---------+--------+ Brachial   145          triphasic         +-----------+--------+-----+---------+--------+ Radial                  triphasic         +-----------+--------+-----+---------+--------+ Ulnar                   triphasic         +-----------+--------+-----+---------+--------+ Palmar Arch                      normal   +-----------+--------+-----+---------+--------+  Left Doppler Findings: +-----------+--------+-----+---------+--------+ Site       PressureIndexDoppler  Comments +-----------+--------+-----+---------+--------+ Brachial   145          triphasic         +-----------+--------+-----+---------+--------+ Radial                  triphasic         +-----------+--------+-----+---------+--------+ Ulnar                   triphasic         +-----------+--------+-----+---------+--------+  Palmar Arch                      normal   +-----------+--------+-----+---------+--------+  Summary: Right Carotid: Velocities in the right ICA are consistent with a 1-39% stenosis.                No change since study done 05/23/16. Left Carotid: Velocities in the left ICA are consistent with a 1-39% stenosis.                No change since study done 05/23/16. Vertebrals:  Bilateral vertebral arteries demonstrate antegrade flow. Subclavians: Normal flow hemodynamics were seen in bilateral subclavian              arteries. Right ABI: Resting right ankle-brachial index indicates mild right lower extremity arterial disease. The right toe-brachial index is abnormal. Left ABI: Resting left ankle-brachial index is within normal range. No evidence of significant left lower extremity arterial disease. The left toe-brachial index is normal. Right Upper Extremity: Doppler waveforms remain within normal limits with right radial compression. Doppler waveforms remain within normal limits with right ulnar compression. Left Upper Extremity: Doppler waveforms remain within normal limits with left radial compression. Doppler waveforms remain within normal limits with left ulnar compression.  Electronically signed by Servando Snare MD on 03/09/2019 at 5:45:33 PM.    Final    Ct Head Code Stroke Wo Contrast  Result Date: 03/22/2019 CLINICAL DATA:  Code stroke. Altered level of consciousness (LOC), unexplained. Confusion. EXAM: CT HEAD WITHOUT CONTRAST TECHNIQUE: Contiguous axial images were obtained from the base of the skull through the vertex without intravenous contrast. COMPARISON:  Head CT 03/16/2019, CT angiogram head 03/14/2019, brain MRI 04/18/2018, CT angiogram head/neck 10/23/2017 FINDINGS: Brain: Unchanged appearance of a subacute right ACA as well as ACA/MCA watershed territory infarction. No new demarcated cortical infarction is identified. No evidence of intracranial hemorrhage. No midline shift or extra-axial fluid collection. Stable generalized parenchymal atrophy and chronic small vessel ischemic disease. Partially empty sella turcica. Vascular: No hyperdense vessel.  Atherosclerotic calcification. Skull: No calvarial fracture or suspicious osseous lesion. Sinuses/Orbits: Minimal scattered paranasal sinus mucosal thickening  at the imaged levels. Small amount of frothy secretions within a posterior left ethmoid air cell. No significant mastoid effusion. These results were communicated to Dr. Leonie Man At 5:06 pmon 10/14/2020by text page via the [facility] messaging system. IMPRESSION: 1. Unchanged appearance of subacute right ACA as well as ACA/MCA watershed territory infarcts. No new acute infarction is demonstrated. No intracranial hemorrhage. 2. Stable atrophy and chronic small vessel ischemic disease. Electronically Signed   By: Kellie Simmering   On: 03/22/2019 17:13   Vas [place] Transcranial Doppler  Result Date: 03/14/2019  Transcranial Doppler Indications: ICH. History: Post op CABG 03/10/19. Hx TIA. New onset left side weakness 03/12/19. Limitations for diagnostic windows: Unable to insonate occipital window. Comparison Study: no prior Performing Technologist: June Leap RDMS, RVT  Examination Guidelines: A complete evaluation includes B-mode imaging, spectral Doppler, color Doppler, and power Doppler as needed of all accessible portions of each vessel. Bilateral testing is considered an integral part of a complete examination. Limited examinations for reoccurring indications may be performed as noted.  +----------+-------------+----------+-----------+--------------+ RIGHT TCD Right VM (cm)Depth (cm)Pulsatility   Comment     +----------+-------------+----------+-----------+--------------+ MCA           63.00                 1.18                   +----------+-------------+----------+-----------+--------------+  ACA          -28.00                 0.97                   +----------+-------------+----------+-----------+--------------+ Term ICA      56.00                   1                    +----------+-------------+----------+-----------+--------------+ PCA           18.00                 1.11                   +----------+-------------+----------+-----------+--------------+ Opthalmic     22.00                  1.52                   +----------+-------------+----------+-----------+--------------+ ICA siphon    38.00                 1.17                   +----------+-------------+----------+-----------+--------------+ Vertebral                                   not visualized +----------+-------------+----------+-----------+--------------+  +----------+------------+----------+-----------+--------------+ LEFT TCD  Left VM (cm)Depth (cm)Pulsatility   Comment     +----------+------------+----------+-----------+--------------+ MCA          67.00                 0.84                   +----------+------------+----------+-----------+--------------+ ACA          -24.00                1.06                   +----------+------------+----------+-----------+--------------+ Term ICA     60.00                 1.08                   +----------+------------+----------+-----------+--------------+ PCA          44.00                 1.02                   +----------+------------+----------+-----------+--------------+ Opthalmic    19.00                 1.66                   +----------+------------+----------+-----------+--------------+ ICA siphon   38.00                 0.95                   +----------+------------+----------+-----------+--------------+ Vertebral                                  not visualized +----------+------------+----------+-----------+--------------+  +------------+-------+------------------------+             VM cm/s  Comment          +------------+-------+------------------------+ Prox Basilar-14.00 Poorly visualized window +------------+-------+------------------------+ Summary:  Normalmean flow velocities in identified vessels of anterior cerebral cirn. Suboptimal occipital window limits evaluation of posterior circulation vessels. *See table(s) above for measurements and observations.  Diagnosing physician: Antony Contras MD  Electronically signed by Antony Contras MD on 03/14/2019 at 8:07:57 AM.    Final    [place] Ekg Site Rite  Result Date: 03/14/2019 If Site Rite image not attached, placement could not be confirmed due to current cardiac rhythm.   PHYSICAL EXAM   Temp:  [97.5 F (36.4 C)-98.1 F (36.7 C)] 97.5 F (36.4 C) (10/14 1112) Pulse Rate:  [59-80] 59 (10/14 1112) Resp:  [17-20] 20 (10/14 1112) BP: (107-171)/(75-99) 107/81 (10/14 1112) SpO2:  [90 %-98 %] 97 % (10/14 1112) Weight:  [124.9 kg] 124.9 kg (10/14 0344)  General -obese middle-aged Caucasian male not in distress. . Afebrile. Head is nontraumatic. Neck is supple without bruit.    Cardiac exam no murmur or gallop. Lungs are clear to auscultation. Distal pulses are well felt. Neurological Exam ;  Awake alert oriented to time place and person.  Diminished attention, registration and recall.  Follows 1 and two-step commands.  No dysarthria or aphasia.  Extraocular movements are full range without nystagmus.  Blinks to threat bilaterally.  Face is symmetric without weakness.  Tongue midline. Motor Strength -mild left hemiparesis with 4/5 left upper extremity and 3/5 left lower extremity strength.  Weakness of left grip and intrinsic hand muscles. . Sensation is equal bilaterally.  Deep tendon reflexes are symmetric.  Plantars are downgoing. Coordination - The patient had normal movements in right hand with no ataxia or dysmetria.  Tremor was absent.  Gait and Station - deferred.   ASSESSMENT/PLAN Mr. KAHLEB MARIK is a 63 y.o. male with history of A. fib on Eliquis, CAD, hypertension, hyperlipidemia, OSA on CPAP admitted for unstable angina, status post CABG 10/2.  Found to have left lower extremity weakness yesterday afternoon but worsening this morning with left lower extremity plegic.  No tPA given due to outside window and recent surgery.     Acute episode of confusion and disorientation upon awakening of unclear etiology.  Possibly  underlying encephalopathy following recent cardiac surgery and medication effect of benzos and sleep deprivation  Plan stat CT to rule out bleeding  BP reported normal, no hypotension  Will do EEG to rule out seizure  Avoid low BP  Continue Xarelto if no bleed  Stroke: 03/13/2019 s/p CABG right posterior ACA distal infarct, etiology uncertain, could be A. fib while holding on AC versus intracranial atherosclerosis in the setting of low BP  Resultant left arm weakness and left leg plegic  MRI not able to perform due to recent CABG procedure  CT head showed right distal ACA infarct  CTA head and neck No ELVO. New R ACA A1 partially calcified clot. L V4 opacifiecation. B fetal origin PCAs w/ small VB system.   Repeat CT 10/6 stable small right ACA infarct  Carotid Doppler 10/1 bilateral ICA 1 to 39% stenosis  TEE 10/2 EF 55 to 60%, no PFO  TCD showed no proximal intracranial large vessel stenosis  LDL 85  HgbA1c 5.6  Lovenox for VTE prophylaxis  Eliquis (apixaban) daily prior to admission, now on aspirin 81 mg daily. On heparin IV 10/6 per stroke protocol, so far tolerating well, OK with transition back to Eliquis  Therapy recommendations: CIR  Disposition: Pending  Hx of hypertension Hypotension  BP low at 70s-90s on 10/4 from midnight to morning, now much improved  Pt on cardizem drip, milrinone drip, metoprolol po which all have impact on low BP  Yesterday BP 110-120s  Avoid low BP  BP goal 110-140 to maintain cerebral perfusion  Now BP 130-160s  afib RVR  Cardiology on board  On amiodarone, cardizem and metoprolol  On eliquis Unstable angina / CAD s/p CABG  CABG 10/2  CTS and cardiology on board  On ASA 81 and lipitor 80  AKI   Cre 0.88->2.21->1.33 ->0.97   On IVF  Manage per primary team  Hyperlipidemia  Home meds:  lipitor   LDL 85, goal < 70  Now on lipitor 80  Continue statin at discharge  Other Stroke Risk  Factors  Advanced age  Morbid Obesity, Body mass index is 41.87 kg/m.   Hx stroke/TIA  Obstructive sleep apnea, on CPAP at home  Other Active Problems  Hyponatremia - Na 129->129->126    Leukocytosis WBC 12.8->10.8->10.4    Thrombocytopenia, resolved - Platelet 127->163->167   Hospital day # 15  I had a long discussion with the patient and his wife as well as nurse about his transient confusion disorientation which likely appears to be multifactorial and less likely to represent a stroke.  However will check stat CT scan of the head to rule out bleed since he is on anticoagulation.  Recommend discontinue Xanax and instead use Celexa for anxiety.  Add low-dose trazodone to help him sleep at night.  Check EEG for seizure activity. This patient is critically ill and at significant risk of neurological worsening, death and care requires constant monitoring of vital signs, hemodynamics,respiratory and cardiac monitoring, extensive review of multiple databases, frequent neurological assessment, discussion with family, other specialists and medical decision making of high complexity.I have made any additions or clarifications directly to the above note.This critical care time does not reflect procedure time, or teaching time or supervisory time of PA/NP/Med Resident etc but could involve care discussion time.  I spent 30 minutes of neurocritical care time  in the care of  this patient.    Antony Contras, MD Stroke Neurology 03/22/2019 5:34 PM    To contact Stroke Continuity provider, please refer to [email or web address] After hours, contact General Neurology

## 2019-03-22 NOTE — Progress Notes (Signed)
Pt requested to have CPAP masked removed and to be placed  Back on . Pt on 4 liters oxygen.

## 2019-03-22 NOTE — Progress Notes (Signed)
  Speech Language Pathology Treatment: Cognitive-Linquistic  Patient Details Name: Martin [name] MRN: [id number] DOB: 1955-07-08 Today's Date: 03/22/2019 Time: [id number] SLP Time Calculation (min) (ACUTE ONLY): 16 min  Assessment / Plan / Recommendation Clinical Impression  Pt sustains his attention well, but becomes easily distracted when faced with mild distractions in the room, such as when the phone rings. Min cues were given for verbal problem solving and Mod for safety awareness. Pt acknowledges that he is needing help from multiple people to get up, but says he wants to go home and "just see" how he can do. Min cues were also provided for recall of daily events. Agree that he would benefit from CIR f/u to address cognitive skills given high level of independence PTA.   HPI HPI: Pt is a 63 y/o male with PMH of  CAD, paroxysmal atrial fibrillation, OA, low back pain, polysubstance abuse, presents with chest pain and possible NSTEMI. S/p L heart cath and coronary angiography 9/30, CABG x 3 03/10/19.  Course complicated by CT revealing R distal ACA infarct 03/13/19. New CXR 10/8 afer full liquid diet initiated: "Evidence of bilateral pleural effusions with retrocardiac and left perihilar atelectasis."      SLP Plan  Continue with current plan of care       Recommendations                   Follow up Recommendations: Inpatient Rehab SLP Visit Diagnosis: Cognitive communication deficit [id number]) Plan: Continue with current plan of care       GO                Venita Sheffield Beatris Belen 03/22/2019, 10:23 AM  Pollyann Glen, M.A. [place] Acute Environmental education officer [phone] Office [phone]

## 2019-03-22 NOTE — Progress Notes (Signed)
Responded to Overhead Page Code Stroke.  Found Martin [name] in the hall looking confused about what had just happened.  She was scared when she heard the Code Stroke and knew it was for her husband.  We stood in the hall while the team decided they were going to take Martin [name] for a CAT scan.  In the meantime, Martin [name] was talking, responding to commands from doctor and nurses.  Martin [name] was back in the room with her husband.  Chaplain was released with assurances a follow-up page would be issued if needed.  De Burrs Chaplain Resident Pager: [phone]

## 2019-03-22 NOTE — Progress Notes (Signed)
Occupational Therapy Treatment Patient Details Name: Martin [name] MRN: [id number] DOB: [birth year] Today's Date: 03/22/2019    History of present illness Pt is a 63 y/o male with PMH of  CAD, paroxysmal atrial fibrillation, OA, low back pain, polysubstance abuse, presents with chest pain and possible NSTEMI. S/p L heart cath and coronary angiography 9/30, CABG x 3 03/10/19.  Course complicated by CT revealing R distal ACA infarct 03/13/19. Pt with periods of lethargy; EEG WNL.   OT comments  Patient progressing slowly towards OT goals. Patient requires min assist +2 for transfers, able to engage in grooming tasks standing at sink with min assist +2 safety but fatigues easily, requiring increased support to maintain upright posture with L lateral lean. Requires multimodal cueing throughout session for sequencing and problem solving.  Will follow acutely, CIR remains appropriate.    Follow Up Recommendations  CIR;Supervision/Assistance - 24 hour    Equipment Recommendations  Other (comment)(TBd at next venue of care)    Recommendations for Other Services Rehab consult;Speech consult    Precautions / Restrictions Precautions Precautions: Fall;Sternal Precaution Booklet Issued: No Precaution Comments: Lft hemiparesis Restrictions Weight Bearing Restrictions: Yes Other Position/Activity Restrictions: sternal precautions        Mobility Bed Mobility Overal bed mobility: Needs Assistance Bed Mobility: Supine to Sit     Supine to sit: Max assist;HOB elevated     General bed mobility comments: attempted log rolling, but limited by processing and motor planning; transitioned to EOB with max assist for trunk support and scooting hips as able to manage LEs to EOB  Transfers Overall transfer level: Needs assistance Equipment used: Rolling walker (2 wheeled) Transfers: Sit to/from Stand Sit to Stand: Min assist;+2 physical assistance;+2 safety/equipment         General transfer  comment: cueing for hand placement and technique, able to transition from EOB and recliner with min assist +2 to power up and steady    Balance Overall balance assessment: Needs assistance Sitting-balance support: Feet supported;No upper extremity supported Sitting balance-Leahy Scale: Fair Sitting balance - Comments: left lateral lean but able to self correct to midline with cues. Postural control: Left lateral lean Standing balance support: Bilateral upper extremity supported;No upper extremity supported;During functional activity Standing balance-Leahy Scale: Poor Standing balance comment: pt able to stand statically without UE support, but fatigues easily and presents with increased L lateral lean; relaint on BUE support dynamically                           ADL either performed or assessed with clinical judgement   ADL Overall ADL's : Needs assistance/impaired     Grooming: Minimal assistance;Oral care(+2 standing ) Grooming Details (indicate cue type and reason): washing face seated with setup assist, min assist to manage oral care in standing (cueing to use L hand as stabilizer) and min assist +2 fading to mod assist for standing balance when fatiguing                  Toilet Transfer: Minimal assistance;Moderate assistance;+2 for safety/equipment;+2 for physical assistance;Stand-pivot;RW Toilet Transfer Details (indicate cue type and reason): simulated in room to recliner          Functional mobility during ADLs: Moderate assistance;+2 for physical assistance;Rolling walker;Cueing for safety;Cueing for sequencing General ADL Comments: pt remains limited by cognition, L sided weakness, sternal precautions, and cognition      Vision       Perception  Praxis      Cognition Arousal/Alertness: Awake/alert Behavior During Therapy: WFL for tasks assessed/performed Overall Cognitive Status: Impaired/Different from baseline Area of Impairment:  Memory;Attention;Safety/judgement;Awareness;Problem solving;Following commands                   Current Attention Level: Sustained Memory: Decreased short-term memory;Decreased recall of precautions Following Commands: Follows one step commands consistently;Follows multi-step commands inconsistently;Follows one step commands with increased time Safety/Judgement: Decreased awareness of safety;Decreased awareness of deficits Awareness: Emergent Problem Solving: Slow processing;Difficulty sequencing;Requires verbal cues;Requires tactile cues General Comments: pt with improving awareness to deficits, requires multimodal cueing functionally and demonstrates difficulty sequencing/problem solving        Exercises     Shoulder Instructions       General Comments pt on 4L supplemental O2 throughout session; cueing for PLB and SpO2 decreasing to 88% after mobility (but poor waveform)    Pertinent Vitals/ Pain       Pain Assessment: Faces Faces Pain Scale: No hurt  Home Living                                          Prior Functioning/Environment              Frequency  Min 3X/week        Progress Toward Goals  OT Goals(current goals can now be found in the care plan section)  Progress towards OT goals: Progressing toward goals  Acute Rehab OT Goals Patient Stated Goal: "Get home to my grandkids" OT Goal Formulation: With patient  Plan Discharge plan remains appropriate;Frequency remains appropriate    Co-evaluation    PT/OT/SLP Co-Evaluation/Treatment: Yes Reason for Co-Treatment: Complexity of the patient's impairments (multi-system involvement)   OT goals addressed during session: ADL's and self-care      AM-PAC OT "6 Clicks" Daily Activity     Outcome Measure   Help from another person eating meals?: A Little Help from another person taking care of personal grooming?: A Lot Help from another person toileting, which includes using  toliet, bedpan, or urinal?: Total Help from another person bathing (including washing, rinsing, drying)?: A Lot Help from another person to put on and taking off regular upper body clothing?: A Lot Help from another person to put on and taking off regular lower body clothing?: A Lot 6 Click Score: 12    End of Session Equipment Utilized During Treatment: Oxygen  OT Visit Diagnosis: Other abnormalities of gait and mobility (R26.89);Hemiplegia and hemiparesis;Other symptoms and signs involving cognitive function Hemiplegia - Right/Left: Left Hemiplegia - dominant/non-dominant: Non-Dominant Hemiplegia - caused by: Cerebral infarction   Activity Tolerance Patient tolerated treatment well   Patient Left with call bell/phone within reach;with bed alarm set;in chair;with chair alarm set   Nurse Communication Mobility status;Precautions        Time: [id number] OT Time Calculation (min): 43 min  Charges: OT General Charges $OT Visit: 1 Visit OT Treatments $Self Care/Home Management : 23-37 mins  Delight Stare, [place] Pager [phone] Office [phone]     Delight Stare 03/22/2019, 12:29 PM

## 2019-03-22 NOTE — Progress Notes (Signed)
At approximately [place] Pt's wife called this nurse into room, indicated the that was talking "out of his head". He was talking to her about his keys, and not other things the wife was worried about the things he was saying.  I did an orientation assessment on the Pt, he was able to recall the date and time, however he was not able to state where he was.  He indicate then when I told him he was in the hospital, he said [facility], but then said he was here for a check- up for the CABG he had.  He then was slow to recall information about dates and recalling information from the past. Neuro check was unchanged.  I notified PA on call for CT.  And advised to call [place] on the Pt.  Stroke team came, and then Pt went to CT scan.  Dr. Leonie Man at beside and completed assessment on Pt prior to going ,and changed the Pt's medications.

## 2019-03-22 NOTE — Progress Notes (Signed)
Patient has CPAP from home and is able to self-administer.  Patient understands to call RT or RN with any concerns.

## 2019-03-22 NOTE — Plan of Care (Signed)
  Problem: Education: Goal: Knowledge of General Education information will improve Description Including pain rating scale, medication(s)/side effects and non-pharmacologic comfort measures Outcome: Progressing   

## 2019-03-22 NOTE — Progress Notes (Addendum)
      [address]       [place] [address]             [phone]      12 Days Post-Op Procedure(s) (LRB): CORONARY ARTERY BYPASS GRAFTING (CABG)X 3 Using Left Internal Mammary Artery and Right Saphenous Vein Grafts (N/A) TRANSESOPHAGEAL ECHOCARDIOGRAM (TEE) (N/A)   Subjective:  Patient states he is hot this morning.  Otherwise is without specific complaints.  Continues to work with therapy, very motivated to regain his base level of function back.  Objective: Vital signs in last 24 hours: Temp:  [97.8 F (36.6 C)-98.4 F (36.9 C)] 98 F (36.7 C) (10/14 0344) Pulse Rate:  [58-78] 62 (10/13 2306) Cardiac Rhythm: Atrial fibrillation (10/13 2303) Resp:  [18-37] 18 (10/13 2306) BP: (92-156)/(71-107) 146/77 (10/14 0344) SpO2:  [89 %-100 %] 97 % (10/14 0344) Weight:  [124.9 kg] 124.9 kg (10/14 0344)  Intake/Output from previous day: 10/13 0701 - 10/14 0700 In: 600 [P.O.:590; I.V.:10] Out: 2185 [Urine:2185]  General appearance: alert, cooperative and no distress Heart: regular rate and rhythm Lungs: clear to auscultation bilaterally Abdomen: soft, non-tender; bowel sounds normal; no masses,  no organomegaly Extremities: edema trace Wound: clean and dry  Lab Results: Recent Labs    03/21/19 0500 03/22/19 0425  WBC 20.1* 17.6*  HGB 12.7* 11.4*  HCT 37.0* 34.6*  PLT 400 351   BMET:  Recent Labs    03/20/19 0436 03/21/19 0500  NA 131* 129*  K 3.6 4.1  CL 94* 94*  CO2 26 26  GLUCOSE 105* 118*  BUN 20 16  CREATININE 0.87 0.70  CALCIUM 8.4* 8.5*    PT/INR: No results for input(s): LABPROT, INR in the last 72 hours. ABG    Component Value Date/Time   PHART 7.478 (H) 03/16/2019 1239   HCO3 24.2 03/16/2019 1239   TCO2 25 03/16/2019 1239   ACIDBASEDEF 3.0 (H) 03/10/2019 1833   O2SAT 62.5 03/18/2019 0430   CBG (last 3)  Recent Labs    03/21/19 1633 03/21/19 2124 03/22/19 0635  GLUCAP 135* 104* 108*    Assessment/Plan: S/P  Procedure(s) (LRB): CORONARY ARTERY BYPASS GRAFTING (CABG)X 3 Using Left Internal Mammary Artery and Right Saphenous Vein Grafts (N/A) TRANSESOPHAGEAL ECHOCARDIOGRAM (TEE) (N/A)  1. CV- NSR, on Amiodarone, Lopressor, Cardizem discontinued by Cardiology this morning.. will resume Eliquis this morning 2. Pulm- wean oxygen as tolerated, OSA CPAP nightly 3. Renal- creatinine stable, weight is trending down, on IV lasix 4. ID- leukocytosis improved, remains afebrile, UA, blood cultures remain pending 5. H/o Stroke- residual defects present... continue PT/OT 6. Urinary retention- foley inserted, UA pending, will likely remove in future at urology follow up 7. Deconditioning- placement for CIR withdrawn, can replace once medical issues are resolved 8. Dispo- patient stable, continue current care   LOS: 15 days    Erin Barrett 03/22/2019 Slow progression urinary retention requiring replacement of Foley Not ready for rehab currently I have seen and examined Martin [name] and agree with the above assessment  and plan.  Grace Isaac MD [facility] [phone] Office [phone] 03/22/2019 3:32 PM

## 2019-03-22 NOTE — H&P (Signed)
Physical Medicine and Rehabilitation Admission H&P    Chief Complaint  Patient presents with  . debility    HPI: Martin [name] is a 63 year old male with history of CAD/PAF-on Eliquis, OSA,  who was admitted on 03/07/19 with CP and had mild elevation in troponins with subtle EKG changes concerning for NSTEMI.  2D echo showed mild increase in LVH with basal inferior akinesis and EF 55-60%. He underwent cardiac cath revealing severe 3V CAD and underwent CABG X 3 by Dr. Prescott Gum on 03/11/19. He tolerated extubation with out difficulty but had confusion and was noted to have left sided weakness on 10/5 am.  CT head done revealing small acute cortical distal R-ACA territory infarct and progressive small vessel disease. CTA head/neck was negative for LVO, revealed new focus of calcification in R-ACA distal A2 segment which could indicated partially calcified thrombus, fetal origins PCA as well as incidental findings of large loculated component of left effusion and retrosternal 4.3X 2.0X 5.2 cm fluid collection favored to be seroma. He developed A fib with RVR as well as VT and was started on amiodarone on 10/5. Cardizem added for rate control and due to low BP maintained on milrinone due to fluid overload. Dr. Erlinda Hong recommends BP goal 110-140 to maintain adequate cerebral perfusion.   Follow up CT head without bleed therefore heparin added on 10/06 and transitioned to Eliquis by 10/08.  EEG done due to somnolence and was negative for seizures. He has had issues with urinary retention requiring I/O caths and foley replaced. He did develop progressive leucocytosis with WBC- 20.1 on 10/13 therefore started on doxycycline and Cipro added 10/15.  UCS negative and CXR done revealing stable bilateral effusion snd stable nodular density left mid lung possibly hematoma at side of prior chest tube. Leucocytosis resolving but 10/15 pm, he did have unresponsive episode with seizure type episode and 14.5 seconds  asystole felt to be vasovagal in nature. Amiodarone and BB d/c briefly due to bradycardia and ongoing A fib--as HR stable amiodarone resumed 10/18 and low dose BB resumed today.    He continue to require oxygen per [place] due to and is noted to be limited by left hemiparesis with shuffling gait and SOB with activity. CIR recommended due to functional decline.    Review of Systems  Constitutional: Negative for chills and fever.  HENT: Negative for hearing loss and tinnitus.   Eyes: Positive for blurred vision (needs eyes examined). Negative for double vision.  Respiratory: Positive for shortness of breath.   Cardiovascular: Negative for chest pain and palpitations.  Gastrointestinal: Negative for abdominal pain, heartburn and nausea.  Musculoskeletal: Negative for joint pain and myalgias.  Skin: Negative for rash.  Neurological: Positive for focal weakness. Negative for dizziness and headaches.  Psychiatric/Behavioral: The patient is not nervous/anxious and does not have insomnia.       Past Medical History:  Diagnosis Date  . Atrial fibrillation ([place])    a. dx 10/2017. b. s/p DCCV on 11/19/2017 with successful conversion to NSR but back in AFIB 3 weeks later  . CAD (coronary artery disease)    a. s/p PCI to LAD 2001. b. s/p stenting to the RCA and mid LAD December 2011, residual distal RCA disease treated medically. b. nuc 02/2013 abnormal with fixed defect seen in inferoapical, mid-inferior, and basal inferior walls, indicative of myocardial scar, no evidence of ischemia, EF 41% (Ef 55-60% by echo same time).  . Carotid artery disease ([place])  carotid Doppler course left side 5069% stenosis  . Dysrhythmia    AFib  . Habitual alcohol use   . Hyperlipidemia   . Hypertension   . Low back pain   . OSA on CPAP   . Osteoarthritis   . Polysubstance abuse ([place])    use including marijuana and vicodin,which he obtained from the street  . PVC's (premature ventricular contractions)    status post  Holter monitor normal sinus rhythm otherwise asymptomatic PVCs.  . Seasonal allergies    Past Surgical History:  Procedure Laterality Date  . CARDIOVERSION N/A 11/19/2017   Procedure: CARDIOVERSION;  Surgeon: Herminio Commons, MD;  Location[facility];  Service: Cardiovascular;  Laterality: N/A;  . CORONARY ANGIOPLASTY WITH STENT PLACEMENT  2011  . CORONARY ARTERY BYPASS GRAFT N/A 03/10/2019   Procedure: CORONARY ARTERY BYPASS GRAFTING (CABG)X 3 Using Left Internal Mammary Artery and Right Saphenous Vein Grafts;  Surgeon: Ivin Poot, MD;  Location: [facility];  Service: Open Heart Surgery;  Laterality: N/A;  . LEFT HEART CATH AND CORONARY ANGIOGRAPHY N/A 03/08/2019   Procedure: LEFT HEART CATH AND CORONARY ANGIOGRAPHY;  Surgeon: Belva Crome, MD;  Location: [facility];  Service: Cardiovascular;  Laterality: N/A;  . TEE WITHOUT CARDIOVERSION N/A 03/10/2019   Procedure: TRANSESOPHAGEAL ECHOCARDIOGRAM (TEE);  Surgeon: Prescott Gum, Collier Salina, MD;  Location: [facility];  Service: Open Heart Surgery;  Laterality: N/A;  . TONSILLECTOMY      Family History  Problem Relation Age of Onset  . Stroke Mother   . Other Mother        small bowel obstruction  . Heart attack Father   . Heart attack Brother     Social History:  Married. Works as a Editor, commissioning. He reports that he quit smoking about 19 years ago. His smoking use included cigarettes. He started smoking about 51 years ago. He has a 35.00 pack-year smoking history. He has never used smokeless tobacco. He reports current alcohol use of about 42.0 standard drinks of alcohol per week. He reports current drug use. Drug: Marijuana.     Allergies  Allergen Reactions  . Penicillins Hives and Rash    Has patient had a PCN reaction causing immediate rash, facial/tongue/throat swelling, SOB or lightheadedness with hypotension: Unknown Has patient had a PCN reaction causing severe rash involving mucus membranes or skin necrosis: Unknown  Has patient had a PCN reaction that required hospitalization: No Has patient had a PCN reaction occurring within the last 10 years: No If all of the above answers are "NO", then may proceed with Cephalosporin use.     Medications Prior to Admission  Medication Sig Dispense Refill  . allopurinol (ZYLOPRIM) 300 MG tablet Take 300 mg by mouth every morning.     Marland Kitchen apixaban (ELIQUIS) 5 MG TABS tablet Take 1 tablet (5 mg total) by mouth 2 (two) times daily. 180 tablet 1  . atorvastatin (LIPITOR) 80 MG tablet Take 1 tablet (80 mg total) by mouth daily. (Patient taking differently: Take 80 mg by mouth every evening. ) 90 tablet 2  . Cholecalciferol (VITAMIN D-3) 1000 UNITS CAPS Take 1,000 Units by mouth every morning.     . Cyanocobalamin (VITAMIN B-12) 2500 MCG SUBL Place 2,500 mcg under the tongue every morning.     . diclofenac (VOLTAREN) 75 MG EC tablet Take 75 mg by mouth 2 (two) times daily.    Marland Kitchen diltiazem (CARDIZEM CD) 120 MG 24 hr capsule TAKE 1 CAPSULE  BY MOUTH EVERY DAY (Patient taking differently: Take 120 mg by mouth every morning. ) 90 capsule 2  . fluticasone (FLONASE) 50 MCG/ACT nasal spray Place 2 sprays into the nose daily.      . folic acid (FOLVITE) 1 MG tablet TAKE ONE TABLET BY MOUTH EVERY DAY (Patient taking differently: Take 1 mg by mouth every morning. ) 30 tablet 6  . lisinopril (PRINIVIL,ZESTRIL) 40 MG tablet TAKE 1 TABLET BY MOUTH EVERY DAY (Patient taking differently: Take 40 mg by mouth every morning. ) 90 tablet 1  . loratadine (CLARITIN) 10 MG tablet Take 10 mg by mouth every morning.     . magnesium oxide (MAG-OX) 400 MG tablet Take 400 mg by mouth at bedtime.     . meclizine (ANTIVERT) 32 MG tablet Take 32 mg by mouth daily as needed for dizziness.     . nitroGLYCERIN (NITROSTAT) 0.4 MG SL tablet PLACE 1 TABLET (0.4 MG TOTAL) UNDER THE TONGUE EVERY 5 (FIVE) MINUTES AS NEEDED. (Patient taking differently: Place 0.4 mg under the tongue every 5 (five) minutes as needed for  chest pain. ) 25 tablet 3  . Omega-3 Fatty Acids (FISH OIL) 1200 MG CAPS Take 1,200 mg by mouth 2 (two) times daily.     . polyethylene glycol (MIRALAX / GLYCOLAX) packet Take 17 g by mouth 3 (three) times a week.     . triamterene-hydrochlorothiazide (MAXZIDE-25) 37.5-25 MG per tablet Take 1 tablet by mouth every morning.       Drug Regimen Review  Drug regimen was reviewed and remains appropriate with no significant issues identified  Home: Home Living Family/patient expects to be discharged to:: Inpatient rehab Living Arrangements: Spouse/significant other Available Help at Discharge: Family, Available 24 hours/day Type of Home: House Home Access: Other (comment)(threshold) Home Layout: One level, Other (Comment)(den with 2 steps up, on L side ) Bathroom Shower/Tub: Multimedia programmer: Standard Home Equipment: Grab bars - tub/shower, Radio producer - single point  Lives With: Spouse   Functional History: Prior Function Level of Independence: Independent Comments: working full time, driving   Functional Status:  Mobility: Bed Mobility Overal bed mobility: Needs Assistance Bed Mobility: Rolling, Sidelying to Sit, Sit to Sidelying Rolling: Max assist Sidelying to sit: Mod assist, HOB elevated Supine to sit: Max assist, HOB elevated Sit to sidelying: Mod assist General bed mobility comments: cues for technique and to hug pillow to chest, assist for rolling to R flexed L knee for him and turned hips, he got feet off bed and had help to lift trunk,  sit to side assist for legs onto bed Transfers Overall transfer level: Needs assistance Equipment used: Rolling walker (2 wheeled) Transfer via Lift Equipment: Stedy Transfers: Sit to/from [employer] to Stand: Min assist, Mod assist General transfer comment: used momentum, assist for L foot placement, cues for using legs not arms with hads on walker; mostly min A once increased lifting help Ambulation/Gait Ambulation/Gait  assistance: +2 safety/equipment, Mod assist, Min assist Gait Distance (Feet): 90 Feet(x 2) Assistive device: Rolling walker (2 wheeled) Gait Pattern/deviations: Step-to pattern, Step-through pattern, Decreased step length - left, Shuffle, Decreased dorsiflexion - left, Wide base of support General Gait Details: cues for breathing, L foot step length and foot clearance and to slow down due to quickly shuffling feet; stopped twice to stand and breathe and twice to sit in chair and rest in hallway Gait velocity: decreased Gait velocity interpretation: <1.8 ft/sec, indicate of risk for recurrent falls    ADL:  ADL Overall ADL's : Needs assistance/impaired Grooming: Minimal assistance, Oral care(+2 standing ) Grooming Details (indicate cue type and reason): washing face seated with setup assist, min assist to manage oral care in standing (cueing to use L hand as stabilizer) and min assist +2 fading to mod assist for standing balance when fatiguing  Upper Body Bathing: Maximal assistance, Sitting Lower Body Bathing: Maximal assistance, +2 for physical assistance, Bed level Upper Body Dressing : Total assistance, Sitting Lower Body Dressing: Total assistance, +2 for physical assistance, Bed level Toilet Transfer: Minimal assistance, Moderate assistance, +2 for safety/equipment, +2 for physical assistance, Stand-pivot, RW Toilet Transfer Details (indicate cue type and reason): simulated in room to recliner  Functional mobility during ADLs: Moderate assistance, +2 for physical assistance, Rolling walker, Cueing for safety, Cueing for sequencing General ADL Comments: pt remains limited by cognition, L sided weakness, sternal precautions, and cognition   Cognition: Cognition Overall Cognitive Status: Within Functional Limits for tasks assessed Arousal/Alertness: Awake/alert Orientation Level: Oriented X4 Attention: Selective Selective Attention: Impaired Selective Attention Impairment: Verbal basic  Memory: Impaired Memory Impairment: Retrieval deficit, Decreased recall of new information, Storage deficit Awareness: Impaired Awareness Impairment: Anticipatory impairment Problem Solving: Impaired Problem Solving Impairment: Functional basic Safety/Judgment: Impaired Cognition Arousal/Alertness: Awake/alert Behavior During Therapy: WFL for tasks assessed/performed Overall Cognitive Status: Within Functional Limits for tasks assessed Area of Impairment: Memory, Attention, Safety/judgement, Awareness, Problem solving, Following commands Orientation Level: Disoriented to, Place, Time(reports 2021, [facility] (able to correct hopsital)) Current Attention Level: Sustained Memory: Decreased short-term memory, Decreased recall of precautions Following Commands: Follows one step commands consistently, Follows multi-step commands inconsistently, Follows one step commands with increased time Safety/Judgement: Decreased awareness of safety, Decreased awareness of deficits Awareness: Emergent Problem Solving: Slow processing, Difficulty sequencing, Requires verbal cues, Requires tactile cues General Comments: able to transfer without vc for hand placement   Blood pressure 122/81, pulse 86, temperature 97.7 F (36.5 C), temperature source Oral, resp. rate (!) 24, height 5\' 8"  (1.727 m), weight 123.2 kg, SpO2 95 %. Physical Exam  Nursing note and vitals reviewed. Constitutional: He is oriented to person, place, and time. He appears well-developed and well-nourished. No distress. Nasal cannula in place.  Up in chair. 3 L oxygen per [place].   HENT:  Head: Normocephalic and atraumatic.  Nose: Nose normal.  Mouth/Throat: Oropharynx is clear and moist.  O2 via [place]  Eyes: Pupils are equal, round, and reactive to light. EOM are normal.  Neck: Normal range of motion. No tracheal deviation present. No thyromegaly present.  Cardiovascular: Normal rate, regular rhythm and intact distal pulses. Exam  reveals no friction rub.  No murmur heard. Respiratory: No respiratory distress.  Chest wall incision C/D/I  GI: He exhibits no distension. There is no abdominal tenderness. There is no rebound.  Musculoskeletal:        General: Edema present.     Comments: 1+ edema BLE   Neurological: He is alert and oriented to person, place, and time. He displays normal reflexes. No cranial nerve deficit. Coordination normal.  Speech clear. Minimal left central 7. LUE 4/5. LLE: 3/5 HF, 3+KE and 0-tr ADF/APF. RUE and RLE 4+/5. Normal sensation. Good insight and awareness.       Skin: He is not diaphoretic.  Skin cool LLE. Multiple scars and abrasions bilateral legs. LLE donor sites clean dry and intact  Psychiatric: He has a normal mood and affect. His behavior is normal. Judgment and thought content normal.    Results for orders placed  or performed during the hospital encounter of 03/07/19 (from the past 48 hour(s))  Glucose, capillary     Status: Abnormal   Collection Time: 03/25/19 11:08 AM  Result Value Ref Range   Glucose-Capillary 196 (H) 70 - 99 mg/dL   Comment 1 Notify RN    Comment 2 Document in Chart   Glucose, capillary     Status: Abnormal   Collection Time: 03/25/19  3:36 PM  Result Value Ref Range   Glucose-Capillary 184 (H) 70 - 99 mg/dL   Comment 1 Notify RN    Comment 2 Document in Chart   Glucose, capillary     Status: Abnormal   Collection Time: 03/25/19  9:06 PM  Result Value Ref Range   Glucose-Capillary 107 (H) 70 - 99 mg/dL   Comment 1 Notify RN    Comment 2 Document in Chart   Glucose, capillary     Status: None   Collection Time: 03/26/19  6:25 AM  Result Value Ref Range   Glucose-Capillary 83 70 - 99 mg/dL  Glucose, capillary     Status: Abnormal   Collection Time: 03/26/19 11:12 AM  Result Value Ref Range   Glucose-Capillary 146 (H) 70 - 99 mg/dL   Comment 1 Notify RN    Comment 2 Document in Chart   Glucose, capillary     Status: Abnormal   Collection  Time: 03/26/19  4:00 PM  Result Value Ref Range   Glucose-Capillary 146 (H) 70 - 99 mg/dL   Comment 1 Notify RN    Comment 2 Document in Chart   Glucose, capillary     Status: Abnormal   Collection Time: 03/26/19  9:22 PM  Result Value Ref Range   Glucose-Capillary 184 (H) 70 - 99 mg/dL  Glucose, capillary     Status: Abnormal   Collection Time: 03/27/19  6:28 AM  Result Value Ref Range   Glucose-Capillary 121 (H) 70 - 99 mg/dL   No results found.     Medical Problem List and Plan: 1.  Left hemiparesis and decreased mobility secondary to right ACA infarct after CABG  -admit to inpatient rehab  -order PRAFO LLE 2.  Antithrombotics: -DVT/anticoagulation:  Pharmaceutical: Other (comment)--Eliquis  -antiplatelet therapy: On low dose ASA.  3. Pain Management: Oxycodone prn 4. Mood: LCSW to follow for evaluation and support.   -antipsychotic agents: N/A 5. Neuropsych: This patient is capable of making decisions on his own behalf. 6. Skin/Wound Care: Monitor wound for healing. Continue protein supplement to help promote healing.   -PRAFO LLE  -barrier cream for buttock 7. Fluids/Electrolytes/Nutrition: strict I/O. Check lytes in am.  8. CAD s/p CABG: Continue sternal precautions. ON ASA and Lipitor.   9. HTN: BP goal 110-140 to allow for adequate cerebral perfusion.  10 Leukocytosis: Resolving --has completed 3 day course of Cipro and doxycyline on 10/17.  11. Diastolic heart failure: Strict I/O and daily weights. On Lasix daily with low dose BB.  12. Abnormal LFTs: Continue to monitor for now--recheck in am.  13. Malnutrition: Pre-albumin- 14.1 14. A fib: Monitor for recurrent bradycardia--check HR tid--on Eliquis bid and back on amiodarone and metoprolol daily 15. Persistent hyponatremia: Question SIADH v/s due to SSRI. Continue strict I/O. Check lytes in am, urine sodium and serum osmolality.  16. OSA/Hypoxia: PFTs with moderate to severe obstructive airway disease--now on  Dulera bid.  CPAP with oxygen bleed in for now. Will attempt to wean oxygen as able.  17. Urinary retention: DC foley and start  bladder training.        Bary Leriche, PA-C 03/27/2019

## 2019-03-22 NOTE — Progress Notes (Addendum)
Progress Note  Patient Name: Martin [name] Date of Encounter: 03/22/2019  Primary Cardiologist: Kate Sable, MD   Subjective  O/N events: Insurance refused CIR due to leukocytosis. Blood/urine culture drawn again.  Martin [name] was examined and evaluated at bedside this AM. He mentions not liking his new room in [place] because it is small. Otherwise denies any chest pain, palpitations, dyspnea. Mentions wanting to start rehab soon and get out of the hospital.  Inpatient Medications    Scheduled Meds: . allopurinol  300 mg Oral q morning - 10a  . amiodarone  400 mg Oral BID  . apixaban  5 mg Oral BID  . aspirin EC  81 mg Oral Daily  . atorvastatin  80 mg Oral q1800  . bisacodyl  10 mg Rectal Daily   Or  . bisacodyl  10 mg Oral Daily  . Chlorhexidine Gluconate Cloth  6 each Topical Daily  . diltiazem  120 mg Oral Daily  . docusate sodium  200 mg Oral Daily  . fluticasone  1 spray Each Nare Daily  . folic acid  1 mg Oral Daily  . furosemide  40 mg Intravenous Daily  . guaiFENesin  600 mg Oral BID  . insulin aspart  0-24 Units Subcutaneous TID AC & HS  . insulin detemir  10 Units Subcutaneous Daily  . metoprolol tartrate  50 mg Oral BID  . mometasone-formoterol  2 puff Inhalation BID  . mupirocin cream   Topical BID  . pantoprazole  40 mg Oral Daily  . potassium chloride  20 mEq Oral Daily  . sodium chloride flush  10-40 mL Intracatheter Q12H  . sodium chloride flush  3 mL Intravenous Q12H  . thiamine  100 mg Oral Daily  . vitamin B-12  100 mcg Oral Daily   Continuous Infusions: . sodium chloride     PRN Meds: ALPRAZolam, ipratropium-albuterol, magnesium citrate, ondansetron (ZOFRAN) IV, oxyCODONE, sodium chloride flush, sodium chloride flush, traMADol   Vital Signs    Vitals:   03/21/19 2150 03/21/19 2230 03/21/19 2306 03/22/19 0344  BP:   (!) 150/99 (!) 146/77  Pulse: 63  62   Resp:   18   Temp:   98.1 F (36.7 C) 98 F (36.7 C)  TempSrc:   Oral Oral   SpO2:  98% 90% 97%  Weight:    124.9 kg  Height:        Intake/Output Summary (Last 24 hours) at 03/22/2019 0725 Last data filed at 03/22/2019 0400 Gross per 24 hour  Intake 600 ml  Output 2185 ml  Net -1585 ml   Filed Weights   03/20/19 0500 03/21/19 0500 03/22/19 0344  Weight: 123.4 kg 122.9 kg 124.9 kg    Telemetry    A.fib rate controlled HR 60-70- Personally Reviewed  ECG    None from this am - Personally Reviewed  Physical Exam   Gen: Well-developed, obese, NAD HEENT: NCAT head, hearing intact, EOMI, Neck: supple, ROM intact, no JVD CV: Irregularly irregular, S1, S2 normal, No rubs, no murmurs, surgical scar intact w/o significant swelling, erythema or drainage. Pulm: CTAB no rales, no wheezes Abd: Distended, soft, pacer wires covered w/ bandaging Extm: Peripheral pulses intact, trace pitting edema Skin: Dry, Warm, normal turgor  Neuro: AAOx3, Improved 2/5 strength on LLE  Labs    Chemistry Recent Labs  Lab 03/17/19 0408 03/18/19 0426 03/20/19 0436 03/21/19 0500  NA 128* 131* 131* 129*  K 3.7 3.5 3.6 4.1  CL 90* 96* 94*  94*  CO2 25 25 26 26   GLUCOSE 144* 108* 105* 118*  BUN 21 18 20 16   CREATININE 0.86 0.71 0.87 0.70  CALCIUM 8.2* 7.9* 8.4* 8.5*  PROT 5.4* 5.2* 5.6*  --   ALBUMIN 2.2* 2.2* 2.5*  --   AST 50* 95* 72*  --   ALT 40 79* 82*  --   ALKPHOS 155* 130* 119  --   BILITOT 1.2 1.1 1.5*  --   GFRNONAA >60 >60 >60 >60  GFRAA >60 >60 >60 >60  ANIONGAP 13 10 11 9      Hematology Recent Labs  Lab 03/20/19 0436 03/21/19 0500 03/22/19 0425  WBC 15.4* 20.1* 17.6*  RBC 3.58* 3.84* 3.47*  HGB 11.8* 12.7* 11.4*  HCT 35.0* 37.0* 34.6*  MCV 97.8 96.4 99.7  MCH 33.0 33.1 32.9  MCHC 33.7 34.3 32.9  RDW 13.4 13.3 13.3  PLT 308 400 351    Cardiac EnzymesNo results for input(s): TROPONINI in the last 168 hours. No results for input(s): TROPIPOC in the last 168 hours.   BNP No results for input(s): BNP, PROBNP in the last 168 hours.    DDimer No results for input(s): DDIMER in the last 168 hours.   Radiology    Dg Chest Port 1 View  Result Date: 03/21/2019 CLINICAL DATA:  63 year old male with pleural effusion. EXAM: PORTABLE CHEST 1 VIEW COMPARISON:  Chest radiograph dated 03/20/2019 FINDINGS: Right-sided PICC in stable position. No significant interval change in the size of the pleural effusion or associated bibasilar atelectasis/infiltrate since the prior radiograph. No pneumothorax. Stable area of nodular density the left mid lung field as seen previously. Stable cardiomegaly. Median sternotomy wires and postsurgical changes of CABG. No acute osseous pathology. IMPRESSION: 1. No significant interval change in the size of the pleural effusions and associated bibasilar atelectasis/infiltrate. 2. Stable cardiomegaly. Electronically Signed   By: Anner Crete M.D.   On: 03/21/2019 16:01    Cardiac Studies   Echo 03/08/19: IMPRESSIONS  1. Left ventricular ejection fraction, by visual estimation, is 55 to 60%. The left ventricle has normal function. Normal left ventricular size. There is mildly increased left ventricular hypertrophy. Basal inferior akinesis. 2. Left ventricular diastolic Doppler parameters are consistent with impaired relaxation pattern of LV diastolic filling. 3. Global right ventricle has normal systolic function.The right ventricular size is normal. No increase in right ventricular wall thickness. 4. The aortic valve is tricuspid Aortic valve regurgitation is trivial by color flow Doppler. Mild aortic valve stenosis. Mean gradient 10 mmHg. 5. There is mild dilatation of the ascending aorta measuring 42 mm. 6. Left atrial size was moderately dilated. 7. Right atrial size was mildly dilated. 8. The tricuspid valve is normal in structure. Tricuspid valve regurgitation was not visualized by color flow Doppler. 9. The mitral valve is normal in structure. No evidence of mitral valve regurgitation. No  evidence of mitral stenosis. 10. TR signal is inadequate for assessing pulmonary artery systolic pressure. 11. The inferior vena cava is normal in size with greater than 50% respiratory variability, suggesting right atrial pressure of 3 mmHg.  Carotid Dopplers 03/09/2019: 1 to 39% ICA stenosis bilaterally.  Left heart cath 03/08/2019:  Severe three-vessel coronary calcification  Patent left main  Eccentric ostial 50 to 75% LAD with mid vessel 40 to 50% narrowing. Mid to distal LAD stent is widely patent.  Ramus intermedius contains proximal 90% stenosis.  Circumflex contains 99% proximal/ostial stenosis.  RCA is heavily calcified and stented. The artery is totally  occluded in the proximal to mid segment. Left-to-right collaterals are noted.  Mildly reduced LV systolic function with EF estimated to be 40%. LVEDP is 28 mmHg. Findings compatible with acute on chronic combined   Patient Profile     Mr.Zamor is a 63 yo M w/ PMH of prior CAD s/p pCI, PAF, HTN, OSA on CPAP presented with chest pain, found to have NSTEMI w/ multivessel dz requiring CABG. Hospital course complicated by acute ACA stroke.  Assessment & Plan    Leukocytosis Wbc 13.9->15.4->20.1->17.6 Now trending down. Afebrile. Vitals stable. No obvious source of infection. Urine culture NGTD. Chest X-ray w/o lobar consolidation. Cultures re-drawn yesterday. - F/u cultures  Acute ACA stroke Followed by neurology. PT/OT recommend CIR. LLE weak but improving. - C/w Eliquis - CIR per PT/OT  NSTEMI Multi-vessel CAD Cath with multi-vessel dz. S/p CABG post op day 12. No significant chest pain per pt. Post-op Echo w/ no significant different.. - C/w asa, atorvastatin - Start DAPT when cleared by CT surgery.  Diastolic heart failure TEE post Cabg w/ preserved EF. Urine output 550 yesterday Weight still trending down 123.4->122.9 Last known dry weight (122kg). Creatinine this am 0.70 - C/w furosemide 40 daily -  Daily weights, strict I&Os - Trend/replete K, Mag as appropriate   PAF Developed A.fib w/ RVR couple days ago. On amio, metop. Still irregular but not in RVR. Heart rate bordering on bradycardia after increase in metoprolol dose @ 60-70. Dilt stopped. - Need pacer wires removed - C/w metoprolol 50mg  BID - C/w oral amio 400mg  BID - C/w Eliquis  Urinary retention Foley now back in w/ good urine ouput. 2L output overnight - Reduce pain meds as much as possible  For questions or updates, please contact [place] Please consult [email or web address] for contact info under Cardiology/STEMI.   Signed, Gilberto Better, MD PGY-2, [facility] Pager: [phone] 03/22/2019, 7:25 AM    Attending attestation to follow  I have seen and examined the patient along with Gilberto Better, MD.  I have reviewed the chart, notes and new data.  I agree with his note.  Key new complaints: no CV complaints. Eager to start rehab Key examination changes: LLE paresis, irregular rhythm Key new findings / data: improving WBC  PLAN: Reduce amiodarone to 400 mg daily.  Sanda Klein, MD, [place] [phone] 03/22/2019, 8:38 AM

## 2019-03-23 LAB — GLUCOSE, CAPILLARY
Glucose-Capillary: 100 mg/dL — ABNORMAL HIGH (ref 70–99)
Glucose-Capillary: 179 mg/dL — ABNORMAL HIGH (ref 70–99)
Glucose-Capillary: 137 mg/dL — ABNORMAL HIGH (ref 70–99)
Glucose-Capillary: 157 mg/dL — ABNORMAL HIGH (ref 70–99)
Glucose-Capillary: 130 mg/dL — ABNORMAL HIGH (ref 70–99)

## 2019-03-23 MED ORDER — WITCH HAZEL-GLYCERIN EX PADS
MEDICATED_PAD | CUTANEOUS | Status: DC | PRN
  Administered 2019-03-23: 13:00:00 via TOPICAL
  Filled 2019-03-23: qty 100

## 2019-03-23 MED ORDER — FUROSEMIDE 40 MG PO TABS
40.0000 mg | ORAL_TABLET | Freq: Every day | ORAL | Status: DC
  Administered 2019-03-24 – 2019-03-27 (×4): 40 mg via ORAL
  Filled 2019-03-23 (×4): qty 1

## 2019-03-23 MED ORDER — OXYCODONE HCL 5 MG PO TABS
5.0000 mg | ORAL_TABLET | ORAL | Status: DC | PRN
  Administered 2019-03-24 – 2019-03-26 (×5): 5 mg via ORAL
  Filled 2019-03-23 (×5): qty 1

## 2019-03-23 MED ORDER — CIPROFLOXACIN HCL 500 MG PO TABS
500.0000 mg | ORAL_TABLET | Freq: Two times a day (BID) | ORAL | Status: AC
  Administered 2019-03-23 – 2019-03-25 (×5): 500 mg via ORAL
  Filled 2019-03-23 (×5): qty 1

## 2019-03-23 MED ORDER — AMIODARONE HCL 200 MG PO TABS
200.0000 mg | ORAL_TABLET | Freq: Every day | ORAL | Status: DC
  Administered 2019-03-23: 200 mg via ORAL
  Filled 2019-03-23: qty 1

## 2019-03-23 MED ORDER — DOXYCYCLINE HYCLATE 100 MG PO TABS
100.0000 mg | ORAL_TABLET | Freq: Two times a day (BID) | ORAL | Status: AC
  Administered 2019-03-23 – 2019-03-25 (×5): 100 mg via ORAL
  Filled 2019-03-23 (×6): qty 1

## 2019-03-23 MED ORDER — CIPROFLOXACIN IN D5W 400 MG/200ML IV SOLN
400.0000 mg | Freq: Two times a day (BID) | INTRAVENOUS | Status: DC
  Filled 2019-03-23: qty 200

## 2019-03-23 NOTE — Progress Notes (Signed)
STROKE TEAM PROGRESS NOTE   SUBJECTIVE (INTERVAL HISTORY) He is doing well. Still did not sleep well despite trazodone 50 mg last night. Not confused .EEG normal no seizure activity.BMP pending OBJECTIVE Temp:  [97.5 F (36.4 C)-97.7 F (36.5 C)] 97.7 F (36.5 C) (10/15 0900) Pulse Rate:  [59-65] 65 (10/15 0950) Cardiac Rhythm: Atrial fibrillation (10/15 1121) Resp:  [17-20] 20 (10/15 0950) BP: (110-136)/(69-79) 132/79 (10/15 0950) SpO2:  [91 %-100 %] 91 % (10/15 0950) Weight:  [123.4 kg] 123.4 kg (10/15 0046)  Recent Labs  Lab 03/22/19 1109 03/22/19 1621 03/22/19 2140 03/23/19 0554 03/23/19 1106  GLUCAP 172* 104* 103* 100* 179*   Recent Labs  Lab 03/17/19 0408 03/18/19 0426 03/20/19 0436 03/21/19 0500  NA 128* 131* 131* 129*  K 3.7 3.5 3.6 4.1  CL 90* 96* 94* 94*  CO2 25 25 26 26   GLUCOSE 144* 108* 105* 118*  BUN 21 18 20 16   CREATININE 0.86 0.71 0.87 0.70  CALCIUM 8.2* 7.9* 8.4* 8.5*  MG 1.8  --   --   --    Recent Labs  Lab 03/17/19 0408 03/18/19 0426 03/20/19 0436  AST 50* 95* 72*  ALT 40 79* 82*  ALKPHOS 155* 130* 119  BILITOT 1.2 1.1 1.5*  PROT 5.4* 5.2* 5.6*  ALBUMIN 2.2* 2.2* 2.5*   Recent Labs  Lab 03/18/19 0426 03/19/19 0429 03/20/19 0436 03/21/19 0500 03/22/19 0425  WBC 11.7* 13.9* 15.4* 20.1* 17.6*  HGB 11.6* 11.7* 11.8* 12.7* 11.4*  HCT 33.7* 34.2* 35.0* 37.0* 34.6*  MCV 96.8 97.7 97.8 96.4 99.7  PLT 260 282 308 400 351   No results for input(s): CKTOTAL, CKMB, CKMBINDEX, TROPONINI in the last 168 hours. No results for input(s): LABPROT, INR in the last 72 hours. No results for input(s): COLORURINE, LABSPEC, Frederic, GLUCOSEU, HGBUR, BILIRUBINUR, KETONESUR, PROTEINUR, UROBILINOGEN, NITRITE, LEUKOCYTESUR in the last 72 hours.  Invalid input(s): APPERANCEUR     Component Value Date/Time   CHOL 170 03/07/2019 2358   TRIG 218 (H) 03/07/2019 2358   HDL 41 03/07/2019 2358   CHOLHDL 4.1 03/07/2019 2358   VLDL 44 (H) 03/07/2019 2358    LDLCALC 85 03/07/2019 2358   Lab Results  Component Value Date   HGBA1C 5.6 03/09/2019      Component Value Date/Time   LABOPIA NONE DETECTED 05/20/2010 1326   COCAINSCRNUR NONE DETECTED 05/20/2010 1326   LABBENZ POSITIVE (A) 05/20/2010 1326   AMPHETMU NONE DETECTED 05/20/2010 1326   THCU NONE DETECTED 05/20/2010 1326   LABBARB  05/20/2010 1326    NONE DETECTED        DRUG SCREEN FOR MEDICAL PURPOSES ONLY.  IF CONFIRMATION IS NEEDED FOR ANY PURPOSE, NOTIFY LAB WITHIN 5 DAYS.        LOWEST DETECTABLE LIMITS FOR URINE DRUG SCREEN Drug Class       Cutoff (ng/mL) Amphetamine      1000 Barbiturate      200 Benzodiazepine   [id number] Tricyclics       [id number] Opiates          300 Cocaine          300 THC              50    No results for input(s): ETH in the last 168 hours.  I have personally reviewed the radiological images below and agree with the radiology interpretations.  Ct Angio Head W Or Wo Contrast  Result Date: 03/14/2019 CLINICAL DATA:  Anterior cerebral artery  territory infarct follow-up EXAM: CT ANGIOGRAPHY HEAD AND NECK TECHNIQUE: Multidetector CT imaging of the head and neck was performed using the standard protocol during bolus administration of intravenous contrast. Multiplanar CT image reconstructions and MIPs were obtained to evaluate the vascular anatomy. Carotid stenosis measurements (when applicable) are obtained utilizing NASCET criteria, using the distal internal carotid diameter as the denominator. CONTRAST:  84mL OMNIPAQUE IOHEXOL 350 MG/ML SOLN COMPARISON:  CTA head neck 10/23/2017 Head CT 03/13/2019 FINDINGS: CTA NECK FINDINGS SKELETON: There is no bony spinal canal stenosis. No lytic or blastic lesion. OTHER NECK: Status post recent coronary artery bypass graft with median sternotomy. There is a retrosternal fluid collection that measures 4.3 x 2.0 x 5.2 cm. UPPER CHEST: Bilateral pleural effusions. There is a large loculated component of the left effusion. AORTIC  ARCH: There is moderate calcific atherosclerosis of the aortic arch. There is no aneurysm, dissection or hemodynamically significant stenosis of the visualized portion of the aorta. Normal variant aortic arch branching pattern with the brachiocephalic and left common carotid arteries sharing a common origin. The visualized proximal subclavian arteries are widely patent. RIGHT CAROTID SYSTEM: There is mild narrowing of the proximal common carotid artery due to noncalcified plaque. There is circumferential atherosclerotic calcification at the carotid bifurcation and proximal internal carotid artery resulting in less than 50% stenosis. The distal internal carotid artery is widely patent. Proximal external carotid artery is normal. LEFT CAROTID SYSTEM: Mild common carotid atherosclerosis. Circumferential calcification of the proximal ICA without hemodynamically significant stenosis. Normal distal ICA aside from minor calcification. There is mild narrowing of the origin of the external carotid artery due to noncalcified plaque. VERTEBRAL ARTERIES: Right dominant configuration. Poor visualization of the right origin. Left origin is patent. There is no dissection, occlusion or flow-limiting stenosis to the skull base (V1-V3 segments). CTA HEAD FINDINGS POSTERIOR CIRCULATION: --Vertebral arteries: Normal right V4 segment. There is loss of opacification of the left V4 segment distal to the PICA origin. --Posterior inferior cerebellar arteries (PICA): Patent origins from the vertebral arteries. --Anterior inferior cerebellar arteries (AICA): Right dominant --Basilar artery: Diminutive --Superior cerebellar arteries: Normal. --Posterior cerebral arteries: Normal. Both are predominantly supplied by the posterior communicating arteries (p-comm). ANTERIOR CIRCULATION: --Intracranial internal carotid arteries: Atherosclerotic calcification of the internal carotid arteries at the skull base without hemodynamically significant  stenosis. --Anterior cerebral arteries (ACA): There is a new focus of calcification in the distal right A2 segment (series 12, image 22) that may indicate partially calcified thrombus. However, the remainder of the vessel is normally opacified. Calcification in the left A2 segment is unchanged. Both A1 segments are present. Patent anterior communicating artery (a-comm). --Middle cerebral arteries (MCA): Normal. VENOUS SINUSES: As permitted by contrast timing, patent. ANATOMIC VARIANTS: Bilateral fetal origins of the posterior cerebral arteries. Review of the MIP images confirms the above findings. IMPRESSION: 1. No emergent large vessel occlusion or hemodynamically significant stenosis. 2. New focus of calcification in the right anterior cerebral artery distal A2 segment (s12:i22), which may indicate partially calcified thrombus. The vessel is patent distal to this, however. 3. Decreased opacification of the distal V4 segment of the left vertebral artery, distal to the PICA origin, age-indeterminate. Bilateral fetal origins of the posterior cerebral arteries with diminutive vertebrobasilar system. 4. Status post recent coronary artery bypass graft with retrosternal fluid collection that measures 4.3 x 2.0 x 5.2 cm. In the immediate postoperative setting, this is favored to be a seroma. 5. Bilateral pleural effusions with large loculated component of the left effusion. 6.  Aortic Atherosclerosis (ICD10-I70.0). Electronically Signed   By: Ulyses Jarred M.D.   On: 03/14/2019 19:36   Dg Chest 1 View  Result Date: 03/10/2019 CLINICAL DATA:  Status post CABG, rule out pneumo. EXAM: CHEST  1 VIEW COMPARISON:  03/07/2019 FINDINGS: Now status post median sternotomy and CABG. Swan-Ganz catheter in proximal right pulmonary artery. A transesophageal echocardiographic pro passes to the level of the T10 vertebral body. Seven intact sternal wires are noted. Left-sided chest tube in place without signs of pneumothorax on supine  radiograph. Cardiomediastinal contours with mild widening superiorly likely secondary to postoperative change otherwise unchanged accounting for low lung volumes. There is a single tube in the midline projecting over sternal wires extending to the level of the sternal manubrium. This may represent mediastinal drain. IMPRESSION: 1. Status post CABG with left chest tube without visible pneumothorax. 2. Swan-Ganz catheter and transesophageal echocardiographic probe in place. 3. Midline drainage tubing projects over the mediastinum, potentially mediastinal drain, clinical correlation suggested. Electronically Signed   By: Zetta Bills M.D.   On: 03/10/2019 13:30   Dg Chest 2 View  Result Date: 03/20/2019 CLINICAL DATA:  Post CABG EXAM: CHEST - 2 VIEW COMPARISON:  Radiograph 03/18/2019 FINDINGS: Right PICC terminates at the superior cavoatrial junction. Postsurgical changes related to prior CABG including intact and aligned sternotomy wires and multiple surgical clips projecting over the mediastinum. Stable postoperative mediastinal widening. Bandlike areas of opacity likely reflect atelectatic change. Suspect at least small bilateral effusions. Nodular density in the left mid lung may reflect a hematoma at the site of prior chest tube placement. No acute osseous or soft tissue abnormality. IMPRESSION: Stable appearance of bilateral effusions and basilar atelectasis. Stable nodular density in the left mid lung, possibly hematoma at site of prior chest tube. Electronically Signed   By: Lovena Le M.D.   On: 03/20/2019 05:59   Dg Chest 2 View  Result Date: 03/07/2019 CLINICAL DATA:  Chest pain for 2 weeks. EXAM: CHEST - 2 VIEW COMPARISON:  10/28/2017 and prior radiographs FINDINGS: Cardiomegaly and LEFT basilar scarring again noted. There is no evidence of focal airspace disease, pulmonary edema, suspicious pulmonary nodule/mass, pleural effusion, or pneumothorax. No acute bony abnormalities are identified.  IMPRESSION: Cardiomegaly without evidence of acute cardiopulmonary disease. Electronically Signed   By: Margarette Canada M.D.   On: 03/07/2019 14:30   Ct Head Wo Contrast  Result Date: 03/16/2019 CLINICAL DATA:  63 year old male with altered mental status. Right ACA territory infarct. Episode of unresponsiveness. EXAM: CT HEAD WITHOUT CONTRAST TECHNIQUE: Contiguous axial images were obtained from the base of the skull through the vertex without intravenous contrast. COMPARISON:  CT head and CTA head and neck 03/14/2019. FINDINGS: Brain: Cytotoxic edema in the distal right ACA territory on series 3, image 71 has not significantly changed. No associated hemorrhage or mass effect. There is additional patchy hypodensity in the right MCA/ACA watershed white matter on series 3, image 25 which is stable. Lesser left hemisphere white matter hypodensity. No midline shift, mass effect, or evidence of intracranial mass lesion. No ventriculomegaly. No new cortically based infarct. Vascular: Calcified atherosclerosis at the skull base. No suspicious intracranial vascular hyperdensity. a Skull: No acute osseous abnormality identified. Sinuses/Orbits: Mildly increased paranasal sinus mucosal thickening and opacity. Tympanic cavities and mastoids remain clear. Other: No acute orbit or scalp soft tissue findings. IMPRESSION: 1. Stable CT appearance of the distal right ACA and right ACA/MCA watershed ischemia. No associated hemorrhage or mass effect. Underlying chronic white matter disease. 2.  No new intracranial abnormality. Electronically Signed   By: Genevie Ann M.D.   On: 03/16/2019 13:38   Ct Head Wo Contrast  Result Date: 03/14/2019 CLINICAL DATA:  Stroke, follow-up. EXAM: CT HEAD WITHOUT CONTRAST TECHNIQUE: Contiguous axial images were obtained from the base of the skull through the vertex without intravenous contrast. COMPARISON:  CT head without contrast 03/13/2019 FINDINGS: Brain: Progressive hypoattenuation is noted within  the right cingulate gyrus. Subjacent white matter hypoattenuation is progressed as well. No acute hemorrhage is noted. Precentral gyrus is intact. Periventricular white matter changes are otherwise stable. No significant extraaxial fluid collection is present. The brainstem and cerebellum are within normal limits. Vascular: Atherosclerotic changes are noted within the cavernous internal carotid arteries. There is no hyperdense vessel. Skull: Calvarium is intact. No focal lytic or blastic lesions are present. Sinuses/Orbits: Mild mucosal thickening is present within ethmoid air cells. No fluid levels are present. The globes and orbits are within normal limits. IMPRESSION: 1. Progressive right ACA territory nonhemorrhagic infarct. 2. Progressive adjacent white matter infarct. 3. Atherosclerosis. 4. Diffuse small vessel disease. Electronically Signed   By: San Morelle M.D.   On: 03/14/2019 12:12   Ct Head Wo Contrast  Result Date: 03/13/2019 CLINICAL DATA:  Generalized muscle weakness. Left-sided weakness EXAM: CT HEAD WITHOUT CONTRAST TECHNIQUE: Contiguous axial images were obtained from the base of the skull through the vertex without intravenous contrast. COMPARISON:  Brain MRI 04/18/2018 FINDINGS: Brain: Small area of cytotoxic appearance in the parasagittal and posterior right frontal region, distal ACA territory. No hemorrhage, hydrocephalus, or masslike finding. Low-density in the cerebral white matter that is fairly symmetric and likely chronic microvascular ischemia but progressed from MR. Small cortically based calcification along the right hemisphere, nonspecific in isolation. Vascular: Atherosclerotic calcification. Skull: Negative. Sinuses/Orbits: Negative. Other: These results were communicated to Dr. Lorraine Lax at 6:52 amon 10/5/2020by text page via the [facility] messaging system. IMPRESSION: 1. Small acute cortical infarct in the distal right ACA territory. 2. Cerebral white matter low-density that  is mild and symmetric but may have progressed from 2019, question right watershed white matter infarcts as well. Electronically Signed   By: Monte Fantasia M.D.   On: 03/13/2019 06:58   Ct Angio Neck W Or Wo Contrast  Result Date: 03/14/2019 CLINICAL DATA:  Anterior cerebral artery territory infarct follow-up EXAM: CT ANGIOGRAPHY HEAD AND NECK TECHNIQUE: Multidetector CT imaging of the head and neck was performed using the standard protocol during bolus administration of intravenous contrast. Multiplanar CT image reconstructions and MIPs were obtained to evaluate the vascular anatomy. Carotid stenosis measurements (when applicable) are obtained utilizing NASCET criteria, using the distal internal carotid diameter as the denominator. CONTRAST:  45mL OMNIPAQUE IOHEXOL 350 MG/ML SOLN COMPARISON:  CTA head neck 10/23/2017 Head CT 03/13/2019 FINDINGS: CTA NECK FINDINGS SKELETON: There is no bony spinal canal stenosis. No lytic or blastic lesion. OTHER NECK: Status post recent coronary artery bypass graft with median sternotomy. There is a retrosternal fluid collection that measures 4.3 x 2.0 x 5.2 cm. UPPER CHEST: Bilateral pleural effusions. There is a large loculated component of the left effusion. AORTIC ARCH: There is moderate calcific atherosclerosis of the aortic arch. There is no aneurysm, dissection or hemodynamically significant stenosis of the visualized portion of the aorta. Normal variant aortic arch branching pattern with the brachiocephalic and left common carotid arteries sharing a common origin. The visualized proximal subclavian arteries are widely patent. RIGHT CAROTID SYSTEM: There is mild narrowing of the proximal common carotid artery due  to noncalcified plaque. There is circumferential atherosclerotic calcification at the carotid bifurcation and proximal internal carotid artery resulting in less than 50% stenosis. The distal internal carotid artery is widely patent. Proximal external carotid  artery is normal. LEFT CAROTID SYSTEM: Mild common carotid atherosclerosis. Circumferential calcification of the proximal ICA without hemodynamically significant stenosis. Normal distal ICA aside from minor calcification. There is mild narrowing of the origin of the external carotid artery due to noncalcified plaque. VERTEBRAL ARTERIES: Right dominant configuration. Poor visualization of the right origin. Left origin is patent. There is no dissection, occlusion or flow-limiting stenosis to the skull base (V1-V3 segments). CTA HEAD FINDINGS POSTERIOR CIRCULATION: --Vertebral arteries: Normal right V4 segment. There is loss of opacification of the left V4 segment distal to the PICA origin. --Posterior inferior cerebellar arteries (PICA): Patent origins from the vertebral arteries. --Anterior inferior cerebellar arteries (AICA): Right dominant --Basilar artery: Diminutive --Superior cerebellar arteries: Normal. --Posterior cerebral arteries: Normal. Both are predominantly supplied by the posterior communicating arteries (p-comm). ANTERIOR CIRCULATION: --Intracranial internal carotid arteries: Atherosclerotic calcification of the internal carotid arteries at the skull base without hemodynamically significant stenosis. --Anterior cerebral arteries (ACA): There is a new focus of calcification in the distal right A2 segment (series 12, image 22) that may indicate partially calcified thrombus. However, the remainder of the vessel is normally opacified. Calcification in the left A2 segment is unchanged. Both A1 segments are present. Patent anterior communicating artery (a-comm). --Middle cerebral arteries (MCA): Normal. VENOUS SINUSES: As permitted by contrast timing, patent. ANATOMIC VARIANTS: Bilateral fetal origins of the posterior cerebral arteries. Review of the MIP images confirms the above findings. IMPRESSION: 1. No emergent large vessel occlusion or hemodynamically significant stenosis. 2. New focus of calcification  in the right anterior cerebral artery distal A2 segment (s12:i22), which may indicate partially calcified thrombus. The vessel is patent distal to this, however. 3. Decreased opacification of the distal V4 segment of the left vertebral artery, distal to the PICA origin, age-indeterminate. Bilateral fetal origins of the posterior cerebral arteries with diminutive vertebrobasilar system. 4. Status post recent coronary artery bypass graft with retrosternal fluid collection that measures 4.3 x 2.0 x 5.2 cm. In the immediate postoperative setting, this is favored to be a seroma. 5. Bilateral pleural effusions with large loculated component of the left effusion. 6. Aortic Atherosclerosis (ICD10-I70.0). Electronically Signed   By: Ulyses Jarred M.D.   On: 03/14/2019 19:36   Dg Chest Port 1 View  Result Date: 03/21/2019 CLINICAL DATA:  63 year old male with pleural effusion. EXAM: PORTABLE CHEST 1 VIEW COMPARISON:  Chest radiograph dated 03/20/2019 FINDINGS: Right-sided PICC in stable position. No significant interval change in the size of the pleural effusion or associated bibasilar atelectasis/infiltrate since the prior radiograph. No pneumothorax. Stable area of nodular density the left mid lung field as seen previously. Stable cardiomegaly. Median sternotomy wires and postsurgical changes of CABG. No acute osseous pathology. IMPRESSION: 1. No significant interval change in the size of the pleural effusions and associated bibasilar atelectasis/infiltrate. 2. Stable cardiomegaly. Electronically Signed   By: Anner Crete M.D.   On: 03/21/2019 16:01   Dg Chest Port 1 View  Result Date: 03/18/2019 CLINICAL DATA:  Status post coronary bypass graft. EXAM: PORTABLE CHEST 1 VIEW COMPARISON:  March 16, 2019. FINDINGS: Stable cardiomegaly. Status post coronary bypass graft. Right-sided PICC line is unchanged in position. Stable left perihilar density is noted concerning for atelectasis. No pneumothorax is noted.  Stable bilateral pleural effusions are noted with associated atelectasis. Bony thorax  is unremarkable. IMPRESSION: Stable bilateral pleural effusions with associated atelectasis. Electronically Signed   By: Marijo Conception M.D.   On: 03/18/2019 09:58   Dg Chest Port 1 View  Result Date: 03/16/2019 CLINICAL DATA:  63 year old male with shortness of breath, pleural effusion. Status post CABG postoperative day 6. EXAM: PORTABLE CHEST 1 VIEW COMPARISON:  Portable chest 03/15/2019 and earlier. FINDINGS: Portable AP semi upright view at 0527 hours. Right IJ introducer sheath is removed. Right PICC line remains. Stable cardiomegaly and lung volumes. No pneumothorax or pulmonary edema. Veiling lung base opacity and dense retrocardiac opacity. Superimposed nodular probable segmental atelectasis near the left hilum. No acute osseous abnormality identified. IMPRESSION: 1. Right IJ introducer sheath removed.  Stable right PICC line. 2. Evidence of bilateral pleural effusions with retrocardiac and left perihilar atelectasis. Electronically Signed   By: Genevie Ann M.D.   On: 03/16/2019 08:17   Dg Chest Port 1 View  Result Date: 03/15/2019 CLINICAL DATA:  Status post coronary artery bypass graft. EXAM: PORTABLE CHEST 1 VIEW COMPARISON:  Radiograph of March 14, 2019. FINDINGS: Stable cardiomegaly. Status post coronary bypass graft. Right-sided PICC line is unchanged in position. Stable bibasilar atelectasis is noted with associated pleural effusions. Stable left midlung opacity is noted most consistent with small hematoma related to prior chest tube. Bony thorax is unremarkable. IMPRESSION: Stable bibasilar atelectasis is noted with associated pleural effusions. Stable rounded density seen in left midlung most consistent with hematoma secondary to prior chest tube. Electronically Signed   By: Marijo Conception M.D.   On: 03/15/2019 08:33   Dg Chest Port 1 View  Result Date: 03/14/2019 CLINICAL DATA:  Short of breath.   Status post CABG surgery. EXAM: PORTABLE CHEST 1 VIEW COMPARISON:  03/13/2019 and earlier exams. FINDINGS: Left chest tube has been removed. There is opacity corresponding to the chest tube tract consistent with fluid/atelectasis. No pneumothorax. Persistent opacity noted at the bases consistent with a combination of pleural effusions and atelectasis. No convincing pulmonary edema. No mediastinal widening.  Cardiac silhouette is enlarged but stable. Stable right internal jugular Cordis. IMPRESSION: 1. Status post removal of the left chest tube.  No pneumothorax. 2. Persistent lung base opacity consistent with a combination of pleural effusions and atelectasis. Electronically Signed   By: Lajean Manes M.D.   On: 03/14/2019 08:23   Dg Chest Port 1 View  Result Date: 03/13/2019 CLINICAL DATA:  Chest tube placement EXAM: PORTABLE CHEST 1 VIEW COMPARISON:  03/12/2019 FINDINGS: The left-sided chest tube is stable in positioning. The right-sided central venous catheter is stable in positioning. The heart size remains significantly enlarged. The patient is status post prior median sternotomy. There are bilateral pleural effusions with adjacent atelectasis. There is no pneumothorax. There is generalized volume overload. IMPRESSION: 1. Lines and tubes as above. 2. No pneumothorax. 3. Cardiomegaly with persistent bilateral pleural effusions and adjacent atelectasis. Electronically Signed   By: Constance Holster M.D.   On: 03/13/2019 08:02   Dg Chest Port 1 View  Result Date: 03/12/2019 CLINICAL DATA:  Status post surgery. EXAM: PORTABLE CHEST 1 VIEW COMPARISON:  03/11/2019 FINDINGS: Interval removal of the Swan-Ganz catheter. Right jugular central venous sheath remains in place. Left-sided chest tube in unchanged position. No left pneumothorax. Mild left basilar atelectasis. Right basilar airspace disease likely reflecting atelectasis with a small right pleural effusion. No right pneumothorax. Stable  cardiomediastinal silhouette. Recent CABG. IMPRESSION: 1. Left-sided chest tube in unchanged position. No left pneumothorax. Mild left basilar atelectasis. 2.  Right basilar atelectasis with a small right pleural effusion. Electronically Signed   By: Kathreen Devoid   On: 03/12/2019 09:11   Dg Chest Port 1 View  Result Date: 03/11/2019 CLINICAL DATA:  CABG x3 03/10/2019. EXAM: PORTABLE CHEST 1 VIEW COMPARISON:  03/10/2019 FINDINGS: Right IJ Swan-Ganz catheter unchanged with tip over the right main pulmonary artery. Left-sided chest tube unchanged. Mediastinal drain unchanged. Lungs are adequately inflated with very minimal bibasilar density likely small amount of bilateral pleural fluid/atelectasis. Mild stable cardiomegaly. Remainder of the exam is unchanged. IMPRESSION: Suggestion of small amount of bilateral pleural fluid/atelectasis. Stable cardiomegaly. Tubes and lines as described. Electronically Signed   By: Marin Olp M.D.   On: 03/11/2019 08:46   Dg Chest Port 1 View  Result Date: 03/10/2019 CLINICAL DATA:  S/p CABG x 3, chest tube in place, pt intubated. EXAM: PORTABLE CHEST 1 VIEW COMPARISON:  Chest radiograph 03/10/2019 at 1:09 p.m. FINDINGS: Stable enlarged cardiomediastinal contours status post median sternotomy. Interval intubation with endotracheal tube tip projecting between the thoracic inlet and carina. Stable remaining support apparatus including a PA catheter, enteric tube and left chest tube. The lungs are well aerated. No new focal pulmonary opacity. No pneumothorax or large pleural effusion. IMPRESSION: 1. Interval intubation with endotracheal tube tip projecting between the thoracic inlet and carina. 2. Postoperative appearance of the chest. Electronically Signed   By: Audie Pinto M.D.   On: 03/10/2019 15:08   Dg Chest Port 1v Same Day  Result Date: 03/14/2019 CLINICAL DATA:  Bedside PICC placement. EXAM: PORTABLE CHEST 1 VIEW 1:04 p.m.: COMPARISON:  Portable chest x-ray  earlier same day at 5:29 a.m. and previously. FINDINGS: RIGHT arm PICC tip projects at or near the cavoatrial junction. RIGHT jugular introducer sheath tip projects at the junction of the jugular and innominate veins. Prior sternotomy for CABG. Cardiac silhouette markedly enlarged. Normal pulmonary vascularity. Bibasilar atelectasis, unchanged since earlier in the day. Round opacity in the LEFT UPPER LOBE at the site of the prior chest tube, unchanged, presumably a small hematoma. No new pulmonary parenchymal abnormalities. IMPRESSION: 1. RIGHT arm PICC tip projects at or near the cavoatrial junction. 2. Remaining support apparatus satisfactory. 3. Stable bibasilar atelectasis. 4. Stable rounded opacity in the LEFT upper lobe at the site of the prior chest tube, presumably a small hematoma. 5. No new abnormalities. Electronically Signed   By: Evangeline Dakin M.D.   On: 03/14/2019 13:18   Vas US Doppler Pre Cabg  Result Date: 03/09/2019 PREOPERATIVE VASCULAR EVALUATION  Indications:      Pre CABG evaluation. Risk Factors:     Hypertension, hyperlipidemia, coronary artery disease. Other Factors:    Atrial fibrillation. Comparison Study: Prior study done 05/23/16 is available for comparison Performing Technologist: Birdena Crandall, [place] Technologist: Sharion Dove RVS  Examination Guidelines: A complete evaluation includes B-mode imaging, spectral Doppler, color Doppler, and power Doppler as needed of all accessible portions of each vessel. Bilateral testing is considered an integral part of a complete examination. Limited examinations for reoccurring indications may be performed as noted.  Right Carotid Findings: +----------+--------+--------+--------+--------+------------------+           PSV cm/sEDV cm/sStenosisDescribeComments           +----------+--------+--------+--------+--------+------------------+ CCA Prox  46      6               calcific                    +----------+--------+--------+--------+--------+------------------+  CCA Distal42      7                       intimal thickening +----------+--------+--------+--------+--------+------------------+ ICA Prox  46      14              calcificShadowing          +----------+--------+--------+--------+--------+------------------+ ICA Distal52      11                                         +----------+--------+--------+--------+--------+------------------+ ECA       108     11                                         +----------+--------+--------+--------+--------+------------------+ Portions of this table do not appear on this page. +----------+--------+-------+--------+------------+           PSV cm/sEDV cmsDescribeArm Pressure +----------+--------+-------+--------+------------+ Subclavian161                    145          +----------+--------+-------+--------+------------+ +---------+--------+--+--------+--+ VertebralPSV cm/s34EDV cm/s11 +---------+--------+--+--------+--+ Left Carotid Findings: +----------+--------+--------+--------+------------+------------------+           PSV cm/sEDV cm/sStenosisDescribe    Comments           +----------+--------+--------+--------+------------+------------------+ CCA Prox  55      13              homogeneous                    +----------+--------+--------+--------+------------+------------------+ CCA Distal57      12                          intimal thickening +----------+--------+--------+--------+------------+------------------+ ICA Prox  66      22              heterogenous                   +----------+--------+--------+--------+------------+------------------+ ICA Distal77      21                          tortuous           +----------+--------+--------+--------+------------+------------------+ ECA       184     19                                              +----------+--------+--------+--------+------------+------------------+ +----------+--------+--------+--------+------------+ SubclavianPSV cm/sEDV cm/sDescribeArm Pressure +----------+--------+--------+--------+------------+           220                     145          +----------+--------+--------+--------+------------+ +---------+--------+--+--------+-+ VertebralPSV cm/s33EDV cm/s7 +---------+--------+--+--------+-+  ABI Findings: +---------+------------------+-----+----------+--------+ Right    Rt Pressure (mmHg)IndexWaveform  Comment  +---------+------------------+-----+----------+--------+ Brachial 145                    triphasic          +---------+------------------+-----+----------+--------+ PTA      104  0.72 monophasic         +---------+------------------+-----+----------+--------+ DP       117               0.81 monophasic         +---------+------------------+-----+----------+--------+ Great Toe76                0.52                    +---------+------------------+-----+----------+--------+ +---------+------------------+-----+---------+-------+ Left     Lt Pressure (mmHg)IndexWaveform Comment +---------+------------------+-----+---------+-------+ Brachial 145                    triphasic        +---------+------------------+-----+---------+-------+ PTA      186               1.28 biphasic         +---------+------------------+-----+---------+-------+ DP       175               1.21 biphasic         +---------+------------------+-----+---------+-------+ Great Toe118               0.81                  +---------+------------------+-----+---------+-------+ +-------+---------------+----------------+ ABI/TBIToday's ABI/TBIPrevious ABI/TBI +-------+---------------+----------------+ Right  0.81/0.52      No previous      +-------+---------------+----------------+ Left   1.28/0.81      No previous       +-------+---------------+----------------+  Right Doppler Findings: +-----------+--------+-----+---------+--------+ Site       PressureIndexDoppler  Comments +-----------+--------+-----+---------+--------+ Brachial   145          triphasic         +-----------+--------+-----+---------+--------+ Radial                  triphasic         +-----------+--------+-----+---------+--------+ Ulnar                   triphasic         +-----------+--------+-----+---------+--------+ Palmar Arch                      normal   +-----------+--------+-----+---------+--------+  Left Doppler Findings: +-----------+--------+-----+---------+--------+ Site       PressureIndexDoppler  Comments +-----------+--------+-----+---------+--------+ Brachial   145          triphasic         +-----------+--------+-----+---------+--------+ Radial                  triphasic         +-----------+--------+-----+---------+--------+ Ulnar                   triphasic         +-----------+--------+-----+---------+--------+ Palmar Arch                      normal   +-----------+--------+-----+---------+--------+  Summary: Right Carotid: Velocities in the right ICA are consistent with a 1-39% stenosis.                No change since study done 05/23/16. Left Carotid: Velocities in the left ICA are consistent with a 1-39% stenosis.               No change since study done 05/23/16. Vertebrals:  Bilateral vertebral arteries demonstrate antegrade flow. Subclavians: Normal flow hemodynamics were seen in bilateral subclavian  arteries. Right ABI: Resting right ankle-brachial index indicates mild right lower extremity arterial disease. The right toe-brachial index is abnormal. Left ABI: Resting left ankle-brachial index is within normal range. No evidence of significant left lower extremity arterial disease. The left toe-brachial index is normal. Right Upper Extremity: Doppler waveforms remain  within normal limits with right radial compression. Doppler waveforms remain within normal limits with right ulnar compression. Left Upper Extremity: Doppler waveforms remain within normal limits with left radial compression. Doppler waveforms remain within normal limits with left ulnar compression.  Electronically signed by Servando Snare MD on 03/09/2019 at 5:45:33 PM.    Final    Ct Head Code Stroke Wo Contrast  Result Date: 03/22/2019 CLINICAL DATA:  Code stroke. Altered level of consciousness (LOC), unexplained. Confusion. EXAM: CT HEAD WITHOUT CONTRAST TECHNIQUE: Contiguous axial images were obtained from the base of the skull through the vertex without intravenous contrast. COMPARISON:  Head CT 03/16/2019, CT angiogram head 03/14/2019, brain MRI 04/18/2018, CT angiogram head/neck 10/23/2017 FINDINGS: Brain: Unchanged appearance of a subacute right ACA as well as ACA/MCA watershed territory infarction. No new demarcated cortical infarction is identified. No evidence of intracranial hemorrhage. No midline shift or extra-axial fluid collection. Stable generalized parenchymal atrophy and chronic small vessel ischemic disease. Partially empty sella turcica. Vascular: No hyperdense vessel.  Atherosclerotic calcification. Skull: No calvarial fracture or suspicious osseous lesion. Sinuses/Orbits: Minimal scattered paranasal sinus mucosal thickening at the imaged levels. Small amount of frothy secretions within a posterior left ethmoid air cell. No significant mastoid effusion. These results were communicated to Dr. Leonie Man At 5:06 pmon 10/14/2020by text page via the [facility] messaging system. IMPRESSION: 1. Unchanged appearance of subacute right ACA as well as ACA/MCA watershed territory infarcts. No new acute infarction is demonstrated. No intracranial hemorrhage. 2. Stable atrophy and chronic small vessel ischemic disease. Electronically Signed   By: Kellie Simmering   On: 03/22/2019 17:13   Vas [place] Transcranial  Doppler  Result Date: 03/14/2019  Transcranial Doppler Indications: ICH. History: Post op CABG 03/10/19. Hx TIA. New onset left side weakness 03/12/19. Limitations for diagnostic windows: Unable to insonate occipital window. Comparison Study: no prior Performing Technologist: June Leap RDMS, RVT  Examination Guidelines: A complete evaluation includes B-mode imaging, spectral Doppler, color Doppler, and power Doppler as needed of all accessible portions of each vessel. Bilateral testing is considered an integral part of a complete examination. Limited examinations for reoccurring indications may be performed as noted.  +----------+-------------+----------+-----------+--------------+ RIGHT TCD Right VM (cm)Depth (cm)Pulsatility   Comment     +----------+-------------+----------+-----------+--------------+ MCA           63.00                 1.18                   +----------+-------------+----------+-----------+--------------+ ACA          -28.00                 0.97                   +----------+-------------+----------+-----------+--------------+ Term ICA      56.00                   1                    +----------+-------------+----------+-----------+--------------+ PCA           18.00  1.11                   +----------+-------------+----------+-----------+--------------+ Opthalmic     22.00                 1.52                   +----------+-------------+----------+-----------+--------------+ ICA siphon    38.00                 1.17                   +----------+-------------+----------+-----------+--------------+ Vertebral                                   not visualized +----------+-------------+----------+-----------+--------------+  +----------+------------+----------+-----------+--------------+ LEFT TCD  Left VM (cm)Depth (cm)Pulsatility   Comment     +----------+------------+----------+-----------+--------------+ MCA          67.00                  0.84                   +----------+------------+----------+-----------+--------------+ ACA          -24.00                1.06                   +----------+------------+----------+-----------+--------------+ Term ICA     60.00                 1.08                   +----------+------------+----------+-----------+--------------+ PCA          44.00                 1.02                   +----------+------------+----------+-----------+--------------+ Opthalmic    19.00                 1.66                   +----------+------------+----------+-----------+--------------+ ICA siphon   38.00                 0.95                   +----------+------------+----------+-----------+--------------+ Vertebral                                  not visualized +----------+------------+----------+-----------+--------------+  +------------+-------+------------------------+             VM cm/s        Comment          +------------+-------+------------------------+ Prox Basilar-14.00 Poorly visualized window +------------+-------+------------------------+ Summary:  Normalmean flow velocities in identified vessels of anterior cerebral cirn. Suboptimal occipital window limits evaluation of posterior circulation vessels. *See table(s) above for measurements and observations.  Diagnosing physician: Antony Contras MD Electronically signed by Antony Contras MD on 03/14/2019 at 8:07:57 AM.    Final    [place] Ekg Site Rite  Result Date: 03/14/2019 If Site Rite image not attached, placement could not be confirmed due to current cardiac rhythm.   PHYSICAL EXAM   Temp:  [97.5 F (36.4 C)-97.7 F (36.5 C)] 97.7 F (36.5 C) (10/15 0900) Pulse  Rate:  [59-65] 65 (10/15 0950) Resp:  [17-20] 20 (10/15 0950) BP: (110-136)/(69-79) 132/79 (10/15 0950) SpO2:  [91 %-100 %] 91 % (10/15 0950) Weight:  [123.4 kg] 123.4 kg (10/15 0046)  General -obese middle-aged Caucasian male not in  distress. . Afebrile. Head is nontraumatic. Neck is supple without bruit.    Cardiac exam no murmur or gallop. Lungs are clear to auscultation. Distal pulses are well felt. Neurological Exam ;  Awake alert oriented to time place and person.   .  All commands.  No dysarthria or aphasia.  Extraocular movements are full range without nystagmus.  Blinks to threat bilaterally.  Face is symmetric without weakness.  Tongue midline. Motor Strength -mild left hemiparesis with 4/5 left upper extremity and 3/5 left lower extremity strength.  Weakness of left grip and intrinsic hand muscles. . Sensation is equal bilaterally.  Deep tendon reflexes are symmetric.  Plantars are downgoing. Coordination - The patient had normal movements in right hand with no ataxia or dysmetria.  Tremor was absent.  Gait and Station - deferred.   ASSESSMENT/PLAN [name] is a 63 y.o. male with history of A. fib on Eliquis, CAD, hypertension, hyperlipidemia, OSA on CPAP admitted for unstable angina, status post CABG 10/2.  Found to have left lower extremity weakness yesterday afternoon but worsening this morning with left lower extremity plegic.  No tPA given due to outside window and recent surgery.     Acute episode of confusion and disorientation upon awakening of unclear etiology.  Possibly underlying encephalopathy following recent cardiac surgery and medication effect of benzos and sleep deprivation  Plan stat CT to rule out bleeding  BP reported normal, no hypotension  Will do EEG to rule out seizure  Avoid low BP  Continue Xarelto if no bleed  Stroke: 03/13/2019 s/p CABG right posterior ACA distal infarct, etiology uncertain, could be A. fib while holding on AC versus intracranial atherosclerosis in the setting of low BP  Resultant left arm weakness and left leg plegic  MRI not able to perform due to recent CABG procedure  CT head showed right distal ACA infarct  CTA head and neck No ELVO. New R  ACA A1 partially calcified clot. L V4 opacifiecation. B fetal origin PCAs w/ small VB system.   Repeat CT 10/6 stable small right ACA infarct  Carotid Doppler 10/1 bilateral ICA 1 to 39% stenosis  TEE 10/2 EF 55 to 60%, no PFO  TCD showed no proximal intracranial large vessel stenosis  LDL 85  HgbA1c 5.6  Lovenox for VTE prophylaxis  Eliquis (apixaban) daily prior to admission, now on aspirin 81 mg daily. On heparin IV 10/6 per stroke protocol, so far tolerating well, OK with transition back to Eliquis  Therapy recommendations: CIR  Disposition: Pending  Hx of hypertension Hypotension  BP low at 70s-90s on 10/4 from midnight to morning, now much improved  Pt on cardizem drip, milrinone drip, metoprolol po which all have impact on low BP  Yesterday BP 110-120s  Avoid low BP  BP goal 110-140 to maintain cerebral perfusion  Now BP 130-160s  afib RVR  Cardiology on board  On amiodarone, cardizem and metoprolol  On eliquis Unstable angina / CAD s/p CABG  CABG 10/2  CTS and cardiology on board  On ASA 81 and lipitor 80  AKI   Cre 0.88->2.21->1.33 ->0.97   On IVF  Manage per primary team  Hyperlipidemia  Home meds:  lipitor   LDL 85, goal <  45  Now on lipitor 80  Continue statin at discharge  Other Stroke Risk Factors  Advanced age  Morbid Obesity, Body mass index is 41.36 kg/m.   Hx stroke/TIA  Obstructive sleep apnea, on CPAP at home  Other Active Problems  Hyponatremia - Na 129->129->126    Leukocytosis WBC 12.8->10.8->10.4    Thrombocytopenia, resolved - Platelet 127->163->167   Hospital day # 16  I had a long discussion with the patient  About his transient confusion which appears to have resolved. Recommend increase trazodone to 100 mg hs for sleep. Continue eliquis for AF and stroke prevention.Transfer to [facility] in next few days. Stroke team will sign off. Call for questions.  Antony Contras, MD Stroke Neurology 03/23/2019 1:18  PM    To contact Stroke Continuity provider, please refer to [email or web address] After hours, contact General Neurology

## 2019-03-23 NOTE — Procedures (Signed)
History: 63 yo M with confusion on awakening.   Sedation: None  Technique: This is a 21 channel routine scalp EEG performed at the bedside with bipolar and monopolar montages arranged in accordance to the international 10/20 system of electrode placement. One channel was dedicated to EKG recording.    Background: The background consists of intermixed alpha and beta activities. There is a well defined posterior dominant rhythm of 10-11 Hz that attenuates with eye opening. Sleep is recorded with normal appearing structures. Positive occipital transients of sleep(POSTS) are seen, which are a normal variant.    Photic stimulation: Physiologic driving is not performed  EEG Abnormalities: None  Clinical Interpretation: This normal EEG is recorded in the waking and sleep state. There was no seizure or seizure predisposition recorded on this study. Please note that lack of epileptiform activity on EEG does not preclude the possibility of epilepsy.   Roland Rack, MD [facility] [phone]  If 7pm- 7am, please page neurology on call as listed in [place].

## 2019-03-23 NOTE — Progress Notes (Addendum)
Progress Note  Patient Name: Martin [name] Date of Encounter: 03/23/2019  Primary Cardiologist: Kate Sable, MD   Subjective  O/N events: Code Stroke called for disorientation. CT head and EEG negative.   Martin [name] was examined and evaluated at bedside this AM. He states he had uneventful night. Mentions feeling uncomfortable due to his positioning in his bed but otherwise feels fine. Denies any new chest pain, palpitations, dyspnea or focal deficits. Discussed yesterday's work-up for his disorientation. He states his wife was very worried but she has been reassured.  Inpatient Medications    Scheduled Meds: . allopurinol  300 mg Oral q morning - 10a  . amiodarone  400 mg Oral Daily  . apixaban  5 mg Oral BID  . aspirin EC  81 mg Oral Daily  . atorvastatin  80 mg Oral q1800  . bisacodyl  10 mg Rectal Daily   Or  . bisacodyl  10 mg Oral Daily  . Chlorhexidine Gluconate Cloth  6 each Topical Daily  . citalopram  10 mg Oral Daily  . docusate sodium  200 mg Oral Daily  . fluticasone  1 spray Each Nare Daily  . folic acid  1 mg Oral Daily  . furosemide  40 mg Intravenous Daily  . guaiFENesin  600 mg Oral BID  . insulin aspart  0-24 Units Subcutaneous TID AC & HS  . insulin detemir  10 Units Subcutaneous Daily  . metoprolol tartrate  50 mg Oral BID  . mometasone-formoterol  2 puff Inhalation BID  . mupirocin cream   Topical BID  . pantoprazole  40 mg Oral Daily  . potassium chloride  20 mEq Oral Daily  . sodium chloride flush  10-40 mL Intracatheter Q12H  . sodium chloride flush  3 mL Intravenous Q12H  . thiamine  100 mg Oral Daily  . traZODone  50 mg Oral QHS  . vitamin B-12  100 mcg Oral Daily   Continuous Infusions: . sodium chloride     PRN Meds: ipratropium-albuterol, magnesium citrate, ondansetron (ZOFRAN) IV, oxyCODONE, sodium chloride flush, sodium chloride flush, traMADol   Vital Signs    Vitals:   03/22/19 2149 03/22/19 2332 03/23/19 0046  03/23/19 0217  BP: 122/74 110/71  127/79  Pulse: 62 (!) 59  (!) 59  Resp:    17  Temp:  (!) 97.5 F (36.4 C)  97.7 F (36.5 C)  TempSrc:  Oral  Oral  SpO2:  93%    Weight:   123.4 kg   Height:        Intake/Output Summary (Last 24 hours) at 03/23/2019 0708 Last data filed at 03/23/2019 [id number] Gross per 24 hour  Intake 43 ml  Output 1200 ml  Net -1157 ml   Filed Weights   03/21/19 0500 03/22/19 0344 03/23/19 0046  Weight: 122.9 kg 124.9 kg 123.4 kg    Telemetry    A.fib rate controlled HR 60-70- Personally Reviewed  ECG    None from this am - Personally Reviewed  Physical Exam   Gen: Well-developed, obese, NAD HEENT: NCAT head, hearing intact, EOMI, Neck: supple, ROM intact, no JVD CV: Irregularly irregular, S1, S2 normal, No rubs, no murmurs, surgical scar covered with fresh bandaging Pulm: CTAB no rales, no wheezes Abd: Distended, soft, pacer wires covered w/ bandaging Extm: Peripheral pulses intact, trace pitting edema Skin: Dry, Warm, normal turgor  Neuro: AAOx3, Improved 2/5 strength on LLE  Labs    Chemistry Recent Labs  Lab 03/17/19 0408 03/18/19  [id number] 03/20/19 0436 03/21/19 0500  NA 128* 131* 131* 129*  K 3.7 3.5 3.6 4.1  CL 90* 96* 94* 94*  CO2 25 25 26 26   GLUCOSE 144* 108* 105* 118*  BUN 21 18 20 16   CREATININE 0.86 0.71 0.87 0.70  CALCIUM 8.2* 7.9* 8.4* 8.5*  PROT 5.4* 5.2* 5.6*  --   ALBUMIN 2.2* 2.2* 2.5*  --   AST 50* 95* 72*  --   ALT 40 79* 82*  --   ALKPHOS 155* 130* 119  --   BILITOT 1.2 1.1 1.5*  --   GFRNONAA >60 >60 >60 >60  GFRAA >60 >60 >60 >60  ANIONGAP 13 10 11 9      Hematology Recent Labs  Lab 03/20/19 0436 03/21/19 0500 03/22/19 0425  WBC 15.4* 20.1* 17.6*  RBC 3.58* 3.84* 3.47*  HGB 11.8* 12.7* 11.4*  HCT 35.0* 37.0* 34.6*  MCV 97.8 96.4 99.7  MCH 33.0 33.1 32.9  MCHC 33.7 34.3 32.9  RDW 13.4 13.3 13.3  PLT 308 400 351    Cardiac EnzymesNo results for input(s): TROPONINI in the last 168 hours. No  results for input(s): TROPIPOC in the last 168 hours.   BNP No results for input(s): BNP, PROBNP in the last 168 hours.   DDimer No results for input(s): DDIMER in the last 168 hours.   Radiology    Dg Chest Port 1 View  Result Date: 03/21/2019 CLINICAL DATA:  63 year old male with pleural effusion. EXAM: PORTABLE CHEST 1 VIEW COMPARISON:  Chest radiograph dated 03/20/2019 FINDINGS: Right-sided PICC in stable position. No significant interval change in the size of the pleural effusion or associated bibasilar atelectasis/infiltrate since the prior radiograph. No pneumothorax. Stable area of nodular density the left mid lung field as seen previously. Stable cardiomegaly. Median sternotomy wires and postsurgical changes of CABG. No acute osseous pathology. IMPRESSION: 1. No significant interval change in the size of the pleural effusions and associated bibasilar atelectasis/infiltrate. 2. Stable cardiomegaly. Electronically Signed   By: Anner Crete M.D.   On: 03/21/2019 16:01   Ct Head Code Stroke Wo Contrast  Result Date: 03/22/2019 CLINICAL DATA:  Code stroke. Altered level of consciousness (LOC), unexplained. Confusion. EXAM: CT HEAD WITHOUT CONTRAST TECHNIQUE: Contiguous axial images were obtained from the base of the skull through the vertex without intravenous contrast. COMPARISON:  Head CT 03/16/2019, CT angiogram head 03/14/2019, brain MRI 04/18/2018, CT angiogram head/neck 10/23/2017 FINDINGS: Brain: Unchanged appearance of a subacute right ACA as well as ACA/MCA watershed territory infarction. No new demarcated cortical infarction is identified. No evidence of intracranial hemorrhage. No midline shift or extra-axial fluid collection. Stable generalized parenchymal atrophy and chronic small vessel ischemic disease. Partially empty sella turcica. Vascular: No hyperdense vessel.  Atherosclerotic calcification. Skull: No calvarial fracture or suspicious osseous lesion. Sinuses/Orbits:  Minimal scattered paranasal sinus mucosal thickening at the imaged levels. Small amount of frothy secretions within a posterior left ethmoid air cell. No significant mastoid effusion. These results were communicated to Dr. Leonie Man At 5:06 pmon 10/14/2020by text page via the [facility] messaging system. IMPRESSION: 1. Unchanged appearance of subacute right ACA as well as ACA/MCA watershed territory infarcts. No new acute infarction is demonstrated. No intracranial hemorrhage. 2. Stable atrophy and chronic small vessel ischemic disease. Electronically Signed   By: Kellie Simmering   On: 03/22/2019 17:13    Cardiac Studies   Echo 03/08/19: IMPRESSIONS  1. Left ventricular ejection fraction, by visual estimation, is 55 to 60%. The left ventricle has normal  function. Normal left ventricular size. There is mildly increased left ventricular hypertrophy. Basal inferior akinesis. 2. Left ventricular diastolic Doppler parameters are consistent with impaired relaxation pattern of LV diastolic filling. 3. Global right ventricle has normal systolic function.The right ventricular size is normal. No increase in right ventricular wall thickness. 4. The aortic valve is tricuspid Aortic valve regurgitation is trivial by color flow Doppler. Mild aortic valve stenosis. Mean gradient 10 mmHg. 5. There is mild dilatation of the ascending aorta measuring 42 mm. 6. Left atrial size was moderately dilated. 7. Right atrial size was mildly dilated. 8. The tricuspid valve is normal in structure. Tricuspid valve regurgitation was not visualized by color flow Doppler. 9. The mitral valve is normal in structure. No evidence of mitral valve regurgitation. No evidence of mitral stenosis. 10. TR signal is inadequate for assessing pulmonary artery systolic pressure. 11. The inferior vena cava is normal in size with greater than 50% respiratory variability, suggesting right atrial pressure of 3 mmHg.  Carotid Dopplers 03/09/2019: 1  to 39% ICA stenosis bilaterally.  Left heart cath 03/08/2019:  Severe three-vessel coronary calcification  Patent left main  Eccentric ostial 50 to 75% LAD with mid vessel 40 to 50% narrowing. Mid to distal LAD stent is widely patent.  Ramus intermedius contains proximal 90% stenosis.  Circumflex contains 99% proximal/ostial stenosis.  RCA is heavily calcified and stented. The artery is totally occluded in the proximal to mid segment. Left-to-right collaterals are noted.  Mildly reduced LV systolic function with EF estimated to be 40%. LVEDP is 28 mmHg. Findings compatible with acute on chronic combined   Patient Profile     Mr.Calendine is a 63 yo M w/ PMH of prior CAD s/p pCI, PAF, HTN, OSA on CPAP presented with chest pain, found to have NSTEMI w/ multivessel dz requiring CABG. Hospital course complicated by acute ACA stroke.  Assessment & Plan    Acute ACA stroke Followed by neurology. PT/OT recommend CIR. LLE weak but improving. Code stroke called yesterday for disorientation after . Head CT, EEG negative. - C/w Eliquis - CIR per PT/OT  NSTEMI Multi-vessel CAD Cath with multi-vessel dz. S/p CABG post op day 13. No significant chest pain per pt. Post-op Echo w/ no significant different.. - C/w asa, atorvastatin - Start DAPT when cleared by CT surgery.  Diastolic heart failure TEE post Cabg w/ preserved EF. Urine output 1200 yesterday Weight this am 123.4kg Last known dry weight (122kg). No labs this am - C/w furosemide 40 daily - Daily weights, strict I&Os - Trend/replete K, Mag as appropriate   PAF Developed A.fib w/ RVR couple days ago. On amio, metop. Still irregular but not in RVR. Heart rate bordering on bradycardia after increase in metoprolol dose @ 60-70. Blood pressure stable. Amio switched to daily yesterday w/o recurrence of RVR - Need pacer wires removed - C/w metoprolol 50mg  BID - C/w oral amio 400mg  daily - C/w Eliquis  For questions or updates,  please contact [facility] Please consult [email or web address] for contact info under Cardiology/STEMI.   Signed, Gilberto Better, MD PGY-2, [facility] Pager: [phone] 03/23/2019, 7:08 AM   I have seen and examined the patient along with Gilberto Better, MD.  I have reviewed the chart, notes and new data.  I agree with his note.  Key new complaints: no new persistent neurological changes after yesterday "code stroke", still has L leg paresis Key examination changes: L leg paresis, irregular rhythm, no overt hypervolemia Key new findings / data:  AFib on monitor, rate controlled.  PLAN: Continue current meds. Will follow when he goes to CIR.  Sanda Klein, MD, [place] [phone] 03/23/2019, 9:20 AM

## 2019-03-23 NOTE — Progress Notes (Signed)
13 Days Post-Op Procedure(s) (LRB): CORONARY ARTERY BYPASS GRAFTING (CABG)X 3 Using Left Internal Mammary Artery and Right Saphenous Vein Grafts (N/A) TRANSESOPHAGEAL ECHOCARDIOGRAM (TEE) (N/A) Subjective: Patient feels fine He had the pacing wires removed 2 days ago and remains in stable afib, stable BP Medically ready to enter CIR for L leg weakness from postop CVA  Objective: Vital signs in last 24 hours: Temp:  [97.5 F (36.4 C)-97.7 F (36.5 C)] 97.7 F (36.5 C) (10/15 0900) Pulse Rate:  [59-65] 65 (10/15 0950) Cardiac Rhythm: Atrial fibrillation (10/15 1121) Resp:  [17-20] 20 (10/15 0950) BP: (110-136)/(69-79) 132/79 (10/15 0950) SpO2:  [91 %-100 %] 91 % (10/15 0950) Weight:  [123.4 kg] 123.4 kg (10/15 0046)  Hemodynamic parameters for last 24 hours:    Intake/Output from previous day: 10/14 0701 - 10/15 0700 In: 43 [I.V.:43] Out: 1200 [Urine:1200] Intake/Output this shift: Total I/O In: 270 [P.O.:240; I.V.:30] Out: 801 [Urine:800; Stool:1]  Incisions clean and dry Lungs clear  Lab Results: Recent Labs    03/21/19 0500 03/22/19 0425  WBC 20.1* 17.6*  HGB 12.7* 11.4*  HCT 37.0* 34.6*  PLT 400 351   BMET:  Recent Labs    03/21/19 0500  NA 129*  K 4.1  CL 94*  CO2 26  GLUCOSE 118*  BUN 16  CREATININE 0.70  CALCIUM 8.5*    PT/INR: No results for input(s): LABPROT, INR in the last 72 hours. ABG    Component Value Date/Time   PHART 7.478 (H) 03/16/2019 1239   HCO3 24.2 03/16/2019 1239   TCO2 25 03/16/2019 1239   ACIDBASEDEF 3.0 (H) 03/10/2019 1833   O2SAT 62.5 03/18/2019 0430   CBG (last 3)  Recent Labs    03/22/19 2140 03/23/19 0554 03/23/19 1106  GLUCAP 103* 100* 179*    Assessment/Plan: S/P Procedure(s) (LRB): CORONARY ARTERY BYPASS GRAFTING (CABG)X 3 Using Left Internal Mammary Artery and Right Saphenous Vein Grafts (N/A) TRANSESOPHAGEAL ECHOCARDIOGRAM (TEE) (N/A) WBC improving Change iv to oral antibiotics Ready for CIR if bed  available  LOS: 16 days    Tharon Aquas Trigt III 03/23/2019

## 2019-03-23 NOTE — Progress Notes (Signed)
Patient self manages home machine.  I did check the machine for water for patient and placed it near bed for easy access to it.  No distress noted, patient stated he would place it on.  Will continue to monitor.

## 2019-03-23 NOTE — Progress Notes (Signed)
Physical Therapy Treatment Patient Details Name: Martin [name] MRN: [id number] DOB: 27-Dec-1955 Today's Date: 03/23/2019    History of Present Illness Pt is a 63 y/o male with PMH of  CAD, paroxysmal atrial fibrillation, OA, low back pain, polysubstance abuse, presents with chest pain and possible NSTEMI. S/p L heart cath and coronary angiography 9/30, CABG x 3 03/10/19.  Course complicated by CT revealing R distal ACA infarct 03/13/19. Pt with periods of lethargy; EEG WNL.    PT Comments    Patient seen for mobility progression. Continue to recommend CIR for further skilled PT services to maximize independence and safety with mobility.    Follow Up Recommendations  CIR;Supervision/Assistance - 24 hour     Equipment Recommendations  Other (comment)(defer to CIR)    Recommendations for Other Services       Precautions / Restrictions Precautions Precautions: Fall;Sternal Precaution Booklet Issued: No Precaution Comments: Lft hemiparesis Restrictions Other Position/Activity Restrictions: sternal precautions     Mobility  Bed Mobility Overal bed mobility: Needs Assistance Bed Mobility: Supine to Sit     Supine to sit: Max assist;HOB elevated     General bed mobility comments: assistance to bring hips to EOB and to elevate trunk into sitting  Transfers Overall transfer level: Needs assistance Equipment used: Rolling walker (2 wheeled) Transfers: Sit to/from Stand Sit to Stand: Min assist;+2 safety/equipment         General transfer comment: cueing for hand placement and technique; assist to power up and to steady once in standing  Ambulation/Gait Ambulation/Gait assistance: Mod assist;+2 safety/equipment Gait Distance (Feet): 10 Feet Assistive device: Rolling walker (2 wheeled) Gait Pattern/deviations: Step-to pattern;Decreased step length - left;Decreased step length - right;Decreased stance time - left Gait velocity: decreased   General Gait Details: cues for  sequencing, weight shifting, proximtiy to RW, and increased L step length; assistance for weight shifting, guiding RW, and balance   Stairs             Wheelchair Mobility    Modified Rankin (Stroke Patients Only)       Balance Overall balance assessment: Needs assistance Sitting-balance support: Feet supported;No upper extremity supported Sitting balance-Leahy Scale: Fair   Postural control: Left lateral lean Standing balance support: Bilateral upper extremity supported;No upper extremity supported;During functional activity Standing balance-Leahy Scale: Poor Standing balance comment: worked on standing balance/weight shifting to midline; multimodal cues for L quad activation; pt able to stand statically without UE support, but fatigues easily and presents with increased L lateral lean; relaint on BUE support dynamically                            Cognition Arousal/Alertness: Awake/alert Behavior During Therapy: WFL for tasks assessed/performed Overall Cognitive Status: Impaired/Different from baseline Area of Impairment: Memory;Attention;Safety/judgement;Awareness;Problem solving;Following commands                   Current Attention Level: Sustained Memory: Decreased short-term memory;Decreased recall of precautions Following Commands: Follows one step commands consistently;Follows multi-step commands inconsistently;Follows one step commands with increased time Safety/Judgement: Decreased awareness of safety;Decreased awareness of deficits Awareness: Emergent Problem Solving: Slow processing;Difficulty sequencing;Requires verbal cues;Requires tactile cues        Exercises      General Comments        Pertinent Vitals/Pain Pain Assessment: Faces Faces Pain Scale: Hurts a little bit Pain Location: Sternum Pain Descriptors / Indicators: Discomfort;Guarding Pain Intervention(s): Limited activity within patient's tolerance;Monitored during  session;Repositioned    Home Living                      Prior Function            PT Goals (current goals can now be found in the care plan section) Acute Rehab PT Goals Patient Stated Goal: "Get home to my grandkids" Progress towards PT goals: Progressing toward goals    Frequency    Min 4X/week      PT Plan Current plan remains appropriate    Co-evaluation              AM-PAC PT "6 Clicks" Mobility   Outcome Measure  Help needed turning from your back to your side while in a flat bed without using bedrails?: A Lot Help needed moving from lying on your back to sitting on the side of a flat bed without using bedrails?: A Lot Help needed moving to and from a bed to a chair (including a wheelchair)?: A Lot Help needed standing up from a chair using your arms (e.g., wheelchair or bedside chair)?: A Little Help needed to walk in hospital room?: A Lot Help needed climbing 3-5 steps with a railing? : Total 6 Click Score: 12    End of Session Equipment Utilized During Treatment: Gait belt Activity Tolerance: Patient tolerated treatment well Patient left: with call bell/phone within reach;in chair;with chair alarm set Nurse Communication: Mobility status PT Visit Diagnosis: Muscle weakness (generalized) (M62.81);Hemiplegia and hemiparesis;Other abnormalities of gait and mobility (R26.89) Hemiplegia - Right/Left: Left Hemiplegia - dominant/non-dominant: Non-dominant Hemiplegia - caused by: Cerebral infarction     Time: 1018-1040 PT Time Calculation (min) (ACUTE ONLY): 22 min  Charges:  $Gait Training: 8-22 mins                     Earney Navy, PTA Acute Rehabilitation Services Pager: [phone] Office: [phone]     Darliss Cheney 03/23/2019, 2:36 PM

## 2019-03-23 NOTE — Progress Notes (Signed)
2 sutures removed at the pacing wire site. Pt tolerates procedure well.  Area left open to air.

## 2019-03-23 NOTE — Progress Notes (Signed)
Inpatient Rehabilitation-Admissions Coordinator   Still no word regarding insurance determination for CIR.   Will follow up once I have heard back from his insurance.   Jhonnie Garner, OTR/L  Rehab Admissions Coordinator  [phone] 03/23/2019 4:43 PM

## 2019-03-23 NOTE — Progress Notes (Addendum)
Went into patient room to help place on home CPAP unit.  Patient had stated earlier that he could handle putting it on himself.  Upon entering room, RN and NT were assisting patient up in bed.  When the bed was laid down, the patient stated he was having trouble breathing and then it appeared that patient had a vagal episode and lost consciousness for a minute, but the patient came straight back and asked [place] what was going on.  Patient was placed on O2 but wanted to be put on CPAP, so I placed patient on CPAP and entrained O2 at 9L through the line considering what happened to patient.  Will continue to monitor patient while he is sleeping and will try to wean O2 during the night.  No futher distress noted at this time.

## 2019-03-23 NOTE — Progress Notes (Signed)
Inpatient Rehabilitation-Admissions Coordinator   [facility] will begin insurance authorization for possible admit.   Jhonnie Garner, OTR/L  Rehab Admissions Coordinator  [phone] 03/23/2019 10:50 AM

## 2019-03-23 NOTE — Progress Notes (Addendum)
[address]       [name] [address]             [phone]      13 Days Post-Op Procedure(s) (LRB): CORONARY ARTERY BYPASS GRAFTING (CABG)X 3 Using Left Internal Mammary Artery and Right Saphenous Vein Grafts (N/A) TRANSESOPHAGEAL ECHOCARDIOGRAM (TEE) (N/A)   Subjective:  Up in chair, just worked with PT.  Feels good today, wants to get to rehab so he can get stronger and back to work.  Objective: Vital signs in last 24 hours: Temp:  [97.5 F (36.4 C)-97.7 F (36.5 C)] 97.7 F (36.5 C) (10/15 0900) Pulse Rate:  [59-65] 65 (10/15 0950) Cardiac Rhythm: Atrial fibrillation (10/15 0905) Resp:  [17-20] 20 (10/15 0950) BP: (107-136)/(69-81) 132/79 (10/15 0950) SpO2:  [91 %-100 %] 91 % (10/15 0950) Weight:  [123.4 kg] 123.4 kg (10/15 0046)  Intake/Output from previous day: 10/14 0701 - 10/15 0700 In: 43 [I.V.:43] Out: 1200 [Urine:1200] Intake/Output this shift: Total I/O In: 30 [I.V.:30] Out: 1 [Stool:1]  General appearance: alert, cooperative and no distress Heart: regular rate and rhythm Lungs: clear to auscultation bilaterally Abdomen: soft, non-tender; bowel sounds normal; no masses,  no organomegaly Extremities: edema trace Wound: clean and dry  Lab Results: Recent Labs    03/21/19 0500 03/22/19 0425  WBC 20.1* 17.6*  HGB 12.7* 11.4*  HCT 37.0* 34.6*  PLT 400 351   BMET:  Recent Labs    03/21/19 0500  NA 129*  K 4.1  CL 94*  CO2 26  GLUCOSE 118*  BUN 16  CREATININE 0.70  CALCIUM 8.5*    PT/INR: No results for input(s): LABPROT, INR in the last 72 hours. ABG    Component Value Date/Time   PHART 7.478 (H) 03/16/2019 1239   HCO3 24.2 03/16/2019 1239   TCO2 25 03/16/2019 1239   ACIDBASEDEF 3.0 (H) 03/10/2019 1833   O2SAT 62.5 03/18/2019 0430   CBG (last 3)  Recent Labs    03/22/19 1621 03/22/19 2140 03/23/19 0554  GLUCAP 104* 103* 100*    Assessment/Plan: S/P Procedure(s) (LRB): CORONARY ARTERY BYPASS  GRAFTING (CABG)X 3 Using Left Internal Mammary Artery and Right Saphenous Vein Grafts (N/A) TRANSESOPHAGEAL ECHOCARDIOGRAM (TEE) (N/A)  1. CV- H/O PAF, maintaining NSR on Amiodarone, Lopressor, and Eliquis 2. Pulm- on acute issues, off oxygen, H/O OSA on home regimen of CPAP 3. Renal- creatinine has been stable, weight is trending down, will continue Lasix, transition to oral regimen at discharge 4.GU- urinary retention, not uncommon post operatively, will plan to be discharged with foley in place, follow up with Urology for outpatient bladder training and foley catheter removal 5. ID- no source of infection identified, Urine Culture, blood cultures negative, surgical incisions are healing nicely, afebrile, mild leukocytosis 6. Neuro- AMS yesterday, neurology workup showed no progression of previous stroke.. patient thinks due to lack of sleep and medications 7. Deconditioning- post stroke, needs inpatient rehab 8. Dispo- patient is medically stable, he needs aggressive PT to rehabilitate from his stroke, all sources of infection have been ruled out, he is ready to go to CIR once insurance authorization can be approved,    LOS: 16 days    Martin [name] 03/23/2019  Events of yesterday note, but alert today  foly still in place due to urinary retention I have seen and examined Martin [name] and agree with the above assessment  and plan.  Grace Isaac MD [place] [phone] Office [phone] 03/23/2019 11:48 AM

## 2019-03-23 NOTE — Plan of Care (Signed)

## 2019-03-23 NOTE — Plan of Care (Signed)
  Problem: Education: Goal: Knowledge of General Education information will improve Description: Including pain rating scale, medication(s)/side effects and non-pharmacologic comfort measures Outcome: Progressing   Problem: Health Behavior/Discharge Planning: Goal: Ability to manage health-related needs will improve Outcome: Progressing   Problem: Clinical Measurements: Goal: Ability to maintain clinical measurements within normal limits will improve Outcome: Progressing Goal: Will remain free from infection Outcome: Progressing Goal: Diagnostic test results will improve Outcome: Progressing Goal: Respiratory complications will improve Outcome: Progressing Goal: Cardiovascular complication will be avoided Outcome: Progressing   Problem: Activity: Goal: Risk for activity intolerance will decrease Outcome: Progressing   Problem: Nutrition: Goal: Adequate nutrition will be maintained Outcome: Progressing   Problem: Coping: Goal: Level of anxiety will decrease Outcome: Progressing   Problem: Elimination: Goal: Will not experience complications related to bowel motility Outcome: Progressing Goal: Will not experience complications related to urinary retention Outcome: Progressing   Problem: Pain Managment: Goal: General experience of comfort will improve Outcome: Progressing   Problem: Safety: Goal: Ability to remain free from injury will improve Outcome: Progressing   Problem: Skin Integrity: Goal: Risk for impaired skin integrity will decrease Outcome: Progressing   Problem: Education: Goal: Will demonstrate proper wound care and an understanding of methods to prevent future damage Outcome: Progressing Goal: Knowledge of disease or condition will improve Outcome: Progressing Goal: Knowledge of the prescribed therapeutic regimen will improve Outcome: Progressing Goal: Individualized Educational Video(s) Outcome: Progressing   Problem: Activity: Goal: Risk for  activity intolerance will decrease Outcome: Progressing   Problem: Clinical Measurements: Goal: Postoperative complications will be avoided or minimized Outcome: Progressing   Problem: Cardiac: Goal: Will achieve and/or maintain hemodynamic stability Outcome: Progressing   Problem: Respiratory: Goal: Respiratory status will improve Outcome: Progressing   Problem: Skin Integrity: Goal: Wound healing without signs and symptoms of infection Outcome: Progressing Goal: Risk for impaired skin integrity will decrease Outcome: Progressing   Problem: Urinary Elimination: Goal: Ability to achieve and maintain adequate renal perfusion and functioning will improve Outcome: Progressing   Problem: Nutrition: Goal: Dietary intake will improve Outcome: Progressing   Problem: Ischemic Stroke/TIA Tissue Perfusion: Goal: Complications of ischemic stroke/TIA will be minimized Outcome: Progressing

## 2019-03-24 ENCOUNTER — Inpatient Hospital Stay (HOSPITAL_COMMUNITY): Payer: Medicare Other

## 2019-03-24 DIAGNOSIS — I313 Pericardial effusion (noninflammatory): Secondary | ICD-10-CM

## 2019-03-24 LAB — CBC
HCT: 33.7 % — ABNORMAL LOW (ref 39.0–52.0)
Hemoglobin: 11.1 g/dL — ABNORMAL LOW (ref 13.0–17.0)
MCH: 32.5 pg (ref 26.0–34.0)
MCHC: 32.9 g/dL (ref 30.0–36.0)
MCV: 98.5 fL (ref 80.0–100.0)
Platelets: 319 10*3/uL (ref 150–400)
RBC: 3.42 MIL/uL — ABNORMAL LOW (ref 4.22–5.81)
RDW: 13.3 % (ref 11.5–15.5)
WBC: 10.8 10*3/uL — ABNORMAL HIGH (ref 4.0–10.5)
nRBC: 0 % (ref 0.0–0.2)

## 2019-03-24 LAB — BASIC METABOLIC PANEL
Anion gap: 9 (ref 5–15)
BUN: 14 mg/dL (ref 8–23)
CO2: 26 mmol/L (ref 22–32)
Calcium: 8.5 mg/dL — ABNORMAL LOW (ref 8.9–10.3)
Chloride: 94 mmol/L — ABNORMAL LOW (ref 98–111)
Creatinine, Ser: 0.77 mg/dL (ref 0.61–1.24)
GFR calc Af Amer: >60 mL/min (ref >60)
GFR calc non Af Amer: >60 mL/min (ref >60)
Glucose, Bld: 99 mg/dL (ref 70–99)
Potassium: 3.8 mmol/L (ref 3.5–5.1)
Sodium: 129 mmol/L — ABNORMAL LOW (ref 135–145)

## 2019-03-24 LAB — GLUCOSE, CAPILLARY
Glucose-Capillary: 133 mg/dL — ABNORMAL HIGH (ref 70–99)
Glucose-Capillary: 138 mg/dL — ABNORMAL HIGH (ref 70–99)
Glucose-Capillary: 145 mg/dL — ABNORMAL HIGH (ref 70–99)
Glucose-Capillary: 109 mg/dL — ABNORMAL HIGH (ref 70–99)

## 2019-03-24 LAB — ECHOCARDIOGRAM LIMITED
Height: 68 in
Weight: 4313.96 oz

## 2019-03-24 MED ORDER — METOPROLOL TARTRATE 12.5 MG HALF TABLET: 12.5000 mg | ORAL_TABLET | Freq: Two times a day (BID) | ORAL | Status: DC

## 2019-03-24 NOTE — Progress Notes (Signed)
  Echocardiogram 2D Echocardiogram limited has been performed.  Martin [name] M 03/24/2019, 9:52 AM

## 2019-03-24 NOTE — Progress Notes (Signed)
RN and NT were adjusting pt and pulling pt up in bed when pt stated that her could not breathe. RN and NT tried to sit pt HOB up while RTT tried apply oxygen. Pt appeared to vagal, loss consciousness, and HR went to asystole. Telemetry noted 13 second pause in pt HR. Pt consciousness returned rather quickly and pt asked "what was going on?" RTT placed pt on his CPAP while RN and NT cycled blood pressures and and obtained an ECG. On call CVTS paged and told RN to place pacer pads on pt, hold amiodarone and beta blockers until cardiology could see pt, as well as contact Cardiology on call to inform them on the event. On call cardiology was paged and agreed with CVTS orders and came to unit to further assess pt telemetry. On call cardiology told RN to page again if pt had any pauses greater the 5 seconds. Will continue to monitor.  Tressie Ellis, RN

## 2019-03-24 NOTE — Progress Notes (Addendum)
Progress Note  Patient Name: Martin [name] Date of Encounter: 03/24/2019  Primary Cardiologist: Kate Sable, MD   Subjective  O/N event: Episode of non-sustained 13 second pause w/ syncope while being moved in bed. EKG shows a.fib w/ bradycardia  Mr.Kemna was examined and evaluated at bedside with overnight nurse. He was observed eating breakfast w/o difficulty. He mentions episode of hyperventilation overnight with loss of consciousness. Overnight nurse provides additional history, mentioning he had episode of seizure-like activity with syncope and 13 second episode of asystole that resolved spontaneously. No further event since then. Denies any chest pain, palpitations, dyspnea. States he 'feels fine.'  Inpatient Medications    Scheduled Meds: . allopurinol  300 mg Oral q morning - 10a  . amiodarone  200 mg Oral Daily  . apixaban  5 mg Oral BID  . aspirin EC  81 mg Oral Daily  . atorvastatin  80 mg Oral q1800  . bisacodyl  10 mg Rectal Daily   Or  . bisacodyl  10 mg Oral Daily  . Chlorhexidine Gluconate Cloth  6 each Topical Daily  . ciprofloxacin  500 mg Oral BID  . citalopram  10 mg Oral Daily  . docusate sodium  200 mg Oral Daily  . doxycycline  100 mg Oral Q12H  . fluticasone  1 spray Each Nare Daily  . folic acid  1 mg Oral Daily  . furosemide  40 mg Oral Daily  . guaiFENesin  600 mg Oral BID  . insulin aspart  0-24 Units Subcutaneous TID AC & HS  . insulin detemir  10 Units Subcutaneous Daily  . metoprolol tartrate  50 mg Oral BID  . mometasone-formoterol  2 puff Inhalation BID  . mupirocin cream   Topical BID  . pantoprazole  40 mg Oral Daily  . potassium chloride  20 mEq Oral Daily  . sodium chloride flush  10-40 mL Intracatheter Q12H  . sodium chloride flush  3 mL Intravenous Q12H  . thiamine  100 mg Oral Daily  . traZODone  50 mg Oral QHS  . vitamin B-12  100 mcg Oral Daily   Continuous Infusions: . sodium chloride     PRN Meds:  ipratropium-albuterol, magnesium citrate, oxyCODONE, sodium chloride flush, sodium chloride flush, traMADol, witch hazel-glycerin   Vital Signs    Vitals:   03/23/19 2037 03/23/19 2208 03/23/19 2252 03/24/19 0325  BP:  113/83 (!) 121/99 136/72  Pulse: 62 68 71 (!) 59  Resp: 16  (!) 23 20  Temp:   (!) 97.5 F (36.4 C) 97.8 F (36.6 C)  TempSrc:   Oral Oral  SpO2: 100%  100% 99%  Weight:    122.3 kg  Height:        Intake/Output Summary (Last 24 hours) at 03/24/2019 0650 Last data filed at 03/24/2019 0326 Gross per 24 hour  Intake 523 ml  Output 1951 ml  Net -1428 ml   Filed Weights   03/22/19 0344 03/23/19 0046 03/24/19 0325  Weight: 124.9 kg 123.4 kg 122.3 kg    Telemetry    Overnight around 10:47pm episode of skipped beats followed by ~10 sec of asystole, with return to A.fib - Personally Reviewed  ECG    Low amplitude EKG, A.fib w/ HR @ 67 - Personally Reviewed  Physical Exam   GEN: No acute distress, obese Neck: No JVD Cardiac: RRR, no murmurs, rubs, or gallops, pacer pads in place Respiratory: Clear to auscultation bilaterally. GI: Soft, nontender, distended MS: 2+ pitting  edema; No deformity. Neuro:  Left extremity weaker than right Psych: Normal affect   Labs    Chemistry Recent Labs  Lab 03/18/19 0426 03/20/19 0436 03/21/19 0500 03/24/19 0335  NA 131* 131* 129* 129*  K 3.5 3.6 4.1 3.8  CL 96* 94* 94* 94*  CO2 25 26 26 26   GLUCOSE 108* 105* 118* 99  BUN 18 20 16 14   CREATININE 0.71 0.87 0.70 0.77  CALCIUM 7.9* 8.4* 8.5* 8.5*  PROT 5.2* 5.6*  --   --   ALBUMIN 2.2* 2.5*  --   --   AST 95* 72*  --   --   ALT 79* 82*  --   --   ALKPHOS 130* 119  --   --   BILITOT 1.1 1.5*  --   --   GFRNONAA >60 >60 >60 >60  GFRAA >60 >60 >60 >60  ANIONGAP 10 11 9 9      Hematology Recent Labs  Lab 03/21/19 0500 03/22/19 0425 03/24/19 0335  WBC 20.1* 17.6* 10.8*  RBC 3.84* 3.47* 3.42*  HGB 12.7* 11.4* 11.1*  HCT 37.0* 34.6* 33.7*  MCV 96.4 99.7  98.5  MCH 33.1 32.9 32.5  MCHC 34.3 32.9 32.9  RDW 13.3 13.3 13.3  PLT 400 351 319    Cardiac EnzymesNo results for input(s): TROPONINI in the last 168 hours. No results for input(s): TROPIPOC in the last 168 hours.   BNPNo results for input(s): BNP, PROBNP in the last 168 hours.   DDimer No results for input(s): DDIMER in the last 168 hours.   Radiology    Ct Head Code Stroke Wo Contrast  Result Date: 03/22/2019 CLINICAL DATA:  Code stroke. Altered level of consciousness (LOC), unexplained. Confusion. EXAM: CT HEAD WITHOUT CONTRAST TECHNIQUE: Contiguous axial images were obtained from the base of the skull through the vertex without intravenous contrast. COMPARISON:  Head CT 03/16/2019, CT angiogram head 03/14/2019, brain MRI 04/18/2018, CT angiogram head/neck 10/23/2017 FINDINGS: Brain: Unchanged appearance of a subacute right ACA as well as ACA/MCA watershed territory infarction. No new demarcated cortical infarction is identified. No evidence of intracranial hemorrhage. No midline shift or extra-axial fluid collection. Stable generalized parenchymal atrophy and chronic small vessel ischemic disease. Partially empty sella turcica. Vascular: No hyperdense vessel.  Atherosclerotic calcification. Skull: No calvarial fracture or suspicious osseous lesion. Sinuses/Orbits: Minimal scattered paranasal sinus mucosal thickening at the imaged levels. Small amount of frothy secretions within a posterior left ethmoid air cell. No significant mastoid effusion. These results were communicated to Dr. Leonie Man At 5:06 pmon 10/14/2020by text page via the [facility] messaging system. IMPRESSION: 1. Unchanged appearance of subacute right ACA as well as ACA/MCA watershed territory infarcts. No new acute infarction is demonstrated. No intracranial hemorrhage. 2. Stable atrophy and chronic small vessel ischemic disease. Electronically Signed   By: Kellie Simmering   On: 03/22/2019 17:13    Cardiac Studies   Echo 03/08/19:  IMPRESSIONS  1. Left ventricular ejection fraction, by visual estimation, is 55 to 60%. The left ventricle has normal function. Normal left ventricular size. There is mildly increased left ventricular hypertrophy. Basal inferior akinesis. 2. Left ventricular diastolic Doppler parameters are consistent with impaired relaxation pattern of LV diastolic filling. 3. Global right ventricle has normal systolic function.The right ventricular size is normal. No increase in right ventricular wall thickness. 4. The aortic valve is tricuspid Aortic valve regurgitation is trivial by color flow Doppler. Mild aortic valve stenosis. Mean gradient 10 mmHg. 5. There is mild dilatation of  the ascending aorta measuring 42 mm. 6. Left atrial size was moderately dilated. 7. Right atrial size was mildly dilated. 8. The tricuspid valve is normal in structure. Tricuspid valve regurgitation was not visualized by color flow Doppler. 9. The mitral valve is normal in structure. No evidence of mitral valve regurgitation. No evidence of mitral stenosis. 10. TR signal is inadequate for assessing pulmonary artery systolic pressure. 11. The inferior vena cava is normal in size with greater than 50% respiratory variability, suggesting right atrial pressure of 3 mmHg.  Carotid Dopplers 03/09/2019:1 to 39% ICA stenosis bilaterally.  Left heart cath 03/08/2019:  Severe three-vessel coronary calcification  Patent left main  Eccentric ostial 50 to 75% LAD with mid vessel 40 to 50% narrowing. Mid to distal LAD stent is widely patent.  Ramus intermedius contains proximal 90% stenosis.  Circumflex contains 99% proximal/ostial stenosis.  RCA is heavily calcified and stented. The artery is totally occluded in the proximal to mid segment. Left-to-right collaterals are noted.  Mildly reduced LV systolic function with EF estimated to be 40%. LVEDP is 28 mmHg. Findings compatible with acute on chronic combined    Patient Profile     63 y.o. male w/ PMh of prior CAD s/p PCI, PAF, HTN, OSA on CPAP w/ NSTEMI w/ multivessel dz requiring CABG. Complicated by acute ACA stroke  Assessment & Plan    Syncopal Event 03/23/19 PAF Developed A.fib w/ RVR during hospitalization. On amio, metop. Still irregular but not in RVR. Heart rate @ 60-70. Blood pressure stable. Episode of what appears to be vasovagal syncope. Amio and metoprolol held per CT surgery. Current HR ~60. Chad-vasc score of 5 (stroke, Dm, HTN, age) - PT/OT - Decrease metoprolol dose 12.5mg  BID - Decrease oral amio 200mg  daily - C/w Eliquis  Acute ACA stroke Followed by neurology. PT/OT recommend CIR. LLE weak but improving. Repeat head CT / EEG on 03/22/19 without acute findings - C/w Eliquis - CIR per PT/OT  NSTEMI Multi-vessel CAD Cath with multi-vessel dz. S/p CABG post op day 13. No significant chest pain per pt. Post-op Echo w/ no significant different.. - C/w asa, atorvastatin - Start DAPT when cleared by CT surgery.  Diastolic heart failure TEE post Cabg w/ preserved EF. Urine output 2L yesterday Weight this am 122.3kg at dry weight. - C/w furosemide 40 daily - Daily weights, strict I&Os - Trend/replete K, Mag as appropriate     For questions or updates, please contact [place] Please consult [email or web address] for contact info under Cardiology/STEMI.     Signed, Gilberto Better, MD PGY-2, [place] [facility] Pager: [phone] 03/24/2019, 6:50 AM    Attending attestation to follow  I have seen and examined the patient along with Gilberto Better, MD.  I have reviewed the chart, notes and new data.  I agree with PA/NP's note.  Key new complaints: Had a near syncopal event last night associated with a long pause that occurred essentially during the Valsalva maneuver (he was pulling himself up on the side rails of the bed). Key examination changes: edema improved; strength in left lower extremity now 3/5, although he still cannot  wiggle his toes.  Irregular rhythm, rate controlled Key new findings / data: Telemetry reviewed, mostly atrial fibrillation with controlled ventricular rates except for the pause around 10 PM last night, described above  PLAN: The doses of amiodarone and metoprolol have been appropriately decreased.  Seems to be approaching his presurgical weight.  Now on oral diuretics.  We will continue to follow  in rehab.  Sanda Klein, MD, [place] [phone] 03/24/2019, 9:09 AM

## 2019-03-24 NOTE — Progress Notes (Signed)
  Speech Language Pathology Treatment: Cognitive-Linquistic  Patient Details Name: Martin [name] MRN: [id number] DOB: [birth year] Today's Date: 03/24/2019 Time: [id number] SLP Time Calculation (min) (ACUTE ONLY): 12 min  Assessment / Plan / Recommendation Clinical Impression  Pt was seen for f/u cognitive treatment. He continues to exhibit difficulty with selective attention, but today he showed improved emergent awareness, adjusting his environment with Mod I as distractions were presented. SLP provided Min cues for redirection otherwise. Min cues were also provided for recall of new information and overall plan of care. He remains a good candidate for CIR.    HPI HPI: Pt is a 63 y/o male with PMH of  CAD, paroxysmal atrial fibrillation, OA, low back pain, polysubstance abuse, presents with chest pain and possible NSTEMI. S/p L heart cath and coronary angiography 9/30, CABG x 3 03/10/19.  Course complicated by CT revealing R distal ACA infarct 03/13/19. New CXR 10/8 afer full liquid diet initiated: "Evidence of bilateral pleural effusions with retrocardiac and left perihilar atelectasis."      SLP Plan  Continue with current plan of care       Recommendations                   Oral Care Recommendations: Oral care BID Follow up Recommendations: Inpatient Rehab SLP Visit Diagnosis: Cognitive communication deficit [id number] Plan: Continue with current plan of care       [place] Setareh Rom 03/24/2019, 1:22 PM  Pollyann Glen, M.A. [place] Acute Environmental education officer [phone] Office [phone]

## 2019-03-24 NOTE — Progress Notes (Signed)
14 Days Post-Op Procedure(s) (LRB): CORONARY ARTERY BYPASS GRAFTING (CABG)X 3 Using Left Internal Mammary Artery and Right Saphenous Vein Grafts (N/A) TRANSESOPHAGEAL ECHOCARDIOGRAM (TEE) (N/A) Subjective: Patient feels well today, slept well last night Heart rate 62 atrial fibrillation Gradually improving left-sided weakness clearly seen Surgical incisions healing well.  White count has returned to normal after Cipro was started for probable UTI patient has had multiple Foley catheter placements now with an indwelling catheter until discharge and urology appointment as outpatient  Beta-blocker today and resume at low-dose tomorrow Patient has been on extended course of amiodarone without conversion to sinus rhythm so we will stop.  Medically ready for transfer to CIR when a bed is available Objective: Vital signs in last 24 hours: Temp:  [97.5 F (36.4 C)-98.7 F (37.1 C)] 98.6 F (37 C) (10/16 0800) Pulse Rate:  [59-71] 59 (10/16 0325) Cardiac Rhythm: Atrial fibrillation (10/16 0800) Resp:  [16-23] 20 (10/16 0325) BP: (113-149)/(72-99) 136/72 (10/16 0325) SpO2:  [95 %-100 %] 98 % (10/16 0827) Weight:  [122.3 kg] 122.3 kg (10/16 0325)  Hemodynamic parameters for last 24 hours:  Stable  Intake/Output from previous day: 10/15 0701 - 10/16 0700 In: 523 [P.O.:480; I.V.:43] Out: 1951 [Urine:1950; Stool:1] Intake/Output this shift: Total I/O In: -  Out: 500 [Urine:500]  Alert and responsive, appropriate Lungs clear Sternal incision clean and dry Minimal edema  Lab Results: Recent Labs    03/22/19 0425 03/24/19 0335  WBC 17.6* 10.8*  HGB 11.4* 11.1*  HCT 34.6* 33.7*  PLT 351 319   BMET:  Recent Labs    03/24/19 0335  NA 129*  K 3.8  CL 94*  CO2 26  GLUCOSE 99  BUN 14  CREATININE 0.77  CALCIUM 8.5*    PT/INR: No results for input(s): LABPROT, INR in the last 72 hours. ABG    Component Value Date/Time   PHART 7.478 (H) 03/16/2019 1239   HCO3 24.2  03/16/2019 1239   TCO2 25 03/16/2019 1239   ACIDBASEDEF 3.0 (H) 03/10/2019 1833   O2SAT 62.5 03/18/2019 0430   CBG (last 3)  Recent Labs    03/23/19 2105 03/23/19 2301 03/24/19 0619  GLUCAP 157* 130* 133*    Assessment/Plan: S/P Procedure(s) (LRB): CORONARY ARTERY BYPASS GRAFTING (CABG)X 3 Using Left Internal Mammary Artery and Right Saphenous Vein Grafts (N/A) TRANSESOPHAGEAL ECHOCARDIOGRAM (TEE) (N/A) Preoperative STEMI with unsuccessful attempt at PCI Urgent CABG with right subcortical CVA postop day 3 Postoperative atrial fibrillation on Eliquis, aspirin.  Beta-blocker dose reduced because of sinus pause and bradycardia also related to a vagal episode Will not start additional antiplatelet drugs because of risk of hemorrhagic stroke. Echocardiogram today shows good LV systolic function with a stable posterior 1.5 cm pericardial effusion around the left ventricle.  Martin Byes MD  LOS: 17 days    Martin [name] 03/24/2019

## 2019-03-24 NOTE — Plan of Care (Signed)

## 2019-03-24 NOTE — Consult Note (Addendum)
Cardiology Consultation:   Patient ID: [name] MRN: [id number]; DOB: 1956/02/01  Admit date: 03/07/2019 Date of Consult: 03/24/2019  Primary Care Provider: [name], MD Primary Cardiologist: Kate Sable, MD  Primary Electrophysiologist:  [name], MD    Patient Profile:   Martin [name] is a 63 y.o. male with a hx of persistent AFibm, CAD with prior PCIs,  HTN, HLD, obesity, OSA w/CPAP, who is being seen today for the evaluation of asystolic event at the request of Dr. Sallyanne Kuster.  History of Present Illness:   [name] was admitted 03/07/2019 with CP, NSTEMI > cath noted significant multivessel CAD >> CABG 03/10/2019 (x3, LIMA-LAD, SVG-RAMUS, SVG-PDA) He had post op AF with fast rates started on amio and metoprolol (I do not see any evidence of SR afterwards) though achieved rate control with notes reporting some rates into the 50's 03/13/2019 suffered a stroke with LLE weakness >> plegia Neurology noted :right posterior ACA distal infarct, etiology uncertain, could be A. fib while holding on AC versus intracranial atherosclerosis in the setting of low BP Started on eliquis (appropriately dosed) Neuro s/o yesterday  Planned for CIR  Last night while moving up in bed, (the patient helping/pulling himself some and he says likely holding his breath/bearing down) had a syncopal event associated with a [id number] sec asystolic event  EP was asked to see him this AM VSS stable outside of this event   He denies any CP, or chest wall pain, remains with L LE weakness and is very motivated to get to rehab.  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  LABS NA 129 K+ 3.8 BUN/Creat 14/0.77 WBC 10.8 H/H 10/33 Plts 319  Current rate controlling meds 1. Amiodarone 200mg  daily (reduced yesterday from 400mg  QD, prior to that was 400mg  BID) 2. Lopressor 50mg  BID Last dose of dilt was 10/13   Heart Pathway Score:     Past Medical  History:  Diagnosis Date  . Atrial fibrillation ([place])    a. dx 10/2017. b. s/p DCCV on 11/19/2017 with successful conversion to NSR but back in AFIB 3 weeks later  . CAD (coronary artery disease)    a. s/p PCI to LAD 2001. b. s/p stenting to the RCA and mid LAD December 2011, residual distal RCA disease treated medically. b. nuc 02/2013 abnormal with fixed defect seen in inferoapical, mid-inferior, and basal inferior walls, indicative of myocardial scar, no evidence of ischemia, EF 41% (Ef 55-60% by echo same time).  . Carotid artery disease ([place])    carotid Doppler course left side 5069% stenosis  . Dysrhythmia    AFib  . Habitual alcohol use   . Hyperlipidemia   . Hypertension   . Low back pain   . OSA on CPAP   . Osteoarthritis   . Polysubstance abuse ([place])    use including marijuana and vicodin,which he obtained from the street  . PVC's (premature ventricular contractions)    status post Holter monitor normal sinus rhythm otherwise asymptomatic PVCs.  . Seasonal allergies     Past Surgical History:  Procedure Laterality Date  . CARDIOVERSION N/A 11/19/2017   Procedure: CARDIOVERSION;  Surgeon: Herminio Commons, MD;  Location: [facility];  Service: Cardiovascular;  Laterality: N/A;  . CORONARY ANGIOPLASTY WITH STENT PLACEMENT  2011  . CORONARY ARTERY BYPASS GRAFT N/A 03/10/2019   Procedure: CORONARY ARTERY BYPASS GRAFTING (CABG)X 3 Using Left Internal Mammary Artery and  Right Saphenous Vein Grafts;  Surgeon: Ivin Poot, MD;  Location: [facility];  Service: Open Heart Surgery;  Laterality: N/A;  . LEFT HEART CATH AND CORONARY ANGIOGRAPHY N/A 03/08/2019   Procedure: LEFT HEART CATH AND CORONARY ANGIOGRAPHY;  Surgeon: Belva Crome, MD;  Location: [facility];  Service: Cardiovascular;  Laterality: N/A;  . TEE WITHOUT CARDIOVERSION N/A 03/10/2019   Procedure: TRANSESOPHAGEAL ECHOCARDIOGRAM (TEE);  Surgeon: Prescott Gum, Collier Salina, MD;  Location: [facility];  Service: Open Heart Surgery;   Laterality: N/A;  . TONSILLECTOMY         Inpatient Medications: Scheduled Meds: . allopurinol  300 mg Oral q morning - 10a  . apixaban  5 mg Oral BID  . aspirin EC  81 mg Oral Daily  . atorvastatin  80 mg Oral q1800  . bisacodyl  10 mg Rectal Daily   Or  . bisacodyl  10 mg Oral Daily  . Chlorhexidine Gluconate Cloth  6 each Topical Daily  . ciprofloxacin  500 mg Oral BID  . citalopram  10 mg Oral Daily  . docusate sodium  200 mg Oral Daily  . doxycycline  100 mg Oral Q12H  . fluticasone  1 spray Each Nare Daily  . folic acid  1 mg Oral Daily  . furosemide  40 mg Oral Daily  . guaiFENesin  600 mg Oral BID  . insulin aspart  0-24 Units Subcutaneous TID AC & HS  . insulin detemir  10 Units Subcutaneous Daily  . mometasone-formoterol  2 puff Inhalation BID  . mupirocin cream   Topical BID  . pantoprazole  40 mg Oral Daily  . potassium chloride  20 mEq Oral Daily  . sodium chloride flush  10-40 mL Intracatheter Q12H  . sodium chloride flush  3 mL Intravenous Q12H  . thiamine  100 mg Oral Daily  . traZODone  50 mg Oral QHS  . vitamin B-12  100 mcg Oral Daily   Continuous Infusions: . sodium chloride     PRN Meds: ipratropium-albuterol, magnesium citrate, oxyCODONE, sodium chloride flush, sodium chloride flush, traMADol, witch hazel-glycerin  Allergies:    Allergies  Allergen Reactions  . Penicillins Hives and Rash    Has patient had a PCN reaction causing immediate rash, facial/tongue/throat swelling, SOB or lightheadedness with hypotension: Unknown Has patient had a PCN reaction causing severe rash involving mucus membranes or skin necrosis: Unknown Has patient had a PCN reaction that required hospitalization: No Has patient had a PCN reaction occurring within the last 10 years: No If all of the above answers are "NO", then may proceed with Cephalosporin use.      Social History:   Social History   Socioeconomic History  . Marital status: Married    Spouse  name: Not on file  . Number of children: Not on file  . Years of education: Not on file  . Highest education level: Not on file  Occupational History    Employer: UNEMPLOYED    Comment: works Social worker employed  Social Needs  . Financial resource strain: Not on file  . Food insecurity    Worry: Not on file    Inability: Not on file  . Transportation needs    Medical: Not on file    Non-medical: Not on file  Tobacco Use  . Smoking status: Former Smoker    Packs/day: 1.00    Years: 35.00    Pack years: 35.00    Types: Cigarettes    Start date:  09/18/1967    Quit date: 06/09/1999    Years since quitting: 19.8  . Smokeless tobacco: Never Used  Substance and Sexual Activity  . Alcohol use: Yes    Alcohol/week: 42.0 standard drinks    Types: 42 Shots of liquor per week    Comment: 1/2 gallon og liquor per week  . Drug use: Yes    Types: Marijuana    Comment: smokes marijuana occassionally-last smoked few weeks ago  . Sexual activity: Yes    Birth control/protection: None  Lifestyle  . Physical activity    Days per week: Not on file    Minutes per session: Not on file  . Stress: Not on file  Relationships  . Social Herbalist on phone: Not on file    Gets together: Not on file    Attends religious service: Not on file    Active member of club or organization: Not on file    Attends meetings of clubs or organizations: Not on file    Relationship status: Not on file  . Intimate partner violence    Fear of current or ex partner: Not on file    Emotionally abused: Not on file    Physically abused: Not on file    Forced sexual activity: Not on file  Other Topics Concern  . Not on file  Social History Narrative   No exercise    Family History:   Family History  Problem Relation Age of Onset  . Stroke Mother   . Other Mother        small bowel obstruction  . Heart attack Father   . Heart attack Brother      ROS:  Please see the history of present  illness.  All other ROS reviewed and negative.     Physical Exam/Data:   Vitals:   03/23/19 2252 03/24/19 0325 03/24/19 0800 03/24/19 0827  BP: (!) 121/99 136/72    Pulse: 71 (!) 59    Resp: (!) 23 20    Temp: (!) 97.5 F (36.4 C) 97.8 F (36.6 C) 98.6 F (37 C)   TempSrc: Oral Oral Oral   SpO2: 100% 99%  98%  Weight:  122.3 kg    Height:        Intake/Output Summary (Last 24 hours) at 03/24/2019 0946 Last data filed at 03/24/2019 0800 Gross per 24 hour  Intake 493 ml  Output 2451 ml  Net -1958 ml   Last 3 Weights 03/24/2019 03/23/2019 03/22/2019  Weight (lbs) 269 lb 10 oz 272 lb 0.8 oz 275 lb 5.7 oz  Weight (kg) 122.3 kg 123.4 kg 124.9 kg     Body mass index is 41 kg/m.  General:  Well nourished, well developed, in no acute distress HEENT: normal Lymph: no adenopathy Neck: no JVD Endocrine:  No thryomegaly Vascular: No carotid bruits  Cardiac: irreg-irreg; no murmurs, gallops or rubs Lungs:  Fine crackles at bases, otherwise CTA b/l, no wheezing, rhonchi or rales Abd: soft, nontender, obese  Ext: no edema Musculoskeletal:  No deformities Skin: warm and dry  Neuro:  Voices ongoing LLE weakness Psych:  Normal affect   EKG:  The EKG was personally reviewed and demonstrates:   Yesterday AFib 57bpm, ST/T changes , nonspecific Telemetry:  Telemetry was personally reviewed and demonstrates:   Last 3 days reviewed AFib w/CVR generally 50's-60's, last night 14.5 second pause/asystole with preceding rate slowing  Relevant CV Studies:  Echo 03/08/19: IMPRESSIONS 1. Left ventricular ejection  fraction, by visual estimation, is 55 to 60%. The left ventricle has normal function. Normal left ventricular size. There is mildly increased left ventricular hypertrophy. Basal inferior akinesis. 2. Left ventricular diastolic Doppler parameters are consistent with impaired relaxation pattern of LV diastolic filling. 3. Global right ventricle has normal systolic function.The  right ventricular size is normal. No increase in right ventricular wall thickness. 4. The aortic valve is tricuspid Aortic valve regurgitation is trivial by color flow Doppler. Mild aortic valve stenosis. Mean gradient 10 mmHg. 5. There is mild dilatation of the ascending aorta measuring 42 mm. 6. Left atrial size was moderately dilated. 7. Right atrial size was mildly dilated. 8. The tricuspid valve is normal in structure. Tricuspid valve regurgitation was not visualized by color flow Doppler. 9. The mitral valve is normal in structure. No evidence of mitral valve regurgitation. No evidence of mitral stenosis. 10. TR signal is inadequate for assessing pulmonary artery systolic pressure. 11. The inferior vena cava is normal in size with greater than 50% respiratory variability, suggesting right atrial pressure of 3 mmHg.  Carotid Dopplers 03/09/2019: 1 to 39% ICA stenosis bilaterally.  Left heart cath 03/08/2019:  Severe three-vessel coronary calcification  Patent left main  Eccentric ostial 50 to 75% LAD with mid vessel 40 to 50% narrowing. Mid to distal LAD stent is widely patent.  Ramus intermedius contains proximal 90% stenosis.  Circumflex contains 99% proximal/ostial stenosis.  RCA is heavily calcified and stented. The artery is totally occluded in the proximal to mid segment. Left-to-right collaterals are noted.  Mildly reduced LV systolic function with EF estimated to be 40%. LVEDP is 28 mmHg. Findings compatible with acute on chronic combined   Laboratory Data:  High Sensitivity Troponin:   Recent Labs  Lab 03/07/19 1416 03/07/19 1611 03/07/19 2358 03/08/19 0217  TROPONINIHS 71* 82* 69* 57*     Chemistry Recent Labs  Lab 03/20/19 0436 03/21/19 0500 03/24/19 0335  NA 131* 129* 129*  K 3.6 4.1 3.8  CL 94* 94* 94*  CO2 26 26 26   GLUCOSE 105* 118* 99  BUN 20 16 14   CREATININE 0.87 0.70 0.77  CALCIUM 8.4* 8.5* 8.5*  GFRNONAA >60 >60 >60  GFRAA >60  >60 >60  ANIONGAP 11 9 9     Recent Labs  Lab 03/18/19 0426 03/20/19 0436  PROT 5.2* 5.6*  ALBUMIN 2.2* 2.5*  AST 95* 72*  ALT 79* 82*  ALKPHOS 130* 119  BILITOT 1.1 1.5*   Hematology Recent Labs  Lab 03/21/19 0500 03/22/19 0425 03/24/19 0335  WBC 20.1* 17.6* 10.8*  RBC 3.84* 3.47* 3.42*  HGB 12.7* 11.4* 11.1*  HCT 37.0* 34.6* 33.7*  MCV 96.4 99.7 98.5  MCH 33.1 32.9 32.5  MCHC 34.3 32.9 32.9  RDW 13.3 13.3 13.3  PLT 400 351 319   BNPNo results for input(s): BNP, PROBNP in the last 168 hours.  DDimer No results for input(s): DDIMER in the last 168 hours.   Radiology/Studies:   Dg Chest Port 1 View Result Date: 03/21/2019 CLINICAL DATA:  63 year old male with pleural effusion. EXAM: PORTABLE CHEST 1 VIEW COMPARISON:  Chest radiograph dated 03/20/2019 FINDINGS: Right-sided PICC in stable position. No significant interval change in the size of the pleural effusion or associated bibasilar atelectasis/infiltrate since the prior radiograph. No pneumothorax. Stable area of nodular density the left mid lung field as seen previously. Stable cardiomegaly. Median sternotomy wires and postsurgical changes of CABG. No acute osseous pathology. IMPRESSION: 1. No significant interval change in the size  of the pleural effusions and associated bibasilar atelectasis/infiltrate. 2. Stable cardiomegaly. Electronically Signed   By: Anner Crete M.D.   On: 03/21/2019 16:01   Ct Head Code Stroke Wo Contrast Result Date: 03/22/2019 CLINICAL DATA:  Code stroke. Altered level of consciousness (LOC), unexplained. Confusion. EXAM: CT HEAD WITHOUT CONTRAST TECHNIQUE: Contiguous axial images were obtained from the base of the skull through the vertex without intravenous contrast. COMPARISON:  Head CT 03/16/2019, CT angiogram head 03/14/2019, brain MRI 04/18/2018, CT angiogram head/neck 10/23/2017 FINDINGS: Brain: Unchanged appearance of a subacute right ACA as well as ACA/MCA watershed territory  infarction. No new demarcated cortical infarction is identified. No evidence of intracranial hemorrhage. No midline shift or extra-axial fluid collection. Stable generalized parenchymal atrophy and chronic small vessel ischemic disease. Partially empty sella turcica. Vascular: No hyperdense vessel.  Atherosclerotic calcification. Skull: No calvarial fracture or suspicious osseous lesion. Sinuses/Orbits: Minimal scattered paranasal sinus mucosal thickening at the imaged levels. Small amount of frothy secretions within a posterior left ethmoid air cell. No significant mastoid effusion. These results were communicated to Dr. Leonie Man At 5:06 pmon 10/14/2020by text page via the [facility] messaging system. IMPRESSION: 1. Unchanged appearance of subacute right ACA as well as ACA/MCA watershed territory infarcts. No new acute infarction is demonstrated. No intracranial hemorrhage. 2. Stable atrophy and chronic small vessel ischemic disease. Electronically Signed   By: Kellie Simmering   On: 03/22/2019 17:13    Assessment and Plan:   1. Persistent AFib     CHA2DS2Vasc is 3, on Eliquis  Rate has been well controlled for a few days, late last night while positioning in bed (andhe says likely holding his breath) he has a symptomatic 14.5 second pause/asystolic event associated with brief syncope. There was V rate slowing prior to the event making this likely vagal  His amiodarone has been previously reduced We decreased his metoprolol form 50mg  BID to 12.5mg  BID this AM (appeasrs to haave subsequently been stopped as well as the amiodarone by CTS service)  No pacer for now Alaura Schippers defer medication management to CTS and primary cardiology team Follow telemetry off medicines  Dr. Curt Bears has seen and examined the patient EP Felita Bump sign off though remain available, please recall if needed    For questions or updates, please contact [place] Please consult [email or web address] for contact info under     Signed, Baldwin Jamaica, PA-C  03/24/2019 9:47 AM  I have seen and examined this patient with Tommye Standard.  Agree with above, note added to reflect my findings.  On exam, RRR, no murmurs, lungs clear.  Patient 14 days status post CABG.  He also has a history of atrial fibrillation.  He is currently on amiodarone and metoprolol.  Yesterday evening, he was being moved in the bed, pulled up.  The patient was holding his breath and had a 14.5-second episode of asystole.  Since that time, his metoprolol has been held and his amiodarone has been decreased to 200 mg a day.  He remains in atrial fibrillation with good rate control.  At this point, would hold off on pacemaker implant.  It could certainly be a vagal reaction that caused him to have such bradycardia in combination with metoprolol.  Agree with holding metoprolol at this time.  Mekiah Wahler M. Cleaven Demario MD 03/24/2019 10:59 AM

## 2019-03-24 NOTE — Progress Notes (Signed)
Physical Therapy Treatment Patient Details Name: Martin [name] MRN: [id number] DOB: 11/07/1955 Today's Date: 03/24/2019    History of Present Illness Pt is a 63 y/o male with PMH of  CAD, paroxysmal atrial fibrillation, OA, low back pain, polysubstance abuse, presents with chest pain and possible NSTEMI. S/p L heart cath and coronary angiography 9/30, CABG x 3 03/10/19.  Course complicated by CT revealing R distal ACA infarct 03/13/19. Pt with periods of lethargy; EEG WNL.    PT Comments    Patient seen for mobility progression. Pt up in recliner upon arrival and wife present. Pt is eager to mobilize and is making good progress toward PT goals. Pt tolerated gait training distance of 65 ft total with min/mod A (+2 for chair follow not physical assistance). VSS. Pt continues to be a good candidate for CIR level therapies.     Follow Up Recommendations  CIR;Supervision/Assistance - 24 hour     Equipment Recommendations  Other (comment)(defer to CIR)    Recommendations for Other Services       Precautions / Restrictions Precautions Precautions: Fall;Sternal Precaution Booklet Issued: No Precaution Comments: Lft hemiparesis Restrictions Weight Bearing Restrictions: Yes Other Position/Activity Restrictions: sternal precautions     Mobility  Bed Mobility               General bed mobility comments: pt OOB in chair upon arrival   Transfers Overall transfer level: Needs assistance Equipment used: Rolling walker (2 wheeled) Transfers: Sit to/from Stand Sit to Stand: Min assist         General transfer comment: assist to power up   Ambulation/Gait Ambulation/Gait assistance: +2 safety/equipment;Mod assist;Min assist Gait Distance (Feet): (65 ft total with seated rest break) Assistive device: Rolling walker (2 wheeled) Gait Pattern/deviations: Step-to pattern;Decreased step length - left;Decreased stance time - left;Step-through pattern Gait velocity: decreased    General Gait Details: cues for sequencing, breathing technique, and increased L step length; pt with improved ability to advance L LE   Stairs             Wheelchair Mobility    Modified Rankin (Stroke Patients Only)       Balance Overall balance assessment: Needs assistance Sitting-balance support: Feet supported;No upper extremity supported Sitting balance-Leahy Scale: Fair     Standing balance support: Bilateral upper extremity supported;No upper extremity supported;During functional activity Standing balance-Leahy Scale: Poor                              Cognition Arousal/Alertness: Awake/alert Behavior During Therapy: WFL for tasks assessed/performed Overall Cognitive Status: Within Functional Limits for tasks assessed                                 General Comments: pt demonstrated carry over from previous session; still needs cues to maintain sternal precautions with transfers      Exercises      General Comments General comments (skin integrity, edema, etc.): VSS      Pertinent Vitals/Pain Pain Assessment: Faces Faces Pain Scale: Hurts a little bit Pain Location: Sternum Pain Descriptors / Indicators: Discomfort;Guarding Pain Intervention(s): Limited activity within patient's tolerance;Monitored during session;Repositioned    Home Living                      Prior Function  PT Goals (current goals can now be found in the care plan section) Acute Rehab PT Goals Patient Stated Goal: "Get home to my grandkids" Progress towards PT goals: Progressing toward goals    Frequency    Min 4X/week      PT Plan Current plan remains appropriate    Co-evaluation              AM-PAC PT "6 Clicks" Mobility   Outcome Measure  Help needed turning from your back to your side while in a flat bed without using bedrails?: A Lot Help needed moving from lying on your back to sitting on the side of a  flat bed without using bedrails?: A Lot Help needed moving to and from a bed to a chair (including a wheelchair)?: A Lot Help needed standing up from a chair using your arms (e.g., wheelchair or bedside chair)?: A Little Help needed to walk in hospital room?: A Lot Help needed climbing 3-5 steps with a railing? : Total 6 Click Score: 12    End of Session Equipment Utilized During Treatment: Gait belt Activity Tolerance: Patient tolerated treatment well Patient left: with call bell/phone within reach;in chair;with chair alarm set;with family/visitor present Nurse Communication: Mobility status PT Visit Diagnosis: Muscle weakness (generalized) (M62.81);Hemiplegia and hemiparesis;Other abnormalities of gait and mobility (R26.89) Hemiplegia - Right/Left: Left Hemiplegia - dominant/non-dominant: Non-dominant Hemiplegia - caused by: Cerebral infarction     Time: 1400-1426 PT Time Calculation (min) (ACUTE ONLY): 26 min  Charges:  $Gait Training: 23-37 mins                     Earney Navy, PTA Acute Rehabilitation Services Pager: [phone] Office: [phone]     Darliss Cheney 03/24/2019, 4:27 PM

## 2019-03-24 NOTE — Progress Notes (Addendum)
Inpatient Rehabilitation-Admissions Coordinator   Still awaiting insurance determination for CIR. Will follow up Monday.   Jhonnie Garner, OTR/L  Rehab Admissions Coordinator  [phone] 03/24/2019 4:26 PM

## 2019-03-25 LAB — GLUCOSE, CAPILLARY
Glucose-Capillary: 91 mg/dL (ref 70–99)
Glucose-Capillary: 196 mg/dL — ABNORMAL HIGH (ref 70–99)
Glucose-Capillary: 184 mg/dL — ABNORMAL HIGH (ref 70–99)
Glucose-Capillary: 107 mg/dL — ABNORMAL HIGH (ref 70–99)

## 2019-03-25 MED ORDER — POLYETHYLENE GLYCOL 3350 17 G PO PACK: 17.0000 g | PACK | Freq: Every day | ORAL | Status: DC | PRN

## 2019-03-25 MED ORDER — TAMSULOSIN HCL 0.4 MG PO CAPS
0.4000 mg | ORAL_CAPSULE | Freq: Every day | ORAL | Status: DC
  Administered 2019-03-25 – 2019-03-26 (×2): 0.4 mg via ORAL
  Filled 2019-03-25 (×2): qty 1

## 2019-03-25 MED ORDER — METOPROLOL TARTRATE 12.5 MG HALF TABLET: 12.5000 mg | ORAL_TABLET | Freq: Every morning | ORAL | Status: DC

## 2019-03-25 MED ORDER — AMIODARONE HCL 200 MG PO TABS: 200.0000 mg | ORAL_TABLET | Freq: Every day | ORAL | Status: DC

## 2019-03-25 MED ORDER — AMIODARONE HCL 200 MG PO TABS
200.0000 mg | ORAL_TABLET | Freq: Every day | ORAL | Status: DC
  Administered 2019-03-26 – 2019-03-27 (×2): 200 mg via ORAL
  Filled 2019-03-25 (×2): qty 1

## 2019-03-25 NOTE — Progress Notes (Signed)
Ambulating per pt request with 2 staff members. Pt became weak, provided a chair to sit and had an assisted fall to the floor. Steady use to get in bed. No visible injuries at this time. MD notified waiting for a return call. Will continue to monitor

## 2019-03-25 NOTE — Plan of Care (Signed)

## 2019-03-25 NOTE — Progress Notes (Addendum)
      [address]       ,[place] [address]             [phone]      15 Days Post-Op Procedure(s) (LRB): CORONARY ARTERY BYPASS GRAFTING (CABG)X 3 Using Left Internal Mammary Artery and Right Saphenous Vein Grafts (N/A) TRANSESOPHAGEAL ECHOCARDIOGRAM (TEE) (N/A)   Subjective:  No new complaints.  Wants to get to rehab  Objective: Vital signs in last 24 hours: Temp:  [97.5 F (36.4 C)-98.8 F (37.1 C)] 98.8 F (37.1 C) (10/17 1045) Pulse Rate:  [63-85] 73 (10/17 1045) Cardiac Rhythm: Atrial fibrillation (10/17 0030) Resp:  [14-24] 18 (10/17 1045) BP: (120-131)/(71-86) 130/77 (10/17 1045) SpO2:  [95 %-99 %] 95 % (10/17 1045) FiO2 (%):  [40 %] 40 % (10/16 2035) Weight:  [120.4 kg] 120.4 kg (10/17 0303)  Intake/Output from previous day: 10/16 0701 - 10/17 0700 In: 66 [P.O.:480; I.V.:10] Out: 3700 [Urine:3700] Intake/Output this shift: Total I/O In: 240 [P.O.:240] Out: -   General appearance: alert, cooperative and no distress Heart: regular rate and rhythm Lungs: clear to auscultation bilaterally Abdomen: soft, non-tender; bowel sounds normal; no masses,  no organomegaly Extremities: edema trace Wound: clean and dry  Lab Results: Recent Labs    03/24/19 0335  WBC 10.8*  HGB 11.1*  HCT 33.7*  PLT 319   BMET:  Recent Labs    03/24/19 0335  NA 129*  K 3.8  CL 94*  CO2 26  GLUCOSE 99  BUN 14  CREATININE 0.77  CALCIUM 8.5*    PT/INR: No results for input(s): LABPROT, INR in the last 72 hours. ABG    Component Value Date/Time   PHART 7.478 (H) 03/16/2019 1239   HCO3 24.2 03/16/2019 1239   TCO2 25 03/16/2019 1239   ACIDBASEDEF 3.0 (H) 03/10/2019 1833   O2SAT 62.5 03/18/2019 0430   CBG (last 3)  Recent Labs    03/24/19 1700 03/24/19 2123 03/25/19 0629  GLUCAP 145* 109* 91    Assessment/Plan: S/P Procedure(s) (LRB): CORONARY ARTERY BYPASS GRAFTING (CABG)X 3 Using Left Internal Mammary Artery and Right Saphenous Vein  Grafts (N/A) TRANSESOPHAGEAL ECHOCARDIOGRAM (TEE) (N/A)  1. CV- NSR, improved after reduction in medications 2. Pulm- no acute issues, continue IS 3. Renal- creatinine has been stable, continue Lasix 4. ID- remains afebrile, leukocytosis resolved, all cultures negative- on oral ABX per Dr. Prescott Gum 6. Deconditioning- due to post operative stroke, working with PT/OT very motivated to get better, he is ready for d/c to CIR if we can get insurance authorization 7. Dispo- patient stable, ready for d/c once insurance authorization can be obtained  LOS: 18 days    Erin Barrett 03/25/2019   titrating meds for afib- EP rec holding beta-blocker due to low HR, pause Cont low dose amiodarone 200/day Cont eliquis WBC now normal- stop antibiotics after today Start flomax for BPH  patient examined and medical record reviewed,agree with above note. Tharon Aquas Trigt III 03/25/2019

## 2019-03-25 NOTE — Progress Notes (Addendum)
CARDIAC REHAB PHASE I   1230-1300 Patient up in chair. Per PT, patient will not be seen today for ambulation. Patient awaiting transfer to [name] Monday pending insurance approval. Some discharge education completed today including CAD risk factor reduction (spent much time reviewing [place] use/abuse), diet, exercise/activity progression post discharge from [place], sternal precautions, and introductions to phase 2 and Virtual CR. Will await placement of phase 2 orders til Monday. Spoke with wife via telephone as CR RN unable to find [facility] educational booklet. Per wife booklet is at home. She will review and discuss questions with nursing staff. CR will follow up with patient Monday.  Willadean Carol, RN, BSN

## 2019-03-25 NOTE — Progress Notes (Addendum)
Physical Therapy Treatment Patient Details Name: Martin [name] MRN: [id number] DOB: 05-07-1956 Today's Date: 03/25/2019    History of Present Illness Pt is a 63 y/o male with PMH of  CAD, paroxysmal atrial fibrillation, OA, low back pain, polysubstance abuse, presents with chest pain and possible NSTEMI. S/p L heart cath and coronary angiography 9/30, CABG x 3 03/10/19.  Course complicated by CT revealing R distal ACA infarct 03/13/19. Pt with periods of lethargy; EEG WNL.    PT Comments    Pt seated in recliner and agreeable to get up and ambulate with therapy today. Pt requires minA for steadying with sit>stand transfers, and is hands on min guard for ambulation of 90 feet with RW. Pt requires 1x standing rest break and 1x seated rest break. Pt is looking forward to d/c to CIR to continue to progress his mobility. PT will continue to follow acutely to progress mobility.   Follow Up Recommendations  CIR;Supervision/Assistance - 24 hour     Equipment Recommendations  Other (comment)(defer to CIR)       Precautions / Restrictions Precautions Precautions: Fall;Sternal Precaution Booklet Issued: No Precaution Comments: Lft hemiparesis Restrictions Weight Bearing Restrictions: Yes Other Position/Activity Restrictions: sternal precautions     Mobility  Bed Mobility               General bed mobility comments: pt OOB in chair upon arrival   Transfers Overall transfer level: Needs assistance Equipment used: Rolling walker (2 wheeled) Transfers: Sit to/from Stand Sit to Stand: Min assist         General transfer comment: assist to power up 3x during session    Ambulation/Gait Ambulation/Gait assistance: +2 safety/equipment;Mod assist;Min assist Gait Distance (Feet): 90 Feet(with one standing and one seated rest break) Assistive device: Rolling walker (2 wheeled) Gait Pattern/deviations: Step-to pattern;Decreased step length - left;Decreased stance time -  left;Step-through pattern Gait velocity: decreased Gait velocity interpretation: <1.8 ft/sec, indicate of risk for recurrent falls General Gait Details: hands on min guard for safety, vc for breathing and for picking up L foot to advance          Balance Overall balance assessment: Needs assistance Sitting-balance support: Feet supported;No upper extremity supported Sitting balance-Leahy Scale: Fair     Standing balance support: Bilateral upper extremity supported;No upper extremity supported;During functional activity Standing balance-Leahy Scale: Poor                              Cognition Arousal/Alertness: Awake/alert Behavior During Therapy: WFL for tasks assessed/performed Overall Cognitive Status: Within Functional Limits for tasks assessed                                 General Comments: able to transfer without vc for hand placement      Exercises      General Comments General comments (skin integrity, edema, etc.): max HR with ambulation 102 bpm      Pertinent Vitals/Pain Pain Assessment: Faces Faces Pain Scale: Hurts a little bit Pain Location: Sternum Pain Descriptors / Indicators: Discomfort;Guarding;Moaning Pain Intervention(s): Limited activity within patient's tolerance;Monitored during session;Repositioned           PT Goals (current goals can now be found in the care plan section) Acute Rehab PT Goals Patient Stated Goal: "Get home to my grandkids" PT Goal Formulation: With patient Time For Goal Achievement: 03/28/19 Potential to Achieve  Goals: Good Progress towards PT goals: Progressing toward goals    Frequency    Min 4X/week      PT Plan Current plan remains appropriate       AM-PAC PT "6 Clicks" Mobility   Outcome Measure  Help needed turning from your back to your side while in a flat bed without using bedrails?: A Lot Help needed moving from lying on your back to sitting on the side of a flat bed  without using bedrails?: A Lot Help needed moving to and from a bed to a chair (including a wheelchair)?: A Lot Help needed standing up from a chair using your arms (e.g., wheelchair or bedside chair)?: A Little Help needed to walk in hospital room?: A Lot Help needed climbing 3-5 steps with a railing? : Total 6 Click Score: 12    End of Session Equipment Utilized During Treatment: Gait belt Activity Tolerance: Patient tolerated treatment well Patient left: with call bell/phone within reach;in chair;with chair alarm set;with family/visitor present Nurse Communication: Mobility status PT Visit Diagnosis: Muscle weakness (generalized) (M62.81);Hemiplegia and hemiparesis;Other abnormalities of gait and mobility (R26.89) Hemiplegia - Right/Left: Left Hemiplegia - dominant/non-dominant: Non-dominant Hemiplegia - caused by: Cerebral infarction     Time: [id number] PT Time Calculation (min) (ACUTE ONLY): 23 min  Charges:  $Gait Training: 23-37 mins                     Denzil Mceachron B. Migdalia Dk PT, DPT Acute Rehabilitation Services Pager [phone] Office [phone]    [place] 03/25/2019, 2:33 PM

## 2019-03-25 NOTE — Progress Notes (Signed)
RT helped pt w/CPAP placement, and filled reservoir with sterile H2O. Pt on home machine w/home setting of 12cmH2O w/3 Lpm bled into the system. Pt respiratory status stable at this time. RT will continue to monitor.

## 2019-03-25 NOTE — TOC Progression Note (Addendum)
Transition of Care [facility]) - Progression Note    Patient Details  Name: Martin [name] MRN: [id number] Date of Birth: [birth year]  [facility] of [facility]) CM/SW Contact  Zenon Mayo, RN Phone Number: 03/25/2019, 10:26 AM  Clinical Narrative:    NCM received voicemail from [facility] from [facility] working on prior [place] for CIR- 1 [phone] on 10/17 10:07 stating  If MD wants to do a peer to peer for inpatient rehab he needs to call [phone] opt 5 , the deadline is Monday Oct 19 at noon.  NCM notified MD of this information.  NCM also notified Clyda Greener working with patient today.   Expected Discharge Plan: IP Rehab Facility Barriers to Discharge: Continued Medical Work up  Expected Discharge Plan and Services Expected Discharge Plan: [place] In-house Referral: NA Discharge Planning Services: CM Consult Post Acute Care Choice: IP Rehab Living arrangements for the past 2 months: Single Family Home                 DME Arranged: N/A DME Agency: NA       HH Arranged: NA HH Agency: NA         Social Determinants of Health (SDOH) Interventions    Readmission Risk Interventions No flowsheet data found.

## 2019-03-26 ENCOUNTER — Inpatient Hospital Stay (HOSPITAL_COMMUNITY): Payer: Medicare Other

## 2019-03-26 ENCOUNTER — Inpatient Hospital Stay (HOSPITAL_COMMUNITY): Payer: Medicare Other | Admitting: Speech Pathology

## 2019-03-26 LAB — GLUCOSE, CAPILLARY
Glucose-Capillary: 83 mg/dL (ref 70–99)
Glucose-Capillary: 146 mg/dL — ABNORMAL HIGH (ref 70–99)
Glucose-Capillary: 146 mg/dL — ABNORMAL HIGH (ref 70–99)
Glucose-Capillary: 184 mg/dL — ABNORMAL HIGH (ref 70–99)

## 2019-03-26 LAB — CULTURE, BLOOD (SINGLE)
Culture: NO GROWTH
Special Requests: ADEQUATE

## 2019-03-26 MED ORDER — METOPROLOL TARTRATE 12.5 MG HALF TABLET
12.5000 mg | ORAL_TABLET | Freq: Every morning | ORAL | Status: DC
  Administered 2019-03-27: 12.5 mg via ORAL
  Filled 2019-03-26: qty 1

## 2019-03-26 NOTE — Progress Notes (Addendum)
      [address]       [place] [address]             [phone]      16 Days Post-Op Procedure(s) (LRB): CORONARY ARTERY BYPASS GRAFTING (CABG)X 3 Using Left Internal Mammary Artery and Right Saphenous Vein Grafts (N/A) TRANSESOPHAGEAL ECHOCARDIOGRAM (TEE) (N/A)   Subjective:  Doing well, no complaints.  Continues to voice wanting to get to rehab.   Objective: Vital signs in last 24 hours: Temp:  [97.6 F (36.4 C)-99.1 F (37.3 C)] 98.6 F (37 C) (10/18 0810) Pulse Rate:  [70-100] 78 (10/18 0810) Cardiac Rhythm: Atrial fibrillation (10/18 0704) Resp:  [15-24] 20 (10/18 0730) BP: (113-185)/(63-87) 114/64 (10/18 0810) SpO2:  [92 %-97 %] 93 % (10/18 0815) FiO2 (%):  [21 %] 21 % (10/18 0801) Weight:  [122.1 kg] 122.1 kg (10/18 0100)  Intake/Output from previous day: 10/17 0701 - 10/18 0700 In: 1220 [P.O.:1220] Out: 1175 [Urine:1175]  General appearance: alert, cooperative and no distress Heart: regular rate and rhythm Lungs: clear to auscultation bilaterally Abdomen: soft, non-tender; bowel sounds normal; no masses,  no organomegaly Extremities: edema trace Wound: clean and dry  Lab Results: Recent Labs    03/24/19 0335  WBC 10.8*  HGB 11.1*  HCT 33.7*  PLT 319   BMET:  Recent Labs    03/24/19 0335  NA 129*  K 3.8  CL 94*  CO2 26  GLUCOSE 99  BUN 14  CREATININE 0.77  CALCIUM 8.5*    PT/INR: No results for input(s): LABPROT, INR in the last 72 hours. ABG    Component Value Date/Time   PHART 7.478 (H) 03/16/2019 1239   HCO3 24.2 03/16/2019 1239   TCO2 25 03/16/2019 1239   ACIDBASEDEF 3.0 (H) 03/10/2019 1833   O2SAT 62.5 03/18/2019 0430   CBG (last 3)  Recent Labs    03/25/19 1536 03/25/19 2106 03/26/19 0625  GLUCAP 184* 107* 83    Assessment/Plan: S/P Procedure(s) (LRB): CORONARY ARTERY BYPASS GRAFTING (CABG)X 3 Using Left Internal Mammary Artery and Right Saphenous Vein Grafts (N/A) TRANSESOPHAGEAL ECHOCARDIOGRAM  (TEE) (N/A)  1. CV- NSR, BB on hold, Amiodarone restarted on reduced dose, continue Eliquis 2. Pulm- no acute issues, continue IS 3. Renal- creatinine stable, continue Lasix 4. GU- urinary retention on flomax, patient will be discharged with a foley, bladder training as able 5. Deconditioning- post stroke with residual deficits/weakness, works with PT/OT.Marland Kitchen patients rehabilitation is being held back waiting on insurance authorization for CIR.Marland Kitchen peer to peer to be completed to hopefully achieve authorization 6. Dispo- patient is medically stable for discharge to CIR, awaiting insurance aurthorization   LOS: 19 days    Ellwood Handler 03/26/2019  Doing well with controlled atrial fibrillation.  No episodes of bradycardia cardia or pauses.  We will resume low-dose metoprolol tomorrow  I spoke with patients insurer-peer to peer and approval for CIR was obtained patient examined and medical record reviewed,agree with above note. Tharon Aquas Trigt III 03/26/2019

## 2019-03-26 NOTE — Progress Notes (Signed)
Physical Therapy Treatment Patient Details Name: Martin [name] MRN: [id number] DOB: [birth year] Today's Date: 03/26/2019    History of Present Illness Pt is a 63 y/o male with PMH of  CAD, paroxysmal atrial fibrillation, OA, low back pain, polysubstance abuse, presents with chest pain and possible NSTEMI. S/p L heart cath and coronary angiography 9/30, CABG x 3 03/10/19.  Course complicated by CT revealing R distal ACA infarct 03/13/19. Pt with periods of lethargy; EEG WNL.    PT Comments    Patient progressing with ambulation, strength and balance.  Fearful initially due to reports fall last evening, but able to demonstrate improved stepping on L with cues, but still fatigues.  Feel he remains appropriate for inpatient rehab for follow up skilled PT prior to d/c home.  Wife present today, supportive and pushed his chair in hallway.  Will follow acutely.    Follow Up Recommendations  CIR;Supervision/Assistance - 24 hour     Equipment Recommendations  Other (comment)(TBA)    Recommendations for Other Services       Precautions / Restrictions Precautions Precautions: Fall Precaution Comments: Lft hemiparesis    Mobility  Bed Mobility Overal bed mobility: Needs Assistance Bed Mobility: Rolling;Sidelying to Sit;Sit to Sidelying Rolling: Max assist Sidelying to sit: Mod assist;HOB elevated     Sit to sidelying: Mod assist General bed mobility comments: cues for technique and to hug pillow to chest, assist for rolling to R flexed L knee for him and turned hips, he got feet off bed and had help to lift trunk,  sit to side assist for legs onto bed  Transfers Overall transfer level: Needs assistance Equipment used: Rolling walker (2 wheeled) Transfers: Sit to/from Stand Sit to Stand: Min assist;Mod assist         General transfer comment: used momentum, assist for L foot placement, cues for using legs not arms with hads on walker; mostly min A once increased lifting  help  Ambulation/Gait Ambulation/Gait assistance: +2 safety/equipment;Mod assist;Min assist Gait Distance (Feet): 90 Feet(x 2) Assistive device: Rolling walker (2 wheeled) Gait Pattern/deviations: Step-to pattern;Step-through pattern;Decreased step length - left;Shuffle;Decreased dorsiflexion - left;Wide base of support     General Gait Details: cues for breathing, L foot step length and foot clearance and to slow down due to quickly shuffling feet; stopped twice to stand and breathe and twice to sit in chair and rest in hallway   Stairs             Wheelchair Mobility    Modified Rankin (Stroke Patients Only) Modified Rankin (Stroke Patients Only) Pre-Morbid Rankin Score: No significant disability Modified Rankin: Moderately severe disability     Balance Overall balance assessment: Needs assistance;History of Falls Sitting-balance support: Feet supported Sitting balance-Leahy Scale: Fair     Standing balance support: Bilateral upper extremity supported Standing balance-Leahy Scale: Poor Standing balance comment: reports fall last evening walking with nursing                            Cognition Arousal/Alertness: Awake/alert Behavior During Therapy: WFL for tasks assessed/performed Overall Cognitive Status: Within Functional Limits for tasks assessed                                        Exercises      General Comments General comments (skin integrity, edema, etc.): max HR 102, SpO2  on 3-4L O2 98% with cues for breathing throughout      Pertinent Vitals/Pain Pain Assessment: Faces Faces Pain Scale: Hurts even more Pain Location: back and chest Pain Descriptors / Indicators: Aching;Sore Pain Intervention(s): Monitored during session;Repositioned;Patient requesting pain meds-RN notified    Home Living                      Prior Function            PT Goals (current goals can now be found in the care plan section)  Progress towards PT goals: Progressing toward goals    Frequency    Min 4X/week      PT Plan      Co-evaluation              AM-PAC PT "6 Clicks" Mobility   Outcome Measure  Help needed turning from your back to your side while in a flat bed without using bedrails?: Total Help needed moving from lying on your back to sitting on the side of a flat bed without using bedrails?: A Lot Help needed moving to and from a bed to a chair (including a wheelchair)?: A Lot Help needed standing up from a chair using your arms (e.g., wheelchair or bedside chair)?: A Lot Help needed to walk in hospital room?: A Lot Help needed climbing 3-5 steps with a railing? : Total 6 Click Score: 10    End of Session Equipment Utilized During Treatment: Gait belt;Oxygen Activity Tolerance: Patient limited by fatigue Patient left: in bed;with call bell/phone within reach;with family/visitor present   PT Visit Diagnosis: Muscle weakness (generalized) (M62.81);Hemiplegia and hemiparesis;Other abnormalities of gait and mobility (R26.89) Hemiplegia - Right/Left: Left Hemiplegia - dominant/non-dominant: Non-dominant Hemiplegia - caused by: Cerebral infarction     Time: [id number] PT Time Calculation (min) (ACUTE ONLY): 47 min  Charges:  $Gait Training: 23-37 mins $Therapeutic Activity: 8-22 mins                     Martin [name], PT Acute Rehabilitation Services [phone] 03/26/2019    Martin [name] 03/26/2019, 5:59 PM

## 2019-03-26 NOTE — Plan of Care (Signed)
  Problem: Education: Goal: Knowledge of General Education information will improve Description: Including pain rating scale, medication(s)/side effects and non-pharmacologic comfort measures Outcome: Progressing   Problem: Health Behavior/Discharge Planning: Goal: Ability to manage health-related needs will improve Outcome: Progressing   Problem: Clinical Measurements: Goal: Ability to maintain clinical measurements within normal limits will improve Outcome: Progressing Goal: Will remain free from infection Outcome: Progressing Goal: Diagnostic test results will improve Outcome: Progressing Goal: Respiratory complications will improve Outcome: Progressing Goal: Cardiovascular complication will be avoided Outcome: Progressing   Problem: Activity: Goal: Risk for activity intolerance will decrease Outcome: Progressing   Problem: Nutrition: Goal: Adequate nutrition will be maintained Outcome: Progressing   Problem: Coping: Goal: Level of anxiety will decrease Outcome: Progressing   Problem: Elimination: Goal: Will not experience complications related to bowel motility Outcome: Progressing Goal: Will not experience complications related to urinary retention Outcome: Progressing   Problem: Pain Managment: Goal: General experience of comfort will improve Outcome: Progressing   Problem: Safety: Goal: Ability to remain free from injury will improve Outcome: Progressing   Problem: Skin Integrity: Goal: Risk for impaired skin integrity will decrease Outcome: Progressing   Problem: Education: Goal: Will demonstrate proper wound care and an understanding of methods to prevent future damage Outcome: Progressing Goal: Knowledge of disease or condition will improve Outcome: Progressing Goal: Knowledge of the prescribed therapeutic regimen will improve Outcome: Progressing Goal: Individualized Educational Video(s) Outcome: Progressing   Problem: Activity: Goal: Risk for  activity intolerance will decrease Outcome: Progressing   Problem: Cardiac: Goal: Will achieve and/or maintain hemodynamic stability Outcome: Progressing   Problem: Clinical Measurements: Goal: Postoperative complications will be avoided or minimized Outcome: Progressing   Problem: Skin Integrity: Goal: Wound healing without signs and symptoms of infection Outcome: Progressing Goal: Risk for impaired skin integrity will decrease Outcome: Progressing   Problem: Nutrition: Goal: Dietary intake will improve Outcome: Progressing   Problem: Ischemic Stroke/TIA Tissue Perfusion: Goal: Complications of ischemic stroke/TIA will be minimized Outcome: Progressing

## 2019-03-27 ENCOUNTER — Encounter (HOSPITAL_COMMUNITY): Payer: Self-pay

## 2019-03-27 ENCOUNTER — Inpatient Hospital Stay (HOSPITAL_COMMUNITY)
Admission: RE | Admit: 2019-03-27 | Discharge: 2019-04-11 | DRG: 056 | Disposition: A | Discharge status: Home Health | Payer: Medicare Other | Source: Intra-hospital | Attending: Physical Medicine and Rehabilitation | Admitting: Physical Medicine and Rehabilitation

## 2019-03-27 ENCOUNTER — Other Ambulatory Visit: Payer: Self-pay

## 2019-03-27 DIAGNOSIS — D72829 Elevated white blood cell count, unspecified: Secondary | ICD-10-CM | POA: Diagnosis present

## 2019-03-27 DIAGNOSIS — I63421 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: Secondary | ICD-10-CM | POA: Diagnosis not present

## 2019-03-27 DIAGNOSIS — E8809 Other disorders of plasma-protein metabolism, not elsewhere classified: Secondary | ICD-10-CM

## 2019-03-27 DIAGNOSIS — G4700 Insomnia, unspecified: Secondary | ICD-10-CM | POA: Diagnosis present

## 2019-03-27 DIAGNOSIS — M109 Gout, unspecified: Secondary | ICD-10-CM | POA: Diagnosis not present

## 2019-03-27 DIAGNOSIS — M10071 Idiopathic gout, right ankle and foot: Secondary | ICD-10-CM | POA: Diagnosis not present

## 2019-03-27 DIAGNOSIS — I214 Non-ST elevation (NSTEMI) myocardial infarction: Secondary | ICD-10-CM | POA: Diagnosis not present

## 2019-03-27 DIAGNOSIS — D62 Acute posthemorrhagic anemia: Secondary | ICD-10-CM | POA: Diagnosis present

## 2019-03-27 DIAGNOSIS — G4733 Obstructive sleep apnea (adult) (pediatric): Secondary | ICD-10-CM | POA: Diagnosis not present

## 2019-03-27 DIAGNOSIS — I5032 Chronic diastolic (congestive) heart failure: Secondary | ICD-10-CM | POA: Diagnosis not present

## 2019-03-27 DIAGNOSIS — I5030 Unspecified diastolic (congestive) heart failure: Secondary | ICD-10-CM | POA: Diagnosis not present

## 2019-03-27 DIAGNOSIS — R339 Retention of urine, unspecified: Secondary | ICD-10-CM | POA: Diagnosis not present

## 2019-03-27 DIAGNOSIS — Z79899 Other long term (current) drug therapy: Secondary | ICD-10-CM

## 2019-03-27 DIAGNOSIS — I480 Paroxysmal atrial fibrillation: Secondary | ICD-10-CM | POA: Diagnosis present

## 2019-03-27 DIAGNOSIS — R0902 Hypoxemia: Secondary | ICD-10-CM | POA: Diagnosis not present

## 2019-03-27 DIAGNOSIS — R609 Edema, unspecified: Secondary | ICD-10-CM

## 2019-03-27 DIAGNOSIS — Z23 Encounter for immunization: Secondary | ICD-10-CM | POA: Diagnosis not present

## 2019-03-27 DIAGNOSIS — Z7951 Long term (current) use of inhaled steroids: Secondary | ICD-10-CM | POA: Diagnosis not present

## 2019-03-27 DIAGNOSIS — E871 Hypo-osmolality and hyponatremia: Secondary | ICD-10-CM | POA: Diagnosis not present

## 2019-03-27 DIAGNOSIS — I5031 Acute diastolic (congestive) heart failure: Secondary | ICD-10-CM | POA: Diagnosis not present

## 2019-03-27 DIAGNOSIS — F411 Generalized anxiety disorder: Secondary | ICD-10-CM | POA: Diagnosis present

## 2019-03-27 DIAGNOSIS — E785 Hyperlipidemia, unspecified: Secondary | ICD-10-CM | POA: Diagnosis not present

## 2019-03-27 DIAGNOSIS — E46 Unspecified protein-calorie malnutrition: Secondary | ICD-10-CM | POA: Diagnosis not present

## 2019-03-27 DIAGNOSIS — Z951 Presence of aortocoronary bypass graft: Secondary | ICD-10-CM | POA: Diagnosis not present

## 2019-03-27 DIAGNOSIS — Z87891 Personal history of nicotine dependence: Secondary | ICD-10-CM | POA: Diagnosis not present

## 2019-03-27 DIAGNOSIS — R5381 Other malaise: Secondary | ICD-10-CM | POA: Diagnosis not present

## 2019-03-27 DIAGNOSIS — Z955 Presence of coronary angioplasty implant and graft: Secondary | ICD-10-CM

## 2019-03-27 DIAGNOSIS — I69354 Hemiplegia and hemiparesis following cerebral infarction affecting left non-dominant side: Secondary | ICD-10-CM | POA: Diagnosis not present

## 2019-03-27 DIAGNOSIS — I2510 Atherosclerotic heart disease of native coronary artery without angina pectoris: Secondary | ICD-10-CM | POA: Diagnosis present

## 2019-03-27 DIAGNOSIS — Z791 Long term (current) use of non-steroidal anti-inflammatories (NSAID): Secondary | ICD-10-CM | POA: Diagnosis not present

## 2019-03-27 DIAGNOSIS — R0602 Shortness of breath: Secondary | ICD-10-CM

## 2019-03-27 DIAGNOSIS — Z7901 Long term (current) use of anticoagulants: Secondary | ICD-10-CM | POA: Diagnosis not present

## 2019-03-27 DIAGNOSIS — I10 Essential (primary) hypertension: Secondary | ICD-10-CM

## 2019-03-27 DIAGNOSIS — I110 Hypertensive heart disease with heart failure: Secondary | ICD-10-CM | POA: Diagnosis not present

## 2019-03-27 DIAGNOSIS — E876 Hypokalemia: Secondary | ICD-10-CM | POA: Diagnosis not present

## 2019-03-27 DIAGNOSIS — J449 Chronic obstructive pulmonary disease, unspecified: Secondary | ICD-10-CM | POA: Diagnosis present

## 2019-03-27 DIAGNOSIS — J90 Pleural effusion, not elsewhere classified: Secondary | ICD-10-CM | POA: Diagnosis not present

## 2019-03-27 DIAGNOSIS — R0989 Other specified symptoms and signs involving the circulatory and respiratory systems: Secondary | ICD-10-CM

## 2019-03-27 LAB — GLUCOSE, CAPILLARY
Glucose-Capillary: 121 mg/dL — ABNORMAL HIGH (ref 70–99)
Glucose-Capillary: 154 mg/dL — ABNORMAL HIGH (ref 70–99)

## 2019-03-27 LAB — URINALYSIS, ROUTINE W REFLEX MICROSCOPIC
Bacteria, UA: NONE SEEN
Bilirubin Urine: NEGATIVE
Glucose, UA: NEGATIVE mg/dL
Ketones, ur: NEGATIVE mg/dL
Leukocytes,Ua: NEGATIVE
Nitrite: NEGATIVE
Protein, ur: NEGATIVE mg/dL
Specific Gravity, Urine: 1.018 (ref 1.005–1.030)
pH: 5 (ref 5.0–8.0)

## 2019-03-27 MED ORDER — TRAZODONE HCL 50 MG PO TABS
25.0000 mg | ORAL_TABLET | Freq: Every evening | ORAL | Status: DC | PRN
  Administered 2019-03-28 – 2019-04-06 (×5): 50 mg via ORAL
  Filled 2019-03-27 (×6): qty 1

## 2019-03-27 MED ORDER — CHLORHEXIDINE GLUCONATE CLOTH 2 % EX PADS
6.0000 | MEDICATED_PAD | Freq: Every day | CUTANEOUS | Status: DC
  Administered 2019-03-28 – 2019-03-29 (×2): 6 via TOPICAL

## 2019-03-27 MED ORDER — ASPIRIN EC 81 MG PO TBEC
81.0000 mg | DELAYED_RELEASE_TABLET | Freq: Every day | ORAL | Status: DC
  Administered 2019-03-28 – 2019-04-11 (×15): 81 mg via ORAL
  Filled 2019-03-27 (×15): qty 1

## 2019-03-27 MED ORDER — VITAMIN B-12 100 MCG PO TABS
100.0000 ug | ORAL_TABLET | Freq: Every day | ORAL | Status: DC
  Administered 2019-03-28 – 2019-04-11 (×15): 100 ug via ORAL
  Filled 2019-03-27 (×15): qty 1

## 2019-03-27 MED ORDER — TRAMADOL HCL 50 MG PO TABS
50.0000 mg | ORAL_TABLET | ORAL | Status: DC | PRN
  Administered 2019-03-28 (×2): 100 mg via ORAL
  Administered 2019-03-29: 50 mg via ORAL
  Administered 2019-03-29 – 2019-03-31 (×4): 100 mg via ORAL
  Filled 2019-03-27 (×4): qty 2
  Filled 2019-03-27: qty 1
  Filled 2019-03-27 (×2): qty 2
  Filled 2019-03-27: qty 1

## 2019-03-27 MED ORDER — IPRATROPIUM-ALBUTEROL 0.5-2.5 (3) MG/3ML IN SOLN: 3.0000 mL | Freq: Four times a day (QID) | RESPIRATORY_TRACT | Status: DC | PRN

## 2019-03-27 MED ORDER — PROCHLORPERAZINE EDISYLATE 10 MG/2ML IJ SOLN: 5.0000 mg | Freq: Four times a day (QID) | INTRAMUSCULAR | Status: DC | PRN

## 2019-03-27 MED ORDER — MOMETASONE FURO-FORMOTEROL FUM 200-5 MCG/ACT IN AERO
2.0000 | INHALATION_SPRAY | Freq: Two times a day (BID) | RESPIRATORY_TRACT | Status: DC
  Administered 2019-03-27 – 2019-04-11 (×29): 2 via RESPIRATORY_TRACT
  Filled 2019-03-27: qty 8.8

## 2019-03-27 MED ORDER — OXYCODONE HCL 5 MG PO TABS: 5.0000 mg | ORAL_TABLET | ORAL | Status: DC | PRN

## 2019-03-27 MED ORDER — ALLOPURINOL 300 MG PO TABS
300.0000 mg | ORAL_TABLET | Freq: Every morning | ORAL | Status: DC
  Filled 2019-03-27: qty 1

## 2019-03-27 MED ORDER — POTASSIUM CHLORIDE CRYS ER 20 MEQ PO TBCR
20.0000 meq | EXTENDED_RELEASE_TABLET | Freq: Every day | ORAL | Status: DC
  Administered 2019-03-28 – 2019-04-06 (×10): 20 meq via ORAL
  Filled 2019-03-27 (×10): qty 1

## 2019-03-27 MED ORDER — METOPROLOL TARTRATE 12.5 MG HALF TABLET
12.5000 mg | ORAL_TABLET | Freq: Every morning | ORAL | Status: DC
  Administered 2019-03-28 – 2019-04-11 (×15): 12.5 mg via ORAL
  Filled 2019-03-27 (×15): qty 1

## 2019-03-27 MED ORDER — ATORVASTATIN CALCIUM 80 MG PO TABS
80.0000 mg | ORAL_TABLET | Freq: Every day | ORAL | Status: DC
  Administered 2019-03-27 – 2019-04-10 (×15): 80 mg via ORAL
  Filled 2019-03-27 (×15): qty 1

## 2019-03-27 MED ORDER — PANTOPRAZOLE SODIUM 40 MG PO TBEC
40.0000 mg | DELAYED_RELEASE_TABLET | Freq: Every day | ORAL | Status: DC
  Administered 2019-03-28 – 2019-04-11 (×15): 40 mg via ORAL
  Filled 2019-03-27 (×15): qty 1

## 2019-03-27 MED ORDER — DOCUSATE SODIUM 100 MG PO CAPS
200.0000 mg | ORAL_CAPSULE | Freq: Every day | ORAL | Status: DC
  Administered 2019-03-28 – 2019-04-11 (×14): 200 mg via ORAL
  Filled 2019-03-27 (×15): qty 2

## 2019-03-27 MED ORDER — DIPHENHYDRAMINE HCL 12.5 MG/5ML PO ELIX: 12.5000 mg | ORAL_SOLUTION | Freq: Four times a day (QID) | ORAL | Status: DC | PRN

## 2019-03-27 MED ORDER — ZINC OXIDE 40 % EX OINT
TOPICAL_OINTMENT | Freq: Every day | CUTANEOUS | Status: DC
  Administered 2019-03-27 – 2019-04-11 (×15): via TOPICAL
  Filled 2019-03-27: qty 57

## 2019-03-27 MED ORDER — WITCH HAZEL-GLYCERIN EX PADS
MEDICATED_PAD | CUTANEOUS | Status: DC | PRN
  Administered 2019-03-29 – 2019-04-01 (×2): via TOPICAL

## 2019-03-27 MED ORDER — SODIUM CHLORIDE 0.9% FLUSH
10.0000 mL | Freq: Two times a day (BID) | INTRAVENOUS | Status: DC
  Administered 2019-03-28 – 2019-03-29 (×4): 10 mL

## 2019-03-27 MED ORDER — LIDOCAINE HCL URETHRAL/MUCOSAL 2 % EX GEL: CUTANEOUS | Status: DC | PRN

## 2019-03-27 MED ORDER — FLUTICASONE PROPIONATE 50 MCG/ACT NA SUSP
1.0000 | Freq: Every day | NASAL | Status: DC
  Administered 2019-03-28 – 2019-04-11 (×15): 1 via NASAL
  Filled 2019-03-27: qty 16

## 2019-03-27 MED ORDER — POLYETHYLENE GLYCOL 3350 17 G PO PACK
17.0000 g | PACK | Freq: Every day | ORAL | Status: DC | PRN
  Administered 2019-04-05: 17 g via ORAL
  Filled 2019-03-27: qty 1

## 2019-03-27 MED ORDER — TAMSULOSIN HCL 0.4 MG PO CAPS
0.4000 mg | ORAL_CAPSULE | Freq: Every day | ORAL | Status: DC
  Administered 2019-03-27 – 2019-03-28 (×2): 0.4 mg via ORAL
  Filled 2019-03-27 (×2): qty 1

## 2019-03-27 MED ORDER — APIXABAN 5 MG PO TABS
5.0000 mg | ORAL_TABLET | Freq: Two times a day (BID) | ORAL | Status: DC
  Administered 2019-03-27 – 2019-04-11 (×30): 5 mg via ORAL
  Filled 2019-03-27 (×30): qty 1

## 2019-03-27 MED ORDER — BISACODYL 10 MG RE SUPP: 10.0000 mg | Freq: Every day | RECTAL | Status: DC | PRN

## 2019-03-27 MED ORDER — ACETAMINOPHEN 325 MG PO TABS
325.0000 mg | ORAL_TABLET | ORAL | Status: DC | PRN
  Administered 2019-04-05 – 2019-04-11 (×10): 650 mg via ORAL
  Filled 2019-03-27 (×12): qty 2

## 2019-03-27 MED ORDER — GUAIFENESIN-DM 100-10 MG/5ML PO SYRP: 5.0000 mL | ORAL_SOLUTION | Freq: Four times a day (QID) | ORAL | Status: DC | PRN

## 2019-03-27 MED ORDER — BISACODYL 10 MG RE SUPP
10.0000 mg | Freq: Every day | RECTAL | Status: DC
  Filled 2019-03-27 (×2): qty 1

## 2019-03-27 MED ORDER — CITALOPRAM HYDROBROMIDE 10 MG PO TABS
10.0000 mg | ORAL_TABLET | Freq: Every day | ORAL | Status: DC
  Administered 2019-03-28 – 2019-04-11 (×15): 10 mg via ORAL
  Filled 2019-03-27 (×15): qty 1

## 2019-03-27 MED ORDER — ALUM & MAG HYDROXIDE-SIMETH 200-200-20 MG/5ML PO SUSP: 30.0000 mL | ORAL | Status: DC | PRN

## 2019-03-27 MED ORDER — MUPIROCIN CALCIUM 2 % EX CREA
TOPICAL_CREAM | Freq: Two times a day (BID) | CUTANEOUS | Status: DC
  Administered 2019-03-28 – 2019-04-04 (×15): via TOPICAL
  Administered 2019-04-05: 1 via TOPICAL
  Administered 2019-04-05 (×2): via TOPICAL
  Administered 2019-04-06: 1 via TOPICAL
  Administered 2019-04-06 – 2019-04-11 (×10): via TOPICAL
  Filled 2019-03-27: qty 15

## 2019-03-27 MED ORDER — PROCHLORPERAZINE 25 MG RE SUPP: 12.5000 mg | Freq: Four times a day (QID) | RECTAL | Status: DC | PRN

## 2019-03-27 MED ORDER — FUROSEMIDE 40 MG PO TABS
40.0000 mg | ORAL_TABLET | Freq: Every day | ORAL | Status: DC
  Administered 2019-03-28: 40 mg via ORAL
  Filled 2019-03-27: qty 1

## 2019-03-27 MED ORDER — AMIODARONE HCL 200 MG PO TABS
200.0000 mg | ORAL_TABLET | Freq: Every day | ORAL | Status: DC
  Administered 2019-03-28 – 2019-04-11 (×15): 200 mg via ORAL
  Filled 2019-03-27 (×15): qty 1

## 2019-03-27 MED ORDER — BARRIER CREAM NON-SPECIFIED: 1.0000 "application " | TOPICAL_CREAM | Freq: Every day | TOPICAL | Status: DC

## 2019-03-27 MED ORDER — BISACODYL 5 MG PO TBEC
10.0000 mg | DELAYED_RELEASE_TABLET | Freq: Every day | ORAL | Status: DC
  Administered 2019-03-28 – 2019-04-11 (×13): 10 mg via ORAL
  Filled 2019-03-27 (×14): qty 2

## 2019-03-27 MED ORDER — FLEET ENEMA 7-19 GM/118ML RE ENEM: 1.0000 | ENEMA | Freq: Once | RECTAL | Status: DC | PRN

## 2019-03-27 MED ORDER — FOLIC ACID 1 MG PO TABS
1.0000 mg | ORAL_TABLET | Freq: Every day | ORAL | Status: DC
  Administered 2019-03-28 – 2019-04-11 (×15): 1 mg via ORAL
  Filled 2019-03-27 (×15): qty 1

## 2019-03-27 MED ORDER — SODIUM CHLORIDE 0.9% FLUSH
10.0000 mL | INTRAVENOUS | Status: DC | PRN
  Administered 2019-03-30: 10 mL
  Filled 2019-03-27: qty 40

## 2019-03-27 MED ORDER — PROCHLORPERAZINE MALEATE 5 MG PO TABS: 5.0000 mg | ORAL_TABLET | Freq: Four times a day (QID) | ORAL | Status: DC | PRN

## 2019-03-27 MED ORDER — VITAMIN B-1 100 MG PO TABS
100.0000 mg | ORAL_TABLET | Freq: Every day | ORAL | Status: DC
  Administered 2019-03-28 – 2019-04-11 (×15): 100 mg via ORAL
  Filled 2019-03-27 (×15): qty 1

## 2019-03-27 MED ORDER — GUAIFENESIN ER 600 MG PO TB12
600.0000 mg | ORAL_TABLET | Freq: Two times a day (BID) | ORAL | Status: DC
  Administered 2019-03-27 – 2019-04-11 (×30): 600 mg via ORAL
  Filled 2019-03-27 (×30): qty 1

## 2019-03-27 NOTE — Plan of Care (Signed)
  Problem: Education: Goal: Knowledge of General Education information will improve Description: Including pain rating scale, medication(s)/side effects and non-pharmacologic comfort measures Outcome: Progressing   Problem: Health Behavior/Discharge Planning: Goal: Ability to manage health-related needs will improve Outcome: Progressing   Problem: Clinical Measurements: Goal: Ability to maintain clinical measurements within normal limits will improve Outcome: Progressing Goal: Will remain free from infection Outcome: Progressing Goal: Diagnostic test results will improve Outcome: Progressing Goal: Respiratory complications will improve Outcome: Progressing Goal: Cardiovascular complication will be avoided Outcome: Progressing   Problem: Activity: Goal: Risk for activity intolerance will decrease Outcome: Progressing   Problem: Nutrition: Goal: Adequate nutrition will be maintained Outcome: Progressing   Problem: Coping: Goal: Level of anxiety will decrease Outcome: Progressing   Problem: Elimination: Goal: Will not experience complications related to bowel motility Outcome: Progressing Goal: Will not experience complications related to urinary retention Outcome: Progressing   Problem: Pain Managment: Goal: General experience of comfort will improve Outcome: Progressing   Problem: Safety: Goal: Ability to remain free from injury will improve Outcome: Progressing   Problem: Skin Integrity: Goal: Risk for impaired skin integrity will decrease Outcome: Progressing   Problem: Education: Goal: Will demonstrate proper wound care and an understanding of methods to prevent future damage Outcome: Progressing Goal: Knowledge of disease or condition will improve Outcome: Progressing Goal: Knowledge of the prescribed therapeutic regimen will improve Outcome: Progressing Goal: Individualized Educational Video(s) Outcome: Progressing   Problem: Activity: Goal: Risk for  activity intolerance will decrease Outcome: Progressing   Problem: Cardiac: Goal: Will achieve and/or maintain hemodynamic stability Outcome: Progressing   Problem: Clinical Measurements: Goal: Postoperative complications will be avoided or minimized Outcome: Progressing   Problem: Respiratory: Goal: Respiratory status will improve Outcome: Progressing   Problem: Skin Integrity: Goal: Wound healing without signs and symptoms of infection Outcome: Progressing Goal: Risk for impaired skin integrity will decrease Outcome: Progressing   Problem: Urinary Elimination: Goal: Ability to achieve and maintain adequate renal perfusion and functioning will improve Outcome: Progressing   Problem: Nutrition: Goal: Dietary intake will improve Outcome: Progressing   Problem: Ischemic Stroke/TIA Tissue Perfusion: Goal: Complications of ischemic stroke/TIA will be minimized Outcome: Progressing

## 2019-03-27 NOTE — Progress Notes (Signed)
Physical Therapy Treatment Patient Details Name: Martin [name] MRN: [id number] DOB: 11-07-1955 Today's Date: 03/27/2019    History of Present Illness Pt is a 63 y/o male with PMH of  CAD, paroxysmal atrial fibrillation, OA, low back pain, polysubstance abuse, presents with chest pain and possible NSTEMI. S/p L heart cath and coronary angiography 9/30, CABG x 3 03/10/19.  Course complicated by CT revealing R distal ACA infarct 03/13/19. Pt with periods of lethargy; EEG WNL.    PT Comments    Pt demonstrates reduced tolerance for ambulation with use of mask in hallway. Pt initially on 2L [place] during ambulation, increased to 3L [place] due to reports of SOB during activity. Pt continues to demonstrate reduced LE power, necessitating PT physical assistance for bed mobility and transfers. Pt with reduced foot clearance of LLE, increasing falls risk. Pt will benefit from continued PT POC to reduce falls risk and restore pt's prior level of function.   Follow Up Recommendations  CIR;Supervision/Assistance - 24 hour     Equipment Recommendations  (TBD)    Recommendations for Other Services       Precautions / Restrictions Precautions Precautions: Sternal;Fall Precaution Booklet Issued: No Precaution Comments: Lft hemiparesis Restrictions Other Position/Activity Restrictions: sternal precautions     Mobility  Bed Mobility Overal bed mobility: Needs Assistance Bed Mobility: Rolling;Sidelying to Sit Rolling: Mod assist Sidelying to sit: Mod assist          Transfers Overall transfer level: Needs assistance Equipment used: Rolling walker (2 wheeled) Transfers: Sit to/from Stand Sit to Stand: Min assist;Mod assist(modA from bed, minA fro recliner)         General transfer comment: used momentum, assist for L foot placement, cues for using legs not arms with hads on walker  Ambulation/Gait Ambulation/Gait assistance: Min assist;+2 safety/equipment Gait Distance (Feet): 30  Feet(trials of 30', 20'. 10', 15' x 2) Assistive device: Rolling walker (2 wheeled) Gait Pattern/deviations: Step-to pattern;Decreased step length - right;Decreased step length - left;Shuffle;Wide base of support Gait velocity: decreased Gait velocity interpretation: <1.31 ft/sec, indicative of household ambulator General Gait Details: short step to gait with shuffling pattern, pt with increased forward momentum at times when shuffling starts, PT provides verbal cues to slow gait speed to reduce falls risk. Pt with temporarily improved foot clearance with verbal cues for increased L knee/hip flexion   Stairs             Wheelchair Mobility    Modified Rankin (Stroke Patients Only) Modified Rankin (Stroke Patients Only) Pre-Morbid Rankin Score: No significant disability Modified Rankin: Moderately severe disability     Balance Overall balance assessment: Needs assistance;History of Falls Sitting-balance support: Single extremity supported;Feet supported Sitting balance-Leahy Scale: Fair Sitting balance - Comments: left lateral lean, minA initially progressing to supervision   Standing balance support: Bilateral upper extremity supported Standing balance-Leahy Scale: Poor Standing balance comment: minA-minG for standing balance                            Cognition Arousal/Alertness: Awake/alert Behavior During Therapy: WFL for tasks assessed/performed Overall Cognitive Status: Within Functional Limits for tasks assessed                                        Exercises      General Comments General comments (skin integrity, edema, etc.): max HR of 124,  recovers quickly to low-100s and 90s with standing or seated rest break      Pertinent Vitals/Pain Pain Assessment: Faces Faces Pain Scale: Hurts little more Pain Location: R ankle Pain Descriptors / Indicators: Aching Pain Intervention(s): Limited activity within patient's tolerance     Home Living                      Prior Function            PT Goals (current goals can now be found in the care plan section) Acute Rehab PT Goals Patient Stated Goal: "Get home to my grandkids" Progress towards PT goals: Progressing toward goals    Frequency    Min 4X/week      PT Plan Current plan remains appropriate    Co-evaluation              AM-PAC PT "6 Clicks" Mobility   Outcome Measure  Help needed turning from your back to your side while in a flat bed without using bedrails?: A Lot Help needed moving from lying on your back to sitting on the side of a flat bed without using bedrails?: A Lot Help needed moving to and from a bed to a chair (including a wheelchair)?: A Little Help needed standing up from a chair using your arms (e.g., wheelchair or bedside chair)?: A Little Help needed to walk in hospital room?: A Little Help needed climbing 3-5 steps with a railing? : Total 6 Click Score: 14    End of Session Equipment Utilized During Treatment: Gait belt;Oxygen Activity Tolerance: Patient limited by fatigue Patient left: in chair;with call bell/phone within reach;with chair alarm set Nurse Communication: Mobility status PT Visit Diagnosis: Muscle weakness (generalized) (M62.81);Hemiplegia and hemiparesis;Other abnormalities of gait and mobility (R26.89) Hemiplegia - Right/Left: Left Hemiplegia - dominant/non-dominant: Non-dominant Hemiplegia - caused by: Cerebral infarction     Time: [id number] PT Time Calculation (min) (ACUTE ONLY): 48 min  Charges:  $Gait Training: 23-37 mins $Therapeutic Activity: 8-22 mins                     Martin [name] Niece, PT, DPT Acute Rehabilitation Pager: [phone]    Martin [name] Niece 03/27/2019, 11:46 AM

## 2019-03-27 NOTE — Care Management Important Message (Signed)
Important Message  Patient Details  Name: Martin [name] MRN: [id number] Date of Birth: 02-18-1956   Medicare Important Message Given:  Yes     Memory Argue 03/27/2019, 4:38 PM

## 2019-03-27 NOTE — Progress Notes (Signed)
Orthopedic Tech Progress Note Patient Details:  Martin [name] Oct 30, 1955 [id number]  Ortho Devices Type of Ortho Device: Prafo boot/shoe Ortho Device/Splint Location: LLE Ortho Device/Splint Interventions: Ordered, Application   Post Interventions Patient Tolerated: Well   Braulio Bosch 03/27/2019, 2:23 PM

## 2019-03-27 NOTE — H&P (Addendum)
Physical Medicine and Rehabilitation Admission H&P        Chief Complaint  Patient presents with  . debility      HPI: Martin [name] is a 63 year old male with history of CAD/PAF-on Eliquis, OSA,  who was admitted on 03/07/19 with CP and had mild elevation in troponins with subtle EKG changes concerning for NSTEMI.  2D echo showed mild increase in LVH with basal inferior akinesis and EF 55-60%. He underwent cardiac cath revealing severe 3V CAD and underwent CABG X 3 by Dr. Prescott Gum on 03/11/19. He tolerated extubation with out difficulty but had confusion and was noted to have left sided weakness on 10/5 am.  CT head done revealing small acute cortical distal R-ACA territory infarct and progressive small vessel disease. CTA head/neck was negative for LVO, revealed new focus of calcification in R-ACA distal A2 segment which could indicated partially calcified thrombus, fetal origins PCA as well as incidental findings of large loculated component of left effusion and retrosternal 4.3X 2.0X 5.2 cm fluid collection favored to be seroma. He developed A fib with RVR as well as VT and was started on amiodarone on 10/5. Cardizem added for rate control and due to low BP maintained on milrinone due to fluid overload. Dr. Erlinda Hong recommends BP goal 110-140 to maintain adequate cerebral perfusion.    Follow up CT head without bleed therefore heparin added on 10/06 and transitioned to Eliquis by 10/08.  EEG done due to somnolence and was negative for seizures. He has had issues with urinary retention requiring I/O caths and foley replaced. He did develop progressive leucocytosis with WBC- 20.1 on 10/13 therefore started on doxycycline and Cipro added 10/15.  UCS negative and CXR done revealing stable bilateral effusion snd stable nodular density left mid lung possibly hematoma at side of prior chest tube. Leucocytosis resolving but 10/15 pm, he did have unresponsive episode with seizure type episode and 14.5  seconds asystole felt to be vasovagal in nature. Amiodarone and BB d/c briefly due to bradycardia and ongoing A fib--as HR stable amiodarone resumed 10/18 and low dose BB resumed today.    He continue to require oxygen per [place] due to and is noted to be limited by left hemiparesis with shuffling gait and SOB with activity. CIR recommended due to functional decline.      Review of Systems  Constitutional: Negative for chills and fever.  HENT: Negative for hearing loss and tinnitus.   Eyes: Positive for blurred vision (needs eyes examined). Negative for double vision.  Respiratory: Positive for shortness of breath.   Cardiovascular: Negative for chest pain and palpitations.  Gastrointestinal: Negative for abdominal pain, heartburn and nausea.  Musculoskeletal: Negative for joint pain and myalgias.  Skin: Negative for rash.  Neurological: Positive for focal weakness. Negative for dizziness and headaches.  Psychiatric/Behavioral: The patient is not nervous/anxious and does not have insomnia.             Past Medical History:  Diagnosis Date  . Atrial fibrillation ([place])      a. dx 10/2017. b. s/p DCCV on 11/19/2017 with successful conversion to NSR but back in AFIB 3 weeks later  . CAD (coronary artery disease)      a. s/p PCI to LAD 2001. b. s/p stenting to the RCA and mid LAD December 2011, residual distal RCA disease treated medically. b. nuc 02/2013 abnormal with fixed defect seen in inferoapical, mid-inferior, and basal inferior walls, indicative of myocardial  scar, no evidence of ischemia, EF 41% (Ef 55-60% by echo same time).  . Carotid artery disease ([place])      carotid Doppler course left side 5069% stenosis  . Dysrhythmia      AFib  . Habitual alcohol use    . Hyperlipidemia    . Hypertension    . Low back pain    . OSA on CPAP    . Osteoarthritis    . Polysubstance abuse ([place])      use including marijuana and vicodin,which he obtained from the street  . PVC's (premature  ventricular contractions)      status post Holter monitor normal sinus rhythm otherwise asymptomatic PVCs.  . Seasonal allergies           Past Surgical History:  Procedure Laterality Date  . CARDIOVERSION N/A 11/19/2017    Procedure: CARDIOVERSION;  Surgeon: Herminio Commons, MD;  Location: [facility];  Service: Cardiovascular;  Laterality: N/A;  . CORONARY ANGIOPLASTY WITH STENT PLACEMENT   2011  . CORONARY ARTERY BYPASS GRAFT N/A 03/10/2019    Procedure: CORONARY ARTERY BYPASS GRAFTING (CABG)X 3 Using Left Internal Mammary Artery and Right Saphenous Vein Grafts;  Surgeon: Ivin Poot, MD;  Location: [facility];  Service: Open Heart Surgery;  Laterality: N/A;  . LEFT HEART CATH AND CORONARY ANGIOGRAPHY N/A 03/08/2019    Procedure: LEFT HEART CATH AND CORONARY ANGIOGRAPHY;  Surgeon: Belva Crome, MD;  Location: [facility];  Service: Cardiovascular;  Laterality: N/A;  . TEE WITHOUT CARDIOVERSION N/A 03/10/2019    Procedure: TRANSESOPHAGEAL ECHOCARDIOGRAM (TEE);  Surgeon: Prescott Gum, Collier Salina, MD;  Location: [facility];  Service: Open Heart Surgery;  Laterality: N/A;  . TONSILLECTOMY               Family History  Problem Relation Age of Onset  . Stroke Mother    . Other Mother          small bowel obstruction  . Heart attack Father    . Heart attack Brother        Social History:  Married. Works as a Editor, commissioning. He reports that he quit smoking about 19 years ago. His smoking use included cigarettes. He started smoking about 51 years ago. He has a 35.00 pack-year smoking history. He has never used smokeless tobacco. He reports current alcohol use of about 42.0 standard drinks of alcohol per week. He reports current drug use. Drug: Marijuana.           Allergies  Allergen Reactions  . Penicillins Hives and Rash      Has patient had a PCN reaction causing immediate rash, facial/tongue/throat swelling, SOB or lightheadedness with hypotension: Unknown Has patient had a PCN  reaction causing severe rash involving mucus membranes or skin necrosis: Unknown Has patient had a PCN reaction that required hospitalization: No Has patient had a PCN reaction occurring within the last 10 years: No If all of the above answers are "NO", then may proceed with Cephalosporin use.              Medications Prior to Admission  Medication Sig Dispense Refill  . allopurinol (ZYLOPRIM) 300 MG tablet Take 300 mg by mouth every morning.       Marland Kitchen apixaban (ELIQUIS) 5 MG TABS tablet Take 1 tablet (5 mg total) by mouth 2 (two) times daily. 180 tablet 1  . atorvastatin (LIPITOR) 80 MG tablet Take 1 tablet (80 mg total) by mouth daily. (  Patient taking differently: Take 80 mg by mouth every evening. ) 90 tablet 2  . Cholecalciferol (VITAMIN D-3) 1000 UNITS CAPS Take 1,000 Units by mouth every morning.       . Cyanocobalamin (VITAMIN B-12) 2500 MCG SUBL Place 2,500 mcg under the tongue every morning.       . diclofenac (VOLTAREN) 75 MG EC tablet Take 75 mg by mouth 2 (two) times daily.      Marland Kitchen diltiazem (CARDIZEM CD) 120 MG 24 hr capsule TAKE 1 CAPSULE BY MOUTH EVERY DAY (Patient taking differently: Take 120 mg by mouth every morning. ) 90 capsule 2  . fluticasone (FLONASE) 50 MCG/ACT nasal spray Place 2 sprays into the nose daily.        . folic acid (FOLVITE) 1 MG tablet TAKE ONE TABLET BY MOUTH EVERY DAY (Patient taking differently: Take 1 mg by mouth every morning. ) 30 tablet 6  . lisinopril (PRINIVIL,ZESTRIL) 40 MG tablet TAKE 1 TABLET BY MOUTH EVERY DAY (Patient taking differently: Take 40 mg by mouth every morning. ) 90 tablet 1  . loratadine (CLARITIN) 10 MG tablet Take 10 mg by mouth every morning.       . magnesium oxide (MAG-OX) 400 MG tablet Take 400 mg by mouth at bedtime.       . meclizine (ANTIVERT) 32 MG tablet Take 32 mg by mouth daily as needed for dizziness.       . nitroGLYCERIN (NITROSTAT) 0.4 MG SL tablet PLACE 1 TABLET (0.4 MG TOTAL) UNDER THE TONGUE EVERY 5 (FIVE) MINUTES  AS NEEDED. (Patient taking differently: Place 0.4 mg under the tongue every 5 (five) minutes as needed for chest pain. ) 25 tablet 3  . Omega-3 Fatty Acids (FISH OIL) 1200 MG CAPS Take 1,200 mg by mouth 2 (two) times daily.       . polyethylene glycol (MIRALAX / GLYCOLAX) packet Take 17 g by mouth 3 (three) times a week.       . triamterene-hydrochlorothiazide (MAXZIDE-25) 37.5-25 MG per tablet Take 1 tablet by mouth every morning.           Drug Regimen Review  Drug regimen was reviewed and remains appropriate with no significant issues identified   Home: Home Living Family/patient expects to be discharged to:: Inpatient rehab Living Arrangements: Spouse/significant other Available Help at Discharge: Family, Available 24 hours/day Type of Home: House Home Access: Other (comment)(threshold) Home Layout: One level, Other (Comment)(den with 2 steps up, on L side ) Bathroom Shower/Tub: Multimedia programmer: Standard Home Equipment: Grab bars - tub/shower, Radio producer - single point  Lives With: Spouse   Functional History: Prior Function Level of Independence: Independent Comments: working full time, driving    Functional Status:  Mobility: Bed Mobility Overal bed mobility: Needs Assistance Bed Mobility: Rolling, Sidelying to Sit, Sit to Sidelying Rolling: Max assist Sidelying to sit: Mod assist, HOB elevated Supine to sit: Max assist, HOB elevated Sit to sidelying: Mod assist General bed mobility comments: cues for technique and to hug pillow to chest, assist for rolling to R flexed L knee for him and turned hips, he got feet off bed and had help to lift trunk,  sit to side assist for legs onto bed Transfers Overall transfer level: Needs assistance Equipment used: Rolling walker (2 wheeled) Transfer via Lift Equipment: Stedy Transfers: Sit to/from Stand Sit to Stand: Min assist, Mod assist General transfer comment: used momentum, assist for L foot placement, cues for using  legs not arms with  hads on walker; mostly min A once increased lifting help Ambulation/Gait Ambulation/Gait assistance: +2 safety/equipment, Mod assist, Min assist Gait Distance (Feet): 90 Feet(x 2) Assistive device: Rolling walker (2 wheeled) Gait Pattern/deviations: Step-to pattern, Step-through pattern, Decreased step length - left, Shuffle, Decreased dorsiflexion - left, Wide base of support General Gait Details: cues for breathing, L foot step length and foot clearance and to slow down due to quickly shuffling feet; stopped twice to stand and breathe and twice to sit in chair and rest in hallway Gait velocity: decreased Gait velocity interpretation: <1.8 ft/sec, indicate of risk for recurrent falls   ADL: ADL Overall ADL's : Needs assistance/impaired Grooming: Minimal assistance, Oral care(+2 standing ) Grooming Details (indicate cue type and reason): washing face seated with setup assist, min assist to manage oral care in standing (cueing to use L hand as stabilizer) and min assist +2 fading to mod assist for standing balance when fatiguing  Upper Body Bathing: Maximal assistance, Sitting Lower Body Bathing: Maximal assistance, +2 for physical assistance, Bed level Upper Body Dressing : Total assistance, Sitting Lower Body Dressing: Total assistance, +2 for physical assistance, Bed level Toilet Transfer: Minimal assistance, Moderate assistance, +2 for safety/equipment, +2 for physical assistance, Stand-pivot, RW Toilet Transfer Details (indicate cue type and reason): simulated in room to recliner  Functional mobility during ADLs: Moderate assistance, +2 for physical assistance, Rolling walker, Cueing for safety, Cueing for sequencing General ADL Comments: pt remains limited by cognition, L sided weakness, sternal precautions, and cognition    Cognition: Cognition Overall Cognitive Status: Within Functional Limits for tasks assessed Arousal/Alertness: Awake/alert Orientation Level:  Oriented X4 Attention: Selective Selective Attention: Impaired Selective Attention Impairment: Verbal basic Memory: Impaired Memory Impairment: Retrieval deficit, Decreased recall of new information, Storage deficit Awareness: Impaired Awareness Impairment: Anticipatory impairment Problem Solving: Impaired Problem Solving Impairment: Functional basic Safety/Judgment: Impaired Cognition Arousal/Alertness: Awake/alert Behavior During Therapy: WFL for tasks assessed/performed Overall Cognitive Status: Within Functional Limits for tasks assessed Area of Impairment: Memory, Attention, Safety/judgement, Awareness, Problem solving, Following commands Orientation Level: Disoriented to, Place, Time(reports 2021, [place] [facility] (able to correct hops[facility])) Current Attention Level: Sustained Memory: Decreased short-term memory, Decreased recall of precautions Following Commands: Follows one step commands consistently, Follows multi-step commands inconsistently, Follows one step commands with increased time Safety/Judgement: Decreased awareness of safety, Decreased awareness of deficits Awareness: Emergent Problem Solving: Slow processing, Difficulty sequencing, Requires verbal cues, Requires tactile cues General Comments: able to transfer without vc for hand placement     Blood pressure 122/81, pulse 86, temperature 97.7 F (36.5 C), temperature source Oral, resp. rate (!) 24, height 5\' 8"  (1.727 m), weight 123.2 kg, SpO2 95 %. Physical Exam  Nursing note and vitals reviewed. Constitutional: He is oriented to person, place, and time. He appears well-developed and well-nourished. No distress. Nasal cannula in place.  Up in chair. 3 L oxygen per [place].   HENT:  Head: Normocephalic and atraumatic.  Nose: Nose normal.  Mouth/Throat: Oropharynx is clear and moist.  O2 via [place]  Eyes: Pupils are equal, round, and reactive to light. EOM are normal.  Neck: Normal range of motion. No tracheal  deviation present. No thyromegaly present.  Cardiovascular: Normal rate, regular rhythm and intact distal pulses. Exam reveals no friction rub.  No murmur heard. Respiratory: No respiratory distress.  Chest wall incision C/D/I  GI: He exhibits no distension. There is no abdominal tenderness. There is no rebound.  Musculoskeletal:        General: Edema present.  Comments: 1+ edema BLE   Neurological: He is alert and oriented to person, place, and time. He displays normal reflexes. No cranial nerve deficit. Coordination normal.  Speech clear. Minimal left central 7. LUE 4/5. LLE: 3/5 HF, 3+KE and 0-tr ADF/APF. RUE and RLE 4+/5. Normal sensation. Good insight and awareness.     Skin: He is not diaphoretic.  Skin cool LLE. Multiple scars and abrasions bilateral legs. LLE donor sites clean dry and intact. Raw area along gluteal folds/sacrum d/t moisture Psychiatric: He has a normal mood and affect. His behavior is normal. Judgment and thought content normal.      Lab Results Last 48 Hours       Results for orders placed or performed during the hospital encounter of 03/07/19 (from the past 48 hour(s))  Glucose, capillary     Status: Abnormal    Collection Time: 03/25/19 11:08 AM  Result Value Ref Range    Glucose-Capillary 196 (H) 70 - 99 mg/dL    Comment 1 Notify RN      Comment 2 Document in Chart    Glucose, capillary     Status: Abnormal    Collection Time: 03/25/19  3:36 PM  Result Value Ref Range    Glucose-Capillary 184 (H) 70 - 99 mg/dL    Comment 1 Notify RN      Comment 2 Document in Chart    Glucose, capillary     Status: Abnormal    Collection Time: 03/25/19  9:06 PM  Result Value Ref Range    Glucose-Capillary 107 (H) 70 - 99 mg/dL    Comment 1 Notify RN      Comment 2 Document in Chart    Glucose, capillary     Status: None    Collection Time: 03/26/19  6:25 AM  Result Value Ref Range    Glucose-Capillary 83 70 - 99 mg/dL  Glucose, capillary     Status: Abnormal     Collection Time: 03/26/19 11:12 AM  Result Value Ref Range    Glucose-Capillary 146 (H) 70 - 99 mg/dL    Comment 1 Notify RN      Comment 2 Document in Chart    Glucose, capillary     Status: Abnormal    Collection Time: 03/26/19  4:00 PM  Result Value Ref Range    Glucose-Capillary 146 (H) 70 - 99 mg/dL    Comment 1 Notify RN      Comment 2 Document in Chart    Glucose, capillary     Status: Abnormal    Collection Time: 03/26/19  9:22 PM  Result Value Ref Range    Glucose-Capillary 184 (H) 70 - 99 mg/dL  Glucose, capillary     Status: Abnormal    Collection Time: 03/27/19  6:28 AM  Result Value Ref Range    Glucose-Capillary 121 (H) 70 - 99 mg/dL     Imaging Results (Last 48 hours)  No results found.           Medical Problem List and Plan: 1.  Left hemiparesis and decreased mobility secondary to right ACA infarct after CABG             -admit to inpatient rehab             -order PRAFO LLE 2.  Antithrombotics: -DVT/anticoagulation:  Pharmaceutical: Other (comment)--Eliquis             -antiplatelet therapy: On low dose ASA.  3. Pain Management: Oxycodone prn 4. Mood:  LCSW to follow for evaluation and support.              -antipsychotic agents: N/A 5. Neuropsych: This patient is capable of making decisions on his own behalf. 6. Skin/Wound Care: Monitor wound for healing. Continue protein supplement to help promote healing.              -PRAFO LLE             -barrier cream for buttock 7. Fluids/Electrolytes/Nutrition: strict I/O. Check lytes in am.  8. CAD s/p CABG: Continue sternal precautions. ON ASA and Lipitor.   9. HTN: BP goal 110-140 to allow for adequate cerebral perfusion.  10 Leukocytosis: Resolving --has completed 3 day course of Cipro and doxycyline on 10/17.  11. Diastolic heart failure: Strict I/O and daily weights. On Lasix daily with low dose BB.  12. Abnormal LFTs: Continue to monitor for now--recheck in am.  13. Malnutrition: Pre-albumin- 14.1  14. A fib: Monitor for recurrent bradycardia--check HR tid--on Eliquis bid and back on amiodarone and metoprolol daily 15. Persistent hyponatremia: Question SIADH v/s due to SSRI. Continue strict I/O. Check lytes in am, urine sodium and serum osmolality.  16. OSA/Hypoxia: PFTs with moderate to severe obstructive airway disease--now on Dulera bid.  CPAP with oxygen bleed in for now. Will attempt to wean oxygen as able.  17. Urinary retention: DC foley and start bladder training.          Bary Leriche, PA-C 03/27/2019   I have personally performed a face to face diagnostic evaluation of this patient and formulated the key components of the plan.  Additionally, I have personally reviewed laboratory data, imaging studies, as well as relevant notes and concur with the physician assistant's documentation above.  The patient's status has not changed from the original H&P.  Any changes in documentation from the acute care chart have been noted above.  Meredith Staggers, MD, [facility]

## 2019-03-27 NOTE — Progress Notes (Addendum)
Martin [name]       [employer] [id number]             [phone]      17 Days Post-Op Procedure(s) (LRB): CORONARY ARTERY BYPASS GRAFTING (CABG)X 3 Using Left Internal Mammary Artery and Right Saphenous Vein Grafts (N/A) TRANSESOPHAGEAL ECHOCARDIOGRAM (TEE) (N/A) Subjective: Feels pretty well, no new c/o  Objective: Vital signs in last 24 hours: Temp:  [97.7 F (36.5 C)-98.7 F (37.1 C)] 97.7 F (36.5 C) (10/19 0730) Pulse Rate:  [66-89] 86 (10/19 0730) Cardiac Rhythm: Atrial fibrillation (10/19 0705) Resp:  [15-23] 20 (10/19 0730) BP: (125-150)/(76-84) 127/83 (10/19 0730) SpO2:  [95 %-98 %] 95 % (10/19 0730) Weight:  [123.2 kg] 123.2 kg (10/19 0500)  Hemodynamic parameters for last 24 hours:    Intake/Output from previous day: 10/18 0701 - 10/19 0700 In: 920 [P.O.:900; I.V.:20] Out: 1500 [Urine:1500] Intake/Output this shift: Total I/O In: 240 [P.O.:240] Out: -   General appearance: alert, cooperative and no distress Neurologic: left sided weakness and no new changes Heart: regular rate and rhythm Lungs: dim in bases Abdomen: benign Extremities: min edema Wound: incis without signs of infection  Lab Results: No results for input(s): WBC, HGB, HCT, PLT in the last 72 hours. BMET: No results for input(s): NA, K, CL, CO2, GLUCOSE, BUN, CREATININE, CALCIUM in the last 72 hours.  PT/INR: No results for input(s): LABPROT, INR in the last 72 hours. ABG    Component Value Date/Time   PHART 7.478 (H) 03/16/2019 1239   HCO3 24.2 03/16/2019 1239   TCO2 25 03/16/2019 1239   ACIDBASEDEF 3.0 (H) 03/10/2019 1833   O2SAT 62.5 03/18/2019 0430   CBG (last 3)  Recent Labs    03/26/19 1600 03/26/19 2122 03/27/19 0628  GLUCAP 146* 184* 121*    Meds Scheduled Meds: . allopurinol  300 mg Oral q morning - 10a  . amiodarone  200 mg Oral Daily  . apixaban  5 mg Oral BID  . aspirin EC  81 mg Oral Daily  . atorvastatin  80 mg Oral q1800  .  bisacodyl  10 mg Rectal Daily   Or  . bisacodyl  10 mg Oral Daily  . Chlorhexidine Gluconate Cloth  6 each Topical Daily  . citalopram  10 mg Oral Daily  . docusate sodium  200 mg Oral Daily  . fluticasone  1 spray Each Nare Daily  . folic acid  1 mg Oral Daily  . furosemide  40 mg Oral Daily  . guaiFENesin  600 mg Oral BID  . metoprolol tartrate  12.5 mg Oral q morning - 10a  . mometasone-formoterol  2 puff Inhalation BID  . mupirocin cream   Topical BID  . pantoprazole  40 mg Oral Daily  . potassium chloride  20 mEq Oral Daily  . sodium chloride flush  10-40 mL Intracatheter Q12H  . sodium chloride flush  3 mL Intravenous Q12H  . tamsulosin  0.4 mg Oral QPC supper  . thiamine  100 mg Oral Daily  . traZODone  50 mg Oral QHS  . vitamin B-12  100 mcg Oral Daily   Continuous Infusions: . sodium chloride     PRN Meds:.ipratropium-albuterol, magnesium citrate, oxyCODONE, polyethylene glycol, sodium chloride flush, sodium chloride flush, traMADol, witch hazel-glycerin  Xrays No results found.  Assessment/Plan: S/P Procedure(s) (LRB): CORONARY ARTERY BYPASS GRAFTING (CABG)X 3 Using Left Internal Mammary Artery and Right Saphenous Vein Grafts (N/A) TRANSESOPHAGEAL ECHOCARDIOGRAM (TEE) (N/A)  1 stable overall 2 some afib persists, conts current management with amio, eliquis, beta blocker.  3 CVA conts therapies, hopefully CIR today 4 mild volume overload- conts diuresis 5 no new labs- he has subclinical hypothyroidism with elev TSH(>10) and low normal FT4( .77) - will need re check at some point 6 has foley will need voiding trial again at some point, bladder training  LOS: 20 days    John Giovanni PA-C 03/27/2019 Pager [phone]  Voiding problem prob obstructive uropahty not neurogenic bladder- flomax started 10-17- voiding trial in next few days Approval obtained for CIR in perr to peer 10-17 patient examined and medical record reviewed,agree with above note. Tharon Aquas  Trigt III 03/27/2019

## 2019-03-27 NOTE — Progress Notes (Signed)
Martin Staggers, MD  Physician  Physical Medicine and Rehabilitation  PMR Pre-admission  Signed  Date of Service:  03/20/2019  4:41 PM      Related encounter: ED to Hosp-Admission (Discharged) from 03/07/2019 in [place]        PMR Admission Coordinator Pre-Admission Assessment   Patient: Martin [name] is an 63 y.o., male MRN: [id number] DOB: 1955-11-15 Height: 5' 8" (172.7 cm) Weight: 123.4 kg   Insurance Information HMO: yes    PPO:      PCP:      IPA:      80/20:      OTHER:  PRIMARY: [employer] Medicare      Policy#: [id number]      Subscriber: Patient CM Name: Martin [name]      Phone#: [phone]     Fax#: [phone] Pre-Cert#:[id number]   Employer:  [employer] provided by Martin [name] on 10/19 for admit to CIR. Pt is approved for 8 days starting 10/19. Next review date is 10/26. Fax clinical updates to [phone] (no follow up CM assigned at this time).  Benefits:  Phone #: [phone]     Name: [email or web address] Eff. Date: 10/07/2018 - 06/08/2019      Deduct: does not have ($0)      Out of Pocket Max: $4,500 ($36.61 met)      Life Max: NA CIR: $325/day co-pay for days 1-5, $0/day co-pay for days 6+      SNF: $0/day co-pay for days 1-20, $160/day co-pay for days 21-49, $0/day co-pay for days 50-100; limited to 100 days/cal yr Outpatient: limited by medical necessity     Co-Pay: $35/visit Home Health: 100% coverage; limited by medical necessity      Co-Pay: 0% co-insurance DME: 80% coverage     Co-Pay: 20% co-insurance Providers:  SECONDARY: None      Policy#:       Subscriber:  CM Name:       Phone#:      Fax#:  Pre-Cert#:       Employer:  Benefits:  Phone #:      Name:  Eff. Date:      Deduct:       Out of Pocket Max:       Life Max CIR:       SNF:  Outpatient:      Co-Pay:  Home Health:       Co-Pay:  DME:      Co-Pay:    Medicaid Application Date:       Case Manager:  Disability Application Date:       Case Worker:    The "Data Collection  Information Summary" for patients in Inpatient Rehabilitation Facilities with attached "Privacy Act [place] Records" was provided and verbally reviewed with: Patient   Emergency Contact Information         Contact Information     Name Relation Home Work [place] [phone]   [phone]    Martin [name], Martin [name] Daughter     [phone]         Current Medical History  Patient Admitting Diagnosis: right posterior ACA infarct after recent CABG x3   History of Present Illness: Martin [name] is a 63 year old male with history of CAD/PAF-on Eliquis, OSA,  who was admitted on 03/07/19 with CP and had mild elevation in troponins with subtle EKG changes concerning for NSTEMI.  2D echo showed mild increase  in LVH with basal inferior akinesis and EF 55-60%. He underwent cardiac cath revealing severe 3V CAD and underwent CABG X 3 by Dr. Prescott Gum on 03/11/19. He tolerated extubation with out difficulty but had confusion and was noted to have left sided weakness on 10/5 am.  CT head done revealing small acute cortical distal R-ACA territory infarct and progressive small vessel disease. CTA head/neck was negative for LVO, revealed new focus of calcification in R-ACA distal A2 segment which could indicated partially calcified thrombus, fetal origins PCA as well as incidental findings of large loculated component of left effusion and retrosternal 4.3X 2.0X 5.2 cm fluid collection favored to be seroma. He developed A fib with RVR as well as VT and was started on amiodarone on 10/5. Cardizem added for rate control and due to low BP maintained on milrinone due to fluid overload. Dr. Erlinda Hong recommends BP goal 110-140 to maintain adequate cerebral perfusion.     Follow up CT head without bleed therefore heparin added on 10/06 and transitioned to Eliquis by 10/08.  EEG done due to somnolence and was negative for seizures. He has had issues with urinary retention requiring I/O caths and  foley replaced. Felt voiding problem most like obstructive not neurogenic with flomax stated on 10/17; plans for voiding trials in next few days. He did develop progressive leucocytosis with WBC- 20.1 on 10/13 and started on Cipro empirically. UCS negative and CXR done revealing stable bilateral effusion snd stable nodular density left mid lung possibly hematoma at side of prior chest tube. Leucocytosis resolving but 10/15 pm, he did have unresponsive episode with seizure type episode and 14.5 seconds asystole felt to be vasovagal in nature. Pt has been evaluated by therapies with recommendation for CIR. Pt is to be admitted to CIR on 03/27/2019.    Complete NIHSS TOTAL: 2   Patient's medical record from [facility] has been reviewed by the rehabilitation admission coordinator and physician.   Past Medical History      Past Medical History:  Diagnosis Date  . Atrial fibrillation ([place])      a. dx 10/2017. b. s/p DCCV on 11/19/2017 with successful conversion to NSR but back in AFIB 3 weeks later  . CAD (coronary artery disease)      a. s/p PCI to LAD 2001. b. s/p stenting to the RCA and mid LAD December 2011, residual distal RCA disease treated medically. b. nuc 02/2013 abnormal with fixed defect seen in inferoapical, mid-inferior, and basal inferior walls, indicative of myocardial scar, no evidence of ischemia, EF 41% (Ef 55-60% by echo same time).  . Carotid artery disease ([place])      carotid Doppler course left side 5069% stenosis  . Dysrhythmia      AFib  . Habitual alcohol use    . Hyperlipidemia    . Hypertension    . Low back pain    . OSA on CPAP    . Osteoarthritis    . Polysubstance abuse ([place])      use including marijuana and vicodin,which he obtained from the street  . PVC's (premature ventricular contractions)      status post Holter monitor normal sinus rhythm otherwise asymptomatic PVCs.  . Seasonal allergies        Family History   family history includes  Heart attack in his brother and father; Other in his mother; Stroke in his mother.   Prior Rehab/Hospitalizations Has the patient had prior rehab or hospitalizations prior to admission? No  Has the patient had major surgery during 100 days prior to admission? Yes              Current Medications   Current Facility-Administered Medications:  .  0.9 %  sodium chloride infusion, 250 mL, Intravenous, Continuous, Gold, Wayne E, PA-C .  allopurinol (ZYLOPRIM) tablet 300 mg, 300 mg, Oral, q morning - 10a, Gold, Wayne E, PA-C, 300 mg at 03/20/19 0947 .  ALPRAZolam Duanne Moron) tablet 0.25 mg, 0.25 mg, Oral, TID PRN, Grace Isaac, MD, 0.25 mg at 03/19/19 0754 .  amiodarone (PACERONE) tablet 400 mg, 400 mg, Oral, BID, Prescott Gum, Collier Salina, MD, 400 mg at 03/20/19 0949 .  aspirin EC tablet 81 mg, 81 mg, Oral, Daily, 81 mg at 03/20/19 0950 **OR** [DISCONTINUED] aspirin chewable tablet 324 mg, 324 mg, Per Tube, Daily, Gold, Wayne E, PA-C .  atorvastatin (LIPITOR) tablet 80 mg, 80 mg, Oral, q1800, Gold, Wayne E, PA-C, 80 mg at 03/19/19 1702 .  bisacodyl (DULCOLAX) EC tablet 10 mg, 10 mg, Oral, Daily, 10 mg at 03/17/19 0934 **OR** bisacodyl (DULCOLAX) suppository 10 mg, 10 mg, Rectal, Daily, Prescott Gum, Collier Salina, MD .  Chlorhexidine Gluconate Cloth 2 % PADS 6 each, 6 each, Topical, Daily, Prescott Gum, Collier Salina, MD, 6 each at 03/20/19 [phone] .  [START ON 03/21/2019] diltiazem (CARDIZEM SR) 12 hr capsule 120 mg, 120 mg, Oral, Daily, Croitoru, Mihai, MD .  docusate sodium (COLACE) capsule 200 mg, 200 mg, Oral, Daily, Gold, Wayne E, PA-C, 200 mg at 03/17/19 0934 .  doxycycline (VIBRA-TABS) tablet 100 mg, 100 mg, Oral, Q12H, Prescott Gum, Collier Salina, MD, 100 mg at 03/20/19 0947 .  fluticasone (FLONASE) 50 MCG/ACT nasal spray 1 spray, 1 spray, Each Nare, Daily, Gold, Wayne E, PA-C, 1 spray at 03/20/19 0951 .  folic acid (FOLVITE) tablet 1 mg, 1 mg, Oral, Daily, Gold, Wayne E, PA-C, 1 mg at 03/20/19 0947 .  [START ON 03/21/2019]  furosemide (LASIX) injection 40 mg, 40 mg, Intravenous, Daily, Prescott Gum, Collier Salina, MD .  guaiFENesin [facility]) 12 hr tablet 600 mg, 600 mg, Oral, BID, Prescott Gum, Collier Salina, MD, 600 mg at 03/20/19 0949 .  [CANCELED] CBG monitoring, , , Q4H **AND** insulin aspart (novoLOG) injection 0-24 Units, 0-24 Units, Subcutaneous, TID AC & HS, Prescott Gum, Collier Salina, MD, 4 Units at 03/20/19 1144 .  insulin detemir (LEVEMIR) injection 10 Units, 10 Units, Subcutaneous, Daily, Grace Isaac, MD, 10 Units at 03/20/19 [phone] .  ipratropium-albuterol (DUONEB) 0.5-2.5 (3) MG/3ML nebulizer solution 3 mL, 3 mL, Nebulization, Q6H PRN, Prescott Gum, Collier Salina, MD .  magnesium citrate solution 1 Bottle, 1 Bottle, Oral, PRN, Mosetta Anis, MD .  metoprolol tartrate (LOPRESSOR) tablet 50 mg, 50 mg, Oral, BID **OR** [DISCONTINUED] metoprolol tartrate (LOPRESSOR) 25 mg/10 mL oral suspension 12.5 mg, 12.5 mg, Per Tube, BID, Gold, Wayne E, PA-C .  mometasone-formoterol (DULERA) 200-5 MCG/ACT inhaler 2 puff, 2 puff, Inhalation, BID, Prescott Gum, Collier Salina, MD, 2 puff at 03/20/19 0820 .  ondansetron (ZOFRAN) injection 4 mg, 4 mg, Intravenous, Q6H PRN, Gold, Wayne E, PA-C, 4 mg at 03/15/19 1638 .  oxyCODONE (Oxy IR/ROXICODONE) immediate release tablet 5-10 mg, 5-10 mg, Oral, Q3H PRN, Gold, Wayne E, PA-C, 5 mg at 03/17/19 1841 .  pantoprazole (PROTONIX) EC tablet 40 mg, 40 mg, Oral, Daily, Gold, Wayne E, PA-C, 40 mg at 03/20/19 0947 .  potassium chloride SA (KLOR-CON) CR tablet 20 mEq, 20 mEq, Oral, Q4H, Prescott Gum, Collier Salina, MD, 20 mEq at 03/20/19 1145 .  sodium chloride flush (NS) 0.9 % injection 10-40 mL, 10-40 mL, Intracatheter, Q12H, Prescott Gum, Collier Salina, MD, 10 mL at 03/20/19 0955 .  sodium chloride flush (NS) 0.9 % injection 10-40 mL, 10-40 mL, Intracatheter, PRN, Prescott Gum, Collier Salina, MD .  sodium chloride flush (NS) 0.9 % injection 3 mL, 3 mL, Intravenous, Q12H, Gold, Wayne E, PA-C, 3 mL at 03/19/19 2201 .  sodium chloride flush (NS) 0.9 % injection 3 mL, 3  mL, Intravenous, PRN, Gold, Wayne E, PA-C .  thiamine (VITAMIN B-1) tablet 100 mg, 100 mg, Oral, Daily, Prescott Gum, Collier Salina, MD, 100 mg at 03/20/19 0949 .  traMADol (ULTRAM) tablet 50-100 mg, 50-100 mg, Oral, Q4H PRN, Gold, Wayne E, PA-C, 50 mg at 03/19/19 0754 .  vitamin B-12 (CYANOCOBALAMIN) tablet 100 mcg, 100 mcg, Oral, Daily, Grace Isaac, MD, 100 mcg at 03/20/19 0950   Patients Current Diet:     Diet Order                      Diet heart healthy/carb modified Room service appropriate? Yes; Fluid consistency: Thin  Diet effective now                   Precautions / Restrictions Precautions Precautions: Fall, Sternal Precaution Booklet Issued: No Precaution Comments: Lft hemiparesis Restrictions Weight Bearing Restrictions: Yes Other Position/Activity Restrictions: sternal precautions     Has the patient had 2 or more falls or a fall with injury in the past year? 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 Independent without AD.    Prior Functional Level Self Care: Did the patient need help bathing, dressing, using the toilet or eating? Independent   Indoor Mobility: Did the patient need assistance with walking from room to room (with or without device)? Independent   Stairs: Did the patient need assistance with internal or external stairs (with or without device)? Independent   Functional Cognition: Did the patient need help planning regular tasks such as shopping or remembering to take medications? Independent   Home Assistive Devices / Equipment Home Assistive Devices/Equipment: None Home Equipment: Grab bars - tub/shower, Cane - single point   Prior Device Use: Indicate devices/aids used by the patient prior to current illness, exacerbation or injury? None of the above   Current Functional Level Cognition   Arousal/Alertness: Awake/alert Overall Cognitive Status: Within Functional Limits for tasks assessed  Current Attention Level: Sustained Orientation Level: Oriented X4 Following Commands: Follows one step commands with increased time, Follows one step commands consistently Safety/Judgement: Decreased awareness of safety, Decreased awareness of deficits General Comments: Motivated and eager to work with PT Attention: Selective Selective Attention: Impaired Selective Attention Impairment: Verbal basic Memory: Impaired Memory Impairment: Retrieval deficit, Decreased recall of new information, Storage deficit Awareness: Impaired Awareness Impairment: Anticipatory impairment Problem Solving: Impaired Problem Solving Impairment: Functional basic Safety/Judgment: Impaired    Extremity Assessment (includes Sensation/Coordination)   Upper Extremity Assessment: Defer to OT evaluation LUE Deficits / Details: hemiparesis, 0/5 shoulder, 2-/5 elbow, and 3-/5 hand grossly; edematous LUE Sensation: decreased light touch, decreased proprioception LUE Coordination: decreased fine motor, decreased gross motor  Lower Extremity Assessment: LLE deficits/detail, Generalized weakness LLE Deficits / Details: 0/5 hip flexion/extension, hip abd/add, knee flexion/extension, DF/PF. PT able to perform full PROM DF/PF, knee flexion/extension LLE Sensation: WNL(sensation screen WFL per pt report)     ADLs   Overall ADL's : Needs assistance/impaired Grooming: Maximal assistance, Sitting Upper Body  Bathing: Maximal assistance, Sitting Lower Body Bathing: Maximal assistance, +2 for physical assistance, Bed level Upper Body Dressing : Total assistance, Sitting Lower Body Dressing: Total assistance, +2 for physical assistance, Bed level Toilet Transfer Details (indicate cue type and reason): deferred Functional mobility during ADLs: Total assistance, +2 for physical assistance, +2 for safety/equipment General ADL Comments: limited to EOB only, pt limited by pain, L sided hemiparesis, decreased activity tolerance,  impaired balance and cognition      Mobility   Overal bed mobility: Needs Assistance Bed Mobility: Supine to Sit Rolling: +2 for physical assistance, Max assist Supine to sit: Max assist, HOB elevated Sit to sidelying: +2 for physical assistance, Max assist General bed mobility comments: Max A for LLE management, pt assisting well with R-side and LUE as able, assist for trunk elevation and scooting Lft hip to EOB, 2/4 DOE.     Transfers   Overall transfer level: Needs assistance Equipment used: Rolling walker (2 wheeled), None Transfer via Lift Equipment: Stedy Transfers: Sit to/from Stand Sit to Stand: Mod assist, Min assist, +2 physical assistance General transfer comment: Stood from EOB x1 with Mod A of 2 using back of recliner once upright for support; hands on knees to stand. LLE locked out initially. Stood again from EOB with use of RW for support, Min A of 2 with hands on knees. Transferred to chair post ambulation.     Ambulation / Gait / Stairs / Wheelchair Mobility   Ambulation/Gait Ambulation/Gait assistance: Mod assist, +2 safety/equipment, +2 physical assistance Gait Distance (Feet): 6 Feet Assistive device: Rolling walker (2 wheeled) Gait Pattern/deviations: Step-to pattern, Step-through pattern, Decreased stance time - left, Decreased step length - right, Trunk flexed, Wide base of support General Gait Details: Slow, unsteady gait with difficulty progressing LLE, Left knee locked out into extension during stance, assist for weight shifting. LLE positioned into left hip external rotation. no buckling noted. 2-3/4 DOE. Not getting good Sp02 doing activity. Gait velocity: decreased Gait velocity interpretation: <1.31 ft/sec, indicative of household ambulator     Posture / Balance Dynamic Sitting Balance Sitting balance - Comments: Close Min guard for safety due to pt feeling uneasy EOB. Balance Overall balance assessment: Needs assistance Sitting-balance support: Feet  supported, No upper extremity supported Sitting balance-Leahy Scale: Fair Sitting balance - Comments: Close Min guard for safety due to pt feeling uneasy EOB. Postural control: Left lateral lean Standing balance support: During functional activity Standing balance-Leahy Scale: Poor Standing balance comment: Reliant on UE support and external assist. Able to stand and perform weight shifts holdong onto back of chair for support, able mini marches. LLE locked out into extension but able to be in mid range with cues without buckling.     Special needs/care consideration BiPAP/CPAP : yes CPAP  CPM : no Continuous Drip IV : 0.9% sodium chloride infusion  Dialysis : no        Days : no Life Vest : no Oxygen : 2-3 L/min  Special Bed : no Trach Size : no Wound Vac (area) : no      Location : no Skin: abrasion to left leg, ecchymosis to leg; arm (right; left). Surgical incision to chest, close right leg incision, laceration to sternum lower incision position wire                      Bowel mgmt: continent, last BM: 03/26/2019 Bladder mgmt: continent; urinary catheter in place Diabetic mgmt: no Behavioral consideration : no Chemo/radiation :  no    Previous Home Environment (from acute therapy documentation) Living Arrangements: Spouse/significant other  Lives With: Spouse Available Help at Discharge: Family, Available 24 hours/day Type of Home: House Home Layout: One level, Other (Comment)(den with 2 steps up, on L side ) Home Access: Other (comment)(threshold) Bathroom Shower/Tub: Multimedia programmer: Standard Home Care Services: No   Discharge Living Setting Plans for Discharge Living Setting: Patient's home, Lives with (comment)(spouse) Type of Home at Discharge: House Discharge Home Layout: One level(*but has sunken deen with 2 steps to get to kitchen/LR) Discharge Home Access: Level entry Discharge Bathroom Shower/Tub: Walk-in shower Discharge Bathroom Toilet: Standard  Discharge Bathroom Accessibility: Yes How Accessible: Accessible via walker Does the patient have any problems obtaining your medications?: No   Social/Family/Support Systems Patient Roles: Spouse(full time worker) Contact Information: wife: Kenney Houseman (home [phone]; cell: [phone]) Anticipated Caregiver: wife Anticipated Caregiver's Contact Information: see above Ability/Limitations of Caregiver: Min A Caregiver Availability: 24/7 Discharge Plan Discussed with Primary Caregiver: Yes Is Caregiver In Agreement with Plan?: Yes Does Caregiver/Family have Issues with Lodging/Transportation while Pt is in Rehab?: No   Goals/Additional Needs Patient/Family Goal for Rehab: PT/OT: Supervision; SLP: Mod I Expected length of stay: 8-12 days Cultural Considerations: NA Dietary Needs: heart healthy/carb modified; thin liquids Equipment Needs: TBD Pt/Family Agrees to Admission and willing to participate: Yes Program Orientation Provided & Reviewed with Pt/Caregiver Including Roles  & Responsibilities: Yes  Barriers to Discharge: Home environment access/layout, New oxygen  Barriers to Discharge Comments: sunken level for living room and kitchen   Decrease burden of Care through IP rehab admission: NA   Possible need for SNF placement upon discharge: Not anticipated; pt has good social support at DC from his wife and has good prognosis for further progress through CIR.    Patient Condition: I have reviewed medical records from [facility], spoken with MD, PA, RN, PT, and patient and spouse. I met with patient at the bedside for inpatient rehabilitation assessment.  Patient will benefit from ongoing PT and OT, can actively participate in 3 hours of therapy a day 5 days of the week, and can make measurable gains during the admission.  Patient will also benefit from the coordinated team approach during an Inpatient Acute Rehabilitation admission.  The patient will receive intensive  therapy as well as Rehabilitation physician, nursing, social worker, and care management interventions.  Due to bladder management, bowel management, safety, skin/wound care, disease management, medication administration, pain management and patient education the patient requires 24 hour a day rehabilitation nursing.  The patient is currently Min/Mod A x2 with mobility and Min to Mod A x2 for basic ADLs.  Discharge setting and therapy post discharge at home with home health is anticipated.  Patient has agreed to participate in the Acute Inpatient Rehabilitation Program and will admit 03/27/2019.   Preadmission Screen Completed By:  Raechel Ache, 03/20/2019 4:41 PM ______________________________________________________________________   Discussed status with Dr. Naaman Plummer on 03/27/2019 at 10:45AM and received approval for admission today.   Admission Coordinator:  Raechel Ache, OT, time 10:45AM/Date 03/27/2019    Assessment/Plan: Diagnosis: right ACA infarct after CABG 1. Does the need for close, 24 hr/day Medical supervision in concert with the patient's rehab needs make it unreasonable for this patient to be served in a less intensive setting? Yes 2. Co-Morbidities requiring supervision/potential complications: CAD, afib, neurogenic bladder, left lung effusion 3. Due to bladder management, bowel management, safety, skin/wound care, disease management, medication administration, pain management and patient  education, does the patient require 24 hr/day rehab nursing? Yes 4. Does the patient require coordinated care of a physician, rehab nurse, PT, OT, and SLP to address physical and functional deficits in the context of the above medical diagnosis(es)? Yes Addressing deficits in the following areas: balance, endurance, locomotion, strength, transferring, bowel/bladder control, bathing, dressing, feeding, grooming, toileting, cognition, swallowing and psychosocial support 5. Can the patient actively  participate in an intensive therapy program of at least 3 hrs of therapy 5 days a week? Yes 6. The potential for patient to make measurable gains while on inpatient rehab is excellent 7. Anticipated functional outcomes upon discharge from inpatient rehab: supervision PT, supervision OT, modified independent SLP 8. Estimated rehab length of stay to reach the above functional goals is: 8-12 days 9. Anticipated discharge destination: Home 10. Overall Rehab/Functional Prognosis: excellent     MD Signature: Martin Staggers, MD, [place] [facility] 03/27/2019         Revision History Date/Time User Provider Type Action  03/27/2019 11:08 AM Martin Staggers, MD Physician Sign  03/27/2019 10:48 AM Raechel Ache, OT Rehab Admission Coordinator Share   View Details Report

## 2019-03-27 NOTE — Progress Notes (Signed)
Patient arrived on rehab unit today, were he was informed about patient safety plan and rehab process. 

## 2019-03-27 NOTE — Progress Notes (Addendum)
Progress Note  Patient Name: Martin [name] Date of Encounter: 03/27/2019  Primary Cardiologist: Kate Sable, MD   Subjective  O/N event: Insurance authorization received for CIR  Martin [name] was examined and evaluated at bedside this am. He was observed sitting in bed eating breakfast. Discussed likely discharge to CIR some time early this week. Denies any chest pain, palpitations, dyspnea.  Inpatient Medications    Scheduled Meds: . allopurinol  300 mg Oral q morning - 10a  . amiodarone  200 mg Oral Daily  . apixaban  5 mg Oral BID  . aspirin EC  81 mg Oral Daily  . atorvastatin  80 mg Oral q1800  . bisacodyl  10 mg Rectal Daily   Or  . bisacodyl  10 mg Oral Daily  . Chlorhexidine Gluconate Cloth  6 each Topical Daily  . citalopram  10 mg Oral Daily  . docusate sodium  200 mg Oral Daily  . fluticasone  1 spray Each Nare Daily  . folic acid  1 mg Oral Daily  . furosemide  40 mg Oral Daily  . guaiFENesin  600 mg Oral BID  . metoprolol tartrate  12.5 mg Oral q morning - 10a  . mometasone-formoterol  2 puff Inhalation BID  . mupirocin cream   Topical BID  . pantoprazole  40 mg Oral Daily  . potassium chloride  20 mEq Oral Daily  . sodium chloride flush  10-40 mL Intracatheter Q12H  . sodium chloride flush  3 mL Intravenous Q12H  . tamsulosin  0.4 mg Oral QPC supper  . thiamine  100 mg Oral Daily  . traZODone  50 mg Oral QHS  . vitamin B-12  100 mcg Oral Daily   Continuous Infusions: . sodium chloride     PRN Meds: ipratropium-albuterol, magnesium citrate, oxyCODONE, polyethylene glycol, sodium chloride flush, sodium chloride flush, traMADol, witch hazel-glycerin   Vital Signs    Vitals:   03/26/19 1900 03/26/19 2112 03/26/19 2300 03/27/19 0500  BP: 140/80  (!) 150/79 126/81  Pulse: 78 89 66 76  Resp: (!) 21 15 20 19   Temp: 98.7 F (37.1 C)  98.1 F (36.7 C) 98.5 F (36.9 C)  TempSrc: Oral  Oral Oral  SpO2: 95% 95% 96% 95%  Weight:    123.2 kg   Height:        Intake/Output Summary (Last 24 hours) at 03/27/2019 0657 Last data filed at 03/26/2019 2300 Gross per 24 hour  Intake 920 ml  Output 1500 ml  Net -580 ml   Filed Weights   03/25/19 0303 03/26/19 0100 03/27/19 0500  Weight: 120.4 kg 122.1 kg 123.2 kg    Telemetry    A.fib with rate control. HR ~60-80 - Personally Reviewed  ECG    No new tracing this am. - Personally Reviewed  Physical Exam   Gen: Well-developed, obese, NAD HEENT: NCAT head, hearing intact, EOMI, PERRL Neck: supple, ROM intact, no JVD CV: RRR, S1, S2 normal, No rubs, no murmurs, no gallops Pulm: CTAB, No rales, no wheezes Abd: Soft, BS+, NTND, No rebound, no guarding Extm: ROM intact, Peripheral pulses intact, 1+ peripheral edema Skin: Dry, Warm, normal turgor, chest surgical scare appear clean w/o drainage, erythema or edema Neuro: AAOx3  Labs    Chemistry Recent Labs  Lab 03/21/19 0500 03/24/19 0335  NA 129* 129*  K 4.1 3.8  CL 94* 94*  CO2 26 26  GLUCOSE 118* 99  BUN 16 14  CREATININE 0.70 0.77  CALCIUM  8.5* 8.5*  GFRNONAA >60 >60  GFRAA >60 >60  ANIONGAP 9 9     Hematology Recent Labs  Lab 03/21/19 0500 03/22/19 0425 03/24/19 0335  WBC 20.1* 17.6* 10.8*  RBC 3.84* 3.47* 3.42*  HGB 12.7* 11.4* 11.1*  HCT 37.0* 34.6* 33.7*  MCV 96.4 99.7 98.5  MCH 33.1 32.9 32.5  MCHC 34.3 32.9 32.9  RDW 13.3 13.3 13.3  PLT 400 351 319    Cardiac EnzymesNo results for input(s): TROPONINI in the last 168 hours. No results for input(s): TROPIPOC in the last 168 hours.   BNPNo results for input(s): BNP, PROBNP in the last 168 hours.   DDimer No results for input(s): DDIMER in the last 168 hours.   Radiology    No results found.  Cardiac Studies   Echo 03/08/19: IMPRESSIONS  1. Left ventricular ejection fraction, by visual estimation, is 55 to 60%. The left ventricle has normal function. Normal left ventricular size. There is mildly increased left ventricular  hypertrophy. Basal inferior akinesis. 2. Left ventricular diastolic Doppler parameters are consistent with impaired relaxation pattern of LV diastolic filling. 3. Global right ventricle has normal systolic function.The right ventricular size is normal. No increase in right ventricular wall thickness. 4. The aortic valve is tricuspid Aortic valve regurgitation is trivial by color flow Doppler. Mild aortic valve stenosis. Mean gradient 10 mmHg. 5. There is mild dilatation of the ascending aorta measuring 42 mm. 6. Left atrial size was moderately dilated. 7. Right atrial size was mildly dilated. 8. The tricuspid valve is normal in structure. Tricuspid valve regurgitation was not visualized by color flow Doppler. 9. The mitral valve is normal in structure. No evidence of mitral valve regurgitation. No evidence of mitral stenosis. 10. TR signal is inadequate for assessing pulmonary artery systolic pressure. 11. The inferior vena cava is normal in size with greater than 50% respiratory variability, suggesting right atrial pressure of 3 mmHg.  Carotid Dopplers 03/09/2019:1 to 39% ICA stenosis bilaterally.  Left heart cath 03/08/2019:  Severe three-vessel coronary calcification  Patent left main  Eccentric ostial 50 to 75% LAD with mid vessel 40 to 50% narrowing. Mid to distal LAD stent is widely patent.  Ramus intermedius contains proximal 90% stenosis.  Circumflex contains 99% proximal/ostial stenosis.  RCA is heavily calcified and stented. The artery is totally occluded in the proximal to mid segment. Left-to-right collaterals are noted.  Mildly reduced LV systolic function with EF estimated to be 40%. LVEDP is 28 mmHg. Findings compatible with acute on chronic combined   Patient Profile     63 y.o. male w/ PMh of prior CAD s/p PCI, PAF, HTN, OSA on CPAP w/ NSTEMI w/ multivessel dz requiring CABG. Complicated by acute ACA stroke  Assessment & Plan    Syncopal Event  03/23/19 PAF Developed A.fib w/ RVR during hospitalization. On amio. Still irregular but not in RVR. Heart rate @ 60-70. Blood pressure stable. Episode of what appears to be vasovagal syncope on 03/23/19. No further episodes since. Current HR ~60. Chad-vasc score of 5 (stroke, Dm, HTN, age) - PT/OT - C/w oral amio 200mg  daily, metoprolol 12.5mg  daily - C/w Eliquis  Acute ACA stroke Followed by neurology. PT/OT recommend CIR. LLE weak but improving. Repeat head CT / EEG on 03/22/19 without acute findings - C/w Eliquis - CIR per PT/OT, likely today  NSTEMI Multi-vessel CAD Cath with multi-vessel dz. S/p CABG. No significant chest pain per pt. Post-op Echo w/ no significant different.. - C/w asa, atorvastatin -  Start DAPT when cleared by CT surgery.  Diastolic heart failure TEE post Cabg w/ preserved EF. Urine output 1.5L yesterday Weight this am 123.1kg - close to dry weight. - C/w furosemide 40 daily - Daily weights, strict I&Os - Trend/replete K, Mag as appropriate     For questions or updates, please contact [place] Please consult [email or web address] for contact info under Cardiology/STEMI.     Signed, Gilberto Better, MD PGY-2, [facility] Pager: [phone] 03/27/2019, 6:57 AM    The patient was seen, examined and discussed with resident Gilberto Better, MD and I agree with the above.   He is walking with PT, awaiting inpatient rehab placement,no chest pain, mild LE edema, but significantly improved, continue PO lasix.   Ena Dawley, MD 03/27/2019

## 2019-03-27 NOTE — Progress Notes (Signed)
Inpatient Rehabilitation-Admissions Coordinator   I have received insurance approval and approval from attending service for admit to CIR today. Pt still wanting to proceed with CIR at this time. Will update RN, CM/SW on plan. Forms reviewed and signed.   Please call if questions.   Jhonnie Garner, OTR/L  Rehab Admissions Coordinator  [phone] 03/27/2019 10:06 AM

## 2019-03-27 NOTE — Plan of Care (Signed)
  Problem: Consults Goal: RH STROKE PATIENT EDUCATION Description: See Patient Education module for education specifics  Outcome: Progressing   Problem: Consults Goal: RH STROKE PATIENT EDUCATION Description: See Patient Education module for education specifics  Outcome: Progressing   Problem: RH BOWEL ELIMINATION Goal: RH STG MANAGE BOWEL WITH ASSISTANCE Description: STG Manage Bowel with Assistance. Outcome: Progressing Flowsheets (Taken 03/27/2019 1515) STG: Pt will manage bowels with assistance: 3-Moderate assistance Goal: RH STG MANAGE BOWEL W/MEDICATION W/ASSISTANCE Description: STG Manage Bowel with Medication with Assistance. Outcome: Progressing Flowsheets (Taken 03/27/2019 1515) STG: Pt will manage bowels with medication with assistance: 3-Moderate assistance   Problem: RH BLADDER ELIMINATION Goal: RH STG MANAGE BLADDER WITH ASSISTANCE Description: STG Manage Bladder With Assistance Outcome: Progressing Flowsheets (Taken 03/27/2019 1515) STG: Pt will manage bladder with assistance: (foley cath) 1-Total assistance Goal: RH STG MANAGE BLADDER WITH EQUIPMENT WITH ASSISTANCE Description: STG Manage Bladder With Equipment With Assistance Outcome: Progressing Flowsheets (Taken 03/27/2019 1515) STG: Pt will manage bladder with equipment with assistance: 1-Total assistance   Problem: RH SKIN INTEGRITY Goal: RH STG SKIN FREE OF INFECTION/BREAKDOWN Outcome: Progressing Goal: RH STG MAINTAIN SKIN INTEGRITY WITH ASSISTANCE Description: STG Maintain Skin Integrity With Assistance. Outcome: Progressing Flowsheets (Taken 03/27/2019 1515) STG: Maintain skin integrity with assistance: 3-Moderate assistance Goal: RH STG ABLE TO PERFORM INCISION/WOUND CARE W/ASSISTANCE Description: STG Able To Perform Incision/Wound Care With Assistance. Outcome: Progressing Flowsheets (Taken 03/27/2019 1515) STG: Pt will be able to perform incision/wound care with assistance: 4-Minimal  assistance   Problem: RH SAFETY Goal: RH STG ADHERE TO SAFETY PRECAUTIONS W/ASSISTANCE/DEVICE Description: STG Adhere to Safety Precautions With Assistance/Device. Outcome: Progressing Flowsheets (Taken 03/27/2019 1515) STG:Pt will adhere to safety precautions with assistance/device: 3-Moderate assistance Goal: RH STG DECREASED RISK OF FALL WITH ASSISTANCE Description: STG Decreased Risk of Fall With Assistance. Outcome: Progressing Flowsheets (Taken 03/27/2019 1515) [id number] risk of fall  with assistance/device: 3-Moderate assistance   Problem: RH COGNITION-NURSING Goal: RH STG USES MEMORY AIDS/STRATEGIES W/ASSIST TO PROBLEM SOLVE Description: STG Uses Memory Aids/Strategies With Assistance to Problem Solve. Outcome: Progressing Goal: RH STG ANTICIPATES NEEDS/CALLS FOR ASSIST W/ASSIST/CUES Description: STG Anticipates Needs/Calls for Assist With Assistance/Cues. Outcome: Progressing   Problem: RH PAIN MANAGEMENT Goal: RH STG PAIN MANAGED AT OR BELOW PT'S PAIN GOAL Outcome: Progressing   Problem: RH KNOWLEDGE DEFICIT Goal: RH STG INCREASE KNOWLEDGE OF DIABETES Outcome: Progressing Goal: RH STG INCREASE KNOWLEDGE OF HYPERTENSION Outcome: Progressing Goal: RH STG INCREASE KNOWLEDGE OF DYSPHAGIA/FLUID INTAKE Outcome: Progressing Goal: RH STG INCREASE KNOWLEGDE OF HYPERLIPIDEMIA Outcome: Progressing

## 2019-03-28 ENCOUNTER — Inpatient Hospital Stay (HOSPITAL_COMMUNITY): Payer: Medicare Other | Admitting: Occupational Therapy

## 2019-03-28 ENCOUNTER — Inpatient Hospital Stay (HOSPITAL_COMMUNITY): Payer: Medicare Other | Admitting: Speech Pathology

## 2019-03-28 ENCOUNTER — Inpatient Hospital Stay (HOSPITAL_COMMUNITY): Payer: Medicare Other

## 2019-03-28 LAB — COMPREHENSIVE METABOLIC PANEL
ALT: 37 U/L (ref 0–44)
AST: 19 U/L (ref 15–41)
Albumin: 2.4 g/dL — ABNORMAL LOW (ref 3.5–5.0)
Alkaline Phosphatase: 83 U/L (ref 38–126)
Anion gap: 9 (ref 5–15)
BUN: 10 mg/dL (ref 8–23)
CO2: 27 mmol/L (ref 22–32)
Calcium: 8.8 mg/dL — ABNORMAL LOW (ref 8.9–10.3)
Chloride: 100 mmol/L (ref 98–111)
Creatinine, Ser: 0.58 mg/dL — ABNORMAL LOW (ref 0.61–1.24)
GFR calc Af Amer: >60 mL/min (ref >60)
GFR calc non Af Amer: >60 mL/min (ref >60)
Glucose, Bld: 110 mg/dL — ABNORMAL HIGH (ref 70–99)
Potassium: 3.8 mmol/L (ref 3.5–5.1)
Sodium: 136 mmol/L (ref 135–145)
Total Bilirubin: 1 mg/dL (ref 0.3–1.2)
Total Protein: 5.4 g/dL — ABNORMAL LOW (ref 6.5–8.1)

## 2019-03-28 LAB — CBC WITH DIFFERENTIAL/PLATELET
Abs Immature Granulocytes: 0.02 10*3/uL (ref 0.00–0.07)
Basophils Absolute: 0 10*3/uL (ref 0.0–0.1)
Basophils Relative: 1 %
Eosinophils Absolute: 0.1 10*3/uL (ref 0.0–0.5)
Eosinophils Relative: 2 %
HCT: 31.4 % — ABNORMAL LOW (ref 39.0–52.0)
Hemoglobin: 10.3 g/dL — ABNORMAL LOW (ref 13.0–17.0)
Immature Granulocytes: 0 %
Lymphocytes Relative: 15 %
Lymphs Abs: 1 10*3/uL (ref 0.7–4.0)
MCH: 33 pg (ref 26.0–34.0)
MCHC: 32.8 g/dL (ref 30.0–36.0)
MCV: 100.6 fL — ABNORMAL HIGH (ref 80.0–100.0)
Monocytes Absolute: 0.6 10*3/uL (ref 0.1–1.0)
Monocytes Relative: 9 %
Neutro Abs: 4.7 10*3/uL (ref 1.7–7.7)
Neutrophils Relative %: 73 %
Platelets: 241 10*3/uL (ref 150–400)
RBC: 3.12 MIL/uL — ABNORMAL LOW (ref 4.22–5.81)
RDW: 13.6 % (ref 11.5–15.5)
WBC: 6.4 10*3/uL (ref 4.0–10.5)
nRBC: 0 % (ref 0.0–0.2)

## 2019-03-28 LAB — URINE CULTURE: Culture: NO GROWTH

## 2019-03-28 MED ORDER — DICLOFENAC SODIUM 1 % TD GEL
2.0000 g | Freq: Four times a day (QID) | TRANSDERMAL | Status: DC
  Administered 2019-03-28 – 2019-04-10 (×48): 2 g via TOPICAL
  Filled 2019-03-28: qty 100

## 2019-03-28 MED ORDER — ALLOPURINOL 300 MG PO TABS
300.0000 mg | ORAL_TABLET | Freq: Every day | ORAL | Status: DC
  Administered 2019-03-28 – 2019-04-11 (×15): 300 mg via ORAL
  Filled 2019-03-28 (×14): qty 1

## 2019-03-28 NOTE — Progress Notes (Signed)
Patient information reviewed and entered into [facility] System by Becky Shaurya Rawdon, PPS coordinator. Information including medical coding, function ability, and quality indicators will be reviewed and updated through discharge.   

## 2019-03-28 NOTE — Progress Notes (Signed)
Speech Language Pathology Daily Session Note  Patient Details  Name: Martin [name] MRN: [id number] Date of Birth: 1955/08/17  Today's Date: 03/28/2019 SLP Individual Time: 1435-1500 SLP Individual Time Calculation (min): 25 min  Short Term Goals: Week 1: SLP Short Term Goal 1 (Week 1): Pt will selectively attend to tasks for 15 minutes with Min A for redirection. SLP Short Term Goal 2 (Week 1): Pt will complete functional tasks with Min A for semi-complex problem solving. SLP Short Term Goal 3 (Week 1): Pt will verbally recall 2 safety and/or medical precautions related to deficits with Min A verbal cues.  Skilled Therapeutic Interventions: Pt was seen for skilled ST targeting cognitive goals. Pt's wife was present and supportive throughout session. She initially denied any perceived cognitive deficits in pt, however eventually reported intermittent but improving confusion throughout admission. She stated some short term memory and attention deficits are believed to be baseline; SLP provided rationale for treatment in these areas to maximize pt's safety and functional independence at home and work (as it is his goal to return to work full time once medically cleared). Pt and wife in agreement regarding ST goals. SLP further facilitated session with basic money and medication management tasks from the ALFA, which pt completed with 1 supervision verbal cue during each task. Pt was left in bed with alarm set, all needs within reach, and wife still present. Continue per current plan of care.       Pain Pain Assessment Pain Scale: Faces Faces Pain Scale: No hurt   Therapy/Group: Individual Therapy  Arbutus Leas 03/28/2019, 3:11 PM

## 2019-03-28 NOTE — Progress Notes (Signed)
  Subjective: C/o gout pain R ankle- lasix stopped Cont with rate controlled afib Sternal incison clean, dry Objective: Vital signs in last 24 hours: Temp:  [97.6 F (36.4 C)-98.5 F (36.9 C)] 97.6 F (36.4 C) (10/20 1304) Pulse Rate:  [66-88] 75 (10/20 1304) Resp:  [17-20] 20 (10/20 1304) BP: (112-133)/(59-79) 112/59 (10/20 1304) SpO2:  [97 %-98 %] 98 % (10/20 1304) Weight:  [id number] kg] 123 kg (10/20 0500)  Hemstableodynamic parameters for last 24 hours:    Intake/Output from previous day: 10/19 0701 - 10/20 0700 In: 400 [P.O.:400] Out: 1200 [Urine:1200] Intake/Output this shift: Total I/O In: 120 [P.O.:120] Out: -   EXAM  Alert, responsive Lungs clear wekness LLE, ice pack on R ankle HR irreg 82/min  Lab Results: Recent Labs    03/28/19 0512  WBC 6.4  HGB 10.3*  HCT 31.4*  PLT 241   BMET:  Recent Labs    03/28/19 0512  NA 136  K 3.8  CL 100  CO2 27  GLUCOSE 110*  BUN 10  CREATININE 0.58*  CALCIUM 8.8*    PT/INR: No results for input(s): LABPROT, INR in the last 72 hours. ABG    Component Value Date/Time   PHART 7.478 (H) 03/16/2019 1239   HCO3 24.2 03/16/2019 1239   TCO2 25 03/16/2019 1239   ACIDBASEDEF 3.0 (H) 03/10/2019 1833   O2SAT 62.5 03/18/2019 0430   CBG (last 3)  Recent Labs    03/26/19 2122 03/27/19 0628 03/27/19 1102  GLUCAP 184* 121* 154*    Assessment/Plan: S/P  Will stop lasix due to gout Appreciate CIR focused care   LOS: 1 day    Tharon Aquas Trigt III 03/28/2019

## 2019-03-28 NOTE — Progress Notes (Signed)
[facility] PHYSICAL MEDICINE & REHABILITATION PROGRESS NOTE   Subjective/Complaints: Had a reasonable night. Right ankle sore with wb this morning during OT. Foley just removed  ROS: Patient denies fever, rash, sore throat, blurred vision, nausea, vomiting, diarrhea, cough, shortness of breath or chest pain, joint or back pain, headache, or mood change.    Objective:   No results found. Recent Labs    03/28/19 0512  WBC 6.4  HGB 10.3*  HCT 31.4*  PLT 241   Recent Labs    03/28/19 0512  NA 136  K 3.8  CL 100  CO2 27  GLUCOSE 110*  BUN 10  CREATININE 0.58*  CALCIUM 8.8*    Intake/Output Summary (Last 24 hours) at 03/28/2019 0906 Last data filed at 03/28/2019 0700 Gross per 24 hour  Intake 400 ml  Output 1200 ml  Net -800 ml     Physical Exam: Vital Signs Blood pressure 133/71, pulse 66, temperature 98.5 F (36.9 C), temperature source Oral, resp. rate 17, weight 123 kg, SpO2 97 %.  Constitutional: No distress . Vital signs reviewed. HEENT: EOMI, oral membranes moist Neck: supple Cardiovascular: RRR without murmur. No JVD    Respiratory: CTA Bilaterally without wheezes or rales. Normal effort   O2 [place] GI: BS +, non-tender, non-distended  Respiratory: Norespiratory distress. Chest wall incision remains C/D/I GI: He exhibitsno distension. There isno abdominal tenderness. There isno rebound.  Musculoskeletal:  General: Edemapresent tr to 1+ LE's   Right ankle tender with palpation especially laterally. Limited passive ADF/APF. Antalgic with weight bearing.  Neurological: He is alertand oriented to person, place, and time. He displaysnormal reflexes. Nocranial nerve deficit.Coordinationnormal.  Speech clear. Minimal left central 7. LUE 4/5. LLE: 3/5 HF, 3+KE and 0-tr ADF/APF.--no changes.  RUE and RLE 4+/5. Normal sensation. Good insight and awareness.  Skin: He isnot diaphoretic. Skin cool LLE. Multiple scars and abrasions bilateral  legs. LLE donor sites clean dry and intact. Barrier cream to raw area along sacrum/gluteal folds Psychiatric: pleasant and appropraite.    Assessment/Plan: 1. Functional deficits secondary to right ACA infarct which require 3+ hours per day of interdisciplinary therapy in a comprehensive inpatient rehab setting.  Physiatrist is providing close team supervision and 24 hour management of active medical problems listed below.  Physiatrist and rehab team continue to assess barriers to discharge/monitor patient progress toward functional and medical goals  Care Tool:  Bathing              Bathing assist       Upper Body Dressing/Undressing Upper body dressing        Upper body assist      Lower Body Dressing/Undressing Lower body dressing            Lower body assist       Toileting Toileting    Toileting assist       Transfers Chair/bed transfer  Transfers assist           Locomotion Ambulation   Ambulation assist              Walk 10 feet activity   Assist           Walk 50 feet activity   Assist           Walk 150 feet activity   Assist           Walk 10 feet on uneven surface  activity   Assist  Wheelchair     Assist               Wheelchair 50 feet with 2 turns activity    Assist            Wheelchair 150 feet activity     Assist          Blood pressure 133/71, pulse 66, temperature 98.5 F (36.9 C), temperature source Oral, resp. rate 17, weight 123 kg, SpO2 97 %.  Medical Problem List and Plan: 1.Left hemiparesis and decreased mobilitysecondary to right ACA infarct after CABG --Patient is beginning CIR therapies today including PT, OT, and SLP  -ordered PRAFO LLE 2. Antithrombotics: -DVT/anticoagulation:Pharmaceutical:Other (comment)--Eliquis -antiplatelet therapy: On low dose ASA. 3. Pain Management:Oxycodone  prn  -right ankle pain: appears to be OA, has limited ROM in general in ankle   -scheduled voltaren gel, prn ice   -expect it to improve as he mobilizes more 4. Mood:LCSW to follow for evaluation and support. -antipsychotic agents: N/A 5. Neuropsych: This patientiscapable of making decisions onhisown behalf. 6. Skin/Wound Care:Monitor wound for healing. Continue protein supplement to help promote healing. -PRAFO LLE -barrier cream for buttock 7. Fluids/Electrolytes/Nutrition:encourage PO  -I personally reviewed the patient's labs today.     8. CAD s/p CABG:Continue sternal precautions. ON ASA and Lipitor. 9. HTN:BP goal 110-140 to allow for adequate cerebral perfusion. 10 Leukocytosis:Resolved 6.4 10/20  --has completed 3 day course of Cipro and doxycyline on [id number]. 11.Diastolic heart failure: Strict I/O and daily weights. On Lasix daily with low dose BB.   Filed Weights   03/28/19 0500  Weight: 123 kg    12. Abnormal LFTs: WNL 10/20  13. Malnutrition: Pre-albumin- 14.1  -intake picking up. Continue to encourage PO 14. A fib: Monitor for recurrent bradycardia--check HR tid--on Eliquis bid and back on amiodarone and metoprolol daily 15. Persistent hyponatremia: Resolved 136 10/20  -will hold off on any further testing   -likely related to intake, lasix  -recheck later this week 16. OSA/Hypoxia: PFTs with moderate to severe obstructive airway disease--now on Dulera bid.   -CPAP with oxygen bleed in for now.   - wean oxygen as able.  17. Urinary retention:DC'ed foley---> bladder training.   -ua negative, ucx pending    LOS: 1 days A FACE TO FACE EVALUATION WAS PERFORMED  Meredith Staggers 03/28/2019, 9:06 AM

## 2019-03-28 NOTE — Evaluation (Signed)
Occupational Therapy Assessment and Plan  Patient Details  Name: Martin [name] MRN: [id number] Date of Birth: 26-Oct-1955  OT Diagnosis: acute pain, cognitive deficits, hemiplegia affecting non-dominant side and pain in joint Rehab Potential: Rehab Potential (ACUTE ONLY): Good ELOS: 10-12 days   Today's Date: 03/28/2019 OT Individual Time: 0569-7948 OT Individual Time Calculation (min): 60 min     Problem List:  Patient Active Problem List   Diagnosis Date Noted  . Debility 03/27/2019  . 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 Lethargy   . Persistent atrial fibrillation ([place])   . Cerebral embolism with cerebral infarction 03/13/2019  . S/P CABG x 3 03/10/2019  . NSTEMI (non-ST elevated myocardial infarction) ([place]) 03/07/2019  . Unstable angina ([place]) 03/07/2019  . OSA on CPAP 05/27/2016  . Obesity 06/10/2011  . Insomnia 06/10/2011  . PVC's (premature ventricular contractions)   . CAD (coronary artery disease)   . Carotid artery disease ([place])   . DEGENERATIVE JOINT DISEASE 07/28/2010  . Dyslipidemia 06/17/2010  . SUBSTANCE ABUSE, MULTIPLE 06/17/2010  . Essential hypertension 06/17/2010  . CAD 06/17/2010  . BRADYCARDIA 06/17/2010    Past Medical History:  Past Medical History:  Diagnosis Date  . Atrial fibrillation ([place])    a. dx 10/2017. b. s/p DCCV on 11/19/2017 with successful conversion to NSR but back in AFIB 3 weeks later  . CAD (coronary artery disease)    a. s/p PCI to LAD 2001. b. s/p stenting to the RCA and mid LAD December 2011, residual distal RCA disease treated medically. b. nuc 02/2013 abnormal with fixed defect seen in inferoapical, mid-inferior, and basal inferior walls, indicative of myocardial scar, no evidence of ischemia, EF 41% (Ef 55-60% by echo same time).  . Carotid artery disease ([place])    carotid Doppler course left side 5069% stenosis  . Dysrhythmia    AFib  . Habitual alcohol use   .  Hyperlipidemia   . Hypertension   . Low back pain   . OSA on CPAP   . Osteoarthritis   . Polysubstance abuse ([place])    use including marijuana and vicodin,which he obtained from the street  . PVC's (premature ventricular contractions)    status post Holter monitor normal sinus rhythm otherwise asymptomatic PVCs.  . Seasonal allergies    Past Surgical History:  Past Surgical History:  Procedure Laterality Date  . CARDIOVERSION N/A 11/19/2017   Procedure: CARDIOVERSION;  Surgeon: Herminio Commons, MD;  [facility];  Service: Cardiovascular;  Laterality: N/A;  . CORONARY ANGIOPLASTY WITH STENT PLACEMENT  2011  . CORONARY ARTERY BYPASS GRAFT N/A 03/10/2019   Procedure: CORONARY ARTERY BYPASS GRAFTING (CABG)X 3 Using Left Internal Mammary Artery and Right Saphenous Vein Grafts;  Surgeon: Ivin Poot, MD;  Location: [facility];  Service: Open Heart Surgery;  Laterality: N/A;  . LEFT HEART CATH AND CORONARY ANGIOGRAPHY N/A 03/08/2019   Procedure: LEFT HEART CATH AND CORONARY ANGIOGRAPHY;  Surgeon: Belva Crome, MD;  Location: [facility];  Service: Cardiovascular;  Laterality: N/A;  . TEE WITHOUT CARDIOVERSION N/A 03/10/2019   Procedure: TRANSESOPHAGEAL ECHOCARDIOGRAM (TEE);  Surgeon: Prescott Gum, Collier Salina, MD;  Location: [facility];  Service: Open Heart Surgery;  Laterality: N/A;  . TONSILLECTOMY      Assessment & Plan Clinical Impression: Patient is a 63 y.o. year old male with history of CAD/PAF-on Eliquis, OSA, who was admitted on 03/07/19 with CP and had mild elevation in troponins  with subtle EKG changes concerning for NSTEMI. 2D echo showed mild increase in LVH with basal inferior akinesis and EF 55-60%. He underwent cardiac cath revealing severe 3V CAD and underwent CABG X 3 by Dr. Prescott Gum on 03/11/19. He tolerated extubation with out difficulty but had confusion and was noted to have left sided weakness on 10/5 am. CT head done revealing small acute cortical distal R-ACA  territory infarct and progressive small vessel disease. CTA head/neck was negative for LVO, revealed new focus of calcification in R-ACA distal A2 segment which could indicated partially calcified thrombus, fetal origins PCA as well as incidental findings of large loculated component of left effusion and retrosternal 4.3X 2.0X 5.2 cm fluid collection favored to be seroma. He developed A fib with RVR as well as VT and was started on amiodarone on 10/5. Cardizem added for rate control and due to low BP maintained on milrinone due to fluid overload. Dr. Erlinda Hong recommends BP goal 110-140 to maintain adequate cerebral perfusion.   Follow up CT head without bleed therefore heparin added on 10/06 and transitioned to Eliquis by 10/08. EEG done due to somnolence and was negative for seizures. He has had issues with urinary retention requiring I/O caths and foley replaced. He did develop progressive leucocytosis with WBC- 20.1 on 10/13 thereforestarted ondoxycycline andCiproadded 10/15.UCS negative and CXR done revealing stable bilateral effusion snd stable nodular density left mid lung possibly hematoma at side of prior chest tube.Leucocytosis resolving but 10/15 pm, he did have unresponsive episode with seizure type episode and 14.5 seconds asystole felt to be vasovagal in nature. Amiodaroneand BB d/c briefly due to bradycardia and ongoing A fib--as HR stable amiodarone resumed 10/18 and low dose BB resumed today.He continue to require oxygen per [place] due to and is noted to be limited by left hemiparesis with shuffling gait and SOB with activity. CIR recommended due to functional decline.  Patient transferred to CIR on 03/27/2019 .    Patient currently requires max with basic self-care skills secondary to muscle weakness, decreased cardiorespiratoy endurance and decreased oxygen support, decreased coordination, decreased attention, decreased awareness, decreased problem solving and decreased memory and  decreased standing balance and decreased balance strategies.  Prior to hospitalization, patient could complete ADLs with independent .  Patient will benefit from skilled intervention to increase independence with basic self-care skills prior to discharge home with care partner.  Anticipate patient will require 24 hour supervision and follow up home health.  OT - End of Session Activity Tolerance: Tolerates 30+ min activity with multiple rests Endurance Deficit: Yes Endurance Deficit Description: required frequent rest breaks OT Assessment Rehab Potential (ACUTE ONLY): Good OT Barriers to Discharge: Home environment access/layout;New oxygen OT Patient demonstrates impairments in the following area(s): Balance;Cognition;Endurance;Motor;Pain;Perception;Safety OT Basic ADL's Functional Problem(s): Grooming;Bathing;Dressing;Toileting OT Transfers Functional Problem(s): Toilet;Tub/Shower OT Additional Impairment(s): Fuctional Use of Upper Extremity OT Plan OT Intensity: Minimum of 1-2 x/day, 45 to 90 minutes OT Frequency: 5 out of 7 days OT Duration/Estimated Length of Stay: 10-12 days OT Treatment/Interventions: Balance/vestibular training;Cognitive remediation/compensation;Community reintegration;Discharge planning;Disease mangement/prevention;DME/adaptive equipment instruction;Functional mobility training;Neuromuscular re-education;Pain management;Patient/family education;Psychosocial support;Self Care/advanced ADL retraining;Skin care/wound managment;Therapeutic Activities;Therapeutic Exercise;UE/LE Strength taining/ROM;UE/LE Coordination activities OT Basic Self-Care Anticipated Outcome(s): Supervision OT Toileting Anticipated Outcome(s): Supervision OT Bathroom Transfers Anticipated Outcome(s): Supervision OT Recommendation Recommendations for Other Services: Neuropsych consult Patient destination: Home Follow Up Recommendations: Home health OT;24 hour supervision/assistance Equipment  Recommended: Tub/shower seat   Skilled Therapeutic Intervention OT eval completed with discussion of rehab process, OT purpose, POC, ELOS,  and goals.  ADL assessment completed with bathing and dressing at sit > stand at sink.  Pt required mod assist for sit > stand from elevated EOB and up to Max assist from lower w/c height due to sternal precautions.  Max assist for LB bathing and dressing due to sternal precautions and difficulty with threading pants over BLE due to weakness. Pt internally distracted throughout, requiring max cues for attention to task to complete bathing and dressing thoroughly.  Mod assist stand pivot transfers, with slow hesitant steps.  OT Evaluation Precautions/Restrictions  Precautions Precautions: Sternal;Fall Precaution Booklet Issued: No Precaution Comments: Lft hemiparesis Restrictions Other Position/Activity Restrictions: sternal precautions  General   Vital Signs Oxygen Therapy SpO2: 97 % O2 Device: Nasal Cannula O2 Flow Rate (L/min): 2 L/min Pain Pain Assessment Pain Scale: 0-10 Pain Score: 6  Faces Pain Scale: Hurts little more Pain Type: Chronic pain Pain Location: Ankle Pain Orientation: Right Pain Descriptors / Indicators: Aching Pain Frequency: Intermittent Pain Onset: On-going Patients Stated Pain Goal: 2 Pain Intervention(s): Medication (See eMAR);Repositioned;Cold applied Multiple Pain Sites: No Home Living/Prior Functioning Home Living Available Help at Discharge: Family, Available 24 hours/day Type of Home: House Home Access: Other (comment)(threshold) Home Layout: One level, Other (Comment)(den with 2 steps up, Lt side) Bathroom Shower/Tub: Walk-in shower, Door(has grab bars and hand held shower) Biochemist, clinical: Standard  Lives With: Spouse IADL History Homemaking Responsibilities: Yes Meal Prep Responsibility: Secondary Laundry Responsibility: Secondary Cleaning Responsibility: Secondary Bill Paying/Finance Responsibility:  Secondary Shopping Responsibility: No Current License: Yes Prior Function Level of Independence: Independent with basic ADLs, Independent with homemaking with ambulation, Independent with gait  Able to Take Stairs?: Yes Driving: Yes Vocation: Full time employment 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, driving  ADL ADL Grooming: Setup Where Assessed-Grooming: Sitting at sink Upper Body Bathing: Setup Where Assessed-Upper Body Bathing: Sitting at sink Lower Body Bathing: Maximal assistance Where Assessed-Lower Body Bathing: Sitting at sink, Standing at sink Upper Body Dressing: Minimal assistance Where Assessed-Upper Body Dressing: Sitting at sink Lower Body Dressing: Maximal assistance Where Assessed-Lower Body Dressing: Sitting at sink, Standing at sink Toilet Transfer: Moderate assistance Toilet Transfer Method: Stand pivot Science writer: Grab bars Vision Baseline Vision/History: Wears glasses Wears Glasses: Distance only Patient Visual Report: Blurring of vision(due for vision check) Vision Assessment?: Yes Eye Alignment: Within Functional Limits Ocular Range of Motion: Within Functional Limits Alignment/Gaze Preference: Within Defined Limits Tracking/Visual Pursuits: Able to track stimulus in all quads without difficulty Saccades: Within functional limits Visual Fields: No apparent deficits Cognition Overall Cognitive Status: Within Functional Limits for tasks assessed Arousal/Alertness: Awake/alert Orientation Level: Person;Place;Situation Person: Oriented Place: Oriented Situation: Oriented Year: 2020 Month: October Day of Week: Correct Memory: Impaired Memory Impairment: Retrieval deficit;Storage deficit Immediate Memory Recall: Sock;Blue;Bed Memory Recall Sock: Not able to recall("shoe" when given cue) Memory Recall Blue: Without Cue Memory Recall Bed: Without Cue Attention: Selective Selective Attention:  Impaired Selective Attention Impairment: Functional basic Awareness: Impaired Awareness Impairment: Anticipatory impairment Problem Solving: Impaired Problem Solving Impairment: Functional basic Safety/Judgment: Impaired Sensation Sensation Light Touch: Appears Intact Proprioception: Appears Intact Coordination Fine Motor Movements are Fluid and Coordinated: No Finger Nose Finger Test: mild discoordination Mobility  Transfers Sit to Stand: Moderate Assistance - Patient 50-74%  Extremity/Trunk Assessment RUE Assessment General Strength Comments: grossly 4+/5 LUE Assessment LUE Assessment: Exceptions to WFL(limited assessment due to sternal precautions) Active Range of Motion (AROM) Comments: shoulder flexion grossly 120* General Strength Comments: grossly 3+/5     Refer  to Care Plan for Long Term Goals  Recommendations for other services: Neuropsych   Discharge Criteria: Patient will be discharged from OT if patient refuses treatment 3 consecutive times without medical reason, if treatment goals not met, if there is a change in medical status, if patient makes no progress towards goals or if patient is discharged from hospital.  The above assessment, treatment plan, treatment alternatives and goals were discussed and mutually agreed upon: by patient  Simonne Come 03/28/2019, 10:49 AM

## 2019-03-28 NOTE — Evaluation (Signed)
Speech Language Pathology Assessment and Plan  Patient Details  Name: Martin [name] MRN: [id number] Date of Birth: [birth year]  SLP Diagnosis: Cognitive Impairments  Rehab Potential: Excellent ELOS: 10-12 days    Today's Date: 03/28/2019 SLP Individual Time: 1000-1100 SLP Individual Time Calculation (min): 60 min   Problem List:  Patient Active Problem List   Diagnosis Date Noted  . Debility 03/27/2019  . 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 Lethargy   . Persistent atrial fibrillation ([place])   . Cerebral embolism with cerebral infarction 03/13/2019  . S/P CABG x 3 03/10/2019  . NSTEMI (non-ST elevated myocardial infarction) ([place]) 03/07/2019  . Unstable angina ([place]) 03/07/2019  . OSA on CPAP 05/27/2016  . Obesity 06/10/2011  . Insomnia 06/10/2011  . PVC's (premature ventricular contractions)   . CAD (coronary artery disease)   . Carotid artery disease ([place])   . DEGENERATIVE JOINT DISEASE 07/28/2010  . Dyslipidemia 06/17/2010  . SUBSTANCE ABUSE, MULTIPLE 06/17/2010  . Essential hypertension 06/17/2010  . CAD 06/17/2010  . BRADYCARDIA 06/17/2010   Past Medical History:  Past Medical History:  Diagnosis Date  . Atrial fibrillation ([place])    a. dx 10/2017. b. s/p DCCV on 11/19/2017 with successful conversion to NSR but back in AFIB 3 weeks later  . CAD (coronary artery disease)    a. s/p PCI to LAD 2001. b. s/p stenting to the RCA and mid LAD December 2011, residual distal RCA disease treated medically. b. nuc 02/2013 abnormal with fixed defect seen in inferoapical, mid-inferior, and basal inferior walls, indicative of myocardial scar, no evidence of ischemia, EF 41% (Ef 55-60% by echo same time).  . Carotid artery disease ([place])    carotid Doppler course left side 5069% stenosis  . Dysrhythmia    AFib  . Habitual alcohol use   . Hyperlipidemia   . Hypertension   . Low back pain   . OSA on CPAP   . Osteoarthritis   .  Polysubstance abuse ([place])    use including marijuana and vicodin,which he obtained from the street  . PVC's (premature ventricular contractions)    status post Holter monitor normal sinus rhythm otherwise asymptomatic PVCs.  . Seasonal allergies    Past Surgical History:  Past Surgical History:  Procedure Laterality Date  . CARDIOVERSION N/A 11/19/2017   Procedure: CARDIOVERSION;  Surgeon: Herminio Commons, MD;  [facility];  Service: Cardiovascular;  Laterality: N/A;  . CORONARY ANGIOPLASTY WITH STENT PLACEMENT  2011  . CORONARY ARTERY BYPASS GRAFT N/A 03/10/2019   Procedure: CORONARY ARTERY BYPASS GRAFTING (CABG)X 3 Using Left Internal Mammary Artery and Right Saphenous Vein Grafts;  Surgeon: Ivin Poot, MD;  Location: [facility];  Service: Open Heart Surgery;  Laterality: N/A;  . LEFT HEART CATH AND CORONARY ANGIOGRAPHY N/A 03/08/2019   Procedure: LEFT HEART CATH AND CORONARY ANGIOGRAPHY;  Surgeon: Belva Crome, MD;  Location: [facility];  Service: Cardiovascular;  Laterality: N/A;  . TEE WITHOUT CARDIOVERSION N/A 03/10/2019   Procedure: TRANSESOPHAGEAL ECHOCARDIOGRAM (TEE);  Surgeon: Prescott Gum, Collier Salina, MD;  Location: [facility];  Service: Open Heart Surgery;  Laterality: N/A;  . TONSILLECTOMY      Assessment / Plan / Recommendation Clinical Impression   XHF:SFSEL Jerilynn Mages Vo is a 63 year old male with history of CAD/PAF-on Eliquis, OSA, who was admitted on 03/07/19 with CP and had mild elevation in troponins with subtle EKG changes concerning for NSTEMI. 2D echo  showed mild increase in LVH with basal inferior akinesis and EF 55-60%. He underwent cardiac cath revealing severe 3V CAD and underwent CABG X 3 by Dr. Prescott Gum on 03/11/19. He tolerated extubation with out difficulty but had confusion and was noted to have left sided weakness on 10/5 am. CT head done revealing small acute cortical distal R-ACA territory infarct and progressive small vessel disease. CTA  head/neck was negative for LVO, revealed new focus of calcification in R-ACA distal A2 segment which could indicated partially calcified thrombus, fetal origins PCA as well as incidental findings of large loculated component of left effusion and retrosternal 4.3X 2.0X 5.2 cm fluid collection favored to be seroma. He developed A fib with RVR as well as VT and was started on amiodarone on 10/5. Cardizem added for rate control and due to low BP maintained on milrinone due to fluid overload. Dr. Erlinda Hong recommends BP goal 110-140 to maintain adequate cerebral perfusion.  Follow up CT head without bleed therefore heparin added on 10/06 and transitioned to Eliquis by 10/08. EEG done due to somnolence and was negative for seizures. He has had issues with urinary retention requiring I/O caths and foley replaced. He did develop progressive leucocytosis with WBC- 20.1 on 10/13 thereforestarted ondoxycycline andCiproadded 10/15.UCS negative and CXR done revealing stable bilateral effusion snd stable nodular density left mid lung possibly hematoma at side of prior chest tube.Leucocytosis resolving but 10/15 pm, he did have unresponsive episode with seizure type episode and 14.5 seconds asystole felt to be vasovagal in nature. Amiodaroneand BB d/c briefly due to bradycardia and ongoing A fib--as HR stable amiodarone resumed 10/18 and low dose BB resumed today.He continue to require oxygen per [place] due to and is noted to be limited by left hemiparesis with shuffling gait and SOB with activity. Pt was admitted to CIR 03/27/19 and SLP evaluations were completed 03/28/19 with results as follows:  Clinical bedside swallow evaluation: Pt presents with oropharyngeal swallow function WFL and no treatment is indicated for swallowing at this time. No overt s/s aspiration were observed throughout several trials of thin liquid from cup and straw. Pt reports no difficult consuming pills whole with water, and denied presence of  globus sensation. Efficient mastication and oral clearance of regular textured solids. Continue regular heart healthy solid diet, thin liquids, meds whole with water, no need for supervision during meals.  Cognitive-linguistic evaluation: Pt presents with mild higher level cognitive deficits in the areas or organization, planning, selective attention, semi-complex problem solving, and short term memory. Pt scored a 25/30 on the [place], which is consistent with SLP's determination of mild deficits (based on both formal and informal assessment measures). Of note, pt was intermittently internally distracted and restless. Although he recalled 5/5 words without cues on short term recall task, he required 4 repetitions to initially store information. He also demonstrated decreased recall of new functional information such as his sternal precautions. Furthermore, SLP questions pt's safety and anticipatory awareness. He stated "getting out of bed" was only challenge he has noticed since  CABG and stroke.  Pt would benefit from skilled ST while inpatient; recommend ST provide treatment to address mild cognitive deficits secondary to CVA in order to maximize functional independence and reduce burden of care upon discharge.    Skilled Therapeutic Interventions          Clinical bedside swallow and cognitive-linguistic evaluations were completed and results were reviewed with pt. Pt and SLP participating in goal setting/determinig priorities for cognitive treatment.  SLP Assessment  Patient will need skilled Speech Lanaguage Pathology Services during CIR admission    Recommendations  SLP Diet Recommendations: Age appropriate regular solids;Thin Liquid Administration via: Cup;Straw Medication Administration: Whole meds with liquid Supervision: Patient able to self feed Compensations: Slow rate;Small sips/bites;Minimize environmental distractions Postural Changes and/or Swallow Maneuvers: Seated upright 90  degrees;Upright 30-60 min after meal Oral Care Recommendations: Oral care BID Patient destination: Home Follow up Recommendations: Other (comment)(TBD) Equipment Recommended: None recommended by SLP    SLP Frequency 3 to 5 out of 7 days   SLP Duration  SLP Intensity  SLP Treatment/Interventions 10-12 days  Minumum of 1-2 x/day, 30 to 90 minutes  Cognitive remediation/compensation;Cueing hierarchy;Functional tasks;Patient/family education;Therapeutic Activities;Environmental controls;Internal/external aids    Pain Pain Assessment Pain Scale: 0-10 Pain Score: 8  Pain Type: Chronic pain Pain Location: Ankle Pain Orientation: Right Pain Descriptors / Indicators: Aching Pain Frequency: Intermittent Pain Onset: On-going Patients Stated Pain Goal: 2 Pain Intervention(s): Other (Comment);Distraction(Pt reported RN already aware) Multiple Pain Sites: No     Short Term Goals: Week 1: SLP Short Term Goal 1 (Week 1): Pt will selectively attend to tasks for 15 minutes with Min A for redirection. SLP Short Term Goal 2 (Week 1): Pt will complete functional tasks with Min A for semi-complex problem solving. SLP Short Term Goal 3 (Week 1): Pt will verbally recall 2 safety and/or medical precautions related to deficits with Min A verbal cues.  Refer to Care Plan for Long Term Goals  Recommendations for other services: None   Discharge Criteria: Patient will be discharged from SLP if patient refuses treatment 3 consecutive times without medical reason, if treatment goals not met, if there is a change in medical status, if patient makes no progress towards goals or if patient is discharged from hospital.  The above assessment, treatment plan, treatment alternatives and goals were discussed and mutually agreed upon: by patient - no family available  Arbutus Leas 03/28/2019, 12:33 PM

## 2019-03-28 NOTE — Evaluation (Addendum)
Physical Therapy Assessment and Plan  Patient Details  Name: Martin [name] MRN: [id number] Date of Birth: Apr 22, 1956  PT Diagnosis: Abnormal posture, Abnormality of gait, Cognitive deficits, Coordination disorder, Difficulty walking, Dizziness and giddiness, Edema, Hemiplegia non-dominant, Hypotonia, Impaired cognition, Impaired sensation, Muscle weakness, Pain in joint and Vertigo Rehab Potential: Good ELOS: 12-14 days   Today's Date: 03/28/2019 PT Individual Time: 1305-1410 PT Individual Time Calculation (min): 65 min    Problem List:  Patient Active Problem List   Diagnosis Date Noted  . Debility 03/27/2019  . 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 Lethargy   . Persistent atrial fibrillation ([place])   . Cerebral embolism with cerebral infarction 03/13/2019  . S/P CABG x 3 03/10/2019  . NSTEMI (non-ST elevated myocardial infarction) ([place]) 03/07/2019  . Unstable angina ([place]) 03/07/2019  . OSA on CPAP 05/27/2016  . Obesity 06/10/2011  . Insomnia 06/10/2011  . PVC's (premature ventricular contractions)   . CAD (coronary artery disease)   . Carotid artery disease ([place])   . DEGENERATIVE JOINT DISEASE 07/28/2010  . Dyslipidemia 06/17/2010  . SUBSTANCE ABUSE, MULTIPLE 06/17/2010  . Essential hypertension 06/17/2010  . CAD 06/17/2010  . BRADYCARDIA 06/17/2010    Past Medical History:  Past Medical History:  Diagnosis Date  . Atrial fibrillation ([place])    a. dx 10/2017. b. s/p DCCV on 11/19/2017 with successful conversion to NSR but back in AFIB 3 weeks later  . CAD (coronary artery disease)    a. s/p PCI to LAD 2001. b. s/p stenting to the RCA and mid LAD December 2011, residual distal RCA disease treated medically. b. nuc 02/2013 abnormal with fixed defect seen in inferoapical, mid-inferior, and basal inferior walls, indicative of myocardial scar, no evidence of ischemia, EF 41% (Ef 55-60% by echo same time).  . Carotid artery  disease ([place])    carotid Doppler course left side 5069% stenosis  . Dysrhythmia    AFib  . Habitual alcohol use   . Hyperlipidemia   . Hypertension   . Low back pain   . OSA on CPAP   . Osteoarthritis   . Polysubstance abuse ([place])    use including marijuana and vicodin,which he obtained from the street  . PVC's (premature ventricular contractions)    status post Holter monitor normal sinus rhythm otherwise asymptomatic PVCs.  . Seasonal allergies    Past Surgical History:  Past Surgical History:  Procedure Laterality Date  . CARDIOVERSION N/A 11/19/2017   Procedure: CARDIOVERSION;  Surgeon: Herminio Commons, MD;  [facility];  Service: Cardiovascular;  Laterality: N/A;  . CORONARY ANGIOPLASTY WITH STENT PLACEMENT  2011  . CORONARY ARTERY BYPASS GRAFT N/A 03/10/2019   Procedure: CORONARY ARTERY BYPASS GRAFTING (CABG)X 3 Using Left Internal Mammary Artery and Right Saphenous Vein Grafts;  Surgeon: Ivin Poot, MD;  Location: [facility];  Service: Open Heart Surgery;  Laterality: N/A;  . LEFT HEART CATH AND CORONARY ANGIOGRAPHY N/A 03/08/2019   Procedure: LEFT HEART CATH AND CORONARY ANGIOGRAPHY;  Surgeon: Belva Crome, MD;  Location: [facility];  Service: Cardiovascular;  Laterality: N/A;  . TEE WITHOUT CARDIOVERSION N/A 03/10/2019   Procedure: TRANSESOPHAGEAL ECHOCARDIOGRAM (TEE);  Surgeon: Prescott Gum, Collier Salina, MD;  Location: [facility];  Service: Open Heart Surgery;  Laterality: N/A;  . TONSILLECTOMY      Assessment & Plan Clinical Impression: Patient is a 63 y.o. year old male  with history of CAD/PAF-on Eliquis, OSA,  who was admitted on 03/07/19 with CP and had mild elevation in troponins with subtle EKG changes concerning for NSTEMI. 2D echo showed mild increase in LVH with basal inferior akinesis and EF 55-60%. He underwent cardiac cath revealing severe 3V CAD and underwent CABG X 3 by Dr. Prescott Gum on 03/11/19. He tolerated extubation with out difficulty but had  confusion and was noted to have left sided weakness on 10/5 am. CT head done revealing small acute cortical distal R-ACA territory infarct and progressive small vessel disease. CTA head/neck was negative for LVO, revealed new focus of calcification in R-ACA distal A2 segment which could indicated partially calcified thrombus, fetal origins PCA as well as incidental findings of large loculated component of left effusion and retrosternal 4.3X 2.0X 5.2 cm fluid collection favored to be seroma. He developed A fib with RVR as well as VT and was started on amiodarone on 10/5. Cardizem added for rate control and due to low BP maintained on milrinone due to fluid overload. Dr. Erlinda Hong recommends BP goal 110-140 to maintain adequate cerebral perfusion.   Follow up CT head without bleed therefore heparin added on 10/06 and transitioned to Eliquis by 10/08. EEG done due to somnolence and was negative for seizures. He has had issues with urinary retention requiring I/O caths and foley replaced. He did develop progressive leucocytosis with WBC- 20.1 on 10/13 thereforestarted ondoxycycline andCiproadded 10/15.UCS negative and CXR done revealing stable bilateral effusion snd stable nodular density left mid lung possibly hematoma at side of prior chest tube.Leucocytosis resolving but 10/15 pm, he did have unresponsive episode with seizure type episode and 14.5 seconds asystole felt to be vasovagal in nature. Amiodaroneand BB d/c briefly due to bradycardia and ongoing A fib--as HR stable amiodarone resumed 10/18 and low dose BB resumed today.He continue to require oxygen per [place] due to and is noted to be limited by left hemiparesis with shuffling gait and SOB with activity. CIR recommended due to functional decline. Patient transferred to CIR on 03/27/2019 .   Patient currently requires max with mobility secondary to muscle weakness and muscle joint tightness, decreased cardiorespiratoy endurance and decreased oxygen  support, unbalanced muscle activation, decreased coordination and decreased motor planning, decreased attention, decreased awareness, decreased problem solving, decreased safety awareness and decreased memory and decreased sitting balance, decreased standing balance, decreased postural control, hemiplegia, decreased balance strategies and difficulty maintaining precautions.  Prior to hospitalization, patient was independent  with mobility and lived with Spouse in a House home.  Home access is  Level entry(threshold).  Patient will benefit from skilled PT intervention to maximize safe functional mobility, minimize fall risk and decrease caregiver burden for planned discharge home with 24 hour supervision.  Anticipate patient will benefit from follow up [place] at discharge.  PT - End of Session Activity Tolerance: Tolerates 30+ min activity with multiple rests Endurance Deficit: Yes Endurance Deficit Description: Requires frequenct rest breaks and has SOB with sitting activities PT Assessment Rehab Potential (ACUTE/IP ONLY): Good PT Barriers to Discharge: Decreased caregiver support PT Barriers to Discharge Comments: Patient's wife can only provide supervision-CGA at d/c, patient's son can assist PRN, but works during the day PT Patient demonstrates impairments in the following area(s): Balance;Edema;Endurance;Motor;Pain;Safety;Sensory;Skin Integrity PT Transfers Functional Problem(s): Bed Mobility;Bed to Chair;Car;Furniture;Floor PT Locomotion Functional Problem(s): Ambulation;Wheelchair Mobility;Stairs PT Plan PT Intensity: Minimum of 1-2 x/day ,45 to 90 minutes PT Frequency: 5 out of 7 days PT Duration Estimated Length of Stay: 12-14 days PT Treatment/Interventions: Ambulation/gait training;Discharge planning;Functional mobility training;Psychosocial support;Therapeutic  Activities;Balance/vestibular training;Disease management/prevention;Neuromuscular re-education;Skin care/wound  management;Therapeutic Exercise;Wheelchair propulsion/positioning;DME/adaptive equipment instruction;Cognitive remediation/compensation;Pain management;Splinting/orthotics;UE/LE Strength taining/ROM;Community reintegration;Functional electrical stimulation;Patient/family education;Stair training;UE/LE Coordination activities PT Transfers Anticipated Outcome(s): Supervision PT Locomotion Anticipated Outcome(s): CGA using LRAD 50' PT Recommendation Recommendations for Other Services: Neuropsych consult;Vestibular eval;Therapeutic Recreation consult Therapeutic Recreation Interventions: Stress management Follow Up Recommendations: Home health PT Patient destination: Home Equipment Recommended: To be determined  Skilled Therapeutic Intervention In addition to the PT evaluation below, the patient performed the following skilled PT interventions:  Patient in w/c upon PT arrival. Patient alert and agreeable to PT session. Patient reported 8/10 R anle pain during session, RN made aware and provided pain medicine during session. PT provided repositioning, rest breaks, and distraction as pain interventions throughout session. PT donned B TED hose due to increased B edema and applied 4" ACE wrap to R ankle for increased support/edema control. Also donned B tennis shoes with total A at beginning of session. Vitals: BP 147/83, HR 73, SPO2 98% on 2L at beginning of session.   Therapeutic Activity: Bed Mobility: Patient performed sit to supine with min A for LE management with cues for performing side lying to supine without UE use to adhere to sternal precautions, patient with minimal UE use despite cues. Patient unable to perform rolling this session due to fatigue and SOB in lying. SOB improved with HOB elevated. SPO2 92% on 2 L O2. Transfers: Patient attempted sit to/form stand and squat pivot transfers x2 with max A of 1 person. Patient unable to complete transfers safely due to difficulty to bringing trunk  forward to bring COG over his feet. He then performed a sit to/from stand in the [place] with max A +2 form the w/c and min A from stedy seat. He performed sit to/from stand from an elevated bed x1 with mod A of 1 person using a RW without UE use to maintain sternal precautions.  Patient with SOB in sitting, stated due to difficulty breathing with positioning in w/c. Reported increased SOB and fatigue this afternoon, limiting evaluation of functional mobility, SPO2 94% on 2L O2. Also with increased SOB with activity throughout session. RN made aware.   Patient in bed at end of session with breaks locked, bed alarm set, and all needs within reach. Provided patient with a higher w/c cushion for elevated seating in w/c during session.   Instructed pt in results of PT evaluation as detailed below, PT POC, rehab potential, rehab goals, and discharge recommendations. Additionally discussed CIR's policies regarding fall safety and use of chair alarm and/or quick release belt. Pt verbalized understanding and in agreement. Will update pt's family members as they become available.     PT Evaluation Precautions/Restrictions Precautions Precautions: Sternal;Fall Precaution Booklet Issued: No Precaution Comments: Lft hemiparesis Restrictions Other Position/Activity Restrictions: sternal precautions  Home Living/Prior Functioning Home Living Available Help at Discharge: Family Type of Home: House Home Access: Level entry(threshold) Home Layout: One level(2 steps to den)  Lives With: Spouse Prior Function Level of Independence: Independent with basic ADLs;Independent with homemaking with ambulation;Independent with gait  Able to Take Stairs?: Yes Driving: Yes Vocation: Full time employment Vocation Requirements: sheet metal work Vision/Perception  Vision - Risk analyst: Within Designer, television/film set Perception: Within Functional Limits Praxis Praxis: Intact  Cognition Overall  Cognitive Status: Within Functional Limits for tasks assessed Arousal/Alertness: Awake/alert Orientation Level: Oriented X4 Attention: Selective Selective Attention: Impaired Selective Attention Impairment: Verbal basic;Functional basic Memory: Impaired Memory Impairment: Storage deficit;Decreased recall of new information Awareness: Impaired Awareness  Impairment: Anticipatory impairment Problem Solving: Impaired Problem Solving Impairment: Verbal complex Executive Function: Writer: Impaired Organizing Impairment: Functional basic Behaviors: Restless Safety/Judgment: Impaired Sensation Sensation Light Touch: Impaired by gross assessment Light Touch Impaired Details: Impaired RLE;Impaired LLE(hx of peripheral neuropathy from forefoot to toes bilaterally at baseline) Proprioception: Appears Intact Coordination Gross Motor Movements are Fluid and Coordinated: No Fine Motor Movements are Fluid and Coordinated: No Coordination and Movement Description: L hemiperisis and decreased activity tolerance Finger Nose Finger Test: mild discoordination Motor  Motor Motor: Other (comment) Motor - Skilled Clinical Observations: L hemiperisis and decreased activity tolerance  Mobility Bed Mobility Bed Mobility: Sit to Supine Sit to Supine: Minimal Assistance - Patient > 75% Transfers Transfers: Sit to Stand;Stand to Sit;Stand Pivot Transfers Sit to Stand: Moderate Assistance - Patient 50-74%(from elevated surface) Stand to Sit: Moderate Assistance - Patient 50-74%(from elevated surface) Stand Pivot Transfers: Dependent - Patient 0%(with Optician, dispensing) Transfer (Assistive device): Comptroller via Lift Equipment: Probation officer Ambulation: No Gait Gait: No Stairs / Additional Locomotion Stairs: No Wheelchair Mobility Wheelchair Mobility: No(Sternal precautions)  Trunk/Postural Assessment  Cervical Assessment Cervical Assessment: Exceptions to WFL(forward  head) Thoracic Assessment Thoracic Assessment: Exceptions to WFL(rounded shoulders) Lumbar Assessment Lumbar Assessment: Exceptions to WFL(posterior pelvic tilt) Postural Control Postural Control: Deficits on evaluation(decreased/delaying/posterior lean)  Balance Balance Balance Assessed: Yes Static Sitting Balance Static Sitting - Balance Support: Bilateral upper extremity supported;Feet supported Static Sitting - Level of Assistance: 5: Stand by assistance Dynamic Sitting Balance Dynamic Sitting - Balance Support: Left upper extremity supported;Right upper extremity supported;Feet unsupported Dynamic Sitting - Level of Assistance: 4: Min assist Dynamic Sitting Balance - Compensations: intermittent L lateral lean, needed cues to correct to midline Dynamic Sitting - Balance Activities: Forward lean/weight shifting;Lateral lean/weight shifting;Reaching for objects Static Standing Balance Static Standing - Balance Support: Bilateral upper extremity supported;During functional activity Static Standing - Level of Assistance: 4: Min assist Extremity Assessment  RUE Assessment RUE Assessment: Exceptions to [facility] Active Range of Motion (AROM) Comments: WFL General Strength Comments: grossly 4+/5 LUE Assessment LUE Assessment: Exceptions to WFL(limited assessment due to sternal precautions) Active Range of Motion (AROM) Comments: shoulder flexion grossly 120* General Strength Comments: grossly 3+/5 RLE Assessment RLE Assessment: Exceptions to [facility] Active Range of Motion (AROM) Comments: limited anle DF/PF ROM, limited by pain General Strength Comments: Gossly 4+/5 except DF 4-/5 due to pain LLE Assessment LLE Assessment: Exceptions to [facility] Active Range of Motion (AROM) Comments: limited DF ROM and tight hamstrings General Strength Comments: Grossly in sitting: 2/5 hip flexion, 3+/5 knee extension/flexion, 0/5 DF, 1/5 PF    Refer to Care Plan for Long Term Goals  Recommendations for  other services: Neuropsych and Therapeutic Recreation  Stress management  Discharge Criteria: Patient will be discharged from PT if patient refuses treatment 3 consecutive times without medical reason, if treatment goals not met, if there is a change in medical status, if patient makes no progress towards goals or if patient is discharged from hospital.  The above assessment, treatment plan, treatment alternatives and goals were discussed and mutually agreed upon: by patient  Florrie Ramires L Anaja Monts PT, DPT  03/28/2019, 5:06 PM

## 2019-03-28 NOTE — IPOC Note (Addendum)
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 [facility]) Active Problems:   Debility     Functional Problem List: Nursing Bladder, Bowel, Edema, Safety, Pain, Nutrition, Medication Management, Skin Integrity  PT Balance, Edema, Endurance, Motor, Pain, Safety, Sensory, Skin Integrity  OT Balance, Cognition, Endurance, Motor, Pain, Perception, Safety  SLP Cognition, Safety  TR         Basic ADL's: OT Grooming, Bathing, Dressing, Toileting     Advanced  ADL's: OT       Transfers: PT Bed Mobility, Bed to Chair, Car, Furniture, Floor  OT Toilet, Metallurgist: PT Ambulation, Emergency planning/management officer, Stairs     Additional Impairments: OT Fuctional Use of Upper Extremity  SLP Social Cognition   Problem Solving, Memory, Attention, Awareness  TR      Anticipated Outcomes Item Anticipated Outcome  Self Feeding    Swallowing      Basic self-care  Supervision  Toileting  Supervision   Bathroom Transfers Supervision  Bowel/Bladder  min assist  Transfers  Supervision  Locomotion  CGA using LRAD 50'  Communication     Cognition  Supervision A-Mod I  Pain  less<3  Safety/Judgment      Therapy Plan: PT Intensity: Minimum of 1-2 x/day ,45 to 90 minutes PT Frequency: 5 out of 7 days PT Duration Estimated Length of Stay: 12-14 days OT Intensity: Minimum of 1-2 x/day, 45 to 90 minutes OT Frequency: 5 out of 7 days OT Duration/Estimated Length of Stay: 10-12 days SLP Intensity: Minumum of 1-2 x/day, 30 to 90 minutes SLP Frequency: 3 to 5 out of 7 days SLP Duration/Estimated Length of Stay: 10-12 days    Team Interventions: Nursing Interventions Disease Management/Prevention, Skin  Care/Wound Management, Bladder Management, Pain Management, Bowel Management, Medication Management  PT interventions Ambulation/gait training, Discharge planning, Functional mobility training, Psychosocial support, Therapeutic Activities, Balance/vestibular training, Disease management/prevention, Neuromuscular re-education, Skin care/wound management, Therapeutic Exercise, Wheelchair propulsion/positioning, DME/adaptive equipment instruction, Cognitive remediation/compensation, Pain management, Splinting/orthotics, UE/LE Strength taining/ROM, Community reintegration, Technical sales engineer stimulation, Patient/family education, IT trainer, UE/LE Coordination activities  OT Interventions Training and development officer, Cognitive remediation/compensation, Academic librarian, Discharge planning, Disease mangement/prevention, Engineer, drilling, Functional mobility training, Neuromuscular re-education, Pain management, Patient/family education, Psychosocial support, Self Care/advanced ADL retraining, Skin care/wound managment, Therapeutic Activities, Therapeutic Exercise, UE/LE Strength taining/ROM, UE/LE Coordination activities  SLP Interventions Cognitive remediation/compensation, Cueing hierarchy, Functional tasks, Patient/family education, Therapeutic Activities, Environmental controls, Internal/external aids  TR Interventions    SW/CM Interventions     Barriers to Discharge MD  Medical stability and Weight  Nursing      PT Decreased caregiver support Patient's wife can only provide supervision-CGA at d/c, patient's son can assist PRN, but works during the day  OT Tree surgeon, New oxygen    SLP      SW       Team Discharge Planning: Destination: PT-Home ,OT- Home , SLP-Home Projected Follow-up: PT-Home health PT, OT-  Home health OT, 24 hour supervision/assistance, SLP-Other (comment)(TBD) Projected Equipment Needs: PT-To be determined, OT- Tub/shower  seat, SLP-None recommended by SLP Equipment Details: PT- , OT-  Patient/family involved in discharge planning: PT- Patient,  OT-Patient, SLP-Patient  MD ELOS: 10-14 days. Medical Rehab Prognosis:  Good Assessment: 63 year old male with  history of CAD/PAF-on Eliquis, OSA, who was admitted on 03/07/19 with CP and had mild elevation in troponins with subtle EKG changes concerning for NSTEMI. 2D echo showed mild increase in LVH with basal inferior akinesis and EF 55-60%. He underwent cardiac cath revealing severe 3V CAD and underwent CABG X 3 by Dr. Prescott Gum on 03/11/19. He tolerated extubation with out difficulty but had confusion and was noted to have left sided weakness on 10/5 am. CT head done revealing small acute cortical distal R-ACA territory infarct and progressive small vessel disease. CTA head/neck was negative for LVO, revealed new focus of calcification in R-ACA distal A2 segment which could indicated partially calcified thrombus, fetal origins PCA as well as incidental findings of large loculated component of left effusion and retrosternal 4.3X 2.0X 5.2 cm fluid collection favored to be seroma. He developed A fib with RVR as well as VT and was started on amiodarone on 10/5. Cardizem added for rate control and due to low BP maintained on milrinone due to fluid overload. Dr. Erlinda Hong recommends BP goal 110-140 to maintain adequate cerebral perfusion. Follow up CT head without bleed therefore heparin added on 10/06 and transitioned to Eliquis by 10/08. EEG done due to somnolence and was negative for seizures. He has had issues with urinary retention requiring I/O caths and foley replaced. He did develop progressive leucocytosis with WBC- 20.1 on 10/13 thereforestarted ondoxycycline andCiproadded 10/15.UCS negative and CXR done revealing stable bilateral effusion snd stable nodular density left mid lung possibly hematoma at side of prior chest tube.Leucocytosis resolving but 10/15 pm, he did have  unresponsive episode with seizure type episode and 14.5 seconds asystole felt to be vasovagal in nature. Amiodaroneand BB d/c briefly due to bradycardia and ongoing A fib--as HR stable amiodarone resumed 10/18 and low dose BB resumed.He continue to require oxygen per [place] due to and is noted to be limited by left hemiparesis with SOB with activity.  We will set goals for Supervision with PT/OT and Supervision/Mod I with SLP.  Due to the current state of emergency, patients may not be receiving their 3-hours of Medicare-mandated therapy.  See Team Conference Notes for weekly updates to the plan of care

## 2019-03-28 NOTE — Plan of Care (Signed)
Nutrition Education Note  RD consulted for nutrition education regarding a Heart Healthy diet.  Spoke with pt at bedside. Pt reports good understanding of the heart healthy diet and states he follows its principles at home. Pt reports typically eating 3 meals daily.  Breakfast: oatmeal with honey and blueberries or raisin bran cereal with almond milk Lunch: salad with chicken or ACP from [employer]: meat, potato, vegetable  Pt reports that he does not add salt to his food but that his wife adds "too much" when she is cooking and he has to remind her.  Lipid Panel     Component Value Date/Time   CHOL 170 03/07/2019 2358   TRIG 218 (H) 03/07/2019 2358   HDL 41 03/07/2019 2358   CHOLHDL 4.1 03/07/2019 2358   VLDL 44 (H) 03/07/2019 2358   LDLCALC 85 03/07/2019 2358    RD provided "Heart Healthy Nutrition Therapy" handout from the Academy of [employer]. Reviewed patient's dietary recall. Provided examples on ways to decrease sodium and fat intake in diet. Discouraged intake of processed foods and use of salt shaker. Encouraged fresh fruits and vegetables as well as whole grain sources of carbohydrates to maximize fiber intake. Teach back method used.  Expect good compliance.  Body mass index is 41.23 kg/m. Pt meets criteria for obesity class III based on current BMI.  Current diet order is Heart Healthy/Carb Modified, patient is consuming approximately 100% of meals at this time. Labs and medications reviewed. No further nutrition interventions warranted at this time. RD contact information provided. If additional nutrition issues arise, please re-consult RD.   Gaynell Face, MS, RD, LDN Inpatient Clinical Dietitian Pager: [phone] Weekend/After Hours: [phone]

## 2019-03-29 ENCOUNTER — Inpatient Hospital Stay (HOSPITAL_COMMUNITY): Payer: Medicare Other

## 2019-03-29 ENCOUNTER — Inpatient Hospital Stay (HOSPITAL_COMMUNITY): Payer: Medicare Other | Admitting: Speech Pathology

## 2019-03-29 DIAGNOSIS — M109 Gout, unspecified: Secondary | ICD-10-CM

## 2019-03-29 DIAGNOSIS — R339 Retention of urine, unspecified: Secondary | ICD-10-CM

## 2019-03-29 DIAGNOSIS — I5032 Chronic diastolic (congestive) heart failure: Secondary | ICD-10-CM

## 2019-03-29 DIAGNOSIS — E46 Unspecified protein-calorie malnutrition: Secondary | ICD-10-CM

## 2019-03-29 DIAGNOSIS — G4733 Obstructive sleep apnea (adult) (pediatric): Secondary | ICD-10-CM

## 2019-03-29 DIAGNOSIS — E8809 Other disorders of plasma-protein metabolism, not elsewhere classified: Secondary | ICD-10-CM

## 2019-03-29 DIAGNOSIS — D62 Acute posthemorrhagic anemia: Secondary | ICD-10-CM

## 2019-03-29 MED ORDER — PRO-STAT SUGAR FREE PO LIQD
30.0000 mL | Freq: Two times a day (BID) | ORAL | Status: DC
  Administered 2019-03-29 – 2019-04-11 (×27): 30 mL via ORAL
  Filled 2019-03-29 (×27): qty 30

## 2019-03-29 MED ORDER — COLCHICINE 0.6 MG PO TABS
1.2000 mg | ORAL_TABLET | Freq: Once | ORAL | Status: AC
  Administered 2019-03-29: 1.2 mg via ORAL
  Filled 2019-03-29: qty 2

## 2019-03-29 MED ORDER — TAMSULOSIN HCL 0.4 MG PO CAPS
0.4000 mg | ORAL_CAPSULE | Freq: Two times a day (BID) | ORAL | Status: DC
  Administered 2019-03-29 – 2019-04-11 (×27): 0.4 mg via ORAL
  Filled 2019-03-29 (×28): qty 1

## 2019-03-29 MED ORDER — COLCHICINE 0.6 MG PO TABS
0.6000 mg | ORAL_TABLET | Freq: Every day | ORAL | Status: DC
  Administered 2019-03-29 – 2019-04-10 (×13): 0.6 mg via ORAL
  Filled 2019-03-29 (×13): qty 1

## 2019-03-29 NOTE — Progress Notes (Signed)
Occupational Therapy Session Note  Patient Details  Name: Martin [name] MRN: [id number] Date of Birth: [birth year]  Today's Date: 03/29/2019 OT Individual Time: 1345-1415 OT Individual Time Calculation (min): 30 min  and Today's Date: 03/29/2019 OT Missed Time: 15 Minutes Missed Time Reason: Nursing care   Short Term Goals: Week 1:  OT Short Term Goal 1 (Week 1): Pt will complete stand pivot transfers with LRAD with min assist OT Short Term Goal 2 (Week 1): Pt will complete toileting tasks with min assist OT Short Term Goal 3 (Week 1): Pt will complete bathing with min assist with AE as needed OT Short Term Goal 4 (Week 1): Pt will complete LB dressing with min assist with AE as needed OT Short Term Goal 5 (Week 1): Pt will complete 1 grooming activity in standing, with CGA for standing balance, to demonstrate increased standing tolerance  Skilled Therapeutic Interventions/Progress Updates:    Pt resting in bed upon arrival.  Pt requested to stand at EOB to urinate into urinal.  Pt stated he felt like he needed "to go." Pt required max A for supine>sit EOB  Sit<>stand from EOB (bed elevated) with CGA.  Pt followed sternal precautions without cues.  Pt stood while therapist positioned urinal. Pt unsuccessful.  Pt required tot A for clothing management. Pt requested to go up to go up to sink to brush teeth and wash hands.  Pt performed stand pivot tranfser with min A.  Pt completed grooming tasks seated in w/c.  RN and NT entered room for bladder scan.  Pt returned to bed with min A for stand pivot transfer.  Max A for sit>supine.  Pt remained in bed with RN and NT present.   Therapy Documentation Precautions:  Precautions Precautions: Sternal, Fall Precaution Booklet Issued: No Precaution Comments: Lft hemiparesis Restrictions Weight Bearing Restrictions: No Other Position/Activity Restrictions: sternal precautions  General: General OT Amount of Missed Time: 15 Minutes Pain: Pain  Assessment Pain Scale: 0-10 Pain Score: 8  Pain Type: Acute pain Pain Location: Shoulder Pain Orientation: Left Pain Descriptors / Indicators: Aching Pain Frequency: Occasional Pain Onset: On-going Patients Stated Pain Goal: 1 Pain Intervention(s): RN made aware Multiple Pain Sites: Yes 2nd Pain Site Pain Score: 5 Pain Type: Acute pain Pain Location: Ankle Pain Orientation: Right    Therapy/Group: Individual Therapy  Leroy Libman 03/29/2019, 2:34 PM

## 2019-03-29 NOTE — Progress Notes (Signed)
RT setup patients home CPAP at bedside and added sterile water to water chamber.

## 2019-03-29 NOTE — Progress Notes (Signed)
Speech Language Pathology Daily Session Note  Patient Details  Name: Martin [name] MRN: [id number] Date of Birth: 1955/08/09  Today's Date: 03/29/2019 SLP Individual Time: 1430-1506 SLP Individual Time Calculation (min): 36 min  Short Term Goals: Week 1: SLP Short Term Goal 1 (Week 1): Pt will selectively attend to tasks for 15 minutes with Min A for redirection. SLP Short Term Goal 2 (Week 1): Pt will complete functional tasks with Min A for semi-complex problem solving. SLP Short Term Goal 3 (Week 1): Pt will verbally recall 2 safety and/or medical precautions related to deficits with Min A verbal cues.  Skilled Therapeutic Interventions: Pt was seen for skilled ST targeting cognitive goals. SLP facilitated session with Mod faded to [place] A verbal and visual cues for organization and problem solving during a complex checkbook balancing task. Pt required increased levels of cueing in comparison to checkbook task targeted earlier today due to increased complexity of this activity. Of note, Min A verbal cues for sequencing and Mod tactile A provided as pt transferred from supine to sitting edge of bed and vice versa. Increased work of breathing noted during transfers, although pt denied SOB. Pt was left laying in bed with alarm set, needs within reach, and wife present. Continue per current plan of care.      Pain Pain Assessment Pain Scale: Faces Faces Pain Scale: No hurt  Therapy/Group: Individual Therapy  Arbutus Leas 03/29/2019, 3:12 PM

## 2019-03-29 NOTE — Progress Notes (Signed)
Speech Language Pathology Daily Session Note  Patient Details  Name: Martin [name] MRN: [id number] Date of Birth: Oct 10, 1955  Today's Date: 03/29/2019 SLP Individual Time: [id number] SLP Individual Time Calculation (min): 26 min  Short Term Goals: Week 1: SLP Short Term Goal 1 (Week 1): Pt will selectively attend to tasks for 15 minutes with Min A for redirection. SLP Short Term Goal 2 (Week 1): Pt will complete functional tasks with Min A for semi-complex problem solving. SLP Short Term Goal 3 (Week 1): Pt will verbally recall 2 safety and/or medical precautions related to deficits with Min A verbal cues.  Skilled Therapeutic Interventions: Pt was seen for skilled ST targeting cognitive goals. SLP facilitated session with Min A verbal cues for error awareness and semi-complex problem solving during a checkbook balancing activity. Supervision A verbal cues also provided for organization throughout task. Although pt verbally acknowledged he would benefit from allowing himself more time to complete tasks to prevent errors. Functional education provided regarding impact of impulsivity and decreased emergent awareness on safety, with concrete examples related to mobility. Pt left sitting in wheelchair with seatbelt alarm in tact and all needs within reach. Continue per current plan of care.      Pain Pain Assessment Pain Scale: 0-10 Pain Score: 8  Pain Type: Acute pain Pain Location: Shoulder Pain Orientation: Left Pain Descriptors / Indicators: Aching Pain Frequency: Occasional Pain Onset: On-going Patients Stated Pain Goal: 1 Pain Intervention(s): RN made aware Multiple Pain Sites: Yes 2nd Pain Site Pain Score: 5 Pain Type: Acute pain Pain Location: Ankle Pain Orientation: Right  Therapy/Group: Individual Therapy  Arbutus Leas 03/29/2019, 12:09 PM

## 2019-03-29 NOTE — Progress Notes (Addendum)
[facility] PHYSICAL MEDICINE & REHABILITATION PROGRESS NOTE   Subjective/Complaints: Patient seen laying in bed this AM.  He states he slept fairly overnight. He states he received his meds late.  He notes gout in his right ankle.   ROS: +SOB, right ankle pain. Denies CP, N/V/D  Objective:   No results found. Recent Labs    03/28/19 0512  WBC 6.4  HGB 10.3*  HCT 31.4*  PLT 241   Recent Labs    03/28/19 0512  NA 136  K 3.8  CL 100  CO2 27  GLUCOSE 110*  BUN 10  CREATININE 0.58*  CALCIUM 8.8*    Intake/Output Summary (Last 24 hours) at 03/29/2019 0857 Last data filed at 03/29/2019 [id number] Gross per 24 hour  Intake 342 ml  Output 3125 ml  Net -2783 ml     Physical Exam: Vital Signs Blood pressure (!) 142/87, pulse 60, temperature 97.8 F (36.6 C), resp. rate 18, weight 121.5 kg, SpO2 100 %. Constitutional: No distress . Vital signs reviewed. HENT: Normocephalic.  Atraumatic. Eyes: EOMI. No discharge. Cardiovascular: No JVD. Respiratory: Normal effort.  No stridor. +[place] GI: Non-distended. Skin: Healing abrasions Psych: Normal mood.  Normal behavior. Musculoskeletal: LE edema Right ankle TTP Neurological: Alert Motor: LUE 4-4+/5 proximal to distal.  LLE: 2-/5 HF, 2-/5KE and 0/5 ADF RUE: 5/5 proximal to distal  RLE: 4-4+/5 proximal to distal.   Assessment/Plan: 1. Functional deficits secondary to right ACA infarct which require 3+ hours per day of interdisciplinary therapy in a comprehensive inpatient rehab setting.  Physiatrist is providing close team supervision and 24 hour management of active medical problems listed below.  Physiatrist and rehab team continue to assess barriers to discharge/monitor patient progress toward functional and medical goals  Care Tool:  Bathing    Body parts bathed by patient: Right arm, Left arm, Chest, Abdomen, Face   Body parts bathed by helper: Front perineal area, Buttocks, Right upper leg, Left upper leg, Right  lower leg, Left lower leg     Bathing assist Assist Level: Maximal Assistance - Patient 24 - 49%     Upper Body Dressing/Undressing Upper body dressing   What is the patient wearing?: Pull over shirt    Upper body assist Assist Level: Minimal Assistance - Patient > 75%    Lower Body Dressing/Undressing Lower body dressing      What is the patient wearing?: Pants, Incontinence brief     Lower body assist Assist for lower body dressing: Total Assistance - Patient < 25%     Toileting Toileting    Toileting assist Assist for toileting: Total Assistance - Patient < 25%(I/O cath)     Transfers Chair/bed transfer  Transfers assist     Chair/bed transfer assist level: Dependent - Patient 0% Chair/bed transfer assistive device: Other(Stedy)   Locomotion Ambulation   Ambulation assist   Ambulation activity did not occur: Safety/medical concerns(fatigue/SOB/R ankle pain)          Walk 10 feet activity   Assist  Walk 10 feet activity did not occur: Safety/medical concerns(fatigue/SOB/R ankle pain)        Walk 50 feet activity   Assist Walk 50 feet with 2 turns activity did not occur: Safety/medical concerns(fatigue/SOB/R ankle pain)         Walk 150 feet activity   Assist Walk 150 feet activity did not occur: Safety/medical concerns(fatigue/SOB/R ankle pain)         Walk 10 feet on uneven surface  activity  Assist Walk 10 feet on uneven surfaces activity did not occur: Safety/medical concerns(fatigue/SOB/R ankle pain)         Wheelchair     Assist     Wheelchair activity did not occur: Safety/medical concerns(Sternal precautions)         Wheelchair 50 feet with 2 turns activity    Assist    Wheelchair 50 feet with 2 turns activity did not occur: Safety/medical concerns(Sternal precautions)       Wheelchair 150 feet activity     Assist  Wheelchair 150 feet activity did not occur: Safety/medical concerns(Sternal  precautions)       Blood pressure (!) 142/87, pulse 60, temperature 97.8 F (36.6 C), resp. rate 18, weight 121.5 kg, SpO2 100 %.  Medical Problem List and Plan: 1.Left hemiparesis and decreased mobilitysecondary to right ACA infarct after CABG  Continue CIR PRAFO LLE  Team conference today to discuss current and goals and coordination of care, home and environmental barriers, and discharge planning with nursing, case manager, and therapies.  2. Antithrombotics: -DVT/anticoagulation:Pharmaceutical:Other (comment)--Eliquis -antiplatelet therapy: On low dose ASA. 3. Pain Management:Oxycodone prn 4. Mood:LCSW to follow for evaluation and support. -antipsychotic agents: N/A 5. Neuropsych: This patientiscapable of making decisions onhisown behalf. 6. Skin/Wound Care:Monitor wound for healing. Continue protein supplement to help promote healing. PRAFO LLE 7. Fluids/Electrolytes/Nutrition:encourage PO 8. CAD s/p CABG:Continue sternal precautions. ON ASA and Lipitor. 9. HTN:BP goal 110-140 to allow for adequate cerebral perfusion.  Relatively controlled on 10/21 10 Leukocytosis:Resolved  11.Diastolic heart failure: Strict I/O and daily weights. On Lasix daily with low dose BB.   Filed Weights   03/28/19 0500 03/29/19 0757  Weight: 123 kg 121.5 kg    ?Reliability on 10/21 12. Abnormal LFTs: WNL 10/20  13. Malnutrition: Pre-albumin- 14.1 14. A fib: Monitor for recurrent bradycardia--on Eliquis bid and back on amiodarone and metoprolol daily 15. Persistent hyponatremia:   Sodium 136 on 10/20 16. OSA/Hypoxia: PFTs with moderate to severe obstructive airway disease--now on Dulera bid.   CPAP   Wean supplemental oxygen as tolerated 17. Urinary retention:DC'ed foley---> bladder training.   Ua negative, ucx no growth  Flomax increased on 10/21 18. Acute Blood Loss Anemia  Hb 10.3 on 10/20  19.  Hypoalbuminemia  Supplement initiated on 10/21 20. Acute Gout Flare  Cont allopurinol   Colchicine started on 10/21  LOS: 2 days A FACE TO FACE EVALUATION WAS PERFORMED  Martin [name] Martin [name] 03/29/2019, 8:57 AM

## 2019-03-29 NOTE — Progress Notes (Signed)
Occupational Therapy Session Note  Patient Details  Name: Martin [name] MRN: [id number] Date of Birth: 1956-04-24  Today's Date: 03/29/2019 OT Individual Time: 0700-0800 OT Individual Time Calculation (min): 60 min    Short Term Goals: Week 1:  OT Short Term Goal 1 (Week 1): Pt will complete stand pivot transfers with LRAD with min assist OT Short Term Goal 2 (Week 1): Pt will complete toileting tasks with min assist OT Short Term Goal 3 (Week 1): Pt will complete bathing with min assist with AE as needed OT Short Term Goal 4 (Week 1): Pt will complete LB dressing with min assist with AE as needed OT Short Term Goal 5 (Week 1): Pt will complete 1 grooming activity in standing, with CGA for standing balance, to demonstrate increased standing tolerance  Skilled Therapeutic Interventions/Progress Updates:    OT intervention with focus on BADL retraining, activity tolerance, safety awareness, sit<>stand, functional transfers, and standing balance to increase independence with BADLs. Pt sitting EOB eating breakfast upon arrival.  Pt completed bathing/dressing tasks with sit<>stand from EOB (min A for sit<>stand). Pt transferred to w/c with stand pivot transfer X 2-mod A to R and min A to L. Pt required max A for LB bathing/dressing tasks. Pt recalls sternal precautions but requires min verbal cues for adherence during functional tasks. Pt returned to bed at end of session.  Max A for sit>supine. Pt remained in bed with bed alarm activated and all needs within reach.   Therapy Documentation Precautions:  Precautions Precautions: Sternal, Fall Precaution Booklet Issued: No Precaution Comments: Lft hemiparesis Restrictions Other Position/Activity Restrictions: sternal precautions  Pain:  Pt c/o "gout" R foot; MD aware   Therapy/Group: Individual Therapy  Leroy Libman 03/29/2019, 10:18 AM

## 2019-03-29 NOTE — Progress Notes (Addendum)
Physical Therapy Session Note  Patient Details  Name: [name] MRN: [id number] Date of Birth: 1955-10-10  Today's Date: 03/29/2019 PT Individual Time: 0915-1002 Individual Time Calculation: 47 min  Short Term Goals: Week 1:  PT Short Term Goal 1 (Week 1): Patient will perform bed mobility with min A adhearing to sternal precautions. PT Short Term Goal 2 (Week 1): Patient will perform basic transfers with mod A using LRAD adhearing to sternal precautions. PT Short Term Goal 3 (Week 1): Patient will initiate gait training. PT Short Term Goal 4 (Week 1): Patient will perform dynamic standing balance activities with min A using LRAD.  Skilled Therapeutic Interventions/Progress Updates:     Patient in bed on 2L O2 upon PT arrival. Patient alert and agreeable to PT session, remained on 2L O2 throughout session. Patient reported 4/10 in lying and 8/10 in standing R ankle pain during session, patient reported he was pre-medicated prior to session. PT provided repositioning, rest breaks, and distraction as pain interventions throughout session. PT provided a higher 20x18 w/c during session for improved sitting posture and tolerance. Patient reported that his chair was much better for him during session.   Therapeutic Activity: Bed Mobility: Patient performed supine to sit with max A for trunk support without use of UEs to maintain sternal precautions in a flat bed without use of rails. Provided verbal cues for performing log rolling, turning his head to look down, and bringing his knees to his chest to activate his core with sitting up. Transfers: Patient performed sit to/from stand x1, stand pivot x1, and a simulated car transfer performing a stand pivot x1 with min A. Provided verbal cues for rocking for momentum to stand and leaning far forward to shift his COG over his feet without use of UEs to maintain sternal precautions.  Gait Training:  Patient ambulated 12 feet using RW with min  A-CGA. Ambulated with decreased weight shift to the R, decreased L foot clearance and step length, and antalgic gait on R. Provided verbal cues for increased weight shift R to increased L foot clearance for safety.  Patient in w/c at end of session with breaks locked, seat belt alarm set, and all needs within reach. SPO2 >90% throughout session, patient with SOB following all activity, however, slightly  improved from yesterday   Therapy Documentation Precautions:  Precautions Precautions: Sternal, Fall Precaution Booklet Issued: No Precaution Comments: Lft hemiparesis Restrictions Weight Bearing Restrictions: No Other Position/Activity Restrictions: sternal precautions     Therapy/Group: Individual Therapy  Miracle Mongillo L Birda Didonato PT, DPT  03/29/2019, 4:14 PM

## 2019-03-30 ENCOUNTER — Inpatient Hospital Stay (HOSPITAL_COMMUNITY): Payer: Medicare Other | Admitting: Speech Pathology

## 2019-03-30 ENCOUNTER — Inpatient Hospital Stay (HOSPITAL_COMMUNITY): Payer: Medicare Other

## 2019-03-30 DIAGNOSIS — M10071 Idiopathic gout, right ankle and foot: Secondary | ICD-10-CM

## 2019-03-30 MED ORDER — ACETAMINOPHEN 325 MG PO TABS: 325.0000 mg | ORAL_TABLET | ORAL | Status: DC | PRN

## 2019-03-30 NOTE — Progress Notes (Signed)
Occupational Therapy Session Note  Patient Details  Name: Martin [name] MRN: [id number] Date of Birth: 29-May-1956  Today's Date: 03/30/2019 OT Individual Time: 1015-1045 OT Individual Time Calculation (min): 30 min    Short Term Goals: Week 1:  OT Short Term Goal 1 (Week 1): Pt will complete stand pivot transfers with LRAD with min assist OT Short Term Goal 2 (Week 1): Pt will complete toileting tasks with min assist OT Short Term Goal 3 (Week 1): Pt will complete bathing with min assist with AE as needed OT Short Term Goal 4 (Week 1): Pt will complete LB dressing with min assist with AE as needed OT Short Term Goal 5 (Week 1): Pt will complete 1 grooming activity in standing, with CGA for standing balance, to demonstrate increased standing tolerance  Skilled Therapeutic Interventions/Progress Updates:    OT intervention with focus on bed mobility and activity tolerance to increase independence with BADLsand functional transfers. Pt requires max A for repositioning in bed.  Pt requires max A for sit>supine in bed. Pt's body habitus and sternal precautions are barriers at this time.  Pt fatigues quickly and requires multiple rest breaks. Pt remained in bed with all needs within reach and bed alarm activated.   Therapy Documentation Precautions:  Precautions Precautions: Sternal, Fall Precaution Booklet Issued: No Precaution Comments: Lft hemiparesis Restrictions Weight Bearing Restrictions: (P) No Other Position/Activity Restrictions: sternal precautions  Pain:  Pt with no c/o pain   Therapy/Group: Individual Therapy  Leroy Libman 03/30/2019, 10:47 AM

## 2019-03-30 NOTE — Progress Notes (Signed)
Speech Language Pathology Daily Session Note  Patient Details  Name: Martin [name] MRN: [id number] Date of Birth: 18-Feb-1956  Today's Date: 03/30/2019 SLP Individual Time: [id number] SLP Individual Time Calculation (min): 45 min  Short Term Goals: Week 1: SLP Short Term Goal 1 (Week 1): Pt will selectively attend to tasks for 15 minutes with Min A for redirection. SLP Short Term Goal 2 (Week 1): Pt will complete functional tasks with Min A for semi-complex problem solving. SLP Short Term Goal 3 (Week 1): Pt will verbally recall 2 safety and/or medical precautions related to deficits with Min A verbal cues.  Skilled Therapeutic Interventions: Pt was seen for skilled ST targeting cognitive goals. SLP facilitated session with Mod A verbal and visual cues for organization and planning, Min A for problem solving during a complex daily scheduling task. In functional conversation, pt also verbally recalled general home medication regimen (including medication names, functions, and dosages) with ~75% accuracy, given Min A question cues. Pt was more distractible today than previous sessions, requiring Min A for redirection. Pt was left sitting in wheelchair with seatbelt alarm engaged, needs within reach, and wife present. Continue per current plan of care.   Pain Pain Assessment Pain Score: 0-No pain  Therapy/Group: Individual Therapy  Martin [name] 03/30/2019, 3:14 PM

## 2019-03-30 NOTE — Progress Notes (Signed)
Occupational Therapy Session Note  Patient Details  Name: Martin [name] MRN: [id number] Date of Birth: [birth year]  Today's Date: 03/30/2019 OT Individual Time: [id number] OT Individual Time Calculation (min): 75 min    Short Term Goals: Week 1:  OT Short Term Goal 1 (Week 1): Pt will complete stand pivot transfers with LRAD with min assist OT Short Term Goal 2 (Week 1): Pt will complete toileting tasks with min assist OT Short Term Goal 3 (Week 1): Pt will complete bathing with min assist with AE as needed OT Short Term Goal 4 (Week 1): Pt will complete LB dressing with min assist with AE as needed OT Short Term Goal 5 (Week 1): Pt will complete 1 grooming activity in standing, with CGA for standing balance, to demonstrate increased standing tolerance  Skilled Therapeutic Interventions/Progress Updates:    Pt seated EOB upon arrival and agreeable to transferring to w/c and cleaning up/changing clothing with sit<>stand at sink.  Sit>stand from EOB and stand pivot transfer with RW at min A. Pt performed sit<>stand X 6 during ADL session with min A. Pt adhering to sternal precautions with all sit<>stand.  Pt fatigues quickly with standing and requires multiple rest breaks during session.  Pt SOB with minimal activity.  O2>90% on 1L O2. Pt assisted with pulling pants over hips after threading with use of reacher.  Pt attempted to use personal sock aid but unsuccessful. Pt requires more than a reasonable amount of time to complete tasks with multiple rest breaks.  Pt remained seated in w/c with belt alarm activated and all needs within reach.    Therapy Documentation Precautions:  Precautions Precautions: Sternal, Fall Precaution Booklet Issued: No Precaution Comments: Lft hemiparesis Restrictions Weight Bearing Restrictions: (P) No Other Position/Activity Restrictions: sternal precautions  Pain:  Pt with no c/o pain this morning  Therapy/Group: Individual Therapy  Leroy Libman 03/30/2019, 8:16 AM

## 2019-03-30 NOTE — Progress Notes (Signed)
Physical Therapy Session Note  Patient Details  Name: Martin [name] MRN: [id number] Date of Birth: 1955-12-21  Today's Date: 03/30/2019 PT Individual Time: 1500-1531 PT Individual Time Calculation (min): 31 min   Short Term Goals: Week 1:  PT Short Term Goal 1 (Week 1): Patient will perform bed mobility with min A adhearing to sternal precautions. PT Short Term Goal 2 (Week 1): Patient will perform basic transfers with mod A using LRAD adhearing to sternal precautions. PT Short Term Goal 3 (Week 1): Patient will initiate gait training. PT Short Term Goal 4 (Week 1): Patient will perform dynamic standing balance activities with min A using LRAD.  Skilled Therapeutic Interventions/Progress Updates:   Pt reporting not feeling great today. Reports increased fatigue and "jittery" - reports RN was aware. Vitals WFL and monitored throughout. Activity performed to tolerance. PT donned shoes with total assist for time mangement prior to leaving room. Functional transfers with RW througout session with CGA/min assist due to posterior bias with cues for technique and facilitation for anterior weightshift. Pt adheres to sternal precautions without cues during transfers. Blocked practice sit <> stands from EOM with CGA/min assist x 5 reps for functional LE strengthening and balance/postural control retraining during transitional movements. Seated LE therex including LAQ during rest breaks x 10 reps each. Functional gait training with RW x 20' with min assist with cues for upright posture and noted decreased L foot clearance especially as fatiguing (noted during transfers at end also). Pt states he feels like is "done" for today and request to return to bed. Min assist for short distance gait back to bed with RW and transferred to supine with min assist for LLE management. Pt missed 14 min of skilled PT due to fatigue. RN in room upon exiting.   Sats remained WFL but pt with increased work of breathing  during minimal activity. Cues for pursed lip and diaphragmatic breathing throughout session to slow pace.   Therapy Documentation Precautions:  Precautions Precautions: Sternal, Fall Precaution Booklet Issued: No Precaution Comments: Lft hemiparesis Restrictions Weight Bearing Restrictions: No Other Position/Activity Restrictions: sternal precautions  General: PT Amount of Missed Time (min): 14 Minutes PT Missed Treatment Reason: Patient fatigue Vital Signs: Oxygen Therapy SpO2: 92-99 % O2 Device: Nasal Cannula (2L) Pain: Pain Assessment Pain Score: 0-No pain    Therapy/Group: Individual Therapy  [name], PT, DPT, CBIS  03/30/2019, 3:37 PM

## 2019-03-30 NOTE — Progress Notes (Signed)
[facility] PHYSICAL MEDICINE & REHABILITATION PROGRESS NOTE   Subjective/Complaints: Patient seen sitting up in a chair this morning.  He states he slept well overnight.  He notes improvement in right ankle pain.  ROS: +SOB. Denies CP, N/V/D  Objective:   No results found. Recent Labs    03/28/19 0512  WBC 6.4  HGB 10.3*  HCT 31.4*  PLT 241   Recent Labs    03/28/19 0512  NA 136  K 3.8  CL 100  CO2 27  GLUCOSE 110*  BUN 10  CREATININE 0.58*  CALCIUM 8.8*    Intake/Output Summary (Last 24 hours) at 03/30/2019 0851 Last data filed at 03/30/2019 0834 Gross per 24 hour  Intake 736 ml  Output 1100 ml  Net -364 ml     Physical Exam: Vital Signs Blood pressure (!) 145/75, pulse 63, temperature 98.2 F (36.8 C), temperature source Oral, resp. rate 18, weight 121.5 kg, SpO2 97 %. Constitutional: No distress . Vital signs reviewed. HENT: Normocephalic.  Atraumatic. Eyes: EOMI. No discharge. Cardiovascular: No JVD. Respiratory: Normal effort.  No stridor.  + [place] GI: Non-distended. Skin: Healing abrasions. Psych: Normal mood.  Normal behavior. Musc: LE edema Right ankle TTP, improving Neurological: Alert Motor: LUE 4-4+/5 proximal to distal.  LLE: 3-/5 HF, 3/5KE and 0/5 ADF RUE: 5/5 proximal to distal, unchanged  RLE: 4-4+/5 proximal to distal.   Assessment/Plan: 1. Functional deficits secondary to right ACA infarct which require 3+ hours per day of interdisciplinary therapy in a comprehensive inpatient rehab setting.  Physiatrist is providing close team supervision and 24 hour management of active medical problems listed below.  Physiatrist and rehab team continue to assess barriers to discharge/monitor patient progress toward functional and medical goals  Care Tool:  Bathing    Body parts bathed by patient: Right arm, Left arm, Chest, Abdomen, Front perineal area, Right upper leg, Left upper leg, Face   Body parts bathed by helper: Buttocks, Right lower  leg, Left lower leg     Bathing assist Assist Level: Moderate Assistance - Patient 50 - 74%     Upper Body Dressing/Undressing Upper body dressing   What is the patient wearing?: Pull over shirt    Upper body assist Assist Level: Supervision/Verbal cueing    Lower Body Dressing/Undressing Lower body dressing      What is the patient wearing?: Pants     Lower body assist Assist for lower body dressing: Minimal Assistance - Patient > 75%     Toileting Toileting    Toileting assist Assist for toileting: Maximal Assistance - Patient 25 - 49%     Transfers Chair/bed transfer  Transfers assist     Chair/bed transfer assist level: Minimal Assistance - Patient > 75% Chair/bed transfer assistive device: Programmer, multimedia   Ambulation assist   Ambulation activity did not occur: Safety/medical concerns(fatigue/SOB/R ankle pain)  Assist level: Minimal Assistance - Patient > 75% Assistive device: Walker-rolling Max distance: 12'   Walk 10 feet activity   Assist  Walk 10 feet activity did not occur: Safety/medical concerns(fatigue/SOB/R ankle pain)  Assist level: Minimal Assistance - Patient > 75% Assistive device: Walker-rolling   Walk 50 feet activity   Assist Walk 50 feet with 2 turns activity did not occur: Safety/medical concerns(R ankle pain and decreased activity tolerance)         Walk 150 feet activity   Assist Walk 150 feet activity did not occur: Safety/medical concerns(R ankle pain and decreased activity tolerance)  Walk 10 feet on uneven surface  activity   Assist Walk 10 feet on uneven surfaces activity did not occur: Safety/medical concerns(R ankle pain and decreased activity tolerance)         Wheelchair     Assist     Wheelchair activity did not occur: Safety/medical concerns(Sternal precautions)         Wheelchair 50 feet with 2 turns activity    Assist    Wheelchair 50 feet with 2 turns  activity did not occur: Safety/medical concerns(Sternal precautions)       Wheelchair 150 feet activity     Assist  Wheelchair 150 feet activity did not occur: Safety/medical concerns(Sternal precautions)       Blood pressure (!) 145/75, pulse 63, temperature 98.2 F (36.8 C), temperature source Oral, resp. rate 18, weight 121.5 kg, SpO2 97 %.  Medical Problem List and Plan: 1.Left hemiparesis and decreased mobilitysecondary to right ACA infarct after CABG  Cont CIR PRAFO LLE 2. Antithrombotics: -DVT/anticoagulation:Pharmaceutical:Other (comment)--Eliquis -antiplatelet therapy: On low dose ASA. 3. Pain Management:Oxycodone prn 4. Mood:LCSW to follow for evaluation and support. -antipsychotic agents: N/A 5. Neuropsych: This patientiscapable of making decisions onhisown behalf. 6. Skin/Wound Care:Monitor wound for healing. Continue protein supplement to help promote healing. PRAFO LLE 7. Fluids/Electrolytes/Nutrition:encourage PO 8. CAD s/p CABG:Continue sternal precautions. ON ASA and Lipitor. 9. HTN:BP goal 110-140 to allow for adequate cerebral perfusion.  Relatively controlled on 10/22 10 Leukocytosis:Resolved  11.Diastolic heart failure: Strict I/O and daily weights. On Lasix daily with low dose BB.   Filed Weights   03/28/19 0500 03/29/19 0757  Weight: 123 kg 121.5 kg    ?Reliability on 10/21, no weights this AM 12. Abnormal LFTs: WNL 10/20  13. Malnutrition: Pre-albumin- 14.1 14. A fib: Monitor for recurrent bradycardia--on Eliquis bid and back on amiodarone and metoprolol daily 15. Persistent hyponatremia:   Sodium 136 on 10/20 16. OSA/Hypoxia: PFTs with moderate to severe obstructive airway disease--now on Dulera bid.   CPAP   Wean supplemental oxygen as tolerated, continues to require 17. Urinary retention:DC'ed foley---> bladder training.   Ua negative, ucx no growth  Flomax  increased on 10/21 18. Acute Blood Loss Anemia  Hb 10.3 on 10/20 19. Hypoalbuminemia  Supplement initiated on 10/21 20. Acute Gout Flare  Cont allopurinol   Colchicine started on 10/21  Improving  LOS: 3 days A FACE TO FACE EVALUATION WAS PERFORMED   [name] 03/30/2019, 8:51 AM

## 2019-03-31 ENCOUNTER — Inpatient Hospital Stay (HOSPITAL_COMMUNITY): Payer: Medicare Other | Admitting: Occupational Therapy

## 2019-03-31 ENCOUNTER — Inpatient Hospital Stay (HOSPITAL_COMMUNITY): Payer: Medicare Other | Admitting: Speech Pathology

## 2019-03-31 ENCOUNTER — Inpatient Hospital Stay (HOSPITAL_COMMUNITY): Payer: Medicare Other

## 2019-03-31 DIAGNOSIS — E871 Hypo-osmolality and hyponatremia: Secondary | ICD-10-CM

## 2019-03-31 DIAGNOSIS — R600 Localized edema: Secondary | ICD-10-CM

## 2019-03-31 DIAGNOSIS — R0989 Other specified symptoms and signs involving the circulatory and respiratory systems: Secondary | ICD-10-CM

## 2019-03-31 DIAGNOSIS — R609 Edema, unspecified: Secondary | ICD-10-CM

## 2019-03-31 MED ORDER — FUROSEMIDE 20 MG PO TABS
20.0000 mg | ORAL_TABLET | Freq: Every day | ORAL | Status: DC
  Administered 2019-03-31 – 2019-04-02 (×3): 20 mg via ORAL
  Filled 2019-03-31 (×3): qty 1

## 2019-03-31 MED ORDER — POTASSIUM CHLORIDE CRYS ER 20 MEQ PO TBCR
20.0000 meq | EXTENDED_RELEASE_TABLET | Freq: Once | ORAL | Status: AC
  Administered 2019-03-31: 20 meq via ORAL
  Filled 2019-03-31: qty 1

## 2019-03-31 MED ORDER — FUROSEMIDE 10 MG/ML IJ SOLN
40.0000 mg | Freq: Once | INTRAMUSCULAR | Status: AC
  Administered 2019-03-31: 40 mg via INTRAVENOUS
  Filled 2019-03-31: qty 4

## 2019-03-31 NOTE — Progress Notes (Signed)
Social Work Assessment and Plan   Patient Details  Name: Martin [name] MRN: [id number] Date of Birth: January 05, 1956  Today's Date: 03/29/2019  Problem List:  Patient Active Problem List   Diagnosis Date Noted  . Hyponatremia   . Labile blood pressure   . Peripheral edema   . Morbid obesity ([place])   . Acute gout of right ankle   . Hypoalbuminemia due to protein-calorie malnutrition ([place])   . Acute blood loss anemia   . Urinary retention   . OSA (obstructive sleep apnea)   . Chronic diastolic congestive heart failure ([place])   . Debility 03/27/2019  . 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 ([place]) 03/27/2019  . Lethargy   . Persistent atrial fibrillation ([place])   . Cerebral embolism with cerebral infarction 03/13/2019  . S/P CABG x 3 03/10/2019  . NSTEMI (non-ST elevated myocardial infarction) ([place]) 03/07/2019  . Unstable angina ([place]) 03/07/2019  . OSA on CPAP 05/27/2016  . Obesity 06/10/2011  . Insomnia 06/10/2011  . PVC's (premature ventricular contractions)   . CAD (coronary artery disease)   . Carotid artery disease ([place])   . DEGENERATIVE JOINT DISEASE 07/28/2010  . Dyslipidemia 06/17/2010  . SUBSTANCE ABUSE, MULTIPLE 06/17/2010  . Essential hypertension 06/17/2010  . CAD 06/17/2010  . BRADYCARDIA 06/17/2010   Past Medical History:  Past Medical History:  Diagnosis Date  . Atrial fibrillation ([place])    a. dx 10/2017. b. s/p DCCV on 11/19/2017 with successful conversion to NSR but back in AFIB 3 weeks later  . CAD (coronary artery disease)    a. s/p PCI to LAD 2001. b. s/p stenting to the RCA and mid LAD December 2011, residual distal RCA disease treated medically. b. nuc 02/2013 abnormal with fixed defect seen in inferoapical, mid-inferior, and basal inferior walls, indicative of myocardial scar, no evidence of ischemia, EF 41% (Ef 55-60% by echo same time).  . Carotid artery disease ([place])    carotid Doppler course left side 5069% stenosis  .  Dysrhythmia    AFib  . Habitual alcohol use   . Hyperlipidemia   . Hypertension   . Low back pain   . OSA on CPAP   . Osteoarthritis   . Polysubstance abuse ([place])    use including marijuana and vicodin,which he obtained from the street  . PVC's (premature ventricular contractions)    status post Holter monitor normal sinus rhythm otherwise asymptomatic PVCs.  . Seasonal allergies    Past Surgical History:  Past Surgical History:  Procedure Laterality Date  . CARDIOVERSION N/A 11/19/2017   Procedure: CARDIOVERSION;  Surgeon: Herminio Commons, MD;  [facility];  Service: Cardiovascular;  Laterality: N/A;  . CORONARY ANGIOPLASTY WITH STENT PLACEMENT  2011  . CORONARY ARTERY BYPASS GRAFT N/A 03/10/2019   Procedure: CORONARY ARTERY BYPASS GRAFTING (CABG)X 3 Using Left Internal Mammary Artery and Right Saphenous Vein Grafts;  Surgeon: Ivin Poot, MD;  Location: [facility];  Service: Open Heart Surgery;  Laterality: N/A;  . LEFT HEART CATH AND CORONARY ANGIOGRAPHY N/A 03/08/2019   Procedure: LEFT HEART CATH AND CORONARY ANGIOGRAPHY;  Surgeon: Belva Crome, MD;  Location: [facility] CV LAB;  Service: Cardiovascular;  Laterality: N/A;  . TEE WITHOUT CARDIOVERSION N/A 03/10/2019   Procedure: TRANSESOPHAGEAL ECHOCARDIOGRAM (TEE);  Surgeon: Prescott Gum, Collier Salina, MD;  Location: [facility];  Service: Open Heart Surgery;  Laterality: N/A;  . TONSILLECTOMY     Social History:  reports that he quit  smoking about 19 years ago. His smoking use included cigarettes. He started smoking about 51 years ago. He has a 35.00 pack-year smoking history. He has never used smokeless tobacco. He reports current alcohol use of about 42.0 standard drinks of alcohol per week. He reports current drug use. Drug: Marijuana.  Family / Support Systems Marital Status: (P) Married How Long?: (P) 41 yrs Patient Roles: (P) Spouse, Parent, Other (Comment)(grandparent) Spouse/Significant Other: (P) wife, [name]  @ (C) [phone] Children: (P) son, [name] @ [phone] (and daughter-in-law, [name]) Anticipated Caregiver: (P) wife Ability/Limitations of Caregiver: (P) Min A Caregiver Availability: (P) 24/7 Family Dynamics: (P) Wife at bedside and very encouraging and supportive.  Denies any concerns about being able to provide needed assistance.  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   Abuse/Neglect Abuse/Neglect Assessment Can Be Completed: (P) Yes Physical Abuse: (P) Denies Verbal Abuse: (P) Denies Sexual Abuse: (P) Denies Exploitation of patient/patient's resources: (P) Denies Self-Neglect: (P) Denies  Emotional Status Pt's affect, behavior and adjustment status: (P) Pt sitting up in bed and able to complete assessment interview without any difficulty.  He is pleasant and motivated for therapies.  He denies any significant emotional distress, however, will monitor and refer for neuropsychology as indicated. Recent Psychosocial Issues: None Psychiatric History: None  Patient / Family Perceptions, Expectations & Goals Pt/Family understanding of illness & functional limitations: Pt and wife with good, general understanding of medical issues and current functional limitations/ need for CIR. Premorbid pt/family roles/activities: Pt independent and working f/t. Anticipated changes in roles/activities/participation: Wife to provide primary caregiver support . Pt/family expectations/goals: "I need to be a lot better than I am right now."  [employer]: None Premorbid Home Care/DME Agencies:  None Transportation available at discharge: yes  Discharge Planning Living Arrangements: Spouse/significant other Support Systems: Spouse/significant other, Children Type of Residence: Private residence Insurance Resources: Chartered certified accountant Resources: Employment, Radio broadcast assistant Screen Referred: No Living Expenses: Own Money Management: Spouse Does the patient have any problems obtaining your medications?: No Home Management: pt and spouse Patient/Family Preliminary Plans: Pt to return home with spouse who can provide 24/7 support. Social Work Anticipated Follow Up Needs: HH/OP Expected length of stay: 10-14 days  Clinical Impression Pleasant gentleman here following CABG with post-op CVA with wife at bedside and very supportive.  He is motivated for therapies and states he needs to "... be a lot better than this" for d/c.  Denies any significant emotional distress - will monitor.  Quetzali Heinle 03/29/2019, 3:38 PM

## 2019-03-31 NOTE — Progress Notes (Signed)
Pt reports that slept well last night and was able to get CPAP on when going to sleep.  Informed will do same process tonight before I leave.

## 2019-03-31 NOTE — Progress Notes (Signed)
Occupational Therapy Session Note  Patient Details  Name: Martin [name] MRN: [id number] Date of Birth: [birth year]  Today's Date: 03/31/2019 OT Individual Time: [id number] OT Individual Time Calculation (min): 47 min    Short Term Goals: Week 1:  OT Short Term Goal 1 (Week 1): Pt will complete stand pivot transfers with LRAD with min assist OT Short Term Goal 2 (Week 1): Pt will complete toileting tasks with min assist OT Short Term Goal 3 (Week 1): Pt will complete bathing with min assist with AE as needed OT Short Term Goal 4 (Week 1): Pt will complete LB dressing with min assist with AE as needed OT Short Term Goal 5 (Week 1): Pt will complete 1 grooming activity in standing, with CGA for standing balance, to demonstrate increased standing tolerance  Skilled Therapeutic Interventions/Progress Updates:    Pt in bed to start session.  He needed mod assist for transfer to the left side of the bed adhering to his sternal precautions.  Oxygen sats 95% on 2Ls nasal cannula but pt with increased fluid noted in his abdomen and LEs.  Min assist for transfer stand pivot to the wheelchair with use of the RW for support.  He was able to complete UB bathing and dressing with setup and LB bathing with min assist sit to stand with integration of the LH sponge and reacher.  He was able to use the reacher with mod instructional cueing for drying LEs and feet.  He already had shorts on so just pulled them down for washing his buttocks with max assist and then pulling them up with mod assist.  Therapist provided total assist for TEDs and shoes.  Pt left in the wheelchair with call button and phone in reach with safety belt in place.    Therapy Documentation Precautions:  Precautions Precautions: Sternal, Fall Precaution Booklet Issued: No Precaution Comments: Lft hemiparesis Restrictions Weight Bearing Restrictions: No Other Position/Activity Restrictions: sternal precautions    Pain: Pain  Assessment Pain Scale: 0-10 Pain Score: 5  Faces Pain Scale: Hurts a little bit Pain Type: Acute pain Pain Location: Ankle Pain Orientation: Right Pain Descriptors / Indicators: Aching Pain Onset: With Activity Pain Intervention(s): Medication (See eMAR) ADL: See Care Tool Section for mobility and selfcare tasks  Therapy/Group: Individual Therapy  Maygen Sirico OTR/L 03/31/2019, 12:09 PM

## 2019-03-31 NOTE — Progress Notes (Signed)
Physical Therapy Session Note  Patient Details  Name: Martin [name] MRN: [id number] Date of Birth: 06-15-1955  Today's Date: 03/31/2019 PT Individual Time: 1000-1100 PT Individual Time Calculation (min): 60 min   Short Term Goals: Week 1:  PT Short Term Goal 1 (Week 1): Patient will perform bed mobility with min A adhearing to sternal precautions. PT Short Term Goal 2 (Week 1): Patient will perform basic transfers with mod A using LRAD adhearing to sternal precautions. PT Short Term Goal 3 (Week 1): Patient will initiate gait training. PT Short Term Goal 4 (Week 1): Patient will perform dynamic standing balance activities with min A using LRAD.  Skilled Therapeutic Interventions/Progress Updates:     Patient in w/c in room upon PT arrival. Patient alert and agreeable to PT session. Patient reported 4/10 R ankle pain during session, RN made aware and provided pain medicine during session. PT provided repositioning, rest breaks, and distraction as pain interventions throughout session. Patient was on 2L O2 throughout session.   Therapeutic Activity: Transfers: Patient performed sit to/from stand x3 and stand pivot x1 from the w/c with CGA-min A with mild posterior lean, improved with L foot blocked by PT. Provided verbal cues for no use of UEs to maintain sternal precautions, foot placement, and leaning far forward to stand.  Gait Training:  Patient ambulated 25 feet x2, 32 feet x1, and 15 feet x1 using RW with CGA. Ambulated with step-to gait pattern leading with R, decreased gait speed, decreased step length and height L>R, increased hip and knee flexion, forward trunk lean, and downward head gaze. RPE 6-8/10 after each trial and SPO2 dropped to 88-89% after 3/4 trials and 76% after 1/4 trials, performed pursed lip breathing and recovered to >90% in <1-2 min. Provided verbal cues for looking ahead, erect posture, increased L foot clearance, and paced breathing.  Wheelchair Mobility:   Patient was transported in the w/c with total A throughout session for energy conservation, to maintain sternal precautions, and time management.  Patient sitting EOB, RN made aware and agreeable to patient performing this safely on his own, at end of session with breaks locked, bed alarm set, and all needs within reach. Educated on use of modified 10 point RPE scale throughout session for self monitoring of SOB and energy conservation with activity. Patient receptive to education.   Therapy Documentation Precautions:  Precautions Precautions: Sternal, Fall Precaution Booklet Issued: No Precaution Comments: Lft hemiparesis Restrictions Weight Bearing Restrictions: No Other Position/Activity Restrictions: sternal precautions     Therapy/Group: Individual Therapy  Verlia Kaney L Humphrey Guerreiro PT, DPT  03/31/2019, 1:09 PM

## 2019-03-31 NOTE — Care Management (Signed)
[facility] Individual Statement of Services  Patient Name:  Martin [name]  Date:  03/31/2019  Welcome to the [facility].  Our goal is to provide you with an individualized program based on your diagnosis and situation, designed to meet your specific needs.  With this comprehensive rehabilitation program, you will be expected to participate in at least 3 hours of rehabilitation therapies Monday-Friday, with modified therapy programming on the weekends.  Your rehabilitation program will include the following services:  Physical Therapy (PT), Occupational Therapy (OT), Speech Therapy (ST), 24 hour per day rehabilitation nursing, Therapeutic Recreaction (TR), Neuropsychology, Case Management (Social Worker), Rehabilitation Medicine, Nutrition Services and Pharmacy Services  Weekly team conferences will be held on Wednesdays to discuss your progress.  Your Social Worker will talk with you frequently to get your input and to update you on team discussions.  Team conferences with you and your family in attendance may also be held.  Expected length of stay: 10-14 days   Overall anticipated outcome: supervision  Depending on your progress and recovery, your program may change. Your Social Worker will coordinate services and will keep you informed of any changes. Your Social Worker's name and contact numbers are listed  below.  The following services may also be recommended but are not provided by the [facility] will be made to provide these services after discharge if needed.  Arrangements include referral to agencies that provide these services.  Your insurance has been verified to be:  [facility] Your primary doctor is:  [place] Crane  Pertinent information will be shared with your doctor and your insurance company.  Social  Worker:  [place], [place] or ([phone]   Information discussed with and copy given to patient by: Lennart Pall, 03/31/2019, 3:40 PM

## 2019-03-31 NOTE — Progress Notes (Signed)
Social Work Patient ID: Floyde Parkins, male   DOB: 1956/04/29, 63 y.o.   MRN: [id number]  Met with pt and wife following team conference. Both aware and agreeable with targeted d/c date of 11/3 and supervision goals overall.  Will continue to follow.  Arzu Mcgaughey, LCSW

## 2019-03-31 NOTE — Progress Notes (Signed)
Occupational Therapy Session Note  Patient Details  Name: Martin [name] MRN: [id number] Date of Birth: 11-Jul-1955  Today's Date: 03/31/2019 OT Individual Time: 1305-1403 OT Individual Time Calculation (min): 58 min    Short Term Goals: Week 1:  OT Short Term Goal 1 (Week 1): Pt will complete stand pivot transfers with LRAD with min assist OT Short Term Goal 2 (Week 1): Pt will complete toileting tasks with min assist OT Short Term Goal 3 (Week 1): Pt will complete bathing with min assist with AE as needed OT Short Term Goal 4 (Week 1): Pt will complete LB dressing with min assist with AE as needed OT Short Term Goal 5 (Week 1): Pt will complete 1 grooming activity in standing, with CGA for standing balance, to demonstrate increased standing tolerance  Skilled Therapeutic Interventions/Progress Updates:    Treatment session with focus on bed mobility and sit <> stand while maintaining sternal precautions, and standing balance and endurance during therapeutic activities.  Pt received supine in bed agreeable to therapy session.  Mod cues for sequencing of rolling to complete bed mobility to transition to sitting EOB while maintaining sternal precaution - pt required mod assist.  Completed sit > stand from slightly elevated EOB and transferred stand pivot with RW with min assist.  Engaged in oral care and washing face seated at sink for energy conservation.  Therapist assisted with donning shoes due to difficulty reaching to feet secondary to swelling.  Engaged in sit > stand from w/c and therapy mat with focus on increased anterior weight shift to stand from progressively lower surfaces.  Pt requiring CGA to min assist with tactile cues for weight shift.  Engaged in standing activity at table with focus on increased standing tolerance.  Pt able to tolerate 1:45 and then 2:05 in standing, requiring prolonged seated rest break between each bout of standing.  Returned to room and transferred back to  bed min assist stand pivot with RW and mod assist to lift BLE in to bed.  O2 sats 90-05% on 2L O2 during activity.  Therapy Documentation Precautions:  Precautions Precautions: Sternal, Fall Precaution Booklet Issued: No Precaution Comments: Lft hemiparesis Restrictions Weight Bearing Restrictions: No Other Position/Activity Restrictions: sternal precautions  General:   Vital Signs: Therapy Vitals Temp: 98 F (36.7 C) Temp Source: Oral Pulse Rate: 67 Resp: 18 BP: (!) 145/74 Patient Position (if appropriate): Lying Oxygen Therapy SpO2: 95 % O2 Device: Nasal Cannula Pain:  Pt with no c/o pain   Therapy/Group: Individual Therapy  Simonne Come 03/31/2019, 3:08 PM

## 2019-03-31 NOTE — Patient Care Conference (Signed)
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 Hyponatremia   . Labile blood pressure   . Peripheral edema   . Morbid obesity ([place])   . Acute gout of right ankle   . Hypoalbuminemia due to protein-calorie malnutrition ([place])   . Acute blood loss anemia   . Urinary retention   . OSA (obstructive sleep apnea)   . Chronic diastolic congestive heart failure ([place])   . Debility 03/27/2019  . 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 Lethargy   . Persistent atrial fibrillation ([place])   . Cerebral embolism with cerebral infarction 03/13/2019  . S/P CABG x 3 03/10/2019  . NSTEMI (non-ST elevated myocardial infarction) ([place]) 03/07/2019  . Unstable angina ([place]) 03/07/2019  . OSA on CPAP 05/27/2016  . Obesity 06/10/2011  . Insomnia 06/10/2011  . PVC's (premature ventricular contractions)   . CAD (coronary artery disease)   . Carotid artery disease ([place])   . DEGENERATIVE JOINT DISEASE 07/28/2010  . Dyslipidemia 06/17/2010  . SUBSTANCE ABUSE, MULTIPLE 06/17/2010  . Essential hypertension 06/17/2010  . CAD 06/17/2010  . BRADYCARDIA 06/17/2010    Expected Discharge Date: Expected Discharge Date: 04/11/19  Team Members Present: Physician leading conference: Dr. Delice Lesch Social Worker Present: Lennart Pall, LCSW Nurse Present: Other (comment)(Luz Rosero, RN) PT Present: Excell Seltzer, PT OT Present: Amy Rounds, OT SLP Present: [name], SLP PPS  Coordinator present : Gunnar Fusi, SLP     Current Status/Progress Goal Weekly Team Focus  Bowel/Bladder   Patient is unable to void I/O cath  for no voids Q6 hr and PVR > 339ml  Patient empty   bladder completely  Assess toileting need QS and per MD's/PA orders   Swallow/Nutrition/ Hydration             ADL's   Mod A sit > stand due to sternal precautions, Mod A stand pivot transfers, Max A LB bathing and dressing, Min A UB bathing and dressing  Supervision overall  activity tolerance/endurance, ADL retraining, functional transfers   Mobility   Min A-mod A overall, max A sit<>stand form lower surfaces, min A gait 12' with RW  Supervision overall  Balance, strengthening, functional mobility, gait, L side NMR, patient/caregiver education.   Communication             Safety/Cognition/ Behavioral Observations  Min A recall new info/safety, anticipatory awareness, organization, selective attention  Supervision-Mod I  selective attention, organization, semi-complex problem solving, recall with aid, safety awareness   Pain   Patient c/o right ankle rate pain 7-8/10 on pain scale medicated with Ultram 100mg  po Q4 hrs states pain is gout and need his Prednisone medication,  pain goal < - 2  QS assessment with follow up   Skin              Rehab Goals Patient on target to meet rehab goals: Yes *See Care Plan and progress notes  for long and short-term goals.     Barriers to Discharge  Current Status/Progress Possible Resolutions Date Resolved   Nursing                  PT  Lack of/limited family support  Patient's wife can only provide supervision level assist at home, patient's son can assist PRN, but works during the day.              OT Home environment access/layout;New oxygen                SLP                SW                Discharge Planning/Teaching Needs:  Pt to return home with wife who, along wtih other family, able to provide 24/7 support.  Teaching to be  completed closer to d/c.   Team Discussion:  Chronic hyponatremia; hope to wean O2;  Urinary retention;  Gout flare - meds added.  Still needing I/o caths. Min A - max overall with therapies.  Supervision goals.  Easily distracted.  Revisions to Treatment Plan:  NA    Medical Summary Current Status: Left hemiparesis and decreased mobility secondary to right ACA infarct after CABG Weekly Focus/Goal: Improve mobility, self-care, gout, hyponatremia, urinary retention, supplemental oxygen dependence  Barriers to Discharge: Medical stability;Weight;New oxygen   Possible Resolutions to Barriers: Therapies, wean supplemental O2, gout meds startes, follow PVRs   Continued Need for Acute Rehabilitation Level of Care: The patient requires daily medical management by a physician with specialized training in physical medicine and rehabilitation for the following reasons: Direction of a multidisciplinary physical rehabilitation program to maximize functional independence : Yes Medical management of patient stability for increased activity during participation in an intensive rehabilitation regime.: Yes Analysis of laboratory values and/or radiology reports with any subsequent need for medication adjustment and/or medical intervention. : Yes   I attest that I was present, lead the team conference, and concur with the assessment and plan of the team.   Lennart Pall 03/31/2019, 3:44 PM   Team conference was held via web/ teleconference due to [place] - 19

## 2019-03-31 NOTE — Progress Notes (Addendum)
Martin [name] PHYSICAL MEDICINE & REHABILITATION PROGRESS NOTE   Subjective/Complaints: Patient seen sitting up at the edge of his bed and then performing sit to stand with supervision.  He states he slept well overnight.  He notes improvement in shortness of breath as well as right ankle pain.  He notes persistent edema.  ROS: +SOB, right ankle pain improving. Denies CP, N/V/D  Objective:   No results found. No results for input(s): WBC, HGB, HCT, PLT in the last 72 hours. No results for input(s): NA, K, CL, CO2, GLUCOSE, BUN, CREATININE, CALCIUM in the last 72 hours.  Intake/Output Summary (Last 24 hours) at 03/31/2019 0934 Last data filed at 03/31/2019 0730 Gross per 24 hour  Intake 444 ml  Output 870 ml  Net -426 ml     Physical Exam: Vital Signs Blood pressure (!) 133/95, pulse 73, temperature 98.6 F (37 C), resp. rate 18, weight 124.6 kg, SpO2 95 %. Constitutional: No distress . Vital signs reviewed. HENT: Normocephalic.  Atraumatic. Eyes: EOMI. No discharge. Cardiovascular: No JVD. Respiratory: Normal effort.  No stridor. +[place] GI: Non-distended. Skin: Healing abrasions Psych: Normal mood.  Normal behavior. Musc: LE edema Right ankle TTP, improving Neurological: Alert Motor: LUE 4-4+/5 proximal to distal.  LLE: 3+/5 HF, 4-/5KE and 0/5 ADF RUE: 5/5 proximal to distal, stable RLE: 4-4+/5 proximal to distal.   Assessment/Plan: 1. Functional deficits secondary to right ACA infarct which require 3+ hours per day of interdisciplinary therapy in a comprehensive inpatient rehab setting.  Physiatrist is providing close team supervision and 24 hour management of active medical problems listed below.  Physiatrist and rehab team continue to assess barriers to discharge/monitor patient progress toward functional and medical goals  Care Tool:  Bathing    Body parts bathed by patient: Right arm, Left arm, Chest, Abdomen, Front perineal area, Right upper leg, Left upper leg,  Face   Body parts bathed by helper: Buttocks, Right lower leg, Left lower leg     Bathing assist Assist Level: Moderate Assistance - Patient 50 - 74%     Upper Body Dressing/Undressing Upper body dressing   What is the patient wearing?: Pull over shirt    Upper body assist Assist Level: Supervision/Verbal cueing    Lower Body Dressing/Undressing Lower body dressing      What is the patient wearing?: Pants     Lower body assist Assist for lower body dressing: Minimal Assistance - Patient > 75%     Toileting Toileting Toileting Activity did not occur (Clothing management and hygiene only): N/A (no void or bm)  Toileting assist Assist for toileting: Moderate Assistance - Patient 50 - 74%     Transfers Chair/bed transfer  Transfers assist     Chair/bed transfer assist level: Minimal Assistance - Patient > 75% Chair/bed transfer assistive device: Programmer, multimedia   Ambulation assist   Ambulation activity did not occur: Safety/medical concerns(fatigue/SOB/R ankle pain)  Assist level: Minimal Assistance - Patient > 75% Assistive device: Walker-rolling Max distance: 20'   Walk 10 feet activity   Assist  Walk 10 feet activity did not occur: Safety/medical concerns(fatigue/SOB/R ankle pain)  Assist level: Minimal Assistance - Patient > 75% Assistive device: Walker-rolling   Walk 50 feet activity   Assist Walk 50 feet with 2 turns activity did not occur: Safety/medical concerns(R ankle pain and decreased activity tolerance)         Walk 150 feet activity   Assist Walk 150 feet activity did not occur: Safety/medical concerns(R  ankle pain and decreased activity tolerance)         Walk 10 feet on uneven surface  activity   Assist Walk 10 feet on uneven surfaces activity did not occur: Safety/medical concerns(R ankle pain and decreased activity tolerance)         Wheelchair     Assist     Wheelchair activity did not occur:  Safety/medical concerns(Sternal precautions)         Wheelchair 50 feet with 2 turns activity    Assist    Wheelchair 50 feet with 2 turns activity did not occur: Safety/medical concerns(Sternal precautions)       Wheelchair 150 feet activity     Assist  Wheelchair 150 feet activity did not occur: Safety/medical concerns(Sternal precautions)       Blood pressure (!) 133/95, pulse 73, temperature 98.6 F (37 C), resp. rate 18, weight 124.6 kg, SpO2 95 %.  Medical Problem List and Plan: 1.Left hemiparesis and decreased mobilitysecondary to right ACA infarct after CABG  Continue CIR PRAFO LLE 2. Antithrombotics: -DVT/anticoagulation:Pharmaceutical:Other (comment)--Eliquis -antiplatelet therapy: On low dose ASA. 3. Pain Management:Oxycodone prn 4. Mood:LCSW to follow for evaluation and support. -antipsychotic agents: N/A 5. Neuropsych: This patientiscapable of making decisions onhisown behalf. 6. Skin/Wound Care:Monitor wound for healing. Continue protein supplement to help promote healing. PRAFO LLE 7. Fluids/Electrolytes/Nutrition:encourage PO  Lasix started on 10/23 for peripheral edema, BMP ordered for Monday 8. CAD s/p CABG:Continue sternal precautions. ON ASA and Lipitor. 9. HTN:BP goal 110-140 to allow for adequate cerebral perfusion.  Labile on 10/23 10 Leukocytosis:Resolved  11.Diastolic heart failure: Strict I/O and daily weights. On Lasix daily with low dose BB.   Filed Weights   03/28/19 0500 03/29/19 0757 03/30/19 1607  Weight: 123 kg 121.5 kg 124.6 kg    ?Trending up on 10/23 12. Abnormal LFTs: WNL 10/20  13. Malnutrition: Pre-albumin- 14.1 14. A fib: Monitor for recurrent bradycardia--on Eliquis bid and back on amiodarone and metoprolol daily 15. Persistent hyponatremia:   Sodium 136 on 10/20, labs ordered for Monday 16. OSA/Hypoxia: PFTs with moderate to severe  obstructive airway disease--now on Dulera bid.   CPAP   Wean supplemental oxygen as tolerated, continues to require 17. Urinary retention:DC'ed foley---> bladder training.   Ua negative, ucx no growth  Flomax increased on 10/21  Continue I/O caths 18. Acute Blood Loss Anemia  Hb 10.3 on 10/20, labs ordered for Monday 19. Hypoalbuminemia  Supplement initiated on 10/21 20. Acute Gout Flare  Cont allopurinol   Colchicine started on 10/21  Continues to improve 21.  Morbid obesity: Encouraged weight loss    LOS: 4 days A FACE TO FACE EVALUATION WAS PERFORMED  Chancy Claros [name] 03/31/2019, 9:34 AM

## 2019-03-31 NOTE — Progress Notes (Signed)
Speech Language Pathology Daily Session Note  Patient Details  Name: RAUN KEIL MRN: [id number] Date of Birth: 10-Jul-1955  Today's Date: 03/31/2019 SLP Individual Time: 0733-0829 SLP Individual Time Calculation (min): 56 min  Short Term Goals: Week 1: SLP Short Term Goal 1 (Week 1): Pt will selectively attend to tasks for 15 minutes with Min A for redirection. SLP Short Term Goal 2 (Week 1): Pt will complete functional tasks with Min A for semi-complex problem solving. SLP Short Term Goal 3 (Week 1): Pt will verbally recall 2 safety and/or medical precautions related to deficits with Min A verbal cues.  Skilled Therapeutic Interventions: Pt was seen for skilled ST targeting cognitive goals. SLP facilitated session with complex medication management task, personalized to reflect pt's current medication regamine. Pt organized a BID pill box with 14 1X and/or 2X daily pills with overall Supervision A for organization/tracking with use of list, Mod I for semi-complex problem solving. When provided names of medications, pt also able to recall function and dosages of familiar medications with 90% accuracy. Min A verbal cues provided for redirection throughout session. SLP discussed importance of checking for errors and minimizing distractions when organizing his pill box at home. Pt in agreement. Pt left sitting at edge of bed with alarm set and all needs within reach; RN made aware pt left sitting at edge of bed. Continue per current plan of care.      Pain Pain Assessment Pain Score: 0-No pain  Therapy/Group: Individual Therapy  Arbutus Leas 03/31/2019, 9:48 AM

## 2019-04-01 ENCOUNTER — Inpatient Hospital Stay (HOSPITAL_COMMUNITY): Payer: Medicare Other

## 2019-04-01 ENCOUNTER — Inpatient Hospital Stay (HOSPITAL_COMMUNITY): Payer: Medicare Other | Admitting: Occupational Therapy

## 2019-04-01 NOTE — Progress Notes (Signed)
[place] PHYSICAL MEDICINE & REHABILITATION PROGRESS NOTE   Subjective/Complaints: Pt more sob yesterday. Weight up. Felt much better after IV lasix given. Breathing improved this morning. Feels well. No pain  ROS: Patient denies fever, rash, sore throat, blurred vision, nausea, vomiting, diarrhea, cough,   chest pain, joint or back pain, headache, or mood change.    Objective:   Dg Chest 2 View  Result Date: 03/31/2019 CLINICAL DATA:  Status post recent coronary artery bypass grafting. Pleural effusions. EXAM: CHEST - 2 VIEW COMPARISON:  March 21 2019, March 18, 2019, and March 14, 2019. FINDINGS: There is persistent loculated effusion in the left major fissure with small left pleural effusion elsewhere. There is a right pleural effusion. There is patchy airspace consolidation in the left base region. There is cardiomegaly with pulmonary vascularity normal. No adenopathy. No pneumothorax. Patient is status post coronary artery bypass grafting. There is mild anterior wedging of several lower thoracic vertebral bodies. IMPRESSION: Bilateral pleural effusions. Note that fluid tracks into the left major fissure, similar to recent prior studies. There is apparent consolidation in the left base, at least partially due to atelectasis. Stable cardiomegaly. No pneumothorax. Postoperative coronary artery bypass grafting. Electronically Signed   By: Lowella Grip III M.D.   On: 03/31/2019 21:48   No results for input(s): WBC, HGB, HCT, PLT in the last 72 hours. No results for input(s): NA, K, CL, CO2, GLUCOSE, BUN, CREATININE, CALCIUM in the last 72 hours.  Intake/Output Summary (Last 24 hours) at 04/01/2019 0828 Last data filed at 04/01/2019 0400 Gross per 24 hour  Intake 822 ml  Output 4450 ml  Net -3628 ml     Physical Exam: Vital Signs Blood pressure (!) 147/75, pulse 76, temperature 98 F (36.7 C), temperature source Oral, resp. rate 19, weight 118.1 kg, SpO2 95  %. Constitutional: No distress . Vital signs reviewed. HEENT: EOMI, oral membranes moist Neck: supple Cardiovascular: RRR without murmur. No JVD    Respiratory: CTA Bilaterally without wheezes or rales. Normal effort    GI: BS +, non-tender, non-distended  Skin: Healing abrasions Psych: Normal mood.  Normal behavior. Musc: LLE edema 1+, tr RLE Right ankle pain improved Neurological: Alert Motor: LUE 4-4+/5 proximal to distal.  LLE: 3+/5 HF, 4-/5KE and 0/5 ADF RUE: 5/5 proximal to distal, stable RLE: 4-4+/5 proximal to distal.   Assessment/Plan: 1. Functional deficits secondary to right ACA infarct which require 3+ hours per day of interdisciplinary therapy in a comprehensive inpatient rehab setting.  Physiatrist is providing close team supervision and 24 hour management of active medical problems listed below.  Physiatrist and rehab team continue to assess barriers to discharge/monitor patient progress toward functional and medical goals  Care Tool:  Bathing    Body parts bathed by patient: Right arm, Left arm, Chest, Abdomen, Front perineal area, Right upper leg, Left upper leg, Face, Right lower leg, Left lower leg   Body parts bathed by helper: Buttocks     Bathing assist Assist Level: Moderate Assistance - Patient 50 - 74%     Upper Body Dressing/Undressing Upper body dressing   What is the patient wearing?: Pull over shirt    Upper body assist Assist Level: Supervision/Verbal cueing    Lower Body Dressing/Undressing Lower body dressing      What is the patient wearing?: Pants     Lower body assist Assist for lower body dressing: Minimal Assistance - Patient > 75%     Toileting Toileting Toileting Activity did not occur (  Clothing management and hygiene only): N/A (no void or bm)  Toileting assist Assist for toileting: Moderate Assistance - Patient 50 - 74%     Transfers Chair/bed transfer  Transfers assist     Chair/bed transfer assist level:  Minimal Assistance - Patient > 75% Chair/bed transfer assistive device: Programmer, multimedia   Ambulation assist   Ambulation activity did not occur: Safety/medical concerns(fatigue/SOB/R ankle pain)  Assist level: Contact Guard/Touching assist Assistive device: Walker-rolling Max distance: 32'   Walk 10 feet activity   Assist  Walk 10 feet activity did not occur: Safety/medical concerns(fatigue/SOB/R ankle pain)  Assist level: Contact Guard/Touching assist Assistive device: Walker-rolling   Walk 50 feet activity   Assist Walk 50 feet with 2 turns activity did not occur: Safety/medical concerns(R ankle pain and decreased activity tolerance)         Walk 150 feet activity   Assist Walk 150 feet activity did not occur: Safety/medical concerns(R ankle pain and decreased activity tolerance)         Walk 10 feet on uneven surface  activity   Assist Walk 10 feet on uneven surfaces activity did not occur: Safety/medical concerns(R ankle pain and decreased activity tolerance)         Wheelchair     Assist     Wheelchair activity did not occur: Safety/medical concerns(Sternal precautions)         Wheelchair 50 feet with 2 turns activity    Assist    Wheelchair 50 feet with 2 turns activity did not occur: Safety/medical concerns(Sternal precautions)       Wheelchair 150 feet activity     Assist  Wheelchair 150 feet activity did not occur: Safety/medical concerns(Sternal precautions)       Blood pressure (!) 147/75, pulse 76, temperature 98 F (36.7 C), temperature source Oral, resp. rate 19, weight 118.1 kg, SpO2 95 %.  Medical Problem List and Plan: 1.Left hemiparesis and decreased mobilitysecondary to right ACA infarct after CABG  Continue CIR PRAFO LLE 2. Antithrombotics: -DVT/anticoagulation:Pharmaceutical:Other (comment)--Eliquis -antiplatelet therapy: On low dose ASA. 3. Pain  Management:Oxycodone prn 4. Mood:LCSW to follow for evaluation and support. -antipsychotic agents: N/A 5. Neuropsych: This patientiscapable of making decisions onhisown behalf. 6. Skin/Wound Care:Monitor wound for healing. Continue protein supplement to help promote healing. PRAFO LLE 7. Fluids/Electrolytes/Nutrition:encourage PO  -check labs monday 8. CAD s/p CABG:Continue sternal precautions. ON ASA and Lipitor. 9. HTN:BP goal 110-140 to allow for adequate cerebral perfusion.  Labile on 10/23 10 Leukocytosis:Resolved  11.Diastolic heart failure: Strict I/O and daily weights. On Lasix daily with low dose BB.  -cxr reviewed---bilateral effusions, no obvious edema Filed Weights   03/30/19 1607 03/31/19 1728 04/01/19 0405  Weight: 124.6 kg 124.2 kg 118.1 kg    Weights trending up until yesterday. Pt diuresed quite a bit last night and improved today after IV lasix (-3661ml)  -continue 20mg  po lasix daily  12. Abnormal LFTs: WNL 10/20  13. Malnutrition: Pre-albumin- 14.1 14. A fib: Monitor for recurrent bradycardia--on Eliquis bid and back on amiodarone and metoprolol daily 15. Persistent hyponatremia:   Sodium 136 on 10/20, labs ordered for Monday 16. OSA/Hypoxia: PFTs with moderate to severe obstructive airway disease--now on Dulera bid.   CPAP   Wean supplemental oxygen as tolerated, continues to require 17. Urinary retention:DC'ed foley---> bladder training.   Ua negative, ucx no growth  Flomax increased on 10/21  Voiding with low pvr's now 18. Acute Blood Loss Anemia  Hb 10.3 on 10/20, labs ordered  for Monday 19. Hypoalbuminemia  Supplement initiated on 10/21 20. Acute Gout Flare  Cont allopurinol   Colchicine started on 10/21  Continues to improve 21.  Morbid obesity: Encouraged weight loss    LOS: 5 days A FACE TO FACE EVALUATION WAS PERFORMED  Meredith Staggers 04/01/2019, 8:28 AM

## 2019-04-01 NOTE — Plan of Care (Signed)
  Problem: Consults Goal: RH STROKE PATIENT EDUCATION Description: See Patient Education module for education specifics  Outcome: Progressing Goal: Nutrition Consult-if indicated Outcome: Progressing Goal: Diabetes Guidelines if Diabetic/Glucose > 140 Description: If diabetic or lab glucose is > 140 mg/dl - Initiate Diabetes/Hyperglycemia Guidelines & Document Interventions  Outcome: Progressing   Problem: RH BOWEL ELIMINATION Goal: RH STG MANAGE BOWEL WITH ASSISTANCE Description: STG Manage Bowel with min Assistance. Outcome: Progressing Goal: RH STG MANAGE BOWEL W/MEDICATION W/ASSISTANCE Description: STG Manage Bowel with Medication with min Assistance. Outcome: Progressing   Problem: RH BLADDER ELIMINATION Goal: RH STG MANAGE BLADDER WITH ASSISTANCE Description: STG Manage Bladder With mod I Assistance Outcome: Progressing Goal: RH STG MANAGE BLADDER WITH MEDICATION WITH ASSISTANCE Description: STG Manage Bladder With Medication With mod I Assistance. Outcome: Progressing Goal: RH STG MANAGE BLADDER WITH EQUIPMENT WITH ASSISTANCE Description: STG Manage Bladder With Equipment With mod I Assistance Outcome: Progressing   Problem: RH SKIN INTEGRITY Goal: RH STG SKIN FREE OF INFECTION/BREAKDOWN Outcome: Progressing Goal: RH STG MAINTAIN SKIN INTEGRITY WITH ASSISTANCE Description: STG Maintain Skin Integrity With min Assistance. Outcome: Progressing Goal: RH STG ABLE TO PERFORM INCISION/WOUND CARE W/ASSISTANCE Description: STG Able To Perform Incision/Wound Care With min Assistance. Outcome: Progressing   Problem: RH PAIN MANAGEMENT Goal: RH STG PAIN MANAGED AT OR BELOW PT'S PAIN GOAL Description: Less than 3 out of 10 Outcome: Progressing   Problem: RH KNOWLEDGE DEFICIT Goal: RH STG INCREASE KNOWLEDGE OF DIABETES Description: Pt able to verbalize 2 ways/methods of controlling blood sugar at home and 2 signs/symptoms of low blood sugar Outcome: Progressing Goal: RH STG  INCREASE KNOWLEDGE OF HYPERTENSION Description: Patient able to state 2 methods of controlling high blood pressure Outcome: Progressing Goal: RH STG INCREASE KNOWLEGDE OF HYPERLIPIDEMIA Outcome: Progressing Goal: RH STG INCREASE KNOWLEDGE OF STROKE PROPHYLAXIS Outcome: Progressing   Problem: RH SAFETY Goal: RH STG ADHERE TO SAFETY PRECAUTIONS W/ASSISTANCE/DEVICE Description: STG Adhere to Safety Precautions With mod I Assistance/Device. Outcome: Completed/Met   Problem: RH COGNITION-NURSING Goal: RH STG USES MEMORY AIDS/STRATEGIES W/ASSIST TO PROBLEM SOLVE Description: STG Uses Memory Aids/Strategies With mod I Assistance to Problem Solve. Outcome: Completed/Met Goal: RH STG ANTICIPATES NEEDS/CALLS FOR ASSIST W/ASSIST/CUES Description: STG Anticipates Needs/Calls for Assist With mod I Assistance/Cues. Outcome: Completed/Met

## 2019-04-01 NOTE — Progress Notes (Signed)
Occupational Therapy Session Note  Patient Details  Name: Martin [name] MRN: [id number] Date of Birth: May 02, 1956  Today's Date: 04/01/2019 OT Individual Time: 1300-1429 OT Individual Time Calculation (min): 89 min   Skilled Therapeutic Interventions/Progress Updates:    Pt greeted seated EOB after finishing lunch and agreeable to OT treatment session. Worked on donning shoes with shoe horn. OT handed pt his shoes and he was able to loosen laces, then toss on the ground. Pt still needed oT assist toguide feet into shoes, but was more successful with shoe horn. Pt then needed OT assist to tie shoes. Pt reported need to urinate. Educated on RW placement over top of commode. Pt completed stand-pivot bed>wc with RW and Min A. Pt brought into bathroom and completed stand-pivot over top of commode with min A. Pt tolerated standing for 2 minutes while urinating. Pt on 2L throughout all activity. Pt brought to the sink in wc. Pt washed hands, brushed teeth, and washed hair using shampoo cap. Reviewed keeping elbows in when washing hair to maintain sternal precautions. Pt took rest break, then worked on [employer] with propelling wc. Pt able to push wc for ~ 1 minute before fatiguing. Alternated OT pushing chair and pt pushing chair to reach elevators. Educate don backing wc into elevator with min A to maneuver. OT assisted with pushing ot outside on uneven surfaces 2/2 fatigue. Pt completed LB there-ex seated in wc outside- 2 sets of 10 hip extension, knee extension, and ankle pumps. OT pushed pt back to the floor for time management. Pt brought to dayroom gym and worked on [employer] in [place]. Pt completed five, 1 minute rounds, with 2 minute rest breaks in between. Pt returned to room and completed stand-pivot back to bed with CGA and RW.  Pt left seated EOB with bed alarm on, call bell in reach, and spouse present.   Therapy Documentation Precautions:  Precautions Precautions:  Sternal, Fall Precaution Booklet Issued: No Precaution Comments: Lft hemiparesis Restrictions Weight Bearing Restrictions: No Other Position/Activity Restrictions: sternal precautions  Pain: Pain Assessment Pain Score: 0-No pain  Therapy/Group: Individual Therapy  Valma Cava 04/01/2019, 1:35 PM

## 2019-04-01 NOTE — Progress Notes (Signed)
Speech Language Pathology Daily Session Note  Patient Details  Name: Martin [name] MRN: [id number] Date of Birth: [birth year]  Today's Date: 04/01/2019 SLP Individual Time: [id number] SLP Individual Time Calculation (min): 40 min  Short Term Goals: Week 1: SLP Short Term Goal 1 (Week 1): Pt will selectively attend to tasks for 15 minutes with Min A for redirection. SLP Short Term Goal 2 (Week 1): Pt will complete functional tasks with Min A for semi-complex problem solving. SLP Short Term Goal 3 (Week 1): Pt will verbally recall 2 safety and/or medical precautions related to deficits with Min A verbal cues.  Skilled Therapeutic Interventions: Skilled ST services focused on cognitive skills. SLP facilitated semi-complex to complex problem solving and error awareness skills in three organization tasks, pt required min A fade to supervision A verbal cues (1/3 way through tasks.) Pt demonstrated selective attention in mildly distracting environment (door open) with supervision A verbal cues. Pt requested to get into WC and wash face at sink. SLP assisted with min A during transfer with supervision A verbal cues for recall and use of safety precuations. Pt was left in room with call bell within reach and chair alarm set. ST recommends to continue skilled ST services.      Pain Pain Assessment Pain Scale: 0-10 Pain Score: 0-No pain  Therapy/Group: Individual Therapy  Ninoshka Wainwright  [facility] 04/01/2019, 12:47 PM

## 2019-04-01 NOTE — Progress Notes (Signed)
Pt placed on home CPAP for the night. Will monitor

## 2019-04-01 NOTE — Progress Notes (Signed)
Physical Therapy Session Note  Patient Details  Name: Martin [name] MRN: [id number] Date of Birth: January 01, 1956  Today's Date: 04/01/2019 PT Individual Time: 1002-1059 PT Individual Time Calculation (min): 57 min   Short Term Goals: Week 1:  PT Short Term Goal 1 (Week 1): Patient will perform bed mobility with min A adhearing to sternal precautions. PT Short Term Goal 2 (Week 1): Patient will perform basic transfers with mod A using LRAD adhearing to sternal precautions. PT Short Term Goal 3 (Week 1): Patient will initiate gait training. PT Short Term Goal 4 (Week 1): Patient will perform dynamic standing balance activities with min A using LRAD.  Skilled Therapeutic Interventions/Progress Updates:    Pt seated in w/c at the sink upon PT arrival, agreeable to therapy tx and denies pain. Pt donned shoes with assist from therapist. Pt transported to the gym in w/c for time management and energy conservation. SpO2 93% while on 2L O2/min sitting in w/c. Pt worked on gait and activity tolerance in order to ambulate x 12 ft, 25 ft, x 22 ft and 25 ft with RW and CGA, SpO2 >88% throughout. Pt performed x 5 sit<>stands from mat with UE support on knees in order to maintain sternal precautions, CGA with cues for anterior weightshift, pt maintains posterior lean upon standing. Pt worked on standing balance without UE support focus on minimizing posterior lean while tossing horseshoes, x 2 trials with CGA for balance. Pt ambulated x 12 ft back to w/c with RW and CGA. Pt transported to the dayroom and used kinetron 1 min x 3 working on LE strength and endurance on resistance level 20 cm/sec, SpO2 >94%. Pt transported back to room at end of session and left in w/c with needs in reach and chair alarm set.   Therapy Documentation Precautions:  Precautions Precautions: Sternal, Fall Precaution Booklet Issued: No Precaution Comments: Lft hemiparesis Restrictions Weight Bearing Restrictions: No Other  Position/Activity Restrictions: sternal precautions     Therapy/Group: Individual Therapy  Netta Corrigan, PT, DPT, CSRS 04/01/2019, 9:42 AM

## 2019-04-02 MED ORDER — FUROSEMIDE 20 MG PO TABS
20.0000 mg | ORAL_TABLET | Freq: Two times a day (BID) | ORAL | Status: DC
  Administered 2019-04-02 – 2019-04-11 (×18): 20 mg via ORAL
  Filled 2019-04-02 (×18): qty 1

## 2019-04-02 NOTE — Progress Notes (Signed)
[place] PHYSICAL MEDICINE & REHABILITATION PROGRESS NOTE   Subjective/Complaints: Up at EOB. Reports no new issues with breathing. Pain seems controlled. Good appetite  ROS: Patient denies fever, rash, sore throat, blurred vision, nausea, vomiting, diarrhea, cough, shortness of breath or chest pain, joint or back pain, headache, or mood change.    Objective:   Dg Chest 2 View  Result Date: 03/31/2019 CLINICAL DATA:  Status post recent coronary artery bypass grafting. Pleural effusions. EXAM: CHEST - 2 VIEW COMPARISON:  March 21 2019, March 18, 2019, and March 14, 2019. FINDINGS: There is persistent loculated effusion in the left major fissure with small left pleural effusion elsewhere. There is a right pleural effusion. There is patchy airspace consolidation in the left base region. There is cardiomegaly with pulmonary vascularity normal. No adenopathy. No pneumothorax. Patient is status post coronary artery bypass grafting. There is mild anterior wedging of several lower thoracic vertebral bodies. IMPRESSION: Bilateral pleural effusions. Note that fluid tracks into the left major fissure, similar to recent prior studies. There is apparent consolidation in the left base, at least partially due to atelectasis. Stable cardiomegaly. No pneumothorax. Postoperative coronary artery bypass grafting. Electronically Signed   By: Lowella Grip III M.D.   On: 03/31/2019 21:48   No results for input(s): WBC, HGB, HCT, PLT in the last 72 hours. No results for input(s): NA, K, CL, CO2, GLUCOSE, BUN, CREATININE, CALCIUM in the last 72 hours.  Intake/Output Summary (Last 24 hours) at 04/02/2019 0814 Last data filed at 04/02/2019 0743 Gross per 24 hour  Intake 960 ml  Output 1275 ml  Net -315 ml     Physical Exam: Vital Signs Blood pressure (!) 147/80, pulse 66, temperature 98.7 F (37.1 C), temperature source Oral, resp. rate 18, weight 120.4 kg, SpO2 97 %. Constitutional: No distress .  Vital signs reviewed. HEENT: EOMI, oral membranes moist Neck: supple Cardiovascular: RRR without murmur. No JVD    Respiratory: CTA Bilaterally without wheezes, crackles at bases. Normal effort    GI: BS +, non-tender, non-distended  Skin: Healing abrasions bilateral LE's Psych: Normal mood.  Normal behavior. Musc: LLE edema 1+, tr RLE Right ankle pain improved Neurological: Alert Motor: LUE 4-4+/5 proximal to distal.  LLE: 3+/5 HF, 4-/5KE and 0/5 ADF RUE: 5/5 proximal to distal, stable RLE: 4-4+/5 proximal to distal.   Assessment/Plan: 1. Functional deficits secondary to right ACA infarct which require 3+ hours per day of interdisciplinary therapy in a comprehensive inpatient rehab setting.  Physiatrist is providing close team supervision and 24 hour management of active medical problems listed below.  Physiatrist and rehab team continue to assess barriers to discharge/monitor patient progress toward functional and medical goals  Care Tool:  Bathing    Body parts bathed by patient: Right arm, Left arm, Chest, Abdomen, Front perineal area, Right upper leg, Left upper leg, Face, Right lower leg, Left lower leg   Body parts bathed by helper: Buttocks     Bathing assist Assist Level: Moderate Assistance - Patient 50 - 74%     Upper Body Dressing/Undressing Upper body dressing   What is the patient wearing?: Pull over shirt    Upper body assist Assist Level: Supervision/Verbal cueing    Lower Body Dressing/Undressing Lower body dressing      What is the patient wearing?: Pants     Lower body assist Assist for lower body dressing: Minimal Assistance - Patient > 75%     Toileting Toileting Toileting Activity did not occur Landscape architect  and hygiene only): N/A (no void or bm)  Toileting assist Assist for toileting: Moderate Assistance - Patient 50 - 74%     Transfers Chair/bed transfer  Transfers assist     Chair/bed transfer assist level: Minimal  Assistance - Patient > 75% Chair/bed transfer assistive device: Programmer, multimedia   Ambulation assist   Ambulation activity did not occur: Safety/medical concerns(fatigue/SOB/R ankle pain)  Assist level: Contact Guard/Touching assist Assistive device: Walker-rolling Max distance: 25 ft   Walk 10 feet activity   Assist  Walk 10 feet activity did not occur: Safety/medical concerns(fatigue/SOB/R ankle pain)  Assist level: Contact Guard/Touching assist Assistive device: Walker-rolling   Walk 50 feet activity   Assist Walk 50 feet with 2 turns activity did not occur: Safety/medical concerns(R ankle pain and decreased activity tolerance)         Walk 150 feet activity   Assist Walk 150 feet activity did not occur: Safety/medical concerns(R ankle pain and decreased activity tolerance)         Walk 10 feet on uneven surface  activity   Assist Walk 10 feet on uneven surfaces activity did not occur: Safety/medical concerns(R ankle pain and decreased activity tolerance)         Wheelchair     Assist     Wheelchair activity did not occur: Safety/medical concerns(Sternal precautions)         Wheelchair 50 feet with 2 turns activity    Assist    Wheelchair 50 feet with 2 turns activity did not occur: Safety/medical concerns(Sternal precautions)       Wheelchair 150 feet activity     Assist  Wheelchair 150 feet activity did not occur: Safety/medical concerns(Sternal precautions)       Blood pressure (!) 147/80, pulse 66, temperature 98.7 F (37.1 C), temperature source Oral, resp. rate 18, weight 120.4 kg, SpO2 97 %.  Medical Problem List and Plan: 1.Left hemiparesis and decreased mobilitysecondary to right ACA infarct after CABG  Continue CIR PRAFO LLE 2. Antithrombotics: -DVT/anticoagulation:Pharmaceutical:Other (comment)--Eliquis -antiplatelet therapy: On low dose ASA. 3. Pain  Management:Oxycodone prn 4. Mood:LCSW to follow for evaluation and support. -antipsychotic agents: N/A 5. Neuropsych: This patientiscapable of making decisions onhisown behalf. 6. Skin/Wound Care:Monitor wound for healing. Continue protein supplement to help promote healing. PRAFO LLE 7. Fluids/Electrolytes/Nutrition:encourage PO  -check labs monday 8. CAD s/p CABG:Continue sternal precautions. ON ASA and Lipitor. 9. HTN:BP goal 110-140 to allow for adequate cerebral perfusion.  BP borderline 10/25---observe 10 Leukocytosis:Resolved  11.Diastolic heart failure: Strict I/O and daily weights. On Lasix daily with low dose BB.  -cxr reviewed---bilateral effusions, no obvious edema Filed Weights   03/31/19 1728 04/01/19 0405 04/02/19 0308  Weight: 124.2 kg 118.1 kg 120.4 kg    Weights trending up until 10/23.  Pt diuresed quite a bit after IV lasix Friday night.   - 10/25 pt is -315 for last 24 hrs. Still looks a little fluid overloaded   -increase lasix to 20mg  bid  12. Abnormal LFTs: WNL 10/20  13. Malnutrition: Pre-albumin- 14.1 14. A fib: Monitor for recurrent bradycardia--on Eliquis bid and back on amiodarone and metoprolol daily 15. Persistent hyponatremia:   Sodium 136 on 10/20, labs ordered for Monday 16. OSA/Hypoxia: PFTs with moderate to severe obstructive airway disease--now on Dulera bid.   CPAP   Wean supplemental oxygen as tolerated, continues to require 17. Urinary retention:DC'ed foley---> bladder training.   Ua negative, ucx no growth  Flomax increased on 10/21  Voiding with low  pvr's now 62. Acute Blood Loss Anemia  Hb 10.3 on 10/20, labs ordered for Monday 19. Hypoalbuminemia  Supplement initiated on 10/21 20. Acute Gout Flare  Cont allopurinol   Colchicine started on 10/21  Improved--watch with diuresis 21.  Morbid obesity: Encouraged weight loss    LOS: 6 days A FACE TO FACE EVALUATION WAS PERFORMED  Meredith Staggers 04/02/2019, 8:14 AM

## 2019-04-02 NOTE — Plan of Care (Signed)
  Problem: Consults Goal: RH STROKE PATIENT EDUCATION Description: See Patient Education module for education specifics  Outcome: Progressing Goal: Nutrition Consult-if indicated Outcome: Progressing Goal: Diabetes Guidelines if Diabetic/Glucose > 140 Description: If diabetic or lab glucose is > 140 mg/dl - Initiate Diabetes/Hyperglycemia Guidelines & Document Interventions  Outcome: Progressing   Problem: RH BOWEL ELIMINATION Goal: RH STG MANAGE BOWEL WITH ASSISTANCE Description: STG Manage Bowel with min Assistance. Outcome: Progressing Goal: RH STG MANAGE BOWEL W/MEDICATION W/ASSISTANCE Description: STG Manage Bowel with Medication with min Assistance. Outcome: Progressing   Problem: RH BLADDER ELIMINATION Goal: RH STG MANAGE BLADDER WITH ASSISTANCE Description: STG Manage Bladder With mod I Assistance Outcome: Progressing Goal: RH STG MANAGE BLADDER WITH MEDICATION WITH ASSISTANCE Description: STG Manage Bladder With Medication With mod I Assistance. Outcome: Progressing Goal: RH STG MANAGE BLADDER WITH EQUIPMENT WITH ASSISTANCE Description: STG Manage Bladder With Equipment With mod I Assistance Outcome: Progressing   Problem: RH SKIN INTEGRITY Goal: RH STG SKIN FREE OF INFECTION/BREAKDOWN Outcome: Progressing Goal: RH STG MAINTAIN SKIN INTEGRITY WITH ASSISTANCE Description: STG Maintain Skin Integrity With min Assistance. Outcome: Progressing Goal: RH STG ABLE TO PERFORM INCISION/WOUND CARE W/ASSISTANCE Description: STG Able To Perform Incision/Wound Care With min Assistance. Outcome: Progressing   Problem: RH PAIN MANAGEMENT Goal: RH STG PAIN MANAGED AT OR BELOW PT'S PAIN GOAL Description: Less than 3 out of 10 Outcome: Progressing   Problem: RH KNOWLEDGE DEFICIT Goal: RH STG INCREASE KNOWLEDGE OF DIABETES Description: Pt able to verbalize 2 ways/methods of controlling blood sugar at home and 2 signs/symptoms of low blood sugar Outcome: Progressing Goal: RH STG  INCREASE KNOWLEDGE OF HYPERTENSION Description: Patient able to state 2 methods of controlling high blood pressure Outcome: Progressing Goal: RH STG INCREASE KNOWLEGDE OF HYPERLIPIDEMIA Outcome: Progressing Goal: RH STG INCREASE KNOWLEDGE OF STROKE PROPHYLAXIS Outcome: Progressing

## 2019-04-03 ENCOUNTER — Inpatient Hospital Stay (HOSPITAL_COMMUNITY): Payer: Medicare Other

## 2019-04-03 ENCOUNTER — Inpatient Hospital Stay (HOSPITAL_COMMUNITY): Payer: Medicare Other | Admitting: Speech Pathology

## 2019-04-03 LAB — CBC
HCT: 37.4 % — ABNORMAL LOW (ref 39.0–52.0)
Hemoglobin: 11.8 g/dL — ABNORMAL LOW (ref 13.0–17.0)
MCH: 31.1 pg (ref 26.0–34.0)
MCHC: 31.6 g/dL (ref 30.0–36.0)
MCV: 98.4 fL (ref 80.0–100.0)
Platelets: 243 10*3/uL (ref 150–400)
RBC: 3.8 MIL/uL — ABNORMAL LOW (ref 4.22–5.81)
RDW: 13.2 % (ref 11.5–15.5)
WBC: 6.2 10*3/uL (ref 4.0–10.5)
nRBC: 0 % (ref 0.0–0.2)

## 2019-04-03 LAB — BASIC METABOLIC PANEL
Anion gap: 13 (ref 5–15)
BUN: 14 mg/dL (ref 8–23)
CO2: 27 mmol/L (ref 22–32)
Calcium: 9.2 mg/dL (ref 8.9–10.3)
Chloride: 98 mmol/L (ref 98–111)
Creatinine, Ser: 0.79 mg/dL (ref 0.61–1.24)
GFR calc Af Amer: >60 mL/min (ref >60)
GFR calc non Af Amer: >60 mL/min (ref >60)
Glucose, Bld: 110 mg/dL — ABNORMAL HIGH (ref 70–99)
Potassium: 3.8 mmol/L (ref 3.5–5.1)
Sodium: 138 mmol/L (ref 135–145)

## 2019-04-03 MED ORDER — FUROSEMIDE 40 MG PO TABS
40.0000 mg | ORAL_TABLET | Freq: Once | ORAL | Status: AC
  Administered 2019-04-03: 40 mg via ORAL
  Filled 2019-04-03: qty 1

## 2019-04-03 NOTE — Progress Notes (Signed)
Pt/RN places him on and off. RT filled H2O chamber and ready for pt use.

## 2019-04-03 NOTE — Plan of Care (Signed)
  Problem: Consults Goal: RH STROKE PATIENT EDUCATION Description: See Patient Education module for education specifics  Outcome: Progressing Goal: Nutrition Consult-if indicated Outcome: Progressing   Problem: RH BOWEL ELIMINATION Goal: RH STG MANAGE BOWEL WITH ASSISTANCE Description: STG Manage Bowel with min Assistance. Outcome: Progressing Goal: RH STG MANAGE BOWEL W/MEDICATION W/ASSISTANCE Description: STG Manage Bowel with Medication with min Assistance. Outcome: Progressing   Problem: RH BLADDER ELIMINATION Goal: RH STG MANAGE BLADDER WITH ASSISTANCE Description: STG Manage Bladder With mod I Assistance Outcome: Progressing Goal: RH STG MANAGE BLADDER WITH MEDICATION WITH ASSISTANCE Description: STG Manage Bladder With Medication With mod I Assistance. Outcome: Progressing Goal: RH STG MANAGE BLADDER WITH EQUIPMENT WITH ASSISTANCE Description: STG Manage Bladder With Equipment With mod I Assistance Outcome: Progressing   Problem: RH SKIN INTEGRITY Goal: RH STG MAINTAIN SKIN INTEGRITY WITH ASSISTANCE Description: STG Maintain Skin Integrity With min Assistance. Outcome: Progressing Goal: RH STG ABLE TO PERFORM INCISION/WOUND CARE W/ASSISTANCE Description: STG Able To Perform Incision/Wound Care With min Assistance. Outcome: Progressing   Problem: RH PAIN MANAGEMENT Goal: RH STG PAIN MANAGED AT OR BELOW PT'S PAIN GOAL Description: Less than 3 out of 10 Outcome: Progressing   Problem: RH KNOWLEDGE DEFICIT Goal: RH STG INCREASE KNOWLEDGE OF DIABETES Description: Pt able to verbalize 2 ways/methods of controlling blood sugar at home and 2 signs/symptoms of low blood sugar Outcome: Progressing Goal: RH STG INCREASE KNOWLEDGE OF HYPERTENSION Description: Patient able to state 2 methods of controlling high blood pressure Outcome: Progressing   Problem: Consults Goal: Diabetes Guidelines if Diabetic/Glucose > 140 Description: If diabetic or lab glucose is > 140 mg/dl -  Initiate Diabetes/Hyperglycemia Guidelines & Document Interventions  Outcome: Not Applicable

## 2019-04-03 NOTE — Progress Notes (Signed)
Physical Therapy Session Note  Patient Details  Name: Martin [name] MRN: [id number] Date of Birth: June 18, 1955  Today's Date: 04/03/2019 PT Individual Time: 1300-1415 PT Individual Time Calculation (min): 75 min   Short Term Goals: Week 1:  PT Short Term Goal 1 (Week 1): Patient will perform bed mobility with min A adhearing to sternal precautions. PT Short Term Goal 2 (Week 1): Patient will perform basic transfers with mod A using LRAD adhearing to sternal precautions. PT Short Term Goal 3 (Week 1): Patient will initiate gait training. PT Short Term Goal 4 (Week 1): Patient will perform dynamic standing balance activities with min A using LRAD.  Skilled Therapeutic Interventions/Progress Updates:     Patient in w/c finishing lunch upon PT arrival. Patient alert and agreeable to PT session. Patient denied pain throughout session. On 2L O2/min throughout session.  Therapeutic Activity: Donned B socks and tennis shoes with total A for time management at beginning of session. Patient with ACE wrap to R ankle for edema control/support prior to and throughout session.  Transfers: Patient performed sit to/from stand x4 with min A-CGA using a RW with mild posterior lean initially in standing. Provided verbal cues for leaning far forward to stand, hands on thighs to reduce UE use for sternal precautions, and pushing toes into the floor to prevent rocking back on his heels in standing.   Patient ambulated to/from the bathroom, as below, then asked to attempt to stand to void. He had difficulty managing his pants and required mod A to void, while voiding, he stated he felt weak and shaking in his arms and requested to sit down. PT placed w/c behind him and patient sat following voiding. BP 139/79, HR 79, SPO2 99%. Assisted patient with max-total A for doffing soiled shorts and donn new shorts, took vitals in standing: BP 145/95, HR 84, and SPO2 96% in standing x2 min, patient denied symptoms in  standing. Patient did state that his stomach was upset, RN made aware of symptoms and vitals and provided patient a ginger ale to manage nausea.   Gait Training:  Patient ambulated 30 feet and 55 feet using RW with CGA. Ambulated with decreased gait speed, significantly decreased step length and height bilaterally L>R, L toe out, increased hip and knee flexion, forward trunk lean, and downward head gaze. Provided verbal cues for erect posture and paced breathing for increased breath support. SPO2 92-94% after each trial on 2L/min with increased SOB.  Wheelchair Mobility:  Patient was transported in the w/c with total A throughout session for energy conservation and time management.  Neuromuscular Re-ed: Patient performed standing balance standing on a small red foam wedge with B UE support progressing to no UE support 2x2 min with cues and demonstration to push his toes into the foam wedge to promote a forward weight shift in standing. He then performed sit to/from stands with the red wedge under his forefeet to force him to over compensate for significant posterior lean by pushing his toes into the foam wedge while standing. Required mod-min A x5. He then performed sti to/from stands on level ground x4 with min A x1 and CGA x3 without UE support with decreased posterior lean after the first trial.   Patient in w/c in room on 2L/min O2 at end of session with breaks locked, seat belt alarm set, and all needs within reach. Required seated rest breaks between all activities during session due to moderate SOB and decreased activity tolerance. Patient expressed that he  felt some anxiety during this session due to his slow progress and reported a history of anxiety. When asked if he takes anything for this, he stated that he use to take Xanex, but was not currently taking anything at admission, RN made aware. Educated patient on Neuropsych services provided in [place] and patient expressed interest in utilizing  these services, CSW made aware after session.    Therapy Documentation Precautions:  Precautions Precautions: Sternal, Fall Precaution Booklet Issued: No Precaution Comments: Lft hemiparesis Restrictions Weight Bearing Restrictions: No Other Position/Activity Restrictions: sternal precautions     Therapy/Group: Individual Therapy  Doreene Burke PT, DPT' 04/03/2019, 4:08 PM

## 2019-04-03 NOTE — Progress Notes (Signed)
Martin [name] PHYSICAL MEDICINE & REHABILITATION PROGRESS NOTE   Subjective/Complaints:  Pt reports gout flare in ankle doing much better- denies significant pain.  Was saying he felt a little SOB, however was on tank, not wall when I got in room at lower level than 2.5L- fixed it and he felt SOB resolved.   ROS: Patient denies fever, rash, sore throat, blurred vision, nausea, vomiting, diarrhea, cough, chest pain, joint or back pain, headache, or mood change.    Objective:  Labs ordered for today- not done as of 11am.   No results found. No results for input(s): WBC, HGB, HCT, PLT in the last 72 hours. No results for input(s): NA, K, CL, CO2, GLUCOSE, BUN, CREATININE, CALCIUM in the last 72 hours.  Intake/Output Summary (Last 24 hours) at 04/03/2019 1100 Last data filed at 04/03/2019 0730 Gross per 24 hour  Intake 920 ml  Output 1925 ml  Net -1005 ml     Physical Exam: Vital Signs Blood pressure (!) 153/90, pulse 73, temperature 98.7 F (37.1 C), temperature source Oral, resp. rate 16, weight 122 kg, SpO2 97 %. Constitutional: No distress . Vital signs reviewed. Sitting up in manual w/c at bedside; watching TV, appropriate, NAD HEENT: EOMI, conjugate gaze Neck: supple Cardiovascular: RRR without murmur. No JVD    Respiratory: CTA Bilaterally. Normal effort   After fixed O2 issue- O2 via  at 2.5L GI: BS +, non-tender, non-distended  Skin: Healing abrasions bilateral LE's Psych: Normal mood.  Normal behavior. Musc: LLE edema 1+, tr RLE Right ankle pain improved Neurological: Alert Motor: LUE 4-4+/5 proximal to distal.  LLE: 3+/5 HF, 4-/5KE and 0/5 ADF RUE: 5/5 proximal to distal, stable RLE: 4-4+/5 proximal to distal.   Assessment/Plan: 1. Functional deficits secondary to right ACA infarct which require 3+ hours per day of interdisciplinary therapy in a comprehensive inpatient rehab setting.  Physiatrist is providing close team supervision and 24 hour management of  active medical problems listed below.  Physiatrist and rehab team continue to assess barriers to discharge/monitor patient progress toward functional and medical goals  Care Tool:  Bathing    Body parts bathed by patient: Right arm, Left arm, Chest, Abdomen, Front perineal area, Right upper leg, Left upper leg, Face, Right lower leg, Left lower leg   Body parts bathed by helper: Buttocks     Bathing assist Assist Level: Moderate Assistance - Patient 50 - 74%     Upper Body Dressing/Undressing Upper body dressing   What is the patient wearing?: Pull over shirt    Upper body assist Assist Level: Supervision/Verbal cueing    Lower Body Dressing/Undressing Lower body dressing      What is the patient wearing?: Pants     Lower body assist Assist for lower body dressing: Minimal Assistance - Patient > 75%     Toileting Toileting Toileting Activity did not occur (Clothing management and hygiene only): N/A (no void or bm)  Toileting assist Assist for toileting: Maximal Assistance - Patient 25 - 49%     Transfers Chair/bed transfer  Transfers assist     Chair/bed transfer assist level: Minimal Assistance - Patient > 75% Chair/bed transfer assistive device: Programmer, multimedia   Ambulation assist   Ambulation activity did not occur: Safety/medical concerns(fatigue/SOB/R ankle pain)  Assist level: Contact Guard/Touching assist Assistive device: Walker-rolling Max distance: 25 ft   Walk 10 feet activity   Assist  Walk 10 feet activity did not occur: Safety/medical concerns(fatigue/SOB/R ankle pain)  Assist  level: Contact Guard/Touching assist Assistive device: Walker-rolling   Walk 50 feet activity   Assist Walk 50 feet with 2 turns activity did not occur: Safety/medical concerns(R ankle pain and decreased activity tolerance)         Walk 150 feet activity   Assist Walk 150 feet activity did not occur: Safety/medical concerns(R ankle pain  and decreased activity tolerance)         Walk 10 feet on uneven surface  activity   Assist Walk 10 feet on uneven surfaces activity did not occur: Safety/medical concerns(R ankle pain and decreased activity tolerance)         Wheelchair     Assist     Wheelchair activity did not occur: Safety/medical concerns(Sternal precautions)         Wheelchair 50 feet with 2 turns activity    Assist    Wheelchair 50 feet with 2 turns activity did not occur: Safety/medical concerns(Sternal precautions)       Wheelchair 150 feet activity     Assist  Wheelchair 150 feet activity did not occur: Safety/medical concerns(Sternal precautions)       Blood pressure (!) 153/90, pulse 73, temperature 98.7 F (37.1 C), temperature source Oral, resp. rate 16, weight 122 kg, SpO2 97 %.  Medical Problem List and Plan: 1.Left hemiparesis and decreased mobilitysecondary to right ACA infarct after CABG  Continue CIR PRAFO LLE 2. Antithrombotics: -DVT/anticoagulation:Pharmaceutical:Other (comment)--Eliquis -antiplatelet therapy: On low dose ASA. 3. Pain Management:Oxycodone prn 4. Mood:LCSW to follow for evaluation and support. -antipsychotic agents: N/A 5. Neuropsych: This patientiscapable of making decisions onhisown behalf. 6. Skin/Wound Care:Monitor wound for healing. Continue protein supplement to help promote healing. PRAFO LLE 7. Fluids/Electrolytes/Nutrition:encourage PO  -check labs Monday  10/26- labs still pending- will see by tomorrow 8. CAD s/p CABG:Continue sternal precautions. ON ASA and Lipitor. 9. HTN:BP goal 110-140 to allow for adequate cerebral perfusion.  BP borderline 10/25---observe 10 Leukocytosis:Resolved  11.Diastolic heart failure: Strict I/O and daily weights. On Lasix daily with low dose BB.  -cxr reviewed---bilateral effusions, no obvious edema Filed Weights   04/01/19  0405 04/02/19 0308 04/03/19 0420  Weight: 118.1 kg 120.4 kg 122 kg    Weights trending up until 10/23.  Pt diuresed quite a bit after IV lasix Friday night.   - 10/25 pt is -315 for last 24 hrs. Still looks a little fluid overloaded   -increase lasix to 20mg  bid  10/26- 4 kg higher in 2 days- will give another 40 mg Lasix x1 and follow closely. 12. Abnormal LFTs: WNL 10/20  13. Malnutrition: Pre-albumin- 14.1 14. A fib: Monitor for recurrent bradycardia--on Eliquis bid and back on amiodarone and metoprolol daily 15. Persistent hyponatremia:   Sodium 136 on 10/20, labs ordered for Monday 16. OSA/Hypoxia: PFTs with moderate to severe obstructive airway disease--now on Dulera bid.   CPAP   Wean supplemental oxygen as tolerated, continues to require 17. Urinary retention:DC'ed foley---> bladder training.   Ua negative, ucx no growth  Flomax increased on 10/21  Voiding with low pvr's now 18. Acute Blood Loss Anemia  Hb 10.3 on 10/20, labs ordered for Monday 19. Hypoalbuminemia  Supplement initiated on 10/21 20. Acute Gout Flare  Cont allopurinol   Colchicine started on 10/21  Improved--watch with diuresis  10/26- improved 21.  Morbid obesity: Encouraged weight loss    LOS: 7 days A FACE TO FACE EVALUATION WAS PERFORMED  Martin [name] 04/03/2019, 11:00 AM

## 2019-04-03 NOTE — Progress Notes (Addendum)
Speech Language Pathology Daily Session Note  Patient Details  Name: Martin [name] MRN: [id number] Date of Birth: October 05, 1955  Today's Date: 04/03/2019 SLP Individual Time: 0732-0832 SLP Individual Time Calculation (min): 60 min  Short Term Goals: Week 1: SLP Short Term Goal 1 (Week 1): Pt will selectively attend to tasks for 15 minutes with Min A for redirection. SLP Short Term Goal 2 (Week 1): Pt will complete functional tasks with Min A for semi-complex problem solving. SLP Short Term Goal 3 (Week 1): Pt will verbally recall 2 safety and/or medical precautions related to deficits with Min A verbal cues.  Skilled Therapeutic Interventions: Pt was seen for skilled ST targeting cognitive goals. 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 Using guidelines from a past project, pt created schematic drawing (including measurements) of a chimney Mod I. SLP further facilitated session with semi-complex pipe tree designs, which pt constructed with Supervision A verbal and visual cues for problem solving and mental flexibility in situations in which necessary pieces were missing (making substitutions). Pt selectively attended to tasks in a mildly distracting environment Mod I throughout 60 min session. Pt left sitting in wheelchair with seatbelt alarm engaged and all needs met. Continue per current plan of care.      Pain Pain Assessment Pain Scale: 0-10 Pain Score: 0-No pain  Therapy/Group: Individual Therapy  Arbutus Leas 04/03/2019, 9:33 AM

## 2019-04-03 NOTE — Progress Notes (Signed)
Occupational Therapy Session Note  Patient Details  Name: Martin [name] MRN: [id number] Date of Birth: 1956/03/20  Today's Date: 04/03/2019 OT Individual Time: 1000-1100 OT Individual Time Calculation (min): 60 min    Short Term Goals: Week 1:  OT Short Term Goal 1 (Week 1): Pt will complete stand pivot transfers with LRAD with min assist OT Short Term Goal 2 (Week 1): Pt will complete toileting tasks with min assist OT Short Term Goal 3 (Week 1): Pt will complete bathing with min assist with AE as needed OT Short Term Goal 4 (Week 1): Pt will complete LB dressing with min assist with AE as needed OT Short Term Goal 5 (Week 1): Pt will complete 1 grooming activity in standing, with CGA for standing balance, to demonstrate increased standing tolerance  Skilled Therapeutic Interventions/Progress Updates:    OT intervention with focus on BADL training, functional transfers, sit<>stand, standing balance, activity tolerance, and safety awareness to increase independence with BADLs. Pt completed bathing tasks with sit<>stand in shower.  Pt used long handle sponge for BLE but required assistance with buttocks.  Pt required min verbal cues to follow sternal precautions.  Stand pivot transfers with min A and min verbal cues for adherence with precautions.  Pt donned pants using reacher and required assistance pulling over hips.  Pt tot A for donning Ted Hose and nonslip socks. Pt required multiple rest breaks 2/2 SOB with activity.  Pt O2 sats>90% on 2L O2. Pt performed stand pivot transfer to bed with min A using RW.  Pt remained seated EOB with bed alarm activated and all needs within reach.   Therapy Documentation Precautions:  Precautions Precautions: Sternal, Fall Precaution Booklet Issued: No Precaution Comments: Lft hemiparesis Restrictions Weight Bearing Restrictions: No Other Position/Activity Restrictions: sternal precautions   Pain: Pain Assessment Pain Scale: 0-10 Pain Score:  0-No pain   Therapy/Group: Individual Therapy  Leroy Libman 04/03/2019, 12:26 PM

## 2019-04-04 ENCOUNTER — Inpatient Hospital Stay (HOSPITAL_COMMUNITY): Payer: Medicare Other

## 2019-04-04 ENCOUNTER — Inpatient Hospital Stay (HOSPITAL_COMMUNITY): Payer: Medicare Other | Admitting: Speech Pathology

## 2019-04-04 LAB — BASIC METABOLIC PANEL
Anion gap: 7 (ref 5–15)
BUN: 14 mg/dL (ref 8–23)
CO2: 31 mmol/L (ref 22–32)
Calcium: 9.1 mg/dL (ref 8.9–10.3)
Chloride: 100 mmol/L (ref 98–111)
Creatinine, Ser: 0.84 mg/dL (ref 0.61–1.24)
GFR calc Af Amer: >60 mL/min (ref >60)
GFR calc non Af Amer: >60 mL/min (ref >60)
Glucose, Bld: 134 mg/dL — ABNORMAL HIGH (ref 70–99)
Potassium: 3.6 mmol/L (ref 3.5–5.1)
Sodium: 138 mmol/L (ref 135–145)

## 2019-04-04 LAB — BRAIN NATRIURETIC PEPTIDE: B Natriuretic Peptide: 244.2 pg/mL — ABNORMAL HIGH (ref 0.0–100.0)

## 2019-04-04 NOTE — Progress Notes (Addendum)
Physical Therapy Weekly Progress Note  Patient Details  Name: Martin [name] MRN: [id number] Date of Birth: 05/24/1956  Beginning of progress report period: March 28, 2019 End of progress report period: April 04, 2019  Today's Date: 04/04/2019 PT Individual Time: 1015-1101 PT Individual Time Calculation (min): 46 min   Patient has met 4 of 4 short term goals.  Patient is progressing well with therapies, continues to be limited by decreased activity tolerance with SOB with minimal activity. He currently requires min A for bed mobility and transfers using a RW and CGA for gait up to 55 feet and frequent cues for maintaining sternal precautions with all mobility. He had significant R ankle pain early on due to a gout flare-up, but pain has improved and is no longer a significant barrier for mobility. He continues to have decreased balance, decreased motor control and strength of his L LE, decreased activity tolerance and need for increased O2 support of 2L/min with activity and at rest to maintain SPO2 >90%. He will benefit from continued skilled high intensity therapy to address these deficits.   Patient continues to demonstrate the following deficits muscle weakness and muscle joint tightness, decreased cardiorespiratoy endurance and decreased oxygen support, impaired timing and sequencing, abnormal tone, decreased coordination and decreased motor planning, decreased problem solving and decreased memory and decreased sitting balance, decreased standing balance, decreased postural control, hemiplegia, decreased balance strategies and difficulty maintaining precautions and therefore will continue to benefit from skilled PT intervention to increase functional independence with mobility.  Patient progressing toward long term goals..  Continue plan of care.  PT Short Term Goals Week 1:  PT Short Term Goal 1 (Week 1): Patient will perform bed mobility with min A adhearing to sternal  precautions. PT Short Term Goal 1 - Progress (Week 1): Met PT Short Term Goal 2 (Week 1): Patient will perform basic transfers with mod A using LRAD adhearing to sternal precautions. PT Short Term Goal 2 - Progress (Week 1): Met PT Short Term Goal 3 (Week 1): Patient will initiate gait training. PT Short Term Goal 3 - Progress (Week 1): Met PT Short Term Goal 4 (Week 1): Patient will perform dynamic standing balance activities with min A using LRAD. PT Short Term Goal 4 - Progress (Week 1): Met Week 2:  PT Short Term Goal 1 (Week 2): STG=LTG due to ELOS.  Skilled Therapeutic Interventions/Progress Updates:     Patient in w/c in room upon PT arrival. Patient alert and agreeable to PT session. Patient denied pain throughout session. PT discussed trying this session on RA to assess ability for patient to wean of oxygen support. Patient in agreement and discussed some anxiety about being off the oxygen. PT provided education about safe oxygen levels and recovery techniques and stated that the O2 tank would be in reach at all times if needed at any time during session. Patient stated understanding and that he felt comfortable trying to stay on RA during session.   Therapeutic Activity: Transfers: Patient performed sit to/from stand x5 with min A-CGA, required 1-3 trials x2 due to posterior lean causing LOB when attempting to stand. Provided verbal cues for scooting forward in the w/c, leaning far forward to stand, and pushing his toes, L>R, into the floor to prevent posterior LOB when standing.  Gait Training:  Patient ambulated 21 fee, 77 fee, 68 feet, 35 feet, and 41 feet using RW with CGA-close supervision. Ambulated with step-through gait pattern, narrow BOS, very small B step length,  B foot flat with intermittent forefoot placement at initial contact, and variable gait speed, increasing more with SOB. Provided verbal cues for erect posture and paced breathing for improved breath support, decreased  gait speed for improved activity tolerance with longer distances, and increased step length and height. Patient remained on RA throughout gait training, SPO2 dropping to mid 80's following each trial, however, able to recover to>90% in 1-2 min with diaphragmatic breathing and maintained 93-98% on RA during seated rest breaks between trials.   Wheelchair Mobility:  Patient was transported in the w/c with total A throughout session for energy conservation and time management.  Patient in w/c in room on RA, SPO2 96%, with Erin, SLP at end of session with breaks locked and all needs within reach. SLP and RN made aware that patient was on RA and informed of O2 sats throughout session.   Therapy Documentation Precautions:  Precautions Precautions: Sternal, Fall Precaution Booklet Issued: No Precaution Comments: Lft hemiparesis Restrictions Weight Bearing Restrictions: No Other Position/Activity Restrictions: sternal precautions    Therapy/Group: Individual Therapy  Kena Limon L Farris Blash PT, DPT  04/04/2019, 12:29 PM

## 2019-04-04 NOTE — Patient Care Conference (Signed)
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 Generalized anxiety disorder   . Hyponatremia   . Labile blood pressure   . Peripheral edema   . Morbid obesity ([place])   . Acute gout of right ankle   . Hypoalbuminemia due to protein-calorie malnutrition ([place])   . Acute blood loss anemia   . Urinary retention   . OSA (obstructive sleep apnea)   . Chronic diastolic congestive heart failure ([place])   . Debility 03/27/2019  . 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 Lethargy   . Persistent atrial fibrillation ([place])   . Cerebral embolism with cerebral infarction 03/13/2019  . S/P CABG x 3 03/10/2019  . NSTEMI (non-ST elevated myocardial infarction) ([place]) 03/07/2019  . Unstable angina ([place]) 03/07/2019  . OSA on CPAP 05/27/2016  . Obesity 06/10/2011  . Insomnia 06/10/2011  . PVC's (premature ventricular contractions)   . CAD (coronary artery disease)   . Carotid artery disease ([place])   . DEGENERATIVE JOINT DISEASE 07/28/2010  . Dyslipidemia 06/17/2010  . SUBSTANCE ABUSE, MULTIPLE 06/17/2010  . Essential hypertension 06/17/2010  . CAD 06/17/2010  . BRADYCARDIA 06/17/2010    Expected Discharge Date: Expected Discharge Date: 04/11/19  Team Members Present: Physician leading conference: Dr. Courtney Heys Social Worker Present: Lennart Pall, LCSW Nurse Present: Benjie Karvonen, RN PT Present: Apolinar Junes, PT OT Present: Roanna Epley, [place], OT SLP Present: Jettie Booze, CF-SLP Other (Discipline and Name): Case Manager:  Karene Fry, RN PPS Coordinator present : Ileana Ladd, Burna Mortimer, SLP     Current Status/Progress Goal Weekly Team Focus  Bowel/Bladder   Continent x2 LBM 10/27  Continue to be able to empty bladder  Assess toileting needs qshift and PRN   Swallow/Nutrition/ Hydration             ADL's   functional transfers-min A/CGA, bathing/dressing-min A for LB using AE, min verbal cues for sternal precautions, limited activity tolerance  Supervision overall  activity tolerance/endurance, ADL training, safety awareness   Mobility   Min A overall, gait 55 fee with RW on 2L O2  Supervision to CGA overall  Activity tolerance, decreased O2 support with activiy, balance, L side strengthening and NMR, functional mobility, gait training, patient/caregiver education.   Communication             Safety/Cognition/ Behavioral Observations  Min A recall new info/day to day, Supervision-Mod I semi-complex problem solving, selective attention  Supervision A-Mod I  compensatory memory strategies, selective attention, anticipatory awareness, family edu   Pain   c/o pain to right ankle relieved with voltaren gel  2/10  assess pain qshift and PRN   Skin   Dry cracked skin to bottom. Barrier cream applied.  Keep skin intact and free of infection/breakdown  assess skin qshift PRN    Rehab Goals Patient on target to meet rehab goals: Yes *See Care Plan and progress notes for long and  short-term goals.     Barriers to Discharge  Current Status/Progress Possible Resolutions Date Resolved   Nursing                  PT  Decreased caregiver support;Home environment access/layout;New oxygen  2 steps without rails to enter home, patient's wife can only provide CGA-supervision with mobility at home, patient currently requires 2L/min O2 with all mobility.  Has not started stair training during therapy, have inititated trials  with decreased O2 support with activity           OT                  SLP                SW                Discharge Planning/Teaching Needs:  Pt to return home with wife who, along wtih other family, able to provide 24/7 support.  Teaching to be completed closer to d/c.   Team Discussion: On 2 1/2 L O2, gout flare better, restless, did not sleep well, feels jittery, labs okay, need to wean O2.  Cont B/B, PVR no retention.  OT showered yesterday, min a bathing, using adaptive equipment, Bathing/dressing at sink, SOB, O2 was off, O2 sats 94%, steady progress, S goals.  PT anxious yesterday, O2 sats 96%, h/o anxiety, amb 55', O2 sats decreased.  SLP working on higher level, complex tasks.  Goals S/mod I.    Revisions to Treatment Plan: N/A     Medical Summary Current Status: weaning O2 from 2.5L; no pain; no skin issues Weekly Focus/Goal: shower- min assist shower- anxiety increased when feels SOB; slowing weaning off O2  Barriers to Discharge: Decreased family/caregiver support;Medical stability;Medication compliance;Home enviroment access/layout;New oxygen  Barriers to Discharge Comments: n/a Possible Resolutions to Barriers: therapy/endurance improvement; hx of anxiety (used to take Xanax); 96-98% with gait (lowest 93-95%); was 97% on RA after PT; ST working on higher level cognition. memory strategies   Continued Need for Acute Rehabilitation Level of Care: The patient requires daily medical management by a physician with specialized training in physical medicine and rehabilitation for the following reasons: Direction of a multidisciplinary physical rehabilitation program to maximize functional independence : Yes Medical management of patient stability for increased activity during participation in an intensive rehabilitation regime.: Yes Analysis of laboratory values and/or radiology reports with any subsequent need for medication adjustment and/or medical intervention. : Yes   I  attest that I was present, lead the team conference, and concur with the assessment and plan of the team.   Retta Diones 04/05/2019, 12:03 PM  Team conference was held via web/ teleconference due to [place] - 19

## 2019-04-04 NOTE — Progress Notes (Signed)
Occupational Therapy Weekly Progress Note  Patient Details  Name: Martin [name] MRN: [id number] Date of Birth: 24-Aug-1955  Beginning of progress report period: March 28, 2019 End of progress report period: April 04, 2019  Patient has met 5 of 5 short term goals.  Pt made steady progress with BADLs and functional transfers during the past week.  Pt performs sit<>stand and stand pivot transfers using RW with min A and min verbal cues for adherence to sternal precautions.  Pt requires min A for LB bathing/dressing tasks. Pt uses AE (long handle sponge and reacher) to assist with bathing/dressing tasks.  Pt fatigues quickly and requires multiple rest breaks during sessions.  Pt currently on 2L O2 with O2 sats>90%.  Patient continues to demonstrate the following deficits: muscle weakness, decreased cardiorespiratoy endurance and decreased oxygen support, decreased coordination, decreased attention, decreased awareness, decreased problem solving and decreased memory and decreased standing balance and decreased balance strategies and therefore will continue to benefit from skilled OT intervention to enhance overall performance with BADL and Reduce care partner burden.  Patient progressing toward long term goals..  Continue plan of care.  OT Short Term Goals Week 1:  OT Short Term Goal 1 (Week 1): Pt will complete stand pivot transfers with LRAD with min assist OT Short Term Goal 1 - Progress (Week 1): Met OT Short Term Goal 2 (Week 1): Pt will complete toileting tasks with min assist OT Short Term Goal 2 - Progress (Week 1): Met OT Short Term Goal 3 (Week 1): Pt will complete bathing with min assist with AE as needed OT Short Term Goal 3 - Progress (Week 1): Met OT Short Term Goal 4 (Week 1): Pt will complete LB dressing with min assist with AE as needed OT Short Term Goal 4 - Progress (Week 1): Met OT Short Term Goal 5 (Week 1): Pt will complete 1 grooming activity in standing, with CGA for  standing balance, to demonstrate increased standing tolerance OT Short Term Goal 5 - Progress (Week 1): Met Week 2:  OT Short Term Goal 1 (Week 2): STG=LTG secondary to ELOS       Leroy Libman 04/04/2019, 6:24 AM

## 2019-04-04 NOTE — Progress Notes (Signed)
Occupational Therapy Session Note  Patient Details  Name: Martin [name] MRN: [id number] Date of Birth: Nov 23, 1955  Today's Date: 04/04/2019 OT Individual Time: 1305-1350 OT Individual Time Calculation (min): 45 min    Short Term Goals: Week 2:  OT Short Term Goal 1 (Week 2): STG=LTG secondary to ELOS  Skilled Therapeutic Interventions/Progress Updates:    OT intervention with focus on sit<>stand from lower surface (18') and simulated toileting (hygiene) tasks with AE (toilet aide). Pt's wife present and educated pt on continued therapy after discharge.  Pt contemplating lift chair for use at home.  Pt's wife is against purchase.  Recommended pt not purchase and educated pt on reasoning behind recommendation.  Pt practiced sit<>stand X 3 with 5 squats each occurrence and simulated reaching between BLE to use toilet aide for hygiene.  Pt required min A for sit<>stand and CGA during simulation.  O2 sats>90% on RA throughout session.  Pt SOB and required min verbal cues for breathing and relaxation strategies/tecniques.  Pt requested to sit in w/c. Pt remained in w/c with all needs within reach and belt alarm activated.   Therapy Documentation Precautions:  Precautions Precautions: Sternal, Fall Precaution Booklet Issued: No Precaution Comments: Lft hemiparesis Restrictions Weight Bearing Restrictions: No Other Position/Activity Restrictions: sternal precautions  Pain: Pain Assessment Pain Score: 0-No pain   Therapy/Group: Individual Therapy  Leroy Libman 04/04/2019, 2:12 PM

## 2019-04-04 NOTE — Progress Notes (Signed)
RT added sterile water to water chamber of patients home CPAP machine.  Home CPAP within reach. RT assistance not needed at this time.

## 2019-04-04 NOTE — Progress Notes (Signed)
Occupational Therapy Session Note  Patient Details  Name: Martin [name] MRN: [id number] Date of Birth: 1955/11/30  Today's Date: 04/04/2019 OT Individual Time: [id number] OT Individual Time Calculation (min): 60 min    Short Term Goals: Week 2:  OT Short Term Goal 1 (Week 2): STG=LTG secondary to ELOS  Skilled Therapeutic Interventions/Progress Updates:    OT intervention with focus on BADL training, sit<>stand, standing balance, activity tolerance, and safety awareness to increase independence with BADLs.  Pt completed bathing/dressing tasks with sit<>stand at sink. Pt performs sit<>stand with min A and CGA for standing balance for bathing and LB dressing tasks.  Pt declined bathing feet today and removing Ted hose. Pt used reacher to don pants.  Pt able to pull pants over hips today. Pt SOB numerous times during session.  Pt requires min verbal cues for breathing and relaxation techniques. Pt O2 sats >90% on RA during session.  Pt requires multiple rest breaks during session. Pt states he needs to work on cleaning himself after BM.  Will address in future sessions.  Pt remained seated in w/c with all needs wihtin reach and belt alarm activated  Therapy Documentation Precautions:  Precautions Precautions: Sternal, Fall Precaution Booklet Issued: No Precaution Comments: Lft hemiparesis Restrictions Weight Bearing Restrictions: No Other Position/Activity Restrictions: sternal precautions  Pain: Pain Assessment Pain Scale: 0-10 Pain Score: 0-No pain  Therapy/Group: Individual Therapy  Leroy Libman 04/04/2019, 11:27 AM

## 2019-04-04 NOTE — Progress Notes (Signed)
Speech Language Pathology Weekly Progress and Session Note  Patient Details  Name: Martin [name] MRN: [id number] Date of Birth: 1956-01-25  Beginning of progress report period: March 28, 2019 End of progress report period: April 04, 2019  Today's Date: 04/04/2019 SLP Individual Time: 5038-8828 SLP Individual Time Calculation (min): 26 min  Short Term Goals: Week 1: SLP Short Term Goal 1 (Week 1): Pt will selectively attend to tasks for 15 minutes with Min A for redirection. SLP Short Term Goal 1 - Progress (Week 1): Met SLP Short Term Goal 2 (Week 1): Pt will complete functional tasks with Min A for semi-complex problem solving. SLP Short Term Goal 2 - Progress (Week 1): Met SLP Short Term Goal 3 (Week 1): Pt will verbally recall 2 safety and/or medical precautions related to deficits with Min A verbal cues. SLP Short Term Goal 3 - Progress (Week 1): Met    New Short Term Goals: Week 2: SLP Short Term Goal 1 (Week 2): STG = LTG due to remaining LOS  Weekly Progress Updates: Pt has made excellent functional gains and met 3 out of 3 short term goals. Pt is currently Min-Supervision assist for due to cognitive impairments impacting semi-complex/complex problem solving, executive functioning (organiation, planning), selective attention and short term memory. Pt has demonstrated improved basic and semi-complex problem solving and selective attention since initial evaluation. Pt and family education is ongoing. Pt would continue to benefit from skilled ST while inpatient in order to maximize functional independence and reduce burden of care prior to discharge. Anticipate that pt will need 24/7 supervision at discharge; ST follow up to be determined, but anticipate he will meet Supervision-Mod I goals while inpatient and may not need follow up [place].   Intensity: Minumum of 1-2 x/day, 30 to 90 minutes Frequency: 3 to 5 out of 7 days Duration/Length of Stay:  04/11/19 Treatment/Interventions: Cognitive remediation/compensation;Cueing hierarchy;Functional tasks;Patient/family education;Therapeutic Activities;Environmental controls;Internal/external aids   Daily Session  Skilled Therapeutic Interventions: Pt was seen for skilled ST targeting cognitive goals. Pt was received sitting in wheelchair finishing with PT - he expressed excitement regarding ability to wean off O2 today - SpO2remaining stable around 96 at rest sitting in chair. SLP facilitated session with introduction of compensatory memory strategies (WRAP) via verbal explanation with concrete examples as well as written handout. Blank paper left in pt's room to assist with ability to practice note taking strategy while inpatient. SLP further facilitated session with Min faded to Supervision A for semi-complex problem solving and Min A for use of compensatory memory strategy to recall rules during a novel semi-complex card game (Blink). Pt was left sitting in wheelchair with seatbelt alarm engaged and all needs within reach. Continue per current plan of care.      Pain Pain Assessment Pain Score: 0-No pain  Therapy/Group: Individual Therapy  Martin [name] 04/04/2019, 12:02 PM

## 2019-04-04 NOTE — Progress Notes (Signed)
[place] PHYSICAL MEDICINE & REHABILITATION PROGRESS NOTE   Subjective/Complaints:  Pt reports restless sleep- took trazodone, he thinks, but he thinks it made him jittery/kept him awake.    ROS: Patient denies fever, rash, sore throat, blurred vision, nausea, vomiting, diarrhea, cough, chest pain, joint or back pain, headache, or mood change.    Objective:    No results found. Recent Labs    04/03/19 1313  WBC 6.2  HGB 11.8*  HCT 37.4*  PLT 243   Recent Labs    04/03/19 1313 04/04/19 0524  NA 138 138  K 3.8 3.6  CL 98 100  CO2 27 31  GLUCOSE 110* 134*  BUN 14 14  CREATININE 0.79 0.84  CALCIUM 9.2 9.1    Intake/Output Summary (Last 24 hours) at 04/04/2019 0920 Last data filed at 04/04/2019 0800 Gross per 24 hour  Intake 840 ml  Output 3275 ml  Net -2435 ml     Physical Exam: Vital Signs Blood pressure (!) 143/74, pulse 67, temperature 98.1 F (36.7 C), temperature source Oral, resp. rate 17, weight 120.1 kg, SpO2 95 %. Constitutional: No distress . Vital signs and labs reviewed. Sitting up in manual w/c at bedside; watching TV, appropriate, wearing O2 by [place], hands steady- no jitteriness pt was complaining about, NAD HEENT: EOMI, conjugate gaze Neck: supple Cardiovascular: RRR without murmur. No JVD    Respiratory: CTA Bilaterally. Normal effort   2.5L O2 by [place] GI: BS +, non-tender, non-distended  Skin: Healing abrasions bilateral LE's Psych: Normal mood.  Normal behavior. Musc: LLE edema 1+, tr RLE Right ankle pain improved Neurological: Alert Motor: LUE 4-4+/5 proximal to distal.  LLE: 3+/5 HF, 4-/5KE and 0/5 ADF RUE: 5/5 proximal to distal, stable RLE: 4-4+/5 proximal to distal.   Assessment/Plan: 1. Functional deficits secondary to right ACA infarct which require 3+ hours per day of interdisciplinary therapy in a comprehensive inpatient rehab setting.  Physiatrist is providing close team supervision and 24 hour management of active medical  problems listed below.  Physiatrist and rehab team continue to assess barriers to discharge/monitor patient progress toward functional and medical goals  Care Tool:  Bathing    Body parts bathed by patient: Right arm, Left arm, Chest, Abdomen, Front perineal area, Right upper leg, Left upper leg, Face, Right lower leg, Left lower leg   Body parts bathed by helper: Buttocks     Bathing assist Assist Level: Minimal Assistance - Patient > 75%     Upper Body Dressing/Undressing Upper body dressing   What is the patient wearing?: Pull over shirt    Upper body assist Assist Level: Supervision/Verbal cueing    Lower Body Dressing/Undressing Lower body dressing      What is the patient wearing?: Pants     Lower body assist Assist for lower body dressing: Minimal Assistance - Patient > 75%     Toileting Toileting Toileting Activity did not occur (Clothing management and hygiene only): N/A (no void or bm)  Toileting assist Assist for toileting: Maximal Assistance - Patient 25 - 49%     Transfers Chair/bed transfer  Transfers assist     Chair/bed transfer assist level: Minimal Assistance - Patient > 75% Chair/bed transfer assistive device: Programmer, multimedia   Ambulation assist   Ambulation activity did not occur: Safety/medical concerns(fatigue/SOB/R ankle pain)  Assist level: Contact Guard/Touching assist Assistive device: Walker-rolling Max distance: 55'   Walk 10 feet activity   Assist  Walk 10 feet activity did not  occur: Safety/medical concerns(fatigue/SOB/R ankle pain)  Assist level: Contact Guard/Touching assist Assistive device: Walker-rolling   Walk 50 feet activity   Assist Walk 50 feet with 2 turns activity did not occur: Safety/medical concerns(R ankle pain and decreased activity tolerance)  Assist level: Contact Guard/Touching assist Assistive device: Walker-rolling    Walk 150 feet activity   Assist Walk 150 feet activity  did not occur: Safety/medical concerns(R ankle pain and decreased activity tolerance)         Walk 10 feet on uneven surface  activity   Assist Walk 10 feet on uneven surfaces activity did not occur: Safety/medical concerns(R ankle pain and decreased activity tolerance)         Wheelchair     Assist     Wheelchair activity did not occur: Safety/medical concerns(Sternal precautions)         Wheelchair 50 feet with 2 turns activity    Assist    Wheelchair 50 feet with 2 turns activity did not occur: Safety/medical concerns(Sternal precautions)       Wheelchair 150 feet activity     Assist  Wheelchair 150 feet activity did not occur: Safety/medical concerns(Sternal precautions)       Blood pressure (!) 143/74, pulse 67, temperature 98.1 F (36.7 C), temperature source Oral, resp. rate 17, weight 120.1 kg, SpO2 95 %.  Medical Problem List and Plan: 1.Left hemiparesis and decreased mobilitysecondary to right ACA infarct after CABG  Continue CIR PRAFO LLE 2. Antithrombotics: -DVT/anticoagulation:Pharmaceutical:Other (comment)--Eliquis -antiplatelet therapy: On low dose ASA. 3. Pain Management:Oxycodone prn 4. Mood:LCSW to follow for evaluation and support. -antipsychotic agents: N/A 5. Neuropsych: This patientiscapable of making decisions onhisown behalf. 6. Skin/Wound Care:Monitor wound for healing. Continue protein supplement to help promote healing. PRAFO LLE 7. Fluids/Electrolytes/Nutrition:encourage PO  -check labs Monday  10/26- labs still pending- will see by tomorrow 8. CAD s/p CABG:Continue sternal precautions. ON ASA and Lipitor. 9. HTN:BP goal 110-140 to allow for adequate cerebral perfusion.  BP borderline 10/25---observe 10 Leukocytosis:Resolved  11.Diastolic heart failure: Strict I/O and daily weights. On Lasix daily with low dose BB.  -cxr reviewed---bilateral  effusions, no obvious edema Filed Weights   04/02/19 0308 04/03/19 0420 04/04/19 0339  Weight: 120.4 kg 122 kg 120.1 kg    Weights trending up until 10/23.  Pt diuresed quite a bit after IV lasix Friday night.   - 10/25 pt is -315 for last 24 hrs. Still looks a little fluid overloaded   -increase lasix to 20mg  bid  10/26- 4 kg higher in 2 days- will give another 40 mg Lasix x1 and follow closely.  10/27- down 2 kgs- will monitor and wait on more lasix 12. Abnormal LFTs: WNL 10/20  13. Malnutrition: Pre-albumin- 14.1 14. A fib: Monitor for recurrent bradycardia--on Eliquis bid and back on amiodarone and metoprolol daily 15. Persistent hyponatremia:   Sodium 136 on 10/20, labs ordered for Monday 16. OSA/Hypoxia: PFTs with moderate to severe obstructive airway disease--now on Dulera bid.   CPAP   Wean supplemental oxygen as tolerated, continues to require 17. Urinary retention:DC'ed foley---> bladder training.   Ua negative, ucx no growth  Flomax increased on 10/21  Voiding with low pvr's now 18. Acute Blood Loss Anemia  Hb 10.3 on 10/20, labs ordered for Monday 19. Hypoalbuminemia  Supplement initiated on 10/21 20. Acute Gout Flare  Cont allopurinol   Colchicine started on 10/21  Improved--watch with diuresis  10/26- improved 21.  Morbid obesity: Encouraged weight loss    LOS: 8 days  A FACE TO FACE EVALUATION WAS PERFORMED  Shakhia Gramajo 04/04/2019, 9:20 AM

## 2019-04-05 ENCOUNTER — Encounter (HOSPITAL_COMMUNITY): Payer: Medicare Other | Admitting: Psychology

## 2019-04-05 ENCOUNTER — Inpatient Hospital Stay (HOSPITAL_COMMUNITY): Payer: Medicare Other | Admitting: Occupational Therapy

## 2019-04-05 ENCOUNTER — Inpatient Hospital Stay (HOSPITAL_COMMUNITY): Payer: Medicare Other | Admitting: Physical Therapy

## 2019-04-05 ENCOUNTER — Inpatient Hospital Stay (HOSPITAL_COMMUNITY): Payer: Medicare Other

## 2019-04-05 ENCOUNTER — Inpatient Hospital Stay (HOSPITAL_COMMUNITY): Payer: Medicare Other | Admitting: Speech Pathology

## 2019-04-05 DIAGNOSIS — F411 Generalized anxiety disorder: Secondary | ICD-10-CM

## 2019-04-05 NOTE — Progress Notes (Signed)
Speech Language Pathology Daily Session Note  Patient Details  Name: Martin [name] MRN: [id number] Date of Birth: 11-02-1955  Today's Date: 04/05/2019 SLP Individual Time: 1130-1159 SLP Individual Time Calculation (min): 29 min  Short Term Goals: Week 2: SLP Short Term Goal 1 (Week 2): STG = LTG due to remaining LOS  Skilled Therapeutic Interventions: Pt was seen for skilled ST targeting cognitive skills. Pt verbally recalled previously learned compensatory memory strategies with Min A verbal cues for use of written aid for 3/4 strategies. He also recalled general events of other therapies this morning as well as therapist's names with Supervision A for use of schedule as aid. SLP further facilitated session with introduction of 1 additional compensatory memory strategy - chunking - which pt utilized to recall 12 grocery items from 5 different categories with Min A verbal cues. Pt also located said items in a grocery ad with overall Min A verbal cues for organization/searching the ad in a methodical/logical aid. Prior to end of session, SLP assisted pt with standing (from wheelchair) to relieve buttock aching/pressure, during which he required a verbal cue to push off of knees as opposed to grabbing walker to adhere to sternal precautions. Pt left sitting in wheelchair with alarm set and all needs within reach. Continue per current plan of care.      Pain Pain Assessment Pain Scale: Faces Faces Pain Scale: Hurts a little bit Pain Type: Acute pain Pain Location: Buttocks Pain Orientation: Mid Pain Descriptors / Indicators: Aching Pain Onset: Other (Comment)(with sitting) Pain Intervention(s): Repositioned Multiple Pain Sites: No  Therapy/Group: Individual Therapy  Arbutus Leas 04/05/2019, 12:30 PM

## 2019-04-05 NOTE — Progress Notes (Signed)
Physical Therapy Session Note  Patient Details  Name: Martin [name] MRN: [id number] Date of Birth: May 26, 1956  Today's Date: 04/05/2019 PT Individual Time: 1450-1535 PT Individual Time Calculation (min): 45 min   Short Term Goals: Week 2:  PT Short Term Goal 1 (Week 2): STG=LTG due to ELOS.   Skilled Therapeutic Interventions/Progress Updates:     Patient in bed on RA, SPO2 96%, upon PT arrival. Patient alert and agreeable to PT session. Patient reported 4/10 low back pain during session, RN made aware and provided Tylenol during session. PT provided repositioning, rest breaks, and distraction as pain interventions throughout session.   Therapeutic Activity: Bed Mobility: Patient performed supine to sit in a flat bed without use of bed rails with min A for LE and trunk managment. Provided verbal cues for sliding LEs off the bed in side lying and bringing B knees to chest and looking down to activate abdominals with sitting up to avoid use of UEs. Transfers: Patient performed sit to/from stand x2 and stand pivot x2 with min A-CGA using a RW. Provided verbal cues for leaning forward and pushing his toes into the ground to prevent posterior lean when standing without UE use.  Gait Training:  Patient ambulated 64 feet and 55 feet using RW with close supervision and w/c follow for safety. Ambulated with decreased gait speed, decreased step length, narrow BOS, increased hip and knee flexion, forward trunk lean, and downward head gaze. Step length decreased and gait speed increased, taking small quick steps, with fatigue. Provided verbal cues for paced breathing, increased step length and height, decreased gait speed with fatigue encouraging paced breathing with paced stepping.  Wheelchair Mobility:  Patient was transported in the w/c with total A throughout session for energy conservation and time management.  Neuromuscular Re-ed: Patient performed blocked practice for sit to/from side  lying x1 on a mat table with min-mod A and x2 to the R and L on a mat table with a large dark red wedge with mod A for LE management to the R and min A on the L. Provided cues for B knee to chest for increased abdominal activation throughout and diaphragmatic breathing, as patient tends to perform the Valsalva maneuver during this activity.   Patient in w/c with his wife in the room at end of session with breaks locked, seat belt alarm set, and all needs within reach. Patient remained on RA with SPO2 >91% throughout session both with activity and at rest.    Therapy Documentation Precautions:  Precautions Precautions: Sternal, Fall Precaution Booklet Issued: No Precaution Comments: Lft hemiparesis Restrictions Weight Bearing Restrictions: No Other Position/Activity Restrictions: sternal precautions     Therapy/Group: Individual Therapy  Lalo Tromp L Ander Wamser PT, DPT  04/05/2019, 4:57 PM

## 2019-04-05 NOTE — Progress Notes (Signed)
[place] PHYSICAL MEDICINE & REHABILITATION PROGRESS NOTE   Subjective/Complaints:  Pt reports still jittery but admits having difficulties with anxiety- has been able to come off O2 by [place] since yesterday (nursing felt anxiety played a part in staying on it so long). Still having poor sleep, but admits this is a chronic problem at home- doesn't sleep well (falls asleep fine, but wakes easily) and now not in home setting.  Did receive trazadone last night- slept 3 hrs after it.   ROS: Patient denies fever, rash, sore throat, blurred vision, nausea, vomiting, diarrhea, cough, chest pain, joint or back pain, headache, or mood change.    Objective:    No results found. Recent Labs    04/03/19 1313  WBC 6.2  HGB 11.8*  HCT 37.4*  PLT 243   Recent Labs    04/03/19 1313 04/04/19 0524  NA 138 138  K 3.8 3.6  CL 98 100  CO2 27 31  GLUCOSE 110* 134*  BUN 14 14  CREATININE 0.79 0.84  CALCIUM 9.2 9.1    Intake/Output Summary (Last 24 hours) at 04/05/2019 0930 Last data filed at 04/05/2019 0700 Gross per 24 hour  Intake 820 ml  Output 1750 ml  Net -930 ml     Physical Exam: Vital Signs Blood pressure (!) 144/96, pulse 72, temperature 98.8 F (37.1 C), temperature source Oral, resp. rate 17, weight 118.9 kg, SpO2 94 %. Constitutional: No distress . Vital signs and labs reviewed. Sitting up in manual w/c at bedside; watching TV, appropriate; off O2, but appears a little anxious, esp when mentioned it, NAD HEENT: EOMI, conjugate gaze Neck: supple Cardiovascular: RRR without murmur. No JVD    Respiratory: CTA Bilaterally. Normal effort   Off O2; no wheezes, rales or rhonchi GI: BS +, non-tender, non-distended  Skin: Healing abrasions bilateral LE's Psych: slightly anxious. Musc: LLE edema 1+, tr RLE Right ankle pain improved Neurological: Alert Motor: LUE 4-4+/5 proximal to distal.  LLE: 3+/5 HF, 4-/5KE and 0/5 ADF RUE: 5/5 proximal to distal, stable RLE: 4-4+/5  proximal to distal.   Assessment/Plan: 1. Functional deficits secondary to right ACA infarct which require 3+ hours per day of interdisciplinary therapy in a comprehensive inpatient rehab setting.  Physiatrist is providing close team supervision and 24 hour management of active medical problems listed below.  Physiatrist and rehab team continue to assess barriers to discharge/monitor patient progress toward functional and medical goals  Care Tool:  Bathing    Body parts bathed by patient: Right arm, Left arm, Chest, Abdomen, Front perineal area, Right upper leg, Left upper leg, Face, Right lower leg, Left lower leg   Body parts bathed by helper: Buttocks     Bathing assist Assist Level: Minimal Assistance - Patient > 75%     Upper Body Dressing/Undressing Upper body dressing   What is the patient wearing?: Pull over shirt    Upper body assist Assist Level: Supervision/Verbal cueing    Lower Body Dressing/Undressing Lower body dressing      What is the patient wearing?: Pants     Lower body assist Assist for lower body dressing: Supervision/Verbal cueing     Toileting Toileting Toileting Activity did not occur (Clothing management and hygiene only): N/A (no void or bm)  Toileting assist Assist for toileting: Maximal Assistance - Patient 25 - 49%     Transfers Chair/bed transfer  Transfers assist     Chair/bed transfer assist level: Minimal Assistance - Patient > 75% Chair/bed transfer assistive  device: Museum/gallery exhibitions officer assist   Ambulation activity did not occur: Safety/medical concerns(fatigue/SOB/R ankle pain)  Assist level: Contact Guard/Touching assist Assistive device: Walker-rolling Max distance: 100 ft   Walk 10 feet activity   Assist  Walk 10 feet activity did not occur: Safety/medical concerns(fatigue/SOB/R ankle pain)  Assist level: Contact Guard/Touching assist Assistive device: Walker-rolling   Walk 50 feet  activity   Assist Walk 50 feet with 2 turns activity did not occur: Safety/medical concerns(R ankle pain and decreased activity tolerance)  Assist level: Contact Guard/Touching assist Assistive device: Walker-rolling    Walk 150 feet activity   Assist Walk 150 feet activity did not occur: Safety/medical concerns(R ankle pain and decreased activity tolerance)         Walk 10 feet on uneven surface  activity   Assist Walk 10 feet on uneven surfaces activity did not occur: Safety/medical concerns(R ankle pain and decreased activity tolerance)         Wheelchair     Assist     Wheelchair activity did not occur: Safety/medical concerns(Sternal precautions)         Wheelchair 50 feet with 2 turns activity    Assist    Wheelchair 50 feet with 2 turns activity did not occur: Safety/medical concerns(Sternal precautions)       Wheelchair 150 feet activity     Assist  Wheelchair 150 feet activity did not occur: Safety/medical concerns(Sternal precautions)       Blood pressure (!) 144/96, pulse 72, temperature 98.8 F (37.1 C), temperature source Oral, resp. rate 17, weight 118.9 kg, SpO2 94 %.  Medical Problem List and Plan: 1.Left hemiparesis and decreased mobilitysecondary to right ACA infarct after CABG  Continue CIR PRAFO LLE 2. Antithrombotics: -DVT/anticoagulation:Pharmaceutical:Other (comment)--Eliquis -antiplatelet therapy: On low dose ASA. 3. Pain Management:Oxycodone prn 4. Mood:LCSW to follow for evaluation and support. -antipsychotic agents: N/A 5. Neuropsych: This patientiscapable of making decisions onhisown behalf. 6. Skin/Wound Care:Monitor wound for healing. Continue protein supplement to help promote healing. PRAFO LLE 7. Fluids/Electrolytes/Nutrition:encourage PO  -check labs Monday  10/26- labs still pending- will see by tomorrow 8. CAD s/p CABG:Continue  sternal precautions. ON ASA and Lipitor. 9. HTN:BP goal 110-140 to allow for adequate cerebral perfusion.  BP borderline 10/25---observe  10/28- will maintain BP due to goal for cerebral perfusion 10 Leukocytosis:Resolved  11.Diastolic heart failure: Strict I/O and daily weights. On Lasix daily with low dose BB.  -cxr reviewed---bilateral effusions, no obvious edema Filed Weights   04/03/19 0420 04/04/19 0339 04/05/19 0452  Weight: 122 kg 120.1 kg 118.9 kg    Weights trending up until 10/23.  Pt diuresed quite a bit after IV lasix Friday night.   - 10/25 pt is -315 for last 24 hrs. Still looks a little fluid overloaded   -increase lasix to 20mg  bid  10/26- 4 kg higher in 2 days- will give another 40 mg Lasix x1 and follow closely.  10/27- down 2 kgs- will monitor and wait on more lasix  10/28- down another 1+ kg- maintain- will recheck labs in AM 12. Abnormal LFTs: WNL 10/20  13. Malnutrition: Pre-albumin- 14.1 14. A fib: Monitor for recurrent bradycardia--on Eliquis bid and back on amiodarone and metoprolol daily 15. Persistent hyponatremia:   Sodium 136 on 10/20, labs ordered for Monday 16. OSA/Hypoxia: PFTs with moderate to severe obstructive airway disease--now on Dulera bid.   CPAP   Wean supplemental oxygen as tolerated, continues to require  10/28- off O2 by  [place] 17. Urinary retention:DC'ed foley---> bladder training.   Ua negative, ucx no growth  Flomax increased on 10/21  Voiding with low pvr's now 18. Acute Blood Loss Anemia  Hb 10.3 on 10/20, labs ordered for Monday 19. Hypoalbuminemia  Supplement initiated on 10/21 20. Acute Gout Flare  Cont allopurinol   Colchicine started on 10/21  Improved--watch with diuresis  10/26- improved 21.  Morbid obesity: Encouraged weight loss    LOS: 9 days A FACE TO FACE EVALUATION WAS PERFORMED  Jesseca Marsch 04/05/2019, 9:30 AM

## 2019-04-05 NOTE — Progress Notes (Signed)
Occupational Therapy Session Note  Patient Details  Name: SECUNDINO ELLITHORPE MRN: [id number] Date of Birth: [birth year]  Today's Date: 04/05/2019 OT Individual Time: 1310-1355 OT Individual Time Calculation (min): 45 min    Short Term Goals: Week 1:  OT Short Term Goal 1 (Week 1): Pt will complete stand pivot transfers with LRAD with min assist OT Short Term Goal 1 - Progress (Week 1): Met OT Short Term Goal 2 (Week 1): Pt will complete toileting tasks with min assist OT Short Term Goal 2 - Progress (Week 1): Met OT Short Term Goal 3 (Week 1): Pt will complete bathing with min assist with AE as needed OT Short Term Goal 3 - Progress (Week 1): Met OT Short Term Goal 4 (Week 1): Pt will complete LB dressing with min assist with AE as needed OT Short Term Goal 4 - Progress (Week 1): Met OT Short Term Goal 5 (Week 1): Pt will complete 1 grooming activity in standing, with CGA for standing balance, to demonstrate increased standing tolerance OT Short Term Goal 5 - Progress (Week 1): Met Week 2:  OT Short Term Goal 1 (Week 2): STG=LTG secondary to ELOS  Skilled Therapeutic Interventions/Progress Updates:    1:1 Pt in w/c. Transported to the gym and transfer with RW to the mat with min guard. Began to work on core strengthening and endurance with modified crunches on wedge sitting EOB but them pt reported toileting urgency. Pt ambulated ~ 40 feet with RW in the hallway before needed to rest on the return to the room. Ambulated from door to bathroom with BSC over the commode with contact guard. Pt required max A for toileting including hygiene and pants down (due to urgency). Pt did trial out tongs for hygiene but still require A to endure cleanliness. Pt changed pants with min A with reacher and extra time. Pt ambulated out to the w/c at the sink to wash hands. Pt then transferred back into bed stand pivot with RW. Pt required A to get both LEs in the bed and still ended up sideways in bed requiring  mod A to adjust.   Left resting in bed with wife.   Therapy Documentation Precautions:  Precautions Precautions: Sternal, Fall Precaution Booklet Issued: No Precaution Comments: Lft hemiparesis Restrictions Weight Bearing Restrictions: No Other Position/Activity Restrictions: sternal precautions  Pain: No c/o pain in session but did c/o fatigue  Therapy/Group: Individual Therapy  Willeen Cass [facility] 04/05/2019, 3:20 PM

## 2019-04-05 NOTE — Consult Note (Signed)
Neuropsychological Consultation   Patient:   Martin [name]   DOB:   1955-10-30  MR Number:  [id number]  Location:  [facility] [address] C[facility] [address] [place] [id number] [place] [address] Dept: [place]: [phone]           Date of Service:   04/05/2019  Start Time:   10 AM End Time:   11 AM  Provider/Observer:  Ilean Skill, Psy.D.       Clinical Neuropsychologist       Billing Code/Service: [id number]  Chief Complaint:    Sender Starr. Rousselle is a 63 year old male with a history of CAD/PAF, OSA who was admitted on 03/07/2019 with CP and had mild elevations in troponins with stable EKG changes concerning for NSTEMI.  The patient underwent cardiac cath revealing severe 3 V CAD and underwent CABG x3 on 04/07/2019.  He tolerated extubation without difficulty but had confusion and was noted to have left-sided weakness on 10/5.  CT head done revealing small acute cortical distal right ACA territory infarct and progressive small vessel disease.  Follow-up CT was without bleed and therefore heparin was added on 10/6 and transition to Eliquis by 10/8.  EEG was done due to somnolence and was negative for seizure.  The patient has been dealing with significant debility and he c had continued to require oxygen up until recently.  The patient has gaining strength and actively working on rehabilitation efforts within the CIR.  Reason for Service:  The patient was referred for neuropsychological consultation due to anxiety and coping issues.  Below is the HPI for the current admission.  [id number] Martin [name] is a 63 year old male with history of CAD/PAF-on Eliquis, OSA, who was admitted on 03/07/19 with CP and had mild elevation in troponins with subtle EKG changes concerning for NSTEMI. 2D echo showed mild increase in LVH with basal inferior akinesis and EF 55-60%. He underwent cardiac cath revealing severe 3V CAD and  underwent CABG X 3 by Dr. Prescott Gum on 03/11/19. He tolerated extubation with out difficulty but had confusion and was noted to have left sided weakness on 10/5 am. CT head done revealing small acute cortical distal R-ACA territory infarct and progressive small vessel disease. CTA head/neck was negative for LVO, revealed new focus of calcification in R-ACA distal A2 segment which could indicated partially calcified thrombus, fetal origins PCA as well as incidental findings of large loculated component of left effusion and retrosternal 4.3X 2.0X 5.2 cm fluid collection favored to be seroma. He developed A fib with RVR as well as VT and was started on amiodarone on 10/5. Cardizem added for rate control and due to low BP maintained on milrinone due to fluid overload. Dr. Erlinda Hong recommends BP goal 110-140 to maintain adequate cerebral perfusion.   Follow up CT head without bleed therefore heparin added on 10/06 and transitioned to Eliquis by 10/08. EEG done due to somnolence and was negative for seizures. He has had issues with urinary retention requiring I/O caths and foley replaced. He did develop progressive leucocytosis with WBC- 20.1 on 10/13 thereforestarted ondoxycycline andCiproadded 10/15.UCS negative and CXR done revealing stable bilateral effusion snd stable nodular density left mid lung possibly hematoma at side of prior chest tube.Leucocytosis resolving but 10/15 pm, he did have unresponsive episode with seizure type episode and 14.5 seconds asystole felt to be vasovagal in nature. Amiodaroneand BB d/c briefly due to bradycardia and ongoing A fib--as HR stable  amiodarone resumed 10/18 and low dose BB resumed today.He continue to require oxygen per [place] due to and is noted to be limited by left hemiparesis with shuffling gait and SOB with activity. CIR recommended due to functional decline.  Current Status:  The patient reports that he has been dealing with anxiety for many years does appear to  have a generalized anxiety disorder.  However, acute medical issues both cardiovascularly as well as cerebrovascular issues related to his recent stroke have magnified his anxiety.  The patient reports that his wife is not able to pick him up or assist in significant ways other than being there for him.  He is worried about his ability to manage his health effectively although he only has a couple of significant steps that he has to navigate within the house.  The patient lives in [place] and will likely need transportation assist to get to rehab over at [place] Observation: Martin [name]  presents as a 63 y.o.-year-old Right Caucasian Male who appeared his stated age. his dress was Appropriate and he was Well Groomed and his manners were Appropriate to the situation.  his participation was indicative of Appropriate and Attentive behaviors.  There were any physical disabilities noted.  he displayed an appropriate level of cooperation and motivation.     Interactions:    Active Appropriate and Attentive  Attention:   within normal limits and attention span and concentration were age appropriate  Memory:   within normal limits; recent and remote memory intact  Visuo-spatial:  not examined  Speech (Volume):  normal  Speech:   normal; normal  Thought Process:  Coherent and Relevant  Though Content:  WNL; not suicidal and not homicidal  Orientation:   person, place, time/date and situation  Judgment:   Good  Planning:   Good  Affect:    Anxious  Mood:    Anxious  Insight:   Good  Intelligence:   normal  Medical History:   Past Medical History:  Diagnosis Date  . Atrial fibrillation ([place])    a. dx 10/2017. b. s/p DCCV on 11/19/2017 with successful conversion to NSR but back in AFIB 3 weeks later  . CAD (coronary artery disease)    a. s/p PCI to LAD 2001. b. s/p stenting to the RCA and mid LAD December 2011, residual distal RCA disease treated  medically. b. nuc 02/2013 abnormal with fixed defect seen in inferoapical, mid-inferior, and basal inferior walls, indicative of myocardial scar, no evidence of ischemia, EF 41% (Ef 55-60% by echo same time).  . Carotid artery disease ([place])    carotid Doppler course left side 5069% stenosis  . Dysrhythmia    AFib  . Habitual alcohol use   . Hyperlipidemia   . Hypertension   . Low back pain   . OSA on CPAP   . Osteoarthritis   . Polysubstance abuse ([place])    use including marijuana and vicodin,which he obtained from the street  . PVC's (premature ventricular contractions)    status post Holter monitor normal sinus rhythm otherwise asymptomatic PVCs.  . Seasonal allergies   Psychiatric History:  The patient does report a history of anxiety through the years that are likely related to a generalized anxiety disorder.  Family Med/Psych History:  Family History  Problem Relation Age of Onset  . Stroke Mother   . Other Mother        small bowel obstruction  . Heart attack Father   .  Heart attack Brother     Impression/DX:  45 M. Schiesser is a 63 year old male with a history of CAD/PAF, OSA who was admitted on 03/07/2019 with CP and had mild elevations in troponins with stable EKG changes concerning for NSTEMI.  The patient underwent cardiac cath revealing severe 3 V CAD and underwent CABG x3 on 04/07/2019.  He tolerated extubation without difficulty but had confusion and was noted to have left-sided weakness on 10/5.  CT head done revealing small acute cortical distal right ACA territory infarct and progressive small vessel disease.  Follow-up CT was without bleed and therefore heparin was added on 10/6 and transition to Eliquis by 10/8.  EEG was done due to somnolence and was negative for seizure.  The patient has been dealing with significant debility and he c had continued to require oxygen up until recently.  The patient has gaining strength and actively working on rehabilitation efforts  within the CIR.  The patient reports that he has been dealing with anxiety for many years does appear to have a generalized anxiety disorder.  However, acute medical issues both cardiovascularly as well as cerebrovascular issues related to his recent stroke have magnified his anxiety.  The patient reports that his wife is not able to pick him up or assist in significant ways other than being there for him.  He is worried about his ability to manage his health effectively although he only has a couple of significant steps that he has to navigate within the house.  The patient lives in [place] and will likely need transportation assist to get to rehab over at [facility]   Disposition/Plan:  Worked on coping and adjustment issues particular around his residual effects of his stroke and debility.  Patient has significant anxiety but this is improving as his overall function has improved.  I will follow-up with the patient next week.  Diagnosis:    Debility - Plan: Ambulatory referral to Physical Medicine Rehab  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 ([place]) - Plan: Ambulatory referral to Physical Medicine Rehab  SOB (shortness of breath) - Plan: DG Chest 2 View, DG Chest 2 View         Electronically Signed   _______________________ Ilean Skill, Psy.D.

## 2019-04-05 NOTE — Progress Notes (Signed)
Pt stated he could place self on home cpap unit when ready for bed. RT added sterile water to water chamber.

## 2019-04-05 NOTE — Progress Notes (Signed)
Physical Therapy Session Note  Patient Details  Name: Martin [name] MRN: [id number] Date of Birth: May 11, 1956  Today's Date: 04/05/2019 PT Individual Time: 0812-0922 PT Individual Time Calculation (min): 70 min   Short Term Goals: Week 2:  PT Short Term Goal 1 (Week 2): STG=LTG due to ELOS.  Skilled Therapeutic Interventions/Progress Updates:  Pt received in w/c & agreeable to tx. Therapist provides dependent assist for donning teds & shoes total assisst. Pt propels w/c room>gym with BUE & supervision for cardiopulmonary endurance training; pt reports 4/10 soreness in chest that ends with rest - educated pt on limited use of BUE 2/2 sternal precautions & need to defer activity in future. PA Linna Hoff) made aware of activity & pt's response. Pt ambulates ~100 ft x 2 with RW & CGA with decreased weight shifting L<>R, decreased step length BLE & decreased stride length, with cuing for stepping with LLE when turning. With BUE support on RW pt performs 3" step taps with min assist for balance & task focusing on weight shifting L<>R, dynamic balance, and LLE hip strengthening & NMR. Pt with weak LLE hip flexors and attempts to compensate with activity by circumducting LLE during activity. Pt performed 5x sit<>stand x 2 without BUE support & min assist with task focusing on LE strengthening, anterior weight shifting & coming to upright - pt with frequent posterior LOB during activity. Pt performed lateral step ups on 3" step with light BUE support on rail for balance & min assist; activity focused on LLE hip flexor strengthening & NMR, as well as balance. At end of session pt left in w/c with chair alarm donned, all needs in reach.  Pt on room air throughout session with SpO2 >90%. Therapist provides cuing for pursed lip breathing as pt reports SOB intermittently at rest & with activity.   Pt reports unrated chronic back pain & rest breaks provided PRN for pain management.  Therapy  Documentation Precautions:  Precautions Precautions: Sternal, Fall Precaution Booklet Issued: No Precaution Comments: Lft hemiparesis Restrictions Weight Bearing Restrictions: No Other Position/Activity Restrictions: sternal precautions     Therapy/Group: Individual Therapy  Waunita Schooner 04/05/2019, 9:22 AM

## 2019-04-06 ENCOUNTER — Inpatient Hospital Stay (HOSPITAL_COMMUNITY): Payer: Medicare Other | Admitting: Speech Pathology

## 2019-04-06 ENCOUNTER — Inpatient Hospital Stay (HOSPITAL_COMMUNITY): Payer: Medicare Other

## 2019-04-06 LAB — CBC WITH DIFFERENTIAL/PLATELET
Abs Immature Granulocytes: 0 10*3/uL (ref 0.00–0.07)
Basophils Absolute: 0 10*3/uL (ref 0.0–0.1)
Basophils Relative: 1 %
Eosinophils Absolute: 0.2 10*3/uL (ref 0.0–0.5)
Eosinophils Relative: 5 %
HCT: 33.7 % — ABNORMAL LOW (ref 39.0–52.0)
Hemoglobin: 10.9 g/dL — ABNORMAL LOW (ref 13.0–17.0)
Immature Granulocytes: 0 %
Lymphocytes Relative: 26 %
Lymphs Abs: 1.2 10*3/uL (ref 0.7–4.0)
MCH: 31.4 pg (ref 26.0–34.0)
MCHC: 32.3 g/dL (ref 30.0–36.0)
MCV: 97.1 fL (ref 80.0–100.0)
Monocytes Absolute: 0.4 10*3/uL (ref 0.1–1.0)
Monocytes Relative: 9 %
Neutro Abs: 2.7 10*3/uL (ref 1.7–7.7)
Neutrophils Relative %: 59 %
Platelets: 205 10*3/uL (ref 150–400)
RBC: 3.47 MIL/uL — ABNORMAL LOW (ref 4.22–5.81)
RDW: 13.5 % (ref 11.5–15.5)
WBC: 4.5 10*3/uL (ref 4.0–10.5)
nRBC: 0 % (ref 0.0–0.2)

## 2019-04-06 LAB — BASIC METABOLIC PANEL
Anion gap: 9 (ref 5–15)
BUN: 12 mg/dL (ref 8–23)
CO2: 27 mmol/L (ref 22–32)
Calcium: 9.1 mg/dL (ref 8.9–10.3)
Chloride: 102 mmol/L (ref 98–111)
Creatinine, Ser: 0.81 mg/dL (ref 0.61–1.24)
GFR calc Af Amer: >60 mL/min (ref >60)
GFR calc non Af Amer: >60 mL/min (ref >60)
Glucose, Bld: 115 mg/dL — ABNORMAL HIGH (ref 70–99)
Potassium: 3.5 mmol/L (ref 3.5–5.1)
Sodium: 138 mmol/L (ref 135–145)

## 2019-04-06 LAB — BRAIN NATRIURETIC PEPTIDE: B Natriuretic Peptide: 196.9 pg/mL — ABNORMAL HIGH (ref 0.0–100.0)

## 2019-04-06 MED ORDER — POTASSIUM CHLORIDE CRYS ER 20 MEQ PO TBCR: 40.0000 meq | EXTENDED_RELEASE_TABLET | Freq: Once | ORAL | Status: DC

## 2019-04-06 MED ORDER — POTASSIUM CHLORIDE CRYS ER 20 MEQ PO TBCR
30.0000 meq | EXTENDED_RELEASE_TABLET | Freq: Every day | ORAL | Status: DC
  Administered 2019-04-07 – 2019-04-11 (×5): 30 meq via ORAL
  Filled 2019-04-06 (×5): qty 1

## 2019-04-06 NOTE — Plan of Care (Signed)
  Problem: Consults Goal: RH STROKE PATIENT EDUCATION Description: See Patient Education module for education specifics  Outcome: Progressing Goal: Nutrition Consult-if indicated Outcome: Progressing   Problem: RH BOWEL ELIMINATION Goal: RH STG MANAGE BOWEL WITH ASSISTANCE Description: STG Manage Bowel with mod I Assistance. Outcome: Progressing Goal: RH STG MANAGE BOWEL W/MEDICATION W/ASSISTANCE Description: STG Manage Bowel with Medication with mod I Assistance. Outcome: Progressing   Problem: RH BLADDER ELIMINATION Goal: RH STG MANAGE BLADDER WITH ASSISTANCE Description: STG Manage Bladder With mod I Assistance Outcome: Progressing Goal: RH STG MANAGE BLADDER WITH MEDICATION WITH ASSISTANCE Description: STG Manage Bladder With Medication With mod I Assistance. Outcome: Progressing Goal: RH STG MANAGE BLADDER WITH EQUIPMENT WITH ASSISTANCE Description: STG Manage Bladder With Equipment With mod I Assistance Outcome: Progressing   Problem: RH SKIN INTEGRITY Goal: RH STG SKIN FREE OF INFECTION/BREAKDOWN Outcome: Progressing Goal: RH STG MAINTAIN SKIN INTEGRITY WITH ASSISTANCE Description: STG Maintain Skin Integrity With min Assistance. Outcome: Progressing   Problem: RH PAIN MANAGEMENT Goal: RH STG PAIN MANAGED AT OR BELOW PT'S PAIN GOAL Description: Less than 3 out of 10 Outcome: Progressing   Problem: RH KNOWLEDGE DEFICIT Goal: RH STG INCREASE KNOWLEDGE OF HYPERTENSION Description: Patient able to state 2 methods of controlling high blood pressure Outcome: Progressing Goal: RH STG INCREASE KNOWLEGDE OF HYPERLIPIDEMIA Description: Pt will be able to demonstrate medication compliance and diet regimen to manage HLD Outcome: Progressing Goal: RH STG INCREASE KNOWLEDGE OF STROKE PROPHYLAXIS Description: Pt will be able to verbalize medication understanding and 3 signs of stroke to prevent stroke reoccuring Outcome: Progressing   Problem: RH SKIN INTEGRITY Goal: RH STG  ABLE TO PERFORM INCISION/WOUND CARE W/ASSISTANCE Description: STG Able To Perform Incision/Wound Care With min Assistance. Outcome: Not Applicable

## 2019-04-06 NOTE — Progress Notes (Signed)
Pt can place self on home cpap unit. RT added sterile water to water chamber. RT will continue to monitor as needed.

## 2019-04-06 NOTE — Progress Notes (Signed)
Social Work Patient ID: Martin [name], male   DOB: 21-Sep-1955, 63 y.o.   MRN: [id number]   Have reviewed team conference with pt who is pleased with progress, however, he still feels "anxious" at times.  He feels good about time he had yesterday with neuropsychologist and "tips I learned to deal with it."   Pt interested in possible OP tx and I will check if any transport assist might be available.  Continue to follow.  Colbie Danner, LCSW

## 2019-04-06 NOTE — Progress Notes (Signed)
Physical Therapy Session Note  Patient Details  Name: Martin [name] MRN: [id number] Date of Birth: 1955-09-25  Today's Date: 04/06/2019 PT Individual Time: 1300-1415 PT Individual Time Calculation (min): 75 min   Short Term Goals: Week 2:  PT Short Term Goal 1 (Week 2): STG=LTG due to ELOS.  Skilled Therapeutic Interventions/Progress Updates:     Patient in w/c on RA upon PT arrival. Patient alert and agreeable to PT session. Caryl Pina, RN present throughout session for orientation and transfer training. Patient reported that he has not been sleeping well and that he only slept 1.5-2 hours last night and felt fatigued this afternoon. He denied pain throughout session.   Therapeutic Activity: Bed Mobility: Patient performed supine to/from sit x4 with min A-CGA with HOB elevated to simulate home set up with elevating HOB. Provided verbal cues for abdominal activation for LE management, breathing throughout as patient tends to perform Valsalva maneuver during bed mobility, and sitting up without use of UEs to maintain sternal precautions.  Transfers: Patient performed sit to/from stand x3 and stand pivot x3 with CGA using a RW. Required 2 attempts to stand x1 due to fatigue and posterior lean. Provided verbal cues for leaning far forward to stand, no use of UEs to maintain sternal precautions, and pushing his toes into the floor to reduce posterior lean when standing.  Gait Training:  Patient ambulated 68 feet with 1 short standing rest break, 118 feet with 2 short standing rest breaks, 92 feet with 1 short standing rest break, and 33 feet using RW with CGA and w/c follow due to decreased endurance. Ambulated with narrow BOS, small quick steps, forward trunk lean, downward head gaze, and increased L toe out. Provided verbal cues for erect posture, paced pursed lip breathing, and increased step length with decreased gait speed for improved balance and pacing with gait.  Wheelchair Mobility:   Patient was transported in the w/c with total A throughout session for energy conservation and time management.  Patient in bed at end of session with breaks locked, bed alarm set, and all needs within reach. Provided seated rest breaks between all activities due to SOB/fatigue. Patient remained on RA throughout session, SPO2 >90% throughout.    Therapy Documentation Precautions:  Precautions Precautions: Sternal, Fall Precaution Booklet Issued: No Precaution Comments: Lft hemiparesis Restrictions Weight Bearing Restrictions: No Other Position/Activity Restrictions: sternal precautions     Therapy/Group: Individual Therapy  Ebba Goll L Irina Okelly PT, DPT  04/06/2019, 4:26 PM

## 2019-04-06 NOTE — Evaluation (Signed)
Recreational Therapy Assessment and Plan  Patient Details  Name: Martin [name] MRN: [id number] Date of Birth: 06-12-1955 Today's Date: 04/06/2019  Rehab Potential: Good ELOS: discharge 11/3  Assessment Problem List:      Patient Active Problem List   Diagnosis Date Noted  . Debility 03/27/2019  . 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 ([place]) 03/27/2019  . Lethargy   . Persistent atrial fibrillation ([place])   . Cerebral embolism with cerebral infarction 03/13/2019  . S/P CABG x 3 03/10/2019  . NSTEMI (non-ST elevated myocardial infarction) ([place]) 03/07/2019  . Unstable angina ([place]) 03/07/2019  . OSA on CPAP 05/27/2016  . Obesity 06/10/2011  . Insomnia 06/10/2011  . PVC's (premature ventricular contractions)   . CAD (coronary artery disease)   . Carotid artery disease ([place])   . DEGENERATIVE JOINT DISEASE 07/28/2010  . Dyslipidemia 06/17/2010  . SUBSTANCE ABUSE, MULTIPLE 06/17/2010  . Essential hypertension 06/17/2010  . CAD 06/17/2010  . BRADYCARDIA 06/17/2010    Past Medical History:      Past Medical History:  Diagnosis Date  . Atrial fibrillation ([place])    a. dx 10/2017. b. s/p DCCV on 11/19/2017 with successful conversion to NSR but back in AFIB 3 weeks later  . CAD (coronary artery disease)    a. s/p PCI to LAD 2001. b. s/p stenting to the RCA and mid LAD December 2011, residual distal RCA disease treated medically. b. nuc 02/2013 abnormal with fixed defect seen in inferoapical, mid-inferior, and basal inferior walls, indicative of myocardial scar, no evidence of ischemia, EF 41% (Ef 55-60% by echo same time).  . Carotid artery disease ([place])    carotid Doppler course left side 5069% stenosis  . Dysrhythmia    AFib  . Habitual alcohol use   . Hyperlipidemia   . Hypertension   . Low back pain   . OSA on CPAP   . Osteoarthritis   . Polysubstance abuse ([place])    use including marijuana and vicodin,which he  obtained from the street  . PVC's (premature ventricular contractions)    status post Holter monitor normal sinus rhythm otherwise asymptomatic PVCs.  . Seasonal allergies    Past Surgical History:       Past Surgical History:  Procedure Laterality Date  . CARDIOVERSION N/A 11/19/2017   Procedure: CARDIOVERSION;  Surgeon: Herminio Commons, MD;  [facility];  Service: Cardiovascular;  Laterality: N/A;  . CORONARY ANGIOPLASTY WITH STENT PLACEMENT  2011  . CORONARY ARTERY BYPASS GRAFT N/A 03/10/2019   Procedure: CORONARY ARTERY BYPASS GRAFTING (CABG)X 3 Using Left Internal Mammary Artery and Right Saphenous Vein Grafts;  Surgeon: Ivin Poot, MD;  Location: [facility];  Service: Open Heart Surgery;  Laterality: N/A;  . LEFT HEART CATH AND CORONARY ANGIOGRAPHY N/A 03/08/2019   Procedure: LEFT HEART CATH AND CORONARY ANGIOGRAPHY;  Surgeon: Belva Crome, MD;  Location: [facility];  Service: Cardiovascular;  Laterality: N/A;  . TEE WITHOUT CARDIOVERSION N/A 03/10/2019   Procedure: TRANSESOPHAGEAL ECHOCARDIOGRAM (TEE);  Surgeon: Prescott Gum, Collier Salina, MD;  Location: [facility];  Service: Open Heart Surgery;  Laterality: N/A;  . TONSILLECTOMY      Assessment & Plan Clinical Impression: Patient is a 63 y.o. year old male with history of CAD/PAF-on Eliquis, OSA, who was admitted on 03/07/19 with CP and had mild elevation in troponins with subtle EKG changes concerning for NSTEMI. 2D echo showed mild increase in LVH with basal inferior akinesis and EF  55-60%. He underwent cardiac cath revealing severe 3V CAD and underwent CABG X 3 by Dr. Prescott Gum on 03/11/19. He tolerated extubation with out difficulty but had confusion and was noted to have left sided weakness on 10/5 am. CT head done revealing small acute cortical distal R-ACA territory infarct and progressive small vessel disease. CTA head/neck was negative for LVO, revealed new focus of calcification in R-ACA distal A2  segment which could indicated partially calcified thrombus, fetal origins PCA as well as incidental findings of large loculated component of left effusion and retrosternal 4.3X 2.0X 5.2 cm fluid collection favored to be seroma. He developed A fib with RVR as well as VT and was started on amiodarone on 10/5. Cardizem added for rate control and due to low BP maintained on milrinone due to fluid overload. Dr. Erlinda Hong recommends BP goal 110-140 to maintain adequate cerebral perfusion.   Follow up CT head without bleed therefore heparin added on 10/06 and transitioned to Eliquis by 10/08. EEG done due to somnolence and was negative for seizures. He has had issues with urinary retention requiring I/O caths and foley replaced. He did develop progressive leucocytosis with WBC- 20.1 on 10/13 thereforestarted ondoxycycline andCiproadded 10/15.UCS negative and CXR done revealing stable bilateral effusion snd stable nodular density left mid lung possibly hematoma at side of prior chest tube.Leucocytosis resolving but 10/15 pm, he did have unresponsive episode with seizure type episode and 14.5 seconds asystole felt to be vasovagal in nature. Amiodaroneand BB d/c briefly due to bradycardia and ongoing A fib--as HR stable amiodarone resumed 10/18 and low dose BB resumed today.He continue to require oxygen per  due to and is noted to be limited by left hemiparesis with shuffling gait and SOB with activity. CIR recommended due to functional decline. Patient transferred to CIR on 03/27/2019 .   Pt presents with decreased activity tolerance, decreased functional mobility, decreased balance, decreased attention, decreased awareness, decreased problem solving, decreased leisure awareness Limiting pt's independence with leisure/community pursuits.  Session focused today on leisure education, simple relaxation training technique (diaphragmatic breathing) and discharge planning.  Pt stated understanding of the above and  plans to use relaxation training at bedtime in preparation for sleep as he reports difficulty sleeping in the hospital.  Pt  Stated he is prepared for discharge home with family and plans to spend the first day or so resting as he feels his lack of sleep is impacting his physical abilities in therapy.  Encouraged physical activity at discharge and identification of leisure tasks to assist with maintaining physical, emotional, social & physical wellness.   Leisure History/Participation Premorbid leisure interest/current participation: Medical laboratory scientific officer - Travel (Comment) Premorbid leisure interest/no interest in trying: Nature - Vegetable gardening;Community - Travel (Comment);Joretta Bachelor care Leisure Participation Style: With Family/Friends Awareness of Community Resources: Fair-identify 2 post discharge leisure resources Psychosocial / Spiritual Does patient have pets?: No Social interaction - Mood/Behavior: Cooperative Strengths/Weaknesses Patient Strengths/Abilities: Active premorbidly Patient weaknesses: Physical limitations;Minimal Premorbid Leisure Activity TR Patient demonstrates impairments in the following area(s): Edema;Endurance;Motor;Pain;Skin Integrity  Plan Rec Therapy Plan TR Treatment/Interventions: Patient/family education No further TR as pt is expected to discharge 11/3  Recommendations for other services: Neuropsych  Discharge Criteria: Patient will be discharged from TR if patient refuses treatment 3 consecutive times without medical reason.  If treatment goals not met, if there is a change in medical status, if patient makes no progress towards goals or if patient is discharged from hospital.  The above assessment, treatment plan, treatment alternatives  and goals were discussed and mutually agreed upon: by patient  Madd[place] 04/06/2019, 3:15 PM

## 2019-04-06 NOTE — Progress Notes (Signed)
[facility] PHYSICAL MEDICINE & REHABILITATION PROGRESS NOTE   Subjective/Complaints:  Pt reports still cannot sleep- at home, didn't really take sleep aids much, however insomnia was an issue.  Still off O2- still having difficulties with LE edema. Wearing TEDs.  ROS: Patient denies fever, rash, sore throat, blurred vision, nausea, vomiting, diarrhea, cough, chest pain, joint or back pain, headache, or mood change.    Objective:    No results found. Recent Labs    04/03/19 1313 04/06/19 0600  WBC 6.2 4.5  HGB 11.8* 10.9*  HCT 37.4* 33.7*  PLT 243 205   Recent Labs    04/04/19 0524 04/06/19 0600  NA 138 138  K 3.6 3.5  CL 100 102  CO2 31 27  GLUCOSE 134* 115*  BUN 14 12  CREATININE 0.84 0.81  CALCIUM 9.1 9.1    Intake/Output Summary (Last 24 hours) at 04/06/2019 0909 Last data filed at 04/06/2019 0720 Gross per 24 hour  Intake 880 ml  Output 1250 ml  Net -370 ml     Physical Exam: Vital Signs Blood pressure (!) 142/75, pulse 63, temperature 98.9 F (37.2 C), temperature source Oral, resp. rate 18, weight 118.9 kg, SpO2 97 %. Constitutional: No distress . Vital signs and labs reviewed. Sitting up at EOB with OT; no O2, NAD; slightly jittery. HEENT: EOMI, conjugate gaze Neck: supple Cardiovascular: RRR without murmur. No JVD    Respiratory: CTA Bilaterally. Normal effort   Off O2; no wheezes, rales or rhonchi still GI: BS +, non-tender, non-distended  Skin: Healing abrasions bilateral LE's Psych: slightly anxious. Musc: LLE edema 2-3+, L>R, but RLE still ~2+ (per pt no worse); TEDS in place Right ankle pain improved Neurological: Alert Motor: LUE 4-4+/5 proximal to distal.  LLE: 3+/5 HF, 4-/5KE and 0/5 ADF RUE: 5/5 proximal to distal, stable RLE: 4-4+/5 proximal to distal.   Assessment/Plan: 1. Functional deficits secondary to right ACA infarct which require 3+ hours per day of interdisciplinary therapy in a comprehensive inpatient rehab  setting.  Physiatrist is providing close team supervision and 24 hour management of active medical problems listed below.  Physiatrist and rehab team continue to assess barriers to discharge/monitor patient progress toward functional and medical goals  Care Tool:  Bathing    Body parts bathed by patient: Right arm, Left arm, Chest, Abdomen, Front perineal area, Right upper leg, Left upper leg, Face, Right lower leg, Left lower leg   Body parts bathed by helper: Buttocks     Bathing assist Assist Level: Minimal Assistance - Patient > 75%     Upper Body Dressing/Undressing Upper body dressing   What is the patient wearing?: Pull over shirt    Upper body assist Assist Level: Set up assist    Lower Body Dressing/Undressing Lower body dressing      What is the patient wearing?: Pants, Underwear/pull up     Lower body assist Assist for lower body dressing: Contact Guard/Touching assist     Toileting Toileting Toileting Activity did not occur (Clothing management and hygiene only): N/A (no void or bm)  Toileting assist Assist for toileting: Maximal Assistance - Patient 25 - 49%     Transfers Chair/bed transfer  Transfers assist     Chair/bed transfer assist level: Minimal Assistance - Patient > 75% Chair/bed transfer assistive device: Programmer, multimedia   Ambulation assist   Ambulation activity did not occur: Safety/medical concerns(fatigue/SOB/R ankle pain)  Assist level: Supervision/Verbal cueing Assistive device: Walker-rolling Max distance: 44'  Walk 10 feet activity   Assist  Walk 10 feet activity did not occur: Safety/medical concerns(fatigue/SOB/R ankle pain)  Assist level: Supervision/Verbal cueing Assistive device: Walker-rolling   Walk 50 feet activity   Assist Walk 50 feet with 2 turns activity did not occur: Safety/medical concerns(R ankle pain and decreased activity tolerance)  Assist level: Supervision/Verbal  cueing Assistive device: Walker-rolling    Walk 150 feet activity   Assist Walk 150 feet activity did not occur: Safety/medical concerns(R ankle pain and decreased activity tolerance)         Walk 10 feet on uneven surface  activity   Assist Walk 10 feet on uneven surfaces activity did not occur: Safety/medical concerns(R ankle pain and decreased activity tolerance)         Wheelchair     Assist     Wheelchair activity did not occur: Safety/medical concerns(Sternal precautions)         Wheelchair 50 feet with 2 turns activity    Assist    Wheelchair 50 feet with 2 turns activity did not occur: Safety/medical concerns(Sternal precautions)       Wheelchair 150 feet activity     Assist  Wheelchair 150 feet activity did not occur: Safety/medical concerns(Sternal precautions)       Blood pressure (!) 142/75, pulse 63, temperature 98.9 F (37.2 C), temperature source Oral, resp. rate 18, weight 118.9 kg, SpO2 97 %.  Medical Problem List and Plan: 1.Left hemiparesis and decreased mobilitysecondary to right ACA infarct after CABG  Continue CIR PRAFO LLE 2. Antithrombotics: -DVT/anticoagulation:Pharmaceutical:Other (comment)--Eliquis -antiplatelet therapy: On low dose ASA. 3. Pain Management:Oxycodone prn 4. Mood:LCSW to follow for evaluation and support. -antipsychotic agents: N/A 5. Neuropsych: This patientiscapable of making decisions onhisown behalf. 6. Skin/Wound Care:Monitor wound for healing. Continue protein supplement to help promote healing. PRAFO LLE 7. Fluids/Electrolytes/Nutrition:encourage PO  -check labs Monday  10/26- labs still pending- will see by tomorrow  10/29- labs look great 8. CAD s/p CABG:Continue sternal precautions. ON ASA and Lipitor. 9. HTN:BP goal 110-140 to allow for adequate cerebral perfusion.  BP borderline 10/25---observe  10/28- will  maintain BP due to goal for cerebral perfusion 10 Leukocytosis:Resolved  11.Diastolic heart failure: Strict I/O and daily weights. On Lasix daily with low dose BB.  -cxr reviewed---bilateral effusions, no obvious edema Filed Weights   04/03/19 0420 04/04/19 0339 04/05/19 0452  Weight: 122 kg 120.1 kg 118.9 kg    Weights trending up until 10/23.  Pt diuresed quite a bit after IV lasix Friday night.   - 10/25 pt is -315 for last 24 hrs. Still looks a little fluid overloaded   -increase lasix to 20mg  bid  10/26- 4 kg higher in 2 days- will give another 40 mg Lasix x1 and follow closely.  10/27- down 2 kgs- will monitor and wait on more lasix  10/28- down another 1+ kg- maintain- will recheck labs in AM 12. Abnormal LFTs: WNL 10/20  13. Malnutrition: Pre-albumin- 14.1 14. A fib: Monitor for recurrent bradycardia--on Eliquis bid and back on amiodarone and metoprolol daily 15. Persistent hyponatremia:   Sodium 136 on 10/20, labs ordered for Monday  Na 138 on 10/29 16. OSA/Hypoxia: PFTs with moderate to severe obstructive airway disease--now on Dulera bid.   CPAP   Wean supplemental oxygen as tolerated, continues to require  10/28- off O2 by [place] 17. Urinary retention:DC'ed foley---> bladder training.   Ua negative, ucx no growth  Flomax increased on 10/21  Voiding with low pvr's now 18. Acute Blood  Loss Anemia  Hb 10.3 on 10/20, labs ordered for Monday 19. Hypoalbuminemia  Supplement initiated on 10/21 20. Acute Gout Flare  Cont allopurinol   Colchicine started on 10/21  Improved--watch with diuresis  10/26- improved 21.  Morbid obesity: Encouraged weight loss  22. Borderline Hypokalemia  10/29- K+ 3.5- has been trending down last few lab draws- will give 1x KCl.     LOS: 10 days A FACE TO FACE EVALUATION WAS PERFORMED  Adalei Novell 04/06/2019, 9:09 AM

## 2019-04-06 NOTE — Progress Notes (Signed)
Speech Language Pathology Daily Session Note  Patient Details  Name: Martin [name] MRN: [id number] Date of Birth: 1955-09-30  Today's Date: 04/06/2019 SLP Individual Time: 1000-1045 SLP Individual Time Calculation (min): 45 min  Short Term Goals: Week 2: SLP Short Term Goal 1 (Week 2): STG = LTG due to remaining LOS  Skilled Therapeutic Interventions: Pt was seen for skilled ST targeting skills. SLP facilitated session with 1 semi-complex restaurant scheduling worksheet, and 1 complex deductive reasoning scheduling worksheet (furniture delivery). Overall Supervision A verbal cues provided for pt to identify key information and starting point and semi-complex problem solving during restaurant schedule task. Increased Min A for organization and complex problem solving provided during deductive reasoning puzzle. Pt was somewhat distracted by wind and self reported "jittery/anxiousness", however easily redirected with Supervision A verbal cues. Pt reported feeling SOB, however SpO2 was stable at 93. Pt left sitting in wheelchair with seatbelt alarm engaged, all needs within reach, and NT present. Continue per current plan of care.       Pain Pain Assessment Pain Scale: Faces Pain Score: 0-No pain Faces Pain Scale: Hurts a little bit Pain Type: Acute pain Pain Location: Shoulder Pain Orientation: Right Pain Descriptors / Indicators: Sore Pain Frequency: Intermittent Pain Onset: On-going Patients Stated Pain Goal: 2 Pain Intervention(s): RN made aware;Medication (See eMAR) Multiple Pain Sites: No  Therapy/Group: Individual Therapy  Martin [name] 04/06/2019, 12:08 PM

## 2019-04-06 NOTE — Progress Notes (Signed)
Occupational Therapy Session Note  Patient Details  Name: Martin [name] MRN: [id number] Date of Birth: December 09, 1955  Today's Date: 04/06/2019 OT Individual Time: 0700-0810 OT Individual Time Calculation (min): 70 min    Short Term Goals: Week 2:  OT Short Term Goal 1 (Week 2): STG=LTG secondary to ELOS  Skilled Therapeutic Interventions/Progress Updates:    Pt resting in bed upon arrival and agreeable to therapy.  Pt requested to take shower this morning.  Pt required min A for supine>sit EOB.  Pt amb with RW to bathroom with CGA and transferred to tub seat.  Pt completed bathing using sponge for feet but required assistance for thoroughness with cleaning buttocks. Pt returned to room and completed dressing tasks with sit<>stand from EOB.  Pt used reacher to thread underpants/pants and required CGA for standing balance when pulling over hips. Pt attempted donning shoes but swelling in L foot required therapist to assist with heel. Sit<>stand this morning with CGA.  Pt stood at sink to brush teeth.  Pt on RA throughout session.  Pt SOB after minimal activity requiring appropriate rest breaks with breathing strategies/tehniques. Pt requested to return to bed to rest.  Pt states he did not rest well last night and wanted to rest before next session. Pt remained in bed with all needs within reach and bed alarm activated.   Therapy Documentation Precautions:  Precautions Precautions: Sternal, Fall Precaution Booklet Issued: No Precaution Comments: Lft hemiparesis Restrictions Weight Bearing Restrictions: No Other Position/Activity Restrictions: sternal precautions  Pain: Pt denies pain this morning   Therapy/Group: Individual Therapy  Leroy Libman 04/06/2019, 8:12 AM

## 2019-04-07 ENCOUNTER — Inpatient Hospital Stay (HOSPITAL_COMMUNITY): Payer: Medicare Other | Admitting: Speech Pathology

## 2019-04-07 ENCOUNTER — Inpatient Hospital Stay (HOSPITAL_COMMUNITY): Payer: Medicare Other

## 2019-04-07 ENCOUNTER — Inpatient Hospital Stay (HOSPITAL_COMMUNITY): Payer: Medicare Other | Admitting: Physical Therapy

## 2019-04-07 MED ORDER — TRAZODONE HCL 50 MG PO TABS
100.0000 mg | ORAL_TABLET | Freq: Every evening | ORAL | Status: DC | PRN
  Administered 2019-04-07 – 2019-04-09 (×3): 100 mg via ORAL
  Administered 2019-04-10: 50 mg via ORAL
  Filled 2019-04-07 (×4): qty 2

## 2019-04-07 NOTE — Progress Notes (Signed)
Pt stated he can place self on home cpap unit tonight. RT added sterile water to water chamber and placed O2 adapter inline.

## 2019-04-07 NOTE — Plan of Care (Signed)
  Problem: RH Dressing Goal: LTG Patient will perform lower body dressing w/assist (OT) Description: LTG: Patient will perform lower body dressing with assist, with/without cues in positioning using equipment (OT) Flowsheets (Taken 04/07/2019 1235) LTG: Pt will perform lower body dressing with assistance level of: (downgraded due to slow progress) Minimal Assistance - Patient > 75% Note: Downgraded due to slow progress

## 2019-04-07 NOTE — Progress Notes (Signed)
Patient had syncopal episode --urinated in BR then came back to sit at EOB when he had brief LOC. He was laid flat and already awake and at baseline before lying down per nursing. BP noted to be severely elevated but started trending down and patient without any reports of chest pain, pressure or palpations. EKG ordered for work up--will have cardiology review

## 2019-04-07 NOTE — Progress Notes (Signed)
Patient states "he is feeling fine." BP 148/78. No s/s of distress or no complications noted at this time. Continue plan of care. Audie Clear, LPN

## 2019-04-07 NOTE — Progress Notes (Signed)
Physical Therapy Session Note  Patient Details  Name: Martin [name] MRN: [id number] Date of Birth: Nov 24, 1955  Today's Date: 04/07/2019 PT Individual Time: 0902-1021 PT Individual Time Calculation (min): 79 min   Short Term Goals: Week 2:  PT Short Term Goal 1 (Week 2): STG=LTG due to ELOS.  Skilled Therapeutic Interventions/Progress Updates:    Pt received sitting in w/c and agreeable to therapy session; however, reports he has only gotten 2 hours of sleep every night since he has been here which makes him exhausted. Discussed reasons for why pt isn't sleeping and he reports that he has a lot on his mind with concerns about getting well in order to go home and return to work. Therapist provided emotional support and educated pt on his progress in CIR with progression towards D/C home next week. Sit<>stands using RW with CGA/close supervision throughout session - 1x pt had minor posterior LOB but was able to recover with CGA and min cuing to maintain anterior weightshift. Ambulated 30ft, 84ft (1 standing rest break), 20ft (seated break between each) using RW with CGA for steadying and w/c follow in event of fatigue. Monitored Spo2 and HR after each walk: 1st 90%, 99bpm; 2nd 90%, 74bpm; 3rd 91% and 95bpm.  Performed the following activities 2 sets each in a circuit format with sitting rest breaks between each task: - Repeated L and R foot taps on 6" step using BHRs for balance x10 each with CGA for steadying (thearpist blocking L LE circumduction compensation due to hip flexor weakness) - Repeated R LE step up/down on 6" step using BHRs focusing on LE strengthening and initiating stair training with CGA for steadying - cuing to maintain in the tube for sternal precautions - Sit<>stands x10 without UE support with CGA for steadying progressed to close supervision and pt demonstrating improved ability to maintain forward weight shift - cuing to increase L knee flexion to improve foot position  prior to standing. - Repeated L LE step up/down on 6" step using BHRs focusing on LE strengthening and initiating stair training with CGA for steadying  Pt reports at home he only has a vertical handrail (looks like grab bar) on the L side of his stairs - therapist educated him on talking to his son about building an actual L HR on the stairs - pt agreeable. Lateral step up/down 2steps (6" height) x2 without seated rest break between using B UE support on L HR - min assist for balance and cuing for sequencing of task - repeated task a 2nd time with pt demonstrating improved foot placement on steps for improved balance. Ambulated 142ft back to room using RW, no rest breaks, with CGA for safety and SpO2 90%. Pt left seated in w/c with NT present to assume care of pt as he reports need to use bathroom.  Therapy Documentation Precautions:  Precautions Precautions: Sternal, Fall Precaution Booklet Issued: No Precaution Comments: Lft hemiparesis Restrictions Weight Bearing Restrictions: No Other Position/Activity Restrictions: sternal precautions   Vital Signs: SpO2 equal to or greater than 90% throughout session. HR WNL for activity.    Pain:   Denies pain during session stating "just my normal pains."    Therapy/Group: Individual Therapy  Tawana Scale, PT, DPT 04/07/2019, 7:53 AM

## 2019-04-07 NOTE — Progress Notes (Addendum)
Staff called to assistance to room, patient stated "he holds his breath while walking." Patient sat on side of bed to rest and felt dizzy. Patient unresponsive for about less than 15 seconds. O2sats below 89%, started O2 at [employer]. BP 215/121, re-checked manual at 220/102. Patient responding, A&Ox4. O2 96% @ 2L. Re-checked BP at 180/82. Olin Hauser, [place] and Dr. Dagoberto Ligas notified of incident. EKG ordered. Continue plan of care. Will continue to monitor patient. Issue reported to charge nurse and nurse taking care of patient Ed, RN. Audie Clear, LPN

## 2019-04-07 NOTE — Progress Notes (Signed)
Occupational Therapy Session Note  Patient Details  Name: Martin [name] MRN: [id number] Date of Birth: 04-20-1956  Today's Date: 04/07/2019 OT Individual Time: 0700-0815 OT Individual Time Calculation (min): 75 min    Short Term Goals: Week 2:  OT Short Term Goal 1 (Week 2): STG=LTG secondary to ELOS  Skilled Therapeutic Interventions/Progress Updates:    Pt resting in w/c upon arrival and stated he needed to use toilet.  Pt amb with RW to bathroom and transferred to [facility] over toilet at supervision level.  Pt used toileting tongs for hygiene but required assistance for thoroughness.  Pt also has ordered a toileting aid for use at home which has a longer handle and better angel. Pt engaged in ongoing BADL retraining including bathing at shower level and dressing with sit<>stand from EOB. Pt requires assistance donning Ted hose and shoes but completed all other tasks without assistance. Pt states his wife will be able to assist with donning shoes.Pt continues to fatigue quickly requiring multiple rest breaks.  Pt practiced stepping over ledge of simulated walk-in shower.  Pt performed with CGA. Recommended pt hold off on shower at home until [facility] assesses. Pt remained seated in w/c with belt alarm activated and all needs within reach.   Therapy Documentation Precautions:  Precautions Precautions: Sternal, Fall Precaution Booklet Issued: No Precaution Comments: Lft hemiparesis Restrictions Weight Bearing Restrictions: No Other Position/Activity Restrictions: sternal precautions  Pain:  Pt denies pain this morning   Therapy/Group: Individual Therapy  Leroy Libman 04/07/2019, 8:18 AM

## 2019-04-07 NOTE — Progress Notes (Signed)
  Subjective: Left-sided weakness after perioperative CVA improving with CIR multidisciplinary therapies-appreciate great care. Surgical incisions healing adequately Peripheral edema controlled with twice daily Lasix 20 mg Rate controlled atrial fibrillation  Objective: Vital signs in last 24 hours: Temp:  [98 F (36.7 C)-98.6 F (37 C)] 98 F (36.7 C) (10/30 0500) Pulse Rate:  [67-76] 74 (10/30 1042) Resp:  [17-18] 17 (10/30 0500) BP: (141-148)/(77-88) 144/82 (10/30 1042) SpO2:  [91 %-94 %] 94 % (10/30 0500) Weight:  [120.1 kg] 120.1 kg (10/30 0500)  Hemodynamic parameters for last 24 hours:    Intake/Output from previous day: 10/29 0701 - 10/30 0700 In: 982 [P.O.:982] Out: 925 [Urine:925] Intake/Output this shift: Total I/O In: 240 [P.O.:240] Out: -     Lab Results: Recent Labs    04/06/19 0600  WBC 4.5  HGB 10.9*  HCT 33.7*  PLT 205   BMET:  Recent Labs    04/06/19 0600  NA 138  K 3.5  CL 102  CO2 27  GLUCOSE 115*  BUN 12  CREATININE 0.81  CALCIUM 9.1    PT/INR: No results for input(s): LABPROT, INR in the last 72 hours. ABG    Component Value Date/Time   PHART 7.478 (H) 03/16/2019 1239   HCO3 24.2 03/16/2019 1239   TCO2 25 03/16/2019 1239   ACIDBASEDEF 3.0 (H) 03/10/2019 1833   O2SAT 62.5 03/18/2019 0430   CBG (last 3)  No results for input(s): GLUCAP in the last 72 hours.  Assessment/Plan: S/P CABG with postoperative CVA. L side weakness Rate controlled afib on Eliquis Will follow as outpatient after discharge  LOS: 11 days    Martin [name] 04/07/2019

## 2019-04-07 NOTE — Progress Notes (Signed)
[place] PHYSICAL MEDICINE & REHABILITATION PROGRESS NOTE   Subjective/Complaints:  Pt reports  Swelling in LEs- still not sleeping- willing to try higher dose of Trazodone. TV not working- remote.  ROS: Patient denies fever, rash, sore throat, blurred vision, nausea, vomiting, diarrhea, cough, chest pain, joint or back pain, headache, or mood change.    Objective:    No results found. Recent Labs    04/06/19 0600  WBC 4.5  HGB 10.9*  HCT 33.7*  PLT 205   Recent Labs    04/06/19 0600  NA 138  K 3.5  CL 102  CO2 27  GLUCOSE 115*  BUN 12  CREATININE 0.81  CALCIUM 9.1    Intake/Output Summary (Last 24 hours) at 04/07/2019 0831 Last data filed at 04/07/2019 0100 Gross per 24 hour  Intake 582 ml  Output 925 ml  Net -343 ml     Physical Exam: Vital Signs Blood pressure (!) 148/88, pulse 76, temperature 98 F (36.7 C), resp. rate 17, weight 120.1 kg, SpO2 94 %. Constitutional: No distress . Vital signs reviewed Sitting up at EOB with OT; no O2, NAD; slightly jittery still. HEENT: EOMI, conjugate gaze Neck: supple Cardiovascular: RRR without murmur. No JVD    Respiratory: CTA Bilaterally. Normal effort   Off O2; no wheezes, rales or rhonchi still GI: BS +, non-tender, non-distended  Skin: Healing abrasions bilateral LE's Psych: slightly anxious. Musc: LLE edema 2-3+, L>R, but RLE still ~2+ (per pt no worse); TEDS in place Right ankle pain improved Neurological: Alert Motor: LUE 4-4+/5 proximal to distal.  LLE: 3+/5 HF, 4-/5KE and 0/5 ADF RUE: 5/5 proximal to distal, stable RLE: 4-4+/5 proximal to distal.   Assessment/Plan: 1. Functional deficits secondary to right ACA infarct which require 3+ hours per day of interdisciplinary therapy in a comprehensive inpatient rehab setting.  Physiatrist is providing close team supervision and 24 hour management of active medical problems listed below.  Physiatrist and rehab team continue to assess barriers to  discharge/monitor patient progress toward functional and medical goals  Care Tool:  Bathing    Body parts bathed by patient: Right arm, Left arm, Chest, Abdomen, Front perineal area, Right upper leg, Left upper leg, Face, Right lower leg, Left lower leg, Buttocks   Body parts bathed by helper: Buttocks     Bathing assist Assist Level: Contact Guard/Touching assist     Upper Body Dressing/Undressing Upper body dressing   What is the patient wearing?: Pull over shirt    Upper body assist Assist Level: Set up assist    Lower Body Dressing/Undressing Lower body dressing      What is the patient wearing?: Pants     Lower body assist Assist for lower body dressing: Supervision/Verbal cueing     Toileting Toileting Toileting Activity did not occur (Clothing management and hygiene only): N/A (no void or bm)  Toileting assist Assist for toileting: Minimal Assistance - Patient > 75%     Transfers Chair/bed transfer  Transfers assist     Chair/bed transfer assist level: Contact Guard/Touching assist Chair/bed transfer assistive device: Programmer, multimedia   Ambulation assist   Ambulation activity did not occur: Safety/medical concerns(fatigue/SOB/R ankle pain)  Assist level: Contact Guard/Touching assist Assistive device: Walker-rolling Max distance: 55'   Walk 10 feet activity   Assist  Walk 10 feet activity did not occur: Safety/medical concerns(fatigue/SOB/R ankle pain)  Assist level: Contact Guard/Touching assist Assistive device: Walker-rolling   Walk 50 feet activity   Assist  Walk 50 feet with 2 turns activity did not occur: Safety/medical concerns(R ankle pain and decreased activity tolerance)  Assist level: Contact Guard/Touching assist Assistive device: Walker-rolling    Walk 150 feet activity   Assist Walk 150 feet activity did not occur: Safety/medical concerns(R ankle pain and decreased activity tolerance)         Walk 10  feet on uneven surface  activity   Assist Walk 10 feet on uneven surfaces activity did not occur: Safety/medical concerns(R ankle pain and decreased activity tolerance)         Wheelchair     Assist     Wheelchair activity did not occur: Safety/medical concerns(Sternal precautions)         Wheelchair 50 feet with 2 turns activity    Assist    Wheelchair 50 feet with 2 turns activity did not occur: Safety/medical concerns(Sternal precautions)       Wheelchair 150 feet activity     Assist  Wheelchair 150 feet activity did not occur: Safety/medical concerns(Sternal precautions)       Blood pressure (!) 148/88, pulse 76, temperature 98 F (36.7 C), resp. rate 17, weight 120.1 kg, SpO2 94 %.  Medical Problem List and Plan: 1.Left hemiparesis and decreased mobilitysecondary to right ACA infarct after CABG  Continue CIR PRAFO LLE 2. Antithrombotics: -DVT/anticoagulation:Pharmaceutical:Other (comment)--Eliquis -antiplatelet therapy: On low dose ASA. 3. Pain Management:Oxycodone prn 4. Mood:LCSW to follow for evaluation and support. -antipsychotic agents: N/A 5. Neuropsych: This patientiscapable of making decisions onhisown behalf. 6. Skin/Wound Care:Monitor wound for healing. Continue protein supplement to help promote healing. PRAFO LLE 7. Fluids/Electrolytes/Nutrition:encourage PO  -check labs Monday  10/26- labs still pending- will see by tomorrow  10/29- labs look great 8. CAD s/p CABG:Continue sternal precautions. ON ASA and Lipitor. 9. HTN:BP goal 110-140 to allow for adequate cerebral perfusion.  BP borderline 10/25---observe  10/28- will maintain BP due to goal for cerebral perfusion 10 Leukocytosis:Resolved  11.Diastolic heart failure: Strict I/O and daily weights. On Lasix daily with low dose BB.  -cxr reviewed---bilateral effusions, no obvious edema Filed Weights    04/04/19 0339 04/05/19 0452 04/07/19 0500  Weight: 120.1 kg 118.9 kg 120.1 kg    Weights trending up until 10/23.  Pt diuresed quite a bit after IV lasix Friday night.   - 10/25 pt is -315 for last 24 hrs. Still looks a little fluid overloaded   -increase lasix to 20mg  bid  10/26- 4 kg higher in 2 days- will give another 40 mg Lasix x1 and follow closely.  10/27- down 2 kgs- will monitor and wait on more lasix  10/28- down another 1+ kg- maintain- will recheck labs in AM 12. Abnormal LFTs: WNL 10/20  13. Malnutrition: Pre-albumin- 14.1 14. A fib: Monitor for recurrent bradycardia--on Eliquis bid and back on amiodarone and metoprolol daily 15. Persistent hyponatremia:   Sodium 136 on 10/20, labs ordered for Monday  Na 138 on 10/29 16. OSA/Hypoxia: PFTs with moderate to severe obstructive airway disease--now on Dulera bid.   CPAP   Wean supplemental oxygen as tolerated, continues to require  10/28- off O2 by  17. Urinary retention:DC'ed foley---> bladder training.   Ua negative, ucx no growth  Flomax increased on 10/21  Voiding with low pvr's now 18. Acute Blood Loss Anemia  Hb 10.3 on 10/20, labs ordered for Monday 19. Hypoalbuminemia  Supplement initiated on 10/21 20. Acute Gout Flare  Cont allopurinol   Colchicine started on 10/21  Improved--watch with diuresis  10/26- improved  21.  Morbid obesity: Encouraged weight loss  22. Borderline Hypokalemia  10/29- K+ 3.5- has been trending down last few lab draws- will give 1x KCl.  23. Insomnia- will increase Trazadone    LOS: 11 days A FACE TO FACE EVALUATION WAS PERFORMED  Martin [name] 04/07/2019, 8:31 AM

## 2019-04-08 ENCOUNTER — Inpatient Hospital Stay (HOSPITAL_COMMUNITY): Payer: Medicare Other | Admitting: Physical Therapy

## 2019-04-08 ENCOUNTER — Inpatient Hospital Stay (HOSPITAL_COMMUNITY): Payer: Medicare Other

## 2019-04-08 MED ORDER — INFLUENZA VAC A&B SA ADJ QUAD 0.5 ML IM PRSY
0.5000 mL | PREFILLED_SYRINGE | INTRAMUSCULAR | Status: AC
  Administered 2019-04-09: 0.5 mL via INTRAMUSCULAR
  Filled 2019-04-08: qty 0.5

## 2019-04-08 NOTE — Progress Notes (Signed)
[place] PHYSICAL MEDICINE & REHABILITATION PROGRESS NOTE   Subjective/Complaints:  Pt reports still on 1L of O2 from yesterday's syncopal episode- slept some with increase in trazodone to 100 mg QHS prn- was in bed from 3pm to 7am this AM-no more presyncope or syncope - admits was holding breath yesterday with walking and when felt "weird" before had syncopal event.  ROS: Patient denies fever, rash, sore throat, blurred vision, nausea, vomiting, diarrhea, cough, chest pain, joint or back pain, headache, or mood change.    Objective:    No results found. Recent Labs    04/06/19 0600  WBC 4.5  HGB 10.9*  HCT 33.7*  PLT 205   Recent Labs    04/06/19 0600  NA 138  K 3.5  CL 102  CO2 27  GLUCOSE 115*  BUN 12  CREATININE 0.81  CALCIUM 9.1    Intake/Output Summary (Last 24 hours) at 04/08/2019 1115 Last data filed at 04/08/2019 0900 Gross per 24 hour  Intake 840 ml  Output 1450 ml  Net -610 ml     Physical Exam: Vital Signs Blood pressure (!) 158/86, pulse 68, temperature 97.7 F (36.5 C), temperature source Oral, resp. rate 18, weight 117.4 kg, SpO2 98 %. Constitutional: No distress . Vital signs reviewed- BP slightly elevated this AM/yesterday afternoon Sitting up in manual w/c, eating breakfast, on 1L O2 by [place], NAD HEENT: EOMI, conjugate gaze Neck: supple Cardiovascular: IR- rate controlled  Respiratory: CTA Bilaterally. Normal effort   GI: BS +, non-tender, non-distended  Skin: Healing abrasions bilateral LE's Psych: slightly anxious/jittery, but stable. Musc: LLE edema 2-3+, L>R, but RLE still ~2+ (per pt no worse); TEDS in place Right ankle pain improved Neurological: Alert Motor: LUE 4-4+/5 proximal to distal.  LLE: 3+/5 HF, 4-/5KE and 0/5 ADF RUE: 5/5 proximal to distal, stable RLE: 4-4+/5 proximal to distal.   Assessment/Plan: 1. Functional deficits secondary to right ACA infarct which require 3+ hours per day of interdisciplinary therapy in a  comprehensive inpatient rehab setting.  Physiatrist is providing close team supervision and 24 hour management of active medical problems listed below.  Physiatrist and rehab team continue to assess barriers to discharge/monitor patient progress toward functional and medical goals  Care Tool:  Bathing    Body parts bathed by patient: Right arm, Left arm, Chest, Abdomen, Front perineal area, Right upper leg, Left upper leg, Face, Right lower leg, Left lower leg, Buttocks   Body parts bathed by helper: Buttocks     Bathing assist Assist Level: Contact Guard/Touching assist     Upper Body Dressing/Undressing Upper body dressing   What is the patient wearing?: Pull over shirt    Upper body assist Assist Level: Set up assist    Lower Body Dressing/Undressing Lower body dressing      What is the patient wearing?: Pants     Lower body assist Assist for lower body dressing: Supervision/Verbal cueing     Toileting Toileting Toileting Activity did not occur (Clothing management and hygiene only): N/A (no void or bm)  Toileting assist Assist for toileting: Minimal Assistance - Patient > 75%     Transfers Chair/bed transfer  Transfers assist     Chair/bed transfer assist level: Contact Guard/Touching assist Chair/bed transfer assistive device: Programmer, multimedia   Ambulation assist   Ambulation activity did not occur: Safety/medical concerns(fatigue/SOB/R ankle pain)  Assist level: Contact Guard/Touching assist Assistive device: Walker-rolling Max distance: 121ft   Walk 10 feet activity  Assist  Walk 10 feet activity did not occur: Safety/medical concerns(fatigue/SOB/R ankle pain)  Assist level: Contact Guard/Touching assist Assistive device: Walker-rolling   Walk 50 feet activity   Assist Walk 50 feet with 2 turns activity did not occur: Safety/medical concerns(R ankle pain and decreased activity tolerance)  Assist level: Contact  Guard/Touching assist Assistive device: Walker-rolling    Walk 150 feet activity   Assist Walk 150 feet activity did not occur: Safety/medical concerns(R ankle pain and decreased activity tolerance)  Assist level: Contact Guard/Touching assist Assistive device: Walker-rolling    Walk 10 feet on uneven surface  activity   Assist Walk 10 feet on uneven surfaces activity did not occur: Safety/medical concerns(R ankle pain and decreased activity tolerance)         Wheelchair     Assist     Wheelchair activity did not occur: Safety/medical concerns(Sternal precautions)         Wheelchair 50 feet with 2 turns activity    Assist    Wheelchair 50 feet with 2 turns activity did not occur: Safety/medical concerns(Sternal precautions)       Wheelchair 150 feet activity     Assist  Wheelchair 150 feet activity did not occur: Safety/medical concerns(Sternal precautions)       Blood pressure (!) 158/86, pulse 68, temperature 97.7 F (36.5 C), temperature source Oral, resp. rate 18, weight 117.4 kg, SpO2 98 %.  Medical Problem List and Plan: 1.Left hemiparesis and decreased mobilitysecondary to right ACA infarct after CABG  Continue CIR PRAFO LLE 2. Antithrombotics: -DVT/anticoagulation:Pharmaceutical:Other (comment)--Eliquis -antiplatelet therapy: On low dose ASA. 3. Pain Management:Oxycodone prn 4. Mood:LCSW to follow for evaluation and support. -antipsychotic agents: N/A 5. Neuropsych: This patientiscapable of making decisions onhisown behalf. 6. Skin/Wound Care:Monitor wound for healing. Continue protein supplement to help promote healing. PRAFO LLE 7. Fluids/Electrolytes/Nutrition:encourage PO  -check labs Monday  10/26- labs still pending- will see by tomorrow  10/29- labs look great 8. CAD s/p CABG:Continue sternal precautions. ON ASA and Lipitor. 9. HTN:BP goal 110-140 to  allow for adequate cerebral perfusion.  BP borderline 10/25---observe  10/28- will maintain BP due to goal for cerebral perfusion  10/31- BP 158/86- will not change meds to maintain cerebral perfusion.  10 Leukocytosis:Resolved  11.Diastolic heart failure: Strict I/O and daily weights. On Lasix daily with low dose BB.  -cxr reviewed---bilateral effusions, no obvious edema Filed Weights   04/05/19 0452 04/07/19 0500 04/08/19 0556  Weight: 118.9 kg 120.1 kg 117.4 kg    Weights trending up until 10/23.  Pt diuresed quite a bit after IV lasix Friday night.   - 10/25 pt is -315 for last 24 hrs. Still looks a little fluid overloaded   -increase lasix to 20mg  bid  10/26- 4 kg higher in 2 days- will give another 40 mg Lasix x1 and follow closely.  10/27- down 2 kgs- will monitor and wait on more lasix  10/28- down another 1+ kg- maintain- will recheck labs in AM  10/31- Weight down to 117 kg 12. Abnormal LFTs: WNL 10/20  13. Malnutrition: Pre-albumin- 14.1 14. A fib: Monitor for recurrent bradycardia--on Eliquis bid and back on amiodarone and metoprolol daily 15. Persistent hyponatremia:   Sodium 136 on 10/20, labs ordered for Monday  Na 138 on 10/29 16. OSA/Hypoxia: PFTs with moderate to severe obstructive airway disease--now on Dulera bid.   CPAP   Wean supplemental oxygen as tolerated, continues to require  10/28- off O2 by [place] 17. Urinary retention:DC'ed foley---> bladder training.  Ua negative, ucx no growth  Flomax increased on 10/21  Voiding with low pvr's now 18. Acute Blood Loss Anemia  Hb 10.3 on 10/20, labs ordered for Monday 19. Hypoalbuminemia  Supplement initiated on 10/21 20. Acute Gout Flare  Cont allopurinol   Colchicine started on 10/21  Improved--watch with diuresis  10/26- improved 21.  Morbid obesity: Encouraged weight loss  22. Borderline Hypokalemia  10/29- K+ 3.5- has been trending down last few lab draws- will give 1x KCl.  23. Insomnia- will  increase Trazadone to 100 mg  10/31- better at higher dose 24. Dispo  10/31- pt wants flu shot- ordered    LOS: 12 days A FACE TO FACE EVALUATION WAS PERFORMED  Sonni Barse 04/08/2019, 11:15 AM

## 2019-04-08 NOTE — Progress Notes (Signed)
Physical Therapy Session Note  Patient Details  Name: Martin [name] MRN: [id number] Date of Birth: 1955/10/15  Today's Date: 04/08/2019 PT Individual Time: 0805-0905 PT Individual Time Calculation (min): 60 min   Short Term Goals: Week 1:  PT Short Term Goal 1 (Week 1): Patient will perform bed mobility with min A adhearing to sternal precautions. PT Short Term Goal 1 - Progress (Week 1): Met PT Short Term Goal 2 (Week 1): Patient will perform basic transfers with mod A using LRAD adhearing to sternal precautions. PT Short Term Goal 2 - Progress (Week 1): Met PT Short Term Goal 3 (Week 1): Patient will initiate gait training. PT Short Term Goal 3 - Progress (Week 1): Met PT Short Term Goal 4 (Week 1): Patient will perform dynamic standing balance activities with min A using LRAD. PT Short Term Goal 4 - Progress (Week 1): Met Week 2:  PT Short Term Goal 1 (Week 2): STG=LTG due to ELOS. Week 3:     Skilled Therapeutic Interventions/Progress Updates:    PAIN  Denies pain  HR 80-90 02 sats 92-97 Pt initially oob in wc w/nursing providing pain meds.  Discussed episode of passing out yesterday and pt/nursing stated EKG was good and likely due to overexertion and lack of sleep.  Agreed to monitor pt and proceed as tolerated.  Pt stated he felt better, but not 100% yet. Agreed to cardiovascular warm up activity and proceed from there as tolerated. Pt transported to gym and performed sts from wc w/min assist and short distance gait to NuStep w/RW and cga.  Turn/sit transfer w/RW w/cga. Pt set up in Nustep by therapist but requires cues not to "hold breath" when attempting to move LLE onto and off of legrest.  Pt performed Nustep warm up L3 x 5 min w/RPE 5/10, HR 90, 02sats 94%.  STS from Nustep to RW w/cga after single unsuccessful effort due to inadequate ant wt shift. Gait 120f w/RW and cga.  Pt rested in sitting x 458m for recovery then performed Gait 12537f/RW as above.   Repeated  STS from mat without UE use x4, rest x 3 min, x5 for strengthening, balance w/transition, and to work on ant wt shift.   Pt transported back to room for time efficiency and remained oob in wc w/alarm belt set and needs in reach.    Pt anxious today and states this is his baseline nature.  Cues for deep breathing required w/exertion and as relaxation technique.  Did not address stairs/steps this am due to episode of syncope yesterday, but tolerated session well, currently on .5L 02 x 24hrs.    Therapy Documentation Precautions:  Precautions Precautions: Sternal, Fall Precaution Booklet Issued: No Precaution Comments: Lft hemiparesis Restrictions Weight Bearing Restrictions: No Other Position/Activity Restrictions: sternal precautions     Therapy/Group: Individual Therapy  BarCallie FieldingT [place]/31/2020, 12:59 PM

## 2019-04-08 NOTE — Plan of Care (Signed)
  Problem: Consults Goal: RH STROKE PATIENT EDUCATION Description: See Patient Education module for education specifics  Outcome: Progressing   Problem: RH SKIN INTEGRITY Goal: RH STG SKIN FREE OF INFECTION/BREAKDOWN Outcome: Progressing Goal: RH STG MAINTAIN SKIN INTEGRITY WITH ASSISTANCE Description: STG Maintain Skin Integrity With min Assistance. Outcome: Progressing   Problem: RH PAIN MANAGEMENT Goal: RH STG PAIN MANAGED AT OR BELOW PT'S PAIN GOAL Description: Less than 3 out of 10 Outcome: Progressing   Problem: RH KNOWLEDGE DEFICIT Goal: RH STG INCREASE KNOWLEDGE OF HYPERTENSION Description: Patient able to state 2 methods of controlling high blood pressure Outcome: Progressing Goal: RH STG INCREASE KNOWLEGDE OF HYPERLIPIDEMIA Description: Pt will be able to demonstrate medication compliance and diet regimen to manage HLD Outcome: Progressing Goal: RH STG INCREASE KNOWLEDGE OF STROKE PROPHYLAXIS Description: Pt will be able to verbalize medication understanding and 3 signs of stroke to prevent stroke reoccuring Outcome: Progressing

## 2019-04-08 NOTE — Progress Notes (Signed)
Physical Therapy Session Note  Patient Details  Name: Martin [name] MRN: [id number] Date of Birth: 13-Jan-1956  Today's Date: 04/08/2019 PT Individual Time: 1405-1430 PT Individual Time Calculation (min): 25 min   Short Term Goals: Week 2:  PT Short Term Goal 1 (Week 2): STG=LTG due to ELOS.  Skilled Therapeutic Interventions/Progress Updates:   Pt in w/c and agreeable to therapy, denies pain. Session focused on functional endurance and decreasing supplemental O2 use. Total assist w/c transport to/from therapy gym. Pt on 2 L/min supplemental O2 at rest, spO2 @ 98% and HR 65 bpm. Pt >95% at rest on RA. Ambulated 50' x3 while monitoring spO2 and HR during gait bouts. SpO2 >92% and HR in 90s throughout gait. Moderate increase in work of breathing w/ each gait bout, resolves w/ seated rest and verbal/visual cues for deep breathing strategies. Ambulated w/ CGA and close w/c follow for safety, verbal cues to slow his pace and reminding him to breath while ambulating. Returned to room and ambulated to toilet w/ CGA. Ended session toileting, all needs in reach and pt in agreement to call RN prior to standing from toilet, RN in agreement w/ this as well. Pt remained on 2 L/min supplemental O2 at end of session.   Therapy Documentation Precautions:  Precautions Precautions: Sternal, Fall Precaution Booklet Issued: No Precaution Comments: Lft hemiparesis Restrictions Weight Bearing Restrictions: No Other Position/Activity Restrictions: sternal precautions  Vital Signs: Therapy Vitals Temp: 98.7 F (37.1 C) Pulse Rate: 64 Resp: 18 BP: 128/87 Patient Position (if appropriate): Sitting Oxygen Therapy SpO2: 97 % O2 Device: Room Air  Therapy/Group: Individual Therapy  [name] Clent Demark 04/08/2019, 3:27 PM

## 2019-04-09 ENCOUNTER — Inpatient Hospital Stay (HOSPITAL_COMMUNITY): Payer: Medicare Other | Admitting: Speech Pathology

## 2019-04-09 ENCOUNTER — Inpatient Hospital Stay (HOSPITAL_COMMUNITY): Payer: Medicare Other

## 2019-04-09 NOTE — Progress Notes (Signed)
Physical Therapy Session Note  Patient Details  Name: Martin [name] MRN: [id number] Date of Birth: 11/02/1955  Today's Date: 04/09/2019 PT Individual Time: 1005-1105 PT Individual Time Calculation (min): 60 min   Short Term Goals: Week 2:  PT Short Term Goal 1 (Week 2): STG=LTG due to ELOS.  Skilled Therapeutic Interventions/Progress Updates:     Patient in w/c in room on 1L/min O2 upon PT arrival. Patient alert and agreeable to PT session. SPO2 98% on 1 L and remained at 98% on RA at rest at beginning of session. Patient denied pain throughout session. Discussed patient's syncopal episode the other day at beginning of session. Educated on causes of syncope as well as signs and symptoms. Instructed patient to find a place to sit or lie down if possible and to focus on diaphragmatic breathing at symptom onset. Also educated on fall risk/prevention and activation of emergency services in the event of a fall. Patient was very receptive to all education and able to teach back key points.   Focused session on stair training for strength, endurance, and functional mobility for entering/exiting his home upon d/c.   Therapeutic Activity: Transfers: Patient performed sit to/from stand x6 with close supervision using a RW. 1 posterior LOB, patient able to recover with supervision. Provided verbal cues for leaning forward and pressing through his toes to prevent posterior LOB.  Gait Training:  Patient ambulated 100 feet and 65 feet using RW with close supervision for safety. Ambulated with decreased gait speed, decreased step length and height L>R, decreased L foot clearance with fatigue, L toe out, and forward trunk flexion. Provided verbal cues for erect posture, paced breathing for increased breath support, and increased L foot clearance. Educated on listening for L foot drag as an indicator for fatigue and taking a standing or sitting rest break at onset.  Patient performed stair training going  up/down 4-6" steps using the RW, ascending backwards leading with R foot and  descending forwards leading with L foot, following PT's demonstration. Patient required CGA-min A throughout and had 1 LOB descending due to poor L foot placement on step, leading to patient using B hand rails to complete last 2 steps for safety. He then went up/down 4-6" steps using a side-stepping technique with a L rail with CGA, required brief standing rest breaks between each step to reset L foot and perform pursed lib breathing due to mild SOB. Discussed home set up for stairs and determined patient would need to be able to perform stairs using the RW to enter the home due to not having rails, plan for son to install rails soon after d/c, and for access to the den where there are 2 steps without rails.  He then performed stepping up/down 1-6" step x3 using the RW, ascending backwards and descending forwards as above with focus on L foot placement and balance throughout.  Wheelchair Mobility:  Patient was transported in the w/c with total A throughout session for energy conservation and time management. Discussed plan for patient to go home with a transport chair due to sternal precautions and increased ability for his wife to manage the chair due to lighter weight than a w/c.  Patient in w/c in the room on RA at end of session with breaks locked, chair alarm set, and all needs within reach. Patient remained on RA throughout session, SPO2 >93% with activity and at rest on RA.   Therapy Documentation Precautions:  Precautions Precautions: Sternal, Fall Precaution Booklet Issued: No  Precaution Comments: Lft hemiparesis Restrictions Weight Bearing Restrictions: No Other Position/Activity Restrictions: sternal precautions     Therapy/Group: Individual Therapy  Kushal Saunders L Alexa Blish PT, DPT  04/09/2019, 12:18 PM

## 2019-04-09 NOTE — Progress Notes (Signed)
Speech Language Pathology Daily Session Note  Patient Details  Name: Martin [name] MRN: [id number] Date of Birth: Sep 03, 1955  Today's Date: 04/09/2019 SLP Individual Time: 52-1515 SLP Individual Time Calculation (min): 30 min  Short Term Goals: Week 2: SLP Short Term Goal 1 (Week 2): STG = LTG due to remaining LOS  Skilled Therapeutic Interventions:  Pt was seen for skilled ST targeting cognitive goals.  Pt was able to recall targeted memory compensatory strategies (WRAP) for 75% accuracy independently.  Pt states that he feels his cognition is almost back to baseline and that he would feel better if he could get better rest.  SLP facilitated the session with a novel card game targeting use of memory compensatory strategies, specifically word-picture associations.  Pt generated and then recalled associations for 100% accuracy with supervision cues.  He then generated and recalled the names of specific category members within task for 100% accuracy with mod I.  Pt was noted to have a stack of papers on his table tray with notes written on them and he reports that he often takes notes to remember things from therapy appointments.  As a result, therapist provided pt with a memory notebook to give pt a place to keep all his papers and make notes during future appointments.  Pt was left in wheelchair with chair alarm set and call bell within reach.  Continue per current plan of care.    Pain Pain Assessment Pain Scale: 0-10 Pain Score: 0-No pain  Therapy/Group: Individual Therapy  Deztiny Sarra, Selinda Orion 04/09/2019, 3:52 PM

## 2019-04-09 NOTE — Plan of Care (Signed)
  Problem: Consults Goal: RH STROKE PATIENT EDUCATION Description: See Patient Education module for education specifics  Outcome: Progressing   Problem: RH SKIN INTEGRITY Goal: RH STG SKIN FREE OF INFECTION/BREAKDOWN Outcome: Progressing Goal: RH STG MAINTAIN SKIN INTEGRITY WITH ASSISTANCE Description: STG Maintain Skin Integrity With min Assistance. Outcome: Progressing   Problem: RH PAIN MANAGEMENT Goal: RH STG PAIN MANAGED AT OR BELOW PT'S PAIN GOAL Description: Less than 3 out of 10 Outcome: Progressing   Problem: RH KNOWLEDGE DEFICIT Goal: RH STG INCREASE KNOWLEDGE OF HYPERTENSION Description: Patient able to state 2 methods of controlling high blood pressure Outcome: Progressing Goal: RH STG INCREASE KNOWLEGDE OF HYPERLIPIDEMIA Description: Pt will be able to demonstrate medication compliance and diet regimen to manage HLD Outcome: Progressing Goal: RH STG INCREASE KNOWLEDGE OF STROKE PROPHYLAXIS Description: Pt will be able to verbalize medication understanding and 3 signs of stroke to prevent stroke reoccuring Outcome: Progressing

## 2019-04-09 NOTE — Progress Notes (Signed)
PHYSICAL MEDICINE & REHABILITATION PROGRESS NOTE   Subjective/Complaints:  Pt reports on 0.5-1L O2- still can't sleep at night for more than a few hours- doesn't want to increase trazodone dose. Michela Pitcher he is almost "scared to go to sleep" he feels, but cannot explain why.   ROS: Patient denies fever, rash, sore throat, blurred vision, nausea, vomiting, diarrhea, cough, chest pain, joint or back pain, headache, or mood change.    Objective:    No results found. No results for input(s): WBC, HGB, HCT, PLT in the last 72 hours. No results for input(s): NA, K, CL, CO2, GLUCOSE, BUN, CREATININE, CALCIUM in the last 72 hours.  Intake/Output Summary (Last 24 hours) at 04/09/2019 1132 Last data filed at 04/09/2019 0739 Gross per 24 hour  Intake 1284 ml  Output 1150 ml  Net 134 ml     Physical Exam: Vital Signs Blood pressure 140/79, pulse (!) 57, temperature 98.1 F (36.7 C), resp. rate 18, weight 120.8 kg, SpO2 96 %. Constitutional: No distress . Vital signs reviewed-high BP resolved Sitting up in manual w/c, done with eating, on 1L O2 by [place], NAD HEENT: EOMI, conjugate gaze Neck: supple Cardiovascular: IR- rate controlled  Respiratory: CTA Bilaterally. Normal effort   GI: BS +, non-tender, non-distended  Skin: Healing abrasions bilateral LE's Psych: slightly anxious/jittery, but stable. Musc: LLE edema 2-3+, L>R, but RLE still ~2+ (per pt no worse); TEDS in place Right ankle pain improved Neurological: Alert Motor: LUE 4-4+/5 proximal to distal.  LLE: 3+/5 HF, 4-/5KE and 0/5 ADF RUE: 5/5 proximal to distal, stable RLE: 4-4+/5 proximal to distal.   Assessment/Plan: 1. Functional deficits secondary to right ACA infarct which require 3+ hours per day of interdisciplinary therapy in a comprehensive inpatient rehab setting.  Physiatrist is providing close team supervision and 24 hour management of active medical problems listed below.  Physiatrist and rehab team  continue to assess barriers to discharge/monitor patient progress toward functional and medical goals  Care Tool:  Bathing    Body parts bathed by patient: Right arm, Left arm, Chest, Abdomen, Front perineal area, Right upper leg, Left upper leg, Face, Right lower leg, Left lower leg, Buttocks   Body parts bathed by helper: Buttocks     Bathing assist Assist Level: Contact Guard/Touching assist     Upper Body Dressing/Undressing Upper body dressing   What is the patient wearing?: Pull over shirt    Upper body assist Assist Level: Set up assist    Lower Body Dressing/Undressing Lower body dressing      What is the patient wearing?: Pants     Lower body assist Assist for lower body dressing: Supervision/Verbal cueing     Toileting Toileting Toileting Activity did not occur (Clothing management and hygiene only): N/A (no void or bm)  Toileting assist Assist for toileting: Minimal Assistance - Patient > 75%     Transfers Chair/bed transfer  Transfers assist     Chair/bed transfer assist level: Contact Guard/Touching assist Chair/bed transfer assistive device: Programmer, multimedia   Ambulation assist   Ambulation activity did not occur: Safety/medical concerns(fatigue/SOB/R ankle pain)  Assist level: Contact Guard/Touching assist Assistive device: Walker-rolling Max distance: 50'   Walk 10 feet activity   Assist  Walk 10 feet activity did not occur: Safety/medical concerns(fatigue/SOB/R ankle pain)  Assist level: Contact Guard/Touching assist Assistive device: Walker-rolling   Walk 50 feet activity   Assist Walk 50 feet with 2 turns activity did not occur: Safety/medical concerns(R  ankle pain and decreased activity tolerance)  Assist level: Contact Guard/Touching assist Assistive device: Walker-rolling    Walk 150 feet activity   Assist Walk 150 feet activity did not occur: Safety/medical concerns(R ankle pain and decreased activity  tolerance)  Assist level: Contact Guard/Touching assist Assistive device: Walker-rolling    Walk 10 feet on uneven surface  activity   Assist Walk 10 feet on uneven surfaces activity did not occur: Safety/medical concerns(R ankle pain and decreased activity tolerance)         Wheelchair     Assist     Wheelchair activity did not occur: Safety/medical concerns(Sternal precautions)         Wheelchair 50 feet with 2 turns activity    Assist    Wheelchair 50 feet with 2 turns activity did not occur: Safety/medical concerns(Sternal precautions)       Wheelchair 150 feet activity     Assist  Wheelchair 150 feet activity did not occur: Safety/medical concerns(Sternal precautions)       Blood pressure 140/79, pulse (!) 57, temperature 98.1 F (36.7 C), resp. rate 18, weight 120.8 kg, SpO2 96 %.  Medical Problem List and Plan: 1.Left hemiparesis and decreased mobilitysecondary to right ACA infarct after CABG  Continue CIR PRAFO LLE 2. Antithrombotics: -DVT/anticoagulation:Pharmaceutical:Other (comment)--Eliquis -antiplatelet therapy: On low dose ASA. 3. Pain Management:Oxycodone prn 4. Mood:LCSW to follow for evaluation and support. -antipsychotic agents: N/A 5. Neuropsych: This patientiscapable of making decisions onhisown behalf. 6. Skin/Wound Care:Monitor wound for healing. Continue protein supplement to help promote healing. PRAFO LLE 7. Fluids/Electrolytes/Nutrition:encourage PO  -check labs Monday  10/26- labs still pending- will see by tomorrow  10/29- labs look great 8. CAD s/p CABG:Continue sternal precautions. ON ASA and Lipitor. 9. HTN:BP goal 110-140 to allow for adequate cerebral perfusion.  BP borderline 10/25---observe  10/28- will maintain BP due to goal for cerebral perfusion  10/31- BP 158/86- will not change meds to maintain cerebral perfusion.   11/1- better on  its own- 140s/80s 10 Leukocytosis:Resolved  11.Diastolic heart failure: Strict I/O and daily weights. On Lasix daily with low dose BB.  -cxr reviewed---bilateral effusions, no obvious edema Filed Weights   04/07/19 0500 04/08/19 0556 04/09/19 0433  Weight: 120.1 kg 117.4 kg 120.8 kg    Weights trending up until 10/23.  Pt diuresed quite a bit after IV lasix Friday night.   - 10/25 pt is -315 for last 24 hrs. Still looks a little fluid overloaded   -increase lasix to 20mg  bid  10/26- 4 kg higher in 2 days- will give another 40 mg Lasix x1 and follow closely.  10/27- down 2 kgs- will monitor and wait on more lasix  10/28- down another 1+ kg- maintain- will recheck labs in AM  10/31- Weight down to 117 kg  11/1- Weight up 3 kg- his swelling looks the same, however.Will monitor in AM for trend 12. Abnormal LFTs: WNL 10/20  13. Malnutrition: Pre-albumin- 14.1 14. A fib: Monitor for recurrent bradycardia--on Eliquis bid and back on amiodarone and metoprolol daily 15. Persistent hyponatremia:   Sodium 136 on 10/20, labs ordered for Monday  Na 138 on 10/29 16. OSA/Hypoxia: PFTs with moderate to severe obstructive airway disease--now on Dulera bid.   CPAP   Wean supplemental oxygen as tolerated, continues to require  10/28- off O2 by [place] 17. Urinary retention:DC'ed foley---> bladder training.   Ua negative, ucx no growth  Flomax increased on 10/21  Voiding with low pvr's now 18. Acute Blood Loss Anemia  Hb 10.3 on 10/20, labs ordered for Monday 19. Hypoalbuminemia  Supplement initiated on 10/21 20. Acute Gout Flare  Cont allopurinol   Colchicine started on 10/21  Improved--watch with diuresis  10/26- improved 21.  Morbid obesity: Encouraged weight loss  22. Borderline Hypokalemia  10/29- K+ 3.5- has been trending down last few lab draws- will give 1x KCl.  23. Insomnia- will increase Trazadone to 100 mg  10/31- better at higher dose  11/1- still can't sleep 24. Dispo  10/31-  pt wants flu shot- ordered  11/1- d/c Tuesday    LOS: 13 days A FACE TO FACE EVALUATION WAS PERFORMED  Ismail Graziani 04/09/2019, 11:32 AM

## 2019-04-09 NOTE — Progress Notes (Signed)
Physical Therapy Discharge Summary  Patient Details  Name: Martin [name] MRN: [id number] Date of Birth: 27-Feb-1956   Patient has met 8 of 10 long term goals due to improved activity tolerance, improved balance, improved postural control, increased strength, decreased pain, ability to compensate for deficits, improved awareness and improved coordination.  Patient to discharge at an ambulatory level Supervision.   Patient's care partner is independent to provide the necessary physical assistance at discharge.  Reasons goals not met: Patient with increased fatigue during last few days due to poor sleep at night. He continues to require light min A to sit up from supine, patient's wife demonstrated that she is able to provide this assistance at home. He also requires min A for car transfers for LE management. Patient's stated that he son will be with them when patient will be getting in/out of a vehicle at d/c and that he can provide this assistance as needed.   Recommendation:  Patient will benefit from ongoing skilled PT services in home health setting to continue to advance safe functional mobility, address ongoing impairments in balance, strength, activity tolerance, functional mobility, patient/family education, and minimize fall risk.  Equipment: transport chair and RW  Reasons for discharge: treatment goals met  Patient/family agrees with progress made and goals achieved: Yes  PT Discharge Precautions/Restrictions Precautions Precautions: Fall;Sternal Restrictions Weight Bearing Restrictions: No Vision/Perception  Vision - Assessment Eye Alignment: Within Functional Limits Tracking/Visual Pursuits: Able to track stimulus in all quads without difficulty Saccades: Within functional limits Perception Perception: Within Functional Limits Praxis Praxis: Intact  Cognition Overall Cognitive Status: Impaired/Different from baseline Arousal/Alertness: Awake/alert Orientation  Level: Oriented X4 Attention: Focused;Sustained Focused Attention: Appears intact Sustained Attention: Appears intact Memory: Impaired(although Mod I for use of strategies) Memory Impairment: Decreased short term memory Awareness: Appears intact Problem Solving: Appears intact Safety/Judgment: Appears intact Sensation Sensation Light Touch: Impaired by gross assessment Light Touch Impaired Details: Impaired RLE;Impaired LLE(peripheral neuropathy at baseline) Proprioception: Impaired by gross assessment(decreased in L LE with functional mobility) Coordination Gross Motor Movements are Fluid and Coordinated: No Fine Motor Movements are Fluid and Coordinated: Yes Coordination and Movement Description: Decreased L LE strength/activity tolerance with all functional mobility Motor  Motor Motor - Discharge Observations: L hemiperisis and decreased activity tolerance, much improved since admission, no longer on additional oxygen support, tolerates all activity on RA with SPO2 >90%  Mobility Bed Mobility Bed Mobility: Rolling Right;Rolling Left;Supine to Sit;Sit to Supine Rolling Right: Minimal Assistance - Patient > 75% Rolling Left: Minimal Assistance - Patient > 75% Supine to Sit: Minimal Assistance - Patient > 75% Sit to Supine: Supervision/Verbal cueing Transfers Transfers: Sit to Stand;Stand to Sit;Stand Pivot Transfers Sit to Stand: Supervision/Verbal cueing Stand to Sit: Supervision/Verbal cueing Stand Pivot Transfers: Supervision/Verbal cueing Stand Pivot Transfer Details: Verbal cues for precautions/safety;Verbal cues for technique Stand Pivot Transfer Details (indicate cue type and reason): Verbal cues for forward weight shift to avoid posterior LOB with transfers Transfer (Assistive device): Rolling walker Locomotion  Gait Ambulation: Yes Gait Assistance: Supervision/Verbal cueing Gait Distance (Feet): 100 Feet Assistive device: Rolling walker Gait Assistance Details:  Verbal cues for safe use of DME/AE;Verbal cues for technique Gait Assistance Details: Verbal cues for erect posture, paced breathing for increased breath support, staying close to the RW for safety, and increased L foot clearance to reduce fall risk Gait Gait: Yes Gait Pattern: Decreased step length - left;Decreased stride length;Step-through pattern;Decreased dorsiflexion - left;Decreased hip/knee flexion - left;Decreased trunk rotation;Trunk flexed;Narrow base of support;Poor foot  clearance - left Gait velocity: decreased Stairs / Additional Locomotion Stairs: Yes Stairs Assistance: Contact Guard/Touching assist Stair Management Technique: One rail Right Number of Stairs: 4 Height of Stairs: 6 Wheelchair Mobility Wheelchair Mobility: No(Due to sternal precautions, patient to use transport chair for community mobility)  Trunk/Postural Assessment  Cervical Assessment Cervical Assessment: Exceptions to WFL(forward head) Thoracic Assessment Thoracic Assessment: Exceptions to WFL(rounded shoulders) Lumbar Assessment Lumbar Assessment: Exceptions to WFL(posterior pelvic tilt) Postural Control Postural Control: Deficits on evaluation(decreased/delaying/posterior lean)  Balance Balance Balance Assessed: Yes Static Sitting Balance Static Sitting - Balance Support: Feet supported Static Sitting - Level of Assistance: 7: Independent Dynamic Sitting Balance Dynamic Sitting - Balance Support: Feet unsupported;No upper extremity supported;During functional activity Dynamic Sitting - Level of Assistance: 7: Independent Dynamic Sitting - Balance Activities: Forward lean/weight shifting;Lateral lean/weight shifting;Reaching for objects Static Standing Balance Static Standing - Balance Support: During functional activity;No upper extremity supported Static Standing - Level of Assistance: 5: Stand by assistance Dynamic Standing Balance Dynamic Standing - Balance Support: Right upper extremity  supported;Left upper extremity supported;During functional activity Dynamic Standing - Level of Assistance: 5: Stand by assistance Dynamic Standing - Balance Activities: Lateral lean/weight shifting;Forward lean/weight shifting;Reaching for objects Extremity Assessment  RUE Assessment RUE Assessment: Within Functional Limits Active Range of Motion (AROM) Comments: WFL for all functional mobility General Strength Comments: WFL for all functional mobility LUE Assessment LUE Assessment: Within Functional Limits Active Range of Motion (AROM) Comments: WFL for all functional mobility General Strength Comments: WFL for all functional mobility RLE Assessment RLE Assessment: Within Functional Limits Active Range of Motion (AROM) Comments: limited anle DF/PF ROM, back to baseline General Strength Comments: Grossly 5/5 in sitting throughout LLE Assessment LLE Assessment: Exceptions to [facility] Active Range of Motion (AROM) Comments: limited DF ROM and tight hamstrings General Strength Comments: Grossly in sitting: 4-/5 hip flexion, 4+/5 knee extension/flexion, 3-/5 DF, 3/5 PF    Ronasia Isola L Marylee Belzer PT, DPT  04/10/2019, 5:28 PM

## 2019-04-09 NOTE — Progress Notes (Signed)
Pt placed on home CPAP at this time.

## 2019-04-10 ENCOUNTER — Inpatient Hospital Stay (HOSPITAL_COMMUNITY): Payer: Medicare Other

## 2019-04-10 ENCOUNTER — Encounter (HOSPITAL_COMMUNITY): Payer: Medicare Other | Admitting: Psychology

## 2019-04-10 ENCOUNTER — Inpatient Hospital Stay (HOSPITAL_COMMUNITY): Payer: Medicare Other | Admitting: Speech Pathology

## 2019-04-10 LAB — CBC
HCT: 33.4 % — ABNORMAL LOW (ref 39.0–52.0)
Hemoglobin: 10.7 g/dL — ABNORMAL LOW (ref 13.0–17.0)
MCH: 30.7 pg (ref 26.0–34.0)
MCHC: 32 g/dL (ref 30.0–36.0)
MCV: 96 fL (ref 80.0–100.0)
Platelets: 177 10*3/uL (ref 150–400)
RBC: 3.48 MIL/uL — ABNORMAL LOW (ref 4.22–5.81)
RDW: 13.7 % (ref 11.5–15.5)
WBC: 4.8 10*3/uL (ref 4.0–10.5)
nRBC: 0 % (ref 0.0–0.2)

## 2019-04-10 LAB — BASIC METABOLIC PANEL
Anion gap: 9 (ref 5–15)
BUN: 14 mg/dL (ref 8–23)
CO2: 25 mmol/L (ref 22–32)
Calcium: 8.8 mg/dL — ABNORMAL LOW (ref 8.9–10.3)
Chloride: 107 mmol/L (ref 98–111)
Creatinine, Ser: 0.79 mg/dL (ref 0.61–1.24)
GFR calc Af Amer: >60 mL/min (ref >60)
GFR calc non Af Amer: >60 mL/min (ref >60)
Glucose, Bld: 104 mg/dL — ABNORMAL HIGH (ref 70–99)
Potassium: 3.6 mmol/L (ref 3.5–5.1)
Sodium: 141 mmol/L (ref 135–145)

## 2019-04-10 MED ORDER — TRAMADOL HCL 50 MG PO TABS: 50.0000 mg | ORAL_TABLET | Freq: Two times a day (BID) | ORAL | Status: DC | PRN

## 2019-04-10 NOTE — Progress Notes (Signed)
Occupational Therapy Discharge Summary  Patient Details  Name: DAMEL QUERRY MRN: [id number] Date of Birth: 1956/05/27  Patient has met 77 of 56 long term goals due to improved activity tolerance, improved balance, ability to compensate for deficits, functional use of  LEFT upper and LEFT lower extremity and improved awareness.  Pt made steady progress with BADLs, functional transfers, and safety awareness during this admission. Pt requires min A for LB dressing tasks (fastening and donning shoes) but completes all other tasks, including functional transfers, at supervisoin level.  Pt's wife has been present during therapy session. Pt fatigues quickly and requires multiple rest breaks during tasks. Pt requires min verbal cues for employing breathing strategies/techniques. Patient to discharge at overall Supervision overall, Min assist for LB dressing level.  Patient's care partner is independent to provide the necessary physical and cognitive assistance at discharge.     Recommendation:  Patient will benefit from ongoing skilled OT services in home health setting to continue to advance functional skills in the area of BADL and Reduce care partner burden.  Equipment: BSC  Reasons for discharge: treatment goals met and discharge from hospital  Patient/family agrees with progress made and goals achieved: Yes  OT Discharge Vision Baseline Vision/History: Wears glasses Wears Glasses: Distance only Patient Visual Report: No change from baseline Vision Assessment?: No apparent visual deficits Eye Alignment: Within Functional Limits Ocular Range of Motion: Within Functional Limits Alignment/Gaze Preference: Within Defined Limits Tracking/Visual Pursuits: Able to track stimulus in all quads without difficulty Saccades: Within functional limits Visual Fields: No apparent deficits Perception  Perception: Within Functional Limits Praxis Praxis: Intact Cognition Overall Cognitive Status:  Within Functional Limits for tasks assessed Arousal/Alertness: Awake/alert Orientation Level: Oriented X4 Attention: Selective Selective Attention: Appears intact Selective Attention Impairment: Functional basic Memory: Impaired(although Mod I for use of strategies) Memory Impairment: Decreased short term memory Immediate Memory Recall: Sock;Blue;Bed Memory Recall Sock: Without Cue Memory Recall Blue: Without Cue Memory Recall Bed: With Cue Awareness: Appears intact Problem Solving: Appears intact Problem Solving Impairment: Verbal complex;Functional basic Executive Function: Writer: Impaired Organizing Impairment: Verbal complex Safety/Judgment: Appears intact Sensation Sensation Light Touch: Impaired by gross assessment Light Touch Impaired Details: Impaired RLE;Impaired LLE Hot/Cold: Appears Intact Proprioception: Appears Intact Stereognosis: Not tested Coordination Gross Motor Movements are Fluid and Coordinated: Yes Fine Motor Movements are Fluid and Coordinated: Yes Motor  Motor Motor - Skilled Clinical Observations: L hemiperisis and decreased activity tolerance    Trunk/Postural Assessment  Cervical Assessment Cervical Assessment: (forward head) Thoracic Assessment Thoracic Assessment: (rounded shoulders) Lumbar Assessment Lumbar Assessment: (posterior pelvic tilt)  Balance Static Sitting Balance Static Sitting - Balance Support: Feet supported Static Sitting - Level of Assistance: 5: Stand by assistance Dynamic Sitting Balance Dynamic Sitting - Balance Support: During functional activity Dynamic Sitting - Level of Assistance: 5: Stand by assistance Extremity/Trunk Assessment RUE Assessment RUE Assessment: Within Functional Limits LUE Assessment LUE Assessment: Within Functional Limits   Leroy Libman 04/10/2019, 12:10 PM

## 2019-04-10 NOTE — Consult Note (Signed)
Neuropsychological Consultation   Patient:   Martin [name]   DOB:   21-May-1956  MR Number:  [id number]  Location:  [facility] [address] [facility] [address] [place] [id number] Martin [name]  [id number] Dept: [place]: [phone]           Date of Service:   04/10/2019  Start Time:   8 AM End Time:   9 AM  Provider/Observer:  Ilean Skill, Psy.D.       Clinical Neuropsychologist       Billing Code/Service: [id number]/[id number]  Chief Complaint:    [place] Tid[place] is a 63 year old male with a history of CAD/PAF, OSA who was admitted on 03/07/2019 with CP and had mild elevations in troponins with stable EKG changes concerning for NSTEMI.  The patient underwent cardiac cath revealing severe 3 V CAD and underwent CABG x3 on 04/07/2019.  He tolerated extubation without difficulty but had confusion and was noted to have left-sided weakness on 10/5.  CT head done revealing small acute cortical distal right ACA territory infarct and progressive small vessel disease.  Follow-up CT was without bleed and therefore heparin was added on 10/6 and transition to Eliquis by 10/8.  EEG was done due to somnolence and was negative for seizure.  The patient has been dealing with significant debility and he c had continued to require oxygen up until recently.  The patient has gaining strength and actively working on rehabilitation efforts within the CIR.  04/10/2019:  The patient reports continued issues with sleep he attributes to anxiety and noise.  Reports that he is doing better and ready to go home tomorrow.  Has home navigation worked out and has good family help with wife and son.    Reason for Service:  The patient was referred for neuropsychological consultation due to anxiety and coping issues.  Below is the HPI for the current admission.  [id number] Martin [name] is a 63 year old male with history of CAD/PAF-on Eliquis, OSA, who was admitted  on 03/07/19 with CP and had mild elevation in troponins with subtle EKG changes concerning for NSTEMI. 2D echo showed mild increase in LVH with basal inferior akinesis and EF 55-60%. He underwent cardiac cath revealing severe 3V CAD and underwent CABG X 3 by Dr. Prescott Gum on 03/11/19. He tolerated extubation with out difficulty but had confusion and was noted to have left sided weakness on 10/5 am. CT head done revealing small acute cortical distal R-ACA territory infarct and progressive small vessel disease. CTA head/neck was negative for LVO, revealed new focus of calcification in R-ACA distal A2 segment which could indicated partially calcified thrombus, fetal origins PCA as well as incidental findings of large loculated component of left effusion and retrosternal 4.3X 2.0X 5.2 cm fluid collection favored to be seroma. He developed A fib with RVR as well as VT and was started on amiodarone on 10/5. Cardizem added for rate control and due to low BP maintained on milrinone due to fluid overload. Dr. Erlinda Hong recommends BP goal 110-140 to maintain adequate cerebral perfusion.   Follow up CT head without bleed therefore heparin added on 10/06 and transitioned to Eliquis by 10/08. EEG done due to somnolence and was negative for seizures. He has had issues with urinary retention requiring I/O caths and foley replaced. He did develop progressive leucocytosis with WBC- 20.1 on 10/13 thereforestarted ondoxycycline andCiproadded 10/15.UCS negative and CXR done revealing stable bilateral effusion snd stable nodular density left  mid lung possibly hematoma at side of prior chest tube.Leucocytosis resolving but 10/15 pm, he did have unresponsive episode with seizure type episode and 14.5 seconds asystole felt to be vasovagal in nature. Amiodaroneand BB d/c briefly due to bradycardia and ongoing A fib--as HR stable amiodarone resumed 10/18 and low dose BB resumed today.He continue to require oxygen per [place] due to and  is noted to be limited by left hemiparesis with shuffling gait and SOB with activity. CIR recommended due to functional decline.  Current Status:  Anxiety has improved some but continues and was pre-existing.    Behavioral Observation: Martin [name]  presents as a 63 y.o.-year-old Right Caucasian Male who appeared his stated age. his dress was Appropriate and he was Well Groomed and his manners were Appropriate to the situation.  his participation was indicative of Appropriate and Attentive behaviors.  There were any physical disabilities noted.  he displayed an appropriate level of cooperation and motivation.     Interactions:    Active Appropriate and Attentive  Attention:   within normal limits and attention span and concentration were age appropriate  Memory:   within normal limits; recent and remote memory intact  Visuo-spatial:  not examined  Speech (Volume):  normal  Speech:   normal; normal  Thought Process:  Coherent and Relevant  Though Content:  WNL; not suicidal and not homicidal  Orientation:   person, place, time/date and situation  Judgment:   Good  Planning:   Good  Affect:    Anxious  Mood:    Anxious  Insight:   Good  Intelligence:   normal  Medical History:   Past Medical History:  Diagnosis Date  . Atrial fibrillation ([place])    a. dx 10/2017. b. s/p DCCV on 11/19/2017 with successful conversion to NSR but back in AFIB 3 weeks later  . CAD (coronary artery disease)    a. s/p PCI to LAD 2001. b. s/p stenting to the RCA and mid LAD December 2011, residual distal RCA disease treated medically. b. nuc 02/2013 abnormal with fixed defect seen in inferoapical, mid-inferior, and basal inferior walls, indicative of myocardial scar, no evidence of ischemia, EF 41% (Ef 55-60% by echo same time).  . Carotid artery disease ([place])    carotid Doppler course left side 5069% stenosis  . Dysrhythmia    AFib  . Habitual alcohol use   . Hyperlipidemia   . Hypertension    . Low back pain   . OSA on CPAP   . Osteoarthritis   . Polysubstance abuse ([place])    use including marijuana and vicodin,which he obtained from the street  . PVC's (premature ventricular contractions)    status post Holter monitor normal sinus rhythm otherwise asymptomatic PVCs.  . Seasonal allergies   Psychiatric History:  The patient does report a history of anxiety through the years that are likely related to a generalized anxiety disorder.  Family Med/Psych History:  Family History  Problem Relation Age of Onset  . Stroke Mother   . Other Mother        small bowel obstruction  . Heart attack Father   . Heart attack Brother     Impression/DX:  45 M. Outen is a 63 year old male with a history of CAD/PAF, OSA who was admitted on 03/07/2019 with CP and had mild elevations in troponins with stable EKG changes concerning for NSTEMI.  The patient underwent cardiac cath revealing severe 3 V CAD and underwent CABG x3 on 04/07/2019.  He tolerated extubation without difficulty but had confusion and was noted to have left-sided weakness on 10/5.  CT head done revealing small acute cortical distal right ACA territory infarct and progressive small vessel disease.  Follow-up CT was without bleed and therefore heparin was added on 10/6 and transition to Eliquis by 10/8.  EEG was done due to somnolence and was negative for seizure.  The patient has been dealing with significant debility and he c had continued to require oxygen up until recently.  The patient has gaining strength and actively working on rehabilitation efforts within the CIR.  The patient reports continued issues with sleep he attributes to anxiety and noise.  Reports that he is doing better and ready to go home tomorrow.  Has home navigation worked out and has good family help with wife and son.  Disposition/Plan:  Have explained to patient that if anxiety remains high and impacts his ability to continue to do therapies or home  activities needed to keep improving, I would be available outpatient.    Diagnosis:    Debility - Plan: Ambulatory referral to Physical Medicine Rehab  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 ([place]) - Plan: Ambulatory referral to Physical Medicine Rehab  SOB (shortness of breath) - Plan: DG Chest 2 View, DG Chest 2 View         Electronically Signed   _______________________ Ilean Skill, Psy.D.

## 2019-04-10 NOTE — Progress Notes (Addendum)
Speech Language Pathology Discharge Summary  Patient Details  Name: Martin [name] MRN: [id number] Date of Birth: 06/29/1955  Today's Date: 04/10/2019 SLP Individual Time: 1002-1055 SLP Individual Time Calculation (min): 53 min   Skilled Therapeutic Interventions:  Pt was seen for skilled ST targeting cognitive goals. Pt able to recall weekend ST targets/activities with Mod I for use of schedule as compensatory aid to recall name of therapist he saw. He also verbally recalled familiar compensatory memory strategies (WRAP) with Mod I for use of external aid. During a scavenger hunt on the rehab unit, pt was also Mod I for recalling familiar places and objects throughout the unit (ex: find the gym, speech office, elevators, room, etc.). Upon returning to his room, SLP also engaged pt in functional conversation regarding discharge. He demonstrated excellent insight into current deficits and able to anticipate his equipment needs as well as carryover of skills learned in CIR/how they will translate to home functioning. Furthermore, pt able to selectively attend to tasks in mildly and moderately distracting environments without cueing needed from redirection. Pt was left sitting in wheelchair with alarm set and all needs within reach. Continue per current plan of care.     Patient has met 4 of 4 long term goals.  Patient to discharge at overall Modified Independent;Supervision level.  Reasons goals not met: n/a   Clinical Impression/Discharge Summary:   Pt made excellent functional gains and met 4 out of 4 long term goals this admission. Pt currently is currently Mod I-Supervision assist for complex cognitive tasks and will require 24/7 supervision. Pt has demonstrated improved problem solving, selective attention, safety and anticipatory awareness, and use of compensatory memories strategies for functional day to day recall of information. Although pt will benefit from 24/7 supervision for complex  cognitive tasks, no follow up ST is indicated at this time. Pt and family education is complete.   Care Partner:  Caregiver Able to Provide Assistance: Yes  Type of Caregiver Assistance: Physical;Cognitive  Recommendation:  24 hour supervision/assistance      Equipment: none   Reasons for discharge: Discharged from hospital   Patient/Family Agrees with Progress Made and Goals Achieved: Yes    Erin E Smith 04/10/2019, 12:08 PM    

## 2019-04-10 NOTE — Progress Notes (Signed)
Occupational Therapy Session Note  Patient Details  Name: Martin [name] MRN: [id number] Date of Birth: 1955-12-01  Today's Date: 04/10/2019 OT Individual Time: 1130-1155 OT Individual Time Calculation (min): 25 min    Short Term Goals: Week 2:  OT Short Term Goal 1 (Week 2): STG=LTG secondary to ELOS  Skilled Therapeutic Interventions/Progress Updates:    Pt resting in w/c upon arrival.  OT intervention with focus on ongoing discharge planning, stress/anxiety management, safety recommendations, and general safety awareness. Pt agrees that it will be best to await HHOT before attempting shower at home.  Reminded pt about breathing strategies and techniques. Pt recalled sternal precautions but continues to required min verbal cues when engaged in functional task.   Therapy Documentation Precautions:  Precautions Precautions: Fall, Sternal Precaution Booklet Issued: No Precaution Comments: Lft hemiparesis Restrictions Weight Bearing Restrictions: No Other Position/Activity Restrictions: sternal precautions   Pain:  Pt denies pain   Therapy/Group: Individual Therapy  Leroy Libman 04/10/2019, 11:57 AM

## 2019-04-10 NOTE — Progress Notes (Signed)
Occupational Therapy Session Note  Patient Details  Name: Martin [name] MRN: [id number] Date of Birth: June 15, 1955  Today's Date: 04/10/2019 OT Individual Time: 0700-0800 OT Individual Time Calculation (min): 60 min    Short Term Goals: Week 2:  OT Short Term Goal 1 (Week 2): STG=LTG secondary to ELOS  Skilled Therapeutic Interventions/Progress Updates:    PT engaged in bathing at shower level, dressing with sit<>stand from w/c, toilet transfers, and toileting tasks this morning in preparation for discharge home tomorrow.  Pt requires assistance with donning and fastening shoes but completes all other tasks at supervision level.  Pt amb with RW to bathroom to use toilet and then transfer to shower.  Pt uses AE appropriately to assist with bathing and dressing tasks.  Continued energy conservation and breathing technique education.  Pt demonstrates understanding of recommendations with min verbal cues. Pt SOB with minimal activity but employs pursed breathing techniques during rest periods. Pt pleased with progress and ready for discharge tomorrow. Pt remained in w/c with seat alarm activated and all needs within reach.   Therapy Documentation Precautions:  Precautions Precautions: Fall, Sternal Precaution Booklet Issued: No Precaution Comments: Lft hemiparesis Restrictions Weight Bearing Restrictions: No Other Position/Activity Restrictions: sternal precautions  Pain: Pain Assessment Pain Score: (P) 0-No pain   Therapy/Group: Individual Therapy  Leroy Libman 04/10/2019, 8:26 AM

## 2019-04-10 NOTE — Progress Notes (Signed)
[facility] PHYSICAL MEDICINE & REHABILITATION PROGRESS NOTE   Subjective/Complaints:  Patient very positive and content this morning. Eager to go home tomorrow. Has no complaints. Happy that he will be getting home therapy and wants to continue improving his left side strength, particularly in his left leg. Has grab bars set up in his shower at home and would like to get a bath seat as well for his safety.    ROS: Patient denies fever, rash, sore throat, blurred vision, nausea, vomiting, diarrhea, cough, chest pain, joint or back pain, headache, or mood change.    Objective:    No results found. Recent Labs    04/10/19 0454  WBC 4.8  HGB 10.7*  HCT 33.4*  PLT 177   Recent Labs    04/10/19 0454  NA 141  K 3.6  CL 107  CO2 25  GLUCOSE 104*  BUN 14  CREATININE 0.79  CALCIUM 8.8*    Intake/Output Summary (Last 24 hours) at 04/10/2019 0945 Last data filed at 04/10/2019 [id number] Gross per 24 hour  Intake 862 ml  Output 1150 ml  Net -288 ml     Physical Exam: Vital Signs Blood pressure (!) (P) 168/89, pulse (P) 93, temperature 98.3 F (36.8 C), temperature source Oral, resp. rate 18, weight (P) 118 kg, SpO2 (P) 94 %. Constitutional: No distress . Demonstrates excellent attitude and desire to improve.  Sitting up in manual w/c, done with eating, on 1L O2 by [place], NAD HEENT: EOMI, conjugate gaze Neck: supple Cardiovascular: IR- rate controlled  Respiratory: CTA Bilaterally. Normal effort   GI: BS +, non-tender, non-distended  Skin: Healing abrasions bilateral LE's Psych: slightly anxious/jittery, but stable. Musc: LLE edema 2-3+, L>R, but RLE still ~2+ (per pt no worse); TEDS in place Right ankle pain improved Neurological: Alert Motor: LUE 4+/5 proximal to distal.  LLE: 3+/5 HF, 4-/5KE and 0/5 ADF RUE: 5/5 proximal to distal, stable RLE: 4-4+/5 proximal to distal.   Assessment/Plan: 1. Functional deficits secondary to right ACA infarct which require 3+ hours per  day of interdisciplinary therapy in a comprehensive inpatient rehab setting.  Physiatrist is providing close team supervision and 24 hour management of active medical problems listed below.  Physiatrist and rehab team continue to assess barriers to discharge/monitor patient progress toward functional and medical goals  Care Tool:  Bathing    Body parts bathed by patient: Right arm, Left arm, Chest, Abdomen, Front perineal area, Right upper leg, Left upper leg, Face, Right lower leg, Left lower leg, Buttocks   Body parts bathed by helper: Buttocks     Bathing assist Assist Level: Supervision/Verbal cueing     Upper Body Dressing/Undressing Upper body dressing   What is the patient wearing?: Pull over shirt    Upper body assist Assist Level: Independent    Lower Body Dressing/Undressing Lower body dressing      What is the patient wearing?: Pants, Underwear/pull up     Lower body assist Assist for lower body dressing: Supervision/Verbal cueing     Toileting Toileting Toileting Activity did not occur (Clothing management and hygiene only): N/A (no void or bm)  Toileting assist Assist for toileting: Supervision/Verbal cueing     Transfers Chair/bed transfer  Transfers assist     Chair/bed transfer assist level: Contact Guard/Touching assist Chair/bed transfer assistive device: Programmer, multimedia   Ambulation assist   Ambulation activity did not occur: Safety/medical concerns(fatigue/SOB/R ankle pain)  Assist level: Supervision/Verbal cueing Assistive device: Walker-rolling Max distance:  100'   Walk 10 feet activity   Assist  Walk 10 feet activity did not occur: Safety/medical concerns(fatigue/SOB/R ankle pain)  Assist level: Supervision/Verbal cueing Assistive device: Walker-rolling   Walk 50 feet activity   Assist Walk 50 feet with 2 turns activity did not occur: Safety/medical concerns(R ankle pain and decreased activity  tolerance)  Assist level: Contact Guard/Touching assist Assistive device: Walker-rolling    Walk 150 feet activity   Assist Walk 150 feet activity did not occur: Safety/medical concerns(R ankle pain and decreased activity tolerance)  Assist level: Contact Guard/Touching assist Assistive device: Walker-rolling    Walk 10 feet on uneven surface  activity   Assist Walk 10 feet on uneven surfaces activity did not occur: Safety/medical concerns(R ankle pain and decreased activity tolerance)         Wheelchair     Assist     Wheelchair activity did not occur: Safety/medical concerns(Sternal precautions)         Wheelchair 50 feet with 2 turns activity    Assist    Wheelchair 50 feet with 2 turns activity did not occur: Safety/medical concerns(Sternal precautions)       Wheelchair 150 feet activity     Assist  Wheelchair 150 feet activity did not occur: Safety/medical concerns(Sternal precautions)       Blood pressure (!) (P) 168/89, pulse (P) 93, temperature 98.3 F (36.8 C), temperature source Oral, resp. rate 18, weight (P) 118 kg, SpO2 (P) 94 %.  Medical Problem List and Plan: 1.Left hemiparesis and decreased mobilitysecondary to right ACA infarct after CABG  Continue CIR PRAFO LLE 2. Antithrombotics: -DVT/anticoagulation:Pharmaceutical:Other (comment)--Eliquis -antiplatelet therapy: On low dose ASA. 3. Pain Management:Oxycodone prn 4. Mood:LCSW to follow for evaluation and support. -antipsychotic agents: N/A 5. Neuropsych: This patientiscapable of making decisions onhisown behalf. 6. Skin/Wound Care:Monitor wound for healing. Continue protein supplement to help promote healing. PRAFO LLE 7. Fluids/Electrolytes/Nutrition:encourage PO  10/26- labs still pending- will see by tomorrow  10/29- labs look great  11/2: labs reviewed and stable 8. CAD s/p CABG:Continue sternal  precautions. ON ASA and Lipitor. 9. HTN:BP goal 110-140 to allow for adequate cerebral perfusion.  BP borderline 10/25---observe  10/28- will maintain BP due to goal for cerebral perfusion  10/31- BP 158/86- will not change meds to maintain cerebral perfusion.   11/1- better on its own- 140s/80s  11/2: will maintain BP due to goal for cerebral perfusion 10 Leukocytosis:Resolved  11.Diastolic heart failure: Strict I/O and daily weights. On Lasix daily with low dose BB.  -cxr reviewed---bilateral effusions, no obvious edema Filed Weights   04/08/19 0556 04/09/19 0433 04/10/19 0534  Weight: 117.4 kg 120.8 kg (P) 118 kg    Weights trending up until 10/23.  Pt diuresed quite a bit after IV lasix Friday night.   - 10/25 pt is -315 for last 24 hrs. Still looks a little fluid overloaded   -increase lasix to 20mg  bid  10/26- 4 kg higher in 2 days- will give another 40 mg Lasix x1 and follow closely.  10/27- down 2 kgs- will monitor and wait on more lasix  10/28- down another 1+ kg- maintain- will recheck labs in AM  10/31- Weight down to 117 kg  11/1- Weight up 3 kg- his swelling looks the same, however.Will monitor in AM for trend 12. Abnormal LFTs: WNL 10/20  13. Malnutrition: Pre-albumin- 14.1 14. A fib: Monitor for recurrent bradycardia--on Eliquis bid and back on amiodarone and metoprolol daily 15. Persistent hyponatremia:   Sodium 136  on 10/20  Na 138 on 10/29  141 on 11/2 16. OSA/Hypoxia: PFTs with moderate to severe obstructive airway disease--now on Dulera bid.   CPAP   Wean supplemental oxygen as tolerated, continues to require  10/28- off O2 by [place]             11/2: Continues to breathe well on RA 17. Urinary retention:DC'ed foley---> bladder training.   Ua negative, ucx no growth  Flomax increased on 10/21  Voiding with low pvr's now 18. Acute Blood Loss Anemia  Hb 10.3 on 10/20  Hb trending upward to 10.7 on 11/2.  19. Hypoalbuminemia  Supplement initiated on  10/21 20. Acute Gout Flare  Cont allopurinol   Colchicine started on 10/21  Improved--watch with diuresis  10/26- improved 21.  Morbid obesity: Encouraged weight loss 22. Borderline Hypokalemia  10/29- K+ 3.5- has been trending down last few lab draws- will give 1x KCl.   11/2: K+ 3.6 23. Insomnia- will increase Trazadone to 100 mg  10/31- better at higher dose  11/1- still can't sleep  11/2: slept better last night.  24. Dispo  10/31- pt wants flu shot- ordered  11/1- d/c Tuesday    LOS: 14 days A FACE TO FACE EVALUATION WAS [place] 04/10/2019, 9:45 AM

## 2019-04-10 NOTE — Progress Notes (Deleted)
  Patient ID: Martin [name], male   DOB: January 19, 1956, 63 y.o.   MRN: [id number]    Diagnosis codes:  I69.354;  Z95.1  Height:  5'8"           Weight:   267 obs         Patient suffers from right CVA following a CABG which impairs their ability to perform daily activities like toileting, bathing, dressing and mobility in the home.  A walker will not resolve issue with performing activities of daily living.  A wheelchair will allow patient to safely perform daily activities.  Patient is not able to propel themselves in the home using a standard weight wheelchair due to significant weakness.  Patient can self propel in the lightweight wheelchair.  Reesa Chew, PA-C

## 2019-04-10 NOTE — Plan of Care (Signed)
  Problem: Consults Goal: RH STROKE PATIENT EDUCATION Description: See Patient Education module for education specifics  Outcome: Progressing   Problem: RH SKIN INTEGRITY Goal: RH STG SKIN FREE OF INFECTION/BREAKDOWN Outcome: Progressing Goal: RH STG MAINTAIN SKIN INTEGRITY WITH ASSISTANCE Description: STG Maintain Skin Integrity With min Assistance. Outcome: Progressing   Problem: RH PAIN MANAGEMENT Goal: RH STG PAIN MANAGED AT OR BELOW PT'S PAIN GOAL Description: Less than 3 out of 10 Outcome: Progressing   Problem: RH KNOWLEDGE DEFICIT Goal: RH STG INCREASE KNOWLEDGE OF HYPERTENSION Description: Patient able to state 2 methods of controlling high blood pressure Outcome: Progressing Goal: RH STG INCREASE KNOWLEGDE OF HYPERLIPIDEMIA Description: Pt will be able to demonstrate medication compliance and diet regimen to manage HLD Outcome: Progressing Goal: RH STG INCREASE KNOWLEDGE OF STROKE PROPHYLAXIS Description: Pt will be able to verbalize medication understanding and 3 signs of stroke to prevent stroke reoccuring Outcome: Progressing

## 2019-04-10 NOTE — Progress Notes (Signed)
Patient has home CPAP at bedside and stated he doesn't need any assistance at this time. Patient stated he is able to apply himself and will have RN connect O2 when he is ready to apply.  RT advised RN of plan.

## 2019-04-10 NOTE — Progress Notes (Signed)
Physical Therapy Session Note  Patient Details  Name: Martin [name] MRN: [id number] Date of Birth: 07-31-1955  Today's Date: 04/10/2019 PT Individual Time: 1302-1430 PT Individual Time Calculation (min): 88 min   Short Term Goals: Week 2:  PT Short Term Goal 1 (Week 2): STG=LTG due to ELOS.  Skilled Therapeutic Interventions/Progress Updates:     Patient in w/c with his wife in the room upon PT arrival. Patient alert and agreeable to PT session. Patient reported increased fatigue due to several days with poor sleep and 3-4/10 headache during session, RN made aware and provided Martin [name] during session. PT provided repositioning, rest breaks, and distraction as pain interventions throughout session. Patient's wife participated in family education throughout session.  Therapeutic Activity: Bed Mobility: Patient performed supine to/from sit with min A in a flat bed without rails x1 and with the HOB elevated, to simulate function of his bed at home, with supervision sit to supine and min A supine to sit x2. Provided verbal cues for rolling use of gait belt to lift his LEs into the bed and to perform pursed lip breathing throughout as patient tends to hold his breath during this activity. Patient's wife was able to provide light min A for supine to sit with safe guarding and supervision for sit to supine x1 with HOB elevated.  Transfers: Patient performed sit to/from stand x 5 with supervision using the RW with posterior LOB x1 with patient able to self correct. Provided verbal cues for guarding technique and use of gait belt for safety at home. Patient wife demonstrated safe guarding technique x1 trial. He performed a simulated car transfer with seat height set to 27" with a small step up to simulated stepping up into the vehicle to simulate personal vehicle with mod A due to LOB using step up. Discussed vehicle options and patient stated that he did not previously have to step-up into the vehicle and  requested trying the car transfer without stepping up. Patient them performed the same transfer without using the step up with min A-CGA, assist only for LE management due to elevated floor board in car simulator. Demonstrated that patient could lean the seat back to assist with bringing his feet in if floor board is also elevated on is personal vehicle. Patient stated he was too fatigued to perform a second trial with this technique, but stated understanding.  He performed a furniture transfer using the recliner in the ADL apartment with 2 pillows to increased the seat height to simulate home environment with CGA and cues for scooting forward and leaning far forward.  All transfers were performed without use of UEs to maintain sternal precautions.   Gait Training:  Patient ambulated 55 feet using RW with supervision, limited distance due to increased fatigue today. Ambulated with decreased gait speed, decreased step length and height on L, increased L toe out, forward trunk lean, and downward head gaze. Provided verbal cues for pace breathing throughout and increased L step height to reduce fall risk. He went up 3-6" steps using a RW ascending backwards and leading with his R foot with CGA for safety to simulate entering his home. He descending the stair using B rails leading with the L foot with CGA, His son is putting in a rail after patient d/c. Patient then went up/down 3-6" steps using a L rail to simulate home entrance with 1 rail with CGA performing side-stepping technique leading with the R while ascending and L while descending. PT demonstrated safe  guarding technique throughout. Patient's wife stated that she would instruct her son on guarding technique at d/c.  Wheelchair Mobility:  Patient was transported in the w/c with total A by PT and his wife throughout session to maintain sternal precautions and simulate use of a transport chair within the community with his wife.  Patient requested to  use the restroom at end of session, PT provided supervision to/form the bathroom and while patient stood to void. He was in the w/c with his wife in the room at end of session with breaks locked, chair alarm set, and all needs within reach. Educated on fall risk/prevention, home modifications, activation of emergency response in the event of a fall, and energy conservation techniques throughout session.   Therapy Documentation Precautions:  Precautions Precautions: Fall, Sternal Precaution Booklet Issued: No Precaution Comments: Lft hemiparesis Restrictions Weight Bearing Restrictions: No Other Position/Activity Restrictions: sternal precautions     Therapy/Group: Individual Therapy  Martin [name] L Frutoso Dimare PT, DPT  04/10/2019, 4:51 PM

## 2019-04-11 ENCOUNTER — Inpatient Hospital Stay (HOSPITAL_COMMUNITY): Payer: Medicare Other | Admitting: Speech Pathology

## 2019-04-11 MED ORDER — METOPROLOL TARTRATE 25 MG PO TABS: 12.5000 mg | ORAL_TABLET | Freq: Every morning | ORAL | 0 refills | Status: DC

## 2019-04-11 MED ORDER — CITALOPRAM HYDROBROMIDE 10 MG PO TABS: 10.0000 mg | ORAL_TABLET | Freq: Every day | ORAL | 0 refills | Status: DC

## 2019-04-11 MED ORDER — DOCUSATE SODIUM 100 MG PO CAPS: 200.0000 mg | ORAL_CAPSULE | Freq: Every day | ORAL | 0 refills | Status: DC

## 2019-04-11 MED ORDER — TAMSULOSIN HCL 0.4 MG PO CAPS: 0.4000 mg | ORAL_CAPSULE | Freq: Two times a day (BID) | ORAL | 0 refills | Status: DC

## 2019-04-11 MED ORDER — DICLOFENAC SODIUM 1 % TD GEL: 2.0000 g | Freq: Four times a day (QID) | TRANSDERMAL | 1 refills | Status: DC

## 2019-04-11 MED ORDER — TRAZODONE HCL 100 MG PO TABS: 100.0000 mg | ORAL_TABLET | Freq: Every evening | ORAL | 0 refills | Status: DC | PRN

## 2019-04-11 MED ORDER — COLCHICINE 0.6 MG PO TABS: 0.6000 mg | ORAL_TABLET | Freq: Every day | ORAL | 0 refills | Status: DC

## 2019-04-11 MED ORDER — AMIODARONE HCL 200 MG PO TABS: 200.0000 mg | ORAL_TABLET | Freq: Every day | ORAL | 0 refills | Status: DC

## 2019-04-11 MED ORDER — PRO-STAT SUGAR FREE PO LIQD: 30.0000 mL | Freq: Two times a day (BID) | ORAL | 0 refills | Status: DC

## 2019-04-11 MED ORDER — THIAMINE HCL 100 MG PO TABS: 100.0000 mg | ORAL_TABLET | Freq: Every day | ORAL | 0 refills | Status: DC

## 2019-04-11 MED ORDER — ASPIRIN 81 MG PO TBEC: 81.0000 mg | DELAYED_RELEASE_TABLET | Freq: Every day | ORAL | 1 refills | Status: DC

## 2019-04-11 MED ORDER — MOMETASONE FURO-FORMOTEROL FUM 200-5 MCG/ACT IN AERO: 2.0000 | INHALATION_SPRAY | Freq: Two times a day (BID) | RESPIRATORY_TRACT | 0 refills | Status: DC

## 2019-04-11 MED ORDER — POTASSIUM CHLORIDE CRYS ER 15 MEQ PO TBCR: 30.0000 meq | EXTENDED_RELEASE_TABLET | Freq: Every day | ORAL | 0 refills | Status: DC

## 2019-04-11 MED ORDER — FUROSEMIDE 20 MG PO TABS: 20.0000 mg | ORAL_TABLET | Freq: Two times a day (BID) | ORAL | 0 refills | Status: DC

## 2019-04-11 MED ORDER — GUAIFENESIN ER 600 MG PO TB12: 600.0000 mg | ORAL_TABLET | Freq: Two times a day (BID) | ORAL | 0 refills | Status: DC | PRN

## 2019-04-11 MED ORDER — PANTOPRAZOLE SODIUM 40 MG PO TBEC: 40.0000 mg | DELAYED_RELEASE_TABLET | Freq: Every day | ORAL | 0 refills | Status: DC

## 2019-04-11 NOTE — Discharge Summary (Signed)
Physician Discharge Summary  Patient ID: Martin [name] MRN: [id number] DOB/AGE: 10-29-1955 63 y.o.  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 Pleural effusions. EXAM: CHEST - 2 VIEW COMPARISON:  March 21 2019, March 18, 2019, and March 14, 2019. FINDINGS: There is persistent loculated effusion in the left major fissure with small left pleural effusion elsewhere. There is a right pleural effusion. There is patchy airspace consolidation in the left base region. There is cardiomegaly with pulmonary vascularity normal. No adenopathy. No pneumothorax. Patient is status post coronary artery bypass grafting. There is mild anterior wedging of several lower thoracic vertebral bodies. IMPRESSION: Bilateral pleural effusions. Note that fluid tracks into the left major fissure, similar to recent prior studies. There is apparent consolidation in the left base, at least partially due to atelectasis. Stable cardiomegaly. No pneumothorax. Postoperative coronary artery bypass grafting. Electronically Signed   By: Lowella Grip III M.D.   On: 03/31/2019 21:48      Labs:  Basic Metabolic Panel: BMP Latest Ref Rng & Units 04/10/2019 04/06/2019 04/04/2019  Glucose 70 - 99 mg/dL 104(H) 115(H) 134(H)  BUN 8 - 23 mg/dL 14 12 14   Creatinine 0.61 - 1.24 mg/dL 0.79 0.81 0.84  Sodium 135 - 145 mmol/L 141  138 138  Potassium 3.5 - 5.1 mmol/L 3.6 3.5 3.6  Chloride 98 - 111 mmol/L 107 102 100  CO2 22 - 32 mmol/L 25 27 31   Calcium 8.9 - 10.3 mg/dL 8.8(L) 9.1 9.1    CBC: CBC Latest Ref Rng & Units 04/10/2019 04/06/2019 04/03/2019  WBC 4.0 - 10.5 K/uL 4.8 4.5 6.2  Hemoglobin 13.0 - 17.0 g/dL 10.7(L) 10.9(L) 11.8(L)  Hematocrit 39.0 - 52.0 % 33.4(L) 33.7(L) 37.4(L)  Platelets 150 - 400 K/uL 177 205 243    CBG: No results for input(s): GLUCAP in the last 168 hours.  Brief HPI:   Martin [name] is a 63 y.o. male with history of CAD/PAF- on Eliquis, OSA who was admitted on 03/07/19 with chest pain and elevation in troponins due to NSTEMI. He underwent cardiac cath revealing severe 3V CAD and underwent CABG X 3 by Dr. Prescott Gum on 03/11/19. CT head done showing small acute cortical distal R-ACA territory infarct and CTA negative for LVO. He did develop A fib with RVR and VT requiring addition of Amiodarone as well as Cardizem for rate control. Fluid overload treated with milrinone due to low BP.  Dr. Erlinda Hong recommended BP goal 110-140 to maintain adequate cerebral perfusion.    Follow-up CT head was negative for bleed therefore heparin added and patient transition to Eliquis.  Patient has had issues with urinary retention requiring replacement of Foley, progressive leukocytosis treated with antibiotics, unresponsive episode due to asystole felt to be vasovagal in nature, ongoing issues with hypoxia as well as development of left ankle pain.  Therapy ongoing and patient limited by left hemiparesis which shuffling gait as well as  shortness of breath with activity.  CIR recommended due to functional decline.   Hospital Course: Martin [name] was admitted to rehab 03/27/2019 for inpatient therapies to consist of PT, ST and OT at least three hours five days a week. Past admission physiatrist, therapy team and rehab RN have worked together to provide customized collaborative inpatient rehab.  He was maintained  on Eliquis as well as low-dose aspirin during his stay.  Serial CBC showed mild drop in H&H and recommend repeat CBC in 1 to 2 weeks to monitor for stability.  Sternal incision is intact and is healing without signs or symptoms of infection.  He continued to report left ankle pain which did not respond to local measures and was found to have gout flare.  Colchicine added with resolution of gout flare.     Foley was discontinued on 10/20 and voiding trial initiated.  Flomax was increased to twice daily and he is currently voiding without difficulty.  Blood pressures and heart rates have been monitored on twice daily basis and have been controlled on current regimen.  Respiratory status is gradually improved and he was weaned off of oxygen by discharge.  He was advised to continue using Dulera twice daily due to evidence of severe obstructive lung disease on PFTs.  He has been compliant with CPAP use during his stay.  He has had high levels of anxiety which have improved with ego support that has been provided by the team.  Dr. Sima Matas has worked with patient on coping and adjustment issues.  Weights have been monitored daily with strict I's and O's.  He did report worsening of respiratory status with upward trending weight on 10/23.  Chest x-ray showed evidence of pulmonary edema and he was treated with a dose of IV Lasix.  Hyponatremia has resolved and renal status is stable on serial checks.  Lasix was titrated upwards to 40 mg twice daily due to upward trending weight.  He has been advised to continue monitoring with daily as well as compliance with heart healthy diet after discharge.  His blood pressures have been monitored on TID basis and is within parameters for adequate perfusion. He is continent of bowel and bladder. Due to issues with insomnia trazodone was added and titrated up to 100 mg nightly. He has made gains during rehab stay and at supervision level. He will continue to receive follow up  HHPT and [place] by [facility] at home after discharge   Rehab course: During patient's stay in rehab weekly team conferences were held to monitor patient's progress, set goals and discuss barriers to discharge. At admission, patient required max assist with basic mobility and ADL tasks. He exhibited mild high-level cognitive deficits affecting memory semicomplex problem-solving and short-term memory with MoCA score of 25/30. He  has had improvement in activity tolerance, balance, postural control as well as ability to compensate for deficits. He/She has had improvement in functional use LUE  and LLE as well as improvement in awareness.  He is able to complete ADL tasks with supervision overall except for min assist for lower body dressing. He requires light min assist for bed mobility in order to maintain sternal precautions.  He is able to perform transfers with supervision and is ambulating 100 feet with rolling walker and verbal cues for safety. He is able to complete complex cognitive tasks at modified independent to supervision level with use of compensatory strategies.  Family education was completed regarding all aspects of safety and care.  Disposition: Home  Diet: Heart healthy  Special Instructions: 1.  Continue sternal precautions.  No driving or strenuous activity till cleared by MD 2.  Recommend repeat CBC in 1 to 2 weeks to monitor for stability 3.  Continue to monitor weights daily.  Discharge Instructions    Ambulatory referral to Neurology   Complete by: As directed    An appointment is requested in approximately: 2-3 weeks post stroke   Ambulatory referral to Physical Medicine Rehab   Complete by: As directed    1-2 weeks transitional care appt     Allergies as of 04/11/2019      Reactions   Penicillins Hives, Rash   Has patient had a PCN reaction causing immediate rash, facial/tongue/throat swelling, SOB or lightheadedness with hypotension: Unknown Has patient had a PCN  reaction causing severe rash involving mucus membranes or skin necrosis: Unknown Has patient had a PCN reaction that required hospitalization: No Has patient had a PCN reaction occurring within the last 10 years: No If all of the above answers are "NO", then may proceed with Cephalosporin use.      Medication List    STOP taking these medications   diclofenac 75 MG EC tablet Commonly known as: VOLTAREN   diltiazem 120 MG 24 hr capsule Commonly known as: CARDIZEM CD   Fish Oil 1200 MG Caps   lisinopril 40 MG tablet Commonly known as: ZESTRIL   magnesium oxide 400 MG tablet Commonly known as: MAG-OX   triamterene-hydrochlorothiazide 37.5-25 MG tablet Commonly known as: MAXZIDE-25     TAKE these medications   acetaminophen 325 MG tablet Commonly known as: TYLENOL Take 1-2 tablets (325-650 mg total) by mouth every 4 (four) hours as needed for mild pain.   allopurinol 300 MG tablet Commonly known as: ZYLOPRIM Take 300 mg by mouth every morning.   amiodarone 200 MG tablet Commonly known as: PACERONE Take 1 tablet (200 mg total) by mouth daily.   apixaban 5 MG Tabs tablet Commonly known as: Eliquis Take 1 tablet (5 mg total) by mouth 2 (two) times daily.   aspirin 81 MG EC tablet Take 1 tablet (81 mg total) by mouth daily.   atorvastatin 80 MG tablet Commonly known as: LIPITOR Take 1 tablet (80 mg total) by mouth daily. What changed: when to take this   citalopram 10 MG tablet Commonly known as: CELEXA Take 1 tablet (10 mg total) by mouth daily.   colchicine 0.6 MG tablet Take 1 tablet (0.6 mg total) by mouth daily at 6 PM.   diclofenac sodium 1 % Gel Commonly known as: VOLTAREN Apply 2 g topically 4 (four) times daily.   docusate sodium 100 MG capsule Commonly known as: COLACE Take 2 capsules (200 mg total) by mouth daily.   feeding supplement (PRO-STAT SUGAR FREE 64) Liqd Take 30 mLs by mouth 2 (two) times daily.   fluticasone 50 MCG/ACT nasal  spray Commonly known as: FLONASE Place 2 sprays into the nose daily.   folic acid 1 MG tablet Commonly known as: FOLVITE TAKE ONE TABLET BY MOUTH EVERY DAY What changed: when to take this   furosemide 20 MG tablet Commonly known as: LASIX Take 1 tablet (20 mg total) by mouth 2 (two) times daily.   guaiFENesin 600 MG 12 hr tablet Commonly known as: MUCINEX Take 1 tablet (600 mg total) by mouth 2 (two) times daily as needed for cough.   loratadine 10 MG tablet Commonly known as: CLARITIN Take 10 mg by mouth  every morning.   meclizine 32 MG tablet Commonly known as: ANTIVERT Take 32 mg by mouth daily as needed for dizziness.   metoprolol tartrate 25 MG tablet Commonly known as: LOPRESSOR Take 0.5 tablets (12.5 mg total) by mouth every morning.   mometasone-formoterol 200-5 MCG/ACT Aero Commonly known as: DULERA Inhale 2 puffs into the lungs 2 (two) times daily.   nitroGLYCERIN 0.4 MG SL tablet Commonly known as: NITROSTAT PLACE 1 TABLET (0.4 MG TOTAL) UNDER THE TONGUE EVERY 5 (FIVE) MINUTES AS NEEDED. What changed: reasons to take this   pantoprazole 40 MG tablet Commonly known as: PROTONIX Take 1 tablet (40 mg total) by mouth daily.   polyethylene glycol 17 g packet Commonly known as: MIRALAX / GLYCOLAX Take 17 g by mouth 3 (three) times a week.   potassium chloride SA 15 MEQ tablet Commonly known as: KLOR-CON M15 Take 2 tablets (30 mEq total) by mouth daily.   tamsulosin 0.4 MG Caps capsule Commonly known as: FLOMAX Take 1 capsule (0.4 mg total) by mouth 2 (two) times daily.   thiamine 100 MG tablet Take 1 tablet (100 mg total) by mouth daily.   traZODone 100 MG tablet Commonly known as: DESYREL Take 1 tablet (100 mg total) by mouth at bedtime as needed for sleep. Notes to patient: Stop watching TV/Movies after 10 pm. Try to read and do things to slow things down   Vitamin B-12 2500 MCG Subl Place 2,500 mcg under the tongue every morning.   Vitamin D-3  25 MCG (1000 UT) Caps Take 1,000 Units by mouth every morning.      Follow-up Information    Jamse Arn, MD Follow up.   Specialty: Physical Medicine and Rehabilitation Contact information: [address] [place] [address] [phone]        Ivin Poot, MD Follow up.   Specialty: Cardiothoracic Surgery Contact information: [place]  [address] [phone]        [name], MD. Call on 04/12/2019.   Specialty: Internal Medicine Why: for post hospital follow up Contact information: [address] Quintella Reichert [facility] [address] [phone]        [place] Follow up.   Why: Office will call you with follow up appointment Contact information: [address] [place], [place] [phone]          Signed: Bary Leriche 04/12/2019, 1:54 PM

## 2019-04-11 NOTE — Plan of Care (Signed)
  Problem: Consults Goal: RH STROKE PATIENT EDUCATION Description: See Patient Education module for education specifics  Outcome: Completed/Met   Problem: RH SKIN INTEGRITY Goal: RH STG SKIN FREE OF INFECTION/BREAKDOWN Outcome: Completed/Met Goal: RH STG MAINTAIN SKIN INTEGRITY WITH ASSISTANCE Description: STG Maintain Skin Integrity With min Assistance. Outcome: Completed/Met   Problem: RH PAIN MANAGEMENT Goal: RH STG PAIN MANAGED AT OR BELOW PT'S PAIN GOAL Description: Less than 3 out of 10 Outcome: Completed/Met   Problem: RH KNOWLEDGE DEFICIT Goal: RH STG INCREASE KNOWLEDGE OF HYPERTENSION Description: Patient able to state 2 methods of controlling high blood pressure Outcome: Completed/Met Goal: RH STG INCREASE KNOWLEGDE OF HYPERLIPIDEMIA Description: Pt will be able to demonstrate medication compliance and diet regimen to manage HLD Outcome: Completed/Met Goal: RH STG INCREASE KNOWLEDGE OF STROKE PROPHYLAXIS Description: Pt will be able to verbalize medication understanding and 3 signs of stroke to prevent stroke reoccuring Outcome: Completed/Met

## 2019-04-11 NOTE — Progress Notes (Signed)
[place] PHYSICAL MEDICINE & REHABILITATION PROGRESS NOTE   Subjective/Complaints:  Pt ready for d/c today- off O2.  ROS: Patient denies fever, rash, sore throat, blurred vision, nausea, vomiting, diarrhea, cough, chest pain, joint or back pain, headache, or mood change.    Objective:    No results found. Recent Labs    04/10/19 0454  WBC 4.8  HGB 10.7*  HCT 33.4*  PLT 177   Recent Labs    04/10/19 0454  NA 141  K 3.6  CL 107  CO2 25  GLUCOSE 104*  BUN 14  CREATININE 0.79  CALCIUM 8.8*    Intake/Output Summary (Last 24 hours) at 04/11/2019 0904 Last data filed at 04/11/2019 0802 Gross per 24 hour  Intake 877 ml  Output 1700 ml  Net -823 ml     Physical Exam: Vital Signs Blood pressure (!) 169/95, pulse 61, temperature 98.4 F (36.9 C), resp. rate 18, weight 118 kg, SpO2 98 %. Constitutional: No distress . Sitting up in manual w/c, ready for d/c- already packed Sitting up in manual w/c, done with eating, on 1L O2 by [place], NAD HEENT: EOMI, conjugate gaze Neck: supple Cardiovascular: IR- rate controlled  Respiratory: CTA Bilaterally. Normal effort   GI: BS +, non-tender, non-distended  Skin: Healing abrasions bilateral LE's Psych: slightly anxious/jittery, but stable. Musc: LLE edema 2-3+, L>R, but RLE still ~2+ (per pt no worse); TEDS in place Right ankle pain improved Neurological: Alert Motor: LUE 4+/5 proximal to distal.  LLE: 3+/5 HF, 4-/5KE and 0/5 ADF RUE: 5/5 proximal to distal, stable RLE: 4-4+/5 proximal to distal.   Assessment/Plan: 1. Functional deficits secondary to right ACA infarct which require 3+ hours per day of interdisciplinary therapy in a comprehensive inpatient rehab setting.  Physiatrist is providing close team supervision and 24 hour management of active medical problems listed below.  Physiatrist and rehab team continue to assess barriers to discharge/monitor patient progress toward functional and medical goals  Care  Tool:  Bathing    Body parts bathed by patient: Right arm, Left arm, Chest, Abdomen, Front perineal area, Right upper leg, Left upper leg, Face, Right lower leg, Left lower leg, Buttocks   Body parts bathed by helper: Buttocks     Bathing assist Assist Level: Supervision/Verbal cueing     Upper Body Dressing/Undressing Upper body dressing   What is the patient wearing?: Pull over shirt    Upper body assist Assist Level: Independent    Lower Body Dressing/Undressing Lower body dressing      What is the patient wearing?: Pants, Underwear/pull up     Lower body assist Assist for lower body dressing: Supervision/Verbal cueing     Toileting Toileting Toileting Activity did not occur (Clothing management and hygiene only): N/A (no void or bm)  Toileting assist Assist for toileting: Supervision/Verbal cueing     Transfers Chair/bed transfer  Transfers assist     Chair/bed transfer assist level: Supervision/Verbal cueing Chair/bed transfer assistive device: Programmer, multimedia   Ambulation assist   Ambulation activity did not occur: Safety/medical concerns(fatigue/SOB/R ankle pain)  Assist level: Supervision/Verbal cueing Assistive device: Walker-rolling Max distance: 100'   Walk 10 feet activity   Assist  Walk 10 feet activity did not occur: Safety/medical concerns(fatigue/SOB/R ankle pain)  Assist level: Supervision/Verbal cueing Assistive device: Walker-rolling   Walk 50 feet activity   Assist Walk 50 feet with 2 turns activity did not occur: Safety/medical concerns(R ankle pain and decreased activity tolerance)  Assist level: Supervision/Verbal  cueing Assistive device: Walker-rolling    Walk 150 feet activity   Assist Walk 150 feet activity did not occur: Safety/medical concerns(decreased activity tolerance)  Assist level: Contact Guard/Touching assist Assistive device: Walker-rolling    Walk 10 feet on uneven surface   activity   Assist Walk 10 feet on uneven surfaces activity did not occur: Safety/medical concerns(decreased balance/activity tolerance)         Wheelchair     Assist Will patient use wheelchair at discharge?: Yes Type of Wheelchair: Manual(transport chair) Wheelchair activity did not occur: Safety/medical concerns(sternal precautions)         Wheelchair 50 feet with 2 turns activity    Assist    Wheelchair 50 feet with 2 turns activity did not occur: Safety/medical concerns(sternal precautions)       Wheelchair 150 feet activity     Assist  Wheelchair 150 feet activity did not occur: Safety/medical concerns(sternal precautions)       Blood pressure (!) 169/95, pulse 61, temperature 98.4 F (36.9 C), resp. rate 18, weight 118 kg, SpO2 98 %.  Medical Problem List and Plan: 1.Left hemiparesis and decreased mobilitysecondary to right ACA infarct after CABG  Continue CIR PRAFO LLE 2. Antithrombotics: -DVT/anticoagulation:Pharmaceutical:Other (comment)--Eliquis -antiplatelet therapy: On low dose ASA. 3. Pain Management:Oxycodone prn 4. Mood:LCSW to follow for evaluation and support. -antipsychotic agents: N/A 5. Neuropsych: This patientiscapable of making decisions onhisown behalf. 6. Skin/Wound Care:Monitor wound for healing. Continue protein supplement to help promote healing. PRAFO LLE 7. Fluids/Electrolytes/Nutrition:encourage PO  10/26- labs still pending- will see by tomorrow  10/29- labs look great  11/2: labs reviewed and stable 8. CAD s/p CABG:Continue sternal precautions. ON ASA and Lipitor. 9. HTN:BP goal 110-140 to allow for adequate cerebral perfusion.  BP borderline 10/25---observe  10/28- will maintain BP due to goal for cerebral perfusion  10/31- BP 158/86- will not change meds to maintain cerebral perfusion.   11/1- better on its own- 140s/80s  11/2: will maintain  BP due to goal for cerebral perfusion 10 Leukocytosis:Resolved  11.Diastolic heart failure: Strict I/O and daily weights. On Lasix daily with low dose BB.  -cxr reviewed---bilateral effusions, no obvious edema Filed Weights   04/08/19 0556 04/09/19 0433 04/10/19 0534  Weight: 117.4 kg 120.8 kg 118 kg    Weights trending up until 10/23.  Pt diuresed quite a bit after IV lasix Friday night.   - 10/25 pt is -315 for last 24 hrs. Still looks a little fluid overloaded   -increase lasix to 20mg  bid  10/26- 4 kg higher in 2 days- will give another 40 mg Lasix x1 and follow closely.  10/27- down 2 kgs- will monitor and wait on more lasix  10/28- down another 1+ kg- maintain- will recheck labs in AM  10/31- Weight down to 117 kg  11/1- Weight up 3 kg- his swelling looks the same, however.Will monitor in AM for trend 12. Abnormal LFTs: WNL 10/20  13. Malnutrition: Pre-albumin- 14.1 14. A fib: Monitor for recurrent bradycardia--on Eliquis bid and back on amiodarone and metoprolol daily 15. Persistent hyponatremia:   Sodium 136 on 10/20  Na 138 on 10/29  141 on 11/2 16. OSA/Hypoxia: PFTs with moderate to severe obstructive airway disease--now on Dulera bid.   CPAP   Wean supplemental oxygen as tolerated, continues to require  10/28- off O2 by [place]             11/2: Continues to breathe well on RA 17. Urinary retention:DC'ed foley---> bladder training.  Ua negative, ucx no growth  Flomax increased on 10/21  Voiding with low pvr's now 18. Acute Blood Loss Anemia  Hb 10.3 on 10/20  Hb trending upward to 10.7 on 11/2.  19. Hypoalbuminemia  Supplement initiated on 10/21 20. Acute Gout Flare  Cont allopurinol   Colchicine started on 10/21  Improved--watch with diuresis  10/26- improved 21.  Morbid obesity: Encouraged weight loss 22. Borderline Hypokalemia  10/29- K+ 3.5- has been trending down last few lab draws- will give 1x KCl.   11/2: K+ 3.6 23. Insomnia- will increase Trazadone to  100 mg  10/31- better at higher dose  11/1- still can't sleep  11/2: slept better last night.  24. Dispo  10/31- pt wants flu shot- ordered  11/1- d/c Tuesday  11/3- make sure to have f/u in clinic    LOS: 15 days A FACE TO FACE EVALUATION WAS PERFORMED  Avagrace Botelho 04/11/2019, 9:04 AM

## 2019-04-11 NOTE — Progress Notes (Signed)
  Patient ID: Martin [name], male   DOB: 23-Nov-1955, 63 y.o.   MRN: [id number]   Diagnosis codes:  I69.354;  Z95.1  Height:  5'8"           Weight:   267 obs         Patient suffers from right CVA following a CABG which impairs their ability to perform daily activities like toileting, bathing, dressing and mobility in the home.  A walker will not resolve issue with performing activities of daily living.  A wheelchair will allow patient to safely perform daily activities.  Patient is not able to propel themselves in the home using a standard weight wheelchair due to significant weakness.  Patient can self propel in the lightweight wheelchair.  Reesa Chew, PA-C

## 2019-04-11 NOTE — Progress Notes (Signed)
Patient anticipates discharge home today, resting upon rounding, no acute distress noted

## 2019-04-11 NOTE — Discharge Instructions (Signed)
Inpatient Rehab Discharge Instructions  Martin [name] Discharge date and time:  04/11/19  Activities/Precautions/ Functional Status: Activity: no lifting, driving, or strenuous exercise till cleared by MD. Continue sternal precautions Diet: cardiac diet Wound Care: keep wound clean and dry Contact MD if you develop any problems with your incision/wound--redness, swelling, increase in pain, drainage or if you develop fever or chills.    Functional status:  ___ No restrictions     ___ Walk up steps independently _X_ 24/7 supervision/assistance   ___ Walk up steps with assistance ___ Intermittent supervision/assistance  ___ Bathe/dress independently ___ Walk with walker     ___ Bathe/dress with assistance ___ Walk Independently    ___ Shower independently ___ Walk with assistance    _X__ Shower with assistance _X__ No alcohol     ___ Return to work/school ________    COMMUNITY REFERRALS UPON DISCHARGE:    Home Health:   PT     OT                       Agency:  [facility]   Phone: [phone]    Special Instructions: 1. Need to use CPAP whenever napping or sleeping.   STROKE/TIA DISCHARGE INSTRUCTIONS SMOKING Cigarette smoking nearly doubles your risk of having a stroke & is the single most alterable risk factor  If you smoke or have smoked in the last 12 months, you are advised to quit smoking for your health.  Most of the excess cardiovascular risk related to smoking disappears within a year of stopping.  Ask you doctor about anti-smoking medications  [place] Quit Line: 1-800-QUIT NOW  Free Smoking Cessation Classes [phone]  CHOLESTEROL Know your levels; limit fat & cholesterol in your diet  Lipid Panel     Component Value Date/Time   CHOL 170 03/07/2019 2358   TRIG 218 (H) 03/07/2019 2358   HDL 41 03/07/2019 2358   CHOLHDL 4.1 03/07/2019 2358   VLDL 44 (H) 03/07/2019 2358   [place] 85 03/07/2019 2358      Many patients benefit from treatment even if  their cholesterol is at goal.  Goal: Total Cholesterol (CHOL) less than 160  Goal:  Triglycerides (TRIG) less than 150  Goal:  HDL greater than 40  Goal:  LDL (LDLCALC) less than 100   BLOOD PRESSURE American Stroke Association blood pressure target is less that 120/80 mm/Hg  Your discharge blood pressure is:  BP: 130/87  Monitor your blood pressure  Limit your salt and alcohol intake  Many individuals will require more than one medication for high blood pressure  DIABETES (A1c is a blood sugar average for last 3 months) Goal HGBA1c is under 7% (HBGA1c is blood sugar average for last 3 months)  Diabetes: No known diagnosis of diabetes    Lab Results  Component Value Date   HGBA1C 5.6 03/09/2019     Your HGBA1c can be lowered with medications, healthy diet, and exercise.  Check your blood sugar as directed by your physician  Call your physician if you experience unexplained or low blood sugars.  PHYSICAL ACTIVITY/REHABILITATION Goal is 30 minutes at least 4 days per week  Activity: No driving, Therapies: see above Return to work: N/a  Activity decreases your risk of heart attack and stroke and makes your heart stronger.  It helps control your weight and blood pressure; helps you relax and can improve your mood.  Participate in a regular exercise program.  Talk with your doctor  about the best form of exercise for you (dancing, walking, swimming, cycling).  DIET/WEIGHT Goal is to maintain a healthy weight  Your discharge diet is:  Diet Order            Diet heart healthy/carb modified Room service appropriate? Yes; Fluid consistency: Thin  Diet effective now             liquids Your height is:   Your current weight is: Weight: 118 kg Your Body Mass Index (BMI) is:  BMI (Calculated): 39.87  Following the type of diet specifically designed for you will help prevent another stroke.  Your goal weigh is:   Your goal Body Mass Index (BMI) is 19-24.  Healthy food  habits can help reduce 3 risk factors for stroke:  High cholesterol, hypertension, and excess weight.  RESOURCES Stroke/Support Group:  Call [phone]   STROKE EDUCATION PROVIDED/REVIEWED AND GIVEN TO PATIENT Stroke warning signs and symptoms How to activate emergency medical system (call 911). Medications prescribed at discharge. Need for follow-up after discharge. Personal risk factors for stroke. Pneumonia vaccine given:  Flu vaccine given:  My questions have been answered, the writing is legible, and I understand these instructions.  I will adhere to these goals & educational materials that have been provided to me after my discharge from the hospital.     My questions have been answered and I understand these instructions. I will adhere to these goals and the provided educational materials after my discharge from the hospital.  Patient/Caregiver Signature _______________________________ Date __________  Clinician Signature _______________________________________ Date __________  Please bring this form and your medication list with you to all your follow-up doctor's appointments. Information on my medicine - ELIQUIS (apixaban)  Why was Eliquis prescribed for you? Eliquis was prescribed for you to reduce the risk of forming blood clots that can cause a stroke if you have a medical condition called atrial fibrillation (a type of irregular heartbeat) OR to reduce the risk of a blood clots forming after orthopedic surgery.  What do You need to know about Eliquis ? Take your Eliquis TWICE DAILY - one tablet in the morning and one tablet in the evening with or without food.  It would be best to take the doses about the same time each day.  If you have difficulty swallowing the tablet whole please discuss with your pharmacist how to take the medication safely.  Take Eliquis exactly as prescribed by your doctor and DO NOT stop taking Eliquis without talking to the doctor who  prescribed the medication.  Stopping may increase your risk of developing a new clot or stroke.  Refill your prescription before you run out.  After discharge, you should have regular check-up appointments with your healthcare provider that is prescribing your Eliquis.  In the future your dose may need to be changed if your kidney function or weight changes by a significant amount or as you get older.  What do you do if you miss a dose? If you miss a dose, take it as soon as you remember on the same day and resume taking twice daily.  Do not take more than one dose of ELIQUIS at the same time.  Important Safety Information A possible side effect of Eliquis is bleeding. You should call your healthcare provider right away if you experience any of the following: ? Bleeding from an injury or your nose that does not stop. ? Unusual colored urine (red or dark brown) or unusual colored stools (red  or black). ? Unusual bruising for unknown reasons. ? A serious fall or if you hit your head (even if there is no bleeding).  Some medicines may interact with Eliquis and might increase your risk of bleeding or clotting while on Eliquis. To help avoid this, consult your healthcare provider or pharmacist prior to using any new prescription or non-prescription medications, including herbals, vitamins, non-steroidal anti-inflammatory drugs (NSAIDs) and supplements.  This website has more information on Eliquis (apixaban): [email or web address].

## 2019-04-12 DIAGNOSIS — I252 Old myocardial infarction: Secondary | ICD-10-CM | POA: Diagnosis not present

## 2019-04-12 DIAGNOSIS — Z951 Presence of aortocoronary bypass graft: Secondary | ICD-10-CM | POA: Diagnosis not present

## 2019-04-12 DIAGNOSIS — I2510 Atherosclerotic heart disease of native coronary artery without angina pectoris: Secondary | ICD-10-CM | POA: Diagnosis not present

## 2019-04-12 DIAGNOSIS — M1991 Primary osteoarthritis, unspecified site: Secondary | ICD-10-CM | POA: Diagnosis not present

## 2019-04-12 DIAGNOSIS — Z9181 History of falling: Secondary | ICD-10-CM | POA: Diagnosis not present

## 2019-04-12 DIAGNOSIS — Z7409 Other reduced mobility: Secondary | ICD-10-CM | POA: Diagnosis not present

## 2019-04-12 DIAGNOSIS — I63331 Cerebral infarction due to thrombosis of right posterior cerebral artery: Secondary | ICD-10-CM | POA: Diagnosis not present

## 2019-04-12 DIAGNOSIS — I110 Hypertensive heart disease with heart failure: Secondary | ICD-10-CM | POA: Diagnosis not present

## 2019-04-12 DIAGNOSIS — I5030 Unspecified diastolic (congestive) heart failure: Secondary | ICD-10-CM | POA: Diagnosis not present

## 2019-04-12 DIAGNOSIS — E46 Unspecified protein-calorie malnutrition: Secondary | ICD-10-CM | POA: Diagnosis not present

## 2019-04-12 DIAGNOSIS — G4700 Insomnia, unspecified: Secondary | ICD-10-CM | POA: Diagnosis not present

## 2019-04-12 DIAGNOSIS — Z7982 Long term (current) use of aspirin: Secondary | ICD-10-CM | POA: Diagnosis not present

## 2019-04-12 DIAGNOSIS — G4733 Obstructive sleep apnea (adult) (pediatric): Secondary | ICD-10-CM | POA: Diagnosis not present

## 2019-04-12 DIAGNOSIS — Z7901 Long term (current) use of anticoagulants: Secondary | ICD-10-CM | POA: Diagnosis not present

## 2019-04-12 DIAGNOSIS — M109 Gout, unspecified: Secondary | ICD-10-CM | POA: Diagnosis not present

## 2019-04-12 DIAGNOSIS — Z48812 Encounter for surgical aftercare following surgery on the circulatory system: Secondary | ICD-10-CM | POA: Diagnosis not present

## 2019-04-12 DIAGNOSIS — Z87891 Personal history of nicotine dependence: Secondary | ICD-10-CM | POA: Diagnosis not present

## 2019-04-12 DIAGNOSIS — E785 Hyperlipidemia, unspecified: Secondary | ICD-10-CM | POA: Diagnosis not present

## 2019-04-12 DIAGNOSIS — Z86718 Personal history of other venous thrombosis and embolism: Secondary | ICD-10-CM | POA: Diagnosis not present

## 2019-04-12 DIAGNOSIS — I69354 Hemiplegia and hemiparesis following cerebral infarction affecting left non-dominant side: Secondary | ICD-10-CM | POA: Diagnosis not present

## 2019-04-12 DIAGNOSIS — I4819 Other persistent atrial fibrillation: Secondary | ICD-10-CM | POA: Diagnosis not present

## 2019-04-12 DIAGNOSIS — R339 Retention of urine, unspecified: Secondary | ICD-10-CM | POA: Diagnosis not present

## 2019-04-12 NOTE — Progress Notes (Signed)
Social Work Discharge Note   The overall goal for the admission was met for:   Discharge location: Yes  Length of Stay: Yes  Discharge activity level: Yes  Home/community participation: Yes  Services provided included: MD, RD, PT, OT, SLP, RN, TR, Pharmacy, [place]: [facility]  Follow-up services arranged: Home Health: PT, OT via [facility] and DME: rolling walker via [employer];  20x18 wheelchair, cushion and 3n1 commode via [employer] (or additional information):     Contact info:   Pt @ [phone]  Patient/Family verbalized understanding of follow-up arrangements: Yes  Individual responsible for coordination of the follow-up plan: pt  Confirmed correct DME delivered: Khalilah Hoke 04/12/2019    Samael Blades

## 2019-04-13 ENCOUNTER — Telehealth: Payer: Self-pay

## 2019-04-13 DIAGNOSIS — R339 Retention of urine, unspecified: Secondary | ICD-10-CM | POA: Diagnosis not present

## 2019-04-13 DIAGNOSIS — Z9181 History of falling: Secondary | ICD-10-CM | POA: Diagnosis not present

## 2019-04-13 DIAGNOSIS — Z87891 Personal history of nicotine dependence: Secondary | ICD-10-CM | POA: Diagnosis not present

## 2019-04-13 DIAGNOSIS — I2510 Atherosclerotic heart disease of native coronary artery without angina pectoris: Secondary | ICD-10-CM | POA: Diagnosis not present

## 2019-04-13 DIAGNOSIS — I252 Old myocardial infarction: Secondary | ICD-10-CM | POA: Diagnosis not present

## 2019-04-13 DIAGNOSIS — I5030 Unspecified diastolic (congestive) heart failure: Secondary | ICD-10-CM | POA: Diagnosis not present

## 2019-04-13 DIAGNOSIS — Z7982 Long term (current) use of aspirin: Secondary | ICD-10-CM | POA: Diagnosis not present

## 2019-04-13 DIAGNOSIS — Z48812 Encounter for surgical aftercare following surgery on the circulatory system: Secondary | ICD-10-CM | POA: Diagnosis not present

## 2019-04-13 DIAGNOSIS — E46 Unspecified protein-calorie malnutrition: Secondary | ICD-10-CM | POA: Diagnosis not present

## 2019-04-13 DIAGNOSIS — E785 Hyperlipidemia, unspecified: Secondary | ICD-10-CM | POA: Diagnosis not present

## 2019-04-13 DIAGNOSIS — Z7901 Long term (current) use of anticoagulants: Secondary | ICD-10-CM | POA: Diagnosis not present

## 2019-04-13 DIAGNOSIS — I110 Hypertensive heart disease with heart failure: Secondary | ICD-10-CM | POA: Diagnosis not present

## 2019-04-13 DIAGNOSIS — M109 Gout, unspecified: Secondary | ICD-10-CM | POA: Diagnosis not present

## 2019-04-13 DIAGNOSIS — I69354 Hemiplegia and hemiparesis following cerebral infarction affecting left non-dominant side: Secondary | ICD-10-CM | POA: Diagnosis not present

## 2019-04-13 DIAGNOSIS — Z86718 Personal history of other venous thrombosis and embolism: Secondary | ICD-10-CM | POA: Diagnosis not present

## 2019-04-13 DIAGNOSIS — M1991 Primary osteoarthritis, unspecified site: Secondary | ICD-10-CM | POA: Diagnosis not present

## 2019-04-13 DIAGNOSIS — G4700 Insomnia, unspecified: Secondary | ICD-10-CM | POA: Diagnosis not present

## 2019-04-13 DIAGNOSIS — I4819 Other persistent atrial fibrillation: Secondary | ICD-10-CM | POA: Diagnosis not present

## 2019-04-13 DIAGNOSIS — Z951 Presence of aortocoronary bypass graft: Secondary | ICD-10-CM | POA: Diagnosis not present

## 2019-04-13 DIAGNOSIS — G4733 Obstructive sleep apnea (adult) (pediatric): Secondary | ICD-10-CM | POA: Diagnosis not present

## 2019-04-13 NOTE — Progress Notes (Signed)
NEUROLOGY CONSULTATION NOTE  TIMBERLAND ZEHRUNG MRN: [id number] DOB: 1955-11-03  Referring provider: Reesa Chew, PA-C Primary care provider: Allyn Kenner, MD  Reason for consult:  CVA  HISTORY OF PRESENT ILLNESS: Martin [name]. Martin [name] is [name] with atrial fibrillation, CAD, HTN, HLD, and OSA on CPAP who presents for recent cerebrovascular accident.  History supplemented by hospital notes.  CT/CT of head personally reviewed.  He was admitted to [facility] on 03/07/2019 for non-STEMI.  He underwent CABG on 03/10/2019.  He had episode of hypotension (SBP 70s-90s) on 03/12/2019.  On 03/13/2019, he was noted to have left arm and leg weakness.  CT of head showed right distal ACA territory infarct.  MRI unable to be performed due to recent CABG. REPEAT CT OF THE HEAD THE FOLLOWING DAY SHOWED STABLE SMALL RIGHT ACA INFARCT.  CTA of head and neck showed no emergent large  vessel occlusion but there was a new partially clipped calcified clot in the right ACA A1.  Carotid Doppler showed 1 to 39% stenosis in the bilateral internal carotid arteries.  TEE showed ejection fraction 55 to 60% with no cardiac source of embolus.  Transcranial Doppler showed no proximal intracranial large vessel stenosis.  LDL was 85.  Hemoglobin A1c was 5.6.  He was on Eliquis prior to admission.  As per stroke protocol, he was placed on IV heparin with instructions to transition back to Eliquis.  He was continued on Lipitor 80mg  daily.  During hospitalization, he had an acute episode of confusion.  EEG was normal.  He was discharged to inpatient rehab.  PAST MEDICAL HISTORY: Past Medical History:  Diagnosis Date  . Atrial fibrillation ([place])    a. dx 10/2017. b. s/p DCCV on 11/19/2017 with successful conversion to NSR but back in AFIB 3 weeks later  . CAD (coronary artery disease)    a. s/p PCI to LAD 2001. b. s/p stenting to the RCA and mid LAD December 2011, residual distal RCA disease treated  medically. b. nuc 02/2013 abnormal with fixed defect seen in inferoapical, mid-inferior, and basal inferior walls, indicative of myocardial scar, no evidence of ischemia, EF 41% (Ef 55-60% by echo same time).  . Carotid artery disease ([place])    carotid Doppler course left side 5069% stenosis  . Dysrhythmia    AFib  . Habitual alcohol use   . Hyperlipidemia   . Hypertension   . Low back pain   . OSA on CPAP   . Osteoarthritis   . Polysubstance abuse ([place])    use including marijuana and vicodin,which he obtained from the street  . PVC's (premature ventricular contractions)    status post Holter monitor normal sinus rhythm otherwise asymptomatic PVCs.  . Seasonal allergies     PAST SURGICAL HISTORY: Past Surgical History:  Procedure Laterality Date  . CARDIOVERSION N/A 11/19/2017   Procedure: CARDIOVERSION;  Surgeon: Herminio Commons, MD;  [facility];  Service: Cardiovascular;  Laterality: N/A;  . CORONARY ANGIOPLASTY WITH STENT PLACEMENT  2011  . CORONARY ARTERY BYPASS GRAFT N/A 03/10/2019   Procedure: CORONARY ARTERY BYPASS GRAFTING (CABG)X 3 Using Left Internal Mammary Artery and Right Saphenous Vein Grafts;  Surgeon: Ivin Poot, MD;  Location: [facility];  Service: Open Heart Surgery;  Laterality: N/A;  . LEFT HEART CATH AND CORONARY ANGIOGRAPHY N/A 03/08/2019   Procedure: LEFT HEART CATH AND CORONARY ANGIOGRAPHY;  Surgeon: Belva Crome, MD;  Location: [facility];  Service: Cardiovascular;  Laterality: N/A;  . TEE WITHOUT CARDIOVERSION N/A 03/10/2019   Procedure: TRANSESOPHAGEAL ECHOCARDIOGRAM (TEE);  Surgeon: Prescott Gum, Collier Salina, MD;  Location: [facility];  Service: Open Heart Surgery;  Laterality: N/A;  . TONSILLECTOMY      MEDICATIONS: Current Outpatient Medications on File Prior to Visit  Medication Sig Dispense Refill  . acetaminophen (TYLENOL) 325 MG tablet Take 1-2 tablets (325-650 mg total) by mouth every 4 (four) hours as needed for mild pain.    Marland Kitchen  allopurinol (ZYLOPRIM) 300 MG tablet Take 300 mg by mouth every morning.     . Amino Acids-Protein Hydrolys (FEEDING SUPPLEMENT, PRO-STAT SUGAR FREE 64,) LIQD Take 30 mLs by mouth 2 (two) times daily. 887 mL 0  . amiodarone (PACERONE) 200 MG tablet Take 1 tablet (200 mg total) by mouth daily. 30 tablet 0  . apixaban (ELIQUIS) 5 MG TABS tablet Take 1 tablet (5 mg total) by mouth 2 (two) times daily. 180 tablet 1  . aspirin EC 81 MG EC tablet Take 1 tablet (81 mg total) by mouth daily. 100 tablet 1  . atorvastatin (LIPITOR) 80 MG tablet Take 1 tablet (80 mg total) by mouth daily. (Patient taking differently: Take 80 mg by mouth every evening. ) 90 tablet 2  . Cholecalciferol (VITAMIN D-3) 1000 UNITS CAPS Take 1,000 Units by mouth every morning.     . citalopram (CELEXA) 10 MG tablet Take 1 tablet (10 mg total) by mouth daily. 30 tablet 0  . colchicine 0.6 MG tablet Take 1 tablet (0.6 mg total) by mouth daily at 6 PM. 30 tablet 0  . Cyanocobalamin (VITAMIN B-12) 2500 MCG SUBL Place 2,500 mcg under the tongue every morning.     . diclofenac sodium (VOLTAREN) 1 % GEL Apply 2 g topically 4 (four) times daily. 350 g 1  . docusate sodium (COLACE) 100 MG capsule Take 2 capsules (200 mg total) by mouth daily. 60 capsule 0  . fluticasone (FLONASE) 50 MCG/ACT nasal spray Place 2 sprays into the nose daily.      . folic acid (FOLVITE) 1 MG tablet TAKE ONE TABLET BY MOUTH EVERY DAY (Patient taking differently: Take 1 mg by mouth every morning. ) 30 tablet 6  . furosemide (LASIX) 20 MG tablet Take 1 tablet (20 mg total) by mouth 2 (two) times daily. 60 tablet 0  . guaiFENesin (MUCINEX) 600 MG 12 hr tablet Take 1 tablet (600 mg total) by mouth 2 (two) times daily as needed for cough. 24 tablet 0  . loratadine (CLARITIN) 10 MG tablet Take 10 mg by mouth every morning.     . meclizine (ANTIVERT) 32 MG tablet Take 32 mg by mouth daily as needed for dizziness.     . metoprolol tartrate (LOPRESSOR) 25 MG tablet Take  0.5 tablets (12.5 mg total) by mouth every morning. 30 tablet 0  . mometasone-formoterol (DULERA) 200-5 MCG/ACT AERO Inhale 2 puffs into the lungs 2 (two) times daily. 13 g 0  . nitroGLYCERIN (NITROSTAT) 0.4 MG SL tablet PLACE 1 TABLET (0.4 MG TOTAL) UNDER THE TONGUE EVERY 5 (FIVE) MINUTES AS NEEDED. (Patient taking differently: Place 0.4 mg under the tongue every 5 (five) minutes as needed for chest pain. ) 25 tablet 3  . pantoprazole (PROTONIX) 40 MG tablet Take 1 tablet (40 mg total) by mouth daily. 30 tablet 0  . polyethylene glycol (MIRALAX / GLYCOLAX) packet Take 17 g by mouth 3 (three) times a week.     . potassium chloride (  KLOR-CON M15) 15 MEQ tablet Take 2 tablets (30 mEq total) by mouth daily. 60 tablet 0  . tamsulosin (FLOMAX) 0.4 MG CAPS capsule Take 1 capsule (0.4 mg total) by mouth 2 (two) times daily. 60 capsule 0  . thiamine 100 MG tablet Take 1 tablet (100 mg total) by mouth daily. 30 tablet 0  . traZODone (DESYREL) 100 MG tablet Take 1 tablet (100 mg total) by mouth at bedtime as needed for sleep. 30 tablet 0   No current facility-administered medications on file prior to visit.     ALLERGIES: Allergies  Allergen Reactions  . Penicillins Hives and Rash    Has patient had a PCN reaction causing immediate rash, facial/tongue/throat swelling, SOB or lightheadedness with hypotension: Unknown Has patient had a PCN reaction causing severe rash involving mucus membranes or skin necrosis: Unknown Has patient had a PCN reaction that required hospitalization: No Has patient had a PCN reaction occurring within the last 10 years: No If all of the above answers are "NO", then may proceed with Cephalosporin use.      FAMILY HISTORY: Family History  Problem Relation Age of Onset  . Stroke Mother   . Other Mother        small bowel obstruction  . Heart attack Father   . Heart attack Brother   .  SOCIAL HISTORY: Social History   Socioeconomic History  . Marital status:  Married    Spouse name: Not on file  . Number of children: Not on file  . Years of education: Not on file  . Highest education level: Not on file  Occupational History    Employer: UNEMPLOYED    Comment: works Social worker employed  Social Needs  . Financial resource strain: Not on file  . Food insecurity    Worry: Not on file    Inability: Not on file  . Transportation needs    Medical: Not on file    Non-medical: Not on file  Tobacco Use  . Smoking status: Former Smoker    Packs/day: 1.00    Years: 35.00    Pack years: 35.00    Types: Cigarettes    Start date: 09/18/1967    Quit date: 06/09/1999    Years since quitting: 19.8  . Smokeless tobacco: Never Used  Substance and Sexual Activity  . Alcohol use: Yes    Alcohol/week: 42.0 standard drinks    Types: 42 Shots of liquor per week    Comment: 1/2 gallon og liquor per week  . Drug use: Yes    Types: Marijuana    Comment: smokes marijuana occassionally-last smoked few weeks ago  . Sexual activity: Yes    Birth control/protection: None  Lifestyle  . Physical activity    Days per week: Not on file    Minutes per session: Not on file  . Stress: Not on file  Relationships  . Social Herbalist on phone: Not on file    Gets together: Not on file    Attends religious service: Not on file    Active member of club or organization: Not on file    Attends meetings of clubs or organizations: Not on file    Relationship status: Not on file  . Intimate partner violence    Fear of current or ex partner: Not on file    Emotionally abused: Not on file    Physically abused: Not on file    Forced sexual activity: Not on file  Other  Topics Concern  . Not on file  Social History Narrative   No exercise    REVIEW OF SYSTEMS: Constitutional: No fevers, chills, or sweats, no generalized fatigue, change in appetite Eyes: No visual changes, double vision, eye pain Ear, nose and throat: No hearing loss, ear pain,  nasal congestion, sore throat Cardiovascular: No chest pain, palpitations Respiratory:  No shortness of breath at rest or with exertion, wheezes GastrointestinaI: No nausea, vomiting, diarrhea, abdominal pain, fecal incontinence Genitourinary:  No dysuria, urinary retention or frequency Musculoskeletal:  No neck pain, back pain Integumentary: No rash, pruritus, skin lesions Neurological: as above Psychiatric: No depression, insomnia, anxiety Endocrine: No palpitations, fatigue, diaphoresis, mood swings, change in appetite, change in weight, increased thirst Hematologic/Lymphatic:  No purpura, petechiae. Allergic/Immunologic: no itchy/runny eyes, nasal congestion, recent allergic reactions, rashes  PHYSICAL EXAM: Blood pressure (!) 141/91, pulse 77, height 5\' 8"  (1.727 m), weight 258 lb 3.2 oz (117.1 kg), SpO2 98 %. General: No acute distress.  Patient appears well-groomed.  Head:  Normocephalic/atraumatic Eyes:  fundi examined but not visualized Neck: supple, no paraspinal tenderness, full range of motion Back: No paraspinal tenderness Heart: regular rate and rhythm Lungs: Clear to auscultation bilaterally. Vascular: No carotid bruits. Neurological Exam: Mental status: alert and oriented to person, place, and time, recent and remote memory intact, fund of knowledge intact, attention and concentration intact, speech fluent and not dysarthric, language intact. Cranial nerves: CN I: not tested CN II: pupils equal, round and reactive to light, visual fields intact CN III, IV, VI:  full range of motion, no nystagmus, no ptosis CN V: facial sensation intact CN VII: upper and lower face symmetric CN VIII: hearing intact CN IX, X: gag intact, uvula midline CN XI: sternocleidomastoid and trapezius muscles intact CN XII: tongue midline Bulk & Tone: normal, no fasciculations. Motor:  5-/5 left upper and lower extremities. Sensation:  temperature and vibration sensation reduced in feet    Deep Tendon Reflexes:  2+ throughout,  toes downgoing.   Finger to nose testing:  Without dysmetria.   Heel to shin:  Without dysmetria.   Gait:  Unsteady on feet.  Uses walker.  Romberg with sway.  IMPRESSION: 1.  Right posterior ACA territory infarct, cryptogenic.  Unclear if may be secondary to afib in setting of holding anticoagulation for CABG. 2.  Afib 3.  Hypertension 4.  Hyperlipidemia 5.  OSA on CPAP  PLAN: 1.  Continue Eliquis for secondary stroke prevention 2.  Continue Lipitor 80mg  daily (LDL goal less than 70) 3.  Optimized blood pressure control 4.  Continue use of nightly CPAP 5.  Mediterranean diet 6.  Follow up in   Thank you for allowing me to take part in the care of this patient.  Metta Clines, DO  CC:  Allyn Kenner, MD

## 2019-04-13 NOTE — Telephone Encounter (Signed)
  [place]  Patient Name: Santiago Hinegardner DOB: [birth year] Appointment Date and Time: 04-24-2019 / 11:40am With: Dr. Dagoberto Ligas  Questions for our staff to ask patients on Transitional care 48 hour phone call:    1. Are you/is patient experiencing any problems since coming home? NO     Are there any questions regarding any aspect of care? NO  2. Are there any questions regarding medications administration/dosing?  NO     Are meds being taken as prescribed? YES  3. Have there been any falls? NO  4. Has Home Health been to the house and/or have they contacted you? YES     If not, have you tried to contact them? NA     Can we help you contact them? NA  5. Are bowels and bladder emptying properly? YES     Are there any unexpected incontinence issues? NA     If applicable, is patient following bowel/bladder programs? NA  6. Any fevers, problems with breathing, unexpected pain? NO  7. Are there any skin problems or new areas of breakdown? NO  8. Has the patient/family member arranged specialty MD follow up (ie cardiology/neurology/renal/surgical/etc)? YES     Can we help arrange? NO  9. Does the patient need any other services or support that we can help arrange? NO  10. Are caregivers following through as expected in assisting the patient? YES  11. Has the patient quit smoking, drinking alcohol, or using drugs as recommended? NA  [facility] [address] [phone]

## 2019-04-14 ENCOUNTER — Ambulatory Visit: Payer: Medicare Other | Admitting: Neurology

## 2019-04-14 ENCOUNTER — Encounter: Payer: Self-pay | Admitting: Neurology

## 2019-04-14 ENCOUNTER — Other Ambulatory Visit: Payer: Self-pay

## 2019-04-14 VITALS — BP 141/91 | HR 77 | Ht 68.0 in | Wt 258.2 lb

## 2019-04-14 DIAGNOSIS — I4819 Other persistent atrial fibrillation: Secondary | ICD-10-CM | POA: Diagnosis not present

## 2019-04-14 DIAGNOSIS — I10 Essential (primary) hypertension: Secondary | ICD-10-CM

## 2019-04-14 DIAGNOSIS — G4733 Obstructive sleep apnea (adult) (pediatric): Secondary | ICD-10-CM

## 2019-04-14 DIAGNOSIS — E785 Hyperlipidemia, unspecified: Secondary | ICD-10-CM

## 2019-04-14 DIAGNOSIS — I63421 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: Secondary | ICD-10-CM | POA: Diagnosis not present

## 2019-04-14 NOTE — Patient Instructions (Addendum)
1.  Continue Eliquis 2.  Continue Lipitor 80mg  daily 3.  Maintain blood pressure control 4.  Follow Mediterranean diet (see below) 5.  Continue use of CPAP nightly 6.  Follow up in 4 months.   Mediterranean Diet A Mediterranean diet refers to food and lifestyle choices that are based on the traditions of countries located on the [employer]. This way of eating has been shown to help prevent certain conditions and improve outcomes for people who have chronic diseases, like kidney disease and heart disease. What are tips for following this plan? Lifestyle  Cook and eat meals together with your family, when possible.  Drink enough fluid to keep your urine clear or pale yellow.  Be physically active every day. This includes: ? Aerobic exercise like running or swimming. ? Leisure activities like gardening, walking, or housework.  Get 7-8 hours of sleep each night.  If recommended by your health care provider, drink red wine in moderation. This means 1 glass a day for nonpregnant women and 2 glasses a day for men. A glass of wine equals 5 oz (150 mL). Reading food labels   Check the serving size of packaged foods. For foods such as rice and pasta, the serving size refers to the amount of cooked product, not dry.  Check the total fat in packaged foods. Avoid foods that have saturated fat or trans fats.  Check the ingredients list for added sugars, such as corn syrup. Shopping  At the grocery store, buy most of your food from the areas near the walls of the store. This includes: ? Fresh fruits and vegetables (produce). ? Grains, beans, nuts, and seeds. Some of these may be available in unpackaged forms or large amounts (in bulk). ? Fresh seafood. ? Poultry and eggs. ? Low-fat dairy products.  Buy whole ingredients instead of prepackaged foods.  Buy fresh fruits and vegetables in-season from local farmers markets.  Buy frozen fruits and vegetables in resealable bags.  If  you do not have access to quality fresh seafood, buy precooked frozen shrimp or canned fish, such as tuna, salmon, or sardines.  Buy small amounts of raw or cooked vegetables, salads, or olives from the deli or salad bar at your store.  Stock your pantry so you always have certain foods on hand, such as olive oil, canned tuna, canned tomatoes, rice, pasta, and beans. Cooking  Cook foods with extra-virgin olive oil instead of using butter or other vegetable oils.  Have meat as a side dish, and have vegetables or grains as your main dish. This means having meat in small portions or adding small amounts of meat to foods like pasta or stew.  Use beans or vegetables instead of meat in common dishes like chili or lasagna.  Experiment with different cooking methods. Try roasting or broiling vegetables instead of steaming or sauteing them.  Add frozen vegetables to soups, stews, pasta, or rice.  Add nuts or seeds for added healthy fat at each meal. You can add these to yogurt, salads, or vegetable dishes.  Marinate fish or vegetables using olive oil, lemon juice, garlic, and fresh herbs. Meal planning   Plan to eat 1 vegetarian meal one day each week. Try to work up to 2 vegetarian meals, if possible.  Eat seafood 2 or more times a week.  Have healthy snacks readily available, such as: ? Vegetable sticks with hummus. ? [place] yogurt. ? Fruit and nut trail mix.  Eat balanced meals throughout the week. This includes: ?  Fruit: 2-3 servings a day ? Vegetables: 4-5 servings a day ? Low-fat dairy: 2 servings a day ? Fish, poultry, or lean meat: 1 serving a day ? Beans and legumes: 2 or more servings a week ? Nuts and seeds: 1-2 servings a day ? Whole grains: 6-8 servings a day ? Extra-virgin olive oil: 3-4 servings a day  Limit red meat and sweets to only a few servings a month What are my food choices?  Mediterranean diet ? Recommended  Grains: Whole-grain pasta. Brown rice. Bulgar  wheat. Polenta. Couscous. Whole-wheat bread. [place].  Vegetables: Artichokes. Beets. Broccoli. Cabbage. Carrots. Eggplant. Green beans. Chard. Kale. Spinach. Onions. Leeks. Peas. Squash. Tomatoes. Peppers. Radishes.  Fruits: Apples. Apricots. Avocado. Berries. Bananas. Cherries. Dates. Figs. Grapes. Lemons. Melon. Oranges. Peaches. Plums. Pomegranate.  Meats and other protein foods: Beans. Almonds. Sunflower seeds. Pine nuts. Peanuts. [place]. Salmon. Scallops. Shrimp. [place]. Tilapia. Clams. Oysters. Eggs.  Dairy: Low-fat milk. Cheese. Greek yogurt.  Beverages: Water. Red wine. Herbal tea.  Fats and oils: Extra virgin olive oil. Avocado oil. Grape seed oil.  Sweets and desserts: [place] yogurt with honey. Baked apples. Poached pears. Trail mix.  Seasoning and other foods: Basil. Cilantro. Coriander. Cumin. Mint. Parsley. Sage. Rosemary. Tarragon. Garlic. Oregano. Thyme. Pepper. Balsalmic vinegar. Tahini. Hummus. Tomato sauce. Olives. Mushrooms. ? Limit these  Grains: Prepackaged pasta or rice dishes. Prepackaged cereal with added sugar.  Vegetables: Deep fried potatoes (french fries).  Fruits: Fruit canned in syrup.  Meats and other protein foods: Beef. Pork. Lamb. Poultry with skin. Hot dogs. Berniece Salines.  Dairy: Ice cream. Sour cream. Whole milk.  Beverages: Juice. Sugar-sweetened soft drinks. Beer. Liquor and spirits.  Fats and oils: Butter. Canola oil. Vegetable oil. Beef fat (tallow). Lard.  Sweets and desserts: Cookies. Cakes. Pies. Candy.  Seasoning and other foods: Mayonnaise. Premade sauces and marinades. The items listed may not be a complete list. Talk with your dietitian about what dietary choices are right for you. Summary  The Mediterranean diet includes both food and lifestyle choices.  Eat a variety of fresh fruits and vegetables, beans, nuts, seeds, and whole grains.  Limit the amount of red meat and sweets that you eat.  Talk with your health care provider  about whether it is safe for you to drink red wine in moderation. This means 1 glass a day for nonpregnant women and 2 glasses a day for men. A glass of wine equals 5 oz (150 mL). This information is not intended to replace advice given to you by your health care provider. Make sure you discuss any questions you have with your health care provider. Document Released: 01/16/2016 Document Revised: 01/23/2016 Document Reviewed: 01/16/2016 Elsevier Patient Education  2020 [employer].

## 2019-04-17 DIAGNOSIS — M1990 Unspecified osteoarthritis, unspecified site: Secondary | ICD-10-CM | POA: Diagnosis not present

## 2019-04-17 DIAGNOSIS — Z0000 Encounter for general adult medical examination without abnormal findings: Secondary | ICD-10-CM | POA: Diagnosis not present

## 2019-04-17 DIAGNOSIS — I4891 Unspecified atrial fibrillation: Secondary | ICD-10-CM | POA: Diagnosis not present

## 2019-04-17 DIAGNOSIS — I69354 Hemiplegia and hemiparesis following cerebral infarction affecting left non-dominant side: Secondary | ICD-10-CM | POA: Diagnosis not present

## 2019-04-17 DIAGNOSIS — I10 Essential (primary) hypertension: Secondary | ICD-10-CM | POA: Diagnosis not present

## 2019-04-18 DIAGNOSIS — I2510 Atherosclerotic heart disease of native coronary artery without angina pectoris: Secondary | ICD-10-CM | POA: Diagnosis not present

## 2019-04-18 DIAGNOSIS — I252 Old myocardial infarction: Secondary | ICD-10-CM | POA: Diagnosis not present

## 2019-04-18 DIAGNOSIS — Z951 Presence of aortocoronary bypass graft: Secondary | ICD-10-CM | POA: Diagnosis not present

## 2019-04-18 DIAGNOSIS — Z87891 Personal history of nicotine dependence: Secondary | ICD-10-CM | POA: Diagnosis not present

## 2019-04-18 DIAGNOSIS — I4819 Other persistent atrial fibrillation: Secondary | ICD-10-CM | POA: Diagnosis not present

## 2019-04-18 DIAGNOSIS — Z7901 Long term (current) use of anticoagulants: Secondary | ICD-10-CM | POA: Diagnosis not present

## 2019-04-18 DIAGNOSIS — R339 Retention of urine, unspecified: Secondary | ICD-10-CM | POA: Diagnosis not present

## 2019-04-18 DIAGNOSIS — I69354 Hemiplegia and hemiparesis following cerebral infarction affecting left non-dominant side: Secondary | ICD-10-CM | POA: Diagnosis not present

## 2019-04-18 DIAGNOSIS — G4733 Obstructive sleep apnea (adult) (pediatric): Secondary | ICD-10-CM | POA: Diagnosis not present

## 2019-04-18 DIAGNOSIS — G4700 Insomnia, unspecified: Secondary | ICD-10-CM | POA: Diagnosis not present

## 2019-04-18 DIAGNOSIS — E46 Unspecified protein-calorie malnutrition: Secondary | ICD-10-CM | POA: Diagnosis not present

## 2019-04-18 DIAGNOSIS — Z9181 History of falling: Secondary | ICD-10-CM | POA: Diagnosis not present

## 2019-04-18 DIAGNOSIS — Z7982 Long term (current) use of aspirin: Secondary | ICD-10-CM | POA: Diagnosis not present

## 2019-04-18 DIAGNOSIS — Z86718 Personal history of other venous thrombosis and embolism: Secondary | ICD-10-CM | POA: Diagnosis not present

## 2019-04-18 DIAGNOSIS — I5030 Unspecified diastolic (congestive) heart failure: Secondary | ICD-10-CM | POA: Diagnosis not present

## 2019-04-18 DIAGNOSIS — I110 Hypertensive heart disease with heart failure: Secondary | ICD-10-CM | POA: Diagnosis not present

## 2019-04-18 DIAGNOSIS — Z48812 Encounter for surgical aftercare following surgery on the circulatory system: Secondary | ICD-10-CM | POA: Diagnosis not present

## 2019-04-18 DIAGNOSIS — M109 Gout, unspecified: Secondary | ICD-10-CM | POA: Diagnosis not present

## 2019-04-18 DIAGNOSIS — M1991 Primary osteoarthritis, unspecified site: Secondary | ICD-10-CM | POA: Diagnosis not present

## 2019-04-18 DIAGNOSIS — E785 Hyperlipidemia, unspecified: Secondary | ICD-10-CM | POA: Diagnosis not present

## 2019-04-20 DIAGNOSIS — G4700 Insomnia, unspecified: Secondary | ICD-10-CM | POA: Diagnosis not present

## 2019-04-20 DIAGNOSIS — M1991 Primary osteoarthritis, unspecified site: Secondary | ICD-10-CM | POA: Diagnosis not present

## 2019-04-20 DIAGNOSIS — I4819 Other persistent atrial fibrillation: Secondary | ICD-10-CM | POA: Diagnosis not present

## 2019-04-20 DIAGNOSIS — Z951 Presence of aortocoronary bypass graft: Secondary | ICD-10-CM | POA: Diagnosis not present

## 2019-04-20 DIAGNOSIS — M109 Gout, unspecified: Secondary | ICD-10-CM | POA: Diagnosis not present

## 2019-04-20 DIAGNOSIS — Z86718 Personal history of other venous thrombosis and embolism: Secondary | ICD-10-CM | POA: Diagnosis not present

## 2019-04-20 DIAGNOSIS — Z7982 Long term (current) use of aspirin: Secondary | ICD-10-CM | POA: Diagnosis not present

## 2019-04-20 DIAGNOSIS — I2510 Atherosclerotic heart disease of native coronary artery without angina pectoris: Secondary | ICD-10-CM | POA: Diagnosis not present

## 2019-04-20 DIAGNOSIS — I69354 Hemiplegia and hemiparesis following cerebral infarction affecting left non-dominant side: Secondary | ICD-10-CM | POA: Diagnosis not present

## 2019-04-20 DIAGNOSIS — R339 Retention of urine, unspecified: Secondary | ICD-10-CM | POA: Diagnosis not present

## 2019-04-20 DIAGNOSIS — E785 Hyperlipidemia, unspecified: Secondary | ICD-10-CM | POA: Diagnosis not present

## 2019-04-20 DIAGNOSIS — Z48812 Encounter for surgical aftercare following surgery on the circulatory system: Secondary | ICD-10-CM | POA: Diagnosis not present

## 2019-04-20 DIAGNOSIS — Z9181 History of falling: Secondary | ICD-10-CM | POA: Diagnosis not present

## 2019-04-20 DIAGNOSIS — I252 Old myocardial infarction: Secondary | ICD-10-CM | POA: Diagnosis not present

## 2019-04-20 DIAGNOSIS — Z87891 Personal history of nicotine dependence: Secondary | ICD-10-CM | POA: Diagnosis not present

## 2019-04-20 DIAGNOSIS — G4733 Obstructive sleep apnea (adult) (pediatric): Secondary | ICD-10-CM | POA: Diagnosis not present

## 2019-04-20 DIAGNOSIS — Z7901 Long term (current) use of anticoagulants: Secondary | ICD-10-CM | POA: Diagnosis not present

## 2019-04-20 DIAGNOSIS — I110 Hypertensive heart disease with heart failure: Secondary | ICD-10-CM | POA: Diagnosis not present

## 2019-04-20 DIAGNOSIS — I5030 Unspecified diastolic (congestive) heart failure: Secondary | ICD-10-CM | POA: Diagnosis not present

## 2019-04-20 DIAGNOSIS — E46 Unspecified protein-calorie malnutrition: Secondary | ICD-10-CM | POA: Diagnosis not present

## 2019-04-24 ENCOUNTER — Encounter: Payer: Self-pay | Admitting: Physical Medicine and Rehabilitation

## 2019-04-24 ENCOUNTER — Other Ambulatory Visit: Payer: Self-pay

## 2019-04-24 ENCOUNTER — Encounter
Payer: Medicare Other | Attending: Physical Medicine and Rehabilitation | Admitting: Physical Medicine and Rehabilitation

## 2019-04-24 ENCOUNTER — Encounter: Payer: Medicare Other | Admitting: Physical Medicine and Rehabilitation

## 2019-04-24 VITALS — BP 146/85 | HR 73 | Ht 68.0 in | Wt 258.0 lb

## 2019-04-24 DIAGNOSIS — Z86718 Personal history of other venous thrombosis and embolism: Secondary | ICD-10-CM | POA: Diagnosis not present

## 2019-04-24 DIAGNOSIS — Z87891 Personal history of nicotine dependence: Secondary | ICD-10-CM | POA: Diagnosis not present

## 2019-04-24 DIAGNOSIS — Z951 Presence of aortocoronary bypass graft: Secondary | ICD-10-CM | POA: Diagnosis not present

## 2019-04-24 DIAGNOSIS — M109 Gout, unspecified: Secondary | ICD-10-CM | POA: Diagnosis not present

## 2019-04-24 DIAGNOSIS — E46 Unspecified protein-calorie malnutrition: Secondary | ICD-10-CM | POA: Diagnosis not present

## 2019-04-24 DIAGNOSIS — I63521 Cerebral infarction due to unspecified occlusion or stenosis of right anterior cerebral artery: Secondary | ICD-10-CM | POA: Insufficient documentation

## 2019-04-24 DIAGNOSIS — G4700 Insomnia, unspecified: Secondary | ICD-10-CM | POA: Diagnosis not present

## 2019-04-24 DIAGNOSIS — I69354 Hemiplegia and hemiparesis following cerebral infarction affecting left non-dominant side: Secondary | ICD-10-CM | POA: Diagnosis not present

## 2019-04-24 DIAGNOSIS — I4819 Other persistent atrial fibrillation: Secondary | ICD-10-CM | POA: Diagnosis not present

## 2019-04-24 DIAGNOSIS — G4733 Obstructive sleep apnea (adult) (pediatric): Secondary | ICD-10-CM | POA: Diagnosis not present

## 2019-04-24 DIAGNOSIS — E785 Hyperlipidemia, unspecified: Secondary | ICD-10-CM | POA: Diagnosis not present

## 2019-04-24 DIAGNOSIS — Z9181 History of falling: Secondary | ICD-10-CM | POA: Diagnosis not present

## 2019-04-24 DIAGNOSIS — Z48812 Encounter for surgical aftercare following surgery on the circulatory system: Secondary | ICD-10-CM | POA: Diagnosis not present

## 2019-04-24 DIAGNOSIS — I110 Hypertensive heart disease with heart failure: Secondary | ICD-10-CM | POA: Diagnosis not present

## 2019-04-24 DIAGNOSIS — I2510 Atherosclerotic heart disease of native coronary artery without angina pectoris: Secondary | ICD-10-CM | POA: Diagnosis not present

## 2019-04-24 DIAGNOSIS — R339 Retention of urine, unspecified: Secondary | ICD-10-CM | POA: Diagnosis not present

## 2019-04-24 DIAGNOSIS — I252 Old myocardial infarction: Secondary | ICD-10-CM | POA: Diagnosis not present

## 2019-04-24 DIAGNOSIS — I5030 Unspecified diastolic (congestive) heart failure: Secondary | ICD-10-CM | POA: Diagnosis not present

## 2019-04-24 DIAGNOSIS — Z7982 Long term (current) use of aspirin: Secondary | ICD-10-CM | POA: Diagnosis not present

## 2019-04-24 DIAGNOSIS — Z7901 Long term (current) use of anticoagulants: Secondary | ICD-10-CM | POA: Diagnosis not present

## 2019-04-24 DIAGNOSIS — M1991 Primary osteoarthritis, unspecified site: Secondary | ICD-10-CM | POA: Diagnosis not present

## 2019-04-24 NOTE — Patient Instructions (Signed)
Follow-up in 4 weeks with Dr. Ranell Patrick via in-person or web-ex for evaluation and prescription of outpatient therapy

## 2019-04-24 NOTE — Progress Notes (Signed)
Subjective:    Patient ID: Martin [name], male    DOB: 06-29-1955, 63 y.o.   MRN: [id number]  HPI Martin [name] presents for hospital follow-up following right ACA stroke.   He has no complaints this visit. Denies constipation. He is sleeping well at night. He does not require medication renewal at this time. He had outpatient follow-up with neurology on 11/6 and received stroke prevention medication/instruction. He is currently getting home PT. He does have some low back pain that is worst in the morning, 4/10. He has been ambulating with PT. 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 It is laborious but he feels confident that he knows how to adjust back to work.   Pain Inventory Average Pain 4 Pain Right Now 4 My pain is aching  In the last 24 hours, has pain interfered with the following? General activity 3 Relation with others 3 Enjoyment of life 3 What TIME of day is your pain at its worst? morning, evening Sleep (in general) Fair  Pain is worse with: inactivity and some activites Pain improves with: medication Relief from Meds: 3  Mobility walk with assistance use a walker ability to climb steps?  no do you drive?  no use a wheelchair  Function employed # of hrs/week 40 I need assistance with the following:  dressing, bathing, toileting, meal prep, household duties and shopping Do you have any goals in this area?  yes  Neuro/Psych trouble walking anxiety  Prior Studies transitional care  Physicians involved in your care transitional care   Family History  Problem Relation Age of Onset  . Stroke Mother   . Other Mother        small bowel obstruction  . Heart attack Father   . Heart attack Brother   . Healthy Son    Social History   Socioeconomic History  . Marital status: Married    Spouse name: Not on file  . Number of children: 1  . Years of education: Not on file  . Highest education level: 9th  grade  Occupational History    Employer: UNEMPLOYED    Comment: works Social worker employed  Social Needs  . Financial resource strain: Not on file  . Food insecurity    Worry: Not on file    Inability: Not on file  . Transportation needs    Medical: Not on file    Non-medical: Not on file  Tobacco Use  . Smoking status: Former Smoker    Packs/day: 1.00    Years: 35.00    Pack years: 35.00    Types: Cigarettes    Start date: 09/18/1967    Quit date: 06/09/1999    Years since quitting: 19.8  . Smokeless tobacco: Never Used  Substance and Sexual Activity  . Alcohol use: Yes    Alcohol/week: 42.0 standard drinks    Types: 42 Shots of liquor per week    Comment: 1/2 gallon og liquor per week  . Drug use: Yes    Types: Marijuana    Comment: smokes marijuana occassionally-last smoked few weeks ago  . Sexual activity: Yes    Birth control/protection: None  Lifestyle  . Physical activity    Days per week: Not on file    Minutes per session: Not on file  . Stress: Not on file  Relationships  . Social Herbalist on phone: Not on file    Gets together: Not  on file    Attends religious service: Not on file    Active member of club or organization: Not on file    Attends meetings of clubs or organizations: Not on file    Relationship status: Not on file  Other Topics Concern  . Not on file  Social History Narrative   No exercise   Pt lives with spouse in 1 story home   Right handed   Drinks coffee every morning, not tea, no soda   One son high level of education is 9th grade   Past Surgical History:  Procedure Laterality Date  . CARDIOVERSION N/A 11/19/2017   Procedure: CARDIOVERSION;  Surgeon: Herminio Commons, MD;  [facility];  Service: Cardiovascular;  Laterality: N/A;  . CORONARY ANGIOPLASTY WITH STENT PLACEMENT  2011  . CORONARY ARTERY BYPASS GRAFT N/A 03/10/2019   Procedure: CORONARY ARTERY BYPASS GRAFTING (CABG)X 3 Using Left Internal  Mammary Artery and Right Saphenous Vein Grafts;  Surgeon: Ivin Poot, MD;  Location: [place];  Service: Open Heart Surgery;  Laterality: N/A;  . LEFT HEART CATH AND CORONARY ANGIOGRAPHY N/A 03/08/2019   Procedure: LEFT HEART CATH AND CORONARY ANGIOGRAPHY;  Surgeon: Belva Crome, MD;  Location: [place] [facility];  Service: Cardiovascular;  Laterality: N/A;  . TEE WITHOUT CARDIOVERSION N/A 03/10/2019   Procedure: TRANSESOPHAGEAL ECHOCARDIOGRAM (TEE);  Surgeon: Prescott Gum, Collier Salina, MD;  Location: [facility];  Service: Open Heart Surgery;  Laterality: N/A;  . TONSILLECTOMY     Past Medical History:  Diagnosis Date  . Atrial fibrillation ([place])    a. dx 10/2017. b. s/p DCCV on 11/19/2017 with successful conversion to NSR but back in AFIB 3 weeks later  . CAD (coronary artery disease)    a. s/p PCI to LAD 2001. b. s/p stenting to the RCA and mid LAD December 2011, residual distal RCA disease treated medically. b. nuc 02/2013 abnormal with fixed defect seen in inferoapical, mid-inferior, and basal inferior walls, indicative of myocardial scar, no evidence of ischemia, EF 41% (Ef 55-60% by echo same time).  . Carotid artery disease ([place])    carotid Doppler course left side 5069% stenosis  . Dysrhythmia    AFib  . Habitual alcohol use   . Hyperlipidemia   . Hypertension   . Low back pain   . OSA on CPAP   . Osteoarthritis   . Polysubstance abuse ([place])    use including marijuana and vicodin,which he obtained from the street  . PVC's (premature ventricular contractions)    status post Holter monitor normal sinus rhythm otherwise asymptomatic PVCs.  . Seasonal allergies    BP (!) 146/85   Pulse 73   Ht 5\' 8"  (1.727 m)   Wt 258 lb (117 kg)   SpO2 93%   BMI 39.23 kg/m   Opioid Risk Score:   Fall Risk Score:  `1  Depression screen PHQ 2/9  Depression screen PHQ 2/9 04/24/2019  Decreased Interest 0  Down, Depressed, Hopeless 0  PHQ - 2 Score 0  Altered sleeping 1  Tired, decreased energy 1   Change in appetite 0  Feeling bad or failure about yourself  0  Trouble concentrating 0  Moving slowly or fidgety/restless 0  Suicidal thoughts 0  PHQ-9 Score 2  Difficult doing work/chores Not difficult at all    Review of Systems  Constitutional: Negative.   HENT: Negative.   Eyes: Negative.   Respiratory: Negative.   Cardiovascular: Negative.   Gastrointestinal:  Negative.   Endocrine: Negative.   Genitourinary: Negative.   Musculoskeletal: Positive for gait problem.  Skin: Negative.   Allergic/Immunologic: Negative.   Hematological: Negative.   Psychiatric/Behavioral: The patient is nervous/anxious.        Objective:   Physical Exam  Gen: no distress, normal appearing HEENT: oral mucosa pink and moist, NCAT Cardio: Reg rate Chest: normal effort, normal rate of breathing Abd: soft, non-distended Ext: no edema Skin: intact Neuro/Musculoskeletal: AOx3. 5/5 strength throughout. Sensation intact. 2+ reflexes throughout. No dysmetria. Psych: pleasant, normal affect      Assessment & Plan:  Mr. Kable presents for hospital follow-up following right ACA stroke.   --Pain well controlled, no constipation, sleeping well, progressing with therapy, does not require medication renewals this visit. Followed up with neurology on 11/6 and received stroke education and medications.  --Continue Home PT with eventual transition to outpatient PT. --Follow-up in 4 weeks via in-person or web-ex for evaluation and prescription of outpatient therapy --Future goal will be to return to work and functional activities and to create diet and exercise plan.

## 2019-04-25 DIAGNOSIS — I69354 Hemiplegia and hemiparesis following cerebral infarction affecting left non-dominant side: Secondary | ICD-10-CM | POA: Diagnosis not present

## 2019-04-25 DIAGNOSIS — I2510 Atherosclerotic heart disease of native coronary artery without angina pectoris: Secondary | ICD-10-CM | POA: Diagnosis not present

## 2019-04-25 DIAGNOSIS — Z9181 History of falling: Secondary | ICD-10-CM | POA: Diagnosis not present

## 2019-04-25 DIAGNOSIS — M1991 Primary osteoarthritis, unspecified site: Secondary | ICD-10-CM | POA: Diagnosis not present

## 2019-04-25 DIAGNOSIS — G4733 Obstructive sleep apnea (adult) (pediatric): Secondary | ICD-10-CM | POA: Diagnosis not present

## 2019-04-25 DIAGNOSIS — E46 Unspecified protein-calorie malnutrition: Secondary | ICD-10-CM | POA: Diagnosis not present

## 2019-04-25 DIAGNOSIS — Z48812 Encounter for surgical aftercare following surgery on the circulatory system: Secondary | ICD-10-CM | POA: Diagnosis not present

## 2019-04-25 DIAGNOSIS — I4819 Other persistent atrial fibrillation: Secondary | ICD-10-CM | POA: Diagnosis not present

## 2019-04-25 DIAGNOSIS — M109 Gout, unspecified: Secondary | ICD-10-CM | POA: Diagnosis not present

## 2019-04-25 DIAGNOSIS — I252 Old myocardial infarction: Secondary | ICD-10-CM | POA: Diagnosis not present

## 2019-04-25 DIAGNOSIS — G4700 Insomnia, unspecified: Secondary | ICD-10-CM | POA: Diagnosis not present

## 2019-04-25 DIAGNOSIS — I110 Hypertensive heart disease with heart failure: Secondary | ICD-10-CM | POA: Diagnosis not present

## 2019-04-25 DIAGNOSIS — E785 Hyperlipidemia, unspecified: Secondary | ICD-10-CM | POA: Diagnosis not present

## 2019-04-25 DIAGNOSIS — Z87891 Personal history of nicotine dependence: Secondary | ICD-10-CM | POA: Diagnosis not present

## 2019-04-25 DIAGNOSIS — Z951 Presence of aortocoronary bypass graft: Secondary | ICD-10-CM | POA: Diagnosis not present

## 2019-04-25 DIAGNOSIS — Z7901 Long term (current) use of anticoagulants: Secondary | ICD-10-CM | POA: Diagnosis not present

## 2019-04-25 DIAGNOSIS — I5030 Unspecified diastolic (congestive) heart failure: Secondary | ICD-10-CM | POA: Diagnosis not present

## 2019-04-25 DIAGNOSIS — Z7982 Long term (current) use of aspirin: Secondary | ICD-10-CM | POA: Diagnosis not present

## 2019-04-25 DIAGNOSIS — R339 Retention of urine, unspecified: Secondary | ICD-10-CM | POA: Diagnosis not present

## 2019-04-25 DIAGNOSIS — Z86718 Personal history of other venous thrombosis and embolism: Secondary | ICD-10-CM | POA: Diagnosis not present

## 2019-04-26 ENCOUNTER — Other Ambulatory Visit: Payer: Self-pay | Admitting: Cardiothoracic Surgery

## 2019-04-26 ENCOUNTER — Ambulatory Visit (INDEPENDENT_AMBULATORY_CARE_PROVIDER_SITE_OTHER): Payer: Self-pay | Admitting: Cardiothoracic Surgery

## 2019-04-26 ENCOUNTER — Ambulatory Visit
Admission: RE | Admit: 2019-04-26 | Discharge: 2019-04-26 | Disposition: A | Discharge status: Home / Self Care | Payer: Medicare Other | Source: Ambulatory Visit | Attending: Cardiothoracic Surgery | Admitting: Cardiothoracic Surgery

## 2019-04-26 ENCOUNTER — Encounter: Payer: Self-pay | Admitting: Cardiothoracic Surgery

## 2019-04-26 ENCOUNTER — Other Ambulatory Visit: Payer: Self-pay

## 2019-04-26 DIAGNOSIS — R0602 Shortness of breath: Secondary | ICD-10-CM | POA: Diagnosis not present

## 2019-04-26 DIAGNOSIS — R29898 Other symptoms and signs involving the musculoskeletal system: Secondary | ICD-10-CM | POA: Insufficient documentation

## 2019-04-26 DIAGNOSIS — Z951 Presence of aortocoronary bypass graft: Secondary | ICD-10-CM

## 2019-04-26 NOTE — Progress Notes (Signed)
PCP is [name], MD Referring Provider is [name], MD  Chief Complaint  Patient presents with  . Routine Post Op    s/p CABG 03/10/19    HPI: 1 month follow-up after CABG x3. Patient had previous PCI with severe recurrent CAD. He had preoperative intermittent atrial fibrillation. His LV function was normal pre and postop.  He did develop a postop CVA, probable embolic related to his A. Fib. He had a left lower leg weakness but is able to walk now with a rolling walker and receives physical therapy and occupational therapy each twice a week at home.  His atrial fibrillation is rate controlled and he is on Eliquis.  He has no symptoms of angina or CHF and the surgical incisions are healing well.  Chest x-ray today shows improvement in his aeration and lung volumes with minimal pleural effusion.  Past Medical History:  Diagnosis Date  . Atrial fibrillation ([place])    a. dx 10/2017. b. s/p DCCV on 11/19/2017 with successful conversion to NSR but back in AFIB 3 weeks later  . CAD (coronary artery disease)    a. s/p PCI to LAD 2001. b. s/p stenting to the RCA and mid LAD December 2011, residual distal RCA disease treated medically. b. nuc 02/2013 abnormal with fixed defect seen in inferoapical, mid-inferior, and basal inferior walls, indicative of myocardial scar, no evidence of ischemia, EF 41% (Ef 55-60% by echo same time).  . Carotid artery disease ([place])    carotid Doppler course left side 5069% stenosis  . Dysrhythmia    AFib  . Habitual alcohol use   . Hyperlipidemia   . Hypertension   . Low back pain   . OSA on CPAP   . Osteoarthritis   . Polysubstance abuse ([place])    use including marijuana and vicodin,which he obtained from the street  . PVC's (premature ventricular contractions)    status post Holter monitor normal sinus rhythm otherwise asymptomatic PVCs.  . Seasonal allergies     Past Surgical History:  Procedure Laterality Date  . CARDIOVERSION N/A 11/19/2017   Procedure: CARDIOVERSION;  Surgeon: Herminio Commons, MD;  [facility];  Service: Cardiovascular;  Laterality: N/A;  . CORONARY ANGIOPLASTY WITH STENT PLACEMENT  2011  . CORONARY ARTERY BYPASS GRAFT N/A 03/10/2019   Procedure: CORONARY ARTERY BYPASS GRAFTING (CABG)X 3 Using Left Internal Mammary Artery and Right Saphenous Vein Grafts;  Surgeon: Ivin Poot, MD;  Location: [facility];  Service: Open Heart Surgery;  Laterality: N/A;  . LEFT HEART CATH AND CORONARY ANGIOGRAPHY N/A 03/08/2019   Procedure: LEFT HEART CATH AND CORONARY ANGIOGRAPHY;  Surgeon: Belva Crome, MD;  Location: [facility];  Service: Cardiovascular;  Laterality: N/A;  . TEE WITHOUT CARDIOVERSION N/A 03/10/2019   Procedure: TRANSESOPHAGEAL ECHOCARDIOGRAM (TEE);  Surgeon: Prescott Gum, Collier Salina, MD;  Location: [place];  Service: Open Heart Surgery;  Laterality: N/A;  . TONSILLECTOMY      Family History  Problem Relation Age of Onset  . Stroke Mother   . Other Mother        small bowel obstruction  . Heart attack Father   . Heart attack Brother   . Healthy Son     Social History Social History   Tobacco Use  . Smoking status: Former Smoker    Packs/day: 1.00    Years: 35.00    Pack years: 35.00    Types: Cigarettes    Start date: 09/18/1967  Quit date: 06/09/1999    Years since quitting: 19.8  . Smokeless tobacco: Never Used  Substance Use Topics  . Alcohol use: Yes    Alcohol/week: 42.0 standard drinks    Types: 42 Shots of liquor per week    Comment: 1/2 gallon og liquor per week  . Drug use: Yes    Types: Marijuana    Comment: smokes marijuana occassionally-last smoked few weeks ago    Current Outpatient Medications  Medication Sig Dispense Refill  . acetaminophen (TYLENOL) 325 MG tablet Take 1-2 tablets (325-650 mg total) by mouth every 4 (four) hours as needed for mild pain.    Marland Kitchen allopurinol (ZYLOPRIM) 300 MG tablet Take 300 mg by mouth every morning.     . Amino Acids-Protein  Hydrolys (FEEDING SUPPLEMENT, PRO-STAT SUGAR FREE 64,) LIQD Take 30 mLs by mouth 2 (two) times daily. 887 mL 0  . amiodarone (PACERONE) 200 MG tablet Take 1 tablet (200 mg total) by mouth daily. 30 tablet 0  . apixaban (ELIQUIS) 5 MG TABS tablet Take 1 tablet (5 mg total) by mouth 2 (two) times daily. 180 tablet 1  . aspirin EC 81 MG EC tablet Take 1 tablet (81 mg total) by mouth daily. 100 tablet 1  . atorvastatin (LIPITOR) 80 MG tablet Take 1 tablet (80 mg total) by mouth daily. (Patient taking differently: Take 80 mg by mouth every evening. ) 90 tablet 2  . Cholecalciferol (VITAMIN D-3) 1000 UNITS CAPS Take 1,000 Units by mouth every morning.     . citalopram (CELEXA) 10 MG tablet Take 1 tablet (10 mg total) by mouth daily. 30 tablet 0  . colchicine 0.6 MG tablet Take 1 tablet (0.6 mg total) by mouth daily at 6 PM. 30 tablet 0  . Cyanocobalamin (VITAMIN B-12) 2500 MCG SUBL Place 2,500 mcg under the tongue every morning.     . diclofenac sodium (VOLTAREN) 1 % GEL Apply 2 g topically 4 (four) times daily. 350 g 1  . docusate sodium (COLACE) 100 MG capsule Take 2 capsules (200 mg total) by mouth daily. 60 capsule 0  . fluticasone (FLONASE) 50 MCG/ACT nasal spray Place 2 sprays into the nose daily.      . folic acid (FOLVITE) 1 MG tablet TAKE ONE TABLET BY MOUTH EVERY DAY (Patient taking differently: Take 1 mg by mouth every morning. ) 30 tablet 6  . furosemide (LASIX) 20 MG tablet Take 1 tablet (20 mg total) by mouth 2 (two) times daily. 60 tablet 0  . guaiFENesin (MUCINEX) 600 MG 12 hr tablet Take 1 tablet (600 mg total) by mouth 2 (two) times daily as needed for cough. 24 tablet 0  . loratadine (CLARITIN) 10 MG tablet Take 10 mg by mouth every morning.     . meclizine (ANTIVERT) 32 MG tablet Take 32 mg by mouth daily as needed for dizziness.     . metoprolol tartrate (LOPRESSOR) 25 MG tablet Take 0.5 tablets (12.5 mg total) by mouth every morning. 30 tablet 0  . mometasone-formoterol (DULERA)  200-5 MCG/ACT AERO Inhale 2 puffs into the lungs 2 (two) times daily. 13 g 0  . nitroGLYCERIN (NITROSTAT) 0.4 MG SL tablet PLACE 1 TABLET (0.4 MG TOTAL) UNDER THE TONGUE EVERY 5 (FIVE) MINUTES AS NEEDED. (Patient taking differently: Place 0.4 mg under the tongue every 5 (five) minutes as needed for chest pain. ) 25 tablet 3  . pantoprazole (PROTONIX) 40 MG tablet Take 1 tablet (40 mg total) by mouth daily. 30 tablet  0  . polyethylene glycol (MIRALAX / GLYCOLAX) packet Take 17 g by mouth 3 (three) times a week.     . potassium chloride (KLOR-CON M15) 15 MEQ tablet Take 2 tablets (30 mEq total) by mouth daily. 60 tablet 0  . tamsulosin (FLOMAX) 0.4 MG CAPS capsule Take 1 capsule (0.4 mg total) by mouth 2 (two) times daily. 60 capsule 0  . thiamine 100 MG tablet Take 1 tablet (100 mg total) by mouth daily. 30 tablet 0  . traZODone (DESYREL) 100 MG tablet Take 1 tablet (100 mg total) by mouth at bedtime as needed for sleep. 30 tablet 0   No current facility-administered medications for this visit.     Allergies  Allergen Reactions  . Penicillins Hives and Rash    Has patient had a PCN reaction causing immediate rash, facial/tongue/throat swelling, SOB or lightheadedness with hypotension: Unknown Has patient had a PCN reaction causing severe rash involving mucus membranes or skin necrosis: Unknown Has patient had a PCN reaction that required hospitalization: No Has patient had a PCN reaction occurring within the last 10 years: No If all of the above answers are "NO", then may proceed with Cephalosporin use.      Review of Systems  He has lost less than 10 pounds in weight Appetite is improving Insomnia is still a mild issue No fever No productive cough No diarrhea No recurrent gout in his right ankle or foot Some presyncope when he tries to pull himself into bed bears down with a Valsalva  BP (!) 150/88 (BP Location: Left Arm)   Pulse 74   Temp (!) 97.5 F (36.4 C) (Skin)   Resp 20    Ht 5\' 8"  (1.727 m)   Wt 255 lb (115.7 kg)   SpO2 93% Comment: RA  BMI 38.77 kg/m  Physical Exam      Exam    General- alert and comfortable    Neck- no JVD, no cervical adenopathy palpable, no carotid bruit   Lungs- clear without rales, wheezes   Cor- regular rate and rhythm, no murmur , gallop   Abdomen- soft, non-tender   Extremities - warm, non-tender, minimal edema   Neuro- oriented, appropriate, no focal weakness   Diagnostic Tests: Chest x-ray performed today shows improved lung volumes no significant pleural effusion sternal wires intact  Impression: Patient making steady progress after multivessel CABG with postoperative CVA with left lower extremity weakness now improving with physical therapy and time.  Plan: Continue current medications.  Return in 1 month with chest x-ray to assess progress.  Not ready to drive resume work or lift more than 5 pounds.  Importance of walking every day is emphasized to the patient and his son.   Len Childs, MD [facility] [phone]

## 2019-04-27 DIAGNOSIS — M1991 Primary osteoarthritis, unspecified site: Secondary | ICD-10-CM | POA: Diagnosis not present

## 2019-04-27 DIAGNOSIS — Z9181 History of falling: Secondary | ICD-10-CM | POA: Diagnosis not present

## 2019-04-27 DIAGNOSIS — E785 Hyperlipidemia, unspecified: Secondary | ICD-10-CM | POA: Diagnosis not present

## 2019-04-27 DIAGNOSIS — E46 Unspecified protein-calorie malnutrition: Secondary | ICD-10-CM | POA: Diagnosis not present

## 2019-04-27 DIAGNOSIS — I2510 Atherosclerotic heart disease of native coronary artery without angina pectoris: Secondary | ICD-10-CM | POA: Diagnosis not present

## 2019-04-27 DIAGNOSIS — I69354 Hemiplegia and hemiparesis following cerebral infarction affecting left non-dominant side: Secondary | ICD-10-CM | POA: Diagnosis not present

## 2019-04-27 DIAGNOSIS — G4733 Obstructive sleep apnea (adult) (pediatric): Secondary | ICD-10-CM | POA: Diagnosis not present

## 2019-04-27 DIAGNOSIS — Z86718 Personal history of other venous thrombosis and embolism: Secondary | ICD-10-CM | POA: Diagnosis not present

## 2019-04-27 DIAGNOSIS — I252 Old myocardial infarction: Secondary | ICD-10-CM | POA: Diagnosis not present

## 2019-04-27 DIAGNOSIS — I4819 Other persistent atrial fibrillation: Secondary | ICD-10-CM | POA: Diagnosis not present

## 2019-04-27 DIAGNOSIS — M109 Gout, unspecified: Secondary | ICD-10-CM | POA: Diagnosis not present

## 2019-04-27 DIAGNOSIS — Z7901 Long term (current) use of anticoagulants: Secondary | ICD-10-CM | POA: Diagnosis not present

## 2019-04-27 DIAGNOSIS — Z7982 Long term (current) use of aspirin: Secondary | ICD-10-CM | POA: Diagnosis not present

## 2019-04-27 DIAGNOSIS — Z951 Presence of aortocoronary bypass graft: Secondary | ICD-10-CM | POA: Diagnosis not present

## 2019-04-27 DIAGNOSIS — Z48812 Encounter for surgical aftercare following surgery on the circulatory system: Secondary | ICD-10-CM | POA: Diagnosis not present

## 2019-04-27 DIAGNOSIS — G4700 Insomnia, unspecified: Secondary | ICD-10-CM | POA: Diagnosis not present

## 2019-04-27 DIAGNOSIS — Z87891 Personal history of nicotine dependence: Secondary | ICD-10-CM | POA: Diagnosis not present

## 2019-04-27 DIAGNOSIS — I110 Hypertensive heart disease with heart failure: Secondary | ICD-10-CM | POA: Diagnosis not present

## 2019-04-27 DIAGNOSIS — R339 Retention of urine, unspecified: Secondary | ICD-10-CM | POA: Diagnosis not present

## 2019-04-27 DIAGNOSIS — I5030 Unspecified diastolic (congestive) heart failure: Secondary | ICD-10-CM | POA: Diagnosis not present

## 2019-04-28 DIAGNOSIS — Z7982 Long term (current) use of aspirin: Secondary | ICD-10-CM | POA: Diagnosis not present

## 2019-04-28 DIAGNOSIS — I2510 Atherosclerotic heart disease of native coronary artery without angina pectoris: Secondary | ICD-10-CM | POA: Diagnosis not present

## 2019-04-28 DIAGNOSIS — I252 Old myocardial infarction: Secondary | ICD-10-CM | POA: Diagnosis not present

## 2019-04-28 DIAGNOSIS — G4733 Obstructive sleep apnea (adult) (pediatric): Secondary | ICD-10-CM | POA: Diagnosis not present

## 2019-04-28 DIAGNOSIS — I4819 Other persistent atrial fibrillation: Secondary | ICD-10-CM | POA: Diagnosis not present

## 2019-04-28 DIAGNOSIS — I5030 Unspecified diastolic (congestive) heart failure: Secondary | ICD-10-CM | POA: Diagnosis not present

## 2019-04-28 DIAGNOSIS — Z7901 Long term (current) use of anticoagulants: Secondary | ICD-10-CM | POA: Diagnosis not present

## 2019-04-28 DIAGNOSIS — I110 Hypertensive heart disease with heart failure: Secondary | ICD-10-CM | POA: Diagnosis not present

## 2019-04-28 DIAGNOSIS — Z87891 Personal history of nicotine dependence: Secondary | ICD-10-CM | POA: Diagnosis not present

## 2019-04-28 DIAGNOSIS — M1991 Primary osteoarthritis, unspecified site: Secondary | ICD-10-CM | POA: Diagnosis not present

## 2019-04-28 DIAGNOSIS — I69354 Hemiplegia and hemiparesis following cerebral infarction affecting left non-dominant side: Secondary | ICD-10-CM | POA: Diagnosis not present

## 2019-04-28 DIAGNOSIS — E785 Hyperlipidemia, unspecified: Secondary | ICD-10-CM | POA: Diagnosis not present

## 2019-04-28 DIAGNOSIS — Z9181 History of falling: Secondary | ICD-10-CM | POA: Diagnosis not present

## 2019-04-28 DIAGNOSIS — Z48812 Encounter for surgical aftercare following surgery on the circulatory system: Secondary | ICD-10-CM | POA: Diagnosis not present

## 2019-04-28 DIAGNOSIS — M109 Gout, unspecified: Secondary | ICD-10-CM | POA: Diagnosis not present

## 2019-04-28 DIAGNOSIS — Z951 Presence of aortocoronary bypass graft: Secondary | ICD-10-CM | POA: Diagnosis not present

## 2019-04-28 DIAGNOSIS — G4700 Insomnia, unspecified: Secondary | ICD-10-CM | POA: Diagnosis not present

## 2019-04-28 DIAGNOSIS — Z86718 Personal history of other venous thrombosis and embolism: Secondary | ICD-10-CM | POA: Diagnosis not present

## 2019-04-28 DIAGNOSIS — R339 Retention of urine, unspecified: Secondary | ICD-10-CM | POA: Diagnosis not present

## 2019-04-28 DIAGNOSIS — E46 Unspecified protein-calorie malnutrition: Secondary | ICD-10-CM | POA: Diagnosis not present

## 2019-05-01 DIAGNOSIS — I2510 Atherosclerotic heart disease of native coronary artery without angina pectoris: Secondary | ICD-10-CM | POA: Diagnosis not present

## 2019-05-01 DIAGNOSIS — E46 Unspecified protein-calorie malnutrition: Secondary | ICD-10-CM | POA: Diagnosis not present

## 2019-05-01 DIAGNOSIS — Z7901 Long term (current) use of anticoagulants: Secondary | ICD-10-CM | POA: Diagnosis not present

## 2019-05-01 DIAGNOSIS — I4819 Other persistent atrial fibrillation: Secondary | ICD-10-CM | POA: Diagnosis not present

## 2019-05-01 DIAGNOSIS — Z951 Presence of aortocoronary bypass graft: Secondary | ICD-10-CM | POA: Diagnosis not present

## 2019-05-01 DIAGNOSIS — E785 Hyperlipidemia, unspecified: Secondary | ICD-10-CM | POA: Diagnosis not present

## 2019-05-01 DIAGNOSIS — M109 Gout, unspecified: Secondary | ICD-10-CM | POA: Diagnosis not present

## 2019-05-01 DIAGNOSIS — Z86718 Personal history of other venous thrombosis and embolism: Secondary | ICD-10-CM | POA: Diagnosis not present

## 2019-05-01 DIAGNOSIS — I5030 Unspecified diastolic (congestive) heart failure: Secondary | ICD-10-CM | POA: Diagnosis not present

## 2019-05-01 DIAGNOSIS — M1991 Primary osteoarthritis, unspecified site: Secondary | ICD-10-CM | POA: Diagnosis not present

## 2019-05-01 DIAGNOSIS — Z48812 Encounter for surgical aftercare following surgery on the circulatory system: Secondary | ICD-10-CM | POA: Diagnosis not present

## 2019-05-01 DIAGNOSIS — G4700 Insomnia, unspecified: Secondary | ICD-10-CM | POA: Diagnosis not present

## 2019-05-01 DIAGNOSIS — I69354 Hemiplegia and hemiparesis following cerebral infarction affecting left non-dominant side: Secondary | ICD-10-CM | POA: Diagnosis not present

## 2019-05-01 DIAGNOSIS — G4733 Obstructive sleep apnea (adult) (pediatric): Secondary | ICD-10-CM | POA: Diagnosis not present

## 2019-05-01 DIAGNOSIS — R339 Retention of urine, unspecified: Secondary | ICD-10-CM | POA: Diagnosis not present

## 2019-05-01 DIAGNOSIS — Z9181 History of falling: Secondary | ICD-10-CM | POA: Diagnosis not present

## 2019-05-01 DIAGNOSIS — Z7982 Long term (current) use of aspirin: Secondary | ICD-10-CM | POA: Diagnosis not present

## 2019-05-01 DIAGNOSIS — I252 Old myocardial infarction: Secondary | ICD-10-CM | POA: Diagnosis not present

## 2019-05-01 DIAGNOSIS — I110 Hypertensive heart disease with heart failure: Secondary | ICD-10-CM | POA: Diagnosis not present

## 2019-05-01 DIAGNOSIS — Z87891 Personal history of nicotine dependence: Secondary | ICD-10-CM | POA: Diagnosis not present

## 2019-05-02 ENCOUNTER — Ambulatory Visit (INDEPENDENT_AMBULATORY_CARE_PROVIDER_SITE_OTHER): Payer: Medicare Other | Admitting: Cardiovascular Disease

## 2019-05-02 ENCOUNTER — Encounter: Payer: Self-pay | Admitting: Cardiovascular Disease

## 2019-05-02 ENCOUNTER — Other Ambulatory Visit: Payer: Self-pay

## 2019-05-02 VITALS — BP 158/82 | HR 83 | Ht 68.0 in | Wt 258.0 lb

## 2019-05-02 DIAGNOSIS — I5032 Chronic diastolic (congestive) heart failure: Secondary | ICD-10-CM

## 2019-05-02 DIAGNOSIS — Z7901 Long term (current) use of anticoagulants: Secondary | ICD-10-CM | POA: Diagnosis not present

## 2019-05-02 DIAGNOSIS — I69354 Hemiplegia and hemiparesis following cerebral infarction affecting left non-dominant side: Secondary | ICD-10-CM | POA: Diagnosis not present

## 2019-05-02 DIAGNOSIS — Z87891 Personal history of nicotine dependence: Secondary | ICD-10-CM | POA: Diagnosis not present

## 2019-05-02 DIAGNOSIS — I25708 Atherosclerosis of coronary artery bypass graft(s), unspecified, with other forms of angina pectoris: Secondary | ICD-10-CM | POA: Diagnosis not present

## 2019-05-02 DIAGNOSIS — Z7982 Long term (current) use of aspirin: Secondary | ICD-10-CM | POA: Diagnosis not present

## 2019-05-02 DIAGNOSIS — Z951 Presence of aortocoronary bypass graft: Secondary | ICD-10-CM | POA: Diagnosis not present

## 2019-05-02 DIAGNOSIS — I4819 Other persistent atrial fibrillation: Secondary | ICD-10-CM

## 2019-05-02 DIAGNOSIS — G4733 Obstructive sleep apnea (adult) (pediatric): Secondary | ICD-10-CM | POA: Diagnosis not present

## 2019-05-02 DIAGNOSIS — I2510 Atherosclerotic heart disease of native coronary artery without angina pectoris: Secondary | ICD-10-CM | POA: Diagnosis not present

## 2019-05-02 DIAGNOSIS — Z48812 Encounter for surgical aftercare following surgery on the circulatory system: Secondary | ICD-10-CM | POA: Diagnosis not present

## 2019-05-02 DIAGNOSIS — E785 Hyperlipidemia, unspecified: Secondary | ICD-10-CM | POA: Diagnosis not present

## 2019-05-02 DIAGNOSIS — I10 Essential (primary) hypertension: Secondary | ICD-10-CM

## 2019-05-02 DIAGNOSIS — E46 Unspecified protein-calorie malnutrition: Secondary | ICD-10-CM | POA: Diagnosis not present

## 2019-05-02 DIAGNOSIS — Z8673 Personal history of transient ischemic attack (TIA), and cerebral infarction without residual deficits: Secondary | ICD-10-CM

## 2019-05-02 DIAGNOSIS — G4700 Insomnia, unspecified: Secondary | ICD-10-CM | POA: Diagnosis not present

## 2019-05-02 DIAGNOSIS — M1991 Primary osteoarthritis, unspecified site: Secondary | ICD-10-CM | POA: Diagnosis not present

## 2019-05-02 DIAGNOSIS — I5030 Unspecified diastolic (congestive) heart failure: Secondary | ICD-10-CM | POA: Diagnosis not present

## 2019-05-02 DIAGNOSIS — M109 Gout, unspecified: Secondary | ICD-10-CM | POA: Diagnosis not present

## 2019-05-02 DIAGNOSIS — I252 Old myocardial infarction: Secondary | ICD-10-CM | POA: Diagnosis not present

## 2019-05-02 DIAGNOSIS — I110 Hypertensive heart disease with heart failure: Secondary | ICD-10-CM | POA: Diagnosis not present

## 2019-05-02 DIAGNOSIS — Z86718 Personal history of other venous thrombosis and embolism: Secondary | ICD-10-CM | POA: Diagnosis not present

## 2019-05-02 DIAGNOSIS — Z9181 History of falling: Secondary | ICD-10-CM | POA: Diagnosis not present

## 2019-05-02 DIAGNOSIS — R339 Retention of urine, unspecified: Secondary | ICD-10-CM | POA: Diagnosis not present

## 2019-05-02 MED ORDER — PREDNISONE 10 MG PO TABS: 10.0000 mg | ORAL_TABLET | Freq: Every day | ORAL | 0 refills | Status: DC

## 2019-05-02 NOTE — Progress Notes (Signed)
SUBJECTIVE: The patient presents for follow-up after undergoing three-vessel CABG on 03/10/2019.  He had preoperative intermittent atrial fibrillation.  He developed a postoperative CVA.  He is walking with a walker and receives physical and occupational therapy.  He is here with his son.  He is weak but getting stronger.  He denies chest pain.  He denies orthopnea, palpitations and paroxysmal nocturnal dyspnea.  His primary complaints relate to acute gout of the left shoulder and right wrist.  He is on colchicine.      Review of Systems: As per "subjective", otherwise negative.  Allergies  Allergen Reactions  . Penicillins Hives and Rash    Has patient had a PCN reaction causing immediate rash, facial/tongue/throat swelling, SOB or lightheadedness with hypotension: Unknown Has patient had a PCN reaction causing severe rash involving mucus membranes or skin necrosis: Unknown Has patient had a PCN reaction that required hospitalization: No Has patient had a PCN reaction occurring within the last 10 years: No If all of the above answers are "NO", then may proceed with Cephalosporin use.      Current Outpatient Medications  Medication Sig Dispense Refill  . acetaminophen (TYLENOL) 325 MG tablet Take 1-2 tablets (325-650 mg total) by mouth every 4 (four) hours as needed for mild pain.    Marland Kitchen allopurinol (ZYLOPRIM) 300 MG tablet Take 300 mg by mouth every morning.     . Amino Acids-Protein Hydrolys (FEEDING SUPPLEMENT, PRO-STAT SUGAR FREE 64,) LIQD Take 30 mLs by mouth 2 (two) times daily. 887 mL 0  . amiodarone (PACERONE) 200 MG tablet Take 1 tablet (200 mg total) by mouth daily. 30 tablet 0  . apixaban (ELIQUIS) 5 MG TABS tablet Take 1 tablet (5 mg total) by mouth 2 (two) times daily. 180 tablet 1  . aspirin EC 81 MG EC tablet Take 1 tablet (81 mg total) by mouth daily. 100 tablet 1  . atorvastatin (LIPITOR) 80 MG tablet Take 1 tablet (80 mg total) by mouth daily. (Patient  taking differently: Take 80 mg by mouth every evening. ) 90 tablet 2  . Cholecalciferol (VITAMIN D-3) 1000 UNITS CAPS Take 1,000 Units by mouth every morning.     . citalopram (CELEXA) 10 MG tablet Take 1 tablet (10 mg total) by mouth daily. 30 tablet 0  . colchicine 0.6 MG tablet Take 1 tablet (0.6 mg total) by mouth daily at 6 PM. 30 tablet 0  . Cyanocobalamin (VITAMIN B-12) 2500 MCG SUBL Place 2,500 mcg under the tongue every morning.     . diclofenac sodium (VOLTAREN) 1 % GEL Apply 2 g topically 4 (four) times daily. 350 g 1  . docusate sodium (COLACE) 100 MG capsule Take 2 capsules (200 mg total) by mouth daily. 60 capsule 0  . fluticasone (FLONASE) 50 MCG/ACT nasal spray Place 2 sprays into the nose daily.      . folic acid (FOLVITE) 1 MG tablet TAKE ONE TABLET BY MOUTH EVERY DAY (Patient taking differently: Take 1 mg by mouth every morning. ) 30 tablet 6  . furosemide (LASIX) 20 MG tablet Take 1 tablet (20 mg total) by mouth 2 (two) times daily. 60 tablet 0  . guaiFENesin (MUCINEX) 600 MG 12 hr tablet Take 1 tablet (600 mg total) by mouth 2 (two) times daily as needed for cough. 24 tablet 0  . loratadine (CLARITIN) 10 MG tablet Take 10 mg by mouth every morning.     . meclizine (ANTIVERT) 32 MG tablet Take 32  mg by mouth daily as needed for dizziness.     . metoprolol tartrate (LOPRESSOR) 25 MG tablet Take 0.5 tablets (12.5 mg total) by mouth every morning. 30 tablet 0  . mometasone-formoterol (DULERA) 200-5 MCG/ACT AERO Inhale 2 puffs into the lungs 2 (two) times daily. 13 g 0  . nitroGLYCERIN (NITROSTAT) 0.4 MG SL tablet PLACE 1 TABLET (0.4 MG TOTAL) UNDER THE TONGUE EVERY 5 (FIVE) MINUTES AS NEEDED. (Patient taking differently: Place 0.4 mg under the tongue every 5 (five) minutes as needed for chest pain. ) 25 tablet 3  . pantoprazole (PROTONIX) 40 MG tablet Take 1 tablet (40 mg total) by mouth daily. 30 tablet 0  . polyethylene glycol (MIRALAX / GLYCOLAX) packet Take 17 g by mouth 3  (three) times a week.     . potassium chloride (KLOR-CON M15) 15 MEQ tablet Take 2 tablets (30 mEq total) by mouth daily. 60 tablet 0  . tamsulosin (FLOMAX) 0.4 MG CAPS capsule Take 1 capsule (0.4 mg total) by mouth 2 (two) times daily. 60 capsule 0  . thiamine 100 MG tablet Take 1 tablet (100 mg total) by mouth daily. 30 tablet 0  . traZODone (DESYREL) 100 MG tablet Take 1 tablet (100 mg total) by mouth at bedtime as needed for sleep. 30 tablet 0   No current facility-administered medications for this visit.     Past Medical History:  Diagnosis Date  . Atrial fibrillation ([place])    a. dx 10/2017. b. s/p DCCV on 11/19/2017 with successful conversion to NSR but back in AFIB 3 weeks later  . CAD (coronary artery disease)    a. s/p PCI to LAD 2001. b. s/p stenting to the RCA and mid LAD December 2011, residual distal RCA disease treated medically. b. nuc 02/2013 abnormal with fixed defect seen in inferoapical, mid-inferior, and basal inferior walls, indicative of myocardial scar, no evidence of ischemia, EF 41% (Ef 55-60% by echo same time).  . Carotid artery disease ([place])    carotid Doppler course left side 5069% stenosis  . Dysrhythmia    AFib  . Habitual alcohol use   . Hyperlipidemia   . Hypertension   . Low back pain   . OSA on CPAP   . Osteoarthritis   . Polysubstance abuse ([place])    use including marijuana and vicodin,which he obtained from the street  . PVC's (premature ventricular contractions)    status post Holter monitor normal sinus rhythm otherwise asymptomatic PVCs.  . Seasonal allergies     Past Surgical History:  Procedure Laterality Date  . CARDIOVERSION N/A 11/19/2017   Procedure: CARDIOVERSION;  Surgeon: Herminio Commons, MD;  [facility];  Service: Cardiovascular;  Laterality: N/A;  . CORONARY ANGIOPLASTY WITH STENT PLACEMENT  2011  . CORONARY ARTERY BYPASS GRAFT N/A 03/10/2019   Procedure: CORONARY ARTERY BYPASS GRAFTING (CABG)X 3 Using Left Internal  Mammary Artery and Right Saphenous Vein Grafts;  Surgeon: Ivin Poot, MD;  Location: [facility];  Service: Open Heart Surgery;  Laterality: N/A;  . LEFT HEART CATH AND CORONARY ANGIOGRAPHY N/A 03/08/2019   Procedure: LEFT HEART CATH AND CORONARY ANGIOGRAPHY;  Surgeon: Belva Crome, MD;  Location: [facility];  Service: Cardiovascular;  Laterality: N/A;  . TEE WITHOUT CARDIOVERSION N/A 03/10/2019   Procedure: TRANSESOPHAGEAL ECHOCARDIOGRAM (TEE);  Surgeon: Prescott Gum, Collier Salina, MD;  Location: [facility];  Service: Open Heart Surgery;  Laterality: N/A;  . TONSILLECTOMY      Social History  Socioeconomic History  . Marital status: Married    Spouse name: Not on file  . Number of children: 1  . Years of education: Not on file  . Highest education level: 9th grade  Occupational History    Employer: UNEMPLOYED    Comment: works Social worker employed  Social Needs  . Financial resource strain: Not on file  . Food insecurity    Worry: Not on file    Inability: Not on file  . Transportation needs    Medical: Not on file    Non-medical: Not on file  Tobacco Use  . Smoking status: Former Smoker    Packs/day: 1.00    Years: 35.00    Pack years: 35.00    Types: Cigarettes    Start date: 09/18/1967    Quit date: 06/09/1999    Years since quitting: 19.9  . Smokeless tobacco: Never Used  Substance and Sexual Activity  . Alcohol use: Yes    Alcohol/week: 42.0 standard drinks    Types: 42 Shots of liquor per week    Comment: 1/2 gallon og liquor per week  . Drug use: Yes    Types: Marijuana    Comment: smokes marijuana occassionally-last smoked few weeks ago  . Sexual activity: Yes    Birth control/protection: None  Lifestyle  . Physical activity    Days per week: Not on file    Minutes per session: Not on file  . Stress: Not on file  Relationships  . Social Herbalist on phone: Not on file    Gets together: Not on file    Attends religious service: Not on file     Active member of club or organization: Not on file    Attends meetings of clubs or organizations: Not on file    Relationship status: Not on file  . Intimate partner violence    Fear of current or ex partner: Not on file    Emotionally abused: Not on file    Physically abused: Not on file    Forced sexual activity: Not on file  Other Topics Concern  . Not on file  Social History Narrative   No exercise   Pt lives with spouse in 1 story home   Right handed   Drinks coffee every morning, not tea, no soda   One son high level of education is 9th grade     Vitals:   05/02/19 1412  BP: (!) 158/82  Pulse: 83  SpO2: 98%  Weight: 258 lb (117 kg)  Height: 5\' 8"  (1.727 m)    Wt Readings from Last 3 Encounters:  05/02/19 258 lb (117 kg)  04/26/19 255 lb (115.7 kg)  04/24/19 258 lb (117 kg)     PHYSICAL EXAM General: NAD HEENT: Normal. Neck: No JVD, no thyromegaly. Lungs: Clear to auscultation bilaterally with normal respiratory effort. CV: Regular rate and irregular rhythm, normal S1/S2, no S3, no murmur.  Wearing compression stockings.   Abdomen: Soft, nontender, no distention.  Neurologic: Alert and oriented.  Psych: Normal affect. Skin: Normal. Musculoskeletal: No gross deformities.      Labs: Lab Results  Component Value Date/Time   K 3.6 04/10/2019 04:54 AM   BUN 14 04/10/2019 04:54 AM   CREATININE 0.79 04/10/2019 04:54 AM   ALT 37 03/28/2019 05:12 AM   TSH 10.690 (H) 03/12/2019 08:03 AM   TSH 2.546 05/20/2010 07:38 PM   HGB 10.7 (L) 04/10/2019 04:54 AM     Lipids: Lab  Results  Component Value Date/Time   LDLCALC 85 03/07/2019 11:58 PM   CHOL 170 03/07/2019 11:58 PM   TRIG 218 (H) 03/07/2019 11:58 PM   HDL 41 03/07/2019 11:58 PM      Echo 03/08/19: IMPRESSIONS  1. Left ventricular ejection fraction, by visual estimation, is 55 to 60%. The left ventricle has normal function. Normal left ventricular size. There is mildly increased left ventricular  hypertrophy. Basal inferior akinesis. 2. Left ventricular diastolic Doppler parameters are consistent with impaired relaxation pattern of LV diastolic filling. 3. Global right ventricle has normal systolic function.The right ventricular size is normal. No increase in right ventricular wall thickness. 4. The aortic valve is tricuspid Aortic valve regurgitation is trivial by color flow Doppler. Mild aortic valve stenosis. Mean gradient 10 mmHg. 5. There is mild dilatation of the ascending aorta measuring 42 mm. 6. Left atrial size was moderately dilated. 7. Right atrial size was mildly dilated. 8. The tricuspid valve is normal in structure. Tricuspid valve regurgitation was not visualized by color flow Doppler. 9. The mitral valve is normal in structure. No evidence of mitral valve regurgitation. No evidence of mitral stenosis. 10. TR signal is inadequate for assessing pulmonary artery systolic pressure. 11. The inferior vena cava is normal in size with greater than 50% respiratory variability, suggesting right atrial pressure of 3 mmHg.  Carotid Dopplers 03/09/2019:1 to 39% ICA stenosis bilaterally.  Left heart cath 03/08/2019:  Severe three-vessel coronary calcification  Patent left main  Eccentric ostial 50 to 75% LAD with mid vessel 40 to 50% narrowing. Mid to distal LAD stent is widely patent.  Ramus intermedius contains proximal 90% stenosis.  Circumflex contains 99% proximal/ostial stenosis.  RCA is heavily calcified and stented. The artery is totally occluded in the proximal to mid segment. Left-to-right collaterals are noted.  Mildly reduced LV systolic function with EF estimated to be 40%. LVEDP is 28 mmHg. Findings compatible with acute on chronic combined     ASSESSMENT AND PLAN: 1.  Coronary artery disease: Status post three-vessel CABG in October 2020.  Currently on aspirin 81 mg, metoprolol 25 mg every morning and atorvastatin.  Symptomatically stable  2.   History of CVA: Receiving physical and occupational therapy.  On aspirin and atorvastatin.  3.  Chronic diastolic heart failure: On Lasix 20 mg twice daily and supplemental potassium.  No evidence of hypervolemia.  Also wearing compression stockings.  4.  Hypertension: Blood pressure is elevated.  He is in pain due to gout.  5.  Persistent atrial fibrillation: Currently on amiodarone 200 mg daily and metoprolol 25 mg every morning.  Anticoagulated with Eliquis 5 mg twice daily.  Heart rate is controlled.  Symptomatically stable.  6.  Hyperlipidemia: Currently on atorvastatin.  7.  Acute gout: He has gout of the left shoulder and right wrist.  He is on colchicine.  I will prescribe a prednisone taper.   Disposition: Follow up 6 months  Time spent: 40 minutes, of which greater than 50% was spent reviewing symptoms, relevant blood tests and studies, and discussing management plan with the patient.    Kate Sable, M.D., F.A.C.C.

## 2019-05-02 NOTE — Patient Instructions (Addendum)
Medication Instructions:   Your physician has recommended you make the following change in your medication:   Take prednisone 10 mg by mouth daily for 3 days, then reduce to 5 mg daily for 3 days, then stop  Continue other medications the same  Labwork:  NONE  Testing/Procedures:  NONE  Follow-Up:  Your physician recommends that you schedule a follow-up appointment in: 6 months for a virtual visit. You will receive a reminder letter in the mail in about 4 months reminding you to call and schedule your appointment. If you don't receive this letter, please contact our office.  Any Other Special Instructions Will Be Listed Below (If Applicable).  If you need a refill on your cardiac medications before your next appointment, please call your pharmacy.

## 2019-05-03 ENCOUNTER — Other Ambulatory Visit: Payer: Self-pay | Admitting: Physical Medicine and Rehabilitation

## 2019-05-03 DIAGNOSIS — E785 Hyperlipidemia, unspecified: Secondary | ICD-10-CM | POA: Diagnosis not present

## 2019-05-03 DIAGNOSIS — G4700 Insomnia, unspecified: Secondary | ICD-10-CM | POA: Diagnosis not present

## 2019-05-03 DIAGNOSIS — Z7901 Long term (current) use of anticoagulants: Secondary | ICD-10-CM | POA: Diagnosis not present

## 2019-05-03 DIAGNOSIS — Z951 Presence of aortocoronary bypass graft: Secondary | ICD-10-CM | POA: Diagnosis not present

## 2019-05-03 DIAGNOSIS — Z86718 Personal history of other venous thrombosis and embolism: Secondary | ICD-10-CM | POA: Diagnosis not present

## 2019-05-03 DIAGNOSIS — I110 Hypertensive heart disease with heart failure: Secondary | ICD-10-CM | POA: Diagnosis not present

## 2019-05-03 DIAGNOSIS — I4819 Other persistent atrial fibrillation: Secondary | ICD-10-CM | POA: Diagnosis not present

## 2019-05-03 DIAGNOSIS — I5030 Unspecified diastolic (congestive) heart failure: Secondary | ICD-10-CM | POA: Diagnosis not present

## 2019-05-03 DIAGNOSIS — E46 Unspecified protein-calorie malnutrition: Secondary | ICD-10-CM | POA: Diagnosis not present

## 2019-05-03 DIAGNOSIS — I252 Old myocardial infarction: Secondary | ICD-10-CM | POA: Diagnosis not present

## 2019-05-03 DIAGNOSIS — Z9181 History of falling: Secondary | ICD-10-CM | POA: Diagnosis not present

## 2019-05-03 DIAGNOSIS — I69354 Hemiplegia and hemiparesis following cerebral infarction affecting left non-dominant side: Secondary | ICD-10-CM | POA: Diagnosis not present

## 2019-05-03 DIAGNOSIS — R339 Retention of urine, unspecified: Secondary | ICD-10-CM | POA: Diagnosis not present

## 2019-05-03 DIAGNOSIS — G4733 Obstructive sleep apnea (adult) (pediatric): Secondary | ICD-10-CM | POA: Diagnosis not present

## 2019-05-03 DIAGNOSIS — Z7982 Long term (current) use of aspirin: Secondary | ICD-10-CM | POA: Diagnosis not present

## 2019-05-03 DIAGNOSIS — Z87891 Personal history of nicotine dependence: Secondary | ICD-10-CM | POA: Diagnosis not present

## 2019-05-03 DIAGNOSIS — M1991 Primary osteoarthritis, unspecified site: Secondary | ICD-10-CM | POA: Diagnosis not present

## 2019-05-03 DIAGNOSIS — M109 Gout, unspecified: Secondary | ICD-10-CM | POA: Diagnosis not present

## 2019-05-03 DIAGNOSIS — Z48812 Encounter for surgical aftercare following surgery on the circulatory system: Secondary | ICD-10-CM | POA: Diagnosis not present

## 2019-05-03 DIAGNOSIS — I2510 Atherosclerotic heart disease of native coronary artery without angina pectoris: Secondary | ICD-10-CM | POA: Diagnosis not present

## 2019-05-05 ENCOUNTER — Other Ambulatory Visit: Payer: Self-pay | Admitting: Physical Medicine and Rehabilitation

## 2019-05-05 DIAGNOSIS — Z951 Presence of aortocoronary bypass graft: Secondary | ICD-10-CM | POA: Diagnosis not present

## 2019-05-05 DIAGNOSIS — E785 Hyperlipidemia, unspecified: Secondary | ICD-10-CM | POA: Diagnosis not present

## 2019-05-05 DIAGNOSIS — M1991 Primary osteoarthritis, unspecified site: Secondary | ICD-10-CM | POA: Diagnosis not present

## 2019-05-05 DIAGNOSIS — Z48812 Encounter for surgical aftercare following surgery on the circulatory system: Secondary | ICD-10-CM | POA: Diagnosis not present

## 2019-05-05 DIAGNOSIS — Z86718 Personal history of other venous thrombosis and embolism: Secondary | ICD-10-CM | POA: Diagnosis not present

## 2019-05-05 DIAGNOSIS — I110 Hypertensive heart disease with heart failure: Secondary | ICD-10-CM | POA: Diagnosis not present

## 2019-05-05 DIAGNOSIS — Z9181 History of falling: Secondary | ICD-10-CM | POA: Diagnosis not present

## 2019-05-05 DIAGNOSIS — M109 Gout, unspecified: Secondary | ICD-10-CM | POA: Diagnosis not present

## 2019-05-05 DIAGNOSIS — I5030 Unspecified diastolic (congestive) heart failure: Secondary | ICD-10-CM | POA: Diagnosis not present

## 2019-05-05 DIAGNOSIS — G4700 Insomnia, unspecified: Secondary | ICD-10-CM | POA: Diagnosis not present

## 2019-05-05 DIAGNOSIS — Z87891 Personal history of nicotine dependence: Secondary | ICD-10-CM | POA: Diagnosis not present

## 2019-05-05 DIAGNOSIS — Z7901 Long term (current) use of anticoagulants: Secondary | ICD-10-CM | POA: Diagnosis not present

## 2019-05-05 DIAGNOSIS — I252 Old myocardial infarction: Secondary | ICD-10-CM | POA: Diagnosis not present

## 2019-05-05 DIAGNOSIS — I4819 Other persistent atrial fibrillation: Secondary | ICD-10-CM | POA: Diagnosis not present

## 2019-05-05 DIAGNOSIS — Z7982 Long term (current) use of aspirin: Secondary | ICD-10-CM | POA: Diagnosis not present

## 2019-05-05 DIAGNOSIS — I2510 Atherosclerotic heart disease of native coronary artery without angina pectoris: Secondary | ICD-10-CM | POA: Diagnosis not present

## 2019-05-05 DIAGNOSIS — G4733 Obstructive sleep apnea (adult) (pediatric): Secondary | ICD-10-CM | POA: Diagnosis not present

## 2019-05-05 DIAGNOSIS — R339 Retention of urine, unspecified: Secondary | ICD-10-CM | POA: Diagnosis not present

## 2019-05-05 DIAGNOSIS — I69354 Hemiplegia and hemiparesis following cerebral infarction affecting left non-dominant side: Secondary | ICD-10-CM | POA: Diagnosis not present

## 2019-05-05 DIAGNOSIS — E46 Unspecified protein-calorie malnutrition: Secondary | ICD-10-CM | POA: Diagnosis not present

## 2019-05-08 ENCOUNTER — Other Ambulatory Visit: Payer: Self-pay | Admitting: Physical Medicine and Rehabilitation

## 2019-05-08 DIAGNOSIS — Z48812 Encounter for surgical aftercare following surgery on the circulatory system: Secondary | ICD-10-CM | POA: Diagnosis not present

## 2019-05-08 DIAGNOSIS — I5030 Unspecified diastolic (congestive) heart failure: Secondary | ICD-10-CM | POA: Diagnosis not present

## 2019-05-08 DIAGNOSIS — I69354 Hemiplegia and hemiparesis following cerebral infarction affecting left non-dominant side: Secondary | ICD-10-CM | POA: Diagnosis not present

## 2019-05-08 DIAGNOSIS — E46 Unspecified protein-calorie malnutrition: Secondary | ICD-10-CM | POA: Diagnosis not present

## 2019-05-08 DIAGNOSIS — Z951 Presence of aortocoronary bypass graft: Secondary | ICD-10-CM | POA: Diagnosis not present

## 2019-05-08 DIAGNOSIS — Z9181 History of falling: Secondary | ICD-10-CM | POA: Diagnosis not present

## 2019-05-08 DIAGNOSIS — G4733 Obstructive sleep apnea (adult) (pediatric): Secondary | ICD-10-CM | POA: Diagnosis not present

## 2019-05-08 DIAGNOSIS — E785 Hyperlipidemia, unspecified: Secondary | ICD-10-CM | POA: Diagnosis not present

## 2019-05-08 DIAGNOSIS — R339 Retention of urine, unspecified: Secondary | ICD-10-CM | POA: Diagnosis not present

## 2019-05-08 DIAGNOSIS — I110 Hypertensive heart disease with heart failure: Secondary | ICD-10-CM | POA: Diagnosis not present

## 2019-05-08 DIAGNOSIS — Z86718 Personal history of other venous thrombosis and embolism: Secondary | ICD-10-CM | POA: Diagnosis not present

## 2019-05-08 DIAGNOSIS — M109 Gout, unspecified: Secondary | ICD-10-CM | POA: Diagnosis not present

## 2019-05-08 DIAGNOSIS — Z7982 Long term (current) use of aspirin: Secondary | ICD-10-CM | POA: Diagnosis not present

## 2019-05-08 DIAGNOSIS — Z87891 Personal history of nicotine dependence: Secondary | ICD-10-CM | POA: Diagnosis not present

## 2019-05-08 DIAGNOSIS — G4700 Insomnia, unspecified: Secondary | ICD-10-CM | POA: Diagnosis not present

## 2019-05-08 DIAGNOSIS — I252 Old myocardial infarction: Secondary | ICD-10-CM | POA: Diagnosis not present

## 2019-05-08 DIAGNOSIS — M1991 Primary osteoarthritis, unspecified site: Secondary | ICD-10-CM | POA: Diagnosis not present

## 2019-05-08 DIAGNOSIS — Z7901 Long term (current) use of anticoagulants: Secondary | ICD-10-CM | POA: Diagnosis not present

## 2019-05-08 DIAGNOSIS — I2510 Atherosclerotic heart disease of native coronary artery without angina pectoris: Secondary | ICD-10-CM | POA: Diagnosis not present

## 2019-05-08 DIAGNOSIS — I4819 Other persistent atrial fibrillation: Secondary | ICD-10-CM | POA: Diagnosis not present

## 2019-05-08 NOTE — Telephone Encounter (Signed)
Needs to be refilled and managed by patients primary care provider. 

## 2019-05-09 ENCOUNTER — Other Ambulatory Visit: Payer: Self-pay | Admitting: Physical Medicine and Rehabilitation

## 2019-05-09 DIAGNOSIS — I69354 Hemiplegia and hemiparesis following cerebral infarction affecting left non-dominant side: Secondary | ICD-10-CM | POA: Diagnosis not present

## 2019-05-09 DIAGNOSIS — Z9181 History of falling: Secondary | ICD-10-CM | POA: Diagnosis not present

## 2019-05-09 DIAGNOSIS — I110 Hypertensive heart disease with heart failure: Secondary | ICD-10-CM | POA: Diagnosis not present

## 2019-05-09 DIAGNOSIS — I5030 Unspecified diastolic (congestive) heart failure: Secondary | ICD-10-CM | POA: Diagnosis not present

## 2019-05-09 DIAGNOSIS — M109 Gout, unspecified: Secondary | ICD-10-CM | POA: Diagnosis not present

## 2019-05-09 DIAGNOSIS — Z87891 Personal history of nicotine dependence: Secondary | ICD-10-CM | POA: Diagnosis not present

## 2019-05-09 DIAGNOSIS — Z7982 Long term (current) use of aspirin: Secondary | ICD-10-CM | POA: Diagnosis not present

## 2019-05-09 DIAGNOSIS — Z7901 Long term (current) use of anticoagulants: Secondary | ICD-10-CM | POA: Diagnosis not present

## 2019-05-09 DIAGNOSIS — R339 Retention of urine, unspecified: Secondary | ICD-10-CM | POA: Diagnosis not present

## 2019-05-09 DIAGNOSIS — Z48812 Encounter for surgical aftercare following surgery on the circulatory system: Secondary | ICD-10-CM | POA: Diagnosis not present

## 2019-05-09 DIAGNOSIS — Z951 Presence of aortocoronary bypass graft: Secondary | ICD-10-CM | POA: Diagnosis not present

## 2019-05-09 DIAGNOSIS — G4733 Obstructive sleep apnea (adult) (pediatric): Secondary | ICD-10-CM | POA: Diagnosis not present

## 2019-05-09 DIAGNOSIS — M1991 Primary osteoarthritis, unspecified site: Secondary | ICD-10-CM | POA: Diagnosis not present

## 2019-05-09 DIAGNOSIS — Z86718 Personal history of other venous thrombosis and embolism: Secondary | ICD-10-CM | POA: Diagnosis not present

## 2019-05-09 DIAGNOSIS — I2510 Atherosclerotic heart disease of native coronary artery without angina pectoris: Secondary | ICD-10-CM | POA: Diagnosis not present

## 2019-05-09 DIAGNOSIS — E46 Unspecified protein-calorie malnutrition: Secondary | ICD-10-CM | POA: Diagnosis not present

## 2019-05-09 DIAGNOSIS — I252 Old myocardial infarction: Secondary | ICD-10-CM | POA: Diagnosis not present

## 2019-05-09 DIAGNOSIS — I4819 Other persistent atrial fibrillation: Secondary | ICD-10-CM | POA: Diagnosis not present

## 2019-05-09 DIAGNOSIS — E785 Hyperlipidemia, unspecified: Secondary | ICD-10-CM | POA: Diagnosis not present

## 2019-05-09 DIAGNOSIS — G4700 Insomnia, unspecified: Secondary | ICD-10-CM | POA: Diagnosis not present

## 2019-05-09 NOTE — Telephone Encounter (Signed)
Needs to be refilled and managed by patients primary care provider. 

## 2019-05-10 ENCOUNTER — Telehealth (HOSPITAL_COMMUNITY): Payer: Self-pay | Admitting: Physical Medicine and Rehabilitation

## 2019-05-10 ENCOUNTER — Telehealth: Payer: Self-pay | Admitting: Cardiovascular Disease

## 2019-05-10 ENCOUNTER — Telehealth: Payer: Self-pay

## 2019-05-10 DIAGNOSIS — Z87891 Personal history of nicotine dependence: Secondary | ICD-10-CM | POA: Diagnosis not present

## 2019-05-10 DIAGNOSIS — E785 Hyperlipidemia, unspecified: Secondary | ICD-10-CM | POA: Diagnosis not present

## 2019-05-10 DIAGNOSIS — M1991 Primary osteoarthritis, unspecified site: Secondary | ICD-10-CM | POA: Diagnosis not present

## 2019-05-10 DIAGNOSIS — I69354 Hemiplegia and hemiparesis following cerebral infarction affecting left non-dominant side: Secondary | ICD-10-CM | POA: Diagnosis not present

## 2019-05-10 DIAGNOSIS — Z951 Presence of aortocoronary bypass graft: Secondary | ICD-10-CM | POA: Diagnosis not present

## 2019-05-10 DIAGNOSIS — I110 Hypertensive heart disease with heart failure: Secondary | ICD-10-CM | POA: Diagnosis not present

## 2019-05-10 DIAGNOSIS — I252 Old myocardial infarction: Secondary | ICD-10-CM | POA: Diagnosis not present

## 2019-05-10 DIAGNOSIS — I2510 Atherosclerotic heart disease of native coronary artery without angina pectoris: Secondary | ICD-10-CM | POA: Diagnosis not present

## 2019-05-10 DIAGNOSIS — Z86718 Personal history of other venous thrombosis and embolism: Secondary | ICD-10-CM | POA: Diagnosis not present

## 2019-05-10 DIAGNOSIS — Z7901 Long term (current) use of anticoagulants: Secondary | ICD-10-CM | POA: Diagnosis not present

## 2019-05-10 DIAGNOSIS — Z9181 History of falling: Secondary | ICD-10-CM | POA: Diagnosis not present

## 2019-05-10 DIAGNOSIS — G4700 Insomnia, unspecified: Secondary | ICD-10-CM | POA: Diagnosis not present

## 2019-05-10 DIAGNOSIS — R339 Retention of urine, unspecified: Secondary | ICD-10-CM | POA: Diagnosis not present

## 2019-05-10 DIAGNOSIS — R5381 Other malaise: Secondary | ICD-10-CM | POA: Diagnosis not present

## 2019-05-10 DIAGNOSIS — I4819 Other persistent atrial fibrillation: Secondary | ICD-10-CM | POA: Diagnosis not present

## 2019-05-10 DIAGNOSIS — M109 Gout, unspecified: Secondary | ICD-10-CM | POA: Diagnosis not present

## 2019-05-10 DIAGNOSIS — G4733 Obstructive sleep apnea (adult) (pediatric): Secondary | ICD-10-CM | POA: Diagnosis not present

## 2019-05-10 DIAGNOSIS — E46 Unspecified protein-calorie malnutrition: Secondary | ICD-10-CM | POA: Diagnosis not present

## 2019-05-10 DIAGNOSIS — Z7982 Long term (current) use of aspirin: Secondary | ICD-10-CM | POA: Diagnosis not present

## 2019-05-10 DIAGNOSIS — Z48812 Encounter for surgical aftercare following surgery on the circulatory system: Secondary | ICD-10-CM | POA: Diagnosis not present

## 2019-05-10 DIAGNOSIS — I5030 Unspecified diastolic (congestive) heart failure: Secondary | ICD-10-CM | POA: Diagnosis not present

## 2019-05-10 MED ORDER — AMIODARONE HCL 200 MG PO TABS: 200.0000 mg | ORAL_TABLET | Freq: Every day | ORAL | 3 refills | Status: DC

## 2019-05-10 MED ORDER — POTASSIUM CHLORIDE CRYS ER 15 MEQ PO TBCR: 30.0000 meq | EXTENDED_RELEASE_TABLET | Freq: Every day | ORAL | 3 refills | Status: DC

## 2019-05-10 MED ORDER — METOPROLOL TARTRATE 25 MG PO TABS: 25.0000 mg | ORAL_TABLET | ORAL | 3 refills | Status: DC

## 2019-05-10 NOTE — Telephone Encounter (Signed)
Martin [name], OT w/ [facility] called office to ask about pt's limitations w/ exercising upper body. Consulted Dr. Prescott Gum. Per his verbal order, informed Martin that pt cannot lift over 10 lbs and he is not to lift anything above shoulder level. He can use exercise band for arm strengthening. At 3 months post-op (06/10/19), he can increase weight limit to no more than 20 lbs.

## 2019-05-10 NOTE — Telephone Encounter (Signed)
Pt is needing refills on   amiodarone (PACERONE) 200 MG tablet [[id number]]   potassium chloride (KLOR-CON M15) 15 MEQ tablet [id number]   metoprolol tartrate (LOPRESSOR) 25 MG tablet [id number]

## 2019-05-10 NOTE — Telephone Encounter (Signed)
Patient called in a panic--has been trying to get his meds refilled but was told by PCP that they were not comfortable refilling them? Left message with cardiology that patient needed refills on cardiac meds--Amiodarone, Metoprolol and Kdur and if they could take over ordering his cardiac meds. Also have reached out to Dr. Juel Burrow office to clarify that we were managing patient during his rehab stay and that PCP takes over care after discharge. Message left that patient needs his medications refilled and to call with questions. Marland Kitchen

## 2019-05-11 DIAGNOSIS — Z9181 History of falling: Secondary | ICD-10-CM | POA: Diagnosis not present

## 2019-05-11 DIAGNOSIS — Z7982 Long term (current) use of aspirin: Secondary | ICD-10-CM | POA: Diagnosis not present

## 2019-05-11 DIAGNOSIS — M1991 Primary osteoarthritis, unspecified site: Secondary | ICD-10-CM | POA: Diagnosis not present

## 2019-05-11 DIAGNOSIS — E785 Hyperlipidemia, unspecified: Secondary | ICD-10-CM | POA: Diagnosis not present

## 2019-05-11 DIAGNOSIS — I110 Hypertensive heart disease with heart failure: Secondary | ICD-10-CM | POA: Diagnosis not present

## 2019-05-11 DIAGNOSIS — E46 Unspecified protein-calorie malnutrition: Secondary | ICD-10-CM | POA: Diagnosis not present

## 2019-05-11 DIAGNOSIS — Z7901 Long term (current) use of anticoagulants: Secondary | ICD-10-CM | POA: Diagnosis not present

## 2019-05-11 DIAGNOSIS — I69354 Hemiplegia and hemiparesis following cerebral infarction affecting left non-dominant side: Secondary | ICD-10-CM | POA: Diagnosis not present

## 2019-05-11 DIAGNOSIS — I5030 Unspecified diastolic (congestive) heart failure: Secondary | ICD-10-CM | POA: Diagnosis not present

## 2019-05-11 DIAGNOSIS — Z951 Presence of aortocoronary bypass graft: Secondary | ICD-10-CM | POA: Diagnosis not present

## 2019-05-11 DIAGNOSIS — Z86718 Personal history of other venous thrombosis and embolism: Secondary | ICD-10-CM | POA: Diagnosis not present

## 2019-05-11 DIAGNOSIS — M109 Gout, unspecified: Secondary | ICD-10-CM | POA: Diagnosis not present

## 2019-05-11 DIAGNOSIS — I252 Old myocardial infarction: Secondary | ICD-10-CM | POA: Diagnosis not present

## 2019-05-11 DIAGNOSIS — Z87891 Personal history of nicotine dependence: Secondary | ICD-10-CM | POA: Diagnosis not present

## 2019-05-11 DIAGNOSIS — G4733 Obstructive sleep apnea (adult) (pediatric): Secondary | ICD-10-CM | POA: Diagnosis not present

## 2019-05-11 DIAGNOSIS — I2510 Atherosclerotic heart disease of native coronary artery without angina pectoris: Secondary | ICD-10-CM | POA: Diagnosis not present

## 2019-05-11 DIAGNOSIS — I4819 Other persistent atrial fibrillation: Secondary | ICD-10-CM | POA: Diagnosis not present

## 2019-05-11 DIAGNOSIS — Z48812 Encounter for surgical aftercare following surgery on the circulatory system: Secondary | ICD-10-CM | POA: Diagnosis not present

## 2019-05-11 DIAGNOSIS — R339 Retention of urine, unspecified: Secondary | ICD-10-CM | POA: Diagnosis not present

## 2019-05-11 DIAGNOSIS — G4700 Insomnia, unspecified: Secondary | ICD-10-CM | POA: Diagnosis not present

## 2019-05-11 MED ORDER — AMIODARONE HCL 200 MG PO TABS: 200.0000 mg | ORAL_TABLET | Freq: Every day | ORAL | 3 refills | Status: DC

## 2019-05-11 MED ORDER — POTASSIUM CHLORIDE CRYS ER 15 MEQ PO TBCR: 30.0000 meq | EXTENDED_RELEASE_TABLET | Freq: Every day | ORAL | 3 refills | Status: DC

## 2019-05-11 MED ORDER — FUROSEMIDE 20 MG PO TABS: 20.0000 mg | ORAL_TABLET | Freq: Two times a day (BID) | ORAL | 3 refills | Status: DC

## 2019-05-11 MED ORDER — METOPROLOL TARTRATE 25 MG PO TABS: 25.0000 mg | ORAL_TABLET | ORAL | 3 refills | Status: DC

## 2019-05-11 NOTE — Addendum Note (Signed)
Addended by: Laurine Blazer on: 05/11/2019 02:34 PM   Modules accepted: Orders

## 2019-05-11 NOTE — Telephone Encounter (Addendum)
Patient calling [place] office today requesting refills on the following medications:   Amiodarone, Potassium, Lopressor, Lasix, & Colchicine.  Patient informed that all medications sent to [facility] for 90 day supply except the Colchicine.  He will need to get from pmd since using for gout.  Patient verbalized understanding.

## 2019-05-12 DIAGNOSIS — Z951 Presence of aortocoronary bypass graft: Secondary | ICD-10-CM | POA: Diagnosis not present

## 2019-05-12 DIAGNOSIS — Z7409 Other reduced mobility: Secondary | ICD-10-CM | POA: Diagnosis not present

## 2019-05-12 DIAGNOSIS — I69354 Hemiplegia and hemiparesis following cerebral infarction affecting left non-dominant side: Secondary | ICD-10-CM | POA: Diagnosis not present

## 2019-05-12 DIAGNOSIS — I63331 Cerebral infarction due to thrombosis of right posterior cerebral artery: Secondary | ICD-10-CM | POA: Diagnosis not present

## 2019-05-15 ENCOUNTER — Other Ambulatory Visit: Payer: Self-pay | Admitting: Physical Medicine and Rehabilitation

## 2019-05-15 DIAGNOSIS — I252 Old myocardial infarction: Secondary | ICD-10-CM | POA: Diagnosis not present

## 2019-05-15 DIAGNOSIS — E46 Unspecified protein-calorie malnutrition: Secondary | ICD-10-CM | POA: Diagnosis not present

## 2019-05-15 DIAGNOSIS — Z87891 Personal history of nicotine dependence: Secondary | ICD-10-CM | POA: Diagnosis not present

## 2019-05-15 DIAGNOSIS — I5030 Unspecified diastolic (congestive) heart failure: Secondary | ICD-10-CM | POA: Diagnosis not present

## 2019-05-15 DIAGNOSIS — Z7901 Long term (current) use of anticoagulants: Secondary | ICD-10-CM | POA: Diagnosis not present

## 2019-05-15 DIAGNOSIS — E785 Hyperlipidemia, unspecified: Secondary | ICD-10-CM | POA: Diagnosis not present

## 2019-05-15 DIAGNOSIS — Z86718 Personal history of other venous thrombosis and embolism: Secondary | ICD-10-CM | POA: Diagnosis not present

## 2019-05-15 DIAGNOSIS — M1991 Primary osteoarthritis, unspecified site: Secondary | ICD-10-CM | POA: Diagnosis not present

## 2019-05-15 DIAGNOSIS — R339 Retention of urine, unspecified: Secondary | ICD-10-CM | POA: Diagnosis not present

## 2019-05-15 DIAGNOSIS — I2510 Atherosclerotic heart disease of native coronary artery without angina pectoris: Secondary | ICD-10-CM | POA: Diagnosis not present

## 2019-05-15 DIAGNOSIS — I4819 Other persistent atrial fibrillation: Secondary | ICD-10-CM | POA: Diagnosis not present

## 2019-05-15 DIAGNOSIS — Z951 Presence of aortocoronary bypass graft: Secondary | ICD-10-CM | POA: Diagnosis not present

## 2019-05-15 DIAGNOSIS — Z7982 Long term (current) use of aspirin: Secondary | ICD-10-CM | POA: Diagnosis not present

## 2019-05-15 DIAGNOSIS — Z9181 History of falling: Secondary | ICD-10-CM | POA: Diagnosis not present

## 2019-05-15 DIAGNOSIS — I69354 Hemiplegia and hemiparesis following cerebral infarction affecting left non-dominant side: Secondary | ICD-10-CM | POA: Diagnosis not present

## 2019-05-15 DIAGNOSIS — G4733 Obstructive sleep apnea (adult) (pediatric): Secondary | ICD-10-CM | POA: Diagnosis not present

## 2019-05-15 DIAGNOSIS — Z48812 Encounter for surgical aftercare following surgery on the circulatory system: Secondary | ICD-10-CM | POA: Diagnosis not present

## 2019-05-15 DIAGNOSIS — M109 Gout, unspecified: Secondary | ICD-10-CM | POA: Diagnosis not present

## 2019-05-15 DIAGNOSIS — I110 Hypertensive heart disease with heart failure: Secondary | ICD-10-CM | POA: Diagnosis not present

## 2019-05-15 DIAGNOSIS — G4700 Insomnia, unspecified: Secondary | ICD-10-CM | POA: Diagnosis not present

## 2019-05-15 NOTE — Telephone Encounter (Signed)
Can refill. 

## 2019-05-15 NOTE — Telephone Encounter (Signed)
Recieved request for refill of colchicine medication, last note on 04-24-2019 stated:  Pain well controlled, no constipation, sleeping well, progressing with therapy, does not require medication renewals this visit. Followed up with neurology on 11/6 and received stroke education and medications.   Unsure if ok to refill medication.  Please advise.

## 2019-05-16 DIAGNOSIS — I110 Hypertensive heart disease with heart failure: Secondary | ICD-10-CM | POA: Diagnosis not present

## 2019-05-16 DIAGNOSIS — R339 Retention of urine, unspecified: Secondary | ICD-10-CM | POA: Diagnosis not present

## 2019-05-16 DIAGNOSIS — I69354 Hemiplegia and hemiparesis following cerebral infarction affecting left non-dominant side: Secondary | ICD-10-CM | POA: Diagnosis not present

## 2019-05-16 DIAGNOSIS — Z86718 Personal history of other venous thrombosis and embolism: Secondary | ICD-10-CM | POA: Diagnosis not present

## 2019-05-16 DIAGNOSIS — Z48812 Encounter for surgical aftercare following surgery on the circulatory system: Secondary | ICD-10-CM | POA: Diagnosis not present

## 2019-05-16 DIAGNOSIS — M1991 Primary osteoarthritis, unspecified site: Secondary | ICD-10-CM | POA: Diagnosis not present

## 2019-05-16 DIAGNOSIS — G4700 Insomnia, unspecified: Secondary | ICD-10-CM | POA: Diagnosis not present

## 2019-05-16 DIAGNOSIS — Z87891 Personal history of nicotine dependence: Secondary | ICD-10-CM | POA: Diagnosis not present

## 2019-05-16 DIAGNOSIS — I2510 Atherosclerotic heart disease of native coronary artery without angina pectoris: Secondary | ICD-10-CM | POA: Diagnosis not present

## 2019-05-16 DIAGNOSIS — I252 Old myocardial infarction: Secondary | ICD-10-CM | POA: Diagnosis not present

## 2019-05-16 DIAGNOSIS — Z9181 History of falling: Secondary | ICD-10-CM | POA: Diagnosis not present

## 2019-05-16 DIAGNOSIS — Z7901 Long term (current) use of anticoagulants: Secondary | ICD-10-CM | POA: Diagnosis not present

## 2019-05-16 DIAGNOSIS — Z7982 Long term (current) use of aspirin: Secondary | ICD-10-CM | POA: Diagnosis not present

## 2019-05-16 DIAGNOSIS — Z951 Presence of aortocoronary bypass graft: Secondary | ICD-10-CM | POA: Diagnosis not present

## 2019-05-16 DIAGNOSIS — E46 Unspecified protein-calorie malnutrition: Secondary | ICD-10-CM | POA: Diagnosis not present

## 2019-05-16 DIAGNOSIS — G4733 Obstructive sleep apnea (adult) (pediatric): Secondary | ICD-10-CM | POA: Diagnosis not present

## 2019-05-16 DIAGNOSIS — I5030 Unspecified diastolic (congestive) heart failure: Secondary | ICD-10-CM | POA: Diagnosis not present

## 2019-05-16 DIAGNOSIS — M109 Gout, unspecified: Secondary | ICD-10-CM | POA: Diagnosis not present

## 2019-05-16 DIAGNOSIS — E785 Hyperlipidemia, unspecified: Secondary | ICD-10-CM | POA: Diagnosis not present

## 2019-05-16 DIAGNOSIS — I4819 Other persistent atrial fibrillation: Secondary | ICD-10-CM | POA: Diagnosis not present

## 2019-05-17 DIAGNOSIS — Z951 Presence of aortocoronary bypass graft: Secondary | ICD-10-CM | POA: Diagnosis not present

## 2019-05-17 DIAGNOSIS — R339 Retention of urine, unspecified: Secondary | ICD-10-CM | POA: Diagnosis not present

## 2019-05-17 DIAGNOSIS — G4733 Obstructive sleep apnea (adult) (pediatric): Secondary | ICD-10-CM | POA: Diagnosis not present

## 2019-05-17 DIAGNOSIS — E785 Hyperlipidemia, unspecified: Secondary | ICD-10-CM | POA: Diagnosis not present

## 2019-05-17 DIAGNOSIS — Z87891 Personal history of nicotine dependence: Secondary | ICD-10-CM | POA: Diagnosis not present

## 2019-05-17 DIAGNOSIS — I252 Old myocardial infarction: Secondary | ICD-10-CM | POA: Diagnosis not present

## 2019-05-17 DIAGNOSIS — I69354 Hemiplegia and hemiparesis following cerebral infarction affecting left non-dominant side: Secondary | ICD-10-CM | POA: Diagnosis not present

## 2019-05-17 DIAGNOSIS — M109 Gout, unspecified: Secondary | ICD-10-CM | POA: Diagnosis not present

## 2019-05-17 DIAGNOSIS — I2510 Atherosclerotic heart disease of native coronary artery without angina pectoris: Secondary | ICD-10-CM | POA: Diagnosis not present

## 2019-05-17 DIAGNOSIS — Z48812 Encounter for surgical aftercare following surgery on the circulatory system: Secondary | ICD-10-CM | POA: Diagnosis not present

## 2019-05-17 DIAGNOSIS — E46 Unspecified protein-calorie malnutrition: Secondary | ICD-10-CM | POA: Diagnosis not present

## 2019-05-17 DIAGNOSIS — Z9181 History of falling: Secondary | ICD-10-CM | POA: Diagnosis not present

## 2019-05-17 DIAGNOSIS — I4819 Other persistent atrial fibrillation: Secondary | ICD-10-CM | POA: Diagnosis not present

## 2019-05-17 DIAGNOSIS — I5030 Unspecified diastolic (congestive) heart failure: Secondary | ICD-10-CM | POA: Diagnosis not present

## 2019-05-17 DIAGNOSIS — Z86718 Personal history of other venous thrombosis and embolism: Secondary | ICD-10-CM | POA: Diagnosis not present

## 2019-05-17 DIAGNOSIS — M1991 Primary osteoarthritis, unspecified site: Secondary | ICD-10-CM | POA: Diagnosis not present

## 2019-05-17 DIAGNOSIS — Z7982 Long term (current) use of aspirin: Secondary | ICD-10-CM | POA: Diagnosis not present

## 2019-05-17 DIAGNOSIS — G4700 Insomnia, unspecified: Secondary | ICD-10-CM | POA: Diagnosis not present

## 2019-05-17 DIAGNOSIS — I110 Hypertensive heart disease with heart failure: Secondary | ICD-10-CM | POA: Diagnosis not present

## 2019-05-17 DIAGNOSIS — Z7901 Long term (current) use of anticoagulants: Secondary | ICD-10-CM | POA: Diagnosis not present

## 2019-05-18 DIAGNOSIS — M109 Gout, unspecified: Secondary | ICD-10-CM | POA: Diagnosis not present

## 2019-05-18 DIAGNOSIS — Z9181 History of falling: Secondary | ICD-10-CM | POA: Diagnosis not present

## 2019-05-18 DIAGNOSIS — Z87891 Personal history of nicotine dependence: Secondary | ICD-10-CM | POA: Diagnosis not present

## 2019-05-18 DIAGNOSIS — Z86718 Personal history of other venous thrombosis and embolism: Secondary | ICD-10-CM | POA: Diagnosis not present

## 2019-05-18 DIAGNOSIS — G4700 Insomnia, unspecified: Secondary | ICD-10-CM | POA: Diagnosis not present

## 2019-05-18 DIAGNOSIS — M1991 Primary osteoarthritis, unspecified site: Secondary | ICD-10-CM | POA: Diagnosis not present

## 2019-05-18 DIAGNOSIS — Z951 Presence of aortocoronary bypass graft: Secondary | ICD-10-CM | POA: Diagnosis not present

## 2019-05-18 DIAGNOSIS — I252 Old myocardial infarction: Secondary | ICD-10-CM | POA: Diagnosis not present

## 2019-05-18 DIAGNOSIS — Z7982 Long term (current) use of aspirin: Secondary | ICD-10-CM | POA: Diagnosis not present

## 2019-05-18 DIAGNOSIS — I2510 Atherosclerotic heart disease of native coronary artery without angina pectoris: Secondary | ICD-10-CM | POA: Diagnosis not present

## 2019-05-18 DIAGNOSIS — E785 Hyperlipidemia, unspecified: Secondary | ICD-10-CM | POA: Diagnosis not present

## 2019-05-18 DIAGNOSIS — I5030 Unspecified diastolic (congestive) heart failure: Secondary | ICD-10-CM | POA: Diagnosis not present

## 2019-05-18 DIAGNOSIS — I110 Hypertensive heart disease with heart failure: Secondary | ICD-10-CM | POA: Diagnosis not present

## 2019-05-18 DIAGNOSIS — G4733 Obstructive sleep apnea (adult) (pediatric): Secondary | ICD-10-CM | POA: Diagnosis not present

## 2019-05-18 DIAGNOSIS — E46 Unspecified protein-calorie malnutrition: Secondary | ICD-10-CM | POA: Diagnosis not present

## 2019-05-18 DIAGNOSIS — R339 Retention of urine, unspecified: Secondary | ICD-10-CM | POA: Diagnosis not present

## 2019-05-18 DIAGNOSIS — Z7901 Long term (current) use of anticoagulants: Secondary | ICD-10-CM | POA: Diagnosis not present

## 2019-05-18 DIAGNOSIS — I4819 Other persistent atrial fibrillation: Secondary | ICD-10-CM | POA: Diagnosis not present

## 2019-05-18 DIAGNOSIS — I69354 Hemiplegia and hemiparesis following cerebral infarction affecting left non-dominant side: Secondary | ICD-10-CM | POA: Diagnosis not present

## 2019-05-18 DIAGNOSIS — Z48812 Encounter for surgical aftercare following surgery on the circulatory system: Secondary | ICD-10-CM | POA: Diagnosis not present

## 2019-05-19 ENCOUNTER — Other Ambulatory Visit: Payer: Self-pay

## 2019-05-19 ENCOUNTER — Other Ambulatory Visit: Payer: Self-pay | Admitting: Internal Medicine

## 2019-05-19 ENCOUNTER — Other Ambulatory Visit (HOSPITAL_COMMUNITY): Payer: Self-pay | Admitting: Internal Medicine

## 2019-05-19 DIAGNOSIS — Z86718 Personal history of other venous thrombosis and embolism: Secondary | ICD-10-CM | POA: Diagnosis not present

## 2019-05-19 DIAGNOSIS — S0093XA Contusion of unspecified part of head, initial encounter: Secondary | ICD-10-CM

## 2019-05-19 DIAGNOSIS — Z951 Presence of aortocoronary bypass graft: Secondary | ICD-10-CM | POA: Diagnosis not present

## 2019-05-19 DIAGNOSIS — E785 Hyperlipidemia, unspecified: Secondary | ICD-10-CM | POA: Diagnosis not present

## 2019-05-19 DIAGNOSIS — I4819 Other persistent atrial fibrillation: Secondary | ICD-10-CM | POA: Diagnosis not present

## 2019-05-19 DIAGNOSIS — G4700 Insomnia, unspecified: Secondary | ICD-10-CM | POA: Diagnosis not present

## 2019-05-19 DIAGNOSIS — G4733 Obstructive sleep apnea (adult) (pediatric): Secondary | ICD-10-CM | POA: Diagnosis not present

## 2019-05-19 DIAGNOSIS — I69354 Hemiplegia and hemiparesis following cerebral infarction affecting left non-dominant side: Secondary | ICD-10-CM | POA: Diagnosis not present

## 2019-05-19 DIAGNOSIS — I5030 Unspecified diastolic (congestive) heart failure: Secondary | ICD-10-CM | POA: Diagnosis not present

## 2019-05-19 DIAGNOSIS — Z48812 Encounter for surgical aftercare following surgery on the circulatory system: Secondary | ICD-10-CM | POA: Diagnosis not present

## 2019-05-19 DIAGNOSIS — I110 Hypertensive heart disease with heart failure: Secondary | ICD-10-CM | POA: Diagnosis not present

## 2019-05-19 DIAGNOSIS — R339 Retention of urine, unspecified: Secondary | ICD-10-CM | POA: Diagnosis not present

## 2019-05-19 DIAGNOSIS — Z7982 Long term (current) use of aspirin: Secondary | ICD-10-CM | POA: Diagnosis not present

## 2019-05-19 DIAGNOSIS — E46 Unspecified protein-calorie malnutrition: Secondary | ICD-10-CM | POA: Diagnosis not present

## 2019-05-19 DIAGNOSIS — Z7901 Long term (current) use of anticoagulants: Secondary | ICD-10-CM | POA: Diagnosis not present

## 2019-05-19 DIAGNOSIS — W19XXXA Unspecified fall, initial encounter: Secondary | ICD-10-CM

## 2019-05-19 DIAGNOSIS — M109 Gout, unspecified: Secondary | ICD-10-CM | POA: Diagnosis not present

## 2019-05-19 DIAGNOSIS — I252 Old myocardial infarction: Secondary | ICD-10-CM | POA: Diagnosis not present

## 2019-05-19 DIAGNOSIS — M1991 Primary osteoarthritis, unspecified site: Secondary | ICD-10-CM | POA: Diagnosis not present

## 2019-05-19 DIAGNOSIS — I2510 Atherosclerotic heart disease of native coronary artery without angina pectoris: Secondary | ICD-10-CM | POA: Diagnosis not present

## 2019-05-19 DIAGNOSIS — Z87891 Personal history of nicotine dependence: Secondary | ICD-10-CM | POA: Diagnosis not present

## 2019-05-19 DIAGNOSIS — Z9181 History of falling: Secondary | ICD-10-CM | POA: Diagnosis not present

## 2019-05-19 NOTE — Patient Outreach (Signed)
[name]) Care Management  05/19/2019  [name] 1956/05/21 [id number]   Medication Adherence call to [name] [facility] Identifiers Verify spoke with patient he is past due on Lisinopril 40 mg,patient explain he is no longer taking his medication,patient had heart surgery and he is taking a different medication. [name] is showing past due under [name].   [address] Management Direct Dial [phone]  Fax [phone] [name]

## 2019-05-22 DIAGNOSIS — Z9181 History of falling: Secondary | ICD-10-CM | POA: Diagnosis not present

## 2019-05-22 DIAGNOSIS — E46 Unspecified protein-calorie malnutrition: Secondary | ICD-10-CM | POA: Diagnosis not present

## 2019-05-22 DIAGNOSIS — I252 Old myocardial infarction: Secondary | ICD-10-CM | POA: Diagnosis not present

## 2019-05-22 DIAGNOSIS — Z7982 Long term (current) use of aspirin: Secondary | ICD-10-CM | POA: Diagnosis not present

## 2019-05-22 DIAGNOSIS — I5030 Unspecified diastolic (congestive) heart failure: Secondary | ICD-10-CM | POA: Diagnosis not present

## 2019-05-22 DIAGNOSIS — G4700 Insomnia, unspecified: Secondary | ICD-10-CM | POA: Diagnosis not present

## 2019-05-22 DIAGNOSIS — Z87891 Personal history of nicotine dependence: Secondary | ICD-10-CM | POA: Diagnosis not present

## 2019-05-22 DIAGNOSIS — G4733 Obstructive sleep apnea (adult) (pediatric): Secondary | ICD-10-CM | POA: Diagnosis not present

## 2019-05-22 DIAGNOSIS — I69354 Hemiplegia and hemiparesis following cerebral infarction affecting left non-dominant side: Secondary | ICD-10-CM | POA: Diagnosis not present

## 2019-05-22 DIAGNOSIS — Z86718 Personal history of other venous thrombosis and embolism: Secondary | ICD-10-CM | POA: Diagnosis not present

## 2019-05-22 DIAGNOSIS — Z951 Presence of aortocoronary bypass graft: Secondary | ICD-10-CM | POA: Diagnosis not present

## 2019-05-22 DIAGNOSIS — E785 Hyperlipidemia, unspecified: Secondary | ICD-10-CM | POA: Diagnosis not present

## 2019-05-22 DIAGNOSIS — Z7901 Long term (current) use of anticoagulants: Secondary | ICD-10-CM | POA: Diagnosis not present

## 2019-05-22 DIAGNOSIS — Z48812 Encounter for surgical aftercare following surgery on the circulatory system: Secondary | ICD-10-CM | POA: Diagnosis not present

## 2019-05-22 DIAGNOSIS — I2510 Atherosclerotic heart disease of native coronary artery without angina pectoris: Secondary | ICD-10-CM | POA: Diagnosis not present

## 2019-05-22 DIAGNOSIS — I4819 Other persistent atrial fibrillation: Secondary | ICD-10-CM | POA: Diagnosis not present

## 2019-05-22 DIAGNOSIS — R339 Retention of urine, unspecified: Secondary | ICD-10-CM | POA: Diagnosis not present

## 2019-05-22 DIAGNOSIS — M109 Gout, unspecified: Secondary | ICD-10-CM | POA: Diagnosis not present

## 2019-05-22 DIAGNOSIS — I110 Hypertensive heart disease with heart failure: Secondary | ICD-10-CM | POA: Diagnosis not present

## 2019-05-22 DIAGNOSIS — M1991 Primary osteoarthritis, unspecified site: Secondary | ICD-10-CM | POA: Diagnosis not present

## 2019-05-23 ENCOUNTER — Other Ambulatory Visit: Payer: Self-pay | Admitting: Cardiothoracic Surgery

## 2019-05-23 DIAGNOSIS — E46 Unspecified protein-calorie malnutrition: Secondary | ICD-10-CM | POA: Diagnosis not present

## 2019-05-23 DIAGNOSIS — R339 Retention of urine, unspecified: Secondary | ICD-10-CM | POA: Diagnosis not present

## 2019-05-23 DIAGNOSIS — Z7982 Long term (current) use of aspirin: Secondary | ICD-10-CM | POA: Diagnosis not present

## 2019-05-23 DIAGNOSIS — M1991 Primary osteoarthritis, unspecified site: Secondary | ICD-10-CM | POA: Diagnosis not present

## 2019-05-23 DIAGNOSIS — I69354 Hemiplegia and hemiparesis following cerebral infarction affecting left non-dominant side: Secondary | ICD-10-CM | POA: Diagnosis not present

## 2019-05-23 DIAGNOSIS — E785 Hyperlipidemia, unspecified: Secondary | ICD-10-CM | POA: Diagnosis not present

## 2019-05-23 DIAGNOSIS — I110 Hypertensive heart disease with heart failure: Secondary | ICD-10-CM | POA: Diagnosis not present

## 2019-05-23 DIAGNOSIS — Z951 Presence of aortocoronary bypass graft: Secondary | ICD-10-CM | POA: Diagnosis not present

## 2019-05-23 DIAGNOSIS — I5030 Unspecified diastolic (congestive) heart failure: Secondary | ICD-10-CM | POA: Diagnosis not present

## 2019-05-23 DIAGNOSIS — G4700 Insomnia, unspecified: Secondary | ICD-10-CM | POA: Diagnosis not present

## 2019-05-23 DIAGNOSIS — Z7901 Long term (current) use of anticoagulants: Secondary | ICD-10-CM | POA: Diagnosis not present

## 2019-05-23 DIAGNOSIS — M109 Gout, unspecified: Secondary | ICD-10-CM | POA: Diagnosis not present

## 2019-05-23 DIAGNOSIS — G4733 Obstructive sleep apnea (adult) (pediatric): Secondary | ICD-10-CM | POA: Diagnosis not present

## 2019-05-23 DIAGNOSIS — I4819 Other persistent atrial fibrillation: Secondary | ICD-10-CM | POA: Diagnosis not present

## 2019-05-23 DIAGNOSIS — I252 Old myocardial infarction: Secondary | ICD-10-CM | POA: Diagnosis not present

## 2019-05-23 DIAGNOSIS — I2510 Atherosclerotic heart disease of native coronary artery without angina pectoris: Secondary | ICD-10-CM | POA: Diagnosis not present

## 2019-05-23 DIAGNOSIS — Z86718 Personal history of other venous thrombosis and embolism: Secondary | ICD-10-CM | POA: Diagnosis not present

## 2019-05-23 DIAGNOSIS — Z87891 Personal history of nicotine dependence: Secondary | ICD-10-CM | POA: Diagnosis not present

## 2019-05-23 DIAGNOSIS — Z48812 Encounter for surgical aftercare following surgery on the circulatory system: Secondary | ICD-10-CM | POA: Diagnosis not present

## 2019-05-23 DIAGNOSIS — Z9181 History of falling: Secondary | ICD-10-CM | POA: Diagnosis not present

## 2019-05-23 DIAGNOSIS — I779 Disorder of arteries and arterioles, unspecified: Secondary | ICD-10-CM

## 2019-05-24 ENCOUNTER — Encounter: Payer: Medicare Other | Admitting: Cardiothoracic Surgery

## 2019-05-24 DIAGNOSIS — M1A0610 Idiopathic chronic gout, right knee, without tophus (tophi): Secondary | ICD-10-CM | POA: Diagnosis not present

## 2019-05-26 ENCOUNTER — Ambulatory Visit
Admission: RE | Admit: 2019-05-26 | Discharge: 2019-05-26 | Disposition: A | Discharge status: Home / Self Care | Payer: Medicare Other | Source: Ambulatory Visit | Attending: Cardiothoracic Surgery | Admitting: Cardiothoracic Surgery

## 2019-05-26 ENCOUNTER — Other Ambulatory Visit: Payer: Self-pay

## 2019-05-26 ENCOUNTER — Encounter: Payer: Self-pay | Admitting: Cardiothoracic Surgery

## 2019-05-26 ENCOUNTER — Ambulatory Visit (INDEPENDENT_AMBULATORY_CARE_PROVIDER_SITE_OTHER): Payer: Self-pay | Admitting: Cardiothoracic Surgery

## 2019-05-26 VITALS — BP 196/100 | HR 74 | Temp 98.1°F | Resp 20 | Ht 68.0 in | Wt 255.0 lb

## 2019-05-26 DIAGNOSIS — Z6839 Body mass index (BMI) 39.0-39.9, adult: Secondary | ICD-10-CM

## 2019-05-26 DIAGNOSIS — R339 Retention of urine, unspecified: Secondary | ICD-10-CM | POA: Diagnosis not present

## 2019-05-26 DIAGNOSIS — I5030 Unspecified diastolic (congestive) heart failure: Secondary | ICD-10-CM | POA: Diagnosis not present

## 2019-05-26 DIAGNOSIS — G4700 Insomnia, unspecified: Secondary | ICD-10-CM | POA: Diagnosis not present

## 2019-05-26 DIAGNOSIS — I779 Disorder of arteries and arterioles, unspecified: Secondary | ICD-10-CM

## 2019-05-26 DIAGNOSIS — I252 Old myocardial infarction: Secondary | ICD-10-CM | POA: Diagnosis not present

## 2019-05-26 DIAGNOSIS — I110 Hypertensive heart disease with heart failure: Secondary | ICD-10-CM | POA: Diagnosis not present

## 2019-05-26 DIAGNOSIS — J90 Pleural effusion, not elsewhere classified: Secondary | ICD-10-CM | POA: Diagnosis not present

## 2019-05-26 DIAGNOSIS — Z86718 Personal history of other venous thrombosis and embolism: Secondary | ICD-10-CM | POA: Diagnosis not present

## 2019-05-26 DIAGNOSIS — Z9181 History of falling: Secondary | ICD-10-CM | POA: Diagnosis not present

## 2019-05-26 DIAGNOSIS — I69354 Hemiplegia and hemiparesis following cerebral infarction affecting left non-dominant side: Secondary | ICD-10-CM | POA: Diagnosis not present

## 2019-05-26 DIAGNOSIS — Z951 Presence of aortocoronary bypass graft: Secondary | ICD-10-CM | POA: Diagnosis not present

## 2019-05-26 DIAGNOSIS — Z48812 Encounter for surgical aftercare following surgery on the circulatory system: Secondary | ICD-10-CM | POA: Diagnosis not present

## 2019-05-26 DIAGNOSIS — Z87891 Personal history of nicotine dependence: Secondary | ICD-10-CM | POA: Diagnosis not present

## 2019-05-26 DIAGNOSIS — F1920 Other psychoactive substance dependence, uncomplicated: Secondary | ICD-10-CM

## 2019-05-26 DIAGNOSIS — I2510 Atherosclerotic heart disease of native coronary artery without angina pectoris: Secondary | ICD-10-CM | POA: Diagnosis not present

## 2019-05-26 DIAGNOSIS — G4733 Obstructive sleep apnea (adult) (pediatric): Secondary | ICD-10-CM | POA: Diagnosis not present

## 2019-05-26 DIAGNOSIS — M1991 Primary osteoarthritis, unspecified site: Secondary | ICD-10-CM | POA: Diagnosis not present

## 2019-05-26 DIAGNOSIS — Z7901 Long term (current) use of anticoagulants: Secondary | ICD-10-CM | POA: Diagnosis not present

## 2019-05-26 DIAGNOSIS — M109 Gout, unspecified: Secondary | ICD-10-CM | POA: Diagnosis not present

## 2019-05-26 DIAGNOSIS — I4819 Other persistent atrial fibrillation: Secondary | ICD-10-CM | POA: Diagnosis not present

## 2019-05-26 DIAGNOSIS — E785 Hyperlipidemia, unspecified: Secondary | ICD-10-CM | POA: Diagnosis not present

## 2019-05-26 DIAGNOSIS — Z7982 Long term (current) use of aspirin: Secondary | ICD-10-CM | POA: Diagnosis not present

## 2019-05-26 DIAGNOSIS — E46 Unspecified protein-calorie malnutrition: Secondary | ICD-10-CM | POA: Diagnosis not present

## 2019-05-26 NOTE — Progress Notes (Signed)
PCP is [name], MD Referring Provider is [name], MD  Chief Complaint  Patient presents with  . Routine Post Op    1 month f/u with CXR    HPI: Scheduled 32-month follow-up after urgent CABG. Patient is a 63 year old hypertensive ex-smoker with chronic atrial fibrillation with CABG x3 October 2.  He developed focal left-sided weakness from perioperative stroke and was treated in inpatient rehab.  He is now at home receiving home physical therapy which is transitioning to teletherapy because of the Covid surge.  His heart rate remains controlled atrial fibrillation.  Blood pressures trending upward.  Fluid is well controlled and the patient is carefully followed by Dr. Jacinta Shoe in [place].  He had a recent fall due to dizziness but without injury.  He is recovering from an episode of gout treated effectively with a brief steroid taper, which has probably contributed to his rising blood pressure.  Chest x-ray was performed today which I personally viewed showing improved pulmonary aeration, clearing bilateral small pleural effusion. Past Medical History:  Diagnosis Date  . Atrial fibrillation ([place])    a. dx 10/2017. b. s/p DCCV on 11/19/2017 with successful conversion to NSR but back in AFIB 3 weeks later  . CAD (coronary artery disease)    a. s/p PCI to LAD 2001. b. s/p stenting to the RCA and mid LAD December 2011, residual distal RCA disease treated medically. b. nuc 02/2013 abnormal with fixed defect seen in inferoapical, mid-inferior, and basal inferior walls, indicative of myocardial scar, no evidence of ischemia, EF 41% (Ef 55-60% by echo same time).  . Carotid artery disease ([place])    carotid Doppler course left side 5069% stenosis  . Dysrhythmia    AFib  . Habitual alcohol use   . Hyperlipidemia   . Hypertension   . Low back pain   . OSA on CPAP   . Osteoarthritis   . Polysubstance abuse ([place])    use including marijuana and vicodin,which he obtained from the street  .  PVC's (premature ventricular contractions)    status post Holter monitor normal sinus rhythm otherwise asymptomatic PVCs.  . Seasonal allergies     Past Surgical History:  Procedure Laterality Date  . CARDIOVERSION N/A 11/19/2017   Procedure: CARDIOVERSION;  Surgeon: Herminio Commons, MD;  [facility];  Service: Cardiovascular;  Laterality: N/A;  . CORONARY ANGIOPLASTY WITH STENT PLACEMENT  2011  . CORONARY ARTERY BYPASS GRAFT N/A 03/10/2019   Procedure: CORONARY ARTERY BYPASS GRAFTING (CABG)X 3 Using Left Internal Mammary Artery and Right Saphenous Vein Grafts;  Surgeon: Ivin Poot, MD;  Location: [facility];  Service: Open Heart Surgery;  Laterality: N/A;  . LEFT HEART CATH AND CORONARY ANGIOGRAPHY N/A 03/08/2019   Procedure: LEFT HEART CATH AND CORONARY ANGIOGRAPHY;  Surgeon: Belva Crome, MD;  Location: [facility];  Service: Cardiovascular;  Laterality: N/A;  . TEE WITHOUT CARDIOVERSION N/A 03/10/2019   Procedure: TRANSESOPHAGEAL ECHOCARDIOGRAM (TEE);  Surgeon: Prescott Gum, Collier Salina, MD;  Location: [facility];  Service: Open Heart Surgery;  Laterality: N/A;  . TONSILLECTOMY      Family History  Problem Relation Age of Onset  . Stroke Mother   . Other Mother        small bowel obstruction  . Heart attack Father   . Heart attack Brother   . Healthy Son     Social History Social History   Tobacco Use  . Smoking status: Former Smoker  Packs/day: 1.00    Years: 35.00    Pack years: 35.00    Types: Cigarettes    Start date: 09/18/1967    Quit date: 06/09/1999    Years since quitting: 19.9  . Smokeless tobacco: Never Used  Substance Use Topics  . Alcohol use: Yes    Alcohol/week: 42.0 standard drinks    Types: 42 Shots of liquor per week    Comment: 1/2 gallon og liquor per week  . Drug use: Yes    Types: Marijuana    Comment: smokes marijuana occassionally-last smoked few weeks ago    Current Outpatient Medications  Medication Sig Dispense Refill  .  acetaminophen (TYLENOL) 325 MG tablet Take 1-2 tablets (325-650 mg total) by mouth every 4 (four) hours as needed for mild pain.    Marland Kitchen allopurinol (ZYLOPRIM) 300 MG tablet Take 300 mg by mouth every morning.     . Amino Acids-Protein Hydrolys (FEEDING SUPPLEMENT, PRO-STAT SUGAR FREE 64,) LIQD Take 30 mLs by mouth 2 (two) times daily. 887 mL 0  . amiodarone (PACERONE) 200 MG tablet Take 1 tablet (200 mg total) by mouth daily. 90 tablet 3  . apixaban (ELIQUIS) 5 MG TABS tablet Take 1 tablet (5 mg total) by mouth 2 (two) times daily. 180 tablet 1  . aspirin EC 81 MG EC tablet Take 1 tablet (81 mg total) by mouth daily. 100 tablet 1  . atorvastatin (LIPITOR) 80 MG tablet Take 1 tablet (80 mg total) by mouth daily. (Patient taking differently: Take 80 mg by mouth every evening. ) 90 tablet 2  . Cholecalciferol (VITAMIN D-3) 1000 UNITS CAPS Take 1,000 Units by mouth every morning.     . citalopram (CELEXA) 10 MG tablet TAKE 1 TABLET BY MOUTH EVERY DAY 90 tablet 1  . colchicine 0.6 MG tablet TAKE 1 TABLET (0.6 MG TOTAL) BY MOUTH DAILY AT 6 PM. 30 tablet 0  . Cyanocobalamin (VITAMIN B-12) 2500 MCG SUBL Place 2,500 mcg under the tongue every morning.     . diclofenac sodium (VOLTAREN) 1 % GEL Apply 2 g topically 4 (four) times daily. 350 g 1  . docusate sodium (COLACE) 100 MG capsule Take 2 capsules (200 mg total) by mouth daily. 60 capsule 0  . fluticasone (FLONASE) 50 MCG/ACT nasal spray Place 2 sprays into the nose daily.      . folic acid (FOLVITE) 1 MG tablet TAKE ONE TABLET BY MOUTH EVERY DAY (Patient taking differently: Take 1 mg by mouth every morning. ) 30 tablet 6  . furosemide (LASIX) 20 MG tablet Take 1 tablet (20 mg total) by mouth 2 (two) times daily. 180 tablet 3  . guaiFENesin (MUCINEX) 600 MG 12 hr tablet Take 1 tablet (600 mg total) by mouth 2 (two) times daily as needed for cough. 24 tablet 0  . loratadine (CLARITIN) 10 MG tablet Take 10 mg by mouth every morning.     Marland Kitchen LORazepam (ATIVAN)  0.5 MG tablet Take 0.5 mg by mouth 2 (two) times daily as needed.    . meclizine (ANTIVERT) 32 MG tablet Take 32 mg by mouth daily as needed for dizziness.     . metoprolol tartrate (LOPRESSOR) 25 MG tablet Take 1 tablet (25 mg total) by mouth every morning. 90 tablet 3  . mometasone-formoterol (DULERA) 200-5 MCG/ACT AERO Inhale 2 puffs into the lungs 2 (two) times daily. 13 g 0  . nitroGLYCERIN (NITROSTAT) 0.4 MG SL tablet PLACE 1 TABLET (0.4 MG TOTAL) UNDER THE TONGUE EVERY  5 (FIVE) MINUTES AS NEEDED. (Patient taking differently: Place 0.4 mg under the tongue every 5 (five) minutes as needed for chest pain. ) 25 tablet 3  . pantoprazole (PROTONIX) 40 MG tablet Take 1 tablet (40 mg total) by mouth daily. 30 tablet 0  . polyethylene glycol (MIRALAX / GLYCOLAX) packet Take 17 g by mouth 3 (three) times a week.     . potassium chloride SA (KLOR-CON M15) 15 MEQ tablet Take 2 tablets (30 mEq total) by mouth daily. 180 tablet 3  . predniSONE (DELTASONE) 10 MG tablet Take 1 tablet (10 mg total) by mouth daily with breakfast. For 3 days, then reduce to 5 mg daily for 3 days 5 tablet 0  . tamsulosin (FLOMAX) 0.4 MG CAPS capsule Take 1 capsule (0.4 mg total) by mouth 2 (two) times daily. 60 capsule 0  . thiamine 100 MG tablet Take 1 tablet (100 mg total) by mouth daily. 30 tablet 0  . traZODone (DESYREL) 100 MG tablet TAKE 1 TABLET (100 MG TOTAL) BY MOUTH AT BEDTIME AS NEEDED FOR SLEEP. 90 tablet 1   No current facility-administered medications for this visit.    Allergies  Allergen Reactions  . Penicillins Hives and Rash    Has patient had a PCN reaction causing immediate rash, facial/tongue/throat swelling, SOB or lightheadedness with hypotension: Unknown Has patient had a PCN reaction causing severe rash involving mucus membranes or skin necrosis: Unknown Has patient had a PCN reaction that required hospitalization: No Has patient had a PCN reaction occurring within the last 10 years: No If all of  the above answers are "NO", then may proceed with Cephalosporin use.      Review of Systems   Generally improved, using a walker at home with improved strength of his left leg No fever No bleeding problems from Eliquis  BP (!) 196/100   Pulse 74   Temp 98.1 F (36.7 C)   Resp 20   Ht 5\' 8"  (1.727 m)   Wt 255 lb (115.7 kg)   SpO2 90% Comment: RA  BMI 38.77 kg/m  Physical Exam      Exam    General- alert and comfortable    Neck- no JVD, no cervical adenopathy palpable, no carotid bruit   Lungs- clear without rales, wheezes   Cor- regular rate and rhythm, no murmur , gallop   Abdomen- soft, non-tender   Extremities - warm, non-tender, minimal edema   Neuro- oriented, appropriate, no focal weakness   Diagnostic Tests: Chest x-ray personally reviewed and is clear  Impression: Progressing with overall strength and ambulation as he regains function in his left leg  Plan: Continue current medications.  Patient encouraged to ambulate 5 minutes 3 times a day using his rolling walker at home.  We talked about heart healthy diet and compliance with meds.  I will see him back in follow-up in 8 weeks to review progress.  He is not ready to drive at this point and he understands that.   Len Childs, MD [facility] [phone]

## 2019-05-27 ENCOUNTER — Other Ambulatory Visit: Payer: Self-pay | Admitting: Cardiovascular Disease

## 2019-05-29 DIAGNOSIS — I4891 Unspecified atrial fibrillation: Secondary | ICD-10-CM | POA: Diagnosis not present

## 2019-05-29 DIAGNOSIS — I69354 Hemiplegia and hemiparesis following cerebral infarction affecting left non-dominant side: Secondary | ICD-10-CM | POA: Diagnosis not present

## 2019-05-29 DIAGNOSIS — M1990 Unspecified osteoarthritis, unspecified site: Secondary | ICD-10-CM | POA: Diagnosis not present

## 2019-05-29 DIAGNOSIS — I10 Essential (primary) hypertension: Secondary | ICD-10-CM | POA: Diagnosis not present

## 2019-05-30 ENCOUNTER — Ambulatory Visit (HOSPITAL_COMMUNITY)
Admission: RE | Admit: 2019-05-30 | Discharge: 2019-05-30 | Disposition: A | Discharge status: Home / Self Care | Payer: Medicare Other | Source: Ambulatory Visit | Attending: Internal Medicine | Admitting: Internal Medicine

## 2019-05-30 ENCOUNTER — Other Ambulatory Visit: Payer: Self-pay

## 2019-05-30 DIAGNOSIS — R42 Dizziness and giddiness: Secondary | ICD-10-CM | POA: Diagnosis not present

## 2019-05-30 DIAGNOSIS — I2510 Atherosclerotic heart disease of native coronary artery without angina pectoris: Secondary | ICD-10-CM | POA: Diagnosis not present

## 2019-05-30 DIAGNOSIS — M1991 Primary osteoarthritis, unspecified site: Secondary | ICD-10-CM | POA: Diagnosis not present

## 2019-05-30 DIAGNOSIS — Z7982 Long term (current) use of aspirin: Secondary | ICD-10-CM | POA: Diagnosis not present

## 2019-05-30 DIAGNOSIS — G4700 Insomnia, unspecified: Secondary | ICD-10-CM | POA: Diagnosis not present

## 2019-05-30 DIAGNOSIS — I4819 Other persistent atrial fibrillation: Secondary | ICD-10-CM | POA: Diagnosis not present

## 2019-05-30 DIAGNOSIS — Z7901 Long term (current) use of anticoagulants: Secondary | ICD-10-CM | POA: Diagnosis not present

## 2019-05-30 DIAGNOSIS — G319 Degenerative disease of nervous system, unspecified: Secondary | ICD-10-CM | POA: Insufficient documentation

## 2019-05-30 DIAGNOSIS — R296 Repeated falls: Secondary | ICD-10-CM | POA: Diagnosis not present

## 2019-05-30 DIAGNOSIS — I252 Old myocardial infarction: Secondary | ICD-10-CM | POA: Diagnosis not present

## 2019-05-30 DIAGNOSIS — S0990XA Unspecified injury of head, initial encounter: Secondary | ICD-10-CM | POA: Diagnosis not present

## 2019-05-30 DIAGNOSIS — E785 Hyperlipidemia, unspecified: Secondary | ICD-10-CM | POA: Diagnosis not present

## 2019-05-30 DIAGNOSIS — W19XXXA Unspecified fall, initial encounter: Secondary | ICD-10-CM | POA: Diagnosis not present

## 2019-05-30 DIAGNOSIS — Z9181 History of falling: Secondary | ICD-10-CM | POA: Diagnosis not present

## 2019-05-30 DIAGNOSIS — R339 Retention of urine, unspecified: Secondary | ICD-10-CM | POA: Diagnosis not present

## 2019-05-30 DIAGNOSIS — S0093XA Contusion of unspecified part of head, initial encounter: Secondary | ICD-10-CM | POA: Insufficient documentation

## 2019-05-30 DIAGNOSIS — Z951 Presence of aortocoronary bypass graft: Secondary | ICD-10-CM | POA: Diagnosis not present

## 2019-05-30 DIAGNOSIS — Z8673 Personal history of transient ischemic attack (TIA), and cerebral infarction without residual deficits: Secondary | ICD-10-CM | POA: Diagnosis not present

## 2019-05-30 DIAGNOSIS — I69354 Hemiplegia and hemiparesis following cerebral infarction affecting left non-dominant side: Secondary | ICD-10-CM | POA: Diagnosis not present

## 2019-05-30 DIAGNOSIS — Z48812 Encounter for surgical aftercare following surgery on the circulatory system: Secondary | ICD-10-CM | POA: Diagnosis not present

## 2019-05-30 DIAGNOSIS — E46 Unspecified protein-calorie malnutrition: Secondary | ICD-10-CM | POA: Diagnosis not present

## 2019-05-30 DIAGNOSIS — I110 Hypertensive heart disease with heart failure: Secondary | ICD-10-CM | POA: Diagnosis not present

## 2019-05-30 DIAGNOSIS — I5030 Unspecified diastolic (congestive) heart failure: Secondary | ICD-10-CM | POA: Diagnosis not present

## 2019-05-30 DIAGNOSIS — Z87891 Personal history of nicotine dependence: Secondary | ICD-10-CM | POA: Diagnosis not present

## 2019-05-30 DIAGNOSIS — G4733 Obstructive sleep apnea (adult) (pediatric): Secondary | ICD-10-CM | POA: Diagnosis not present

## 2019-05-30 DIAGNOSIS — M109 Gout, unspecified: Secondary | ICD-10-CM | POA: Diagnosis not present

## 2019-05-30 DIAGNOSIS — Z86718 Personal history of other venous thrombosis and embolism: Secondary | ICD-10-CM | POA: Diagnosis not present

## 2019-05-31 ENCOUNTER — Encounter
Payer: Medicare Other | Attending: Physical Medicine and Rehabilitation | Admitting: Physical Medicine and Rehabilitation

## 2019-05-31 DIAGNOSIS — Z48812 Encounter for surgical aftercare following surgery on the circulatory system: Secondary | ICD-10-CM | POA: Diagnosis not present

## 2019-05-31 DIAGNOSIS — Z9181 History of falling: Secondary | ICD-10-CM | POA: Diagnosis not present

## 2019-05-31 DIAGNOSIS — I252 Old myocardial infarction: Secondary | ICD-10-CM | POA: Diagnosis not present

## 2019-05-31 DIAGNOSIS — R339 Retention of urine, unspecified: Secondary | ICD-10-CM | POA: Diagnosis not present

## 2019-05-31 DIAGNOSIS — I69398 Other sequelae of cerebral infarction: Secondary | ICD-10-CM | POA: Diagnosis not present

## 2019-05-31 DIAGNOSIS — R2689 Other abnormalities of gait and mobility: Secondary | ICD-10-CM | POA: Diagnosis not present

## 2019-05-31 DIAGNOSIS — I63421 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: Secondary | ICD-10-CM

## 2019-05-31 DIAGNOSIS — I63521 Cerebral infarction due to unspecified occlusion or stenosis of right anterior cerebral artery: Secondary | ICD-10-CM | POA: Insufficient documentation

## 2019-05-31 DIAGNOSIS — M109 Gout, unspecified: Secondary | ICD-10-CM | POA: Diagnosis not present

## 2019-05-31 DIAGNOSIS — Z7901 Long term (current) use of anticoagulants: Secondary | ICD-10-CM

## 2019-05-31 DIAGNOSIS — Z951 Presence of aortocoronary bypass graft: Secondary | ICD-10-CM | POA: Diagnosis not present

## 2019-05-31 DIAGNOSIS — I4819 Other persistent atrial fibrillation: Secondary | ICD-10-CM | POA: Diagnosis not present

## 2019-05-31 DIAGNOSIS — I69354 Hemiplegia and hemiparesis following cerebral infarction affecting left non-dominant side: Secondary | ICD-10-CM | POA: Diagnosis not present

## 2019-05-31 DIAGNOSIS — I5030 Unspecified diastolic (congestive) heart failure: Secondary | ICD-10-CM | POA: Diagnosis not present

## 2019-05-31 DIAGNOSIS — G4700 Insomnia, unspecified: Secondary | ICD-10-CM | POA: Diagnosis not present

## 2019-05-31 DIAGNOSIS — G4733 Obstructive sleep apnea (adult) (pediatric): Secondary | ICD-10-CM | POA: Diagnosis not present

## 2019-05-31 DIAGNOSIS — Z86718 Personal history of other venous thrombosis and embolism: Secondary | ICD-10-CM | POA: Diagnosis not present

## 2019-05-31 DIAGNOSIS — M1991 Primary osteoarthritis, unspecified site: Secondary | ICD-10-CM | POA: Diagnosis not present

## 2019-05-31 DIAGNOSIS — Z7982 Long term (current) use of aspirin: Secondary | ICD-10-CM | POA: Diagnosis not present

## 2019-05-31 DIAGNOSIS — E785 Hyperlipidemia, unspecified: Secondary | ICD-10-CM | POA: Diagnosis not present

## 2019-05-31 DIAGNOSIS — I110 Hypertensive heart disease with heart failure: Secondary | ICD-10-CM | POA: Diagnosis not present

## 2019-05-31 DIAGNOSIS — E46 Unspecified protein-calorie malnutrition: Secondary | ICD-10-CM | POA: Diagnosis not present

## 2019-05-31 DIAGNOSIS — Z87891 Personal history of nicotine dependence: Secondary | ICD-10-CM | POA: Diagnosis not present

## 2019-05-31 DIAGNOSIS — I2510 Atherosclerotic heart disease of native coronary artery without angina pectoris: Secondary | ICD-10-CM | POA: Diagnosis not present

## 2019-05-31 NOTE — Progress Notes (Signed)
Subjective:    Patient ID: Martin [name], male    DOB: 25-Aug-1955, 63 y.o.   MRN: [id number]  HPI  Mr. Sailors presents for hospital follow-up following right ACA stroke.   He has no complaints this visit. Denies constipation. He is sleeping well at night. He does not require medication renewal at this time. He had outpatient follow-up with neurology on 11/6 and received stroke prevention medication/instruction. He is currently getting home PT.  He has been ambulating with PT. 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 It is laborious but he feels confident that he knows how to adjust back to work. He will be transitioning to outpatient PT after 2 more home visits; will be remote therapy.   Pain Inventory Average Pain 0 Pain Right Now 0 My pain is no pain  In the last 24 hours, has pain interfered with the following? General activity 0 Relation with others 0 Enjoyment of life 0 What TIME of day is your pain at its worst? no pain Sleep (in general) Good  Pain is worse with: no pain Pain improves with: no pain Relief from Meds: no pain  Mobility walk with assistance use a walker  Function disabled: date disabled temporary disablity  Neuro/Psych trouble walking anxiety  Prior Studies Any changes since last visit?  no  Physicians involved in your care Any changes since last visit?  no   Family History  Problem Relation Age of Onset  . Stroke Mother   . Other Mother        small bowel obstruction  . Heart attack Father   . Heart attack Brother   . Healthy Son    Social History   Socioeconomic History  . Marital status: Married    Spouse name: Not on file  . Number of children: 1  . Years of education: Not on file  . Highest education level: 9th grade  Occupational History    Employer: UNEMPLOYED    Comment: works Social worker employed  Tobacco Use  . Smoking status: Former Smoker    Packs/day: 1.00   Years: 35.00    Pack years: 35.00    Types: Cigarettes    Start date: 09/18/1967    Quit date: 06/09/1999    Years since quitting: 19.9  . Smokeless tobacco: Never Used  Substance and Sexual Activity  . Alcohol use: Yes    Alcohol/week: 42.0 standard drinks    Types: 42 Shots of liquor per week    Comment: 1/2 gallon og liquor per week  . Drug use: Yes    Types: Marijuana    Comment: smokes marijuana occassionally-last smoked few weeks ago  . Sexual activity: Yes    Birth control/protection: None  Other Topics Concern  . Not on file  Social History Narrative   No exercise   Pt lives with spouse in 1 story home   Right handed   Drinks coffee every morning, not tea, no soda   One son high level of education is 9th grade   Social Determinants of Health   Financial Resource Strain:   . Difficulty of Paying Living Expenses: Not on file  Food Insecurity:   . Worried About Charity fundraiser in the Last Year: Not on file  . Ran Out of Food in the Last Year: Not on file  Transportation Needs:   . Lack of Transportation (Medical): Not on file  . Lack of Transportation (Non-Medical):  Not on file  Physical Activity:   . Days of Exercise per Week: Not on file  . Minutes of Exercise per Session: Not on file  Stress:   . Feeling of Stress : Not on file  Social Connections:   . Frequency of Communication with Friends and Family: Not on file  . Frequency of Social Gatherings with Friends and Family: Not on file  . Attends Religious Services: Not on file  . Active Member of Clubs or Organizations: Not on file  . Attends Archivist Meetings: Not on file  . Marital Status: Not on file   Past Surgical History:  Procedure Laterality Date  . CARDIOVERSION N/A 11/19/2017   Procedure: CARDIOVERSION;  Surgeon: Herminio Commons, MD;  [facility];  Service: Cardiovascular;  Laterality: N/A;  . CORONARY ANGIOPLASTY WITH STENT PLACEMENT  2011  . CORONARY ARTERY BYPASS  GRAFT N/A 03/10/2019   Procedure: CORONARY ARTERY BYPASS GRAFTING (CABG)X 3 Using Left Internal Mammary Artery and Right Saphenous Vein Grafts;  Surgeon: Ivin Poot, MD;  Location: [facility];  Service: Open Heart Surgery;  Laterality: N/A;  . LEFT HEART CATH AND CORONARY ANGIOGRAPHY N/A 03/08/2019   Procedure: LEFT HEART CATH AND CORONARY ANGIOGRAPHY;  Surgeon: Belva Crome, MD;  Location: [place] [facility];  Service: Cardiovascular;  Laterality: N/A;  . TEE WITHOUT CARDIOVERSION N/A 03/10/2019   Procedure: TRANSESOPHAGEAL ECHOCARDIOGRAM (TEE);  Surgeon: Prescott Gum, Collier Salina, MD;  Location: [facility];  Service: Open Heart Surgery;  Laterality: N/A;  . TONSILLECTOMY     Past Medical History:  Diagnosis Date  . Atrial fibrillation ([place])    a. dx 10/2017. b. s/p DCCV on 11/19/2017 with successful conversion to NSR but back in AFIB 3 weeks later  . CAD (coronary artery disease)    a. s/p PCI to LAD 2001. b. s/p stenting to the RCA and mid LAD December 2011, residual distal RCA disease treated medically. b. nuc 02/2013 abnormal with fixed defect seen in inferoapical, mid-inferior, and basal inferior walls, indicative of myocardial scar, no evidence of ischemia, EF 41% (Ef 55-60% by echo same time).  . Carotid artery disease ([place])    carotid Doppler course left side 5069% stenosis  . Dysrhythmia    AFib  . Habitual alcohol use   . Hyperlipidemia   . Hypertension   . Low back pain   . OSA on CPAP   . Osteoarthritis   . Polysubstance abuse ([place])    use including marijuana and vicodin,which he obtained from the street  . PVC's (premature ventricular contractions)    status post Holter monitor normal sinus rhythm otherwise asymptomatic PVCs.  . Seasonal allergies    There were no vitals taken for this visit.  Opioid Risk Score:   Fall Risk Score:  `1  Depression screen PHQ 2/9  Depression screen [facility] 2/9 05/31/2019 04/24/2019  Decreased Interest 0 0  Down, Depressed, Hopeless 0 0  PHQ - 2 Score 0  0  Altered sleeping - 1  Tired, decreased energy - 1  Change in appetite - 0  Feeling bad or failure about yourself  - 0  Trouble concentrating - 0  Moving slowly or fidgety/restless - 0  Suicidal thoughts - 0  PHQ-9 Score - 2  Difficult doing work/chores - Not difficult at all    Review of Systems  Constitutional: Negative.   HENT: Negative.   Eyes: Negative.   Respiratory: Negative.   Cardiovascular: Negative.   Gastrointestinal: Negative.  Endocrine: Negative.   Genitourinary: Negative.   Musculoskeletal: Positive for gait problem.  Skin: Negative.   Allergic/Immunologic: Negative.   Hematological: Bruises/bleeds easily.       On eliquis  Psychiatric/Behavioral: The patient is nervous/anxious.   All other systems reviewed and are negative.      Objective:   Physical Exam   Visit was by phone (webex did not work)     Assessment & Plan:  Mr. Demott presents for hospital follow-up following right ACA stroke.   --Pain well controlled, no constipation, sleeping well, progressing with therapy, does not require medication renewals this visit. Followed up with neurology on 11/6 and received stroke education and medications.  --Continue Home PT with eventual transition to outpatient PT after 3 more home visits.  --RTC PRN

## 2019-06-05 ENCOUNTER — Other Ambulatory Visit: Payer: Self-pay | Admitting: Physician Assistant

## 2019-06-05 NOTE — Telephone Encounter (Signed)
Eliquis 5mg  refill request received, pt is 63yrs old, weight-115.7kg, Crea-0.79 on 04/10/2019, Diagnosis-Afib, and last seen by Dr. Bronson Ing on 05/02/2019. Dose is appropriate based on dosing criteria. Will send in refill to requested pharmacy.

## 2019-06-07 DIAGNOSIS — I5030 Unspecified diastolic (congestive) heart failure: Secondary | ICD-10-CM | POA: Diagnosis not present

## 2019-06-07 DIAGNOSIS — I69354 Hemiplegia and hemiparesis following cerebral infarction affecting left non-dominant side: Secondary | ICD-10-CM | POA: Diagnosis not present

## 2019-06-07 DIAGNOSIS — Z6839 Body mass index (BMI) 39.0-39.9, adult: Secondary | ICD-10-CM

## 2019-06-07 DIAGNOSIS — E46 Unspecified protein-calorie malnutrition: Secondary | ICD-10-CM | POA: Diagnosis not present

## 2019-06-07 DIAGNOSIS — Z48812 Encounter for surgical aftercare following surgery on the circulatory system: Secondary | ICD-10-CM | POA: Diagnosis not present

## 2019-06-07 DIAGNOSIS — I4819 Other persistent atrial fibrillation: Secondary | ICD-10-CM | POA: Diagnosis not present

## 2019-06-07 DIAGNOSIS — R339 Retention of urine, unspecified: Secondary | ICD-10-CM | POA: Diagnosis not present

## 2019-06-07 DIAGNOSIS — M109 Gout, unspecified: Secondary | ICD-10-CM

## 2019-06-07 DIAGNOSIS — I110 Hypertensive heart disease with heart failure: Secondary | ICD-10-CM

## 2019-06-07 DIAGNOSIS — Z7982 Long term (current) use of aspirin: Secondary | ICD-10-CM | POA: Diagnosis not present

## 2019-06-07 DIAGNOSIS — Z9181 History of falling: Secondary | ICD-10-CM | POA: Diagnosis not present

## 2019-06-07 DIAGNOSIS — I2510 Atherosclerotic heart disease of native coronary artery without angina pectoris: Secondary | ICD-10-CM | POA: Diagnosis not present

## 2019-06-07 DIAGNOSIS — Z87891 Personal history of nicotine dependence: Secondary | ICD-10-CM | POA: Diagnosis not present

## 2019-06-07 DIAGNOSIS — Z7901 Long term (current) use of anticoagulants: Secondary | ICD-10-CM | POA: Diagnosis not present

## 2019-06-07 DIAGNOSIS — Z951 Presence of aortocoronary bypass graft: Secondary | ICD-10-CM | POA: Diagnosis not present

## 2019-06-07 DIAGNOSIS — E785 Hyperlipidemia, unspecified: Secondary | ICD-10-CM | POA: Diagnosis not present

## 2019-06-07 DIAGNOSIS — I252 Old myocardial infarction: Secondary | ICD-10-CM | POA: Diagnosis not present

## 2019-06-07 DIAGNOSIS — G4733 Obstructive sleep apnea (adult) (pediatric): Secondary | ICD-10-CM | POA: Diagnosis not present

## 2019-06-07 DIAGNOSIS — F1920 Other psychoactive substance dependence, uncomplicated: Secondary | ICD-10-CM

## 2019-06-07 DIAGNOSIS — Z86718 Personal history of other venous thrombosis and embolism: Secondary | ICD-10-CM | POA: Diagnosis not present

## 2019-06-07 DIAGNOSIS — M1991 Primary osteoarthritis, unspecified site: Secondary | ICD-10-CM | POA: Diagnosis not present

## 2019-06-07 DIAGNOSIS — G4700 Insomnia, unspecified: Secondary | ICD-10-CM | POA: Diagnosis not present

## 2019-06-10 ENCOUNTER — Other Ambulatory Visit: Payer: Self-pay | Admitting: Physical Medicine and Rehabilitation

## 2019-06-10 DIAGNOSIS — R5381 Other malaise: Secondary | ICD-10-CM | POA: Diagnosis not present

## 2019-06-12 DIAGNOSIS — Z7409 Other reduced mobility: Secondary | ICD-10-CM | POA: Diagnosis not present

## 2019-06-12 DIAGNOSIS — I63331 Cerebral infarction due to thrombosis of right posterior cerebral artery: Secondary | ICD-10-CM | POA: Diagnosis not present

## 2019-06-12 DIAGNOSIS — I69354 Hemiplegia and hemiparesis following cerebral infarction affecting left non-dominant side: Secondary | ICD-10-CM | POA: Diagnosis not present

## 2019-06-12 DIAGNOSIS — Z951 Presence of aortocoronary bypass graft: Secondary | ICD-10-CM | POA: Diagnosis not present

## 2019-06-12 NOTE — Progress Notes (Signed)
NEUROLOGY FOLLOW UP OFFICE NOTE  KUNJ SAFRIT [id number]  HISTORY OF PRESENT ILLNESS: Martin [name] is a 64 year old right-handed Caucasian male with atrial fibrillation, CAD, HTN, HLD, and OSA on CPAP with recent stroke who follows up at request of PCP to evaluate for other causes of falls.  UPDATE: He finished home PT and will be starting PT at [facility].  He feels a little shaky but walking is "pretty good".  Left leg has gotten stronger.  Sometimes he says He has known neuropathy in the feet but no new or worsening numbness.  No radicular pain down the legs.  Since hospital discharge, he has had two subsequent syncopal episodes that occurred while holding his breath.  He reports that he passed out about a month ago.  He was walking down the hallway and felt lightheadedness, palpitations and tunnel vision.  He felt like he was going to pass out.  It occurred while he was holding his breath.  He doesn't know why.  He started to sit down but lost consciousness for a few seconds and woke up on the floor.  His wife heard him fall and when she came around, she found him on the floor.  No reported seizure activity.  He noted urinary incontinence.  Did not bite his tongue.  No spells since a month ago.  HISTORY: He was admitted to [facility] on 03/07/2019 for non-STEMI.  He underwent CABG on 03/10/2019.  He had episode of hypotension (SBP 70s-90s) on 03/12/2019.  On 03/13/2019, he was noted to have left arm and leg weakness.  CT of head showed right distal ACA territory infarct.  MRI unable to be performed due to recent CABG. REPEAT CT OF THE HEAD THE FOLLOWING DAY SHOWED STABLE SMALL RIGHT ACA INFARCT.  CTA of head and neck showed no emergent large  vessel occlusion but there was a new partially clipped calcified clot in the right ACA A1.  Carotid Doppler showed 1 to 39% stenosis in the bilateral internal carotid arteries.  TEE showed ejection fraction 55 to 60% with no cardiac source of  embolus.  Transcranial Doppler showed no proximal intracranial large vessel stenosis.  LDL was 85.  Hemoglobin A1c was 5.6.  He was on Eliquis prior to admission.  As per stroke protocol, he was placed on IV heparin with instructions to transition back to Eliquis.  He was continued on Lipitor 80mg  daily.  During hospitalization, he had an acute episode of confusion.  EEG was normal.  He was discharged to inpatient rehab.  PAST MEDICAL HISTORY: Past Medical History:  Diagnosis Date  . Atrial fibrillation ([place])    a. dx 10/2017. b. s/p DCCV on 11/19/2017 with successful conversion to NSR but back in AFIB 3 weeks later  . CAD (coronary artery disease)    a. s/p PCI to LAD 2001. b. s/p stenting to the RCA and mid LAD December 2011, residual distal RCA disease treated medically. b. nuc 02/2013 abnormal with fixed defect seen in inferoapical, mid-inferior, and basal inferior walls, indicative of myocardial scar, no evidence of ischemia, EF 41% (Ef 55-60% by echo same time).  . Carotid artery disease ([place])    carotid Doppler course left side 5069% stenosis  . Dysrhythmia    AFib  . Habitual alcohol use   . Hyperlipidemia   . Hypertension   . Low back pain   . OSA on CPAP   . Osteoarthritis   . Polysubstance abuse ([place])  use including marijuana and vicodin,which he obtained from the street  . PVC's (premature ventricular contractions)    status post Holter monitor normal sinus rhythm otherwise asymptomatic PVCs.  . Seasonal allergies     MEDICATIONS: Current Outpatient Medications on File Prior to Visit  Medication Sig Dispense Refill  . acetaminophen (TYLENOL) 325 MG tablet Take 1-2 tablets (325-650 mg total) by mouth every 4 (four) hours as needed for mild pain.    Marland Kitchen allopurinol (ZYLOPRIM) 300 MG tablet Take 300 mg by mouth every morning.     . Amino Acids-Protein Hydrolys (FEEDING SUPPLEMENT, PRO-STAT SUGAR FREE 64,) LIQD Take 30 mLs by mouth 2 (two) times daily. 887 mL 0  . amiodarone  (PACERONE) 200 MG tablet Take 1 tablet (200 mg total) by mouth daily. 90 tablet 3  . aspirin EC 81 MG EC tablet Take 1 tablet (81 mg total) by mouth daily. 100 tablet 1  . atorvastatin (LIPITOR) 80 MG tablet Take 1 tablet (80 mg total) by mouth daily. (Patient taking differently: Take 80 mg by mouth every evening. ) 90 tablet 2  . Cholecalciferol (VITAMIN D-3) 1000 UNITS CAPS Take 1,000 Units by mouth every morning.     . citalopram (CELEXA) 10 MG tablet TAKE 1 TABLET BY MOUTH EVERY DAY 90 tablet 1  . colchicine 0.6 MG tablet TAKE 1 TABLET (0.6 MG TOTAL) BY MOUTH DAILY AT 6 PM. 30 tablet 0  . Cyanocobalamin (VITAMIN B-12) 2500 MCG SUBL Place 2,500 mcg under the tongue every morning.     . diclofenac sodium (VOLTAREN) 1 % GEL Apply 2 g topically 4 (four) times daily. 350 g 1  . docusate sodium (COLACE) 100 MG capsule Take 2 capsules (200 mg total) by mouth daily. 60 capsule 0  . ELIQUIS 5 MG TABS tablet TAKE 1 TABLET BY MOUTH TWICE A DAY 180 tablet 2  . fluticasone (FLONASE) 50 MCG/ACT nasal spray Place 2 sprays into the nose daily.      . folic acid (FOLVITE) 1 MG tablet TAKE ONE TABLET BY MOUTH EVERY DAY (Patient taking differently: Take 1 mg by mouth every morning. ) 30 tablet 6  . furosemide (LASIX) 20 MG tablet Take 1 tablet (20 mg total) by mouth 2 (two) times daily. 180 tablet 3  . loratadine (CLARITIN) 10 MG tablet Take 10 mg by mouth every morning.     Marland Kitchen LORazepam (ATIVAN) 0.5 MG tablet Take 0.5 mg by mouth 2 (two) times daily as needed.    . meclizine (ANTIVERT) 32 MG tablet Take 32 mg by mouth daily as needed for dizziness.     . metoprolol tartrate (LOPRESSOR) 25 MG tablet Take 1 tablet (25 mg total) by mouth every morning. 90 tablet 3  . mometasone-formoterol (DULERA) 200-5 MCG/ACT AERO Inhale 2 puffs into the lungs 2 (two) times daily. (Patient not taking: Reported on 05/31/2019) 13 g 0  . nitroGLYCERIN (NITROSTAT) 0.4 MG SL tablet PLACE 1 TABLET (0.4 MG TOTAL) UNDER THE TONGUE EVERY 5  (FIVE) MINUTES AS NEEDED. (Patient taking differently: Place 0.4 mg under the tongue every 5 (five) minutes as needed for chest pain. ) 25 tablet 3  . pantoprazole (PROTONIX) 40 MG tablet Take 1 tablet (40 mg total) by mouth daily. 30 tablet 0  . polyethylene glycol (MIRALAX / GLYCOLAX) packet Take 17 g by mouth 3 (three) times a week.     . potassium chloride SA (KLOR-CON M15) 15 MEQ tablet Take 2 tablets (30 mEq total) by mouth daily. [place]  tablet 3  . tamsulosin (FLOMAX) 0.4 MG CAPS capsule Take 1 capsule (0.4 mg total) by mouth 2 (two) times daily. 60 capsule 0  . thiamine 100 MG tablet Take 1 tablet (100 mg total) by mouth daily. 30 tablet 0  . traZODone (DESYREL) 100 MG tablet TAKE 1 TABLET (100 MG TOTAL) BY MOUTH AT BEDTIME AS NEEDED FOR SLEEP. 90 tablet 1   No current facility-administered medications on file prior to visit.    ALLERGIES: Allergies  Allergen Reactions  . Penicillins Hives and Rash    Has patient had a PCN reaction causing immediate rash, facial/tongue/throat swelling, SOB or lightheadedness with hypotension: Unknown Has patient had a PCN reaction causing severe rash involving mucus membranes or skin necrosis: Unknown Has patient had a PCN reaction that required hospitalization: No Has patient had a PCN reaction occurring within the last 10 years: No If all of the above answers are "NO", then may proceed with Cephalosporin use.      FAMILY HISTORY: Family History  Problem Relation Age of Onset  . Stroke Mother   . Other Mother        small bowel obstruction  . Heart attack Father   . Heart attack Brother   . Healthy Son     SOCIAL HISTORY: Social History   Socioeconomic History  . Marital status: Married    Spouse name: Not on file  . Number of children: 1  . Years of education: Not on file  . Highest education level: 9th grade  Occupational History    Employer: UNEMPLOYED    Comment: works Social worker employed  Tobacco Use  . Smoking  status: Former Smoker    Packs/day: 1.00    Years: 35.00    Pack years: 35.00    Types: Cigarettes    Start date: 09/18/1967    Quit date: 06/09/1999    Years since quitting: 20.0  . Smokeless tobacco: Never Used  Substance and Sexual Activity  . Alcohol use: Yes    Alcohol/week: 42.0 standard drinks    Types: 42 Shots of liquor per week    Comment: 1/2 gallon og liquor per week  . Drug use: Yes    Types: Marijuana    Comment: smokes marijuana occassionally-last smoked few weeks ago  . Sexual activity: Yes    Birth control/protection: None  Other Topics Concern  . Not on file  Social History Narrative   No exercise   Pt lives with spouse in 1 story home   Right handed   Drinks coffee every morning, not tea, no soda   One son high level of education is 9th grade   Social Determinants of Health   Financial Resource Strain:   . Difficulty of Paying Living Expenses: Not on file  Food Insecurity:   . Worried About Charity fundraiser in the Last Year: Not on file  . Ran Out of Food in the Last Year: Not on file  Transportation Needs:   . Lack of Transportation (Medical): Not on file  . Lack of Transportation (Non-Medical): Not on file  Physical Activity:   . Days of Exercise per Week: Not on file  . Minutes of Exercise per Session: Not on file  Stress:   . Feeling of Stress : Not on file  Social Connections:   . Frequency of Communication with Friends and Family: Not on file  . Frequency of Social Gatherings with Friends and Family: Not on file  . Attends Religious Services: Not  on file  . Active Member of Clubs or Organizations: Not on file  . Attends Archivist Meetings: Not on file  . Marital Status: Not on file  Intimate Partner Violence:   . Fear of Current or Ex-Partner: Not on file  . Emotionally Abused: Not on file  . Physically Abused: Not on file  . Sexually Abused: Not on file    REVIEW OF SYSTEMS: Constitutional: No fevers, chills, or sweats, no  generalized fatigue, change in appetite Eyes: No visual changes, double vision, eye pain Ear, nose and throat: No hearing loss, ear pain, nasal congestion, sore throat Cardiovascular: No chest pain, palpitations Respiratory:  No shortness of breath at rest or with exertion, wheezes GastrointestinaI: No nausea, vomiting, diarrhea, abdominal pain, fecal incontinence Genitourinary:  No dysuria, urinary retention or frequency Musculoskeletal:  No neck pain, back pain Integumentary: No rash, pruritus, skin lesions Neurological: as above Psychiatric: No depression, insomnia, anxiety Endocrine: No palpitations, fatigue, diaphoresis, mood swings, change in appetite, change in weight, increased thirst Hematologic/Lymphatic:  No purpura, petechiae. Allergic/Immunologic: no itchy/runny eyes, nasal congestion, recent allergic reactions, rashes  PHYSICAL EXAM: Blood pressure (!) 170/110, pulse 74, height 5\' 8"  (1.727 m), weight 259 lb (117.5 kg), SpO2 97 %. General: No acute distress.  Patient appears well-groomed.   Head:  Normocephalic/atraumatic Eyes:  Fundi examined but not visualized Neck: supple, no paraspinal tenderness, full range of motion Heart:  Regular rate and rhythm Lungs:  Clear to auscultation bilaterally Back: No paraspinal tenderness Neurological Exam: alert and oriented to person, place, and time. Attention span and concentration intact, recent and remote memory intact, fund of knowledge intact.  Speech fluent and not dysarthric, language intact.  CN II-XII intact. Bulk and tone normal, 5-/5 left hand grip, otherwise muscle strength 5/5 throughout.  Sensation to light touch intact.  Deep tendon reflexes 2+ throughout.  Finger to nose and heel to shin testing intact.  Mildly unsteady gait, uses walker.  IMPRESSION: 1.  Recurrent loss of consciousness.  Endorses urinary incontinence, but otherwise symptoms seem more consistent with syncope rather than seizure.  He says these spells are  triggered with breath-holding.  I would like to evaluate further for possible seizure. 2.  Right posterior ACA territory infarct, cryptogenic.  Unclear if may be secondary to afib in setting of holding anticoagulation for CABG. 3.  Afib 4.  Hypertension, elevated today. 5.  Hyperlipidemia 6.  OSA on CPAP  PLAN: 1.  24 hour ambulatory EEG. 2.  Continue current management for secondary stroke prevention. 3.  Advised to repeat blood pressure check at home and to contact PCP or cardiology if still elevated. 4.  Discussed [place] law stating a person with loss of consciousness should be event-free for 6 months before resuming driving. 5.  Follow up in 4 months.  Metta Clines, DO  CC: Edwinna Areola. [place] Crane, MD

## 2019-06-13 ENCOUNTER — Encounter: Payer: Self-pay | Admitting: Neurology

## 2019-06-13 ENCOUNTER — Ambulatory Visit: Payer: Medicare Other | Admitting: Neurology

## 2019-06-13 ENCOUNTER — Other Ambulatory Visit: Payer: Self-pay

## 2019-06-13 VITALS — BP 170/110 | HR 74 | Ht 68.0 in | Wt 259.0 lb

## 2019-06-13 DIAGNOSIS — R55 Syncope and collapse: Secondary | ICD-10-CM

## 2019-06-13 DIAGNOSIS — I63421 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: Secondary | ICD-10-CM | POA: Diagnosis not present

## 2019-06-13 DIAGNOSIS — I4819 Other persistent atrial fibrillation: Secondary | ICD-10-CM

## 2019-06-13 DIAGNOSIS — G4733 Obstructive sleep apnea (adult) (pediatric): Secondary | ICD-10-CM

## 2019-06-13 DIAGNOSIS — I10 Essential (primary) hypertension: Secondary | ICD-10-CM | POA: Diagnosis not present

## 2019-06-13 NOTE — Patient Instructions (Signed)
1.  We will check a 24 hour ambulatory EEG.  2.  Follow up in 4 months

## 2019-06-15 ENCOUNTER — Other Ambulatory Visit: Payer: Self-pay

## 2019-06-15 DIAGNOSIS — R55 Syncope and collapse: Secondary | ICD-10-CM

## 2019-06-19 ENCOUNTER — Ambulatory Visit: Payer: Medicare Other | Admitting: Cardiovascular Disease

## 2019-06-19 ENCOUNTER — Other Ambulatory Visit: Payer: Self-pay

## 2019-06-19 ENCOUNTER — Ambulatory Visit (INDEPENDENT_AMBULATORY_CARE_PROVIDER_SITE_OTHER): Payer: Medicare Other | Admitting: Neurology

## 2019-06-19 DIAGNOSIS — R55 Syncope and collapse: Secondary | ICD-10-CM

## 2019-06-21 ENCOUNTER — Ambulatory Visit (HOSPITAL_COMMUNITY)
Admission: RE | Admit: 2019-06-21 | Discharge: 2019-06-21 | Disposition: A | Discharge status: Home / Self Care | Payer: Medicare Other | Source: Ambulatory Visit | Attending: Cardiology | Admitting: Cardiology

## 2019-06-21 ENCOUNTER — Other Ambulatory Visit: Payer: Self-pay

## 2019-06-21 NOTE — Progress Notes (Signed)
.         Confirm Consent - "In the setting of the current Covid19 crisis, you are scheduled for a phone visit with your Cardiac or Pulmonary team member on 06/20/2019 at 16:oo. Just as we do with many in-gym visits, in order for you to participate in this visit, we must obtain consent.  If you'd like, I can send this to your mychart (if signed up) or email for you to review.  Otherwise, I can obtain your verbal consent now.  By agreeing to a telephone visit, we'd like you to understand that the technology does not allow for your Cardiac or Pulmonary Rehab team member to perform a physical assessment, and thus may limit their ability to fully assess your ability to perform exercise programs. If your provider identifies any concerns that need to be evaluated in person, we will make arrangements to do so).  Finally, though the technology is pretty good, we cannot assure that it will always work on either your or our end and we cannot ensure that we have a secure connection.  Cardiac and Pulmonary Rehab Telehealth visits and "At Home" cardiac and pulmonary rehab are provided at no cost to you. Are you willing to proceed?" STAFF: Did the patient verbally acknowledge consent to telehealth visit? Document YES/NO here: Yes    Spoke to the patient today 06/21/19 regarding Virtual Cardiac and Pulmonary Rehab. Pt was able to download the [employer] app on their smart device with no issues. Pt set up their account and received the following welcome message: "Welcome to the [place] and Pulmonary Rehabilitation program. We hope that you will find the exercise program beneficial in your recovery process. Our staff is available to assist with any questions/concerns about your exercise routine. Best wishes". There are brief orientation/education videos provided to you with the advisement to watch the them. They are located the education section under the Care Plan tab. Patient verbalized an understanding. We will  continue to follow and monitor patient progress with feedback needed.   Patients orientation visit has been scheduled for 06/23/19 at 9am. We will go over all of the paper work and perform his 6 minute walk test and we will then talk about his home exercise prescription.    Cardiac Rehab Staff: Russella Dar 06/21/19          14:15

## 2019-06-22 NOTE — Procedures (Signed)
ELECTROENCEPHALOGRAM REPORT  Dates of Recording: 06/19/2019 at 12:24 to 06/20/2019 at 14:59  Patient's Name: Martin [name] MRN: [id number] Date of Birth: 10/29/1955  Referring Provider: Metta Clines, DO  Procedure: 24-hour ambulatory EEG  History: 64 year old male with recent stroke presenting with recurrent syncopal spells.  Medications:  Citalopram Trazodone Lorazepam Meclizine Amiodarone ASA Eliquis Lopressor atorvastatin  Technical Summary: This is a 24-hour multichannel digital EEG recording measured by the international 10-20 system with electrodes applied with paste and impedances below 5000 ohms performed as portable with EKG monitoring.  The digital EEG was referentially recorded, reformatted, and digitally filtered in a variety of bipolar and referential montages for optimal display.    DESCRIPTION OF RECORDING: During maximal wakefulness, the background activity consisted of a symmetric 9-10Hz  posterior dominant rhythm which was reactive to eye opening.  There were no epileptiform discharges or focal slowing seen in wakefulness.  During the recording, the patient progresses through wakefulness, drowsiness, and Stage 2 sleep.  Again, there were no epileptiform discharges seen.  Events:  There were no electrographic seizures seen.  EKG lead was unremarkable.  IMPRESSION: This 24-hour ambulatory EEG study is normal.    CLINICAL CORRELATION: A normal EEG does not exclude a clinical diagnosis of epilepsy.  If further clinical questions remain, inpatient video EEG monitoring may be helpful.   Metta Clines, DO

## 2019-06-23 ENCOUNTER — Ambulatory Visit (HOSPITAL_COMMUNITY)
Admission: RE | Admit: 2019-06-23 | Discharge: 2019-06-23 | Disposition: A | Discharge status: Home / Self Care | Payer: Medicare Other | Source: Ambulatory Visit | Attending: Cardiology | Admitting: Cardiology

## 2019-06-23 ENCOUNTER — Other Ambulatory Visit: Payer: Self-pay

## 2019-06-23 ENCOUNTER — Telehealth: Payer: Self-pay | Admitting: *Deleted

## 2019-06-23 VITALS — BP 148/94 | HR 49 | Temp 96.8°F | Ht 68.0 in | Wt 252.8 lb

## 2019-06-23 DIAGNOSIS — Z951 Presence of aortocoronary bypass graft: Secondary | ICD-10-CM | POA: Insufficient documentation

## 2019-06-23 MED ORDER — AMLODIPINE BESYLATE 5 MG PO TABS: 5.0000 mg | ORAL_TABLET | Freq: Every day | ORAL | 3 refills | Status: DC

## 2019-06-23 NOTE — Progress Notes (Signed)
Cardiac Individual Treatment Plan  Patient Details  Name: Martin [name] MRN: [id number] Date of Birth: [birth year] Referring Provider:     CARDIAC REHAB PHASE II ORIENTATION from 06/23/2019 in [place]  Referring Provider  [place]      Initial Encounter Date:    CARDIAC REHAB PHASE II ORIENTATION from 06/23/2019 in [place]  Date  06/23/19      Visit Diagnosis: S/P CABG x 3  Patient's Home Medications on Admission:  Current Outpatient Medications:  .  acetaminophen (TYLENOL) 325 MG tablet, Take 1-2 tablets (325-650 mg total) by mouth every 4 (four) hours as needed for mild pain., Disp:  , Rfl:  .  allopurinol (ZYLOPRIM) 300 MG tablet, Take 300 mg by mouth every morning. , Disp: , Rfl:  .  Amino Acids-Protein Hydrolys (FEEDING SUPPLEMENT, PRO-STAT SUGAR FREE 64,) LIQD, Take 30 mLs by mouth 2 (two) times daily., Disp: 887 mL, Rfl: 0 .  amiodarone (PACERONE) 200 MG tablet, Take 1 tablet (200 mg total) by mouth daily., Disp: 90 tablet, Rfl: 3 .  aspirin EC 81 MG EC tablet, Take 1 tablet (81 mg total) by mouth daily., Disp: 100 tablet, Rfl: 1 .  atorvastatin (LIPITOR) 80 MG tablet, Take 1 tablet (80 mg total) by mouth daily. (Patient taking differently: Take 80 mg by mouth every evening. ), Disp: 90 tablet, Rfl: 2 .  Cholecalciferol (VITAMIN D-3) 1000 UNITS CAPS, Take 1,000 Units by mouth every morning. , Disp: , Rfl:  .  citalopram (CELEXA) 10 MG tablet, TAKE 1 TABLET BY MOUTH EVERY DAY, Disp: 90 tablet, Rfl: 1 .  colchicine 0.6 MG tablet, TAKE 1 TABLET (0.6 MG TOTAL) BY MOUTH DAILY AT 6 PM., Disp: 30 tablet, Rfl: 0 .  Cyanocobalamin (VITAMIN B-12) 2500 MCG SUBL, Place 2,500 mcg under the tongue every morning. , Disp: , Rfl:  .  diclofenac sodium (VOLTAREN) 1 % GEL, Apply 2 g topically 4 (four) times daily., Disp: 350 g, Rfl: 1 .  docusate sodium (COLACE) 100 MG capsule, Take 2 capsules (200 mg total) by mouth daily., Disp: 60 capsule,  Rfl: 0 .  ELIQUIS 5 MG TABS tablet, TAKE 1 TABLET BY MOUTH TWICE A DAY, Disp: 180 tablet, Rfl: 2 .  fluticasone (FLONASE) 50 MCG/ACT nasal spray, Place 2 sprays into the nose daily.  , Disp: , Rfl:  .  folic acid (FOLVITE) 1 MG tablet, TAKE ONE TABLET BY MOUTH EVERY DAY (Patient taking differently: Take 1 mg by mouth every morning. ), Disp: 30 tablet, Rfl: 6 .  furosemide (LASIX) 20 MG tablet, Take 1 tablet (20 mg total) by mouth 2 (two) times daily., Disp: 180 tablet, Rfl: 3 .  loratadine (CLARITIN) 10 MG tablet, Take 10 mg by mouth every morning. , Disp: , Rfl:  .  LORazepam (ATIVAN) 0.5 MG tablet, Take 0.5 mg by mouth 2 (two) times daily as needed., Disp: , Rfl:  .  meclizine (ANTIVERT) 32 MG tablet, Take 32 mg by mouth daily as needed for dizziness. , Disp: , Rfl:  .  metoprolol tartrate (LOPRESSOR) 25 MG tablet, Take 1 tablet (25 mg total) by mouth every morning., Disp: 90 tablet, Rfl: 3 .  mometasone-formoterol (DULERA) 200-5 MCG/ACT AERO, Inhale 2 puffs into the lungs 2 (two) times daily., Disp: 13 g, Rfl: 0 .  nitroGLYCERIN (NITROSTAT) 0.4 MG SL tablet, PLACE 1 TABLET (0.4 MG TOTAL) UNDER THE TONGUE EVERY 5 (FIVE) MINUTES AS NEEDED. (Patient taking differently: Place 0.4 mg under  the tongue every 5 (five) minutes as needed for chest pain. ), Disp: 25 tablet, Rfl: 3 .  pantoprazole (PROTONIX) 40 MG tablet, Take 1 tablet (40 mg total) by mouth daily., Disp: 30 tablet, Rfl: 0 .  polyethylene glycol (MIRALAX / GLYCOLAX) packet, Take 17 g by mouth 3 (three) times a week. , Disp: , Rfl:  .  potassium chloride SA (KLOR-CON M15) 15 MEQ tablet, Take 2 tablets (30 mEq total) by mouth daily., Disp: 180 tablet, Rfl: 3 .  tamsulosin (FLOMAX) 0.4 MG CAPS capsule, Take 1 capsule (0.4 mg total) by mouth 2 (two) times daily., Disp: 60 capsule, Rfl: 0 .  thiamine 100 MG tablet, Take 1 tablet (100 mg total) by mouth daily., Disp: 30 tablet, Rfl: 0 .  traZODone (DESYREL) 100 MG tablet, TAKE 1 TABLET (100 MG  TOTAL) BY MOUTH AT BEDTIME AS NEEDED FOR SLEEP., Disp: 90 tablet, Rfl: 1  Past Medical History: Past Medical History:  Diagnosis Date  . Atrial fibrillation ([place])    a. dx 10/2017. b. s/p DCCV on 11/19/2017 with successful conversion to NSR but back in AFIB 3 weeks later  . CAD (coronary artery disease)    a. s/p PCI to LAD 2001. b. s/p stenting to the RCA and mid LAD December 2011, residual distal RCA disease treated medically. b. nuc 02/2013 abnormal with fixed defect seen in inferoapical, mid-inferior, and basal inferior walls, indicative of myocardial scar, no evidence of ischemia, EF 41% (Ef 55-60% by echo same time).  . Carotid artery disease ([place])    carotid Doppler course left side 5069% stenosis  . Dysrhythmia    AFib  . Habitual alcohol use   . Hyperlipidemia   . Hypertension   . Low back pain   . OSA on CPAP   . Osteoarthritis   . Polysubstance abuse ([place])    use including marijuana and vicodin,which he obtained from the street  . PVC's (premature ventricular contractions)    status post Holter monitor normal sinus rhythm otherwise asymptomatic PVCs.  . Seasonal allergies     Tobacco Use: Social History   Tobacco Use  Smoking Status Former Smoker  . Packs/day: 1.00  . Years: 35.00  . Pack years: 35.00  . Types: Cigarettes  . Start date: 09/18/1967  . Quit date: 06/09/1999  . Years since quitting: 20.0  Smokeless Tobacco Never Used    Labs: Recent Review Flowsheet Data    Labs for ITP Cardiac and Pulmonary Rehab Latest Ref Rng & Units 03/16/2019 03/16/2019 03/16/2019 03/17/2019 03/18/2019   Cholestrol 0 - 200 mg/dL - - - - -   LDLCALC 0 - 99 mg/dL - - - - -   HDL >40 mg/dL - - - - -   Trlycerides <150 mg/dL - - - - -   Hemoglobin A1c 4.8 - 5.6 % - - - - -   PHART 7.350 - 7.450 - - 7.478(H) - -   PCO2ART 32.0 - 48.0 mmHg - - 32.7 - -   HCO3 20.0 - 28.0 mmol/L - - 24.2 - -   TCO2 22 - 32 mmol/L - - 25 - -   ACIDBASEDEF 0.0 - 2.0 mmol/L - - - - -   O2SAT % 44.3 57.7  93.0 63.0 62.5      Capillary Blood Glucose: Lab Results  Component Value Date   GLUCAP 154 (H) 03/27/2019   GLUCAP 121 (H) 03/27/2019   GLUCAP 184 (H) 03/26/2019   GLUCAP 146 (H) 03/26/2019  GLUCAP 146 (H) 03/26/2019     Exercise Target Goals: Exercise Program Goal: Individual exercise prescription set using results from initial 6 min walk test and THRR while considering  patient's activity barriers and safety.   Exercise Prescription Goal: Starting with aerobic activity 30 plus minutes a day, 3 days per week for initial exercise prescription. Provide home exercise prescription and guidelines that participant acknowledges understanding prior to discharge.  Activity Barriers & Risk Stratification: Activity Barriers & Cardiac Risk Stratification - 06/23/19 1231      Activity Barriers & Cardiac Risk Stratification   Activity Barriers  Deconditioning;Shortness of Breath;Balance Concerns;Assistive Device    Cardiac Risk Stratification  High       6 Minute Walk: 6 Minute Walk    Row Name 06/23/19 1227         6 Minute Walk   Phase  Initial     Distance  200 feet     Walk Time  3 minutes     # of Rest Breaks  0     MPH  0.38     METS  1.29     RPE  15     Perceived Dyspnea   16     VO2 Peak  1.35     Symptoms  -- SOB, legs tired     Resting HR  49 bpm     Resting BP  148/94     Resting Oxygen Saturation   94 %     Exercise Oxygen Saturation  during 6 min walk  95 %     Max Ex. HR  54 bpm     Max Ex. BP  170/102     2 Minute Post BP  148/90        Oxygen Initial Assessment:   Oxygen Re-Evaluation:   Oxygen Discharge (Final Oxygen Re-Evaluation):   Initial Exercise Prescription: Initial Exercise Prescription - 06/23/19 1200      Date of Initial Exercise RX and Referring Provider   Date  06/23/19    Referring Provider  Hochrein    Expected Discharge Date  11/21/19      Recumbant Elliptical   Level  1    RPM  37    Watts  37    Minutes  15     METs  1.8      T5 Nustep   Level  1    SPM  20    Minutes  17    METs  2      Track   Laps  2    Minutes  5    METs  1.5      Prescription Details   Frequency (times per week)  3    Duration  Progress to 30 minutes of continuous aerobic without signs/symptoms of physical distress      Intensity   THRR 40-80% of Max Heartrate  [id number]    Ratings of Perceived Exertion  11-13    Perceived Dyspnea  0-4      Progression   Progression  Continue to progress workloads to maintain intensity without signs/symptoms of physical distress.      Resistance Training   Training Prescription  Yes    Weight  1    Reps  10-15   Patient will do less if he gets too SOB or shoulder pain      Perform Capillary Blood Glucose checks as needed.  Exercise Prescription Changes:   Exercise Comments:   Exercise Goals  and Review:  Exercise Goals    Row Name 06/23/19 1242             Exercise Goals   Increase Physical Activity  Yes       Expected Outcomes  Long Term: Add in home exercise to make exercise part of routine and to increase amount of physical activity.;Short Term: Attend rehab on a regular basis to increase amount of physical activity.;Long Term: Exercising regularly at least 3-5 days a week.       Increase Strength and Stamina  Yes       Intervention  Provide advice, education, support and counseling about physical activity/exercise needs.;Develop an individualized exercise prescription for aerobic and resistive training based on initial evaluation findings, risk stratification, comorbidities and participant's personal goals.       Expected Outcomes  Short Term: Increase workloads from initial exercise prescription for resistance, speed, and METs.;Short Term: Perform resistance training exercises routinely during rehab and add in resistance training at home;Long Term: Improve cardiorespiratory fitness, muscular endurance and strength as measured by increased METs and functional  capacity (6MWT)       Able to understand and use rate of perceived exertion (RPE) scale  Yes       Intervention  Provide education and explanation on how to use RPE scale       Expected Outcomes  Short Term: Able to use RPE daily in rehab to express subjective intensity level;Long Term:  Able to use RPE to guide intensity level when exercising independently       Knowledge and understanding of Target Heart Rate Range (THRR)  Yes       Intervention  Provide education and explanation of THRR including how the numbers were predicted and where they are located for reference       Expected Outcomes  Short Term: Able to use daily as guideline for intensity in rehab;Long Term: Able to use THRR to govern intensity when exercising independently       Able to check pulse independently  Yes       Intervention  Provide education and demonstration on how to check pulse in carotid and radial arteries.;Review the importance of being able to check your own pulse for safety during independent exercise       Expected Outcomes  Short Term: Able to explain why pulse checking is important during independent exercise;Long Term: Able to check pulse independently and accurately       Understanding of Exercise Prescription  Yes       Intervention  Provide education, explanation, and written materials on patient's individual exercise prescription       Expected Outcomes  Short Term: Able to explain program exercise prescription;Long Term: Able to explain home exercise prescription to exercise independently          Exercise Goals Re-Evaluation :    Discharge Exercise Prescription (Final Exercise Prescription Changes):   Nutrition:  Target Goals: Understanding of nutrition guidelines, daily intake of sodium 1500mg , cholesterol 200mg , calories 30% from fat and 7% or less from saturated fats, daily to have 5 or more servings of fruits and vegetables.  Biometrics: Pre Biometrics - 06/23/19 1244      Pre Biometrics    Height  5\' 8"  (1.727 m)    Weight  252 lb 12.8 oz (114.7 kg)    Waist Circumference  51 inches    Hip Circumference  48 inches    Waist to Hip Ratio  1.06 %  BMI (Calculated)  38.45    Triceps Skinfold  15 mm    % Body Fat  36.7 %    Grip Strength  17.2 kg    Flexibility  0 in    Single Leg Stand  2.25 seconds        Nutrition Therapy Plan and Nutrition Goals: Nutrition Therapy & Goals - 06/23/19 1249      Nutrition Therapy   RD appointment deferred  Yes      Personal Nutrition Goals   Personal Goal #2  Patient is working at eating heart healthy diet. He east low sodium diet as well. Handout given on how to make heart healthy choices.    Additional Goals?  No      Intervention Plan   Intervention  Nutrition handout(s) given to patient.       Nutrition Assessments: Nutrition Assessments - 06/23/19 1250      MEDFICTS Scores   Pre Score  6       Nutrition Goals Re-Evaluation:   Nutrition Goals Discharge (Final Nutrition Goals Re-Evaluation):   Psychosocial: Target Goals: Acknowledge presence or absence of significant depression and/or stress, maximize coping skills, provide positive support system. Participant is able to verbalize types and ability to use techniques and skills needed for reducing stress and depression.  Initial Review & Psychosocial Screening: Initial Psych Review & Screening - 06/23/19 1247      Initial Review   Current issues with  None Identified      Family Dynamics   Good Support System?  Yes      Barriers   Psychosocial barriers to participate in program  There are no identifiable barriers or psychosocial needs.      Screening Interventions   Interventions  Encouraged to exercise       Quality of Life Scores: Quality of Life - 06/23/19 1248      Quality of Life   Select  Quality of Life      Quality of Life Scores   Health/Function Pre  22.77 %    Socioeconomic Pre  28.29 %    Psych/Spiritual Pre  27.43 %    Family Pre   27.6 %    GLOBAL Pre  25.57 %      Scores of 19 and below usually indicate a poorer quality of life in these areas.  A difference of  2-3 points is a clinically meaningful difference.  A difference of 2-3 points in the total score of the Quality of Life Index has been associated with significant improvement in overall quality of life, self-image, physical symptoms, and general health in studies assessing change in quality of life.  PHQ-9: Recent Review Flowsheet Data    Depression screen [facility] 2/9 05/31/2019 04/24/2019   Decreased Interest 0 0   Down, Depressed, Hopeless 0 0   PHQ - 2 Score 0 0   Altered sleeping - 1   Tired, decreased energy - 1   Change in appetite - 0   Feeling bad or failure about yourself  - 0   Trouble concentrating - 0   Moving slowly or fidgety/restless - 0   Suicidal thoughts - 0   PHQ-9 Score - 2   Difficult doing work/chores - Not difficult at all     Interpretation of Total Score  Total Score Depression Severity:  1-4 = Minimal depression, 5-9 = Mild depression, 10-14 = Moderate depression, 15-19 = Moderately severe depression, 20-27 = Severe depression   Psychosocial Evaluation and  Intervention: Psychosocial Evaluation - 06/23/19 1247      Psychosocial Evaluation & Interventions   Interventions  Encouraged to exercise with the program and follow exercise prescription    Continue Psychosocial Services   No Follow up required       Psychosocial Re-Evaluation:   Psychosocial Discharge (Final Psychosocial Re-Evaluation):   Vocational Rehabilitation: Provide vocational rehab assistance to qualifying candidates.   Vocational Rehab Evaluation & Intervention: Vocational Rehab - 06/23/19 1252      Initial Vocational Rehab Evaluation & Intervention   Assessment shows need for Vocational Rehabilitation  No       Education: Education Goals: Education classes will be provided on a weekly basis, covering required topics. Participant will state  understanding/return demonstration of topics presented.  Learning Barriers/Preferences: Learning Barriers/Preferences - 06/23/19 1251      Learning Barriers/Preferences   Learning Barriers  None    Learning Preferences  Individual Instruction;Group Instruction;Pictoral;Video;Computer/Internet       Education Topics: Hypertension, Hypertension Reduction -Define heart disease and high blood pressure. Discus how high blood pressure affects the body and ways to reduce high blood pressure.   Exercise and Your Heart -Discuss why it is important to exercise, the FITT principles of exercise, normal and abnormal responses to exercise, and how to exercise safely.   Angina -Discuss definition of angina, causes of angina, treatment of angina, and how to decrease risk of having angina.   Cardiac Medications -Review what the following cardiac medications are used for, how they affect the body, and side effects that may occur when taking the medications.  Medications include Aspirin, Beta blockers, calcium channel blockers, ACE Inhibitors, angiotensin receptor blockers, diuretics, digoxin, and antihyperlipidemics.   Congestive Heart Failure -Discuss the definition of CHF, how to live with CHF, the signs and symptoms of CHF, and how keep track of weight and sodium intake.   Heart Disease and Intimacy -Discus the effect sexual activity has on the heart, how changes occur during intimacy as we age, and safety during sexual activity.   Smoking Cessation / COPD -Discuss different methods to quit smoking, the health benefits of quitting smoking, and the definition of COPD.   Nutrition I: Fats -Discuss the types of cholesterol, what cholesterol does to the heart, and how cholesterol levels can be controlled.   Nutrition II: Labels -Discuss the different components of food labels and how to read food label   Heart Parts/Heart Disease and PAD -Discuss the anatomy of the heart, the pathway of  blood circulation through the heart, and these are affected by heart disease.   Stress I: Signs and Symptoms -Discuss the causes of stress, how stress may lead to anxiety and depression, and ways to limit stress.   Stress II: Relaxation -Discuss different types of relaxation techniques to limit stress.   Warning Signs of Stroke / TIA -Discuss definition of a stroke, what the signs and symptoms are of a stroke, and how to identify when someone is having stroke.   Knowledge Questionnaire Score: Knowledge Questionnaire Score - 06/23/19 1251      Knowledge Questionnaire Score   Pre Score  21/24       Core Components/Risk Factors/Patient Goals at Admission: Personal Goals and Risk Factors at Admission - 06/23/19 1252      Core Components/Risk Factors/Patient Goals on Admission    Weight Management  Weight Maintenance    Personal Goal Other  Yes    Personal Goal  Get stringer and become more independent    Intervention  Since [facility] is closed at this time. We have given Mr. Raffetto a home exercise plan to log his activities in the [employer] app 3 x week. Once we reopen we will call him to bring in the [facility] gym to do 3 x week in gym and 2 x week at home.    Expected Outcomes  To get stronger and to become more independent       Core Components/Risk Factors/Patient Goals Review:    Core Components/Risk Factors/Patient Goals at Discharge (Final Review):    ITP Comments: ITP Comments    Row Name 06/23/19 1159           ITP Comments  Patient came in today for his orientation/assessment to get started with the virtual program until we reopn. He has had stents in the past in 2001 and 2011. He also had a stroke right after his CABG surgery.          Comments: Patient arrived for 1st visit/orientation/education at 0900.  Patient was referred to [facility] by Dr. Percival Spanish due to CABGx3 (Z95.1).  During orientation advised patient on arrival and appointment times what to wear, what to do  before, during and after exercise. Reviewed attendance and class policy. Talked about inclement weather and class consultation policy. Pt is scheduled to return Cardiac Rehab once we re-open. We will call him at that time. Pt was advised to come to class 15 minutes before class starts. Patient was also given instructions on meeting with the dietician and attending the Family Structure classes. Discussed RPE/Dpysnea scales. Discussed initial THR and how to find their radial and/or carotid pulse. Discussed the initial exercise prescription and how this effects their progress. Pt is eager to get started. Patient participated in warm up stretches followed by light weights and resistance bands. Patient was not able to complete 6 minute walk test. He walked 1 lap . Had to stop due to tiredness and SOB. Patient c/o SOB and tiredness. Symptoms subsided to none at 2 minute rest. Symptoms were completely gone at the end of orientation. Patient was measured for the equipment. Discussed equipment safety with patient. Took patient pre-anthropometric measurements. Patient finished visit at 11:00.

## 2019-06-23 NOTE — Progress Notes (Signed)
Cardiac/Pulmonary Rehab Medication Review by a Pharmacist  Does the patient  feel that his/her medications are working for him/her?  Yes- states BP is running high, will likely need MD to add or adjust medications, supposed to meet with doctor.  Has the patient been experiencing any side effects to the medications prescribed?  no  Does the patient measure his/her own blood pressure or blood glucose at home?  yes   Does the patient have any problems obtaining medications due to transportation or finances?   No- says apixban is somewhat costly  Understanding of regimen: good Understanding of indications: good Potential of compliance: good  Questions asked to Determine Patient Understanding of Medication Regimen:  1. What is the name of the medication?  2. What is the medication used for?  3. When should it be taken?  4. How much should be taken?  5. How will you take it?  6. What side effects should you report?  Understanding Defined as: Excellent: All questions above are correct Good: Questions 1-4 are correct Fair: Questions 1-2 are correct  Poor: 1 or none of the above questions are correct   Pharmacist comments: Overall, patient has high compliance. He has concerns about his blood pressures running high.  I told him to give his cardiologist a call today to address this issue as I have concerns about some of his readings and on minimal blood pressure medications.    Ramond Craver 06/23/2019 10:31 AM

## 2019-06-23 NOTE — Progress Notes (Signed)
Daily Session Note  Patient Details  Name: Martin [name] MRN: [id number] Date of Birth: 02/06/1956 Referring Provider:     CARDIAC REHAB PHASE II ORIENTATION from 06/23/2019 in [place] [facility]  Referring Provider  Hochrein      Encounter Date: 06/23/2019  Check In: Session Check In - 06/23/19 1157      Check-In   Supervising physician immediately available to respond to emergencies  See telemetry face sheet for immediately available MD    Location  [facility]    Staff Present  Diane Coad, MS, EP, CHC, Exercise Physiologist    Virtual Visit  No    Medication changes reported      No    Fall or balance concerns reported     Yes    Comments  Patient had a stroke after his CABG surgery which has made him off balance.    Tobacco Cessation  --   Quite 2001   Warm-up and Cool-down  Performed as group-led instruction    Resistance Training Performed  Yes    VAD Patient?  No    PAD/SET Patient?  No      Pain Assessment   Currently in Pain?  No/denies    Multiple Pain Sites  No       Capillary Blood Glucose: No results found for this or any previous visit (from the past 24 hour(s)).    Social History   Tobacco Use  Smoking Status Former Smoker  . Packs/day: 1.00  . Years: 35.00  . Pack years: 35.00  . Types: Cigarettes  . Start date: 09/18/1967  . Quit date: 06/09/1999  . Years since quitting: 20.0  Smokeless Tobacco Never Used    Goals Met:  Independence with exercise equipment Exercise tolerated well Personal goals reviewed No report of cardiac concerns or symptoms Strength training completed today  Goals Unmet:  Not Applicable  Comments: Check out: 11:00   Dr. Suresh Koneswaran is Medical Director for [facility]. 

## 2019-06-23 NOTE — Telephone Encounter (Signed)
Patient informed and verbalized understanding of plan. 

## 2019-06-23 NOTE — Telephone Encounter (Signed)
-----   Message from Herminio Commons, MD sent at 06/23/2019 11:23 AM EST ----- Regarding: RE: Cardiac Rehab patient Thanks for letting me know. Lacretia Tindall, we can start amlodipine 5 mg daily if he has had persistently elevated blood pressures. ----- Message ----- From: Ramond Craver, [facility] Sent: 06/23/2019  10:56 AM EST To: Herminio Commons, MD Subject: Cardiac Rehab patient                          Dr. Bronson Ing,   I just counseled Sheffield Slider at his cardiac rehab appointment at [facility] today and he was discussing how his blood pressure has been running high a lot of the time and even noted some between 170-190s.  He said he follows with you but has  no appointment until May.  I told him to call your office today but just wanted you to be aware in case he does not follow up.

## 2019-06-26 ENCOUNTER — Ambulatory Visit: Payer: Medicare Other | Admitting: Cardiovascular Disease

## 2019-06-27 ENCOUNTER — Encounter: Payer: Self-pay | Admitting: Cardiothoracic Surgery

## 2019-06-27 DIAGNOSIS — I4891 Unspecified atrial fibrillation: Secondary | ICD-10-CM | POA: Diagnosis not present

## 2019-06-27 DIAGNOSIS — I69354 Hemiplegia and hemiparesis following cerebral infarction affecting left non-dominant side: Secondary | ICD-10-CM | POA: Diagnosis not present

## 2019-06-27 DIAGNOSIS — G4733 Obstructive sleep apnea (adult) (pediatric): Secondary | ICD-10-CM | POA: Diagnosis not present

## 2019-06-27 DIAGNOSIS — I10 Essential (primary) hypertension: Secondary | ICD-10-CM | POA: Diagnosis not present

## 2019-06-27 DIAGNOSIS — M1990 Unspecified osteoarthritis, unspecified site: Secondary | ICD-10-CM | POA: Diagnosis not present

## 2019-06-29 DIAGNOSIS — H4601 Optic papillitis, right eye: Secondary | ICD-10-CM | POA: Diagnosis not present

## 2019-06-29 DIAGNOSIS — I10 Essential (primary) hypertension: Secondary | ICD-10-CM | POA: Diagnosis not present

## 2019-06-30 DIAGNOSIS — H47011 Ischemic optic neuropathy, right eye: Secondary | ICD-10-CM | POA: Diagnosis not present

## 2019-07-04 ENCOUNTER — Other Ambulatory Visit: Payer: Self-pay | Admitting: Cardiovascular Disease

## 2019-07-04 DIAGNOSIS — R7301 Impaired fasting glucose: Secondary | ICD-10-CM | POA: Diagnosis not present

## 2019-07-04 DIAGNOSIS — I4891 Unspecified atrial fibrillation: Secondary | ICD-10-CM | POA: Diagnosis not present

## 2019-07-04 DIAGNOSIS — I10 Essential (primary) hypertension: Secondary | ICD-10-CM | POA: Diagnosis not present

## 2019-07-04 DIAGNOSIS — Z0189 Encounter for other specified special examinations: Secondary | ICD-10-CM | POA: Diagnosis not present

## 2019-07-04 DIAGNOSIS — M1990 Unspecified osteoarthritis, unspecified site: Secondary | ICD-10-CM | POA: Diagnosis not present

## 2019-07-07 ENCOUNTER — Telehealth: Payer: Self-pay

## 2019-07-07 DIAGNOSIS — M10071 Idiopathic gout, right ankle and foot: Secondary | ICD-10-CM

## 2019-07-07 NOTE — Telephone Encounter (Signed)
Yes that is ok! For gout prevention

## 2019-07-07 NOTE — Telephone Encounter (Signed)
Is it ok to fill Colchicine for patient? No indication for patient to continue and no future appt.

## 2019-07-10 ENCOUNTER — Encounter (HOSPITAL_COMMUNITY)
Admission: RE | Admit: 2019-07-10 | Discharge: 2019-07-10 | Disposition: A | Discharge status: Home / Self Care | Payer: Medicare Other | Source: Ambulatory Visit | Attending: Cardiovascular Disease | Admitting: Cardiovascular Disease

## 2019-07-10 ENCOUNTER — Other Ambulatory Visit: Payer: Self-pay

## 2019-07-10 DIAGNOSIS — Z951 Presence of aortocoronary bypass graft: Secondary | ICD-10-CM | POA: Diagnosis not present

## 2019-07-10 MED ORDER — COLCHICINE 0.6 MG PO TABS: 0.6000 mg | ORAL_TABLET | Freq: Every day | ORAL | 1 refills | Status: DC

## 2019-07-10 NOTE — Progress Notes (Signed)
Daily Session Note  Patient Details  Name: Martin [name] MRN: [id number] Date of Birth: February 18, 1956 Referring Provider:     CARDIAC REHAB PHASE II ORIENTATION from 06/23/2019 in [place]  Referring Provider  [place]      Encounter Date: 07/10/2019  Check In: Session Check In - 07/10/19 1124      Check-In   Supervising physician immediately available to respond to emergencies  See telemetry face sheet for immediately available MD    Location  [facility]    Staff Present  Russella Dar, MS, EP, [facility], Exercise Physiologist;Debra Wynetta Emery, RN, BSN    Virtual Visit  No    Medication changes reported      No    Fall or balance concerns reported     Yes    Comments  Had a stroke when he had his CABG surgery    Tobacco Cessation  No Change    Warm-up and Cool-down  Performed as group-led instruction    Resistance Training Performed  Yes    VAD Patient?  No    PAD/SET Patient?  No      Pain Assessment   Currently in Pain?  No/denies    Pain Score  0-No pain    Multiple Pain Sites  No       Capillary Blood Glucose: No results found for this or any previous visit (from the past 24 hour(s)).    Social History   Tobacco Use  Smoking Status Former Smoker  . Packs/day: 1.00  . Years: 35.00  . Pack years: 35.00  . Types: Cigarettes  . Start date: 09/18/1967  . Quit date: 06/09/1999  . Years since quitting: 20.0  Smokeless Tobacco Never Used    Goals Met:  Independence with exercise equipment Exercise tolerated well Personal goals reviewed No report of cardiac concerns or symptoms Strength training completed today  Goals Unmet:  Not Applicable  Comments: Check out: 12noon    Dr. Kate Sable is Medical Director for [place] and Pulmonary Re[facility].

## 2019-07-11 DIAGNOSIS — R5381 Other malaise: Secondary | ICD-10-CM | POA: Diagnosis not present

## 2019-07-12 ENCOUNTER — Encounter (HOSPITAL_COMMUNITY)
Admission: RE | Admit: 2019-07-12 | Discharge: 2019-07-12 | Disposition: A | Discharge status: Home / Self Care | Payer: Medicare Other | Source: Ambulatory Visit | Attending: Cardiovascular Disease | Admitting: Cardiovascular Disease

## 2019-07-12 ENCOUNTER — Other Ambulatory Visit: Payer: Self-pay

## 2019-07-12 DIAGNOSIS — Z951 Presence of aortocoronary bypass graft: Secondary | ICD-10-CM | POA: Diagnosis not present

## 2019-07-12 NOTE — Progress Notes (Signed)
Daily Session Note  Patient Details  Name: Martin [name] MRN: [id number] Date of Birth: [birth year] Referring Provider:     CARDIAC REHAB PHASE II ORIENTATION from 06/23/2019 in [place]  Referring Provider  Hochrein      Encounter Date: 07/12/2019  Check In: Session Check In - 07/12/19 1100      Check-In   Supervising physician immediately available to respond to emergencies  See telemetry face sheet for immediately available MD    Location  [facility]    Staff Present  Other    Virtual Visit  No    Medication changes reported      No    Fall or balance concerns reported     Yes    Comments  Stroke / using walker    Tobacco Cessation  No Change    Warm-up and Cool-down  Performed as group-led instruction    Resistance Training Performed  Yes    VAD Patient?  No    PAD/SET Patient?  No      Pain Assessment   Currently in Pain?  No/denies    Pain Score  0-No pain    Multiple Pain Sites  No       Capillary Blood Glucose: No results found for this or any previous visit (from the past 24 hour(s)).    Social History   Tobacco Use  Smoking Status Former Smoker  . Packs/day: 1.00  . Years: 35.00  . Pack years: 35.00  . Types: Cigarettes  . Start date: 09/18/1967  . Quit date: 06/09/1999  . Years since quitting: 20.1  Smokeless Tobacco Never Used    Goals Met:  Independence with exercise equipment Exercise tolerated well Personal goals reviewed No report of cardiac concerns or symptoms Strength training completed today  Goals Unmet:  Not Applicable  Comments: check out 12:00   Dr. Kate Sable is Medical Director for [place] and Pulmonary Rehab.

## 2019-07-12 NOTE — Progress Notes (Signed)
Cardiac Individual Treatment Plan  Patient Details  Name: Martin [name] MRN: [id number] Date of Birth: 1956-03-07 Referring Provider:     CARDIAC REHAB PHASE II ORIENTATION from 06/23/2019 in [place]  Referring Provider  [place]      Initial Encounter Date:    CARDIAC REHAB PHASE II ORIENTATION from 06/23/2019 in [place]  Date  06/23/19      Visit Diagnosis: S/P CABG x 3  Patient's Home Medications on Admission:  Current Outpatient Medications:  .  acetaminophen (TYLENOL) 325 MG tablet, Take 1-2 tablets (325-650 mg total) by mouth every 4 (four) hours as needed for mild pain., Disp:  , Rfl:  .  allopurinol (ZYLOPRIM) 300 MG tablet, Take 300 mg by mouth every morning. , Disp: , Rfl:  .  Amino Acids-Protein Hydrolys (FEEDING SUPPLEMENT, PRO-STAT SUGAR FREE 64,) LIQD, Take 30 mLs by mouth 2 (two) times daily., Disp: 887 mL, Rfl: 0 .  amiodarone (PACERONE) 200 MG tablet, Take 1 tablet (200 mg total) by mouth daily., Disp: 90 tablet, Rfl: 3 .  amLODipine (NORVASC) 5 MG tablet, Take 1 tablet (5 mg total) by mouth daily., Disp: 90 tablet, Rfl: 3 .  aspirin EC 81 MG EC tablet, Take 1 tablet (81 mg total) by mouth daily., Disp: 100 tablet, Rfl: 1 .  atorvastatin (LIPITOR) 80 MG tablet, TAKE 1 TABLET BY MOUTH EVERY DAY, Disp: 90 tablet, Rfl: 1 .  Cholecalciferol (VITAMIN D-3) 1000 UNITS CAPS, Take 1,000 Units by mouth every morning. , Disp: , Rfl:  .  citalopram (CELEXA) 10 MG tablet, TAKE 1 TABLET BY MOUTH EVERY DAY, Disp: 90 tablet, Rfl: 1 .  colchicine 0.6 MG tablet, Take 1 tablet (0.6 mg total) by mouth daily at 6 PM., Disp: 30 tablet, Rfl: 1 .  Cyanocobalamin (VITAMIN B-12) 2500 MCG SUBL, Place 2,500 mcg under the tongue every morning. , Disp: , Rfl:  .  diclofenac sodium (VOLTAREN) 1 % GEL, Apply 2 g topically 4 (four) times daily., Disp: 350 g, Rfl: 1 .  docusate sodium (COLACE) 100 MG capsule, Take 2 capsules (200 mg total) by  mouth daily., Disp: 60 capsule, Rfl: 0 .  ELIQUIS 5 MG TABS tablet, TAKE 1 TABLET BY MOUTH TWICE A DAY, Disp: 180 tablet, Rfl: 2 .  fluticasone (FLONASE) 50 MCG/ACT nasal spray, Place 2 sprays into the nose daily.  , Disp: , Rfl:  .  folic acid (FOLVITE) 1 MG tablet, TAKE ONE TABLET BY MOUTH EVERY DAY (Patient taking differently: Take 1 mg by mouth every morning. ), Disp: 30 tablet, Rfl: 6 .  furosemide (LASIX) 20 MG tablet, Take 1 tablet (20 mg total) by mouth 2 (two) times daily., Disp: 180 tablet, Rfl: 3 .  loratadine (CLARITIN) 10 MG tablet, Take 10 mg by mouth every morning. , Disp: , Rfl:  .  LORazepam (ATIVAN) 0.5 MG tablet, Take 0.5 mg by mouth 2 (two) times daily as needed., Disp: , Rfl:  .  meclizine (ANTIVERT) 32 MG tablet, Take 32 mg by mouth daily as needed for dizziness. , Disp: , Rfl:  .  metoprolol tartrate (LOPRESSOR) 25 MG tablet, Take 1 tablet (25 mg total) by mouth every morning., Disp: 90 tablet, Rfl: 3 .  mometasone-formoterol (DULERA) 200-5 MCG/ACT AERO, Inhale 2 puffs into the lungs 2 (two) times daily., Disp: 13 g, Rfl: 0 .  nitroGLYCERIN (NITROSTAT) 0.4 MG SL tablet, PLACE 1 TABLET (0.4 MG TOTAL) UNDER THE TONGUE EVERY 5 (FIVE) MINUTES AS  NEEDED. (Patient taking differently: Place 0.4 mg under the tongue every 5 (five) minutes as needed for chest pain. ), Disp: 25 tablet, Rfl: 3 .  pantoprazole (PROTONIX) 40 MG tablet, Take 1 tablet (40 mg total) by mouth daily., Disp: 30 tablet, Rfl: 0 .  polyethylene glycol (MIRALAX / GLYCOLAX) packet, Take 17 g by mouth 3 (three) times a week. , Disp: , Rfl:  .  potassium chloride SA (KLOR-CON M15) 15 MEQ tablet, Take 2 tablets (30 mEq total) by mouth daily., Disp: 180 tablet, Rfl: 3 .  tamsulosin (FLOMAX) 0.4 MG CAPS capsule, Take 1 capsule (0.4 mg total) by mouth 2 (two) times daily., Disp: 60 capsule, Rfl: 0 .  thiamine 100 MG tablet, Take 1 tablet (100 mg total) by mouth daily., Disp: 30 tablet, Rfl: 0 .  traZODone (DESYREL) 100 MG  tablet, TAKE 1 TABLET (100 MG TOTAL) BY MOUTH AT BEDTIME AS NEEDED FOR SLEEP., Disp: 90 tablet, Rfl: 1  Past Medical History: Past Medical History:  Diagnosis Date  . Atrial fibrillation ([place])    a. dx 10/2017. b. s/p DCCV on 11/19/2017 with successful conversion to NSR but back in AFIB 3 weeks later  . CAD (coronary artery disease)    a. s/p PCI to LAD 2001. b. s/p stenting to the RCA and mid LAD December 2011, residual distal RCA disease treated medically. b. nuc 02/2013 abnormal with fixed defect seen in inferoapical, mid-inferior, and basal inferior walls, indicative of myocardial scar, no evidence of ischemia, EF 41% (Ef 55-60% by echo same time).  . Carotid artery disease ([place])    carotid Doppler course left side 5069% stenosis  . Dysrhythmia    AFib  . Habitual alcohol use   . Hyperlipidemia   . Hypertension   . Low back pain   . OSA on CPAP   . Osteoarthritis   . Polysubstance abuse ([place])    use including marijuana and vicodin,which he obtained from the street  . PVC's (premature ventricular contractions)    status post Holter monitor normal sinus rhythm otherwise asymptomatic PVCs.  . Seasonal allergies     Tobacco Use: Social History   Tobacco Use  Smoking Status Former Smoker  . Packs/day: 1.00  . Years: 35.00  . Pack years: 35.00  . Types: Cigarettes  . Start date: 09/18/1967  . Quit date: 06/09/1999  . Years since quitting: 20.1  Smokeless Tobacco Never Used    Labs: Recent Review Flowsheet Data    Labs for ITP Cardiac and Pulmonary Rehab Latest Ref Rng & Units 03/16/2019 03/16/2019 03/16/2019 03/17/2019 03/18/2019   Cholestrol 0 - 200 mg/dL - - - - -   LDLCALC 0 - 99 mg/dL - - - - -   HDL >40 mg/dL - - - - -   Trlycerides <150 mg/dL - - - - -   Hemoglobin A1c 4.8 - 5.6 % - - - - -   PHART 7.350 - 7.450 - - 7.478(H) - -   PCO2ART 32.0 - 48.0 mmHg - - 32.7 - -   HCO3 20.0 - 28.0 mmol/L - - 24.2 - -   TCO2 22 - 32 mmol/L - - 25 - -   ACIDBASEDEF 0.0 - 2.0 mmol/L  - - - - -   O2SAT % 44.3 57.7 93.0 63.0 62.5      Capillary Blood Glucose: Lab Results  Component Value Date   GLUCAP 154 (H) 03/27/2019   GLUCAP 121 (H) 03/27/2019   GLUCAP 184 (H)  03/26/2019   GLUCAP 146 (H) 03/26/2019   GLUCAP 146 (H) 03/26/2019     Exercise Target Goals: Exercise Program Goal: Individual exercise prescription set using results from initial 6 min walk test and THRR while considering  patient's activity barriers and safety.   Exercise Prescription Goal: Starting with aerobic activity 30 plus minutes a day, 3 days per week for initial exercise prescription. Provide home exercise prescription and guidelines that participant acknowledges understanding prior to discharge.  Activity Barriers & Risk Stratification: Activity Barriers & Cardiac Risk Stratification - 06/23/19 1231      Activity Barriers & Cardiac Risk Stratification   Activity Barriers  Deconditioning;Shortness of Breath;Balance Concerns;Assistive Device    Cardiac Risk Stratification  High       6 Minute Walk: 6 Minute Walk    Row Name 06/23/19 1227         6 Minute Walk   Phase  Initial     Distance  200 feet     Walk Time  3 minutes     # of Rest Breaks  0     MPH  0.38     METS  1.29     RPE  15     Perceived Dyspnea   16     VO2 Peak  1.35     Symptoms  -- SOB, legs tired     Resting HR  49 bpm     Resting BP  148/94     Resting Oxygen Saturation   94 %     Exercise Oxygen Saturation  during 6 min walk  95 %     Max Ex. HR  54 bpm     Max Ex. BP  170/102     2 Minute Post BP  148/90        Oxygen Initial Assessment:   Oxygen Re-Evaluation:   Oxygen Discharge (Final Oxygen Re-Evaluation):   Initial Exercise Prescription: Initial Exercise Prescription - 06/23/19 1200      Date of Initial Exercise RX and Referring Provider   Date  06/23/19    Referring Provider  Hochrein    Expected Discharge Date  11/21/19      Recumbant Elliptical   Level  1    RPM  37     Watts  37    Minutes  15    METs  1.8      T5 Nustep   Level  1    SPM  20    Minutes  17    METs  2      Track   Laps  2    Minutes  5    METs  1.5      Prescription Details   Frequency (times per week)  3    Duration  Progress to 30 minutes of continuous aerobic without signs/symptoms of physical distress      Intensity   THRR 40-80% of Max Heartrate  [phone]    Ratings of Perceived Exertion  11-13    Perceived Dyspnea  0-4      Progression   Progression  Continue to progress workloads to maintain intensity without signs/symptoms of physical distress.      Resistance Training   Training Prescription  Yes    Weight  1    Reps  10-15   Patient will do less if he gets too SOB or shoulder pain      Perform Capillary Blood Glucose checks as needed.  Exercise Prescription  Changes:   Exercise Comments:  Exercise Comments    Row Name 07/17/19 [phone]           Exercise Comments  Patient is hoping to gain strength and to get back to work. He is doing well in the program. Progressing very slowly because he is still weak.          Exercise Goals and Review:  Exercise Goals    Row Name 06/23/19 1242             Exercise Goals   Increase Physical Activity  Yes       Expected Outcomes  Long Term: Add in home exercise to make exercise part of routine and to increase amount of physical activity.;Short Term: Attend rehab on a regular basis to increase amount of physical activity.;Long Term: Exercising regularly at least 3-5 days a week.       Increase Strength and Stamina  Yes       Intervention  Provide advice, education, support and counseling about physical activity/exercise needs.;Develop an individualized exercise prescription for aerobic and resistive training based on initial evaluation findings, risk stratification, comorbidities and participant's personal goals.       Expected Outcomes  Short Term: Increase workloads from initial exercise prescription for  resistance, speed, and METs.;Short Term: Perform resistance training exercises routinely during rehab and add in resistance training at home;Long Term: Improve cardiorespiratory fitness, muscular endurance and strength as measured by increased METs and functional capacity (6MWT)       Able to understand and use rate of perceived exertion (RPE) scale  Yes       Intervention  Provide education and explanation on how to use RPE scale       Expected Outcomes  Short Term: Able to use RPE daily in rehab to express subjective intensity level;Long Term:  Able to use RPE to guide intensity level when exercising independently       Knowledge and understanding of Target Heart Rate Range (THRR)  Yes       Intervention  Provide education and explanation of THRR including how the numbers were predicted and where they are located for reference       Expected Outcomes  Short Term: Able to use daily as guideline for intensity in rehab;Long Term: Able to use THRR to govern intensity when exercising independently       Able to check pulse independently  Yes       Intervention  Provide education and demonstration on how to check pulse in carotid and radial arteries.;Review the importance of being able to check your own pulse for safety during independent exercise       Expected Outcomes  Short Term: Able to explain why pulse checking is important during independent exercise;Long Term: Able to check pulse independently and accurately       Understanding of Exercise Prescription  Yes       Intervention  Provide education, explanation, and written materials on patient's individual exercise prescription       Expected Outcomes  Short Term: Able to explain program exercise prescription;Long Term: Able to explain home exercise prescription to exercise independently          Exercise Goals Re-Evaluation : Exercise Goals Re-Evaluation    Row Name 07/17/19 0939             Exercise Goal Re-Evaluation   Exercise Goals  Review  Increase Physical Activity;Increase Strength and Stamina;Understanding of Exercise Prescription;Knowledge and understanding of Target  Heart Rate Range (THRR)       Comments  This is patients first week. Will progress in the next 2 week cycle. He is still very weak. He tries very hard to do the exercises. He is still coming in with his walker.       Expected Outcomes  To gain strength so that he does not need the walker and to possibily get back to work.           Discharge Exercise Prescription (Final Exercise Prescription Changes):   Nutrition:  Target Goals: Understanding of nutrition guidelines, daily intake of sodium 1500mg , cholesterol 200mg , calories 30% from fat and 7% or less from saturated fats, daily to have 5 or more servings of fruits and vegetables.  Biometrics: Pre Biometrics - 06/23/19 1244      Pre Biometrics   Height  5\' 8"  (1.727 m)    Weight  114.7 kg    Waist Circumference  51 inches    Hip Circumference  48 inches    Waist to Hip Ratio  1.06 %    BMI (Calculated)  38.45    Triceps Skinfold  15 mm    % Body Fat  36.7 %    Grip Strength  17.2 kg    Flexibility  0 in    Single Leg Stand  2.25 seconds        Nutrition Therapy Plan and Nutrition Goals: Nutrition Therapy & Goals - 06/23/19 1249      Nutrition Therapy   RD appointment deferred  Yes      Personal Nutrition Goals   Personal Goal #2  Patient is working at eating heart healthy diet. He east low sodium diet as well. Handout given on how to make heart healthy choices.    Additional Goals?  No      Intervention Plan   Intervention  Nutrition handout(s) given to patient.       Nutrition Assessments: Nutrition Assessments - 06/23/19 1250      MEDFICTS Scores   Pre Score  6       Nutrition Goals Re-Evaluation:   Nutrition Goals Discharge (Final Nutrition Goals Re-Evaluation):   Psychosocial: Target Goals: Acknowledge presence or absence of significant depression and/or  stress, maximize coping skills, provide positive support system. Participant is able to verbalize types and ability to use techniques and skills needed for reducing stress and depression.  Initial Review & Psychosocial Screening: Initial Psych Review & Screening - 06/23/19 1247      Initial Review   Current issues with  None Identified      Family Dynamics   Good Support System?  Yes      Barriers   Psychosocial barriers to participate in program  There are no identifiable barriers or psychosocial needs.      Screening Interventions   Interventions  Encouraged to exercise       Quality of Life Scores: Quality of Life - 06/23/19 1248      Quality of Life   Select  Quality of Life      Quality of Life Scores   Health/Function Pre  22.77 %    Socioeconomic Pre  28.29 %    Psych/Spiritual Pre  27.43 %    Family Pre  27.6 %    GLOBAL Pre  25.57 %      Scores of 19 and below usually indicate a poorer quality of life in these areas.  A difference of  2-3 points is a  clinically meaningful difference.  A difference of 2-3 points in the total score of the Quality of Life Index has been associated with significant improvement in overall quality of life, self-image, physical symptoms, and general health in studies assessing change in quality of life.  PHQ-9: Recent Review Flowsheet Data    Depression screen [facility] 2/9 05/31/2019 04/24/2019   Decreased Interest 0 0   Down, Depressed, Hopeless 0 0   PHQ - 2 Score 0 0   Altered sleeping - 1   Tired, decreased energy - 1   Change in appetite - 0   Feeling bad or failure about yourself  - 0   Trouble concentrating - 0   Moving slowly or fidgety/restless - 0   Suicidal thoughts - 0   PHQ-9 Score - 2   Difficult doing work/chores - Not difficult at all     Interpretation of Total Score  Total Score Depression Severity:  1-4 = Minimal depression, 5-9 = Mild depression, 10-14 = Moderate depression, 15-19 = Moderately severe depression,  20-27 = Severe depression   Psychosocial Evaluation and Intervention: Psychosocial Evaluation - 06/23/19 1247      Psychosocial Evaluation & Interventions   Interventions  Encouraged to exercise with the program and follow exercise prescription    Continue Psychosocial Services   No Follow up required       Psychosocial Re-Evaluation: Psychosocial Re-Evaluation    Row Name 07/12/19 1504             Psychosocial Re-Evaluation   Current issues with  Current Anxiety/Panic       Comments  Patient has GAD managed with Citalopram 10 mg daily and Lorazepam 0.5 mg BID prn. He says his anxiety is controlled. His initial QOL score was 25.72 and his PHQ-9 score was 2. Will continue to monitor.       Expected Outcomes  Patient's GAD will continue to be managed with no additional psychosocial issues identified at discharge.       Interventions  Stress management education;Encouraged to attend Cardiac Rehabilitation for the exercise;Relaxation education       Continue Psychosocial Services   Follow up required by staff          Psychosocial Discharge (Final Psychosocial Re-Evaluation): Psychosocial Re-Evaluation - 07/12/19 1504      Psychosocial Re-Evaluation   Current issues with  Current Anxiety/Panic    Comments  Patient has GAD managed with Citalopram 10 mg daily and Lorazepam 0.5 mg BID prn. He says his anxiety is controlled. His initial QOL score was 25.72 and his PHQ-9 score was 2. Will continue to monitor.    Expected Outcomes  Patient's GAD will continue to be managed with no additional psychosocial issues identified at discharge.    Interventions  Stress management education;Encouraged to attend Cardiac Rehabilitation for the exercise;Relaxation education    Continue Psychosocial Services   Follow up required by staff       Vocational Rehabilitation: Provide vocational rehab assistance to qualifying candidates.   Vocational Rehab Evaluation & Intervention: Vocational Rehab -  06/23/19 1252      Initial Vocational Rehab Evaluation & Intervention   Assessment shows need for Vocational Rehabilitation  No       Education: Education Goals: Education classes will be provided on a weekly basis, covering required topics. Participant will state understanding/return demonstration of topics presented.  Learning Barriers/Preferences: Learning Barriers/Preferences - 06/23/19 1251      Learning Barriers/Preferences   Learning Barriers  None  Learning Preferences  Individual Instruction;Group Instruction;Pictoral;Video;Computer/Internet       Education Topics: Hypertension, Hypertension Reduction -Define heart disease and high blood pressure. Discus how high blood pressure affects the body and ways to reduce high blood pressure.   Exercise and Your Heart -Discuss why it is important to exercise, the FITT principles of exercise, normal and abnormal responses to exercise, and how to exercise safely.   Angina -Discuss definition of angina, causes of angina, treatment of angina, and how to decrease risk of having angina.   Cardiac Medications -Review what the following cardiac medications are used for, how they affect the body, and side effects that may occur when taking the medications.  Medications include Aspirin, Beta blockers, calcium channel blockers, ACE Inhibitors, angiotensin receptor blockers, diuretics, digoxin, and antihyperlipidemics.   Congestive Heart Failure -Discuss the definition of CHF, how to live with CHF, the signs and symptoms of CHF, and how keep track of weight and sodium intake.   CARDIAC REHAB PHASE II EXERCISE from 07/12/2019 in [place]  Date  07/12/19  Educator  Etheleen Mayhew  Instruction Review Code  2- Demonstrated Understanding      Heart Disease and Intimacy -Discus the effect sexual activity has on the heart, how changes occur during intimacy as we age, and safety during sexual activity.   Smoking  Cessation / COPD -Discuss different methods to quit smoking, the health benefits of quitting smoking, and the definition of COPD.   Nutrition I: Fats -Discuss the types of cholesterol, what cholesterol does to the heart, and how cholesterol levels can be controlled.   Nutrition II: Labels -Discuss the different components of food labels and how to read food label   Heart Parts/Heart Disease and PAD -Discuss the anatomy of the heart, the pathway of blood circulation through the heart, and these are affected by heart disease.   Stress I: Signs and Symptoms -Discuss the causes of stress, how stress may lead to anxiety and depression, and ways to limit stress.   Stress II: Relaxation -Discuss different types of relaxation techniques to limit stress.   Warning Signs of Stroke / TIA -Discuss definition of a stroke, what the signs and symptoms are of a stroke, and how to identify when someone is having stroke.   Knowledge Questionnaire Score: Knowledge Questionnaire Score - 06/23/19 1251      Knowledge Questionnaire Score   Pre Score  21/24       Core Components/Risk Factors/Patient Goals at Admission: Personal Goals and Risk Factors at Admission - 06/23/19 1252      Core Components/Risk Factors/Patient Goals on Admission    Weight Management  Weight Maintenance    Personal Goal Other  Yes    Personal Goal  Get stringer and become more independent    Intervention  Since [facility] is closed at this time. We have given Mr. Wyndham a home exercise plan to log his activities in the [employer] app 3 x week. Once we reopen we will call him to bring in the [facility] gym to do 3 x week in gym and 2 x week at home.    Expected Outcomes  To get stronger and to become more independent       Core Components/Risk Factors/Patient Goals Review:    Core Components/Risk Factors/Patient Goals at Discharge (Final Review):    ITP Comments: ITP Comments    Row Name 06/23/19 1159 07/12/19 1503          ITP Comments  Patient  came in today for his orientation/assessment to get started with the virtual program until we reopn. He has had stents in the past in 2001 and 2011. He also had a stroke right after his CABG surgery.  Patient is new to the program completing 2 sessions. He plans to compete the program. Will continue to monitor for progress.         Comments: ITP REVIEW Patient is new to the program completing 2 sessions. He plans to compete the program. Will continue to monitor for progress

## 2019-07-13 DIAGNOSIS — I63331 Cerebral infarction due to thrombosis of right posterior cerebral artery: Secondary | ICD-10-CM | POA: Diagnosis not present

## 2019-07-13 DIAGNOSIS — Z951 Presence of aortocoronary bypass graft: Secondary | ICD-10-CM | POA: Diagnosis not present

## 2019-07-13 DIAGNOSIS — I69354 Hemiplegia and hemiparesis following cerebral infarction affecting left non-dominant side: Secondary | ICD-10-CM | POA: Diagnosis not present

## 2019-07-13 DIAGNOSIS — Z7409 Other reduced mobility: Secondary | ICD-10-CM | POA: Diagnosis not present

## 2019-07-14 ENCOUNTER — Other Ambulatory Visit: Payer: Self-pay

## 2019-07-14 ENCOUNTER — Encounter (HOSPITAL_COMMUNITY)
Admission: RE | Admit: 2019-07-14 | Discharge: 2019-07-14 | Disposition: A | Discharge status: Home / Self Care | Payer: Medicare Other | Source: Ambulatory Visit | Attending: Cardiovascular Disease | Admitting: Cardiovascular Disease

## 2019-07-14 DIAGNOSIS — Z951 Presence of aortocoronary bypass graft: Secondary | ICD-10-CM

## 2019-07-14 NOTE — Progress Notes (Signed)
Daily Session Note  Patient Details  Name: Martin [name] MRN: [id number] Date of Birth: 1955-11-25 Referring Provider:     CARDIAC REHAB PHASE II ORIENTATION from 06/23/2019 in [place]  Referring Provider  Hochrein      Encounter Date: 07/14/2019  Check In: Session Check In - 07/14/19 1100      Check-In   Supervising physician immediately available to respond to emergencies  See telemetry face sheet for immediately available MD    Location  [facility]    Staff Present  Russella Dar, MS, EP, [facility], Exercise Physiologist;[place] Drew Wynetta Emery, RN, BSN;Other    Virtual Visit  No    Medication changes reported      No    Fall or balance concerns reported     Yes    Comments  stroke / using walker    Tobacco Cessation  No Change    Warm-up and Cool-down  Performed as group-led instruction    Resistance Training Performed  Yes    VAD Patient?  No    PAD/SET Patient?  No      Pain Assessment   Currently in Pain?  No/denies    Pain Score  0-No pain    Multiple Pain Sites  No       Capillary Blood Glucose: No results found for this or any previous visit (from the past 24 hour(s)).    Social History   Tobacco Use  Smoking Status Former Smoker  . Packs/day: 1.00  . Years: 35.00  . Pack years: 35.00  . Types: Cigarettes  . Start date: 09/18/1967  . Quit date: 06/09/1999  . Years since quitting: 20.1  Smokeless Tobacco Never Used    Goals Met:  Independence with exercise equipment Exercise tolerated well Personal goals reviewed No report of cardiac concerns or symptoms Strength training completed today  Goals Unmet:  Not Applicable  Comments: check out 12:00   Dr. Kate Sable is Medical Director for [facility] and Pulmonary Rehab.

## 2019-07-17 ENCOUNTER — Other Ambulatory Visit: Payer: Self-pay

## 2019-07-17 ENCOUNTER — Encounter (HOSPITAL_COMMUNITY): Payer: Medicare Other

## 2019-07-17 ENCOUNTER — Encounter (HOSPITAL_COMMUNITY)
Admission: RE | Admit: 2019-07-17 | Discharge: 2019-07-17 | Disposition: A | Discharge status: Home / Self Care | Payer: Medicare Other | Source: Ambulatory Visit | Attending: Cardiovascular Disease | Admitting: Cardiovascular Disease

## 2019-07-17 DIAGNOSIS — Z951 Presence of aortocoronary bypass graft: Secondary | ICD-10-CM

## 2019-07-17 DIAGNOSIS — R609 Edema, unspecified: Secondary | ICD-10-CM | POA: Diagnosis not present

## 2019-07-17 DIAGNOSIS — R0602 Shortness of breath: Secondary | ICD-10-CM | POA: Diagnosis not present

## 2019-07-17 NOTE — Progress Notes (Signed)
Daily Session Note  Patient Details  Name: Martin [name] MRN: [id number] Date of Birth: [birth year] Referring Provider:     CARDIAC REHAB PHASE II ORIENTATION from 06/23/2019 in [place]  Referring Provider  Hochrein      Encounter Date: 07/17/2019  Check In: Session Check In - 07/17/19 1124      Check-In   Supervising physician immediately available to respond to emergencies  See telemetry face sheet for immediately available MD    Location  [facility]    Staff Present  Russella Dar, MS, EP, [facility], Exercise Physiologist;Debra Wynetta Emery, RN, BSN    Virtual Visit  No    Medication changes reported      No    Fall or balance concerns reported     No    Tobacco Cessation  No Change    Warm-up and Cool-down  Performed as group-led instruction    Resistance Training Performed  Yes    VAD Patient?  No    PAD/SET Patient?  No      Pain Assessment   Currently in Pain?  No/denies    Pain Score  0-No pain    Multiple Pain Sites  No       Capillary Blood Glucose: No results found for this or any previous visit (from the past 24 hour(s)).    Social History   Tobacco Use  Smoking Status Former Smoker  . Packs/day: 1.00  . Years: 35.00  . Pack years: 35.00  . Types: Cigarettes  . Start date: 09/18/1967  . Quit date: 06/09/1999  . Years since quitting: 20.1  Smokeless Tobacco Never Used    Goals Met:  Independence with exercise equipment Exercise tolerated well Personal goals reviewed No report of cardiac concerns or symptoms Strength training completed today  Goals Unmet:  Not Applicable  Comments: Check out: 1200   Dr. Kate Sable is Medical Director for [place] and Pulmonary Rehab.

## 2019-07-18 DIAGNOSIS — H2513 Age-related nuclear cataract, bilateral: Secondary | ICD-10-CM | POA: Diagnosis not present

## 2019-07-18 DIAGNOSIS — H47011 Ischemic optic neuropathy, right eye: Secondary | ICD-10-CM | POA: Diagnosis not present

## 2019-07-19 ENCOUNTER — Telehealth: Payer: Medicare Other | Admitting: Cardiothoracic Surgery

## 2019-07-19 ENCOUNTER — Other Ambulatory Visit: Payer: Self-pay

## 2019-07-19 ENCOUNTER — Telehealth: Payer: Self-pay | Admitting: Cardiothoracic Surgery

## 2019-07-19 ENCOUNTER — Encounter (HOSPITAL_COMMUNITY): Payer: Medicare Other

## 2019-07-19 NOTE — Telephone Encounter (Signed)
[address]       [place] [address]             [phone]     CARDIOTHORACIC SURGERY TELEPHONE VIRTUAL OFFICE NOTE  Referring Provider is No ref. provider found Primary Cardiologist is Kate Sable, MD PCP is [name], MD   HPI:  I spoke with Martin [name] (DOB 10-29-1955 ) via telephone on 07/19/2019 at 4:54 PM and verified that I was speaking with the correct person using more than one form of identification.  We discussed the reason(s) for conducting our visit virtually instead of in-person.  The patient expressed understanding the circumstances and agreed to proceed as described.  I spoke with the patient over the phone to assess his progress since I saw him last in the office about 6 weeks ago.  Patient had multivessel CABG August 2020.  He has history of PAF and had a perioperative stroke with left-sided weakness which has resolved with physical therapy.  He is on Eliquis and low-dose amiodarone and has appointment to see Dr. Crissie Sickles in mid February.  He is now progressing well with phase 2 cardiac rehab at [facility] like he has very little deficit and should be ready to drive and start working soon.  He denies any problems with the surgical incisions. He denies angina He denies ankle edema He denies shortness of breath    Current Outpatient Medications  Medication Sig Dispense Refill  . acetaminophen (TYLENOL) 325 MG tablet Take 1-2 tablets (325-650 mg total) by mouth every 4 (four) hours as needed for mild pain.    Marland Kitchen allopurinol (ZYLOPRIM) 300 MG tablet Take 300 mg by mouth every morning.     . Amino Acids-Protein Hydrolys (FEEDING SUPPLEMENT, PRO-STAT SUGAR FREE 64,) LIQD Take 30 mLs by mouth 2 (two) times daily. 887 mL 0  . amiodarone (PACERONE) 200 MG tablet Take 1 tablet (200 mg total) by mouth daily. 90 tablet 3  . amLODipine (NORVASC) 5 MG tablet Take 1 tablet (5 mg total) by mouth daily. 90 tablet 3  .  aspirin EC 81 MG EC tablet Take 1 tablet (81 mg total) by mouth daily. 100 tablet 1  . atorvastatin (LIPITOR) 80 MG tablet TAKE 1 TABLET BY MOUTH EVERY DAY 90 tablet 1  . Cholecalciferol (VITAMIN D-3) 1000 UNITS CAPS Take 1,000 Units by mouth every morning.     . citalopram (CELEXA) 10 MG tablet TAKE 1 TABLET BY MOUTH EVERY DAY 90 tablet 1  . colchicine 0.6 MG tablet Take 1 tablet (0.6 mg total) by mouth daily at 6 PM. 30 tablet 1  . Cyanocobalamin (VITAMIN B-12) 2500 MCG SUBL Place 2,500 mcg under the tongue every morning.     . diclofenac sodium (VOLTAREN) 1 % GEL Apply 2 g topically 4 (four) times daily. 350 g 1  . docusate sodium (COLACE) 100 MG capsule Take 2 capsules (200 mg total) by mouth daily. 60 capsule 0  . ELIQUIS 5 MG TABS tablet TAKE 1 TABLET BY MOUTH TWICE A DAY 180 tablet 2  . fluticasone (FLONASE) 50 MCG/ACT nasal spray Place 2 sprays into the nose daily.      . folic acid (FOLVITE) 1 MG tablet TAKE ONE TABLET BY MOUTH EVERY DAY (Patient taking differently: Take 1 mg by mouth every morning. ) 30 tablet 6  . furosemide (LASIX) 20 MG tablet Take 1 tablet (20 mg total) by mouth 2 (two) times  daily. 180 tablet 3  . loratadine (CLARITIN) 10 MG tablet Take 10 mg by mouth every morning.     Marland Kitchen LORazepam (ATIVAN) 0.5 MG tablet Take 0.5 mg by mouth 2 (two) times daily as needed.    . meclizine (ANTIVERT) 32 MG tablet Take 32 mg by mouth daily as needed for dizziness.     . metoprolol tartrate (LOPRESSOR) 25 MG tablet Take 1 tablet (25 mg total) by mouth every morning. 90 tablet 3  . mometasone-formoterol (DULERA) 200-5 MCG/ACT AERO Inhale 2 puffs into the lungs 2 (two) times daily. 13 g 0  . nitroGLYCERIN (NITROSTAT) 0.4 MG SL tablet PLACE 1 TABLET (0.4 MG TOTAL) UNDER THE TONGUE EVERY 5 (FIVE) MINUTES AS NEEDED. (Patient taking differently: Place 0.4 mg under the tongue every 5 (five) minutes as needed for chest pain. ) 25 tablet 3  . pantoprazole (PROTONIX) 40 MG tablet Take 1 tablet (40  mg total) by mouth daily. 30 tablet 0  . polyethylene glycol (MIRALAX / GLYCOLAX) packet Take 17 g by mouth 3 (three) times a week.     . potassium chloride SA (KLOR-CON M15) 15 MEQ tablet Take 2 tablets (30 mEq total) by mouth daily. 180 tablet 3  . tamsulosin (FLOMAX) 0.4 MG CAPS capsule Take 1 capsule (0.4 mg total) by mouth 2 (two) times daily. 60 capsule 0  . thiamine 100 MG tablet Take 1 tablet (100 mg total) by mouth daily. 30 tablet 0  . traZODone (DESYREL) 100 MG tablet TAKE 1 TABLET (100 MG TOTAL) BY MOUTH AT BEDTIME AS NEEDED FOR SLEEP. 90 tablet 1   No current facility-administered medications for this visit.     Diagnostic Tests:  No recent x-rays   Impression: Excellent recovery following urgent multivessel CABG 4 months ago complicated with perioperative stroke and left-sided weakness which has resolved.  Plan: Continue current medications which are monitored by his medical physicians. I will have the patient return for a face-to-face office visit for final assessment in about 8 to 10 weeks.  He has no activity restrictions at 4 months after CABG.    I discussed limitations of evaluation and management via telephone.  The patient was advised to call back for repeat telephone consultation or to seek an in-person evaluation if questions arise or the patient's clinical condition changes in any significant manner.  I spent in excess of 5 minutes of non-face-to-face time during the conduct of this telephone virtual office consultation.  Level 1  (99441)             5-10 minutes Level 2  (99442)            11-20 minutes Level 3  (99443)            21-30 minutes   07/19/2019 4:54 PM

## 2019-07-20 DIAGNOSIS — R609 Edema, unspecified: Secondary | ICD-10-CM | POA: Diagnosis not present

## 2019-07-20 DIAGNOSIS — R0602 Shortness of breath: Secondary | ICD-10-CM | POA: Diagnosis not present

## 2019-07-21 ENCOUNTER — Encounter (HOSPITAL_COMMUNITY): Payer: Medicare Other

## 2019-07-24 ENCOUNTER — Other Ambulatory Visit: Payer: Self-pay

## 2019-07-24 ENCOUNTER — Encounter (HOSPITAL_COMMUNITY)
Admission: RE | Admit: 2019-07-24 | Discharge: 2019-07-24 | Disposition: A | Discharge status: Home / Self Care | Payer: Medicare Other | Source: Ambulatory Visit | Attending: Cardiovascular Disease | Admitting: Cardiovascular Disease

## 2019-07-24 DIAGNOSIS — Z951 Presence of aortocoronary bypass graft: Secondary | ICD-10-CM | POA: Diagnosis not present

## 2019-07-24 NOTE — Progress Notes (Signed)
Daily Session Note  Patient Details  Name: Martin [name] MRN: [id number] Date of Birth: [birth year] Referring Provider:     CARDIAC REHAB [name] from 06/23/2019 in [name]  Referring Provider  Hochrein      Encounter Date: 07/24/2019  Check In: Session Check In - 07/24/19 1100      Check-In   Supervising physician immediately available to respond to emergencies  See telemetry face sheet for immediately available MD    Location  [facility]    Staff Present  Martin Dar, MS, EP, [facility], Exercise Physiologist;Martin Wynetta Emery, RN, BSN;Other    Virtual Visit  No    Medication changes reported      No    Fall or balance concerns reported     Yes    Comments  stroke/ using walker    Tobacco Cessation  No Change    Warm-up and Cool-down  Performed as group-led instruction    Resistance Training Performed  Yes    VAD Patient?  No    PAD/SET Patient?  No      Pain Assessment   Currently in Pain?  No/denies    Pain Score  0-No pain    Multiple Pain Sites  No       Capillary Blood Glucose: No results found for this or any previous visit (from the past 24 hour(s)).    Social History   Tobacco Use  Smoking Status Former Smoker  . Packs/day: 1.00  . Years: 35.00  . Pack years: 35.00  . Types: Cigarettes  . Start date: 09/18/1967  . Quit date: 06/09/1999  . Years since quitting: 20.1  Smokeless Tobacco Never Used    Goals Met:  Independence with exercise equipment Exercise tolerated well Personal goals reviewed No report of cardiac concerns or symptoms Strength training completed today  Goals Unmet:  Not Applicable  Comments: check out 12:00   Dr. Kate Sable is Medical Director for [name] and Pulmonary Rehab.

## 2019-07-26 ENCOUNTER — Encounter (HOSPITAL_COMMUNITY)
Admission: RE | Admit: 2019-07-26 | Discharge: 2019-07-26 | Disposition: A | Discharge status: Home / Self Care | Payer: Medicare Other | Source: Ambulatory Visit | Attending: Cardiovascular Disease | Admitting: Cardiovascular Disease

## 2019-07-26 ENCOUNTER — Other Ambulatory Visit: Payer: Self-pay

## 2019-07-26 DIAGNOSIS — Z951 Presence of aortocoronary bypass graft: Secondary | ICD-10-CM | POA: Diagnosis not present

## 2019-07-26 NOTE — Progress Notes (Signed)
Daily Session Note  Patient Details  Name: Martin [name] MRN: [id number] Date of Birth: 1955/10/31 Referring Provider:     CARDIAC REHAB PHASE II ORIENTATION from 06/23/2019 in [place]  Referring Provider  Hochrein      Encounter Date: 07/26/2019  Check In: Session Check In - 07/26/19 1058      Check-In   Supervising physician immediately available to respond to emergencies  See telemetry face sheet for immediately available MD    Location  [facility]    Staff Present  Aundra Dubin, RN, BSN;Other    Virtual Visit  No    Medication changes reported      No    Fall or balance concerns reported     Yes    Comments  stroke / using walker    Tobacco Cessation  No Change    Warm-up and Cool-down  Performed as group-led instruction    Resistance Training Performed  Yes    VAD Patient?  No    PAD/SET Patient?  No      Pain Assessment   Currently in Pain?  No/denies    Pain Score  0-No pain    Multiple Pain Sites  No       Capillary Blood Glucose: No results found for this or any previous visit (from the past 24 hour(s)).    Social History   Tobacco Use  Smoking Status Former Smoker  . Packs/day: 1.00  . Years: 35.00  . Pack years: 35.00  . Types: Cigarettes  . Start date: 09/18/1967  . Quit date: 06/09/1999  . Years since quitting: 20.1  Smokeless Tobacco Never Used    Goals Met:  Independence with exercise equipment Exercise tolerated well Personal goals reviewed No report of cardiac concerns or symptoms Strength training completed today  Goals Unmet:  Not Applicable  Comments: check out 12:00   Dr. Kate Sable is Medical Director for [place] and Pulmonary Rehab.

## 2019-07-28 ENCOUNTER — Encounter (HOSPITAL_COMMUNITY)
Admission: RE | Admit: 2019-07-28 | Discharge: 2019-07-28 | Disposition: A | Discharge status: Home / Self Care | Payer: Medicare Other | Source: Ambulatory Visit | Attending: Cardiovascular Disease | Admitting: Cardiovascular Disease

## 2019-07-28 ENCOUNTER — Other Ambulatory Visit: Payer: Self-pay

## 2019-07-28 ENCOUNTER — Encounter: Payer: Self-pay | Admitting: Internal Medicine

## 2019-07-28 ENCOUNTER — Telehealth (INDEPENDENT_AMBULATORY_CARE_PROVIDER_SITE_OTHER): Payer: Medicare Other | Admitting: Internal Medicine

## 2019-07-28 VITALS — BP 159/95 | HR 60 | Ht 68.0 in | Wt 247.0 lb

## 2019-07-28 DIAGNOSIS — I2510 Atherosclerotic heart disease of native coronary artery without angina pectoris: Secondary | ICD-10-CM

## 2019-07-28 DIAGNOSIS — I4819 Other persistent atrial fibrillation: Secondary | ICD-10-CM

## 2019-07-28 DIAGNOSIS — I25708 Atherosclerosis of coronary artery bypass graft(s), unspecified, with other forms of angina pectoris: Secondary | ICD-10-CM

## 2019-07-28 DIAGNOSIS — Z951 Presence of aortocoronary bypass graft: Secondary | ICD-10-CM | POA: Diagnosis not present

## 2019-07-28 NOTE — Progress Notes (Signed)
Electrophysiology TeleHealth Note   Due to national recommendations of social distancing due to COVID 19, an audio/video telehealth visit is felt to be most appropriate for this patient at this time.  See MyChart message from today for the patient's consent to telehealth for [facility].   Date:  07/28/2019   ID:  Martin [name], DOB [birth year], MRN [id number]  Location: patient's home  Provider location: [address] [place]  Evaluation Performed: Follow-up visit  PCP:  [name], MD  Cardiologist:  Kate Sable, MD  Electrophysiologist:  Dr Lovena Le  Chief Complaint:  "I've been doing alright."  History of Present Illness:    Martin [name] is a 64 y.o. male who presents via audio/video conferencing for a telehealth visit today. He has a h/o atrial fib and CAD and has undergone CABG complicated by a stroke. He has been participating in rehab. He denies chest pain or sob. He is bothered by the high cost of Eliquis. He notes that his bp is up in the early morning then goes back to normal. He has managed to avoid Covid and has been vaccinated once. Today, he denies The patient is otherwise without complaint today.  The patient denies symptoms of fevers, chills, cough, or new SOB worrisome for COVID 19.  Past Medical History:  Diagnosis Date  . Atrial fibrillation ([place])    a. dx 10/2017. b. s/p DCCV on 11/19/2017 with successful conversion to NSR but back in AFIB 3 weeks later  . CAD (coronary artery disease)    a. s/p PCI to LAD 2001. b. s/p stenting to the RCA and mid LAD December 2011, residual distal RCA disease treated medically. b. nuc 02/2013 abnormal with fixed defect seen in inferoapical, mid-inferior, and basal inferior walls, indicative of myocardial scar, no evidence of ischemia, EF 41% (Ef 55-60% by echo same time).  . Carotid artery disease ([place])    carotid Doppler course left side 5069% stenosis  . Dysrhythmia    AFib  . Habitual alcohol  use   . Hyperlipidemia   . Hypertension   . Low back pain   . OSA on CPAP   . Osteoarthritis   . Polysubstance abuse ([place])    use including marijuana and vicodin,which he obtained from the street  . PVC's (premature ventricular contractions)    status post Holter monitor normal sinus rhythm otherwise asymptomatic PVCs.  . Seasonal allergies     Past Surgical History:  Procedure Laterality Date  . CARDIOVERSION N/A 11/19/2017   Procedure: CARDIOVERSION;  Surgeon: Herminio Commons, MD;  [facility];  Service: Cardiovascular;  Laterality: N/A;  . CORONARY ANGIOPLASTY WITH STENT PLACEMENT  2011  . CORONARY ARTERY BYPASS GRAFT N/A 03/10/2019   Procedure: CORONARY ARTERY BYPASS GRAFTING (CABG)X 3 Using Left Internal Mammary Artery and Right Saphenous Vein Grafts;  Surgeon: Ivin Poot, MD;  Location: [facility];  Service: Open Heart Surgery;  Laterality: N/A;  . LEFT HEART CATH AND CORONARY ANGIOGRAPHY N/A 03/08/2019   Procedure: LEFT HEART CATH AND CORONARY ANGIOGRAPHY;  Surgeon: Belva Crome, MD;  Location: [facility] CV LAB;  Service: Cardiovascular;  Laterality: N/A;  . TEE WITHOUT CARDIOVERSION N/A 03/10/2019   Procedure: TRANSESOPHAGEAL ECHOCARDIOGRAM (TEE);  Surgeon: Prescott Gum, Collier Salina, MD;  Location: [facility];  Service: Open Heart Surgery;  Laterality: N/A;  . TONSILLECTOMY      Current Outpatient Medications  Medication Sig Dispense Refill  . acetaminophen (TYLENOL) 325 MG  tablet Take 1-2 tablets (325-650 mg total) by mouth every 4 (four) hours as needed for mild pain.    Marland Kitchen allopurinol (ZYLOPRIM) 300 MG tablet Take 300 mg by mouth every morning.     . Amino Acids-Protein Hydrolys (FEEDING SUPPLEMENT, PRO-STAT SUGAR FREE 64,) LIQD Take 30 mLs by mouth 2 (two) times daily. 887 mL 0  . amiodarone (PACERONE) 200 MG tablet Take 1 tablet (200 mg total) by mouth daily. 90 tablet 3  . amLODipine (NORVASC) 5 MG tablet Take 1 tablet (5 mg total) by mouth daily. 90 tablet 3  .  aspirin EC 81 MG EC tablet Take 1 tablet (81 mg total) by mouth daily. 100 tablet 1  . atorvastatin (LIPITOR) 80 MG tablet TAKE 1 TABLET BY MOUTH EVERY DAY 90 tablet 1  . Cholecalciferol (VITAMIN D-3) 1000 UNITS CAPS Take 1,000 Units by mouth every morning.     . citalopram (CELEXA) 10 MG tablet TAKE 1 TABLET BY MOUTH EVERY DAY 90 tablet 1  . colchicine 0.6 MG tablet Take 1 tablet (0.6 mg total) by mouth daily at 6 PM. 30 tablet 1  . Cyanocobalamin (VITAMIN B-12) 2500 MCG SUBL Place 2,500 mcg under the tongue every morning.     . diclofenac sodium (VOLTAREN) 1 % GEL Apply 2 g topically 4 (four) times daily. 350 g 1  . docusate sodium (COLACE) 100 MG capsule Take 2 capsules (200 mg total) by mouth daily. 60 capsule 0  . ELIQUIS 5 MG TABS tablet TAKE 1 TABLET BY MOUTH TWICE A DAY 180 tablet 2  . fluticasone (FLONASE) 50 MCG/ACT nasal spray Place 2 sprays into the nose daily.      . folic acid (FOLVITE) 1 MG tablet TAKE ONE TABLET BY MOUTH EVERY DAY (Patient taking differently: Take 1 mg by mouth every morning. ) 30 tablet 6  . furosemide (LASIX) 20 MG tablet Take 1 tablet (20 mg total) by mouth 2 (two) times daily. 180 tablet 3  . loratadine (CLARITIN) 10 MG tablet Take 10 mg by mouth every morning.     Marland Kitchen LORazepam (ATIVAN) 0.5 MG tablet Take 0.5 mg by mouth 2 (two) times daily as needed.    . meclizine (ANTIVERT) 32 MG tablet Take 32 mg by mouth daily as needed for dizziness.     . metoprolol tartrate (LOPRESSOR) 25 MG tablet Take 1 tablet (25 mg total) by mouth every morning. 90 tablet 3  . nitroGLYCERIN (NITROSTAT) 0.4 MG SL tablet PLACE 1 TABLET (0.4 MG TOTAL) UNDER THE TONGUE EVERY 5 (FIVE) MINUTES AS NEEDED. (Patient taking differently: Place 0.4 mg under the tongue every 5 (five) minutes as needed for chest pain. ) 25 tablet 3  . pantoprazole (PROTONIX) 40 MG tablet Take 1 tablet (40 mg total) by mouth daily. 30 tablet 0  . polyethylene glycol (MIRALAX / GLYCOLAX) packet Take 17 g by mouth 3  (three) times a week.     . potassium chloride SA (KLOR-CON M15) 15 MEQ tablet Take 2 tablets (30 mEq total) by mouth daily. 180 tablet 3  . tamsulosin (FLOMAX) 0.4 MG CAPS capsule Take 1 capsule (0.4 mg total) by mouth 2 (two) times daily. 60 capsule 0  . thiamine 100 MG tablet Take 1 tablet (100 mg total) by mouth daily. 30 tablet 0  . traZODone (DESYREL) 100 MG tablet TAKE 1 TABLET (100 MG TOTAL) BY MOUTH AT BEDTIME AS NEEDED FOR SLEEP. 90 tablet 1   No current facility-administered medications for this visit.  Allergies:   Penicillins   Social History:  The patient  reports that he quit smoking about 20 years ago. His smoking use included cigarettes. He started smoking about 51 years ago. He has a 35.00 pack-year smoking history. He has never used smokeless tobacco. He reports current alcohol use of about 42.0 standard drinks of alcohol per week. He reports current drug use. Drug: Marijuana.   Family History:  The patient's  family history includes Healthy in his son; Heart attack in his brother and father; Other in his mother; Stroke in his mother.   ROS:  Please see the history of present illness.   All other systems are personally reviewed and negative.    Exam:    Vital Signs:  BP (!) 159/95   Pulse 60   Ht 5\' 8"  (1.727 m)   Wt 247 lb (112 kg)   BMI 37.56 kg/m   Well appearing, alert and conversant, regular work of breathing,  good skin color Eyes- anicteric, neuro- grossly intact, skin- no apparent rash or lesions or cyanosis, mouth- oral mucosa is pink   Labs/Other Tests and Data Reviewed:    Recent Labs: 03/12/2019: TSH 10.690 03/17/2019: Magnesium 1.8 03/28/2019: ALT 37 04/06/2019: B Natriuretic Peptide 196.9 04/10/2019: BUN 14; Creatinine, Ser 0.79; Hemoglobin 10.7; Platelets 177; Potassium 3.6; Sodium 141   Wt Readings from Last 3 Encounters:  07/28/19 247 lb (112 kg)  06/23/19 252 lb 12.8 oz (114.7 kg)  06/13/19 259 lb (117.5 kg)        ASSESSMENT &  PLAN:    1.  Persistent atrial fib - his rate appears to be well controlled. He will continue his AV nodal blocking drugs.  2. Coags - he has not had any bleeding on eliquis. He is bothered by the cost and I have asked him to check on samples in our office. 3. CAD - he has been to cardiac rehab and has no anginal symptoms after CABG 4. COVID 19 screen The patient denies symptoms of COVID 19 at this time.  The importance of social distancing was discussed today.  Follow-up:  10/21 Next remote: n/a  Current medicines are reviewed at length with the patient today.   The patient does not have concerns regarding his medicines.  The following changes were made today:  none  Labs/ tests ordered today include: none No orders of the defined types were placed in this encounter.    Patient Risk:  after full review of this patients clinical status, I feel that they are at moderate risk at this time.  Today, I have spent 15 minutes with the patient with telehealth technology discussing all of the above .    Signed, [name], MD  07/28/2019 8:31 AM     [place] [address] [phone] (office) [phone] (fax)

## 2019-07-28 NOTE — Patient Instructions (Signed)
Medication Instructions:  Your physician recommends that you continue on your current medications as directed. Please refer to the Current Medication list given to you today.   Labwork: NONE   Testing/Procedures: NONE   Follow-Up: Your physician wants you to follow-up in: October with Dr. Lovena Le. You will receive a reminder letter in the mail two months in advance. If you don't receive a letter, please call our office to schedule the follow-up appointment.   Any Other Special Instructions Will Be Listed Below (If Applicable).  You have been given samples of Eliquis    If you need a refill on your cardiac medications before your next appointment, please call your pharmacy.  Thank you for choosing [facility]!

## 2019-07-28 NOTE — Progress Notes (Signed)
Daily Session Note  Patient Details  Name: YARNELL ARVIDSON MRN: [id number] Date of Birth: September 13, 1955 Referring Provider:     CARDIAC REHAB [place] from 06/23/2019 in [place]  Referring Provider  Hochrein      Encounter Date: 07/28/2019  Check In: Session Check In - 07/28/19 1054      Check-In   Location  [facility]    Staff Present  Aundra Dubin, RN, BSN;Other    Virtual Visit  No    Medication changes reported      No    Fall or balance concerns reported     Yes    Comments  stroke / using walker    Tobacco Cessation  No Change    Warm-up and Cool-down  Performed as group-led instruction    Resistance Training Performed  Yes    VAD Patient?  No    PAD/SET Patient?  No      Pain Assessment   Currently in Pain?  No/denies    Pain Score  0-No pain    Multiple Pain Sites  No       Capillary Blood Glucose: No results found for this or any previous visit (from the past 24 hour(s)).    Social History   Tobacco Use  Smoking Status Former Smoker  . Packs/day: 1.00  . Years: 35.00  . Pack years: 35.00  . Types: Cigarettes  . Start date: 09/18/1967  . Quit date: 06/09/1999  . Years since quitting: 20.1  Smokeless Tobacco Never Used    Goals Met:  Independence with exercise equipment Exercise tolerated well Personal goals reviewed No report of cardiac concerns or symptoms Strength training completed today  Goals Unmet:  Not Applicable  Comments: check out 12:00   Dr. Kate Sable is Medical Director for [place] and Pulmonary Rehab.

## 2019-07-31 ENCOUNTER — Encounter (HOSPITAL_COMMUNITY)
Admission: RE | Admit: 2019-07-31 | Discharge: 2019-07-31 | Disposition: A | Discharge status: Home / Self Care | Payer: Medicare Other | Source: Ambulatory Visit | Attending: Cardiovascular Disease | Admitting: Cardiovascular Disease

## 2019-07-31 ENCOUNTER — Other Ambulatory Visit: Payer: Self-pay

## 2019-07-31 DIAGNOSIS — Z951 Presence of aortocoronary bypass graft: Secondary | ICD-10-CM | POA: Diagnosis not present

## 2019-07-31 NOTE — Progress Notes (Signed)
Daily Session Note  Patient Details  Name: Martin [name] MRN: [id number] Date of Birth: 1955-11-14 Referring Provider:     CARDIAC REHAB [place] from 06/23/2019 in [place]  Referring Provider  Hochrein      Encounter Date: 07/31/2019  Check In: Session Check In - 07/31/19 1100      Check-In   Supervising physician immediately available to respond to emergencies  See telemetry face sheet for immediately available MD    Location  [facility]    Staff Present  Russella Dar, MS, EP, [facility], Exercise Physiologist;Debra Wynetta Emery, RN, BSN;Other    Virtual Visit  No    Medication changes reported      No    Fall or balance concerns reported     Yes    Comments  stroke/ using walker    Tobacco Cessation  No Change    Warm-up and Cool-down  Performed as group-led instruction    Resistance Training Performed  Yes    VAD Patient?  No    PAD/SET Patient?  No      Pain Assessment   Currently in Pain?  No/denies    Pain Score  0-No pain    Multiple Pain Sites  No       Capillary Blood Glucose: No results found for this or any previous visit (from the past 24 hour(s)).    Social History   Tobacco Use  Smoking Status Former Smoker  . Packs/day: 1.00  . Years: 35.00  . Pack years: 35.00  . Types: Cigarettes  . Start date: 09/18/1967  . Quit date: 06/09/1999  . Years since quitting: 20.1  Smokeless Tobacco Never Used    Goals Met:  Independence with exercise equipment Exercise tolerated well Personal goals reviewed No report of cardiac concerns or symptoms Strength training completed today  Goals Unmet:  Not Applicable  Comments: check out 12:00   Dr. Kate Sable is Medical Director for [place] and Pulmonary Rehab.

## 2019-08-02 ENCOUNTER — Encounter (HOSPITAL_COMMUNITY)
Admission: RE | Admit: 2019-08-02 | Discharge: 2019-08-02 | Disposition: A | Discharge status: Home / Self Care | Payer: Medicare Other | Source: Ambulatory Visit | Attending: Cardiovascular Disease | Admitting: Cardiovascular Disease

## 2019-08-02 ENCOUNTER — Other Ambulatory Visit: Payer: Self-pay

## 2019-08-02 DIAGNOSIS — Z951 Presence of aortocoronary bypass graft: Secondary | ICD-10-CM | POA: Diagnosis not present

## 2019-08-02 NOTE — Progress Notes (Signed)
Daily Session Note  Patient Details  Name: Martin [name] MRN: [id number] Date of Birth: Aug 10, 1955 Referring Provider:     CARDIAC REHAB PHASE II ORIENTATION from 06/23/2019 in [facility]  Referring Provider  Hochrein      Encounter Date: 08/02/2019  Check In: Session Check In - 08/02/19 1100      Check-In   Supervising physician immediately available to respond to emergencies  See telemetry face sheet for immediately available MD    Location  [facility]    Staff Present  Russella Dar, MS, EP, [facility], Exercise Physiologist;Aiden Helzer Wynetta Emery, RN, BSN    Virtual Visit  No    Medication changes reported      No    Fall or balance concerns reported     Yes    Comments  Patient has a history of CVA and uses a walker.    Tobacco Cessation  No Change    Warm-up and Cool-down  Performed as group-led instruction    Resistance Training Performed  Yes    VAD Patient?  No    PAD/SET Patient?  No      Pain Assessment   Currently in Pain?  No/denies    Pain Score  0-No pain    Multiple Pain Sites  No       Capillary Blood Glucose: No results found for this or any previous visit (from the past 24 hour(s)).    Social History   Tobacco Use  Smoking Status Former Smoker  . Packs/day: 1.00  . Years: 35.00  . Pack years: 35.00  . Types: Cigarettes  . Start date: 09/18/1967  . Quit date: 06/09/1999  . Years since quitting: 20.1  Smokeless Tobacco Never Used    Goals Met:  Independence with exercise equipment Exercise tolerated well No report of cardiac concerns or symptoms Strength training completed today  Goals Unmet:  Not Applicable  Comments: Check out 1200.   Dr. Kate Sable is Medical Director for [facility] Card[facility] and Pulmonary Rehab.

## 2019-08-04 ENCOUNTER — Encounter (HOSPITAL_COMMUNITY)
Admission: RE | Admit: 2019-08-04 | Discharge: 2019-08-04 | Disposition: A | Discharge status: Home / Self Care | Payer: Medicare Other | Source: Ambulatory Visit | Attending: Cardiovascular Disease | Admitting: Cardiovascular Disease

## 2019-08-04 ENCOUNTER — Other Ambulatory Visit: Payer: Self-pay

## 2019-08-04 DIAGNOSIS — Z951 Presence of aortocoronary bypass graft: Secondary | ICD-10-CM | POA: Diagnosis not present

## 2019-08-04 NOTE — Progress Notes (Signed)
Daily Session Note  Patient Details  Name: Martin [name] MRN: [id number] Date of Birth: 02-26-1956 Referring Provider:     CARDIAC REHAB PHASE II ORIENTATION from 06/23/2019 in [facility]  Referring Provider  Hochrein      Encounter Date: 08/04/2019  Check In: Session Check In - 08/04/19 1058      Check-In   Supervising physician immediately available to respond to emergencies  See telemetry face sheet for immediately available MD    Location  [facility]    Staff Present  Aundra Dubin, RN, BSN;Other    Virtual Visit  No    Medication changes reported      No    Fall or balance concerns reported     Yes    Comments  stroke / using walker    Tobacco Cessation  No Change    Warm-up and Cool-down  Performed as group-led instruction    Resistance Training Performed  Yes    VAD Patient?  No    PAD/SET Patient?  No      Pain Assessment   Currently in Pain?  No/denies    Pain Score  0-No pain    Multiple Pain Sites  No       Capillary Blood Glucose: No results found for this or any previous visit (from the past 24 hour(s)).    Social History   Tobacco Use  Smoking Status Former Smoker  . Packs/day: 1.00  . Years: 35.00  . Pack years: 35.00  . Types: Cigarettes  . Start date: 09/18/1967  . Quit date: 06/09/1999  . Years since quitting: 20.1  Smokeless Tobacco Never Used    Goals Met:  Independence with exercise equipment Exercise tolerated well Personal goals reviewed No report of cardiac concerns or symptoms Strength training completed today  Goals Unmet:  Not Applicable  Comments: check out 12:00   Dr. Kate Sable is Medical Director for [facility] and Pulmonary Rehab.

## 2019-08-07 ENCOUNTER — Encounter (HOSPITAL_COMMUNITY)
Admission: RE | Admit: 2019-08-07 | Discharge: 2019-08-07 | Disposition: A | Discharge status: Home / Self Care | Payer: Medicare Other | Source: Ambulatory Visit | Attending: Cardiovascular Disease | Admitting: Cardiovascular Disease

## 2019-08-07 ENCOUNTER — Other Ambulatory Visit: Payer: Self-pay

## 2019-08-07 DIAGNOSIS — Z951 Presence of aortocoronary bypass graft: Secondary | ICD-10-CM | POA: Diagnosis not present

## 2019-08-07 NOTE — Progress Notes (Signed)
Daily Session Note  Patient Details  Name: Martin [name] MRN: [id number] Date of Birth: Jan 05, 1956 Referring Provider:     CARDIAC REHAB [place] from 06/23/2019 in [facility]  Referring Provider  Hochrein      Encounter Date: 08/07/2019  Check In: Session Check In - 08/07/19 1113      Check-In   Supervising physician immediately available to respond to emergencies  See telemetry face sheet for immediately available MD    Location  [facility]    Staff Present  Russella Dar, MS, EP, [facility], Exercise Physiologist;Debra Wynetta Emery, RN, BSN    Virtual Visit  No    Medication changes reported      No    Fall or balance concerns reported     No    Tobacco Cessation  No Change    Warm-up and Cool-down  Performed as group-led instruction    Resistance Training Performed  Yes    VAD Patient?  No    PAD/SET Patient?  No      Pain Assessment   Currently in Pain?  No/denies    Pain Score  0-No pain    Multiple Pain Sites  No       Capillary Blood Glucose: No results found for this or any previous visit (from the past 24 hour(s)).    Social History   Tobacco Use  Smoking Status Former Smoker  . Packs/day: 1.00  . Years: 35.00  . Pack years: 35.00  . Types: Cigarettes  . Start date: 09/18/1967  . Quit date: 06/09/1999  . Years since quitting: 20.1  Smokeless Tobacco Never Used    Goals Met:  Independence with exercise equipment Exercise tolerated well Personal goals reviewed No report of cardiac concerns or symptoms Strength training completed today  Goals Unmet:  Not Applicable  Comments: Check out: 1200   Dr. Kate Sable is Medical Director for [facility] and Pulmonary Rehab.

## 2019-08-08 DIAGNOSIS — R5381 Other malaise: Secondary | ICD-10-CM | POA: Diagnosis not present

## 2019-08-09 ENCOUNTER — Encounter (HOSPITAL_COMMUNITY)
Admission: RE | Admit: 2019-08-09 | Discharge: 2019-08-09 | Disposition: A | Discharge status: Home / Self Care | Payer: Medicare Other | Source: Ambulatory Visit | Attending: Cardiovascular Disease | Admitting: Cardiovascular Disease

## 2019-08-09 ENCOUNTER — Other Ambulatory Visit: Payer: Self-pay

## 2019-08-09 DIAGNOSIS — Z951 Presence of aortocoronary bypass graft: Secondary | ICD-10-CM | POA: Diagnosis not present

## 2019-08-09 NOTE — Progress Notes (Signed)
Cardiac Individual Treatment Plan  Patient Details  Name: Martin [name] MRN: [id number] Date of Birth: September 22, 1955 Referring Provider:     CARDIAC REHAB PHASE II ORIENTATION from 06/23/2019 in [place]  Referring Provider  [place]      Initial Encounter Date:    CARDIAC REHAB PHASE II ORIENTATION from 06/23/2019 in [place]  Date  06/23/19      Visit Diagnosis: S/P CABG x 3  Patient's Home Medications on Admission:  Current Outpatient Medications:  .  acetaminophen (TYLENOL) 325 MG tablet, Take 1-2 tablets (325-650 mg total) by mouth every 4 (four) hours as needed for mild pain., Disp:  , Rfl:  .  allopurinol (ZYLOPRIM) 300 MG tablet, Take 300 mg by mouth every morning. , Disp: , Rfl:  .  Amino Acids-Protein Hydrolys (FEEDING SUPPLEMENT, PRO-STAT SUGAR FREE 64,) LIQD, Take 30 mLs by mouth 2 (two) times daily., Disp: 887 mL, Rfl: 0 .  amiodarone (PACERONE) 200 MG tablet, Take 1 tablet (200 mg total) by mouth daily., Disp: 90 tablet, Rfl: 3 .  amLODipine (NORVASC) 5 MG tablet, Take 1 tablet (5 mg total) by mouth daily., Disp: 90 tablet, Rfl: 3 .  aspirin EC 81 MG EC tablet, Take 1 tablet (81 mg total) by mouth daily., Disp: 100 tablet, Rfl: 1 .  atorvastatin (LIPITOR) 80 MG tablet, TAKE 1 TABLET BY MOUTH EVERY DAY, Disp: 90 tablet, Rfl: 1 .  Cholecalciferol (VITAMIN D-3) 1000 UNITS CAPS, Take 1,000 Units by mouth every morning. , Disp: , Rfl:  .  citalopram (CELEXA) 10 MG tablet, TAKE 1 TABLET BY MOUTH EVERY DAY, Disp: 90 tablet, Rfl: 1 .  colchicine 0.6 MG tablet, Take 1 tablet (0.6 mg total) by mouth daily at 6 PM., Disp: 30 tablet, Rfl: 1 .  Cyanocobalamin (VITAMIN B-12) 2500 MCG SUBL, Place 2,500 mcg under the tongue every morning. , Disp: , Rfl:  .  diclofenac sodium (VOLTAREN) 1 % GEL, Apply 2 g topically 4 (four) times daily., Disp: 350 g, Rfl: 1 .  docusate sodium (COLACE) 100 MG capsule, Take 2 capsules (200 mg total) by  mouth daily., Disp: 60 capsule, Rfl: 0 .  ELIQUIS 5 MG TABS tablet, TAKE 1 TABLET BY MOUTH TWICE A DAY, Disp: 180 tablet, Rfl: 2 .  fluticasone (FLONASE) 50 MCG/ACT nasal spray, Place 2 sprays into the nose daily.  , Disp: , Rfl:  .  folic acid (FOLVITE) 1 MG tablet, TAKE ONE TABLET BY MOUTH EVERY DAY (Patient taking differently: Take 1 mg by mouth every morning. ), Disp: 30 tablet, Rfl: 6 .  furosemide (LASIX) 20 MG tablet, Take 1 tablet (20 mg total) by mouth 2 (two) times daily., Disp: 180 tablet, Rfl: 3 .  loratadine (CLARITIN) 10 MG tablet, Take 10 mg by mouth every morning. , Disp: , Rfl:  .  LORazepam (ATIVAN) 0.5 MG tablet, Take 0.5 mg by mouth 2 (two) times daily as needed., Disp: , Rfl:  .  meclizine (ANTIVERT) 32 MG tablet, Take 32 mg by mouth daily as needed for dizziness. , Disp: , Rfl:  .  metoprolol tartrate (LOPRESSOR) 25 MG tablet, Take 1 tablet (25 mg total) by mouth every morning., Disp: 90 tablet, Rfl: 3 .  nitroGLYCERIN (NITROSTAT) 0.4 MG SL tablet, PLACE 1 TABLET (0.4 MG TOTAL) UNDER THE TONGUE EVERY 5 (FIVE) MINUTES AS NEEDED. (Patient taking differently: Place 0.4 mg under the tongue every 5 (five) minutes as needed for chest pain. ), Disp: 25  tablet, Rfl: 3 .  pantoprazole (PROTONIX) 40 MG tablet, Take 1 tablet (40 mg total) by mouth daily., Disp: 30 tablet, Rfl: 0 .  polyethylene glycol (MIRALAX / GLYCOLAX) packet, Take 17 g by mouth 3 (three) times a week. , Disp: , Rfl:  .  potassium chloride SA (KLOR-CON M15) 15 MEQ tablet, Take 2 tablets (30 mEq total) by mouth daily., Disp: 180 tablet, Rfl: 3 .  tamsulosin (FLOMAX) 0.4 MG CAPS capsule, Take 1 capsule (0.4 mg total) by mouth 2 (two) times daily., Disp: 60 capsule, Rfl: 0 .  thiamine 100 MG tablet, Take 1 tablet (100 mg total) by mouth daily., Disp: 30 tablet, Rfl: 0 .  traZODone (DESYREL) 100 MG tablet, TAKE 1 TABLET (100 MG TOTAL) BY MOUTH AT BEDTIME AS NEEDED FOR SLEEP., Disp: 90 tablet, Rfl: 1  Past Medical  History: Past Medical History:  Diagnosis Date  . Atrial fibrillation ([place])    a. dx 10/2017. b. s/p DCCV on 11/19/2017 with successful conversion to NSR but back in AFIB 3 weeks later  . CAD (coronary artery disease)    a. s/p PCI to LAD 2001. b. s/p stenting to the RCA and mid LAD December 2011, residual distal RCA disease treated medically. b. nuc 02/2013 abnormal with fixed defect seen in inferoapical, mid-inferior, and basal inferior walls, indicative of myocardial scar, no evidence of ischemia, EF 41% (Ef 55-60% by echo same time).  . Carotid artery disease ([place])    carotid Doppler course left side 5069% stenosis  . Dysrhythmia    AFib  . Habitual alcohol use   . Hyperlipidemia   . Hypertension   . Low back pain   . OSA on CPAP   . Osteoarthritis   . Polysubstance abuse ([place])    use including marijuana and vicodin,which he obtained from the street  . PVC's (premature ventricular contractions)    status post Holter monitor normal sinus rhythm otherwise asymptomatic PVCs.  . Seasonal allergies     Tobacco Use: Social History   Tobacco Use  Smoking Status Former Smoker  . Packs/day: 1.00  . Years: 35.00  . Pack years: 35.00  . Types: Cigarettes  . Start date: 09/18/1967  . Quit date: 06/09/1999  . Years since quitting: 20.1  Smokeless Tobacco Never Used    Labs: Recent Review Flowsheet Data    Labs for ITP Cardiac and Pulmonary Rehab Latest Ref Rng & Units 03/16/2019 03/16/2019 03/16/2019 03/17/2019 03/18/2019   Cholestrol 0 - 200 mg/dL - - - - -   LDLCALC 0 - 99 mg/dL - - - - -   HDL >40 mg/dL - - - - -   Trlycerides <150 mg/dL - - - - -   Hemoglobin A1c 4.8 - 5.6 % - - - - -   PHART 7.350 - 7.450 - - 7.478(H) - -   PCO2ART 32.0 - 48.0 mmHg - - 32.7 - -   HCO3 20.0 - 28.0 mmol/L - - 24.2 - -   TCO2 22 - 32 mmol/L - - 25 - -   ACIDBASEDEF 0.0 - 2.0 mmol/L - - - - -   O2SAT % 44.3 57.7 93.0 63.0 62.5      Capillary Blood Glucose: Lab Results  Component Value Date    GLUCAP 154 (H) 03/27/2019   GLUCAP 121 (H) 03/27/2019   GLUCAP 184 (H) 03/26/2019   GLUCAP 146 (H) 03/26/2019   GLUCAP 146 (H) 03/26/2019     Exercise Target Goals: Exercise Program  Goal: Individual exercise prescription set using results from initial 6 min walk test and THRR while considering  patient's activity barriers and safety.   Exercise Prescription Goal: Starting with aerobic activity 30 plus minutes a day, 3 days per week for initial exercise prescription. Provide home exercise prescription and guidelines that participant acknowledges understanding prior to discharge.  Activity Barriers & Risk Stratification: Activity Barriers & Cardiac Risk Stratification - 06/23/19 1231      Activity Barriers & Cardiac Risk Stratification   Activity Barriers  Deconditioning;Shortness of Breath;Balance Concerns;Assistive Device    Cardiac Risk Stratification  High       6 Minute Walk: 6 Minute Walk    Row Name 06/23/19 1227         6 Minute Walk   Phase  Initial     Distance  200 feet     Walk Time  3 minutes     # of Rest Breaks  0     MPH  0.38     METS  1.29     RPE  15     Perceived Dyspnea   16     VO2 Peak  1.35     Symptoms  -- SOB, legs tired     Resting HR  49 bpm     Resting BP  148/94     Resting Oxygen Saturation   94 %     Exercise Oxygen Saturation  during 6 min walk  95 %     Max Ex. HR  54 bpm     Max Ex. BP  170/102     2 Minute Post BP  148/90        Oxygen Initial Assessment:   Oxygen Re-Evaluation:   Oxygen Discharge (Final Oxygen Re-Evaluation):   Initial Exercise Prescription: Initial Exercise Prescription - 06/23/19 1200      Date of Initial Exercise RX and Referring Provider   Date  06/23/19    Referring Provider  [place]    Expected Discharge Date  11/21/19      Recumbant Elliptical   Level  1    RPM  37    Watts  37    Minutes  15    METs  1.8      T5 Nustep   Level  1    SPM  20    Minutes  17    METs  2      Track    Laps  2    Minutes  5    METs  1.5      Prescription Details   Frequency (times per week)  3    Duration  Progress to 30 minutes of continuous aerobic without signs/symptoms of physical distress      Intensity   THRR 40-80% of Max Heartrate  [phone]    Ratings of Perceived Exertion  11-13    Perceived Dyspnea  0-4      Progression   Progression  Continue to progress workloads to maintain intensity without signs/symptoms of physical distress.      Resistance Training   Training Prescription  Yes    Weight  1    Reps  10-15   Patient will do less if he gets too SOB or shoulder pain      Perform Capillary Blood Glucose checks as needed.  Exercise Prescription Changes:  Exercise Prescription Changes    Row Name 07/31/19 1100 08/09/19 1100  Response to Exercise   Blood Pressure (Admit)  142/64  150/60      Blood Pressure (Exercise)  140/68  132/80      Blood Pressure (Exit)  122/62  118/62      Heart Rate (Admit)  67 bpm  58 bpm      Heart Rate (Exercise)  82 bpm  94 bpm      Heart Rate (Exit)  67 bpm  54 bpm      Rating of Perceived Exertion (Exercise)  --  12      Symptoms  --  Balance issues        Progression   Progression  Continue to progress workloads to maintain intensity without signs/symptoms of physical distress.  Continue to progress workloads to maintain intensity without signs/symptoms of physical distress.      Average METs  1.9  2        Resistance Training   Training Prescription  Yes  Yes      Weight  1  2      Reps  10-15  10-15      Time  10 Minutes  10 Minutes        Arm Ergometer   Level  1.5  1.6      Watts  8  15      Minutes  22  22      METs  1.8  2.5        T5 Nustep   Level  1  1      SPM  57  89      Minutes  17  17      METs  1.9  2        Home Exercise Plan   Plans to continue exercise at  Home (comment)  Home (comment)      Frequency  Add 2 additional days to program exercise sessions.  Add 2 additional days  to program exercise sessions.      Initial Home Exercises Provided  07/10/19  07/10/19         Exercise Comments:  Exercise Comments    Row Name 07/17/19 [id number] 07/31/19 1131 08/09/19 1148       Exercise Comments  Patient is hoping to gain strength and to get back to work. He is doing well in the program. Progressing very slowly because he is still weak.  Patient is getting better at a slow pace. He is consistent and determined to regain his strength. We will progress him slowly but at a steady pace.  Patient is doing better. Will continue to progress as tolerated.        Exercise Goals and Review:  Exercise Goals    Row Name 06/23/19 1242             Exercise Goals   Increase Physical Activity  Yes       Expected Outcomes  Long Term: Add in home exercise to make exercise part of routine and to increase amount of physical activity.;Short Term: Attend rehab on a regular basis to increase amount of physical activity.;Long Term: Exercising regularly at least 3-5 days a week.       Increase Strength and Stamina  Yes       Intervention  Provide advice, education, support and counseling about physical activity/exercise needs.;Develop an individualized exercise prescription for aerobic and resistive training based on initial evaluation findings, risk stratification, comorbidities and participant's personal goals.  Expected Outcomes  Short Term: Increase workloads from initial exercise prescription for resistance, speed, and METs.;Short Term: Perform resistance training exercises routinely during rehab and add in resistance training at home;Long Term: Improve cardiorespiratory fitness, muscular endurance and strength as measured by increased METs and functional capacity (6MWT)       Able to understand and use rate of perceived exertion (RPE) scale  Yes       Intervention  Provide education and explanation on how to use RPE scale       Expected Outcomes  Short Term: Able to use RPE daily in  rehab to express subjective intensity level;Long Term:  Able to use RPE to guide intensity level when exercising independently       Knowledge and understanding of Target Heart Rate Range (THRR)  Yes       Intervention  Provide education and explanation of THRR including how the numbers were predicted and where they are located for reference       Expected Outcomes  Short Term: Able to use daily as guideline for intensity in rehab;Long Term: Able to use THRR to govern intensity when exercising independently       Able to check pulse independently  Yes       Intervention  Provide education and demonstration on how to check pulse in carotid and radial arteries.;Review the importance of being able to check your own pulse for safety during independent exercise       Expected Outcomes  Short Term: Able to explain why pulse checking is important during independent exercise;Long Term: Able to check pulse independently and accurately       Understanding of Exercise Prescription  Yes       Intervention  Provide education, explanation, and written materials on patient's individual exercise prescription       Expected Outcomes  Short Term: Able to explain program exercise prescription;Long Term: Able to explain home exercise prescription to exercise independently          Exercise Goals Re-Evaluation : Exercise Goals Re-Evaluation    Row Name 07/17/19 [id number] 07/31/19 1129 08/09/19 1145         Exercise Goal Re-Evaluation   Exercise Goals Review  Increase Physical Activity;Increase Strength and Stamina;Understanding of Exercise Prescription;Knowledge and understanding of Target Heart Rate Range (THRR)  Increase Physical Activity;Increase Strength and Stamina;Able to check pulse independently  Increase Physical Activity;Increase Strength and Stamina     Comments  This is patients first week. Will progress in the next 2 week cycle. He is still very weak. He tries very hard to do the exercises. He is still  coming in with his walker.  Patient is gaining strength. He is able to tolerate the exercises and able to progress. He is still hoping to get back to work. His progress is slow but steady.  Patient is doing better. Able to progress as tolerated. Still not able to do the TM after giving it a try today 08/09/19.     Expected Outcomes  To gain strength so that he does not need the walker and to possibily get back to work.  To get back to work and to become more independent.  To reach his goals of getting stronger and being more independent.         Discharge Exercise Prescription (Final Exercise Prescription Changes): Exercise Prescription Changes - 08/09/19 1100      Response to Exercise   Blood Pressure (Admit)  150/60    Blood Pressure (  Exercise)  132/80    Blood Pressure (Exit)  118/62    Heart Rate (Admit)  58 bpm    Heart Rate (Exercise)  94 bpm    Heart Rate (Exit)  54 bpm    Rating of Perceived Exertion (Exercise)  12    Symptoms  Balance issues      Progression   Progression  Continue to progress workloads to maintain intensity without signs/symptoms of physical distress.    Average METs  2      Resistance Training   Training Prescription  Yes    Weight  2    Reps  10-15    Time  10 Minutes      Arm Ergometer   Level  1.6    Watts  15    Minutes  22    METs  2.5      T5 Nustep   Level  1    SPM  89    Minutes  17    METs  2      Home Exercise Plan   Plans to continue exercise at  Home (comment)    Frequency  Add 2 additional days to program exercise sessions.    Initial Home Exercises Provided  07/10/19       Nutrition:  Target Goals: Understanding of nutrition guidelines, daily intake of sodium 1500mg , cholesterol 200mg , calories 30% from fat and 7% or less from saturated fats, daily to have 5 or more servings of fruits and vegetables.  Biometrics: Pre Biometrics - 06/23/19 1244      Pre Biometrics   Height  5\' 8"  (1.727 m)    Weight  114.7 kg    Waist  Circumference  51 inches    Hip Circumference  48 inches    Waist to Hip Ratio  1.06 %    BMI (Calculated)  38.45    Triceps Skinfold  15 mm    % Body Fat  36.7 %    Grip Strength  17.2 kg    Flexibility  0 in    Single Leg Stand  2.25 seconds        Nutrition Therapy Plan and Nutrition Goals: Nutrition Therapy & Goals - 08/09/19 1456      Personal Nutrition Goals   Additional Goals?  No    Comments  We continue to work to schedule RD classes. Patient continues to eat less greasy foods and try to follow a low sodium diet. His medficts score was 6 which indicates he is already following a theraputic diet. Will continue to monitor for progress.      Intervention Plan   Intervention  Nutrition handout(s) given to patient.       Nutrition Assessments: Nutrition Assessments - 06/23/19 1250      MEDFICTS Scores   Pre Score  6       Nutrition Goals Re-Evaluation:   Nutrition Goals Discharge (Final Nutrition Goals Re-Evaluation):   Psychosocial: Target Goals: Acknowledge presence or absence of significant depression and/or stress, maximize coping skills, provide positive support system. Participant is able to verbalize types and ability to use techniques and skills needed for reducing stress and depression.  Initial Review & Psychosocial Screening: Initial Psych Review & Screening - 06/23/19 1247      Initial Review   Current issues with  None Identified      Family Dynamics   Good Support System?  Yes      Barriers   Psychosocial barriers to participate  in program  There are no identifiable barriers or psychosocial needs.      Screening Interventions   Interventions  Encouraged to exercise       Quality of Life Scores: Quality of Life - 06/23/19 1248      Quality of Life   Select  Quality of Life      Quality of Life Scores   Health/Function Pre  22.77 %    Socioeconomic Pre  28.29 %    Psych/Spiritual Pre  27.43 %    Family Pre  27.6 %    GLOBAL Pre   25.57 %      Scores of 19 and below usually indicate a poorer quality of life in these areas.  A difference of  2-3 points is a clinically meaningful difference.  A difference of 2-3 points in the total score of the Quality of Life Index has been associated with significant improvement in overall quality of life, self-image, physical symptoms, and general health in studies assessing change in quality of life.  PHQ-9: Recent Review Flowsheet Data    Depression screen [facility] 2/9 05/31/2019 04/24/2019   Decreased Interest 0 0   Down, Depressed, Hopeless 0 0   PHQ - 2 Score 0 0   Altered sleeping - 1   Tired, decreased energy - 1   Change in appetite - 0   Feeling bad or failure about yourself  - 0   Trouble concentrating - 0   Moving slowly or fidgety/restless - 0   Suicidal thoughts - 0   PHQ-9 Score - 2   Difficult doing work/chores - Not difficult at all     Interpretation of Total Score  Total Score Depression Severity:  1-4 = Minimal depression, 5-9 = Mild depression, 10-14 = Moderate depression, 15-19 = Moderately severe depression, 20-27 = Severe depression   Psychosocial Evaluation and Intervention: Psychosocial Evaluation - 06/23/19 1247      Psychosocial Evaluation & Interventions   Interventions  Encouraged to exercise with the program and follow exercise prescription    Continue Psychosocial Services   No Follow up required       Psychosocial Re-Evaluation: Psychosocial Re-Evaluation    Row Name 07/12/19 1504 08/09/19 1534           Psychosocial Re-Evaluation   Current issues with  Current Anxiety/Panic  Current Anxiety/Panic      Comments  Patient has GAD managed with Citalopram 10 mg daily and Lorazepam 0.5 mg BID prn. He says his anxiety is controlled. His initial QOL score was 25.72 and his PHQ-9 score was 2. Will continue to monitor.  Patient has GAD managed with Citalopram 10 mg daily and Lorazepam 0.5 mg BID prn. He says his anxiety is controlled. His initial  QOL score was 25.72 and his PHQ-9 score was 2. Will continue to monitor.      Expected Outcomes  Patient's GAD will continue to be managed with no additional psychosocial issues identified at discharge.  Patient's GAD will continue to be managed with no additional psychosocial issues identified at discharge.      Interventions  Stress management education;Encouraged to attend Cardiac Rehabilitation for the exercise;Relaxation education  Stress management education;Encouraged to attend Cardiac Rehabilitation for the exercise;Relaxation education      Continue Psychosocial Services   Follow up required by staff  Follow up required by staff         Psychosocial Discharge (Final Psychosocial Re-Evaluation): Psychosocial Re-Evaluation - 08/09/19 1534  Psychosocial Re-Evaluation   Current issues with  Current Anxiety/Panic    Comments  Patient has GAD managed with Citalopram 10 mg daily and Lorazepam 0.5 mg BID prn. He says his anxiety is controlled. His initial QOL score was 25.72 and his PHQ-9 score was 2. Will continue to monitor.    Expected Outcomes  Patient's GAD will continue to be managed with no additional psychosocial issues identified at discharge.    Interventions  Stress management education;Encouraged to attend Cardiac Rehabilitation for the exercise;Relaxation education    Continue Psychosocial Services   Follow up required by staff       Vocational Rehabilitation: Provide vocational rehab assistance to qualifying candidates.   Vocational Rehab Evaluation & Intervention: Vocational Rehab - 06/23/19 1252      Initial Vocational Rehab Evaluation & Intervention   Assessment shows need for Vocational Rehabilitation  No       Education: Education Goals: Education classes will be provided on a weekly basis, covering required topics. Participant will state understanding/return demonstration of topics presented.  Learning Barriers/Preferences: Learning Barriers/Preferences -  06/23/19 1251      Learning Barriers/Preferences   Learning Barriers  None    Learning Preferences  Individual Instruction;Group Instruction;Pictoral;Video;Computer/Internet       Education Topics: Hypertension, Hypertension Reduction -Define heart disease and high blood pressure. Discus how high blood pressure affects the body and ways to reduce high blood pressure.   Exercise and Your Heart -Discuss why it is important to exercise, the FITT principles of exercise, normal and abnormal responses to exercise, and how to exercise safely.   Angina -Discuss definition of angina, causes of angina, treatment of angina, and how to decrease risk of having angina.   Cardiac Medications -Review what the following cardiac medications are used for, how they affect the body, and side effects that may occur when taking the medications.  Medications include Aspirin, Beta blockers, calcium channel blockers, ACE Inhibitors, angiotensin receptor blockers, diuretics, digoxin, and antihyperlipidemics.   Congestive Heart Failure -Discuss the definition of CHF, how to live with CHF, the signs and symptoms of CHF, and how keep track of weight and sodium intake.   CARDIAC REHAB PHASE II EXERCISE from 08/09/2019 in [place]  Date  07/12/19  Educator  Etheleen Mayhew  Instruction Review Code  2- Demonstrated Understanding      Heart Disease and Intimacy -Discus the effect sexual activity has on the heart, how changes occur during intimacy as we age, and safety during sexual activity.   Smoking Cessation / COPD -Discuss different methods to quit smoking, the health benefits of quitting smoking, and the definition of COPD.   Nutrition I: Fats -Discuss the types of cholesterol, what cholesterol does to the heart, and how cholesterol levels can be controlled.   CARDIAC REHAB PHASE II EXERCISE from 08/09/2019 in [place]  Date  08/02/19  Educator  Etheleen Mayhew   Instruction Review Code  2- Demonstrated Understanding      Nutrition II: Labels -Discuss the different components of food labels and how to read food label   CARDIAC REHAB PHASE II EXERCISE from 08/09/2019 in [place]  Date  08/09/19  Educator  DC  Instruction Review Code  2- Demonstrated Understanding      Heart Parts/Heart Disease and PAD -Discuss the anatomy of the heart, the pathway of blood circulation through the heart, and these are affected by heart disease.   Stress I: Signs and Symptoms -Discuss the  causes of stress, how stress may lead to anxiety and depression, and ways to limit stress.   Stress II: Relaxation -Discuss different types of relaxation techniques to limit stress.   Warning Signs of Stroke / TIA -Discuss definition of a stroke, what the signs and symptoms are of a stroke, and how to identify when someone is having stroke.   Knowledge Questionnaire Score: Knowledge Questionnaire Score - 06/23/19 1251      Knowledge Questionnaire Score   Pre Score  21/24       Core Components/Risk Factors/Patient Goals at Admission: Personal Goals and Risk Factors at Admission - 06/23/19 1252      Core Components/Risk Factors/Patient Goals on Admission    Weight Management  Weight Maintenance    Personal Goal Other  Yes    Personal Goal  Get stringer and become more independent    Intervention  Since [facility] is closed at this time. We have given Mr. Pelayo a home exercise plan to log his activities in the [employer] app 3 x week. Once we reopen we will call him to bring in the [facility] gym to do 3 x week in gym and 2 x week at home.    Expected Outcomes  To get stronger and to become more independent       Core Components/Risk Factors/Patient Goals Review:  Goals and Risk Factor Review    Row Name 08/09/19 1458             Core Components/Risk Factors/Patient Goals Review   Personal Goals Review  Weight Management/Obesity;Improve  shortness of breath with ADL's Get stronger; be more independent. Improve balance.       Review  Patient has completed 12 sessions gaining 2 lbs since last 30 day review. He is doing well in the program with progression. He ambulates with a walker. His balance is poor and he is very unsteady ambulating and standing and pivoting without his walker so much so that we are having him do his warm-up and weight exercises seated. He reports feeling stronger since he has been attending and noting less SOB. He say his PCP from wheezing and SOB a few weeks ago and he adjusted his Furosemide dosage. Patient is pleased with his progress in the program so far. He really wants to improve his balance and stop using the walker. Will continue to monitor for progress.       Expected Outcomes  Patient will continue to attend sessions and complete the program meeting his personal goals.          Core Components/Risk Factors/Patient Goals at Discharge (Final Review):  Goals and Risk Factor Review - 08/09/19 1458      Core Components/Risk Factors/Patient Goals Review   Personal Goals Review  Weight Management/Obesity;Improve shortness of breath with ADL's   Get stronger; be more independent. Improve balance.   Review  Patient has completed 12 sessions gaining 2 lbs since last 30 day review. He is doing well in the program with progression. He ambulates with a walker. His balance is poor and he is very unsteady ambulating and standing and pivoting without his walker so much so that we are having him do his warm-up and weight exercises seated. He reports feeling stronger since he has been attending and noting less SOB. He say his PCP from wheezing and SOB a few weeks ago and he adjusted his Furosemide dosage. Patient is pleased with his progress in the program so far. He really wants to improve  his balance and stop using the walker. Will continue to monitor for progress.    Expected Outcomes  Patient will continue to attend  sessions and complete the program meeting his personal goals.       ITP Comments: ITP Comments    Row Name 06/23/19 1159 07/12/19 1503         ITP Comments  Patient came in today for his orientation/assessment to get started with the virtual program until we reopn. He has had stents in the past in 2001 and 2011. He also had a stroke right after his CABG surgery.  Patient is new to the program completing 2 sessions. He plans to compete the program. Will continue to monitor for progress.         Comments: ITP REVIEW Pt is making expected progress toward Cardiac Rehab goals after completing 12  sessions. Recommend continued exercise, life style modification, education, and increased stamina and strength.

## 2019-08-09 NOTE — Progress Notes (Signed)
Daily Session Note  Patient Details  Name: Martin [name] MRN: [id number] Date of Birth: [birth year] Referring Provider:     CARDIAC REHAB [place] from 06/23/2019 in [place]  Referring Provider  Hochrein      Encounter Date: 08/09/2019  Check In: Session Check In - 08/09/19 1117      Check-In   Supervising physician immediately available to respond to emergencies  See telemetry face sheet for immediately available MD    Location  [facility]    Staff Present  Russella Dar, MS, EP, [facility], Exercise Physiologist;Debra Wynetta Emery, RN, BSN    Virtual Visit  No    Medication changes reported      No    Fall or balance concerns reported     Yes    Comments  Had a stroke.    Tobacco Cessation  No Change    Warm-up and Cool-down  Performed as group-led instruction    Resistance Training Performed  Yes    VAD Patient?  No    PAD/SET Patient?  No      Pain Assessment   Currently in Pain?  No/denies    Pain Score  0-No pain    Multiple Pain Sites  No       Capillary Blood Glucose: No results found for this or any previous visit (from the past 24 hour(s)).    Social History   Tobacco Use  Smoking Status Former Smoker  . Packs/day: 1.00  . Years: 35.00  . Pack years: 35.00  . Types: Cigarettes  . Start date: 09/18/1967  . Quit date: 06/09/1999  . Years since quitting: 20.1  Smokeless Tobacco Never Used    Goals Met:  Independence with exercise equipment Improved SOB with ADL's Exercise tolerated well Personal goals reviewed No report of cardiac concerns or symptoms Strength training completed today  Goals Unmet:  Not Applicable  Comments: Check out: 1200   Dr. Kate Sable is Medical Director for [place] and Pulmonary Rehab.

## 2019-08-10 DIAGNOSIS — I69354 Hemiplegia and hemiparesis following cerebral infarction affecting left non-dominant side: Secondary | ICD-10-CM | POA: Diagnosis not present

## 2019-08-10 DIAGNOSIS — Z951 Presence of aortocoronary bypass graft: Secondary | ICD-10-CM | POA: Diagnosis not present

## 2019-08-10 DIAGNOSIS — Z7409 Other reduced mobility: Secondary | ICD-10-CM | POA: Diagnosis not present

## 2019-08-10 DIAGNOSIS — I63331 Cerebral infarction due to thrombosis of right posterior cerebral artery: Secondary | ICD-10-CM | POA: Diagnosis not present

## 2019-08-11 ENCOUNTER — Other Ambulatory Visit: Payer: Self-pay

## 2019-08-11 ENCOUNTER — Encounter (HOSPITAL_COMMUNITY)
Admission: RE | Admit: 2019-08-11 | Discharge: 2019-08-11 | Disposition: A | Discharge status: Home / Self Care | Payer: Medicare Other | Source: Ambulatory Visit | Attending: Cardiovascular Disease | Admitting: Cardiovascular Disease

## 2019-08-11 DIAGNOSIS — Z951 Presence of aortocoronary bypass graft: Secondary | ICD-10-CM | POA: Diagnosis not present

## 2019-08-14 ENCOUNTER — Encounter (HOSPITAL_COMMUNITY)
Admission: RE | Admit: 2019-08-14 | Discharge: 2019-08-14 | Disposition: A | Discharge status: Home / Self Care | Payer: Medicare Other | Source: Ambulatory Visit | Attending: Cardiovascular Disease | Admitting: Cardiovascular Disease

## 2019-08-14 ENCOUNTER — Other Ambulatory Visit: Payer: Self-pay

## 2019-08-14 DIAGNOSIS — Z951 Presence of aortocoronary bypass graft: Secondary | ICD-10-CM | POA: Diagnosis not present

## 2019-08-14 NOTE — Progress Notes (Signed)
Daily Session Note  Patient Details  Name: Martin [name] MRN: [id number] Date of Birth: [birth year] Referring Provider:     CARDIAC REHAB [place] from 06/23/2019 in [place]  Referring Provider  Hochrein      Encounter Date: 08/14/2019  Check In: Session Check In - 08/14/19 1100      Check-In   Supervising physician immediately available to respond to emergencies  See telemetry face sheet for immediately available MD    Location  [facility]    Staff Present  Russella Dar, MS, EP, [facility], Exercise Physiologist;Debra Wynetta Emery, RN, BSN;Other    Virtual Visit  No    Medication changes reported      No    Comments  stroke/ walker    Tobacco Cessation  No Change    Warm-up and Cool-down  Performed as group-led instruction    Resistance Training Performed  Yes    VAD Patient?  No    PAD/SET Patient?  No      Pain Assessment   Currently in Pain?  No/denies    Pain Score  0-No pain    Multiple Pain Sites  No       Capillary Blood Glucose: No results found for this or any previous visit (from the past 24 hour(s)).    Social History   Tobacco Use  Smoking Status Former Smoker  . Packs/day: 1.00  . Years: 35.00  . Pack years: 35.00  . Types: Cigarettes  . Start date: 09/18/1967  . Quit date: 06/09/1999  . Years since quitting: 20.1  Smokeless Tobacco Never Used    Goals Met:  Independence with exercise equipment Exercise tolerated well Personal goals reviewed No report of cardiac concerns or symptoms Strength training completed today  Goals Unmet:  Not Applicable  Comments: check out 12:00   Dr. Kate Sable is Medical Director for [place] and Pulmonary Rehab.

## 2019-08-15 DIAGNOSIS — H47011 Ischemic optic neuropathy, right eye: Secondary | ICD-10-CM | POA: Diagnosis not present

## 2019-08-15 DIAGNOSIS — G4733 Obstructive sleep apnea (adult) (pediatric): Secondary | ICD-10-CM | POA: Diagnosis not present

## 2019-08-16 ENCOUNTER — Encounter (HOSPITAL_COMMUNITY)
Admission: RE | Admit: 2019-08-16 | Discharge: 2019-08-16 | Disposition: A | Discharge status: Home / Self Care | Payer: Medicare Other | Source: Ambulatory Visit | Attending: Cardiovascular Disease | Admitting: Cardiovascular Disease

## 2019-08-16 ENCOUNTER — Other Ambulatory Visit: Payer: Self-pay

## 2019-08-16 DIAGNOSIS — Z951 Presence of aortocoronary bypass graft: Secondary | ICD-10-CM

## 2019-08-16 NOTE — Progress Notes (Signed)
Daily Session Note  Patient Details  Name: Martin [name] MRN: [id number] Date of Birth: 11/05/1955 Referring Provider:     CARDIAC REHAB PHASE II ORIENTATION from 06/23/2019 in [place] [facility]  Referring Provider  Hochrein      Encounter Date: 08/16/2019  Check In: Session Check In - 08/16/19 1135      Check-In   Supervising physician immediately available to respond to emergencies  See telemetry face sheet for immediately available MD    Location  [facility]    Staff Present  Diane Coad, MS, EP, [facility], Exercise Physiologist;Debra Johnson, RN, BSN    Virtual Visit  No    Medication changes reported      No    Fall or balance concerns reported     Yes    Comments  Had a stroke    Tobacco Cessation  No Change    Warm-up and Cool-down  Performed as group-led instruction    Resistance Training Performed  Yes    VAD Patient?  No    PAD/SET Patient?  No      Pain Assessment   Currently in Pain?  No/denies    Pain Score  0-No pain    Multiple Pain Sites  No       Capillary Blood Glucose: No results found for this or any previous visit (from the past 24 hour(s)).    Social History   Tobacco Use  Smoking Status Former Smoker  . Packs/day: 1.00  . Years: 35.00  . Pack years: 35.00  . Types: Cigarettes  . Start date: 09/18/1967  . Quit date: 06/09/1999  . Years since quitting: 20.2  Smokeless Tobacco Never Used    Goals Met:  Independence with exercise equipment Exercise tolerated well Personal goals reviewed No report of cardiac concerns or symptoms Strength training completed today  Goals Unmet:  Not Applicable  Comments: Check out: 1200   Dr. Suresh Koneswaran is Medical Director for [place] [facility] and Pulmonary Rehab. 

## 2019-08-18 ENCOUNTER — Other Ambulatory Visit: Payer: Self-pay

## 2019-08-18 ENCOUNTER — Encounter (HOSPITAL_COMMUNITY)
Admission: RE | Admit: 2019-08-18 | Discharge: 2019-08-18 | Disposition: A | Discharge status: Home / Self Care | Payer: Medicare Other | Source: Ambulatory Visit | Attending: Cardiovascular Disease | Admitting: Cardiovascular Disease

## 2019-08-18 DIAGNOSIS — Z951 Presence of aortocoronary bypass graft: Secondary | ICD-10-CM

## 2019-08-18 NOTE — Progress Notes (Signed)
Daily Session Note  Patient Details  Name: Martin [name] MRN: [id number] Date of Birth: [birth year] Referring Provider:     CARDIAC REHAB [place] from 06/23/2019 in [place]  Referring Provider  Hochrein      Encounter Date: 08/18/2019  Check In: Session Check In - 08/18/19 1100      Check-In   Supervising physician immediately available to respond to emergencies  See telemetry face sheet for immediately available MD    Location  [facility]    Staff Present  Russella Dar, MS, EP, [facility], Exercise Physiologist;Saintclair Schroader Wynetta Emery, RN, BSN    Virtual Visit  No    Medication changes reported      No    Fall or balance concerns reported     Yes    Comments  Unsteady gait using a walker. Has poor balance secondary to CVA.    Tobacco Cessation  No Change    Warm-up and Cool-down  Performed as group-led instruction    Resistance Training Performed  Yes    VAD Patient?  No    PAD/SET Patient?  No      Pain Assessment   Currently in Pain?  No/denies    Pain Score  0-No pain    Multiple Pain Sites  No       Capillary Blood Glucose: No results found for this or any previous visit (from the past 24 hour(s)).    Social History   Tobacco Use  Smoking Status Former Smoker  . Packs/day: 1.00  . Years: 35.00  . Pack years: 35.00  . Types: Cigarettes  . Start date: 09/18/1967  . Quit date: 06/09/1999  . Years since quitting: 20.2  Smokeless Tobacco Never Used    Goals Met:  Independence with exercise equipment Exercise tolerated well No report of cardiac concerns or symptoms Strength training completed today  Goals Unmet:  Not Applicable  Comments: Check out 1200.   Dr. Kate Sable is Medical Director for [facility] Cardiac and Pulmonary Rehab.

## 2019-08-21 ENCOUNTER — Encounter (HOSPITAL_COMMUNITY)
Admission: RE | Admit: 2019-08-21 | Discharge: 2019-08-21 | Disposition: A | Discharge status: Home / Self Care | Payer: Medicare Other | Source: Ambulatory Visit | Attending: Cardiovascular Disease | Admitting: Cardiovascular Disease

## 2019-08-21 ENCOUNTER — Other Ambulatory Visit: Payer: Self-pay

## 2019-08-21 DIAGNOSIS — Z951 Presence of aortocoronary bypass graft: Secondary | ICD-10-CM

## 2019-08-21 NOTE — Progress Notes (Signed)
Daily Session Note  Patient Details  Name: Martin [name] MRN: [id number] Date of Birth: 02/28/1956 Referring Provider:     CARDIAC REHAB PHASE II ORIENTATION from 06/23/2019 in [place] [facility]  Referring Provider  Hochrein      Encounter Date: 08/21/2019  Check In: Session Check In - 08/21/19 1100      Check-In   Supervising physician immediately available to respond to emergencies  See telemetry face sheet for immediately available MD    Location  [facility]    Staff Present  Diane Coad, MS, EP, [facility], Exercise Physiologist;Debra Johnson, RN, BSN    Virtual Visit  No    Medication changes reported      No    Fall or balance concerns reported     Yes    Comments  Unsteady gait using a walker. Has poor balance secondary to CVA.    Tobacco Cessation  No Change    Warm-up and Cool-down  Performed as group-led instruction    Resistance Training Performed  Yes    VAD Patient?  No    PAD/SET Patient?  No      Pain Assessment   Currently in Pain?  No/denies    Pain Score  0-No pain    Multiple Pain Sites  No       Capillary Blood Glucose: No results found for this or any previous visit (from the past 24 hour(s)).    Social History   Tobacco Use  Smoking Status Former Smoker  . Packs/day: 1.00  . Years: 35.00  . Pack years: 35.00  . Types: Cigarettes  . Start date: 09/18/1967  . Quit date: 06/09/1999  . Years since quitting: 20.2  Smokeless Tobacco Never Used    Goals Met:  Independence with exercise equipment Exercise tolerated well No report of cardiac concerns or symptoms Strength training completed today  Goals Unmet:  Not Applicable  Comments: Check out 1200.   Dr. Suresh Koneswaran is Medical Director for [place] Cardiac and Pulmonary Rehab. 

## 2019-08-23 ENCOUNTER — Encounter (HOSPITAL_COMMUNITY)
Admission: RE | Admit: 2019-08-23 | Discharge: 2019-08-23 | Disposition: A | Discharge status: Home / Self Care | Payer: Medicare Other | Source: Ambulatory Visit | Attending: Cardiovascular Disease | Admitting: Cardiovascular Disease

## 2019-08-23 ENCOUNTER — Other Ambulatory Visit: Payer: Self-pay

## 2019-08-23 DIAGNOSIS — Z951 Presence of aortocoronary bypass graft: Secondary | ICD-10-CM | POA: Diagnosis not present

## 2019-08-23 NOTE — Progress Notes (Signed)
Daily Session Note  Patient Details  Name: Martin [name] MRN: [id number] Date of Birth: 13-Sep-1955 Referring Provider:     CARDIAC REHAB PHASE II ORIENTATION from 06/23/2019 in [place]  Referring Provider  Hochrein      Encounter Date: 08/23/2019  Check In: Session Check In - 08/23/19 1100      Check-In   Supervising physician immediately available to respond to emergencies  See telemetry face sheet for immediately available MD    Location  [facility]    Staff Present  Russella Dar, MS, EP, [facility], Exercise Physiologist;Mishka Stegemann Wynetta Emery, RN, BSN    Virtual Visit  No    Medication changes reported      No    Fall or balance concerns reported     Yes    Comments  Unsteady gait using a walker. Has poor balance secondary to CVA.    Tobacco Cessation  No Change    Warm-up and Cool-down  Performed as group-led instruction    Resistance Training Performed  Yes    VAD Patient?  No    PAD/SET Patient?  No      Pain Assessment   Currently in Pain?  No/denies    Pain Score  0-No pain    Multiple Pain Sites  No       Capillary Blood Glucose: No results found for this or any previous visit (from the past 24 hour(s)).    Social History   Tobacco Use  Smoking Status Former Smoker  . Packs/day: 1.00  . Years: 35.00  . Pack years: 35.00  . Types: Cigarettes  . Start date: 09/18/1967  . Quit date: 06/09/1999  . Years since quitting: 20.2  Smokeless Tobacco Never Used    Goals Met:  Independence with exercise equipment Exercise tolerated well No report of cardiac concerns or symptoms Strength training completed today  Goals Unmet:  Not Applicable  Comments: Check out 1200.   Dr. Kate Sable is Medical Director for [facility] Cardiac and Pulmonary Rehab.

## 2019-08-25 ENCOUNTER — Encounter (HOSPITAL_COMMUNITY)
Admission: RE | Admit: 2019-08-25 | Discharge: 2019-08-25 | Disposition: A | Discharge status: Home / Self Care | Payer: Medicare Other | Source: Ambulatory Visit | Attending: Cardiovascular Disease | Admitting: Cardiovascular Disease

## 2019-08-25 ENCOUNTER — Other Ambulatory Visit: Payer: Self-pay

## 2019-08-25 ENCOUNTER — Other Ambulatory Visit: Payer: Self-pay | Admitting: Physical Medicine and Rehabilitation

## 2019-08-25 DIAGNOSIS — Z951 Presence of aortocoronary bypass graft: Secondary | ICD-10-CM | POA: Diagnosis not present

## 2019-08-25 DIAGNOSIS — M10071 Idiopathic gout, right ankle and foot: Secondary | ICD-10-CM

## 2019-08-25 NOTE — Progress Notes (Signed)
Daily Session Note  Patient Details  Name: CLARY MEEKER MRN: [id number] Date of Birth: 12/28/1955 Referring Provider:     CARDIAC REHAB PHASE II ORIENTATION from 06/23/2019 in [place]  Referring Provider  Hochrein      Encounter Date: 08/25/2019  Check In: Session Check In - 08/25/19 1100      Check-In   Supervising physician immediately available to respond to emergencies  See telemetry face sheet for immediately available MD    Location  [facility]    Staff Present  Russella Dar, MS, EP, [facility], Exercise Physiologist;Obdulia Steier Wynetta Emery, RN, BSN    Virtual Visit  No    Medication changes reported      No    Fall or balance concerns reported     Yes    Comments  Unsteady gait using a walker. Has poor balance secondary to CVA.    Tobacco Cessation  No Change    Warm-up and Cool-down  Performed as group-led instruction    Resistance Training Performed  Yes    VAD Patient?  No    PAD/SET Patient?  No      Pain Assessment   Currently in Pain?  No/denies    Pain Score  0-No pain    Multiple Pain Sites  No       Capillary Blood Glucose: No results found for this or any previous visit (from the past 24 hour(s)).    Social History   Tobacco Use  Smoking Status Former Smoker  . Packs/day: 1.00  . Years: 35.00  . Pack years: 35.00  . Types: Cigarettes  . Start date: 09/18/1967  . Quit date: 06/09/1999  . Years since quitting: 20.2  Smokeless Tobacco Never Used    Goals Met:  Independence with exercise equipment Exercise tolerated well No report of cardiac concerns or symptoms Strength training completed today  Goals Unmet:  Not Applicable  Comments: Check out 1200.   Dr. Kate Sable is Medical Director for [facility] Cardiac and Pulmonary Rehab.

## 2019-08-28 ENCOUNTER — Other Ambulatory Visit: Payer: Self-pay

## 2019-08-28 ENCOUNTER — Encounter (HOSPITAL_COMMUNITY)
Admission: RE | Admit: 2019-08-28 | Discharge: 2019-08-28 | Disposition: A | Discharge status: Home / Self Care | Payer: Medicare Other | Source: Ambulatory Visit | Attending: Cardiovascular Disease | Admitting: Cardiovascular Disease

## 2019-08-28 DIAGNOSIS — H903 Sensorineural hearing loss, bilateral: Secondary | ICD-10-CM | POA: Diagnosis not present

## 2019-08-28 DIAGNOSIS — Z951 Presence of aortocoronary bypass graft: Secondary | ICD-10-CM

## 2019-08-28 DIAGNOSIS — R42 Dizziness and giddiness: Secondary | ICD-10-CM | POA: Diagnosis not present

## 2019-08-28 NOTE — Progress Notes (Signed)
Daily Session Note  Patient Details  Name: Martin [name] MRN: [id number] Date of Birth: 12-26-1955 Referring Provider:     CARDIAC REHAB [place] from 06/23/2019 in [place]  Referring Provider  Hochrein      Encounter Date: 08/28/2019  Check In: Session Check In - 08/28/19 1102      Check-In   Supervising physician immediately available to respond to emergencies  See telemetry face sheet for immediately available MD    Location  [facility]    Staff Present  Russella Dar, MS, EP, [facility], Exercise Physiologist;Debra Wynetta Emery, RN, BSN    Virtual Visit  No    Medication changes reported      No    Fall or balance concerns reported     No    Tobacco Cessation  No Change    Warm-up and Cool-down  Performed as group-led instruction    Resistance Training Performed  Yes    VAD Patient?  No    PAD/SET Patient?  No      Pain Assessment   Currently in Pain?  No/denies    Pain Score  0-No pain    Multiple Pain Sites  No       Capillary Blood Glucose: No results found for this or any previous visit (from the past 24 hour(s)).    Social History   Tobacco Use  Smoking Status Former Smoker  . Packs/day: 1.00  . Years: 35.00  . Pack years: 35.00  . Types: Cigarettes  . Start date: 09/18/1967  . Quit date: 06/09/1999  . Years since quitting: 20.2  Smokeless Tobacco Never Used    Goals Met:  Independence with exercise equipment Exercise tolerated well Personal goals reviewed No report of cardiac concerns or symptoms Strength training completed today  Goals Unmet:  Not Applicable  Comments: Check out: 12:00   Mardene Celeste, MD is Cardiac Rehab Medical Director

## 2019-08-29 DIAGNOSIS — H35033 Hypertensive retinopathy, bilateral: Secondary | ICD-10-CM | POA: Diagnosis not present

## 2019-08-29 DIAGNOSIS — H524 Presbyopia: Secondary | ICD-10-CM | POA: Diagnosis not present

## 2019-08-30 ENCOUNTER — Encounter (HOSPITAL_COMMUNITY)
Admission: RE | Admit: 2019-08-30 | Discharge: 2019-08-30 | Disposition: A | Discharge status: Home / Self Care | Payer: Medicare Other | Source: Ambulatory Visit | Attending: Cardiovascular Disease | Admitting: Cardiovascular Disease

## 2019-08-30 ENCOUNTER — Other Ambulatory Visit: Payer: Self-pay

## 2019-08-30 DIAGNOSIS — Z951 Presence of aortocoronary bypass graft: Secondary | ICD-10-CM

## 2019-08-30 NOTE — Progress Notes (Signed)
Daily Session Note  Patient Details  Name: Martin [name] MRN: [id number] Date of Birth: [birth year] Referring Provider:     CARDIAC REHAB [place] from 06/23/2019 in [place]  Referring Provider  Hochrein      Encounter Date: 08/30/2019  Check In: Session Check In - 08/30/19 1100      Check-In   Supervising physician immediately available to respond to emergencies  See telemetry face sheet for immediately available MD    Location  [facility]    Staff Present  Russella Dar, MS, EP, [facility], Exercise Physiologist;Gladie Gravette Wynetta Emery, RN, BSN    Virtual Visit  No    Medication changes reported      No    Fall or balance concerns reported     Yes    Comments  Unsteady gait using a walker. Has poor balance secondary to CVA.    Tobacco Cessation  No Change    Warm-up and Cool-down  Performed as group-led instruction    Resistance Training Performed  Yes    VAD Patient?  No    PAD/SET Patient?  No      Pain Assessment   Currently in Pain?  No/denies    Pain Score  0-No pain    Multiple Pain Sites  No       Capillary Blood Glucose: No results found for this or any previous visit (from the past 24 hour(s)).    Social History   Tobacco Use  Smoking Status Former Smoker  . Packs/day: 1.00  . Years: 35.00  . Pack years: 35.00  . Types: Cigarettes  . Start date: 09/18/1967  . Quit date: 06/09/1999  . Years since quitting: 20.2  Smokeless Tobacco Never Used    Goals Met:  Independence with exercise equipment Exercise tolerated well No report of cardiac concerns or symptoms Strength training completed today  Goals Unmet:  Not Applicable  Comments: Check out 1200.   Dr. Kate Sable is Medical Director for [facility] Cardiac and Pulmonary Rehab.

## 2019-08-31 NOTE — Progress Notes (Signed)
Cardiac Individual Treatment Plan  Patient Details  Name: JOFIEL DENTINO MRN: [id number] Date of Birth: 1955-10-10 Referring Provider:     CARDIAC REHAB PHASE II ORIENTATION from 06/23/2019 in [place]  Referring Provider  [place]      Initial Encounter Date:    CARDIAC REHAB PHASE II ORIENTATION from 06/23/2019 in [facility]  Date  06/23/19      Visit Diagnosis: S/P CABG x 3  Patient's Home Medications on Admission:  Current Outpatient Medications:  .  acetaminophen (TYLENOL) 325 MG tablet, Take 1-2 tablets (325-650 mg total) by mouth every 4 (four) hours as needed for mild pain., Disp:  , Rfl:  .  allopurinol (ZYLOPRIM) 300 MG tablet, Take 300 mg by mouth every morning. , Disp: , Rfl:  .  Amino Acids-Protein Hydrolys (FEEDING SUPPLEMENT, PRO-STAT SUGAR FREE 64,) LIQD, Take 30 mLs by mouth 2 (two) times daily., Disp: 887 mL, Rfl: 0 .  amiodarone (PACERONE) 200 MG tablet, Take 1 tablet (200 mg total) by mouth daily., Disp: 90 tablet, Rfl: 3 .  amLODipine (NORVASC) 5 MG tablet, Take 1 tablet (5 mg total) by mouth daily., Disp: 90 tablet, Rfl: 3 .  aspirin EC 81 MG EC tablet, Take 1 tablet (81 mg total) by mouth daily., Disp: 100 tablet, Rfl: 1 .  atorvastatin (LIPITOR) 80 MG tablet, TAKE 1 TABLET BY MOUTH EVERY DAY, Disp: 90 tablet, Rfl: 1 .  Cholecalciferol (VITAMIN D-3) 1000 UNITS CAPS, Take 1,000 Units by mouth every morning. , Disp: , Rfl:  .  citalopram (CELEXA) 10 MG tablet, TAKE 1 TABLET BY MOUTH EVERY DAY, Disp: 90 tablet, Rfl: 1 .  colchicine 0.6 MG tablet, Take 1 tablet (0.6 mg total) by mouth daily at 6 PM., Disp: 30 tablet, Rfl: 1 .  Cyanocobalamin (VITAMIN B-12) 2500 MCG SUBL, Place 2,500 mcg under the tongue every morning. , Disp: , Rfl:  .  diclofenac sodium (VOLTAREN) 1 % GEL, Apply 2 g topically 4 (four) times daily., Disp: 350 g, Rfl: 1 .  docusate sodium (COLACE) 100 MG capsule, Take 2 capsules (200 mg total) by  mouth daily., Disp: 60 capsule, Rfl: 0 .  ELIQUIS 5 MG TABS tablet, TAKE 1 TABLET BY MOUTH TWICE A DAY, Disp: 180 tablet, Rfl: 2 .  fluticasone (FLONASE) 50 MCG/ACT nasal spray, Place 2 sprays into the nose daily.  , Disp: , Rfl:  .  folic acid (FOLVITE) 1 MG tablet, TAKE ONE TABLET BY MOUTH EVERY DAY (Patient taking differently: Take 1 mg by mouth every morning. ), Disp: 30 tablet, Rfl: 6 .  furosemide (LASIX) 20 MG tablet, Take 1 tablet (20 mg total) by mouth 2 (two) times daily., Disp: 180 tablet, Rfl: 3 .  loratadine (CLARITIN) 10 MG tablet, Take 10 mg by mouth every morning. , Disp: , Rfl:  .  LORazepam (ATIVAN) 0.5 MG tablet, Take 0.5 mg by mouth 2 (two) times daily as needed., Disp: , Rfl:  .  meclizine (ANTIVERT) 32 MG tablet, Take 32 mg by mouth daily as needed for dizziness. , Disp: , Rfl:  .  metoprolol tartrate (LOPRESSOR) 25 MG tablet, Take 1 tablet (25 mg total) by mouth every morning., Disp: 90 tablet, Rfl: 3 .  nitroGLYCERIN (NITROSTAT) 0.4 MG SL tablet, PLACE 1 TABLET (0.4 MG TOTAL) UNDER THE TONGUE EVERY 5 (FIVE) MINUTES AS NEEDED. (Patient taking differently: Place 0.4 mg under the tongue every 5 (five) minutes as needed for chest pain. ), Disp: 25  tablet, Rfl: 3 .  pantoprazole (PROTONIX) 40 MG tablet, Take 1 tablet (40 mg total) by mouth daily., Disp: 30 tablet, Rfl: 0 .  polyethylene glycol (MIRALAX / GLYCOLAX) packet, Take 17 g by mouth 3 (three) times a week. , Disp: , Rfl:  .  potassium chloride SA (KLOR-CON M15) 15 MEQ tablet, Take 2 tablets (30 mEq total) by mouth daily., Disp: 180 tablet, Rfl: 3 .  tamsulosin (FLOMAX) 0.4 MG CAPS capsule, Take 1 capsule (0.4 mg total) by mouth 2 (two) times daily., Disp: 60 capsule, Rfl: 0 .  thiamine 100 MG tablet, Take 1 tablet (100 mg total) by mouth daily., Disp: 30 tablet, Rfl: 0 .  traZODone (DESYREL) 100 MG tablet, TAKE 1 TABLET (100 MG TOTAL) BY MOUTH AT BEDTIME AS NEEDED FOR SLEEP., Disp: 90 tablet, Rfl: 1  Past Medical  History: Past Medical History:  Diagnosis Date  . Atrial fibrillation ([place])    a. dx 10/2017. b. s/p DCCV on 11/19/2017 with successful conversion to NSR but back in AFIB 3 weeks later  . CAD (coronary artery disease)    a. s/p PCI to LAD 2001. b. s/p stenting to the RCA and mid LAD December 2011, residual distal RCA disease treated medically. b. nuc 02/2013 abnormal with fixed defect seen in inferoapical, mid-inferior, and basal inferior walls, indicative of myocardial scar, no evidence of ischemia, EF 41% (Ef 55-60% by echo same time).  . Carotid artery disease ([place])    carotid Doppler course left side 5069% stenosis  . Dysrhythmia    AFib  . Habitual alcohol use   . Hyperlipidemia   . Hypertension   . Low back pain   . OSA on CPAP   . Osteoarthritis   . Polysubstance abuse ([place])    use including marijuana and vicodin,which he obtained from the street  . PVC's (premature ventricular contractions)    status post Holter monitor normal sinus rhythm otherwise asymptomatic PVCs.  . Seasonal allergies     Tobacco Use: Social History   Tobacco Use  Smoking Status Former Smoker  . Packs/day: 1.00  . Years: 35.00  . Pack years: 35.00  . Types: Cigarettes  . Start date: 09/18/1967  . Quit date: 06/09/1999  . Years since quitting: 20.2  Smokeless Tobacco Never Used    Labs: Recent Review Flowsheet Data    Labs for ITP Cardiac and Pulmonary Rehab Latest Ref Rng & Units 03/16/2019 03/16/2019 03/16/2019 03/17/2019 03/18/2019   Cholestrol 0 - 200 mg/dL - - - - -   LDLCALC 0 - 99 mg/dL - - - - -   HDL >40 mg/dL - - - - -   Trlycerides <150 mg/dL - - - - -   Hemoglobin A1c 4.8 - 5.6 % - - - - -   PHART 7.350 - 7.450 - - 7.478(H) - -   PCO2ART 32.0 - 48.0 mmHg - - 32.7 - -   HCO3 20.0 - 28.0 mmol/L - - 24.2 - -   TCO2 22 - 32 mmol/L - - 25 - -   ACIDBASEDEF 0.0 - 2.0 mmol/L - - - - -   O2SAT % 44.3 57.7 93.0 63.0 62.5      Capillary Blood Glucose: Lab Results  Component Value Date    GLUCAP 154 (H) 03/27/2019   GLUCAP 121 (H) 03/27/2019   GLUCAP 184 (H) 03/26/2019   GLUCAP 146 (H) 03/26/2019   GLUCAP 146 (H) 03/26/2019     Exercise Target Goals: Exercise Program  Goal: Individual exercise prescription set using results from initial 6 min walk test and THRR while considering  patient's activity barriers and safety.   Exercise Prescription Goal: Starting with aerobic activity 30 plus minutes a day, 3 days per week for initial exercise prescription. Provide home exercise prescription and guidelines that participant acknowledges understanding prior to discharge.  Activity Barriers & Risk Stratification: Activity Barriers & Cardiac Risk Stratification - 06/23/19 1231      Activity Barriers & Cardiac Risk Stratification   Activity Barriers  Deconditioning;Shortness of Breath;Balance Concerns;Assistive Device    Cardiac Risk Stratification  High       6 Minute Walk: 6 Minute Walk    Row Name 06/23/19 1227         6 Minute Walk   Phase  Initial     Distance  200 feet     Walk Time  3 minutes     # of Rest Breaks  0     MPH  0.38     METS  1.29     RPE  15     Perceived Dyspnea   16     VO2 Peak  1.35     Symptoms  -- SOB, legs tired     Resting HR  49 bpm     Resting BP  148/94     Resting Oxygen Saturation   94 %     Exercise Oxygen Saturation  during 6 min walk  95 %     Max Ex. HR  54 bpm     Max Ex. BP  170/102     2 Minute Post BP  148/90        Oxygen Initial Assessment: Oxygen Initial Assessment - 08/14/19 1119      Home Oxygen   Home Oxygen Device  None    Sleep Oxygen Prescription  CPAP    Liters per minute  12    Home Exercise Oxygen Prescription  None    Home at Rest Exercise Oxygen Prescription  None    Compliance with Home Oxygen Use  Yes      Initial 6 min Walk   Oxygen Used  None      Program Oxygen Prescription   Program Oxygen Prescription  None      Intervention   Short Term Goals  To learn and understand importance of  maintaining oxygen saturations>88%;To learn and demonstrate proper pursed lip breathing techniques or other breathing techniques.    Long  Term Goals  Maintenance of O2 saturations>88%;Exhibits proper breathing techniques, such as pursed lip breathing or other method taught during program session       Oxygen Re-Evaluation:   Oxygen Discharge (Final Oxygen Re-Evaluation):   Initial Exercise Prescription: Initial Exercise Prescription - 06/23/19 1200      Date of Initial Exercise RX and Referring Provider   Date  06/23/19    Referring Provider  [place]    Expected Discharge Date  11/21/19      Recumbant Elliptical   Level  1    RPM  37    Watts  37    Minutes  15    METs  1.8      T5 Nustep   Level  1    SPM  20    Minutes  17    METs  2      Track   Laps  2    Minutes  5    METs  1.5  Prescription Details   Frequency (times per week)  3    Duration  Progress to 30 minutes of continuous aerobic without signs/symptoms of physical distress      Intensity   THRR 40-80% of Max Heartrate  [phone]    Ratings of Perceived Exertion  11-13    Perceived Dyspnea  0-4      Progression   Progression  Continue to progress workloads to maintain intensity without signs/symptoms of physical distress.      Resistance Training   Training Prescription  Yes    Weight  1    Reps  10-15   Patient will do less if he gets too SOB or shoulder pain      Perform Capillary Blood Glucose checks as needed.  Exercise Prescription Changes:  Exercise Prescription Changes    Row Name 07/31/19 1100 08/09/19 1100 08/31/19 1400         Response to Exercise   Blood Pressure (Admit)  142/64  150/60  140/78     Blood Pressure (Exercise)  140/68  132/80  150/60     Blood Pressure (Exit)  122/62  118/62  120/80     Heart Rate (Admit)  67 bpm  58 bpm  76 bpm     Heart Rate (Exercise)  82 bpm  94 bpm  77 bpm     Heart Rate (Exit)  67 bpm  54 bpm  52 bpm     Rating of Perceived  Exertion (Exercise)  --  12  12     Symptoms  --  Balance issues  Balance is getting better       Progression   Progression  Continue to progress workloads to maintain intensity without signs/symptoms of physical distress.  Continue to progress workloads to maintain intensity without signs/symptoms of physical distress.  Continue to progress workloads to maintain intensity without signs/symptoms of physical distress.     Average METs  1.9  2  2.3       Resistance Training   Training Prescription  Yes  Yes  Yes     Weight  1  2  2      Reps  10-15  10-15  10-15     Time  10 Minutes  10 Minutes  10 Minutes       Arm Ergometer   Level  1.5  1.6  1.8     Watts  8  15  20      Minutes  22  22  22      METs  1.8  2.5  2.9       T5 Nustep   Level  1  1  2      SPM  57  89  104     Minutes  17  17  17      METs  1.9  2  2.2       Home Exercise Plan   Plans to continue exercise at  Home (comment)  Home (comment)  Home (comment)     Frequency  Add 2 additional days to program exercise sessions.  Add 2 additional days to program exercise sessions.  Add 3 additional days to program exercise sessions.     Initial Home Exercises Provided  07/10/19  07/10/19  07/10/19        Exercise Comments:  Exercise Comments    Row Name 07/17/19 [id number] 07/31/19 1131 08/09/19 1148 08/14/19 1116 08/31/19 1434   Exercise Comments  Patient is hoping to gain  strength and to get back to work. He is doing well in the program. Progressing very slowly because he is still weak.  Patient is getting better at a slow pace. He is consistent and determined to regain his strength. We will progress him slowly but at a steady pace.  Patient is doing better. Will continue to progress as tolerated.  Today patient came in for his assessment/orientation. he did well. He was also able to complete walk test. Had to rest one time and then was able to start back and finish the test.  Patient is doing well and we will continue to progress as  tolerated.      Exercise Goals and Review:  Exercise Goals    Row Name 06/23/19 1242 08/14/19 1114           Exercise Goals   Increase Physical Activity  Yes  Yes      Intervention  --  Provide advice, education, support and counseling about physical activity/exercise needs.;Develop an individualized exercise prescription for aerobic and resistive training based on initial evaluation findings, risk stratification, comorbidities and participant's personal goals.      Expected Outcomes  Long Term: Add in home exercise to make exercise part of routine and to increase amount of physical activity.;Short Term: Attend rehab on a regular basis to increase amount of physical activity.;Long Term: Exercising regularly at least 3-5 days a week.  Short Term: Attend rehab on a regular basis to increase amount of physical activity.;Long Term: Exercising regularly at least 3-5 days a week.      Increase Strength and Stamina  Yes  Yes      Intervention  Provide advice, education, support and counseling about physical activity/exercise needs.;Develop an individualized exercise prescription for aerobic and resistive training based on initial evaluation findings, risk stratification, comorbidities and participant's personal goals.  Provide advice, education, support and counseling about physical activity/exercise needs.;Develop an individualized exercise prescription for aerobic and resistive training based on initial evaluation findings, risk stratification, comorbidities and participant's personal goals.      Expected Outcomes  Short Term: Increase workloads from initial exercise prescription for resistance, speed, and METs.;Short Term: Perform resistance training exercises routinely during rehab and add in resistance training at home;Long Term: Improve cardiorespiratory fitness, muscular endurance and strength as measured by increased METs and functional capacity (6MWT)  Short Term: Perform resistance training  exercises routinely during rehab and add in resistance training at home;Long Term: Improve cardiorespiratory fitness, muscular endurance and strength as measured by increased METs and functional capacity (6MWT)      Able to understand and use rate of perceived exertion (RPE) scale  Yes  Yes      Intervention  Provide education and explanation on how to use RPE scale  Provide education and explanation on how to use RPE scale      Expected Outcomes  Short Term: Able to use RPE daily in rehab to express subjective intensity level;Long Term:  Able to use RPE to guide intensity level when exercising independently  Short Term: Able to use RPE daily in rehab to express subjective intensity level;Long Term:  Able to use RPE to guide intensity level when exercising independently      Able to understand and use Dyspnea scale  --  Yes      Intervention  --  Provide education and explanation on how to use Dyspnea scale      Expected Outcomes  --  Short Term: Able to use Dyspnea scale daily  in rehab to express subjective sense of shortness of breath during exertion;Long Term: Able to use Dyspnea scale to guide intensity level when exercising independently      Knowledge and understanding of Target Heart Rate Range (THRR)  Yes  Yes      Intervention  Provide education and explanation of THRR including how the numbers were predicted and where they are located for reference  --      Expected Outcomes  Short Term: Able to use daily as guideline for intensity in rehab;Long Term: Able to use THRR to govern intensity when exercising independently  Short Term: Able to use daily as guideline for intensity in rehab;Long Term: Able to use THRR to govern intensity when exercising independently      Able to check pulse independently  Yes  Yes      Intervention  Provide education and demonstration on how to check pulse in carotid and radial arteries.;Review the importance of being able to check your own pulse for safety during  independent exercise  Provide education and demonstration on how to check pulse in carotid and radial arteries.;Review the importance of being able to check your own pulse for safety during independent exercise      Expected Outcomes  Short Term: Able to explain why pulse checking is important during independent exercise;Long Term: Able to check pulse independently and accurately  Short Term: Able to explain why pulse checking is important during independent exercise;Long Term: Able to check pulse independently and accurately      Understanding of Exercise Prescription  Yes  Yes      Intervention  Provide education, explanation, and written materials on patient's individual exercise prescription  Provide education, explanation, and written materials on patient's individual exercise prescription      Expected Outcomes  Short Term: Able to explain program exercise prescription;Long Term: Able to explain home exercise prescription to exercise independently  Short Term: Able to explain program exercise prescription;Long Term: Able to explain home exercise prescription to exercise independently         Exercise Goals Re-Evaluation : Exercise Goals Re-Evaluation    Row Name 07/17/19 [id number] 07/31/19 1129 08/09/19 1145 08/31/19 1429       Exercise Goal Re-Evaluation   Exercise Goals Review  Increase Physical Activity;Increase Strength and Stamina;Understanding of Exercise Prescription;Knowledge and understanding of Target Heart Rate Range (THRR)  Increase Physical Activity;Increase Strength and Stamina;Able to check pulse independently  Increase Physical Activity;Increase Strength and Stamina  Increase Physical Activity;Increase Strength and Stamina    Comments  This is patients first week. Will progress in the next 2 week cycle. He is still very weak. He tries very hard to do the exercises. He is still coming in with his walker.  Patient is gaining strength. He is able to tolerate the exercises and able to  progress. He is still hoping to get back to work. His progress is slow but steady.  Patient is doing better. Able to progress as tolerated. Still not able to do the TM after giving it a try today 08/09/19.  Patient is doing better. His goals are to get stronger and become more independent. He feels he is getting there little by little. He is now driving hisself cardiac rehab.    Expected Outcomes  To gain strength so that he does not need the walker and to possibily get back to work.  To get back to work and to become more independent.  To reach his goals of getting stronger  and being more independent.  To continue to get stronger and to be able to go back to work as a part of his independence.        Discharge Exercise Prescription (Final Exercise Prescription Changes): Exercise Prescription Changes - 08/31/19 1400      Response to Exercise   Blood Pressure (Admit)  140/78    Blood Pressure (Exercise)  150/60    Blood Pressure (Exit)  120/80    Heart Rate (Admit)  76 bpm    Heart Rate (Exercise)  77 bpm    Heart Rate (Exit)  52 bpm    Rating of Perceived Exertion (Exercise)  12    Symptoms  Balance is getting better      Progression   Progression  Continue to progress workloads to maintain intensity without signs/symptoms of physical distress.    Average METs  2.3      Resistance Training   Training Prescription  Yes    Weight  2    Reps  10-15    Time  10 Minutes      Arm Ergometer   Level  1.8    Watts  20    Minutes  22    METs  2.9      T5 Nustep   Level  2    SPM  104    Minutes  17    METs  2.2      Home Exercise Plan   Plans to continue exercise at  Home (comment)    Frequency  Add 3 additional days to program exercise sessions.    Initial Home Exercises Provided  07/10/19       Nutrition:  Target Goals: Understanding of nutrition guidelines, daily intake of sodium 1500mg , cholesterol 200mg , calories 30% from fat and 7% or less from saturated fats, daily to  have 5 or more servings of fruits and vegetables.  Biometrics: Pre Biometrics - 06/23/19 1244      Pre Biometrics   Height  5\' 8"  (1.727 m)    Weight  114.7 kg    Waist Circumference  51 inches    Hip Circumference  48 inches    Waist to Hip Ratio  1.06 %    BMI (Calculated)  38.45    Triceps Skinfold  15 mm    % Body Fat  36.7 %    Grip Strength  17.2 kg    Flexibility  0 in    Single Leg Stand  2.25 seconds        Nutrition Therapy Plan and Nutrition Goals: Nutrition Therapy & Goals - 08/31/19 0809      Personal Nutrition Goals   Comments  We continue to work to schedule RD classes. Patient continues to eat less greasy foods and try to follow a low sodium diet. His medficts score was 6 which indicates he is already following a theraputic diet. Will continue to monitor for progress.      Intervention Plan   Intervention  Nutrition handout(s) given to patient.       Nutrition Assessments: Nutrition Assessments - 06/23/19 1250      MEDFICTS Scores   Pre Score  6       Nutrition Goals Re-Evaluation:   Nutrition Goals Discharge (Final Nutrition Goals Re-Evaluation):   Psychosocial: Target Goals: Acknowledge presence or absence of significant depression and/or stress, maximize coping skills, provide positive support system. Participant is able to verbalize types and ability to use techniques and skills needed  for reducing stress and depression.  Initial Review & Psychosocial Screening: Initial Psych Review & Screening - 08/14/19 1124      Initial Review   Current issues with  None Identified      Family Dynamics   Good Support System?  Yes      Barriers   Psychosocial barriers to participate in program  There are no identifiable barriers or psychosocial needs.      Screening Interventions   Interventions  Encouraged to exercise    Expected Outcomes  Short Term goal: Identification and review with participant of any Quality of Life or Depression concerns  found by scoring the questionnaire.;Long Term goal: The participant improves quality of Life and PHQ9 Scores as seen by post scores and/or verbalization of changes       Quality of Life Scores: Quality of Life - 06/23/19 1248      Quality of Life   Select  Quality of Life      Quality of Life Scores   Health/Function Pre  22.77 %    Socioeconomic Pre  28.29 %    Psych/Spiritual Pre  27.43 %    Family Pre  27.6 %    GLOBAL Pre  25.57 %      Scores of 19 and below usually indicate a poorer quality of life in these areas.  A difference of  2-3 points is a clinically meaningful difference.  A difference of 2-3 points in the total score of the Quality of Life Index has been associated with significant improvement in overall quality of life, self-image, physical symptoms, and general health in studies assessing change in quality of life.  PHQ-9: Recent Review Flowsheet Data    Depression screen [facility] 2/9 08/14/2019 05/31/2019 04/24/2019   Decreased Interest 0 0 0   Down, Depressed, Hopeless 0 0 0   PHQ - 2 Score 0 0 0   Altered sleeping 1 - 1   Tired, decreased energy 1 - 1   Change in appetite 0 - 0   Feeling bad or failure about yourself  0 - 0   Trouble concentrating 0 - 0   Moving slowly or fidgety/restless 0 - 0   Suicidal thoughts 0 - 0   PHQ-9 Score 2 - 2   Difficult doing work/chores Somewhat difficult - Not difficult at all     Interpretation of Total Score  Total Score Depression Severity:  1-4 = Minimal depression, 5-9 = Mild depression, 10-14 = Moderate depression, 15-19 = Moderately severe depression, 20-27 = Severe depression   Psychosocial Evaluation and Intervention: Psychosocial Evaluation - 08/14/19 1125      Psychosocial Evaluation & Interventions   Interventions  Encouraged to exercise with the program and follow exercise prescription    Continue Psychosocial Services   No Follow up required       Psychosocial Re-Evaluation: Psychosocial Re-Evaluation     Row Name 07/12/19 1504 08/09/19 1534 08/31/19 0813         Psychosocial Re-Evaluation   Current issues with  Current Anxiety/Panic  Current Anxiety/Panic  Current Anxiety/Panic     Comments  Patient has GAD managed with Citalopram 10 mg daily and Lorazepam 0.5 mg BID prn. He says his anxiety is controlled. His initial QOL score was 25.72 and his PHQ-9 score was 2. Will continue to monitor.  Patient has GAD managed with Citalopram 10 mg daily and Lorazepam 0.5 mg BID prn. He says his anxiety is controlled. His initial QOL score was 25.[place]  and his PHQ-9 score was 2. Will continue to monitor.  Patient has GAD managed with Citalopram 10 mg daily and Lorazepam 0.5 mg BID prn. He says his anxiety is controlled. His initial QOL score was 25.72 and his PHQ-9 score was 2. Will continue to monitor.     Expected Outcomes  Patient's GAD will continue to be managed with no additional psychosocial issues identified at discharge.  Patient's GAD will continue to be managed with no additional psychosocial issues identified at discharge.  Patient's GAD will continue to be managed with no additional psychosocial issues identified at discharge.     Interventions  Stress management education;Encouraged to attend Cardiac Rehabilitation for the exercise;Relaxation education  Stress management education;Encouraged to attend Cardiac Rehabilitation for the exercise;Relaxation education  Stress management education;Encouraged to attend Cardiac Rehabilitation for the exercise;Relaxation education     Continue Psychosocial Services   Follow up required by staff  Follow up required by staff  Follow up required by staff        Psychosocial Discharge (Final Psychosocial Re-Evaluation): Psychosocial Re-Evaluation - 08/31/19 0813      Psychosocial Re-Evaluation   Current issues with  Current Anxiety/Panic    Comments  Patient has GAD managed with Citalopram 10 mg daily and Lorazepam 0.5 mg BID prn. He says his anxiety is controlled.  His initial QOL score was 25.72 and his PHQ-9 score was 2. Will continue to monitor.    Expected Outcomes  Patient's GAD will continue to be managed with no additional psychosocial issues identified at discharge.    Interventions  Stress management education;Encouraged to attend Cardiac Rehabilitation for the exercise;Relaxation education    Continue Psychosocial Services   Follow up required by staff       Vocational Rehabilitation: Provide vocational rehab assistance to qualifying candidates.   Vocational Rehab Evaluation & Intervention: Vocational Rehab - 06/23/19 1252      Initial Vocational Rehab Evaluation & Intervention   Assessment shows need for Vocational Rehabilitation  No       Education: Education Goals: Education classes will be provided on a weekly basis, covering required topics. Participant will state understanding/return demonstration of topics presented.  Learning Barriers/Preferences: Learning Barriers/Preferences - 06/23/19 1251      Learning Barriers/Preferences   Learning Barriers  None    Learning Preferences  Individual Instruction;Group Instruction;Pictoral;Video;Computer/Internet       Education Topics: Hypertension, Hypertension Reduction -Define heart disease and high blood pressure. Discus how high blood pressure affects the body and ways to reduce high blood pressure.   Exercise and Your Heart -Discuss why it is important to exercise, the FITT principles of exercise, normal and abnormal responses to exercise, and how to exercise safely.   Angina -Discuss definition of angina, causes of angina, treatment of angina, and how to decrease risk of having angina.   Cardiac Medications -Review what the following cardiac medications are used for, how they affect the body, and side effects that may occur when taking the medications.  Medications include Aspirin, Beta blockers, calcium channel blockers, ACE Inhibitors, angiotensin receptor blockers,  diuretics, digoxin, and antihyperlipidemics.   Congestive Heart Failure -Discuss the definition of CHF, how to live with CHF, the signs and symptoms of CHF, and how keep track of weight and sodium intake.   CARDIAC REHAB PHASE II EXERCISE from 08/30/2019 in [place]  Date  07/12/19  Educator  Etheleen Mayhew  Instruction Review Code  2- Demonstrated Understanding      Heart Disease and  Intimacy -Discus the effect sexual activity has on the heart, how changes occur during intimacy as we age, and safety during sexual activity.   Smoking Cessation / COPD -Discuss different methods to quit smoking, the health benefits of quitting smoking, and the definition of COPD.   Nutrition I: Fats -Discuss the types of cholesterol, what cholesterol does to the heart, and how cholesterol levels can be controlled.   CARDIAC REHAB PHASE II EXERCISE from 08/30/2019 in [place]  Date  08/02/19  Educator  Etheleen Mayhew  Instruction Review Code  2- Demonstrated Understanding      Nutrition II: Labels -Discuss the different components of food labels and how to read food label   CARDIAC REHAB PHASE II EXERCISE from 08/30/2019 in [place]  Date  08/09/19  Educator  DC  Instruction Review Code  2- Demonstrated Understanding      Heart Parts/Heart Disease and PAD -Discuss the anatomy of the heart, the pathway of blood circulation through the heart, and these are affected by heart disease.   CARDIAC REHAB PHASE II EXERCISE from 08/30/2019 in [place]  Date  08/16/19  Educator  DC  Instruction Review Code  2- Demonstrated Understanding      Stress I: Signs and Symptoms -Discuss the causes of stress, how stress may lead to anxiety and depression, and ways to limit stress.   CARDIAC REHAB PHASE II EXERCISE from 08/30/2019 in [place]  Date  08/23/19  Educator  Etheleen Mayhew  Instruction  Review Code  2- Demonstrated Understanding      Stress II: Relaxation -Discuss different types of relaxation techniques to limit stress.   CARDIAC REHAB PHASE II EXERCISE from 08/30/2019 in [place]  Date  08/30/19  Educator  Etheleen Mayhew  Instruction Review Code  2- Demonstrated Understanding      Warning Signs of Stroke / TIA -Discuss definition of a stroke, what the signs and symptoms are of a stroke, and how to identify when someone is having stroke.   Knowledge Questionnaire Score: Knowledge Questionnaire Score - 06/23/19 1251      Knowledge Questionnaire Score   Pre Score  21/24       Core Components/Risk Factors/Patient Goals at Admission: Personal Goals and Risk Factors at Admission - 06/23/19 1252      Core Components/Risk Factors/Patient Goals on Admission    Weight Management  Weight Maintenance    Personal Goal Other  Yes    Personal Goal  Get stringer and become more independent    Intervention  Since [facility] is closed at this time. We have given Mr. Buzek a home exercise plan to log his activities in the [employer] app 3 x week. Once we reopen we will call him to bring in the [facility] gym to do 3 x week in gym and 2 x week at home.    Expected Outcomes  To get stronger and to become more independent       Core Components/Risk Factors/Patient Goals Review:  Goals and Risk Factor Review    Row Name 08/09/19 1458 08/31/19 0809           Core Components/Risk Factors/Patient Goals Review   Personal Goals Review  Weight Management/Obesity;Improve shortness of breath with ADL's Get stronger; be more independent. Improve balance.  Weight Management/Obesity;Improve shortness of breath with ADL's Get stronger; Be more independent.      Review  Patient has completed 12 sessions gaining  2 lbs since last 30 day review. He is doing well in the program with progression. He ambulates with a walker. His balance is poor and he is very unsteady ambulating  and standing and pivoting without his walker so much so that we are having him do his warm-up and weight exercises seated. He reports feeling stronger since he has been attending and noting less SOB. He say his PCP from wheezing and SOB a few weeks ago and he adjusted his Furosemide dosage. Patient is pleased with his progress in the program so far. He really wants to improve his balance and stop using the walker. Will continue to monitor for progress.  Patient has completed 21 sessions losing 2 lbs since last 30 day review. He continues to do well in the program with progression. He says he feels so much better and is getting stronger especially in his legs. He is still using his walker to ambulate but he says he does not use it at home. He feels his balance is improving but he still is unsteady with pivoting. He is able to do the warm up and weight resistance exercising standing now. He wants to return to work after he completes the program and feels he will be ready. He is very pleased with his progress in the program. Will continue to monitor for progress.      Expected Outcomes  Patient will continue to attend sessions and complete the program meeting his personal goals.  Patient will continue to attend sessions and complete the program meeting his personal goals.         Core Components/Risk Factors/Patient Goals at Discharge (Final Review):  Goals and Risk Factor Review - 08/31/19 0809      Core Components/Risk Factors/Patient Goals Review   Personal Goals Review  Weight Management/Obesity;Improve shortness of breath with ADL's   Get stronger; Be more independent.   Review  Patient has completed 21 sessions losing 2 lbs since last 30 day review. He continues to do well in the program with progression. He says he feels so much better and is getting stronger especially in his legs. He is still using his walker to ambulate but he says he does not use it at home. He feels his balance is improving but  he still is unsteady with pivoting. He is able to do the warm up and weight resistance exercising standing now. He wants to return to work after he completes the program and feels he will be ready. He is very pleased with his progress in the program. Will continue to monitor for progress.    Expected Outcomes  Patient will continue to attend sessions and complete the program meeting his personal goals.       ITP Comments: ITP Comments    Row Name 06/23/19 1159 07/12/19 1503         ITP Comments  Patient came in today for his orientation/assessment to get started with the virtual program until we reopn. He has had stents in the past in 2001 and 2011. He also had a stroke right after his CABG surgery.  Patient is new to the program completing 2 sessions. He plans to compete the program. Will continue to monitor for progress.         Comments: ITP REVIEW Pt is making expected progress toward Cardiac Rehab goals after completing 21 sessions. Recommend continued exercise, life style modification, education, and increased stamina and strength.

## 2019-09-01 ENCOUNTER — Encounter (HOSPITAL_COMMUNITY)
Admission: RE | Admit: 2019-09-01 | Discharge: 2019-09-01 | Disposition: A | Discharge status: Home / Self Care | Payer: Medicare Other | Source: Ambulatory Visit | Attending: Cardiovascular Disease | Admitting: Cardiovascular Disease

## 2019-09-01 ENCOUNTER — Other Ambulatory Visit: Payer: Self-pay

## 2019-09-01 DIAGNOSIS — Z951 Presence of aortocoronary bypass graft: Secondary | ICD-10-CM

## 2019-09-01 NOTE — Progress Notes (Signed)
Daily Session Note  Patient Details  Name: Martin [name] MRN: [id number] Date of Birth: 1956-03-25 Referring Provider:     CARDIAC REHAB [place] from 06/23/2019 in [place]  Referring Provider  Hochrein      Encounter Date: 09/01/2019  Check In: Session Check In - 09/01/19 1100      Check-In   Supervising physician immediately available to respond to emergencies  See telemetry face sheet for immediately available MD    Location  [facility]    Staff Present  Russella Dar, MS, EP, [facility], Exercise Physiologist;Iridian Reader Wynetta Emery, RN, BSN    Virtual Visit  No    Medication changes reported      No    Fall or balance concerns reported     Yes    Comments  Unsteady gait using a walker. Has poor balance secondary to CVA.    Tobacco Cessation  No Change    Warm-up and Cool-down  Performed as group-led instruction    Resistance Training Performed  Yes    VAD Patient?  No    PAD/SET Patient?  No      Pain Assessment   Currently in Pain?  No/denies    Pain Score  0-No pain    Multiple Pain Sites  No       Capillary Blood Glucose: No results found for this or any previous visit (from the past 24 hour(s)).    Social History   Tobacco Use  Smoking Status Former Smoker  . Packs/day: 1.00  . Years: 35.00  . Pack years: 35.00  . Types: Cigarettes  . Start date: 09/18/1967  . Quit date: 06/09/1999  . Years since quitting: 20.2  Smokeless Tobacco Never Used    Goals Met:  Independence with exercise equipment Exercise tolerated well No report of cardiac concerns or symptoms Strength training completed today  Goals Unmet:  Not Applicable  Comments: Check out 1200.   Dr. Kate Sable is Medical Director for [facility] Cardiac and Pulmonary Rehab.

## 2019-09-04 ENCOUNTER — Encounter (HOSPITAL_COMMUNITY)
Admission: RE | Admit: 2019-09-04 | Discharge: 2019-09-04 | Disposition: A | Discharge status: Home / Self Care | Payer: Medicare Other | Source: Ambulatory Visit | Attending: Cardiovascular Disease | Admitting: Cardiovascular Disease

## 2019-09-04 ENCOUNTER — Other Ambulatory Visit: Payer: Self-pay

## 2019-09-04 DIAGNOSIS — Z951 Presence of aortocoronary bypass graft: Secondary | ICD-10-CM | POA: Diagnosis not present

## 2019-09-04 NOTE — Progress Notes (Signed)
Daily Session Note  Patient Details  Name: Martin [name]  MRN: [id number] Date of Birth: 02/17/1956 Referring Provider:     CARDIAC REHAB [name] from 06/23/2019 in [place]  Referring Provider  Hochrein      Encounter Date: 09/04/2019  Check In: Session Check In - 09/04/19 1110      Check-In   Supervising physician immediately available to respond to emergencies  See telemetry face sheet for immediately available MD    Location  [facility]    Staff Present  Russella Dar, MS, EP, [facility], Exercise Physiologist;Debra Wynetta Emery, RN, BSN    Virtual Visit  No    Medication changes reported      No    Fall or balance concerns reported     No    Tobacco Cessation  No Change    Warm-up and Cool-down  Performed as group-led instruction    Resistance Training Performed  Yes    VAD Patient?  No    PAD/SET Patient?  No      Pain Assessment   Currently in Pain?  No/denies    Pain Score  0-No pain    Multiple Pain Sites  No       Capillary Blood Glucose: No results found for this or any previous visit (from the past 24 hour(s)).    Social History   Tobacco Use  Smoking Status Former Smoker  . Packs/day: 1.00  . Years: 35.00  . Pack years: 35.00  . Types: Cigarettes  . Start date: 09/18/1967  . Quit date: 06/09/1999  . Years since quitting: 20.2  Smokeless Tobacco Never Used    Goals Met:  Independence with exercise equipment Exercise tolerated well Personal goals reviewed No report of cardiac concerns or symptoms Strength training completed today  Goals Unmet:  Not Applicable  Comments: Check out: 1200    Dr. Kate Sable is Medical Director for [place] and Pulmonary Rehab.

## 2019-09-05 DIAGNOSIS — S233XXA Sprain of ligaments of thoracic spine, initial encounter: Secondary | ICD-10-CM | POA: Diagnosis not present

## 2019-09-05 DIAGNOSIS — M9901 Segmental and somatic dysfunction of cervical region: Secondary | ICD-10-CM | POA: Diagnosis not present

## 2019-09-05 DIAGNOSIS — M9903 Segmental and somatic dysfunction of lumbar region: Secondary | ICD-10-CM | POA: Diagnosis not present

## 2019-09-05 DIAGNOSIS — M9902 Segmental and somatic dysfunction of thoracic region: Secondary | ICD-10-CM | POA: Diagnosis not present

## 2019-09-05 DIAGNOSIS — S134XXA Sprain of ligaments of cervical spine, initial encounter: Secondary | ICD-10-CM | POA: Diagnosis not present

## 2019-09-06 ENCOUNTER — Encounter (HOSPITAL_COMMUNITY)
Admission: RE | Admit: 2019-09-06 | Discharge: 2019-09-06 | Disposition: A | Discharge status: Home / Self Care | Payer: Medicare Other | Source: Ambulatory Visit | Attending: Cardiovascular Disease | Admitting: Cardiovascular Disease

## 2019-09-06 ENCOUNTER — Other Ambulatory Visit: Payer: Self-pay

## 2019-09-06 DIAGNOSIS — Z951 Presence of aortocoronary bypass graft: Secondary | ICD-10-CM | POA: Diagnosis not present

## 2019-09-06 NOTE — Progress Notes (Signed)
Daily Session Note  Patient Details  Name: Martin [name] MRN: [id number] Date of Birth: [birth year] Referring Provider:     CARDIAC REHAB [place] from 06/23/2019 in [place]  Referring Provider  Hochrein      Encounter Date: 09/06/2019  Check In: Session Check In - 09/06/19 1114      Check-In   Supervising physician immediately available to respond to emergencies  See telemetry face sheet for immediately available MD    Location  [facility]    Staff Present  Russella Dar, MS, EP, [facility], Exercise Physiologist;Debra Wynetta Emery, RN, BSN    Virtual Visit  No    Medication changes reported      No    Fall or balance concerns reported     Yes    Comments  Had a stroke    Tobacco Cessation  No Change    Warm-up and Cool-down  Performed as group-led instruction    Resistance Training Performed  Yes    VAD Patient?  No    PAD/SET Patient?  No      Pain Assessment   Currently in Pain?  No/denies    Pain Score  0-No pain    Multiple Pain Sites  No       Capillary Blood Glucose: No results found for this or any previous visit (from the past 24 hour(s)).    Social History   Tobacco Use  Smoking Status Former Smoker  . Packs/day: 1.00  . Years: 35.00  . Pack years: 35.00  . Types: Cigarettes  . Start date: 09/18/1967  . Quit date: 06/09/1999  . Years since quitting: 20.2  Smokeless Tobacco Never Used    Goals Met:  Independence with exercise equipment Exercise tolerated well Personal goals reviewed No report of cardiac concerns or symptoms Strength training completed today  Goals Unmet:  Not Applicable  Comments: Check out: 1200    Dr. Kate Sable is Medical Director for [place] and Pulmonary Rehab.

## 2019-09-08 ENCOUNTER — Encounter (HOSPITAL_COMMUNITY)
Admission: RE | Admit: 2019-09-08 | Discharge: 2019-09-08 | Disposition: A | Discharge status: Home / Self Care | Payer: Medicare Other | Source: Ambulatory Visit | Attending: Cardiovascular Disease | Admitting: Cardiovascular Disease

## 2019-09-08 ENCOUNTER — Other Ambulatory Visit: Payer: Self-pay

## 2019-09-08 DIAGNOSIS — R5381 Other malaise: Secondary | ICD-10-CM | POA: Diagnosis not present

## 2019-09-08 DIAGNOSIS — Z951 Presence of aortocoronary bypass graft: Secondary | ICD-10-CM | POA: Diagnosis not present

## 2019-09-08 NOTE — Progress Notes (Signed)
Daily Session Note  Patient Details  Name: Martin [name] MRN: [id number] Date of Birth: August 02, 1955 Referring Provider:     CARDIAC REHAB [place] from 06/23/2019 in [place]  Referring Provider  Hochrein      Encounter Date: 09/08/2019  Check In: Session Check In - 09/08/19 1100      Check-In   Supervising physician immediately available to respond to emergencies  See telemetry face sheet for immediately available MD    Location  [facility]    Staff Present  Russella Dar, MS, EP, [facility], Exercise Physiologist;Debra Wynetta Emery, RN, BSN    Virtual Visit  No    Medication changes reported      No    Fall or balance concerns reported     No    Tobacco Cessation  No Change    Warm-up and Cool-down  Performed as group-led instruction    Resistance Training Performed  Yes    VAD Patient?  No    PAD/SET Patient?  No      Pain Assessment   Currently in Pain?  No/denies    Pain Score  0-No pain    Multiple Pain Sites  No       Capillary Blood Glucose: No results found for this or any previous visit (from the past 24 hour(s)).    Social History   Tobacco Use  Smoking Status Former Smoker  . Packs/day: 1.00  . Years: 35.00  . Pack years: 35.00  . Types: Cigarettes  . Start date: 09/18/1967  . Quit date: 06/09/1999  . Years since quitting: 20.2  Smokeless Tobacco Never Used    Goals Met:  Independence with exercise equipment Exercise tolerated well Personal goals reviewed No report of cardiac concerns or symptoms Strength training completed today  Goals Unmet:  Not Applicable  Comments: Check out: 1200   Dr. Kate Sable is Medical Director for [place] and Pulmonary Rehab.

## 2019-09-10 DIAGNOSIS — Z7409 Other reduced mobility: Secondary | ICD-10-CM | POA: Diagnosis not present

## 2019-09-10 DIAGNOSIS — Z951 Presence of aortocoronary bypass graft: Secondary | ICD-10-CM | POA: Diagnosis not present

## 2019-09-10 DIAGNOSIS — I69354 Hemiplegia and hemiparesis following cerebral infarction affecting left non-dominant side: Secondary | ICD-10-CM | POA: Diagnosis not present

## 2019-09-10 DIAGNOSIS — I63331 Cerebral infarction due to thrombosis of right posterior cerebral artery: Secondary | ICD-10-CM | POA: Diagnosis not present

## 2019-09-11 ENCOUNTER — Other Ambulatory Visit: Payer: Self-pay

## 2019-09-11 ENCOUNTER — Encounter (HOSPITAL_COMMUNITY)
Admission: RE | Admit: 2019-09-11 | Discharge: 2019-09-11 | Disposition: A | Discharge status: Home / Self Care | Payer: Medicare Other | Source: Ambulatory Visit | Attending: Cardiovascular Disease | Admitting: Cardiovascular Disease

## 2019-09-11 DIAGNOSIS — Z951 Presence of aortocoronary bypass graft: Secondary | ICD-10-CM

## 2019-09-11 NOTE — Progress Notes (Signed)
Daily Session Note  Patient Details  Name: Martin [name] MRN: [id number] Date of Birth: July 09, 1955 Referring Provider:     CARDIAC REHAB [place] from 06/23/2019 in [place]  Referring Provider  Hochrein      Encounter Date: 09/11/2019  Check In: Session Check In - 09/11/19 1100      Check-In   Supervising physician immediately available to respond to emergencies  See telemetry face sheet for immediately available MD    Location  [facility]    Staff Present  Russella Dar, MS, EP, [facility], Exercise Physiologist;Erionna Strum Wynetta Emery, RN, BSN    Virtual Visit  No    Medication changes reported      No    Fall or balance concerns reported     No    Tobacco Cessation  No Change    Warm-up and Cool-down  Performed as group-led instruction    Resistance Training Performed  Yes    VAD Patient?  No    PAD/SET Patient?  No      Pain Assessment   Currently in Pain?  No/denies    Pain Score  0-No pain    Multiple Pain Sites  No       Capillary Blood Glucose: No results found for this or any previous visit (from the past 24 hour(s)).    Social History   Tobacco Use  Smoking Status Former Smoker  . Packs/day: 1.00  . Years: 35.00  . Pack years: 35.00  . Types: Cigarettes  . Start date: 09/18/1967  . Quit date: 06/09/1999  . Years since quitting: 20.2  Smokeless Tobacco Never Used    Goals Met:  Independence with exercise equipment Exercise tolerated well No report of cardiac concerns or symptoms Strength training completed today  Goals Unmet:  Not Applicable  Comments: Check out 1200.   Dr. Kate Sable is Medical Director for [facility] Cardiac and Pulmonary Rehab.

## 2019-09-12 DIAGNOSIS — S134XXA Sprain of ligaments of cervical spine, initial encounter: Secondary | ICD-10-CM | POA: Diagnosis not present

## 2019-09-12 DIAGNOSIS — S233XXA Sprain of ligaments of thoracic spine, initial encounter: Secondary | ICD-10-CM | POA: Diagnosis not present

## 2019-09-12 DIAGNOSIS — M9902 Segmental and somatic dysfunction of thoracic region: Secondary | ICD-10-CM | POA: Diagnosis not present

## 2019-09-12 DIAGNOSIS — M9901 Segmental and somatic dysfunction of cervical region: Secondary | ICD-10-CM | POA: Diagnosis not present

## 2019-09-12 DIAGNOSIS — M9903 Segmental and somatic dysfunction of lumbar region: Secondary | ICD-10-CM | POA: Diagnosis not present

## 2019-09-13 ENCOUNTER — Other Ambulatory Visit: Payer: Self-pay

## 2019-09-13 ENCOUNTER — Encounter (HOSPITAL_COMMUNITY)
Admission: RE | Admit: 2019-09-13 | Discharge: 2019-09-13 | Disposition: A | Discharge status: Home / Self Care | Payer: Medicare Other | Source: Ambulatory Visit | Attending: Cardiovascular Disease | Admitting: Cardiovascular Disease

## 2019-09-13 DIAGNOSIS — Z951 Presence of aortocoronary bypass graft: Secondary | ICD-10-CM

## 2019-09-13 NOTE — Progress Notes (Signed)
Daily Session Note  Patient Details  Name: Martin [name] MRN: [id number] Date of Birth: 05-07-1956 Referring Provider:     CARDIAC REHAB [place] from 06/23/2019 in [place]  Referring Provider  Hochrein      Encounter Date: 09/13/2019  Check In: Session Check In - 09/13/19 1100      Check-In   Supervising physician immediately available to respond to emergencies  See telemetry face sheet for immediately available MD    Location  [facility]    Staff Present  Russella Dar, MS, EP, [facility], Exercise Physiologist;Kambri Dismore Wynetta Emery, RN, BSN    Virtual Visit  No    Medication changes reported      No    Fall or balance concerns reported     Yes    Comments  Has unsteady gait using a walker secondary to CVA.    Tobacco Cessation  No Change    Warm-up and Cool-down  Performed as group-led instruction    Resistance Training Performed  Yes    VAD Patient?  No    PAD/SET Patient?  No      Pain Assessment   Currently in Pain?  No/denies    Pain Score  0-No pain    Multiple Pain Sites  No       Capillary Blood Glucose: No results found for this or any previous visit (from the past 24 hour(s)).    Social History   Tobacco Use  Smoking Status Former Smoker  . Packs/day: 1.00  . Years: 35.00  . Pack years: 35.00  . Types: Cigarettes  . Start date: 09/18/1967  . Quit date: 06/09/1999  . Years since quitting: 20.2  Smokeless Tobacco Never Used    Goals Met:  Independence with exercise equipment Exercise tolerated well No report of cardiac concerns or symptoms Strength training completed today  Goals Unmet:  Not Applicable  Comments: Check out 1200.   Dr. Kate Sable is Medical Director for [facility] Cardiac and Pulmonary Rehab.

## 2019-09-14 DIAGNOSIS — S233XXA Sprain of ligaments of thoracic spine, initial encounter: Secondary | ICD-10-CM | POA: Diagnosis not present

## 2019-09-14 DIAGNOSIS — S134XXA Sprain of ligaments of cervical spine, initial encounter: Secondary | ICD-10-CM | POA: Diagnosis not present

## 2019-09-14 DIAGNOSIS — M9903 Segmental and somatic dysfunction of lumbar region: Secondary | ICD-10-CM | POA: Diagnosis not present

## 2019-09-14 DIAGNOSIS — M9901 Segmental and somatic dysfunction of cervical region: Secondary | ICD-10-CM | POA: Diagnosis not present

## 2019-09-14 DIAGNOSIS — M9902 Segmental and somatic dysfunction of thoracic region: Secondary | ICD-10-CM | POA: Diagnosis not present

## 2019-09-15 ENCOUNTER — Encounter (HOSPITAL_COMMUNITY)
Admission: RE | Admit: 2019-09-15 | Discharge: 2019-09-15 | Disposition: A | Discharge status: Home / Self Care | Payer: Medicare Other | Source: Ambulatory Visit | Attending: Cardiovascular Disease | Admitting: Cardiovascular Disease

## 2019-09-15 ENCOUNTER — Other Ambulatory Visit: Payer: Self-pay

## 2019-09-15 DIAGNOSIS — Z951 Presence of aortocoronary bypass graft: Secondary | ICD-10-CM | POA: Diagnosis not present

## 2019-09-15 NOTE — Progress Notes (Signed)
Daily Session Note  Patient Details  Name: Martin [name] MRN: [id number] Date of Birth: 14-Sep-1955 Referring Provider:     CARDIAC REHAB [place] from 06/23/2019 in [place]  Referring Provider  Hochrein      Encounter Date: 09/15/2019  Check In: Session Check In - 09/15/19 1100      Check-In   Supervising physician immediately available to respond to emergencies  See telemetry face sheet for immediately available MD    Location  [facility]    Staff Present  Russella Dar, MS, EP, [facility], Exercise Physiologist;Merleen Picazo Wynetta Emery, RN, BSN;Other    Virtual Visit  No    Medication changes reported      No    Fall or balance concerns reported     Yes    Comments  Has unsteady gait using a walker secondary to CVA.    Tobacco Cessation  No Change    Warm-up and Cool-down  Performed as group-led instruction    Resistance Training Performed  Yes    VAD Patient?  No    PAD/SET Patient?  No      Pain Assessment   Currently in Pain?  No/denies    Pain Score  0-No pain    Multiple Pain Sites  No       Capillary Blood Glucose: No results found for this or any previous visit (from the past 24 hour(s)).    Social History   Tobacco Use  Smoking Status Former Smoker  . Packs/day: 1.00  . Years: 35.00  . Pack years: 35.00  . Types: Cigarettes  . Start date: 09/18/1967  . Quit date: 06/09/1999  . Years since quitting: 20.2  Smokeless Tobacco Never Used    Goals Met:  Independence with exercise equipment Exercise tolerated well No report of cardiac concerns or symptoms Strength training completed today  Goals Unmet:  Not Applicable  Comments: Check out 1200.   Dr. Kate Sable is Medical Director for [facility] Cardiac and Pulmonary Rehab.

## 2019-09-18 ENCOUNTER — Encounter (HOSPITAL_COMMUNITY)
Admission: RE | Admit: 2019-09-18 | Discharge: 2019-09-18 | Disposition: A | Discharge status: Home / Self Care | Payer: Medicare Other | Source: Ambulatory Visit | Attending: Cardiovascular Disease | Admitting: Cardiovascular Disease

## 2019-09-18 ENCOUNTER — Other Ambulatory Visit: Payer: Self-pay

## 2019-09-18 DIAGNOSIS — Z951 Presence of aortocoronary bypass graft: Secondary | ICD-10-CM

## 2019-09-18 NOTE — Progress Notes (Signed)
Daily Session Note  Patient Details  Name: APOLINAR BERO MRN: [id number] Date of Birth: Oct 19, 1955 Referring Provider:     CARDIAC REHAB PHASE II [place]IENT[place] from 06/23/2019 in [place]  Referring Provider  Hochrein      Encounter Date: 09/18/2019  Check In: Session Check In - 09/18/19 1056      Check-In   Supervising physician immediately available to respond to emergencies  See telemetry face sheet for immediately available MD    Location  [facility]    Staff Present  Russella Dar, MS, EP, [facility], Exercise Physiologist;Kalena Mander Wynetta Emery, RN, BSN;Other    Virtual Visit  No    Medication changes reported      No    Fall or balance concerns reported     Yes    Comments  Has unsteady gait using a walker secondary to CVA.    Tobacco Cessation  No Change    Warm-up and Cool-down  Performed as group-led instruction    Resistance Training Performed  Yes    VAD Patient?  No    PAD/SET Patient?  No      Pain Assessment   Currently in Pain?  No/denies    Pain Score  0-No pain    Multiple Pain Sites  No       Capillary Blood Glucose: No results found for this or any previous visit (from the past 24 hour(s)).    Social History   Tobacco Use  Smoking Status Former Smoker  . Packs/day: 1.00  . Years: 35.00  . Pack years: 35.00  . Types: Cigarettes  . Start date: 09/18/1967  . Quit date: 06/09/1999  . Years since quitting: 20.2  Smokeless Tobacco Never Used    Goals Met:  Independence with exercise equipment Exercise tolerated well No report of cardiac concerns or symptoms Strength training completed today  Goals Unmet:  Not Applicable  Comments: Check out 1200.   Dr. Kate Sable is Medical Director for [facility] Cardiac and Pulmonary Rehab.

## 2019-09-19 DIAGNOSIS — M9902 Segmental and somatic dysfunction of thoracic region: Secondary | ICD-10-CM | POA: Diagnosis not present

## 2019-09-19 DIAGNOSIS — M9901 Segmental and somatic dysfunction of cervical region: Secondary | ICD-10-CM | POA: Diagnosis not present

## 2019-09-19 DIAGNOSIS — M9903 Segmental and somatic dysfunction of lumbar region: Secondary | ICD-10-CM | POA: Diagnosis not present

## 2019-09-19 DIAGNOSIS — S233XXA Sprain of ligaments of thoracic spine, initial encounter: Secondary | ICD-10-CM | POA: Diagnosis not present

## 2019-09-19 DIAGNOSIS — S134XXA Sprain of ligaments of cervical spine, initial encounter: Secondary | ICD-10-CM | POA: Diagnosis not present

## 2019-09-20 ENCOUNTER — Other Ambulatory Visit: Payer: Self-pay

## 2019-09-20 ENCOUNTER — Encounter (HOSPITAL_COMMUNITY)
Admission: RE | Admit: 2019-09-20 | Discharge: 2019-09-20 | Disposition: A | Discharge status: Home / Self Care | Payer: Medicare Other | Source: Ambulatory Visit | Attending: Cardiovascular Disease | Admitting: Cardiovascular Disease

## 2019-09-20 ENCOUNTER — Encounter: Payer: Medicare Other | Admitting: Cardiothoracic Surgery

## 2019-09-20 DIAGNOSIS — Z951 Presence of aortocoronary bypass graft: Secondary | ICD-10-CM

## 2019-09-20 NOTE — Progress Notes (Signed)
Daily Session Note  Patient Details  Name: Martin [name] MRN: [id number] Date of Birth: 04-03-1956 Referring Provider:     CARDIAC REHAB PHASE II ORIENTATION from 06/23/2019 in [employer]  Referring Provider  Hochrein      Encounter Date: 09/20/2019  Check In: Session Check In - 09/20/19 1100      Check-In   Supervising physician immediately available to respond to emergencies  See telemetry face sheet for immediately available MD    Location  [facility]    Staff Present  Russella Dar, MS, EP, [facility], Exercise Physiologist;Debra Wynetta Emery, RN, BSN    Virtual Visit  No    Medication changes reported      No    Fall or balance concerns reported     No    Tobacco Cessation  No Change    Warm-up and Cool-down  Performed as group-led instruction    Resistance Training Performed  Yes    VAD Patient?  No    PAD/SET Patient?  No      Pain Assessment   Currently in Pain?  No/denies    Pain Score  0-No pain    Multiple Pain Sites  No       Capillary Blood Glucose: No results found for this or any previous visit (from the past 24 hour(s)).    Social History   Tobacco Use  Smoking Status Former Smoker  . Packs/day: 1.00  . Years: 35.00  . Pack years: 35.00  . Types: Cigarettes  . Start date: 09/18/1967  . Quit date: 06/09/1999  . Years since quitting: 20.2  Smokeless Tobacco Never Used    Goals Met:  Independence with exercise equipment Exercise tolerated well Personal goals reviewed No report of cardiac concerns or symptoms Strength training completed today  Goals Unmet:  Not Applicable  Comments: Check out: 1200   Dr. Kate Sable is Medical Director for [place] and Pulmonary Rehab.

## 2019-09-21 ENCOUNTER — Ambulatory Visit: Payer: Medicare Other | Admitting: Cardiothoracic Surgery

## 2019-09-21 ENCOUNTER — Ambulatory Visit: Payer: Medicare Other | Admitting: Neurology

## 2019-09-21 ENCOUNTER — Encounter: Payer: Self-pay | Admitting: Cardiothoracic Surgery

## 2019-09-21 ENCOUNTER — Other Ambulatory Visit: Payer: Self-pay

## 2019-09-21 VITALS — BP 142/72 | HR 50 | Temp 96.8°F | Resp 18 | Ht 68.0 in | Wt 247.0 lb

## 2019-09-21 DIAGNOSIS — Z951 Presence of aortocoronary bypass graft: Secondary | ICD-10-CM

## 2019-09-21 DIAGNOSIS — I779 Disorder of arteries and arterioles, unspecified: Secondary | ICD-10-CM | POA: Diagnosis not present

## 2019-09-21 NOTE — Progress Notes (Signed)
PCP is [name], MD Referring Provider is [name], MD  Chief Complaint  Patient presents with  . Routine Post Op    2 month f/u s/p CABG X 3...03/10/19    HPI: Patient returns for final 37-month follow-up after urgent CABG times 11 March 2019.  The patient had a perioperative ischemic stroke with some left sided weakness which is now resolved.  He still attending cardiac rehab at [facility].  He has chronic atrial fibrillation followed by Dr. Lovena Le which is treated effectively with Eliquis and 200 mg daily of amiodarone.  He denies any symptoms of angina CHF or palpitations.  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  We discussed the components of a heart healthy lifestyle including 30 minutes of walking-exercise 5 days a week and heart healthy diet.  He understands importance of taking his aspirin and Lipitor to optimize his bypass grafts patency.  Past Medical History:  Diagnosis Date  . Atrial fibrillation ([place])    a. dx 10/2017. b. s/p DCCV on 11/19/2017 with successful conversion to NSR but back in AFIB 3 weeks later  . CAD (coronary artery disease)    a. s/p PCI to LAD 2001. b. s/p stenting to the RCA and mid LAD December 2011, residual distal RCA disease treated medically. b. nuc 02/2013 abnormal with fixed defect seen in inferoapical, mid-inferior, and basal inferior walls, indicative of myocardial scar, no evidence of ischemia, EF 41% (Ef 55-60% by echo same time).  . Carotid artery disease ([place])    carotid Doppler course left side 5069% stenosis  . Dysrhythmia    AFib  . Habitual alcohol use   . Hyperlipidemia   . Hypertension   . Low back pain   . OSA on CPAP   . Osteoarthritis   . Polysubstance abuse ([place])    use including marijuana and vicodin,which he obtained from the street  . PVC's (premature ventricular contractions)    status post Holter monitor normal sinus rhythm otherwise asymptomatic PVCs.  . Seasonal allergies     Past  Surgical History:  Procedure Laterality Date  . CARDIOVERSION N/A 11/19/2017   Procedure: CARDIOVERSION;  Surgeon: Herminio Commons, MD;  [facility];  Service: Cardiovascular;  Laterality: N/A;  . CORONARY ANGIOPLASTY WITH STENT PLACEMENT  2011  . CORONARY ARTERY BYPASS GRAFT N/A 03/10/2019   Procedure: CORONARY ARTERY BYPASS GRAFTING (CABG)X 3 Using Left Internal Mammary Artery and Right Saphenous Vein Grafts;  Surgeon: Ivin Poot, MD;  Location: [place];  Service: Open Heart Surgery;  Laterality: N/A;  . LEFT HEART CATH AND CORONARY ANGIOGRAPHY N/A 03/08/2019   Procedure: LEFT HEART CATH AND CORONARY ANGIOGRAPHY;  Surgeon: Belva Crome, MD;  Location: [place] [facility];  Service: Cardiovascular;  Laterality: N/A;  . TEE WITHOUT CARDIOVERSION N/A 03/10/2019   Procedure: TRANSESOPHAGEAL ECHOCARDIOGRAM (TEE);  Surgeon: Prescott Gum, Collier Salina, MD;  Location: [place];  Service: Open Heart Surgery;  Laterality: N/A;  . TONSILLECTOMY      Family History  Problem Relation Age of Onset  . Stroke Mother   . Other Mother        small bowel obstruction  . Heart attack Father   . Heart attack Brother   . Healthy Son     Social History Social History   Tobacco Use  . Smoking status: Former Smoker    Packs/day: 1.00    Years: 35.00    Pack years: 35.00  Types: Cigarettes    Start date: 09/18/1967    Quit date: 06/09/1999    Years since quitting: 20.2  . Smokeless tobacco: Never Used  Substance Use Topics  . Alcohol use: Yes    Alcohol/week: 42.0 standard drinks    Types: 42 Shots of liquor per week    Comment: 1/2 gallon og liquor per week  . Drug use: Yes    Types: Marijuana    Comment: smokes marijuana occassionally-last smoked few weeks ago    Current Outpatient Medications  Medication Sig Dispense Refill  . acetaminophen (TYLENOL) 325 MG tablet Take 1-2 tablets (325-650 mg total) by mouth every 4 (four) hours as needed for mild pain.    Marland Kitchen allopurinol (ZYLOPRIM) 300  MG tablet Take 300 mg by mouth every morning.     . Amino Acids-Protein Hydrolys (FEEDING SUPPLEMENT, PRO-STAT SUGAR FREE 64,) LIQD Take 30 mLs by mouth 2 (two) times daily. 887 mL 0  . amiodarone (PACERONE) 200 MG tablet Take 1 tablet (200 mg total) by mouth daily. 90 tablet 3  . amLODipine (NORVASC) 5 MG tablet Take 1 tablet (5 mg total) by mouth daily. 90 tablet 3  . aspirin EC 81 MG EC tablet Take 1 tablet (81 mg total) by mouth daily. 100 tablet 1  . atorvastatin (LIPITOR) 80 MG tablet TAKE 1 TABLET BY MOUTH EVERY DAY 90 tablet 1  . Cholecalciferol (VITAMIN D-3) 1000 UNITS CAPS Take 1,000 Units by mouth every morning.     . citalopram (CELEXA) 10 MG tablet TAKE 1 TABLET BY MOUTH EVERY DAY 90 tablet 1  . colchicine 0.6 MG tablet Take 1 tablet (0.6 mg total) by mouth daily at 6 PM. 30 tablet 1  . Cyanocobalamin (VITAMIN B-12) 2500 MCG SUBL Place 2,500 mcg under the tongue every morning.     . diclofenac sodium (VOLTAREN) 1 % GEL Apply 2 g topically 4 (four) times daily. 350 g 1  . docusate sodium (COLACE) 100 MG capsule Take 2 capsules (200 mg total) by mouth daily. 60 capsule 0  . ELIQUIS 5 MG TABS tablet TAKE 1 TABLET BY MOUTH TWICE A DAY 180 tablet 2  . fluticasone (FLONASE) 50 MCG/ACT nasal spray Place 2 sprays into the nose daily.      . folic acid (FOLVITE) 1 MG tablet TAKE ONE TABLET BY MOUTH EVERY DAY (Patient taking differently: Take 1 mg by mouth every morning. ) 30 tablet 6  . furosemide (LASIX) 20 MG tablet Take 1 tablet (20 mg total) by mouth 2 (two) times daily. 180 tablet 3  . loratadine (CLARITIN) 10 MG tablet Take 10 mg by mouth every morning.     Marland Kitchen LORazepam (ATIVAN) 0.5 MG tablet Take 0.5 mg by mouth 2 (two) times daily as needed.    . meclizine (ANTIVERT) 32 MG tablet Take 32 mg by mouth daily as needed for dizziness.     . metoprolol tartrate (LOPRESSOR) 25 MG tablet Take 1 tablet (25 mg total) by mouth every morning. 90 tablet 3  . nitroGLYCERIN (NITROSTAT) 0.4 MG SL  tablet PLACE 1 TABLET (0.4 MG TOTAL) UNDER THE TONGUE EVERY 5 (FIVE) MINUTES AS NEEDED. (Patient taking differently: Place 0.4 mg under the tongue every 5 (five) minutes as needed for chest pain. ) 25 tablet 3  . pantoprazole (PROTONIX) 40 MG tablet Take 1 tablet (40 mg total) by mouth daily. 30 tablet 0  . polyethylene glycol (MIRALAX / GLYCOLAX) packet Take 17 g by mouth 3 (three) times a  week.     . potassium chloride SA (KLOR-CON M15) 15 MEQ tablet Take 2 tablets (30 mEq total) by mouth daily. 180 tablet 3  . tamsulosin (FLOMAX) 0.4 MG CAPS capsule Take 1 capsule (0.4 mg total) by mouth 2 (two) times daily. 60 capsule 0  . thiamine 100 MG tablet Take 1 tablet (100 mg total) by mouth daily. 30 tablet 0  . traZODone (DESYREL) 100 MG tablet TAKE 1 TABLET (100 MG TOTAL) BY MOUTH AT BEDTIME AS NEEDED FOR SLEEP. 90 tablet 1   No current facility-administered medications for this visit.    Allergies  Allergen Reactions  . Penicillins Hives and Rash    Has patient had a PCN reaction causing immediate rash, facial/tongue/throat swelling, SOB or lightheadedness with hypotension: Unknown Has patient had a PCN reaction causing severe rash involving mucus membranes or skin necrosis: Unknown Has patient had a PCN reaction that required hospitalization: No Has patient had a PCN reaction occurring within the last 10 years: No If all of the above answers are "NO", then may proceed with Cephalosporin use.      Review of Systems   Slight loss of range of motion of his left ankle Trying to lose weight No shortness of breath or ankle edema No presyncope No difficulty swallowing No headache or change in vision No bleeding problems with the Eliquis  BP (!) 142/72 (BP Location: Right Arm, Patient Position: Sitting, Cuff Size: Large)   Pulse (!) 50   Temp (!) 96.8 F (36 C)   Resp 18   Ht 5\' 8"  (1.727 m)   Wt 247 lb (112 kg)   SpO2 96% Comment: RA  BMI 37.56 kg/m  Physical Exam        Exam    General- alert and comfortable    Neck- no JVD, no cervical adenopathy palpable, no carotid bruit   Lungs- clear without rales, wheezes   Cor- irregular rate and rhythm, no murmur , no gallop   Abdomen- soft, non-tender   Extremities - warm, non-tender, minimal edema   Neuro- oriented, appropriate, no focal weakness   Diagnostic Tests: None  Impression: Doing well after 6 months following urgent CABG x3 Chronic atrial fibrillation Hypertension  Plan: He will continue his current medications and be followed by his cardiologist and primary care physicians.  He will return here as needed.   Len Childs, MD [facility] [phone]

## 2019-09-22 ENCOUNTER — Encounter (HOSPITAL_COMMUNITY)
Admission: RE | Admit: 2019-09-22 | Discharge: 2019-09-22 | Disposition: A | Discharge status: Home / Self Care | Payer: Medicare Other | Source: Ambulatory Visit | Attending: Cardiovascular Disease | Admitting: Cardiovascular Disease

## 2019-09-22 DIAGNOSIS — Z951 Presence of aortocoronary bypass graft: Secondary | ICD-10-CM | POA: Diagnosis not present

## 2019-09-22 NOTE — Progress Notes (Signed)
Daily Session Note  Patient Details  Name: Martin [name] MRN: [id number] Date of Birth: 03-30-1956 Referring Provider:     CARDIAC REHAB [place] from 06/23/2019 in [place]  Referring Provider  Hochrein      Encounter Date: 09/22/2019  Check In: Session Check In - 09/22/19 1100      Check-In   Supervising physician immediately available to respond to emergencies  See telemetry face sheet for immediately available MD    Location  [facility]    Staff Present  Russella Dar, MS, EP, [facility], Exercise Physiologist;Aisling Emigh Wynetta Emery, RN, BSN    Virtual Visit  No    Medication changes reported      No    Fall or balance concerns reported     Yes    Comments  Has unsteady gait using a walker secondary to CVA.    Tobacco Cessation  No Change    Warm-up and Cool-down  Performed as group-led instruction    Resistance Training Performed  Yes    VAD Patient?  No    PAD/SET Patient?  No      Pain Assessment   Currently in Pain?  No/denies    Pain Score  0-No pain    Multiple Pain Sites  No       Capillary Blood Glucose: No results found for this or any previous visit (from the past 24 hour(s)).    Social History   Tobacco Use  Smoking Status Former Smoker  . Packs/day: 1.00  . Years: 35.00  . Pack years: 35.00  . Types: Cigarettes  . Start date: 09/18/1967  . Quit date: 06/09/1999  . Years since quitting: 20.3  Smokeless Tobacco Never Used    Goals Met:  Independence with exercise equipment Exercise tolerated well No report of cardiac concerns or symptoms Strength training completed today  Goals Unmet:  Not Applicable  Comments: Check out 1200.   Dr. Kate Sable is Medical Director for [facility] Cardiac and Pulmonary Rehab.

## 2019-09-25 ENCOUNTER — Encounter (HOSPITAL_COMMUNITY)
Admission: RE | Admit: 2019-09-25 | Discharge: 2019-09-25 | Disposition: A | Discharge status: Home / Self Care | Payer: Medicare Other | Source: Ambulatory Visit | Attending: Cardiovascular Disease | Admitting: Cardiovascular Disease

## 2019-09-25 ENCOUNTER — Other Ambulatory Visit: Payer: Self-pay

## 2019-09-25 DIAGNOSIS — G4733 Obstructive sleep apnea (adult) (pediatric): Secondary | ICD-10-CM | POA: Diagnosis not present

## 2019-09-25 DIAGNOSIS — Z951 Presence of aortocoronary bypass graft: Secondary | ICD-10-CM

## 2019-09-25 NOTE — Progress Notes (Signed)
Daily Session Note  Patient Details  Name: Martin [name] MRN: [id number] Date of Birth: 29-Mar-1956 Referring Provider:     CARDIAC REHAB [place] from 06/23/2019 in [place]  Referring Provider  Hochrein      Encounter Date: 09/25/2019  Check In: Session Check In - 09/25/19 1100      Check-In   Supervising physician immediately available to respond to emergencies  See telemetry face sheet for immediately available MD    Location  [facility]    Staff Present  Russella Dar, MS, EP, [facility], Exercise Physiologist;Aune Adami Wynetta Emery, RN, BSN    Virtual Visit  No    Medication changes reported      No    Fall or balance concerns reported     Yes    Comments  Has unsteady gait using a walker secondary to CVA.    Tobacco Cessation  No Change    Warm-up and Cool-down  Performed as group-led instruction    Resistance Training Performed  Yes    VAD Patient?  No    PAD/SET Patient?  No      Pain Assessment   Currently in Pain?  No/denies    Pain Score  0-No pain    Multiple Pain Sites  No       Capillary Blood Glucose: No results found for this or any previous visit (from the past 24 hour(s)).    Social History   Tobacco Use  Smoking Status Former Smoker  . Packs/day: 1.00  . Years: 35.00  . Pack years: 35.00  . Types: Cigarettes  . Start date: 09/18/1967  . Quit date: 06/09/1999  . Years since quitting: 20.3  Smokeless Tobacco Never Used    Goals Met:  Independence with exercise equipment Exercise tolerated well No report of cardiac concerns or symptoms Strength training completed today  Goals Unmet:  Not Applicable  Comments: Check out 1200.   Dr. Kate Sable is Medical Director for [facility] Cardiac and Pulmonary Rehab.

## 2019-09-26 DIAGNOSIS — M9903 Segmental and somatic dysfunction of lumbar region: Secondary | ICD-10-CM | POA: Diagnosis not present

## 2019-09-26 DIAGNOSIS — S134XXA Sprain of ligaments of cervical spine, initial encounter: Secondary | ICD-10-CM | POA: Diagnosis not present

## 2019-09-26 DIAGNOSIS — S233XXA Sprain of ligaments of thoracic spine, initial encounter: Secondary | ICD-10-CM | POA: Diagnosis not present

## 2019-09-26 DIAGNOSIS — M9901 Segmental and somatic dysfunction of cervical region: Secondary | ICD-10-CM | POA: Diagnosis not present

## 2019-09-26 DIAGNOSIS — M9902 Segmental and somatic dysfunction of thoracic region: Secondary | ICD-10-CM | POA: Diagnosis not present

## 2019-09-27 ENCOUNTER — Other Ambulatory Visit: Payer: Self-pay

## 2019-09-27 ENCOUNTER — Encounter (HOSPITAL_COMMUNITY)
Admission: RE | Admit: 2019-09-27 | Discharge: 2019-09-27 | Disposition: A | Discharge status: Home / Self Care | Payer: Medicare Other | Source: Ambulatory Visit | Attending: Cardiovascular Disease | Admitting: Cardiovascular Disease

## 2019-09-27 DIAGNOSIS — Z951 Presence of aortocoronary bypass graft: Secondary | ICD-10-CM

## 2019-09-27 NOTE — Progress Notes (Signed)
Cardiac Individual Treatment Plan  Patient Details  Name: Martin [name] MRN: [id number] Date of Birth: 08-Feb-1956 Referring Provider:     CARDIAC REHAB PHASE II ORIENTATION from 06/23/2019 in [facility]  Referring Provider  [place]      Initial Encounter Date:    CARDIAC REHAB PHASE II ORIENTATION from 06/23/2019 in [facility]  Date  06/23/19      Visit Diagnosis: S/P CABG x 3  Patient's Home Medications on Admission:  Current Outpatient Medications:  .  acetaminophen (TYLENOL) 325 MG tablet, Take 1-2 tablets (325-650 mg total) by mouth every 4 (four) hours as needed for mild pain., Disp:  , Rfl:  .  allopurinol (ZYLOPRIM) 300 MG tablet, Take 300 mg by mouth every morning. , Disp: , Rfl:  .  Amino Acids-Protein Hydrolys (FEEDING SUPPLEMENT, PRO-STAT SUGAR FREE 64,) LIQD, Take 30 mLs by mouth 2 (two) times daily., Disp: 887 mL, Rfl: 0 .  amiodarone (PACERONE) 200 MG tablet, Take 1 tablet (200 mg total) by mouth daily., Disp: 90 tablet, Rfl: 3 .  amLODipine (NORVASC) 5 MG tablet, Take 1 tablet (5 mg total) by mouth daily., Disp: 90 tablet, Rfl: 3 .  aspirin EC 81 MG EC tablet, Take 1 tablet (81 mg total) by mouth daily., Disp: 100 tablet, Rfl: 1 .  atorvastatin (LIPITOR) 80 MG tablet, TAKE 1 TABLET BY MOUTH EVERY DAY, Disp: 90 tablet, Rfl: 1 .  Cholecalciferol (VITAMIN D-3) 1000 UNITS CAPS, Take 1,000 Units by mouth every morning. , Disp: , Rfl:  .  citalopram (CELEXA) 10 MG tablet, TAKE 1 TABLET BY MOUTH EVERY DAY, Disp: 90 tablet, Rfl: 1 .  colchicine 0.6 MG tablet, Take 1 tablet (0.6 mg total) by mouth daily at 6 PM., Disp: 30 tablet, Rfl: 1 .  Cyanocobalamin (VITAMIN B-12) 2500 MCG SUBL, Place 2,500 mcg under the tongue every morning. , Disp: , Rfl:  .  diclofenac sodium (VOLTAREN) 1 % GEL, Apply 2 g topically 4 (four) times daily., Disp: 350 g, Rfl: 1 .  docusate sodium (COLACE) 100 MG capsule, Take 2 capsules (200 mg total) by  mouth daily., Disp: 60 capsule, Rfl: 0 .  ELIQUIS 5 MG TABS tablet, TAKE 1 TABLET BY MOUTH TWICE A DAY, Disp: 180 tablet, Rfl: 2 .  fluticasone (FLONASE) 50 MCG/ACT nasal spray, Place 2 sprays into the nose daily.  , Disp: , Rfl:  .  folic acid (FOLVITE) 1 MG tablet, TAKE ONE TABLET BY MOUTH EVERY DAY (Patient taking differently: Take 1 mg by mouth every morning. ), Disp: 30 tablet, Rfl: 6 .  furosemide (LASIX) 20 MG tablet, Take 1 tablet (20 mg total) by mouth 2 (two) times daily., Disp: 180 tablet, Rfl: 3 .  loratadine (CLARITIN) 10 MG tablet, Take 10 mg by mouth every morning. , Disp: , Rfl:  .  LORazepam (ATIVAN) 0.5 MG tablet, Take 0.5 mg by mouth 2 (two) times daily as needed., Disp: , Rfl:  .  meclizine (ANTIVERT) 32 MG tablet, Take 32 mg by mouth daily as needed for dizziness. , Disp: , Rfl:  .  metoprolol tartrate (LOPRESSOR) 25 MG tablet, Take 1 tablet (25 mg total) by mouth every morning., Disp: 90 tablet, Rfl: 3 .  nitroGLYCERIN (NITROSTAT) 0.4 MG SL tablet, PLACE 1 TABLET (0.4 MG TOTAL) UNDER THE TONGUE EVERY 5 (FIVE) MINUTES AS NEEDED. (Patient taking differently: Place 0.4 mg under the tongue every 5 (five) minutes as needed for chest pain. ), Disp: 25  tablet, Rfl: 3 .  pantoprazole (PROTONIX) 40 MG tablet, Take 1 tablet (40 mg total) by mouth daily., Disp: 30 tablet, Rfl: 0 .  polyethylene glycol (MIRALAX / GLYCOLAX) packet, Take 17 g by mouth 3 (three) times a week. , Disp: , Rfl:  .  potassium chloride SA (KLOR-CON M15) 15 MEQ tablet, Take 2 tablets (30 mEq total) by mouth daily., Disp: 180 tablet, Rfl: 3 .  tamsulosin (FLOMAX) 0.4 MG CAPS capsule, Take 1 capsule (0.4 mg total) by mouth 2 (two) times daily., Disp: 60 capsule, Rfl: 0 .  thiamine 100 MG tablet, Take 1 tablet (100 mg total) by mouth daily., Disp: 30 tablet, Rfl: 0 .  traZODone (DESYREL) 100 MG tablet, TAKE 1 TABLET (100 MG TOTAL) BY MOUTH AT BEDTIME AS NEEDED FOR SLEEP., Disp: 90 tablet, Rfl: 1  Past Medical  History: Past Medical History:  Diagnosis Date  . Atrial fibrillation ([place])    a. dx 10/2017. b. s/p DCCV on 11/19/2017 with successful conversion to NSR but back in AFIB 3 weeks later  . CAD (coronary artery disease)    a. s/p PCI to LAD 2001. b. s/p stenting to the RCA and mid LAD December 2011, residual distal RCA disease treated medically. b. nuc 02/2013 abnormal with fixed defect seen in inferoapical, mid-inferior, and basal inferior walls, indicative of myocardial scar, no evidence of ischemia, EF 41% (Ef 55-60% by echo same time).  . Carotid artery disease ([place])    carotid Doppler course left side 5069% stenosis  . Dysrhythmia    AFib  . Habitual alcohol use   . Hyperlipidemia   . Hypertension   . Low back pain   . OSA on CPAP   . Osteoarthritis   . Polysubstance abuse ([place])    use including marijuana and vicodin,which he obtained from the street  . PVC's (premature ventricular contractions)    status post Holter monitor normal sinus rhythm otherwise asymptomatic PVCs.  . Seasonal allergies     Tobacco Use: Social History   Tobacco Use  Smoking Status Former Smoker  . Packs/day: 1.00  . Years: 35.00  . Pack years: 35.00  . Types: Cigarettes  . Start date: 09/18/1967  . Quit date: 06/09/1999  . Years since quitting: 20.3  Smokeless Tobacco Never Used    Labs: Recent Review Flowsheet Data    Labs for ITP Cardiac and Pulmonary Rehab Latest Ref Rng & Units 03/16/2019 03/16/2019 03/16/2019 03/17/2019 03/18/2019   Cholestrol 0 - 200 mg/dL - - - - -   LDLCALC 0 - 99 mg/dL - - - - -   HDL >40 mg/dL - - - - -   Trlycerides <150 mg/dL - - - - -   Hemoglobin A1c 4.8 - 5.6 % - - - - -   PHART 7.350 - 7.450 - - 7.478(H) - -   PCO2ART 32.0 - 48.0 mmHg - - 32.7 - -   HCO3 20.0 - 28.0 mmol/L - - 24.2 - -   TCO2 22 - 32 mmol/L - - 25 - -   ACIDBASEDEF 0.0 - 2.0 mmol/L - - - - -   O2SAT % 44.3 57.7 93.0 63.0 62.5      Capillary Blood Glucose: Lab Results  Component Value Date    GLUCAP 154 (H) 03/27/2019   GLUCAP 121 (H) 03/27/2019   GLUCAP 184 (H) 03/26/2019   GLUCAP 146 (H) 03/26/2019   GLUCAP 146 (H) 03/26/2019     Exercise Target Goals: Exercise Program  Goal: Individual exercise prescription set using results from initial 6 min walk test and THRR while considering  patient's activity barriers and safety.   Exercise Prescription Goal: Starting with aerobic activity 30 plus minutes a day, 3 days per week for initial exercise prescription. Provide home exercise prescription and guidelines that participant acknowledges understanding prior to discharge.  Activity Barriers & Risk Stratification: Activity Barriers & Cardiac Risk Stratification - 06/23/19 1231      Activity Barriers & Cardiac Risk Stratification   Activity Barriers  Deconditioning;Shortness of Breath;Balance Concerns;Assistive Device    Cardiac Risk Stratification  High       6 Minute Walk: 6 Minute Walk    Row Name 06/23/19 1227         6 Minute Walk   Phase  Initial     Distance  200 feet     Walk Time  3 minutes     # of Rest Breaks  0     MPH  0.38     METS  1.29     RPE  15     Perceived Dyspnea   16     VO2 Peak  1.35     Symptoms  -- SOB, legs tired     Resting HR  49 bpm     Resting BP  148/94     Resting Oxygen Saturation   94 %     Exercise Oxygen Saturation  during 6 min walk  95 %     Max Ex. HR  54 bpm     Max Ex. BP  170/102     2 Minute Post BP  148/90        Oxygen Initial Assessment: Oxygen Initial Assessment - 08/14/19 1119      Home Oxygen   Home Oxygen Device  None    Sleep Oxygen Prescription  CPAP    Liters per minute  12    Home Exercise Oxygen Prescription  None    Home at Rest Exercise Oxygen Prescription  None    Compliance with Home Oxygen Use  Yes      Initial 6 min Walk   Oxygen Used  None      Program Oxygen Prescription   Program Oxygen Prescription  None      Intervention   Short Term Goals  To learn and understand importance of  maintaining oxygen saturations>88%;To learn and demonstrate proper pursed lip breathing techniques or other breathing techniques.    Long  Term Goals  Maintenance of O2 saturations>88%;Exhibits proper breathing techniques, such as pursed lip breathing or other method taught during program session       Oxygen Re-Evaluation:   Oxygen Discharge (Final Oxygen Re-Evaluation):   Initial Exercise Prescription: Initial Exercise Prescription - 06/23/19 1200      Date of Initial Exercise RX and Referring Provider   Date  06/23/19    Referring Provider  [place]    Expected Discharge Date  11/21/19      Recumbant Elliptical   Level  1    RPM  37    Watts  37    Minutes  15    METs  1.8      T5 Nustep   Level  1    SPM  20    Minutes  17    METs  2      Track   Laps  2    Minutes  5    METs  1.5  Prescription Details   Frequency (times per week)  3    Duration  Progress to 30 minutes of continuous aerobic without signs/symptoms of physical distress      Intensity   THRR 40-80% of Max Heartrate  [phone]    Ratings of Perceived Exertion  11-13    Perceived Dyspnea  0-4      Progression   Progression  Continue to progress workloads to maintain intensity without signs/symptoms of physical distress.      Resistance Training   Training Prescription  Yes    Weight  1    Reps  10-15   Patient will do less if he gets too SOB or shoulder pain      Perform Capillary Blood Glucose checks as needed.  Exercise Prescription Changes:  Exercise Prescription Changes    Row Name 07/31/19 1100 08/09/19 1100 08/31/19 1400 09/25/19 1500       Response to Exercise   Blood Pressure (Admit)  142/64  150/60  140/78  110/60    Blood Pressure (Exercise)  140/68  132/80  150/60  110/60    Blood Pressure (Exit)  122/62  118/62  120/80  100/60    Heart Rate (Admit)  67 bpm  58 bpm  76 bpm  62 bpm    Heart Rate (Exercise)  82 bpm  94 bpm  77 bpm  88 bpm    Heart Rate (Exit)  67  bpm  54 bpm  52 bpm  66 bpm    Rating of Perceived Exertion (Exercise)  --  12  12  12     Symptoms  --  Balance issues  Balance is getting better  Balance is getting better      Progression   Progression  Continue to progress workloads to maintain intensity without signs/symptoms of physical distress.  Continue to progress workloads to maintain intensity without signs/symptoms of physical distress.  Continue to progress workloads to maintain intensity without signs/symptoms of physical distress.  Continue to progress workloads to maintain intensity without signs/symptoms of physical distress.    Average METs  1.9  2  2.3  2.9      Resistance Training   Training Prescription  Yes  Yes  Yes  Yes    Weight  1  2  2  2     Reps  10-15  10-15  10-15  10-15    Time  10 Minutes  10 Minutes  10 Minutes  10 Minutes      Arm Ergometer   Level  1.5  1.6  1.8  1.8    Watts  8  15  20  25     Minutes  22  22  22  22     METs  1.8  2.5  2.9  3.5      T5 Nustep   Level  1  1  2  2     SPM  57  89  104  112    Minutes  17  17  17  17     METs  1.9  2  2.2  2.3      Home Exercise Plan   Plans to continue exercise at  Home (comment)  Home (comment)  Home (comment)  Home (comment)    Frequency  Add 2 additional days to program exercise sessions.  Add 2 additional days to program exercise sessions.  Add 3 additional days to program exercise sessions.  Add 2 additional days to program exercise sessions.  Initial Home Exercises Provided  07/10/19  07/10/19  07/10/19  07/10/19       Exercise Comments:  Exercise Comments    Row Name 07/17/19 [id number] 07/31/19 1131 08/09/19 1148 08/14/19 1116 08/31/19 1434   Exercise Comments  Patient is hoping to gain strength and to get back to work. He is doing well in the program. Progressing very slowly because he is still weak.  Patient is getting better at a slow pace. He is consistent and determined to regain his strength. We will progress him slowly but at a steady  pace.  Patient is doing better. Will continue to progress as tolerated.  Today patient came in for his assessment/orientation. he did well. He was also able to complete walk test. Had to rest one time and then was able to start back and finish the test.  Patient is doing well and we will continue to progress as tolerated.   [place] Name 09/25/19 1516           Exercise Comments  Patient is doing so much better. He has gotten stronger, he is now driving himself to [place]. He is helping more around the house and he states almost everytime he comes how much this has helped him. He plans on joining maintenance to continue getting stronger.          Exercise Goals and Review:  Exercise Goals    Row Name 06/23/19 1242 08/14/19 1114           Exercise Goals   Increase Physical Activity  Yes  Yes      Intervention  --  Provide advice, education, support and counseling about physical activity/exercise needs.;Develop an individualized exercise prescription for aerobic and resistive training based on initial evaluation findings, risk stratification, comorbidities and participant's personal goals.      Expected Outcomes  Long Term: Add in home exercise to make exercise part of routine and to increase amount of physical activity.;Short Term: Attend rehab on a regular basis to increase amount of physical activity.;Long Term: Exercising regularly at least 3-5 days a week.  Short Term: Attend rehab on a regular basis to increase amount of physical activity.;Long Term: Exercising regularly at least 3-5 days a week.      Increase Strength and Stamina  Yes  Yes      Intervention  Provide advice, education, support and counseling about physical activity/exercise needs.;Develop an individualized exercise prescription for aerobic and resistive training based on initial evaluation findings, risk stratification, comorbidities and participant's personal goals.  Provide advice, education, support and counseling about physical  activity/exercise needs.;Develop an individualized exercise prescription for aerobic and resistive training based on initial evaluation findings, risk stratification, comorbidities and participant's personal goals.      Expected Outcomes  Short Term: Increase workloads from initial exercise prescription for resistance, speed, and METs.;Short Term: Perform resistance training exercises routinely during rehab and add in resistance training at home;Long Term: Improve cardiorespiratory fitness, muscular endurance and strength as measured by increased METs and functional capacity (6MWT)  Short Term: Perform resistance training exercises routinely during rehab and add in resistance training at home;Long Term: Improve cardiorespiratory fitness, muscular endurance and strength as measured by increased METs and functional capacity (6MWT)      Able to understand and use rate of perceived exertion (RPE) scale  Yes  Yes      Intervention  Provide education and explanation on how to use RPE scale  Provide education and explanation on how to use RPE  scale      Expected Outcomes  Short Term: Able to use RPE daily in rehab to express subjective intensity level;Long Term:  Able to use RPE to guide intensity level when exercising independently  Short Term: Able to use RPE daily in rehab to express subjective intensity level;Long Term:  Able to use RPE to guide intensity level when exercising independently      Able to understand and use Dyspnea scale  --  Yes      Intervention  --  Provide education and explanation on how to use Dyspnea scale      Expected Outcomes  --  Short Term: Able to use Dyspnea scale daily in rehab to express subjective sense of shortness of breath during exertion;Long Term: Able to use Dyspnea scale to guide intensity level when exercising independently      Knowledge and understanding of Target Heart Rate Range (THRR)  Yes  Yes      Intervention  Provide education and explanation of THRR including  how the numbers were predicted and where they are located for reference  --      Expected Outcomes  Short Term: Able to use daily as guideline for intensity in rehab;Long Term: Able to use THRR to govern intensity when exercising independently  Short Term: Able to use daily as guideline for intensity in rehab;Long Term: Able to use THRR to govern intensity when exercising independently      Able to check pulse independently  Yes  Yes      Intervention  Provide education and demonstration on how to check pulse in carotid and radial arteries.;Review the importance of being able to check your own pulse for safety during independent exercise  Provide education and demonstration on how to check pulse in carotid and radial arteries.;Review the importance of being able to check your own pulse for safety during independent exercise      Expected Outcomes  Short Term: Able to explain why pulse checking is important during independent exercise;Long Term: Able to check pulse independently and accurately  Short Term: Able to explain why pulse checking is important during independent exercise;Long Term: Able to check pulse independently and accurately      Understanding of Exercise Prescription  Yes  Yes      Intervention  Provide education, explanation, and written materials on patient's individual exercise prescription  Provide education, explanation, and written materials on patient's individual exercise prescription      Expected Outcomes  Short Term: Able to explain program exercise prescription;Long Term: Able to explain home exercise prescription to exercise independently  Short Term: Able to explain program exercise prescription;Long Term: Able to explain home exercise prescription to exercise independently         Exercise Goals Re-Evaluation : Exercise Goals Re-Evaluation    Row Name 07/17/19 [id number] 07/31/19 1129 08/09/19 1145 08/31/19 1429 09/25/19 1514     Exercise Goal Re-Evaluation   Exercise Goals  Review  Increase Physical Activity;Increase Strength and Stamina;Understanding of Exercise Prescription;Knowledge and understanding of Target Heart Rate Range (THRR)  Increase Physical Activity;Increase Strength and Stamina;Able to check pulse independently  Increase Physical Activity;Increase Strength and Stamina  Increase Physical Activity;Increase Strength and Stamina  Increase Physical Activity;Increase Strength and Stamina   Comments  This is patients first week. Will progress in the next 2 week cycle. He is still very weak. He tries very hard to do the exercises. He is still coming in with his walker.  Patient is gaining strength. He  is able to tolerate the exercises and able to progress. He is still hoping to get back to work. His progress is slow but steady.  Patient is doing better. Able to progress as tolerated. Still not able to do the TM after giving it a try today 08/09/19.  Patient is doing better. His goals are to get stronger and become more independent. He feels he is getting there little by little. He is now driving hisself cardiac rehab.  His personal goals are to get stronger and to become more independent.   Expected Outcomes  To gain strength so that he does not need the walker and to possibily get back to work.  To get back to work and to become more independent.  To reach his goals of getting stronger and being more independent.  To continue to get stronger and to be able to go back to work as a part of his independence.  To reach his expected goals.       Discharge Exercise Prescription (Final Exercise Prescription Changes): Exercise Prescription Changes - 09/25/19 1500      Response to Exercise   Blood Pressure (Admit)  110/60    Blood Pressure (Exercise)  110/60    Blood Pressure (Exit)  100/60    Heart Rate (Admit)  62 bpm    Heart Rate (Exercise)  88 bpm    Heart Rate (Exit)  66 bpm    Rating of Perceived Exertion (Exercise)  12    Symptoms  Balance is getting better       Progression   Progression  Continue to progress workloads to maintain intensity without signs/symptoms of physical distress.    Average METs  2.9      Resistance Training   Training Prescription  Yes    Weight  2    Reps  10-15    Time  10 Minutes      Arm Ergometer   Level  1.8    Watts  25    Minutes  22    METs  3.5      T5 Nustep   Level  2    SPM  112    Minutes  17    METs  2.3      Home Exercise Plan   Plans to continue exercise at  Home (comment)    Frequency  Add 2 additional days to program exercise sessions.    Initial Home Exercises Provided  07/10/19       Nutrition:  Target Goals: Understanding of nutrition guidelines, daily intake of sodium 1500mg , cholesterol 200mg , calories 30% from fat and 7% or less from saturated fats, daily to have 5 or more servings of fruits and vegetables.  Biometrics: Pre Biometrics - 06/23/19 1244      Pre Biometrics   Height  5\' 8"  (1.727 m)    Weight  114.7 kg    Waist Circumference  51 inches    Hip Circumference  48 inches    Waist to Hip Ratio  1.06 %    BMI (Calculated)  38.45    Triceps Skinfold  15 mm    % Body Fat  36.7 %    Grip Strength  17.2 kg    Flexibility  0 in    Single Leg Stand  2.25 seconds        Nutrition Therapy Plan and Nutrition Goals: Nutrition Therapy & Goals - 09/27/19 1246      Personal Nutrition Goals  Comments  We continue to work to schedule RD classes. Patient continues to eat less greasy foods and try to follow a low sodium diet. His medficts score was 6 which indicates he is already following a theraputic diet. Will continue to monitor for progress.      Intervention Plan   Intervention  Nutrition handout(s) given to patient.       Nutrition Assessments: Nutrition Assessments - 06/23/19 1250      MEDFICTS Scores   Pre Score  6       Nutrition Goals Re-Evaluation:   Nutrition Goals Discharge (Final Nutrition Goals Re-Evaluation):   Psychosocial: Target Goals:  Acknowledge presence or absence of significant depression and/or stress, maximize coping skills, provide positive support system. Participant is able to verbalize types and ability to use techniques and skills needed for reducing stress and depression.  Initial Review & Psychosocial Screening: Initial Psych Review & Screening - 08/14/19 1124      Initial Review   Current issues with  None Identified      Family Dynamics   Good Support System?  Yes      Barriers   Psychosocial barriers to participate in program  There are no identifiable barriers or psychosocial needs.      Screening Interventions   Interventions  Encouraged to exercise    Expected Outcomes  Short Term goal: Identification and review with participant of any Quality of Life or Depression concerns found by scoring the questionnaire.;Long Term goal: The participant improves quality of Life and PHQ9 Scores as seen by post scores and/or verbalization of changes       Quality of Life Scores: Quality of Life - 06/23/19 1248      Quality of Life   Select  Quality of Life      Quality of Life Scores   Health/Function Pre  22.77 %    Socioeconomic Pre  28.29 %    Psych/Spiritual Pre  27.43 %    Family Pre  27.6 %    GLOBAL Pre  25.57 %      Scores of 19 and below usually indicate a poorer quality of life in these areas.  A difference of  2-3 points is a clinically meaningful difference.  A difference of 2-3 points in the total score of the Quality of Life Index has been associated with significant improvement in overall quality of life, self-image, physical symptoms, and general health in studies assessing change in quality of life.  PHQ-9: Recent Review Flowsheet Data    Depression screen [facility] 2/9 08/14/2019 05/31/2019 04/24/2019   Decreased Interest 0 0 0   Down, Depressed, Hopeless 0 0 0   PHQ - 2 Score 0 0 0   Altered sleeping 1 - 1   Tired, decreased energy 1 - 1   Change in appetite 0 - 0   Feeling bad or failure  about yourself  0 - 0   Trouble concentrating 0 - 0   Moving slowly or fidgety/restless 0 - 0   Suicidal thoughts 0 - 0   PHQ-9 Score 2 - 2   Difficult doing work/chores Somewhat difficult - Not difficult at all     Interpretation of Total Score  Total Score Depression Severity:  1-4 = Minimal depression, 5-9 = Mild depression, 10-14 = Moderate depression, 15-19 = Moderately severe depression, 20-27 = Severe depression   Psychosocial Evaluation and Intervention: Psychosocial Evaluation - 08/14/19 1125      Psychosocial Evaluation & Interventions  Interventions  Encouraged to exercise with the program and follow exercise prescription    Continue Psychosocial Services   No Follow up required       Psychosocial Re-Evaluation: Psychosocial Re-Evaluation    [place] Name 07/12/19 1504 08/09/19 1534 08/31/19 0813 09/27/19 1250       Psychosocial Re-Evaluation   Current issues with  Current Anxiety/Panic  Current Anxiety/Panic  Current Anxiety/Panic  Current Anxiety/Panic    Comments  Patient has GAD managed with Citalopram 10 mg daily and Lorazepam 0.5 mg BID prn. He says his anxiety is controlled. His initial QOL score was 25.72 and his PHQ-9 score was 2. Will continue to monitor.  Patient has GAD managed with Citalopram 10 mg daily and Lorazepam 0.5 mg BID prn. He says his anxiety is controlled. His initial QOL score was 25.72 and his PHQ-9 score was 2. Will continue to monitor.  Patient has GAD managed with Citalopram 10 mg daily and Lorazepam 0.5 mg BID prn. He says his anxiety is controlled. His initial QOL score was 25.72 and his PHQ-9 score was 2. Will continue to monitor.  Patient has GAD managed with Citalopram 10 mg daily and Lorazepam 0.5 mg BID prn. He says his anxiety is controlled. His initial QOL score was 25.72 and his PHQ-9 score was 2. Will continue to monitor.    Expected Outcomes  Patient's GAD will continue to be managed with no additional psychosocial issues identified at  discharge.  Patient's GAD will continue to be managed with no additional psychosocial issues identified at discharge.  Patient's GAD will continue to be managed with no additional psychosocial issues identified at discharge.  Patient's GAD will continue to be managed with no additional psychosocial issues identified at discharge.    Interventions  Stress management education;Encouraged to attend Cardiac Rehabilitation for the exercise;Relaxation education  Stress management education;Encouraged to attend Cardiac Rehabilitation for the exercise;Relaxation education  Stress management education;Encouraged to attend Cardiac Rehabilitation for the exercise;Relaxation education  Stress management education;Encouraged to attend Cardiac Rehabilitation for the exercise;Relaxation education    Continue Psychosocial Services   Follow up required by staff  Follow up required by staff  Follow up required by staff  Follow up required by staff       Psychosocial Discharge (Final Psychosocial Re-Evaluation): Psychosocial Re-Evaluation - 09/27/19 1250      Psychosocial Re-Evaluation   Current issues with  Current Anxiety/Panic    Comments  Patient has GAD managed with Citalopram 10 mg daily and Lorazepam 0.5 mg BID prn. He says his anxiety is controlled. His initial QOL score was 25.72 and his PHQ-9 score was 2. Will continue to monitor.    Expected Outcomes  Patient's GAD will continue to be managed with no additional psychosocial issues identified at discharge.    Interventions  Stress management education;Encouraged to attend Cardiac Rehabilitation for the exercise;Relaxation education    Continue Psychosocial Services   Follow up required by staff       Vocational Rehabilitation: Provide vocational rehab assistance to qualifying candidates.   Vocational Rehab Evaluation & Intervention: Vocational Rehab - 06/23/19 1252      Initial Vocational Rehab Evaluation & Intervention   Assessment shows need for  Vocational Rehabilitation  No       Education: Education Goals: Education classes will be provided on a weekly basis, covering required topics. Participant will state understanding/return demonstration of topics presented.  Learning Barriers/Preferences: Learning Barriers/Preferences - 06/23/19 1251      Learning Barriers/Preferences   Learning  Barriers  None    Learning Preferences  Individual Instruction;Group Instruction;Pictoral;Video;Computer/Internet       Education Topics: Hypertension, Hypertension Reduction -Define heart disease and high blood pressure. Discus how high blood pressure affects the body and ways to reduce high blood pressure.   CARDIAC REHAB PHASE II EXERCISE from 09/20/2019 in [place]  Date  09/13/19  Educator  Etheleen Mayhew  Instruction Review Code  2- Demonstrated Understanding      Exercise and Your Heart -Discuss why it is important to exercise, the FITT principles of exercise, normal and abnormal responses to exercise, and how to exercise safely.   CARDIAC REHAB PHASE II EXERCISE from 09/20/2019 in [place]  Date  09/20/19  Educator  DC  Instruction Review Code  2- Demonstrated Understanding      Angina -Discuss definition of angina, causes of angina, treatment of angina, and how to decrease risk of having angina.   Cardiac Medications -Review what the following cardiac medications are used for, how they affect the body, and side effects that may occur when taking the medications.  Medications include Aspirin, Beta blockers, calcium channel blockers, ACE Inhibitors, angiotensin receptor blockers, diuretics, digoxin, and antihyperlipidemics.   Congestive Heart Failure -Discuss the definition of CHF, how to live with CHF, the signs and symptoms of CHF, and how keep track of weight and sodium intake.   CARDIAC REHAB PHASE II EXERCISE from 09/20/2019 in [place]  Date  07/12/19   Educator  Etheleen Mayhew  Instruction Review Code  2- Demonstrated Understanding      Heart Disease and Intimacy -Discus the effect sexual activity has on the heart, how changes occur during intimacy as we age, and safety during sexual activity.   Smoking Cessation / COPD -Discuss different methods to quit smoking, the health benefits of quitting smoking, and the definition of COPD.   Nutrition I: Fats -Discuss the types of cholesterol, what cholesterol does to the heart, and how cholesterol levels can be controlled.   CARDIAC REHAB PHASE II EXERCISE from 09/20/2019 in [place]  Date  08/02/19  Educator  Etheleen Mayhew  Instruction Review Code  2- Demonstrated Understanding      Nutrition II: Labels -Discuss the different components of food labels and how to read food label   CARDIAC REHAB PHASE II EXERCISE from 09/20/2019 in [place]  Date  08/09/19  Educator  DC  Instruction Review Code  2- Demonstrated Understanding      Heart Parts/Heart Disease and PAD -Discuss the anatomy of the heart, the pathway of blood circulation through the heart, and these are affected by heart disease.   CARDIAC REHAB PHASE II EXERCISE from 09/20/2019 in [place]  Date  08/16/19  Educator  DC  Instruction Review Code  2- Demonstrated Understanding      Stress I: Signs and Symptoms -Discuss the causes of stress, how stress may lead to anxiety and depression, and ways to limit stress.   CARDIAC REHAB PHASE II EXERCISE from 09/20/2019 in [place]  Date  08/23/19  Educator  Etheleen Mayhew  Instruction Review Code  2- Demonstrated Understanding      Stress II: Relaxation -Discuss different types of relaxation techniques to limit stress.   CARDIAC REHAB PHASE II EXERCISE from 09/20/2019 in [place]  Date  08/30/19  Educator  Etheleen Mayhew  Instruction Review Code  2- Demonstrated  Understanding  Warning Signs of Stroke / TIA -Discuss definition of a stroke, what the signs and symptoms are of a stroke, and how to identify when someone is having stroke.   CARDIAC REHAB PHASE II EXERCISE from 09/20/2019 in [name]  Date  09/06/19  Educator  DC  Instruction Review Code  2- Demonstrated Understanding      Knowledge Questionnaire Score: Knowledge Questionnaire Score - 06/23/19 1251      Knowledge Questionnaire Score   Pre Score  21/24       Core Components/Risk Factors/Patient Goals at Admission: Personal Goals and Risk Factors at Admission - 06/23/19 1252      Core Components/Risk Factors/Patient Goals on Admission    Weight Management  Weight Maintenance    Personal Goal Other  Yes    Personal Goal  Get stringer and become more independent    Intervention  Since [facility] is closed at this time. We have given Mr. Ramaswamy a home exercise plan to log his activities in the [employer] app 3 x week. Once we reopen we will call him to bring in the [facility] gym to do 3 x week in gym and 2 x week at home.    Expected Outcomes  To get stronger and to become more independent       Core Components/Risk Factors/Patient Goals Review:  Goals and Risk Factor Review    Row Name 08/09/19 1458 08/31/19 0809 09/27/19 1246         Core Components/Risk Factors/Patient Goals Review   Personal Goals Review  Weight Management/Obesity;Improve shortness of breath with ADL's Get stronger; be more independent. Improve balance.  Weight Management/Obesity;Improve shortness of breath with ADL's Get stronger; Be more independent.  Weight Management/Obesity;Improve shortness of breath with ADL's Get stronger; be more independent. Improve balance. .     Review  Patient has completed 12 sessions gaining 2 lbs since last 30 day review. He is doing well in the program with progression. He ambulates with a walker. His balance is poor and he is very unsteady ambulating and  standing and pivoting without his walker so much so that we are having him do his warm-up and weight exercises seated. He reports feeling stronger since he has been attending and noting less SOB. He say his PCP from wheezing and SOB a few weeks ago and he adjusted his Furosemide dosage. Patient is pleased with his progress in the program so far. He really wants to improve his balance and stop using the walker. Will continue to monitor for progress.  Patient has completed 21 sessions losing 2 lbs since last 30 day review. He continues to do well in the program with progression. He says he feels so much better and is getting stronger especially in his legs. He is still using his walker to ambulate but he says he does not use it at home. He feels his balance is improving but he still is unsteady with pivoting. He is able to do the warm up and weight resistance exercising standing now. He wants to return to work after he completes the program and feels he will be ready. He is very pleased with his progress in the program. Will continue to monitor for progress.  Patient has completed 33 sessions losing 2.1 lbs since last 30 day review. He continues to do very well in the program with progressions. He continues to reports improvement in his balance and says he is using his walker less and less. He does not  use it at all at home. His balance is improving. He says he feels his overall fitness has improved and he is albe to do more for himself now feeling more independent. He says he is going back to work when he finishes the program. He is very pleased with his progress in the program. Will continue to monitor.     Expected Outcomes  Patient will continue to attend sessions and complete the program meeting his personal goals.  Patient will continue to attend sessions and complete the program meeting his personal goals.  Patient will continue to attend sessions and complete the program meeting his personal goals.         Core Components/Risk Factors/Patient Goals at Discharge (Final Review):  Goals and Risk Factor Review - 09/27/19 1246      Core Components/Risk Factors/Patient Goals Review   Personal Goals Review  Weight Management/Obesity;Improve shortness of breath with ADL's   Get stronger; be more independent. Improve balance. .   Review  Patient has completed 33 sessions losing 2.1 lbs since last 30 day review. He continues to do very well in the program with progressions. He continues to reports improvement in his balance and says he is using his walker less and less. He does not use it at all at home. His balance is improving. He says he feels his overall fitness has improved and he is albe to do more for himself now feeling more independent. He says he is going back to work when he finishes the program. He is very pleased with his progress in the program. Will continue to monitor.    Expected Outcomes  Patient will continue to attend sessions and complete the program meeting his personal goals.       ITP Comments: ITP Comments    Row Name 06/23/19 1159 07/12/19 1503         ITP Comments  Patient came in today for his orientation/assessment to get started with the virtual program until we reopn. He has had stents in the past in 2001 and 2011. He also had a stroke right after his CABG surgery.  Patient is new to the program completing 2 sessions. He plans to compete the program. Will continue to monitor for progress.         Comments: ITP REVIEW Pt is making expected progress toward Cardiac Rehab goals after completing 33 sessions. Recommend continued exercise, life style modification, education, and increased stamina and strength.

## 2019-09-29 ENCOUNTER — Encounter (HOSPITAL_COMMUNITY)
Admission: RE | Admit: 2019-09-29 | Discharge: 2019-09-29 | Disposition: A | Discharge status: Home / Self Care | Payer: Medicare Other | Source: Ambulatory Visit | Attending: Cardiovascular Disease | Admitting: Cardiovascular Disease

## 2019-09-29 ENCOUNTER — Other Ambulatory Visit: Payer: Self-pay

## 2019-09-29 DIAGNOSIS — S134XXA Sprain of ligaments of cervical spine, initial encounter: Secondary | ICD-10-CM | POA: Diagnosis not present

## 2019-09-29 DIAGNOSIS — M9902 Segmental and somatic dysfunction of thoracic region: Secondary | ICD-10-CM | POA: Diagnosis not present

## 2019-09-29 DIAGNOSIS — Z951 Presence of aortocoronary bypass graft: Secondary | ICD-10-CM

## 2019-09-29 DIAGNOSIS — M9903 Segmental and somatic dysfunction of lumbar region: Secondary | ICD-10-CM | POA: Diagnosis not present

## 2019-09-29 DIAGNOSIS — M9901 Segmental and somatic dysfunction of cervical region: Secondary | ICD-10-CM | POA: Diagnosis not present

## 2019-09-29 DIAGNOSIS — S233XXA Sprain of ligaments of thoracic spine, initial encounter: Secondary | ICD-10-CM | POA: Diagnosis not present

## 2019-09-29 NOTE — Progress Notes (Signed)
Daily Session Note  Patient Details  Name: Martin [name] MRN: [id number] Date of Birth: 1956-02-24 Referring Provider:     CARDIAC REHAB PHASE II ORIENTATION from 06/23/2019 in [place]  Referring Provider  Hochrein      Encounter Date: 09/29/2019  Check In: Session Check In - 09/29/19 1100      Check-In   Supervising physician immediately available to respond to emergencies  See telemetry face sheet for immediately available MD    Location  [facility]    Staff Present  Russella Dar, MS, EP, [facility], Exercise Physiologist;Analiyah Lechuga Wynetta Emery, RN, BSN    Virtual Visit  No    Medication changes reported      No    Fall or balance concerns reported     Yes    Comments  Has unsteady gait using a walker secondary to CVA.    Tobacco Cessation  No Change    Warm-up and Cool-down  Performed as group-led instruction    Resistance Training Performed  Yes    VAD Patient?  No    PAD/SET Patient?  No      Pain Assessment   Currently in Pain?  No/denies    Pain Score  0-No pain    Multiple Pain Sites  No       Capillary Blood Glucose: No results found for this or any previous visit (from the past 24 hour(s)).    Social History   Tobacco Use  Smoking Status Former Smoker  . Packs/day: 1.00  . Years: 35.00  . Pack years: 35.00  . Types: Cigarettes  . Start date: 09/18/1967  . Quit date: 06/09/1999  . Years since quitting: 20.3  Smokeless Tobacco Never Used    Goals Met:  Independence with exercise equipment Exercise tolerated well No report of cardiac concerns or symptoms Strength training completed today  Goals Unmet:  Not Applicable  Comments: Check out 1200.   Dr. Kate Sable is Medical Director for [facility] Cardiac and Pulmonary Rehab.

## 2019-10-02 ENCOUNTER — Other Ambulatory Visit: Payer: Self-pay

## 2019-10-02 ENCOUNTER — Encounter (HOSPITAL_COMMUNITY)
Admission: RE | Admit: 2019-10-02 | Discharge: 2019-10-02 | Disposition: A | Discharge status: Home / Self Care | Payer: Medicare Other | Source: Ambulatory Visit | Attending: Cardiovascular Disease | Admitting: Cardiovascular Disease

## 2019-10-02 DIAGNOSIS — Z951 Presence of aortocoronary bypass graft: Secondary | ICD-10-CM | POA: Diagnosis not present

## 2019-10-02 NOTE — Progress Notes (Signed)
Daily Session Note  Patient Details  Name: Martin [name] MRN: [id number] Date of Birth: 27-Jun-1955 Referring Provider:     CARDIAC REHAB [place] from 06/23/2019 in [place]  Referring Provider  Hochrein      Encounter Date: 10/02/2019  Check In: Session Check In - 10/02/19 1100      Check-In   Supervising physician immediately available to respond to emergencies  See telemetry face sheet for immediately available MD    Location  [facility]    Staff Present  Russella Dar, MS, EP, [facility], Exercise Physiologist;Caley Ciaramitaro Wynetta Emery, RN, BSN    Virtual Visit  No    Medication changes reported      No    Fall or balance concerns reported     Yes    Comments  Has unsteady gait using a walker secondary to CVA.    Tobacco Cessation  No Change    Warm-up and Cool-down  Performed as group-led instruction    Resistance Training Performed  Yes    VAD Patient?  No    PAD/SET Patient?  No      Pain Assessment   Currently in Pain?  No/denies    Pain Score  0-No pain    Multiple Pain Sites  No       Capillary Blood Glucose: No results found for this or any previous visit (from the past 24 hour(s)).    Social History   Tobacco Use  Smoking Status Former Smoker  . Packs/day: 1.00  . Years: 35.00  . Pack years: 35.00  . Types: Cigarettes  . Start date: 09/18/1967  . Quit date: 06/09/1999  . Years since quitting: 20.3  Smokeless Tobacco Never Used    Goals Met:  Independence with exercise equipment Exercise tolerated well No report of cardiac concerns or symptoms Strength training completed today  Goals Unmet:  Not Applicable  Comments: Check out 1200.   Dr. Kate Sable is Medical Director for [facility] Cardiac and Pulmonary Rehab.

## 2019-10-02 NOTE — Progress Notes (Signed)
Daily Session Note  Patient Details  Name: Martin [name] MRN: [id number] Date of Birth: 1956/02/06 Referring Provider:     CARDIAC REHAB [place] from 06/23/2019 in [place]  Referring Provider  Hochrein      Encounter Date: 10/02/2019  Check In: Session Check In - 10/02/19 1100      Check-In   Supervising physician immediately available to respond to emergencies  See telemetry face sheet for immediately available MD    Location  [facility]    Staff Present  Russella Dar, MS, EP, [facility], Exercise Physiologist;Shauntee Karp Wynetta Emery, RN, BSN    Virtual Visit  No    Medication changes reported      No    Fall or balance concerns reported     Yes    Comments  Has unsteady gait using a walker secondary to CVA.    Tobacco Cessation  No Change    Warm-up and Cool-down  Performed as group-led instruction    Resistance Training Performed  Yes    VAD Patient?  No    PAD/SET Patient?  No      Pain Assessment   Currently in Pain?  No/denies    Pain Score  0-No pain    Multiple Pain Sites  No       Capillary Blood Glucose: No results found for this or any previous visit (from the past 24 hour(s)).    Social History   Tobacco Use  Smoking Status Former Smoker  . Packs/day: 1.00  . Years: 35.00  . Pack years: 35.00  . Types: Cigarettes  . Start date: 09/18/1967  . Quit date: 06/09/1999  . Years since quitting: 20.3  Smokeless Tobacco Never Used    Goals Met:  Independence with exercise equipment Exercise tolerated well No report of cardiac concerns or symptoms Strength training completed today  Goals Unmet:  Not Applicable  Comments: Check out 1200.   Dr. Kate Sable is Medical Director for [facility] Cardiac and Pulmonary Rehab.

## 2019-10-03 DIAGNOSIS — M9903 Segmental and somatic dysfunction of lumbar region: Secondary | ICD-10-CM | POA: Diagnosis not present

## 2019-10-03 DIAGNOSIS — M9901 Segmental and somatic dysfunction of cervical region: Secondary | ICD-10-CM | POA: Diagnosis not present

## 2019-10-03 DIAGNOSIS — S233XXA Sprain of ligaments of thoracic spine, initial encounter: Secondary | ICD-10-CM | POA: Diagnosis not present

## 2019-10-03 DIAGNOSIS — M9902 Segmental and somatic dysfunction of thoracic region: Secondary | ICD-10-CM | POA: Diagnosis not present

## 2019-10-03 DIAGNOSIS — S134XXA Sprain of ligaments of cervical spine, initial encounter: Secondary | ICD-10-CM | POA: Diagnosis not present

## 2019-10-04 ENCOUNTER — Other Ambulatory Visit: Payer: Self-pay

## 2019-10-04 ENCOUNTER — Encounter (HOSPITAL_COMMUNITY)
Admission: RE | Admit: 2019-10-04 | Discharge: 2019-10-04 | Disposition: A | Discharge status: Home / Self Care | Payer: Medicare Other | Source: Ambulatory Visit | Attending: Cardiology | Admitting: Cardiology

## 2019-10-04 VITALS — Ht 68.0 in | Wt 252.4 lb

## 2019-10-04 DIAGNOSIS — Z951 Presence of aortocoronary bypass graft: Secondary | ICD-10-CM

## 2019-10-04 NOTE — Progress Notes (Signed)
Daily Session Note  Patient Details  Name: Martin [name] MRN: [id number] Date of Birth: 03-May-1956 Referring Provider:     CARDIAC REHAB [place] from 06/23/2019 in [place]  Referring Provider  Hochrein      Encounter Date: 10/04/2019  Check In: Session Check In - 10/04/19 1100      Check-In   Supervising physician immediately available to respond to emergencies  See telemetry face sheet for immediately available MD    Location  [facility]    Staff Present  Russella Dar, MS, EP, [facility], Exercise Physiologist;Quanisha Drewry Wynetta Emery, RN, BSN    Virtual Visit  No    Medication changes reported      No    Fall or balance concerns reported     Yes    Comments  Has unsteady gait using a walker secondary to CVA.    Tobacco Cessation  No Change    Warm-up and Cool-down  Performed as group-led instruction    Resistance Training Performed  Yes    VAD Patient?  No    PAD/SET Patient?  No      Pain Assessment   Currently in Pain?  No/denies    Pain Score  0-No pain    Multiple Pain Sites  No       Capillary Blood Glucose: No results found for this or any previous visit (from the past 24 hour(s)).    Social History   Tobacco Use  Smoking Status Former Smoker  . Packs/day: 1.00  . Years: 35.00  . Pack years: 35.00  . Types: Cigarettes  . Start date: 09/18/1967  . Quit date: 06/09/1999  . Years since quitting: 20.3  Smokeless Tobacco Never Used    Goals Met:  Independence with exercise equipment Exercise tolerated well No report of cardiac concerns or symptoms Strength training completed today  Goals Unmet:  Not Applicable  Comments: Check out 1200.   Dr. Kate Sable is Medical Director for [facility] Cardiac and Pulmonary Rehab.

## 2019-10-06 DIAGNOSIS — M9902 Segmental and somatic dysfunction of thoracic region: Secondary | ICD-10-CM | POA: Diagnosis not present

## 2019-10-06 DIAGNOSIS — M9901 Segmental and somatic dysfunction of cervical region: Secondary | ICD-10-CM | POA: Diagnosis not present

## 2019-10-06 DIAGNOSIS — M9903 Segmental and somatic dysfunction of lumbar region: Secondary | ICD-10-CM | POA: Diagnosis not present

## 2019-10-06 DIAGNOSIS — S233XXA Sprain of ligaments of thoracic spine, initial encounter: Secondary | ICD-10-CM | POA: Diagnosis not present

## 2019-10-06 DIAGNOSIS — S134XXA Sprain of ligaments of cervical spine, initial encounter: Secondary | ICD-10-CM | POA: Diagnosis not present

## 2019-10-08 DIAGNOSIS — R5381 Other malaise: Secondary | ICD-10-CM | POA: Diagnosis not present

## 2019-10-10 DIAGNOSIS — I63331 Cerebral infarction due to thrombosis of right posterior cerebral artery: Secondary | ICD-10-CM | POA: Diagnosis not present

## 2019-10-10 DIAGNOSIS — Z7409 Other reduced mobility: Secondary | ICD-10-CM | POA: Diagnosis not present

## 2019-10-10 DIAGNOSIS — Z951 Presence of aortocoronary bypass graft: Secondary | ICD-10-CM | POA: Diagnosis not present

## 2019-10-10 DIAGNOSIS — I69354 Hemiplegia and hemiparesis following cerebral infarction affecting left non-dominant side: Secondary | ICD-10-CM | POA: Diagnosis not present

## 2019-10-11 DIAGNOSIS — M9902 Segmental and somatic dysfunction of thoracic region: Secondary | ICD-10-CM | POA: Diagnosis not present

## 2019-10-11 DIAGNOSIS — M9901 Segmental and somatic dysfunction of cervical region: Secondary | ICD-10-CM | POA: Diagnosis not present

## 2019-10-11 DIAGNOSIS — S233XXA Sprain of ligaments of thoracic spine, initial encounter: Secondary | ICD-10-CM | POA: Diagnosis not present

## 2019-10-11 DIAGNOSIS — S134XXA Sprain of ligaments of cervical spine, initial encounter: Secondary | ICD-10-CM | POA: Diagnosis not present

## 2019-10-11 DIAGNOSIS — M9903 Segmental and somatic dysfunction of lumbar region: Secondary | ICD-10-CM | POA: Diagnosis not present

## 2019-10-11 NOTE — Progress Notes (Signed)
NEUROLOGY FOLLOW UP OFFICE NOTE  BARD NATTRESS [id number]  HISTORY OF PRESENT ILLNESS: Martin [name] is a 28 year oldright-handed Caucasian male with atrial fibrillation, CAD, HTN, HLD, and OSA on CPAP who follows up for stroke and recurrent syncope.  UPDATE: He underwent 24 hour ambulatory EEG on 06/19/2019 to 06/20/2019 which was normal but did not capture any events.  He is doing well.  No recurrent syncopal spells but he is no longer holding his breath with activity.  He is still involved in rehab  HISTORY: He was admitted to [facility] on 9/29/2020for non-STEMI. He underwent CABG on 03/10/2019.He had episode of hypotension (SBP 70s-90s) on 03/12/2019. On 03/13/2019, he was noted to have left arm and leg weakness. CT of head showed right distal ACA territory infarct. MRI unable to be performed due to recent CABG.REPEAT CT OF THE HEAD THE FOLLOWING DAY SHOWED STABLE SMALL RIGHT ACA INFARCT. CTA of head and neck showedno emergent largevesselocclusion but there was a new partially clipped calcified clot in the right ACA A1. Carotid Doppler showed 1 to 39% stenosis in the bilateral internal carotid arteries. TEE showed ejection fraction 55 to 60% with no cardiac source of embolus. Transcranial Doppler showed no proximal intracranial large vessel stenosis. LDL was 85. Hemoglobin A1c was 5.6. He was on Eliquis prior to admission. As per stroke protocol, he was placed on IV heparin with instructions to transition back to Eliquis. He was continued on Lipitor 80mg  daily.During hospitalization, he had an acute episode of confusion. EEG was normal. He was discharged to inpatient rehab.  Since hospital discharge, he has had two subsequent syncopal episodes that occurred while holding his breath.  He reports that he passed out about a month ago.  He was walking down the hallway and felt lightheadedness, palpitations and tunnel vision.  He felt like he was going to  pass out.  It occurred while he was holding his breath.  He doesn't know why.  He started to sit down but lost consciousness for a few seconds and woke up on the floor.  His wife heard him fall and when she came around, she found him on the floor.  No reported seizure activity.  He noted urinary incontinence.  Did not bite his tongue.  PAST MEDICAL HISTORY: Past Medical History:  Diagnosis Date  . Atrial fibrillation ([place])    a. dx 10/2017. b. s/p DCCV on 11/19/2017 with successful conversion to NSR but back in AFIB 3 weeks later  . CAD (coronary artery disease)    a. s/p PCI to LAD 2001. b. s/p stenting to the RCA and mid LAD December 2011, residual distal RCA disease treated medically. b. nuc 02/2013 abnormal with fixed defect seen in inferoapical, mid-inferior, and basal inferior walls, indicative of myocardial scar, no evidence of ischemia, EF 41% (Ef 55-60% by echo same time).  . Carotid artery disease ([place])    carotid Doppler course left side 5069% stenosis  . Dysrhythmia    AFib  . Habitual alcohol use   . Hyperlipidemia   . Hypertension   . Low back pain   . OSA on CPAP   . Osteoarthritis   . Polysubstance abuse ([place])    use including marijuana and vicodin,which he obtained from the street  . PVC's (premature ventricular contractions)    status post Holter monitor normal sinus rhythm otherwise asymptomatic PVCs.  . Seasonal allergies     MEDICATIONS: Current Outpatient Medications on File Prior to Visit  Medication Sig Dispense Refill  . acetaminophen (TYLENOL) 325 MG tablet Take 1-2 tablets (325-650 mg total) by mouth every 4 (four) hours as needed for mild pain.    Marland Kitchen allopurinol (ZYLOPRIM) 300 MG tablet Take 300 mg by mouth every morning.     . Amino Acids-Protein Hydrolys (FEEDING SUPPLEMENT, PRO-STAT SUGAR FREE 64,) LIQD Take 30 mLs by mouth 2 (two) times daily. 887 mL 0  . amiodarone (PACERONE) 200 MG tablet Take 1 tablet (200 mg total) by mouth daily. 90 tablet 3  .  amLODipine (NORVASC) 5 MG tablet Take 1 tablet (5 mg total) by mouth daily. 90 tablet 3  . aspirin EC 81 MG EC tablet Take 1 tablet (81 mg total) by mouth daily. 100 tablet 1  . atorvastatin (LIPITOR) 80 MG tablet TAKE 1 TABLET BY MOUTH EVERY DAY 90 tablet 1  . Cholecalciferol (VITAMIN D-3) 1000 UNITS CAPS Take 1,000 Units by mouth every morning.     . citalopram (CELEXA) 10 MG tablet TAKE 1 TABLET BY MOUTH EVERY DAY 90 tablet 1  . colchicine 0.6 MG tablet Take 1 tablet (0.6 mg total) by mouth daily at 6 PM. 30 tablet 1  . Cyanocobalamin (VITAMIN B-12) 2500 MCG SUBL Place 2,500 mcg under the tongue every morning.     . diclofenac sodium (VOLTAREN) 1 % GEL Apply 2 g topically 4 (four) times daily. 350 g 1  . docusate sodium (COLACE) 100 MG capsule Take 2 capsules (200 mg total) by mouth daily. 60 capsule 0  . ELIQUIS 5 MG TABS tablet TAKE 1 TABLET BY MOUTH TWICE A DAY 180 tablet 2  . fluticasone (FLONASE) 50 MCG/ACT nasal spray Place 2 sprays into the nose daily.      . folic acid (FOLVITE) 1 MG tablet TAKE ONE TABLET BY MOUTH EVERY DAY (Patient taking differently: Take 1 mg by mouth every morning. ) 30 tablet 6  . furosemide (LASIX) 20 MG tablet Take 1 tablet (20 mg total) by mouth 2 (two) times daily. 180 tablet 3  . loratadine (CLARITIN) 10 MG tablet Take 10 mg by mouth every morning.     Marland Kitchen LORazepam (ATIVAN) 0.5 MG tablet Take 0.5 mg by mouth 2 (two) times daily as needed.    . meclizine (ANTIVERT) 32 MG tablet Take 32 mg by mouth daily as needed for dizziness.     . metoprolol tartrate (LOPRESSOR) 25 MG tablet Take 1 tablet (25 mg total) by mouth every morning. 90 tablet 3  . nitroGLYCERIN (NITROSTAT) 0.4 MG SL tablet PLACE 1 TABLET (0.4 MG TOTAL) UNDER THE TONGUE EVERY 5 (FIVE) MINUTES AS NEEDED. (Patient taking differently: Place 0.4 mg under the tongue every 5 (five) minutes as needed for chest pain. ) 25 tablet 3  . pantoprazole (PROTONIX) 40 MG tablet Take 1 tablet (40 mg total) by mouth  daily. 30 tablet 0  . polyethylene glycol (MIRALAX / GLYCOLAX) packet Take 17 g by mouth 3 (three) times a week.     . potassium chloride SA (KLOR-CON M15) 15 MEQ tablet Take 2 tablets (30 mEq total) by mouth daily. 180 tablet 3  . tamsulosin (FLOMAX) 0.4 MG CAPS capsule Take 1 capsule (0.4 mg total) by mouth 2 (two) times daily. 60 capsule 0  . thiamine 100 MG tablet Take 1 tablet (100 mg total) by mouth daily. 30 tablet 0  . traZODone (DESYREL) 100 MG tablet TAKE 1 TABLET (100 MG TOTAL) BY MOUTH AT BEDTIME AS NEEDED FOR SLEEP. 90 tablet 1  No current facility-administered medications on file prior to visit.    ALLERGIES: Allergies  Allergen Reactions  . Penicillins Hives and Rash    Has patient had a PCN reaction causing immediate rash, facial/tongue/throat swelling, SOB or lightheadedness with hypotension: Unknown Has patient had a PCN reaction causing severe rash involving mucus membranes or skin necrosis: Unknown Has patient had a PCN reaction that required hospitalization: No Has patient had a PCN reaction occurring within the last 10 years: No If all of the above answers are "NO", then may proceed with Cephalosporin use.      FAMILY HISTORY: Family History  Problem Relation Age of Onset  . Stroke Mother   . Other Mother        small bowel obstruction  . Heart attack Father   . Heart attack Brother   . Healthy Son    SOCIAL HISTORY: Social History   Socioeconomic History  . Marital status: Married    Spouse name: Not on file  . Number of children: 1  . Years of education: Not on file  . Highest education level: 9th grade  Occupational History    Employer: UNEMPLOYED    Comment: works Social worker employed  Tobacco Use  . Smoking status: Former Smoker    Packs/day: 1.00    Years: 35.00    Pack years: 35.00    Types: Cigarettes    Start date: 09/18/1967    Quit date: 06/09/1999    Years since quitting: 20.3  . Smokeless tobacco: Never Used  Substance and  Sexual Activity  . Alcohol use: Yes    Alcohol/week: 42.0 standard drinks    Types: 42 Shots of liquor per week    Comment: 1/2 gallon og liquor per week  . Drug use: Yes    Types: Marijuana    Comment: smokes marijuana occassionally-last smoked few weeks ago  . Sexual activity: Yes    Birth control/protection: None  Other Topics Concern  . Not on file  Social History Narrative   No exercise   Pt lives with spouse in 1 story home   Right handed   Drinks coffee every morning, not tea, no soda   One son high level of education is 9th grade   Social Determinants of Health   Financial Resource Strain:   . Difficulty of Paying Living Expenses:   Food Insecurity:   . Worried About Charity fundraiser in the Last Year:   . Arboriculturist in the Last Year:   Transportation Needs:   . Film/video editor (Medical):   Marland Kitchen Lack of Transportation (Non-Medical):   Physical Activity:   . Days of Exercise per Week:   . Minutes of Exercise per Session:   Stress:   . Feeling of Stress :   Social Connections:   . Frequency of Communication with Friends and Family:   . Frequency of Social Gatherings with Friends and Family:   . Attends Religious Services:   . Active Member of Clubs or Organizations:   . Attends Archivist Meetings:   Marland Kitchen Marital Status:   Intimate Partner Violence:   . Fear of Current or Ex-Partner:   . Emotionally Abused:   Marland Kitchen Physically Abused:   . Sexually Abused:     PHYSICAL EXAM: Blood pressure (!) 163/78, pulse (!) 51, height 5\' 7"  (1.702 m), weight 251 lb 9.6 oz (114.1 kg), SpO2 96 %. General: No acute distress.  Patient appears well-groomed.   Head:  Normocephalic/atraumatic  Eyes:  Fundi examined but not visualized Neck: supple, no paraspinal tenderness, full range of motion Heart:  Regular rate and irreg rhythm Lungs:  Clear to auscultation bilaterally Back: No paraspinal tenderness Neurological Exam: alert and oriented to person, place, and  time. Attention span and concentration intact, recent and remote memory intact, fund of knowledge intact.  Speech fluent and not dysarthric, language intact.  CN II-XII intact. Bulk and tone normal, muscle strength 5-/5 left hand grip, otherwise 5/5 throughout.  Sensation to light touch intact.  Deep tendon reflexes 2+ throughout, toes downgoing.  Finger to nose and heel to shin testing intact.  Mildly wide-based gait.   Romberg with mild sway.  IMPRESSION: 1.  Recurrent loss of consciousness.  Endorsed urinary incontinence but otherwise semiology seem more consistent with syncope rather than seizure.  May be vasovagal as they tend to occur in setting of breath-holding/exertion. 2.  Right posterior ACA territory infarct, cryptogenic.  Unclear if may be secondary to afib in setting of holding anticoagulation for CABG 3.  Atrial fibrillation 4.  Hypertension 5.  Hyperlipidemia 6.  OSA on CPAP  PLAN: 1.  Secondary stroke prevention as per PCP/cardiology:  Eliquis, ASA, atorvastatin (LDL goal less than 70), optimize blood pressure control, glycemic control 2.  Follow up 6 months  Metta Clines, DO  CC: Allyn Kenner, MD

## 2019-10-12 ENCOUNTER — Other Ambulatory Visit: Payer: Self-pay

## 2019-10-12 ENCOUNTER — Encounter: Payer: Self-pay | Admitting: Neurology

## 2019-10-12 ENCOUNTER — Ambulatory Visit: Payer: Medicare Other | Admitting: Neurology

## 2019-10-12 VITALS — BP 163/78 | HR 51 | Ht 67.0 in | Wt 251.6 lb

## 2019-10-12 DIAGNOSIS — G4733 Obstructive sleep apnea (adult) (pediatric): Secondary | ICD-10-CM | POA: Diagnosis not present

## 2019-10-12 DIAGNOSIS — I63421 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: Secondary | ICD-10-CM | POA: Diagnosis not present

## 2019-10-12 DIAGNOSIS — E785 Hyperlipidemia, unspecified: Secondary | ICD-10-CM | POA: Diagnosis not present

## 2019-10-12 DIAGNOSIS — I4819 Other persistent atrial fibrillation: Secondary | ICD-10-CM | POA: Diagnosis not present

## 2019-10-12 DIAGNOSIS — Z9989 Dependence on other enabling machines and devices: Secondary | ICD-10-CM

## 2019-10-12 DIAGNOSIS — I10 Essential (primary) hypertension: Secondary | ICD-10-CM | POA: Diagnosis not present

## 2019-10-12 NOTE — Patient Instructions (Signed)
Continue current medications    Follow up in 6 months

## 2019-10-27 NOTE — Addendum Note (Signed)
Encounter addended by: Gean Maidens on: 10/27/2019 3:49 PM  Actions taken: Flowsheet accepted

## 2019-10-29 NOTE — Progress Notes (Addendum)
Cardiology Office Note  Date: 10/29/2019   ID: [name], Martin [birth year], MRN [id number]  PCP:  [name], MD  Cardiologist:  Kate Sable, MD Electrophysiologist:  [name], MD   Chief Complaint: F/U persistent atrial fibrillation, CAD, HTN, HLD, Carotid artery disease  History of Present Illness: Martin [name] is a 64 y.o. male with a history of persistent atrial fibrillation, CAD (CABG [id number] complicated by CVA), HTN, HLD, Carotid artery disease.  Lat encounter with Dr Lovena Le 07/28/2019 via telehealth. He had been participating in rehab. He was bothered by high cost of Eliquis. Stated his BP usually up in the am but normal in afternoon. His HR was well controlled. Had no anginal symptoms or bleeding on Eliquis.   Patient states he has been doing well generally speaking without any progressive anginal or exertional symptoms.  States the only issues he has with balance/dizziness since he had a stroke in October of last year.  He denies any orthostatic or presyncopal/syncopal episodes.  States his blood pressure has been averaging in the [id number] systolic.  He has a phone app which he uses for daily blood pressures and he records them.  He denies any palpitations or arrhythmias, PND or orthopnea, claudication-like issues, DVT or PE-like state issues.  He does have some minor nonpitting lower extremity edema.  He has sleep apnea and is compliant with his CPAP.  He stated he did well in cardiac rehab and is continuing some rehab for his stroke.   Past Medical History:  Diagnosis Date  . Atrial fibrillation ([place])    a. dx 10/2017. b. s/p DCCV on 11/19/2017 with successful conversion to NSR but back in AFIB 3 weeks later  . CAD (coronary artery disease)    a. s/p PCI to LAD 2001. b. s/p stenting to the RCA and mid LAD December 2011, residual distal RCA disease treated medically. b. nuc 02/2013 abnormal with fixed defect seen in inferoapical, mid-inferior, and basal inferior  walls, indicative of myocardial scar, no evidence of ischemia, EF 41% (Ef 55-60% by echo same time).  . Carotid artery disease ([place])    carotid Doppler course left side 5069% stenosis  . Dysrhythmia    AFib  . Habitual alcohol use   . Hyperlipidemia   . Hypertension   . Low back pain   . OSA on CPAP   . Osteoarthritis   . Polysubstance abuse ([place])    use including marijuana and vicodin,which he obtained from the street  . PVC's (premature ventricular contractions)    status post Holter monitor normal sinus rhythm otherwise asymptomatic PVCs.  . Seasonal allergies     Past Surgical History:  Procedure Laterality Date  . CARDIOVERSION N/A 11/19/2017   Procedure: CARDIOVERSION;  Surgeon: Herminio Commons, MD;  Location[facility];  Service: Cardiovascular;  Laterality: N/A;  . CORONARY ANGIOPLASTY WITH STENT PLACEMENT  2011  . CORONARY ARTERY BYPASS GRAFT N/A 03/10/2019   Procedure: CORONARY ARTERY BYPASS GRAFTING (CABG)X 3 Using Left Internal Mammary Artery and Right Saphenous Vein Grafts;  Surgeon: Ivin Poot, MD;  Location: [facility];  Service: Open Heart Surgery;  Laterality: N/A;  . LEFT HEART CATH AND CORONARY ANGIOGRAPHY N/A 03/08/2019   Procedure: LEFT HEART CATH AND CORONARY ANGIOGRAPHY;  Surgeon: Belva Crome, MD;  Location: [facility];  Service: Cardiovascular;  Laterality: N/A;  . TEE WITHOUT CARDIOVERSION N/A 03/10/2019   Procedure: TRANSESOPHAGEAL ECHOCARDIOGRAM (TEE);  Surgeon: Prescott Gum, Collier Salina, MD;  Location: [facility];  Service: Open Heart Surgery;  Laterality: N/A;  . TONSILLECTOMY      Current Outpatient Medications  Medication Sig Dispense Refill  . acetaminophen (TYLENOL) 325 MG tablet Take 1-2 tablets (325-650 mg total) by mouth every 4 (four) hours as needed for mild pain.    Marland Kitchen allopurinol (ZYLOPRIM) 300 MG tablet Take 300 mg by mouth every morning.     . Amino Acids-Protein Hydrolys (FEEDING SUPPLEMENT, PRO-STAT SUGAR FREE 64,) LIQD Take 30 mLs by  mouth 2 (two) times daily. 887 mL 0  . amiodarone (PACERONE) 200 MG tablet Take 1 tablet (200 mg total) by mouth daily. 90 tablet 3  . amLODipine (NORVASC) 5 MG tablet Take 1 tablet (5 mg total) by mouth daily. 90 tablet 3  . aspirin EC 81 MG EC tablet Take 1 tablet (81 mg total) by mouth daily. 100 tablet 1  . atorvastatin (LIPITOR) 80 MG tablet TAKE 1 TABLET BY MOUTH EVERY DAY 90 tablet 1  . Cholecalciferol (VITAMIN D-3) 1000 UNITS CAPS Take 1,000 Units by mouth every morning.     . citalopram (CELEXA) 10 MG tablet TAKE 1 TABLET BY MOUTH EVERY DAY 90 tablet 1  . colchicine 0.6 MG tablet Take 1 tablet (0.6 mg total) by mouth daily at 6 PM. 30 tablet 1  . Cyanocobalamin (VITAMIN B-12) 2500 MCG SUBL Place 2,500 mcg under the tongue every morning.     . diclofenac sodium (VOLTAREN) 1 % GEL Apply 2 g topically 4 (four) times daily. 350 g 1  . docusate sodium (COLACE) 100 MG capsule Take 2 capsules (200 mg total) by mouth daily. 60 capsule 0  . ELIQUIS 5 MG TABS tablet TAKE 1 TABLET BY MOUTH TWICE A DAY 180 tablet 2  . fluticasone (FLONASE) 50 MCG/ACT nasal spray Place 2 sprays into the nose daily.      . folic acid (FOLVITE) 1 MG tablet TAKE ONE TABLET BY MOUTH EVERY DAY (Patient taking differently: Take 1 mg by mouth every morning. ) 30 tablet 6  . furosemide (LASIX) 20 MG tablet Take 1 tablet (20 mg total) by mouth 2 (two) times daily. 180 tablet 3  . loratadine (CLARITIN) 10 MG tablet Take 10 mg by mouth every morning.     Marland Kitchen LORazepam (ATIVAN) 0.5 MG tablet Take 0.5 mg by mouth 2 (two) times daily as needed.    . meclizine (ANTIVERT) 32 MG tablet Take 32 mg by mouth daily as needed for dizziness.     . metoprolol tartrate (LOPRESSOR) 25 MG tablet Take 1 tablet (25 mg total) by mouth every morning. 90 tablet 3  . nitroGLYCERIN (NITROSTAT) 0.4 MG SL tablet PLACE 1 TABLET (0.4 MG TOTAL) UNDER THE TONGUE EVERY 5 (FIVE) MINUTES AS NEEDED. (Patient taking differently: Place 0.4 mg under the tongue every  5 (five) minutes as needed for chest pain. ) 25 tablet 3  . pantoprazole (PROTONIX) 40 MG tablet Take 1 tablet (40 mg total) by mouth daily. 30 tablet 0  . polyethylene glycol (MIRALAX / GLYCOLAX) packet Take 17 g by mouth 3 (three) times a week.     . potassium chloride SA (KLOR-CON M15) 15 MEQ tablet Take 2 tablets (30 mEq total) by mouth daily. 180 tablet 3  . tamsulosin (FLOMAX) 0.4 MG CAPS capsule Take 1 capsule (0.4 mg total) by mouth 2 (two) times daily. 60 capsule 0  . thiamine 100 MG tablet Take 1 tablet (100 mg total) by mouth daily. 30 tablet 0  .  traZODone (DESYREL) 100 MG tablet TAKE 1 TABLET (100 MG TOTAL) BY MOUTH AT BEDTIME AS NEEDED FOR SLEEP. 90 tablet 1   No current facility-administered medications for this visit.   Allergies:  Penicillins   Social History: The patient  reports that he quit smoking about 20 years ago. His smoking use included cigarettes. He started smoking about 52 years ago. He has a 35.00 pack-year smoking history. He has never used smokeless tobacco. He reports current alcohol use of about 42.0 standard drinks of alcohol per week. He reports current drug use. Drug: Marijuana.   Family History: The patient's family history includes Healthy in his son; Heart attack in his brother and father; Other in his mother; Stroke in his mother.   ROS:  Please see the history of present illness. Otherwise, complete review of systems is positive for none.  All other systems are reviewed and negative.   Physical Exam: VS:  There were no vitals taken for this visit., BMI There is no height or weight on file to calculate BMI.  Wt Readings from Last 3 Encounters:  10/12/19 251 lb 9.6 oz (114.1 kg)  10/27/19 252 lb 6.8 oz (114.5 kg)  09/21/19 247 lb (112 kg)    General: Patient appears comfortable at rest. Neck: Supple, no elevated JVP or carotid bruits, no thyromegaly. Lungs: Clear to auscultation, nonlabored breathing at rest. Cardiac: Irregularly irregular rate and  rhythm, no S3 or significant systolic murmur, no pericardial rub. Abdomen: Soft, nontender, no hepatomegaly, bowel sounds present, no guarding or rebound. Extremities: Mild non pitting edema, distal pulses 2+. Skin: Warm and dry. Musculoskeletal: No kyphosis. Neuropsychiatric: Alert and oriented x3, affect grossly appropriate.  ECG:    Recent Labwork: 03/12/2019: TSH 10.690 03/17/2019: Magnesium 1.8 03/28/2019: ALT 37; AST 19 04/06/2019: B Natriuretic Peptide 196.9 04/10/2019: BUN 14; Creatinine, Ser 0.79; Hemoglobin 10.7; Platelets 177; Potassium 3.6; Sodium 141     Component Value Date/Time   CHOL 170 03/07/2019 2358   TRIG 218 (H) 03/07/2019 2358   HDL 41 03/07/2019 2358   CHOLHDL 4.1 03/07/2019 2358   VLDL 44 (H) 03/07/2019 2358   [place] 85 03/07/2019 2358    Other Studies Reviewed Today:  CABG 03/10/2019 Procedure (s):  1. Coronary artery bypass grafting x3 (left internal mammary artery to left anterior descending, saphenous vein graft to ramus intermedius, saphenous vein graft to posterior descending). 2. Endoscopic harvest of right leg greater saphenous vein by Dr. Prescott Gum on 03/10/2019.   Echo 03/08/19: IMPRESSIONS  1. Left ventricular ejection fraction, by visual estimation, is 55 to 60%. The left ventricle has normal function. Normal left ventricular size. There is mildly increased left ventricular hypertrophy. Basal inferior akinesis. 2. Left ventricular diastolic Doppler parameters are consistent with impaired relaxation pattern of LV diastolic filling. 3. Global right ventricle has normal systolic function.The right ventricular size is normal. No increase in right ventricular wall thickness. 4. The aortic valve is tricuspid Aortic valve regurgitation is trivial by color flow Doppler. Mild aortic valve stenosis. Mean gradient 10 mmHg. 5. There is mild dilatation of the ascending aorta measuring 42 mm. 6. Left atrial size was moderately dilated. 7. Right  atrial size was mildly dilated. 8. The tricuspid valve is normal in structure. Tricuspid valve regurgitation was not visualized by color flow Doppler. 9. The mitral valve is normal in structure. No evidence of mitral valve regurgitation. No evidence of mitral stenosis. 10. TR signal is inadequate for assessing pulmonary artery systolic pressure. 11. The inferior vena cava  is normal in size with greater than 50% respiratory variability, suggesting right atrial pressure of 3 mmHg.  Carotid Dopplers 03/09/2019:1 to 39% ICA stenosis bilaterally.  Left heart cath 03/08/2019:  Severe three-vessel coronary calcification  Patent left main  Eccentric ostial 50 to 75% LAD with mid vessel 40 to 50% narrowing. Mid to distal LAD stent is widely patent.  Ramus intermedius contains proximal 90% stenosis.  Circumflex contains 99% proximal/ostial stenosis.  RCA is heavily calcified and stented. The artery is totally occluded in the proximal to mid segment. Left-to-right collaterals are noted.  Mildly reduced LV systolic function with EF estimated to be 40%. LVEDP is 28 mmHg. Findings compatible with acute on chronic combined    Assessment and Plan:  1. Persistent atrial fibrillation ([place])   2. Coronary artery disease of bypass graft of native heart with stable angina pectoris ([place])   3. Essential hypertension   4. Hyperlipidemia LDL goal <70   5. Left-sided carotid artery disease, unspecified type ([place])    1. Persistent atrial fibrillation ([facility]) Continues in atrial fibrillation with irregularly irregular rate of 52.  Continue amiodarone 200 mg daily.  Continue Eliquis 5 mg p.o. twice daily.  2. Coronary artery disease of bypass graft of native heart with stable angina pectoris [facility]) Denies any progressive anginal or exertional symptoms.  Continue aspirin 81 mg.,  Nitroglycerin sublingual 0.4 mg as needed for chest pain.  3. Essential hypertension Blood pressure remains elevated on  Norvasc 5 mg daily.  Patient also states he has some lower extremity edema.  This could be associated with Norvasc.  Add lisinopril 10 mg to daily regimen.  Get BMP and magnesium in 2 weeks.  Record your blood pressures daily when starting the new medication and call us in 2 weeks with blood pressure results after starting the medication.  We may need to adjust the medication.  4. Hyperlipidemia LDL goal <70 Last lipid profile showed triglycerides 218, HDL 41, LDL 85.  LDL not at goal.  Continue high-dose atorvastatin 80 mg a day.  5. Left-sided carotid artery disease, unspecified type ([place]) Carotid artery duplex study 03/28/2019 showed bilateral 1 to 39% ICA disease.   Medication Adjustments/Labs and Tests Ordered: Current medicines are reviewed at length with the patient today.  Concerns regarding medicines are outlined above.   Disposition: Follow-up with Dr. Bronson Ing or APP 3 months Signed, Levell July, NP 10/29/2019 6:23 PM    [facility] [place], [place], [place] [address] Phone: [phone]; Fax: [phone]

## 2019-10-30 ENCOUNTER — Ambulatory Visit: Payer: Medicare Other | Admitting: Family Medicine

## 2019-10-30 ENCOUNTER — Other Ambulatory Visit: Payer: Self-pay

## 2019-10-30 ENCOUNTER — Encounter: Payer: Self-pay | Admitting: Family Medicine

## 2019-10-30 VITALS — BP 148/70 | HR 52 | Ht 68.0 in | Wt 250.8 lb

## 2019-10-30 DIAGNOSIS — I10 Essential (primary) hypertension: Secondary | ICD-10-CM | POA: Diagnosis not present

## 2019-10-30 DIAGNOSIS — I779 Disorder of arteries and arterioles, unspecified: Secondary | ICD-10-CM | POA: Diagnosis not present

## 2019-10-30 DIAGNOSIS — I4819 Other persistent atrial fibrillation: Secondary | ICD-10-CM

## 2019-10-30 DIAGNOSIS — I25708 Atherosclerosis of coronary artery bypass graft(s), unspecified, with other forms of angina pectoris: Secondary | ICD-10-CM

## 2019-10-30 DIAGNOSIS — E785 Hyperlipidemia, unspecified: Secondary | ICD-10-CM | POA: Diagnosis not present

## 2019-10-30 MED ORDER — LISINOPRIL 10 MG PO TABS: 10.0000 mg | ORAL_TABLET | Freq: Every day | ORAL | 3 refills | Status: DC

## 2019-10-30 NOTE — Patient Instructions (Addendum)
Medication Instructions:    Your physician has recommended you make the following change in your medication:  Start lisinopril 10 mg by mouth daily  Continue other medications the same  Labwork:  Your physician recommends that you return for non-fasting lab work in: 2 weeks to check your BMET & Mg levels. This may be done at [facility] or [facility] or [facility] Monday-Friday from 8:00 am - 4:00 pm. No appointment is needed.  Testing/Procedures:  NONE  Follow-Up:  Your physician recommends that you schedule a follow-up appointment in: 3 months (office).  Any Other Special Instructions Will Be Listed Below (If Applicable). Your physician has requested that you regularly monitor and record your blood pressure readings at home daily, preferably 2 hours after your blood pressure medications. Please use the same machine at the same time of day to check your readings and record them and send them to [place] through mychart.   If you need a refill on your cardiac medications before your next appointment, please call your pharmacy.

## 2019-11-05 ENCOUNTER — Other Ambulatory Visit: Payer: Self-pay | Admitting: Physical Medicine and Rehabilitation

## 2019-11-08 DIAGNOSIS — R5381 Other malaise: Secondary | ICD-10-CM | POA: Diagnosis not present

## 2019-11-10 DIAGNOSIS — Z7409 Other reduced mobility: Secondary | ICD-10-CM | POA: Diagnosis not present

## 2019-11-10 DIAGNOSIS — I63331 Cerebral infarction due to thrombosis of right posterior cerebral artery: Secondary | ICD-10-CM | POA: Diagnosis not present

## 2019-11-10 DIAGNOSIS — Z951 Presence of aortocoronary bypass graft: Secondary | ICD-10-CM | POA: Diagnosis not present

## 2019-11-10 DIAGNOSIS — I69354 Hemiplegia and hemiparesis following cerebral infarction affecting left non-dominant side: Secondary | ICD-10-CM | POA: Diagnosis not present

## 2019-11-13 DIAGNOSIS — I4891 Unspecified atrial fibrillation: Secondary | ICD-10-CM | POA: Diagnosis not present

## 2019-11-13 DIAGNOSIS — I10 Essential (primary) hypertension: Secondary | ICD-10-CM | POA: Diagnosis not present

## 2019-11-13 DIAGNOSIS — R600 Localized edema: Secondary | ICD-10-CM | POA: Diagnosis not present

## 2019-11-13 DIAGNOSIS — R001 Bradycardia, unspecified: Secondary | ICD-10-CM | POA: Diagnosis not present

## 2019-11-14 ENCOUNTER — Other Ambulatory Visit: Payer: Self-pay | Admitting: Family Medicine

## 2019-11-14 DIAGNOSIS — I4819 Other persistent atrial fibrillation: Secondary | ICD-10-CM | POA: Diagnosis not present

## 2019-11-14 DIAGNOSIS — I10 Essential (primary) hypertension: Secondary | ICD-10-CM | POA: Diagnosis not present

## 2019-11-15 LAB — BASIC METABOLIC PANEL WITH GFR
BUN: 20 mg/dL (ref 7–25)
CO2: 28 mmol/L (ref 20–32)
Calcium: 8.9 mg/dL (ref 8.6–10.3)
Chloride: 106 mmol/L (ref 98–110)
Creat: 0.94 mg/dL (ref 0.70–1.25)
GFR, Est African American: 98 mL/min/{1.73_m2} (ref >=60)
GFR, Est Non African American: 84 mL/min/{1.73_m2} (ref >=60)
Glucose, Bld: 141 mg/dL — ABNORMAL HIGH (ref 65–139)
Potassium: 4 mmol/L (ref 3.5–5.3)
Sodium: 143 mmol/L (ref 135–146)

## 2019-11-15 LAB — MAGNESIUM: Magnesium: 2 mg/dL (ref 1.5–2.5)

## 2019-11-20 ENCOUNTER — Telehealth: Payer: Self-pay | Admitting: *Deleted

## 2019-11-20 NOTE — Telephone Encounter (Signed)
Patient informed. Copy sent to PCP °

## 2019-11-20 NOTE — Telephone Encounter (Signed)
-----   Message from Martin [name]., NP sent at 11/16/2019 12:49 PM EDT ----- Call the patient and tell him all of his lab work looked good except for his glucose which was 141.

## 2019-11-28 DIAGNOSIS — H47011 Ischemic optic neuropathy, right eye: Secondary | ICD-10-CM | POA: Diagnosis not present

## 2019-12-08 DIAGNOSIS — R5381 Other malaise: Secondary | ICD-10-CM | POA: Diagnosis not present

## 2019-12-10 DIAGNOSIS — Z951 Presence of aortocoronary bypass graft: Secondary | ICD-10-CM | POA: Diagnosis not present

## 2019-12-10 DIAGNOSIS — I63331 Cerebral infarction due to thrombosis of right posterior cerebral artery: Secondary | ICD-10-CM | POA: Diagnosis not present

## 2019-12-10 DIAGNOSIS — I69354 Hemiplegia and hemiparesis following cerebral infarction affecting left non-dominant side: Secondary | ICD-10-CM | POA: Diagnosis not present

## 2019-12-10 DIAGNOSIS — Z7409 Other reduced mobility: Secondary | ICD-10-CM | POA: Diagnosis not present

## 2019-12-21 DIAGNOSIS — I10 Essential (primary) hypertension: Secondary | ICD-10-CM | POA: Diagnosis not present

## 2019-12-21 DIAGNOSIS — Z0000 Encounter for general adult medical examination without abnormal findings: Secondary | ICD-10-CM | POA: Diagnosis not present

## 2019-12-21 DIAGNOSIS — M1990 Unspecified osteoarthritis, unspecified site: Secondary | ICD-10-CM | POA: Diagnosis not present

## 2019-12-21 DIAGNOSIS — Z712 Person consulting for explanation of examination or test findings: Secondary | ICD-10-CM | POA: Diagnosis not present

## 2019-12-21 DIAGNOSIS — I69354 Hemiplegia and hemiparesis following cerebral infarction affecting left non-dominant side: Secondary | ICD-10-CM | POA: Diagnosis not present

## 2019-12-21 DIAGNOSIS — I4891 Unspecified atrial fibrillation: Secondary | ICD-10-CM | POA: Diagnosis not present

## 2019-12-26 DIAGNOSIS — G4733 Obstructive sleep apnea (adult) (pediatric): Secondary | ICD-10-CM | POA: Diagnosis not present

## 2020-01-04 DIAGNOSIS — I4891 Unspecified atrial fibrillation: Secondary | ICD-10-CM | POA: Diagnosis not present

## 2020-01-04 DIAGNOSIS — I10 Essential (primary) hypertension: Secondary | ICD-10-CM | POA: Diagnosis not present

## 2020-01-04 DIAGNOSIS — R7301 Impaired fasting glucose: Secondary | ICD-10-CM | POA: Diagnosis not present

## 2020-01-04 DIAGNOSIS — M1990 Unspecified osteoarthritis, unspecified site: Secondary | ICD-10-CM | POA: Diagnosis not present

## 2020-01-04 DIAGNOSIS — Z0001 Encounter for general adult medical examination with abnormal findings: Secondary | ICD-10-CM | POA: Diagnosis not present

## 2020-01-08 DIAGNOSIS — I69354 Hemiplegia and hemiparesis following cerebral infarction affecting left non-dominant side: Secondary | ICD-10-CM | POA: Diagnosis not present

## 2020-01-08 DIAGNOSIS — R5381 Other malaise: Secondary | ICD-10-CM | POA: Diagnosis not present

## 2020-01-08 DIAGNOSIS — M1990 Unspecified osteoarthritis, unspecified site: Secondary | ICD-10-CM | POA: Diagnosis not present

## 2020-01-08 DIAGNOSIS — I10 Essential (primary) hypertension: Secondary | ICD-10-CM | POA: Diagnosis not present

## 2020-01-08 DIAGNOSIS — R7301 Impaired fasting glucose: Secondary | ICD-10-CM | POA: Diagnosis not present

## 2020-01-08 DIAGNOSIS — I4891 Unspecified atrial fibrillation: Secondary | ICD-10-CM | POA: Diagnosis not present

## 2020-01-08 DIAGNOSIS — D509 Iron deficiency anemia, unspecified: Secondary | ICD-10-CM | POA: Diagnosis not present

## 2020-01-10 DIAGNOSIS — Z951 Presence of aortocoronary bypass graft: Secondary | ICD-10-CM | POA: Diagnosis not present

## 2020-01-10 DIAGNOSIS — I69354 Hemiplegia and hemiparesis following cerebral infarction affecting left non-dominant side: Secondary | ICD-10-CM | POA: Diagnosis not present

## 2020-01-10 DIAGNOSIS — I63331 Cerebral infarction due to thrombosis of right posterior cerebral artery: Secondary | ICD-10-CM | POA: Diagnosis not present

## 2020-01-10 DIAGNOSIS — Z7409 Other reduced mobility: Secondary | ICD-10-CM | POA: Diagnosis not present

## 2020-01-25 ENCOUNTER — Encounter (INDEPENDENT_AMBULATORY_CARE_PROVIDER_SITE_OTHER): Payer: Self-pay | Admitting: Gastroenterology

## 2020-01-25 ENCOUNTER — Encounter (INDEPENDENT_AMBULATORY_CARE_PROVIDER_SITE_OTHER): Payer: Self-pay | Admitting: *Deleted

## 2020-01-25 ENCOUNTER — Ambulatory Visit (INDEPENDENT_AMBULATORY_CARE_PROVIDER_SITE_OTHER): Payer: Medicare Other | Admitting: Gastroenterology

## 2020-01-25 ENCOUNTER — Telehealth (INDEPENDENT_AMBULATORY_CARE_PROVIDER_SITE_OTHER): Payer: Self-pay | Admitting: *Deleted

## 2020-01-25 ENCOUNTER — Other Ambulatory Visit: Payer: Self-pay

## 2020-01-25 ENCOUNTER — Other Ambulatory Visit (INDEPENDENT_AMBULATORY_CARE_PROVIDER_SITE_OTHER): Payer: Self-pay | Admitting: Gastroenterology

## 2020-01-25 ENCOUNTER — Telehealth: Payer: Self-pay | Admitting: *Deleted

## 2020-01-25 DIAGNOSIS — D649 Anemia, unspecified: Secondary | ICD-10-CM | POA: Insufficient documentation

## 2020-01-25 DIAGNOSIS — R195 Other fecal abnormalities: Secondary | ICD-10-CM | POA: Insufficient documentation

## 2020-01-25 DIAGNOSIS — D509 Iron deficiency anemia, unspecified: Secondary | ICD-10-CM | POA: Insufficient documentation

## 2020-01-25 MED ORDER — PLENVU 140 G PO SOLR: 1.0000 | Freq: Once | ORAL | 0 refills | Status: DC

## 2020-01-25 NOTE — Telephone Encounter (Signed)
   [facility] Pre-operative Risk Assessment    HEARTCARE STAFF: - Please ensure there is not already an duplicate clearance open for this procedure. - Under Visit Info/Reason for Call, type in Other and utilize the format Clearance MM/DD/YY or Clearance TBD. Do not use dashes or single digits. - If request is for dental extraction, please clarify the # of teeth to be extracted.  Request for surgical clearance:  1. What type of surgery is being performed? COLONOSCOPY/ENDOSCOPY   2. When is this surgery scheduled? 03/01/20   3. What type of clearance is required (medical clearance vs. Pharmacy clearance to hold med vs. Both)? BOTH  4. Are there any medications that need to be held prior to surgery and how long? ELIQUIS x 2 DAYS PRIOR TO PROCEDURE   5. Practice name and name of physician performing surgery? [facility]; DR. Jenetta Downer   6. What is the office phone number? [phone]   7.   What is the office fax number? [phone]  8.   Anesthesia type (None, local, MAC, general) ? MAC (PROPOFOL)   Julaine Hua 01/25/2020, 12:50 PM  _________________________________________________________________   (provider comments below)

## 2020-01-25 NOTE — Patient Instructions (Signed)
Schedule EGD and colonoscopy by mid to late September - need to obtain cardiology clearance due to bradycardia and will need to obtain clearance from cardiologist to stop 2 days before the procedure Perform blood workup

## 2020-01-25 NOTE — Telephone Encounter (Signed)
Patient needs Plenvu (copay card) ° °

## 2020-01-25 NOTE — Progress Notes (Addendum)
Martin [name], M.D. Gastroenterology & Hepatology [facility] [address] [place], [place] [address] Primary Care Physician: [name], MD [place] [address]  Referring MD: PCP  I will communicate my assessment and recommendations to the referring MD via EMR. Note: Occasional unusual wording and randomly placed punctuation marks may result from the use of speech recognition technology to transcribe this document"  Chief Complaint: Anemia and positive FOBT  History of Present Illness: Martin [name] is a 64 y.o. male with past medical history of atrial fibrillation on Eliquis, coronary artery disease status post PCI with stent placement in 2011, carotid artery disease, hyperlipidemia, gout,, hypertension, OSA, h/o stroke ,  who presents for evaluation of anemia and positive FOBT.  Patient states that he has been asymptomatic.  He reports that he was found to have persistent anemia by his PCP.  He underwent fecal occult blood test which was positive.  He denies having any melena, hematochezia or hematemesis.  Denies having any complaint so far. The patient denies having any nausea, vomiting, fever, chills, abdominal distention, abdominal pain, diarrhea, jaundice, pruritus or weight loss.  The patient states that he was taking diclofenac half a tablet of 75 mg twice a day for many years intermittently but he was told by his primary care to stop it 3 weeks ago as it could be causing some of his anemia.  He is taking Eliquis 5 mg twice daily.  Patient brings lab results today. I personally reviewed and interpreted the available lab results which showed: 12/21/2019 CBC with anemia hemoglobin 10.4, MCV 86, white blood cell count 4.6, platelet count 154, normal, CMP with very mildly elevated ALT of 36 but normal AST 28, total bilirubin 0.6, albumin 4.1 and alkaline phosphatase 107.  Normal electrolytes and renal function.  Upon review of his medical record, the patient was noted to have mild anemia since October 2020, his hemoglobin has associated between 10.3 and 12.  Last EGD: Never Last Colonoscopy: 2019 - normal per report in Care Everywhere  FHx: neg for any gastrointestinal/liver disease, no malignancies, multiple members with heart disease Social: former smoking quit 20 years ago, neg alcohol or illicit drug use Surgical: none in abdomen  Past Medical History: Past Medical History:  Diagnosis Date  . Atrial fibrillation ([place])    a. dx 10/2017. b. s/p DCCV on 11/19/2017 with successful conversion to NSR but back in AFIB 3 weeks later  . CAD (coronary artery disease)    a. s/p PCI to LAD 2001. b. s/p stenting to the RCA and mid LAD December 2011, residual distal RCA disease treated medically. b. nuc 02/2013 abnormal with fixed defect seen in inferoapical, mid-inferior, and basal inferior walls, indicative of myocardial scar, no evidence of ischemia, EF 41% (Ef 55-60% by echo same time).  . Carotid artery disease ([place])    carotid Doppler course left side 5069% stenosis  . Dysrhythmia    AFib  . Habitual alcohol use   . Hyperlipidemia   . Hypertension   . Low back pain   . OSA on CPAP   . Osteoarthritis   . Polysubstance abuse ([place])    use including marijuana and vicodin,which he obtained from the street  . PVC's (premature ventricular contractions)    status post Holter monitor normal sinus rhythm otherwise asymptomatic PVCs.  . Seasonal allergies     Past Surgical History: Past Surgical History:  Procedure Laterality Date  . CARDIOVERSION  N/A 11/19/2017   Procedure: CARDIOVERSION;  Surgeon: Herminio Commons, MD;  [facility];  Service: Cardiovascular;  Laterality: N/A;  . CORONARY ANGIOPLASTY WITH STENT PLACEMENT  2011  . CORONARY ARTERY BYPASS GRAFT N/A 03/10/2019   Procedure: CORONARY ARTERY BYPASS GRAFTING (CABG)X 3 Using Left Internal Mammary Artery and Right Saphenous Vein  Grafts;  Surgeon: Ivin Poot, MD;  Location: [facility];  Service: Open Heart Surgery;  Laterality: N/A;  . LEFT HEART CATH AND CORONARY ANGIOGRAPHY N/A 03/08/2019   Procedure: LEFT HEART CATH AND CORONARY ANGIOGRAPHY;  Surgeon: Belva Crome, MD;  Location: [facility];  Service: Cardiovascular;  Laterality: N/A;  . TEE WITHOUT CARDIOVERSION N/A 03/10/2019   Procedure: TRANSESOPHAGEAL ECHOCARDIOGRAM (TEE);  Surgeon: Prescott Gum, Collier Salina, MD;  Location: [facility];  Service: Open Heart Surgery;  Laterality: N/A;  . TONSILLECTOMY      Family History: Family History  Problem Relation Age of Onset  . Stroke Mother   . Other Mother        small bowel obstruction  . Heart attack Father   . Heart attack Brother   . Healthy Son     Social History: Social History   Tobacco Use  Smoking Status Former Smoker  . Packs/day: 1.00  . Years: 35.00  . Pack years: 35.00  . Types: Cigarettes  . Start date: 09/18/1967  . Quit date: 06/09/1999  . Years since quitting: 20.6  Smokeless Tobacco Never Used   Social History   Substance and Sexual Activity  Alcohol Use Yes  . Alcohol/week: 42.0 standard drinks  . Types: 42 Shots of liquor per week   Comment: 1/2 gallon og liquor per week   Social History   Substance and Sexual Activity  Drug Use Yes  . Types: Marijuana   Comment: smokes marijuana occassionally-last smoked few weeks ago    Allergies: Allergies  Allergen Reactions  . Penicillins Hives and Rash    Has patient had a PCN reaction causing immediate rash, facial/tongue/throat swelling, SOB or lightheadedness with hypotension: Unknown Has patient had a PCN reaction causing severe rash involving mucus membranes or skin necrosis: Unknown Has patient had a PCN reaction that required hospitalization: No Has patient had a PCN reaction occurring within the last 10 years: No If all of the above answers are "NO", then may proceed with Cephalosporin use.      Medications: Current  Outpatient Medications  Medication Sig Dispense Refill  . acetaminophen (TYLENOL) 325 MG tablet Take 1-2 tablets (325-650 mg total) by mouth every 4 (four) hours as needed for mild pain.    Marland Kitchen allopurinol (ZYLOPRIM) 300 MG tablet Take 300 mg by mouth every morning.     Marland Kitchen amiodarone (PACERONE) 200 MG tablet Take 1 tablet (200 mg total) by mouth daily. 90 tablet 3  . amLODipine (NORVASC) 5 MG tablet Take 1 tablet (5 mg total) by mouth daily. 90 tablet 3  . aspirin EC 81 MG EC tablet Take 1 tablet (81 mg total) by mouth daily. 100 tablet 1  . atorvastatin (LIPITOR) 80 MG tablet TAKE 1 TABLET BY MOUTH EVERY DAY 90 tablet 1  . Cholecalciferol (VITAMIN D-3) 1000 UNITS CAPS Take 1,000 Units by mouth every morning.     . citalopram (CELEXA) 10 MG tablet TAKE 1 TABLET BY MOUTH EVERY DAY 90 tablet 1  . colchicine 0.6 MG tablet Take 1 tablet (0.6 mg total) by mouth daily at 6 PM. 30 tablet 1  . Cyanocobalamin (VITAMIN  B-12) 2500 MCG SUBL Place 2,500 mcg under the tongue every morning.     . diclofenac sodium (VOLTAREN) 1 % GEL Apply 2 g topically 4 (four) times daily. 350 g 1  . docusate sodium (COLACE) 100 MG capsule Take 2 capsules (200 mg total) by mouth daily. 60 capsule 0  . ELIQUIS 5 MG TABS tablet TAKE 1 TABLET BY MOUTH TWICE A DAY 180 tablet 2  . fluticasone (FLONASE) 50 MCG/ACT nasal spray Place 2 sprays into the nose daily.      . folic acid (FOLVITE) 1 MG tablet TAKE ONE TABLET BY MOUTH EVERY DAY (Patient taking differently: Take 1 mg by mouth every morning. ) 30 tablet 6  . furosemide (LASIX) 20 MG tablet Take 1 tablet (20 mg total) by mouth 2 (two) times daily. 180 tablet 3  . lisinopril (ZESTRIL) 10 MG tablet Take 1 tablet (10 mg total) by mouth daily. 90 tablet 3  . loratadine (CLARITIN) 10 MG tablet Take 10 mg by mouth every morning.     Marland Kitchen LORazepam (ATIVAN) 0.5 MG tablet Take 0.5 mg by mouth 2 (two) times daily as needed.    . meclizine (ANTIVERT) 32 MG tablet Take 32 mg by mouth daily as  needed for dizziness.     . metoprolol tartrate (LOPRESSOR) 25 MG tablet Take 1 tablet (25 mg total) by mouth every morning. 90 tablet 3  . nitroGLYCERIN (NITROSTAT) 0.4 MG SL tablet PLACE 1 TABLET (0.4 MG TOTAL) UNDER THE TONGUE EVERY 5 (FIVE) MINUTES AS NEEDED. (Patient taking differently: Place 0.4 mg under the tongue every 5 (five) minutes as needed for chest pain. ) 25 tablet 3  . pantoprazole (PROTONIX) 40 MG tablet Take 1 tablet (40 mg total) by mouth daily. 30 tablet 0  . polyethylene glycol (MIRALAX / GLYCOLAX) packet Take 17 g by mouth 3 (three) times a week.     . potassium chloride SA (KLOR-CON M15) 15 MEQ tablet Take 2 tablets (30 mEq total) by mouth daily. 180 tablet 3  . tamsulosin (FLOMAX) 0.4 MG CAPS capsule Take 1 capsule (0.4 mg total) by mouth 2 (two) times daily. 60 capsule 0  . thiamine 100 MG tablet Take 1 tablet (100 mg total) by mouth daily. 30 tablet 0  . traZODone (DESYREL) 100 MG tablet TAKE 1 TABLET (100 MG TOTAL) BY MOUTH AT BEDTIME AS NEEDED FOR SLEEP. 90 tablet 1   No current facility-administered medications for this visit.    Review of Systems: GENERAL: negative for malaise, night sweats HEENT: No changes in hearing or vision, no nose bleeds or other nasal problems. NECK: Negative for lumps, goiter, pain and significant neck swelling RESPIRATORY: Negative for cough, wheezing CARDIOVASCULAR: Negative for chest pain, leg swelling, palpitations, orthopnea GI: SEE HPI MUSCULOSKELETAL: Negative for joint pain or swelling, back pain, and muscle pain. SKIN: Negative for lesions, rash PSYCH: Negative for sleep disturbance, mood disorder and recent psychosocial stressors. HEMATOLOGY Negative for prolonged bleeding, bruising easily, and swollen nodes. ENDOCRINE: Negative for cold or heat intolerance, polyuria, polydipsia and goiter. NEURO: negative for tremor, gait imbalance, syncope and seizures. The remainder of the review of systems is  noncontributory.   Physical Exam: BP 132/71 (BP Location: Right Arm, Patient Position: Sitting, Cuff Size: Large)   Pulse (!) 44   Temp 98.5 F (36.9 C) (Oral)   Ht 5\' 8"  (1.727 m)   Wt 248 lb 8 oz (112.7 kg)   BMI 37.78 kg/m  GENERAL: The patient is AO x3, in  no acute distress. Obese. HEENT: Head is normocephalic and atraumatic. EOMI are intact. Mouth is well hydrated and without lesions. NECK: Supple. No masses LUNGS: Clear to auscultation. No presence of rhonchi/wheezing/rales. Adequate chest expansion HEART: bradycardic and arrhytmic, normal s1 and s2. ABDOMEN: Soft, nontender, no guarding, no peritoneal signs, and nondistended. BS +. Has a ventral hernia but no masses. EXTREMITIES: Without any cyanosis, clubbing, rash, lesions or edema. NEUROLOGIC: AOx3, no focal motor deficit. SKIN: no jaundice, no rashes  Imaging/Labs: as above  I personally reviewed and interpreted the available labs, imaging and endoscopic files.  Impression and Plan: EFE FAZZINO is a 64 y.o. male with past medical history of atrial fibrillation on Eliquis, coronary artery disease status post PCI with stent placement in 2011, carotid artery disease, hyperlipidemia, gout,, hypertension, OSA, h/o stroke ,  who presents for evaluation of anemia and positive FOBT. Patient has presented mild anemia for the last 8 months, however, this has not been characterized.  It would be important to obtain iron studies to determine if there is any iron deficiency anemia, but this seems less likely as he has normocytic anemia.  However, the patient had positive FOBT which will require to be further explored with an EGD with small bowel biopsies and colonoscopy.  Nevertheless, the patient was noted to be persistently bradycardic during his visit.  He will need to get clearance from cardiology (next appointment in 02/05/2020) to undergo the endoscopic procedure as he will receive propofol sedation for it.  The patient  understood and agreed.  Also, will ask cardiology their opinion about holding Eliquis 48 hours before his procedure.  The patient understood and agreed.  - Schedule EGD and colonoscopy by mid to late September - need to obtain cardiology clearance due to bradycardia and will need to obtain clearance from cardiologist to stop 2 days before the procedure -If negative investigations, will need to proceed with capsule endoscopy - Check iron stores   All questions were answered.      Martin Peppers, MD Gastroenterology and Hepatology [facility]

## 2020-01-26 DIAGNOSIS — D649 Anemia, unspecified: Secondary | ICD-10-CM | POA: Diagnosis not present

## 2020-01-27 LAB — IRON,TIBC AND FERRITIN PANEL
%SAT: 7 % (calc) — ABNORMAL LOW (ref 20–48)
Ferritin: 10 ng/mL — ABNORMAL LOW (ref 24–380)
Iron: 32 ug/dL — ABNORMAL LOW (ref 50–180)
TIBC: 450 mcg/dL (calc) — ABNORMAL HIGH (ref 250–425)

## 2020-01-28 NOTE — Telephone Encounter (Signed)
Clinical pharmacist to review Eliquis 

## 2020-01-29 ENCOUNTER — Telehealth (INDEPENDENT_AMBULATORY_CARE_PROVIDER_SITE_OTHER): Payer: Self-pay | Admitting: *Deleted

## 2020-01-29 NOTE — Telephone Encounter (Signed)
Cardiologist recommendation for blood thinner: you requested patient hold for 2 days - do I need to call patient and let him know 1 day instead of 2   Due to the patient's history of stroke OK to hold Eliquis one day pre op- resume as soon as safe post op

## 2020-01-29 NOTE — Telephone Encounter (Signed)
Patient with diagnosis of afib on Eliquis for anticoagulation.    Procedure:  COLONOSCOPY/ENDOSCOPY  Date of procedure: 03/01/20  CHADS2-VASc score of  5 (HTN, AGE, stroke/tia x 2, CAD)  CrCl 94 ml/min  Due to patients history of a stroke, would recommend patient hold Eliquis only one day prior to procedure.

## 2020-01-29 NOTE — Telephone Encounter (Signed)
   Primary Cardiologist: Kate Sable, MD (Inactive)  Chart reviewed and patient contacted by phone todayas part of pre-operative protocol coverage. Given past medical history and time since last visit, based on ACC/AHA guidelines, Martin [name] would be at acceptable risk for the planned procedure without further cardiovascular testing.   Due to the patient's history of stroke OK to hold Eliquis one day pre op- resume as soon as safe post op.  I will route this recommendation to the requesting party via Epic fax function and remove from pre-op pool.  Please call with questions.  Kerin Ransom, PA-C 01/29/2020, 9:54 AM

## 2020-01-29 NOTE — Telephone Encounter (Signed)
Left detailed message for patient to stop blood thinner only 1 day piror to procedure instead of 2

## 2020-01-29 NOTE — Telephone Encounter (Signed)
Hi Ann,   Yes please, let him know he needs to skip both doses of the day before the procedure.  Thanks,  Maylon Peppers, MD Gastroenterology and Hepatology [facility]

## 2020-02-01 DIAGNOSIS — Z0001 Encounter for general adult medical examination with abnormal findings: Secondary | ICD-10-CM | POA: Diagnosis not present

## 2020-02-01 DIAGNOSIS — M1990 Unspecified osteoarthritis, unspecified site: Secondary | ICD-10-CM | POA: Diagnosis not present

## 2020-02-01 DIAGNOSIS — I69354 Hemiplegia and hemiparesis following cerebral infarction affecting left non-dominant side: Secondary | ICD-10-CM | POA: Diagnosis not present

## 2020-02-01 DIAGNOSIS — I4891 Unspecified atrial fibrillation: Secondary | ICD-10-CM | POA: Diagnosis not present

## 2020-02-01 DIAGNOSIS — I10 Essential (primary) hypertension: Secondary | ICD-10-CM | POA: Diagnosis not present

## 2020-02-01 DIAGNOSIS — R7301 Impaired fasting glucose: Secondary | ICD-10-CM | POA: Diagnosis not present

## 2020-02-05 ENCOUNTER — Ambulatory Visit: Payer: Medicare Other | Admitting: Cardiovascular Disease

## 2020-02-05 ENCOUNTER — Encounter: Payer: Self-pay | Admitting: Family Medicine

## 2020-02-05 ENCOUNTER — Ambulatory Visit: Payer: Medicare Other | Admitting: Family Medicine

## 2020-02-05 VITALS — BP 140/78 | HR 52 | Ht 68.0 in | Wt 252.4 lb

## 2020-02-05 DIAGNOSIS — I10 Essential (primary) hypertension: Secondary | ICD-10-CM | POA: Diagnosis not present

## 2020-02-05 DIAGNOSIS — E782 Mixed hyperlipidemia: Secondary | ICD-10-CM

## 2020-02-05 DIAGNOSIS — I4819 Other persistent atrial fibrillation: Secondary | ICD-10-CM | POA: Diagnosis not present

## 2020-02-05 DIAGNOSIS — I779 Disorder of arteries and arterioles, unspecified: Secondary | ICD-10-CM | POA: Diagnosis not present

## 2020-02-05 DIAGNOSIS — I2510 Atherosclerotic heart disease of native coronary artery without angina pectoris: Secondary | ICD-10-CM

## 2020-02-05 NOTE — Patient Instructions (Signed)
Medication Instructions:  Continue all current medications.   Labwork: none  Testing/Procedures: none  Follow-Up: 6 months   Any Other Special Instructions Will Be Listed Below (If Applicable).   If you need a refill on your cardiac medications before your next appointment, please call your pharmacy.  

## 2020-02-05 NOTE — Progress Notes (Signed)
Cardiology Office Note  Date: 02/05/2020   ID: Martin, [name] 1955-07-08, MRN [id number]  PCP:  [name], MD  Cardiologist:  No primary care provider on file. Electrophysiologist:  [name], MD   Chief Complaint: F/U persistent atrial fibrillation, CAD, HTN, HLD, Carotid artery disease  History of Present Illness: Martin [name] is a 64 y.o. male with a history of persistent atrial fibrillation, CAD (CABG 00/9381 complicated by CVA), HTN, HLD, Carotid artery disease.  Lat encounter with Dr Lovena Le 07/28/2019 via telehealth. He had been participating in rehab. He was bothered by high cost of Eliquis. Stated his BP usually up in the am but normal in afternoon. His HR was well controlled. Had no anginal symptoms or bleeding on Eliquis.   At last visit patient stated he had been doing well generally speaking without any progressive anginal or exertional symptoms.  Stated the only issues he had was with balance/dizziness since he had a stroke in October of last year.  He denied any orthostatic or presyncopal/syncopal episodes.  Stated his blood pressure had been averaging in the 829 systolic.  He has a phone app which he uses for daily blood pressures and he records them.  He denied any palpitations or arrhythmias, PND or orthopnea, claudication-like issues, DVT or PE-like state issues.  He did have some minor nonpitting lower extremity edema.  He has sleep apnea and iwascompliant with his CPAP.  He stated he did well in cardiac rehab and is continuing some rehab for his stroke.  He is here for 16-month follow-up today.  He states from a physical and cardiac standpoint he has been doing well.  He denies any recent symptoms of palpitations or arrhythmias or rapid heart rates.  Heart rate is well controlled with a pulse of 52 today.  Denies any anginal or exertional symptoms.  States his blood pressure is well controlled at home.  States this morning his systolic blood pressure was  118.  He states he has been rushing around today and the elevated blood pressure today may be a reflection of that.  He denies any PND, orthopnea, CVA or TIA-like symptoms, claudication-like symptoms, DVT or PE-like symptoms, or lower extremity edema.  He states his hemoglobin has been a little low and he is scheduled to undergo a colonoscopy in mid September to see if there may be some bleeding.  He denies any blood in stool or urine.  He states recently he has been significantly stressed due to taking care of his wife of 41 years who has ALS and seems to be deteriorating significantly recently.  He is the primary caregiver.  He states he would like to go back to work but cannot leave her in her current state.   Past Medical History:  Diagnosis Date  . Atrial fibrillation ([place])    a. dx 10/2017. b. s/p DCCV on 11/19/2017 with successful conversion to NSR but back in AFIB 3 weeks later  . CAD (coronary artery disease)    a. s/p PCI to LAD 2001. b. s/p stenting to the RCA and mid LAD December 2011, residual distal RCA disease treated medically. b. nuc 02/2013 abnormal with fixed defect seen in inferoapical, mid-inferior, and basal inferior walls, indicative of myocardial scar, no evidence of ischemia, EF 41% (Ef 55-60% by echo same time).  . Carotid artery disease ([place])    carotid Doppler course left side 5069% stenosis  . Dysrhythmia    AFib  . Habitual alcohol use   .  Hyperlipidemia   . Hypertension   . Low back pain   . OSA on CPAP   . Osteoarthritis   . Polysubstance abuse ([place])    use including marijuana and vicodin,which he obtained from the street  . PVC's (premature ventricular contractions)    status post Holter monitor normal sinus rhythm otherwise asymptomatic PVCs.  . Seasonal allergies     Past Surgical History:  Procedure Laterality Date  . CARDIOVERSION N/A 11/19/2017   Procedure: CARDIOVERSION;  Surgeon: Herminio Commons, MD;  [facility];  Service:  Cardiovascular;  Laterality: N/A;  . CORONARY ANGIOPLASTY WITH STENT PLACEMENT  2011  . CORONARY ARTERY BYPASS GRAFT N/A 03/10/2019   Procedure: CORONARY ARTERY BYPASS GRAFTING (CABG)X 3 Using Left Internal Mammary Artery and Right Saphenous Vein Grafts;  Surgeon: Ivin Poot, MD;  Location: [facility];  Service: Open Heart Surgery;  Laterality: N/A;  . LEFT HEART CATH AND CORONARY ANGIOGRAPHY N/A 03/08/2019   Procedure: LEFT HEART CATH AND CORONARY ANGIOGRAPHY;  Surgeon: Belva Crome, MD;  Location: [place] [facility];  Service: Cardiovascular;  Laterality: N/A;  . TEE WITHOUT CARDIOVERSION N/A 03/10/2019   Procedure: TRANSESOPHAGEAL ECHOCARDIOGRAM (TEE);  Surgeon: Prescott Gum, Collier Salina, MD;  Location: [facility];  Service: Open Heart Surgery;  Laterality: N/A;  . TONSILLECTOMY      Current Outpatient Medications  Medication Sig Dispense Refill  . acetaminophen (TYLENOL) 325 MG tablet Take 1-2 tablets (325-650 mg total) by mouth every 4 (four) hours as needed for mild pain.    Marland Kitchen allopurinol (ZYLOPRIM) 300 MG tablet Take 300 mg by mouth every morning.     Marland Kitchen amiodarone (PACERONE) 200 MG tablet Take 1 tablet (200 mg total) by mouth daily. 90 tablet 3  . amLODipine (NORVASC) 5 MG tablet Take 1 tablet (5 mg total) by mouth daily. 90 tablet 3  . aspirin EC 81 MG EC tablet Take 1 tablet (81 mg total) by mouth daily. 100 tablet 1  . atorvastatin (LIPITOR) 80 MG tablet TAKE 1 TABLET BY MOUTH EVERY DAY 90 tablet 1  . Cholecalciferol (VITAMIN D-3) 1000 UNITS CAPS Take 1,000 Units by mouth every morning.     . citalopram (CELEXA) 10 MG tablet TAKE 1 TABLET BY MOUTH EVERY DAY 90 tablet 1  . colchicine 0.6 MG tablet Take 1 tablet (0.6 mg total) by mouth daily at 6 PM. 30 tablet 1  . Cyanocobalamin (VITAMIN B-12) 2500 MCG SUBL Place 2,500 mcg under the tongue every morning.     . docusate sodium (COLACE) 100 MG capsule Take 2 capsules (200 mg total) by mouth daily. 60 capsule 0  . ELIQUIS 5 MG TABS tablet TAKE 1  TABLET BY MOUTH TWICE A DAY 180 tablet 2  . fluticasone (FLONASE) 50 MCG/ACT nasal spray Place 2 sprays into the nose daily.      . folic acid (FOLVITE) 1 MG tablet TAKE ONE TABLET BY MOUTH EVERY DAY (Patient taking differently: Take 1 mg by mouth every morning. ) 30 tablet 6  . furosemide (LASIX) 20 MG tablet Take 1 tablet (20 mg total) by mouth 2 (two) times daily. 180 tablet 3  . HYDROcodone-acetaminophen (NORCO/VICODIN) 5-325 MG tablet Take 1 tablet by mouth every 8 (eight) hours as needed.    Marland Kitchen lisinopril (ZESTRIL) 10 MG tablet Take 1 tablet (10 mg total) by mouth daily. 90 tablet 3  . loratadine (CLARITIN) 10 MG tablet Take 10 mg by mouth every morning.     Marland Kitchen LORazepam (  ATIVAN) 0.5 MG tablet Take 0.5 mg by mouth 2 (two) times daily as needed.    . meclizine (ANTIVERT) 32 MG tablet Take 32 mg by mouth daily as needed for dizziness.     . metoprolol tartrate (LOPRESSOR) 25 MG tablet Take 1 tablet (25 mg total) by mouth every morning. 90 tablet 3  . nitroGLYCERIN (NITROSTAT) 0.4 MG SL tablet PLACE 1 TABLET (0.4 MG TOTAL) UNDER THE TONGUE EVERY 5 (FIVE) MINUTES AS NEEDED. (Patient taking differently: Place 0.4 mg under the tongue every 5 (five) minutes as needed for chest pain. ) 25 tablet 3  . pantoprazole (PROTONIX) 40 MG tablet Take 1 tablet (40 mg total) by mouth daily. 30 tablet 0  . PLENVU 140 g SOLR USE AS DIRECTED 1 each 0  . polyethylene glycol (MIRALAX / GLYCOLAX) packet Take 17 g by mouth 3 (three) times a week.     . potassium chloride SA (KLOR-CON M15) 15 MEQ tablet Take 2 tablets (30 mEq total) by mouth daily. 180 tablet 3  . tamsulosin (FLOMAX) 0.4 MG CAPS capsule Take 1 capsule (0.4 mg total) by mouth 2 (two) times daily. 60 capsule 0  . thiamine 100 MG tablet Take 1 tablet (100 mg total) by mouth daily. 30 tablet 0  . traZODone (DESYREL) 100 MG tablet TAKE 1 TABLET (100 MG TOTAL) BY MOUTH AT BEDTIME AS NEEDED FOR SLEEP. 90 tablet 1   No current facility-administered medications  for this visit.   Allergies:  Penicillins   Social History: The patient  reports that he quit smoking about 20 years ago. His smoking use included cigarettes. He started smoking about 52 years ago. He has a 35.00 pack-year smoking history. He has never used smokeless tobacco. He reports current alcohol use of about 42.0 standard drinks of alcohol per week. He reports current drug use. Drug: Marijuana.   Family History: The patient's family history includes Healthy in his son; Heart attack in his brother and father; Other in his mother; Stroke in his mother.   ROS:  Please see the history of present illness. Otherwise, complete review of systems is positive for none.  All other systems are reviewed and negative.   Physical Exam: VS:  BP 140/78   Pulse (!) 52   Ht 5\' 8"  (1.727 m)   Wt 252 lb 6.4 oz (114.5 kg)   SpO2 95%   BMI 38.38 kg/m , BMI Body mass index is 38.38 kg/m.  Wt Readings from Last 3 Encounters:  02/05/20 252 lb 6.4 oz (114.5 kg)  01/25/20 248 lb 8 oz (112.7 kg)  10/30/19 250 lb 12.8 oz (113.8 kg)    General: Patient appears comfortable at rest. Neck: Supple, no elevated JVP or carotid bruits, no thyromegaly. Lungs: Clear to auscultation, nonlabored breathing at rest. Cardiac: Irregularly irregular rate and rhythm, no S3 or significant systolic murmur, no pericardial rub. Abdomen: Soft, nontender, no hepatomegaly, bowel sounds present, no guarding or rebound. Extremities: Mild non pitting edema, distal pulses 2+. Skin: Warm and dry. Musculoskeletal: No kyphosis. Neuropsychiatric: Alert and oriented x3, affect grossly appropriate.  ECG:    Recent Labwork: 03/12/2019: TSH 10.690 03/28/2019: ALT 37; AST 19 04/06/2019: B Natriuretic Peptide 196.9 04/10/2019: Hemoglobin 10.7; Platelets 177 11/14/2019: BUN 20; Creat 0.94; Magnesium 2.0; Potassium 4.0; Sodium 143     Component Value Date/Time   CHOL 170 03/07/2019 2358   TRIG 218 (H) 03/07/2019 2358   HDL 41 03/07/2019  2358   CHOLHDL 4.1 03/07/2019 2358  VLDL 44 (H) 03/07/2019 2358   [place] 85 03/07/2019 2358    Other Studies Reviewed Today:  CABG 03/10/2019 Procedure (s):  1. Coronary artery bypass grafting x3 (left internal mammary artery to left anterior descending, saphenous vein graft to ramus intermedius, saphenous vein graft to posterior descending). 2. Endoscopic harvest of right leg greater saphenous vein by Dr. Prescott Gum on 03/10/2019.   Echo 03/08/19: IMPRESSIONS  1. Left ventricular ejection fraction, by visual estimation, is 55 to 60%. The left ventricle has normal function. Normal left ventricular size. There is mildly increased left ventricular hypertrophy. Basal inferior akinesis. 2. Left ventricular diastolic Doppler parameters are consistent with impaired relaxation pattern of LV diastolic filling. 3. Global right ventricle has normal systolic function.The right ventricular size is normal. No increase in right ventricular wall thickness. 4. The aortic valve is tricuspid Aortic valve regurgitation is trivial by color flow Doppler. Mild aortic valve stenosis. Mean gradient 10 mmHg. 5. There is mild dilatation of the ascending aorta measuring 42 mm. 6. Left atrial size was moderately dilated. 7. Right atrial size was mildly dilated. 8. The tricuspid valve is normal in structure. Tricuspid valve regurgitation was not visualized by color flow Doppler. 9. The mitral valve is normal in structure. No evidence of mitral valve regurgitation. No evidence of mitral stenosis. 10. TR signal is inadequate for assessing pulmonary artery systolic pressure. 11. The inferior vena cava is normal in size with greater than 50% respiratory variability, suggesting right atrial pressure of 3 mmHg.  Carotid Dopplers 03/09/2019:1 to 39% ICA stenosis bilaterally.  Left heart cath 03/08/2019:  Severe three-vessel coronary calcification  Patent left main  Eccentric ostial 50 to 75% LAD with  mid vessel 40 to 50% narrowing. Mid to distal LAD stent is widely patent.  Ramus intermedius contains proximal 90% stenosis.  Circumflex contains 99% proximal/ostial stenosis.  RCA is heavily calcified and stented. The artery is totally occluded in the proximal to mid segment. Left-to-right collaterals are noted.  Mildly reduced LV systolic function with EF estimated to be 40%. LVEDP is 28 mmHg. Findings compatible with acute on chronic combined    Assessment and Plan:   1. Persistent atrial fibrillation (HCC) Continues in atrial fibrillation with irregularly irregular rate of 52.  Continue amiodarone 200 mg daily.  Continue Eliquis 5 mg p.o. twice daily.  2. Coronary artery disease of bypass graft of native heart with stable angina pectoris [facility]) Denies any progressive anginal or exertional symptoms.  Continue aspirin 81 mg.,  Nitroglycerin sublingual 0.4 mg as needed for chest pain.  3. Essential hypertension Blood pressure is elevated today on arrival at 140/78.  Patient states his systolic blood pressure this morning when he checked it was 118.  He states his systolic pressures at home usual  [place]aly run in the 1 18-1 30 range.  Has some mild lower extremity edema and is wearing compression stockings today.  This could be associated with Norvasc.  At last visit lisinopril 10 mg was added to daily regimen.     4. Hyperlipidemia LDL goal <70 Last lipid profile showed triglycerides 218, HDL 41, LDL 85.  LDL not at goal.  Continue high-dose atorvastatin 80 mg a day.  5. Left-sided carotid artery disease, unspecified type ([place]) Carotid artery duplex study 03/28/2019 showed bilateral 1 to 39% ICA disease.   Medication Adjustments/Labs and Tests Ordered: Current medicines are reviewed at length with the patient today.  Concerns regarding medicines are outlined above.   Disposition: Follow-up with Dr. Domenic Polite or  [facility] 6 months Signed, Martin July, NP 02/05/2020 2:27 PM    [facility] [place], [place], [place] [address] Phone: [phone]; Fax: [phone]

## 2020-02-07 DIAGNOSIS — M1990 Unspecified osteoarthritis, unspecified site: Secondary | ICD-10-CM | POA: Diagnosis not present

## 2020-02-07 DIAGNOSIS — R7301 Impaired fasting glucose: Secondary | ICD-10-CM | POA: Diagnosis not present

## 2020-02-07 DIAGNOSIS — I10 Essential (primary) hypertension: Secondary | ICD-10-CM | POA: Diagnosis not present

## 2020-02-07 DIAGNOSIS — D509 Iron deficiency anemia, unspecified: Secondary | ICD-10-CM | POA: Diagnosis not present

## 2020-02-07 DIAGNOSIS — I4891 Unspecified atrial fibrillation: Secondary | ICD-10-CM | POA: Diagnosis not present

## 2020-02-08 ENCOUNTER — Other Ambulatory Visit: Payer: Self-pay | Admitting: *Deleted

## 2020-02-08 MED ORDER — ATORVASTATIN CALCIUM 80 MG PO TABS
80.0000 mg | ORAL_TABLET | Freq: Every day | ORAL | 1 refills | Status: DC
Start: 2020-02-08 — End: 2020-05-17

## 2020-02-10 DIAGNOSIS — I63331 Cerebral infarction due to thrombosis of right posterior cerebral artery: Secondary | ICD-10-CM | POA: Diagnosis not present

## 2020-02-10 DIAGNOSIS — Z951 Presence of aortocoronary bypass graft: Secondary | ICD-10-CM | POA: Diagnosis not present

## 2020-02-10 DIAGNOSIS — I69354 Hemiplegia and hemiparesis following cerebral infarction affecting left non-dominant side: Secondary | ICD-10-CM | POA: Diagnosis not present

## 2020-02-10 DIAGNOSIS — Z7409 Other reduced mobility: Secondary | ICD-10-CM | POA: Diagnosis not present

## 2020-02-20 ENCOUNTER — Other Ambulatory Visit (INDEPENDENT_AMBULATORY_CARE_PROVIDER_SITE_OTHER): Payer: Self-pay | Admitting: *Deleted

## 2020-02-26 NOTE — Patient Instructions (Signed)
Martin [name]  02/26/2020     @PREFPERIOPPHARMACY @   Your procedure is scheduled on  03/01/2020.  Report to [facility] at  [phone]  A.M.  Call this number if you have problems the morning of surgery:  [phone]   Remember:  Follow the diet and prep instructions given to you by the office.                       Take these medicines the morning of surgery with A SIP OF WATER  Allopurinol, amiodarone, amllodipine, celexa, hydrocodone(if needed), antivert(if needed), protonix. You should not tale any eliquis on 9/23 or 9/24- before your procedure.    Do not wear jewelry, make-up or nail polish.  Do not wear lotions, powders, or perfumes. Please wear deodorant and brush your teeth.  Do not shave 48 hours prior to surgery.  Men may shave face and neck.  Do not bring valuables to the hospital.  [facility] is not responsible for any belongings or valuables.  Contacts, dentures or bridgework may not be worn into surgery.  Leave your suitcase in the car.  After surgery it may be brought to your room.  For patients admitted to the hospital, discharge time will be determined by your treatment team.  Patients discharged the day of surgery will not be allowed to drive home.   Name and phone number of your driver:  family Special instructions:  DO NOT smoke the morning of your procedure.  Please read over the following fact sheets that you were given. Anesthesia Post-op Instructions and Care and Recovery After Surgery       Upper Endoscopy, Adult, Care After This sheet gives you information about how to care for yourself after your procedure. Your health care provider may also give you more specific instructions. If you have problems or questions, contact your health care provider. What can I expect after the procedure? After the procedure, it is common to have:  A sore throat.  Mild stomach pain or discomfort.  Bloating.  Nausea. Follow these instructions at  home:   Follow instructions from your health care provider about what to eat or drink after your procedure.  Return to your normal activities as told by your health care provider. Ask your health care provider what activities are safe for you.  Take over-the-counter and prescription medicines only as told by your health care provider.  Do not drive for 24 hours if you were given a sedative during your procedure.  Keep all follow-up visits as told by your health care provider. This is important. Contact a health care provider if you have:  A sore throat that lasts longer than one day.  Trouble swallowing. Get help right away if:  You vomit blood or your vomit looks like coffee grounds.  You have: ? A fever. ? Bloody, black, or tarry stools. ? A severe sore throat or you cannot swallow. ? Difficulty breathing. ? Severe pain in your chest or abdomen. Summary  After the procedure, it is common to have a sore throat, mild stomach discomfort, bloating, and nausea.  Do not drive for 24 hours if you were given a sedative during the procedure.  Follow instructions from your health care provider about what to eat or drink after your procedure.  Return to your normal activities as told by your health care provider. This information is not intended to replace advice given to you  by your health care provider. Make sure you discuss any questions you have with your health care provider. Document Revised: 11/16/2017 Document Reviewed: 10/25/2017 Elsevier Patient Education  [place].  Colonoscopy, Adult, Care After This sheet gives you information about how to care for yourself after your procedure. Your health care provider may also give you more specific instructions. If you have problems or questions, contact your health care provider. What can I expect after the procedure? After the procedure, it is common to have:  A small amount of blood in your stool for 24 hours after  the procedure.  Some gas.  Mild cramping or bloating of your abdomen. Follow these instructions at home: Eating and drinking   Drink enough fluid to keep your urine pale yellow.  Follow instructions from your health care provider about eating or drinking restrictions.  Resume your normal diet as instructed by your health care provider. Avoid heavy or fried foods that are hard to digest. Activity  Rest as told by your health care provider.  Avoid sitting for a long time without moving. Get up to take short walks every 1-2 hours. This is important to improve blood flow and breathing. Ask for help if you feel weak or unsteady.  Return to your normal activities as told by your health care provider. Ask your health care provider what activities are safe for you. Managing cramping and bloating   Try walking around when you have cramps or feel bloated.  Apply heat to your abdomen as told by your health care provider. Use the heat source that your health care provider recommends, such as a moist heat pack or a heating pad. ? Place a towel between your skin and the heat source. ? Leave the heat on for 20-30 minutes. ? Remove the heat if your skin turns bright red. This is especially important if you are unable to feel pain, heat, or cold. You may have a greater risk of getting burned. General instructions  For the first 24 hours after the procedure: ? Do not drive or use machinery. ? Do not sign important documents. ? Do not drink alcohol. ? Do your regular daily activities at a slower pace than normal. ? Eat soft foods that are easy to digest.  Take over-the-counter and prescription medicines only as told by your health care provider.  Keep all follow-up visits as told by your health care provider. This is important. Contact a health care provider if:  You have blood in your stool 2-3 days after the procedure. Get help right away if you have:  More than a small spotting of  blood in your stool.  Large blood clots in your stool.  Swelling of your abdomen.  Nausea or vomiting.  A fever.  Increasing pain in your abdomen that is not relieved with medicine. Summary  After the procedure, it is common to have a small amount of blood in your stool. You may also have mild cramping and bloating of your abdomen.  For the first 24 hours after the procedure, do not drive or use machinery, sign important documents, or drink alcohol.  Get help right away if you have a lot of blood in your stool, nausea or vomiting, a fever, or increased pain in your abdomen. This information is not intended to replace advice given to you by your health care provider. Make sure you discuss any questions you have with your health care provider. Document Revised: 12/19/2018 Document Reviewed: 12/19/2018 Elsevier Patient Education  [place] After These instructions provide you with information about caring for yourself after your procedure. Your health care provider may also give you more specific instructions. Your treatment has been planned according to current medical practices, but problems sometimes occur. Call your health care provider if you have any problems or questions after your procedure. What can I expect after the procedure? After your procedure, you may:  Feel sleepy for several hours.  Feel clumsy and have poor balance for several hours.  Feel forgetful about what happened after the procedure.  Have poor judgment for several hours.  Feel nauseous or vomit.  Have a sore throat if you had a breathing tube during the procedure. Follow these instructions at home: For at least 24 hours after the procedure:      Have a responsible adult stay with you. It is important to have someone help care for you until you are awake and alert.  Rest as needed.  Do not: ? Participate in activities in which you could fall or become  injured. ? Drive. ? Use heavy machinery. ? Drink alcohol. ? Take sleeping pills or medicines that cause drowsiness. ? Make important decisions or sign legal documents. ? Take care of children on your own. Eating and drinking  Follow the diet that is recommended by your health care provider.  If you vomit, drink water, juice, or soup when you can drink without vomiting.  Make sure you have little or no nausea before eating solid foods. General instructions  Take over-the-counter and prescription medicines only as told by your health care provider.  If you have sleep apnea, surgery and certain medicines can increase your risk for breathing problems. Follow instructions from your health care provider about wearing your sleep device: ? Anytime you are sleeping, including during daytime naps. ? While taking prescription pain medicines, sleeping medicines, or medicines that make you drowsy.  If you smoke, do not smoke without supervision.  Keep all follow-up visits as told by your health care provider. This is important. Contact a health care provider if:  You keep feeling nauseous or you keep vomiting.  You feel light-headed.  You develop a rash.  You have a fever. Get help right away if:  You have trouble breathing. Summary  For several hours after your procedure, you may feel sleepy and have poor judgment.  Have a responsible adult stay with you for at least 24 hours or until you are awake and alert. This information is not intended to replace advice given to you by your health care provider. Make sure you discuss any questions you have with your health care provider. Document Revised: 08/23/2017 Document Reviewed: 09/15/2015 Elsevier Patient Education  [place].

## 2020-02-27 ENCOUNTER — Encounter: Payer: Self-pay | Admitting: Internal Medicine

## 2020-02-27 ENCOUNTER — Other Ambulatory Visit: Payer: Self-pay

## 2020-02-27 ENCOUNTER — Ambulatory Visit: Payer: Medicare Other | Admitting: Internal Medicine

## 2020-02-27 VITALS — BP 122/60 | HR 45 | Ht 68.0 in | Wt 248.0 lb

## 2020-02-27 DIAGNOSIS — I4819 Other persistent atrial fibrillation: Secondary | ICD-10-CM | POA: Diagnosis not present

## 2020-02-27 NOTE — Progress Notes (Signed)
HPI [name] returns today for followup. He is a pleasant 64 yo man with a h/o persistent atrial fib who was placed on amiodarone. He has CAD, s/p CABG, and obesity. His HR have been well controlled. He has not had syncope or near syncope.  Allergies  Allergen Reactions  . Penicillins Hives and Rash    Has patient had a PCN reaction causing immediate rash, facial/tongue/throat swelling, SOB or lightheadedness with hypotension: Unknown Has patient had a PCN reaction causing severe rash involving mucus membranes or skin necrosis: Unknown Has patient had a PCN reaction that required hospitalization: No Has patient had a PCN reaction occurring within the last 10 years: No If all of the above answers are "NO", then may proceed with Cephalosporin use.       Current Outpatient Medications  Medication Sig Dispense Refill  . acetaminophen (TYLENOL) 325 MG tablet Take 1-2 tablets (325-650 mg total) by mouth every 4 (four) hours as needed for mild pain.    Marland Kitchen allopurinol (ZYLOPRIM) 300 MG tablet Take 300 mg by mouth daily.     Marland Kitchen amiodarone (PACERONE) 200 MG tablet Take 1 tablet (200 mg total) by mouth daily. 90 tablet 3  . amLODipine (NORVASC) 5 MG tablet Take 1 tablet (5 mg total) by mouth daily. 90 tablet 3  . aspirin EC 81 MG EC tablet Take 1 tablet (81 mg total) by mouth daily. 100 tablet 1  . atorvastatin (LIPITOR) 80 MG tablet Take 1 tablet (80 mg total) by mouth daily. (Patient taking differently: Take 80 mg by mouth at bedtime. ) 90 tablet 1  . Cholecalciferol (VITAMIN D-3) 1000 UNITS CAPS Take 1,000 Units by mouth every morning.     . citalopram (CELEXA) 10 MG tablet TAKE 1 TABLET BY MOUTH EVERY DAY (Patient taking differently: Take 10 mg by mouth daily. ) 90 tablet 1  . colchicine 0.6 MG tablet Take 1 tablet (0.6 mg total) by mouth daily at 6 PM. 30 tablet 1  . Cyanocobalamin (VITAMIN B-12) 2500 MCG SUBL Place 2,500 mcg under the tongue every morning.     . docusate sodium  (COLACE) 100 MG capsule Take 2 capsules (200 mg total) by mouth daily. (Patient taking differently: Take 100 mg by mouth 2 (two) times daily. ) 60 capsule 0  . ELIQUIS 5 MG TABS tablet TAKE 1 TABLET BY MOUTH TWICE A DAY (Patient taking differently: Take 5 mg by mouth 2 (two) times daily. ) 180 tablet 2  . fluticasone (FLONASE) 50 MCG/ACT nasal spray Place 2 sprays into the nose daily.      . folic acid (FOLVITE) 1 MG tablet TAKE ONE TABLET BY MOUTH EVERY DAY (Patient taking differently: Take 1 mg by mouth daily. ) 30 tablet 6  . furosemide (LASIX) 20 MG tablet Take 1 tablet (20 mg total) by mouth 2 (two) times daily. 180 tablet 3  . HYDROcodone-acetaminophen (NORCO/VICODIN) 5-325 MG tablet Take 1 tablet by mouth every 8 (eight) hours as needed.    Marland Kitchen lisinopril (ZESTRIL) 10 MG tablet Take 1 tablet (10 mg total) by mouth daily. 90 tablet 3  . loratadine (CLARITIN) 10 MG tablet Take 10 mg by mouth daily.     Marland Kitchen LORazepam (ATIVAN) 0.5 MG tablet Take 0.5 mg by mouth at bedtime.     . meclizine (ANTIVERT) 32 MG tablet Take 32 mg by mouth daily as needed for dizziness.     . metoprolol tartrate (LOPRESSOR) 25 MG tablet Take 1 tablet (  25 mg total) by mouth every morning. (Patient taking differently: Take 25 mg by mouth daily. ) 90 tablet 3  . nitroGLYCERIN (NITROSTAT) 0.4 MG SL tablet PLACE 1 TABLET (0.4 MG TOTAL) UNDER THE TONGUE EVERY 5 (FIVE) MINUTES AS NEEDED. (Patient taking differently: Place 0.4 mg under the tongue every 5 (five) minutes as needed for chest pain. ) 25 tablet 3  . pantoprazole (PROTONIX) 40 MG tablet Take 1 tablet (40 mg total) by mouth daily. (Patient taking differently: Take 40 mg by mouth at bedtime. ) 30 tablet 0  . polyethylene glycol (MIRALAX / GLYCOLAX) packet Take 17 g by mouth at bedtime as needed for mild constipation.     . potassium chloride SA (KLOR-CON M15) 15 MEQ tablet Take 2 tablets (30 mEq total) by mouth daily. (Patient taking differently: Take 15 mEq by mouth 2 (two)  times daily. ) 180 tablet 3  . tamsulosin (FLOMAX) 0.4 MG CAPS capsule Take 1 capsule (0.4 mg total) by mouth 2 (two) times daily. 60 capsule 0  . thiamine 100 MG tablet Take 1 tablet (100 mg total) by mouth daily. 30 tablet 0  . traZODone (DESYREL) 100 MG tablet TAKE 1 TABLET (100 MG TOTAL) BY MOUTH AT BEDTIME AS NEEDED FOR SLEEP. 90 tablet 1   No current facility-administered medications for this visit.     Past Medical History:  Diagnosis Date  . Atrial fibrillation ([place])    a. dx 10/2017. b. s/p DCCV on 11/19/2017 with successful conversion to NSR but back in AFIB 3 weeks later  . CAD (coronary artery disease)    a. s/p PCI to LAD 2001. b. s/p stenting to the RCA and mid LAD December 2011, residual distal RCA disease treated medically. b. nuc 02/2013 abnormal with fixed defect seen in inferoapical, mid-inferior, and basal inferior walls, indicative of myocardial scar, no evidence of ischemia, EF 41% (Ef 55-60% by echo same time).  . Carotid artery disease ([place])    carotid Doppler course left side 5069% stenosis  . Dysrhythmia    AFib  . Habitual alcohol use   . Hyperlipidemia   . Hypertension   . Low back pain   . OSA on CPAP   . Osteoarthritis   . Polysubstance abuse ([place])    use including marijuana and vicodin,which he obtained from the street  . PVC's (premature ventricular contractions)    status post Holter monitor normal sinus rhythm otherwise asymptomatic PVCs.  . Seasonal allergies     ROS:   All systems reviewed and negative except as noted in the HPI.   Past Surgical History:  Procedure Laterality Date  . CARDIOVERSION N/A 11/19/2017   Procedure: CARDIOVERSION;  Surgeon: Herminio Commons, MD;  [facility];  Service: Cardiovascular;  Laterality: N/A;  . CORONARY ANGIOPLASTY WITH STENT PLACEMENT  2011  . CORONARY ARTERY BYPASS GRAFT N/A 03/10/2019   Procedure: CORONARY ARTERY BYPASS GRAFTING (CABG)X 3 Using Left Internal Mammary Artery and Right  Saphenous Vein Grafts;  Surgeon: Ivin Poot, MD;  Location: [facility];  Service: Open Heart Surgery;  Laterality: N/A;  . LEFT HEART CATH AND CORONARY ANGIOGRAPHY N/A 03/08/2019   Procedure: LEFT HEART CATH AND CORONARY ANGIOGRAPHY;  Surgeon: Belva Crome, MD;  Location: [facility];  Service: Cardiovascular;  Laterality: N/A;  . TEE WITHOUT CARDIOVERSION N/A 03/10/2019   Procedure: TRANSESOPHAGEAL ECHOCARDIOGRAM (TEE);  Surgeon: Prescott Gum, Collier Salina, MD;  Location: [facility];  Service: Open Heart Surgery;  Laterality: N/A;  .  TONSILLECTOMY       Family History  Problem Relation Age of Onset  . Stroke Mother   . Other Mother        small bowel obstruction  . Heart attack Father   . Heart attack Brother   . Healthy Son      Social History   Socioeconomic History  . Marital status: Married    Spouse name: Not on file  . Number of children: 1  . Years of education: Not on file  . Highest education level: 9th grade  Occupational History    Employer: UNEMPLOYED    Comment: works Social worker employed  Tobacco Use  . Smoking status: Former Smoker    Packs/day: 1.00    Years: 35.00    Pack years: 35.00    Types: Cigarettes    Start date: 09/18/1967    Quit date: 06/09/1999    Years since quitting: 20.7  . Smokeless tobacco: Never Used  Vaping Use  . Vaping Use: Never used  Substance and Sexual Activity  . Alcohol use: Yes    Alcohol/week: 42.0 standard drinks    Types: 42 Shots of liquor per week    Comment: 1/2 gallon og liquor per week  . Drug use: Yes    Types: Marijuana    Comment: smokes marijuana occassionally-last smoked few weeks ago  . Sexual activity: Yes    Birth control/protection: None  Other Topics Concern  . Not on file  Social History Narrative   No exercise   Pt lives with spouse in 1 story home   Right handed   Drinks coffee every morning, not tea, no soda   One son high level of education is 9th grade   Social Determinants of Health    Financial Resource Strain:   . Difficulty of Paying Living Expenses: Not on file  Food Insecurity:   . Worried About Charity fundraiser in the Last Year: Not on file  . Ran Out of Food in the Last Year: Not on file  Transportation Needs:   . Lack of Transportation (Medical): Not on file  . Lack of Transportation (Non-Medical): Not on file  Physical Activity:   . Days of Exercise per Week: Not on file  . Minutes of Exercise per Session: Not on file  Stress:   . Feeling of Stress : Not on file  Social Connections:   . Frequency of Communication with Friends and Family: Not on file  . Frequency of Social Gatherings with Friends and Family: Not on file  . Attends Religious Services: Not on file  . Active Member of Clubs or Organizations: Not on file  . Attends Archivist Meetings: Not on file  . Marital Status: Not on file  Intimate Partner Violence:   . Fear of Current or Ex-Partner: Not on file  . Emotionally Abused: Not on file  . Physically Abused: Not on file  . Sexually Abused: Not on file     BP 122/60   Pulse (!) 45   Ht 5\' 8"  (1.727 m)   Wt 248 lb (112.5 kg)   BMI 37.71 kg/m   Physical Exam:  Well appearing NAD HEENT: Unremarkable Neck:  No JVD, no thyromegally Lymphatics:  No adenopathy Back:  No CVA tenderness Lungs:  Clear with no wheezes HEART:  Regular rate rhythm, no murmurs, no rubs, no clicks Abd:  soft, positive bowel sounds, no organomegally, no rebound, no guarding Ext:  2 plus pulses, no edema,  no cyanosis, no clubbing Skin:  No rashes no nodules Neuro:  CN II through XII intact, motor grossly intact  EKG - atrial fib with a slow VR  Assess/Plan: 1. Atrial fib - his VR is slow. I have asked him stop his amiodarone. His HR is slow and should improve with stopping of his amiodarone. 2. CAD - he denies anginal symptoms. No change in his meds. 3. HTN - his bp is well controlled.  4. Dyslipidemia - he will continue his atorvastatin.    Carleene Overlie Katilin Raynes,MD

## 2020-02-27 NOTE — Patient Instructions (Signed)
Medication Instructions:  Your physician has recommended you make the following change in your medication:   Stop Taking Amiodarone   May stop taking Eliquis 2 days prior to Colonoscopy and restart when ok with GI Doctor.   *If you need a refill on your cardiac medications before your next appointment, please call your pharmacy*   Lab Work: NONE   If you have labs (blood work) drawn today and your tests are completely normal, you will receive your results only by:  [facility] (if you have MyChart) OR  A paper copy in the mail If you have any lab test that is abnormal or we need to change your treatment, we will call you to review the results.   Testing/Procedures: NONE    Follow-Up: At [facility], you and your health needs are our priority.  As part of our continuing mission to provide you with exceptional heart care, we have created designated Provider Care Teams.  These Care Teams include your primary Cardiologist (physician) and Advanced Practice Providers (APPs -  Physician Assistants and Nurse Practitioners) who all work together to provide you with the care you need, when you need it.  We recommend signing up for the patient portal called "MyChart".  Sign up information is provided on this After Visit Summary.  MyChart is used to connect with patients for Virtual Visits (Telemedicine).  Patients are able to view lab/test results, encounter notes, upcoming appointments, etc.  Non-urgent messages can be sent to your provider as well.   To learn more about what you can do with MyChart, go to [email or web address].    Your next appointment:   1 year(s)  The format for your next appointment:   In Person  Provider:   [name], MD   Other Instructions Thank you for choosing [facility]!

## 2020-02-28 ENCOUNTER — Other Ambulatory Visit (HOSPITAL_COMMUNITY)
Admission: RE | Admit: 2020-02-28 | Discharge: 2020-02-28 | Disposition: A | Discharge status: Home / Self Care | Payer: Medicare Other | Source: Ambulatory Visit | Attending: Gastroenterology | Admitting: Gastroenterology

## 2020-02-28 ENCOUNTER — Encounter (HOSPITAL_COMMUNITY): Payer: Self-pay

## 2020-02-28 ENCOUNTER — Other Ambulatory Visit: Payer: Self-pay

## 2020-02-28 ENCOUNTER — Encounter (HOSPITAL_COMMUNITY)
Admission: RE | Admit: 2020-02-28 | Discharge: 2020-02-28 | Disposition: A | Discharge status: Home / Self Care | Payer: Medicare Other | Source: Ambulatory Visit | Attending: Gastroenterology | Admitting: Gastroenterology

## 2020-02-28 ENCOUNTER — Telehealth (INDEPENDENT_AMBULATORY_CARE_PROVIDER_SITE_OTHER): Payer: Self-pay | Admitting: *Deleted

## 2020-02-28 DIAGNOSIS — Z20822 Contact with and (suspected) exposure to covid-19: Secondary | ICD-10-CM | POA: Diagnosis not present

## 2020-02-28 DIAGNOSIS — Z01818 Encounter for other preprocedural examination: Secondary | ICD-10-CM | POA: Insufficient documentation

## 2020-02-28 HISTORY — DX: Gastro-esophageal reflux disease without esophagitis: K21.9

## 2020-02-28 LAB — BASIC METABOLIC PANEL
Anion gap: 8 (ref 5–15)
BUN: 18 mg/dL (ref 8–23)
CO2: 24 mmol/L (ref 22–32)
Calcium: 8.7 mg/dL — ABNORMAL LOW (ref 8.9–10.3)
Chloride: 106 mmol/L (ref 98–111)
Creatinine, Ser: 0.88 mg/dL (ref 0.61–1.24)
GFR calc Af Amer: >60 mL/min (ref >60)
GFR calc non Af Amer: >60 mL/min (ref >60)
Glucose, Bld: 141 mg/dL — ABNORMAL HIGH (ref 70–99)
Potassium: 3.9 mmol/L (ref 3.5–5.1)
Sodium: 138 mmol/L (ref 135–145)

## 2020-02-28 LAB — SARS CORONAVIRUS 2 (TAT 6-24 HRS): SARS Coronavirus 2: NEGATIVE

## 2020-02-28 MED ORDER — PLENVU 140 G PO SOLR: 1.0000 | Freq: Once | ORAL | 0 refills | Status: AC

## 2020-02-28 NOTE — Telephone Encounter (Signed)
Patient needs Plenvu (copay card) ° °

## 2020-03-01 ENCOUNTER — Encounter (HOSPITAL_COMMUNITY)
Admission: RE | Disposition: A | Discharge status: Home / Self Care | Payer: Self-pay | Source: Home / Self Care | Attending: Gastroenterology

## 2020-03-01 ENCOUNTER — Encounter (HOSPITAL_COMMUNITY): Payer: Self-pay | Admitting: Gastroenterology

## 2020-03-01 ENCOUNTER — Ambulatory Visit (HOSPITAL_COMMUNITY): Payer: Medicare Other | Admitting: Anesthesiology

## 2020-03-01 ENCOUNTER — Ambulatory Visit (HOSPITAL_COMMUNITY)
Admission: RE | Admit: 2020-03-01 | Discharge: 2020-03-01 | Disposition: A | Discharge status: Home / Self Care | Payer: Medicare Other | Attending: Gastroenterology | Admitting: Gastroenterology

## 2020-03-01 DIAGNOSIS — Z87891 Personal history of nicotine dependence: Secondary | ICD-10-CM | POA: Diagnosis not present

## 2020-03-01 DIAGNOSIS — K31811 Angiodysplasia of stomach and duodenum with bleeding: Secondary | ICD-10-CM | POA: Diagnosis not present

## 2020-03-01 DIAGNOSIS — D509 Iron deficiency anemia, unspecified: Secondary | ICD-10-CM | POA: Diagnosis not present

## 2020-03-01 DIAGNOSIS — D128 Benign neoplasm of rectum: Secondary | ICD-10-CM | POA: Diagnosis not present

## 2020-03-01 DIAGNOSIS — R195 Other fecal abnormalities: Secondary | ICD-10-CM | POA: Diagnosis not present

## 2020-03-01 DIAGNOSIS — Z880 Allergy status to penicillin: Secondary | ICD-10-CM | POA: Diagnosis not present

## 2020-03-01 DIAGNOSIS — Z951 Presence of aortocoronary bypass graft: Secondary | ICD-10-CM | POA: Diagnosis not present

## 2020-03-01 DIAGNOSIS — Z8673 Personal history of transient ischemic attack (TIA), and cerebral infarction without residual deficits: Secondary | ICD-10-CM | POA: Insufficient documentation

## 2020-03-01 DIAGNOSIS — I252 Old myocardial infarction: Secondary | ICD-10-CM | POA: Diagnosis not present

## 2020-03-01 DIAGNOSIS — G4733 Obstructive sleep apnea (adult) (pediatric): Secondary | ICD-10-CM | POA: Diagnosis not present

## 2020-03-01 DIAGNOSIS — K621 Rectal polyp: Secondary | ICD-10-CM

## 2020-03-01 DIAGNOSIS — I2510 Atherosclerotic heart disease of native coronary artery without angina pectoris: Secondary | ICD-10-CM | POA: Diagnosis not present

## 2020-03-01 DIAGNOSIS — Z7901 Long term (current) use of anticoagulants: Secondary | ICD-10-CM | POA: Diagnosis not present

## 2020-03-01 DIAGNOSIS — E785 Hyperlipidemia, unspecified: Secondary | ICD-10-CM | POA: Diagnosis not present

## 2020-03-01 DIAGNOSIS — M109 Gout, unspecified: Secondary | ICD-10-CM | POA: Insufficient documentation

## 2020-03-01 DIAGNOSIS — G4730 Sleep apnea, unspecified: Secondary | ICD-10-CM | POA: Diagnosis not present

## 2020-03-01 DIAGNOSIS — K5730 Diverticulosis of large intestine without perforation or abscess without bleeding: Secondary | ICD-10-CM | POA: Insufficient documentation

## 2020-03-01 DIAGNOSIS — I110 Hypertensive heart disease with heart failure: Secondary | ICD-10-CM | POA: Diagnosis not present

## 2020-03-01 DIAGNOSIS — I509 Heart failure, unspecified: Secondary | ICD-10-CM | POA: Diagnosis not present

## 2020-03-01 DIAGNOSIS — D649 Anemia, unspecified: Secondary | ICD-10-CM | POA: Diagnosis not present

## 2020-03-01 DIAGNOSIS — M1990 Unspecified osteoarthritis, unspecified site: Secondary | ICD-10-CM | POA: Diagnosis not present

## 2020-03-01 DIAGNOSIS — K219 Gastro-esophageal reflux disease without esophagitis: Secondary | ICD-10-CM | POA: Diagnosis not present

## 2020-03-01 DIAGNOSIS — K635 Polyp of colon: Secondary | ICD-10-CM | POA: Diagnosis not present

## 2020-03-01 DIAGNOSIS — Z955 Presence of coronary angioplasty implant and graft: Secondary | ICD-10-CM | POA: Diagnosis not present

## 2020-03-01 DIAGNOSIS — Z8249 Family history of ischemic heart disease and other diseases of the circulatory system: Secondary | ICD-10-CM | POA: Insufficient documentation

## 2020-03-01 DIAGNOSIS — Z7982 Long term (current) use of aspirin: Secondary | ICD-10-CM | POA: Insufficient documentation

## 2020-03-01 DIAGNOSIS — I4891 Unspecified atrial fibrillation: Secondary | ICD-10-CM | POA: Diagnosis not present

## 2020-03-01 DIAGNOSIS — Z79899 Other long term (current) drug therapy: Secondary | ICD-10-CM | POA: Insufficient documentation

## 2020-03-01 DIAGNOSIS — D120 Benign neoplasm of cecum: Secondary | ICD-10-CM | POA: Diagnosis not present

## 2020-03-01 HISTORY — PX: ESOPHAGOGASTRODUODENOSCOPY (EGD) WITH PROPOFOL: SHX5813

## 2020-03-01 HISTORY — PX: COLONOSCOPY WITH PROPOFOL: SHX5780

## 2020-03-01 HISTORY — PX: POLYPECTOMY: SHX5525

## 2020-03-01 LAB — HM COLONOSCOPY

## 2020-03-01 SURGERY — COLONOSCOPY WITH PROPOFOL
Anesthesia: General

## 2020-03-01 MED ORDER — LACTATED RINGERS IV SOLN: INTRAVENOUS | Status: DC | PRN

## 2020-03-01 MED ORDER — LIDOCAINE VISCOUS HCL 2 % MT SOLN
OROMUCOSAL | Status: AC
  Filled 2020-03-01: qty 15

## 2020-03-01 MED ORDER — EPHEDRINE SULFATE-NACL 50-0.9 MG/10ML-% IV SOSY
PREFILLED_SYRINGE | INTRAVENOUS | Status: DC | PRN
  Administered 2020-03-01: 5 mg via INTRAVENOUS

## 2020-03-01 MED ORDER — PHENYLEPHRINE 40 MCG/ML (10ML) SYRINGE FOR IV PUSH (FOR BLOOD PRESSURE SUPPORT)
PREFILLED_SYRINGE | INTRAVENOUS | Status: AC
  Filled 2020-03-01: qty 10

## 2020-03-01 MED ORDER — STERILE WATER FOR IRRIGATION IR SOLN
Status: DC | PRN
  Administered 2020-03-01: 1.5 mL

## 2020-03-01 MED ORDER — LIDOCAINE 2% (20 MG/ML) 5 ML SYRINGE
INTRAMUSCULAR | Status: DC | PRN
  Administered 2020-03-01: 40 mg via INTRAVENOUS
  Administered 2020-03-01: 60 mg via INTRAVENOUS

## 2020-03-01 MED ORDER — PROPOFOL 500 MG/50ML IV EMUL
INTRAVENOUS | Status: DC | PRN
  Administered 2020-03-01: 100 ug/kg/min via INTRAVENOUS

## 2020-03-01 MED ORDER — PHENYLEPHRINE 40 MCG/ML (10ML) SYRINGE FOR IV PUSH (FOR BLOOD PRESSURE SUPPORT)
PREFILLED_SYRINGE | INTRAVENOUS | Status: DC | PRN
  Administered 2020-03-01 (×3): 80 ug via INTRAVENOUS

## 2020-03-01 MED ORDER — LIDOCAINE VISCOUS HCL 2 % MT SOLN
15.0000 mL | Freq: Once | OROMUCOSAL | Status: AC
  Administered 2020-03-01: 15 mL via OROMUCOSAL

## 2020-03-01 MED ORDER — LIDOCAINE 2% (20 MG/ML) 5 ML SYRINGE
INTRAMUSCULAR | Status: AC
  Filled 2020-03-01: qty 5

## 2020-03-01 MED ORDER — PROPOFOL 10 MG/ML IV BOLUS
INTRAVENOUS | Status: DC | PRN
  Administered 2020-03-01: 70 mg via INTRAVENOUS

## 2020-03-01 MED ORDER — LACTATED RINGERS IV SOLN: Freq: Once | INTRAVENOUS | Status: AC

## 2020-03-01 MED ORDER — EPHEDRINE 5 MG/ML INJ
INTRAVENOUS | Status: AC
  Filled 2020-03-01: qty 10

## 2020-03-01 NOTE — Anesthesia Preprocedure Evaluation (Signed)
Anesthesia Evaluation  Patient identified by MRN, date of birth, ID band Patient awake    Reviewed: Allergy & Precautions, NPO status , Patient's Chart, lab work & pertinent test results  History of Anesthesia Complications Negative for: history of anesthetic complications  Airway Mallampati: III  TM Distance: >3 FB Neck ROM: Full    Dental  (+) Dental Advisory Given, Chipped, Missing, Partial Upper   Pulmonary shortness of breath and with exertion, sleep apnea and Continuous Positive Airway Pressure Ventilation , former smoker,    Pulmonary exam normal breath sounds clear to auscultation       Cardiovascular hypertension, Pt. on medications and Pt. on home beta blockers + angina + CAD, + Past MI, + Cardiac Stents, + CABG and +CHF  + dysrhythmias (PVCs) Atrial Fibrillation  Rhythm:Irregular Rate:Normal  1. Left ventricular ejection fraction, by visual estimation, is 55 to  60%. The left ventricle has normal function. There is no left ventricular  hypertrophy.  2. Global right ventricle was not well visualized.The right ventricular  size is not well visualized. Right vetricular wall thickness was not  assessed.  3. Left atrial size was not well visualized.  4. Right atrial size was not well visualized.  5. Moderate pericardial effusion.  6. There is a moderate size pericardial effusion posterior to the heart.  There is no RV or RA collapse. The IVC is not dilated . There is  significant respiratory variability with the MV and TV inflows but these  changes may be from other causes.  7. The mitral valve was not well visualized. No evidence of mitral valve  regurgitation.  8. The tricuspid valve is not well visualized. Tricuspid valve  regurgitation was not assessed by color flow Doppler.  9. The aortic valve was not well visualized Aortic valve regurgitation  was not assessed by color flow Doppler.  10. The pulmonic  valve was not well visualized. Pulmonic valve  regurgitation was not assessed by color flow Doppler.  11. The aortic root was not well visualized.  12. The interatrial septum was not well visualized. 07-Apr-2019 13:15:26 [id number] ROUTINE RECORD Atrial fibrillation ST & T wave abnormality, consider inferolateral ischemia Abnormal ECG No significant change since last tracing Confirmed by Candee Furbish [phone]) on 04/07/2019 5:40:04 PM    Neuro/Psych PSYCHIATRIC DISORDERS Anxiety CVA (03/2019 - 2 weeks after CABG), Residual Symptoms    GI/Hepatic GERD  Medicated,(+)     substance abuse  alcohol use and marijuana use,   Endo/Other  negative endocrine ROS  Renal/GU negative Renal ROS  negative genitourinary   Musculoskeletal  (+) Arthritis , narcotic dependent  Abdominal   Peds  Hematology  (+) anemia ,   Anesthesia Other Findings   Reproductive/Obstetrics                            Anesthesia Physical Anesthesia Plan  ASA: III  Anesthesia Plan: General   Post-op Pain Management:    Induction: Intravenous  PONV Risk Score and Plan: TIVA  Airway Management Planned: Nasal Cannula, Natural Airway and Simple Face Mask  Additional Equipment:   Intra-op Plan:   Post-operative Plan:   Informed Consent: I have reviewed the patients History and Physical, chart, labs and discussed the procedure including the risks, benefits and alternatives for the proposed anesthesia with the patient or authorized representative who has indicated his/her understanding and acceptance.     Dental advisory given  Plan Discussed with:  CRNA and Surgeon  Anesthesia Plan Comments:         Anesthesia Quick Evaluation

## 2020-03-01 NOTE — Discharge Instructions (Signed)
You are being discharged to home.  Continue your present medications.  We are waiting for your pathology results.  Resume taking Eliquis (apixaban) at your prior dose tomorrow.  Your physician has recommended a repeat colonoscopy (date to be determined after pending pathology results are reviewed) for surveillance.  Eat a high fiber diet.   Colonoscopy, Adult, Care After This sheet gives you information about how to care for yourself after your procedure. Your doctor may also give you more specific instructions. If you have problems or questions, call your doctor. What can I expect after the procedure? After the procedure, it is common to have:  A small amount of blood in your poop (stool) for 24 hours.  Some gas.  Mild cramping or bloating in your belly (abdomen). Follow these instructions at home: Eating and drinking   Drink enough fluid to keep your pee (urine) pale yellow.  Follow instructions from your doctor about what you cannot eat or drink.  Return to your normal diet as told by your doctor. Avoid heavy or fried foods that are hard to digest. Activity  Rest as told by your doctor.  Do not sit for a long time without moving. Get up to take short walks every 1-2 hours. This is important. Ask for help if you feel weak or unsteady.  Return to your normal activities as told by your doctor. Ask your doctor what activities are safe for you. To help cramping and bloating:   Try walking around.  Put heat on your belly as told by your doctor. Use the heat source that your doctor recommends, such as a moist heat pack or a heating pad. ? Put a towel between your skin and the heat source. ? Leave the heat on for 20-30 minutes. ? Remove the heat if your skin turns bright red. This is very important if you are unable to feel pain, heat, or cold. You may have a greater risk of getting burned. General instructions  For the first 24 hours after the procedure: ? Do not drive or use  machinery. ? Do not sign important documents. ? Do not drink alcohol. ? Do your daily activities more slowly than normal. ? Eat foods that are soft and easy to digest.  Take over-the-counter or prescription medicines only as told by your doctor.  Keep all follow-up visits as told by your doctor. This is important. Contact a doctor if:  You have blood in your poop 2-3 days after the procedure. Get help right away if:  You have more than a small amount of blood in your poop.  You see large clumps of tissue (blood clots) in your poop.  Your belly is swollen.  You feel like you may vomit (nauseous).  You vomit.  You have a fever.  You have belly pain that gets worse, and medicine does not help your pain. Summary  After the procedure, it is common to have a small amount of blood in your poop. You may also have mild cramping and bloating in your belly.  For the first 24 hours after the procedure, do not drive or use machinery, do not sign important documents, and do not drink alcohol.  Get help right away if you have a lot of blood in your poop, feel like you may vomit, have a fever, or have more belly pain. This information is not intended to replace advice given to you by your health care provider. Make sure you discuss any questions you have with  your health care provider. Document Revised: 12/19/2018 Document Reviewed: 12/19/2018 Elsevier Patient Education  [place].  Colon Polyps  Polyps are tissue growths inside the body. Polyps can grow in many places, including the large intestine (colon). A polyp may be a round bump or a mushroom-shaped growth. You could have one polyp or several. Most colon polyps are noncancerous (benign). However, some colon polyps can become cancerous over time. Finding and removing the polyps early can help prevent this. What are the causes? The exact cause of colon polyps is not known. What increases the risk? You are more likely to  develop this condition if you:  Have a family history of colon cancer or colon polyps.  Are older than 16 or older than 45 if you are African American.  Have inflammatory bowel disease, such as ulcerative colitis or Crohn's disease.  Have certain hereditary conditions, such as: ? Familial adenomatous polyposis. ? Lynch syndrome. ? Turcot syndrome. ? Peutz-Jeghers syndrome.  Are overweight.  Smoke cigarettes.  Do not get enough exercise.  Drink too much alcohol.  Eat a diet that is high in fat and red meat and low in fiber.  Had childhood cancer that was treated with abdominal radiation. What are the signs or symptoms? Most polyps do not cause symptoms. If you have symptoms, they may include:  Blood coming from your rectum when having a bowel movement.  Blood in your stool. The stool may look dark red or black.  Abdominal pain.  A change in bowel habits, such as constipation or diarrhea. How is this diagnosed? This condition is diagnosed with a colonoscopy. This is a procedure in which a lighted, flexible scope is inserted into the anus and then passed into the colon to examine the area. Polyps are sometimes found when a colonoscopy is done as part of routine cancer screening tests. How is this treated? Treatment for this condition involves removing any polyps that are found. Most polyps can be removed during a colonoscopy. Those polyps will then be tested for cancer. Additional treatment may be needed depending on the results of testing. Follow these instructions at home: Lifestyle  Maintain a healthy weight, or lose weight if recommended by your health care provider.  Exercise every day or as told by your health care provider.  Do not use any products that contain nicotine or tobacco, such as cigarettes and e-cigarettes. If you need help quitting, ask your health care provider.  If you drink alcohol, limit how much you have: ? 0-1 drink a day for women. ? 0-2  drinks a day for men.  Be aware of how much alcohol is in your drink. In the [place] one drink equals one 12 oz bottle of beer (355 mL), one 5 oz glass of wine (148 mL), or one 1 oz shot of hard liquor (44 mL). Eating and drinking   Eat foods that are high in fiber, such as fruits, vegetables, and whole grains.  Eat foods that are high in calcium and vitamin D, such as milk, cheese, yogurt, eggs, liver, fish, and broccoli.  Limit foods that are high in fat, such as fried foods and desserts.  Limit the amount of red meat and processed meat you eat, such as hot dogs, sausage, bacon, and lunch meats. General instructions  Keep all follow-up visits as told by your health care provider. This is important. ? This includes having regularly scheduled colonoscopies. ? Talk to your health care provider about when you need a colonoscopy.  Contact a health care provider if:  You have new or worsening bleeding during a bowel movement.  You have new or increased blood in your stool.  You have a change in bowel habits.  You lose weight for no known reason. Summary  Polyps are tissue growths inside the body. Polyps can grow in many places, including the colon.  Most colon polyps are noncancerous (benign), but some can become cancerous over time.  This condition is diagnosed with a colonoscopy.  Treatment for this condition involves removing any polyps that are found. Most polyps can be removed during a colonoscopy. This information is not intended to replace advice given to you by your health care provider. Make sure you discuss any questions you have with your health care provider. Document Revised: 09/09/2017 Document Reviewed: 09/09/2017 Elsevier Patient Education  [place] After These instructions provide you with information about caring for yourself after your procedure. Your health care provider may also give you more specific instructions.  Your treatment has been planned according to current medical practices, but problems sometimes occur. Call your health care provider if you have any problems or questions after your procedure. What can I expect after the procedure? After your procedure, you may:  Feel sleepy for several hours.  Feel clumsy and have poor balance for several hours.  Feel forgetful about what happened after the procedure.  Have poor judgment for several hours.  Feel nauseous or vomit.  Have a sore throat if you had a breathing tube during the procedure. Follow these instructions at home: For at least 24 hours after the procedure:      Have a responsible adult stay with you. It is important to have someone help care for you until you are awake and alert.  Rest as needed.  Do not: ? Participate in activities in which you could fall or become injured. ? Drive. ? Use heavy machinery. ? Drink alcohol. ? Take sleeping pills or medicines that cause drowsiness. ? Make important decisions or sign legal documents. ? Take care of children on your own. Eating and drinking  Follow the diet that is recommended by your health care provider.  If you vomit, drink water, juice, or soup when you can drink without vomiting.  Make sure you have little or no nausea before eating solid foods. General instructions  Take over-the-counter and prescription medicines only as told by your health care provider.  If you have sleep apnea, surgery and certain medicines can increase your risk for breathing problems. Follow instructions from your health care provider about wearing your sleep device: ? Anytime you are sleeping, including during daytime naps. ? While taking prescription pain medicines, sleeping medicines, or medicines that make you drowsy.  If you smoke, do not smoke without supervision.  Keep all follow-up visits as told by your health care provider. This is important. Contact a health care provider  if:  You keep feeling nauseous or you keep vomiting.  You feel light-headed.  You develop a rash.  You have a fever. Get help right away if:  You have trouble breathing. Summary  For several hours after your procedure, you may feel sleepy and have poor judgment.  Have a responsible adult stay with you for at least 24 hours or until you are awake and alert. This information is not intended to replace advice given to you by your health care provider. Make sure you discuss any questions you have with your health  care provider. Document Revised: 08/23/2017 Document Reviewed: 09/15/2015 Elsevier Patient Education  [place].

## 2020-03-01 NOTE — H&P (Signed)
Martin [name] is an 64 y.o. male.   Chief Complaint: Iron deficiency anemia HPI: 64 y.o. male with past medical history of atrial fibrillation on Eliquis, coronary artery disease status post PCI with stent placement in 2011, carotid artery disease, hyperlipidemia, gout,, hypertension, OSA, h/o stroke, who comes to the hospital for evaluation of iron deficiency anemia.  The patient denies having any symptoms but was found to have positive FOBT at his PCPs office.  He was also found to have anemia with a hemoglobin of 10.4 and MCV of 86.  Other labs were otherwise unremarkable.  She has been having this anemia in his records at least since October 2020.  He had a colonoscopy in 2019 which was unremarkable but has never had an EGD.  Most recent labs showed low iron stores with a ferritin of 10, iron 32, increased TIBC 450 consistent with iron deficiency anemia.  No family history of colorectal cancer.  Past Medical History:  Diagnosis Date  . Atrial fibrillation ([place])    a. dx 10/2017. b. s/p DCCV on 11/19/2017 with successful conversion to NSR but back in AFIB 3 weeks later  . CAD (coronary artery disease)    a. s/p PCI to LAD 2001. b. s/p stenting to the RCA and mid LAD December 2011, residual distal RCA disease treated medically. b. nuc 02/2013 abnormal with fixed defect seen in inferoapical, mid-inferior, and basal inferior walls, indicative of myocardial scar, no evidence of ischemia, EF 41% (Ef 55-60% by echo same time).  . Carotid artery disease ([place])    carotid Doppler course left side 5069% stenosis  . Dysrhythmia    AFib  . GERD (gastroesophageal reflux disease)   . Habitual alcohol use   . Hyperlipidemia   . Hypertension   . Low back pain   . Myocardial infarction ([place]) 2020  . OSA on CPAP   . Osteoarthritis   . Polysubstance abuse ([place])    use including marijuana and vicodin,which he obtained from the street  . PVC's (premature ventricular contractions)    status post Holter  monitor normal sinus rhythm otherwise asymptomatic PVCs.  . Seasonal allergies   . Stroke [facility]) 2020    Past Surgical History:  Procedure Laterality Date  . CARDIOVERSION N/A 11/19/2017   Procedure: CARDIOVERSION;  Surgeon: Herminio Commons, MD;  [facility];  Service: Cardiovascular;  Laterality: N/A;  . CORONARY ANGIOPLASTY WITH STENT PLACEMENT  2011  . CORONARY ARTERY BYPASS GRAFT N/A 03/10/2019   Procedure: CORONARY ARTERY BYPASS GRAFTING (CABG)X 3 Using Left Internal Mammary Artery and Right Saphenous Vein Grafts;  Surgeon: Ivin Poot, MD;  Location: [place];  Service: Open Heart Surgery;  Laterality: N/A;  . LEFT HEART CATH AND CORONARY ANGIOGRAPHY N/A 03/08/2019   Procedure: LEFT HEART CATH AND CORONARY ANGIOGRAPHY;  Surgeon: Belva Crome, MD;  Location: [place] CV LAB;  Service: Cardiovascular;  Laterality: N/A;  . TEE WITHOUT CARDIOVERSION N/A 03/10/2019   Procedure: TRANSESOPHAGEAL ECHOCARDIOGRAM (TEE);  Surgeon: Prescott Gum, Collier Salina, MD;  Location: [place];  Service: Open Heart Surgery;  Laterality: N/A;  . TONSILLECTOMY      Family History  Problem Relation Age of Onset  . Stroke Mother   . Other Mother        small bowel obstruction  . Heart attack Father   . Heart attack Brother   . Healthy Son    Social History:  reports that he quit smoking about 20 years ago. His smoking use  included cigarettes. He started smoking about 52 years ago. He has a 35.00 pack-year smoking history. He has never used smokeless tobacco. He reports previous alcohol use. He reports current drug use. Drug: Marijuana.  Allergies:  Allergies  Allergen Reactions  . Penicillins Hives and Rash    Has patient had a PCN reaction causing immediate rash, facial/tongue/throat swelling, SOB or lightheadedness with hypotension: Unknown Has patient had a PCN reaction causing severe rash involving mucus membranes or skin necrosis: Unknown Has patient had a PCN reaction that required  hospitalization: No Has patient had a PCN reaction occurring within the last 10 years: No If all of the above answers are "NO", then may proceed with Cephalosporin use.      Medications Prior to Admission  Medication Sig Dispense Refill  . acetaminophen (TYLENOL) 325 MG tablet Take 1-2 tablets (325-650 mg total) by mouth every 4 (four) hours as needed for mild pain.    Marland Kitchen allopurinol (ZYLOPRIM) 300 MG tablet Take 300 mg by mouth daily.     Marland Kitchen amLODipine (NORVASC) 5 MG tablet Take 1 tablet (5 mg total) by mouth daily. 90 tablet 3  . aspirin EC 81 MG EC tablet Take 1 tablet (81 mg total) by mouth daily. 100 tablet 1  . atorvastatin (LIPITOR) 80 MG tablet Take 1 tablet (80 mg total) by mouth daily. (Patient taking differently: Take 80 mg by mouth at bedtime. ) 90 tablet 1  . Cholecalciferol (VITAMIN D-3) 1000 UNITS CAPS Take 1,000 Units by mouth every morning.     . citalopram (CELEXA) 10 MG tablet TAKE 1 TABLET BY MOUTH EVERY DAY (Patient taking differently: Take 10 mg by mouth daily. ) 90 tablet 1  . colchicine 0.6 MG tablet Take 1 tablet (0.6 mg total) by mouth daily at 6 PM. 30 tablet 1  . Cyanocobalamin (VITAMIN B-12) 2500 MCG SUBL Place 2,500 mcg under the tongue every morning.     . docusate sodium (COLACE) 100 MG capsule Take 2 capsules (200 mg total) by mouth daily. (Patient taking differently: Take 100 mg by mouth 2 (two) times daily. ) 60 capsule 0  . ELIQUIS 5 MG TABS tablet TAKE 1 TABLET BY MOUTH TWICE A DAY (Patient taking differently: Take 5 mg by mouth 2 (two) times daily. ) 180 tablet 2  . fluticasone (FLONASE) 50 MCG/ACT nasal spray Place 2 sprays into the nose daily.      . folic acid (FOLVITE) 1 MG tablet TAKE ONE TABLET BY MOUTH EVERY DAY (Patient taking differently: Take 1 mg by mouth daily. ) 30 tablet 6  . furosemide (LASIX) 20 MG tablet Take 1 tablet (20 mg total) by mouth 2 (two) times daily. 180 tablet 3  . HYDROcodone-acetaminophen (NORCO/VICODIN) 5-325 MG tablet Take 1  tablet by mouth every 8 (eight) hours as needed.    Marland Kitchen lisinopril (ZESTRIL) 10 MG tablet Take 1 tablet (10 mg total) by mouth daily. 90 tablet 3  . loratadine (CLARITIN) 10 MG tablet Take 10 mg by mouth daily.     Marland Kitchen LORazepam (ATIVAN) 0.5 MG tablet Take 0.5 mg by mouth at bedtime.     . meclizine (ANTIVERT) 32 MG tablet Take 32 mg by mouth daily as needed for dizziness.     . metoprolol tartrate (LOPRESSOR) 25 MG tablet Take 1 tablet (25 mg total) by mouth every morning. (Patient taking differently: Take 25 mg by mouth daily. ) 90 tablet 3  . pantoprazole (PROTONIX) 40 MG tablet Take 1 tablet (40 mg  total) by mouth daily. (Patient taking differently: Take 40 mg by mouth at bedtime. ) 30 tablet 0  . polyethylene glycol (MIRALAX / GLYCOLAX) packet Take 17 g by mouth at bedtime as needed for mild constipation.     . potassium chloride SA (KLOR-CON M15) 15 MEQ tablet Take 2 tablets (30 mEq total) by mouth daily. (Patient taking differently: Take 15 mEq by mouth 2 (two) times daily. ) 180 tablet 3  . tamsulosin (FLOMAX) 0.4 MG CAPS capsule Take 1 capsule (0.4 mg total) by mouth 2 (two) times daily. 60 capsule 0  . thiamine 100 MG tablet Take 1 tablet (100 mg total) by mouth daily. 30 tablet 0  . traZODone (DESYREL) 100 MG tablet TAKE 1 TABLET (100 MG TOTAL) BY MOUTH AT BEDTIME AS NEEDED FOR SLEEP. 90 tablet 1  . nitroGLYCERIN (NITROSTAT) 0.4 MG SL tablet PLACE 1 TABLET (0.4 MG TOTAL) UNDER THE TONGUE EVERY 5 (FIVE) MINUTES AS NEEDED. (Patient taking differently: Place 0.4 mg under the tongue every 5 (five) minutes as needed for chest pain. ) 25 tablet 3    Results for orders placed or performed during the hospital encounter of 02/28/20 (from the past 48 hour(s))  SARS CORONAVIRUS 2 (TAT 6-24 HRS) Nasopharyngeal Nasopharyngeal Swab     Status: None   Collection Time: 02/28/20  1:56 PM   Specimen: Nasopharyngeal Swab  Result Value Ref Range   SARS Coronavirus 2 NEGATIVE NEGATIVE    Comment:  (NOTE) SARS-CoV-2 target nucleic acids are NOT DETECTED.  The SARS-CoV-2 RNA is generally detectable in upper and lower respiratory specimens during the acute phase of infection. Negative results do not preclude SARS-CoV-2 infection, do not rule out co-infections with other pathogens, and should not be used as the sole basis for treatment or other patient management decisions. Negative results must be combined with clinical observations, patient history, and epidemiological information. The expected result is Negative.  Fact Sheet for Patients: SugarRoll.be  Fact Sheet for Healthcare Providers: https://www.woods-mathews.com/  This test is not yet approved or cleared by the [place] FDA and  has been authorized for detection and/or diagnosis of SARS-CoV-2 by FDA under an Emergency Use Authorization (EUA). This EUA will remain  in effect (meaning this test can be used) for the duration of the COVID-19 declaration under Se ction 564(b)(1) of the Act, 21 U.S.C. section 360bbb-3(b)(1), unless the authorization is terminated or revoked sooner.  Performed at [facility], [place] [address], [place], [place] [address]   Basic metabolic panel     Status: Abnormal   Collection Time: 02/28/20  2:09 PM  Result Value Ref Range   Sodium 138 135 - 145 mmol/L   Potassium 3.9 3.5 - 5.1 mmol/L   Chloride 106 98 - 111 mmol/L   CO2 24 22 - 32 mmol/L   Glucose, Bld 141 (H) 70 - 99 mg/dL    Comment: Glucose reference range applies only to samples taken after fasting for at least 8 hours.   BUN 18 8 - 23 mg/dL   Creatinine, Ser 0.88 0.61 - 1.24 mg/dL   Calcium 8.7 (L) 8.9 - 10.3 mg/dL   GFR calc non Af Amer >60 >60 mL/min   GFR calc Af Amer >60 >60 mL/min   Anion gap 8 5 - 15    Comment: Performed at [facility], [address], [place], [place] [address]   No results found.  Review of Systems  Constitutional: Negative.   HENT: Negative.    Eyes:  Negative.   Respiratory: Negative.   Cardiovascular: Negative.   Endocrine: Negative.   Genitourinary: Negative.   Musculoskeletal: Negative.   Skin: Negative.   Neurological: Negative.   Hematological: Negative.   Psychiatric/Behavioral: Negative.     Blood pressure (!) 168/77, pulse (!) 58, temperature 98.2 F (36.8 C), temperature source Oral, resp. rate 14, height 5\' 8"  (1.727 m), weight 109.8 kg, SpO2 100 %. Physical Exam  GENERAL: The patient is AO x3, in no acute distress. Obese. HEENT: Head is normocephalic and atraumatic. EOMI are intact. Mouth is well hydrated and without lesions. NECK: Supple. No masses LUNGS: Clear to auscultation. No presence of rhonchi/wheezing/rales. Adequate chest expansion HEART: RRR, normal s1 and s2. ABDOMEN: Soft, nontender, no guarding, no peritoneal signs, and nondistended. BS +. No masses. EXTREMITIES: Without any cyanosis, clubbing, rash, lesions or edema. NEUROLOGIC: AOx3, no focal motor deficit. SKIN: no jaundice, no rashes  Assessment/Plan 64 y.o. male with past medical history of atrial fibrillation on Eliquis, coronary artery disease status post PCI with stent placement in 2011, carotid artery disease, hyperlipidemia, gout,, hypertension, OSA, h/o stroke, who comes to the hospital for evaluation of iron deficiency anemia.  We will proceed with EGD and colonoscopy with small bowel biopsies.  Martin Quale, MD 03/01/2020, 7:42 AM

## 2020-03-01 NOTE — Op Note (Signed)
[facility] Patient Name: Martin [name] Procedure Date: 03/01/2020 8:10 AM MRN: [id number] Date of Birth: 1955-11-24 Attending MD: Maylon Peppers ,  CSN: [id number] Age: 64 Admit Type: Outpatient Procedure:                Colonoscopy Indications:              Iron deficiency anemia Providers:                Maylon Peppers, Caprice Kluver, Aram Candela Referring MD:              Medicines:                Monitored Anesthesia Care Complications:            No immediate complications. Estimated Blood Loss:     Estimated blood loss: none. Procedure:                Pre-Anesthesia Assessment:                           - Prior to the procedure, a History and Physical                            was performed, and patient medications, allergies                            and sensitivities were reviewed. The patient's                            tolerance of previous anesthesia was reviewed.                           - The risks and benefits of the procedure and the                            sedation options and risks were discussed with the                            patient. All questions were answered and informed                            consent was obtained.                           - ASA Grade Assessment: III - A patient with severe                            systemic disease.                           After obtaining informed consent, the colonoscope                            was passed under direct vision. Throughout the                            procedure, the patient's blood pressure, pulse, and  oxygen saturations were monitored continuously. The                            [id number]-[id number]) was introduced through the anus                            and advanced to the the terminal ileum. The                            colonoscopy was performed without difficulty. The                            patient tolerated the procedure well. Scope In:  8:13:14 AM Scope Out: 8:35:35 AM Scope Withdrawal Time: 0 hours 15 minutes 47 seconds  Total Procedure Duration: 0 hours 22 minutes 21 seconds  Findings:      The terminal ileum appeared normal.      The perianal and digital rectal examinations were normal.      A 3 mm polyp was found in the cecum. The polyp was sessile. The polyp       was removed with a cold biopsy forceps. Resection and retrieval were       complete.      Two sessile polyps were found in the rectum. The polyps were 3 to 4 mm       in size. These polyps were removed with a cold snare. Resection and       retrieval were complete.      Multiple small and large-mouthed diverticula were found in the sigmoid       colon and descending colon.      The retroflexed view of the distal rectum and anal verge was normal and       showed no anal or rectal abnormalities. Impression:               - The examined portion of the ileum was normal.                           - One 3 mm polyp in the cecum, removed with a cold                            biopsy forceps. Resected and retrieved.                           - Two 3 to 4 mm polyps in the rectum, removed with                            a cold snare. Resected and retrieved.                           - Diverticulosis in the sigmoid colon and in the                            descending colon.                           - The distal rectum and anal verge  are normal on                            retroflexion view. Moderate Sedation:      Per Anesthesia Care Recommendation:           - Discharge patient to home (ambulatory).                           - High fiber diet.                           - Await pathology results.                           - Repeat colonoscopy date to be determined after                            pending pathology results are reviewed for                            surveillance. Procedure Code(s):        --- Professional ---                           [phone],  GC, Colonoscopy, flexible; with removal of                            tumor(s), polyp(s), or other lesion(s) by snare                            technique                           30160, 79, Colonoscopy, flexible; with biopsy,                            single or multiple Diagnosis Code(s):        --- Professional ---                           K63.5, Polyp of colon                           K62.1, Rectal polyp                           D50.9, Iron deficiency anemia, unspecified                           K57.30, Diverticulosis of large intestine without                            perforation or abscess without bleeding CPT copyright 2019 [employer]. All rights reserved. The codes documented in this report are preliminary and upon coder review may  be revised to meet current compliance requirements. Maylon Peppers, MD Maylon Peppers,  03/01/2020 8:47:27 AM This report has been signed electronically. Number of Addenda: 0

## 2020-03-01 NOTE — Op Note (Signed)
[facility] Patient Name: Martin [name] Procedure Date: 03/01/2020 7:09 AM MRN: [id number] Date of Birth: 1955/09/12 Attending MD: Maylon Peppers ,  CSN: [id number] Age: 64 Admit Type: Outpatient Procedure:                Upper GI endoscopy Indications:              Iron deficiency anemia Providers:                Maylon Peppers, Jeanann Lewandowsky. Sharon Seller, RN, Caprice Kluver, Aram Candela Referring MD:              Medicines:                Monitored Anesthesia Care Complications:            No immediate complications. Estimated Blood Loss:     Estimated blood loss: none. Procedure:                Pre-Anesthesia Assessment:                           - Prior to the procedure, a History and Physical                            was performed, and patient medications, allergies                            and sensitivities were reviewed. The patient's                            tolerance of previous anesthesia was reviewed.                           - The risks and benefits of the procedure and the                            sedation options and risks were discussed with the                            patient. All questions were answered and informed                            consent was obtained.                           - ASA Grade Assessment: III - A patient with severe                            systemic disease.                           After obtaining informed consent, the endoscope was                            passed under direct vision. Throughout the  procedure, the patient's blood pressure, pulse, and                            oxygen saturations were monitored continuously. The                            [id number]-[id number] ([id number]) scope was introduced through the                            mouth, and advanced to the second part of duodenum.                            The upper GI endoscopy was accomplished without                             difficulty. The patient tolerated the procedure                            well. Scope In: 7:54:26 AM Scope Out: 8:08:14 AM Total Procedure Duration: 0 hours 13 minutes 48 seconds  Findings:      The examined esophagus was normal.      Two 3 to 5 mm angiodysplastic lesions were found in the gastric body.       One of the lesions (largest) was actively oozing blood. Coagulation for       hemostasis using argon plasma at 0.3 liters/minute and 20 watts was       successful.      The examined duodenum was normal. Biopsies for histology were taken with       a cold forceps for evaluation of celiac disease. Impression:               - Normal esophagus.                           - Two bleeding angiodysplastic lesions in the                            stomach. Treated with argon plasma coagulation                            (APC).                           - Normal examined duodenum. Biopsied. Moderate Sedation:      Per Anesthesia Care Recommendation:           - Discharge patient to home (ambulatory).                           - Resume previous diet.                           - Continue present medications.                           - Await pathology results.                           -  Resume Eliquis (apixaban) at prior dose tomorrow. Procedure Code(s):        --- Professional ---                           [phone], 59,GC, Esophagogastroduodenoscopy, flexible,                            transoral; with control of bleeding, any method Diagnosis Code(s):        --- Professional ---                           D63.875, Angiodysplasia of stomach and duodenum                            with bleeding                           D50.9, Iron deficiency anemia, unspecified CPT copyright 2019 [employer] Association. All rights reserved. The codes documented in this report are preliminary and upon coder review may  be revised to meet current compliance requirements. Maylon Peppers,  MD Maylon Peppers,  03/01/2020 8:43:13 AM This report has been signed electronically. Number of Addenda: 0

## 2020-03-01 NOTE — Transfer of Care (Signed)
Immediate Anesthesia Transfer of Care Note  Patient: Martin [name]  Procedure(s) Performed: COLONOSCOPY WITH PROPOFOL (N/A ) ESOPHAGOGASTRODUODENOSCOPY (EGD) WITH PROPOFOL (N/A ) POLYPECTOMY  Patient Location: PACU  Anesthesia Type:General  Level of Consciousness: awake, oriented and patient cooperative  Airway & Oxygen Therapy: Patient Spontanous Breathing  Post-op Assessment: Report given to RN, Post -op Vital signs reviewed and stable and Patient moving all extremities X 4  Post vital signs: Reviewed and stable  Last Vitals:   Vitals Value Taken Time  BP 133/75 03/01/20 0841  Temp    Pulse 54 03/01/20 0842  Resp 17 03/01/20 0842  SpO2 98 % 03/01/20 0842  Vitals shown include unvalidated device data.  Last Pain:  Vitals:   03/01/20 0746  TempSrc:   PainSc: 0-No pain         Complications: No complications documented.

## 2020-03-01 NOTE — Anesthesia Procedure Notes (Signed)
Date/Time: 03/01/2020 7:58 AM Performed by: Orlie Dakin, CRNA Pre-anesthesia Checklist: Patient identified, Emergency Drugs available, Suction available and Patient being monitored Oxygen Delivery Method: Nasal cannula Induction Type: IV induction Placement Confirmation: positive ETCO2

## 2020-03-01 NOTE — Anesthesia Postprocedure Evaluation (Signed)
Anesthesia Post Note  Patient: Martin [name]  Procedure(s) Performed: COLONOSCOPY WITH PROPOFOL (N/A ) ESOPHAGOGASTRODUODENOSCOPY (EGD) WITH PROPOFOL (N/A ) POLYPECTOMY  Patient location during evaluation: PACU Anesthesia Type: General Level of consciousness: awake and alert and oriented Pain management: pain level controlled Vital Signs Assessment: post-procedure vital signs reviewed and stable Respiratory status: spontaneous breathing, nonlabored ventilation and respiratory function stable Cardiovascular status: blood pressure returned to baseline Postop Assessment: no headache and no apparent nausea or vomiting Anesthetic complications: no   No complications documented.   Last Vitals:  Vitals:   03/01/20 0845 03/01/20 0900  BP: 130/67 (!) 143/69  Pulse: (!) 55 (!) 50  Resp: 18 16  Temp:    SpO2: 93% 96%    Last Pain:  Vitals:   03/01/20 0842  TempSrc:   PainSc: 0-No pain                 Orlie Dakin

## 2020-03-04 LAB — SURGICAL PATHOLOGY

## 2020-03-05 DIAGNOSIS — R7301 Impaired fasting glucose: Secondary | ICD-10-CM | POA: Diagnosis not present

## 2020-03-05 DIAGNOSIS — I10 Essential (primary) hypertension: Secondary | ICD-10-CM | POA: Diagnosis not present

## 2020-03-05 DIAGNOSIS — I25810 Atherosclerosis of coronary artery bypass graft(s) without angina pectoris: Secondary | ICD-10-CM | POA: Diagnosis not present

## 2020-03-05 DIAGNOSIS — I4891 Unspecified atrial fibrillation: Secondary | ICD-10-CM | POA: Diagnosis not present

## 2020-03-05 DIAGNOSIS — M1990 Unspecified osteoarthritis, unspecified site: Secondary | ICD-10-CM | POA: Diagnosis not present

## 2020-03-06 ENCOUNTER — Encounter (HOSPITAL_COMMUNITY): Payer: Self-pay | Admitting: Gastroenterology

## 2020-03-07 DIAGNOSIS — G4733 Obstructive sleep apnea (adult) (pediatric): Secondary | ICD-10-CM | POA: Diagnosis not present

## 2020-03-19 DIAGNOSIS — D509 Iron deficiency anemia, unspecified: Secondary | ICD-10-CM | POA: Diagnosis not present

## 2020-03-19 DIAGNOSIS — I4891 Unspecified atrial fibrillation: Secondary | ICD-10-CM | POA: Diagnosis not present

## 2020-03-19 DIAGNOSIS — M1990 Unspecified osteoarthritis, unspecified site: Secondary | ICD-10-CM | POA: Diagnosis not present

## 2020-03-19 DIAGNOSIS — R7301 Impaired fasting glucose: Secondary | ICD-10-CM | POA: Diagnosis not present

## 2020-03-19 DIAGNOSIS — I10 Essential (primary) hypertension: Secondary | ICD-10-CM | POA: Diagnosis not present

## 2020-03-25 ENCOUNTER — Other Ambulatory Visit: Payer: Self-pay | Admitting: *Deleted

## 2020-03-25 MED ORDER — AMLODIPINE BESYLATE 5 MG PO TABS: 5.0000 mg | ORAL_TABLET | Freq: Every day | ORAL | 1 refills | Status: DC

## 2020-03-26 ENCOUNTER — Other Ambulatory Visit: Payer: Self-pay | Admitting: Student

## 2020-03-26 MED ORDER — FUROSEMIDE 20 MG PO TABS: 20.0000 mg | ORAL_TABLET | Freq: Two times a day (BID) | ORAL | 3 refills | Status: DC

## 2020-03-27 ENCOUNTER — Ambulatory Visit: Payer: Medicare Other | Admitting: Internal Medicine

## 2020-04-02 DIAGNOSIS — R7301 Impaired fasting glucose: Secondary | ICD-10-CM | POA: Diagnosis not present

## 2020-04-02 DIAGNOSIS — Z0000 Encounter for general adult medical examination without abnormal findings: Secondary | ICD-10-CM | POA: Diagnosis not present

## 2020-04-02 DIAGNOSIS — I10 Essential (primary) hypertension: Secondary | ICD-10-CM | POA: Diagnosis not present

## 2020-04-02 DIAGNOSIS — I25810 Atherosclerosis of coronary artery bypass graft(s) without angina pectoris: Secondary | ICD-10-CM | POA: Diagnosis not present

## 2020-04-02 DIAGNOSIS — M1990 Unspecified osteoarthritis, unspecified site: Secondary | ICD-10-CM | POA: Diagnosis not present

## 2020-04-02 DIAGNOSIS — Z712 Person consulting for explanation of examination or test findings: Secondary | ICD-10-CM | POA: Diagnosis not present

## 2020-04-02 DIAGNOSIS — I4891 Unspecified atrial fibrillation: Secondary | ICD-10-CM | POA: Diagnosis not present

## 2020-04-04 DIAGNOSIS — M1990 Unspecified osteoarthritis, unspecified site: Secondary | ICD-10-CM | POA: Diagnosis not present

## 2020-04-04 DIAGNOSIS — M25579 Pain in unspecified ankle and joints of unspecified foot: Secondary | ICD-10-CM | POA: Diagnosis not present

## 2020-04-04 DIAGNOSIS — M79672 Pain in left foot: Secondary | ICD-10-CM | POA: Diagnosis not present

## 2020-04-08 DIAGNOSIS — G4733 Obstructive sleep apnea (adult) (pediatric): Secondary | ICD-10-CM | POA: Diagnosis not present

## 2020-04-16 ENCOUNTER — Ambulatory Visit: Payer: Medicare Other | Admitting: Neurology

## 2020-04-17 DIAGNOSIS — R7301 Impaired fasting glucose: Secondary | ICD-10-CM | POA: Diagnosis not present

## 2020-04-17 DIAGNOSIS — D509 Iron deficiency anemia, unspecified: Secondary | ICD-10-CM | POA: Diagnosis not present

## 2020-04-17 DIAGNOSIS — I10 Essential (primary) hypertension: Secondary | ICD-10-CM | POA: Diagnosis not present

## 2020-04-17 DIAGNOSIS — M1990 Unspecified osteoarthritis, unspecified site: Secondary | ICD-10-CM | POA: Diagnosis not present

## 2020-04-17 DIAGNOSIS — I4891 Unspecified atrial fibrillation: Secondary | ICD-10-CM | POA: Diagnosis not present

## 2020-04-19 DIAGNOSIS — L03116 Cellulitis of left lower limb: Secondary | ICD-10-CM | POA: Diagnosis not present

## 2020-04-25 DIAGNOSIS — M79672 Pain in left foot: Secondary | ICD-10-CM | POA: Diagnosis not present

## 2020-04-25 DIAGNOSIS — L03116 Cellulitis of left lower limb: Secondary | ICD-10-CM | POA: Diagnosis not present

## 2020-04-25 DIAGNOSIS — R21 Rash and other nonspecific skin eruption: Secondary | ICD-10-CM | POA: Diagnosis not present

## 2020-04-25 DIAGNOSIS — M25579 Pain in unspecified ankle and joints of unspecified foot: Secondary | ICD-10-CM | POA: Diagnosis not present

## 2020-04-25 DIAGNOSIS — M79671 Pain in right foot: Secondary | ICD-10-CM | POA: Diagnosis not present

## 2020-04-25 DIAGNOSIS — M1990 Unspecified osteoarthritis, unspecified site: Secondary | ICD-10-CM | POA: Diagnosis not present

## 2020-04-30 DIAGNOSIS — M1990 Unspecified osteoarthritis, unspecified site: Secondary | ICD-10-CM | POA: Diagnosis not present

## 2020-04-30 DIAGNOSIS — R7301 Impaired fasting glucose: Secondary | ICD-10-CM | POA: Diagnosis not present

## 2020-04-30 DIAGNOSIS — I10 Essential (primary) hypertension: Secondary | ICD-10-CM | POA: Diagnosis not present

## 2020-04-30 DIAGNOSIS — I25810 Atherosclerosis of coronary artery bypass graft(s) without angina pectoris: Secondary | ICD-10-CM | POA: Diagnosis not present

## 2020-04-30 DIAGNOSIS — I4891 Unspecified atrial fibrillation: Secondary | ICD-10-CM | POA: Diagnosis not present

## 2020-05-06 ENCOUNTER — Encounter (INDEPENDENT_AMBULATORY_CARE_PROVIDER_SITE_OTHER): Payer: Self-pay | Admitting: *Deleted

## 2020-05-09 DIAGNOSIS — G4733 Obstructive sleep apnea (adult) (pediatric): Secondary | ICD-10-CM | POA: Diagnosis not present

## 2020-05-16 ENCOUNTER — Other Ambulatory Visit: Payer: Self-pay | Admitting: Family Medicine

## 2020-06-07 DIAGNOSIS — I10 Essential (primary) hypertension: Secondary | ICD-10-CM | POA: Diagnosis not present

## 2020-06-07 DIAGNOSIS — I25810 Atherosclerosis of coronary artery bypass graft(s) without angina pectoris: Secondary | ICD-10-CM | POA: Diagnosis not present

## 2020-07-03 ENCOUNTER — Other Ambulatory Visit: Payer: Medicare Other

## 2020-07-06 DIAGNOSIS — G4733 Obstructive sleep apnea (adult) (pediatric): Secondary | ICD-10-CM | POA: Diagnosis not present

## 2020-07-06 DIAGNOSIS — I25810 Atherosclerosis of coronary artery bypass graft(s) without angina pectoris: Secondary | ICD-10-CM | POA: Diagnosis not present

## 2020-07-06 DIAGNOSIS — R0600 Dyspnea, unspecified: Secondary | ICD-10-CM | POA: Diagnosis not present

## 2020-07-06 DIAGNOSIS — I4891 Unspecified atrial fibrillation: Secondary | ICD-10-CM | POA: Diagnosis not present

## 2020-07-06 DIAGNOSIS — I69354 Hemiplegia and hemiparesis following cerebral infarction affecting left non-dominant side: Secondary | ICD-10-CM | POA: Diagnosis not present

## 2020-07-06 DIAGNOSIS — I10 Essential (primary) hypertension: Secondary | ICD-10-CM | POA: Diagnosis not present

## 2020-07-12 DIAGNOSIS — I10 Essential (primary) hypertension: Secondary | ICD-10-CM | POA: Diagnosis not present

## 2020-07-12 DIAGNOSIS — I4891 Unspecified atrial fibrillation: Secondary | ICD-10-CM | POA: Diagnosis not present

## 2020-07-12 DIAGNOSIS — Z0000 Encounter for general adult medical examination without abnormal findings: Secondary | ICD-10-CM | POA: Diagnosis not present

## 2020-07-12 DIAGNOSIS — Z712 Person consulting for explanation of examination or test findings: Secondary | ICD-10-CM | POA: Diagnosis not present

## 2020-07-12 DIAGNOSIS — R7301 Impaired fasting glucose: Secondary | ICD-10-CM | POA: Diagnosis not present

## 2020-07-17 DIAGNOSIS — D509 Iron deficiency anemia, unspecified: Secondary | ICD-10-CM | POA: Diagnosis not present

## 2020-07-17 DIAGNOSIS — H34231 Retinal artery branch occlusion, right eye: Secondary | ICD-10-CM | POA: Diagnosis not present

## 2020-07-17 DIAGNOSIS — I10 Essential (primary) hypertension: Secondary | ICD-10-CM | POA: Diagnosis not present

## 2020-07-17 DIAGNOSIS — G4733 Obstructive sleep apnea (adult) (pediatric): Secondary | ICD-10-CM | POA: Diagnosis not present

## 2020-07-17 DIAGNOSIS — E782 Mixed hyperlipidemia: Secondary | ICD-10-CM | POA: Diagnosis not present

## 2020-07-17 DIAGNOSIS — R7301 Impaired fasting glucose: Secondary | ICD-10-CM | POA: Diagnosis not present

## 2020-07-17 DIAGNOSIS — I25810 Atherosclerosis of coronary artery bypass graft(s) without angina pectoris: Secondary | ICD-10-CM | POA: Diagnosis not present

## 2020-07-17 DIAGNOSIS — K922 Gastrointestinal hemorrhage, unspecified: Secondary | ICD-10-CM | POA: Diagnosis not present

## 2020-07-17 DIAGNOSIS — M1990 Unspecified osteoarthritis, unspecified site: Secondary | ICD-10-CM | POA: Diagnosis not present

## 2020-07-17 DIAGNOSIS — I4891 Unspecified atrial fibrillation: Secondary | ICD-10-CM | POA: Diagnosis not present

## 2020-07-18 DIAGNOSIS — M79671 Pain in right foot: Secondary | ICD-10-CM | POA: Diagnosis not present

## 2020-07-18 DIAGNOSIS — B353 Tinea pedis: Secondary | ICD-10-CM | POA: Diagnosis not present

## 2020-07-18 DIAGNOSIS — M79672 Pain in left foot: Secondary | ICD-10-CM | POA: Diagnosis not present

## 2020-07-18 DIAGNOSIS — M25579 Pain in unspecified ankle and joints of unspecified foot: Secondary | ICD-10-CM | POA: Diagnosis not present

## 2020-07-23 ENCOUNTER — Other Ambulatory Visit: Payer: Self-pay | Admitting: Internal Medicine

## 2020-08-04 NOTE — Progress Notes (Signed)
Cardiology Office Note  Date: 08/05/2020   ID: [name], [name] Jun 08, 1956, MRN [id number]  PCP:  [name], MD  Cardiologist:  No primary care provider on file. Electrophysiologist:  [name], MD   Chief Complaint: F/U persistent atrial fibrillation, CAD, HTN, HLD, Carotid artery disease  History of Present Illness: Martin [name] is a 65 y.o. male with a history of persistent atrial fibrillation, CAD (CABG 56/3893 complicated by CVA), HTN, HLD, Carotid artery disease.   Last visit for 19-month follow-up.    No anginal symptoms PND, orthopnea, CVA or TIA-like symptoms, claudication-like symptoms, DVT or PE-like symptoms, or lower extremity edema. He was scheduled to undergo a colonoscopy in mid September due to low Hgb. He denied any bleeding.  Significantly stressed due to taking care of his wife of 22 years with ALS with deterioration  recently.  He is the primary caregiver.  He stated he would like to go back to work but could not leave her in her current state.  Saw Dr. Lovena Le 02/27/2020 for persistent atrial fibrillation.  Dr. Lovena Le noted his atrial fib ventricular rate was low.  He asked him to stop his amiodarone.  Blood pressure was well controlled.  He was continuing his atorvastatin for dyslipidemia.  He is here for 51-month follow-up today.  He denies any significant issues.  Continuing to take care of his wife who has ALS.  He states it is stressful.  Blood pressure is well controlled today.  Heart rate is slow. His amiodarone had previously been discontinued by Dr. Lovena Le September 2021 visit.  He denies any symptoms related to slow heart rate.  States he has an inherently slow heart rate normally.  States he recently saw his primary care provider and everything was normal based on his statement.  He continues to wear compression stocking with very minor edema noted.  Had recent colonoscopy: Had one 3 mm polyp in the cecum removed with cold biopsy forceps.  Resected  and retrieved.  Had two 3 to 4 mm polyps in the rectum removed with a cold snare resected and retrieved.  Diverticulosis in sigmoid colon and descending colon Dr. Jenetta Downer.  No bleeding noted   Past Medical History:  Diagnosis Date  . Atrial fibrillation ([place])    a. dx 10/2017. b. s/p DCCV on 11/19/2017 with successful conversion to NSR but back in AFIB 3 weeks later  . CAD (coronary artery disease)    a. s/p PCI to LAD 2001. b. s/p stenting to the RCA and mid LAD December 2011, residual distal RCA disease treated medically. b. nuc 02/2013 abnormal with fixed defect seen in inferoapical, mid-inferior, and basal inferior walls, indicative of myocardial scar, no evidence of ischemia, EF 41% (Ef 55-60% by echo same time).  . Carotid artery disease ([place])    carotid Doppler course left side 5069% stenosis  . Dysrhythmia    AFib  . GERD (gastroesophageal reflux disease)   . Habitual alcohol use   . Hyperlipidemia   . Hypertension   . Low back pain   . Myocardial infarction ([place]) 2020  . OSA on CPAP   . Osteoarthritis   . Polysubstance abuse ([place])    use including marijuana and vicodin,which he obtained from the street  . PVC's (premature ventricular contractions)    status post Holter monitor normal sinus rhythm otherwise asymptomatic PVCs.  . Seasonal allergies   . Stroke [facility]) 2020    Past Surgical History:  Procedure Laterality Date  .  CARDIOVERSION N/A 11/19/2017   Procedure: CARDIOVERSION;  Surgeon: Herminio Commons, MD;  [facility];  Service: Cardiovascular;  Laterality: N/A;  . COLONOSCOPY WITH PROPOFOL N/A 03/01/2020   Procedure: COLONOSCOPY WITH PROPOFOL;  Surgeon: Harvel Quale, MD;  [facility];  Service: Gastroenterology;  Laterality: N/A;  730  . CORONARY ANGIOPLASTY WITH STENT PLACEMENT  2011  . CORONARY ARTERY BYPASS GRAFT N/A 03/10/2019   Procedure: CORONARY ARTERY BYPASS GRAFTING (CABG)X 3 Using Left Internal Mammary Artery and Right  Saphenous Vein Grafts;  Surgeon: Ivin Poot, MD;  Location: [place];  Service: Open Heart Surgery;  Laterality: N/A;  . ESOPHAGOGASTRODUODENOSCOPY (EGD) WITH PROPOFOL N/A 03/01/2020   Procedure: ESOPHAGOGASTRODUODENOSCOPY (EGD) WITH PROPOFOL;  Surgeon: Harvel Quale, MD;  [facility];  Service: Gastroenterology;  Laterality: N/A;  . LEFT HEART CATH AND CORONARY ANGIOGRAPHY N/A 03/08/2019   Procedure: LEFT HEART CATH AND CORONARY ANGIOGRAPHY;  Surgeon: Belva Crome, MD;  Location: [facility];  Service: Cardiovascular;  Laterality: N/A;  . POLYPECTOMY  03/01/2020   Procedure: POLYPECTOMY;  Surgeon: Harvel Quale, MD;  [facility];  Service: Gastroenterology;;  . TEE WITHOUT CARDIOVERSION N/A 03/10/2019   Procedure: TRANSESOPHAGEAL ECHOCARDIOGRAM (TEE);  Surgeon: Prescott Gum, Collier Salina, MD;  Location: [place];  Service: Open Heart Surgery;  Laterality: N/A;  . TONSILLECTOMY      Current Outpatient Medications  Medication Sig Dispense Refill  . acetaminophen (TYLENOL) 325 MG tablet Take 1-2 tablets (325-650 mg total) by mouth every 4 (four) hours as needed for mild pain.    Marland Kitchen allopurinol (ZYLOPRIM) 300 MG tablet Take 300 mg by mouth daily.     Marland Kitchen amLODipine (NORVASC) 5 MG tablet Take 1 tablet (5 mg total) by mouth daily. 90 tablet 1  . aspirin EC 81 MG EC tablet Take 1 tablet (81 mg total) by mouth daily. 100 tablet 1  . atorvastatin (LIPITOR) 80 MG tablet TAKE 1 TABLET BY MOUTH EVERY DAY 90 tablet 1  . Cholecalciferol (VITAMIN D-3) 1000 UNITS CAPS Take 1,000 Units by mouth every morning.     . citalopram (CELEXA) 10 MG tablet TAKE 1 TABLET BY MOUTH EVERY DAY (Patient taking differently: Take 10 mg by mouth daily.) 90 tablet 1  . colchicine 0.6 MG tablet Take 1 tablet (0.6 mg total) by mouth daily at 6 PM. 30 tablet 1  . Cyanocobalamin (VITAMIN B-12) 2500 MCG SUBL Place 2,500 mcg under the tongue every morning.     . docusate sodium (COLACE) 100 MG  capsule Take 2 capsules (200 mg total) by mouth daily. (Patient taking differently: Take 100 mg by mouth 2 (two) times daily.) 60 capsule 0  . ELIQUIS 5 MG TABS tablet TAKE 1 TABLET BY MOUTH TWICE A DAY (Patient taking differently: Take 5 mg by mouth 2 (two) times daily.) 180 tablet 2  . fluticasone (FLONASE) 50 MCG/ACT nasal spray Place 2 sprays into the nose daily.    . folic acid (FOLVITE) 1 MG tablet TAKE ONE TABLET BY MOUTH EVERY DAY (Patient taking differently: Take 1 mg by mouth daily.) 30 tablet 6  . furosemide (LASIX) 20 MG tablet Take 1 tablet (20 mg total) by mouth 2 (two) times daily. 180 tablet 3  . HYDROcodone-acetaminophen (NORCO/VICODIN) 5-325 MG tablet Take 1 tablet by mouth every 8 (eight) hours as needed.    Marland Kitchen KLOR-CON M15 15 MEQ tablet TAKE 2 TABLETS (30 MEQ TOTAL) BY MOUTH DAILY. 180 tablet 3  .  lisinopril (ZESTRIL) 10 MG tablet Take 1 tablet (10 mg total) by mouth daily. 90 tablet 3  . loratadine (CLARITIN) 10 MG tablet Take 10 mg by mouth daily.    Marland Kitchen LORazepam (ATIVAN) 0.5 MG tablet Take 0.5 mg by mouth at bedtime.     . meclizine (ANTIVERT) 32 MG tablet Take 32 mg by mouth daily as needed for dizziness.     . metoprolol tartrate (LOPRESSOR) 25 MG tablet Take 1 tablet (25 mg total) by mouth every morning. (Patient taking differently: Take 25 mg by mouth daily.) 90 tablet 3  . nitroGLYCERIN (NITROSTAT) 0.4 MG SL tablet PLACE 1 TABLET (0.4 MG TOTAL) UNDER THE TONGUE EVERY 5 (FIVE) MINUTES AS NEEDED. (Patient taking differently: Place 0.4 mg under the tongue every 5 (five) minutes as needed for chest pain.) 25 tablet 3  . pantoprazole (PROTONIX) 40 MG tablet Take 1 tablet (40 mg total) by mouth daily. (Patient taking differently: Take 40 mg by mouth at bedtime.) 30 tablet 0  . polyethylene glycol (MIRALAX / GLYCOLAX) packet Take 17 g by mouth at bedtime as needed for mild constipation.    . tamsulosin (FLOMAX) 0.4 MG CAPS capsule Take 1 capsule (0.4 mg total) by mouth 2 (two) times  daily. 60 capsule 0  . thiamine 100 MG tablet Take 1 tablet (100 mg total) by mouth daily. 30 tablet 0  . traZODone (DESYREL) 100 MG tablet TAKE 1 TABLET (100 MG TOTAL) BY MOUTH AT BEDTIME AS NEEDED FOR SLEEP. 90 tablet 1   No current facility-administered medications for this visit.   Allergies:  Penicillins   Social History: The patient  reports that he quit smoking about 21 years ago. His smoking use included cigarettes. He started smoking about 52 years ago. He has a 35.00 pack-year smoking history. He has never used smokeless tobacco. He reports previous alcohol use. He reports current drug use. Drug: Marijuana.   Family History: The patient's family history includes Healthy in his son; Heart attack in his brother and father; Other in his mother; Stroke in his mother.   ROS:  Please see the history of present illness. Otherwise, complete review of systems is positive for none.  All other systems are reviewed and negative.   Physical Exam: VS:  BP 118/68   Pulse (!) 36   Ht 5' 6.5" (1.689 m)   Wt 251 lb 3.2 oz (113.9 kg)   SpO2 96%   BMI 39.94 kg/m , BMI Body mass index is 39.94 kg/m.  Wt Readings from Last 3 Encounters:  08/05/20 251 lb 3.2 oz (113.9 kg)  03/01/20 242 lb 1 oz (109.8 kg)  02/28/20 242 lb (109.8 kg)    General: Patient appears comfortable at rest. Neck: Supple, no elevated JVP or carotid bruits, no thyromegaly. Lungs: Clear to auscultation, nonlabored breathing at rest. Extremities: Mild non pitting edema, distal pulses 2+. Skin: Warm and dry. Musculoskeletal: No kyphosis. Neuropsychiatric: Alert and oriented x3, affect grossly appropriate.  ECG:    Recent Labwork: 11/14/2019: Magnesium 2.0 02/28/2020: BUN 18; Creatinine, Ser 0.88; Potassium 3.9; Sodium 138     Component Value Date/Time   CHOL 170 03/07/2019 2358   TRIG 218 (H) 03/07/2019 2358   HDL 41 03/07/2019 2358   CHOLHDL 4.1 03/07/2019 2358   VLDL 44 (H) 03/07/2019 2358   LDLCALC 85 03/07/2019  2358    Other Studies Reviewed Today:  CABG 03/10/2019 Procedure (s):  1. Coronary artery bypass grafting x3 (left internal mammary artery to left anterior  descending, saphenous vein graft to ramus intermedius, saphenous vein graft to posterior descending). 2. Endoscopic harvest of right leg greater saphenous vein by Dr. Prescott Gum on 03/10/2019.   Echo 03/08/19: IMPRESSIONS 1. Left ventricular ejection fraction, by visual estimation, is 55 to 60%. The left ventricle has normal function. Normal left ventricular size. There is mildly increased left ventricular hypertrophy. Basal inferior akinesis. 2. Left ventricular diastolic Doppler parameters are consistent with impaired relaxation pattern of LV diastolic filling. 3. Global right ventricle has normal systolic function.The right ventricular size is normal. No increase in right ventricular wall thickness. 4. The aortic valve is tricuspid Aortic valve regurgitation is trivial by color flow Doppler. Mild aortic valve stenosis. Mean gradient 10 mmHg. 5. There is mild dilatation of the ascending aorta measuring 42 mm. 6. Left atrial size was moderately dilated. 7. Right atrial size was mildly dilated. 8. The tricuspid valve is normal in structure. Tricuspid valve regurgitation was not visualized by color flow Doppler. 9. The mitral valve is normal in structure. No evidence of mitral valve regurgitation. No evidence of mitral stenosis. 10. TR signal is inadequate for assessing pulmonary artery systolic pressure. 11. The inferior vena cava is normal in size with greater than 50% respiratory variability, suggesting right atrial pressure of 3 mmHg.  Carotid Dopplers 03/09/2019:1 to 39% ICA stenosis bilaterally.  Left heart cath 03/08/2019:  Severe three-vessel coronary calcification  Patent left main  Eccentric ostial 50 to 75% LAD with mid vessel 40 to 50% narrowing. Mid to distal LAD stent is widely patent.  Ramus  intermedius contains proximal 90% stenosis.  Circumflex contains 99% proximal/ostial stenosis.  RCA is heavily calcified and stented. The artery is totally occluded in the proximal to mid segment. Left-to-right collaterals are noted.  Mildly reduced LV systolic function with EF estimated to be 40%. LVEDP is 28 mmHg. Findings compatible with acute on chronic combined    Assessment and Plan:   1. Persistent atrial fibrillation (HCC) Heart rate today 36.  Patient denies any symptoms.  Amiodarone DC'd by EP at recent visit due to slow heart rate.  Continue Eliquis 5 mg p.o. twice daily.  2. Coronary artery disease of bypass graft of native heart with stable angina pectoris (HCC) No anginal or exertional symptoms.  Continue aspirin 81 mg po daily.  Nitroglycerin sublingual 0.4 mg as needed  3. Essential hypertension Blood pressure well controlled today at 118/68 continue Amlodipine 5 mg po daily. Continue Lisinopril 10 mg daily.    4. Hyperlipidemia LDL goal <70 Last lipid profile: TC 104, TG 51, HDL 48, LDL 44 on 12/21/2019.  Continue high-dose atorvastatin 80 mg a day.   5. Left-sided carotid artery disease, unspecified type ([place]) Carotid artery duplex study 03/28/2019 : bilateral 1 to 39% ICA disease.   Medication Adjustments/Labs and Tests Ordered: Current medicines are reviewed at length with the patient today.  Concerns regarding medicines are outlined above.   Disposition: Follow-up with Dr. Harl Bowie or APP 6 months Signed, Levell July, NP  08/05/2020 11:38 AM    [facility] [address], [place], [place] [address] Phone: [phone]; Fax: [phone]

## 2020-08-05 ENCOUNTER — Ambulatory Visit (INDEPENDENT_AMBULATORY_CARE_PROVIDER_SITE_OTHER): Payer: Medicare Other | Admitting: Family Medicine

## 2020-08-05 ENCOUNTER — Encounter: Payer: Self-pay | Admitting: Family Medicine

## 2020-08-05 VITALS — BP 118/68 | HR 36 | Ht 66.5 in | Wt 251.2 lb

## 2020-08-05 DIAGNOSIS — I2510 Atherosclerotic heart disease of native coronary artery without angina pectoris: Secondary | ICD-10-CM | POA: Diagnosis not present

## 2020-08-05 DIAGNOSIS — I10 Essential (primary) hypertension: Secondary | ICD-10-CM

## 2020-08-05 DIAGNOSIS — E782 Mixed hyperlipidemia: Secondary | ICD-10-CM

## 2020-08-05 DIAGNOSIS — D509 Iron deficiency anemia, unspecified: Secondary | ICD-10-CM | POA: Diagnosis not present

## 2020-08-05 DIAGNOSIS — I4819 Other persistent atrial fibrillation: Secondary | ICD-10-CM | POA: Diagnosis not present

## 2020-08-05 DIAGNOSIS — H34231 Retinal artery branch occlusion, right eye: Secondary | ICD-10-CM | POA: Diagnosis not present

## 2020-08-05 DIAGNOSIS — R7301 Impaired fasting glucose: Secondary | ICD-10-CM | POA: Diagnosis not present

## 2020-08-05 DIAGNOSIS — I25810 Atherosclerosis of coronary artery bypass graft(s) without angina pectoris: Secondary | ICD-10-CM | POA: Diagnosis not present

## 2020-08-05 DIAGNOSIS — K922 Gastrointestinal hemorrhage, unspecified: Secondary | ICD-10-CM | POA: Diagnosis not present

## 2020-08-05 NOTE — Patient Instructions (Signed)
Medication Instructions:  Continue all current medications.   Labwork: none  Testing/Procedures: none  Follow-Up: 6 months   Any Other Special Instructions Will Be Listed Below (If Applicable).   If you need a refill on your cardiac medications before your next appointment, please call your pharmacy.  

## 2020-08-06 ENCOUNTER — Other Ambulatory Visit: Payer: Self-pay | Admitting: Family Medicine

## 2020-08-07 DIAGNOSIS — G4733 Obstructive sleep apnea (adult) (pediatric): Secondary | ICD-10-CM | POA: Diagnosis not present

## 2020-08-12 ENCOUNTER — Telehealth: Payer: Self-pay | Admitting: Family Medicine

## 2020-08-12 MED ORDER — ATORVASTATIN CALCIUM 80 MG PO TABS
80.0000 mg | ORAL_TABLET | Freq: Every day | ORAL | 1 refills | Status: DC
Start: 2020-08-12 — End: 2021-01-09

## 2020-08-12 NOTE — Telephone Encounter (Signed)
Medication sent to pharmacy  

## 2020-08-12 NOTE — Telephone Encounter (Signed)
*  STAT* If patient is at the pharmacy, call can be transferred to refill team.   1. Which medications need to be refilled? (please list name of each medication and dose if known)   metoprolol tartrate (LOPRESSOR) 25 MG tablet   2. Which pharmacy/location (including street and city if local pharmacy) is medication to be sent to? [facility] [place]  3. Do they need a 30 day or 90 day supply? [place]

## 2020-08-13 ENCOUNTER — Telehealth: Payer: Self-pay | Admitting: Family Medicine

## 2020-08-13 MED ORDER — METOPROLOL TARTRATE 25 MG PO TABS
25.0000 mg | ORAL_TABLET | ORAL | 3 refills | Status: DC
Start: 2020-08-13 — End: 2021-01-31

## 2020-08-13 NOTE — Telephone Encounter (Signed)
*  STAT* If patient is at the pharmacy, call can be transferred to refill team.   1. Which medications need to be refilled? (please list name of each medication and dose if known) metoprolol tartrate (LOPRESSOR) 25 MG tablet  2. Which pharmacy/location (including street and city if local pharmacy) is [facility] [place]   3. Do they need a 30 day or 90 day supply? [place]

## 2020-09-04 DIAGNOSIS — D509 Iron deficiency anemia, unspecified: Secondary | ICD-10-CM | POA: Diagnosis not present

## 2020-09-04 DIAGNOSIS — I25810 Atherosclerosis of coronary artery bypass graft(s) without angina pectoris: Secondary | ICD-10-CM | POA: Diagnosis not present

## 2020-09-04 DIAGNOSIS — K922 Gastrointestinal hemorrhage, unspecified: Secondary | ICD-10-CM | POA: Diagnosis not present

## 2020-09-04 DIAGNOSIS — I10 Essential (primary) hypertension: Secondary | ICD-10-CM | POA: Diagnosis not present

## 2020-09-04 DIAGNOSIS — H34231 Retinal artery branch occlusion, right eye: Secondary | ICD-10-CM | POA: Diagnosis not present

## 2020-09-04 DIAGNOSIS — R7301 Impaired fasting glucose: Secondary | ICD-10-CM | POA: Diagnosis not present

## 2020-09-04 DIAGNOSIS — G4733 Obstructive sleep apnea (adult) (pediatric): Secondary | ICD-10-CM | POA: Diagnosis not present

## 2020-09-04 DIAGNOSIS — E782 Mixed hyperlipidemia: Secondary | ICD-10-CM | POA: Diagnosis not present

## 2020-09-04 DIAGNOSIS — I4891 Unspecified atrial fibrillation: Secondary | ICD-10-CM | POA: Diagnosis not present

## 2020-09-08 ENCOUNTER — Other Ambulatory Visit: Payer: Self-pay | Admitting: Family Medicine

## 2020-10-15 DIAGNOSIS — R296 Repeated falls: Secondary | ICD-10-CM | POA: Diagnosis not present

## 2020-10-15 DIAGNOSIS — M5451 Vertebrogenic low back pain: Secondary | ICD-10-CM | POA: Diagnosis not present

## 2020-10-17 DIAGNOSIS — G5750 Tarsal tunnel syndrome, unspecified lower limb: Secondary | ICD-10-CM | POA: Diagnosis not present

## 2020-10-17 DIAGNOSIS — M1990 Unspecified osteoarthritis, unspecified site: Secondary | ICD-10-CM | POA: Diagnosis not present

## 2020-10-17 DIAGNOSIS — M79672 Pain in left foot: Secondary | ICD-10-CM | POA: Diagnosis not present

## 2020-10-25 DIAGNOSIS — I10 Essential (primary) hypertension: Secondary | ICD-10-CM | POA: Diagnosis not present

## 2020-10-25 DIAGNOSIS — M1990 Unspecified osteoarthritis, unspecified site: Secondary | ICD-10-CM | POA: Diagnosis not present

## 2020-11-08 DIAGNOSIS — M1990 Unspecified osteoarthritis, unspecified site: Secondary | ICD-10-CM | POA: Diagnosis not present

## 2020-11-08 DIAGNOSIS — I10 Essential (primary) hypertension: Secondary | ICD-10-CM | POA: Diagnosis not present

## 2020-11-13 DIAGNOSIS — G4733 Obstructive sleep apnea (adult) (pediatric): Secondary | ICD-10-CM | POA: Diagnosis not present

## 2020-12-12 ENCOUNTER — Ambulatory Visit: Payer: Medicare Other | Admitting: Pulmonary Disease

## 2020-12-12 ENCOUNTER — Encounter: Payer: Self-pay | Admitting: Pulmonary Disease

## 2020-12-12 ENCOUNTER — Other Ambulatory Visit: Payer: Self-pay

## 2020-12-12 DIAGNOSIS — I5032 Chronic diastolic (congestive) heart failure: Secondary | ICD-10-CM

## 2020-12-12 DIAGNOSIS — Z9989 Dependence on other enabling machines and devices: Secondary | ICD-10-CM | POA: Diagnosis not present

## 2020-12-12 DIAGNOSIS — G4733 Obstructive sleep apnea (adult) (pediatric): Secondary | ICD-10-CM

## 2020-12-12 NOTE — Assessment & Plan Note (Signed)
Due to his cardiovascular comorbidities of testing heart failure and atrial fibrillation, controlling OSA is very important

## 2020-12-12 NOTE — Assessment & Plan Note (Signed)
CPAP download was reviewed which shows excellent compliance with large leak and residual AHI of 11/hour and 12 cm.  Since he has lost weight in the interim, I do not feel that this is related to uncontrolled OSA.  This is more likely due to the leak I offered him mask fitting session and our sleep center.  He will make the appointment and we will recheck download after he has changed out masks Overall CPAP is certainly helped his daytime somnolence and fatigue and he is very compliant  Weight loss encouraged, compliance with goal of at least 4-6 hrs every night is the expectation. Advised against medications with sedative side effects Cautioned against driving when sleepy - understanding that sleepiness will vary on a day to day basis

## 2020-12-12 NOTE — Progress Notes (Signed)
Subjective:    Patient ID: Martin [name], male    DOB: April 16, 1956, 65 y.o.   MRN: [id number]  HPI  65 yo obese man with CAD for FU of obstructive sleep apnea. He had a  sleep study in 2001 when he had a heart attack and has been maintained on CPAP since then.   PMH - CABG 2020 ,CVA Wife has ALS , on hospice, he is care giver  Chief Complaint  Patient presents with   Follow-up    Patient states that he is doing good but states that he needs new supplies but needs someone to come to his house and [place].     Last OV 05/2018 He got a new CPAP in 2017 12 cm Machine is working well but he has a large leak.  This is caused interruptions in his sleep and he does not feel as well rested when he wakes up in the mornings. He is also the primary caregiver for his wife who seems to have terminal ALS on hospice He has lost 20 pounds He needs new CPAP supplies but is unable to go to the [place] office for DME as they requested for mask fitting because he cannot leave his wife alone for that long  In 2020 he had a stroke and underwent CABG  Significant tests/ events reviewed  PSG 04/2016 at [facility] showed AHI of 29/hour with lowest desaturation of 86%   Past Medical History:  Diagnosis Date   Atrial fibrillation ([place])    a. dx 10/2017. b. s/p DCCV on 11/19/2017 with successful conversion to NSR but back in AFIB 3 weeks later   CAD (coronary artery disease)    a. s/p PCI to LAD 2001. b. s/p stenting to the RCA and mid LAD December 2011, residual distal RCA disease treated medically. b. nuc 02/2013 abnormal with fixed defect seen in inferoapical, mid-inferior, and basal inferior walls, indicative of myocardial scar, no evidence of ischemia, EF 41% (Ef 55-60% by echo same time).   Carotid artery disease (HCC)    carotid Doppler course left side 5069% stenosis   Dysrhythmia    AFib   GERD (gastroesophageal reflux disease)    Habitual alcohol use    Hyperlipidemia     Hypertension    Low back pain    Myocardial infarction ([place]) 2020   OSA on CPAP    Osteoarthritis    Polysubstance abuse ([place])    use including marijuana and vicodin,which he obtained from the street   PVC's (premature ventricular contractions)    status post Holter monitor normal sinus rhythm otherwise asymptomatic PVCs.   Seasonal allergies    Stroke [facility]) 2020     Review of Systems neg for any significant sore throat, dysphagia, itching, sneezing, nasal congestion or excess/ purulent secretions, fever, chills, sweats, unintended wt loss, pleuritic or exertional cp, hempoptysis, orthopnea pnd or change in chronic leg swelling. Also denies presyncope, palpitations, heartburn, abdominal pain, nausea, vomiting, diarrhea or change in bowel or urinary habits, dysuria,hematuria, rash, arthralgias, visual complaints, headache, numbness weakness or ataxia.     Objective:   Physical Exam  Gen. Pleasant, obese, in no distress ENT - no lesions, no post nasal drip Neck: No JVD, no thyromegaly, no carotid bruits Lungs: no use of accessory muscles, no dullness to percussion, decreased without rales or rhonchi  Cardiovascular: Rhythm regular, heart sounds  normal, no murmurs or gallops, no peripheral edema Musculoskeletal: No deformities, no cyanosis or clubbing , no tremors  Assessment & Plan:

## 2020-12-12 NOTE — Patient Instructions (Signed)
  Call sleep center at [phone] for mask fitting session CPAP supplies will be renewed x 1 year

## 2020-12-12 NOTE — Addendum Note (Signed)
Addended by: Lia Foyer R on: 12/12/2020 12:38 PM   Modules accepted: Orders

## 2020-12-16 ENCOUNTER — Other Ambulatory Visit: Payer: Self-pay

## 2020-12-16 MED ORDER — NITROGLYCERIN 0.4 MG SL SUBL: 0.4000 mg | SUBLINGUAL_TABLET | SUBLINGUAL | 3 refills | Status: DC | PRN

## 2020-12-17 DIAGNOSIS — H52223 Regular astigmatism, bilateral: Secondary | ICD-10-CM | POA: Diagnosis not present

## 2020-12-17 DIAGNOSIS — H5213 Myopia, bilateral: Secondary | ICD-10-CM | POA: Diagnosis not present

## 2020-12-17 DIAGNOSIS — H53489 Generalized contraction of visual field, unspecified eye: Secondary | ICD-10-CM | POA: Diagnosis not present

## 2020-12-17 DIAGNOSIS — H524 Presbyopia: Secondary | ICD-10-CM | POA: Diagnosis not present

## 2020-12-17 DIAGNOSIS — H2513 Age-related nuclear cataract, bilateral: Secondary | ICD-10-CM | POA: Diagnosis not present

## 2020-12-24 ENCOUNTER — Other Ambulatory Visit: Payer: Self-pay

## 2020-12-24 ENCOUNTER — Ambulatory Visit (HOSPITAL_BASED_OUTPATIENT_CLINIC_OR_DEPARTMENT_OTHER): Payer: Medicare Other | Attending: Pulmonary Disease | Admitting: Pulmonary Disease

## 2020-12-24 DIAGNOSIS — G4733 Obstructive sleep apnea (adult) (pediatric): Secondary | ICD-10-CM

## 2021-01-05 DIAGNOSIS — I10 Essential (primary) hypertension: Secondary | ICD-10-CM | POA: Diagnosis not present

## 2021-01-05 DIAGNOSIS — M1990 Unspecified osteoarthritis, unspecified site: Secondary | ICD-10-CM | POA: Diagnosis not present

## 2021-01-07 DIAGNOSIS — E785 Hyperlipidemia, unspecified: Secondary | ICD-10-CM | POA: Diagnosis not present

## 2021-01-07 DIAGNOSIS — H47011 Ischemic optic neuropathy, right eye: Secondary | ICD-10-CM | POA: Diagnosis not present

## 2021-01-07 DIAGNOSIS — R7301 Impaired fasting glucose: Secondary | ICD-10-CM | POA: Diagnosis not present

## 2021-01-09 ENCOUNTER — Other Ambulatory Visit: Payer: Self-pay | Admitting: Family Medicine

## 2021-01-09 ENCOUNTER — Other Ambulatory Visit: Payer: Self-pay | Admitting: Student

## 2021-01-10 DIAGNOSIS — G4733 Obstructive sleep apnea (adult) (pediatric): Secondary | ICD-10-CM | POA: Diagnosis not present

## 2021-01-16 DIAGNOSIS — M1990 Unspecified osteoarthritis, unspecified site: Secondary | ICD-10-CM | POA: Diagnosis not present

## 2021-01-16 DIAGNOSIS — M79672 Pain in left foot: Secondary | ICD-10-CM | POA: Diagnosis not present

## 2021-01-16 DIAGNOSIS — M25579 Pain in unspecified ankle and joints of unspecified foot: Secondary | ICD-10-CM | POA: Diagnosis not present

## 2021-01-24 IMAGING — CR DG CHEST 1V
1 series · 1 of 1 positions shown · non-contrast
Comparison: 03/07/2019

CLINICAL DATA: Status post CABG, rule out pneumo.

EXAM:
CHEST  1 VIEW

[AP]
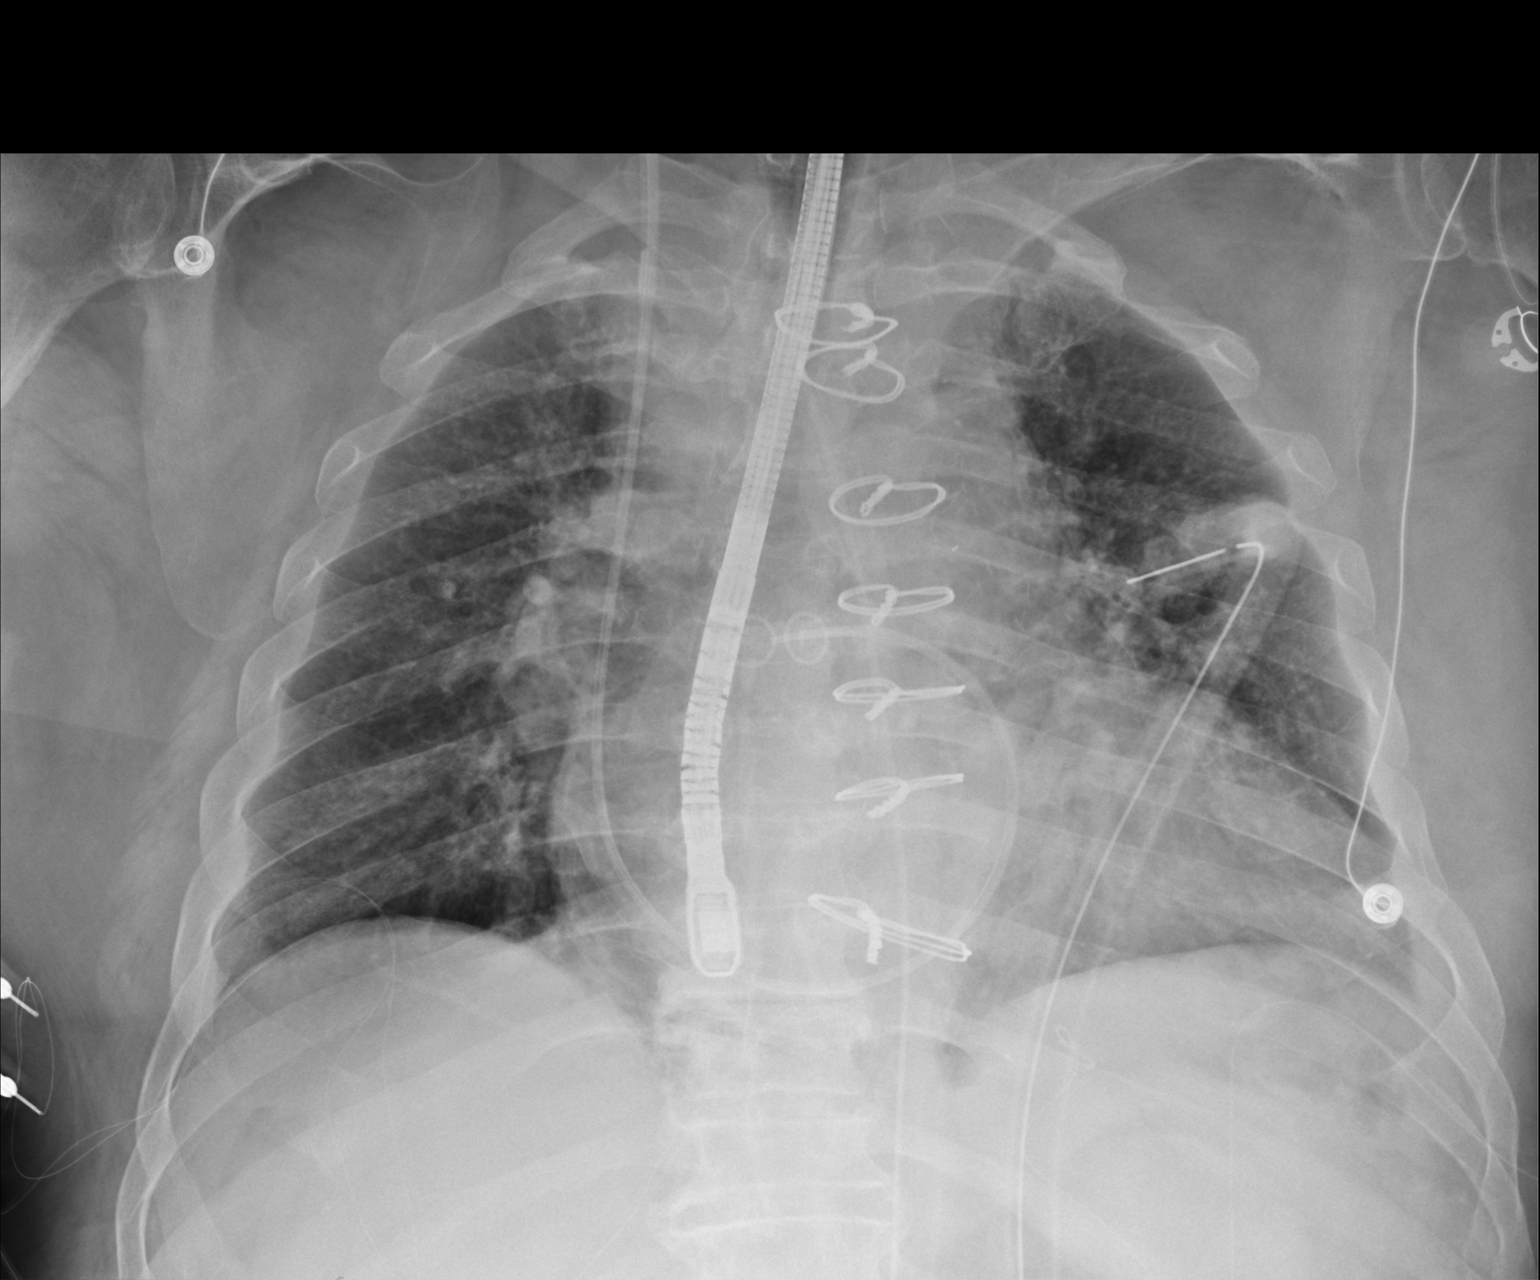

[1 of 1 positions shown; findings below may reference images not displayed]

FINDINGS: Now status post median sternotomy and CABG. Swan-Ganz catheter in
proximal right pulmonary artery. A transesophageal echocardiographic
pro passes to the level of the T10 vertebral body. Seven intact
sternal wires are noted.

Left-sided chest tube in place without signs of pneumothorax on
supine radiograph.

Cardiomediastinal contours with mild widening superiorly likely
secondary to postoperative change otherwise unchanged accounting for
low lung volumes. There is a single tube in the midline projecting
over sternal wires extending to the level of the sternal manubrium.
This may represent mediastinal drain.
IMPRESSION: 1. Status post CABG with left chest tube without visible
pneumothorax.
2. Swan-Ganz catheter and transesophageal echocardiographic probe in
place.
3. Midline drainage tubing projects over the mediastinum,
potentially mediastinal drain, clinical correlation suggested.

## 2021-01-25 IMAGING — DX DG CHEST 1V PORT
1 series · 1 of 1 positions shown · non-contrast
Comparison: 03/10/2019

CLINICAL DATA: CABG x3 03/10/2019.

EXAM:
PORTABLE CHEST 1 VIEW

[chest ap]
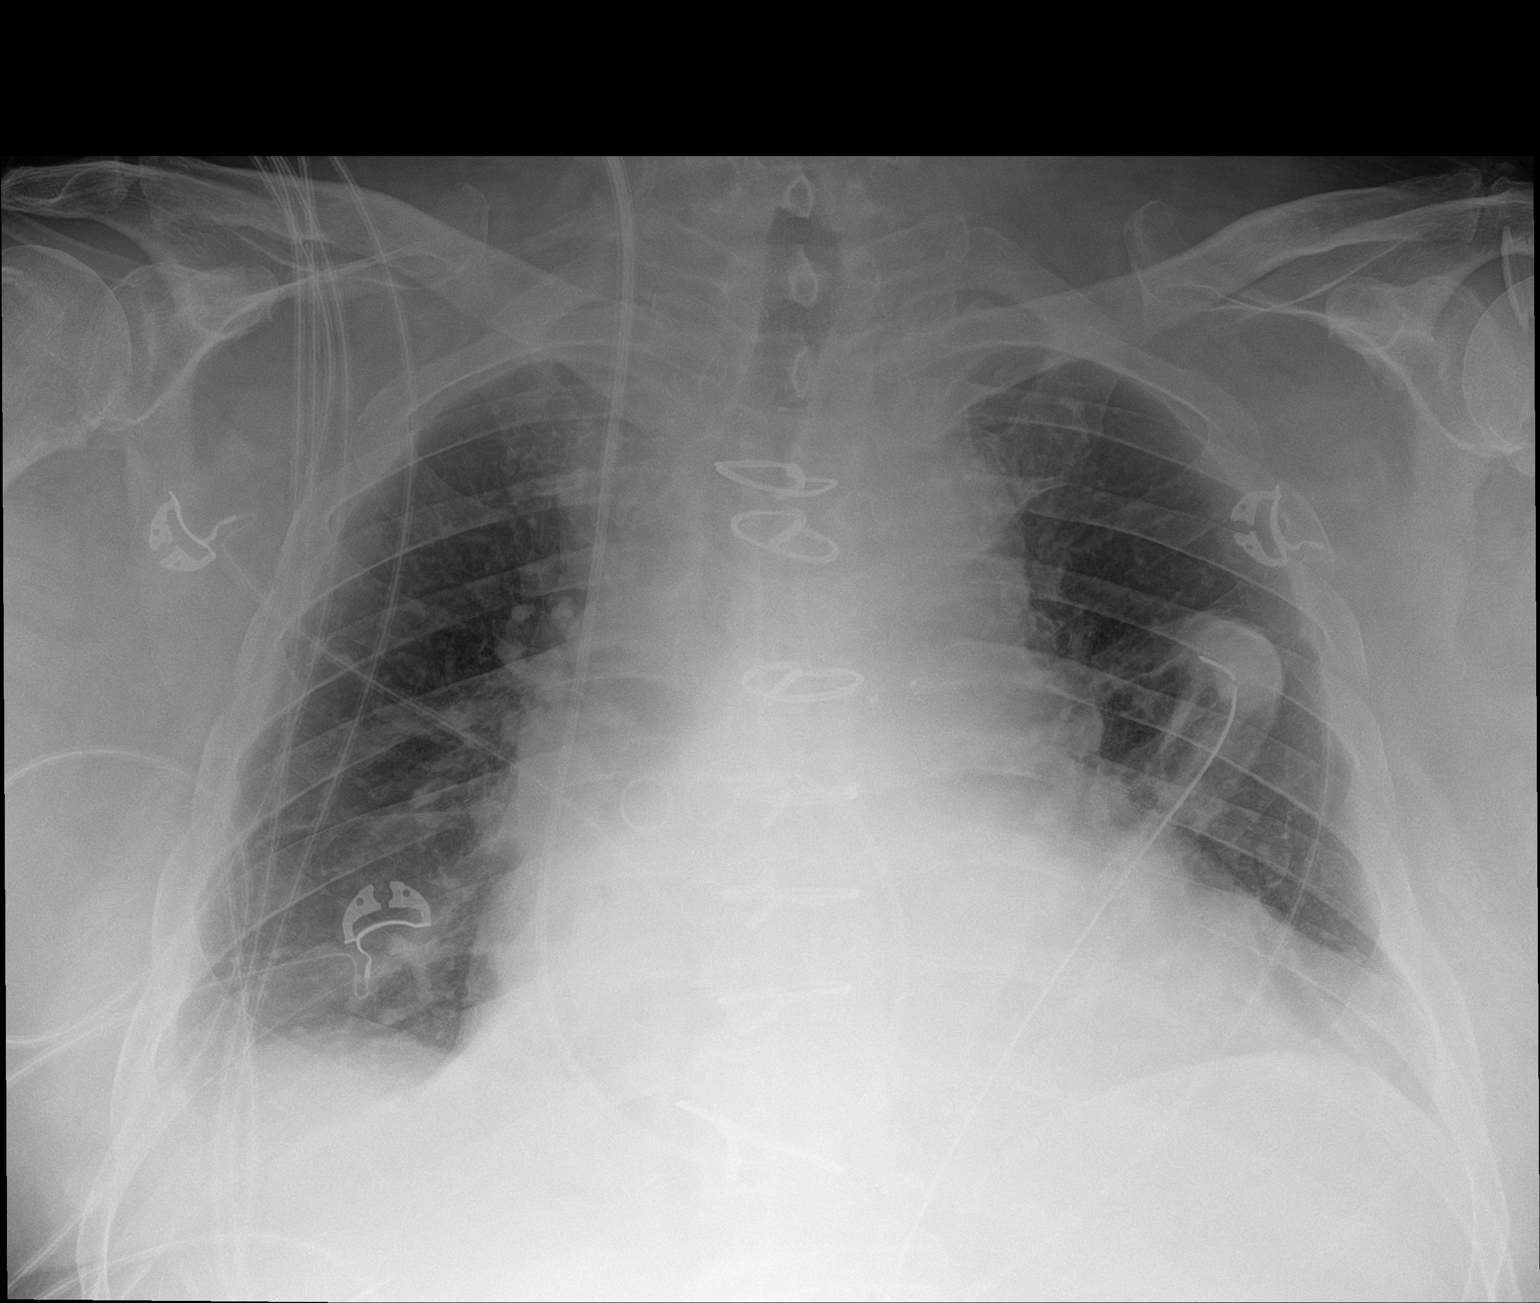

[1 of 1 positions shown; findings below may reference images not displayed]

FINDINGS: Right IJ Swan-Ganz catheter unchanged with tip over the right main
pulmonary artery. Left-sided chest tube unchanged. Mediastinal drain
unchanged.

Lungs are adequately inflated with very minimal bibasilar density
likely small amount of bilateral pleural fluid/atelectasis. Mild
stable cardiomegaly. Remainder of the exam is unchanged.
IMPRESSION: Suggestion of small amount of bilateral pleural fluid/atelectasis.
Stable cardiomegaly.

Tubes and lines as described.

## 2021-01-26 IMAGING — DX DG CHEST 1V PORT
1 series · 1 of 1 positions shown · non-contrast
Comparison: 03/11/2019

CLINICAL DATA: Status post surgery.

EXAM:
PORTABLE CHEST 1 VIEW

[chest]
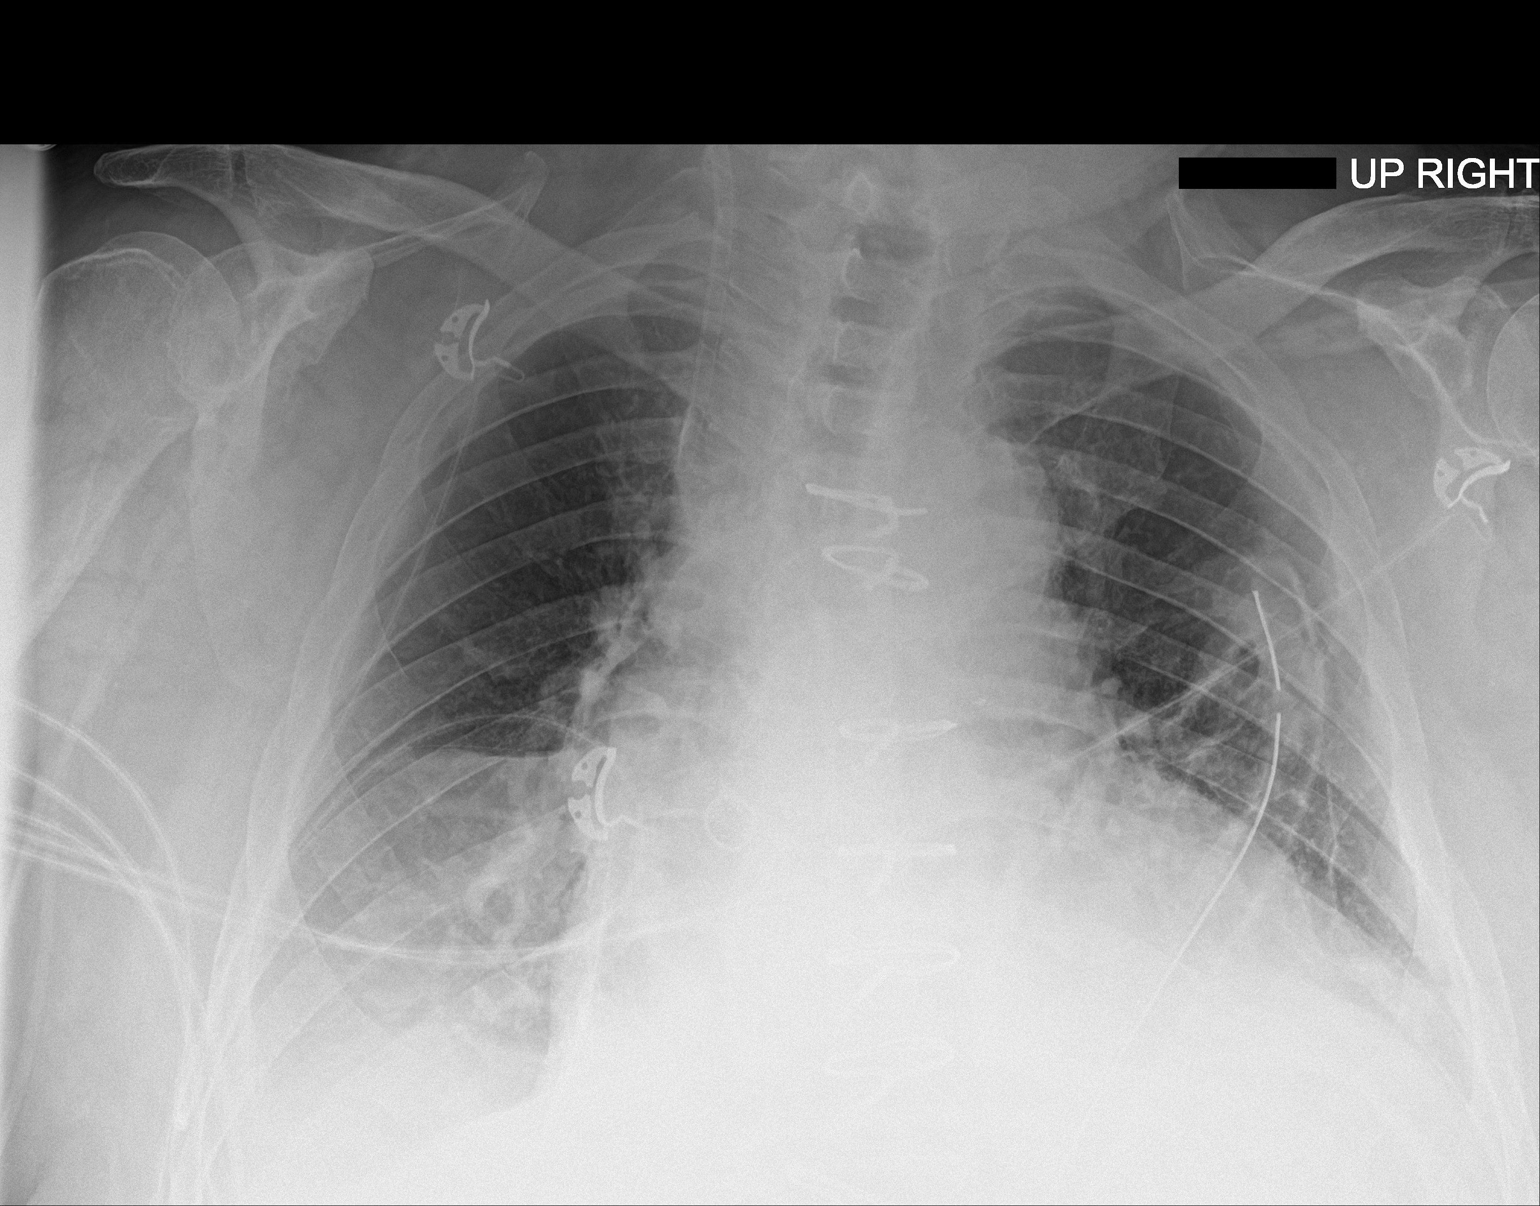

[1 of 1 positions shown; findings below may reference images not displayed]

FINDINGS: Interval removal of the Swan-Ganz catheter. Right jugular central
venous sheath remains in place. Left-sided chest tube in unchanged
position. No left pneumothorax. Mild left basilar atelectasis. Right
basilar airspace disease likely reflecting atelectasis with a small
right pleural effusion. No right pneumothorax. Stable
cardiomediastinal silhouette. Recent CABG.
IMPRESSION: 1. Left-sided chest tube in unchanged position. No left
pneumothorax. Mild left basilar atelectasis.
2. Right basilar atelectasis with a small right pleural effusion.

## 2021-01-27 IMAGING — CT CT HEAD W/O CM
4 series · 16 of 47 positions shown, 18 images · non-contrast
Comparison: Brain MRI 04/18/2018

CLINICAL DATA: Generalized muscle weakness.

Left-sided weakness
EXAM:
CT HEAD WITHOUT CONTRAST
TECHNIQUE: Contiguous axial images were obtained from the base of the skull
through the vertex without intravenous contrast.

[Series 3: head wo · axial · 0.52mm/px · z∈[+1278,+1412]mm · 7 of 37 slices shown, 9 images]
[im 5/37  brain]
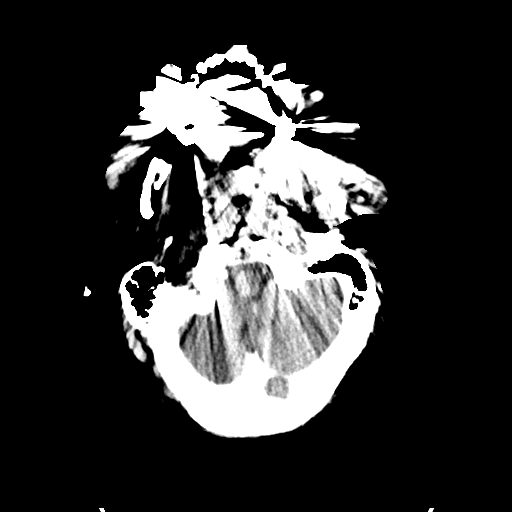
[im 5/37  bone]
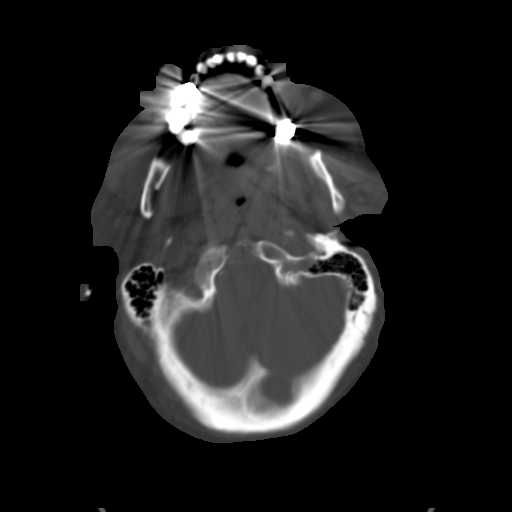
[im 10/37  brain]
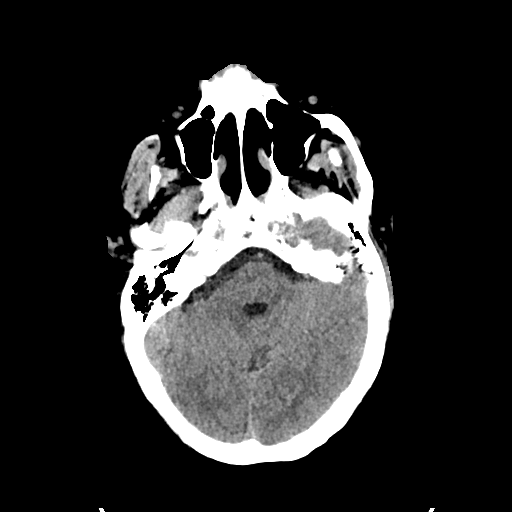
[im 14/37  brain]
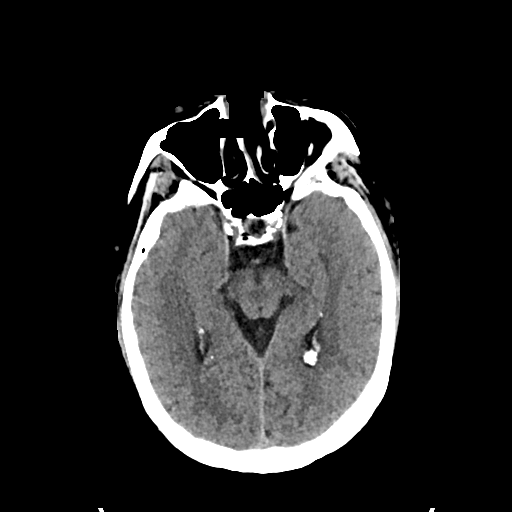
[im 19/37  brain]
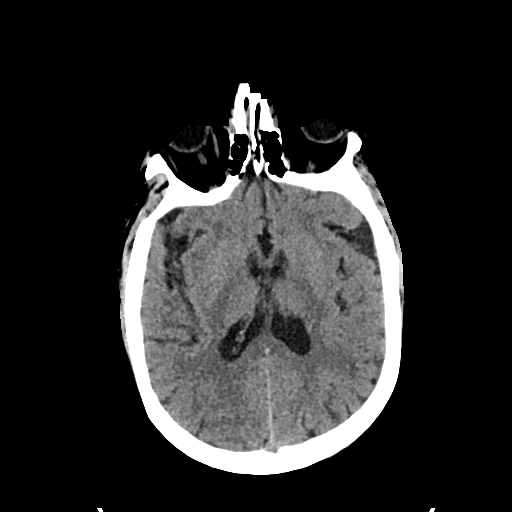
[im 23/37  brain]
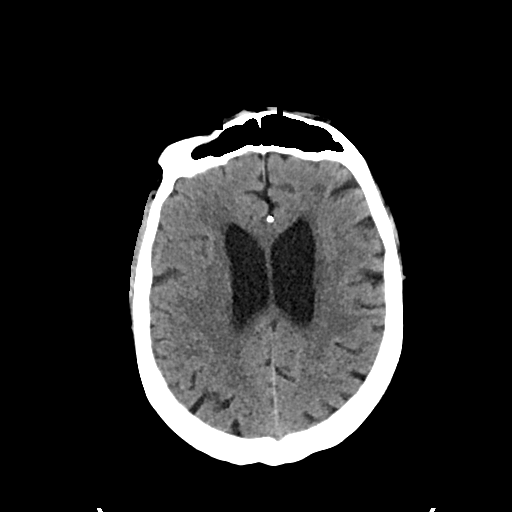
[im 23/37  bone]
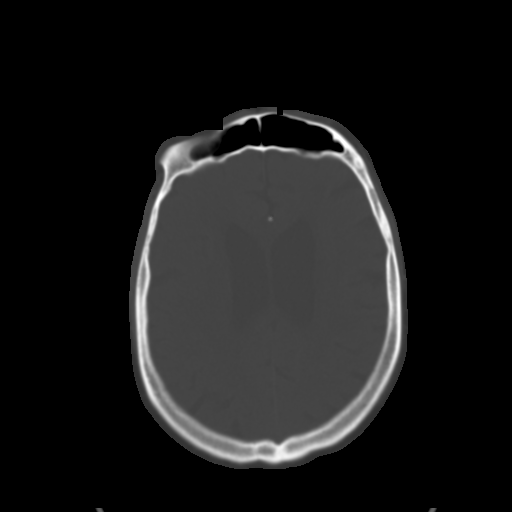
[im 28/37  brain]
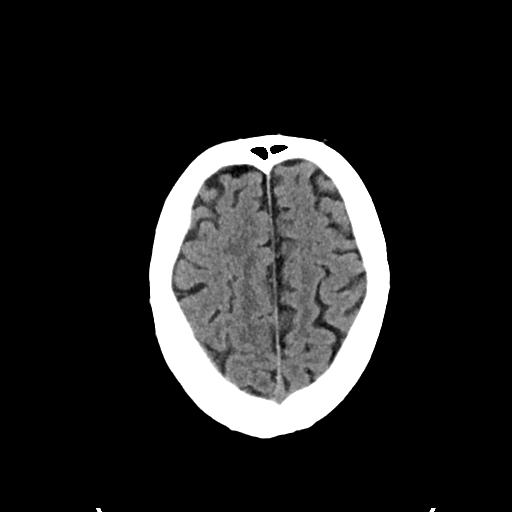
[im 32/37  brain]
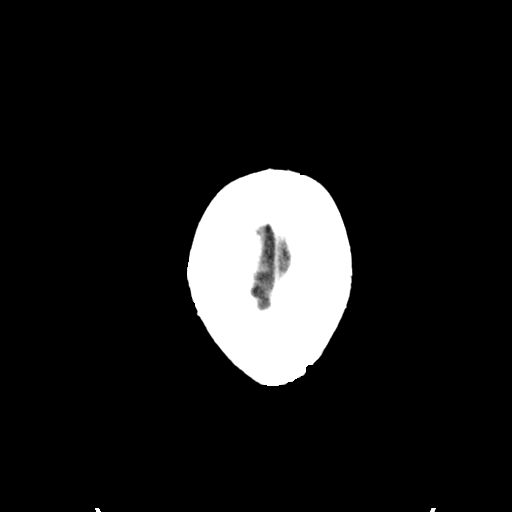

[Series 4: head bone · axial · 0.52mm/px · z∈[+1276,+1312]mm · 3 of 91 slices shown]
[im 10/91  bone]
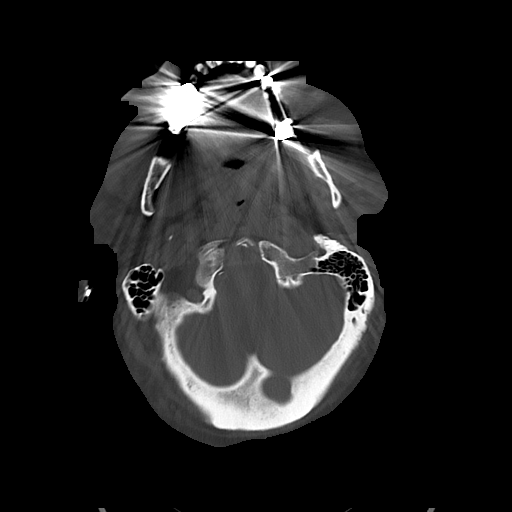
[im 19/91  bone]
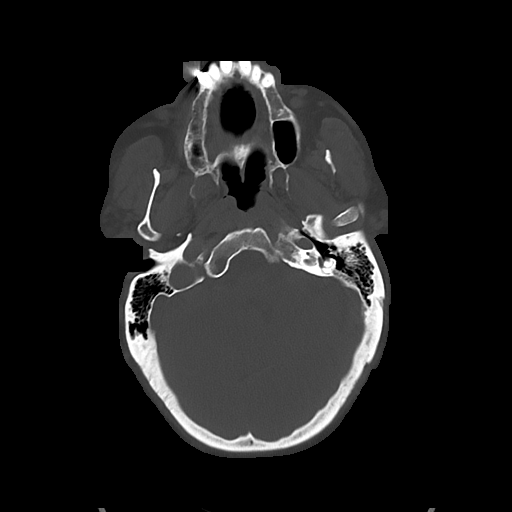
[im 28/91  bone]
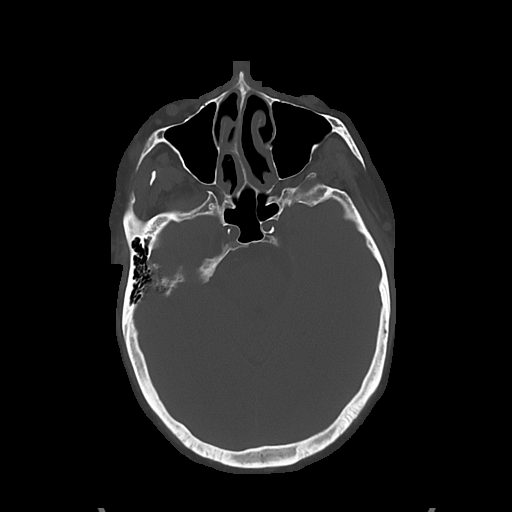

[Series 5: cor soft · coronal · 0.35mm/px · 3 of 79 slices shown]
[im 31/79  brain]
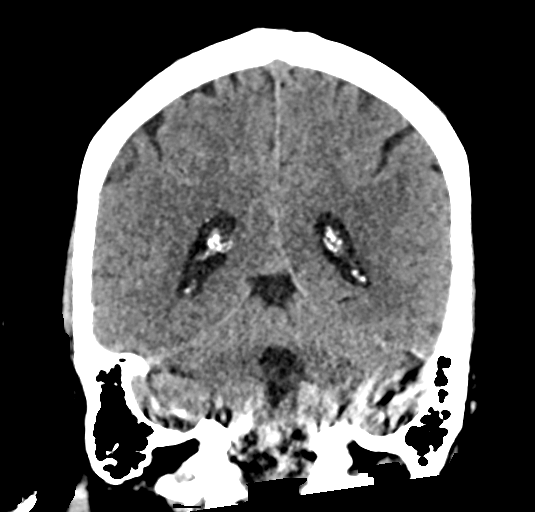
[im 37/79  brain]
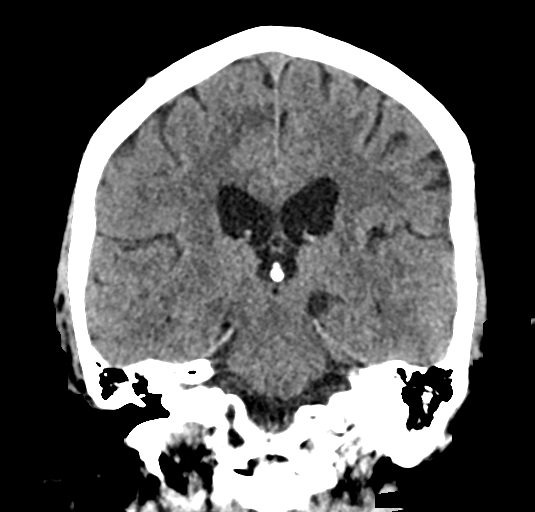
[im 42/79  brain]
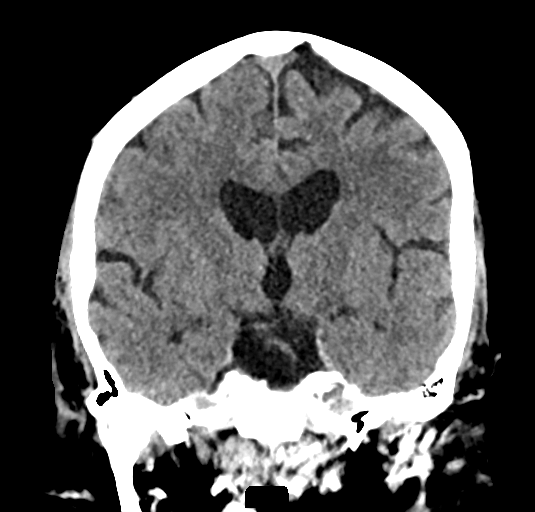

[Series 6: sag soft · sagittal · 0.41mm/px · 3 of 62 slices shown]
[im 21/62  brain]
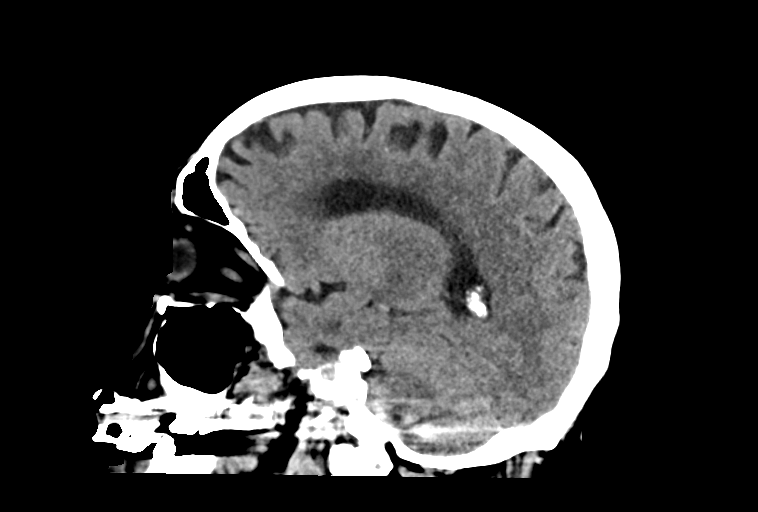
[im 31/62  brain]
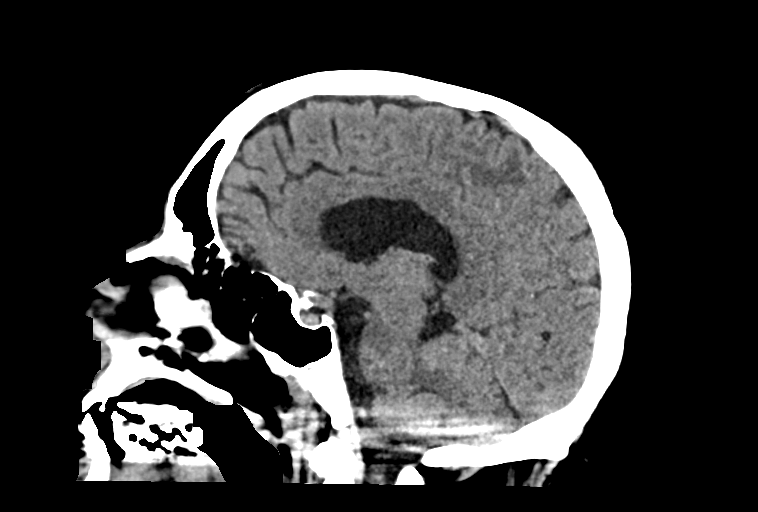
[im 41/62  brain]
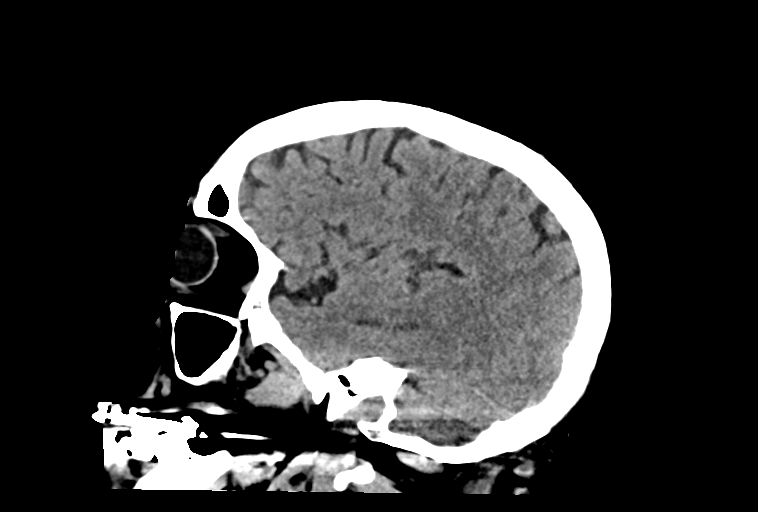

[16 of 47 positions shown; findings below may reference images not displayed]

FINDINGS: Brain: Small area of cytotoxic appearance in the parasagittal and
posterior right frontal region, distal ACA territory. No hemorrhage,
hydrocephalus, or masslike finding. Low-density in the cerebral
white matter that is fairly symmetric and likely chronic
microvascular ischemia but progressed from MR. [place] cortically
based calcification along the right hemisphere, nonspecific in
isolation.

Vascular: Atherosclerotic calcification.

Skull: Negative.

Sinuses/Orbits: Negative.

Other: These results were communicated to Dr. Retana at [DATE] [place]
03/13/2019by text page via the [employer] messaging system.
IMPRESSION: 1. Small acute cortical infarct in the distal right ACA territory.
2. Cerebral white matter low-density that is mild and symmetric but
may have progressed from 1305, question right watershed white matter
infarcts as well.

## 2021-01-30 ENCOUNTER — Encounter: Payer: Self-pay | Admitting: Cardiology

## 2021-01-30 NOTE — Progress Notes (Signed)
Cardiology Office Note  Date: 01/31/2021   ID: Martin [name], [place] [birth year], MRN [id number]  PCP:  [name], MD  Cardiologist:  Rozann Lesches, MD Electrophysiologist:  [name], MD   Chief Complaint  Patient presents with   Cardiac follow-up     History of Present Illness: Martin [name] is a 65 y.o. male former patient of Dr. Bronson Ing now presenting to establish follow-up with me.  I reviewed his records and updated the chart.  He was last seen in February by Mr. Leonides Sake NP.  He does not report any obvious angina symptoms, describes NYHA class II-III dyspnea depending on level of activity.  He is primary caregiver for his wife who has had declining health over the last 2 years due to [place].  He does go to the maintenance cardiac rehabilitation program 3 days a week.  I note that he has been significantly bradycardic on the last several visits.  Amiodarone discontinued but he continues on low-dose beta-blocker.  He does not describe any dizziness or syncope.  Question whether this contributes to any is of his shortness of breath however.  I personally reviewed his ECG today which shows slow atrial fibrillation at 46 bpm with PVCs and nonspecific ST-T changes, also IVCD.  Carotid Dopplers in October 2020 revealed 1-39% bilateral ICA stenoses.  He reports chronic leg swelling.  His last echocardiogram was in 2020 at which point LVEF was in the range of 55 to 60%.  He did have a moderate posterior pericardial effusion at that time as well.  I reviewed his medications which are noted below.  Past Medical History:  Diagnosis Date   Atrial fibrillation ([place])    a. dx 10/2017. b. s/p DCCV on 11/19/2017 with successful conversion to NSR but back in AFIB 3 weeks later   CAD (coronary artery disease)    a. s/p PCI to LAD 2001. b. s/p stenting to the RCA and mid LAD December 2011, residual distal RCA disease treated medically. c.  Multivessel disease status post CABG October  2020   Carotid artery disease ([place])    Essential hypertension    GERD (gastroesophageal reflux disease)    Habitual alcohol use    Hyperlipidemia    Low back pain    Myocardial infarction ([place]) 2020   OSA on CPAP    Osteoarthritis    Polysubstance abuse ([place])    History of marijuana and Vicodin abuse   PVC's (premature ventricular contractions)    Seasonal allergies    Stroke [facility]) 2020    Past Surgical History:  Procedure Laterality Date   CARDIOVERSION N/A 11/19/2017   Procedure: CARDIOVERSION;  Surgeon: Herminio Commons, MD;  [facility];  Service: Cardiovascular;  Laterality: N/A;   COLONOSCOPY WITH PROPOFOL N/A 03/01/2020   Procedure: COLONOSCOPY WITH PROPOFOL;  Surgeon: Harvel Quale, MD;  [facility];  Service: Gastroenterology;  Laterality: N/A;  730   CORONARY ANGIOPLASTY WITH STENT PLACEMENT  2011   CORONARY ARTERY BYPASS GRAFT N/A 03/10/2019   Procedure: CORONARY ARTERY BYPASS GRAFTING (CABG)X 3 Using Left Internal Mammary Artery and Right Saphenous Vein Grafts;  Surgeon: Ivin Poot, MD;  Location: [place];  Service: Open Heart Surgery;  Laterality: N/A;   ESOPHAGOGASTRODUODENOSCOPY (EGD) WITH PROPOFOL N/A 03/01/2020   Procedure: ESOPHAGOGASTRODUODENOSCOPY (EGD) WITH PROPOFOL;  Surgeon: Harvel Quale, MD;  [facility];  Service: Gastroenterology;  Laterality: N/A;   LEFT HEART CATH AND CORONARY ANGIOGRAPHY N/A  03/08/2019   Procedure: LEFT HEART CATH AND CORONARY ANGIOGRAPHY;  Surgeon: Belva Crome, MD;  Location: [place] CV [facility];  Service: Cardiovascular;  Laterality: N/A;   POLYPECTOMY  03/01/2020   Procedure: POLYPECTOMY;  Surgeon: Harvel Quale, MD;  [facility];  Service: Gastroenterology;;   TEE WITHOUT CARDIOVERSION N/A 03/10/2019   Procedure: TRANSESOPHAGEAL ECHOCARDIOGRAM (TEE);  Surgeon: Prescott Gum, Collier Salina, MD;  Location: [place];  Service: Open Heart Surgery;  Laterality: N/A;    TONSILLECTOMY      Current Outpatient Medications  Medication Sig Dispense Refill   acetaminophen (TYLENOL) 325 MG tablet Take 1-2 tablets (325-650 mg total) by mouth every 4 (four) hours as needed for mild pain.     allopurinol (ZYLOPRIM) 300 MG tablet Take 300 mg by mouth daily.      amLODipine (NORVASC) 5 MG tablet TAKE 1 TABLET BY MOUTH EVERY DAY 90 tablet 3   atorvastatin (LIPITOR) 80 MG tablet TAKE 1 TABLET BY MOUTH EVERY DAY 90 tablet 1   Cholecalciferol (VITAMIN D-3) 1000 UNITS CAPS Take 1,000 Units by mouth every morning.      colchicine 0.6 MG tablet Take 1 tablet (0.6 mg total) by mouth daily at 6 PM. 30 tablet 1   Cyanocobalamin (VITAMIN B-12) 2500 MCG SUBL Place 2,500 mcg under the tongue every morning.      docusate sodium (COLACE) 100 MG capsule Take 2 capsules (200 mg total) by mouth daily. (Patient taking differently: Take 100 mg by mouth 2 (two) times daily.) 60 capsule 0   ELIQUIS 5 MG TABS tablet TAKE 1 TABLET BY MOUTH TWICE A DAY (Patient taking differently: Take 5 mg by mouth 2 (two) times daily.) 180 tablet 2   fluticasone (FLONASE) 50 MCG/ACT nasal spray Place 2 sprays into the nose daily.     folic acid (FOLVITE) 1 MG tablet TAKE ONE TABLET BY MOUTH EVERY DAY (Patient taking differently: Take 1 mg by mouth daily.) 30 tablet 6   furosemide (LASIX) 20 MG tablet TAKE 1 TABLET BY MOUTH TWICE A DAY 180 tablet 3   HYDROcodone-acetaminophen (NORCO) 10-325 MG tablet Take 1 tablet by mouth every 6 (six) hours as needed.     KLOR-CON M15 15 MEQ tablet TAKE 2 TABLETS (30 MEQ TOTAL) BY MOUTH DAILY. 180 tablet 3   lisinopril (ZESTRIL) 10 MG tablet TAKE 1 TABLET BY MOUTH EVERY DAY 90 tablet 3   loratadine (CLARITIN) 10 MG tablet Take 10 mg by mouth daily.     LORazepam (ATIVAN) 0.5 MG tablet Take 0.5 mg by mouth at bedtime.      meclizine (ANTIVERT) 32 MG tablet Take 32 mg by mouth daily as needed for dizziness.      nitroGLYCERIN (NITROSTAT) 0.4 MG SL tablet Place 1 tablet (0.4  mg total) under the tongue every 5 (five) minutes as needed for chest pain. 25 tablet 3   pantoprazole (PROTONIX) 40 MG tablet Take 1 tablet (40 mg total) by mouth daily. (Patient taking differently: Take 40 mg by mouth at bedtime.) 30 tablet 0   polyethylene glycol (MIRALAX / GLYCOLAX) packet Take 17 g by mouth at bedtime as needed for mild constipation.     tamsulosin (FLOMAX) 0.4 MG CAPS capsule Take 1 capsule (0.4 mg total) by mouth 2 (two) times daily. 60 capsule 0   thiamine 100 MG tablet Take 1 tablet (100 mg total) by mouth daily. 30 tablet 0   traZODone (DESYREL) 100 MG tablet TAKE 1 TABLET (100 MG TOTAL)  BY MOUTH AT BEDTIME AS NEEDED FOR SLEEP. 90 tablet 1   No current facility-administered medications for this visit.   Allergies:  Penicillins   ROS: No orthopnea or PND.  Reports compliance with CPAP.  Physical Exam: VS:  BP 136/60   Pulse (!) 46   Ht '5\' 6"'$  (1.676 m)   Wt 250 lb (113.4 kg)   SpO2 96%   BMI 40.35 kg/m , BMI Body mass index is 40.35 kg/m.  Wt Readings from Last 3 Encounters:  01/31/21 250 lb (113.4 kg)  12/12/20 249 lb 12.8 oz (113.3 kg)  08/05/20 251 lb 3.2 oz (113.9 kg)    General: Patient appears comfortable at rest. HEENT: Conjunctiva and lids normal, wearing a mask. Neck: Supple, no elevated JVP or carotid bruits. Lungs: Clear to auscultation, nonlabored breathing at rest. Cardiac: Irregularly irregular, no S3 or significant systolic murmur, no pericardial rub. Abdomen: Soft, nontender, bowel sounds present. Extremities: Chronic appearing lower leg edema and venous stasis.  ECG:  An ECG dated 04/07/2019 was personally reviewed today and demonstrated:  Rate controlled atrial fibrillation with nonspecific ST-T changes.  Recent Labwork: 02/28/2020: BUN 18; Creatinine, Ser 0.88; Potassium 3.9; Sodium 138     Component Value Date/Time   CHOL 170 03/07/2019 2358   TRIG 218 (H) 03/07/2019 2358   HDL 41 03/07/2019 2358   CHOLHDL 4.1 03/07/2019 2358    VLDL 44 (H) 03/07/2019 2358   LDLCALC 85 03/07/2019 2358  July 2021: Cholesterol 104, triglycerides 51, LDL 44, HDL 48, hemoglobin A1c 6%  Other Studies Reviewed Today:  Echocardiogram 03/24/2019:  1. Left ventricular ejection fraction, by visual estimation, is 55 to  60%. The left ventricle has normal function. There is no left ventricular  hypertrophy.   2. Global right ventricle was not well visualized.The right ventricular  size is not well visualized. Right vetricular wall thickness was not  assessed.   3. Left atrial size was not well visualized.   4. Right atrial size was not well visualized.   5. Moderate pericardial effusion.   6. There is a moderate size pericardial effusion posterior to the heart.  There is no RV or RA collapse. The IVC is not dilated . There is  significant respiratory variability with the MV and TV inflows but these  changes may be from other causes.   7. The mitral valve was not well visualized. No evidence of mitral valve  regurgitation.   8. The tricuspid valve is not well visualized. Tricuspid valve  regurgitation was not assessed by color flow Doppler.   9. The aortic valve was not well visualized Aortic valve regurgitation  was not assessed by color flow Doppler.  10. The pulmonic valve was not well visualized. Pulmonic valve  regurgitation was not assessed by color flow Doppler.  11. The aortic root was not well visualized.  12. The interatrial septum was not well visualized.   Assessment and Plan:  1.  Multivessel CAD status post CABG in October 2020.  He does not describe any active angina at this time and continues in maintenance exercise program 3 days a week.  Does report dyspnea exertion and NYHA class II-III which has been chronic, likely multifactorial.  Plan is to stop aspirin at this point since he is on Eliquis.  Also stopping Lopressor due to bradycardia.  Follow-up echocardiogram to reassess LVEF.  Otherwise continue Lipitor, Norvasc,  lisinopril, Lasix, and potassium supplement.  I talked with him about SGLT2 inhibitor, he wanted to consider things  further.  2.  Permanent atrial fibrillation with CHA2DS2-VASc score of 5.  He is on Eliquis for stroke prophylaxis, reports easy bruising but no major bleeding problems.  Aspirin being discontinued as discussed above.  Requesting recent lab work from Dr. [place] Crane.  3.  Moderate posterior pericardial effusion as of October 2020, follow-up echocardiogram will be obtained.  4.  Mixed hyperlipidemia, on high-dose Lipitor.  Last LDL 44 in July 2021, requesting interval lab work.  Medication Adjustments/Labs and Tests Ordered: Current medicines are reviewed at length with the patient today.  Concerns regarding medicines are outlined above.   Tests Ordered: Orders Placed This Encounter  Procedures   EKG 12-Lead   ECHOCARDIOGRAM COMPLETE     Medication Changes: No orders of the defined types were placed in this encounter.   Disposition:  Follow up  6 months.  Signed, Satira Sark, MD, [facility] 01/31/2021 9:03 AM    [place] [place] [facility] [address], [place], [place] [address] Phone: [phone]; Fax: [phone]

## 2021-01-31 ENCOUNTER — Other Ambulatory Visit: Payer: Self-pay

## 2021-01-31 ENCOUNTER — Ambulatory Visit: Payer: Medicare Other | Admitting: Cardiology

## 2021-01-31 ENCOUNTER — Encounter: Payer: Self-pay | Admitting: *Deleted

## 2021-01-31 ENCOUNTER — Encounter: Payer: Self-pay | Admitting: Cardiology

## 2021-01-31 VITALS — BP 136/60 | HR 46 | Ht 66.0 in | Wt 250.0 lb

## 2021-01-31 DIAGNOSIS — I10 Essential (primary) hypertension: Secondary | ICD-10-CM | POA: Diagnosis not present

## 2021-01-31 DIAGNOSIS — G4733 Obstructive sleep apnea (adult) (pediatric): Secondary | ICD-10-CM | POA: Diagnosis not present

## 2021-01-31 DIAGNOSIS — I110 Hypertensive heart disease with heart failure: Secondary | ICD-10-CM | POA: Diagnosis not present

## 2021-01-31 DIAGNOSIS — E782 Mixed hyperlipidemia: Secondary | ICD-10-CM

## 2021-01-31 DIAGNOSIS — I3139 Other pericardial effusion (noninflammatory): Secondary | ICD-10-CM

## 2021-01-31 DIAGNOSIS — I4821 Permanent atrial fibrillation: Secondary | ICD-10-CM | POA: Diagnosis not present

## 2021-01-31 DIAGNOSIS — I25119 Atherosclerotic heart disease of native coronary artery with unspecified angina pectoris: Secondary | ICD-10-CM

## 2021-01-31 DIAGNOSIS — K922 Gastrointestinal hemorrhage, unspecified: Secondary | ICD-10-CM | POA: Diagnosis not present

## 2021-01-31 DIAGNOSIS — R7301 Impaired fasting glucose: Secondary | ICD-10-CM | POA: Diagnosis not present

## 2021-01-31 DIAGNOSIS — I313 Pericardial effusion (noninflammatory): Secondary | ICD-10-CM

## 2021-01-31 DIAGNOSIS — D509 Iron deficiency anemia, unspecified: Secondary | ICD-10-CM | POA: Diagnosis not present

## 2021-01-31 DIAGNOSIS — I2510 Atherosclerotic heart disease of native coronary artery without angina pectoris: Secondary | ICD-10-CM | POA: Diagnosis not present

## 2021-01-31 DIAGNOSIS — I4891 Unspecified atrial fibrillation: Secondary | ICD-10-CM | POA: Diagnosis not present

## 2021-01-31 DIAGNOSIS — Z0001 Encounter for general adult medical examination with abnormal findings: Secondary | ICD-10-CM | POA: Diagnosis not present

## 2021-01-31 NOTE — Patient Instructions (Addendum)
Medication Instructions:  Your physician has recommended you make the following change in your medication:  Stop aspirin Stop metoprolol tartrate Continue other medications the same  Labwork: none  Testing/Procedures: Your physician has requested that you have an echocardiogram. Echocardiography is a painless test that uses sound waves to create images of your heart. It provides your doctor with information about the size and shape of your heart and how well your heart's chambers and valves are working. This procedure takes approximately one hour. There are no restrictions for this procedure.  Follow-Up: Your physician recommends that you schedule a follow-up appointment in: 6 months  Any Other Special Instructions Will Be Listed Below (If Applicable).  If you need a refill on your cardiac medications before your next appointment, please call your pharmacy.

## 2021-02-05 DIAGNOSIS — M1990 Unspecified osteoarthritis, unspecified site: Secondary | ICD-10-CM | POA: Diagnosis not present

## 2021-02-05 DIAGNOSIS — I10 Essential (primary) hypertension: Secondary | ICD-10-CM | POA: Diagnosis not present

## 2021-02-25 ENCOUNTER — Ambulatory Visit: Payer: Medicare Other | Admitting: Internal Medicine

## 2021-02-25 ENCOUNTER — Other Ambulatory Visit: Payer: Self-pay

## 2021-02-25 VITALS — BP 134/74 | HR 62 | Ht 67.0 in | Wt 249.0 lb

## 2021-02-25 DIAGNOSIS — I4821 Permanent atrial fibrillation: Secondary | ICD-10-CM

## 2021-02-25 DIAGNOSIS — I10 Essential (primary) hypertension: Secondary | ICD-10-CM | POA: Diagnosis not present

## 2021-02-25 DIAGNOSIS — I2510 Atherosclerotic heart disease of native coronary artery without angina pectoris: Secondary | ICD-10-CM | POA: Diagnosis not present

## 2021-02-25 MED ORDER — FUROSEMIDE 40 MG PO TABS: 40.0000 mg | ORAL_TABLET | Freq: Every day | ORAL | 3 refills | Status: DC

## 2021-02-25 NOTE — Progress Notes (Signed)
HPI [name] returns today for followup. He is a pleasant 65 yo man with a h/o persistent atrial fib who was placed on amiodarone. He has CAD, s/p CABG, and obesity. His afib resolved but he felt poorly and his amiodarone was stopped. He feels better off of amiodarone and out of rhythm. He has had problems with peripheral edema.  Allergies  Allergen Reactions   Penicillins Hives and Rash    Has patient had a PCN reaction causing immediate rash, facial/tongue/throat swelling, SOB or lightheadedness with hypotension: Unknown Has patient had a PCN reaction causing severe rash involving mucus membranes or skin necrosis: Unknown Has patient had a PCN reaction that required hospitalization: No Has patient had a PCN reaction occurring within the last 10 years: No If all of the above answers are "NO", then may proceed with Cephalosporin use.       Current Outpatient Medications  Medication Sig Dispense Refill   acetaminophen (TYLENOL) 325 MG tablet Take 1-2 tablets (325-650 mg total) by mouth every 4 (four) hours as needed for mild pain.     allopurinol (ZYLOPRIM) 300 MG tablet Take 300 mg by mouth daily.      amLODipine (NORVASC) 5 MG tablet TAKE 1 TABLET BY MOUTH EVERY DAY 90 tablet 3   atorvastatin (LIPITOR) 80 MG tablet TAKE 1 TABLET BY MOUTH EVERY DAY 90 tablet 1   Cholecalciferol (VITAMIN D-3) 1000 UNITS CAPS Take 1,000 Units by mouth every morning.      colchicine 0.6 MG tablet Take 1 tablet (0.6 mg total) by mouth daily at 6 PM. 30 tablet 1   Cyanocobalamin (VITAMIN B-12) 2500 MCG SUBL Place 2,500 mcg under the tongue every morning.      docusate sodium (COLACE) 100 MG capsule Take 2 capsules (200 mg total) by mouth daily. (Patient taking differently: Take 100 mg by mouth 2 (two) times daily.) 60 capsule 0   ELIQUIS 5 MG TABS tablet TAKE 1 TABLET BY MOUTH TWICE A DAY (Patient taking differently: Take 5 mg by mouth 2 (two) times daily.) 180 tablet 2   fluticasone (FLONASE) 50  MCG/ACT nasal spray Place 2 sprays into the nose daily.     folic acid (FOLVITE) 1 MG tablet TAKE ONE TABLET BY MOUTH EVERY DAY (Patient taking differently: Take 1 mg by mouth daily.) 30 tablet 6   furosemide (LASIX) 20 MG tablet TAKE 1 TABLET BY MOUTH TWICE A DAY 180 tablet 3   HYDROcodone-acetaminophen (NORCO) 10-325 MG tablet Take 1 tablet by mouth every 6 (six) hours as needed.     KLOR-CON M15 15 MEQ tablet TAKE 2 TABLETS (30 MEQ TOTAL) BY MOUTH DAILY. 180 tablet 3   lisinopril (ZESTRIL) 10 MG tablet TAKE 1 TABLET BY MOUTH EVERY DAY 90 tablet 3   loratadine (CLARITIN) 10 MG tablet Take 10 mg by mouth daily.     LORazepam (ATIVAN) 0.5 MG tablet Take 0.5 mg by mouth at bedtime.      meclizine (ANTIVERT) 32 MG tablet Take 32 mg by mouth daily as needed for dizziness.      nitroGLYCERIN (NITROSTAT) 0.4 MG SL tablet Place 1 tablet (0.4 mg total) under the tongue every 5 (five) minutes as needed for chest pain. 25 tablet 3   pantoprazole (PROTONIX) 40 MG tablet Take 1 tablet (40 mg total) by mouth daily. (Patient taking differently: Take 40 mg by mouth at bedtime.) 30 tablet 0   polyethylene glycol (MIRALAX / GLYCOLAX) packet Take 17 g by  mouth at bedtime as needed for mild constipation.     tamsulosin (FLOMAX) 0.4 MG CAPS capsule Take 1 capsule (0.4 mg total) by mouth 2 (two) times daily. 60 capsule 0   thiamine 100 MG tablet Take 1 tablet (100 mg total) by mouth daily. 30 tablet 0   traZODone (DESYREL) 100 MG tablet TAKE 1 TABLET (100 MG TOTAL) BY MOUTH AT BEDTIME AS NEEDED FOR SLEEP. 90 tablet 1   No current facility-administered medications for this visit.     Past Medical History:  Diagnosis Date   Atrial fibrillation ([place])    a. dx 10/2017. b. s/p DCCV on 11/19/2017 with successful conversion to NSR but back in AFIB 3 weeks later   CAD (coronary artery disease)    a. s/p PCI to LAD 2001. b. s/p stenting to the RCA and mid LAD December 2011, residual distal RCA disease treated medically.  c.  Multivessel disease status post CABG October 2020   Carotid artery disease ([place])    Essential hypertension    GERD (gastroesophageal reflux disease)    Habitual alcohol use    Hyperlipidemia    Low back pain    Myocardial infarction ([place]) 2020   OSA on CPAP    Osteoarthritis    Polysubstance abuse ([place])    History of marijuana and Vicodin abuse   PVC's (premature ventricular contractions)    Seasonal allergies    Stroke ([place]) 2020    ROS:   All systems reviewed and negative except as noted in the HPI.   Past Surgical History:  Procedure Laterality Date   CARDIOVERSION N/A 11/19/2017   Procedure: CARDIOVERSION;  Surgeon: Herminio Commons, MD;  [facility];  Service: Cardiovascular;  Laterality: N/A;   COLONOSCOPY WITH PROPOFOL N/A 03/01/2020   Procedure: COLONOSCOPY WITH PROPOFOL;  Surgeon: Harvel Quale, MD;  [facility];  Service: Gastroenterology;  Laterality: N/A;  730   CORONARY ANGIOPLASTY WITH STENT PLACEMENT  2011   CORONARY ARTERY BYPASS GRAFT N/A 03/10/2019   Procedure: CORONARY ARTERY BYPASS GRAFTING (CABG)X 3 Using Left Internal Mammary Artery and Right Saphenous Vein Grafts;  Surgeon: Ivin Poot, MD;  Location: [place];  Service: Open Heart Surgery;  Laterality: N/A;   ESOPHAGOGASTRODUODENOSCOPY (EGD) WITH PROPOFOL N/A 03/01/2020   Procedure: ESOPHAGOGASTRODUODENOSCOPY (EGD) WITH PROPOFOL;  Surgeon: Harvel Quale, MD;  [facility];  Service: Gastroenterology;  Laterality: N/A;   LEFT HEART CATH AND CORONARY ANGIOGRAPHY N/A 03/08/2019   Procedure: LEFT HEART CATH AND CORONARY ANGIOGRAPHY;  Surgeon: Belva Crome, MD;  Location: [place] [facility];  Service: Cardiovascular;  Laterality: N/A;   POLYPECTOMY  03/01/2020   Procedure: POLYPECTOMY;  Surgeon: Harvel Quale, MD;  [facility];  Service: Gastroenterology;;   TEE WITHOUT CARDIOVERSION N/A 03/10/2019   Procedure:  TRANSESOPHAGEAL ECHOCARDIOGRAM (TEE);  Surgeon: Prescott Gum, Collier Salina, MD;  Location: [place];  Service: Open Heart Surgery;  Laterality: N/A;   TONSILLECTOMY       Family History  Problem Relation Age of Onset   Stroke Mother    Other Mother        small bowel obstruction   Heart attack Father    Heart attack Brother    Healthy Son      Social History   Socioeconomic History   Marital status: Married    Spouse name: Not on file   Number of children: 1   Years of education: Not on file  Highest education level: 9th grade  Occupational History    Employer: UNEMPLOYED    Comment: works Social worker employed  Tobacco Use   Smoking status: Former    Packs/day: 1.00    Years: 35.00    Pack years: 35.00    Types: Cigarettes    Start date: 09/18/1967    Quit date: 06/09/1999    Years since quitting: 21.7   Smokeless tobacco: Never  Vaping Use   Vaping Use: Never used  Substance and Sexual Activity   Alcohol use: Not Currently    Alcohol/week: 0.0 standard drinks    Comment: .stopped October on 2020   Drug use: Yes    Types: Marijuana    Comment: last smoked 2 months ago   Sexual activity: Not on file  Other Topics Concern   Not on file  Social History Narrative   No exercise   Pt lives with spouse in 1 story home   Right handed   Drinks coffee every morning, not tea, no soda   One son high level of education is 9th grade   Social Determinants of Health   Financial Resource Strain: Not on file  Food Insecurity: Not on file  Transportation Needs: Not on file  Physical Activity: Not on file  Stress: Not on file  Social Connections: Not on file  Intimate Partner Violence: Not on file     BP 134/74   Pulse 62   Ht 5\' 7"  (1.702 m)   Wt 249 lb (112.9 kg)   BMI 39.00 kg/m   Physical Exam:  Well appearing NAD HEENT: Unremarkable Neck:  No JVD, no thyromegally Lymphatics:  No adenopathy Back:  No CVA tenderness Lungs:  Clear with no wheezes HEART:   IRegular rate rhythm, no murmurs, no rubs, no clicks Abd:  soft, positive bowel sounds, no organomegally, no rebound, no guarding Ext:  2 plus pulses, 2+ edema, no cyanosis, no clubbing Skin:  No rashes no nodules Neuro:  CN II through XII intact, motor grossly intact   DEVICE  Normal device function.  See PaceArt for details.   Assess/Plan:  1. Atrial fib - his VR is controlled. He feels better off of amiodarone.  2. CAD - he denies anginal symptoms. No change in his meds. 3. HTN - his bp is well controlled.  4. Dyslipidemia - he will continue his atorvastatin.  5. Peripheral edema - he is instructed to stop amlodipine. He will take lasix 40 mg daily instead of 20 bid.   Carleene Overlie Bella Brummet,MD

## 2021-02-25 NOTE — Patient Instructions (Signed)
Medication Instructions:   Stop Taking Norvasc  Take Lasix 40 mg Daily   *If you need a refill on your cardiac medications before your next appointment, please call your pharmacy*   Lab Work: NONE   If you have labs (blood work) drawn today and your tests are completely normal, you will receive your results only by: [place] (if you have MyChart) OR A paper copy in the mail If you have any lab test that is abnormal or we need to change your treatment, we will call you to review the results.   Testing/Procedures: NONE    Follow-Up: At [facility], you and your health needs are our priority.  As part of our continuing mission to provide you with exceptional heart care, we have created designated Provider Care Teams.  These Care Teams include your primary Cardiologist (physician) and Advanced Practice Providers (APPs -  Physician Assistants and Nurse Practitioners) who all work together to provide you with the care you need, when you need it.  We recommend signing up for the patient portal called "MyChart".  Sign up information is provided on this After Visit Summary.  MyChart is used to connect with patients for Virtual Visits (Telemedicine).  Patients are able to view lab/test results, encounter notes, upcoming appointments, etc.  Non-urgent messages can be sent to your provider as well.   To learn more about what you can do with MyChart, go to [email or web address].    Your next appointment:   6 month(s)  The format for your next appointment:   In Person  Provider:   Rozann Lesches, MD   Other Instructions Thank you for choosing [name]!

## 2021-03-01 DIAGNOSIS — L03119 Cellulitis of unspecified part of limb: Secondary | ICD-10-CM | POA: Diagnosis not present

## 2021-03-07 DIAGNOSIS — M1990 Unspecified osteoarthritis, unspecified site: Secondary | ICD-10-CM | POA: Diagnosis not present

## 2021-03-07 DIAGNOSIS — I10 Essential (primary) hypertension: Secondary | ICD-10-CM | POA: Diagnosis not present

## 2021-03-12 ENCOUNTER — Telehealth: Payer: Self-pay | Admitting: Cardiology

## 2021-03-12 NOTE — Telephone Encounter (Signed)
NEW MESSAGE   PATIENT IS HAVING A GOUT FLAIR UP AND CAN NOT GET [facility] OF HIS PCP, HE WOULD LIKE TO HAVE DR Farmer SEND IN PRESCRIPTION OF PREDNISONE TO [facility] [place]. PLEASE CALL IF THIS CAN BE DONE .

## 2021-03-12 NOTE — Telephone Encounter (Signed)
Advised that our office do not treat gout. Advised to contact covering provider for PCP. Verbalized understanding of plan.

## 2021-03-13 ENCOUNTER — Other Ambulatory Visit: Payer: Self-pay

## 2021-03-13 ENCOUNTER — Ambulatory Visit (INDEPENDENT_AMBULATORY_CARE_PROVIDER_SITE_OTHER): Payer: Medicare Other

## 2021-03-13 DIAGNOSIS — Z23 Encounter for immunization: Secondary | ICD-10-CM

## 2021-03-13 DIAGNOSIS — I25119 Atherosclerotic heart disease of native coronary artery with unspecified angina pectoris: Secondary | ICD-10-CM

## 2021-03-13 LAB — ECHOCARDIOGRAM COMPLETE
AR max vel: 1.45 cm2
AV Area VTI: 1.5 cm2
AV Area mean vel: 1.67 cm2
AV Mean grad: 12.8 mmHg
AV Peak grad: 25.8 mmHg
AV Vena cont: 1.17 cm
Ao pk vel: 2.54 m/s
Area-P 1/2: 5.75 cm2
P 1/2 time: 750 msec
S' Lateral: 4.7 cm
Single Plane A4C EF: 46.9 %

## 2021-03-18 ENCOUNTER — Telehealth: Payer: Self-pay | Admitting: *Deleted

## 2021-03-18 DIAGNOSIS — I2510 Atherosclerotic heart disease of native coronary artery without angina pectoris: Secondary | ICD-10-CM

## 2021-03-18 NOTE — Telephone Encounter (Signed)
-----   Message from Satira Sark, MD sent at 03/13/2021  3:07 PM EDT ----- Results reviewed.  Pericardial effusion is now only trivial, previously moderate.  Difficult to quantify LVEF, would see if we can get a limited Definity contrast follow-up study to better evaluate.

## 2021-03-18 NOTE — Telephone Encounter (Signed)
Patient informed and verbalized understanding of plan. Copy sent to PCP 

## 2021-04-01 NOTE — Progress Notes (Deleted)
NEUROLOGY FOLLOW UP OFFICE NOTE  CALAB SACHSE [id number]  Assessment/Plan:   Recurrent loss of consciousness.   Atrial fibrillation Hypertension Hyperlipidemia OSA on CPAP  1  ***  Subjective:  Martin [name] is a 65 year old right-handed Caucasian male with atrial fibrillation, CAD, HTN, HLD, and OSA on CPAP who follows up for stroke and recurrent syncope.   UPDATE: Current medications:  Eliquis, atorvastatin 80mg , furosemide, lisinopril  Last seen in May 2021.  ***  LDL from August was 44. 2D Echocardiogram on 10/65/2022 showed trivial pericardial effusion put poorly visualized endocardium but LVEF probably 45-55% (limited study with echocontrast has been ordered).   HISTORY: He was admitted to [facility] on 03/07/2019 for non-STEMI.  He underwent CABG on 03/10/2019.  He had episode of hypotension (SBP 70s-90s) on 03/12/2019.  On 03/13/2019, he was noted to have left arm and leg weakness.  CT of head showed right distal ACA territory infarct.  MRI unable to be performed due to recent CABG. REPEAT CT OF THE HEAD THE FOLLOWING DAY SHOWED STABLE SMALL RIGHT ACA INFARCT.  CTA of head and neck showed no emergent large  vessel occlusion but there was a new partially clipped calcified clot in the right ACA A1.  Carotid Doppler showed 1 to 39% stenosis in the bilateral internal carotid arteries.  TEE showed ejection fraction 55 to 60% with no cardiac source of embolus.  Transcranial Doppler showed no proximal intracranial large vessel stenosis.  LDL was 85.  Hemoglobin A1c was 5.6.  He was on Eliquis prior to admission.  As per stroke protocol, he was placed on IV heparin with instructions to transition back to Eliquis.  He was continued on Lipitor 80mg  daily.  During hospitalization, he had an acute episode of confusion.  EEG was normal.  He was discharged to inpatient rehab.  Since hospital discharge, he has had two subsequent syncopal episodes that occurred while holding his  breath.  He reports that he passed out about a month ago.  He was walking down the hallway and felt lightheadedness, palpitations and tunnel vision.  He felt like he was going to pass out.  It occurred while he was holding his breath.  He doesn't know why.  He started to sit down but lost consciousness for a few seconds and woke up on the floor.  His wife heard him fall and when she came around, she found him on the floor.  No reported seizure activity.  He noted urinary incontinence.  Did not bite his tongue.  He underwent 24 hour ambulatory EEG on 06/19/2019 to 06/20/2019 which was normal but did not capture any events.  PAST MEDICAL HISTORY: Past Medical History:  Diagnosis Date   Atrial fibrillation ([place])    a. dx 10/2017. b. s/p DCCV on 11/19/2017 with successful conversion to NSR but back in AFIB 3 weeks later   CAD (coronary artery disease)    a. s/p PCI to LAD 2001. b. s/p stenting to the RCA and mid LAD December 2011, residual distal RCA disease treated medically. c.  Multivessel disease status post CABG October 2020   Carotid artery disease ([place])    Essential hypertension    GERD (gastroesophageal reflux disease)    Habitual alcohol use    Hyperlipidemia    Low back pain    Myocardial infarction ([place]) 2020   OSA on CPAP    Osteoarthritis    Polysubstance abuse ([place])    History of marijuana and Vicodin  abuse   PVC's (premature ventricular contractions)    Seasonal allergies    Stroke [facility]) 2020    MEDICATIONS: Current Outpatient Medications on File Prior to Visit  Medication Sig Dispense Refill   acetaminophen (TYLENOL) 325 MG tablet Take 1-2 tablets (325-650 mg total) by mouth every 4 (four) hours as needed for mild pain.     allopurinol (ZYLOPRIM) 300 MG tablet Take 300 mg by mouth daily.      atorvastatin (LIPITOR) 80 MG tablet TAKE 1 TABLET BY MOUTH EVERY DAY 90 tablet 1   Cholecalciferol (VITAMIN D-3) 1000 UNITS CAPS Take 1,000 Units by mouth every morning.      colchicine  0.6 MG tablet Take 1 tablet (0.6 mg total) by mouth daily at 6 PM. 30 tablet 1   Cyanocobalamin (VITAMIN B-12) 2500 MCG SUBL Place 2,500 mcg under the tongue every morning.      docusate sodium (COLACE) 100 MG capsule Take 2 capsules (200 mg total) by mouth daily. (Patient taking differently: Take 100 mg by mouth 2 (two) times daily.) 60 capsule 0   ELIQUIS 5 MG TABS tablet TAKE 1 TABLET BY MOUTH TWICE A DAY (Patient taking differently: Take 5 mg by mouth 2 (two) times daily.) 180 tablet 2   fluticasone (FLONASE) 50 MCG/ACT nasal spray Place 2 sprays into the nose daily.     folic acid (FOLVITE) 1 MG tablet TAKE ONE TABLET BY MOUTH EVERY DAY (Patient taking differently: Take 1 mg by mouth daily.) 30 tablet 6   furosemide (LASIX) 40 MG tablet Take 1 tablet (40 mg total) by mouth daily. 90 tablet 3   HYDROcodone-acetaminophen (NORCO) 10-325 MG tablet Take 1 tablet by mouth every 6 (six) hours as needed.     KLOR-CON M15 15 MEQ tablet TAKE 2 TABLETS (30 MEQ TOTAL) BY MOUTH DAILY. 180 tablet 3   lisinopril (ZESTRIL) 10 MG tablet TAKE 1 TABLET BY MOUTH EVERY DAY 90 tablet 3   loratadine (CLARITIN) 10 MG tablet Take 10 mg by mouth daily.     LORazepam (ATIVAN) 0.5 MG tablet Take 0.5 mg by mouth at bedtime.      meclizine (ANTIVERT) 32 MG tablet Take 32 mg by mouth daily as needed for dizziness.      nitroGLYCERIN (NITROSTAT) 0.4 MG SL tablet Place 1 tablet (0.4 mg total) under the tongue every 5 (five) minutes as needed for chest pain. 25 tablet 3   pantoprazole (PROTONIX) 40 MG tablet Take 1 tablet (40 mg total) by mouth daily. (Patient taking differently: Take 40 mg by mouth at bedtime.) 30 tablet 0   polyethylene glycol (MIRALAX / GLYCOLAX) packet Take 17 g by mouth at bedtime as needed for mild constipation.     tamsulosin (FLOMAX) 0.4 MG CAPS capsule Take 1 capsule (0.4 mg total) by mouth 2 (two) times daily. 60 capsule 0   thiamine 100 MG tablet Take 1 tablet (100 mg total) by mouth daily. 30 tablet  0   traZODone (DESYREL) 100 MG tablet TAKE 1 TABLET (100 MG TOTAL) BY MOUTH AT BEDTIME AS NEEDED FOR SLEEP. 90 tablet 1   No current facility-administered medications on file prior to visit.    ALLERGIES: Allergies  Allergen Reactions   Penicillins Hives and Rash    Has patient had a PCN reaction causing immediate rash, facial/tongue/throat swelling, SOB or lightheadedness with hypotension: Unknown Has patient had a PCN reaction causing severe rash involving mucus membranes or skin necrosis: Unknown Has patient had a PCN reaction that  required hospitalization: No Has patient had a PCN reaction occurring within the last 10 years: No If all of the above answers are "NO", then may proceed with Cephalosporin use.      FAMILY HISTORY: Family History  Problem Relation Age of Onset   Stroke Mother    Other Mother        small bowel obstruction   Heart attack Father    Heart attack Brother    Healthy Son       Objective:  *** General: No acute distress.  Patient appears ***-groomed.   Head:  Normocephalic/atraumatic Eyes:  Fundi examined but not visualized Neck: supple, no paraspinal tenderness, full range of motion Heart:  Regular rate and rhythm Lungs:  Clear to auscultation bilaterally Back: No paraspinal tenderness Neurological Exam: alert and oriented to person, place, and time.  Speech fluent and not dysarthric, language intact.  CN II-XII intact. Bulk and tone normal, muscle strength 5/5 throughout.  Sensation to light touch intact.  Deep tendon reflexes 2+ throughout, toes downgoing.  Finger to nose testing intact.  Gait normal, Romberg negative.   Metta Clines, DO  CC:  Allyn Kenner, MD

## 2021-04-03 ENCOUNTER — Encounter: Payer: Self-pay | Admitting: Neurology

## 2021-04-03 ENCOUNTER — Other Ambulatory Visit: Payer: Self-pay

## 2021-04-03 ENCOUNTER — Telehealth (INDEPENDENT_AMBULATORY_CARE_PROVIDER_SITE_OTHER): Payer: Medicare Other | Admitting: Neurology

## 2021-04-03 ENCOUNTER — Ambulatory Visit: Payer: Medicare Other | Admitting: Neurology

## 2021-04-03 VITALS — Ht 66.0 in | Wt 240.0 lb

## 2021-04-03 DIAGNOSIS — I4819 Other persistent atrial fibrillation: Secondary | ICD-10-CM | POA: Diagnosis not present

## 2021-04-03 DIAGNOSIS — G4733 Obstructive sleep apnea (adult) (pediatric): Secondary | ICD-10-CM | POA: Diagnosis not present

## 2021-04-03 DIAGNOSIS — R55 Syncope and collapse: Secondary | ICD-10-CM

## 2021-04-03 DIAGNOSIS — I63421 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: Secondary | ICD-10-CM

## 2021-04-03 DIAGNOSIS — I10 Essential (primary) hypertension: Secondary | ICD-10-CM | POA: Diagnosis not present

## 2021-04-03 DIAGNOSIS — E785 Hyperlipidemia, unspecified: Secondary | ICD-10-CM

## 2021-04-03 DIAGNOSIS — Z9989 Dependence on other enabling machines and devices: Secondary | ICD-10-CM | POA: Diagnosis not present

## 2021-04-03 NOTE — Progress Notes (Signed)
Virtual Visit via Video Note The purpose of this virtual visit is to provide medical care while limiting exposure to the novel coronavirus.    Consent was obtained for video visit:  Yes.   Answered questions that patient had about telehealth interaction:  Yes.   I discussed the limitations, risks, security and privacy concerns of performing an evaluation and management service by telemedicine. I also discussed with the patient that there may be a patient responsible charge related to this service. The patient expressed understanding and agreed to proceed.  Pt location: Home Physician Location: office Name of referring provider:  [name], MD I connected with Martin [name] at patients initiation/request on 04/03/2021 at  8:50 AM EDT by video enabled telemedicine application and verified that I am speaking with the correct person using two identifiers. Pt MRN:  [id number] Pt DOB:  08/24/1955 Video Participants:  Martin [name]  Assessment and Plan:   Right posterior ACA territory infarct, cryptogenic.  Unclear if may be secondary to  Recurrent syncope - no recent events  Atrial fibrillation Hypertension Hyperlipidemia OSA on CPAP  1  Secondary stroke prevention as managed by PCP and cardiology:  - Eliquis  - atorvastatin LDL goal less than 70  - Normotensive blood pressure  - Hgb A1c goal less than 7 2.  CPAP 3.  Mediterranean diet 4.  Follow up as needed.  History of Present Illness:  Martin Sprung. [name] is a 65 year old right-handed Caucasian male with atrial fibrillation, CAD, HTN, HLD, and OSA on CPAP who follows up for stroke and recurrent syncope.  Cardiology notes reviewed.   UPDATE: Current medications:  Eliquis, atorvastatin 80mg , furosemide, lisinopril  Last seen in May 2021.  Since last visit, he has been caring for his wife who was diagnosed with ALS.  He is still having mild weakness and unsteady gait.  He goes to rehab 3 times a week.  No recurrent  syncope since last visit.    LDL from August was 44. 2D Echocardiogram on 10/65/2022 showed trivial pericardial effusion put poorly visualized endocardium but LVEF probably 45-55% (limited study with echocontrast has been ordered).   HISTORY: He was admitted to [facility] on 03/07/2019 for non-STEMI.  He underwent CABG on 03/10/2019.  He had episode of hypotension (SBP 70s-90s) on 03/12/2019.  On 03/13/2019, he was noted to have left arm and leg weakness.  CT of head showed right distal ACA territory infarct.  MRI unable to be performed due to recent CABG. REPEAT CT OF THE HEAD THE FOLLOWING DAY SHOWED STABLE SMALL RIGHT ACA INFARCT.  CTA of head and neck showed no emergent large  vessel occlusion but there was a new partially clipped calcified clot in the right ACA A1.  Carotid Doppler showed 1 to 39% stenosis in the bilateral internal carotid arteries.  TEE showed ejection fraction 55 to 60% with no cardiac source of embolus.  Transcranial Doppler showed no proximal intracranial large vessel stenosis.  LDL was 85.  Hemoglobin A1c was 5.6.  He was on Eliquis prior to admission.  As per stroke protocol, he was placed on IV heparin with instructions to transition back to Eliquis.  He was continued on Lipitor 80mg  daily.  During hospitalization, he had an acute episode of confusion.  EEG was normal.  He was discharged to inpatient rehab.  Since hospital discharge, he has had two subsequent syncopal episodes that occurred while holding his breath.  He reports that he passed out  about a month ago.  He was walking down the hallway and felt lightheadedness, palpitations and tunnel vision.  He felt like he was going to pass out.  It occurred while he was holding his breath.  He doesn't know why.  He started to sit down but lost consciousness for a few seconds and woke up on the floor.  His wife heard him fall and when she came around, she found him on the floor.  No reported seizure activity.  He noted urinary  incontinence.  Did not bite his tongue.  He underwent 24 hour ambulatory EEG on 06/19/2019 to 06/20/2019 which was normal but did not capture any events.  Past Medical History: Past Medical History:  Diagnosis Date   Atrial fibrillation ([place])    a. dx 10/2017. b. s/p DCCV on 11/19/2017 with successful conversion to NSR but back in AFIB 3 weeks later   CAD (coronary artery disease)    a. s/p PCI to LAD 2001. b. s/p stenting to the RCA and mid LAD December 2011, residual distal RCA disease treated medically. c.  Multivessel disease status post CABG October 2020   Carotid artery disease ([place])    Essential hypertension    GERD (gastroesophageal reflux disease)    Habitual alcohol use    Hyperlipidemia    Low back pain    Myocardial infarction ([place]) 2020   OSA on CPAP    Osteoarthritis    Polysubstance abuse ([place])    History of marijuana and Vicodin abuse   PVC's (premature ventricular contractions)    Seasonal allergies    Stroke ([place]) 2020    Medications: Outpatient Encounter Medications as of 04/03/2021  Medication Sig Note   acetaminophen (TYLENOL) 325 MG tablet Take 1-2 tablets (325-650 mg total) by mouth every 4 (four) hours as needed for mild pain.    allopurinol (ZYLOPRIM) 300 MG tablet Take 300 mg by mouth daily.     atorvastatin (LIPITOR) 80 MG tablet TAKE 1 TABLET BY MOUTH EVERY DAY    Cholecalciferol (VITAMIN D-3) 1000 UNITS CAPS Take 1,000 Units by mouth every morning.     colchicine 0.6 MG tablet Take 1 tablet (0.6 mg total) by mouth daily at 6 PM.    Cyanocobalamin (VITAMIN B-12) 2500 MCG SUBL Place 2,500 mcg under the tongue every morning.     docusate sodium (COLACE) 100 MG capsule Take 2 capsules (200 mg total) by mouth daily. (Patient taking differently: Take 100 mg by mouth 2 (two) times daily.)    ELIQUIS 5 MG TABS tablet TAKE 1 TABLET BY MOUTH TWICE A DAY (Patient taking differently: Take 5 mg by mouth 2 (two) times daily.) 02/28/2020: Last dose was 9/21. On hold for  procedure.   fluticasone (FLONASE) 50 MCG/ACT nasal spray Place 2 sprays into the nose daily.    folic acid (FOLVITE) 1 MG tablet TAKE ONE TABLET BY MOUTH EVERY DAY (Patient taking differently: Take 1 mg by mouth daily.)    furosemide (LASIX) 40 MG tablet Take 1 tablet (40 mg total) by mouth daily.    HYDROcodone-acetaminophen (NORCO) 10-325 MG tablet Take 1 tablet by mouth every 6 (six) hours as needed.    KLOR-CON M15 15 MEQ tablet TAKE 2 TABLETS (30 MEQ TOTAL) BY MOUTH DAILY.    lisinopril (ZESTRIL) 10 MG tablet TAKE 1 TABLET BY MOUTH EVERY DAY    loratadine (CLARITIN) 10 MG tablet Take 10 mg by mouth daily.    LORazepam (ATIVAN) 0.5 MG tablet Take 0.5 mg by  mouth at bedtime.     meclizine (ANTIVERT) 32 MG tablet Take 32 mg by mouth daily as needed for dizziness.     nitroGLYCERIN (NITROSTAT) 0.4 MG SL tablet Place 1 tablet (0.4 mg total) under the tongue every 5 (five) minutes as needed for chest pain.    pantoprazole (PROTONIX) 40 MG tablet Take 1 tablet (40 mg total) by mouth daily. (Patient taking differently: Take 40 mg by mouth at bedtime.)    polyethylene glycol (MIRALAX / GLYCOLAX) packet Take 17 g by mouth at bedtime as needed for mild constipation.    tamsulosin (FLOMAX) 0.4 MG CAPS capsule Take 1 capsule (0.4 mg total) by mouth 2 (two) times daily.    thiamine 100 MG tablet Take 1 tablet (100 mg total) by mouth daily.    traZODone (DESYREL) 100 MG tablet TAKE 1 TABLET (100 MG TOTAL) BY MOUTH AT BEDTIME AS NEEDED FOR SLEEP.    No facility-administered encounter medications on file as of 04/03/2021.    Allergies: Allergies  Allergen Reactions   Penicillins Hives and Rash    Has patient had a PCN reaction causing immediate rash, facial/tongue/throat swelling, SOB or lightheadedness with hypotension: Unknown Has patient had a PCN reaction causing severe rash involving mucus membranes or skin necrosis: Unknown Has patient had a PCN reaction that required hospitalization: No Has  patient had a PCN reaction occurring within the last 10 years: No If all of the above answers are "NO", then may proceed with Cephalosporin use.      Family History: Family History  Problem Relation Age of Onset   Stroke Mother    Other Mother        small bowel obstruction   Heart attack Father    Heart attack Brother    Healthy Son     Observations/Objective:   Height 5\' 6"  (1.676 m), weight 240 lb (108.9 kg). No acute distress.  Alert and oriented.  Speech fluent and not dysarthric.  Language intact.  Eyes orthophoric on primary gaze.  Face symmetric.   Follow Up Instructions:    -I discussed the assessment and treatment plan with the patient. The patient was provided an opportunity to ask questions and all were answered. The patient agreed with the plan and demonstrated an understanding of the instructions.   The patient was advised to call back or seek an in-person evaluation if the symptoms worsen or if the condition fails to improve as anticipated.   Dudley Major, DO

## 2021-04-03 NOTE — Patient Instructions (Signed)
Continue medications as prescribed by your other doctors Use CPAP Mediterranean diet  Mediterranean Diet A Mediterranean diet refers to food and lifestyle choices that are based on the traditions of countries located on the [employer]. This way of eating has been shown to help prevent certain conditions and improve outcomes for people who have chronic diseases, like kidney disease and heart disease. What are tips for following this plan? Lifestyle Cook and eat meals together with your family, when possible. Drink enough fluid to keep your urine clear or pale yellow. Be physically active every day. This includes: Aerobic exercise like running or swimming. Leisure activities like gardening, walking, or housework. Get 7-8 hours of sleep each night. If recommended by your health care provider, drink red wine in moderation. This means 1 glass a day for nonpregnant women and 2 glasses a day for men. A glass of wine equals 5 oz (150 mL). Reading food labels  Check the serving size of packaged foods. For foods such as rice and pasta, the serving size refers to the amount of cooked product, not dry. Check the total fat in packaged foods. Avoid foods that have saturated fat or trans fats. Check the ingredients list for added sugars, such as corn syrup. Shopping At the grocery store, buy most of your food from the areas near the walls of the store. This includes: Fresh fruits and vegetables (produce). Grains, beans, nuts, and seeds. Some of these may be available in unpackaged forms or large amounts (in bulk). Fresh seafood. Poultry and eggs. Low-fat dairy products. Buy whole ingredients instead of prepackaged foods. Buy fresh fruits and vegetables in-season from local farmers markets. Buy frozen fruits and vegetables in resealable bags. If you do not have access to quality fresh seafood, buy precooked frozen shrimp or canned fish, such as tuna, salmon, or sardines. Buy small amounts of  raw or cooked vegetables, salads, or olives from the deli or salad bar at your store. Stock your pantry so you always have certain foods on hand, such as olive oil, canned tuna, canned tomatoes, rice, pasta, and beans. Cooking Cook foods with extra-virgin olive oil instead of using butter or other vegetable oils. Have meat as a side dish, and have vegetables or grains as your main dish. This means having meat in small portions or adding small amounts of meat to foods like pasta or stew. Use beans or vegetables instead of meat in common dishes like chili or lasagna. Experiment with different cooking methods. Try roasting or broiling vegetables instead of steaming or sauteing them. Add frozen vegetables to soups, stews, pasta, or rice. Add nuts or seeds for added healthy fat at each meal. You can add these to yogurt, salads, or vegetable dishes. Marinate fish or vegetables using olive oil, lemon juice, garlic, and fresh herbs. Meal planning  Plan to eat 1 vegetarian meal one day each week. Try to work up to 2 vegetarian meals, if possible. Eat seafood 2 or more times a week. Have healthy snacks readily available, such as: Vegetable sticks with hummus. Greek yogurt. Fruit and nut trail mix. Eat balanced meals throughout the week. This includes: Fruit: 2-3 servings a day Vegetables: 4-5 servings a day Low-fat dairy: 2 servings a day Fish, poultry, or lean meat: 1 serving a day Beans and legumes: 2 or more servings a week Nuts and seeds: 1-2 servings a day Whole grains: 6-8 servings a day Extra-virgin olive oil: 3-4 servings a day Limit red meat and sweets to only a  few servings a month What are my food choices? Mediterranean diet Recommended Grains: Whole-grain pasta. Brown rice. Bulgar wheat. Polenta. Couscous. Whole-wheat bread. [place]. Vegetables: Artichokes. Beets. Broccoli. Cabbage. Carrots. Eggplant. Green beans. Chard. Kale. Spinach. Onions. Leeks. Peas. Squash. Tomatoes.  Peppers. Radishes. Fruits: Apples. Apricots. Avocado. Berries. Bananas. Cherries. Dates. Figs. Grapes. Lemons. Melon. Oranges. Peaches. Plums. Pomegranate. Meats and other protein foods: Beans. Almonds. Sunflower seeds. Pine nuts. Peanuts. [place]. Salmon. Scallops. Shrimp. [place]. Tilapia. Clams. Oysters. Eggs. Dairy: Low-fat milk. Cheese. Greek yogurt. Beverages: Water. Red wine. Herbal tea. Fats and oils: Extra virgin olive oil. Avocado oil. Grape seed oil. Sweets and desserts: [place] yogurt with honey. Baked apples. Poached pears. Trail mix. Seasoning and other foods: Basil. Cilantro. Coriander. Cumin. Mint. Parsley. Sage. Rosemary. Tarragon. Garlic. Oregano. Thyme. Pepper. Balsalmic vinegar. Tahini. Hummus. Tomato sauce. Olives. Mushrooms. Limit these Grains: Prepackaged pasta or rice dishes. Prepackaged cereal with added sugar. Vegetables: Deep fried potatoes (french fries). Fruits: Fruit canned in syrup. Meats and other protein foods: Beef. Pork. Lamb. Poultry with skin. Hot dogs. [place] Salines. Dairy: Ice cream. Sour cream. Whole milk. Beverages: Juice. Sugar-sweetened soft drinks. Beer. Liquor and spirits. Fats and oils: Butter. Canola oil. Vegetable oil. Beef fat (tallow). Lard. Sweets and desserts: Cookies. Cakes. Pies. Candy. Seasoning and other foods: Mayonnaise. Premade sauces and marinades. The items listed may not be a complete list. Talk with your dietitian about what dietary choices are right for you. Summary The Mediterranean diet includes both food and lifestyle choices. Eat a variety of fresh fruits and vegetables, beans, nuts, seeds, and whole grains. Limit the amount of red meat and sweets that you eat. Talk with your health care provider about whether it is safe for you to drink red wine in moderation. This means 1 glass a day for nonpregnant women and 2 glasses a day for men. A glass of wine equals 5 oz (150 mL). This information is not intended to replace advice given to you by  your health care provider. Make sure you discuss any questions you have with your health care provider. Document Revised: 01/23/2016 Document Reviewed: 01/16/2016 Elsevier Patient Education  [employer].

## 2021-04-07 DIAGNOSIS — M1990 Unspecified osteoarthritis, unspecified site: Secondary | ICD-10-CM | POA: Diagnosis not present

## 2021-04-07 DIAGNOSIS — I10 Essential (primary) hypertension: Secondary | ICD-10-CM | POA: Diagnosis not present

## 2021-04-10 DIAGNOSIS — G894 Chronic pain syndrome: Secondary | ICD-10-CM | POA: Diagnosis not present

## 2021-04-10 DIAGNOSIS — I998 Other disorder of circulatory system: Secondary | ICD-10-CM | POA: Diagnosis not present

## 2021-04-10 DIAGNOSIS — L03119 Cellulitis of unspecified part of limb: Secondary | ICD-10-CM | POA: Diagnosis not present

## 2021-04-10 DIAGNOSIS — L0390 Cellulitis, unspecified: Secondary | ICD-10-CM | POA: Diagnosis not present

## 2021-04-10 DIAGNOSIS — G4733 Obstructive sleep apnea (adult) (pediatric): Secondary | ICD-10-CM | POA: Diagnosis not present

## 2021-04-10 DIAGNOSIS — I2510 Atherosclerotic heart disease of native coronary artery without angina pectoris: Secondary | ICD-10-CM | POA: Diagnosis not present

## 2021-04-23 ENCOUNTER — Other Ambulatory Visit: Payer: Self-pay

## 2021-04-23 DIAGNOSIS — I83899 Varicose veins of unspecified lower extremities with other complications: Secondary | ICD-10-CM

## 2021-04-24 ENCOUNTER — Other Ambulatory Visit: Payer: Self-pay

## 2021-04-24 ENCOUNTER — Ambulatory Visit (HOSPITAL_COMMUNITY)
Admission: RE | Admit: 2021-04-24 | Discharge: 2021-04-24 | Disposition: A | Discharge status: Home / Self Care | Payer: Medicare Other | Source: Ambulatory Visit | Attending: Cardiology | Admitting: Cardiology

## 2021-04-24 DIAGNOSIS — I2510 Atherosclerotic heart disease of native coronary artery without angina pectoris: Secondary | ICD-10-CM

## 2021-04-24 MED ORDER — PERFLUTREN LIPID MICROSPHERE
1.0000 mL | INTRAVENOUS | Status: AC | PRN
  Administered 2021-04-24: 09:00:00 4 mL via INTRAVENOUS
  Filled 2021-04-24: qty 10

## 2021-04-24 NOTE — Progress Notes (Signed)
*  PRELIMINARY RESULTS* Echocardiogram Limited 2-D Echocardiogram  has been performed with [facility].  Samuel Germany 04/24/2021, 10:21 AM

## 2021-05-22 ENCOUNTER — Encounter: Payer: Self-pay | Admitting: Physician Assistant

## 2021-05-22 ENCOUNTER — Ambulatory Visit (HOSPITAL_COMMUNITY)
Admission: RE | Admit: 2021-05-22 | Discharge: 2021-05-22 | Disposition: A | Discharge status: Home / Self Care | Payer: Medicare Other | Source: Ambulatory Visit | Attending: Vascular Surgery | Admitting: Vascular Surgery

## 2021-05-22 ENCOUNTER — Other Ambulatory Visit: Payer: Self-pay

## 2021-05-22 ENCOUNTER — Ambulatory Visit: Payer: Medicare Other | Admitting: Physician Assistant

## 2021-05-22 VITALS — BP 130/80 | HR 53 | Temp 98.4°F | Resp 20 | Ht 66.0 in | Wt 241.2 lb

## 2021-05-22 DIAGNOSIS — I872 Venous insufficiency (chronic) (peripheral): Secondary | ICD-10-CM | POA: Diagnosis not present

## 2021-05-22 DIAGNOSIS — M7989 Other specified soft tissue disorders: Secondary | ICD-10-CM

## 2021-05-22 DIAGNOSIS — M109 Gout, unspecified: Secondary | ICD-10-CM | POA: Insufficient documentation

## 2021-05-22 DIAGNOSIS — I83892 Varicose veins of left lower extremities with other complications: Secondary | ICD-10-CM | POA: Diagnosis not present

## 2021-05-22 DIAGNOSIS — I83899 Varicose veins of unspecified lower extremities with other complications: Secondary | ICD-10-CM | POA: Diagnosis not present

## 2021-05-22 NOTE — Progress Notes (Signed)
[facility]   Reason for referral: Swollen bilateral LE with skin discoloration   History of Present Illness  Martin [name] is a 65 y.o. male who presents with chief complaint: swollen leg.  Patient notes, onset of swelling 5+ years ago, associated with left side weakness post stroke and prolonged sitting and standing.  The patient has had negative history of DVT, no history of varicose vein, no history of venous stasis ulcers, no history of  Lymphedema and positive history of skin changes in lower legs.  There is unknown family history of venous disorders.  The patient has not used compression stockings in the past.  He has evidence of chronic venous insuffiencey with skin discoloration.  He states he doesn't have symptoms of claudication, rest pain or non healing wounds.  He has a history of CVA with left sided weakness.    Past Medical History:  Diagnosis Date   Atrial fibrillation ([place])    a. dx 10/2017. b. s/p DCCV on 11/19/2017 with successful conversion to NSR but back in AFIB 3 weeks later   CAD (coronary artery disease)    a. s/p PCI to LAD 2001. b. s/p stenting to the RCA and mid LAD December 2011, residual distal RCA disease treated medically. c.  Multivessel disease status post CABG October 2020   Carotid artery disease ([place])    Essential hypertension    GERD (gastroesophageal reflux disease)    Habitual alcohol use    Hyperlipidemia    Low back pain    Myocardial infarction ([place]) 2020   OSA on CPAP    Osteoarthritis    Polysubstance abuse ([place])    History of marijuana and Vicodin abuse   PVC's (premature ventricular contractions)    Seasonal allergies    Stroke [facility]) 2020    Past Surgical History:  Procedure Laterality Date   CARDIOVERSION N/A 11/19/2017   Procedure: CARDIOVERSION;  Surgeon: Herminio Commons, MD;  [facility];  Service: Cardiovascular;  Laterality: N/A;   COLONOSCOPY WITH PROPOFOL N/A 03/01/2020    Procedure: COLONOSCOPY WITH PROPOFOL;  Surgeon: Harvel Quale, MD;  [facility];  Service: Gastroenterology;  Laterality: N/A;  730   CORONARY ANGIOPLASTY WITH STENT PLACEMENT  2011   CORONARY ARTERY BYPASS GRAFT N/A 03/10/2019   Procedure: CORONARY ARTERY BYPASS GRAFTING (CABG)X 3 Using Left Internal Mammary Artery and Right Saphenous Vein Grafts;  Surgeon: Ivin Poot, MD;  Location: [place];  Service: Open Heart Surgery;  Laterality: N/A;   ESOPHAGOGASTRODUODENOSCOPY (EGD) WITH PROPOFOL N/A 03/01/2020   Procedure: ESOPHAGOGASTRODUODENOSCOPY (EGD) WITH PROPOFOL;  Surgeon: Harvel Quale, MD;  [facility];  Service: Gastroenterology;  Laterality: N/A;   LEFT HEART CATH AND CORONARY ANGIOGRAPHY N/A 03/08/2019   Procedure: LEFT HEART CATH AND CORONARY ANGIOGRAPHY;  Surgeon: Belva Crome, MD;  Location: [facility];  Service: Cardiovascular;  Laterality: N/A;   POLYPECTOMY  03/01/2020   Procedure: POLYPECTOMY;  Surgeon: Harvel Quale, MD;  [facility];  Service: Gastroenterology;;   TEE WITHOUT CARDIOVERSION N/A 03/10/2019   Procedure: TRANSESOPHAGEAL ECHOCARDIOGRAM (TEE);  Surgeon: Prescott Gum, Collier Salina, MD;  Location: [place];  Service: Open Heart Surgery;  Laterality: N/A;   TONSILLECTOMY      Social History   Socioeconomic History   Marital status: Married    Spouse name: Not on file   Number of children: 1   Years of education: Not on file  Highest education level: 9th grade  Occupational History    Employer: UNEMPLOYED    Comment: works Social worker employed  Tobacco Use   Smoking status: Former    Packs/day: 1.00    Years: 35.00    Pack years: 35.00    Types: Cigarettes    Start date: 09/18/1967    Quit date: 06/09/1999    Years since quitting: 21.9   Smokeless tobacco: Never  Vaping Use   Vaping Use: Never used  Substance and Sexual Activity   Alcohol use: Not Currently    Alcohol/week: 0.0  standard drinks    Comment: .stopped October on 2020   Drug use: Yes    Types: Marijuana    Comment: last smoked 2 months ago   Sexual activity: Not on file  Other Topics Concern   Not on file  Social History Narrative   No exercise   Pt lives with spouse in 1 story home   Right handed   Drinks coffee every morning, not tea, no soda   One son high level of education is 9th grade   Social Determinants of Health   Financial Resource Strain: Not on file  Food Insecurity: Not on file  Transportation Needs: Not on file  Physical Activity: Not on file  Stress: Not on file  Social Connections: Not on file  Intimate Partner Violence: Not on file    Family History  Problem Relation Age of Onset   Stroke Mother    Other Mother        small bowel obstruction   Heart attack Father    Heart attack Brother    Healthy Son     Current Outpatient Medications on File Prior to Visit  Medication Sig Dispense Refill   acetaminophen (TYLENOL) 325 MG tablet Take 1-2 tablets (325-650 mg total) by mouth every 4 (four) hours as needed for mild pain.     allopurinol (ZYLOPRIM) 300 MG tablet Take 300 mg by mouth daily.      atorvastatin (LIPITOR) 80 MG tablet TAKE 1 TABLET BY MOUTH EVERY DAY 90 tablet 1   Cholecalciferol (VITAMIN D-3) 1000 UNITS CAPS Take 1,000 Units by mouth every morning.      colchicine 0.6 MG tablet Take 1 tablet (0.6 mg total) by mouth daily at 6 PM. 30 tablet 1   Cyanocobalamin (VITAMIN B-12) 2500 MCG SUBL Place 2,500 mcg under the tongue every morning.      diclofenac Sodium (VOLTAREN) 1 % GEL SMARTSIG:0.5 Sparingly Topical Twice Daily     docusate sodium (COLACE) 100 MG capsule Take 2 capsules (200 mg total) by mouth daily. (Patient taking differently: Take 100 mg by mouth 2 (two) times daily.) 60 capsule 0   ELIQUIS 5 MG TABS tablet TAKE 1 TABLET BY MOUTH TWICE A DAY (Patient taking differently: Take 5 mg by mouth 2 (two) times daily.) 180 tablet 2   fluticasone (FLONASE)  50 MCG/ACT nasal spray Place 2 sprays into the nose daily.     folic acid (FOLVITE) 1 MG tablet TAKE ONE TABLET BY MOUTH EVERY DAY (Patient taking differently: Take 1 mg by mouth daily.) 30 tablet 6   furosemide (LASIX) 40 MG tablet Take 1 tablet (40 mg total) by mouth daily. 90 tablet 3   HYDROcodone-acetaminophen (NORCO) 10-325 MG tablet Take 1 tablet by mouth every 6 (six) hours as needed.     KLOR-CON M15 15 MEQ tablet TAKE 2 TABLETS (30 MEQ TOTAL) BY MOUTH DAILY. 180 tablet 3   lisinopril (  ZESTRIL) 10 MG tablet TAKE 1 TABLET BY MOUTH EVERY DAY 90 tablet 3   loratadine (CLARITIN) 10 MG tablet Take 10 mg by mouth daily.     LORazepam (ATIVAN) 0.5 MG tablet Take 0.5 mg by mouth at bedtime.      meclizine (ANTIVERT) 32 MG tablet Take 32 mg by mouth daily as needed for dizziness.      metoprolol tartrate (LOPRESSOR) 25 MG tablet      nitroGLYCERIN (NITROSTAT) 0.4 MG SL tablet Place 1 tablet (0.4 mg total) under the tongue every 5 (five) minutes as needed for chest pain. 25 tablet 3   pantoprazole (PROTONIX) 40 MG tablet Take 1 tablet (40 mg total) by mouth daily. (Patient taking differently: Take 40 mg by mouth at bedtime.) 30 tablet 0   polyethylene glycol (MIRALAX / GLYCOLAX) packet Take 17 g by mouth at bedtime as needed for mild constipation.     tamsulosin (FLOMAX) 0.4 MG CAPS capsule Take 1 capsule (0.4 mg total) by mouth 2 (two) times daily. 60 capsule 0   thiamine 100 MG tablet Take 1 tablet (100 mg total) by mouth daily. 30 tablet 0   traZODone (DESYREL) 100 MG tablet TAKE 1 TABLET (100 MG TOTAL) BY MOUTH AT BEDTIME AS NEEDED FOR SLEEP. 90 tablet 1   No current facility-administered medications on file prior to visit.    Allergies as of 05/22/2021 - Review Complete 05/22/2021  Allergen Reaction Noted   Penicillins Hives and Rash      ROS:   General:  No weight loss, Fever, chills  HEENT: No recent headaches, no nasal bleeding, no visual changes, no sore throat  Neurologic: No  dizziness, blackouts, seizures. No recent symptoms of stroke or mini- stroke. No recent episodes of slurred speech, or temporary blindness.  Cardiac: No recent episodes of chest pain/pressure, no shortness of breath at rest.  No shortness of breath with exertion.  Denies history of atrial fibrillation or irregular heartbeat  Vascular: No history of rest pain in feet.  No history of claudication.  No history of non-healing ulcer, No history of DVT   Pulmonary: No home oxygen, no productive cough, no hemoptysis,  No asthma or wheezing  Musculoskeletal:  [ x] Arthritis, [ ]  Low back pain,  [ ]  Joint pain  Hematologic:No history of hypercoagulable state.  No history of easy bleeding.  No history of anemia  Gastrointestinal: No hematochezia or melena,  No gastroesophageal reflux, no trouble swallowing  Urinary: [ ]  chronic Kidney disease, [ ]  on HD - [ ]  MWF or [ ]  TTHS, [ ]  Burning with urination, [ ]  Frequent urination, [ ]  Difficulty urinating;   Skin: No rashes  Psychological: No history of anxiety,  No history of depression  Physical Examination  Vitals:   05/22/21 0836  BP: 130/80  Pulse: (!) 53  Resp: 20  Temp: 98.4 F (36.9 C)  TempSrc: Temporal  SpO2: 97%  Weight: 241 lb 3.2 oz (109.4 kg)  Height: 5\' 6"  (1.676 m)    Body mass index is 38.93 kg/m.  General:  Alert and oriented, no acute distress HEENT: Normal Neck: No bruit or JVD Pulmonary: Clear to auscultation bilaterally Cardiac: Regular Rate and Rhythm without murmur Abdomen: Soft, non-tender, non-distended, no mass, no scars Skin: No rash   Extremity Pulses:  2+ radial, brachial, femoral, dorsalis pedis, posterior tibial pulses bilaterally Musculoskeletal: No deformity or edema  Neurologic: Upper and lower extremity motor grossly intact left LE weakness and decreased fine motor  DATA:    +--------------+---------+------+-----------+------------+----------------+    LEFT           Reflux No Reflux Reflux  Time Diameter cms Comments                                        Yes                                                 +--------------+---------+------+-----------+------------+----------------+    CFV                       yes    >1 second                                    +--------------+---------+------+-----------+------------+----------------+    FV mid         no                                                             +--------------+---------+------+-----------+------------+----------------+    Popliteal      no                                                             +--------------+---------+------+-----------+------------+----------------+    GSV at SFJ                yes     >500 ms      0.776                          +--------------+---------+------+-----------+------------+----------------+    GSV prox thigh no                               0.48                          +--------------+---------+------+-----------+------------+----------------+    GSV mid thigh  no                              0.389                          +--------------+---------+------+-----------+------------+----------------+    GSV dist thigh no                              0.301                          +--------------+---------+------+-----------+------------+----------------+    GSV at knee    no  0.292                          +--------------+---------+------+-----------+------------+----------------+    GSV prox calf  no                              0.288                          +--------------+---------+------+-----------+------------+----------------+    SSV Pop Fossa  no                               0.5      chronic  thrombus   +--------------+---------+------+-----------+------------+----------------+    SSV prox calf  no                               0.27     chronic  thrombus    +--------------+---------+------+-----------+------------+----------------+    SSV mid calf   no                              0.201     chronic  thrombus   +--------------+---------+------+-----------+------------+----------------+   Summary:  Left:  - No evidence of deep vein thrombosis seen in the left lower extremity,  from the common femoral through the popliteal veins.  - Chronic, non occlusive thrombus in the small saphenous vein.  - Deep vein reflux in the common femoral vein.  - Superficial vein reflux in the sapheno-femoral junction.     Assessment/Plan: Chronic venous reflux with post thrombotic reflux.   Chronic venous insufficiency with evidence of chronic non occlusive thrombus in the small saphenous vein.  He denise wounds, skin weeping, or pain in B LE. He would benefit from daily compression and elevation with a walking exercise program as tolerates.  This will prevent further skin damage  He has good arterial flow with palpable pedal pulses and is not at risk of limb loss.  He will f/u as needed if he develops worsening symptoms.  No vascular intervention is recommended.    Roxy Horseman PA-C [facility] [place] Office: [phone]  MD in clinic [place]

## 2021-06-06 DIAGNOSIS — I10 Essential (primary) hypertension: Secondary | ICD-10-CM | POA: Diagnosis not present

## 2021-06-06 DIAGNOSIS — E782 Mixed hyperlipidemia: Secondary | ICD-10-CM | POA: Diagnosis not present

## 2021-06-08 ENCOUNTER — Other Ambulatory Visit: Payer: Self-pay | Admitting: Student

## 2021-06-12 DIAGNOSIS — M109 Gout, unspecified: Secondary | ICD-10-CM | POA: Diagnosis not present

## 2021-07-09 DIAGNOSIS — G4733 Obstructive sleep apnea (adult) (pediatric): Secondary | ICD-10-CM | POA: Diagnosis not present

## 2021-07-11 DIAGNOSIS — T148XXA Other injury of unspecified body region, initial encounter: Secondary | ICD-10-CM | POA: Diagnosis not present

## 2021-07-11 DIAGNOSIS — M545 Low back pain, unspecified: Secondary | ICD-10-CM | POA: Diagnosis not present

## 2021-07-17 ENCOUNTER — Ambulatory Visit: Payer: Medicare Other | Admitting: Pulmonary Disease

## 2021-07-17 ENCOUNTER — Other Ambulatory Visit: Payer: Self-pay

## 2021-07-17 ENCOUNTER — Encounter: Payer: Self-pay | Admitting: Pulmonary Disease

## 2021-07-17 VITALS — BP 142/90 | HR 72 | Temp 98.4°F | Ht 66.0 in | Wt 245.1 lb

## 2021-07-17 DIAGNOSIS — I10 Essential (primary) hypertension: Secondary | ICD-10-CM | POA: Diagnosis not present

## 2021-07-17 DIAGNOSIS — Z9989 Dependence on other enabling machines and devices: Secondary | ICD-10-CM

## 2021-07-17 DIAGNOSIS — G4733 Obstructive sleep apnea (adult) (pediatric): Secondary | ICD-10-CM

## 2021-07-17 NOTE — Patient Instructions (Signed)
CPAP is working better [employer] will be renewed x 1 year

## 2021-07-17 NOTE — Assessment & Plan Note (Signed)
Blood pressure slight high today.  He will take his medications. Cardiovascular benefits of treatment of OSA was discussed

## 2021-07-17 NOTE — Progress Notes (Signed)
° ° °  Subjective:    Patient ID: Martin [name], male    DOB: [birth year], 66 y.o.   MRN: [id number]  HPI  66 yo obese man with CAD for FU of obstructive sleep apnea. He had a  sleep study in 2001 when he had a heart attack and maintained on CPAP since then.  He got a new CPAP in 2017 12 cm   PMH - CABG 2020 ,CVA Wife had ALS , passed 06/2021  Chief Complaint  Patient presents with   Follow-up    CPAP working well overall. Having some trouble with mask.    Unfortunately his wife passed away 1 month ago and he is still grieving. On his last visit/download showed residual AHI of 12/hour and large leak.  He underwent past desensitization and was fitted with a fullface mask but he did not like this much and is reverted to nasal pillows He reports that he is resting well denies daytime somnolence or fatigue  Significant tests/ events reviewed  NPSG 04/2016 at [facility] showed AHI of 29/hour with lowest desaturation of 86%  Review of Systems neg for any significant sore throat, dysphagia, itching, sneezing, nasal congestion or excess/ purulent secretions, fever, chills, sweats, unintended wt loss, pleuritic or exertional cp, hempoptysis, orthopnea pnd or change in chronic leg swelling. Also denies presyncope, palpitations, heartburn, abdominal pain, nausea, vomiting, diarrhea or change in bowel or urinary habits, dysuria,hematuria, rash, arthralgias, visual complaints, headache, numbness weakness or ataxia.     Objective:   Physical Exam  Gen. Pleasant, obese, in no distress ENT - no lesions, no post nasal drip Neck: No JVD, no thyromegaly, no carotid bruits Lungs: no use of accessory muscles, no dullness to percussion, decreased without rales or rhonchi  Cardiovascular: Rhythm regular, heart sounds  normal, no murmurs or gallops, no peripheral edema Musculoskeletal: No deformities, no cyanosis or clubbing , no tremors       Assessment & Plan:

## 2021-07-17 NOTE — Assessment & Plan Note (Signed)
CPAP download was reviewed which shows residual AHI of 3/hour which is improved from last download.  He still has a large leak but that is acceptable. We will continue CPAP at 12 cm he is very compliant and CPAP is only helped improve his daytime somnolence and fatigue.  CPAP supplies will be renewed for a year  Weight loss encouraged, compliance with goal of at least 4-6 hrs every night is the expectation. Advised against medications with sedative side effects Cautioned against driving when sleepy - understanding that sleepiness will vary on a day to day basis

## 2021-07-31 DIAGNOSIS — E782 Mixed hyperlipidemia: Secondary | ICD-10-CM | POA: Diagnosis not present

## 2021-07-31 DIAGNOSIS — D509 Iron deficiency anemia, unspecified: Secondary | ICD-10-CM | POA: Diagnosis not present

## 2021-07-31 DIAGNOSIS — R7301 Impaired fasting glucose: Secondary | ICD-10-CM | POA: Diagnosis not present

## 2021-08-03 NOTE — Progress Notes (Signed)
Cardiology Office Note  Date: 08/04/2021   ID: Zi, [place] January 07, 1956, MRN [id number]  PCP:  [name], MD  Cardiologist:  Rozann Lesches, MD Electrophysiologist:  [name], MD   Chief Complaint  Patient presents with   Cardiac follow-up    History of Present Illness: Martin [name] is a 66 y.o. male last seen in August 2022.  He is here for a follow-up visit.  He tells me that his wife passed away with ALS in the last few months.  They had been married for 44 years.  He is still obviously grieving.  No new cardiac symptoms during this time, he is taking his medications regularly.  He does not describe any angina symptoms or nitroglycerin use, generally NYHA class II-III dyspnea, unchanged.  No palpitations or syncope.  Follow-up echocardiogram after last visit showed only trivial pericardial effusion, improved from moderate sized.  LVEF low normal range at 50 to 55%.  He had lab work today and follow-up pending with Dr. Nevada Crane.  Past Medical History:  Diagnosis Date   Atrial fibrillation ([place])    a. dx 10/2017. b. s/p DCCV on 11/19/2017 with successful conversion to NSR but back in AFIB 3 weeks later   CAD (coronary artery disease)    a. s/p PCI to LAD 2001. b. s/p stenting to the RCA and mid LAD December 2011, residual distal RCA disease treated medically. c.  Multivessel disease status post CABG October 2020   Carotid artery disease ([place])    Essential hypertension    GERD (gastroesophageal reflux disease)    Habitual alcohol use    Hyperlipidemia    Low back pain    Myocardial infarction ([place]) 2020   OSA on CPAP    Osteoarthritis    Polysubstance abuse ([place])    History of marijuana and Vicodin abuse   PVC's (premature ventricular contractions)    Seasonal allergies    Stroke [facility]) 2020    Past Surgical History:  Procedure Laterality Date   CARDIOVERSION N/A 11/19/2017   Procedure: CARDIOVERSION;  Surgeon: Herminio Commons, MD;  [facility];  Service: Cardiovascular;  Laterality: N/A;   COLONOSCOPY WITH PROPOFOL N/A 03/01/2020   Procedure: COLONOSCOPY WITH PROPOFOL;  Surgeon: Harvel Quale, MD;  [facility];  Service: Gastroenterology;  Laterality: N/A;  730   CORONARY ANGIOPLASTY WITH STENT PLACEMENT  2011   CORONARY ARTERY BYPASS GRAFT N/A 03/10/2019   Procedure: CORONARY ARTERY BYPASS GRAFTING (CABG)X 3 Using Left Internal Mammary Artery and Right Saphenous Vein Grafts;  Surgeon: Ivin Poot, MD;  Location: [place];  Service: Open Heart Surgery;  Laterality: N/A;   ESOPHAGOGASTRODUODENOSCOPY (EGD) WITH PROPOFOL N/A 03/01/2020   Procedure: ESOPHAGOGASTRODUODENOSCOPY (EGD) WITH PROPOFOL;  Surgeon: Harvel Quale, MD;  [facility];  Service: Gastroenterology;  Laterality: N/A;   LEFT HEART CATH AND CORONARY ANGIOGRAPHY N/A 03/08/2019   Procedure: LEFT HEART CATH AND CORONARY ANGIOGRAPHY;  Surgeon: Belva Crome, MD;  Location: [place] CV LAB;  Service: Cardiovascular;  Laterality: N/A;   POLYPECTOMY  03/01/2020   Procedure: POLYPECTOMY;  Surgeon: Harvel Quale, MD;  [facility];  Service: Gastroenterology;;   TEE WITHOUT CARDIOVERSION N/A 03/10/2019   Procedure: TRANSESOPHAGEAL ECHOCARDIOGRAM (TEE);  Surgeon: Prescott Gum, Collier Salina, MD;  Location: [place];  Service: Open Heart Surgery;  Laterality: N/A;   TONSILLECTOMY      Current Outpatient Medications  Medication Sig Dispense Refill  allopurinol (ZYLOPRIM) 300 MG tablet Take 300 mg by mouth daily.      atorvastatin (LIPITOR) 80 MG tablet TAKE 1 TABLET BY MOUTH EVERY DAY 90 tablet 1   Cholecalciferol (VITAMIN D-3) 1000 UNITS CAPS Take 1,000 Units by mouth every morning.      colchicine 0.6 MG tablet Take 1 tablet (0.6 mg total) by mouth daily at 6 PM. 30 tablet 1   Cyanocobalamin (VITAMIN B-12) 2500 MCG SUBL Place 2,500 mcg under the tongue every morning.      diclofenac Sodium (VOLTAREN) 1 % GEL  SMARTSIG:0.5 Sparingly Topical Twice Daily     docusate sodium (COLACE) 100 MG capsule Take 2 capsules (200 mg total) by mouth daily. (Patient taking differently: Take 100 mg by mouth 2 (two) times daily.) 60 capsule 0   ELIQUIS 5 MG TABS tablet TAKE 1 TABLET BY MOUTH TWICE A DAY (Patient taking differently: Take 5 mg by mouth 2 (two) times daily.) 180 tablet 2   fluticasone (FLONASE) 50 MCG/ACT nasal spray Place 2 sprays into the nose daily.     folic acid (FOLVITE) 1 MG tablet TAKE ONE TABLET BY MOUTH EVERY DAY (Patient taking differently: Take 1 mg by mouth daily.) 30 tablet 6   furosemide (LASIX) 40 MG tablet Take 1 tablet (40 mg total) by mouth daily. 90 tablet 3   HYDROcodone-acetaminophen (NORCO) 10-325 MG tablet Take 1 tablet by mouth every 6 (six) hours as needed.     KLOR-CON M15 15 MEQ tablet TAKE 2 TABLETS BY MOUTH DAILY 180 tablet 3   lisinopril (ZESTRIL) 10 MG tablet TAKE 1 TABLET BY MOUTH EVERY DAY 90 tablet 3   loratadine (CLARITIN) 10 MG tablet Take 10 mg by mouth daily.     LORazepam (ATIVAN) 0.5 MG tablet Take 0.5 mg by mouth at bedtime.      meclizine (ANTIVERT) 32 MG tablet Take 32 mg by mouth daily as needed for dizziness.      nitroGLYCERIN (NITROSTAT) 0.4 MG SL tablet Place 1 tablet (0.4 mg total) under the tongue every 5 (five) minutes as needed for chest pain. 25 tablet 3   pantoprazole (PROTONIX) 40 MG tablet Take 1 tablet (40 mg total) by mouth daily. (Patient taking differently: Take 40 mg by mouth at bedtime.) 30 tablet 0   polyethylene glycol (MIRALAX / GLYCOLAX) packet Take 17 g by mouth at bedtime as needed for mild constipation.     tamsulosin (FLOMAX) 0.4 MG CAPS capsule Take 1 capsule (0.4 mg total) by mouth 2 (two) times daily. 60 capsule 0   No current facility-administered medications for this visit.   Allergies:  Penicillins   ROS: No orthopnea or PND.  Physical Exam: VS:  BP 138/70    Pulse 65    Ht 5\' 6"  (1.676 m)    Wt 242 lb 6.4 oz (110 kg)    SpO2  95%    BMI 39.12 kg/m , BMI Body mass index is 39.12 kg/m.  Wt Readings from Last 3 Encounters:  08/04/21 242 lb 6.4 oz (110 kg)  07/17/21 245 lb 1.3 oz (111.2 kg)  05/22/21 241 lb 3.2 oz (109.4 kg)    General: Patient appears comfortable at rest. HEENT: Conjunctiva and lids normal, wearing a mask. Neck: Supple, no elevated JVP or carotid bruits, no thyromegaly. Lungs: Clear to auscultation, nonlabored breathing at rest. Cardiac: Irregularly irregular, no S3 or significant systolic murmur, no pericardial rub. Extremities: Stable lower leg edema and venous stasis.  ECG:  An ECG  dated 01/31/2021 was personally reviewed today and demonstrated:  Atrial fibrillation at 46 bpm with PVCs and nonspecific ST-T wave changes, IVCD.  Recent Labwork:  August 2022: Hemoglobin A1c 5.8%, cholesterol 108, triglycerides 50, HDL 52, LDL 44, BUN 13, creatinine 0.7, potassium 4.0, AST 21, ALT 15, hemoglobin 12.3, platelets 145  Other Studies Reviewed Today:  Echocardiogram 03/13/2021:  1. Endocardium is poorly visualized, difficult assessment of LV systolic  function. Grossly LVEF is probably in the 45-55% range. Recommend limited  study with echocontrast to better evaluate. . Left ventricular ejection  fraction, by estimation, is  45-55%. The left ventricle has mildly decreased to low normal function.  Left ventricular endocardial border not optimally defined to evaluate  regional wall motion. The left ventricular internal cavity size was mildly  to moderately dilated. Left  ventricular diastolic parameters are indeterminate.   2. Right ventricular systolic function is normal. The right ventricular  size is normal. There is normal pulmonary artery systolic pressure.   3. Left atrial size was severely dilated.   4. Right atrial size was mildly dilated.   5. The pericardial effusion is circumferential.   6. The mitral valve is normal in structure. No evidence of mitral valve  regurgitation. No  evidence of mitral stenosis.   7. The aortic valve has an indeterminant number of cusps. There is mild  calcification of the aortic valve. There is mild thickening of the aortic  valve. Aortic valve regurgitation is mild. Mild aortic valve stenosis.   8. The inferior vena cava is normal in size with greater than 50%  respiratory variability, suggesting right atrial pressure of 3 mmHg.   Echocardiogram 04/24/2021:  1. Limited study with Definity contrast.   2. Left ventricular ejection fraction, by estimation, is 50 to 55%. The  left ventricle has low normal function. The left ventricle demonstrates  regional wall motion abnormalities (see scoring diagram/findings for  description).   3. Right ventricular systolic function is low normal. The right  ventricular size is normal.   Assessment and Plan:  1.  Permanent atrial fibrillation with CHA2DS2-VASc score of 4.  He reports no sense of palpitations and remains on Eliquis for stroke prophylaxis.  Heart rate controlled in the absence of AV nodal blockers.  He was taken off Lopressor due to bradycardia.  2.  CAD status post CABG in October 2020.  No active angina symptoms at this time.  Continue Norvasc, lisinopril, and Lipitor.  He is no longer on aspirin given use of Eliquis.  3.  Mixed hyperlipidemia, doing well on Lipitor with last LDL 44.  4.  HFpEF, LVEF 50 to 55% by most recent assessment.  He continues on Lasix with potassium supplement.  Medication Adjustments/Labs and Tests Ordered: Current medicines are reviewed at length with the patient today.  Concerns regarding medicines are outlined above.   Tests Ordered: No orders of the defined types were placed in this encounter.   Medication Changes: No orders of the defined types were placed in this encounter.   Disposition:  Follow up  6 months.  Signed, Satira Sark, MD, [facility] 08/04/2021 10:11 AM    [place] at [place],  [place], [place] [address] Phone: [phone]; Fax: [phone]

## 2021-08-04 ENCOUNTER — Other Ambulatory Visit: Payer: Self-pay

## 2021-08-04 ENCOUNTER — Encounter: Payer: Self-pay | Admitting: Cardiology

## 2021-08-04 ENCOUNTER — Ambulatory Visit: Payer: Medicare Other | Admitting: Cardiology

## 2021-08-04 VITALS — BP 138/70 | HR 65 | Ht 66.0 in | Wt 242.4 lb

## 2021-08-04 DIAGNOSIS — G4733 Obstructive sleep apnea (adult) (pediatric): Secondary | ICD-10-CM | POA: Diagnosis not present

## 2021-08-04 DIAGNOSIS — I5032 Chronic diastolic (congestive) heart failure: Secondary | ICD-10-CM

## 2021-08-04 DIAGNOSIS — I4821 Permanent atrial fibrillation: Secondary | ICD-10-CM

## 2021-08-04 DIAGNOSIS — I2510 Atherosclerotic heart disease of native coronary artery without angina pectoris: Secondary | ICD-10-CM | POA: Diagnosis not present

## 2021-08-04 DIAGNOSIS — I25119 Atherosclerotic heart disease of native coronary artery with unspecified angina pectoris: Secondary | ICD-10-CM

## 2021-08-04 DIAGNOSIS — E782 Mixed hyperlipidemia: Secondary | ICD-10-CM | POA: Diagnosis not present

## 2021-08-04 DIAGNOSIS — K922 Gastrointestinal hemorrhage, unspecified: Secondary | ICD-10-CM | POA: Diagnosis not present

## 2021-08-04 DIAGNOSIS — D509 Iron deficiency anemia, unspecified: Secondary | ICD-10-CM | POA: Diagnosis not present

## 2021-08-04 DIAGNOSIS — I4891 Unspecified atrial fibrillation: Secondary | ICD-10-CM | POA: Diagnosis not present

## 2021-08-04 DIAGNOSIS — D6949 Other primary thrombocytopenia: Secondary | ICD-10-CM | POA: Diagnosis not present

## 2021-08-04 DIAGNOSIS — I110 Hypertensive heart disease with heart failure: Secondary | ICD-10-CM | POA: Diagnosis not present

## 2021-08-04 DIAGNOSIS — I10 Essential (primary) hypertension: Secondary | ICD-10-CM | POA: Diagnosis not present

## 2021-08-04 DIAGNOSIS — R7301 Impaired fasting glucose: Secondary | ICD-10-CM | POA: Diagnosis not present

## 2021-08-04 NOTE — Patient Instructions (Signed)
Medication Instructions:  Continue all current medications.   Labwork: none  Testing/Procedures: none  Follow-Up: 6 months   Any Other Special Instructions Will Be Listed Below (If Applicable).   If you need a refill on your cardiac medications before your next appointment, please call your pharmacy.  

## 2021-08-26 ENCOUNTER — Ambulatory Visit: Payer: Medicare Other | Admitting: Cardiology

## 2021-09-08 ENCOUNTER — Telehealth: Payer: Self-pay

## 2021-09-08 MED ORDER — ATORVASTATIN CALCIUM 80 MG PO TABS: 80.0000 mg | ORAL_TABLET | Freq: Every day | ORAL | 1 refills | Status: DC

## 2021-09-08 MED ORDER — LISINOPRIL 10 MG PO TABS: 10.0000 mg | ORAL_TABLET | Freq: Every day | ORAL | 1 refills | Status: DC

## 2021-09-08 NOTE — Telephone Encounter (Signed)
Medication refill request for Lisinopril 10 mg tablets and Atorvastatin 80 mg tablets approved and sent to [facility].  ?

## 2021-09-16 ENCOUNTER — Other Ambulatory Visit (HOSPITAL_COMMUNITY): Payer: Self-pay | Admitting: Family Medicine

## 2021-09-16 ENCOUNTER — Ambulatory Visit (HOSPITAL_COMMUNITY)
Admission: RE | Admit: 2021-09-16 | Discharge: 2021-09-16 | Disposition: A | Discharge status: Home / Self Care | Payer: Medicare Other | Source: Ambulatory Visit | Attending: Family Medicine | Admitting: Family Medicine

## 2021-09-16 DIAGNOSIS — R0781 Pleurodynia: Secondary | ICD-10-CM

## 2021-09-16 DIAGNOSIS — T148XXA Other injury of unspecified body region, initial encounter: Secondary | ICD-10-CM | POA: Diagnosis not present

## 2021-09-16 DIAGNOSIS — I517 Cardiomegaly: Secondary | ICD-10-CM | POA: Diagnosis not present

## 2021-10-07 DIAGNOSIS — G4733 Obstructive sleep apnea (adult) (pediatric): Secondary | ICD-10-CM | POA: Diagnosis not present

## 2021-11-07 ENCOUNTER — Telehealth: Payer: Self-pay | Admitting: Cardiology

## 2021-11-07 DIAGNOSIS — R0609 Other forms of dyspnea: Secondary | ICD-10-CM

## 2021-11-07 NOTE — Telephone Encounter (Signed)
Pt c/o Shortness Of Breath: STAT if SOB developed within the last 24 hours or pt is noticeably SOB on the phone  1. Are you currently SOB (can you hear that pt is SOB on the phone)? NO  2. How long have you been experiencing SOB? About the last week or so  3. Are you SOB when sitting or when up moving around? Most of the time when he is moving around.  Sometimes when he is sitting.   4. Are you currently experiencing any other symptoms? NO

## 2021-11-07 NOTE — Telephone Encounter (Signed)
Patient notified and verbalized understanding.  He agrees to doing stress test.  Order entered & sent to [facility] for scheduling.  Instructions given to hold his Lasix the morning of test - nothing to eat or drink 6 hours prior to test.

## 2021-11-07 NOTE — Telephone Encounter (Signed)
Patient returning call.

## 2021-11-07 NOTE — Telephone Encounter (Signed)
Spoke with patient in regards to his c/o SOB.  States this has been going on x 1 week or so.  Mostly noticing with exertion, but could happen when he is just sitting still also.  BP 126/70  70  No other c/o cough, heart racing or chest pain.  No weight gain, if any thinks he may have lost some weight.  Is still dealing with the loss of his wife, passed 4-5 months ago.  Echo stable November 2022.  Taking all medications as prescribed.

## 2021-11-07 NOTE — Telephone Encounter (Signed)
No answer

## 2021-11-10 ENCOUNTER — Telehealth: Payer: Self-pay | Admitting: Cardiology

## 2021-11-10 NOTE — Telephone Encounter (Signed)
Checking percert on the following patient for testing scheduled at [facility].    LEXISCAN MYOVIEW  11/11/2021

## 2021-11-11 ENCOUNTER — Ambulatory Visit (HOSPITAL_COMMUNITY)
Admission: RE | Admit: 2021-11-11 | Discharge: 2021-11-11 | Disposition: A | Discharge status: Home / Self Care | Payer: Medicare Other | Source: Ambulatory Visit | Attending: Cardiology | Admitting: Cardiology

## 2021-11-11 ENCOUNTER — Ambulatory Visit (HOSPITAL_BASED_OUTPATIENT_CLINIC_OR_DEPARTMENT_OTHER)
Admission: RE | Admit: 2021-11-11 | Discharge: 2021-11-11 | Disposition: A | Discharge status: Home / Self Care | Payer: Medicare Other | Source: Ambulatory Visit | Attending: Cardiology | Admitting: Cardiology

## 2021-11-11 DIAGNOSIS — R0609 Other forms of dyspnea: Secondary | ICD-10-CM | POA: Insufficient documentation

## 2021-11-11 LAB — NM MYOCAR MULTI W/SPECT W/WALL MOTION / EF
LV dias vol: 269 mL (ref 62–150)
LV sys vol: 156 mL
Nuc Stress EF: 42 %
Peak HR: 95 {beats}/min
RATE: 0.3
Rest HR: 78 {beats}/min
Rest Nuclear Isotope Dose: 11 mCi
SDS: 4
SRS: 0
SSS: 4
ST Depression (mm): 0 mm
Stress Nuclear Isotope Dose: 30 mCi
TID: 1.01

## 2021-11-11 MED ORDER — SODIUM CHLORIDE FLUSH 0.9 % IV SOLN
INTRAVENOUS | Status: AC
  Administered 2021-11-11: 10 mL via INTRAVENOUS
  Filled 2021-11-11: qty 10

## 2021-11-11 MED ORDER — TECHNETIUM TC 99M TETROFOSMIN IV KIT
30.0000 | PACK | Freq: Once | INTRAVENOUS | Status: AC | PRN
  Administered 2021-11-11: 30 via INTRAVENOUS

## 2021-11-11 MED ORDER — TECHNETIUM TC 99M TETROFOSMIN IV KIT
10.0000 | PACK | Freq: Once | INTRAVENOUS | Status: AC | PRN
  Administered 2021-11-11: 11 via INTRAVENOUS

## 2021-11-11 MED ORDER — REGADENOSON 0.4 MG/5ML IV SOLN
INTRAVENOUS | Status: AC
  Administered 2021-11-11: 0.4 mg via INTRAVENOUS
  Filled 2021-11-11: qty 5

## 2021-11-12 ENCOUNTER — Ambulatory Visit: Payer: Medicare Other | Admitting: Cardiology

## 2021-11-12 ENCOUNTER — Encounter: Payer: Self-pay | Admitting: Cardiology

## 2021-11-12 VITALS — BP 148/82 | HR 84 | Ht 67.0 in | Wt 238.2 lb

## 2021-11-12 DIAGNOSIS — I25119 Atherosclerotic heart disease of native coronary artery with unspecified angina pectoris: Secondary | ICD-10-CM

## 2021-11-12 DIAGNOSIS — I5020 Unspecified systolic (congestive) heart failure: Secondary | ICD-10-CM | POA: Diagnosis not present

## 2021-11-12 DIAGNOSIS — I4821 Permanent atrial fibrillation: Secondary | ICD-10-CM

## 2021-11-12 MED ORDER — SACUBITRIL-VALSARTAN 24-26 MG PO TABS: 1.0000 | ORAL_TABLET | Freq: Two times a day (BID) | ORAL | 5 refills | Status: DC

## 2021-11-12 MED ORDER — ENTRESTO 24-26 MG PO TABS: 1.0000 | ORAL_TABLET | Freq: Two times a day (BID) | ORAL | 0 refills | Status: DC

## 2021-11-12 NOTE — Addendum Note (Signed)
Addended by: Sung Amabile on: 11/12/2021 02:36 PM   Modules accepted: Orders

## 2021-11-12 NOTE — Progress Notes (Signed)
Cardiology Office Note  Date: 11/12/2021   ID: Martin, [name] 09-25-1955, MRN [id number]  PCP:  [name], MD  Cardiologist:  Rozann Lesches, MD Electrophysiologist:  [name], MD   Chief Complaint  Patient presents with   Cardiac follow-up    History of Present Illness: Martin [name] is a 66 y.o. male last seen in February.  He presents for follow-up and review of recent cardiac testing.  Still reports fatigue, dyspnea on exertion, no definite angina.  Also continues to go through the grieving process after the passing of his wife.  This very much seems to still impact him quite a lot.  Lexiscan Myoview showed evidence of inferior infarct scar but no frank ischemia, LVEF 42% and atrial fibrillation.  We discussed the results today.  For now our plan will be to modify medical therapy in stepwise fashion.  He does not report any progressive leg swelling on current dose of Lasix and his weight is actually down about 4 pounds.  Past Medical History:  Diagnosis Date   Atrial fibrillation ([place])    a. dx 10/2017. b. s/p DCCV on 11/19/2017 with successful conversion to NSR but back in AFIB 3 weeks later   CAD (coronary artery disease)    a. s/p PCI to LAD 2001. b. s/p stenting to the RCA and mid LAD December 2011, residual distal RCA disease treated medically. c.  Multivessel disease status post CABG October 2020   Carotid artery disease ([place])    Essential hypertension    GERD (gastroesophageal reflux disease)    Habitual alcohol use    Hyperlipidemia    Low back pain    Myocardial infarction ([place]) 2020   OSA on CPAP    Osteoarthritis    Polysubstance abuse ([place])    History of marijuana and Vicodin abuse   PVC's (premature ventricular contractions)    Seasonal allergies    Stroke [facility]) 2020    Past Surgical History:  Procedure Laterality Date   CARDIOVERSION N/A 11/19/2017   Procedure: CARDIOVERSION;  Surgeon: Herminio Commons, MD;  [facility];  Service: Cardiovascular;  Laterality: N/A;   COLONOSCOPY WITH PROPOFOL N/A 03/01/2020   Procedure: COLONOSCOPY WITH PROPOFOL;  Surgeon: Harvel Quale, MD;  [facility];  Service: Gastroenterology;  Laterality: N/A;  730   CORONARY ANGIOPLASTY WITH STENT PLACEMENT  2011   CORONARY ARTERY BYPASS GRAFT N/A 03/10/2019   Procedure: CORONARY ARTERY BYPASS GRAFTING (CABG)X 3 Using Left Internal Mammary Artery and Right Saphenous Vein Grafts;  Surgeon: Ivin Poot, MD;  Location: [place];  Service: Open Heart Surgery;  Laterality: N/A;   ESOPHAGOGASTRODUODENOSCOPY (EGD) WITH PROPOFOL N/A 03/01/2020   Procedure: ESOPHAGOGASTRODUODENOSCOPY (EGD) WITH PROPOFOL;  Surgeon: Harvel Quale, MD;  [facility];  Service: Gastroenterology;  Laterality: N/A;   LEFT HEART CATH AND CORONARY ANGIOGRAPHY N/A 03/08/2019   Procedure: LEFT HEART CATH AND CORONARY ANGIOGRAPHY;  Surgeon: Belva Crome, MD;  Location: [place] CV LAB;  Service: Cardiovascular;  Laterality: N/A;   POLYPECTOMY  03/01/2020   Procedure: POLYPECTOMY;  Surgeon: Harvel Quale, MD;  [facility];  Service: Gastroenterology;;   TEE WITHOUT CARDIOVERSION N/A 03/10/2019   Procedure: TRANSESOPHAGEAL ECHOCARDIOGRAM (TEE);  Surgeon: Prescott Gum, Collier Salina, MD;  Location: [place];  Service: Open Heart Surgery;  Laterality: N/A;   TONSILLECTOMY      Current Outpatient Medications  Medication Sig Dispense Refill   allopurinol (ZYLOPRIM)  300 MG tablet Take 300 mg by mouth daily.      atorvastatin (LIPITOR) 80 MG tablet Take 1 tablet (80 mg total) by mouth daily. 90 tablet 1   Cholecalciferol (VITAMIN D-3) 1000 UNITS CAPS Take 1,000 Units by mouth every morning.      colchicine 0.6 MG tablet Take 1 tablet (0.6 mg total) by mouth daily at 6 PM. (Patient taking differently: Take 0.6 mg by mouth as needed.) 30 tablet 1   Cyanocobalamin (VITAMIN B-12) 2500 MCG SUBL Place 2,500 mcg under the  tongue every morning.      diclofenac Sodium (VOLTAREN) 1 % GEL SMARTSIG:0.5 Sparingly Topical Twice Daily     docusate sodium (COLACE) 100 MG capsule Take 2 capsules (200 mg total) by mouth daily. (Patient taking differently: Take 100 mg by mouth daily as needed.) 60 capsule 0   ELIQUIS 5 MG TABS tablet TAKE 1 TABLET BY MOUTH TWICE A DAY (Patient taking differently: Take 5 mg by mouth 2 (two) times daily.) 180 tablet 2   fluticasone (FLONASE) 50 MCG/ACT nasal spray Place 2 sprays into the nose daily.     folic acid (FOLVITE) 1 MG tablet TAKE ONE TABLET BY MOUTH EVERY DAY (Patient taking differently: Take 1 mg by mouth daily.) 30 tablet 6   furosemide (LASIX) 40 MG tablet Take 1 tablet (40 mg total) by mouth daily. 90 tablet 3   HYDROcodone-acetaminophen (NORCO) 10-325 MG tablet Take 1 tablet by mouth every 6 (six) hours as needed.     KLOR-CON M15 15 MEQ tablet TAKE 2 TABLETS BY MOUTH DAILY 180 tablet 3   loratadine (CLARITIN) 10 MG tablet Take 10 mg by mouth daily.     LORazepam (ATIVAN) 0.5 MG tablet Take 0.5 mg by mouth at bedtime.      meclizine (ANTIVERT) 32 MG tablet Take 32 mg by mouth daily as needed for dizziness.      nitroGLYCERIN (NITROSTAT) 0.4 MG SL tablet Place 1 tablet (0.4 mg total) under the tongue every 5 (five) minutes as needed for chest pain. 25 tablet 3   pantoprazole (PROTONIX) 40 MG tablet Take 1 tablet (40 mg total) by mouth daily. 30 tablet 0   polyethylene glycol (MIRALAX / GLYCOLAX) packet Take 17 g by mouth at bedtime as needed for mild constipation.     sacubitril-valsartan (ENTRESTO) 24-26 MG Take 1 tablet by mouth 2 (two) times daily. 60 tablet 0   tamsulosin (FLOMAX) 0.4 MG CAPS capsule Take 1 capsule (0.4 mg total) by mouth 2 (two) times daily. 60 capsule 0   No current facility-administered medications for this visit.   Allergies:  Penicillins   ROS: No syncope.  Physical Exam: VS:  BP (!) 148/82   Pulse 84   Ht '5\' 7"'$  (1.702 m)   Wt 238 lb 3.2 oz (108  kg)   SpO2 91%   BMI 37.31 kg/m , BMI Body mass index is 37.31 kg/m.  Wt Readings from Last 3 Encounters:  11/12/21 238 lb 3.2 oz (108 kg)  08/04/21 242 lb 6.4 oz (110 kg)  07/17/21 245 lb 1.3 oz (111.2 kg)    General: Patient appears comfortable at rest. HEENT: Conjunctiva and lids normal. Neck: Supple, no elevated JVP or carotid bruits, no thyromegaly. Lungs: Clear to auscultation, nonlabored breathing at rest. Cardiac: Irregularly irregular, no S3 or significant systolic murmur, no pericardial rub. Extremities: No pitting edema.  ECG:  An ECG dated 01/31/2021 was personally reviewed today and demonstrated:  Atrial fibrillation with PVCs  and nonspecific ST-T changes, IVCD.  Recent Labwork:    Component Value Date/Time   CHOL 170 03/07/2019 2358   TRIG 218 (H) 03/07/2019 2358   HDL 41 03/07/2019 2358   CHOLHDL 4.1 03/07/2019 2358   VLDL 44 (H) 03/07/2019 2358   LDLCALC 85 03/07/2019 2358  February 2023: Hemoglobin A1c 5.7%, cholesterol 135, triglycerides 60, HDL 61, LDL 61, BUN 13, creatinine 0.73, potassium 4.3, AST 22, ALT 16, hemoglobin 13.5, platelets 179  Other Studies Reviewed Today:  Echocardiogram 04/24/2021:  1. Limited study with Definity contrast.   2. Left ventricular ejection fraction, by estimation, is 50 to 55%. The  left ventricle has low normal function. The left ventricle demonstrates  regional wall motion abnormalities (see scoring diagram/findings for  description).   3. Right ventricular systolic function is low normal. The right  ventricular size is normal.   Lexiscan Myoview 11/11/2021:   Findings are consistent with prior myocardial infarction. The study is low risk.   No ST deviation was noted.   LV perfusion is abnormal. There is no evidence of ischemia. There is evidence of infarction. Defect 1: There is a small defect with mild reduction in uptake present in the apical to basal inferior location(s) that is fixed. There is abnormal wall motion in  the defect area. Consistent with infarction.   Left ventricular function is abnormal. Global function is moderately reduced. End diastolic cavity size is normal. End systolic cavity size is normal.   Small inferior wall infarct from apex to base no ischemia EF estimated at 42% but patient in afib    Assessment and Plan:  1.  HFmrEF, LVEF approximately 50% by echocardiogram and 42% by recent Myoview with evidence of inferior scar but no active ischemia.  Plan will be to modify GDMT in stepwise fashion.  Stop lisinopril and transition to Entresto 24/26 mg twice daily.  Continue current dose of Lasix with potassium supplement.  He was taken off metoprolol last year due to symptomatic bradycardia so we will hold off on initiation of beta-blocker at this time.  Check BMET in 2 weeks and look to add SGLT2 inhibitor plus minus MRA next.  Clinical follow-up in the next 4 to 6 weeks.  2.  CAD status post CABG in October 2020.  No definite angina at this time and no large ischemic territories by recent Myoview.  Continue Lipitor.  3.  Permanent atrial fibrillation with CHA2DS2-VASc score of 4, not on AV nodal blockers given symptomatic bradycardia previously.  He remains on Eliquis for stroke prophylaxis.  Medication Adjustments/Labs and Tests Ordered: Current medicines are reviewed at length with the patient today.  Concerns regarding medicines are outlined above.   Tests Ordered: Orders Placed This Encounter  Procedures   Basic metabolic panel    Medication Changes: Meds ordered this encounter  Medications   sacubitril-valsartan (ENTRESTO) 24-26 MG    Sig: Take 1 tablet by mouth 2 (two) times daily.    Dispense:  60 tablet    Refill:  0    Disposition:  Follow up  4 to 6 weeks.  Signed, Satira Sark, MD, [facility] 11/12/2021 2:05 PM    [place] at [place], [place], [place] [address] Phone: [phone]; Fax: [phone]

## 2021-11-12 NOTE — Patient Instructions (Signed)
Medication Instructions:  Your physician has recommended you make the following change in your medication: Stop lisinopril Start entresto 26/26 one tablet twice a day on Friday Continue all other medications as directed  Labwork: BMET in 2 weeks at [facility]  Testing/Procedures: none  Follow-Up: Your physician recommends that you schedule a follow-up appointment in: 4-6 weeks  Any Other Special Instructions Will Be Listed Below (If Applicable).  If you need a refill on your cardiac medications before your next appointment, please call your pharmacy.

## 2021-11-12 NOTE — Addendum Note (Signed)
Addended by: Sung Amabile on: 11/12/2021 02:41 PM   Modules accepted: Orders

## 2021-11-14 DIAGNOSIS — I25119 Atherosclerotic heart disease of native coronary artery with unspecified angina pectoris: Secondary | ICD-10-CM | POA: Diagnosis not present

## 2021-12-02 ENCOUNTER — Telehealth: Payer: Self-pay | Admitting: *Deleted

## 2021-12-02 ENCOUNTER — Other Ambulatory Visit: Payer: Self-pay | Admitting: Internal Medicine

## 2021-12-02 ENCOUNTER — Telehealth: Payer: Self-pay

## 2021-12-02 DIAGNOSIS — G4733 Obstructive sleep apnea (adult) (pediatric): Secondary | ICD-10-CM

## 2021-12-02 MED ORDER — EMPAGLIFLOZIN 10 MG PO TABS: 10.0000 mg | ORAL_TABLET | Freq: Every day | ORAL | 6 refills | Status: DC

## 2021-12-02 MED ORDER — EMPAGLIFLOZIN 10 MG PO TABS: 10.0000 mg | ORAL_TABLET | Freq: Every day | ORAL | 0 refills | Status: DC

## 2021-12-04 DIAGNOSIS — R0600 Dyspnea, unspecified: Secondary | ICD-10-CM | POA: Diagnosis not present

## 2021-12-04 DIAGNOSIS — M545 Low back pain, unspecified: Secondary | ICD-10-CM | POA: Diagnosis not present

## 2021-12-04 DIAGNOSIS — Z634 Disappearance and death of family member: Secondary | ICD-10-CM | POA: Diagnosis not present

## 2021-12-08 ENCOUNTER — Telehealth: Payer: Self-pay | Admitting: Pulmonary Disease

## 2021-12-08 NOTE — Telephone Encounter (Signed)
Called and patient states he has not heard from anyone about mask fitting. Gave him sleep disorder center number [phone] and advised him to ask if mask fitting could be done at [facility] and to let me know if he had a hard time getting in touch with anyone or needed anything. Patient states he has not been able to sleep much at all over the last few weeks and he is very fatigued and concerned. He states he will call back if he needs further assistance with mask fitting but would also like a message sent to Dr. [name] as an Juluis Rainier. Nothing further needed at this time.

## 2021-12-10 ENCOUNTER — Encounter: Payer: Self-pay | Admitting: Physician Assistant

## 2021-12-10 NOTE — Progress Notes (Unsigned)
Cardiology Office Note    Date:  12/11/2021   ID:  Martin [name], Martin [name] Sep 26, 1955, MRN [id number]  PCP:  [name], MD  Cardiologist:  Rozann Lesches, MD  Electrophysiologist:  [name], MD   Chief Complaint: f/u CHF, DOE  History of Present Illness:   Martin [name] is a 66 y.o. male with history of CAD (PTCA/stenting of LAD 2001, DES to RCA/mLAD 2011, CABG 03/2019), mild carotid disease by duplex 2020 (1-39% BICA), OSA on CPAP followed by pulm, permanent atrial fibrillation (dx 10/2017), chronic HFpEF (EF 50-55%), HTN, HLD, habitual alcohol use, prior street use of THC/Vicodin, asymptomatic PVCs by prior [place] who is seen for follow-up.   He has prior history of CAD and PCI then eventual CABG in 03/2019 with LIMA-LAD, SVG-ramus, SVG-PDA. EF at that time was 55-60%. He also has history of atrial fib diagnosed in 2019 s/p DCCV but recurrence of AFib. He was seen by Dr. Lovena Le on amiodarone for a period of time but this was stopped due to bradycardia and he's been managed as permanent atrial fibrillation with a rate control strategy per chart. Metoprolol was also previously stopped due to beta blocker. He has been following with Dr. Domenic Polite over the last several months for dyspnea on exertion. This was also in the context of grieving his wife's death. She passed from ALS. He also has had some degree of mild LV systolic dysfunction with EF 45-55% range in 03/2021, repeat limited study with Definity 04/2021 EF 50-55% + WMA as outlined below, low normal RV function. Dr. Domenic Polite arranged nuclear stress test 11/11/21 showing small inferior wall infarct but no ischemia, EF estimated at 42% but patient in Afib - Dr. Domenic Polite recommended continued clinical follow-up. At last OV 11/12/21, lisinopril was switched to [facility]. He also had addition of Jardiance about 2 weeks ago per his report. (Initially rx'd as samples then transitioned to rx - removed duplicate samples from AVS.)  He  continues to feel class 2-3 dyspnea as reported in prior visits. No chest pain. No orthopnea, edema reported. Weight continues to downtrend slightly. He no longer smokes - quit in 2020. Drinks ETOH on occasion, may drink 3-4 beers on day then none for several days. We discussed importance of cutting down especially since on blood thinner therapy. He reports adherence to his cardiac med regimen. He only takes colchicine rarely due to the drug interaction with his cholesterol medication. He really enjoys going to cardiac rehab and tolerates this well. He does report his BP has been well controlled there, citing values yesterday of 106/60 and 108/60.    Labwork independently reviewed: 11/14/21 K 3.7, Cr 0.76 Scanned labs 07/2021 07/2021 A1C 5.7, LDL 61, trig 60, HDL 61, LFTs ok except alk phos mildly up 126 (followed by PCP), CBC wnl  Cardiology Studies:   Studies reviewed are outlined and summarized above. Reports included below if pertinent.   Nuc 11/11/21   Findings are consistent with prior myocardial infarction. The study is low risk.   No ST deviation was noted.   LV perfusion is abnormal. There is no evidence of ischemia. There is evidence of infarction. Defect 1: There is a small defect with mild reduction in uptake present in the apical to basal inferior location(s) that is fixed. There is abnormal wall motion in the defect area. Consistent with infarction.   Left ventricular function is abnormal. Global function is moderately reduced. End diastolic cavity size is normal. End systolic cavity size is normal.  Small inferior wall infarct from apex to base no ischemia EF estimated at 42% but patient in afib  Per MD result note: "Results reviewed.  Follow-up stress test shows evidence of prior inferior infarct scar but no active ischemic territories.  LVEF calculated at 42% in atrial fibrillation.  Please make sure that follow-up has been scheduled as per recent telephone note.  Hopefully can adjust  medications depending on his overall symptoms."  2D echo limited 04/2021    1. Limited study with Definity contrast.   2. Left ventricular ejection fraction, by estimation, is 50 to 55%. The  left ventricle has low normal function. The left ventricle demonstrates  regional wall motion abnormalities (see scoring diagram/findings for  description).   3. Right ventricular systolic function is low normal. The right  ventricular size is normal.   PreCABG dopplers 2020  Summary:  Right Carotid: Velocities in the right ICA are consistent with a 1-39%  stenosis.                 No change since study done 05/23/16.   Left Carotid: Velocities in the left ICA are consistent with a 1-39%  stenosis.                No change since study done 05/23/16.  Vertebrals:  Bilateral vertebral arteries demonstrate antegrade flow.  Subclavians: Normal flow hemodynamics were seen in bilateral subclavian               arteries.   Right ABI: Resting right ankle-brachial index indicates mild right lower  extremity arterial disease. The right toe-brachial index is abnormal.  Left ABI: Resting left ankle-brachial index is within normal range. No  evidence of significant left lower extremity arterial disease. The left  toe-brachial index is normal.  Right Upper Extremity: Doppler waveforms remain within normal limits with  right radial compression. Doppler waveforms remain within normal limits  with right ulnar compression.  Left Upper Extremity: Doppler waveforms remain within normal limits with  left radial compression. Doppler waveforms remain within normal limits  with left ulnar compression.     Cath 2020 Severe three-vessel coronary calcification Patent left main Eccentric ostial 50 to 75% LAD with mid vessel 40 to 50% narrowing.  Mid to distal LAD stent is widely patent. Ramus intermedius contains proximal 90% stenosis. Circumflex contains 99% proximal/ostial stenosis. RCA is heavily calcified  and stented.  The artery is totally occluded in the proximal to mid segment.  Left-to-right collaterals are noted. Mildly reduced LV systolic function with EF estimated to be 40%.  LVEDP is 28 mmHg.  Findings compatible with acute on chronic combined systolic and diastolic heart failure.   RECOMMENDATIONS:   IV nitroglycerin is added to help control the patient's blood pressure and lower LVEDP. Low-dose beta-blocker therapy in the form of metoprolol 25 mg twice daily is added.  I have held diltiazem given LV function. IV heparin will be resumed. Surgical consultation is requested.      Past Medical History:  Diagnosis Date   (HFpEF) heart failure with preserved ejection fraction (HCC)    CAD (coronary artery disease)    a. s/p PCI to LAD 2001. b. s/p stenting to the RCA and mid LAD December 2011, residual distal RCA disease treated medically. c.  Multivessel disease status post CABG October 2020   Carotid artery disease ([place])    Essential hypertension    GERD (gastroesophageal reflux disease)    Habitual alcohol use    Hyperlipidemia  Low back pain    Myocardial infarction ([place]) 2020   OSA on CPAP    Osteoarthritis    Permanent atrial fibrillation ([place])    a. dx 10/2017   Polysubstance abuse ([place])    History of marijuana and Vicodin abuse   PVC's (premature ventricular contractions)    Seasonal allergies    Stroke [facility]) 2020    Past Surgical History:  Procedure Laterality Date   CARDIOVERSION N/A 11/19/2017   Procedure: CARDIOVERSION;  Surgeon: Herminio Commons, MD;  [facility];  Service: Cardiovascular;  Laterality: N/A;   COLONOSCOPY WITH PROPOFOL N/A 03/01/2020   Procedure: COLONOSCOPY WITH PROPOFOL;  Surgeon: Harvel Quale, MD;  [facility];  Service: Gastroenterology;  Laterality: N/A;  730   CORONARY ANGIOPLASTY WITH STENT PLACEMENT  2011   CORONARY ARTERY BYPASS GRAFT N/A 03/10/2019   Procedure: CORONARY ARTERY BYPASS GRAFTING  (CABG)X 3 Using Left Internal Mammary Artery and Right Saphenous Vein Grafts;  Surgeon: Ivin Poot, MD;  Location: [place];  Service: Open Heart Surgery;  Laterality: N/A;   ESOPHAGOGASTRODUODENOSCOPY (EGD) WITH PROPOFOL N/A 03/01/2020   Procedure: ESOPHAGOGASTRODUODENOSCOPY (EGD) WITH PROPOFOL;  Surgeon: Harvel Quale, MD;  [facility];  Service: Gastroenterology;  Laterality: N/A;   LEFT HEART CATH AND CORONARY ANGIOGRAPHY N/A 03/08/2019   Procedure: LEFT HEART CATH AND CORONARY ANGIOGRAPHY;  Surgeon: Belva Crome, MD;  Location: [place] [facility];  Service: Cardiovascular;  Laterality: N/A;   POLYPECTOMY  03/01/2020   Procedure: POLYPECTOMY;  Surgeon: Harvel Quale, MD;  [facility];  Service: Gastroenterology;;   TEE WITHOUT CARDIOVERSION N/A 03/10/2019   Procedure: TRANSESOPHAGEAL ECHOCARDIOGRAM (TEE);  Surgeon: Prescott Gum, Collier Salina, MD;  Location: [place];  Service: Open Heart Surgery;  Laterality: N/A;   TONSILLECTOMY      Current Medications: Current Meds  Medication Sig   allopurinol (ZYLOPRIM) 300 MG tablet Take 300 mg by mouth daily.    atorvastatin (LIPITOR) 80 MG tablet Take 1 tablet (80 mg total) by mouth daily.   Cholecalciferol (VITAMIN D-3) 1000 UNITS CAPS Take 1,000 Units by mouth every morning.    colchicine 0.6 MG tablet Take 1 tablet (0.6 mg total) by mouth daily at 6 PM. (Patient taking differently: Take 0.6 mg by mouth as needed.)   Cyanocobalamin (VITAMIN B-12) 2500 MCG SUBL Place 2,500 mcg under the tongue every morning.    diclofenac Sodium (VOLTAREN) 1 % GEL Takes only as needed   docusate sodium (COLACE) 100 MG capsule Take 2 capsules (200 mg total) by mouth daily. (Patient taking differently: Take 100 mg by mouth daily as needed.)   ELIQUIS 5 MG TABS tablet TAKE 1 TABLET BY MOUTH TWICE A DAY (Patient taking differently: Take 5 mg by mouth 2 (two) times daily.)   [START ON 12/16/2021] empagliflozin (JARDIANCE) 10 MG TABS  tablet Take 1 tablet (10 mg total) by mouth daily before breakfast.   fluticasone (FLONASE) 50 MCG/ACT nasal spray Place 2 sprays into the nose daily.   folic acid (FOLVITE) 1 MG tablet TAKE ONE TABLET BY MOUTH EVERY DAY (Patient taking differently: Take 1 mg by mouth daily.)   furosemide (LASIX) 40 MG tablet TAKE 1 TABLET BY MOUTH EVERY DAY   HYDROcodone-acetaminophen (NORCO) 10-325 MG tablet Take 1 tablet by mouth every 6 (six) hours as needed.   KLOR-CON M15 15 MEQ tablet TAKE 2 TABLETS BY MOUTH DAILY   loratadine (CLARITIN) 10 MG tablet Take 10 mg by  mouth daily.   LORazepam (ATIVAN) 0.5 MG tablet Take 0.5 mg by mouth at bedtime.    meclizine (ANTIVERT) 32 MG tablet Take 32 mg by mouth daily as needed for dizziness.    nitroGLYCERIN (NITROSTAT) 0.4 MG SL tablet Place 1 tablet (0.4 mg total) under the tongue every 5 (five) minutes as needed for chest pain.   pantoprazole (PROTONIX) 40 MG tablet Take 1 tablet (40 mg total) by mouth daily.   polyethylene glycol (MIRALAX / GLYCOLAX) packet Take 17 g by mouth at bedtime as needed for mild constipation.   sacubitril-valsartan (ENTRESTO) 24-26 MG Take 1 tablet by mouth 2 (two) times daily.   tamsulosin (FLOMAX) 0.4 MG CAPS capsule Take 0.4 mg by mouth at bedtime.   [DISCONTINUED] empagliflozin (JARDIANCE) 10 MG TABS tablet Take 1 tablet (10 mg total) by mouth daily before breakfast for 14 days.   [DISCONTINUED] sacubitril-valsartan (ENTRESTO) 24-26 MG Take 1 tablet by mouth 2 (two) times daily.   [DISCONTINUED] tamsulosin (FLOMAX) 0.4 MG CAPS capsule Take 1 capsule (0.4 mg total) by mouth 2 (two) times daily. (Patient taking differently: Take 0.4 mg by mouth every evening.)      Allergies:   Penicillins   Social History   Socioeconomic History   Marital status: Widowed    Spouse name: Not on file   Number of children: 1   Years of education: Not on file   Highest education level: 9th grade  Occupational History    Employer: UNEMPLOYED     Comment: works Social worker employed  Tobacco Use   Smoking status: Former    Packs/day: 1.00    Years: 35.00    Total pack years: 35.00    Types: Cigarettes    Start date: 09/18/1967    Quit date: 06/09/1999    Years since quitting: 22.5   Smokeless tobacco: Never  Vaping Use   Vaping Use: Never used  Substance and Sexual Activity   Alcohol use: Not Currently    Alcohol/week: 0.0 standard drinks of alcohol    Comment: .stopped October on 2020   Drug use: Yes    Types: Marijuana    Comment: last smoked 2 months ago   Sexual activity: Not on file  Other Topics Concern   Not on file  Social History Narrative   No exercise   Pt lives with spouse in 1 story home   Right handed   Drinks coffee every morning, not tea, no soda   One son high level of education is 9th grade   Social Determinants of Health   Financial Resource Strain: Not on file  Food Insecurity: Not on file  Transportation Needs: Not on file  Physical Activity: Not on file  Stress: Not on file  Social Connections: Not on file     Family History:  The patient's family history includes Healthy in his son; Heart attack in his brother and father; Other in his mother; Stroke in his mother.  ROS:   Please see the history of present illness.  All other systems are reviewed and otherwise negative.    EKG(s)/Additional Labs   EKG:  EKG is ordered today, personally reviewed, demonstrating atrial fib 63bpm, rightward axis, nonspecific STTW changes no significant change from prior   Recent Labs: No results found for requested labs within last 365 days.  Recent Lipid Panel    Component Value Date/Time   CHOL 170 03/07/2019 2358   TRIG 218 (H) 03/07/2019 2358   HDL 41 03/07/2019 2358  CHOLHDL 4.1 03/07/2019 2358   VLDL 44 (H) 03/07/2019 2358   LDLCALC 85 03/07/2019 2358    PHYSICAL EXAM:    VS:  BP (!) 160/72   Pulse 63   Ht '5\' 7"'  (1.702 m)   Wt 236 lb 3.2 oz (107.1 kg)   SpO2 96%   BMI 36.99  kg/m   BMI: Body mass index is 36.99 kg/m.  GEN: Well nourished, well developed male in no acute distress HEENT: normocephalic, atraumatic Neck: no JVD, carotid bruits, or masses Cardiac: irregularly irregular, rate controlled; no murmurs, rubs, or gallops, no edema  Respiratory: diminished BS throughout, no wheezing rales or rhonchi, normal work of breathing GI: soft, nontender, nondistended, + BS MS: no deformity or atrophy Skin: warm and dry, no rash Neuro:  Alert and Oriented x 3, Strength and sensation are intact, follows commands Psych: euthymic mood, full affect  Wt Readings from Last 3 Encounters:  12/11/21 236 lb 3.2 oz (107.1 kg)  11/12/21 238 lb 3.2 oz (108 kg)  08/04/21 242 lb 6.4 oz (110 kg)     ASSESSMENT & PLAN:   1. Dyspnea on exertion, chronic HFpEF - remains class II-III as previously described in prior clinic notes. Last limited echo 04/2021 (around start on dyspnea per patient report showed EF 50-55%), nuc 11/2021 without ischemia, EF 42%. Will update CBC, BMET, BNP today and also updated 2V CXR given some abnormalities described on study in 09/2021. I will plan to review his case with Dr. Domenic Polite after completion given that he has not had a significant change in his dyspnea despite med titration. Although his BP is elevated in clinic today (improving on recheck as above), he reports cardiac rehab BPs 341-443 systolic pre- and post-exercise.  2. Permanent atrial fibrillation - rate controlled on no AVN blocking agents. Continue Eliquis. Update labs above.  3. CAD s/p CABG 03/2019 - not on ASA due to Eliquis and not on BB due to baseline sinus bradycardia. Continue statin.  4. Mixed HLD - LDL 61 by scanned labs. Lipids followed in primary care.  5. Mild carotid artery disease - last duplex in 2020. No significant carotid bruit on exam. Follow clinically and consider repeat study in the next year.    Disposition: F/u with myself or APP in 4-6 weeks. Will also review  with Dr. Domenic Polite.   Medication Adjustments/Labs and Tests Ordered: Current medicines are reviewed at length with the patient today.  Concerns regarding medicines are outlined above. Medication changes, Labs and Tests ordered today are summarized above and listed in the Patient Instructions accessible in Encounters.    Signed, Charlie Pitter, PA-C  12/11/2021 3:25 PM    [facility] Location in [place], [place] [address] Ph: [phone]; Fax [phone]

## 2021-12-11 ENCOUNTER — Ambulatory Visit (INDEPENDENT_AMBULATORY_CARE_PROVIDER_SITE_OTHER): Payer: Medicare Other | Admitting: Physician Assistant

## 2021-12-11 ENCOUNTER — Encounter: Payer: Self-pay | Admitting: Physician Assistant

## 2021-12-11 ENCOUNTER — Other Ambulatory Visit (HOSPITAL_COMMUNITY)
Admission: RE | Admit: 2021-12-11 | Discharge: 2021-12-11 | Disposition: A | Discharge status: Home / Self Care | Payer: Medicare Other | Attending: Physician Assistant | Admitting: Physician Assistant

## 2021-12-11 VITALS — BP 145/72 | HR 63 | Ht 67.0 in | Wt 236.2 lb

## 2021-12-11 DIAGNOSIS — R0609 Other forms of dyspnea: Secondary | ICD-10-CM | POA: Diagnosis not present

## 2021-12-11 DIAGNOSIS — I5032 Chronic diastolic (congestive) heart failure: Secondary | ICD-10-CM

## 2021-12-11 DIAGNOSIS — I4821 Permanent atrial fibrillation: Secondary | ICD-10-CM

## 2021-12-11 DIAGNOSIS — I2510 Atherosclerotic heart disease of native coronary artery without angina pectoris: Secondary | ICD-10-CM | POA: Diagnosis not present

## 2021-12-11 DIAGNOSIS — E782 Mixed hyperlipidemia: Secondary | ICD-10-CM

## 2021-12-11 DIAGNOSIS — I779 Disorder of arteries and arterioles, unspecified: Secondary | ICD-10-CM

## 2021-12-11 LAB — BASIC METABOLIC PANEL
Anion gap: 6 (ref 5–15)
BUN: 19 mg/dL (ref 8–23)
CO2: 26 mmol/L (ref 22–32)
Calcium: 9 mg/dL (ref 8.9–10.3)
Chloride: 109 mmol/L (ref 98–111)
Creatinine, Ser: 0.76 mg/dL (ref 0.61–1.24)
GFR, Estimated: >60 mL/min (ref >60)
Glucose, Bld: 107 mg/dL — ABNORMAL HIGH (ref 70–99)
Potassium: 4 mmol/L (ref 3.5–5.1)
Sodium: 141 mmol/L (ref 135–145)

## 2021-12-11 LAB — CBC
HCT: 40.5 % (ref 39.0–52.0)
Hemoglobin: 13 g/dL (ref 13.0–17.0)
MCH: 29.8 pg (ref 26.0–34.0)
MCHC: 32.1 g/dL (ref 30.0–36.0)
MCV: 92.9 fL (ref 80.0–100.0)
Platelets: 189 10*3/uL (ref 150–400)
RBC: 4.36 MIL/uL (ref 4.22–5.81)
RDW: 14.7 % (ref 11.5–15.5)
WBC: 6.9 10*3/uL (ref 4.0–10.5)
nRBC: 0 % (ref 0.0–0.2)

## 2021-12-11 LAB — BRAIN NATRIURETIC PEPTIDE: B Natriuretic Peptide: 255 pg/mL — ABNORMAL HIGH (ref 0.0–100.0)

## 2021-12-11 NOTE — Patient Instructions (Addendum)
Medication Instructions:  Your physician recommends that you continue on your current medications as directed. Please refer to the Current Medication list given to you today.   Labwork: BMET BNP CBC  Testing/Procedures: chest X-ray   Follow-Up: Follow up with Melina Copa, PA-C or Bernerd Pho, PA-C in 4-6 weeks.   Any Other Special Instructions Will Be Listed Below (If Applicable).     If you need a refill on your cardiac medications before your next appointment, please call your pharmacy.

## 2021-12-12 ENCOUNTER — Telehealth: Payer: Self-pay

## 2021-12-12 ENCOUNTER — Ambulatory Visit (HOSPITAL_COMMUNITY)
Admission: RE | Admit: 2021-12-12 | Discharge: 2021-12-12 | Disposition: A | Discharge status: Home / Self Care | Payer: Medicare Other | Source: Ambulatory Visit | Attending: Physician Assistant | Admitting: Physician Assistant

## 2021-12-12 DIAGNOSIS — Z79899 Other long term (current) drug therapy: Secondary | ICD-10-CM

## 2021-12-12 DIAGNOSIS — R0609 Other forms of dyspnea: Secondary | ICD-10-CM | POA: Diagnosis not present

## 2021-12-12 DIAGNOSIS — R918 Other nonspecific abnormal finding of lung field: Secondary | ICD-10-CM | POA: Diagnosis not present

## 2021-12-12 DIAGNOSIS — J90 Pleural effusion, not elsewhere classified: Secondary | ICD-10-CM | POA: Diagnosis not present

## 2021-12-12 MED ORDER — SPIRONOLACTONE 25 MG PO TABS: 12.5000 mg | ORAL_TABLET | Freq: Every day | ORAL | 3 refills | Status: DC

## 2021-12-12 NOTE — Telephone Encounter (Signed)
Lab results discussed with patient. He will begin spironolactone 12.5 mg qd and repeat bmet in 1 week.   Echo scheduled today at 1130

## 2021-12-12 NOTE — Telephone Encounter (Signed)
Left message to return call 

## 2021-12-12 NOTE — Telephone Encounter (Signed)
-----   Message from Charlie Pitter, [place] sent at 12/12/2021  9:20 AM EDT ----- Please let pt know labs overall look relatively stable compared to prior, will also await CXR result. Per discussion with Dr. Domenic Polite, please add spironolactone 12.'5mg'$  daily and repeat echocardiogram to reassess his LV function (diagnosis dyspnea on exertion). If his LVEF is reduced further or symptoms do not improve, we will eventually look towards repeating a left and right heart cath. Please repeat BMET 1 week after starting spironolactone and ask him to watch his BP at home. Thanks

## 2021-12-15 ENCOUNTER — Telehealth: Payer: Self-pay

## 2021-12-15 ENCOUNTER — Telehealth: Payer: Self-pay | Admitting: Physician Assistant

## 2021-12-15 DIAGNOSIS — R911 Solitary pulmonary nodule: Secondary | ICD-10-CM

## 2021-12-15 NOTE — Telephone Encounter (Signed)
Patient notified, copied pcp,order placed

## 2021-12-15 NOTE — Telephone Encounter (Signed)
Already addressed by [facility] staff.  See epic notes.

## 2021-12-15 NOTE — Telephone Encounter (Signed)
-----   Message from Charlie Pitter, [place] sent at 12/15/2021  7:24 AM EDT ----- Please let pt know that CXR shows patchiness in the right lower lungs and a new nodular density. Not clear what this represents, as a  CT would give [place] more information. Please order CT chest without contrast for further evaluation. (Not CT angio.) Thank you.

## 2021-12-15 NOTE — Telephone Encounter (Signed)
[facility]iology calling with a call report from the Chest X-ray from 12/12/21

## 2021-12-16 ENCOUNTER — Telehealth: Payer: Self-pay

## 2021-12-16 ENCOUNTER — Other Ambulatory Visit: Payer: Medicare Other

## 2021-12-16 DIAGNOSIS — R0602 Shortness of breath: Secondary | ICD-10-CM

## 2021-12-16 NOTE — Telephone Encounter (Signed)
I spoke with patient and he agrees to increase lasix to 60 mg for the next 3 days.BNP added to lab work

## 2021-12-16 NOTE — Telephone Encounter (Signed)
-----   Message from Charlie Pitter, [place] sent at 12/16/2021  6:10 AM EDT ----- Appreciate Dr. Bari Mantis comments, raised question of needing more diuresis. We started spironolactone after recent OV.  [place] triage - Let's have him to do burst dose Lasix for a few days and keep the BMET as planned - Lasix '40mg'$  daily QAM/'20mg'$  QPM for 3 days then return to Lasix '40mg'$  daily - otherwise continue present regimen and await the CT. Please add f/u BNP to the repeat BMET. Thanks.

## 2021-12-16 NOTE — Telephone Encounter (Signed)
Called and notified patient of response. Nothing further needed.

## 2021-12-22 ENCOUNTER — Other Ambulatory Visit (HOSPITAL_COMMUNITY): Payer: Medicare Other

## 2021-12-23 ENCOUNTER — Ambulatory Visit (HOSPITAL_COMMUNITY)
Admission: RE | Admit: 2021-12-23 | Discharge: 2021-12-23 | Disposition: A | Discharge status: Home / Self Care | Payer: Medicare Other | Source: Ambulatory Visit | Attending: Cardiology | Admitting: Cardiology

## 2021-12-23 DIAGNOSIS — R0609 Other forms of dyspnea: Secondary | ICD-10-CM

## 2021-12-23 LAB — ECHOCARDIOGRAM COMPLETE
AR max vel: 1.41 cm2
AV Area VTI: 1.39 cm2
AV Area mean vel: 1.38 cm2
AV Mean grad: 12.5 mmHg
AV Peak grad: 23.3 mmHg
Ao pk vel: 2.41 m/s
Area-P 1/2: 3.53 cm2
S' Lateral: 4.2 cm

## 2021-12-23 MED ORDER — PERFLUTREN LIPID MICROSPHERE
1.0000 mL | INTRAVENOUS | Status: AC | PRN
  Administered 2021-12-23: 3 mL via INTRAVENOUS

## 2021-12-23 NOTE — Progress Notes (Signed)
*  PRELIMINARY RESULTS* Echocardiogram 2D Echocardiogram has been performed with Definity.  Samuel Germany 12/23/2021, 12:47 PM

## 2022-01-05 DIAGNOSIS — G4733 Obstructive sleep apnea (adult) (pediatric): Secondary | ICD-10-CM | POA: Diagnosis not present

## 2022-01-06 ENCOUNTER — Ambulatory Visit (HOSPITAL_BASED_OUTPATIENT_CLINIC_OR_DEPARTMENT_OTHER): Payer: Medicare Other | Attending: Pulmonary Disease | Admitting: Pulmonary Disease

## 2022-01-06 DIAGNOSIS — G4733 Obstructive sleep apnea (adult) (pediatric): Secondary | ICD-10-CM

## 2022-01-09 DIAGNOSIS — M10061 Idiopathic gout, right knee: Secondary | ICD-10-CM | POA: Diagnosis not present

## 2022-01-09 DIAGNOSIS — M79674 Pain in right toe(s): Secondary | ICD-10-CM | POA: Diagnosis not present

## 2022-01-12 ENCOUNTER — Ambulatory Visit (HOSPITAL_COMMUNITY)
Admission: RE | Admit: 2022-01-12 | Discharge: 2022-01-12 | Disposition: A | Discharge status: Home / Self Care | Payer: Medicare Other | Source: Ambulatory Visit | Attending: Physician Assistant | Admitting: Physician Assistant

## 2022-01-12 DIAGNOSIS — H47011 Ischemic optic neuropathy, right eye: Secondary | ICD-10-CM | POA: Diagnosis not present

## 2022-01-12 DIAGNOSIS — R911 Solitary pulmonary nodule: Secondary | ICD-10-CM | POA: Insufficient documentation

## 2022-01-13 ENCOUNTER — Telehealth: Payer: Self-pay | Admitting: Pulmonary Disease

## 2022-01-13 DIAGNOSIS — J129 Viral pneumonia, unspecified: Secondary | ICD-10-CM

## 2022-01-13 DIAGNOSIS — R0602 Shortness of breath: Secondary | ICD-10-CM

## 2022-01-13 DIAGNOSIS — M79675 Pain in left toe(s): Secondary | ICD-10-CM | POA: Diagnosis not present

## 2022-01-13 DIAGNOSIS — I10 Essential (primary) hypertension: Secondary | ICD-10-CM

## 2022-01-13 DIAGNOSIS — S91212A Laceration without foreign body of left great toe with damage to nail, initial encounter: Secondary | ICD-10-CM | POA: Diagnosis not present

## 2022-01-13 DIAGNOSIS — M79672 Pain in left foot: Secondary | ICD-10-CM | POA: Diagnosis not present

## 2022-01-13 NOTE — Telephone Encounter (Signed)
I reviewed CT chest.  Right lower lobe infiltrate shows air bronchograms, very atypical appearance. He has fluid in esophagus so this could possibly be aspiration pneumonia especially if he is not very symptomatic Suggest obtain CBC for leukocytosis, treat regardless with Augmentin for 7 days. We can see him in follow-up in [place] in 2 to 3 weeks and repeat chest x-ray  Meagan please arrange for follow-up appointment when I am there next , obtain CXR prior

## 2022-01-13 NOTE — Telephone Encounter (Signed)
-----   Message from Charlie Pitter, [place] sent at 01/13/2022  1:45 PM EDT ----- Alison Stalling to add, to update Dr. [name] (see prior msg): spoke with patient, he actually states his breathing feels better than prior visits since adjustment in meds, no orthopnea. No clinical symptoms of pneumonia either - no fevers, chills, cough so I do not think the abnormality is clinically PNA. Appreciate your input! (Note to self: he is also aware of need to return for f/u BMET,BNP as previously suggested.)

## 2022-01-14 ENCOUNTER — Other Ambulatory Visit (HOSPITAL_COMMUNITY)
Admission: RE | Admit: 2022-01-14 | Discharge: 2022-01-14 | Disposition: A | Discharge status: Home / Self Care | Payer: Medicare Other | Source: Ambulatory Visit | Attending: Physician Assistant | Admitting: Physician Assistant

## 2022-01-14 DIAGNOSIS — I10 Essential (primary) hypertension: Secondary | ICD-10-CM | POA: Diagnosis not present

## 2022-01-14 DIAGNOSIS — R0602 Shortness of breath: Secondary | ICD-10-CM

## 2022-01-14 DIAGNOSIS — Z79899 Other long term (current) drug therapy: Secondary | ICD-10-CM | POA: Diagnosis not present

## 2022-01-14 LAB — BASIC METABOLIC PANEL
Anion gap: 6 (ref 5–15)
BUN: 19 mg/dL (ref 8–23)
CO2: 25 mmol/L (ref 22–32)
Calcium: 8.9 mg/dL (ref 8.9–10.3)
Chloride: 105 mmol/L (ref 98–111)
Creatinine, Ser: 0.71 mg/dL (ref 0.61–1.24)
GFR, Estimated: >60 mL/min (ref >60)
Glucose, Bld: 95 mg/dL (ref 70–99)
Potassium: 4.3 mmol/L (ref 3.5–5.1)
Sodium: 136 mmol/L (ref 135–145)

## 2022-01-14 LAB — CBC
HCT: 41.5 % (ref 39.0–52.0)
Hemoglobin: 13.5 g/dL (ref 13.0–17.0)
MCH: 30.5 pg (ref 26.0–34.0)
MCHC: 32.5 g/dL (ref 30.0–36.0)
MCV: 93.7 fL (ref 80.0–100.0)
Platelets: 184 10*3/uL (ref 150–400)
RBC: 4.43 MIL/uL (ref 4.22–5.81)
RDW: 15.2 % (ref 11.5–15.5)
WBC: 8.4 10*3/uL (ref 4.0–10.5)
nRBC: 0 % (ref 0.0–0.2)

## 2022-01-14 LAB — BRAIN NATRIURETIC PEPTIDE: B Natriuretic Peptide: 255 pg/mL — ABNORMAL HIGH (ref 0.0–100.0)

## 2022-01-14 MED ORDER — LEVOFLOXACIN 500 MG PO TABS: ORAL_TABLET | ORAL | 0 refills | Status: DC

## 2022-01-14 NOTE — Telephone Encounter (Signed)
ATC  patient. LMTCB so we can get him scheduled and a CXR ordered.

## 2022-01-14 NOTE — Addendum Note (Signed)
Addended by: Fritzi Mandes D on: 01/14/2022 12:35 PM   Modules accepted: Orders

## 2022-01-14 NOTE — Telephone Encounter (Signed)
Melina Copa discussed PCN allergy with Dr [name] who suggested alternative or either Omnicef '300mg'$  BID or Levaquin '500mg'$  daily x 7 days. Patient confirms rash and swelling in face therefore per [place] will hold off Omnicef and use Levaquin. Patient will be instructed to avoid heavy physical exertion while on this antibiotic

## 2022-01-14 NOTE — Telephone Encounter (Signed)
Called and spoke to patient. He voiced understanding about Dr. [name] wanting an OV and CXR. Patient is scheduled for 8/21 and is planning to get CXR on 8/18 next Friday. Nothing further needed at this time.

## 2022-01-14 NOTE — Telephone Encounter (Signed)
Levaquin '500mg'$  tablets x7 days per Melina Copa, PA-C sent to [facility] in [place] per pt's request.

## 2022-01-14 NOTE — Telephone Encounter (Signed)
CBC ordered per D. Idolina Primer, PA-C

## 2022-01-15 ENCOUNTER — Encounter (HOSPITAL_COMMUNITY): Payer: Self-pay | Admitting: Physical Therapy

## 2022-01-15 ENCOUNTER — Ambulatory Visit (HOSPITAL_COMMUNITY): Payer: Medicare Other | Attending: Internal Medicine | Admitting: Physical Therapy

## 2022-01-15 DIAGNOSIS — M6281 Muscle weakness (generalized): Secondary | ICD-10-CM | POA: Diagnosis not present

## 2022-01-15 DIAGNOSIS — R296 Repeated falls: Secondary | ICD-10-CM | POA: Diagnosis not present

## 2022-01-15 NOTE — Therapy (Signed)
OUTPATIENT PHYSICAL THERAPY LOWER EXTREMITY EVALUATION   Patient Name: Martin [name] MRN: [id number] DOB:Nov 10, 1955, 66 y.o., male Today's Date: 01/15/2022   PT End of Session - 01/15/22 1650     Visit Number 1    Number of Visits 12    Date for PT Re-Evaluation 03/12/22   pt will not start for 2 weeks,(until he is able to take his walking shoe off)   Authorization Type [employer] medicare    Progress Note Due on Visit 10    PT Start Time 1600    PT Stop Time 1638    PT Time Calculation (min) 38 min    Activity Tolerance Patient tolerated treatment well    Behavior During Therapy [facility] for tasks assessed/performed             Past Medical History:  Diagnosis Date   (HFpEF) heart failure with preserved ejection fraction (HCC)    CAD (coronary artery disease)    a. s/p PCI to LAD 2001. b. s/p stenting to the RCA and mid LAD December 2011, residual distal RCA disease treated medically. c.  Multivessel disease status post CABG October 2020   Carotid artery disease ([place])    Essential hypertension    GERD (gastroesophageal reflux disease)    Habitual alcohol use    Hyperlipidemia    Low back pain    Myocardial infarction ([place]) 2020   OSA on CPAP    Osteoarthritis    Permanent atrial fibrillation ([place])    a. dx 10/2017   Polysubstance abuse ([place])    History of marijuana and Vicodin abuse   PVC's (premature ventricular contractions)    Seasonal allergies    Stroke [facility]) 2020   Past Surgical History:  Procedure Laterality Date   CARDIOVERSION N/A 11/19/2017   Procedure: CARDIOVERSION;  Surgeon: Herminio Commons, MD;  [facility];  Service: Cardiovascular;  Laterality: N/A;   COLONOSCOPY WITH PROPOFOL N/A 03/01/2020   Procedure: COLONOSCOPY WITH PROPOFOL;  Surgeon: Harvel Quale, MD;  [facility];  Service: Gastroenterology;  Laterality: N/A;  730   CORONARY ANGIOPLASTY WITH STENT PLACEMENT  2011   CORONARY ARTERY BYPASS GRAFT N/A 03/10/2019    Procedure: CORONARY ARTERY BYPASS GRAFTING (CABG)X 3 Using Left Internal Mammary Artery and Right Saphenous Vein Grafts;  Surgeon: Ivin Poot, MD;  Location: [place];  Service: Open Heart Surgery;  Laterality: N/A;   ESOPHAGOGASTRODUODENOSCOPY (EGD) WITH PROPOFOL N/A 03/01/2020   Procedure: ESOPHAGOGASTRODUODENOSCOPY (EGD) WITH PROPOFOL;  Surgeon: Harvel Quale, MD;  [facility];  Service: Gastroenterology;  Laterality: N/A;   LEFT HEART CATH AND CORONARY ANGIOGRAPHY N/A 03/08/2019   Procedure: LEFT HEART CATH AND CORONARY ANGIOGRAPHY;  Surgeon: Belva Crome, MD;  Location: [place] [facility];  Service: Cardiovascular;  Laterality: N/A;   POLYPECTOMY  03/01/2020   Procedure: POLYPECTOMY;  Surgeon: Harvel Quale, MD;  [facility];  Service: Gastroenterology;;   TEE WITHOUT CARDIOVERSION N/A 03/10/2019   Procedure: TRANSESOPHAGEAL ECHOCARDIOGRAM (TEE);  Surgeon: Prescott Gum, Collier Salina, MD;  Location: [place];  Service: Open Heart Surgery;  Laterality: N/A;   TONSILLECTOMY     Patient Active Problem List   Diagnosis Date Noted   Gout 05/22/2021   Positive fecal occult blood test 01/25/2020   Anemia 01/25/2020   Left leg weakness 04/26/2019   Generalized anxiety disorder    Peripheral edema    Morbid obesity (HCC)    Acute gout of right ankle  Hypoalbuminemia due to protein-calorie malnutrition (HCC)    Acute blood loss anemia    OSA (obstructive sleep apnea)    Chronic diastolic congestive heart failure ([place])    Debility 03/27/2019   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 ([place]) 03/27/2019   Lethargy    Persistent atrial fibrillation ([place])    Cerebral embolism with cerebral infarction 03/13/2019   Hx of CABG 03/10/2019   NSTEMI (non-ST elevated myocardial infarction) ([place]) 03/07/2019   Unstable angina ([place]) 03/07/2019   Anxiety 04/23/2018   Difficulty sleeping 01/06/2018   Seasonal allergies 01/04/2018   OSA  on CPAP 05/27/2016   Obesity 06/10/2011   Insomnia 06/10/2011   PVC's (premature ventricular contractions)    CAD (coronary artery disease)    Carotid artery disease ([place])    DEGENERATIVE JOINT DISEASE 07/28/2010   Dyslipidemia 06/17/2010   SUBSTANCE ABUSE, MULTIPLE 06/17/2010   Essential hypertension 06/17/2010   CAD 06/17/2010   BRADYCARDIA 06/17/2010    PCP: Allyn Kenner  REFERRING PROVIDER: Allyn Kenner  REFERRING DIAG:  PT eval/tx for R29.6 repeated falls per Allyn Kenner, MD  THERAPY DIAG:  History of multiple falls  Rationale for Evaluation and Treatment Rehabilitation  ONSET DATE: 07/09/2021  SUBJECTIVE:   SUBJECTIVE STATEMENT: Mr. Miler states that he has been going to cardio rehab for several years, it is helping him with his stamina but he continues to have decreased balance.   He needs to work on his balance as it has been off ever since he had his stroke.  His was taking care of his wife for 18 months but she passed 6 months ago, he now wants to concentrate on himself so he can remain independent. He has stubbed his toe and the podiatrist put him in a walking shoe.  He gets that he is going to get the walking shoe off on 8/22.  PT will start therapy after this date as he will not be able to complete WB activities.   PERTINENT HISTORY: CVA, PVC, CABG, poly substance abuse  PAIN:  Are you having pain? No  PRECAUTIONS: None  WEIGHT BEARING RESTRICTIONS No  FALLS:  Has patient fallen in last 6 months? Yes. Number of falls 2  LIVING ENVIRONMENT: Lives with: lives alone Lives in: House/apartment Stairs: Yes: Internal: 2 steps; on right going up Has following equipment at home: None  OCCUPATION: retired   PLOF: [place] not to fall,   OBJECTIVE:   DIAGNOSTIC FINDINGS:  IMPRESSION: 1. Unchanged appearance of subacute right ACA as well as ACA/MCA watershed territory infarcts. No new acute infarction is demonstrated. No intracranial  hemorrhage. 2. Stable atrophy and chronic small vessel ischemic disease.     Electronically Signed   By: Kellie Simmering   On: 03/22/2019 17:13      COGNITION:  Overall cognitive status: Within functional limits for tasks assessed       POSTURE: rounded shoulders, forward head, decreased lumbar lordosis, increased thoracic kyphosis, and flexed trunk    LOWER EXTREMITY MMT:  MMT Right eval Left eval  Hip flexion 5/5 4+/5  Hip extension 3/5 3-/5  Hip abduction 5/5 3/5  Hip adduction    Hip internal rotation    Hip external rotation    Knee flexion 5/5 4+  Knee extension 5/5 5/5  Ankle dorsiflexion 4/5 5/5  Ankle plantarflexion    Ankle inversion    Ankle eversion     (Blank rows = not tested)    FUNCTIONAL  TESTS:  30 seconds chair stand test: 10 2 minute walk test: deferred due to walking shoe  Single leg stance:  Rt:  2" , Lt:  deferred due to walking shoe     TODAY'S TREATMENT: Evaluation    PATIENT EDUCATION:  Education details: HEP Person educated: Patient Education method: Explanation Education comprehension: returned demonstration   HOME EXERCISE PROGRAM: Side lying hip abduction :  5x Supine:  bridge 2 sets of 10  Sit to stand x 10   ASSESSMENT:  CLINICAL IMPRESSION: Patient is a 66 y.o. male  who was seen today for physical therapy evaluation and treatment for multiple falls. Mr. Mcpheeters evaluation demonstrates decreased activity tolerance, decreased strength and decrease balance.  Mr. Grell will benefit from skilled PT to address these issues and maximize his functional ability.    OBJECTIVE IMPAIRMENTS decreased activity tolerance, decreased balance, difficulty walking, and decreased strength.   ACTIVITY LIMITATIONS carrying, lifting, and locomotion level  PARTICIPATION LIMITATIONS: community activity  PERSONAL FACTORS Age, Time since onset of injury/illness/exacerbation, and 3+ comorbidities: CVA, brady cardio, substance abuse,  CABG  are also affecting patient's functional outcome.   REHAB POTENTIAL: Good  CLINICAL DECISION MAKING: Evolving/moderate complexity  EVALUATION COMPLEXITY: Moderate   GOALS: Goals reviewed with patient? No  SHORT TERM GOALS: Target date: 02/26/2022  PT to be I in HEP to decrease risk of falling  Baseline: Goal status: INITIAL  2.  Pt mm strength to improve 1/2 grade to allow pt to come sit to stand 12  in 30 seconds to demonstrate improved power.   Baseline:  Goal status: INITIAL  3.  Pt to be able to single leg stance on both LE for 10 seconds to reduce risk of falling  Baseline:  Goal status: INITIAL   LONG TERM GOALS: Target date: 03/12/2022   PT to be I in advanced HEP to improve mm strength by one grade to allow improved mobility Baseline:  Goal status: INITIAL  2.  Pt to be able to go up and down 8 steps with one handrail assist  Baseline:  Goal status: INITIAL  3.  Pt to be able to single leg stance on both LE for 20 " to reduce risk of falling.  Baseline:  Goal status: INITIAL    PLAN: PT FREQUENCY: 2x/week  PT DURATION: 6 weeks  PLANNED INTERVENTIONS: Therapeutic exercises, Therapeutic activity, Neuromuscular re-education, Balance training, Gait training, Patient/Family education, Self Care, and Manual therapy  PLAN FOR NEXT SESSION: Due to walking shoe pt unable to complete 2 minutes walk test please complete this.  Progress strengthening and balance.  Rayetta Humphrey, PT CLT [phone]  01/15/2022, 4:52 PM

## 2022-01-20 DIAGNOSIS — H524 Presbyopia: Secondary | ICD-10-CM | POA: Diagnosis not present

## 2022-01-20 DIAGNOSIS — H52223 Regular astigmatism, bilateral: Secondary | ICD-10-CM | POA: Diagnosis not present

## 2022-01-20 DIAGNOSIS — H5213 Myopia, bilateral: Secondary | ICD-10-CM | POA: Diagnosis not present

## 2022-01-22 DIAGNOSIS — G4733 Obstructive sleep apnea (adult) (pediatric): Secondary | ICD-10-CM | POA: Diagnosis not present

## 2022-01-26 ENCOUNTER — Ambulatory Visit: Payer: Medicare Other | Admitting: Pulmonary Disease

## 2022-01-26 ENCOUNTER — Encounter: Payer: Self-pay | Admitting: Pulmonary Disease

## 2022-01-26 ENCOUNTER — Ambulatory Visit (HOSPITAL_COMMUNITY)
Admission: RE | Admit: 2022-01-26 | Discharge: 2022-01-26 | Disposition: A | Discharge status: Home / Self Care | Payer: Medicare Other | Source: Ambulatory Visit | Attending: Pulmonary Disease | Admitting: Pulmonary Disease

## 2022-01-26 VITALS — BP 136/78 | HR 64 | Temp 98.7°F | Ht 66.0 in | Wt 240.8 lb

## 2022-01-26 DIAGNOSIS — R0602 Shortness of breath: Secondary | ICD-10-CM | POA: Insufficient documentation

## 2022-01-26 DIAGNOSIS — G4733 Obstructive sleep apnea (adult) (pediatric): Secondary | ICD-10-CM

## 2022-01-26 DIAGNOSIS — Z9989 Dependence on other enabling machines and devices: Secondary | ICD-10-CM

## 2022-01-26 DIAGNOSIS — J9811 Atelectasis: Secondary | ICD-10-CM | POA: Diagnosis not present

## 2022-01-26 DIAGNOSIS — T148XXA Other injury of unspecified body region, initial encounter: Secondary | ICD-10-CM | POA: Diagnosis not present

## 2022-01-26 DIAGNOSIS — J90 Pleural effusion, not elsewhere classified: Secondary | ICD-10-CM | POA: Insufficient documentation

## 2022-01-26 NOTE — Assessment & Plan Note (Signed)
CPAP download was reviewed which shows residual AHI of 19/hour on CPAP of 12 cm with good compliance and large leak.  Over the last 2 weeks, AHI seems to be much lower less than 10/hour and leak is improved indicating that he had changed to a new mask We will change to auto settings 12 to 15 cm  Weight loss encouraged, compliance with goal of at least 4-6 hrs every night is the expectation. Advised against medications with sedative side effects Cautioned against driving when sleepy - understanding that sleepiness will vary on a day to day basis

## 2022-01-26 NOTE — Progress Notes (Signed)
   Subjective:    Patient ID: Martin [name], male    DOB: [birth year], 66 y.o.   MRN: [id number]  HPI  66 yo obese man with CAD for FU of obstructive sleep apnea. He had a  sleep study in 2001 when he had a heart attack and maintained on CPAP since then.  He got a new CPAP in 2017 12 cm   PMH - CABG 2020 ,CVA permanent atrial fibrillation (dx 10/2017),  chronic HFpEF (EF 50-55%),   habitual alcohol use, prior street use of THC/Vicodin Wife had ALS , passed 06/2021   Chief Complaint  Patient presents with   Follow-up    Feeling better since visit with Dr. Idolina Primer  Got CXR done this morning  CPAP working well.     Saw Dayna Dunn APP with cards 7/6, C?o dyspnea, CXR RLL infx No clinical symptoms of pneumonia  - no fevers, chills, cough  reviewed CT chest.  Right lower lobe infiltrate shows air bronchograms, very atypical appearance. He has fluid in esophagus so this could possibly be aspiration pneumonia especially if he is not very symptomatic  Rxed with Levaquin x 7 ds Chest x-ray today shows persistent right lower lobe effusion/?  Infiltrate Review of previous imaging shows right effusion/2023 and chest x-ray from 05/2019 appears clear  He again denies coughing, fever or chills.  He is compliant with diuretics. Denies any problems with mask or pressure Admits to drinking EtOH, sometimes heavily  Significant tests/ events reviewed NPSG 04/2016 [facility] showed AHI of 29/hour with lowest desaturation of 86%   Review of Systems neg for any significant sore throat, dysphagia, itching, sneezing, nasal congestion or excess/ purulent secretions, fever, chills, sweats, unintended wt loss, pleuritic or exertional cp, hempoptysis, orthopnea pnd or change in chronic leg swelling. Also denies presyncope, palpitations, heartburn, abdominal pain, nausea, vomiting, diarrhea or change in bowel or urinary habits, dysuria,hematuria, rash, arthralgias, visual complaints, headache, numbness  weakness or ataxia.     Objective:   Physical Exam  Gen. Pleasant, obese, in no distress ENT - no lesions, no post nasal drip Neck: No JVD, no thyromegaly, no carotid bruits Lungs: no use of accessory muscles, no dullness to percussion, decreased without rales or rhonchi  Cardiovascular: Rhythm regular, heart sounds  normal, no murmurs or gallops, no peripheral edema Musculoskeletal: No deformities, no cyanosis or clubbing , no tremors        Assessment & Plan:

## 2022-01-26 NOTE — Assessment & Plan Note (Addendum)
Right lower lobe pneumonia on CT scan was treated with Levaquin for 7 days.  He seems to have a persistent right effusion which dates back to April indicating that this is likely related to heart failure. We will proceed with thoracentesis to better define. He is on apixaban so this will have to be held for 3 days prior to procedure. We will send for detailed testing including cytology  I discussed risks and benefits of the procedure including that of lung puncture requiring chest tube and bleeding

## 2022-01-26 NOTE — Patient Instructions (Signed)
  X Change to autoCPAP 12-15 cm You have large leak on your machine  X You have fluid around your lungs Plan to remove this  send for testing

## 2022-01-27 DIAGNOSIS — E782 Mixed hyperlipidemia: Secondary | ICD-10-CM | POA: Diagnosis not present

## 2022-01-27 DIAGNOSIS — R7301 Impaired fasting glucose: Secondary | ICD-10-CM | POA: Diagnosis not present

## 2022-01-27 DIAGNOSIS — M79675 Pain in left toe(s): Secondary | ICD-10-CM | POA: Diagnosis not present

## 2022-01-27 DIAGNOSIS — L89892 Pressure ulcer of other site, stage 2: Secondary | ICD-10-CM | POA: Diagnosis not present

## 2022-01-27 DIAGNOSIS — D509 Iron deficiency anemia, unspecified: Secondary | ICD-10-CM | POA: Diagnosis not present

## 2022-01-27 DIAGNOSIS — M79672 Pain in left foot: Secondary | ICD-10-CM | POA: Diagnosis not present

## 2022-01-29 ENCOUNTER — Ambulatory Visit (HOSPITAL_COMMUNITY)
Admission: RE | Admit: 2022-01-29 | Discharge: 2022-01-29 | Disposition: A | Discharge status: Home / Self Care | Payer: Medicare Other | Source: Ambulatory Visit | Attending: Pulmonary Disease | Admitting: Pulmonary Disease

## 2022-01-29 ENCOUNTER — Ambulatory Visit (HOSPITAL_COMMUNITY)
Admission: RE | Admit: 2022-01-29 | Discharge: 2022-01-29 | Disposition: A | Discharge status: Home / Self Care | Payer: Medicare Other | Source: Ambulatory Visit | Attending: Radiology | Admitting: Radiology

## 2022-01-29 ENCOUNTER — Ambulatory Visit (HOSPITAL_COMMUNITY): Payer: Medicare Other | Admitting: Physical Therapy

## 2022-01-29 ENCOUNTER — Encounter (HOSPITAL_COMMUNITY): Payer: Self-pay

## 2022-01-29 VITALS — BP 115/71 | HR 69 | Temp 98.0°F | Resp 18

## 2022-01-29 DIAGNOSIS — J90 Pleural effusion, not elsewhere classified: Secondary | ICD-10-CM | POA: Diagnosis not present

## 2022-01-29 DIAGNOSIS — I509 Heart failure, unspecified: Secondary | ICD-10-CM | POA: Diagnosis not present

## 2022-01-29 DIAGNOSIS — R091 Pleurisy: Secondary | ICD-10-CM | POA: Diagnosis not present

## 2022-01-29 DIAGNOSIS — J9811 Atelectasis: Secondary | ICD-10-CM | POA: Diagnosis not present

## 2022-01-29 LAB — BODY FLUID CELL COUNT WITH DIFFERENTIAL
Eos, Fluid: 3 %
Lymphs, Fluid: 73 %
Monocyte-Macrophage-Serous Fluid: 22 % — ABNORMAL LOW (ref 50–90)
Neutrophil Count, Fluid: 2 % (ref 0–25)
Total Nucleated Cell Count, Fluid: 927 cu mm (ref 0–1000)

## 2022-01-29 LAB — GRAM STAIN

## 2022-01-29 LAB — LACTATE DEHYDROGENASE, PLEURAL OR PERITONEAL FLUID: LD, Fluid: 212 U/L — ABNORMAL HIGH (ref 3–23)

## 2022-01-29 LAB — GLUCOSE, PLEURAL OR PERITONEAL FLUID: Glucose, Fluid: 88 mg/dL

## 2022-01-29 LAB — PROTEIN, PLEURAL OR PERITONEAL FLUID: Total protein, fluid: 3.8 g/dL

## 2022-01-29 LAB — ALBUMIN, PLEURAL OR PERITONEAL FLUID: Albumin, Fluid: 2.4 g/dL

## 2022-01-29 MED ORDER — LIDOCAINE HCL (PF) 2 % IJ SOLN
INTRAMUSCULAR | Status: AC
  Filled 2022-01-29: qty 10

## 2022-01-29 NOTE — Procedures (Signed)
   US guided Rt thoracentesis  550 cc blood tinged fluid Sent for labs per MD  Tolerated well EBL: None  Post CXR: NO PTX

## 2022-01-29 NOTE — Progress Notes (Signed)
Patient tolerated right sided thoracentesis procedure well today and 550 mL of serosanguinous fluid removed and sent to lab for processing. Pt taken for post chest xray via stretcher and xray read by radiologist prior to patient's departure. PT verbalized understanding of post procedure instructions and ambulatory at discharge with no acute distress noted.

## 2022-01-30 ENCOUNTER — Ambulatory Visit (HOSPITAL_COMMUNITY): Admission: RE | Admit: 2022-01-30 | Payer: Medicare Other | Source: Ambulatory Visit

## 2022-01-30 LAB — TRIGLYCERIDES, BODY FLUIDS: Triglycerides, Fluid: 22 mg/dL

## 2022-02-02 ENCOUNTER — Encounter: Payer: Self-pay | Admitting: Internal Medicine

## 2022-02-02 DIAGNOSIS — R7301 Impaired fasting glucose: Secondary | ICD-10-CM | POA: Diagnosis not present

## 2022-02-02 DIAGNOSIS — G4733 Obstructive sleep apnea (adult) (pediatric): Secondary | ICD-10-CM | POA: Diagnosis not present

## 2022-02-02 DIAGNOSIS — K922 Gastrointestinal hemorrhage, unspecified: Secondary | ICD-10-CM | POA: Diagnosis not present

## 2022-02-02 DIAGNOSIS — I2510 Atherosclerotic heart disease of native coronary artery without angina pectoris: Secondary | ICD-10-CM | POA: Diagnosis not present

## 2022-02-02 DIAGNOSIS — I110 Hypertensive heart disease with heart failure: Secondary | ICD-10-CM | POA: Diagnosis not present

## 2022-02-02 DIAGNOSIS — E782 Mixed hyperlipidemia: Secondary | ICD-10-CM | POA: Diagnosis not present

## 2022-02-02 DIAGNOSIS — I10 Essential (primary) hypertension: Secondary | ICD-10-CM | POA: Diagnosis not present

## 2022-02-02 DIAGNOSIS — D509 Iron deficiency anemia, unspecified: Secondary | ICD-10-CM | POA: Diagnosis not present

## 2022-02-02 DIAGNOSIS — I4891 Unspecified atrial fibrillation: Secondary | ICD-10-CM | POA: Diagnosis not present

## 2022-02-02 DIAGNOSIS — Z0001 Encounter for general adult medical examination with abnormal findings: Secondary | ICD-10-CM | POA: Diagnosis not present

## 2022-02-02 LAB — CYTOLOGY - NON PAP

## 2022-02-03 ENCOUNTER — Encounter (HOSPITAL_COMMUNITY): Payer: Medicare Other | Admitting: Physical Therapy

## 2022-02-03 LAB — CULTURE, BODY FLUID W GRAM STAIN -BOTTLE: Culture: NO GROWTH

## 2022-02-04 ENCOUNTER — Other Ambulatory Visit: Payer: Self-pay | Admitting: Internal Medicine

## 2022-02-05 ENCOUNTER — Encounter (HOSPITAL_COMMUNITY): Payer: Medicare Other | Admitting: Physical Therapy

## 2022-02-09 NOTE — Progress Notes (Signed)
Cardiology Office Note  Date: 02/10/2022   ID: Martin [name] May 19, 1956, MRN [id number]  PCP:  [name], MD  Cardiologist:  Rozann Lesches, MD Electrophysiologist:  [name], MD   Chief Complaint  Patient presents with   Cardiac follow-up    History of Present Illness: Martin [name] is a 66 y.o. male last seen in July by [name] PA-C, I reviewed the note.  I reviewed his lab work and interval testing as noted below.  He is here for follow-up today, reports no significant weight change, no orthopnea or PND, stable NYHA class II dyspnea.  He did have a visit with Dr. [name] on August 21.  He had had evidence of right lower lobe pneumonia by chest CT, treated with Levaquin had a persistent right pleural effusion that was managed with thoracentesis.  CPAP treatment of OSA was also addressed at that visit.  Echocardiogram in July showed LVEF 40 to 45% with wall motion abnormalities consistent with ischemic cardiomyopathy, severe biatrial enlargement, mild to moderate aortic stenosis with mean gradient 12.5 mmHg.  We went over his medications today.  Discussed adjustment in Lasix based on weight change.  Otherwise no up titration in remainder of his medications made given current vital signs.  Past Medical History:  Diagnosis Date   (HFpEF) heart failure with preserved ejection fraction (HCC)    CAD (coronary artery disease)    a. s/p PCI to LAD 2001. b. s/p stenting to the RCA and mid LAD December 2011, residual distal RCA disease treated medically. c.  Multivessel disease status post CABG October 2020   Carotid artery disease ([place])    Essential hypertension    GERD (gastroesophageal reflux disease)    Habitual alcohol use    Hyperlipidemia    Low back pain    Myocardial infarction ([place]) 2020   OSA on CPAP    Osteoarthritis    Permanent atrial fibrillation ([place])    a. dx 10/2017   Polysubstance abuse ([place])    History of marijuana and Vicodin abuse   PVC's  (premature ventricular contractions)    Seasonal allergies    Stroke [facility]) 2020    Past Surgical History:  Procedure Laterality Date   CARDIOVERSION N/A 11/19/2017   Procedure: CARDIOVERSION;  Surgeon: Herminio Commons, MD;  [facility];  Service: Cardiovascular;  Laterality: N/A;   COLONOSCOPY WITH PROPOFOL N/A 03/01/2020   Procedure: COLONOSCOPY WITH PROPOFOL;  Surgeon: Harvel Quale, MD;  [facility];  Service: Gastroenterology;  Laterality: N/A;  730   CORONARY ANGIOPLASTY WITH STENT PLACEMENT  2011   CORONARY ARTERY BYPASS GRAFT N/A 03/10/2019   Procedure: CORONARY ARTERY BYPASS GRAFTING (CABG)X 3 Using Left Internal Mammary Artery and Right Saphenous Vein Grafts;  Surgeon: Ivin Poot, MD;  Location: [place];  Service: Open Heart Surgery;  Laterality: N/A;   ESOPHAGOGASTRODUODENOSCOPY (EGD) WITH PROPOFOL N/A 03/01/2020   Procedure: ESOPHAGOGASTRODUODENOSCOPY (EGD) WITH PROPOFOL;  Surgeon: Harvel Quale, MD;  [facility];  Service: Gastroenterology;  Laterality: N/A;   LEFT HEART CATH AND CORONARY ANGIOGRAPHY N/A 03/08/2019   Procedure: LEFT HEART CATH AND CORONARY ANGIOGRAPHY;  Surgeon: Belva Crome, MD;  Location: [place] CV LAB;  Service: Cardiovascular;  Laterality: N/A;   POLYPECTOMY  03/01/2020   Procedure: POLYPECTOMY;  Surgeon: Harvel Quale, MD;  [facility];  Service: Gastroenterology;;   TEE WITHOUT CARDIOVERSION N/A 03/10/2019   Procedure: TRANSESOPHAGEAL ECHOCARDIOGRAM (TEE);  Surgeon: Prescott Gum, Collier Salina, MD;  Location: [place];  Service: Open Heart Surgery;  Laterality: N/A;   TONSILLECTOMY      Current Outpatient Medications  Medication Sig Dispense Refill   allopurinol (ZYLOPRIM) 300 MG tablet Take 300 mg by mouth daily.      atorvastatin (LIPITOR) 80 MG tablet Take 1 tablet (80 mg total) by mouth daily. 90 tablet 1   Cholecalciferol (VITAMIN D-3) 1000 UNITS CAPS Take 1,000 Units by  mouth every morning.      colchicine 0.6 MG tablet Take 1 tablet (0.6 mg total) by mouth daily at 6 PM. (Patient taking differently: Take 0.6 mg by mouth as needed.) 30 tablet 1   Cyanocobalamin (VITAMIN B-12) 2500 MCG SUBL Place 2,500 mcg under the tongue every morning.      diclofenac Sodium (VOLTAREN) 1 % GEL Takes only as needed     docusate sodium (COLACE) 100 MG capsule Take 2 capsules (200 mg total) by mouth daily. (Patient taking differently: Take 100 mg by mouth at bedtime as needed.) 60 capsule 0   ELIQUIS 5 MG TABS tablet TAKE 1 TABLET BY MOUTH TWICE A DAY (Patient taking differently: Take 5 mg by mouth 2 (two) times daily.) 180 tablet 2   empagliflozin (JARDIANCE) 10 MG TABS tablet Take 1 tablet (10 mg total) by mouth daily before breakfast. 30 tablet 6   fluticasone (FLONASE) 50 MCG/ACT nasal spray Place 2 sprays into the nose daily.     folic acid (FOLVITE) 1 MG tablet TAKE ONE TABLET BY MOUTH EVERY DAY (Patient taking differently: Take 1 mg by mouth daily.) 30 tablet 6   furosemide (LASIX) 40 MG tablet TAKE ONE TABLET BY MOUTH EVERY DAY 90 tablet 1   HYDROcodone-acetaminophen (NORCO) 10-325 MG tablet Take 1 tablet by mouth every 6 (six) hours as needed.     KLOR-CON M15 15 MEQ tablet TAKE 2 TABLETS BY MOUTH DAILY 180 tablet 3   loratadine (CLARITIN) 10 MG tablet Take 10 mg by mouth daily.     LORazepam (ATIVAN) 0.5 MG tablet Take 0.5 mg by mouth at bedtime.      meclizine (ANTIVERT) 32 MG tablet Take 32 mg by mouth daily as needed for dizziness.      nitroGLYCERIN (NITROSTAT) 0.4 MG SL tablet Place 1 tablet (0.4 mg total) under the tongue every 5 (five) minutes as needed for chest pain. 25 tablet 3   pantoprazole (PROTONIX) 40 MG tablet Take 1 tablet (40 mg total) by mouth daily. 30 tablet 0   polyethylene glycol (MIRALAX / GLYCOLAX) packet Take 17 g by mouth at bedtime as needed for mild constipation.     sacubitril-valsartan (ENTRESTO) 24-26 MG Take 1 tablet by mouth 2 (two) times  daily. 60 tablet 5   spironolactone (ALDACTONE) 25 MG tablet Take 0.5 tablets (12.5 mg total) by mouth daily. 45 tablet 3   tamsulosin (FLOMAX) 0.4 MG CAPS capsule Take 0.4 mg by mouth at bedtime.     No current facility-administered medications for this visit.   Allergies:  Penicillins   ROS: No palpitations or unexplained syncope.  Physical Exam: VS:  BP 100/60   Pulse 73   Ht 5' 6.5" (1.689 m)   Wt 240 lb 6.4 oz (109 kg)   SpO2 95%   BMI 38.22 kg/m , BMI Body mass index is 38.22 kg/m.  Wt Readings from Last 3 Encounters:  02/10/22 240 lb 6.4 oz (109 kg)  01/26/22 240 lb 12.8 oz (109.2 kg)  01/06/22  245 lb (111.1 kg)    General: Patient appears comfortable at rest. HEENT: Conjunctiva and lids normal. Neck: Supple, no elevated JVP or carotid bruits, no thyromegaly. Lungs: Clear to auscultation, nonlabored breathing at rest. Cardiac: Irregularly irregular, no S3 or significant systolic murmur. Extremities: Compression socks in place.  ECG:  An ECG dated 12/11/2021 was personally reviewed today and demonstrated:  Rate controlled atrial fibrillation with nonspecific ST-T changes.  Recent Labwork: 01/14/2022: B Natriuretic Peptide 255.0; BUN 19; Creatinine, Ser 0.71; Hemoglobin 13.5; Platelets 184; Potassium 4.3; Sodium 136     Component Value Date/Time   CHOL 170 03/07/2019 2358   TRIG 218 (H) 03/07/2019 2358   HDL 41 03/07/2019 2358   CHOLHDL 4.1 03/07/2019 2358   VLDL 44 (H) 03/07/2019 2358   LDLCALC 85 03/07/2019 2358    Other Studies Reviewed Today:  Lexiscan Myoview 11/11/2021:   Findings are consistent with prior myocardial infarction. The study is low risk.   No ST deviation was noted.   LV perfusion is abnormal. There is no evidence of ischemia. There is evidence of infarction. Defect 1: There is a small defect with mild reduction in uptake present in the apical to basal inferior location(s) that is fixed. There is abnormal wall motion in the defect area. Consistent  with infarction.   Left ventricular function is abnormal. Global function is moderately reduced. End diastolic cavity size is normal. End systolic cavity size is normal.   Small inferior wall infarct from apex to base no ischemia EF estimated at 42% but patient in afib   Echocardiogram 12/23/2021:  1. Anteroseptal wall, apex are hypokinetic. Marland Kitchen Left ventricular ejection  fraction, by estimation, is 40 to 45%. The left ventricle has mildly  decreased function. The left ventricle demonstrates regional wall motion  abnormalities (see scoring  diagram/findings for description). Left ventricular diastolic parameters  are indeterminate.   2. Right ventricular systolic function is normal. The right ventricular  size is normal. Tricuspid regurgitation signal is inadequate for assessing  PA pressure.   3. Left atrial size was severely dilated.   4. Right atrial size was severely dilated.   5. The mitral valve is normal in structure. Trivial mitral valve  regurgitation. No evidence of mitral stenosis.   6. The tricuspid valve is abnormal.   7. The aortic valve is tricuspid. There is mild calcification of the  aortic valve. There is mild thickening of the aortic valve. Aortic valve  regurgitation is mild. Mild to moderate aortic valve stenosis. Lower than  expected AV gradients in setting of  SVI 28      Aortic valve mean gradient measures 12.5 mmHg. Aortic valve peak  gradient measures 23.3 mmHg. Aortic valve area, by VTI measures 1.39 cm.   8. The inferior vena cava is normal in size with greater than 50%  respiratory variability, suggesting right atrial pressure of 3 mmHg.   Assessment and Plan:  1.  HFmrEF, LVEF 40 to 45% with wall motion abnormalities consistent with ischemic cardiomyopathy.  He does not report any angina symptoms or nitroglycerin use.  Weight is stable and he describes NYHA class II dyspnea at this time.  Continue Entresto, Jardiance, Aldactone, and Lasix with potassium  supplement.  He is not on beta-blocker given history of symptomatic bradycardia.  2.  CAD status post CABG in October 2020.  Reports no active angina.  Myoview in June showed no large ischemic territories with evidence of small inferior infarct scar.  Continue Lipitor.  He is not  on aspirin given use of Eliquis.  3.  Permanent atrial fibrillation with CHA2DS2-VASc score of 4.  Continue Eliquis for stroke prophylaxis.  Recent lab work reviewed.  Medication Adjustments/Labs and Tests Ordered: Current medicines are reviewed at length with the patient today.  Concerns regarding medicines are outlined above.   Tests Ordered: No orders of the defined types were placed in this encounter.   Medication Changes: No orders of the defined types were placed in this encounter.   Disposition:  Follow up  4 months.  Signed, Satira Sark, MD, [facility] 02/10/2022 10:08 AM    [facility] [address], [place], [place] [address] Phone: [phone]; Fax: [phone]

## 2022-02-10 ENCOUNTER — Ambulatory Visit: Payer: Medicare Other | Attending: Cardiology | Admitting: Cardiology

## 2022-02-10 ENCOUNTER — Encounter: Payer: Self-pay | Admitting: Pulmonary Disease

## 2022-02-10 ENCOUNTER — Telehealth: Payer: Self-pay | Admitting: Pulmonary Disease

## 2022-02-10 ENCOUNTER — Encounter: Payer: Self-pay | Admitting: Cardiology

## 2022-02-10 VITALS — BP 100/60 | HR 73 | Ht 66.5 in | Wt 240.4 lb

## 2022-02-10 DIAGNOSIS — I25119 Atherosclerotic heart disease of native coronary artery with unspecified angina pectoris: Secondary | ICD-10-CM | POA: Diagnosis not present

## 2022-02-10 DIAGNOSIS — I429 Cardiomyopathy, unspecified: Secondary | ICD-10-CM | POA: Diagnosis not present

## 2022-02-10 DIAGNOSIS — M79671 Pain in right foot: Secondary | ICD-10-CM | POA: Diagnosis not present

## 2022-02-10 DIAGNOSIS — I4821 Permanent atrial fibrillation: Secondary | ICD-10-CM

## 2022-02-10 DIAGNOSIS — M25774 Osteophyte, right foot: Secondary | ICD-10-CM | POA: Diagnosis not present

## 2022-02-10 NOTE — Patient Instructions (Signed)
Medication Instructions:   Your physician recommends that you continue on your current medications as directed. Please refer to the Current Medication list given to you today.  Labwork:  none  Testing/Procedures:  none  Follow-Up:  Your physician recommends that you schedule a follow-up appointment in: 4 months.   Any Other Special Instructions Will Be Listed Below (If Applicable).  If you need a refill on your cardiac medications before your next appointment, please call your pharmacy. 

## 2022-02-11 ENCOUNTER — Telehealth: Payer: Self-pay | Admitting: Pulmonary Disease

## 2022-02-11 NOTE — Telephone Encounter (Signed)
I called Martin [name] to get clarification on the order.

## 2022-02-11 NOTE — Telephone Encounter (Signed)
Called and left message for Charlynne Cousins [phone] to call back to clarify ordered needed for patient.

## 2022-02-12 NOTE — Telephone Encounter (Signed)
Per Leroy Sea he was supposed to be getting something faxed for the provider to sign. Closing encounter.

## 2022-02-17 ENCOUNTER — Ambulatory Visit (HOSPITAL_COMMUNITY): Payer: Medicare Other | Attending: Cardiology | Admitting: Physical Therapy

## 2022-02-17 ENCOUNTER — Encounter (HOSPITAL_COMMUNITY): Payer: Medicare Other | Admitting: Physical Therapy

## 2022-02-17 DIAGNOSIS — R296 Repeated falls: Secondary | ICD-10-CM | POA: Insufficient documentation

## 2022-02-17 DIAGNOSIS — M6281 Muscle weakness (generalized): Secondary | ICD-10-CM | POA: Insufficient documentation

## 2022-02-17 DIAGNOSIS — R262 Difficulty in walking, not elsewhere classified: Secondary | ICD-10-CM | POA: Insufficient documentation

## 2022-02-17 NOTE — Therapy (Signed)
OUTPATIENT PHYSICAL THERAPY LOWER EXTREMITY Treatment    Patient Name: Martin [name] MRN: [id number] DOB:[birth year], 66 y.o., male Today's Date: 02/17/2022   PT End of Session - 02/17/22 1510    Visit Number 2    Number of Visits 12    Date for PT Re-Evaluation 03/12/22   pt will not start for 2 weeks,(until he is able to take his walking shoe off)   Authorization Type [employer] medicare    Progress Note Due on Visit 10    PT Start Time 1432    PT Stop Time 1510    PT Time Calculation (min) 38 min    Activity Tolerance Patient tolerated treatment well    Behavior During Therapy [facility] for tasks assessed/performed              Past Medical History:  Diagnosis Date   (HFpEF) heart failure with preserved ejection fraction (HCC)    CAD (coronary artery disease)    a. s/p PCI to LAD 2001. b. s/p stenting to the RCA and mid LAD December 2011, residual distal RCA disease treated medically. c.  Multivessel disease status post CABG October 2020   Carotid artery disease ([place])    Essential hypertension    GERD (gastroesophageal reflux disease)    Habitual alcohol use    Hyperlipidemia    Low back pain    Myocardial infarction ([place]) 2020   OSA on CPAP    Osteoarthritis    Permanent atrial fibrillation ([place])    a. dx 10/2017   Polysubstance abuse ([place])    History of marijuana and Vicodin abuse   PVC's (premature ventricular contractions)    Seasonal allergies    Stroke [facility]) 2020   Past Surgical History:  Procedure Laterality Date   CARDIOVERSION N/A 11/19/2017   Procedure: CARDIOVERSION;  Surgeon: Herminio Commons, MD;  [facility];  Service: Cardiovascular;  Laterality: N/A;   COLONOSCOPY WITH PROPOFOL N/A 03/01/2020   Procedure: COLONOSCOPY WITH PROPOFOL;  Surgeon: Harvel Quale, MD;  [facility];  Service: Gastroenterology;  Laterality: N/A;  730   CORONARY ANGIOPLASTY WITH STENT PLACEMENT  2011   CORONARY ARTERY BYPASS GRAFT N/A  03/10/2019   Procedure: CORONARY ARTERY BYPASS GRAFTING (CABG)X 3 Using Left Internal Mammary Artery and Right Saphenous Vein Grafts;  Surgeon: Ivin Poot, MD;  Location: [place];  Service: Open Heart Surgery;  Laterality: N/A;   ESOPHAGOGASTRODUODENOSCOPY (EGD) WITH PROPOFOL N/A 03/01/2020   Procedure: ESOPHAGOGASTRODUODENOSCOPY (EGD) WITH PROPOFOL;  Surgeon: Harvel Quale, MD;  [facility];  Service: Gastroenterology;  Laterality: N/A;   LEFT HEART CATH AND CORONARY ANGIOGRAPHY N/A 03/08/2019   Procedure: LEFT HEART CATH AND CORONARY ANGIOGRAPHY;  Surgeon: Belva Crome, MD;  Location: [place] CV LAB;  Service: Cardiovascular;  Laterality: N/A;   POLYPECTOMY  03/01/2020   Procedure: POLYPECTOMY;  Surgeon: Harvel Quale, MD;  [facility];  Service: Gastroenterology;;   TEE WITHOUT CARDIOVERSION N/A 03/10/2019   Procedure: TRANSESOPHAGEAL ECHOCARDIOGRAM (TEE);  Surgeon: Prescott Gum, Collier Salina, MD;  Location: [place];  Service: Open Heart Surgery;  Laterality: N/A;   TONSILLECTOMY     Patient Active Problem List   Diagnosis Date Noted   Pleural effusion on right 01/26/2022   Gout 05/22/2021   Positive fecal occult blood test 01/25/2020   Anemia 01/25/2020   Left leg weakness 04/26/2019   Generalized anxiety disorder    Peripheral edema    Morbid obesity ([place])  Acute gout of right ankle    Hypoalbuminemia due to protein-calorie malnutrition (HCC)    Acute blood loss anemia    Chronic diastolic congestive heart failure ([place])    Debility 03/27/2019   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) 03/07/2019   Unstable angina ([place]) 03/07/2019   Anxiety 04/23/2018   Difficulty sleeping 01/06/2018   Seasonal allergies  01/04/2018   OSA on CPAP 05/27/2016   Obesity 06/10/2011   Insomnia 06/10/2011   PVC's (premature ventricular contractions)    CAD (coronary artery disease)    Carotid artery disease ([place])    DEGENERATIVE JOINT DISEASE 07/28/2010   Dyslipidemia 06/17/2010   SUBSTANCE ABUSE, MULTIPLE 06/17/2010   Essential hypertension 06/17/2010   CAD 06/17/2010   BRADYCARDIA 06/17/2010    PCP: Allyn Kenner  REFERRING PROVIDER: Allyn Kenner  REFERRING DIAG:  PT eval/tx for R29.6 repeated falls per Allyn Kenner, MD  THERAPY DIAG:  History of multiple falls  Rationale for Evaluation and Treatment Rehabilitation  ONSET DATE: 07/09/2021  SUBJECTIVE:   SUBJECTIVE STATEMENT: Pt states that he had to have his boot on for four weeks, he got out of the boot last Tuesdayl.  PERTINENT HISTORY: CVA, PVC, CABG, poly substance abuse  PAIN:  Are you having pain? No  PRECAUTIONS: None  WEIGHT BEARING RESTRICTIONS No  FALLS:  Has patient fallen in last 6 months? Yes. Number of falls 2  LIVING ENVIRONMENT: Lives with: lives alone Lives in: House/apartment Stairs: Yes: Internal: 2 steps; on right going up Has following equipment at home: None  OCCUPATION: retired   PLOF: [place] not to fall,   OBJECTIVE:   DIAGNOSTIC FINDINGS:  IMPRESSION: 1. Unchanged appearance of subacute right ACA as well as ACA/MCA watershed territory infarcts. No new acute infarction is demonstrated. No intracranial hemorrhage. 2. Stable atrophy and chronic small vessel ischemic disease.     Electronically Signed   By: Kellie Simmering   On: 03/22/2019 17:13      COGNITION:  Overall cognitive status: Within functional limits for tasks assessed       POSTURE: rounded shoulders, forward head, decreased lumbar lordosis, increased thoracic kyphosis, and flexed trunk    LOWER EXTREMITY MMT:  MMT Right eval Left eval  Hip flexion 5/5 4+/5  Hip extension 3/5 3-/5  Hip abduction 5/5 3/5  Hip  adduction    Hip internal rotation    Hip external rotation    Knee flexion 5/5 4+  Knee extension 5/5 5/5  Ankle dorsiflexion 4/5 5/5  Ankle plantarflexion    Ankle inversion    Ankle eversion     (Blank rows = not tested)    FUNCTIONAL TESTS:  30 seconds chair stand test: 10 2 minute walk test: deferred due to walking shoe  Single leg stance:  Rt:  2" , Lt:  deferred due to walking shoe     TODAY'S TREATMENT:                        02/17/2022:                        Standing:  Heel raises x 10                        Functional squat x 10                        Tandem stance x 3 each                        Single leg stance x 3                        Side stepping x 2 rt at //                        Sitting:                        Sit to stand x 10                        Scapular retraction x 10                        Supine:                       Bridge x 10                        Active hamstring stretch 30" x 3                        Side lying:  Lt hip abduction x 15              PATIENT EDUCATION:  Education details: HEP Person educated: Patient Education method: Explanation Education comprehension: returned demonstration   HOME EXERCISE PROGRAM: 02/18/2023: [id number]:              Sitting scapular retraction x 10              Supine active hamstring stretch 30" x 3  Evaluation:  Side lying hip abduction :  5x Supine:  bridge 2 sets of 10  Sit to stand x 10   ASSESSMENT:  CLINICAL IMPRESSION:  Pt has not returned to the clinic since his evaluation on 01/15/09/23 due to having to have a boot on his foot.  The boot is now removed and treatment will Be starting.  Evaluation and goals reviewed with pt as well as beginning and strengthening program.   Mr. Peppard will benefit from skilled PT to address these issues and maximize his functional ability.    OBJECTIVE IMPAIRMENTS decreased activity tolerance, decreased balance, difficulty  walking, and decreased strength.   ACTIVITY LIMITATIONS carrying, lifting, and locomotion level  PARTICIPATION LIMITATIONS: community activity  PERSONAL FACTORS Age, Time since onset of injury/illness/exacerbation, and 3+ comorbidities: CVA, brady cardio, substance abuse, CABG  are also affecting patient's functional outcome.   REHAB POTENTIAL: Good  CLINICAL DECISION MAKING: Evolving/moderate complexity  EVALUATION COMPLEXITY: Moderate   GOALS: Goals reviewed with patient? No  SHORT TERM GOALS: Target date: 02/26/2022  PT to be I in HEP to decrease risk of falling  Baseline: Goal status: IN PROGRESS  2.  Pt mm strength to improve 1/2 grade to allow pt to come sit to stand 12  in 30 seconds  to demonstrate improved power.   Baseline:  Goal status: IN PROGRESS  3.  Pt to be able to single leg stance on both LE for 10 seconds to reduce risk of falling  Baseline:  Goal status: IN PROGRESS   LONG TERM GOALS: Target date: 03/12/2022   PT to be I in advanced HEP to improve mm strength by one grade to allow improved mobility Baseline:  Goal status: IN PROGRESS  2.  Pt to be able to go up and down 8 steps with one handrail assist  Baseline:  Goal status: IN PROGRESS  3.  Pt to be able to single leg stance on both LE for 20 " to reduce risk of falling.  Baseline:  Goal status: IN PROGRESS     PLAN: PT FREQUENCY: 2x/week  PT DURATION: 6 weeks  PLANNED INTERVENTIONS: Therapeutic exercises, Therapeutic activity, Neuromuscular re-education, Balance training, Gait training, Patient/Family education, Self Care, and Manual therapy  PLAN FOR NEXT SESSION: Due to walking shoe pt unable to complete 2 minutes walk test please complete this.  Progress strengthening and balance.  Rayetta Humphrey, PT CLT [phone]  02/17/2022, 3:20 PM

## 2022-02-19 ENCOUNTER — Ambulatory Visit (HOSPITAL_COMMUNITY): Payer: Medicare Other | Admitting: Physical Therapy

## 2022-02-19 ENCOUNTER — Encounter (HOSPITAL_COMMUNITY): Payer: Self-pay | Admitting: Physical Therapy

## 2022-02-19 DIAGNOSIS — M6281 Muscle weakness (generalized): Secondary | ICD-10-CM

## 2022-02-19 DIAGNOSIS — R262 Difficulty in walking, not elsewhere classified: Secondary | ICD-10-CM | POA: Diagnosis not present

## 2022-02-19 DIAGNOSIS — R296 Repeated falls: Secondary | ICD-10-CM

## 2022-02-19 NOTE — Therapy (Signed)
OUTPATIENT PHYSICAL THERAPY LOWER EXTREMITY Treatment    Patient Name: Martin [name] MRN: [id number] DOB:11-08-1955, 66 y.o., male Today's Date: 02/19/2022  END OF SESSION:   PT End of Session - 02/19/22 1435     Visit Number 3    Number of Visits 12    Date for PT Re-Evaluation 03/12/22   pt will not start for 2 weeks,(until he is able to take his walking shoe off)   Authorization Type [employer] medicare    Progress Note Due on Visit 10    PT Start Time 1434    PT Stop Time 1514    PT Time Calculation (min) 40 min    Activity Tolerance Patient tolerated treatment well    Behavior During Therapy [facility] for tasks assessed/performed              Past Medical History:  Diagnosis Date   (HFpEF) heart failure with preserved ejection fraction (HCC)    CAD (coronary artery disease)    a. s/p PCI to LAD 2001. b. s/p stenting to the RCA and mid LAD December 2011, residual distal RCA disease treated medically. c.  Multivessel disease status post CABG October 2020   Carotid artery disease ([place])    Essential hypertension    GERD (gastroesophageal reflux disease)    Habitual alcohol use    Hyperlipidemia    Low back pain    Myocardial infarction ([place]) 2020   OSA on CPAP    Osteoarthritis    Permanent atrial fibrillation ([place])    a. dx 10/2017   Polysubstance abuse ([place])    History of marijuana and Vicodin abuse   PVC's (premature ventricular contractions)    Seasonal allergies    Stroke [facility]) 2020   Past Surgical History:  Procedure Laterality Date   CARDIOVERSION N/A 11/19/2017   Procedure: CARDIOVERSION;  Surgeon: Herminio Commons, MD;  [facility];  Service: Cardiovascular;  Laterality: N/A;   COLONOSCOPY WITH PROPOFOL N/A 03/01/2020   Procedure: COLONOSCOPY WITH PROPOFOL;  Surgeon: Harvel Quale, MD;  [facility];  Service: Gastroenterology;  Laterality: N/A;  730   CORONARY ANGIOPLASTY WITH STENT PLACEMENT  2011   CORONARY ARTERY BYPASS  GRAFT N/A 03/10/2019   Procedure: CORONARY ARTERY BYPASS GRAFTING (CABG)X 3 Using Left Internal Mammary Artery and Right Saphenous Vein Grafts;  Surgeon: Ivin Poot, MD;  Location: [place];  Service: Open Heart Surgery;  Laterality: N/A;   ESOPHAGOGASTRODUODENOSCOPY (EGD) WITH PROPOFOL N/A 03/01/2020   Procedure: ESOPHAGOGASTRODUODENOSCOPY (EGD) WITH PROPOFOL;  Surgeon: Harvel Quale, MD;  [facility];  Service: Gastroenterology;  Laterality: N/A;   LEFT HEART CATH AND CORONARY ANGIOGRAPHY N/A 03/08/2019   Procedure: LEFT HEART CATH AND CORONARY ANGIOGRAPHY;  Surgeon: Belva Crome, MD;  Location: [place] [facility];  Service: Cardiovascular;  Laterality: N/A;   POLYPECTOMY  03/01/2020   Procedure: POLYPECTOMY;  Surgeon: Harvel Quale, MD;  [facility];  Service: Gastroenterology;;   TEE WITHOUT CARDIOVERSION N/A 03/10/2019   Procedure: TRANSESOPHAGEAL ECHOCARDIOGRAM (TEE);  Surgeon: Prescott Gum, Collier Salina, MD;  Location: [place];  Service: Open Heart Surgery;  Laterality: N/A;   TONSILLECTOMY     Patient Active Problem List   Diagnosis Date Noted   Pleural effusion on right 01/26/2022   Gout 05/22/2021   Positive fecal occult blood test 01/25/2020   Anemia 01/25/2020   Left leg weakness 04/26/2019   Generalized anxiety disorder    Peripheral edema  Morbid obesity ([place])    Acute gout of right ankle    Hypoalbuminemia due to protein-calorie malnutrition (HCC)    Acute blood loss anemia    Chronic diastolic congestive heart failure ([place])    Debility 03/27/2019   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 ([place]) 03/27/2019   Lethargy    Persistent atrial fibrillation ([place])    Cerebral embolism with cerebral infarction 03/13/2019   Hx of CABG 03/10/2019   NSTEMI (non-ST elevated myocardial infarction) ([place]) 03/07/2019   Unstable angina ([place]) 03/07/2019   Anxiety 04/23/2018   Difficulty sleeping 01/06/2018   Seasonal  allergies 01/04/2018   OSA on CPAP 05/27/2016   Obesity 06/10/2011   Insomnia 06/10/2011   PVC's (premature ventricular contractions)    CAD (coronary artery disease)    Carotid artery disease ([place])    DEGENERATIVE JOINT DISEASE 07/28/2010   Dyslipidemia 06/17/2010   SUBSTANCE ABUSE, MULTIPLE 06/17/2010   Essential hypertension 06/17/2010   CAD 06/17/2010   BRADYCARDIA 06/17/2010    PCP: Allyn Kenner  REFERRING PROVIDER: Allyn Kenner  REFERRING DIAG:  PT eval/tx for R29.6 repeated falls per Allyn Kenner, MD  THERAPY DIAG:  History of multiple falls  Rationale for Evaluation and Treatment Rehabilitation  ONSET DATE: 07/09/2021  SUBJECTIVE:   SUBJECTIVE STATEMENT:  Wearing regular shows today. His toe is better. He is doing exercises at home without issue, other than ones on bed are tough.   PERTINENT HISTORY: CVA, PVC, CABG, poly substance abuse  PAIN:  Are you having pain? Yes: NPRS scale: 5/10 Pain location: low back  Pain description: sharp, constant  Aggravating factors: sitting and getting up  Relieving factors: Meds    PRECAUTIONS: None  WEIGHT BEARING RESTRICTIONS No  FALLS:  Has patient fallen in last 6 months? Yes. Number of falls 2  LIVING ENVIRONMENT: Lives with: lives alone Lives in: House/apartment Stairs: Yes: Internal: 2 steps; on right going up Has following equipment at home: None  OCCUPATION: retired   PLOF: [place] not to fall,   OBJECTIVE:   DIAGNOSTIC FINDINGS:  IMPRESSION: 1. Unchanged appearance of subacute right ACA as well as ACA/MCA watershed territory infarcts. No new acute infarction is demonstrated. No intracranial hemorrhage. 2. Stable atrophy and chronic small vessel ischemic disease.     Electronically Signed   By: Kellie Simmering   On: 03/22/2019 17:13      COGNITION:  Overall cognitive status: Within functional limits for tasks assessed       POSTURE: rounded shoulders, forward head, decreased  lumbar lordosis, increased thoracic kyphosis, and flexed trunk    LOWER EXTREMITY MMT:  MMT Right eval Left eval  Hip flexion 5/5 4+/5  Hip extension 3/5 3-/5  Hip abduction 5/5 3/5  Hip adduction    Hip internal rotation    Hip external rotation    Knee flexion 5/5 4+  Knee extension 5/5 5/5  Ankle dorsiflexion 4/5 5/5  Ankle plantarflexion    Ankle inversion    Ankle eversion     (Blank rows = not tested)    FUNCTIONAL TESTS:  30 seconds chair stand test: 10 2 minute walk test: 283 feet no AD (shuffling gait, decreased ground clearance) Single leg stance:  Rt:  2" , Lt:  deferred due to walking shoe     TODAY'S TREATMENT:                       02/19/22 2 MWT 283  feet no AD (shuffling gait, decreased ground clearance)          Standing                        Heel raises 2x 10  Toe raise 2 x10                        Functional squat 2 x 15                        Tandem stance 3 x 30" each  4 inch step up 2 x 10 each HHA x 2                          Standing hip abduction 2 x 10 each     Standing hip extension 2 x 10 each     02/17/2022:                        Standing:                        Heel raises x 10                        Functional squat x 10                        Tandem stance x 3 each                        Single leg stance x 3                        Side stepping x 2 rt at //                        Sitting:                        Sit to stand x 10                        Scapular retraction x 10                        Supine:                       Bridge x 10                        Active hamstring stretch 30" x 3                        Side lying:  Lt hip abduction x 15              PATIENT EDUCATION:  Education details: HEP Person educated: Patient Education method: Explanation Education comprehension: returned demonstration   HOME EXERCISE PROGRAM:  [place]  02/19/22 - Heel Raises with Counter Support  - 2 x daily - 7 x weekly - 2  sets - 10 reps - Toe Raises with Counter Support  - 2 x daily - 7 x weekly - 2 sets - 10 reps -  Sit to Stand with Counter Support  - 2 x daily - 7 x weekly - 2 sets - 10 reps - Standing Hip Abduction with Counter Support  - 2 x daily - 7 x weekly - 2 sets - 10 reps - Standing Hip Extension with Counter Support  - 2 x daily - 7 x weekly - 2 sets - 10 reps  02/18/2023: [id number]:              Sitting scapular retraction x 10              Supine active hamstring stretch 30" x 3  Evaluation:  Side lying hip abduction :  5x Supine:  bridge 2 sets of 10  Sit to stand x 10   ASSESSMENT:  CLINICAL IMPRESSION:   Tolerated session well. Continues to be limited by fatigue and standing balance. Noted gait deviations with 2 MWT performed today. Progressed standing LE strength and balance activity to address current deficits. Patient educated on slowing exercise down for improved muscle activation. Updated HEP and issued handout. Patient will continue to benefit from skilled therapy services to reduce remaining deficits and improve functional ability.    OBJECTIVE IMPAIRMENTS decreased activity tolerance, decreased balance, difficulty walking, and decreased strength.   ACTIVITY LIMITATIONS carrying, lifting, and locomotion level  PARTICIPATION LIMITATIONS: community activity  PERSONAL FACTORS Age, Time since onset of injury/illness/exacerbation, and 3+ comorbidities: CVA, brady cardio, substance abuse, CABG  are also affecting patient's functional outcome.   REHAB POTENTIAL: Good  CLINICAL DECISION MAKING: Evolving/moderate complexity  EVALUATION COMPLEXITY: Moderate   GOALS: Goals reviewed with patient? No  SHORT TERM GOALS: Target date: 02/26/2022  PT to be I in HEP to decrease risk of falling  Baseline: Goal status: IN PROGRESS  2.  Pt mm strength to improve 1/2 grade to allow pt to come sit to stand 12  in 30 seconds to demonstrate improved power.   Baseline:  Goal status: IN  PROGRESS  3.  Pt to be able to single leg stance on both LE for 10 seconds to reduce risk of falling  Baseline:  Goal status: IN PROGRESS   LONG TERM GOALS: Target date: 03/12/2022   PT to be I in advanced HEP to improve mm strength by one grade to allow improved mobility Baseline:  Goal status: IN PROGRESS  2.  Pt to be able to go up and down 8 steps with one handrail assist  Baseline:  Goal status: IN PROGRESS  3.  Pt to be able to single leg stance on both LE for 20 " to reduce risk of falling.  Baseline:  Goal status: IN PROGRESS     PLAN: PT FREQUENCY: 2x/week  PT DURATION: 6 weeks  PLANNED INTERVENTIONS: Therapeutic exercises, Therapeutic activity, Neuromuscular re-education, Balance training, Gait training, Patient/Family education, Self Care, and Manual therapy  PLAN FOR NEXT SESSION:  Progress strengthening and balance.  2:35 PM, 02/19/22 Josue Hector PT DPT  Physical Therapist with [facility]  [phone]

## 2022-02-20 DIAGNOSIS — M10061 Idiopathic gout, right knee: Secondary | ICD-10-CM | POA: Diagnosis not present

## 2022-02-20 DIAGNOSIS — Z23 Encounter for immunization: Secondary | ICD-10-CM | POA: Diagnosis not present

## 2022-02-24 ENCOUNTER — Ambulatory Visit (HOSPITAL_COMMUNITY): Payer: Medicare Other | Admitting: Physical Therapy

## 2022-02-24 DIAGNOSIS — R296 Repeated falls: Secondary | ICD-10-CM | POA: Diagnosis not present

## 2022-02-24 DIAGNOSIS — M6281 Muscle weakness (generalized): Secondary | ICD-10-CM

## 2022-02-24 DIAGNOSIS — R262 Difficulty in walking, not elsewhere classified: Secondary | ICD-10-CM | POA: Diagnosis not present

## 2022-02-24 NOTE — Therapy (Signed)
OUTPATIENT PHYSICAL THERAPY LOWER EXTREMITY Treatment    Patient Name: Martin [name] MRN: [id number] DOB:09/08/1955, 66 y.o., male Today's Date: 02/24/2022  END OF SESSION:   PT End of Session - 02/24/22 1346     Visit Number 4    Number of Visits 12    Date for PT Re-Evaluation 03/12/22   pt will not start for 2 weeks,(until he is able to take his walking shoe off)   Authorization Type [employer] medicare    Progress Note Due on Visit 10    PT Start Time 1349    PT Stop Time 1430    PT Time Calculation (min) 41 min    Activity Tolerance Patient tolerated treatment well    Behavior During Therapy [facility] for tasks assessed/performed              Past Medical History:  Diagnosis Date   (HFpEF) heart failure with preserved ejection fraction (HCC)    CAD (coronary artery disease)    a. s/p PCI to LAD 2001. b. s/p stenting to the RCA and mid LAD December 2011, residual distal RCA disease treated medically. c.  Multivessel disease status post CABG October 2020   Carotid artery disease ([place])    Essential hypertension    GERD (gastroesophageal reflux disease)    Habitual alcohol use    Hyperlipidemia    Low back pain    Myocardial infarction ([place]) 2020   OSA on CPAP    Osteoarthritis    Permanent atrial fibrillation ([place])    a. dx 10/2017   Polysubstance abuse ([place])    History of marijuana and Vicodin abuse   PVC's (premature ventricular contractions)    Seasonal allergies    Stroke [facility]) 2020   Past Surgical History:  Procedure Laterality Date   CARDIOVERSION N/A 11/19/2017   Procedure: CARDIOVERSION;  Surgeon: Herminio Commons, MD;  [facility];  Service: Cardiovascular;  Laterality: N/A;   COLONOSCOPY WITH PROPOFOL N/A 03/01/2020   Procedure: COLONOSCOPY WITH PROPOFOL;  Surgeon: Harvel Quale, MD;  [facility];  Service: Gastroenterology;  Laterality: N/A;  730   CORONARY ANGIOPLASTY WITH STENT PLACEMENT  2011   CORONARY ARTERY BYPASS  GRAFT N/A 03/10/2019   Procedure: CORONARY ARTERY BYPASS GRAFTING (CABG)X 3 Using Left Internal Mammary Artery and Right Saphenous Vein Grafts;  Surgeon: Ivin Poot, MD;  Location: [place];  Service: Open Heart Surgery;  Laterality: N/A;   ESOPHAGOGASTRODUODENOSCOPY (EGD) WITH PROPOFOL N/A 03/01/2020   Procedure: ESOPHAGOGASTRODUODENOSCOPY (EGD) WITH PROPOFOL;  Surgeon: Harvel Quale, MD;  [facility];  Service: Gastroenterology;  Laterality: N/A;   LEFT HEART CATH AND CORONARY ANGIOGRAPHY N/A 03/08/2019   Procedure: LEFT HEART CATH AND CORONARY ANGIOGRAPHY;  Surgeon: Belva Crome, MD;  Location: [place] [facility];  Service: Cardiovascular;  Laterality: N/A;   POLYPECTOMY  03/01/2020   Procedure: POLYPECTOMY;  Surgeon: Harvel Quale, MD;  [facility];  Service: Gastroenterology;;   TEE WITHOUT CARDIOVERSION N/A 03/10/2019   Procedure: TRANSESOPHAGEAL ECHOCARDIOGRAM (TEE);  Surgeon: Prescott Gum, Collier Salina, MD;  Location: [place];  Service: Open Heart Surgery;  Laterality: N/A;   TONSILLECTOMY     Patient Active Problem List   Diagnosis Date Noted   Pleural effusion on right 01/26/2022   Gout 05/22/2021   Positive fecal occult blood test 01/25/2020   Anemia 01/25/2020   Left leg weakness 04/26/2019   Generalized anxiety disorder    Peripheral edema  Morbid obesity ([place])    Acute gout of right ankle    Hypoalbuminemia due to protein-calorie malnutrition (HCC)    Acute blood loss anemia    Chronic diastolic congestive heart failure ([place])    Debility 03/27/2019   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 ([place]) 03/27/2019   Lethargy    Persistent atrial fibrillation ([place])    Cerebral embolism with cerebral infarction 03/13/2019   Hx of CABG 03/10/2019   NSTEMI (non-ST elevated myocardial infarction) ([place]) 03/07/2019   Unstable angina ([place]) 03/07/2019   Anxiety 04/23/2018   Difficulty sleeping 01/06/2018   Seasonal  allergies 01/04/2018   OSA on CPAP 05/27/2016   Obesity 06/10/2011   Insomnia 06/10/2011   PVC's (premature ventricular contractions)    CAD (coronary artery disease)    Carotid artery disease ([place])    DEGENERATIVE JOINT DISEASE 07/28/2010   Dyslipidemia 06/17/2010   SUBSTANCE ABUSE, MULTIPLE 06/17/2010   Essential hypertension 06/17/2010   CAD 06/17/2010   BRADYCARDIA 06/17/2010    PCP: Allyn Kenner  REFERRING PROVIDER: Allyn Kenner  REFERRING DIAG:  PT eval/tx for R29.6 repeated falls per Allyn Kenner, MD  THERAPY DIAG:  History of multiple falls  Rationale for Evaluation and Treatment Rehabilitation  ONSET DATE: 07/09/2021  SUBJECTIVE:   SUBJECTIVE STATEMENT:  Having some issues with gout and knee pain lately. Called MD and was put on Prednisone. Feeling pretty good now.   PERTINENT HISTORY: CVA, PVC, CABG, poly substance abuse  PAIN:  Are you having pain? Yes: NPRS scale: 0/10 Pain location: low back  Pain description: sharp, constant  Aggravating factors: sitting and getting up  Relieving factors: Meds    PRECAUTIONS: None  WEIGHT BEARING RESTRICTIONS No  FALLS:  Has patient fallen in last 6 months? Yes. Number of falls 2  LIVING ENVIRONMENT: Lives with: lives alone Lives in: House/apartment Stairs: Yes: Internal: 2 steps; on right going up Has following equipment at home: None  OCCUPATION: retired   PLOF: [place] not to fall,   OBJECTIVE:   DIAGNOSTIC FINDINGS:  IMPRESSION: 1. Unchanged appearance of subacute right ACA as well as ACA/MCA watershed territory infarcts. No new acute infarction is demonstrated. No intracranial hemorrhage. 2. Stable atrophy and chronic small vessel ischemic disease.     Electronically Signed   By: Kellie Simmering   On: 03/22/2019 17:13      COGNITION:  Overall cognitive status: Within functional limits for tasks assessed       POSTURE: rounded shoulders, forward head, decreased lumbar  lordosis, increased thoracic kyphosis, and flexed trunk    LOWER EXTREMITY MMT:  MMT Right eval Left eval  Hip flexion 5/5 4+/5  Hip extension 3/5 3-/5  Hip abduction 5/5 3/5  Hip adduction    Hip internal rotation    Hip external rotation    Knee flexion 5/5 4+  Knee extension 5/5 5/5  Ankle dorsiflexion 4/5 5/5  Ankle plantarflexion    Ankle inversion    Ankle eversion     (Blank rows = not tested)    FUNCTIONAL TESTS:  30 seconds chair stand test: 10 2 minute walk test: 283 feet no AD (shuffling gait, decreased ground clearance) Single leg stance:  Rt:  2" , Lt:  deferred due to walking shoe     TODAY'S TREATMENT:                      02/24/22 Nustep 5 min dynamic warmup (seat 9)  lv 4     Heel raises 2x 10  Toe raise 2 x10                         Functional squat 2 x 15                        Tandem stance 3 x 30" each   Stairs 4 inch 5 RT single rail   Standing march 3lb x25 each                        Standing hip abduction 3lb x 25 each    Standing hip extension 3lb x 25 each        02/19/22 2 MWT 283 feet no AD (shuffling gait, decreased ground clearance)          Standing                        Heel raises 2x 10  Toe raise 2 x10                        Functional squat 2 x 15                        Tandem stance 3 x 30" each  4 inch step up 2 x 10 each HHA x 2                          Standing hip abduction 2 x 10 each     Standing hip extension 2 x 10 each     02/17/2022:                        Standing:                        Heel raises x 10                        Functional squat x 10                        Tandem stance x 3 each                        Single leg stance x 3                        Side stepping x 2 rt at //                        Sitting:                        Sit to stand x 10                        Scapular retraction x 10                        Supine:                       Bridge x 10  Active  hamstring stretch 30" x 3                        Side lying:  Lt hip abduction x 15              PATIENT EDUCATION:  Education details: HEP Person educated: Patient Education method: Explanation Education comprehension: returned demonstration   HOME EXERCISE PROGRAM:  [place]  02/19/22 - Heel Raises with Counter Support  - 2 x daily - 7 x weekly - 2 sets - 10 reps - Toe Raises with Counter Support  - 2 x daily - 7 x weekly - 2 sets - 10 reps - Sit to Stand with Counter Support  - 2 x daily - 7 x weekly - 2 sets - 10 reps - Standing Hip Abduction with Counter Support  - 2 x daily - 7 x weekly - 2 sets - 10 reps - Standing Hip Extension with Counter Support  - 2 x daily - 7 x weekly - 2 sets - 10 reps  02/18/2023: [id number]:              Sitting scapular retraction x 10              Supine active hamstring stretch 30" x 3  Evaluation:  Side lying hip abduction :  5x Supine:  bridge 2 sets of 10  Sit to stand x 10   ASSESSMENT:  CLINICAL IMPRESSION:   Patient demos improved tolerance today. Increased reps with standing exercise and added 3 lb ankle weights. Patient does demo fatigue and requires intermittent rest breaks. Added stair ambulation with good return. Patient cued on form with standing hip extension for improved glute activation. Patient continues to be limited by functional weakness and decreased activity tolerance. Patient will continue to benefit from skilled therapy services to reduce remaining deficits and improve functional ability.     OBJECTIVE IMPAIRMENTS decreased activity tolerance, decreased balance, difficulty walking, and decreased strength.   ACTIVITY LIMITATIONS carrying, lifting, and locomotion level  PARTICIPATION LIMITATIONS: community activity  PERSONAL FACTORS Age, Time since onset of injury/illness/exacerbation, and 3+ comorbidities: CVA, brady cardio, substance abuse, CABG  are also affecting patient's functional outcome.   REHAB POTENTIAL:  Good  CLINICAL DECISION MAKING: Evolving/moderate complexity  EVALUATION COMPLEXITY: Moderate   GOALS: Goals reviewed with patient? No  SHORT TERM GOALS: Target date: 02/26/2022  PT to be I in HEP to decrease risk of falling  Baseline: Goal status: IN PROGRESS  2.  Pt mm strength to improve 1/2 grade to allow pt to come sit to stand 12  in 30 seconds to demonstrate improved power.   Baseline:  Goal status: IN PROGRESS  3.  Pt to be able to single leg stance on both LE for 10 seconds to reduce risk of falling  Baseline:  Goal status: IN PROGRESS   LONG TERM GOALS: Target date: 03/12/2022   PT to be I in advanced HEP to improve mm strength by one grade to allow improved mobility Baseline:  Goal status: IN PROGRESS  2.  Pt to be able to go up and down 8 steps with one handrail assist  Baseline:  Goal status: IN PROGRESS  3.  Pt to be able to single leg stance on both LE for 20 " to reduce risk of falling.  Baseline:  Goal status: IN PROGRESS     PLAN: PT FREQUENCY: 2x/week  PT DURATION: 6 weeks  PLANNED INTERVENTIONS:  Therapeutic exercises, Therapeutic activity, Neuromuscular re-education, Balance training, Gait training, Patient/Family education, Self Care, and Manual therapy  PLAN FOR NEXT SESSION:  Progress strengthening and balance.  1:46 PM, 02/24/22 Josue Hector PT DPT  Physical Therapist with [facility]  [phone]

## 2022-02-26 ENCOUNTER — Ambulatory Visit (HOSPITAL_COMMUNITY): Payer: Medicare Other | Admitting: Physical Therapy

## 2022-02-26 ENCOUNTER — Encounter (HOSPITAL_COMMUNITY): Payer: Self-pay | Admitting: Physical Therapy

## 2022-02-26 DIAGNOSIS — M722 Plantar fascial fibromatosis: Secondary | ICD-10-CM | POA: Diagnosis not present

## 2022-02-26 DIAGNOSIS — M7731 Calcaneal spur, right foot: Secondary | ICD-10-CM | POA: Diagnosis not present

## 2022-02-26 DIAGNOSIS — M79672 Pain in left foot: Secondary | ICD-10-CM | POA: Diagnosis not present

## 2022-02-26 DIAGNOSIS — M79671 Pain in right foot: Secondary | ICD-10-CM | POA: Diagnosis not present

## 2022-02-26 DIAGNOSIS — M6281 Muscle weakness (generalized): Secondary | ICD-10-CM | POA: Diagnosis not present

## 2022-02-26 DIAGNOSIS — R296 Repeated falls: Secondary | ICD-10-CM | POA: Diagnosis not present

## 2022-02-26 DIAGNOSIS — M25579 Pain in unspecified ankle and joints of unspecified foot: Secondary | ICD-10-CM | POA: Diagnosis not present

## 2022-02-26 DIAGNOSIS — R262 Difficulty in walking, not elsewhere classified: Secondary | ICD-10-CM | POA: Diagnosis not present

## 2022-02-26 NOTE — Therapy (Signed)
OUTPATIENT PHYSICAL THERAPY LOWER EXTREMITY Treatment    Patient Name: Martin [name] MRN: [id number] DOB:09-Nov-1955, 66 y.o., male Today's Date: 02/26/2022  END OF SESSION:   PT End of Session - 02/26/22 1559     Visit Number 5    Number of Visits 12    Date for PT Re-Evaluation 03/12/22   pt will not start for 2 weeks,(until he is able to take his walking shoe off)   Authorization Type [employer] medicare    Progress Note Due on Visit 10    PT Start Time 1520    PT Stop Time 1559    PT Time Calculation (min) 39 min    Activity Tolerance Patient tolerated treatment well    Behavior During Therapy [facility] for tasks assessed/performed               Past Medical History:  Diagnosis Date   (HFpEF) heart failure with preserved ejection fraction ([place])    CAD (coronary artery disease)    a. s/p PCI to LAD 2001. b. s/p stenting to the RCA and mid LAD December 2011, residual distal RCA disease treated medically. c.  Multivessel disease status post CABG October 2020   Carotid artery disease ([place])    Essential hypertension    GERD (gastroesophageal reflux disease)    Habitual alcohol use    Hyperlipidemia    Low back pain    Myocardial infarction ([place]) 2020   OSA on CPAP    Osteoarthritis    Permanent atrial fibrillation ([place])    a. dx 10/2017   Polysubstance abuse ([place])    History of marijuana and Vicodin abuse   PVC's (premature ventricular contractions)    Seasonal allergies    Stroke [facility]) 2020   Past Surgical History:  Procedure Laterality Date   CARDIOVERSION N/A 11/19/2017   Procedure: CARDIOVERSION;  Surgeon: Herminio Commons, MD;  [facility];  Service: Cardiovascular;  Laterality: N/A;   COLONOSCOPY WITH PROPOFOL N/A 03/01/2020   Procedure: COLONOSCOPY WITH PROPOFOL;  Surgeon: Harvel Quale, MD;  [facility];  Service: Gastroenterology;  Laterality: N/A;  730   CORONARY ANGIOPLASTY WITH STENT PLACEMENT  2011   CORONARY ARTERY  BYPASS GRAFT N/A 03/10/2019   Procedure: CORONARY ARTERY BYPASS GRAFTING (CABG)X 3 Using Left Internal Mammary Artery and Right Saphenous Vein Grafts;  Surgeon: Ivin Poot, MD;  Location: [place];  Service: Open Heart Surgery;  Laterality: N/A;   ESOPHAGOGASTRODUODENOSCOPY (EGD) WITH PROPOFOL N/A 03/01/2020   Procedure: ESOPHAGOGASTRODUODENOSCOPY (EGD) WITH PROPOFOL;  Surgeon: Harvel Quale, MD;  [facility];  Service: Gastroenterology;  Laterality: N/A;   LEFT HEART CATH AND CORONARY ANGIOGRAPHY N/A 03/08/2019   Procedure: LEFT HEART CATH AND CORONARY ANGIOGRAPHY;  Surgeon: Belva Crome, MD;  Location: [place] CV [facility];  Service: Cardiovascular;  Laterality: N/A;   POLYPECTOMY  03/01/2020   Procedure: POLYPECTOMY;  Surgeon: Harvel Quale, MD;  [facility];  Service: Gastroenterology;;   TEE WITHOUT CARDIOVERSION N/A 03/10/2019   Procedure: TRANSESOPHAGEAL ECHOCARDIOGRAM (TEE);  Surgeon: Prescott Gum, Collier Salina, MD;  Location: [place];  Service: Open Heart Surgery;  Laterality: N/A;   TONSILLECTOMY     Patient Active Problem List   Diagnosis Date Noted   Pleural effusion on right 01/26/2022   Gout 05/22/2021   Positive fecal occult blood test 01/25/2020   Anemia 01/25/2020   Left leg weakness 04/26/2019   Generalized anxiety disorder    Peripheral edema  Morbid obesity ([place])    Acute gout of right ankle    Hypoalbuminemia due to protein-calorie malnutrition (HCC)    Acute blood loss anemia    Chronic diastolic congestive heart failure ([place])    Debility 03/27/2019   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 ([place]) 03/27/2019   Lethargy    Persistent atrial fibrillation ([place])    Cerebral embolism with cerebral infarction 03/13/2019   Hx of CABG 03/10/2019   NSTEMI (non-ST elevated myocardial infarction) ([place]) 03/07/2019   Unstable angina ([place]) 03/07/2019   Anxiety 04/23/2018   Difficulty sleeping 01/06/2018    Seasonal allergies 01/04/2018   OSA on CPAP 05/27/2016   Obesity 06/10/2011   Insomnia 06/10/2011   PVC's (premature ventricular contractions)    CAD (coronary artery disease)    Carotid artery disease ([place])    DEGENERATIVE JOINT DISEASE 07/28/2010   Dyslipidemia 06/17/2010   SUBSTANCE ABUSE, MULTIPLE 06/17/2010   Essential hypertension 06/17/2010   CAD 06/17/2010   BRADYCARDIA 06/17/2010    PCP: Allyn Kenner  REFERRING PROVIDER: Allyn Kenner  REFERRING DIAG:  PT eval/tx for R29.6 repeated falls per Allyn Kenner, MD  THERAPY DIAG:  History of multiple falls  Rationale for Evaluation and Treatment Rehabilitation  ONSET DATE: 07/09/2021  SUBJECTIVE:   SUBJECTIVE STATEMENT:  Pt has been doing his exercises at home.  He has not had any falls  PERTINENT HISTORY: CVA, PVC, CABG, poly substance abuse  PAIN:  Are you having pain? Yes: NPRS scale: 0/10 Pain location: low back  Pain description: sharp, constant  Aggravating factors: sitting and getting up  Relieving factors: Meds    PRECAUTIONS: None  WEIGHT BEARING RESTRICTIONS No  FALLS:  Has patient fallen in last 6 months? Yes. Number of falls 2  LIVING ENVIRONMENT: Lives with: lives alone Lives in: House/apartment Stairs: Yes: Internal: 2 steps; on right going up Has following equipment at home: None  OCCUPATION: retired   PLOF: [place] not to fall,   OBJECTIVE:   DIAGNOSTIC FINDINGS:  IMPRESSION: 1. Unchanged appearance of subacute right ACA as well as ACA/MCA watershed territory infarcts. No new acute infarction is demonstrated. No intracranial hemorrhage. 2. Stable atrophy and chronic small vessel ischemic disease.     Electronically Signed   By: Kellie Simmering   On: 03/22/2019 17:13      COGNITION:  Overall cognitive status: Within functional limits for tasks assessed       POSTURE: rounded shoulders, forward head, decreased lumbar lordosis, increased thoracic kyphosis, and  flexed trunk    LOWER EXTREMITY MMT:  MMT Right eval Left eval  Hip flexion 5/5 4+/5  Hip extension 3/5 3-/5  Hip abduction 5/5 3/5  Hip adduction    Hip internal rotation    Hip external rotation    Knee flexion 5/5 4+  Knee extension 5/5 5/5  Ankle dorsiflexion 4/5 5/5  Ankle plantarflexion    Ankle inversion    Ankle eversion     (Blank rows = not tested)    FUNCTIONAL TESTS:  30 seconds chair stand test: 10 2 minute walk test: 283 feet no AD (shuffling gait, decreased ground clearance) Single leg stance:  Rt:  2" , Lt:  deferred due to walking shoe     TODAY'S TREATMENT:            02/26/22           Standing:            Wall  arch x 10            Back against wall B shoulder flex without arching x 10            Rocker board x 2'            Tandem stance on foam x 3             Marching x 10 with one hand hold            PWR: up, rock, twist  and Step            Cone rotation x 2 Reps             Sit to stand x 15             Walk to the back hall in as few as steps as possible goal of 22                                    02/24/22 Nustep 5 min dynamic warmup (seat 9) lv 4     Heel raises 2x 10  Toe raise 2 x10                         Functional squat 2 x 15                        Tandem stance 3 x 30" each   Stairs 4 inch 5 RT single rail   Standing march 3lb x25 each                        Standing hip abduction 3lb x 25 each    Standing hip extension 3lb x 25 each        02/19/22 2 MWT 283 feet no AD (shuffling gait, decreased ground clearance)          Standing                        Heel raises 2x 10  Toe raise 2 x10                        Functional squat 2 x 15                        Tandem stance 3 x 30" each  4 inch step up 2 x 10 each HHA x 2                          Standing hip abduction 2 x 10 each     Standing hip extension 2 x 10 each     02/17/2022:                        Standing:                        Heel raises x 10                         Functional squat x 10  Tandem stance x 3 each                        Single leg stance x 3                        Side stepping x 2 rt at //                        Sitting:                        Sit to stand x 10                        Scapular retraction x 10                        Supine:                       Bridge x 10                        Active hamstring stretch 30" x 3                        Side lying:  Lt hip abduction x 15              PATIENT EDUCATION:  Education details: HEP Person educated: Patient Education method: Explanation Education comprehension: returned demonstration   HOME EXERCISE PROGRAM:  [place]  02/19/22 - Heel Raises with Counter Support  - 2 x daily - 7 x weekly - 2 sets - 10 reps - Toe Raises with Counter Support  - 2 x daily - 7 x weekly - 2 sets - 10 reps - Sit to Stand with Counter Support  - 2 x daily - 7 x weekly - 2 sets - 10 reps - Standing Hip Abduction with Counter Support  - 2 x daily - 7 x weekly - 2 sets - 10 reps - Standing Hip Extension with Counter Support  - 2 x daily - 7 x weekly - 2 sets - 10 reps  02/18/2023: [id number]:              Sitting scapular retraction x 10              Supine active hamstring stretch 30" x 3  Evaluation:  Side lying hip abduction  ASSESSMENT:  CLINICAL IMPRESSION:   Today's treatment focused on shifting weight and improved stride length when walking, both of which was challenging to pt . Patient will continue to benefit from skilled therapy services to reduce remaining deficits and improve functional ability.     OBJECTIVE IMPAIRMENTS decreased activity tolerance, decreased balance, difficulty walking, and decreased strength.   ACTIVITY LIMITATIONS carrying, lifting, and locomotion level  PARTICIPATION LIMITATIONS: community activity  PERSONAL FACTORS Age, Time since onset of injury/illness/exacerbation, and 3+ comorbidities: CVA, brady cardio, substance  abuse, CABG  are also affecting patient's functional outcome.   REHAB POTENTIAL: Good  CLINICAL DECISION MAKING: Evolving/moderate complexity  EVALUATION COMPLEXITY: Moderate   GOALS: Goals reviewed with patient? No  SHORT TERM GOALS: Target date: 02/26/2022  PT to be I in HEP to decrease risk of falling  Baseline: Goal status: IN PROGRESS  2.  Pt mm strength to  improve 1/2 grade to allow pt to come sit to stand 12  in 30 seconds to demonstrate improved power.   Baseline:  Goal status: IN PROGRESS  3.  Pt to be able to single leg stance on both LE for 10 seconds to reduce risk of falling  Baseline:  Goal status: IN PROGRESS   LONG TERM GOALS: Target date: 03/12/2022   PT to be I in advanced HEP to improve mm strength by one grade to allow improved mobility Baseline:  Goal status: IN PROGRESS  2.  Pt to be able to go up and down 8 steps with one handrail assist  Baseline:  Goal status: IN PROGRESS  3.  Pt to be able to single leg stance on both LE for 20 " to reduce risk of falling.  Baseline:  Goal status: IN PROGRESS     PLAN: PT FREQUENCY: 2x/week  PT DURATION: 6 weeks  PLANNED INTERVENTIONS: Therapeutic exercises, Therapeutic activity, Neuromuscular re-education, Balance training, Gait training, Patient/Family education, Self Care, and Manual therapy  PLAN FOR NEXT SESSION:  Progress strengthening and balance. Rayetta Humphrey, PT CLT [phone]  1600PM

## 2022-03-03 ENCOUNTER — Other Ambulatory Visit: Payer: Self-pay | Admitting: Cardiology

## 2022-03-03 ENCOUNTER — Encounter (HOSPITAL_COMMUNITY): Payer: Medicare Other

## 2022-03-05 ENCOUNTER — Ambulatory Visit (HOSPITAL_COMMUNITY): Payer: Medicare Other | Admitting: Physical Therapy

## 2022-03-05 DIAGNOSIS — R262 Difficulty in walking, not elsewhere classified: Secondary | ICD-10-CM | POA: Diagnosis not present

## 2022-03-05 DIAGNOSIS — M6281 Muscle weakness (generalized): Secondary | ICD-10-CM | POA: Diagnosis not present

## 2022-03-05 DIAGNOSIS — R296 Repeated falls: Secondary | ICD-10-CM

## 2022-03-05 NOTE — Therapy (Signed)
OUTPATIENT PHYSICAL THERAPY LOWER EXTREMITY Treatment    Patient Name: Martin [name] MRN: [id number] DOB:February 03, 1956, 66 y.o., male Today's Date: 03/05/2022  END OF SESSION:   PT End of Session - 03/05/22 1513     Visit Number 6    Number of Visits 12    Date for PT Re-Evaluation 03/12/22   pt will not start for 2 weeks,(until he is able to take his walking shoe off)   Authorization Type [employer] medicare    Progress Note Due on Visit 10    PT Start Time 1433    PT Stop Time 1513    PT Time Calculation (min) 40 min    Activity Tolerance Patient tolerated treatment well    Behavior During Therapy [facility] for tasks assessed/performed                Past Medical History:  Diagnosis Date   (HFpEF) heart failure with preserved ejection fraction (HCC)    CAD (coronary artery disease)    a. s/p PCI to LAD 2001. b. s/p stenting to the RCA and mid LAD December 2011, residual distal RCA disease treated medically. c.  Multivessel disease status post CABG October 2020   Carotid artery disease ([place])    Essential hypertension    GERD (gastroesophageal reflux disease)    Habitual alcohol use    Hyperlipidemia    Low back pain    Myocardial infarction ([place]) 2020   OSA on CPAP    Osteoarthritis    Permanent atrial fibrillation ([place])    a. dx 10/2017   Polysubstance abuse ([place])    History of marijuana and Vicodin abuse   PVC's (premature ventricular contractions)    Seasonal allergies    Stroke [facility]) 2020   Past Surgical History:  Procedure Laterality Date   CARDIOVERSION N/A 11/19/2017   Procedure: CARDIOVERSION;  Surgeon: Herminio Commons, MD;  [facility];  Service: Cardiovascular;  Laterality: N/A;   COLONOSCOPY WITH PROPOFOL N/A 03/01/2020   Procedure: COLONOSCOPY WITH PROPOFOL;  Surgeon: Harvel Quale, MD;  [facility];  Service: Gastroenterology;  Laterality: N/A;  730   CORONARY ANGIOPLASTY WITH STENT PLACEMENT  2011   CORONARY ARTERY  BYPASS GRAFT N/A 03/10/2019   Procedure: CORONARY ARTERY BYPASS GRAFTING (CABG)X 3 Using Left Internal Mammary Artery and Right Saphenous Vein Grafts;  Surgeon: Ivin Poot, MD;  Location: [place];  Service: Open Heart Surgery;  Laterality: N/A;   ESOPHAGOGASTRODUODENOSCOPY (EGD) WITH PROPOFOL N/A 03/01/2020   Procedure: ESOPHAGOGASTRODUODENOSCOPY (EGD) WITH PROPOFOL;  Surgeon: Harvel Quale, MD;  [facility];  Service: Gastroenterology;  Laterality: N/A;   LEFT HEART CATH AND CORONARY ANGIOGRAPHY N/A 03/08/2019   Procedure: LEFT HEART CATH AND CORONARY ANGIOGRAPHY;  Surgeon: Belva Crome, MD;  Location: [place] CV LAB;  Service: Cardiovascular;  Laterality: N/A;   POLYPECTOMY  03/01/2020   Procedure: POLYPECTOMY;  Surgeon: Harvel Quale, MD;  [facility];  Service: Gastroenterology;;   TEE WITHOUT CARDIOVERSION N/A 03/10/2019   Procedure: TRANSESOPHAGEAL ECHOCARDIOGRAM (TEE);  Surgeon: Prescott Gum, Collier Salina, MD;  Location: [place];  Service: Open Heart Surgery;  Laterality: N/A;   TONSILLECTOMY     Patient Active Problem List   Diagnosis Date Noted   Pleural effusion on right 01/26/2022   Gout 05/22/2021   Positive fecal occult blood test 01/25/2020   Anemia 01/25/2020   Left leg weakness 04/26/2019   Generalized anxiety disorder    Peripheral edema  Morbid obesity ([place])    Acute gout of right ankle    Hypoalbuminemia due to protein-calorie malnutrition (HCC)    Acute blood loss anemia    Chronic diastolic congestive heart failure ([place])    Debility 03/27/2019   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 ([place]) 03/27/2019   Lethargy    Persistent atrial fibrillation ([place])    Cerebral embolism with cerebral infarction 03/13/2019   Hx of CABG 03/10/2019   NSTEMI (non-ST elevated myocardial infarction) ([place]) 03/07/2019   Unstable angina ([place]) 03/07/2019   Anxiety 04/23/2018   Difficulty sleeping 01/06/2018    Seasonal allergies 01/04/2018   OSA on CPAP 05/27/2016   Obesity 06/10/2011   Insomnia 06/10/2011   PVC's (premature ventricular contractions)    CAD (coronary artery disease)    Carotid artery disease ([place])    DEGENERATIVE JOINT DISEASE 07/28/2010   Dyslipidemia 06/17/2010   SUBSTANCE ABUSE, MULTIPLE 06/17/2010   Essential hypertension 06/17/2010   CAD 06/17/2010   BRADYCARDIA 06/17/2010    PCP: Allyn Kenner  REFERRING PROVIDER: Allyn Kenner  REFERRING DIAG:  PT eval/tx for R29.6 repeated falls per Allyn Kenner, MD  THERAPY DIAG:  History of multiple falls  Rationale for Evaluation and Treatment Rehabilitation  ONSET DATE: 07/09/2021  SUBJECTIVE:   SUBJECTIVE STATEMENT:  PERTINENT HISTORY: CVA, PVC, CABG, poly substance abuse  PAIN:  Are you having pain? Yes: NPRS scale: 0/10 Pain location: low back  Pain description: sharp, constant  Aggravating factors: sitting and getting up  Relieving factors: Meds    PRECAUTIONS: None  WEIGHT BEARING RESTRICTIONS No  FALLS:  Has patient fallen in last 6 months? Yes. Number of falls 2  LIVING ENVIRONMENT: Lives with: lives alone Lives in: House/apartment Stairs: Yes: Internal: 2 steps; on right going up Has following equipment at home: None  OCCUPATION: retired   PLOF: [place] not to fall,   OBJECTIVE:   DIAGNOSTIC FINDINGS:  IMPRESSION: 1. Unchanged appearance of subacute right ACA as well as ACA/MCA watershed territory infarcts. No new acute infarction is demonstrated. No intracranial hemorrhage. 2. Stable atrophy and chronic small vessel ischemic disease.     Electronically Signed   By: Kellie Simmering   On: 03/22/2019 17:13      COGNITION:  Overall cognitive status: Within functional limits for tasks assessed       POSTURE: rounded shoulders, forward head, decreased lumbar lordosis, increased thoracic kyphosis, and flexed trunk    LOWER EXTREMITY MMT:  MMT Right eval Left eval   Hip flexion 5/5 4+/5  Hip extension 3/5 3-/5  Hip abduction 5/5 3/5  Hip adduction    Hip internal rotation    Hip external rotation    Knee flexion 5/5 4+  Knee extension 5/5 5/5  Ankle dorsiflexion 4/5 5/5  Ankle plantarflexion    Ankle inversion    Ankle eversion     (Blank rows = not tested)    FUNCTIONAL TESTS:  30 seconds chair stand test: 10 2 minute walk test: 283 feet no AD (shuffling gait, decreased ground clearance) Single leg stance:  Rt:  2" , Lt:  deferred due to walking shoe     TODAY'S TREATMENT:            03/05/22            Standing:            Forefoot raise x 10            Slant  board stretch 3 x 30"            Wall arch x 10            Back against wall B shoulder flex without arching x 10  w/2#                          Marching x 10 with one hand hold            PWR: up, rock, twist  and Step            [employer] hitting far away and up far away and down             Sit to stand x 15             Walk to the back hall in as few as steps as possible goal of 22              Side stepping with big arms x 2 RT                            02/26/22           Standing:            Wall arch x 10            Back against wall B shoulder flex without arching x 10            Rocker board x 2'            Tandem stance on foam x 3             Marching x 10 with one hand hold            PWR: up, rock, twist  and Step            Cone rotation x 2 Reps             Sit to stand x 15             Walk to the back hall in as few as steps as possible goal of 22                                    02/24/22 Nustep 5 min dynamic warmup (seat 9) lv 4     Heel raises 2x 10  Toe raise 2 x10                         Functional squat 2 x 15                        Tandem stance 3 x 30" each   Stairs 4 inch 5 RT single rail   Standing march 3lb x25 each                        Standing hip abduction 3lb x 25 each    Standing hip extension 3lb x 25 each         02/19/22 2 MWT 283 feet no AD (shuffling gait, decreased ground clearance)          Standing  Heel raises 2x 10  Toe raise 2 x10                        Functional squat 2 x 15                        Tandem stance 3 x 30" each  4 inch step up 2 x 10 each HHA x 2                          Standing hip abduction 2 x 10 each     Standing hip extension 2 x 10 each     02/17/2022:                        Standing:                        Heel raises x 10                        Functional squat x 10                        Tandem stance x 3 each                        Single leg stance x 3                        Side stepping x 2 rt at //                        Sitting:                        Sit to stand x 10                        Scapular retraction x 10                        Supine:                       Bridge x 10                        Active hamstring stretch 30" x 3                        Side lying:  Lt hip abduction x 15              PATIENT EDUCATION:  Education details: HEP Person educated: Patient Education method: Explanation Education comprehension: returned demonstration   HOME EXERCISE PROGRAM:  [place]  02/19/22 - Heel Raises with Counter Support  - 2 x daily - 7 x weekly - 2 sets - 10 reps - Toe Raises with Counter Support  - 2 x daily - 7 x weekly - 2 sets - 10 reps - Sit to Stand with Counter Support  - 2 x daily - 7 x weekly - 2 sets - 10 reps - Standing Hip Abduction with Counter Support  - 2 x daily - 7 x weekly - 2 sets -  10 reps - Standing Hip Extension with Counter Support  - 2 x daily - 7 x weekly - 2 sets - 10 reps  02/18/2023: [id number]:              Sitting scapular retraction x 10              Supine active hamstring stretch 30" x 3  Evaluation:  Side lying hip abduction  ASSESSMENT:  CLINICAL IMPRESSION:   Today's treatment continues to  focus on shifting weight and improved stride length when walking, both of which  continue to be challenging to pt .  Added side stepping to pt for both balance and strengthening . Patient will continue to benefit from skilled therapy services to reduce remaining deficits and improve functional ability.     OBJECTIVE IMPAIRMENTS decreased activity tolerance, decreased balance, difficulty walking, and decreased strength.   ACTIVITY LIMITATIONS carrying, lifting, and locomotion level  PARTICIPATION LIMITATIONS: community activity  PERSONAL FACTORS Age, Time since onset of injury/illness/exacerbation, and 3+ comorbidities: CVA, brady cardio, substance abuse, CABG  are also affecting patient's functional outcome.   REHAB POTENTIAL: Good  CLINICAL DECISION MAKING: Evolving/moderate complexity  EVALUATION COMPLEXITY: Moderate   GOALS: Goals reviewed with patient? No  SHORT TERM GOALS: Target date: 02/26/2022  PT to be I in HEP to decrease risk of falling  Baseline: Goal status: IN PROGRESS  2.  Pt mm strength to improve 1/2 grade to allow pt to come sit to stand 12  in 30 seconds to demonstrate improved power.   Baseline:  Goal status: IN PROGRESS  3.  Pt to be able to single leg stance on both LE for 10 seconds to reduce risk of falling  Baseline:  Goal status: IN PROGRESS   LONG TERM GOALS: Target date: 03/12/2022   PT to be I in advanced HEP to improve mm strength by one grade to allow improved mobility Baseline:  Goal status: IN PROGRESS  2.  Pt to be able to go up and down 8 steps with one handrail assist  Baseline:  Goal status: IN PROGRESS  3.  Pt to be able to single leg stance on both LE for 20 " to reduce risk of falling.  Baseline:  Goal status: IN PROGRESS     PLAN: PT FREQUENCY: 2x/week  PT DURATION: 6 weeks  PLANNED INTERVENTIONS: Therapeutic exercises, Therapeutic activity, Neuromuscular re-education, Balance training, Gait training, Patient/Family education, Self Care, and Manual therapy  PLAN FOR NEXT SESSION:  Progress  strengthening and balance. Rayetta Humphrey, [place] CLT [phone]  [phone] PM

## 2022-03-06 ENCOUNTER — Ambulatory Visit (HOSPITAL_COMMUNITY): Payer: Medicare Other

## 2022-03-06 DIAGNOSIS — R296 Repeated falls: Secondary | ICD-10-CM | POA: Diagnosis not present

## 2022-03-06 DIAGNOSIS — R262 Difficulty in walking, not elsewhere classified: Secondary | ICD-10-CM | POA: Diagnosis not present

## 2022-03-06 DIAGNOSIS — M6281 Muscle weakness (generalized): Secondary | ICD-10-CM | POA: Diagnosis not present

## 2022-03-06 NOTE — Therapy (Signed)
OUTPATIENT PHYSICAL THERAPY LOWER EXTREMITY Treatment    Patient Name: Martin [name] MRN: [id number] DOB:1956-04-08, 66 y.o., male Today's Date: 03/06/2022  END OF SESSION:   PT End of Session - 03/06/22 0813     Visit Number 7    Number of Visits 12    Date for PT Re-Evaluation 03/12/22    Authorization Type UHC medicare    PT Start Time 0810    PT Stop Time 0852    PT Time Calculation (min) 42 min                 Past Medical History:  Diagnosis Date   (HFpEF) heart failure with preserved ejection fraction (HCC)    CAD (coronary artery disease)    a. s/p PCI to LAD 2001. b. s/p stenting to the RCA and mid LAD December 2011, residual distal RCA disease treated medically. c.  Multivessel disease status post CABG October 2020   Carotid artery disease ([place])    Essential hypertension    GERD (gastroesophageal reflux disease)    Habitual alcohol use    Hyperlipidemia    Low back pain    Myocardial infarction ([place]) 2020   OSA on CPAP    Osteoarthritis    Permanent atrial fibrillation ([place])    a. dx 10/2017   Polysubstance abuse ([place])    History of marijuana and Vicodin abuse   PVC's (premature ventricular contractions)    Seasonal allergies    Stroke [facility]) 2020   Past Surgical History:  Procedure Laterality Date   CARDIOVERSION N/A 11/19/2017   Procedure: CARDIOVERSION;  Surgeon: Herminio Commons, MD;  [facility];  Service: Cardiovascular;  Laterality: N/A;   COLONOSCOPY WITH PROPOFOL N/A 03/01/2020   Procedure: COLONOSCOPY WITH PROPOFOL;  Surgeon: Harvel Quale, MD;  [facility];  Service: Gastroenterology;  Laterality: N/A;  730   CORONARY ANGIOPLASTY WITH STENT PLACEMENT  2011   CORONARY ARTERY BYPASS GRAFT N/A 03/10/2019   Procedure: CORONARY ARTERY BYPASS GRAFTING (CABG)X 3 Using Left Internal Mammary Artery and Right Saphenous Vein Grafts;  Surgeon: Ivin Poot, MD;  Location: [place];  Service: Open Heart Surgery;   Laterality: N/A;   ESOPHAGOGASTRODUODENOSCOPY (EGD) WITH PROPOFOL N/A 03/01/2020   Procedure: ESOPHAGOGASTRODUODENOSCOPY (EGD) WITH PROPOFOL;  Surgeon: Harvel Quale, MD;  [facility];  Service: Gastroenterology;  Laterality: N/A;   LEFT HEART CATH AND CORONARY ANGIOGRAPHY N/A 03/08/2019   Procedure: LEFT HEART CATH AND CORONARY ANGIOGRAPHY;  Surgeon: Belva Crome, MD;  Location: [place] CV LAB;  Service: Cardiovascular;  Laterality: N/A;   POLYPECTOMY  03/01/2020   Procedure: POLYPECTOMY;  Surgeon: Harvel Quale, MD;  [facility];  Service: Gastroenterology;;   TEE WITHOUT CARDIOVERSION N/A 03/10/2019   Procedure: TRANSESOPHAGEAL ECHOCARDIOGRAM (TEE);  Surgeon: Prescott Gum, Collier Salina, MD;  Location: [place];  Service: Open Heart Surgery;  Laterality: N/A;   TONSILLECTOMY     Patient Active Problem List   Diagnosis Date Noted   Pleural effusion on right 01/26/2022   Gout 05/22/2021   Positive fecal occult blood test 01/25/2020   Anemia 01/25/2020   Left leg weakness 04/26/2019   Generalized anxiety disorder    Peripheral edema    Morbid obesity ([place])    Acute gout of right ankle    Hypoalbuminemia due to protein-calorie malnutrition (HCC)    Acute blood loss anemia    Chronic diastolic congestive heart failure ([place])    Debility 03/27/2019  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 ([place]) 03/27/2019   Lethargy    Persistent atrial fibrillation (HCC)    Cerebral embolism with cerebral infarction 03/13/2019   Hx of CABG 03/10/2019   NSTEMI (non-ST elevated myocardial infarction) ([place]) 03/07/2019   Unstable angina ([place]) 03/07/2019   Anxiety 04/23/2018   Difficulty sleeping 01/06/2018   Seasonal allergies 01/04/2018   OSA on CPAP 05/27/2016   Obesity 06/10/2011   Insomnia 06/10/2011   PVC's (premature ventricular contractions)    CAD (coronary artery disease)    Carotid artery disease ([place])    DEGENERATIVE  JOINT DISEASE 07/28/2010   Dyslipidemia 06/17/2010   SUBSTANCE ABUSE, MULTIPLE 06/17/2010   Essential hypertension 06/17/2010   CAD 06/17/2010   BRADYCARDIA 06/17/2010    PCP: Allyn Kenner  REFERRING PROVIDER: Allyn Kenner  REFERRING DIAG:  PT eval/tx for R29.6 repeated falls per Allyn Kenner, MD  THERAPY DIAG:  History of multiple falls  Rationale for Evaluation and Treatment Rehabilitation  ONSET DATE: 07/09/2021  SUBJECTIVE:   SUBJECTIVE STATEMENT: Patient reports no pain today; does pretty well on steps has a handrail; no recent falls. Still attending maintanence cardiopulmonary therapy   PERTINENT HISTORY: CVA, PVC, CABG, poly substance abuse  PAIN:  Are you having pain? Yes: NPRS scale: 0/10 Pain location: low back  Pain description: sharp, constant  Aggravating factors: sitting and getting up  Relieving factors: Meds    PRECAUTIONS: None  WEIGHT BEARING RESTRICTIONS No  FALLS:  Has patient fallen in last 6 months? Yes. Number of falls 2  LIVING ENVIRONMENT: Lives with: lives alone Lives in: House/apartment Stairs: Yes: Internal: 2 steps; on right going up Has following equipment at home: None  OCCUPATION: retired   PLOF: [place] not to fall,   OBJECTIVE:   DIAGNOSTIC FINDINGS:  IMPRESSION: 1. Unchanged appearance of subacute right ACA as well as ACA/MCA watershed territory infarcts. No new acute infarction is demonstrated. No intracranial hemorrhage. 2. Stable atrophy and chronic small vessel ischemic disease.     Electronically Signed   By: Kellie Simmering   On: 03/22/2019 17:13      COGNITION:  Overall cognitive status: Within functional limits for tasks assessed       POSTURE: rounded shoulders, forward head, decreased lumbar lordosis, increased thoracic kyphosis, and flexed trunk    LOWER EXTREMITY MMT:  MMT Right eval Left eval  Hip flexion 5/5 4+/5  Hip extension 3/5 3-/5  Hip abduction 5/5 3/5  Hip adduction     Hip internal rotation    Hip external rotation    Knee flexion 5/5 4+  Knee extension 5/5 5/5  Ankle dorsiflexion 4/5 5/5  Ankle plantarflexion    Ankle inversion    Ankle eversion     (Blank rows = not tested)    FUNCTIONAL TESTS:  30 seconds chair stand test: 10 2 minute walk test: 283 feet no AD (shuffling gait, decreased ground clearance) Single leg stance:  Rt:  2" , Lt:  deferred due to walking shoe     TODAY'S TREATMENT: 03/06/22 Nustep 5 min dynamic warmup (seat 9) lv 4  Standing: Heel raises on incline 2 x 10 Toe raises 2 x 10 Slant board 5 x 20" Step length to target using cones on blue line x 4 down and back Step over cones blue line x 4 down and back Figure 8 walking down and back x 4  03/05/22            Standing:            Forefoot raise x 10            Slant board stretch 3 x 30"            Wall arch x 10            Back against wall B shoulder flex without arching x 10  w/2#                          Marching x 10 with one hand hold            PWR: up, rock, twist  and Step            [employer] hitting far away and up far away and down             Sit to stand x 15             Walk to the back hall in as few as steps as possible goal of 22              Side stepping with big arms x 2 RT                            02/26/22           Standing:            Wall arch x 10            Back against wall B shoulder flex without arching x 10            Rocker board x 2'            Tandem stance on foam x 3             Marching x 10 with one hand hold            PWR: up, rock, twist  and Step            Cone rotation x 2 Reps             Sit to stand x 15             Walk to the back hall in as few as steps as possible goal of 22                                    02/24/22 Nustep 5 min dynamic warmup (seat 9) lv 4     Heel raises 2x 10  Toe raise 2 x10                         Functional squat 2 x 15                        Tandem  stance 3 x 30" each   Stairs 4 inch 5 RT single rail   Standing march 3lb x25 each                        Standing hip abduction 3lb x 25 each    Standing hip extension 3lb x 25 each  02/19/22 2 MWT 283 feet no AD (shuffling gait, decreased ground clearance)          Standing                        Heel raises 2x 10  Toe raise 2 x10                        Functional squat 2 x 15                        Tandem stance 3 x 30" each  4 inch step up 2 x 10 each HHA x 2                          Standing hip abduction 2 x 10 each     Standing hip extension 2 x 10 each     02/17/2022:                        Standing:                        Heel raises x 10                        Functional squat x 10                        Tandem stance x 3 each                        Single leg stance x 3                        Side stepping x 2 rt at //                        Sitting:                        Sit to stand x 10                        Scapular retraction x 10                        Supine:                       Bridge x 10                        Active hamstring stretch 30" x 3                        Side lying:  Lt hip abduction x 15              PATIENT EDUCATION:  Education details: HEP Person educated: Patient Education method: Explanation Education comprehension: returned demonstration   HOME EXERCISE PROGRAM:  [place] 03/06/22 gastroc stretch on wall 02/19/22 - Heel Raises with Counter Support  - 2 x daily - 7 x weekly - 2 sets - 10 reps - Toe Raises with Counter Support  - 2 x daily - 7 x  weekly - 2 sets - 10 reps - Sit to Stand with Counter Support  - 2 x daily - 7 x weekly - 2 sets - 10 reps - Standing Hip Abduction with Counter Support  - 2 x daily - 7 x weekly - 2 sets - 10 reps - Standing Hip Extension with Counter Support  - 2 x daily - 7 x weekly - 2 sets - 10 reps  02/18/2023: [id number]:              Sitting scapular retraction x 10              Supine  active hamstring stretch 30" x 3  Evaluation:  Side lying hip abduction  ASSESSMENT:  CLINICAL IMPRESSION:   Today's session focused on strengthening and balance and normalizing gait pattern. Patient continues with shuffling gait pattern and tends to externally rotate left leg with standing and walking. Worked on lengthening steps with cone step overs and step to target today. Patient needs verbal cues for pacing as he tends to get in a hurry; shuffle his feet. Added gastroc stretch to HEP.  Patient will continue to benefit from skilled therapy services to reduce remaining deficits and improve functional ability.     OBJECTIVE IMPAIRMENTS decreased activity tolerance, decreased balance, difficulty walking, and decreased strength.   ACTIVITY LIMITATIONS carrying, lifting, and locomotion level  PARTICIPATION LIMITATIONS: community activity  PERSONAL FACTORS Age, Time since onset of injury/illness/exacerbation, and 3+ comorbidities: CVA, brady cardio, substance abuse, CABG  are also affecting patient's functional outcome.   REHAB POTENTIAL: Good  CLINICAL DECISION MAKING: Evolving/moderate complexity  EVALUATION COMPLEXITY: Moderate   GOALS: Goals reviewed with patient? No  SHORT TERM GOALS: Target date: 02/26/2022  PT to be I in HEP to decrease risk of falling  Baseline: Goal status: IN PROGRESS  2.  Pt mm strength to improve 1/2 grade to allow pt to come sit to stand 12  in 30 seconds to demonstrate improved power.   Baseline:  Goal status: IN PROGRESS  3.  Pt to be able to single leg stance on both LE for 10 seconds to reduce risk of falling  Baseline:  Goal status: IN PROGRESS   LONG TERM GOALS: Target date: 03/12/2022   PT to be I in advanced HEP to improve mm strength by one grade to allow improved mobility Baseline:  Goal status: IN PROGRESS  2.  Pt to be able to go up and down 8 steps with one handrail assist  Baseline:  Goal status: IN PROGRESS  3.  Pt to be  able to single leg stance on both LE for 20 " to reduce risk of falling.  Baseline:  Goal status: IN PROGRESS     PLAN: PT FREQUENCY: 2x/week  PT DURATION: 6 weeks  PLANNED INTERVENTIONS: Therapeutic exercises, Therapeutic activity, Neuromuscular re-education, Balance training, Gait training, Patient/Family education, Self Care, and Manual therapy  PLAN FOR NEXT SESSION:  Progress strengthening and balance.gait training  8:54 AM, 03/06/22 Konnor Vondrasek Small Kitti Mcclish MPT  physical therapy [place] [phone]

## 2022-03-09 ENCOUNTER — Encounter (HOSPITAL_COMMUNITY): Payer: Self-pay | Admitting: Physical Therapy

## 2022-03-09 ENCOUNTER — Telehealth: Payer: Self-pay | Admitting: Cardiology

## 2022-03-09 ENCOUNTER — Ambulatory Visit (HOSPITAL_COMMUNITY): Payer: Medicare Other | Attending: Internal Medicine | Admitting: Physical Therapy

## 2022-03-09 DIAGNOSIS — R296 Repeated falls: Secondary | ICD-10-CM | POA: Insufficient documentation

## 2022-03-09 DIAGNOSIS — M6281 Muscle weakness (generalized): Secondary | ICD-10-CM | POA: Insufficient documentation

## 2022-03-09 MED ORDER — APIXABAN 5 MG PO TABS: 5.0000 mg | ORAL_TABLET | Freq: Two times a day (BID) | ORAL | 1 refills | Status: DC

## 2022-03-09 NOTE — Telephone Encounter (Signed)
Pt c/o medication issue:  1. Name of Medication: ELIQUIS 5 MG TABS tablet  2. How are you currently taking this medication (dosage and times per day)? TAKE 1 TABLET BY MOUTH TWICE A DAY. Patient taking differently: Take 5 mg by mouth 2 (two) times daily.  3. Are you having a reaction (difficulty breathing--STAT)? No   4. What is your medication issue? Patient new a new prescription sent to  [place], [place]

## 2022-03-09 NOTE — Telephone Encounter (Signed)
Prescription refill request for Eliquis received. Indication: AF Last office visit: 02/10/22  S McDowell MD Scr: 0.71 on 01/14/22 Age: 66 Weight: 109kg  Based on above findings Eliquis 5mg twice daily is the appropriate dose.  Refill approved. 

## 2022-03-09 NOTE — Therapy (Signed)
OUTPATIENT PHYSICAL THERAPY LOWER EXTREMITY Treatment    Patient Name: Martin [name] MRN: [id number] DOB:04/02/1956, 66 y.o., male Today's Date: 03/09/2022  END OF SESSION:   PT End of Session - 03/09/22 0952     Visit Number 8    Number of Visits 12    Date for PT Re-Evaluation 03/12/22    Authorization Type UHC medicare    Progress Note Due on Visit 10    PT Start Time [id number]    PT Stop Time 1031    PT Time Calculation (min) 42 min    Activity Tolerance Patient tolerated treatment well    Behavior During Therapy [facility] for tasks assessed/performed                 Past Medical History:  Diagnosis Date   (HFpEF) heart failure with preserved ejection fraction (HCC)    CAD (coronary artery disease)    a. s/p PCI to LAD 2001. b. s/p stenting to the RCA and mid LAD December 2011, residual distal RCA disease treated medically. c.  Multivessel disease status post CABG October 2020   Carotid artery disease ([place])    Essential hypertension    GERD (gastroesophageal reflux disease)    Habitual alcohol use    Hyperlipidemia    Low back pain    Myocardial infarction ([place]) 2020   OSA on CPAP    Osteoarthritis    Permanent atrial fibrillation ([place])    a. dx 10/2017   Polysubstance abuse ([place])    History of marijuana and Vicodin abuse   PVC's (premature ventricular contractions)    Seasonal allergies    Stroke [facility]) 2020   Past Surgical History:  Procedure Laterality Date   CARDIOVERSION N/A 11/19/2017   Procedure: CARDIOVERSION;  Surgeon: Herminio Commons, MD;  [facility];  Service: Cardiovascular;  Laterality: N/A;   COLONOSCOPY WITH PROPOFOL N/A 03/01/2020   Procedure: COLONOSCOPY WITH PROPOFOL;  Surgeon: Harvel Quale, MD;  [facility];  Service: Gastroenterology;  Laterality: N/A;  730   CORONARY ANGIOPLASTY WITH STENT PLACEMENT  2011   CORONARY ARTERY BYPASS GRAFT N/A 03/10/2019   Procedure: CORONARY ARTERY BYPASS GRAFTING  (CABG)X 3 Using Left Internal Mammary Artery and Right Saphenous Vein Grafts;  Surgeon: Ivin Poot, MD;  Location: [place];  Service: Open Heart Surgery;  Laterality: N/A;   ESOPHAGOGASTRODUODENOSCOPY (EGD) WITH PROPOFOL N/A 03/01/2020   Procedure: ESOPHAGOGASTRODUODENOSCOPY (EGD) WITH PROPOFOL;  Surgeon: Harvel Quale, MD;  [facility];  Service: Gastroenterology;  Laterality: N/A;   LEFT HEART CATH AND CORONARY ANGIOGRAPHY N/A 03/08/2019   Procedure: LEFT HEART CATH AND CORONARY ANGIOGRAPHY;  Surgeon: Belva Crome, MD;  Location: [place] CV LAB;  Service: Cardiovascular;  Laterality: N/A;   POLYPECTOMY  03/01/2020   Procedure: POLYPECTOMY;  Surgeon: Harvel Quale, MD;  [facility];  Service: Gastroenterology;;   TEE WITHOUT CARDIOVERSION N/A 03/10/2019   Procedure: TRANSESOPHAGEAL ECHOCARDIOGRAM (TEE);  Surgeon: Prescott Gum, Collier Salina, MD;  Location: [place];  Service: Open Heart Surgery;  Laterality: N/A;   TONSILLECTOMY     Patient Active Problem List   Diagnosis Date Noted   Pleural effusion on right 01/26/2022   Gout 05/22/2021   Positive fecal occult blood test 01/25/2020   Anemia 01/25/2020   Left leg weakness 04/26/2019   Generalized anxiety disorder    Peripheral edema    Morbid obesity (HCC)    Acute gout of right ankle  Hypoalbuminemia due to protein-calorie malnutrition (HCC)    Acute blood loss anemia    Chronic diastolic congestive heart failure ([place])    Debility 03/27/2019   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 ([place]) 03/27/2019   Lethargy    Persistent atrial fibrillation (HCC)    Cerebral embolism with cerebral infarction 03/13/2019   Hx of CABG 03/10/2019   NSTEMI (non-ST elevated myocardial infarction) ([place]) 03/07/2019   Unstable angina ([place]) 03/07/2019   Anxiety 04/23/2018   Difficulty sleeping 01/06/2018   Seasonal allergies 01/04/2018   OSA on CPAP 05/27/2016   Obesity  06/10/2011   Insomnia 06/10/2011   PVC's (premature ventricular contractions)    CAD (coronary artery disease)    Carotid artery disease ([place])    DEGENERATIVE JOINT DISEASE 07/28/2010   Dyslipidemia 06/17/2010   SUBSTANCE ABUSE, MULTIPLE 06/17/2010   Essential hypertension 06/17/2010   CAD 06/17/2010   BRADYCARDIA 06/17/2010    PCP: Allyn Kenner  REFERRING PROVIDER: Allyn Kenner  REFERRING DIAG:  PT eval/tx for R29.6 repeated falls per Allyn Kenner, MD  THERAPY DIAG:  History of multiple falls  Rationale for Evaluation and Treatment Rehabilitation  ONSET DATE: 07/09/2021  SUBJECTIVE:   SUBJECTIVE STATEMENT: Reports he has been staying busy; denies any pain today. States he has been more careful when doing things and this has helped prevent from losing balance.   PERTINENT HISTORY: CVA, PVC, CABG, poly substance abuse  PAIN:  Are you having pain? Yes: NPRS scale: 0/10 Pain location: low back  Pain description: sharp, constant  Aggravating factors: sitting and getting up  Relieving factors: Meds    PRECAUTIONS: None  WEIGHT BEARING RESTRICTIONS No  FALLS:  Has patient fallen in last 6 months? Yes. Number of falls 2  LIVING ENVIRONMENT: Lives with: lives alone Lives in: House/apartment Stairs: Yes: Internal: 2 steps; on right going up Has following equipment at home: None  OCCUPATION: retired   PLOF: [place] not to fall,   OBJECTIVE:   DIAGNOSTIC FINDINGS:  IMPRESSION: 1. Unchanged appearance of subacute right ACA as well as ACA/MCA watershed territory infarcts. No new acute infarction is demonstrated. No intracranial hemorrhage. 2. Stable atrophy and chronic small vessel ischemic disease.     Electronically Signed   By: Kellie Simmering   On: 03/22/2019 17:13      COGNITION:  Overall cognitive status: Within functional limits for tasks assessed       POSTURE: rounded shoulders, forward head, decreased lumbar lordosis, increased  thoracic kyphosis, and flexed trunk    LOWER EXTREMITY MMT:  MMT Right eval Left eval  Hip flexion 5/5 4+/5  Hip extension 3/5 3-/5  Hip abduction 5/5 3/5  Hip adduction    Hip internal rotation    Hip external rotation    Knee flexion 5/5 4+  Knee extension 5/5 5/5  Ankle dorsiflexion 4/5 5/5  Ankle plantarflexion    Ankle inversion    Ankle eversion     (Blank rows = not tested)    FUNCTIONAL TESTS:  30 seconds chair stand test: 10 2 minute walk test: 283 feet no AD (shuffling gait, decreased ground clearance) Single leg stance:  Rt:  2" , Lt:  deferred due to walking shoe     TODAY'S TREATMENT: 03/09/22 Nustep 5 min dynamic warmup (seat 9) lv 4  Standing: Heel raises on incline 3 x 10 Toe raises 3 x 10 Slant board 5 x 20" 4" runner step-ups 2x10 Step length to target  using cones on blue line x 4 down and back Wide and narrow turns through cones x2 RT Step over cones blue line x 4 down and back Figure 8 steps between 2 cones backwards x3 RT SBA   03/06/22 Nustep 5 min dynamic warmup (seat 9) lv 4  Standing: Heel raises on incline 2 x 10 Toe raises 2 x 10 Slant board 5 x 20" Step length to target using cones on blue line x 4 down and back Step over cones blue line x 2 down and back Figure 8 walking down and back x 4              03/05/22            Standing:            Forefoot raise x 10            Slant board stretch 3 x 30"            Wall arch x 10            Back against wall B shoulder flex without arching x 10  w/2#                          Marching x 10 with one hand hold            PWR: up, rock, twist  and Step            [employer] hitting far away and up far away and down             Sit to stand x 15             Walk to the back hall in as few as steps as possible goal of 22              Side stepping with big arms x 2 RT                             PATIENT EDUCATION:  Education details: HEP Person educated: Patient Education  method: Explanation Education comprehension: returned demonstration   HOME EXERCISE PROGRAM:  [id number] 03/06/22 gastroc stretch on wall 02/19/22 - Heel Raises with Counter Support  - 2 x daily - 7 x weekly - 2 sets - 10 reps - Toe Raises with Counter Support  - 2 x daily - 7 x weekly - 2 sets - 10 reps - Sit to Stand with Counter Support  - 2 x daily - 7 x weekly - 2 sets - 10 reps - Standing Hip Abduction with Counter Support  - 2 x daily - 7 x weekly - 2 sets - 10 reps - Standing Hip Extension with Counter Support  - 2 x daily - 7 x weekly - 2 sets - 10 reps  02/18/2023: [id number]:              Sitting scapular retraction x 10              Supine active hamstring stretch 30" x 3  Evaluation:  Side lying hip abduction  ASSESSMENT:  CLINICAL IMPRESSION:   Further training for improvement of gait symmetry. Instructed for larger step length with short pause to further challenge balance using cones for targets along blue line. Navigating cones with progressively narrow distance between cones for sharper turns. Cues to reduce shuffling and for awareness with  backwards figure 8.     OBJECTIVE IMPAIRMENTS decreased activity tolerance, decreased balance, difficulty walking, and decreased strength.   ACTIVITY LIMITATIONS carrying, lifting, and locomotion level  PARTICIPATION LIMITATIONS: community activity  PERSONAL FACTORS Age, Time since onset of injury/illness/exacerbation, and 3+ comorbidities: CVA, brady cardio, substance abuse, CABG  are also affecting patient's functional outcome.   REHAB POTENTIAL: Good  CLINICAL DECISION MAKING: Evolving/moderate complexity  EVALUATION COMPLEXITY: Moderate   GOALS: Goals reviewed with patient? No  SHORT TERM GOALS: Target date: 02/26/2022  PT to be I in HEP to decrease risk of falling  Baseline: Goal status: IN PROGRESS  2.  Pt mm strength to improve 1/2 grade to allow pt to come sit to stand 12  in 30 seconds to demonstrate improved  power.   Baseline:  Goal status: IN PROGRESS  3.  Pt to be able to single leg stance on both LE for 10 seconds to reduce risk of falling  Baseline:  Goal status: IN PROGRESS   LONG TERM GOALS: Target date: 03/12/2022   PT to be I in advanced HEP to improve mm strength by one grade to allow improved mobility Baseline:  Goal status: IN PROGRESS  2.  Pt to be able to go up and down 8 steps with one handrail assist  Baseline:  Goal status: IN PROGRESS  3.  Pt to be able to single leg stance on both LE for 20 " to reduce risk of falling.  Baseline:  Goal status: IN PROGRESS     PLAN: PT FREQUENCY: 2x/week  PT DURATION: 6 weeks  PLANNED INTERVENTIONS: Therapeutic exercises, Therapeutic activity, Neuromuscular re-education, Balance training, Gait training, Patient/Family education, Self Care, and Manual therapy  PLAN FOR NEXT SESSION:  Progress strengthening and balance, gait training  10:36 AM, 03/09/22 Candie Mile, PT, DPT Physical Therapist [facility] [place]

## 2022-03-11 ENCOUNTER — Ambulatory Visit (HOSPITAL_COMMUNITY): Payer: Medicare Other | Admitting: Physical Therapy

## 2022-03-11 DIAGNOSIS — R296 Repeated falls: Secondary | ICD-10-CM

## 2022-03-11 DIAGNOSIS — M6281 Muscle weakness (generalized): Secondary | ICD-10-CM

## 2022-03-11 NOTE — Therapy (Signed)
OUTPATIENT PHYSICAL THERAPY LOWER EXTREMITY Treatment  Progress Note Reporting Period 01/15/2022 to 03/11/2022  See note below for Objective Data and Assessment of Progress/Goals.      Patient Name: Martin [name] MRN: [id number] DOB:14-Oct-1955, 66 y.o., male Today's Date: 03/11/2022  END OF SESSION:   PT End of Session - 03/11/22 1610     Visit Number 9    Number of Visits 17    Date for PT Re-Evaluation 04/09/22    Authorization Type U[employer] medicare    Progress Note Due on Visit 19    PT Start Time 1604    PT Stop Time 1648    PT Time Calculation (min) 44 min    Activity Tolerance Patient tolerated treatment well    Behavior During Therapy [facility] for tasks assessed/performed                 Past Medical History:  Diagnosis Date   (HFpEF) heart failure with preserved ejection fraction (HCC)    CAD (coronary artery disease)    a. s/p PCI to LAD 2001. b. s/p stenting to the RCA and mid LAD December 2011, residual distal RCA disease treated medically. c.  Multivessel disease status post CABG October 2020   Carotid artery disease ([place])    Essential hypertension    GERD (gastroesophageal reflux disease)    Habitual alcohol use    Hyperlipidemia    Low back pain    Myocardial infarction ([place]) 2020   OSA on CPAP    Osteoarthritis    Permanent atrial fibrillation ([place])    a. dx 10/2017   Polysubstance abuse ([place])    History of marijuana and Vicodin abuse   PVC's (premature ventricular contractions)    Seasonal allergies    Stroke [facility] 2020   Past Surgical History:  Procedure Laterality Date   CARDIOVERSION N/A 11/19/2017   Procedure: CARDIOVERSION;  Surgeon: Herminio Commons, MD;  [facility];  Service: Cardiovascular;  Laterality: N/A;   COLONOSCOPY WITH PROPOFOL N/A 03/01/2020   Procedure: COLONOSCOPY WITH PROPOFOL;  Surgeon: Harvel Quale, MD;  [facility];  Service: Gastroenterology;  Laterality: N/A;  730   CORONARY  ANGIOPLASTY WITH STENT PLACEMENT  2011   CORONARY ARTERY BYPASS GRAFT N/A 03/10/2019   Procedure: CORONARY ARTERY BYPASS GRAFTING (CABG)X 3 Using Left Internal Mammary Artery and Right Saphenous Vein Grafts;  Surgeon: Ivin Poot, MD;  Location: [place];  Service: Open Heart Surgery;  Laterality: N/A;   ESOPHAGOGASTRODUODENOSCOPY (EGD) WITH PROPOFOL N/A 03/01/2020   Procedure: ESOPHAGOGASTRODUODENOSCOPY (EGD) WITH PROPOFOL;  Surgeon: Harvel Quale, MD;  [facility];  Service: Gastroenterology;  Laterality: N/A;   LEFT HEART CATH AND CORONARY ANGIOGRAPHY N/A 03/08/2019   Procedure: LEFT HEART CATH AND CORONARY ANGIOGRAPHY;  Surgeon: Belva Crome, MD;  Location: [place] [facility];  Service: Cardiovascular;  Laterality: N/A;   POLYPECTOMY  03/01/2020   Procedure: POLYPECTOMY;  Surgeon: Harvel Quale, MD;  [facility];  Service: Gastroenterology;;   TEE WITHOUT CARDIOVERSION N/A 03/10/2019   Procedure: TRANSESOPHAGEAL ECHOCARDIOGRAM (TEE);  Surgeon: Prescott Gum, Collier Salina, MD;  Location: [place];  Service: Open Heart Surgery;  Laterality: N/A;   TONSILLECTOMY     Patient Active Problem List   Diagnosis Date Noted   Pleural effusion on right 01/26/2022   Gout 05/22/2021   Positive fecal occult blood test 01/25/2020   Anemia 01/25/2020   Left leg weakness 04/26/2019   Generalized anxiety disorder  Peripheral edema    Morbid obesity ([place])    Acute gout of right ankle    Hypoalbuminemia due to protein-calorie malnutrition (HCC)    Acute blood loss anemia    Chronic diastolic congestive heart failure ([place])    Debility 03/27/2019   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 ([place]) 03/27/2019   Lethargy    Persistent atrial fibrillation ([place])    Cerebral embolism with cerebral infarction 03/13/2019   Hx of CABG 03/10/2019   NSTEMI (non-ST elevated myocardial infarction) ([place]) 03/07/2019   Unstable angina ([place]) 03/07/2019    Anxiety 04/23/2018   Difficulty sleeping 01/06/2018   Seasonal allergies 01/04/2018   OSA on CPAP 05/27/2016   Obesity 06/10/2011   Insomnia 06/10/2011   PVC's (premature ventricular contractions)    CAD (coronary artery disease)    Carotid artery disease ([place])    DEGENERATIVE JOINT DISEASE 07/28/2010   Dyslipidemia 06/17/2010   SUBSTANCE ABUSE, MULTIPLE 06/17/2010   Essential hypertension 06/17/2010   CAD 06/17/2010   BRADYCARDIA 06/17/2010    PCP: Allyn Kenner  REFERRING PROVIDER: Allyn Kenner  REFERRING DIAG:  PT eval/tx for R29.6 repeated falls per Allyn Kenner, MD  THERAPY DIAG:  History of multiple falls  Rationale for Evaluation and Treatment Rehabilitation  ONSET DATE: 07/09/2021  SUBJECTIVE:   SUBJECTIVE STATEMENT: Reports he feels he has improved 25%.  Continues to go to cardiac rehab on Mon/Wed/Fri.  Feels his balance is still the most challenged.   PERTINENT HISTORY: CVA, PVC, CABG, poly substance abuse  PAIN:  Are you having pain? Yes: NPRS scale: 0/10 Pain location: low back  Pain description: sharp, constant  Aggravating factors: sitting and getting up  Relieving factors: Meds    PRECAUTIONS: None  WEIGHT BEARING RESTRICTIONS No  FALLS:  Has patient fallen in last 6 months? Yes. Number of falls 2  LIVING ENVIRONMENT: Lives with: lives alone Lives in: House/apartment Stairs: Yes: Internal: 2 steps; on right going up Has following equipment at home: None  OCCUPATION: retired   PLOF: [place] not to fall,   OBJECTIVE:   DIAGNOSTIC FINDINGS:  IMPRESSION: 1. Unchanged appearance of subacute right ACA as well as ACA/MCA watershed territory infarcts. No new acute infarction is demonstrated. No intracranial hemorrhage. 2. Stable atrophy and chronic small vessel ischemic disease.     Electronically Signed   By: Kellie Simmering   On: 03/22/2019 17:13      COGNITION:  Overall cognitive status: Within functional limits for tasks  assessed       POSTURE: rounded shoulders, forward head, decreased lumbar lordosis, increased thoracic kyphosis, and flexed trunk    LOWER EXTREMITY MMT:  MMT Right eval Right 03/11/22 Left eval Left 03/11/22  Hip flexion 5/5 5/5 4+/5 5/5  Hip extension 3/5 3+/5 3-/5 3/5  Hip abduction 5/5 5/5 3/5 3+/5  Hip adduction      Hip internal rotation      Hip external rotation      Knee flexion 5/5 5/5 4+ 4+/5  Knee extension 5/5 5/5 5/5 5/5  Ankle dorsiflexion 4/5 5/5 5/5 5/5  Ankle plantarflexion      Ankle inversion      Ankle eversion       (Blank rows = not tested)    FUNCTIONAL TESTS:  30 seconds chair stand test: 10 2 minute walk test: 283 feet no AD (shuffling gait, decreased ground clearance) Single leg stance:  Rt:  2" , Lt:  deferred due to  walking shoe   TODAY'S TREATMENT: 03/11/22 Progress note/recert completed Functional test measures:  2MWT 316 feet (was 283 feet)  30 second chair test: 14X (was 10X)  Single leg stance:  Rt:2"  (was 2"), Lt: 1" (was deferred/unable)  MMT completed see above chart  Goal review (STG met, 0 LTG) Tandem stance:  5" Rt leading, 10" Lt leading max without UE assist Nustep 5 minutes level 4 seat 9  03/09/22 Nustep 5 min dynamic warmup (seat 9) lv 4  Standing: Heel raises on incline 3 x 10 Toe raises 3 x 10 Slant board 5 x 20" 4" runner step-ups 2x10 Step length to target using cones on blue line x 4 down and back Wide and narrow turns through cones x2 RT Step over cones blue line x 4 down and back Figure 8 steps between 2 cones backwards x3 RT SBA   03/06/22 Nustep 5 min dynamic warmup (seat 9) lv 4  Standing: Heel raises on incline 2 x 10 Toe raises 2 x 10 Slant board 5 x 20" Step length to target using cones on blue line x 4 down and back Step over cones blue line x 2 down and back Figure 8 walking down and back x 4              03/05/22            Standing:            Forefoot raise x 10            Slant  board stretch 3 x 30"            Wall arch x 10            Back against wall B shoulder flex without arching x 10  w/2#                          Marching x 10 with one hand hold            PWR: up, rock, twist  and Step            [employer] hitting far away and up far away and down             Sit to stand x 15             Walk to the back hall in as few as steps as possible goal of 22              Side stepping with big arms x 2 RT                             PATIENT EDUCATION:  Education details: HEP Person educated: Patient Education method: Explanation Education comprehension: returned demonstration   HOME EXERCISE PROGRAM: [id number] 03/06/22 gastroc stretch on wall 02/19/22 - Heel Raises with Counter Support  - 2 x daily - 7 x weekly - 2 sets - 10 reps - Toe Raises with Counter Support  - 2 x daily - 7 x weekly - 2 sets - 10 reps - Sit to Stand with Counter Support  - 2 x daily - 7 x weekly - 2 sets - 10 reps - Standing Hip Abduction with Counter Support  - 2 x daily - 7 x weekly - 2 sets - 10 reps - Standing Hip Extension with Counter Support  - 2 x  daily - 7 x weekly - 2 sets - 10 reps  02/18/2023: [id number]:              Sitting scapular retraction x 10              Supine active hamstring stretch 30" x 3  Evaluation:  Side lying hip abduction  ASSESSMENT:  CLINICAL IMPRESSION:   Progress note and recert completed this session.  PT with improved functional test measures and MMT.  Pt has met 2/3 STG and progressing towards LTG's.  Pt would benefit from continued skilled therapy to progress towards unmet goals and improve his overall safety and function.  OBJECTIVE IMPAIRMENTS decreased activity tolerance, decreased balance, difficulty walking, and decreased strength.   ACTIVITY LIMITATIONS carrying, lifting, and locomotion level  PARTICIPATION LIMITATIONS: community activity  PERSONAL FACTORS Age, Time since onset of injury/illness/exacerbation, and 3+ comorbidities:  CVA, brady cardio, substance abuse, CABG  are also affecting patient's functional outcome.   REHAB POTENTIAL: Good  CLINICAL DECISION MAKING: Evolving/moderate complexity  EVALUATION COMPLEXITY: Moderate   GOALS: Goals reviewed with patient? Yes  SHORT TERM GOALS: Target date: 02/26/2022  PT to be I in HEP to decrease risk of falling  Baseline: Goal status: MET  2.  Pt mm strength to improve 1/2 grade to allow pt to come sit to stand 12  in 30 seconds to demonstrate improved power.   Baseline:  Goal status: MET  3.  Pt to be able to single leg stance on both LE for 10 seconds to reduce risk of falling  Baseline:  Goal status: IN PROGRESS   LONG TERM GOALS: Target date: 03/12/2022   PT to be I in advanced HEP to improve mm strength by one grade to allow improved mobility Baseline:  Goal status: IN PROGRESS  2.  Pt to be able to go up and down 8 steps with one handrail assist  Baseline:  Goal status: IN PROGRESS  3.  Pt to be able to single leg stance on both LE for 20 " to reduce risk of falling.  Baseline:  Goal status: IN PROGRESS     PLAN: PT FREQUENCY: 2x/week  PT DURATION: 4 weeks  PLANNED INTERVENTIONS: Therapeutic exercises, Therapeutic activity, Neuromuscular re-education, Balance training, Gait training, Patient/Family education, Self Care, and Manual therapy  PLAN FOR NEXT SESSION:  Progress strengthening and balance, gait training.  Progress balance challenges.  4:41 PM, 03/11/22 Teena Irani, PTA/CLT [place] Ph: [place], [place] CLT [phone]

## 2022-03-12 NOTE — Addendum Note (Signed)
Addended by: Leeroy Cha on: 03/12/2022 01:27 PM   Modules accepted: Orders

## 2022-03-17 ENCOUNTER — Ambulatory Visit (HOSPITAL_COMMUNITY): Payer: Medicare Other

## 2022-03-17 DIAGNOSIS — M6281 Muscle weakness (generalized): Secondary | ICD-10-CM | POA: Diagnosis not present

## 2022-03-17 DIAGNOSIS — R296 Repeated falls: Secondary | ICD-10-CM | POA: Diagnosis not present

## 2022-03-17 NOTE — Therapy (Signed)
OUTPATIENT PHYSICAL THERAPY LOWER EXTREMITY Treatment     Patient Name: Martin [name] MRN: [id number] DOB:[birth year], 66 y.o., male Today's Date: 03/17/2022  END OF SESSION:   PT End of Session - 03/17/22 1346     Visit Number 10    Number of Visits 17    Date for PT Re-Evaluation 04/09/22    Authorization Type UHC medicare    Progress Note Due on Visit 75    PT Start Time 1345    PT Stop Time 1425    PT Time Calculation (min) 40 min    Activity Tolerance Patient tolerated treatment well    Behavior During Therapy [facility] for tasks assessed/performed                  Past Medical History:  Diagnosis Date   (HFpEF) heart failure with preserved ejection fraction ([place])    CAD (coronary artery disease)    a. s/p PCI to LAD 2001. b. s/p stenting to the RCA and mid LAD December 2011, residual distal RCA disease treated medically. c.  Multivessel disease status post CABG October 2020   Carotid artery disease ([place])    Essential hypertension    GERD (gastroesophageal reflux disease)    Habitual alcohol use    Hyperlipidemia    Low back pain    Myocardial infarction ([place]) 2020   OSA on CPAP    Osteoarthritis    Permanent atrial fibrillation ([place])    a. dx 10/2017   Polysubstance abuse ([place])    History of marijuana and Vicodin abuse   PVC's (premature ventricular contractions)    Seasonal allergies    Stroke [facility]) 2020   Past Surgical History:  Procedure Laterality Date   CARDIOVERSION N/A 11/19/2017   Procedure: CARDIOVERSION;  Surgeon: Herminio Commons, MD;  [facility];  Service: Cardiovascular;  Laterality: N/A;   COLONOSCOPY WITH PROPOFOL N/A 03/01/2020   Procedure: COLONOSCOPY WITH PROPOFOL;  Surgeon: Harvel Quale, MD;  [facility];  Service: Gastroenterology;  Laterality: N/A;  730   CORONARY ANGIOPLASTY WITH STENT PLACEMENT  2011   CORONARY ARTERY BYPASS GRAFT N/A 03/10/2019   Procedure: CORONARY ARTERY BYPASS GRAFTING  (CABG)X 3 Using Left Internal Mammary Artery and Right Saphenous Vein Grafts;  Surgeon: Ivin Poot, MD;  Location: [place];  Service: Open Heart Surgery;  Laterality: N/A;   ESOPHAGOGASTRODUODENOSCOPY (EGD) WITH PROPOFOL N/A 03/01/2020   Procedure: ESOPHAGOGASTRODUODENOSCOPY (EGD) WITH PROPOFOL;  Surgeon: Harvel Quale, MD;  [facility];  Service: Gastroenterology;  Laterality: N/A;   LEFT HEART CATH AND CORONARY ANGIOGRAPHY N/A 03/08/2019   Procedure: LEFT HEART CATH AND CORONARY ANGIOGRAPHY;  Surgeon: Belva Crome, MD;  Location: [place] [facility];  Service: Cardiovascular;  Laterality: N/A;   POLYPECTOMY  03/01/2020   Procedure: POLYPECTOMY;  Surgeon: Harvel Quale, MD;  [facility];  Service: Gastroenterology;;   TEE WITHOUT CARDIOVERSION N/A 03/10/2019   Procedure: TRANSESOPHAGEAL ECHOCARDIOGRAM (TEE);  Surgeon: Prescott Gum, Collier Salina, MD;  Location: [place];  Service: Open Heart Surgery;  Laterality: N/A;   TONSILLECTOMY     Patient Active Problem List   Diagnosis Date Noted   Pleural effusion on right 01/26/2022   Gout 05/22/2021   Positive fecal occult blood test 01/25/2020   Anemia 01/25/2020   Left leg weakness 04/26/2019   Generalized anxiety disorder    Peripheral edema    Morbid obesity (HCC)    Acute gout of right ankle  Hypoalbuminemia due to protein-calorie malnutrition (HCC)    Acute blood loss anemia    Chronic diastolic congestive heart failure (HCC)    Debility 03/27/2019   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 (HCC) 03/27/2019   Lethargy    Persistent atrial fibrillation (HCC)    Cerebral embolism with cerebral infarction 03/13/2019   Hx of CABG 03/10/2019   NSTEMI (non-ST elevated myocardial infarction) (HCC) 03/07/2019   Unstable angina (HCC) 03/07/2019   Anxiety 04/23/2018   Difficulty sleeping 01/06/2018   Seasonal allergies 01/04/2018   OSA on CPAP 05/27/2016   Obesity  06/10/2011   Insomnia 06/10/2011   PVC's (premature ventricular contractions)    CAD (coronary artery disease)    Carotid artery disease (HCC)    DEGENERATIVE JOINT DISEASE 07/28/2010   Dyslipidemia 06/17/2010   SUBSTANCE ABUSE, MULTIPLE 06/17/2010   Essential hypertension 06/17/2010   CAD 06/17/2010   BRADYCARDIA 06/17/2010    PCP: John Hall  REFERRING PROVIDER: John Hall  REFERRING DIAG:  PT eval/tx for R29.6 repeated falls per John Hall, MD  THERAPY DIAG:  History of multiple falls  Rationale for Evaluation and Treatment Rehabilitation  ONSET DATE: 07/09/2021  SUBJECTIVE:   SUBJECTIVE STATEMENT: no pain today; no new falls  PERTINENT HISTORY: CVA, PVC, CABG, poly substance abuse  PAIN:  Are you having pain? Yes: NPRS scale: 0/10 Pain location: low back  Pain description: sharp, constant  Aggravating factors: sitting and getting up  Relieving factors: Meds    PRECAUTIONS: None  WEIGHT BEARING RESTRICTIONS No  FALLS:  Has patient fallen in last 6 months? Yes. Number of falls 2  LIVING ENVIRONMENT: Lives with: lives alone Lives in: House/apartment Stairs: Yes: Internal: 2 steps; on right going up Has following equipment at home: None  OCCUPATION: retired   PLOF: Independent  PATIENT GOALS not to fall,   OBJECTIVE:   DIAGNOSTIC FINDINGS:  IMPRESSION: 1. Unchanged appearance of subacute right ACA as well as ACA/MCA watershed territory infarcts. No new acute infarction is demonstrated. No intracranial hemorrhage. 2. Stable atrophy and chronic small vessel ischemic disease.     Electronically Signed   By: Kyle  Golden   On: 03/22/2019 17:13      COGNITION:  Overall cognitive status: Within functional limits for tasks assessed       POSTURE: rounded shoulders, forward head, decreased lumbar lordosis, increased thoracic kyphosis, and flexed trunk    LOWER EXTREMITY MMT:  MMT Right eval Right 03/11/22 Left eval Left 03/11/22  Hip  flexion 5/5 5/5 4+/5 5/5  Hip extension 3/5 3+/5 3-/5 3/5  Hip abduction 5/5 5/5 3/5 3+/5  Hip adduction      Hip internal rotation      Hip external rotation      Knee flexion 5/5 5/5 4+ 4+/5  Knee extension 5/5 5/5 5/5 5/5  Ankle dorsiflexion 4/5 5/5 5/5 5/5  Ankle plantarflexion      Ankle inversion      Ankle eversion       (Blank rows = not tested)    FUNCTIONAL TESTS:  30 seconds chair stand test: 10 2 minute walk test: 283 feet no AD (shuffling gait, decreased ground clearance) Single leg stance:  Rt:  2" , Lt:  deferred due to walking shoe   TODAY'S TREATMENT: 03/17/2022 Standing: Hip abduction 2 x 10 each Hip extension 2 x 10 each Slant board 5 x 20" Mini squats 2 x 10 Marching with 2 finger tip assist x 10 Sidestepping   blue line 15 ft down and back x 2 Step length to target using cones on blue line (5 cones) x 4 down and back Figure 8 walking down and back x 3 ( 5 cones)  Sit to stand 2 x 10 no UE assist  Step navigation 7" steps up and down x 3 with one handrail  Seated: 3# heel raises x 30  03/11/22 Progress note/recert completed Functional test measures:  2MWT 316 feet (was 283 feet)  30 second chair test: 14X (was 10X)  Single leg stance:  Rt:2"  (was 2"), Lt: 1" (was deferred/unable)  MMT completed see above chart  Goal review (STG met, 0 LTG) Tandem stance:  5" Rt leading, 10" Lt leading max without UE assist Nustep 5 minutes level 4 seat 9  03/09/22 Nustep 5 min dynamic warmup (seat 9) lv 4  Standing: Heel raises on incline 3 x 10 Toe raises 3 x 10 Slant board 5 x 20" 4" runner step-ups 2x10 Step length to target using cones on blue line x 4 down and back Wide and narrow turns through cones x2 RT Step over cones blue line x 4 down and back Figure 8 steps between 2 cones backwards x3 RT SBA   03/06/22 Nustep 5 min dynamic warmup (seat 9) lv 4  Standing: Heel raises on incline 2 x 10 Toe raises 2 x 10 Slant board 5 x 20" Step length  to target using cones on blue line x 4 down and back Step over cones blue line x 2 down and back Figure 8 walking down and back x 4              03/05/22            Standing:            Forefoot raise x 10            Slant board stretch 3 x 30"            Wall arch x 10            Back against wall B shoulder flex without arching x 10  w/2#                          Marching x 10 with one hand hold            PWR: up, rock, twist  and Step            [employer] hitting far away and up far away and down             Sit to stand x 15             Walk to the back hall in as few as steps as possible goal of 22              Side stepping with big arms x 2 RT                             PATIENT EDUCATION:  Education details: HEP Person educated: Patient Education method: Explanation Education comprehension: returned demonstration   HOME EXERCISE PROGRAM: [id number] 03/06/22 gastroc stretch on wall 02/19/22 - Heel Raises with Counter Support  - 2 x daily - 7 x weekly - 2 sets - 10 reps - Toe Raises with Counter Support  - 2 x daily - 7 x weekly -   2 sets - 10 reps - Sit to Stand with Counter Support  - 2 x daily - 7 x weekly - 2 sets - 10 reps - Standing Hip Abduction with Counter Support  - 2 x daily - 7 x weekly - 2 sets - 10 reps - Standing Hip Extension with Counter Support  - 2 x daily - 7 x weekly - 2 sets - 10 reps  02/18/2023: [id number]:              Sitting scapular retraction x 10              Supine active hamstring stretch 30" x 3  Evaluation:  Side lying hip abduction  ASSESSMENT:  CLINICAL IMPRESSION:   Today's session continued to focus on lower extremity strengthening and balance. Patient still with difficulty lengthening his steps and takes small shuffling steps.   Needs occasional rest breaks for shortness of breath. Needs CGA for standing activity without UE assist.   Pt would benefit from continued skilled therapy to progress towards unmet goals and improve his  overall safety and function.  OBJECTIVE IMPAIRMENTS decreased activity tolerance, decreased balance, difficulty walking, and decreased strength.   ACTIVITY LIMITATIONS carrying, lifting, and locomotion level  PARTICIPATION LIMITATIONS: community activity  PERSONAL FACTORS Age, Time since onset of injury/illness/exacerbation, and 3+ comorbidities: CVA, brady cardio, substance abuse, CABG  are also affecting patient's functional outcome.   REHAB POTENTIAL: Good  CLINICAL DECISION MAKING: Evolving/moderate complexity  EVALUATION COMPLEXITY: Moderate   GOALS: Goals reviewed with patient? Yes  SHORT TERM GOALS: Target date: 02/26/2022  PT to be I in HEP to decrease risk of falling  Baseline: Goal status: MET  2.  Pt mm strength to improve 1/2 grade to allow pt to come sit to stand 12  in 30 seconds to demonstrate improved power.   Baseline:  Goal status: MET  3.  Pt to be able to single leg stance on both LE for 10 seconds to reduce risk of falling  Baseline:  Goal status: IN PROGRESS   LONG TERM GOALS: Target date: 03/12/2022   PT to be I in advanced HEP to improve mm strength by one grade to allow improved mobility Baseline:  Goal status: IN PROGRESS  2.  Pt to be able to go up and down 8 steps with one handrail assist  Baseline:  Goal status: IN PROGRESS  3.  Pt to be able to single leg stance on both LE for 20 " to reduce risk of falling.  Baseline:  Goal status: IN PROGRESS     PLAN: PT FREQUENCY: 2x/week  PT DURATION: 4 weeks  PLANNED INTERVENTIONS: Therapeutic exercises, Therapeutic activity, Neuromuscular re-education, Balance training, Gait training, Patient/Family education, Self Care, and Manual therapy  PLAN FOR NEXT SESSION:  Progress strengthening and balance, gait training.  Progress balance challenges.   2:28 PM, 03/17/22 Amy Small Lynch MPT [facility] [place] [id number] Ph:[phone]  

## 2022-03-18 ENCOUNTER — Encounter (HOSPITAL_COMMUNITY): Payer: Medicare Other

## 2022-03-19 ENCOUNTER — Ambulatory Visit (HOSPITAL_COMMUNITY): Payer: Medicare Other

## 2022-03-19 ENCOUNTER — Encounter (HOSPITAL_COMMUNITY): Payer: Self-pay

## 2022-03-19 DIAGNOSIS — R296 Repeated falls: Secondary | ICD-10-CM | POA: Diagnosis not present

## 2022-03-19 DIAGNOSIS — M6281 Muscle weakness (generalized): Secondary | ICD-10-CM | POA: Diagnosis not present

## 2022-03-19 NOTE — Therapy (Signed)
OUTPATIENT PHYSICAL THERAPY LOWER EXTREMITY Treatment     Patient Name: Martin [name] MRN: [id number] DOB:November 29, 1955, 66 y.o., male Today's Date: 03/19/2022  END OF SESSION:   PT End of Session - 03/19/22 0736     Visit Number 11    Number of Visits 17    Date for PT Re-Evaluation 04/09/22    Authorization Type UHC medicare    Progress Note Due on Visit 82    PT Start Time 0736   late sign in   PT Stop Time 0814    PT Time Calculation (min) 38 min    Activity Tolerance Patient tolerated treatment well    Behavior During Therapy [facility] for tasks assessed/performed                  Past Medical History:  Diagnosis Date   (HFpEF) heart failure with preserved ejection fraction (HCC)    CAD (coronary artery disease)    a. s/p PCI to LAD 2001. b. s/p stenting to the RCA and mid LAD December 2011, residual distal RCA disease treated medically. c.  Multivessel disease status post CABG October 2020   Carotid artery disease ([place])    Essential hypertension    GERD (gastroesophageal reflux disease)    Habitual alcohol use    Hyperlipidemia    Low back pain    Myocardial infarction ([place]) 2020   OSA on CPAP    Osteoarthritis    Permanent atrial fibrillation ([place])    a. dx 10/2017   Polysubstance abuse ([place])    History of marijuana and Vicodin abuse   PVC's (premature ventricular contractions)    Seasonal allergies    Stroke [facility]) 2020   Past Surgical History:  Procedure Laterality Date   CARDIOVERSION N/A 11/19/2017   Procedure: CARDIOVERSION;  Surgeon: Herminio Commons, MD;  [facility];  Service: Cardiovascular;  Laterality: N/A;   COLONOSCOPY WITH PROPOFOL N/A 03/01/2020   Procedure: COLONOSCOPY WITH PROPOFOL;  Surgeon: Harvel Quale, MD;  [facility];  Service: Gastroenterology;  Laterality: N/A;  730   CORONARY ANGIOPLASTY WITH STENT PLACEMENT  2011   CORONARY ARTERY BYPASS GRAFT N/A 03/10/2019   Procedure: CORONARY ARTERY  BYPASS GRAFTING (CABG)X 3 Using Left Internal Mammary Artery and Right Saphenous Vein Grafts;  Surgeon: Ivin Poot, MD;  Location: [place];  Service: Open Heart Surgery;  Laterality: N/A;   ESOPHAGOGASTRODUODENOSCOPY (EGD) WITH PROPOFOL N/A 03/01/2020   Procedure: ESOPHAGOGASTRODUODENOSCOPY (EGD) WITH PROPOFOL;  Surgeon: Harvel Quale, MD;  [facility];  Service: Gastroenterology;  Laterality: N/A;   LEFT HEART CATH AND CORONARY ANGIOGRAPHY N/A 03/08/2019   Procedure: LEFT HEART CATH AND CORONARY ANGIOGRAPHY;  Surgeon: Belva Crome, MD;  Location: [place] CV LAB;  Service: Cardiovascular;  Laterality: N/A;   POLYPECTOMY  03/01/2020   Procedure: POLYPECTOMY;  Surgeon: Harvel Quale, MD;  [facility];  Service: Gastroenterology;;   TEE WITHOUT CARDIOVERSION N/A 03/10/2019   Procedure: TRANSESOPHAGEAL ECHOCARDIOGRAM (TEE);  Surgeon: Prescott Gum, Collier Salina, MD;  Location: [place];  Service: Open Heart Surgery;  Laterality: N/A;   TONSILLECTOMY     Patient Active Problem List   Diagnosis Date Noted   Pleural effusion on right 01/26/2022   Gout 05/22/2021   Positive fecal occult blood test 01/25/2020   Anemia 01/25/2020   Left leg weakness 04/26/2019   Generalized anxiety disorder    Peripheral edema    Morbid obesity ([place])    Acute  gout of right ankle    Hypoalbuminemia due to protein-calorie malnutrition (HCC)    Acute blood loss anemia    Chronic diastolic congestive heart failure ([place])    Debility 03/27/2019   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 ([place]) 03/27/2019   Lethargy    Persistent atrial fibrillation ([place])    Cerebral embolism with cerebral infarction 03/13/2019   Hx of CABG 03/10/2019   NSTEMI (non-ST elevated myocardial infarction) ([place]) 03/07/2019   Unstable angina ([place]) 03/07/2019   Anxiety 04/23/2018   Difficulty sleeping 01/06/2018   Seasonal allergies 01/04/2018   OSA on CPAP 05/27/2016    Obesity 06/10/2011   Insomnia 06/10/2011   PVC's (premature ventricular contractions)    CAD (coronary artery disease)    Carotid artery disease ([place])    DEGENERATIVE JOINT DISEASE 07/28/2010   Dyslipidemia 06/17/2010   SUBSTANCE ABUSE, MULTIPLE 06/17/2010   Essential hypertension 06/17/2010   CAD 06/17/2010   BRADYCARDIA 06/17/2010    PCP: Allyn Kenner  REFERRING PROVIDER: Allyn Kenner  REFERRING DIAG:  PT eval/tx for R29.6 repeated falls per Allyn Kenner, MD  THERAPY DIAG:  History of multiple falls  Rationale for Evaluation and Treatment Rehabilitation  ONSET DATE: 07/09/2021  SUBJECTIVE:   SUBJECTIVE STATEMENT: No reports of pain of recent falls.  Reports compliance with HEP daily.  PERTINENT HISTORY: CVA, PVC, CABG, poly substance abuse  PAIN:  Are you having pain? Yes: NPRS scale: 0/10 Pain location: low back  Pain description: sharp, constant  Aggravating factors: sitting and getting up  Relieving factors: Meds    PRECAUTIONS: None  WEIGHT BEARING RESTRICTIONS No  FALLS:  Has patient fallen in last 6 months? Yes. Number of falls 2  LIVING ENVIRONMENT: Lives with: lives alone Lives in: House/apartment Stairs: Yes: Internal: 2 steps; on right going up Has following equipment at home: None  OCCUPATION: retired   PLOF: [place] not to fall,   OBJECTIVE:   DIAGNOSTIC FINDINGS:  IMPRESSION: 1. Unchanged appearance of subacute right ACA as well as ACA/MCA watershed territory infarcts. No new acute infarction is demonstrated. No intracranial hemorrhage. 2. Stable atrophy and chronic small vessel ischemic disease.     Electronically Signed   By: Kellie Simmering   On: 03/22/2019 17:13      COGNITION:  Overall cognitive status: Within functional limits for tasks assessed       POSTURE: rounded shoulders, forward head, decreased lumbar lordosis, increased thoracic kyphosis, and flexed trunk    LOWER EXTREMITY MMT:  MMT Right eval  Right 03/11/22 Left eval Left 03/11/22  Hip flexion 5/5 5/5 4+/5 5/5  Hip extension 3/5 3+/5 3-/5 3/5  Hip abduction 5/5 5/5 3/5 3+/5  Hip adduction      Hip internal rotation      Hip external rotation      Knee flexion 5/5 5/5 4+ 4+/5  Knee extension 5/5 5/5 5/5 5/5  Ankle dorsiflexion 4/5 5/5 5/5 5/5  Ankle plantarflexion      Ankle inversion      Ankle eversion       (Blank rows = not tested)    FUNCTIONAL TESTS:  30 seconds chair stand test: 10 2 minute walk test: 283 feet no AD (shuffling gait, decreased ground clearance) Single leg stance:  Rt:  2" , Lt:  deferred due to walking shoe   TODAY'S TREATMENT: 03/19/22 Quadruped: attempted hip extension, pt wearing slippery pants Prone: hip extension 10x 5" Toe tapping with opposite UE  in arm on 6in step board Tandem stance 2x 30" Squat 15x Sidestep RTB 2RT  STS 10x eccentric control no HHA  03/17/2022 Standing: Hip abduction 2 x 10 each Hip extension 2 x 10 each Slant board 5 x 20" Mini squats 2 x 10 Marching with 2 finger tip assist x 10 Sidestepping blue line 15 ft down and back x 2 Step length to target using cones on blue line (5 cones) x 4 down and back Figure 8 walking down and back x 3 ( 5 cones)  Sit to stand 2 x 10 no UE assist  Step navigation 7" steps up and down x 3 with one handrail  Seated: 3# heel raises x 30  03/11/22 Progress note/recert completed Functional test measures:  2MWT 316 feet (was 283 feet)  30 second chair test: 14X (was 10X)  Single leg stance:  Rt:2"  (was 2"), Lt: 1" (was deferred/unable)  MMT completed see above chart  Goal review (STG met, 0 LTG) Tandem stance:  5" Rt leading, 10" Lt leading max without UE assist Nustep 5 minutes level 4 seat 9  03/09/22 Nustep 5 min dynamic warmup (seat 9) lv 4  Standing: Heel raises on incline 3 x 10 Toe raises 3 x 10 Slant board 5 x 20" 4" runner step-ups 2x10 Step length to target using cones on blue line x 4 down and  back Wide and narrow turns through cones x2 RT Step over cones blue line x 4 down and back Figure 8 steps between 2 cones backwards x3 RT SBA   03/06/22 Nustep 5 min dynamic warmup (seat 9) lv 4  Standing: Heel raises on incline 2 x 10 Toe raises 2 x 10 Slant board 5 x 20" Step length to target using cones on blue line x 4 down and back Step over cones blue line x 2 down and back Figure 8 walking down and back x 4              03/05/22            Standing:            Forefoot raise x 10            Slant board stretch 3 x 30"            Wall arch x 10            Back against wall B shoulder flex without arching x 10  w/2#                          Marching x 10 with one hand hold            PWR: up, rock, twist  and Step            [employer] hitting far away and up far away and down             Sit to stand x 15             Walk to the back hall in as few as steps as possible goal of 22              Side stepping with big arms x 2 RT                             PATIENT EDUCATION:  Education details: HEP Person educated:  Patient Education method: Explanation Education comprehension: returned demonstration   HOME EXERCISE PROGRAM: [id number] 03/06/22 gastroc stretch on wall 02/19/22 - Heel Raises with Counter Support  - 2 x daily - 7 x weekly - 2 sets - 10 reps - Toe Raises with Counter Support  - 2 x daily - 7 x weekly - 2 sets - 10 reps - Sit to Stand with Counter Support  - 2 x daily - 7 x weekly - 2 sets - 10 reps - Standing Hip Abduction with Counter Support  - 2 x daily - 7 x weekly - 2 sets - 10 reps - Standing Hip Extension with Counter Support  - 2 x daily - 7 x weekly - 2 sets - 10 reps  02/18/2023: [id number]:              Sitting scapular retraction x 10              Supine active hamstring stretch 30" x 3  Evaluation:  Side lying hip abduction  ASSESSMENT:  CLINICAL IMPRESSION:   Continued session focus LE strengthening and balance training.  Added prone  exercises to target gluteal weakness.  Increased difficulty with less UE assistance and coordination activities with toe tapping during opposite UE flexion to progress balance with min guard/A.  Pt tolerated well to session.  Noted improved gait mechanics at EOS.    OBJECTIVE IMPAIRMENTS decreased activity tolerance, decreased balance, difficulty walking, and decreased strength.   ACTIVITY LIMITATIONS carrying, lifting, and locomotion level  PARTICIPATION LIMITATIONS: community activity  PERSONAL FACTORS Age, Time since onset of injury/illness/exacerbation, and 3+ comorbidities: CVA, brady cardio, substance abuse, CABG  are also affecting patient's functional outcome.   REHAB POTENTIAL: Good  CLINICAL DECISION MAKING: Evolving/moderate complexity  EVALUATION COMPLEXITY: Moderate   GOALS: Goals reviewed with patient? Yes  SHORT TERM GOALS: Target date: 02/26/2022  PT to be I in HEP to decrease risk of falling  Baseline: Goal status: MET  2.  Pt mm strength to improve 1/2 grade to allow pt to come sit to stand 12  in 30 seconds to demonstrate improved power.   Baseline:  Goal status: MET  3.  Pt to be able to single leg stance on both LE for 10 seconds to reduce risk of falling  Baseline:  Goal status: IN PROGRESS   LONG TERM GOALS: Target date: 03/12/2022   PT to be I in advanced HEP to improve mm strength by one grade to allow improved mobility Baseline:  Goal status: IN PROGRESS  2.  Pt to be able to go up and down 8 steps with one handrail assist  Baseline:  Goal status: IN PROGRESS  3.  Pt to be able to single leg stance on both LE for 20 " to reduce risk of falling.  Baseline:  Goal status: IN PROGRESS     PLAN: PT FREQUENCY: 2x/week  PT DURATION: 4 weeks  PLANNED INTERVENTIONS: Therapeutic exercises, Therapeutic activity, Neuromuscular re-education, Balance training, Gait training, Patient/Family education, Self Care, and Manual therapy  PLAN FOR NEXT  SESSION:  Progress strengthening and balance, gait training.  Progress balance challenges.  Ihor Austin, LPTA/CLT; CBIS [phone]  3:00 PM, 03/19/22

## 2022-03-25 ENCOUNTER — Ambulatory Visit (HOSPITAL_COMMUNITY): Payer: Medicare Other

## 2022-03-25 DIAGNOSIS — R296 Repeated falls: Secondary | ICD-10-CM

## 2022-03-25 DIAGNOSIS — M6281 Muscle weakness (generalized): Secondary | ICD-10-CM

## 2022-03-25 NOTE — Therapy (Signed)
OUTPATIENT PHYSICAL THERAPY LOWER EXTREMITY Treatment     Patient Name: Martin [name] MRN: [id number] DOB:Mar 02, 1956, 66 y.o., male Today's Date: 03/25/2022  END OF SESSION:   PT End of Session - 03/25/22 0907     Visit Number 12    Number of Visits 17    Date for PT Re-Evaluation 04/09/22    Authorization Type UHC medicare    Progress Note Due on Visit 38    PT Start Time 0900    PT Stop Time 0940    PT Time Calculation (min) 40 min    Activity Tolerance Patient tolerated treatment well    Behavior During Therapy [facility] for tasks assessed/performed                   Past Medical History:  Diagnosis Date   (HFpEF) heart failure with preserved ejection fraction (HCC)    CAD (coronary artery disease)    a. s/p PCI to LAD 2001. b. s/p stenting to the RCA and mid LAD December 2011, residual distal RCA disease treated medically. c.  Multivessel disease status post CABG October 2020   Carotid artery disease ([place])    Essential hypertension    GERD (gastroesophageal reflux disease)    Habitual alcohol use    Hyperlipidemia    Low back pain    Myocardial infarction ([place]) 2020   OSA on CPAP    Osteoarthritis    Permanent atrial fibrillation ([place])    a. dx 10/2017   Polysubstance abuse ([place])    History of marijuana and Vicodin abuse   PVC's (premature ventricular contractions)    Seasonal allergies    Stroke [facility]) 2020   Past Surgical History:  Procedure Laterality Date   CARDIOVERSION N/A 11/19/2017   Procedure: CARDIOVERSION;  Surgeon: Herminio Commons, MD;  [facility];  Service: Cardiovascular;  Laterality: N/A;   COLONOSCOPY WITH PROPOFOL N/A 03/01/2020   Procedure: COLONOSCOPY WITH PROPOFOL;  Surgeon: Harvel Quale, MD;  [facility];  Service: Gastroenterology;  Laterality: N/A;  730   CORONARY ANGIOPLASTY WITH STENT PLACEMENT  2011   CORONARY ARTERY BYPASS GRAFT N/A 03/10/2019   Procedure: CORONARY ARTERY BYPASS  GRAFTING (CABG)X 3 Using Left Internal Mammary Artery and Right Saphenous Vein Grafts;  Surgeon: Ivin Poot, MD;  Location: [place];  Service: Open Heart Surgery;  Laterality: N/A;   ESOPHAGOGASTRODUODENOSCOPY (EGD) WITH PROPOFOL N/A 03/01/2020   Procedure: ESOPHAGOGASTRODUODENOSCOPY (EGD) WITH PROPOFOL;  Surgeon: Harvel Quale, MD;  [facility];  Service: Gastroenterology;  Laterality: N/A;   LEFT HEART CATH AND CORONARY ANGIOGRAPHY N/A 03/08/2019   Procedure: LEFT HEART CATH AND CORONARY ANGIOGRAPHY;  Surgeon: Belva Crome, MD;  Location: [place] CV LAB;  Service: Cardiovascular;  Laterality: N/A;   POLYPECTOMY  03/01/2020   Procedure: POLYPECTOMY;  Surgeon: Harvel Quale, MD;  [facility];  Service: Gastroenterology;;   TEE WITHOUT CARDIOVERSION N/A 03/10/2019   Procedure: TRANSESOPHAGEAL ECHOCARDIOGRAM (TEE);  Surgeon: Prescott Gum, Collier Salina, MD;  Location: [place];  Service: Open Heart Surgery;  Laterality: N/A;   TONSILLECTOMY     Patient Active Problem List   Diagnosis Date Noted   Pleural effusion on right 01/26/2022   Gout 05/22/2021   Positive fecal occult blood test 01/25/2020   Anemia 01/25/2020   Left leg weakness 04/26/2019   Generalized anxiety disorder    Peripheral edema    Morbid obesity ([place])    Acute gout of right  ankle    Hypoalbuminemia due to protein-calorie malnutrition (HCC)    Acute blood loss anemia    Chronic diastolic congestive heart failure ([place])    Debility 03/27/2019   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 ([place]) 03/27/2019   Lethargy    Persistent atrial fibrillation ([place])    Cerebral embolism with cerebral infarction 03/13/2019   Hx of CABG 03/10/2019   NSTEMI (non-ST elevated myocardial infarction) ([place]) 03/07/2019   Unstable angina ([place]) 03/07/2019   Anxiety 04/23/2018   Difficulty sleeping 01/06/2018   Seasonal allergies 01/04/2018   OSA on CPAP 05/27/2016   Obesity  06/10/2011   Insomnia 06/10/2011   PVC's (premature ventricular contractions)    CAD (coronary artery disease)    Carotid artery disease ([place])    DEGENERATIVE JOINT DISEASE 07/28/2010   Dyslipidemia 06/17/2010   SUBSTANCE ABUSE, MULTIPLE 06/17/2010   Essential hypertension 06/17/2010   CAD 06/17/2010   BRADYCARDIA 06/17/2010    PCP: Allyn Kenner  REFERRING PROVIDER: Allyn Kenner  REFERRING DIAG:  PT eval/tx for R29.6 repeated falls per Allyn Kenner, MD  THERAPY DIAG:  History of multiple falls  Rationale for Evaluation and Treatment Rehabilitation  ONSET DATE: 07/09/2021  SUBJECTIVE:   SUBJECTIVE STATEMENT: overall doing well; no new falls and no pain today.  Reports he has been doing his exercises on days he doesn't have therapy.  Still has some weakness in the left leg  PERTINENT HISTORY: CVA, PVC, CABG, poly substance abuse  PAIN:  Are you having pain? Yes: NPRS scale: 0/10 Pain location: low back  Pain description: sharp, constant  Aggravating factors: sitting and getting up  Relieving factors: Meds    PRECAUTIONS: None  WEIGHT BEARING RESTRICTIONS No  FALLS:  Has patient fallen in last 6 months? Yes. Number of falls 2  LIVING ENVIRONMENT: Lives with: lives alone Lives in: House/apartment Stairs: Yes: Internal: 2 steps; on right going up Has following equipment at home: None  OCCUPATION: retired   PLOF: [place] not to fall,   OBJECTIVE:   DIAGNOSTIC FINDINGS:  IMPRESSION: 1. Unchanged appearance of subacute right ACA as well as ACA/MCA watershed territory infarcts. No new acute infarction is demonstrated. No intracranial hemorrhage. 2. Stable atrophy and chronic small vessel ischemic disease.     Electronically Signed   By: Kellie Simmering   On: 03/22/2019 17:13      COGNITION:  Overall cognitive status: Within functional limits for tasks assessed       POSTURE: rounded shoulders, forward head, decreased lumbar lordosis,  increased thoracic kyphosis, and flexed trunk    LOWER EXTREMITY MMT:  MMT Right eval Right 03/11/22 Left eval Left 03/11/22  Hip flexion 5/5 5/5 4+/5 5/5  Hip extension 3/5 3+/5 3-/5 3/5  Hip abduction 5/5 5/5 3/5 3+/5  Hip adduction      Hip internal rotation      Hip external rotation      Knee flexion 5/5 5/5 4+ 4+/5  Knee extension 5/5 5/5 5/5 5/5  Ankle dorsiflexion 4/5 5/5 5/5 5/5  Ankle plantarflexion      Ankle inversion      Ankle eversion       (Blank rows = not tested)    FUNCTIONAL TESTS:  30 seconds chair stand test: 10 2 minute walk test: 283 feet no AD (shuffling gait, decreased ground clearance) Single leg stance:  Rt:  2" , Lt:  deferred due to walking shoe   TODAY'S TREATMENT: 03/25/22 [place]  stepper x 5' Sit to stand no UE assist 3 x 5 Figure 8 walking around the cones x 3 cones down and back x 3 Step over cones x 3 down and back x 5 Standing: Heel raises 2 x 10 2# hip abduction 2 x 10 2# hip extension 2 x 10 Tandem stance 3 x 20" each 1 UE assist SLS 3 x 10 sec each with 1 UE assist Toe taps 4" box 2 x 10 each  Seated: 2# LAQ's 2 x 10    03/19/22 Quadruped: attempted hip extension, pt wearing slippery pants Prone: hip extension 10x 5" Toe tapping with opposite UE in arm on 6in step board Tandem stance 2x 30" Squat 15x Sidestep RTB 2RT  STS 10x eccentric control no HHA  03/17/2022 Standing: Hip abduction 2 x 10 each Hip extension 2 x 10 each Slant board 5 x 20" Mini squats 2 x 10 Marching with 2 finger tip assist x 10 Sidestepping blue line 15 ft down and back x 2 Step length to target using cones on blue line (5 cones) x 4 down and back Figure 8 walking down and back x 3 ( 5 cones)  Sit to stand 2 x 10 no UE assist  Step navigation 7" steps up and down x 3 with one handrail  Seated: 3# heel raises x 30  03/11/22 Progress note/recert completed Functional test measures:  2MWT 316 feet (was 283 feet)  30 second chair  test: 14X (was 10X)  Single leg stance:  Rt:2"  (was 2"), Lt: 1" (was deferred/unable)  MMT completed see above chart  Goal review (STG met, 0 LTG) Tandem stance:  5" Rt leading, 10" Lt leading max without UE assist Nustep 5 minutes level 4 seat 9  03/09/22 Nustep 5 min dynamic warmup (seat 9) lv 4  Standing: Heel raises on incline 3 x 10 Toe raises 3 x 10 Slant board 5 x 20" 4" runner step-ups 2x10 Step length to target using cones on blue line x 4 down and back Wide and narrow turns through cones x2 RT Step over cones blue line x 4 down and back Figure 8 steps between 2 cones backwards x3 RT SBA   03/06/22 Nustep 5 min dynamic warmup (seat 9) lv 4  Standing: Heel raises on incline 2 x 10 Toe raises 2 x 10 Slant board 5 x 20" Step length to target using cones on blue line x 4 down and back Step over cones blue line x 2 down and back Figure 8 walking down and back x 4              03/05/22            Standing:            Forefoot raise x 10            Slant board stretch 3 x 30"            Wall arch x 10            Back against wall B shoulder flex without arching x 10  w/2#                          Marching x 10 with one hand hold            PWR: up, rock, twist  and Step            [employer]  hitting far away and up far away and down             Sit to stand x 15             Walk to the back hall in as few as steps as possible goal of 22              Side stepping with big arms x 2 RT                             PATIENT EDUCATION:  Education details: HEP Person educated: Patient Education method: Explanation Education comprehension: returned demonstration   HOME EXERCISE PROGRAM: [place] 03/06/22 gastroc stretch on wall 02/19/22 - Heel Raises with Counter Support  - 2 x daily - 7 x weekly - 2 sets - 10 reps - Toe Raises with Counter Support  - 2 x daily - 7 x weekly - 2 sets - 10 reps - Sit to Stand with Counter Support  - 2 x daily - 7 x weekly - 2 sets -  10 reps - Standing Hip Abduction with Counter Support  - 2 x daily - 7 x weekly - 2 sets - 10 reps - Standing Hip Extension with Counter Support  - 2 x daily - 7 x weekly - 2 sets - 10 reps  02/18/2023: [id number]:              Sitting scapular retraction x 10              Supine active hamstring stretch 30" x 3  Evaluation:  Side lying hip abduction  ASSESSMENT:  CLINICAL IMPRESSION:   Today's session continued to focus on lower extremity strengthening and balance. Patient still with trouble with balance activities; needing upper extremity assist most of the time to avoid losing his balance.  Patient continues to need cues to lengthen his steps with ambulation.  Needs CGA for safety with all standing exercises.  Patient will benefit from continued skilled therapy services to address deficits and promote return to optimal function.      OBJECTIVE IMPAIRMENTS decreased activity tolerance, decreased balance, difficulty walking, and decreased strength.   ACTIVITY LIMITATIONS carrying, lifting, and locomotion level  PARTICIPATION LIMITATIONS: community activity  PERSONAL FACTORS Age, Time since onset of injury/illness/exacerbation, and 3+ comorbidities: CVA, brady cardio, substance abuse, CABG  are also affecting patient's functional outcome.   REHAB POTENTIAL: Good  CLINICAL DECISION MAKING: Evolving/moderate complexity  EVALUATION COMPLEXITY: Moderate   GOALS: Goals reviewed with patient? Yes  SHORT TERM GOALS: Target date: 02/26/2022  PT to be I in HEP to decrease risk of falling  Baseline: Goal status: MET  2.  Pt mm strength to improve 1/2 grade to allow pt to come sit to stand 12  in 30 seconds to demonstrate improved power.   Baseline:  Goal status: MET  3.  Pt to be able to single leg stance on both LE for 10 seconds to reduce risk of falling  Baseline:  Goal status: IN PROGRESS   LONG TERM GOALS: Target date: 03/12/2022   PT to be I in advanced HEP to improve mm  strength by one grade to allow improved mobility Baseline:  Goal status: IN PROGRESS  2.  Pt to be able to go up and down 8 steps with one handrail assist  Baseline:  Goal status: IN PROGRESS  3.  Pt to be able to single leg  stance on both LE for 20 " to reduce risk of falling.  Baseline:  Goal status: IN PROGRESS     PLAN: PT FREQUENCY: 2x/week  PT DURATION: 4 weeks  PLANNED INTERVENTIONS: Therapeutic exercises, Therapeutic activity, Neuromuscular re-education, Balance training, Gait training, Patient/Family education, Self Care, and Manual therapy  PLAN FOR NEXT SESSION:  Progress strengthening and balance, gait training.  Progress balance challenges.  9:42 AM, 03/25/22 [place] Rudy Small Magalene Mclear MPT [place] [facility] [place] [phone]

## 2022-03-26 ENCOUNTER — Ambulatory Visit (HOSPITAL_COMMUNITY): Payer: Medicare Other | Admitting: Physical Therapy

## 2022-03-26 DIAGNOSIS — R296 Repeated falls: Secondary | ICD-10-CM

## 2022-03-26 DIAGNOSIS — M722 Plantar fascial fibromatosis: Secondary | ICD-10-CM | POA: Diagnosis not present

## 2022-03-26 DIAGNOSIS — M79671 Pain in right foot: Secondary | ICD-10-CM | POA: Diagnosis not present

## 2022-03-26 DIAGNOSIS — M6281 Muscle weakness (generalized): Secondary | ICD-10-CM

## 2022-03-26 DIAGNOSIS — M79672 Pain in left foot: Secondary | ICD-10-CM | POA: Diagnosis not present

## 2022-03-26 NOTE — Therapy (Signed)
OUTPATIENT PHYSICAL THERAPY LOWER EXTREMITY Treatment     Patient Name: Martin [name] MRN: [id number] DOB:08-25-1955, 66 y.o., male Today's Date: 03/26/2022  END OF SESSION:   PT End of Session - 03/26/22 0818     Visit Number 13    Number of Visits 17    Date for PT Re-Evaluation 04/09/22    Authorization Type [employer] medicare    Progress Note Due on Visit 43    PT Start Time 0817    PT Stop Time 0902    PT Time Calculation (min) 45 min    Activity Tolerance Patient tolerated treatment well    Behavior During Therapy [facility] for tasks assessed/performed                   Past Medical History:  Diagnosis Date   (HFpEF) heart failure with preserved ejection fraction (HCC)    CAD (coronary artery disease)    a. s/p PCI to LAD 2001. b. s/p stenting to the RCA and mid LAD December 2011, residual distal RCA disease treated medically. c.  Multivessel disease status post CABG October 2020   Carotid artery disease ([place])    Essential hypertension    GERD (gastroesophageal reflux disease)    Habitual alcohol use    Hyperlipidemia    Low back pain    Myocardial infarction ([place]) 2020   OSA on CPAP    Osteoarthritis    Permanent atrial fibrillation ([place])    a. dx 10/2017   Polysubstance abuse ([place])    History of marijuana and Vicodin abuse   PVC's (premature ventricular contractions)    Seasonal allergies    Stroke [facility]) 2020   Past Surgical History:  Procedure Laterality Date   CARDIOVERSION N/A 11/19/2017   Procedure: CARDIOVERSION;  Surgeon: Herminio Commons, MD;  [facility];  Service: Cardiovascular;  Laterality: N/A;   COLONOSCOPY WITH PROPOFOL N/A 03/01/2020   Procedure: COLONOSCOPY WITH PROPOFOL;  Surgeon: Harvel Quale, MD;  [facility];  Service: Gastroenterology;  Laterality: N/A;  730   CORONARY ANGIOPLASTY WITH STENT PLACEMENT  2011   CORONARY ARTERY BYPASS GRAFT N/A 03/10/2019   Procedure: CORONARY ARTERY BYPASS  GRAFTING (CABG)X 3 Using Left Internal Mammary Artery and Right Saphenous Vein Grafts;  Surgeon: Ivin Poot, MD;  Location: [place];  Service: Open Heart Surgery;  Laterality: N/A;   ESOPHAGOGASTRODUODENOSCOPY (EGD) WITH PROPOFOL N/A 03/01/2020   Procedure: ESOPHAGOGASTRODUODENOSCOPY (EGD) WITH PROPOFOL;  Surgeon: Harvel Quale, MD;  [facility];  Service: Gastroenterology;  Laterality: N/A;   LEFT HEART CATH AND CORONARY ANGIOGRAPHY N/A 03/08/2019   Procedure: LEFT HEART CATH AND CORONARY ANGIOGRAPHY;  Surgeon: Belva Crome, MD;  Location: [place] [facility];  Service: Cardiovascular;  Laterality: N/A;   POLYPECTOMY  03/01/2020   Procedure: POLYPECTOMY;  Surgeon: Harvel Quale, MD;  [facility];  Service: Gastroenterology;;   TEE WITHOUT CARDIOVERSION N/A 03/10/2019   Procedure: TRANSESOPHAGEAL ECHOCARDIOGRAM (TEE);  Surgeon: Prescott Gum, Collier Salina, MD;  Location: [place];  Service: Open Heart Surgery;  Laterality: N/A;   TONSILLECTOMY     Patient Active Problem List   Diagnosis Date Noted   Pleural effusion on right 01/26/2022   Gout 05/22/2021   Positive fecal occult blood test 01/25/2020   Anemia 01/25/2020   Left leg weakness 04/26/2019   Generalized anxiety disorder    Peripheral edema    Morbid obesity ([place])    Acute gout of right  ankle    Hypoalbuminemia due to protein-calorie malnutrition (HCC)    Acute blood loss anemia    Chronic diastolic congestive heart failure ([place])    Debility 03/27/2019   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 ([place]) 03/27/2019   Lethargy    Persistent atrial fibrillation ([place])    Cerebral embolism with cerebral infarction 03/13/2019   Hx of CABG 03/10/2019   NSTEMI (non-ST elevated myocardial infarction) ([place]) 03/07/2019   Unstable angina ([place]) 03/07/2019   Anxiety 04/23/2018   Difficulty sleeping 01/06/2018   Seasonal allergies 01/04/2018   OSA on CPAP 05/27/2016   Obesity  06/10/2011   Insomnia 06/10/2011   PVC's (premature ventricular contractions)    CAD (coronary artery disease)    Carotid artery disease ([place])    DEGENERATIVE JOINT DISEASE 07/28/2010   Dyslipidemia 06/17/2010   SUBSTANCE ABUSE, MULTIPLE 06/17/2010   Essential hypertension 06/17/2010   CAD 06/17/2010   BRADYCARDIA 06/17/2010    PCP: Allyn Kenner  REFERRING PROVIDER: Allyn Kenner  REFERRING DIAG:  PT eval/tx for R29.6 repeated falls per Allyn Kenner, MD  THERAPY DIAG:  History of multiple falls  Rationale for Evaluation and Treatment Rehabilitation  ONSET DATE: 07/09/2021  SUBJECTIVE:   SUBJECTIVE STATEMENT:  Pt reports he is tired.  States he just had yesterday, however his cardiac rehab was cancelled due to a ceiling leak. No new falls and no pain today.  left leg still weak.  PERTINENT HISTORY: CVA, PVC, CABG, poly substance abuse  PAIN:  Are you having pain? Yes: NPRS scale: 0/10 Pain location: low back  Pain description: sharp, constant  Aggravating factors: sitting and getting up  Relieving factors: Meds    PRECAUTIONS: None  WEIGHT BEARING RESTRICTIONS No  FALLS:  Has patient fallen in last 6 months? Yes. Number of falls 2  LIVING ENVIRONMENT: Lives with: lives alone Lives in: House/apartment Stairs: Yes: Internal: 2 steps; on right going up Has following equipment at home: None  OCCUPATION: retired   PLOF: [place] not to fall,   OBJECTIVE:   DIAGNOSTIC FINDINGS:  IMPRESSION: 1. Unchanged appearance of subacute right ACA as well as ACA/MCA watershed territory infarcts. No new acute infarction is demonstrated. No intracranial hemorrhage. 2. Stable atrophy and chronic small vessel ischemic disease.     Electronically Signed   By: Kellie Simmering   On: 03/22/2019 17:13      COGNITION:  Overall cognitive status: Within functional limits for tasks assessed       POSTURE: rounded shoulders, forward head, decreased lumbar lordosis,  increased thoracic kyphosis, and flexed trunk    LOWER EXTREMITY MMT:  MMT Right eval Right 03/11/22 Left eval Left 03/11/22  Hip flexion 5/5 5/5 4+/5 5/5  Hip extension 3/5 3+/5 3-/5 3/5  Hip abduction 5/5 5/5 3/5 3+/5  Hip adduction      Hip internal rotation      Hip external rotation      Knee flexion 5/5 5/5 4+ 4+/5  Knee extension 5/5 5/5 5/5 5/5  Ankle dorsiflexion 4/5 5/5 5/5 5/5  Ankle plantarflexion      Ankle inversion      Ankle eversion       (Blank rows = not tested)    FUNCTIONAL TESTS:  30 seconds chair stand test: 10 2 minute walk test: 283 feet no AD (shuffling gait, decreased ground clearance) Single leg stance:  Rt:  2" , Lt:  deferred due to walking shoe   TODAY'S TREATMENT:  03/26/22 Sit to stand no UE 3X5 Standing:  Heelraises 20X  Hip abduction 2# 2X10  Hip Extension 2# 2X10  Tandem stance 3X20" each with intermittent HHA  SLR 3X10" each with 1 UE assist  Vectors 5X3" with bil UE assist  Toe taps 4" box 2X10 with 1 UE assist  Side stepping in hallway 3Rt 20' each way (120' total)  03/25/22 Octane stepper x 5' Sit to stand no UE assist 3 x 5 Figure 8 walking around the cones x 3 cones down and back x 3 Step over cones x 3 down and back x 5 Standing: Heel raises 2 x 10 2# hip abduction 2 x 10 2# hip extension 2 x 10 Tandem stance 3 x 20" each 1 UE assist SLS 3 x 10 sec each with 1 UE assist Toe taps 4" box 2 x 10 each  Seated: 2# LAQ's 2 x 10    03/19/22 Quadruped: attempted hip extension, pt wearing slippery pants Prone: hip extension 10x 5" Toe tapping with opposite UE in arm on 6in step board Tandem stance 2x 30" Squat 15x Sidestep RTB 2RT  STS 10x eccentric control no HHA  03/17/2022 Standing: Hip abduction 2 x 10 each Hip extension 2 x 10 each Slant board 5 x 20" Mini squats 2 x 10 Marching with 2 finger tip assist x 10 Sidestepping blue line 15 ft down and back x 2 Step length to target using cones on blue line  (5 cones) x 4 down and back Figure 8 walking down and back x 3 ( 5 cones)  Sit to stand 2 x 10 no UE assist  Step navigation 7" steps up and down x 3 with one handrail  Seated: 3# heel raises x 30  03/11/22 Progress note/recert completed Functional test measures:  2MWT 316 feet (was 283 feet)  30 second chair test: 14X (was 10X)  Single leg stance:  Rt:2"  (was 2"), Lt: 1" (was deferred/unable)  MMT completed see above chart  Goal review (STG met, 0 LTG) Tandem stance:  5" Rt leading, 10" Lt leading max without UE assist Nustep 5 minutes level 4 seat 9  03/09/22 Nustep 5 min dynamic warmup (seat 9) lv 4  Standing: Heel raises on incline 3 x 10 Toe raises 3 x 10 Slant board 5 x 20" 4" runner step-ups 2x10 Step length to target using cones on blue line x 4 down and back Wide and narrow turns through cones x2 RT Step over cones blue line x 4 down and back Figure 8 steps between 2 cones backwards x3 RT SBA   03/06/22 Nustep 5 min dynamic warmup (seat 9) lv 4  Standing: Heel raises on incline 2 x 10 Toe raises 2 x 10 Slant board 5 x 20" Step length to target using cones on blue line x 4 down and back Step over cones blue line x 2 down and back Figure 8 walking down and back x 4              03/05/22            Standing:            Forefoot raise x 10            Slant board stretch 3 x 30"            Wall arch x 10            Back against wall B shoulder flex without  arching x 10  w/2#                          Marching x 10 with one hand hold            PWR: up, rock, twist  and Step            [employer] hitting far away and up far away and down             Sit to stand x 15             Walk to the back hall in as few as steps as possible goal of 22              Side stepping with big arms x 2 RT                             PATIENT EDUCATION:  Education details: HEP Person educated: Patient Education method: Explanation Education comprehension: returned  demonstration   HOME EXERCISE PROGRAM: 03/26/22:  vectors  [id number] 03/06/22 gastroc stretch on wall 02/19/22 - Heel Raises with Counter Support  - 2 x daily - 7 x weekly - 2 sets - 10 reps - Toe Raises with Counter Support  - 2 x daily - 7 x weekly - 2 sets - 10 reps - Sit to Stand with Counter Support  - 2 x daily - 7 x weekly - 2 sets - 10 reps - Standing Hip Abduction with Counter Support  - 2 x daily - 7 x weekly - 2 sets - 10 reps - Standing Hip Extension with Counter Support  - 2 x daily - 7 x weekly - 2 sets - 10 reps  02/18/2023: [id number]:              Sitting scapular retraction x 10              Supine active hamstring stretch 30" x 3  Evaluation:  Side lying hip abduction  ASSESSMENT:  CLINICAL IMPRESSION:   Continued focus on lower extremity strengthening and balance. Patient requires frequent cues as tends to rush through activities, difficulty activating abductors with targeted exercises and lifting Lt fully with step tap activity.  Balance activities remain most challenging.  Added vector activity to challenge balance today.  Patient continues to need cues to lengthen his steps with ambulation.  Needs CGA for safety with all standing exercises.  Patient will benefit from continued skilled therapy services to address deficits and promote return to optimal function.      OBJECTIVE IMPAIRMENTS decreased activity tolerance, decreased balance, difficulty walking, and decreased strength.   ACTIVITY LIMITATIONS carrying, lifting, and locomotion level  PARTICIPATION LIMITATIONS: community activity  PERSONAL FACTORS Age, Time since onset of injury/illness/exacerbation, and 3+ comorbidities: CVA, brady cardio, substance abuse, CABG  are also affecting patient's functional outcome.   REHAB POTENTIAL: Good  CLINICAL DECISION MAKING: Evolving/moderate complexity  EVALUATION COMPLEXITY: Moderate   GOALS: Goals reviewed with patient? Yes  SHORT TERM GOALS: Target date:  02/26/2022  PT to be I in HEP to decrease risk of falling  Baseline: Goal status: MET  2.  Pt mm strength to improve 1/2 grade to allow pt to come sit to stand 12  in 30 seconds to demonstrate improved power.   Baseline:  Goal status: MET  3.  Pt to be able to single leg stance  on both LE for 10 seconds to reduce risk of falling  Baseline:  Goal status: IN PROGRESS   LONG TERM GOALS: Target date: 03/12/2022   PT to be I in advanced HEP to improve mm strength by one grade to allow improved mobility Baseline:  Goal status: IN PROGRESS  2.  Pt to be able to go up and down 8 steps with one handrail assist  Baseline:  Goal status: IN PROGRESS  3.  Pt to be able to single leg stance on both LE for 20 " to reduce risk of falling.  Baseline:  Goal status: IN PROGRESS     PLAN: PT FREQUENCY: 2x/week  PT DURATION: 4 weeks  PLANNED INTERVENTIONS: Therapeutic exercises, Therapeutic activity, Neuromuscular re-education, Balance training, Gait training, Patient/Family education, Self Care, and Manual therapy  PLAN FOR NEXT SESSION:  Progress strengthening and balance, gait training.  Progress balance challenges.  9:40 AM, 03/26/22 Teena Irani, PTA/CLT [place] Ph: [phone]

## 2022-03-27 DIAGNOSIS — R070 Pain in throat: Secondary | ICD-10-CM | POA: Diagnosis not present

## 2022-04-01 ENCOUNTER — Encounter (HOSPITAL_COMMUNITY): Payer: Medicare Other | Admitting: Physical Therapy

## 2022-04-01 DIAGNOSIS — M10061 Idiopathic gout, right knee: Secondary | ICD-10-CM | POA: Diagnosis not present

## 2022-04-02 ENCOUNTER — Encounter (HOSPITAL_COMMUNITY): Payer: Medicare Other | Admitting: Physical Therapy

## 2022-04-02 ENCOUNTER — Encounter (HOSPITAL_COMMUNITY): Payer: Medicare Other

## 2022-04-07 ENCOUNTER — Ambulatory Visit (HOSPITAL_COMMUNITY): Payer: Medicare Other | Admitting: Physical Therapy

## 2022-04-07 DIAGNOSIS — R296 Repeated falls: Secondary | ICD-10-CM | POA: Diagnosis not present

## 2022-04-07 DIAGNOSIS — M6281 Muscle weakness (generalized): Secondary | ICD-10-CM

## 2022-04-07 NOTE — Therapy (Signed)
OUTPATIENT PHYSICAL THERAPY LOWER EXTREMITY Treatment     Patient Name: Martin [name] MRN: [id number] DOB:08-09-1955, 66 y.o., male Today's Date: 04/07/2022  END OF SESSION:   PT End of Session - 04/07/22 0906     Visit Number 14    Number of Visits 17    Date for PT Re-Evaluation 04/09/22    Authorization Type [employer] medicare    Progress Note Due on Visit 52    PT Start Time 0902    PT Stop Time 0944    PT Time Calculation (min) 42 min    Activity Tolerance Patient tolerated treatment well    Behavior During Therapy [facility] for tasks assessed/performed                   Past Medical History:  Diagnosis Date   (HFpEF) heart failure with preserved ejection fraction (HCC)    CAD (coronary artery disease)    a. s/p PCI to LAD 2001. b. s/p stenting to the RCA and mid LAD December 2011, residual distal RCA disease treated medically. c.  Multivessel disease status post CABG October 2020   Carotid artery disease ([place])    Essential hypertension    GERD (gastroesophageal reflux disease)    Habitual alcohol use    Hyperlipidemia    Low back pain    Myocardial infarction ([place]) 2020   OSA on CPAP    Osteoarthritis    Permanent atrial fibrillation ([place])    a. dx 10/2017   Polysubstance abuse ([place])    History of marijuana and Vicodin abuse   PVC's (premature ventricular contractions)    Seasonal allergies    Stroke [facility]) 2020   Past Surgical History:  Procedure Laterality Date   CARDIOVERSION N/A 11/19/2017   Procedure: CARDIOVERSION;  Surgeon: Herminio Commons, MD;  [facility];  Service: Cardiovascular;  Laterality: N/A;   COLONOSCOPY WITH PROPOFOL N/A 03/01/2020   Procedure: COLONOSCOPY WITH PROPOFOL;  Surgeon: Harvel Quale, MD;  [facility];  Service: Gastroenterology;  Laterality: N/A;  730   CORONARY ANGIOPLASTY WITH STENT PLACEMENT  2011   CORONARY ARTERY BYPASS GRAFT N/A 03/10/2019   Procedure: CORONARY ARTERY BYPASS  GRAFTING (CABG)X 3 Using Left Internal Mammary Artery and Right Saphenous Vein Grafts;  Surgeon: Ivin Poot, MD;  Location: [place];  Service: Open Heart Surgery;  Laterality: N/A;   ESOPHAGOGASTRODUODENOSCOPY (EGD) WITH PROPOFOL N/A 03/01/2020   Procedure: ESOPHAGOGASTRODUODENOSCOPY (EGD) WITH PROPOFOL;  Surgeon: Harvel Quale, MD;  [facility];  Service: Gastroenterology;  Laterality: N/A;   LEFT HEART CATH AND CORONARY ANGIOGRAPHY N/A 03/08/2019   Procedure: LEFT HEART CATH AND CORONARY ANGIOGRAPHY;  Surgeon: Belva Crome, MD;  Location: [place] [facility];  Service: Cardiovascular;  Laterality: N/A;   POLYPECTOMY  03/01/2020   Procedure: POLYPECTOMY;  Surgeon: Harvel Quale, MD;  [facility];  Service: Gastroenterology;;   TEE WITHOUT CARDIOVERSION N/A 03/10/2019   Procedure: TRANSESOPHAGEAL ECHOCARDIOGRAM (TEE);  Surgeon: Prescott Gum, Collier Salina, MD;  Location: [place];  Service: Open Heart Surgery;  Laterality: N/A;   TONSILLECTOMY     Patient Active Problem List   Diagnosis Date Noted   Pleural effusion on right 01/26/2022   Gout 05/22/2021   Positive fecal occult blood test 01/25/2020   Anemia 01/25/2020   Left leg weakness 04/26/2019   Generalized anxiety disorder    Peripheral edema    Morbid obesity ([place])    Acute gout of right  ankle    Hypoalbuminemia due to protein-calorie malnutrition (HCC)    Acute blood loss anemia    Chronic diastolic congestive heart failure ([place])    Debility 03/27/2019   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 ([place]) 03/27/2019   Lethargy    Persistent atrial fibrillation ([place])    Cerebral embolism with cerebral infarction 03/13/2019   Hx of CABG 03/10/2019   NSTEMI (non-ST elevated myocardial infarction) ([place]) 03/07/2019   Unstable angina ([place]) 03/07/2019   Anxiety 04/23/2018   Difficulty sleeping 01/06/2018   Seasonal allergies 01/04/2018   OSA on CPAP 05/27/2016   Obesity  06/10/2011   Insomnia 06/10/2011   PVC's (premature ventricular contractions)    CAD (coronary artery disease)    Carotid artery disease ([place])    DEGENERATIVE JOINT DISEASE 07/28/2010   Dyslipidemia 06/17/2010   SUBSTANCE ABUSE, MULTIPLE 06/17/2010   Essential hypertension 06/17/2010   CAD 06/17/2010   BRADYCARDIA 06/17/2010    PCP: Allyn Kenner  REFERRING PROVIDER: Allyn Kenner  REFERRING DIAG:  PT eval/tx for R29.6 repeated falls per Allyn Kenner, MD  THERAPY DIAG:  History of multiple falls  Rationale for Evaluation and Treatment Rehabilitation  ONSET DATE: 07/09/2021  SUBJECTIVE:   SUBJECTIVE STATEMENT:  Pt states he had a gout flair up in his Rt knee and that's why he did not come last week.  States it is better today.  States his balance is most off . Reports he went to watch his granddaughter play ball at the beach over the weekend and admits to not doing his exercises as he should.  PERTINENT HISTORY: CVA, PVC, CABG, poly substance abuse  PAIN:  Are you having pain? Yes: NPRS scale: 0/10 Pain location: low back  Pain description: sharp, constant  Aggravating factors: sitting and getting up  Relieving factors: Meds    PRECAUTIONS: None  WEIGHT BEARING RESTRICTIONS No  FALLS:  Has patient fallen in last 6 months? Yes. Number of falls 2  LIVING ENVIRONMENT: Lives with: lives alone Lives in: House/apartment Stairs: Yes: Internal: 2 steps; on right going up Has following equipment at home: None  OCCUPATION: retired   PLOF: [place] not to fall,   OBJECTIVE:   DIAGNOSTIC FINDINGS:  IMPRESSION: 1. Unchanged appearance of subacute right ACA as well as ACA/MCA watershed territory infarcts. No new acute infarction is demonstrated. No intracranial hemorrhage. 2. Stable atrophy and chronic small vessel ischemic disease.     Electronically Signed   By: Kellie Simmering   On: 03/22/2019 17:13      COGNITION:  Overall cognitive status: Within  functional limits for tasks assessed       POSTURE: rounded shoulders, forward head, decreased lumbar lordosis, increased thoracic kyphosis, and flexed trunk    LOWER EXTREMITY MMT:  MMT Right eval Right 03/11/22 Left eval Left 03/11/22  Hip flexion 5/5 5/5 4+/5 5/5  Hip extension 3/5 3+/5 3-/5 3/5  Hip abduction 5/5 5/5 3/5 3+/5  Hip adduction      Hip internal rotation      Hip external rotation      Knee flexion 5/5 5/5 4+ 4+/5  Knee extension 5/5 5/5 5/5 5/5  Ankle dorsiflexion 4/5 5/5 5/5 5/5  Ankle plantarflexion      Ankle inversion      Ankle eversion       (Blank rows = not tested)    FUNCTIONAL TESTS:  30 seconds chair stand test: 10 2 minute walk test: 283 feet  no AD (shuffling gait, decreased ground clearance) Single leg stance:  Rt:  2" , Lt:  deferred due to walking shoe   TODAY'S TREATMENT:  04/07/22 Ambulation working on slower larger steps, UE swing in hallway 150 feet Sit to stand no UE 3X5 Standing:  Heelraises 20X  Hip abduction 2# 2X10  Hip Extension 2# 2X10  4" lateral step downs (heel first) 2X10 each  Tandem stance 3X20" each with intermittent HHA  SLR 3X10" each with 1 UE assist  Vectors 5X3" with bil UE assist  Toe taps 4" box 2X10 with 1 UE assist   03/26/22 Sit to stand no UE 3X5 Standing:  Heelraises 20X  Hip abduction 2# 2X10  Hip Extension 2# 2X10  Tandem stance 3X20" each with intermittent HHA  SLR 3X10" each with 1 UE assist  Vectors 5X3" with bil UE assist  Toe taps 4" box 2X10 with 1 UE assist  Side stepping in hallway 3Rt 20' each way (120' total)  03/25/22 Octane stepper x 5' Sit to stand no UE assist 3 x 5 Figure 8 walking around the cones x 3 cones down and back x 3 Step over cones x 3 down and back x 5 Standing: Heel raises 2 x 10 2# hip abduction 2 x 10 2# hip extension 2 x 10 Tandem stance 3 x 20" each 1 UE assist SLS 3 x 10 sec each with 1 UE assist Toe taps 4" box 2 x 10 each  Seated: 2# LAQ's 2 x  10  03/19/22 Quadruped: attempted hip extension, pt wearing slippery pants Prone: hip extension 10x 5" Toe tapping with opposite UE in arm on 6in step board Tandem stance 2x 30" Squat 15x Sidestep RTB 2RT  STS 10x eccentric control no HHA  03/17/2022 Standing: Hip abduction 2 x 10 each Hip extension 2 x 10 each Slant board 5 x 20" Mini squats 2 x 10 Marching with 2 finger tip assist x 10 Sidestepping blue line 15 ft down and back x 2 Step length to target using cones on blue line (5 cones) x 4 down and back Figure 8 walking down and back x 3 ( 5 cones)  Sit to stand 2 x 10 no UE assist  Step navigation 7" steps up and down x 3 with one handrail  Seated: 3# heel raises x 30  03/11/22 Progress note/recert completed Functional test measures:  2MWT 316 feet (was 283 feet)  30 second chair test: 14X (was 10X)  Single leg stance:  Rt:2"  (was 2"), Lt: 1" (was deferred/unable)  MMT completed see above chart  Goal review (STG met, 0 LTG) Tandem stance:  5" Rt leading, 10" Lt leading max without UE assist Nustep 5 minutes level 4 seat 9  03/09/22 Nustep 5 min dynamic warmup (seat 9) lv 4  Standing: Heel raises on incline 3 x 10 Toe raises 3 x 10 Slant board 5 x 20" 4" runner step-ups 2x10 Step length to target using cones on blue line x 4 down and back Wide and narrow turns through cones x2 RT Step over cones blue line x 4 down and back Figure 8 steps between 2 cones backwards x3 RT SBA   03/06/22 Nustep 5 min dynamic warmup (seat 9) lv 4  Standing: Heel raises on incline 2 x 10 Toe raises 2 x 10 Slant board 5 x 20" Step length to target using cones on blue line x 4 down and back Step over cones blue line x 2  down and back Figure 8 walking down and back x 4              03/05/22            Standing:            Forefoot raise x 10            Slant board stretch 3 x 30"            Wall arch x 10            Back against wall B shoulder flex without arching x  10  w/2#                          Marching x 10 with one hand hold            PWR: up, rock, twist  and Step            [employer] hitting far away and up far away and down             Sit to stand x 15             Walk to the back hall in as few as steps as possible goal of 22              Side stepping with big arms x 2 RT                             PATIENT EDUCATION:  Education details: HEP Person educated: Patient Education method: Explanation Education comprehension: returned demonstration   HOME EXERCISE PROGRAM: 03/26/22:  vectors  [id number] 03/06/22 gastroc stretch on wall 02/19/22 - Heel Raises with Counter Support  - 2 x daily - 7 x weekly - 2 sets - 10 reps - Toe Raises with Counter Support  - 2 x daily - 7 x weekly - 2 sets - 10 reps - Sit to Stand with Counter Support  - 2 x daily - 7 x weekly - 2 sets - 10 reps - Standing Hip Abduction with Counter Support  - 2 x daily - 7 x weekly - 2 sets - 10 reps - Standing Hip Extension with Counter Support  - 2 x daily - 7 x weekly - 2 sets - 10 reps  02/18/2023: [id number]:              Sitting scapular retraction x 10              Supine active hamstring stretch 30" x 3  Evaluation:  Side lying hip abduction  ASSESSMENT:  CLINICAL IMPRESSION:   Resumed therex this session.  Began with working on gait as tends to take short, choppy steps.  Cues to increase stride and arm swing.  Continued focus on lower extremity strengthening and balance. Patient requires frequent cues as tends to rush through activities.  Balance activities remain most challenging.  Required SBA for safety with all standing exercises this session.  Patient will benefit from continued skilled therapy services to address deficits and promote return to optimal function.      OBJECTIVE IMPAIRMENTS decreased activity tolerance, decreased balance, difficulty walking, and decreased strength.   ACTIVITY LIMITATIONS carrying, lifting, and locomotion  level  PARTICIPATION LIMITATIONS: community activity  PERSONAL FACTORS Age, Time since onset of injury/illness/exacerbation, and 3+ comorbidities: CVA, brady cardio, substance abuse, CABG  are also  affecting patient's functional outcome.   REHAB POTENTIAL: Good  CLINICAL DECISION MAKING: Evolving/moderate complexity  EVALUATION COMPLEXITY: Moderate   GOALS: Goals reviewed with patient? Yes  SHORT TERM GOALS: Target date: 02/26/2022  PT to be I in HEP to decrease risk of falling  Baseline: Goal status: MET  2.  Pt mm strength to improve 1/2 grade to allow pt to come sit to stand 12  in 30 seconds to demonstrate improved power.   Baseline:  Goal status: MET  3.  Pt to be able to single leg stance on both LE for 10 seconds to reduce risk of falling  Baseline:  Goal status: IN PROGRESS   LONG TERM GOALS: Target date: 03/12/2022   PT to be I in advanced HEP to improve mm strength by one grade to allow improved mobility Baseline:  Goal status: IN PROGRESS  2.  Pt to be able to go up and down 8 steps with one handrail assist  Baseline:  Goal status: IN PROGRESS  3.  Pt to be able to single leg stance on both LE for 20 " to reduce risk of falling.  Baseline:  Goal status: IN PROGRESS     PLAN: PT FREQUENCY: 2x/week  PT DURATION: 4 weeks  PLANNED INTERVENTIONS: Therapeutic exercises, Therapeutic activity, Neuromuscular re-education, Balance training, Gait training, Patient/Family education, Self Care, and Manual therapy  PLAN FOR NEXT SESSION:  Progress strengthening and balance, gait training.  Progress balance challenges.Re-evaluate next session.  9:07 AM, 04/07/22 Teena Irani, PTA/CLT [place] Ph: [phone]

## 2022-04-09 ENCOUNTER — Ambulatory Visit (HOSPITAL_COMMUNITY): Payer: Medicare Other | Attending: Internal Medicine | Admitting: Physical Therapy

## 2022-04-09 DIAGNOSIS — R296 Repeated falls: Secondary | ICD-10-CM | POA: Diagnosis not present

## 2022-04-09 DIAGNOSIS — M6281 Muscle weakness (generalized): Secondary | ICD-10-CM | POA: Insufficient documentation

## 2022-04-09 NOTE — Therapy (Addendum)
OUTPATIENT PHYSICAL THERAPY LOWER EXTREMITY Treatment   PHYSICAL THERAPY DISCHARGE SUMMARY  Visits from Start of Care: 14  Current functional level related to goals / functional outcomes: Pt is pleased with progress and has met all of his goals except single leg stance which he is making progess .   Remaining deficits: Continues to ambulate with shorten strides    Education / Equipment: HEP   Patient agrees to discharge. Patient goals were met. Patient is being discharged due to being pleased with the current functional level.   Patient Name: Martin [name] MRN: [id number] DOB:1956/02/06, 66 y.o., male Today's Date: 04/07/2022  END OF SESSION:   PT End of Session - 04/07/22 0906     Visit Number 14    Number of Visits 14   Date for PT Re-Evaluation 04/09/22    Authorization Type [employer] medicare    Progress Note Due on Visit 41    PT Start Time 0902    PT Stop Time 0944    PT Time Calculation (min) 42 min    Activity Tolerance Patient tolerated treatment well    Behavior During Therapy [facility] for tasks assessed/performed                   Past Medical History:  Diagnosis Date   (HFpEF) heart failure with preserved ejection fraction (HCC)    CAD (coronary artery disease)    a. s/p PCI to LAD 2001. b. s/p stenting to the RCA and mid LAD December 2011, residual distal RCA disease treated medically. c.  Multivessel disease status post CABG October 2020   Carotid artery disease ([place])    Essential hypertension    GERD (gastroesophageal reflux disease)    Habitual alcohol use    Hyperlipidemia    Low back pain    Myocardial infarction ([place]) 2020   OSA on CPAP    Osteoarthritis    Permanent atrial fibrillation ([place])    a. dx 10/2017   Polysubstance abuse ([place])    History of marijuana and Vicodin abuse   PVC's (premature ventricular contractions)    Seasonal allergies    Stroke [facility]) 2020   Past Surgical History:  Procedure Laterality Date   CARDIOVERSION N/A  11/19/2017   Procedure: CARDIOVERSION;  Surgeon: Herminio Commons, MD;  [facility];  Service: Cardiovascular;  Laterality: N/A;   COLONOSCOPY WITH PROPOFOL N/A 03/01/2020   Procedure: COLONOSCOPY WITH PROPOFOL;  Surgeon: Harvel Quale, MD;  [facility];  Service: Gastroenterology;  Laterality: N/A;  730   CORONARY ANGIOPLASTY WITH STENT PLACEMENT  2011   CORONARY ARTERY BYPASS GRAFT N/A 03/10/2019   Procedure: CORONARY ARTERY BYPASS GRAFTING (CABG)X 3 Using Left Internal Mammary Artery and Right Saphenous Vein Grafts;  Surgeon: Ivin Poot, MD;  Location: [place];  Service: Open Heart Surgery;  Laterality: N/A;   ESOPHAGOGASTRODUODENOSCOPY (EGD) WITH PROPOFOL N/A 03/01/2020   Procedure: ESOPHAGOGASTRODUODENOSCOPY (EGD) WITH PROPOFOL;  Surgeon: Harvel Quale, MD;  [facility];  Service: Gastroenterology;  Laterality: N/A;   LEFT HEART CATH AND CORONARY ANGIOGRAPHY N/A 03/08/2019   Procedure: LEFT HEART CATH AND CORONARY ANGIOGRAPHY;  Surgeon: Belva Crome, MD;  Location: [place] [facility];  Service: Cardiovascular;  Laterality: N/A;   POLYPECTOMY  03/01/2020   Procedure: POLYPECTOMY;  Surgeon: Harvel Quale, MD;  [facility];  Service: Gastroenterology;;   TEE WITHOUT CARDIOVERSION N/A 03/10/2019   Procedure: TRANSESOPHAGEAL ECHOCARDIOGRAM (TEE);  Surgeon: Prescott Gum,  Collier Salina, MD;  Location: [place];  Service: Open Heart Surgery;  Laterality: N/A;   TONSILLECTOMY     Patient Active Problem List   Diagnosis Date Noted   Pleural effusion on right 01/26/2022   Gout 05/22/2021   Positive fecal occult blood test 01/25/2020   Anemia 01/25/2020   Left leg weakness 04/26/2019   Generalized anxiety disorder    Peripheral edema    Morbid obesity ([place])    Acute gout of right ankle    Hypoalbuminemia due to protein-calorie malnutrition (HCC)    Acute blood loss anemia    Chronic diastolic congestive heart failure  ([place])    Debility 03/27/2019   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 ([place]) 03/27/2019   Lethargy    Persistent atrial fibrillation ([place])    Cerebral embolism with cerebral infarction 03/13/2019   Hx of CABG 03/10/2019   NSTEMI (non-ST elevated myocardial infarction) ([place]) 03/07/2019   Unstable angina ([place]) 03/07/2019   Anxiety 04/23/2018   Difficulty sleeping 01/06/2018   Seasonal allergies 01/04/2018   OSA on CPAP 05/27/2016   Obesity 06/10/2011   Insomnia 06/10/2011   PVC's (premature ventricular contractions)    CAD (coronary artery disease)    Carotid artery disease ([place])    DEGENERATIVE JOINT DISEASE 07/28/2010   Dyslipidemia 06/17/2010   SUBSTANCE ABUSE, MULTIPLE 06/17/2010   Essential hypertension 06/17/2010   CAD 06/17/2010   BRADYCARDIA 06/17/2010    PCP: Allyn Kenner  REFERRING PROVIDER: Allyn Kenner  REFERRING DIAG:  PT eval/tx for R29.6 repeated falls per Allyn Kenner, MD  THERAPY DIAG:  History of multiple falls  Rationale for Evaluation and Treatment Rehabilitation  ONSET DATE: 07/09/2021  SUBJECTIVE:   SUBJECTIVE STATEMENT:  Pt states he is doing well overall. Continues to ambulate community distances without AD, no falls or issues.  PERTINENT HISTORY: CVA, PVC, CABG, poly substance abuse  PAIN:  Are you having pain? Yes: NPRS scale: 0/10 Pain location: low back  Pain description: sharp, constant  Aggravating factors: sitting and getting up  Relieving factors: Meds    PRECAUTIONS: None  WEIGHT BEARING RESTRICTIONS No  FALLS:  Has patient fallen in last 6 months? Yes. Number of falls 2  LIVING ENVIRONMENT: Lives with: lives alone Lives in: House/apartment Stairs: Yes: Internal: 2 steps; on right going up Has following equipment at home: None  OCCUPATION: retired   PLOF: [place] not to fall,   OBJECTIVE:   DIAGNOSTIC FINDINGS:  IMPRESSION: 1. Unchanged appearance of subacute right ACA as  well as ACA/MCA watershed territory infarcts. No new acute infarction is demonstrated. No intracranial hemorrhage. 2. Stable atrophy and chronic small vessel ischemic disease.     Electronically Signed   By: Kellie Simmering   On: 03/22/2019 17:13      COGNITION:  Overall cognitive status: Within functional limits for tasks assessed       POSTURE: rounded shoulders, forward head, decreased lumbar lordosis, increased thoracic kyphosis, and flexed trunk    LOWER EXTREMITY MMT:  MMT Right eval Right 03/11/22 Right 04/09/22 Left eval Left 03/11/22 Left 04/09/22  Hip flexion 5/5 5/5  4+/5 5/5   Hip extension 3/5 3+/5 3+/5 3-/5 3/5 3/5  Hip abduction 5/5 5/5  3/5 3+/5 4+/5  Hip adduction        Hip internal rotation        Hip external rotation        Knee flexion 5/5 5/5  4+ 4+/5 4+/5  Knee  extension 5/5 5/5  5/5 5/5   Ankle dorsiflexion 4/5 5/5  5/5 5/5   Ankle plantarflexion        Ankle inversion        Ankle eversion         (Blank rows = not tested)    FUNCTIONAL TESTS:   Evaluation 30 seconds chair stand test: 10 2 minute walk test: 283 feet no AD (shuffling gait, decreased ground clearance) Single leg stance:  Rt:  2" , Lt:  deferred due to walking shoe   03/11/22 30 seconds chair stand test: 14 2 minute walk test: 316 feet no AD (shuffling gait, decreased ground clearance) Single leg stance:  Rt:  2" , Lt: 1"   04/09/22 30 seconds chair stand test: 15 2 minute walk test: 295  feet no AD (shuffling gait, decreased ground clearance) Single leg stance:  Rt:  3" , Lt: 2"   TODAY'S TREATMENT: 04/09/22 Functional test measures: see above Goal review Discharge instructions  04/07/22 Ambulation working on slower larger steps, UE swing in hallway 150 feet Sit to stand no UE 3X5 Standing:  Heelraises 20X  Hip abduction 2# 2X10  Hip Extension 2# 2X10  4" lateral step downs (heel first) 2X10 each  Tandem stance 3X20" each with intermittent HHA  SLR 3X10" each  with 1 UE assist  Vectors 5X3" with bil UE assist  Toe taps 4" box 2X10 with 1 UE assist   03/26/22 Sit to stand no UE 3X5 Standing:  Heelraises 20X  Hip abduction 2# 2X10  Hip Extension 2# 2X10  Tandem stance 3X20" each with intermittent HHA  SLR 3X10" each with 1 UE assist  Vectors 5X3" with bil UE assist  Toe taps 4" box 2X10 with 1 UE assist  Side stepping in hallway 3Rt 20' each way (120' total)  03/25/22 Octane stepper x 5' Sit to stand no UE assist 3 x 5 Figure 8 walking around the cones x 3 cones down and back x 3 Step over cones x 3 down and back x 5 Standing: Heel raises 2 x 10 2# hip abduction 2 x 10 2# hip extension 2 x 10 Tandem stance 3 x 20" each 1 UE assist SLS 3 x 10 sec each with 1 UE assist Toe taps 4" box 2 x 10 each  Seated: 2# LAQ's 2 x 10                 PATIENT EDUCATION:  Education details: HEP Person educated: Patient Education method: Explanation Education comprehension: returned demonstration   HOME EXERCISE PROGRAM: 04/09/22 Daily walking, larger stride SLS in corner/tandem stance  03/26/22:  vectors  [id number] 03/06/22 gastroc stretch on wall 02/19/22 - Heel Raises with Counter Support  - 2 x daily - 7 x weekly - 2 sets - 10 reps - Toe Raises with Counter Support  - 2 x daily - 7 x weekly - 2 sets - 10 reps - Sit to Stand with Counter Support  - 2 x daily - 7 x weekly - 2 sets - 10 reps - Standing Hip Abduction with Counter Support  - 2 x daily - 7 x weekly - 2 sets - 10 reps - Standing Hip Extension with Counter Support  - 2 x daily - 7 x weekly - 2 sets - 10 reps  02/18/2023: [id number]:              Sitting scapular retraction x 10  Supine active hamstring stretch 30" x 3  Evaluation:  Side lying hip abduction  ASSESSMENT:  CLINICAL IMPRESSION:   Re-evaluation completed this session.  Pt has overall made good progress in therapy the past 8 weeks.  Pt is independent in an advanced HEP and completing these daily.   Encouraged to check into a gym membership as well.  Pt instructed to work more on his balance and improving his gait as these continue to be his limiting factors.  Pt without any additional questions or concerns.  OBJECTIVE IMPAIRMENTS decreased activity tolerance, decreased balance, difficulty walking, and decreased strength.   ACTIVITY LIMITATIONS carrying, lifting, and locomotion level  PARTICIPATION LIMITATIONS: community activity  PERSONAL FACTORS Age, Time since onset of injury/illness/exacerbation, and 3+ comorbidities: CVA, brady cardio, substance abuse, CABG  are also affecting patient's functional outcome.   REHAB POTENTIAL: Good  CLINICAL DECISION MAKING: Evolving/moderate complexity  EVALUATION COMPLEXITY: Moderate   GOALS: Goals reviewed with patient? Yes  SHORT TERM GOALS: Target date: 02/26/2022  PT to be I in HEP to decrease risk of falling  Baseline: Goal status: MET  2.  Pt mm strength to improve 1/2 grade to allow pt to come sit to stand 12  in 30 seconds to demonstrate improved power.   Baseline:  Goal status: MET  3.  Pt to be able to single leg stance on both LE for 10 seconds to reduce risk of falling  Baseline:  Goal status: NOT MET   LONG TERM GOALS: Target date: 03/12/2022   PT to be I in advanced HEP to improve mm strength by one grade to allow improved mobility Baseline:  Goal status: MET partly met  2.  Pt to be able to go up and down 8 steps with one handrail assist  Baseline:  Goal status: MET  3.  Pt to be able to single leg stance on both LE for 20 " to reduce risk of falling.  Baseline:  Goal status: NOT MET     PLAN: PT FREQUENCY: 2x/week  PT DURATION: 4 weeks  PLANNED INTERVENTIONS: Therapeutic exercises, Therapeutic activity, Neuromuscular re-education, Balance training, Gait training, Patient/Family education, Self Care, and Manual therapy  PLAN FOR NEXT SESSION:  Discharge to HEP   9:07 AM, 04/07/22 Teena Irani,  PTA/CLT [place]  [place], [place] [phone]  Ph: [phone]

## 2022-04-10 ENCOUNTER — Ambulatory Visit: Payer: Medicare Other | Admitting: Pulmonary Disease

## 2022-04-10 ENCOUNTER — Encounter: Payer: Self-pay | Admitting: Pulmonary Disease

## 2022-04-10 ENCOUNTER — Ambulatory Visit (HOSPITAL_COMMUNITY)
Admission: RE | Admit: 2022-04-10 | Discharge: 2022-04-10 | Disposition: A | Discharge status: Home / Self Care | Payer: Medicare Other | Source: Ambulatory Visit | Attending: Pulmonary Disease | Admitting: Pulmonary Disease

## 2022-04-10 VITALS — BP 134/88 | HR 84 | Temp 98.3°F | Ht 66.5 in | Wt 236.6 lb

## 2022-04-10 DIAGNOSIS — J90 Pleural effusion, not elsewhere classified: Secondary | ICD-10-CM | POA: Diagnosis not present

## 2022-04-10 DIAGNOSIS — J9811 Atelectasis: Secondary | ICD-10-CM | POA: Diagnosis not present

## 2022-04-10 DIAGNOSIS — G4733 Obstructive sleep apnea (adult) (pediatric): Secondary | ICD-10-CM | POA: Diagnosis not present

## 2022-04-10 NOTE — Progress Notes (Signed)
   Subjective:    Patient ID: Martin [name], male    DOB: 11/07/1955, 66 y.o.   MRN: [id number]  HPI  66 yo obese man with CAD for FU of obstructive sleep apnea. He had a  sleep study in 2001 when he had a heart attack and maintained on CPAP since then.  He got a new CPAP in 2017 12 cm   PMH - CABG 2020 ,CVA permanent atrial fibrillation (dx 10/2017),  chronic HFpEF (EF 50-55%),   habitual alcohol use, prior street use of THC/Vicodin CVA 03/2019, residual left lower extremity weakness Wife had ALS , passed 06/2021  He presented with persistent right effusion which dates back to April 2023,chest x-ray from 05/2019 appears clear On a cardiology visit, CXR RLL infx CT chest confirmed right lower lobe infiltrate with air bronchograms, very atypical rounded appearance appearance. He had fluid in esophagus so this could possibly be aspiration pneumonia   Rxed with Levaquin x 7 ds Chest x-ray 8/21 showed persistent right lower lobe effusion/?  Infiltrate  53-monthfollow-up visit today He feels like breathing is improved, denies coughing or wheezing He has completed physical therapy and his balance is improved.  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 Small left pleural effusion.  [facility] with lowest desaturation of 86%    Review of Systems neg for any significant sore throat, dysphagia, itching, sneezing, nasal congestion or excess/ purulent secretions, fever, chills, sweats, unintended wt loss, pleuritic or exertional cp, hempoptysis, orthopnea pnd or change in  chronic leg swelling. Also denies presyncope, palpitations, heartburn, abdominal pain, nausea, vomiting, diarrhea or change in bowel or urinary habits, dysuria,hematuria, rash, arthralgias, visual complaints, headache, numbness weakness or ataxia.     Objective:   Physical Exam  Gen. Pleasant, obese, in no distress ENT - no lesions, no post nasal drip Neck: No JVD, no thyromegaly, no carotid bruits Lungs: no use of accessory muscles, no dullness to percussion, decreased right base without rales or rhonchi  Cardiovascular: Rhythm regular, heart sounds  normal, no murmurs or gallops, 1+ peripheral edema Musculoskeletal: No deformities, no cyanosis or clubbing , no tremors       Assessment & Plan:

## 2022-04-10 NOTE — Patient Instructions (Signed)
  X CXR today to follow on effusion

## 2022-04-10 NOTE — Assessment & Plan Note (Signed)
Lymphocytic exudate, cytology negative on thoracentesis. Atypical rounded infiltrate at the right base. We will obtain chest x-ray to confirm resolution, if not may need CT chest for further clarification

## 2022-04-10 NOTE — Assessment & Plan Note (Signed)
CPAP download was reviewed which shows good control of events on 12 cm, excellent compliance about 8 hours per night. CPAP is only helped improve his daytime somnolence and fatigue  Weight loss encouraged, compliance with goal of at least 4-6 hrs every night is the expectation. Advised against medications with sedative side effects Cautioned against driving when sleepy - understanding that sleepiness will vary on a day to day basis

## 2022-04-14 ENCOUNTER — Telehealth: Payer: Self-pay | Admitting: *Deleted

## 2022-04-14 ENCOUNTER — Encounter (HOSPITAL_COMMUNITY): Payer: Medicare Other | Admitting: Physical Therapy

## 2022-04-14 ENCOUNTER — Telehealth: Payer: Self-pay | Admitting: Pulmonary Disease

## 2022-04-14 MED ORDER — FUROSEMIDE 40 MG PO TABS: 40.0000 mg | ORAL_TABLET | Freq: Every day | ORAL | 1 refills | Status: DC

## 2022-04-14 NOTE — Telephone Encounter (Signed)
Reports weighing daily Says he is not gaining weight Weight today 231 lbs Weight 04/13/22 229 lbs Says he is staying around 230 lbs Denies worsening SOB. Denies chest pain or dizziness.  Confirmed that he takes furosemide 40 mg daily

## 2022-04-14 NOTE — Telephone Encounter (Signed)
-----   Message from Satira Sark, MD sent at 04/14/2022  8:19 AM EST ----- Results reviewed.  Please see chart note from Dr. [name] regarding chest x-ray review.  Can you talk with patient to see how he is doing in terms of his weights?  We may need to increase his standing Lasix from 40 mg daily to 40 mg alternating with 60 mg every other day.

## 2022-04-14 NOTE — Telephone Encounter (Signed)
Called and spoke to patient and notified him that Dr. Domenic Polite has documented on CXR results we sent. Patient is going to call Dr. Chuck Hint office for recs on lasix. Nothing further needed

## 2022-04-14 NOTE — Telephone Encounter (Signed)
Patient informed and verbalized understanding of plan. 

## 2022-04-16 ENCOUNTER — Encounter (HOSPITAL_COMMUNITY): Payer: Medicare Other | Admitting: Physical Therapy

## 2022-04-20 DIAGNOSIS — M722 Plantar fascial fibromatosis: Secondary | ICD-10-CM | POA: Diagnosis not present

## 2022-04-20 DIAGNOSIS — M79672 Pain in left foot: Secondary | ICD-10-CM | POA: Diagnosis not present

## 2022-04-20 DIAGNOSIS — M79671 Pain in right foot: Secondary | ICD-10-CM | POA: Diagnosis not present

## 2022-04-21 ENCOUNTER — Encounter (HOSPITAL_COMMUNITY): Payer: Medicare Other

## 2022-04-23 ENCOUNTER — Encounter (HOSPITAL_COMMUNITY): Payer: Medicare Other

## 2022-04-25 ENCOUNTER — Other Ambulatory Visit: Payer: Self-pay | Admitting: Cardiology

## 2022-04-28 DIAGNOSIS — G4733 Obstructive sleep apnea (adult) (pediatric): Secondary | ICD-10-CM | POA: Diagnosis not present

## 2022-05-05 DIAGNOSIS — I110 Hypertensive heart disease with heart failure: Secondary | ICD-10-CM | POA: Diagnosis not present

## 2022-05-05 DIAGNOSIS — I10 Essential (primary) hypertension: Secondary | ICD-10-CM | POA: Diagnosis not present

## 2022-05-05 DIAGNOSIS — G4733 Obstructive sleep apnea (adult) (pediatric): Secondary | ICD-10-CM | POA: Diagnosis not present

## 2022-05-05 DIAGNOSIS — J90 Pleural effusion, not elsewhere classified: Secondary | ICD-10-CM | POA: Diagnosis not present

## 2022-05-05 DIAGNOSIS — I2510 Atherosclerotic heart disease of native coronary artery without angina pectoris: Secondary | ICD-10-CM | POA: Diagnosis not present

## 2022-05-05 DIAGNOSIS — M5451 Vertebrogenic low back pain: Secondary | ICD-10-CM | POA: Diagnosis not present

## 2022-05-05 DIAGNOSIS — I4891 Unspecified atrial fibrillation: Secondary | ICD-10-CM | POA: Diagnosis not present

## 2022-05-07 DIAGNOSIS — S233XXA Sprain of ligaments of thoracic spine, initial encounter: Secondary | ICD-10-CM | POA: Diagnosis not present

## 2022-05-07 DIAGNOSIS — M9903 Segmental and somatic dysfunction of lumbar region: Secondary | ICD-10-CM | POA: Diagnosis not present

## 2022-05-07 DIAGNOSIS — M9902 Segmental and somatic dysfunction of thoracic region: Secondary | ICD-10-CM | POA: Diagnosis not present

## 2022-05-07 DIAGNOSIS — M9901 Segmental and somatic dysfunction of cervical region: Secondary | ICD-10-CM | POA: Diagnosis not present

## 2022-05-07 DIAGNOSIS — S134XXA Sprain of ligaments of cervical spine, initial encounter: Secondary | ICD-10-CM | POA: Diagnosis not present

## 2022-05-07 DIAGNOSIS — S338XXA Sprain of other parts of lumbar spine and pelvis, initial encounter: Secondary | ICD-10-CM | POA: Diagnosis not present

## 2022-05-14 DIAGNOSIS — M9901 Segmental and somatic dysfunction of cervical region: Secondary | ICD-10-CM | POA: Diagnosis not present

## 2022-05-14 DIAGNOSIS — M9902 Segmental and somatic dysfunction of thoracic region: Secondary | ICD-10-CM | POA: Diagnosis not present

## 2022-05-14 DIAGNOSIS — M9903 Segmental and somatic dysfunction of lumbar region: Secondary | ICD-10-CM | POA: Diagnosis not present

## 2022-05-14 DIAGNOSIS — S338XXA Sprain of other parts of lumbar spine and pelvis, initial encounter: Secondary | ICD-10-CM | POA: Diagnosis not present

## 2022-05-14 DIAGNOSIS — S233XXA Sprain of ligaments of thoracic spine, initial encounter: Secondary | ICD-10-CM | POA: Diagnosis not present

## 2022-05-14 DIAGNOSIS — S134XXA Sprain of ligaments of cervical spine, initial encounter: Secondary | ICD-10-CM | POA: Diagnosis not present

## 2022-05-19 DIAGNOSIS — S233XXA Sprain of ligaments of thoracic spine, initial encounter: Secondary | ICD-10-CM | POA: Diagnosis not present

## 2022-05-19 DIAGNOSIS — M9902 Segmental and somatic dysfunction of thoracic region: Secondary | ICD-10-CM | POA: Diagnosis not present

## 2022-05-19 DIAGNOSIS — M9901 Segmental and somatic dysfunction of cervical region: Secondary | ICD-10-CM | POA: Diagnosis not present

## 2022-05-19 DIAGNOSIS — M9903 Segmental and somatic dysfunction of lumbar region: Secondary | ICD-10-CM | POA: Diagnosis not present

## 2022-05-19 DIAGNOSIS — S338XXA Sprain of other parts of lumbar spine and pelvis, initial encounter: Secondary | ICD-10-CM | POA: Diagnosis not present

## 2022-05-19 DIAGNOSIS — S134XXA Sprain of ligaments of cervical spine, initial encounter: Secondary | ICD-10-CM | POA: Diagnosis not present

## 2022-06-02 ENCOUNTER — Other Ambulatory Visit: Payer: Self-pay | Admitting: Cardiology

## 2022-06-05 ENCOUNTER — Other Ambulatory Visit: Payer: Self-pay | Admitting: Cardiology

## 2022-06-05 NOTE — Telephone Encounter (Signed)
Prescription refill request for Eliquis received. Indication: AF Last office visit: 02/10/22  Myles Gip MD Scr: 0.71 on 01/14/22 Age: 66 Weight: 109kg  Based on above findings Eliquis '5mg'$  twice daily is the appropriate dose.  Refill approved.

## 2022-06-22 NOTE — Progress Notes (Unsigned)
Cardiology Office Note  Date: 06/23/2022   ID: Victory, Strollo Aug 11, 1955, MRN [id number]  PCP:  [name], MD  Cardiologist:  Rozann Lesches, MD Electrophysiologist:  [name], MD   Chief Complaint  Patient presents with   Cardiac follow-up    History of Present Illness: Martin [name] is a 67 y.o. male last seen in September 2023.  He is here for a routine visit.  States that he went back to work part-time with his son at [employer] to try and stay busy.  Also doing the maintenance cardiac rehab program 3 days a week.  Reports NYHA class II dyspnea, no angina, no palpitations or syncope.  Still adjusting to the loss of his wife.  Weight has been relatively stable.  He is taking Lasix every other day.  I reviewed the remainder of his cardiac regimen.  He will have lab work and follow-up with Dr. Nevada Crane in the near future.  Past Medical History:  Diagnosis Date   (HFpEF) heart failure with preserved ejection fraction (HCC)    CAD (coronary artery disease)    a. s/p PCI to LAD 2001. b. s/p stenting to the RCA and mid LAD December 2011, residual distal RCA disease treated medically. c.  Multivessel disease status post CABG October 2020   Carotid artery disease ([place])    Essential hypertension    GERD (gastroesophageal reflux disease)    Habitual alcohol use    Hyperlipidemia    Low back pain    Myocardial infarction ([place]) 2020   OSA on CPAP    Osteoarthritis    Permanent atrial fibrillation ([place])    a. dx 10/2017   Polysubstance abuse ([place])    History of marijuana and Vicodin abuse   PVC's (premature ventricular contractions)    Seasonal allergies    Stroke ([place]) 2020    Current Outpatient Medications  Medication Sig Dispense Refill   allopurinol (ZYLOPRIM) 300 MG tablet Take 300 mg by mouth daily.      atorvastatin (LIPITOR) 80 MG tablet TAKE 1 TABLET BY MOUTH EVERY DAY 90 tablet 1   Cholecalciferol (VITAMIN D-3) 1000 UNITS CAPS Take 1,000  Units by mouth every morning.      colchicine 0.6 MG tablet Take 1 tablet (0.6 mg total) by mouth daily at 6 PM. (Patient taking differently: Take 0.6 mg by mouth as needed.) 30 tablet 1   Cyanocobalamin (VITAMIN B-12) 2500 MCG SUBL Place 2,500 mcg under the tongue every morning.      diclofenac Sodium (VOLTAREN) 1 % GEL Takes only as needed     docusate sodium (COLACE) 100 MG capsule Take 2 capsules (200 mg total) by mouth daily. (Patient taking differently: Take 100 mg by mouth at bedtime as needed.) 60 capsule 0   ELIQUIS 5 MG TABS tablet TAKE ONE TABLET BY MOUTH TWICE DAILY 180 tablet 1   fluticasone (FLONASE) 50 MCG/ACT nasal spray Place 2 sprays into the nose daily.     folic acid (FOLVITE) 1 MG tablet TAKE ONE TABLET BY MOUTH EVERY DAY (Patient taking differently: Take 1 mg by mouth daily.) 30 tablet 6   furosemide (LASIX) 40 MG tablet Take 1-1.5 tablets (40-60 mg total) by mouth daily. Alternate 40 mg with 60 mg daily 135 tablet 1   HYDROcodone-acetaminophen (NORCO) 10-325 MG tablet Take 1 tablet by mouth every 6 (six) hours as needed.     JARDIANCE 10 MG TABS tablet TAKE ONE TABLET BY MOUTH EVERY  DAY BEFORE BREAKFAST 30 tablet 6   KLOR-CON M15 15 MEQ tablet TAKE 2 TABLETS BY MOUTH DAILY 180 tablet 3   loratadine (CLARITIN) 10 MG tablet Take 10 mg by mouth daily.     LORazepam (ATIVAN) 0.5 MG tablet Take 0.5 mg by mouth at bedtime.      meclizine (ANTIVERT) 32 MG tablet Take 32 mg by mouth daily as needed for dizziness.      nitroGLYCERIN (NITROSTAT) 0.4 MG SL tablet Place 1 tablet (0.4 mg total) under the tongue every 5 (five) minutes as needed for chest pain. 25 tablet 3   pantoprazole (PROTONIX) 40 MG tablet Take 1 tablet (40 mg total) by mouth daily. 30 tablet 0   polyethylene glycol (MIRALAX / GLYCOLAX) packet Take 17 g by mouth at bedtime as needed for mild constipation.     sacubitril-valsartan (ENTRESTO) 24-26 MG TAKE ONE TABLET BY MOUTH TWICE DAILY 60 tablet 11   spironolactone  (ALDACTONE) 25 MG tablet Take 0.5 tablets (12.5 mg total) by mouth daily. 45 tablet 3   tamsulosin (FLOMAX) 0.4 MG CAPS capsule Take 0.4 mg by mouth at bedtime.     No current facility-administered medications for this visit.   Allergies:  Penicillins   ROS: No orthopnea or PND.  Physical Exam: VS:  BP 114/64   Pulse 70   Ht '5\' 7"'$  (1.702 m)   Wt 237 lb 9.6 oz (107.8 kg)   SpO2 96%   BMI 37.21 kg/m , BMI Body mass index is 37.21 kg/m.  Wt Readings from Last 3 Encounters:  06/23/22 237 lb 9.6 oz (107.8 kg)  04/10/22 236 lb 9.6 oz (107.3 kg)  02/10/22 240 lb 6.4 oz (109 kg)    General: Patient appears comfortable at rest. HEENT: Conjunctiva and lids normal. Neck: Supple, no elevated JVP or carotid bruits. Lungs: Clear to auscultation, nonlabored breathing at rest. Cardiac: Irregularly irregular, no S3 or significant systolic murmur. Extremities: No pitting edema.  ECG:  An ECG dated 12/11/2021 was personally reviewed today and demonstrated:  Atrial fibrillation with nonspecific ST-T changes.  Recent Labwork: 01/14/2022: B Natriuretic Peptide 255.0; BUN 19; Creatinine, Ser 0.71; Hemoglobin 13.5; Platelets 184; Potassium 4.3; Sodium 136     Component Value Date/Time   CHOL 170 03/07/2019 2358   TRIG 218 (H) 03/07/2019 2358   HDL 41 03/07/2019 2358   CHOLHDL 4.1 03/07/2019 2358   VLDL 44 (H) 03/07/2019 2358   [place] 85 03/07/2019 2358  August 2023: Cholesterol 147, triglycerides 74, HDL 57, LDL 76  Other Studies Reviewed Today:  Lexiscan Myoview 11/11/2021:   Findings are consistent with prior myocardial infarction. The study is low risk.   No ST deviation was noted.   LV perfusion is abnormal. There is no evidence of ischemia. There is evidence of infarction. Defect 1: There is a small defect with mild reduction in uptake present in the apical to basal inferior location(s) that is fixed. There is abnormal wall motion in the defect area. Consistent with infarction.   Left  ventricular function is abnormal. Global function is moderately reduced. End diastolic cavity size is normal. End systolic cavity size is normal.   Small inferior wall infarct from apex to base no ischemia EF estimated at 42% but patient in afib    Echocardiogram 12/23/2021:  1. Anteroseptal wall, apex are hypokinetic. Marland Kitchen Left ventricular ejection  fraction, by estimation, is 40 to 45%. The left ventricle has mildly  decreased function. The left ventricle demonstrates regional wall motion  abnormalities (see scoring  diagram/findings for description). Left ventricular diastolic parameters  are indeterminate.   2. Right ventricular systolic function is normal. The right ventricular  size is normal. Tricuspid regurgitation signal is inadequate for assessing  PA pressure.   3. Left atrial size was severely dilated.   4. Right atrial size was severely dilated.   5. The mitral valve is normal in structure. Trivial mitral valve  regurgitation. No evidence of mitral stenosis.   6. The tricuspid valve is abnormal.   7. The aortic valve is tricuspid. There is mild calcification of the  aortic valve. There is mild thickening of the aortic valve. Aortic valve  regurgitation is mild. Mild to moderate aortic valve stenosis. Lower than  expected AV gradients in setting of  SVI 28      Aortic valve mean gradient measures 12.5 mmHg. Aortic valve peak  gradient measures 23.3 mmHg. Aortic valve area, by VTI measures 1.39 cm.   8. The inferior vena cava is normal in size with greater than 50%  respiratory variability, suggesting right atrial pressure of 3 mmHg.   Assessment and Plan:  1.  HFmrEF with ischemic cardiomyopathy, LVEF 40 to 45% by last assessment.  Clinically stable with NYHA class II symptoms and no weight change.  Continue Jardiance, Entresto, Aldactone, and Lasix at current doses.  He is not on beta-blocker with history of symptomatic bradycardia.  Update echocardiogram for his next  visit.  2.  Multivessel CAD status post CABG in October 2020.  He reports no angina at this time.  Continue Lipitor.  He has not had to use any nitroglycerin in the interim.  3.  Permanent atrial fibrillation with CHA2DS2-VASc score of 4.  Continue Eliquis for stroke prophylaxis.  He is asymptomatic and heart rate is reasonable in the absence of AV nodal blockers.  Follow-up lab work with Dr. Nevada Crane as planned.  Medication Adjustments/Labs and Tests Ordered: Current medicines are reviewed at length with the patient today.  Concerns regarding medicines are outlined above.   Tests Ordered: Orders Placed This Encounter  Procedures   ECHOCARDIOGRAM COMPLETE    Medication Changes: No orders of the defined types were placed in this encounter.   Disposition:  Follow up  6 months.  Signed, Satira Sark, MD, [facility] 06/23/2022 11:01 AM    [facility] [place], [place], [place] [address] Phone: [phone]; Fax: [phone]

## 2022-06-23 ENCOUNTER — Encounter: Payer: Self-pay | Admitting: Cardiology

## 2022-06-23 ENCOUNTER — Ambulatory Visit: Payer: Medicare Other | Attending: Internal Medicine | Admitting: Cardiology

## 2022-06-23 VITALS — BP 114/64 | HR 70 | Ht 67.0 in | Wt 237.6 lb

## 2022-06-23 DIAGNOSIS — I4821 Permanent atrial fibrillation: Secondary | ICD-10-CM | POA: Diagnosis not present

## 2022-06-23 DIAGNOSIS — I5022 Chronic systolic (congestive) heart failure: Secondary | ICD-10-CM

## 2022-06-23 DIAGNOSIS — I25119 Atherosclerotic heart disease of native coronary artery with unspecified angina pectoris: Secondary | ICD-10-CM | POA: Insufficient documentation

## 2022-06-23 NOTE — Patient Instructions (Addendum)
Medication Instructions:  Your physician recommends that you continue on your current medications as directed. Please refer to the Current Medication list given to you today.  Labwork: none  Testing/Procedures: Your physician has requested that you have an echocardiogram in 6 months just before your next visit. Echocardiography is a painless test that uses sound waves to create images of your heart. It provides your doctor with information about the size and shape of your heart and how well your heart's chambers and valves are working. This procedure takes approximately one hour. There are no restrictions for this procedure. Please do NOT wear cologne, perfume, aftershave, or lotions (deodorant is allowed). Please arrive 15 minutes prior to your appointment time.  Follow-Up: Your physician recommends that you schedule a follow-up appointment in: 6 months  Any Other Special Instructions Will Be Listed Below (If Applicable).  If you need a refill on your cardiac medications before your next appointment, please call your pharmacy. 

## 2022-07-06 ENCOUNTER — Other Ambulatory Visit: Payer: Self-pay

## 2022-07-06 MED ORDER — EMPAGLIFLOZIN 10 MG PO TABS
ORAL_TABLET | ORAL | 1 refills | Status: DC
Start: 2022-07-06 — End: 2023-04-12

## 2022-07-06 NOTE — Telephone Encounter (Signed)
The patient's pharmacy [facility],  left a message that his insurance requires a quanity jardiance for them to pay for the refills. The refills was sent.

## 2022-07-27 ENCOUNTER — Telehealth: Payer: Self-pay | Admitting: Cardiology

## 2022-07-27 DIAGNOSIS — G4733 Obstructive sleep apnea (adult) (pediatric): Secondary | ICD-10-CM | POA: Diagnosis not present

## 2022-07-27 MED ORDER — NITROGLYCERIN 0.4 MG SL SUBL: 0.4000 mg | SUBLINGUAL_TABLET | SUBLINGUAL | 3 refills | Status: DC | PRN

## 2022-07-27 NOTE — Telephone Encounter (Signed)
*  STAT* If patient is at the pharmacy, call can be transferred to refill team.   1. Which medications need to be refilled? (please list name of each medication and dose if known) nitroGLYCERIN (NITROSTAT) 0.4 MG SL tablet   2. Which pharmacy/location (including street and city if local pharmacy) is medication to be sent to? [facility], [place]   3. Do they need a 30 day or 90 day supply? 30 day

## 2022-07-27 NOTE — Telephone Encounter (Signed)
Done

## 2022-07-30 DIAGNOSIS — R7301 Impaired fasting glucose: Secondary | ICD-10-CM | POA: Diagnosis not present

## 2022-07-30 DIAGNOSIS — E782 Mixed hyperlipidemia: Secondary | ICD-10-CM | POA: Diagnosis not present

## 2022-08-05 ENCOUNTER — Encounter: Payer: Self-pay | Admitting: Internal Medicine

## 2022-08-05 DIAGNOSIS — M5451 Vertebrogenic low back pain: Secondary | ICD-10-CM | POA: Diagnosis not present

## 2022-08-05 DIAGNOSIS — Z0000 Encounter for general adult medical examination without abnormal findings: Secondary | ICD-10-CM | POA: Diagnosis not present

## 2022-08-05 DIAGNOSIS — I2510 Atherosclerotic heart disease of native coronary artery without angina pectoris: Secondary | ICD-10-CM | POA: Diagnosis not present

## 2022-08-05 DIAGNOSIS — D509 Iron deficiency anemia, unspecified: Secondary | ICD-10-CM | POA: Diagnosis not present

## 2022-08-05 DIAGNOSIS — I10 Essential (primary) hypertension: Secondary | ICD-10-CM | POA: Diagnosis not present

## 2022-08-05 DIAGNOSIS — G4733 Obstructive sleep apnea (adult) (pediatric): Secondary | ICD-10-CM | POA: Diagnosis not present

## 2022-08-05 DIAGNOSIS — I110 Hypertensive heart disease with heart failure: Secondary | ICD-10-CM | POA: Diagnosis not present

## 2022-08-05 DIAGNOSIS — J90 Pleural effusion, not elsewhere classified: Secondary | ICD-10-CM | POA: Diagnosis not present

## 2022-08-05 DIAGNOSIS — R7301 Impaired fasting glucose: Secondary | ICD-10-CM | POA: Diagnosis not present

## 2022-08-05 DIAGNOSIS — I4891 Unspecified atrial fibrillation: Secondary | ICD-10-CM | POA: Diagnosis not present

## 2022-08-11 ENCOUNTER — Ambulatory Visit (HOSPITAL_COMMUNITY)
Admission: RE | Admit: 2022-08-11 | Discharge: 2022-08-11 | Disposition: A | Discharge status: Home / Self Care | Payer: Medicare Other | Source: Ambulatory Visit | Attending: Pulmonary Disease | Admitting: Pulmonary Disease

## 2022-08-11 ENCOUNTER — Ambulatory Visit: Payer: Medicare Other | Admitting: Pulmonary Disease

## 2022-08-11 ENCOUNTER — Encounter: Payer: Self-pay | Admitting: Pulmonary Disease

## 2022-08-11 VITALS — BP 128/80 | HR 74 | Ht 67.0 in | Wt 237.8 lb

## 2022-08-11 DIAGNOSIS — G4733 Obstructive sleep apnea (adult) (pediatric): Secondary | ICD-10-CM | POA: Diagnosis not present

## 2022-08-11 DIAGNOSIS — J90 Pleural effusion, not elsewhere classified: Secondary | ICD-10-CM

## 2022-08-11 NOTE — Assessment & Plan Note (Signed)
CPAP download was reviewed which shows excellent control of events on 12 cm with minimal leak.  He is very compliant more than 7 hours per night and CPAP is only helped improve his daytime somnolence and fatigue  Weight loss encouraged, compliance with goal of at least 4-6 hrs every night is the expectation. Advised against medications with sedative side effects Cautioned against driving when sleepy - understanding that sleepiness will vary on a day to day basis

## 2022-08-11 NOTE — Patient Instructions (Signed)
  X CXR today   CPPA is working well

## 2022-08-11 NOTE — Progress Notes (Signed)
   Subjective:    Patient ID: Martin [name], male    DOB: Mar 31, 1956, 67 y.o.   MRN: [id number]  HPI  67 yo obese man with CAD for FU of obstructive sleep apnea. He had a  sleep study in 2001 when he had a heart attack and maintained on CPAP since then.  He got a new CPAP in 2017 12 cm   PMH - CABG 2020 ,CVA permanent atrial fibrillation (dx 10/2017),  chronic HFpEF (EF 50-55%),   habitual alcohol use, prior street use of THC/Vicodin CVA 03/2019, residual left lower extremity weakness Wife had ALS , passed 06/2021 He smoked 25 PY before he quit 2001   He presented with persistent right effusion which dates back to April 2023,chest x-ray from 05/2019 appears clear On a cardiology visit, CXR RLL infx CT chest confirmed right lower lobe infiltrate with air bronchograms, very atypical rounded appearance appearance. He had fluid in esophagus so this could possibly be aspiration pneumonia   Rxed with Levaquin x 7 ds Chest x-ray 8/21 showed persistent right lower lobe effusion/?  Infiltrate   Chief Complaint  Patient presents with   Follow-up    Breathing is doing well  No concerns with CPAP    47-monthfollow-up visit. He denies dyspnea or chest pain, breathing is okay. He is tolerating his CPAP well, no problems with mask or pressure. He continues with maintenance cardiac rehab.  He has joined his son and works in [employer]  Chest x-ray last visit 04/2022 showed small loculated left effusion and stable right effusion, Lasix was increased to 40 mg alternating with 60 mg.  He has seen cardiology and reviewed their evaluation. He denies significant pedal edema  Chest x-ray to my review continues to show bilateral small loculated effusions  Significant tests/ events reviewed 01/29/22 RT thora >> 550cc -lymphocytic exudate, cytology neg   CT chest wo con 01/2022 >>moderate to large sized infiltrate in right lower lobe .Small to moderate right pleural effusion. Small left pleural  effusion.   [place] 29/hour with lowest desaturation of 86%  Review of Systems neg for any significant sore throat, dysphagia, itching, sneezing, nasal congestion or excess/ purulent secretions, fever, chills, sweats, unintended wt loss, pleuritic or exertional cp, hempoptysis, orthopnea pnd or change in chronic leg swelling. Also denies presyncope, palpitations, heartburn, abdominal pain, nausea, vomiting, diarrhea or change in bowel or urinary habits, dysuria,hematuria, rash, arthralgias, visual complaints, headache, numbness weakness or ataxia.     Objective:   Physical Exam  Gen. Pleasant, obese, in no distress ENT - no lesions, no post nasal drip Neck: No JVD, no thyromegaly, no carotid bruits Lungs: no use of accessory muscles, no dullness to percussion, decreased without rales or rhonchi  Cardiovascular: Rhythm regular, heart sounds  normal, no murmurs or gallops, no peripheral edema Musculoskeletal: No deformities, no cyanosis or clubbing , no tremors       Assessment & Plan:

## 2022-08-11 NOTE — Assessment & Plan Note (Addendum)
Bilateral small loculated effusions persist.  Lasix has been increased. Previously shown to be exudative.  They have not increased over a period of 7 months. May repeat CT imaging

## 2022-09-01 ENCOUNTER — Other Ambulatory Visit: Payer: Self-pay | Admitting: Cardiology

## 2022-09-14 ENCOUNTER — Other Ambulatory Visit: Payer: Self-pay | Admitting: Student

## 2022-10-26 DIAGNOSIS — G4733 Obstructive sleep apnea (adult) (pediatric): Secondary | ICD-10-CM | POA: Diagnosis not present

## 2022-11-09 ENCOUNTER — Other Ambulatory Visit: Payer: Self-pay | Admitting: Cardiology

## 2022-12-07 ENCOUNTER — Other Ambulatory Visit: Payer: Medicare Other

## 2022-12-07 NOTE — Progress Notes (Unsigned)
Cardiology Office Note  Date: 12/08/2022   ID: Tyner, Rison [birth year], MRN [id number]  History of Present Illness: Martin [name] is a 67 y.o. male last seen in January.  He is here for a routine visit.  Reports no exertional chest pain or palpitations, describes NYHA class II dyspnea.  His weight is down about 5 pounds and he has not been using Lasix regularly.  We went over his medications and he reports compliance with his remaining regimen.  I reviewed his echocardiogram performed earlier this morning.  LVEF now low normal range at 50 to 55%.  Aortic stenosis is mild with relatively stable mean AV gradient 13 mmHg.  ECG today shows rate controlled atrial fibrillation with left anterior fascicular block and nonspecific ST-T changes.  Reviewed his lab work from February.  LDL was 61 at that time.  Physical Exam: VS:  BP 138/70   Pulse 62   Ht 5\' 7"  (1.702 m)   Wt 232 lb 12.8 oz (105.6 kg)   SpO2 94%   BMI 36.46 kg/m , BMI Body mass index is 36.46 kg/m.  Wt Readings from Last 3 Encounters:  12/08/22 232 lb 12.8 oz (105.6 kg)  08/11/22 237 lb 12.8 oz (107.9 kg)  06/23/22 237 lb 9.6 oz (107.8 kg)    General: Patient appears comfortable at rest. HEENT: Conjunctiva and lids normal. Neck: Supple, no elevated JVP or carotid bruits. Lungs: Clear to auscultation, nonlabored breathing at rest. Cardiac: Irregularly irregular, 1/6 systolic murmur.. Extremities: No pitting edema.  ECG:  An ECG dated 12/11/2021 was personally reviewed today and demonstrated:  Atrial fibrillation with nonspecific ST-T changes.  Labwork:  February 2024: Hemoglobin 14.1, platelets 179, BUN 16, creatinine 0.84, potassium 4.5, AST 20, ALT 12, cholesterol 128, triglycerides 59, HDL 54, LDL 61, hemoglobin A1c 5.8%  Other Studies Reviewed Today:  Echocardiogram 12/08/2022:  1. Left ventricular ejection fraction, by estimation, is 50 to 55%. The  left ventricle has low normal function. Left  ventricular endocardial  border not optimally defined to evaluate regional wall motion. The left  ventricular internal cavity size was  mildly dilated. There is mild concentric left ventricular hypertrophy.  Left ventricular diastolic parameters are indeterminate.   2. Right ventricular systolic function is normal. The right ventricular  size is mildly enlarged. Tricuspid regurgitation signal is inadequate for  assessing PA pressure.   3. Left atrial size was moderately dilated.   4. Right atrial size was moderately dilated.   5. The mitral valve is grossly normal. Trivial mitral valve  regurgitation.   6. The aortic valve is tricuspid. There is mild calcification of the  aortic valve. Aortic valve regurgitation is not visualized. Mild aortic  valve stenosis. Aortic valve area, by VTI measures 1.49 cm. Aortic valve  mean gradient measures 13.0 mmHg.   7. The inferior vena cava is normal in size with greater than 50%  respiratory variability, suggesting right atrial pressure of 3 mmHg.  Assessment and Plan:  1.  CAD status post angioplasty of the LAD in 2001, stent interventions to the RCA and LAD in 2011, and ultimately CABG in October 2020 with LIMA to LAD, SVG to ramus intermedius, and SVG to PDA.  He does not report any active angina at this time.  Not on aspirin given use of Eliquis.  Continue Lipitor and as needed nitroglycerin.  2.  History of ischemic cardiomyopathy, LVEF now low normal range at 50 to 55% by follow-up echocardiogram earlier this morning.  Continue Entresto, Aldactone, and Jardiance.  He is not on beta-blockers with history of symptomatic bradycardia.  Transitioning Lasix and potassium supplement to as needed use at this point.  3.  Permanent atrial fibrillation with CHA2DS2-VASc score of 4.  Heart rate controlled at baseline in the absence of AV nodal blockers.  He continues on Eliquis for stroke prophylaxis, does not report any spontaneous bleeding problems.  I  reviewed his lab work from February.  4.  Essential hypertension.  No changes made to present regimen.  5.  Degenerative calcific aortic stenosis, mild by echocardiogram performed earlier this morning.  Mean AV gradient stable at 13 mmHg.  6.  Mixed hyperlipidemia, LDL 61 in February.  Continue Lipitor.  Disposition:  Follow up  6 months.  Signed, Jonelle Sidle, M.D., F.A.C.C.  [facility]

## 2022-12-08 ENCOUNTER — Ambulatory Visit: Payer: Medicare Other | Attending: Cardiology | Admitting: Cardiology

## 2022-12-08 ENCOUNTER — Encounter: Payer: Self-pay | Admitting: Cardiology

## 2022-12-08 ENCOUNTER — Ambulatory Visit (INDEPENDENT_AMBULATORY_CARE_PROVIDER_SITE_OTHER): Payer: Medicare Other

## 2022-12-08 VITALS — BP 138/70 | HR 62 | Ht 67.0 in | Wt 232.8 lb

## 2022-12-08 DIAGNOSIS — E782 Mixed hyperlipidemia: Secondary | ICD-10-CM

## 2022-12-08 DIAGNOSIS — I4821 Permanent atrial fibrillation: Secondary | ICD-10-CM

## 2022-12-08 DIAGNOSIS — I255 Ischemic cardiomyopathy: Secondary | ICD-10-CM | POA: Diagnosis not present

## 2022-12-08 DIAGNOSIS — I25119 Atherosclerotic heart disease of native coronary artery with unspecified angina pectoris: Secondary | ICD-10-CM | POA: Diagnosis not present

## 2022-12-08 DIAGNOSIS — I350 Nonrheumatic aortic (valve) stenosis: Secondary | ICD-10-CM | POA: Diagnosis not present

## 2022-12-08 LAB — ECHOCARDIOGRAM COMPLETE
AR max vel: 1.46 cm2
AV Area VTI: 1.49 cm2
AV Area mean vel: 1.46 cm2
AV Mean grad: 13 mmHg
AV Peak grad: 23.8 mmHg
Ao pk vel: 2.44 m/s
Area-P 1/2: 3.6 cm2
Calc EF: 51.6 %
S' Lateral: 4.8 cm
Single Plane A2C EF: 58.8 %
Single Plane A4C EF: 47 %

## 2022-12-08 MED ORDER — FUROSEMIDE 40 MG PO TABS: 40.0000 mg | ORAL_TABLET | Freq: Every day | ORAL | 1 refills | Status: DC | PRN

## 2022-12-08 MED ORDER — POTASSIUM CHLORIDE ER 15 MEQ PO TBCR: 2.0000 | EXTENDED_RELEASE_TABLET | Freq: Every day | ORAL | 1 refills | Status: DC | PRN

## 2022-12-08 NOTE — Patient Instructions (Addendum)
Medication Instructions:  Your physician has recommended you make the following change in your medication:  Change furosemide to 40 meq daily for swelling or weight gain and potassium to 30 meq daily as needed with furosemide Continue all other medications as prescribed  Labwork: none  Testing/Procedures: none  Follow-Up: Your physician recommends that you schedule a follow-up appointment in: 6 months  Any Other Special Instructions Will Be Listed Below (If Applicable).  If you need a refill on your cardiac medications before your next appointment, please call your pharmacy.

## 2022-12-11 ENCOUNTER — Other Ambulatory Visit: Payer: Self-pay | Admitting: Cardiology

## 2022-12-18 DIAGNOSIS — Z79899 Other long term (current) drug therapy: Secondary | ICD-10-CM | POA: Diagnosis not present

## 2022-12-18 DIAGNOSIS — I2510 Atherosclerotic heart disease of native coronary artery without angina pectoris: Secondary | ICD-10-CM | POA: Diagnosis not present

## 2022-12-18 DIAGNOSIS — I10 Essential (primary) hypertension: Secondary | ICD-10-CM | POA: Diagnosis not present

## 2022-12-18 DIAGNOSIS — Z87891 Personal history of nicotine dependence: Secondary | ICD-10-CM | POA: Diagnosis not present

## 2022-12-18 DIAGNOSIS — I509 Heart failure, unspecified: Secondary | ICD-10-CM | POA: Diagnosis not present

## 2022-12-18 DIAGNOSIS — Z713 Dietary counseling and surveillance: Secondary | ICD-10-CM | POA: Diagnosis not present

## 2022-12-18 DIAGNOSIS — S81812A Laceration without foreign body, left lower leg, initial encounter: Secondary | ICD-10-CM | POA: Diagnosis not present

## 2022-12-18 DIAGNOSIS — I110 Hypertensive heart disease with heart failure: Secondary | ICD-10-CM | POA: Diagnosis not present

## 2022-12-18 DIAGNOSIS — X58XXXA Exposure to other specified factors, initial encounter: Secondary | ICD-10-CM | POA: Diagnosis not present

## 2022-12-18 DIAGNOSIS — T148XXA Other injury of unspecified body region, initial encounter: Secondary | ICD-10-CM | POA: Diagnosis not present

## 2023-01-02 DIAGNOSIS — I10 Essential (primary) hypertension: Secondary | ICD-10-CM | POA: Diagnosis not present

## 2023-01-02 DIAGNOSIS — T148XXA Other injury of unspecified body region, initial encounter: Secondary | ICD-10-CM | POA: Diagnosis not present

## 2023-01-02 DIAGNOSIS — Z713 Dietary counseling and surveillance: Secondary | ICD-10-CM | POA: Diagnosis not present

## 2023-01-02 DIAGNOSIS — Z87891 Personal history of nicotine dependence: Secondary | ICD-10-CM | POA: Diagnosis not present

## 2023-01-02 DIAGNOSIS — W19XXXD Unspecified fall, subsequent encounter: Secondary | ICD-10-CM | POA: Diagnosis not present

## 2023-01-02 DIAGNOSIS — Z7182 Exercise counseling: Secondary | ICD-10-CM | POA: Diagnosis not present

## 2023-01-02 DIAGNOSIS — S81812D Laceration without foreign body, left lower leg, subsequent encounter: Secondary | ICD-10-CM | POA: Diagnosis not present

## 2023-01-02 DIAGNOSIS — Z79899 Other long term (current) drug therapy: Secondary | ICD-10-CM | POA: Diagnosis not present

## 2023-01-11 ENCOUNTER — Other Ambulatory Visit: Payer: Self-pay | Admitting: Cardiology

## 2023-01-11 DIAGNOSIS — I4821 Permanent atrial fibrillation: Secondary | ICD-10-CM

## 2023-01-12 NOTE — Telephone Encounter (Signed)
Prescription refill request for Eliquis received. Indication: a fib Last office visit: 12/08/22 Scr: 0.84 07/30/22 Age: 67 Weight: 105kg

## 2023-01-19 DIAGNOSIS — H47011 Ischemic optic neuropathy, right eye: Secondary | ICD-10-CM | POA: Diagnosis not present

## 2023-01-19 DIAGNOSIS — H25813 Combined forms of age-related cataract, bilateral: Secondary | ICD-10-CM | POA: Diagnosis not present

## 2023-01-26 DIAGNOSIS — G4733 Obstructive sleep apnea (adult) (pediatric): Secondary | ICD-10-CM | POA: Diagnosis not present

## 2023-01-27 DIAGNOSIS — D044 Carcinoma in situ of skin of scalp and neck: Secondary | ICD-10-CM | POA: Diagnosis not present

## 2023-01-28 DIAGNOSIS — E782 Mixed hyperlipidemia: Secondary | ICD-10-CM | POA: Diagnosis not present

## 2023-01-28 DIAGNOSIS — R7301 Impaired fasting glucose: Secondary | ICD-10-CM | POA: Diagnosis not present

## 2023-02-03 DIAGNOSIS — I2510 Atherosclerotic heart disease of native coronary artery without angina pectoris: Secondary | ICD-10-CM | POA: Diagnosis not present

## 2023-02-03 DIAGNOSIS — S81812D Laceration without foreign body, left lower leg, subsequent encounter: Secondary | ICD-10-CM | POA: Diagnosis not present

## 2023-02-03 DIAGNOSIS — M545 Low back pain, unspecified: Secondary | ICD-10-CM | POA: Diagnosis not present

## 2023-02-03 DIAGNOSIS — I10 Essential (primary) hypertension: Secondary | ICD-10-CM | POA: Diagnosis not present

## 2023-02-03 DIAGNOSIS — I4891 Unspecified atrial fibrillation: Secondary | ICD-10-CM | POA: Diagnosis not present

## 2023-02-03 DIAGNOSIS — I509 Heart failure, unspecified: Secondary | ICD-10-CM | POA: Diagnosis not present

## 2023-02-03 DIAGNOSIS — J90 Pleural effusion, not elsewhere classified: Secondary | ICD-10-CM | POA: Diagnosis not present

## 2023-02-03 DIAGNOSIS — D509 Iron deficiency anemia, unspecified: Secondary | ICD-10-CM | POA: Diagnosis not present

## 2023-02-03 DIAGNOSIS — R7301 Impaired fasting glucose: Secondary | ICD-10-CM | POA: Diagnosis not present

## 2023-02-03 DIAGNOSIS — I110 Hypertensive heart disease with heart failure: Secondary | ICD-10-CM | POA: Diagnosis not present

## 2023-02-03 DIAGNOSIS — G4733 Obstructive sleep apnea (adult) (pediatric): Secondary | ICD-10-CM | POA: Diagnosis not present

## 2023-02-05 ENCOUNTER — Other Ambulatory Visit: Payer: Self-pay | Admitting: Cardiology

## 2023-02-11 ENCOUNTER — Telehealth: Payer: Self-pay | Admitting: Cardiology

## 2023-02-11 NOTE — Telephone Encounter (Signed)
Patient states jury duty is Martin [name] and he cannot walk a long distance without being SOB and he is requesting Dr.McDowell write him a note to exempt him if possible.

## 2023-02-11 NOTE — Telephone Encounter (Signed)
Pt would like the doctor to write him a note for [employer]. Please advise

## 2023-02-12 NOTE — Telephone Encounter (Signed)
My Chart message sent

## 2023-02-25 DIAGNOSIS — Z961 Presence of intraocular lens: Secondary | ICD-10-CM | POA: Diagnosis not present

## 2023-03-16 DIAGNOSIS — L570 Actinic keratosis: Secondary | ICD-10-CM | POA: Diagnosis not present

## 2023-03-16 DIAGNOSIS — D224 Melanocytic nevi of scalp and neck: Secondary | ICD-10-CM | POA: Diagnosis not present

## 2023-03-16 DIAGNOSIS — Z85828 Personal history of other malignant neoplasm of skin: Secondary | ICD-10-CM | POA: Diagnosis not present

## 2023-03-16 DIAGNOSIS — Z08 Encounter for follow-up examination after completed treatment for malignant neoplasm: Secondary | ICD-10-CM | POA: Diagnosis not present

## 2023-03-16 DIAGNOSIS — D485 Neoplasm of uncertain behavior of skin: Secondary | ICD-10-CM | POA: Diagnosis not present

## 2023-03-16 DIAGNOSIS — X32XXXD Exposure to sunlight, subsequent encounter: Secondary | ICD-10-CM | POA: Diagnosis not present

## 2023-04-06 DIAGNOSIS — Z79899 Other long term (current) drug therapy: Secondary | ICD-10-CM | POA: Diagnosis not present

## 2023-04-06 DIAGNOSIS — S81812A Laceration without foreign body, left lower leg, initial encounter: Secondary | ICD-10-CM | POA: Diagnosis not present

## 2023-04-06 DIAGNOSIS — Z87891 Personal history of nicotine dependence: Secondary | ICD-10-CM | POA: Diagnosis not present

## 2023-04-06 DIAGNOSIS — T148XXA Other injury of unspecified body region, initial encounter: Secondary | ICD-10-CM | POA: Diagnosis not present

## 2023-04-06 DIAGNOSIS — I10 Essential (primary) hypertension: Secondary | ICD-10-CM | POA: Diagnosis not present

## 2023-04-06 DIAGNOSIS — Z713 Dietary counseling and surveillance: Secondary | ICD-10-CM | POA: Diagnosis not present

## 2023-04-06 DIAGNOSIS — X58XXXA Exposure to other specified factors, initial encounter: Secondary | ICD-10-CM | POA: Diagnosis not present

## 2023-04-08 ENCOUNTER — Ambulatory Visit (HOSPITAL_BASED_OUTPATIENT_CLINIC_OR_DEPARTMENT_OTHER): Payer: Medicare Other | Admitting: Pulmonary Disease

## 2023-04-12 ENCOUNTER — Other Ambulatory Visit: Payer: Self-pay | Admitting: Cardiology

## 2023-04-30 ENCOUNTER — Encounter: Payer: Self-pay | Admitting: Cardiology

## 2023-05-03 ENCOUNTER — Other Ambulatory Visit: Payer: Self-pay | Admitting: Cardiology

## 2023-05-17 ENCOUNTER — Other Ambulatory Visit: Payer: Self-pay | Admitting: Cardiology

## 2023-05-17 DIAGNOSIS — I4821 Permanent atrial fibrillation: Secondary | ICD-10-CM

## 2023-05-17 NOTE — Telephone Encounter (Signed)
Prescription refill request for Eliquis received. Indication: AF Last office visit: 12/08/22  Ival Bible MD Scr: 0.75 on 01/28/23  Epic Age: 67 Weight: 105.6kg  Based on above findings Eliquis 5mg  twice daily is the appropriate dose.  Refill approved.

## 2023-05-26 ENCOUNTER — Telehealth (HOSPITAL_BASED_OUTPATIENT_CLINIC_OR_DEPARTMENT_OTHER): Payer: Self-pay | Admitting: Pulmonary Disease

## 2023-05-26 ENCOUNTER — Encounter (HOSPITAL_BASED_OUTPATIENT_CLINIC_OR_DEPARTMENT_OTHER): Payer: Self-pay | Admitting: Pulmonary Disease

## 2023-05-26 ENCOUNTER — Ambulatory Visit (HOSPITAL_BASED_OUTPATIENT_CLINIC_OR_DEPARTMENT_OTHER): Payer: Medicare Other

## 2023-05-26 ENCOUNTER — Ambulatory Visit (HOSPITAL_BASED_OUTPATIENT_CLINIC_OR_DEPARTMENT_OTHER): Payer: Medicare Other | Admitting: Pulmonary Disease

## 2023-05-26 VITALS — BP 118/72 | HR 75 | Resp 14 | Ht 67.0 in | Wt 231.6 lb

## 2023-05-26 DIAGNOSIS — I517 Cardiomegaly: Secondary | ICD-10-CM | POA: Diagnosis not present

## 2023-05-26 DIAGNOSIS — R918 Other nonspecific abnormal finding of lung field: Secondary | ICD-10-CM | POA: Diagnosis not present

## 2023-05-26 DIAGNOSIS — G4733 Obstructive sleep apnea (adult) (pediatric): Secondary | ICD-10-CM | POA: Diagnosis not present

## 2023-05-26 DIAGNOSIS — J90 Pleural effusion, not elsewhere classified: Secondary | ICD-10-CM

## 2023-05-26 NOTE — Telephone Encounter (Signed)
Chest x-ray continues to show small amount of fluid as before, formed a pocket on the left side. Can obtain CT chest to reassess and compare with previous from last year if he is willing to proceed

## 2023-05-26 NOTE — Patient Instructions (Addendum)
X CXR today for pleural effusion  CPAP is working well

## 2023-05-26 NOTE — Progress Notes (Signed)
Subjective:    Patient ID: Martin [name], male    DOB: [birth year], 67 y.o.   MRN: [id number]  HPI  67 yo  yo obese man with CAD for FU of obstructive sleep apnea and chronic pleural effusions -on CPAP since 2001 He got a new CPAP in 2017 12 cm   PMH - CABG 2020 ,CVA permanent atrial fibrillation (dx 10/2017),  chronic HFpEF (EF 50-55%),   habitual alcohol use, prior street use of THC/Vicodin CVA 03/2019, residual left lower extremity weakness Wife had ALS , passed 06/2021 He smoked 25 PY before he quit 2001   He presented with persistent right effusion which dates back to April 2023,chest x-ray from 05/2019 appears clear CT chest confirmed right lower lobe infiltrate with air bronchograms, very atypical rounded appearance appearance. He had fluid in esophagus so this could possibly be aspiration pneumonia   Chest x-ray from 08/2022 showed improved no effusions?  Loculation.  17-month follow-up visit. Dyspnea is at baseline.  Reviewed cardiology follow-up visit.  Weight is stable decreased 6 pounds, he is compliant with diuretics Chest x-ray obtained and to my review shows similar small effusions including left loculated  He reports compliance with his CPAP machine, denies any problems with mask or pressure  Discussed the use of AI scribe software for clinical note transcription with the patient, who gave verbal consent to proceed.  History of Present Illness   The patient, with a history of sleep apnea and pleural effusion, reports no recent weight gain and believes the fluid around his lungs has resolved. He has been managing his sleep apnea with a CPAP machine, which he uses nightly. However, he reports some issues with the mask leaking and believes it may be time for a replacement. He has not been taking Lasix, as he has not noticed any weight gain and does not want to increase his frequency of urination. He has been attending cardiac rehab for three years and feels his heart is  doing well. He has also been trying to lose weight and has lost six pounds in the past year. He is currently undergoing mouth surgery, which has affected his ability to eat. He has been a non-smoker for 24 years.      CPAP download shows excellent control of events on 12 cm with mild leak.  He is very compliant more than 8 hours per night CPAP certainly improved improve his daytime somnolence and fatigue Significant tests/ events reviewed 01/29/22 RT thora >> 550cc -lymphocytic exudate, cytology neg   CT chest wo con 01/2022 >>moderate to large sized infiltrate in right lower lobe .Small to moderate right pleural effusion. Small left pleural effusion.   NPSG 04/2016 at [facility] showed AHI of 29/hour with lowest desaturation of 86%  Review of Systems neg for any significant sore throat, dysphagia, itching, sneezing, nasal congestion or excess/ purulent secretions, fever, chills, sweats, unintended wt loss, pleuritic or exertional cp, hempoptysis, orthopnea pnd or change in chronic leg swelling. Also denies presyncope, palpitations, heartburn, abdominal pain, nausea, vomiting, diarrhea or change in bowel or urinary habits, dysuria,hematuria, rash, arthralgias, visual complaints, headache, numbness weakness or ataxia.     Objective:   Physical Exam  Gen. Pleasant, obese, in no distress ENT - no lesions, no post nasal drip Neck: No JVD, no thyromegaly, no carotid bruits Lungs: no use of accessory muscles, no dullness to percussion, decreased without rales or rhonchi  Cardiovascular: Rhythm regular, heart sounds  normal, no murmurs or gallops, no peripheral  edema Musculoskeletal: No deformities, no cyanosis or clubbing , no tremors        Assessment & Plan:     Assessment and Plan    Obstructive Sleep Apnea Managed with CPAP at 12 cm H2O, good compliance, low event rate. Issues with mask leakage affecting sleep quality. Discussed alternative masks and maintenance. Prefers new mask  type. - Renew CPAP supplies - Consider new mask type - Educate on mask maintenance  Pleural Effusion Previous fluid drainage, recent chest x-ray shows minimal residual fluid on right, scarring on left. No significant weight gain, stable breathing. Plan to confirm status with chest x-ray today. - Order chest x-ray - Consider repeat scan if residual fluid present  Heart Failure Ongoing management with Entresto, Aldactone, and cardiac rehab. Well-managed condition, regular cardiologist follow-ups. Monitoring weight and symptoms for fluid retention. - Continue current medications - Continue cardiac rehab - Follow up with cardiologist in January  General Health Maintenance Smoke-free for 24 years. Recent 6-pound weight loss due to dietary changes and dental issues. - Encourage continued weight management - Monitor weight changes  Follow-up - Schedule follow-up appointment in six months.

## 2023-05-27 NOTE — Telephone Encounter (Signed)
Results have been relayed to the patient/authorized caretaker. The patient/authorized caretaker verbalized understanding. No questions at this time.  Patient is agreeable. please order CT.

## 2023-06-04 DIAGNOSIS — M109 Gout, unspecified: Secondary | ICD-10-CM | POA: Diagnosis not present

## 2023-06-15 ENCOUNTER — Other Ambulatory Visit (HOSPITAL_BASED_OUTPATIENT_CLINIC_OR_DEPARTMENT_OTHER): Payer: Medicare Other

## 2023-06-15 ENCOUNTER — Ambulatory Visit (HOSPITAL_BASED_OUTPATIENT_CLINIC_OR_DEPARTMENT_OTHER): Payer: Medicare Other | Attending: Nurse Practitioner | Admitting: Nurse Practitioner

## 2023-06-15 ENCOUNTER — Telehealth: Payer: Self-pay | Admitting: Adult Health

## 2023-06-15 ENCOUNTER — Encounter: Payer: Self-pay | Admitting: Nurse Practitioner

## 2023-06-15 VITALS — BP 130/80 | HR 74 | Ht 66.0 in | Wt 231.8 lb

## 2023-06-15 DIAGNOSIS — I255 Ischemic cardiomyopathy: Secondary | ICD-10-CM

## 2023-06-15 DIAGNOSIS — I350 Nonrheumatic aortic (valve) stenosis: Secondary | ICD-10-CM

## 2023-06-15 DIAGNOSIS — E669 Obesity, unspecified: Secondary | ICD-10-CM

## 2023-06-15 DIAGNOSIS — E785 Hyperlipidemia, unspecified: Secondary | ICD-10-CM | POA: Diagnosis not present

## 2023-06-15 DIAGNOSIS — I4821 Permanent atrial fibrillation: Secondary | ICD-10-CM | POA: Diagnosis not present

## 2023-06-15 DIAGNOSIS — I2510 Atherosclerotic heart disease of native coronary artery without angina pectoris: Secondary | ICD-10-CM | POA: Diagnosis not present

## 2023-06-15 DIAGNOSIS — I10 Essential (primary) hypertension: Secondary | ICD-10-CM | POA: Diagnosis not present

## 2023-06-15 NOTE — Progress Notes (Signed)
 Cardiology Office Note:  .   Date:  06/15/2023  ID:  Martin [name], DOB May 05, 1956, MRN [id number] PCP: Shona Norleen PEDLAR, MD  [facility] Providers Cardiologist:  Jayson Sierras, MD Electrophysiologist:  Danelle Birmingham, MD    History of Present Illness: .   Martin [name] is a 68 y.o. male with a PMH of CAD, ischemic cardiomyopathy, permanent A-fib, HTN, mild aortic valve stenosis, hx of symptomatic bradycardia, and mixed HLD, who presents today for 6 month follow-up.   Hx of angioplasty of LAD in 2001, stent interventions to RCA and LAD in 2011, and underwent CABG in 2020 (SVG - ramus intermedius, LIMA-LAD, SVG-PDA).   Last seen by Dr. Sierras on December 08, 2022. Was overall doing well at the time.   Today he presents for 6 month follow-up. He states he is doing very well and denies any acute cardiac complaints or concerns today.  He says he does cardio about 3 days/week, planning to become more active this year. Denies any chest pain, shortness of breath, palpitations, syncope, presyncope, dizziness, orthopnea, PND, swelling or significant weight changes, acute bleeding, or claudication.  ROS: Negative. See HPI.   Studies Reviewed: .    Echo 12/2022:   1. Left ventricular ejection fraction, by estimation, is 50 to 55%. The  left ventricle has low normal function. Left ventricular endocardial  border not optimally defined to evaluate regional wall motion. The left ventricular internal cavity size was mildly dilated. There is mild concentric left ventricular hypertrophy. Left ventricular diastolic parameters are indeterminate.   2. Right ventricular systolic function is normal. The right ventricular size is mildly enlarged. Tricuspid regurgitation signal is inadequate for assessing PA pressure.   3. Left atrial size was moderately dilated.   4. Right atrial size was moderately dilated.   5. The mitral valve is grossly normal. Trivial mitral valve  regurgitation.   6. The aortic  valve is tricuspid. There is mild calcification of the  aortic valve. Aortic valve regurgitation is not visualized. Mild aortic  valve stenosis. Aortic valve area, by VTI measures 1.49 cm. Aortic valve mean gradient measures 13.0 mmHg.   7. The inferior vena cava is normal in size with greater than 50%  respiratory variability, suggesting right atrial pressure of 3 mmHg.   Comparison(s): Prior images reviewed side by side. LVEF low normal at 50-55% at this time, difficult to assess focal wall motion. Mild calcific aortic stenosis with stable mean gradient.  Lexiscan  11/2021:    Findings are consistent with prior myocardial infarction. The study is low risk.   No ST deviation was noted.   LV perfusion is abnormal. There is no evidence of ischemia. There is evidence of infarction. Defect 1: There is a small defect with mild reduction in uptake present in the apical to basal inferior location(s) that is fixed. There is abnormal wall motion in the defect area. Consistent with infarction.   Left ventricular function is abnormal. Global function is moderately reduced. End diastolic cavity size is normal. End systolic cavity size is normal.   Small inferior wall infarct from apex to base no ischemia EF estimated at 42% but patient in afib   LHC 02/2019:  Severe three-vessel coronary calcification Patent left main Eccentric ostial 50 to 75% LAD with mid vessel 40 to 50% narrowing.  Mid to distal LAD stent is widely patent. Ramus intermedius contains proximal 90% stenosis. Circumflex contains 99% proximal/ostial stenosis. RCA is heavily calcified and stented.  The artery is totally  occluded in the proximal to mid segment.  Left-to-right collaterals are noted. Mildly reduced LV systolic function with EF estimated to be 40%.  LVEDP is 28 mmHg.  Findings compatible with acute on chronic combined systolic and diastolic heart failure.   RECOMMENDATIONS:   IV nitroglycerin  is added to help control the  patient's blood pressure and lower LVEDP. Low-dose beta-blocker therapy in the form of metoprolol  25 mg twice daily is added.  I have held diltiazem  given LV function. IV heparin  will be resumed. Surgical consultation is requested.      Physical Exam:   VS:  BP 130/80   Pulse 74   Ht 5' 6 (1.676 m)   Wt 231 lb 12.8 oz (105.1 kg)   SpO2 93%   BMI 37.41 kg/m    Wt Readings from Last 3 Encounters:  06/15/23 231 lb 12.8 oz (105.1 kg)  05/26/23 231 lb 9.6 oz (105.1 kg)  12/08/22 232 lb 12.8 oz (105.6 kg)    GEN: Obese, 68 year old male in no acute distress NECK: No JVD; No carotid bruits CARDIAC: S1/S2, irregularly irregular rhythm, no murmurs, rubs, gallops RESPIRATORY:  Clear to auscultation without rales, wheezing or rhonchi  ABDOMEN: Soft, non-tender, non-distended EXTREMITIES:  No edema; No deformity   ASSESSMENT AND PLAN: .    CAD, s/p CABG in 2020 Stable with no anginal symptoms. No indication for ischemic evaluation.  Not on aspirin  due to being on Eliquis .  Continue atorvastatin  and nitroglycerin  as needed. Heart healthy diet and regular cardiovascular exercise encouraged.   2. Permanent A-fib Denies any tachycardia or palpitations.  Heart rate is well-controlled, has not required AV nodal blockers.  Previous history of symptomatic bradycardia.  Continue Eliquis  for stroke prevention, denies any bleeding issues.  He is on appropriate dosage. No medication changes at this time.  Will provide him assistance with Eliquis  due to cost.  3. Ischemic cardiomyopathy Stage C, NYHA class I symptoms. EF 50 to 55%.  Euvolemic well compensated on exam.  Continue Entresto , Lasix  as needed, Jardiance , spironolactone , and potassium supplement. Low sodium diet, fluid restriction <2L, and daily weights encouraged. Educated to contact our office for weight gain of 2 lbs overnight or 5 lbs in one week.  4. HTN BP stable and at goal. Discussed to monitor BP at home at least 2 hours after  medications and sitting for 5-10 minutes.  No medication changes at this time. Heart healthy diet and regular cardiovascular exercise encouraged.   5. HLD Labs faxed from PCPs office last August 2024 revealed LDL 56.  Discussed LDL goal is less than 55.  He is on max dose of atorvastatin -discussed medication adjustment, patient politely declines at this time. No medication changes at this time. Heart healthy diet and regular cardiovascular exercise encouraged.   6. Obesity Weight loss via diet and exercise encouraged. Discussed the impact being overweight would have on cardiovascular risk.  7. Mild aortic valve stenosis Noted on echocardiogram in July 2024, mean gradient 13.0 mmHg.  He is asymptomatic with this.  Will update echocardiogram in July 2025.   Dispo: Follow-up with me/APP in 6 months or sooner if anything changes.  Signed, Almarie Crate, NP

## 2023-06-15 NOTE — Patient Instructions (Addendum)

## 2023-06-15 NOTE — Telephone Encounter (Signed)
 After clinic call, questions about CT chest appointment at [facility] today . Tried to call x 4 , no answer. Straight to voicemail. [id number]. Sent my chart message. No answer.  Please check to see if this needs to be rescheduled.

## 2023-06-18 ENCOUNTER — Ambulatory Visit (HOSPITAL_COMMUNITY): Admission: RE | Admit: 2023-06-18 | Payer: Medicare Other | Source: Ambulatory Visit

## 2023-06-18 ENCOUNTER — Ambulatory Visit (HOSPITAL_COMMUNITY)
Admission: RE | Admit: 2023-06-18 | Discharge: 2023-06-18 | Disposition: A | Discharge status: Home / Self Care | Payer: Medicare Other | Source: Ambulatory Visit | Attending: Pulmonary Disease | Admitting: Pulmonary Disease

## 2023-06-18 DIAGNOSIS — J90 Pleural effusion, not elsewhere classified: Secondary | ICD-10-CM | POA: Diagnosis not present

## 2023-06-18 DIAGNOSIS — J9811 Atelectasis: Secondary | ICD-10-CM | POA: Diagnosis not present

## 2023-06-22 ENCOUNTER — Telehealth (HOSPITAL_BASED_OUTPATIENT_CLINIC_OR_DEPARTMENT_OTHER): Payer: Self-pay | Admitting: Pulmonary Disease

## 2023-06-22 ENCOUNTER — Encounter (HOSPITAL_BASED_OUTPATIENT_CLINIC_OR_DEPARTMENT_OTHER): Payer: Self-pay

## 2023-06-22 DIAGNOSIS — G4733 Obstructive sleep apnea (adult) (pediatric): Secondary | ICD-10-CM

## 2023-06-22 NOTE — Telephone Encounter (Signed)
 Patient messaged clerical pool through [place].   Per patient: I need Dr.Alva to call about my Cpap supplies   Please advise and message patient back through mychart. Patient is aware I have notified the clinical staff.

## 2023-06-28 ENCOUNTER — Encounter (HOSPITAL_BASED_OUTPATIENT_CLINIC_OR_DEPARTMENT_OTHER): Payer: Self-pay | Admitting: Pulmonary Disease

## 2023-07-09 ENCOUNTER — Encounter: Payer: Self-pay | Admitting: *Deleted

## 2023-07-09 NOTE — Progress Notes (Signed)
Fax notification received from [facility] that once $1063.00 (3% out of pocket prescription expense requirement) is met and received, eliquis will be provided free of charge. Mychart message sent to patient notifying him of this requirement.

## 2023-07-13 ENCOUNTER — Telehealth: Payer: Self-pay | Admitting: Cardiology

## 2023-07-13 NOTE — Telephone Encounter (Signed)
 Pt c/o medication issue:  1. Name of Medication:   JARDIANCE  10 MG TABS tablet    2. How are you currently taking this medication (dosage and times per day)? As prescribed   3. Are you having a reaction (difficulty breathing--STAT)? Yes  4. What is your medication issue? Patient is calling stating he has a rash on his genital area that started 2 months ago that he believes is being caused by this medication. Please advise.

## 2023-07-14 NOTE — Telephone Encounter (Signed)
 Spoke with patient regards to rash with taking Jardiance . He reports that they are little red bumps on his private area doesn't hurt, but does swell up sometimes. Currently still taking this medication, but read this is one of the side affects. Please advise

## 2023-07-14 NOTE — Telephone Encounter (Signed)
 Per Dr. Londa Rival:  Have him stop Jardiance .   Patient verbalized understanding will stop medication at this time.

## 2023-07-16 NOTE — Telephone Encounter (Signed)
I have faxed the referral, notes, sleep study to [facility]

## 2023-07-22 ENCOUNTER — Telehealth: Payer: Self-pay

## 2023-07-22 NOTE — Telephone Encounter (Signed)
Pharmacy please advise on holding Eliquis prior to dental extraction scheduled for 07/26/2023. Thank you.

## 2023-07-22 NOTE — Telephone Encounter (Signed)
Patient with diagnosis of afib on Eliquis for anticoagulation.    Procedure: Dental Extraction - Amount of Teeth to be Pulled:  Gingival polyp and implant debridement teeth #14,11,12  Date of procedure: 07/26/2023   CHA2DS2-VASc Score = 6   This indicates a 9.7% annual risk of stroke. The patient's score is based upon: CHF History: 1 HTN History: 1 Diabetes History: 0 Stroke History: 2 Vascular Disease History: 1 Age Score: 1 Gender Score: 0     CrCl >100 mL/min Platelet count 159 K  Patient does not require pre-op antibiotics for dental procedure.  Per office protocol, patient can hold Eliquis for 1 days prior to procedure.  Due to hx of stroke with Afib pt is at high risk off of anticoagulant    **This guidance is not considered finalized until pre-operative APP has relayed final recommendations.**

## 2023-07-22 NOTE — Telephone Encounter (Signed)
   Pre-operative Risk Assessment    Patient Name: Martin [name]  DOB: 1956/02/24 MRN: [id number]   Date of last office visit: 06/25/23 Date of next office visit: 12/16/23  Request for Surgical Clearance    Procedure:  Dental Extraction - Amount of Teeth to be Pulled:  Gingival polyp and implant debridement teeth #14,11,12   Date of Surgery:  Clearance 07/26/23                                 Surgeon:  Not indicated  Surgeon's Group or Practice Name:  [facility]  Phone number:  [phone] Fax number:  [phone]   Type of Clearance Requested:   - Medical  - Pharmacy:  Hold Apixaban (Eliquis) Not indicated    Type of Anesthesia:  Not Indicated   Additional requests/questions:    Martin [name]   07/22/2023, 9:27 AM

## 2023-07-22 NOTE — Progress Notes (Signed)
  Martin [name] perioperative risk of a major cardiac event is 6.6% according to the Revised Cardiac Risk Index (RCRI).  Therefore, he is at high risk for perioperative complications.   His functional capacity is excellent at > 4 METs according to the Duke Activity Status Index (DASI). Recommendations: According to ACC/AHA guidelines, no further cardiovascular testing needed.  The patient may proceed to surgery at acceptable risk.   Antiplatelet and/or Anticoagulation Recommendations: Patient does not require pre-op antibiotics for dental procedure. Per office protocol, patient can hold Eliquis for 1 days prior to procedure.  Due to hx of stroke with Afib pt is at high risk off of anticoagulant.   Will route note to requesting party.   Sharlene Dory, NP

## 2023-07-27 ENCOUNTER — Emergency Department (HOSPITAL_COMMUNITY)
Admission: EM | Admit: 2023-07-27 | Discharge: 2023-07-28 | Discharge status: Against Medical Advice | Payer: Medicare Other | Attending: Emergency Medicine | Admitting: Emergency Medicine

## 2023-07-27 ENCOUNTER — Other Ambulatory Visit: Payer: Self-pay

## 2023-07-27 ENCOUNTER — Encounter (HOSPITAL_COMMUNITY): Payer: Self-pay

## 2023-07-27 DIAGNOSIS — Z87891 Personal history of nicotine dependence: Secondary | ICD-10-CM | POA: Diagnosis not present

## 2023-07-27 DIAGNOSIS — K1379 Other lesions of oral mucosa: Secondary | ICD-10-CM | POA: Diagnosis not present

## 2023-07-27 DIAGNOSIS — Z5321 Procedure and treatment not carried out due to patient leaving prior to being seen by health care provider: Secondary | ICD-10-CM | POA: Diagnosis not present

## 2023-07-27 DIAGNOSIS — I10 Essential (primary) hypertension: Secondary | ICD-10-CM | POA: Diagnosis not present

## 2023-07-27 DIAGNOSIS — K068 Other specified disorders of gingiva and edentulous alveolar ridge: Secondary | ICD-10-CM | POA: Insufficient documentation

## 2023-07-27 DIAGNOSIS — R030 Elevated blood-pressure reading, without diagnosis of hypertension: Secondary | ICD-10-CM | POA: Diagnosis not present

## 2023-07-27 NOTE — ED Triage Notes (Signed)
Pt presents with dental bleeding that started this morning that he is not able to get to stop. Pt is on a blood thinner. Pt had procedure last Tuesday. Denies pain. Gauze placed in pt mouth.

## 2023-07-28 DIAGNOSIS — Z87891 Personal history of nicotine dependence: Secondary | ICD-10-CM | POA: Diagnosis not present

## 2023-07-28 DIAGNOSIS — Z7901 Long term (current) use of anticoagulants: Secondary | ICD-10-CM | POA: Diagnosis not present

## 2023-07-28 DIAGNOSIS — Z880 Allergy status to penicillin: Secondary | ICD-10-CM | POA: Diagnosis not present

## 2023-07-28 DIAGNOSIS — Z79899 Other long term (current) drug therapy: Secondary | ICD-10-CM | POA: Diagnosis not present

## 2023-07-28 DIAGNOSIS — E785 Hyperlipidemia, unspecified: Secondary | ICD-10-CM | POA: Diagnosis not present

## 2023-07-28 DIAGNOSIS — K1379 Other lesions of oral mucosa: Secondary | ICD-10-CM | POA: Diagnosis not present

## 2023-07-28 DIAGNOSIS — I10 Essential (primary) hypertension: Secondary | ICD-10-CM | POA: Diagnosis not present

## 2023-08-11 DIAGNOSIS — E782 Mixed hyperlipidemia: Secondary | ICD-10-CM | POA: Diagnosis not present

## 2023-08-11 DIAGNOSIS — R7301 Impaired fasting glucose: Secondary | ICD-10-CM | POA: Diagnosis not present

## 2023-08-16 ENCOUNTER — Telehealth (HOSPITAL_BASED_OUTPATIENT_CLINIC_OR_DEPARTMENT_OTHER): Payer: Self-pay | Admitting: Pulmonary Disease

## 2023-08-16 DIAGNOSIS — G4733 Obstructive sleep apnea (adult) (pediatric): Secondary | ICD-10-CM

## 2023-08-16 NOTE — Telephone Encounter (Signed)
 Can pt complete HST from [place] instead of [employer]?  No pa req per [place]   Please advise, pt's CPAP is broken he has been using his wifes, Needs HST to process new CPAP order

## 2023-08-16 NOTE — Telephone Encounter (Signed)
 Patient states spoke with insurance and they stated in order to receive new CPAP machine patient will need an updated HST. Please advise.

## 2023-08-16 NOTE — Telephone Encounter (Signed)
 Spoke with [employer]. He is currently using his wifes cpap machine and it is 71 years old. Current dme company is [employer]. Pt complains he has not been sleeping well since using his wifes cpap machine because the pressure is different. Cpap machine broke last wk. Pt spoke with dme company this am and they will need an order for him regarding his settings to be able to use his wifes cpap. Pt is aware that he will need an updated HST and would like to get it scheduled asap.

## 2023-08-16 NOTE — Telephone Encounter (Signed)
 Per [employer] message sent yesterday: My cpap machine stopped working wanted to see if you could get help from insurance company about replacement   Please advise if patient is eligible for a new machine and call/ message patient back

## 2023-08-16 NOTE — Telephone Encounter (Signed)
 Is it okay to order HST looks like last one in 2017

## 2023-08-16 NOTE — Telephone Encounter (Signed)
 PT calling again. He wants to use his wife's cpap until he gets a new one but will need [place] to order settings to be changed. His # is [phone]

## 2023-08-16 NOTE — Telephone Encounter (Signed)
Orders placed and pt notified.

## 2023-08-17 ENCOUNTER — Other Ambulatory Visit (HOSPITAL_COMMUNITY): Payer: Self-pay | Admitting: Internal Medicine

## 2023-08-17 DIAGNOSIS — M5451 Vertebrogenic low back pain: Secondary | ICD-10-CM | POA: Diagnosis not present

## 2023-08-17 DIAGNOSIS — I4891 Unspecified atrial fibrillation: Secondary | ICD-10-CM | POA: Diagnosis not present

## 2023-08-17 DIAGNOSIS — R7301 Impaired fasting glucose: Secondary | ICD-10-CM | POA: Diagnosis not present

## 2023-08-17 DIAGNOSIS — D509 Iron deficiency anemia, unspecified: Secondary | ICD-10-CM | POA: Diagnosis not present

## 2023-08-17 DIAGNOSIS — I10 Essential (primary) hypertension: Secondary | ICD-10-CM | POA: Diagnosis not present

## 2023-08-17 DIAGNOSIS — I110 Hypertensive heart disease with heart failure: Secondary | ICD-10-CM | POA: Diagnosis not present

## 2023-08-17 DIAGNOSIS — E782 Mixed hyperlipidemia: Secondary | ICD-10-CM | POA: Diagnosis not present

## 2023-08-17 DIAGNOSIS — I872 Venous insufficiency (chronic) (peripheral): Secondary | ICD-10-CM

## 2023-08-17 DIAGNOSIS — I2510 Atherosclerotic heart disease of native coronary artery without angina pectoris: Secondary | ICD-10-CM | POA: Diagnosis not present

## 2023-08-17 DIAGNOSIS — J90 Pleural effusion, not elsewhere classified: Secondary | ICD-10-CM | POA: Diagnosis not present

## 2023-08-17 DIAGNOSIS — G4733 Obstructive sleep apnea (adult) (pediatric): Secondary | ICD-10-CM | POA: Diagnosis not present

## 2023-08-17 NOTE — Telephone Encounter (Signed)
 Pt notified that HST has to be completed first

## 2023-08-17 NOTE — Telephone Encounter (Signed)
 Order was placed yesterday, pt notified

## 2023-08-18 ENCOUNTER — Other Ambulatory Visit: Payer: Self-pay | Admitting: Cardiology

## 2023-08-19 ENCOUNTER — Telehealth: Payer: Self-pay | Admitting: Pulmonary Disease

## 2023-08-19 NOTE — Telephone Encounter (Signed)
 [employer] states needs order for CPAP machine pressure settings change. [employer] phone number is [phone].

## 2023-08-19 NOTE — Telephone Encounter (Signed)
 Did we do this already? And were waiting on HST

## 2023-08-19 NOTE — Telephone Encounter (Signed)
 Patient is aware that he needs HST- PCC's can we get this scheduled as soon as possible?   However, until patient gets a HST he wants to know if we can up the pressure on his wife's machine that he is using.

## 2023-08-19 NOTE — Telephone Encounter (Signed)
 Pt has been scheduled for a HST on Friday. He will be returning it on Monday.

## 2023-08-20 ENCOUNTER — Encounter

## 2023-08-20 DIAGNOSIS — G4733 Obstructive sleep apnea (adult) (pediatric): Secondary | ICD-10-CM

## 2023-08-21 ENCOUNTER — Other Ambulatory Visit: Payer: Self-pay

## 2023-08-21 ENCOUNTER — Emergency Department (HOSPITAL_COMMUNITY)

## 2023-08-21 ENCOUNTER — Inpatient Hospital Stay (HOSPITAL_COMMUNITY)
Admission: EM | Admit: 2023-08-21 | Discharge: 2023-08-24 | DRG: 291 | Disposition: A | Discharge status: Home Health | Attending: Family Medicine | Admitting: Family Medicine

## 2023-08-21 ENCOUNTER — Encounter (HOSPITAL_COMMUNITY): Payer: Self-pay

## 2023-08-21 DIAGNOSIS — G4733 Obstructive sleep apnea (adult) (pediatric): Secondary | ICD-10-CM | POA: Diagnosis not present

## 2023-08-21 DIAGNOSIS — E785 Hyperlipidemia, unspecified: Secondary | ICD-10-CM | POA: Diagnosis present

## 2023-08-21 DIAGNOSIS — Z8249 Family history of ischemic heart disease and other diseases of the circulatory system: Secondary | ICD-10-CM | POA: Diagnosis not present

## 2023-08-21 DIAGNOSIS — Z955 Presence of coronary angioplasty implant and graft: Secondary | ICD-10-CM

## 2023-08-21 DIAGNOSIS — K219 Gastro-esophageal reflux disease without esophagitis: Secondary | ICD-10-CM | POA: Diagnosis present

## 2023-08-21 DIAGNOSIS — E66812 Obesity, class 2: Secondary | ICD-10-CM | POA: Diagnosis present

## 2023-08-21 DIAGNOSIS — I4819 Other persistent atrial fibrillation: Secondary | ICD-10-CM | POA: Diagnosis present

## 2023-08-21 DIAGNOSIS — Y92018 Other place in single-family (private) house as the place of occurrence of the external cause: Secondary | ICD-10-CM

## 2023-08-21 DIAGNOSIS — Z951 Presence of aortocoronary bypass graft: Secondary | ICD-10-CM | POA: Diagnosis not present

## 2023-08-21 DIAGNOSIS — J90 Pleural effusion, not elsewhere classified: Secondary | ICD-10-CM | POA: Diagnosis not present

## 2023-08-21 DIAGNOSIS — Z743 Need for continuous supervision: Secondary | ICD-10-CM | POA: Diagnosis not present

## 2023-08-21 DIAGNOSIS — Z79899 Other long term (current) drug therapy: Secondary | ICD-10-CM | POA: Diagnosis not present

## 2023-08-21 DIAGNOSIS — I25110 Atherosclerotic heart disease of native coronary artery with unstable angina pectoris: Secondary | ICD-10-CM

## 2023-08-21 DIAGNOSIS — R0902 Hypoxemia: Secondary | ICD-10-CM | POA: Diagnosis not present

## 2023-08-21 DIAGNOSIS — K0889 Other specified disorders of teeth and supporting structures: Secondary | ICD-10-CM | POA: Diagnosis present

## 2023-08-21 DIAGNOSIS — I2510 Atherosclerotic heart disease of native coronary artery without angina pectoris: Secondary | ICD-10-CM | POA: Diagnosis not present

## 2023-08-21 DIAGNOSIS — I5033 Acute on chronic diastolic (congestive) heart failure: Secondary | ICD-10-CM | POA: Diagnosis present

## 2023-08-21 DIAGNOSIS — Z6837 Body mass index (BMI) 37.0-37.9, adult: Secondary | ICD-10-CM

## 2023-08-21 DIAGNOSIS — R6889 Other general symptoms and signs: Secondary | ICD-10-CM | POA: Diagnosis not present

## 2023-08-21 DIAGNOSIS — I4821 Permanent atrial fibrillation: Secondary | ICD-10-CM | POA: Diagnosis not present

## 2023-08-21 DIAGNOSIS — R0602 Shortness of breath: Secondary | ICD-10-CM | POA: Diagnosis not present

## 2023-08-21 DIAGNOSIS — S42292A Other displaced fracture of upper end of left humerus, initial encounter for closed fracture: Secondary | ICD-10-CM | POA: Diagnosis not present

## 2023-08-21 DIAGNOSIS — I110 Hypertensive heart disease with heart failure: Principal | ICD-10-CM | POA: Diagnosis present

## 2023-08-21 DIAGNOSIS — M70812 Other soft tissue disorders related to use, overuse and pressure, left shoulder: Secondary | ICD-10-CM | POA: Diagnosis not present

## 2023-08-21 DIAGNOSIS — I499 Cardiac arrhythmia, unspecified: Secondary | ICD-10-CM | POA: Diagnosis not present

## 2023-08-21 DIAGNOSIS — R918 Other nonspecific abnormal finding of lung field: Secondary | ICD-10-CM | POA: Diagnosis not present

## 2023-08-21 DIAGNOSIS — Z87891 Personal history of nicotine dependence: Secondary | ICD-10-CM | POA: Diagnosis not present

## 2023-08-21 DIAGNOSIS — I252 Old myocardial infarction: Secondary | ICD-10-CM | POA: Diagnosis not present

## 2023-08-21 DIAGNOSIS — Z880 Allergy status to penicillin: Secondary | ICD-10-CM | POA: Diagnosis not present

## 2023-08-21 DIAGNOSIS — Z7984 Long term (current) use of oral hypoglycemic drugs: Secondary | ICD-10-CM

## 2023-08-21 DIAGNOSIS — W010XXA Fall on same level from slipping, tripping and stumbling without subsequent striking against object, initial encounter: Secondary | ICD-10-CM | POA: Diagnosis present

## 2023-08-21 DIAGNOSIS — I447 Left bundle-branch block, unspecified: Secondary | ICD-10-CM | POA: Diagnosis present

## 2023-08-21 DIAGNOSIS — R0609 Other forms of dyspnea: Secondary | ICD-10-CM | POA: Diagnosis not present

## 2023-08-21 DIAGNOSIS — I4891 Unspecified atrial fibrillation: Secondary | ICD-10-CM | POA: Diagnosis not present

## 2023-08-21 DIAGNOSIS — S0990XA Unspecified injury of head, initial encounter: Secondary | ICD-10-CM | POA: Diagnosis not present

## 2023-08-21 DIAGNOSIS — Z7901 Long term (current) use of anticoagulants: Secondary | ICD-10-CM

## 2023-08-21 DIAGNOSIS — I69354 Hemiplegia and hemiparesis following cerebral infarction affecting left non-dominant side: Secondary | ICD-10-CM

## 2023-08-21 DIAGNOSIS — I10 Essential (primary) hypertension: Secondary | ICD-10-CM | POA: Diagnosis not present

## 2023-08-21 DIAGNOSIS — Z823 Family history of stroke: Secondary | ICD-10-CM | POA: Diagnosis not present

## 2023-08-21 DIAGNOSIS — I509 Heart failure, unspecified: Secondary | ICD-10-CM | POA: Diagnosis not present

## 2023-08-21 DIAGNOSIS — J811 Chronic pulmonary edema: Secondary | ICD-10-CM | POA: Diagnosis not present

## 2023-08-21 DIAGNOSIS — M7989 Other specified soft tissue disorders: Secondary | ICD-10-CM | POA: Diagnosis not present

## 2023-08-21 DIAGNOSIS — S42295A Other nondisplaced fracture of upper end of left humerus, initial encounter for closed fracture: Secondary | ICD-10-CM | POA: Diagnosis not present

## 2023-08-21 DIAGNOSIS — S42302A Unspecified fracture of shaft of humerus, left arm, initial encounter for closed fracture: Secondary | ICD-10-CM | POA: Diagnosis present

## 2023-08-21 LAB — CBC WITH DIFFERENTIAL/PLATELET
Abs Immature Granulocytes: 0.03 10*3/uL (ref 0.00–0.07)
Basophils Absolute: 0 10*3/uL (ref 0.0–0.1)
Basophils Relative: 0 %
Eosinophils Absolute: 0 10*3/uL (ref 0.0–0.5)
Eosinophils Relative: 1 %
HCT: 41.8 % (ref 39.0–52.0)
Hemoglobin: 13.5 g/dL (ref 13.0–17.0)
Immature Granulocytes: 0 %
Lymphocytes Relative: 9 %
Lymphs Abs: 0.7 10*3/uL (ref 0.7–4.0)
MCH: 30.8 pg (ref 26.0–34.0)
MCHC: 32.3 g/dL (ref 30.0–36.0)
MCV: 95.4 fL (ref 80.0–100.0)
Monocytes Absolute: 0.5 10*3/uL (ref 0.1–1.0)
Monocytes Relative: 5 %
Neutro Abs: 7.1 10*3/uL (ref 1.7–7.7)
Neutrophils Relative %: 85 %
Platelets: 152 10*3/uL (ref 150–400)
RBC: 4.38 MIL/uL (ref 4.22–5.81)
RDW: 15.7 % — ABNORMAL HIGH (ref 11.5–15.5)
WBC: 8.4 10*3/uL (ref 4.0–10.5)
nRBC: 0 % (ref 0.0–0.2)

## 2023-08-21 LAB — COMPREHENSIVE METABOLIC PANEL
ALT: 22 U/L (ref 0–44)
AST: 25 U/L (ref 15–41)
Albumin: 4 g/dL (ref 3.5–5.0)
Alkaline Phosphatase: 87 U/L (ref 38–126)
Anion gap: 13 (ref 5–15)
BUN: 12 mg/dL (ref 8–23)
CO2: 21 mmol/L — ABNORMAL LOW (ref 22–32)
Calcium: 9.1 mg/dL (ref 8.9–10.3)
Chloride: 102 mmol/L (ref 98–111)
Creatinine, Ser: 0.66 mg/dL (ref 0.61–1.24)
GFR, Estimated: >60 mL/min (ref >60)
Glucose, Bld: 145 mg/dL — ABNORMAL HIGH (ref 70–99)
Potassium: 3.7 mmol/L (ref 3.5–5.1)
Sodium: 136 mmol/L (ref 135–145)
Total Bilirubin: 1 mg/dL (ref 0.0–1.2)
Total Protein: 7.1 g/dL (ref 6.5–8.1)

## 2023-08-21 LAB — TROPONIN I (HIGH SENSITIVITY): Troponin I (High Sensitivity): 16 ng/L (ref <18)

## 2023-08-21 LAB — BRAIN NATRIURETIC PEPTIDE: B Natriuretic Peptide: 859 pg/mL — ABNORMAL HIGH (ref 0.0–100.0)

## 2023-08-21 MED ORDER — FUROSEMIDE 10 MG/ML IJ SOLN
40.0000 mg | INTRAMUSCULAR | Status: AC
  Administered 2023-08-21: 40 mg via INTRAVENOUS
  Filled 2023-08-21: qty 4

## 2023-08-21 MED ORDER — OXYCODONE-ACETAMINOPHEN 5-325 MG PO TABS
2.0000 | ORAL_TABLET | Freq: Once | ORAL | Status: AC
  Administered 2023-08-21: 2 via ORAL
  Filled 2023-08-21: qty 2

## 2023-08-21 NOTE — ED Notes (Signed)
 Placed on monitor, EKG done. Spo2 89% on room air. Per Hyacinth Meeker, hold off on supplemental o2 for now until reassessment.

## 2023-08-21 NOTE — ED Triage Notes (Signed)
 Pt from home via [facility] after a mechanical fall, falling onto left shoulder. Pt with previous stroke and left leg weakness that caused him to trip and fall. On eliquis. Denies hitting head. Miller at bedside on arrival for assessment. Endorses drinking "a little" vodka tonight.

## 2023-08-21 NOTE — H&P (Incomplete)
 History and Physical    Patient: Martin [name] NWG:[id number] DOB: October 01, 1955 DOA: 08/21/2023 DOS: the patient was seen and examined on 08/21/2023 PCP: Benita Stabile, MD  Patient coming from: {Point_of_Origin:[address]}  Chief Complaint:  Chief Complaint  Patient presents with   Fall   HPI: Martin [name] is a 68 y.o. male with medical history significant of ***  Review of Systems: {ROS_Text:26778} Past Medical History:  Diagnosis Date   (HFpEF) heart failure with preserved ejection fraction (HCC)    CAD (coronary artery disease)    a. s/p PCI to LAD 2001. b. s/p stenting to the RCA and mid LAD December 2011, residual distal RCA disease treated medically. c.  Multivessel disease status post CABG October 2020   Carotid artery disease (HCC)    Essential hypertension    GERD (gastroesophageal reflux disease)    Habitual alcohol use    Hyperlipidemia    Low back pain    Myocardial infarction (HCC) 2020   OSA on CPAP    Osteoarthritis    Permanent atrial fibrillation (HCC)    a. dx 10/2017   Polysubstance abuse (HCC)    History of marijuana and Vicodin abuse   PVC's (premature ventricular contractions)    Seasonal allergies    Stroke [facility]) 2020   Past Surgical History:  Procedure Laterality Date   CARDIOVERSION N/A 11/19/2017   Procedure: CARDIOVERSION;  Surgeon: Laqueta Linden, MD;  Location: [facility];  Service: Cardiovascular;  Laterality: N/A;   COLONOSCOPY WITH PROPOFOL N/A 03/01/2020   Procedure: COLONOSCOPY WITH PROPOFOL;  Surgeon: Dolores Frame, MD;  Location: [facility];  Service: Gastroenterology;  Laterality: N/A;  730   CORONARY ANGIOPLASTY WITH STENT PLACEMENT  2011   CORONARY ARTERY BYPASS GRAFT N/A 03/10/2019   Procedure: CORONARY ARTERY BYPASS GRAFTING (CABG)X 3 Using Left Internal Mammary Artery and Right Saphenous Vein Grafts;  Surgeon: Kerin Perna, MD;  Location: [facility] OR;  Service: Open Heart Surgery;  Laterality: N/A;    ESOPHAGOGASTRODUODENOSCOPY (EGD) WITH PROPOFOL N/A 03/01/2020   Procedure: ESOPHAGOGASTRODUODENOSCOPY (EGD) WITH PROPOFOL;  Surgeon: Dolores Frame, MD;  Location: [facility];  Service: Gastroenterology;  Laterality: N/A;   LEFT HEART CATH AND CORONARY ANGIOGRAPHY N/A 03/08/2019   Procedure: LEFT HEART CATH AND CORONARY ANGIOGRAPHY;  Surgeon: Lyn Records, MD;  Location: [facility] INVAS[facility] CV LAB;  Service: Cardiovascular;  Laterality: N/A;   POLYPECTOMY  03/01/2020   Procedure: POLYPECTOMY;  Surgeon: Dolores Frame, MD;  Location: [facility];  Service: Gastroenterology;;   TEE WITHOUT CARDIOVERSION N/A 03/10/2019   Procedure: TRANSESOPHAGEAL ECHOCARDIOGRAM (TEE);  Surgeon: Donata Clay, Theron Arista, MD;  Location: [facility] OR;  Service: Open Heart Surgery;  Laterality: N/A;   TONSILLECTOMY     Social History:  reports that he quit smoking about 24 years ago. His smoking use included cigarettes. He started smoking about 55 years ago. He has a 35 pack-year smoking history. He has never used smokeless tobacco. He reports that he does not currently use alcohol. He reports current drug use. Drug: Marijuana.  Allergies  Allergen Reactions   Penicillins Hives and Rash    Has patient had a PCN reaction causing immediate rash, facial/tongue/throat swelling, SOB or lightheadedness with hypotension: Unknown Has patient had a PCN reaction causing severe rash involving mucus membranes or skin necrosis: Unknown Has patient had a PCN reaction that required hospitalization: No Has patient had a PCN reaction occurring within the last 10 years: No If all of the  above answers are "NO", then may proceed with Cephalosporin use.      Family History  Problem Relation Age of Onset   Stroke Mother    Other Mother        small bowel obstruction   Heart attack Father    Heart attack Brother    Healthy Son     Prior to Admission medications   Medication Sig Start Date End Date Taking? Authorizing  Provider  allopurinol (ZYLOPRIM) 300 MG tablet Take 300 mg by mouth daily.     [provider]  apixaban (ELIQUIS) 5 MG TABS tablet TAKE ONE TABLET BY MOUTH TWICE DAILY 05/17/23   Jonelle Sidle, MD  atorvastatin (LIPITOR) 80 MG tablet TAKE ONE TABLET BY MOUTH EVERY DAY 02/05/23   Jonelle Sidle, MD  Cholecalciferol (VITAMIN D-3) 1000 UNITS CAPS Take 1,000 Units by mouth every morning.     [provider]  colchicine 0.6 MG tablet Take 1 tablet (0.6 mg total) by mouth daily at 6 PM. Patient taking differently: Take 0.6 mg by mouth as needed. 07/10/19   Raulkar, Drema Pry, MD  Cyanocobalamin (VITAMIN B-12) 2500 MCG SUBL Place 2,500 mcg under the tongue every morning.     [provider]  diclofenac Sodium (VOLTAREN) 1 % GEL Takes only as needed 01/16/21   [provider]  docusate sodium (COLACE) 100 MG capsule Take 2 capsules (200 mg total) by mouth daily. Patient taking differently: Take 100 mg by mouth at bedtime as needed. 04/11/19   Jacquelynn Cree, PA-C  ENTRESTO 24-26 MG TAKE ONE TABLET BY MOUTH TWICE DAILY 05/03/23   Jonelle Sidle, MD  fluticasone [facility]) 50 MCG/ACT nasal spray Place 2 sprays into the nose daily.    [provider]  folic acid (FOLVITE) 1 MG tablet TAKE ONE TABLET BY MOUTH EVERY DAY Patient taking differently: Take 1 mg by mouth daily. 02/07/12   de Melanie Crazier, MD  furosemide (LASIX) 40 MG tablet Take 1 tablet (40 mg total) by mouth daily as needed (swelling or weight gain). 12/08/22   Jonelle Sidle, MD  HYDROcodone-acetaminophen (NORCO) 10-325 MG tablet Take 1 tablet by mouth every 6 (six) hours as needed.    [provider]  JARDIANCE 10 MG TABS tablet TAKE 1 TABLET BY MOUTH EVERY DAY BEFORE BREAKFAST 04/12/23   Jonelle Sidle, MD  loratadine (CLARITIN) 10 MG tablet Take 10 mg by mouth daily.    [provider]  LORazepam (ATIVAN) 0.5 MG tablet Take 0.5 mg by mouth at bedtime.  05/15/19   [provider]  meclizine (ANTIVERT) 32 MG tablet Take 32 mg by mouth daily as needed for dizziness.     [provider]  nitroGLYCERIN (NITROSTAT) 0.4 MG SL tablet Place 1 tablet (0.4 mg total) under the tongue every 5 (five) minutes as needed for chest pain. 07/27/22   Jonelle Sidle, MD  pantoprazole (PROTONIX) 40 MG tablet Take 1 tablet (40 mg total) by mouth daily. 04/11/19   Love, Evlyn Kanner, PA-C  polyethylene glycol (MIRALAX / GLYCOLAX) packet Take 17 g by mouth at bedtime as needed for mild constipation.    [provider]  Potassium Chloride ER (KLOR-CON M15) 15 MEQ TBCR TAKE TWO TABLETS BY MOUTH EVERY DAY 12/11/22   Jonelle Sidle, MD  spironolactone (ALDACTONE) 25 MG tablet TAKE 1/2 TABLET BY MOUTH DAILY 08/18/23   Jonelle Sidle, MD  tamsulosin (FLOMAX) 0.4 MG CAPS capsule Take  0.4 mg by mouth at bedtime.    [provider]    Physical Exam: Vitals:   08/21/23 2102 08/21/23 2315  BP: (!) 167/108   Pulse: (!) 113 98  SpO2:  96%  Weight: 104.8 kg   Height: 5\' 6"  (1.676 m)    *** Data Reviewed: {Tip this will not be part of the note when signed- Document your independent interpretation of telemetry tracing, EKG, lab, Radiology test or any other diagnostic tests. Add any new diagnostic test ordered today. (Optional):26781} {Results:26384}  Assessment and Plan: No notes have been filed under this hospital service. Service: Hospitalist     Advance Care Planning:   Code Status: Full Code ***  Consults: ***  Family Communication: ***  Severity of Illness: {Observation/Inpatient:21159}  Author: Coralie Keens, MD 08/21/2023 11:30 PM  For on call review [email or web address].

## 2023-08-21 NOTE — ED Provider Notes (Signed)
  EMERGENCY DEPARTMENT AT [facility] Provider Note   CSN: [id number] Arrival date & time: 08/21/23  2054     History  Chief Complaint  Patient presents with   Martin [name] is a 68 y.o. male.   Fall   This patient is a 68 year old male on Eliquis, he presents after tripping over his foot, he has had a prior stroke and drags his left foot a little bit.  He was outside on his back patio and when he got his foot caught he tripped landing on his left side injuring his left knee and his left shoulder.  Paramedics were called, the and noticed that his left shoulder was swollen, he did not have any loss of consciousness and states that he might of bumped his head a little but he has no headache no neck pain.  He has no chest pain or shortness of breath, denies any other symptoms.    Home Medications Prior to Admission medications   Medication Sig Start Date End Date Taking? Authorizing Provider  allopurinol (ZYLOPRIM) 300 MG tablet Take 300 mg by mouth daily.     [provider]  apixaban (ELIQUIS) 5 MG TABS tablet TAKE ONE TABLET BY MOUTH TWICE DAILY 05/17/23   Jonelle Sidle, MD  atorvastatin (LIPITOR) 80 MG tablet TAKE ONE TABLET BY MOUTH EVERY DAY 02/05/23   Jonelle Sidle, MD  Cholecalciferol (VITAMIN D-3) 1000 UNITS CAPS Take 1,000 Units by mouth every morning.     [provider]  colchicine 0.6 MG tablet Take 1 tablet (0.6 mg total) by mouth daily at 6 PM. Patient taking differently: Take 0.6 mg by mouth as needed. 07/10/19   Raulkar, Drema Pry, MD  Cyanocobalamin (VITAMIN B-12) 2500 MCG SUBL Place 2,500 mcg under the tongue every morning.     [provider]  diclofenac Sodium (VOLTAREN) 1 % GEL Takes only as needed 01/16/21   [provider]  docusate sodium (COLACE) 100 MG capsule Take 2 capsules (200 mg total) by mouth daily. Patient taking differently: Take 100 mg by mouth at bedtime as needed. 04/11/19    Jacquelynn Cree, PA-C  ENTRESTO 24-26 MG TAKE ONE TABLET BY MOUTH TWICE DAILY 05/03/23   Jonelle Sidle, MD  fluticasone [facility]) 50 MCG/ACT nasal spray Place 2 sprays into the nose daily.    [provider]  folic acid (FOLVITE) 1 MG tablet TAKE ONE TABLET BY MOUTH EVERY DAY Patient taking differently: Take 1 mg by mouth daily. 02/07/12   de Melanie Crazier, MD  furosemide (LASIX) 40 MG tablet Take 1 tablet (40 mg total) by mouth daily as needed (swelling or weight gain). 12/08/22   Jonelle Sidle, MD  HYDROcodone-acetaminophen (NORCO) 10-325 MG tablet Take 1 tablet by mouth every 6 (six) hours as needed.    [provider]  JARDIANCE 10 MG TABS tablet TAKE 1 TABLET BY MOUTH EVERY DAY BEFORE BREAKFAST 04/12/23   Jonelle Sidle, MD  loratadine (CLARITIN) 10 MG tablet Take 10 mg by mouth daily.    [provider]  LORazepam (ATIVAN) 0.5 MG tablet Take 0.5 mg by mouth at bedtime.  05/15/19   [provider]  meclizine (ANTIVERT) 32 MG tablet Take 32 mg by mouth daily as needed for dizziness.     [provider]  nitroGLYCERIN (NITROSTAT) 0.4 MG SL tablet Place 1 tablet (0.4 mg total) under the tongue every 5 (five) minutes as needed  for chest pain. 07/27/22   Jonelle Sidle, MD  pantoprazole (PROTONIX) 40 MG tablet Take 1 tablet (40 mg total) by mouth daily. 04/11/19   Love, Evlyn Kanner, PA-C  polyethylene glycol (MIRALAX / GLYCOLAX) packet Take 17 g by mouth at bedtime as needed for mild constipation.    [provider]  Potassium Chloride ER (KLOR-CON M15) 15 MEQ TBCR TAKE TWO TABLETS BY MOUTH EVERY DAY 12/11/22   Jonelle Sidle, MD  spironolactone (ALDACTONE) 25 MG tablet TAKE 1/2 TABLET BY MOUTH DAILY 08/18/23   Jonelle Sidle, MD  tamsulosin (FLOMAX) 0.4 MG CAPS capsule Take 0.4 mg by mouth at bedtime.    [provider]      Allergies    Penicillins    Review of Systems   Review of Systems  All other systems reviewed and  are negative.   Physical Exam Updated Vital Signs BP (!) 167/108   Pulse (!) 113   Ht 1.676 m (5\' 6" )   Wt 104.8 kg   BMI 37.28 kg/m  Physical Exam Vitals and nursing note reviewed.  Constitutional:      General: He is not in acute distress.    Appearance: He is well-developed.  HENT:     Head: Normocephalic and atraumatic.     Mouth/Throat:     Pharynx: No oropharyngeal exudate.  Eyes:     General: No scleral icterus.       Right eye: No discharge.        Left eye: No discharge.     Conjunctiva/sclera: Conjunctivae normal.     Pupils: Pupils are equal, round, and reactive to light.  Neck:     Thyroid: No thyromegaly.     Vascular: No JVD.  Cardiovascular:     Rate and Rhythm: Normal rate and regular rhythm.     Heart sounds: Normal heart sounds. No murmur heard.    No friction rub. No gallop.     Comments: Normal pulses at the left radial artery Pulmonary:     Effort: Pulmonary effort is normal. No respiratory distress.     Breath sounds: Normal breath sounds. No wheezing or rales.  Abdominal:     General: Bowel sounds are normal. There is no distension.     Palpations: Abdomen is soft. There is no mass.     Tenderness: There is no abdominal tenderness.  Musculoskeletal:        General: Swelling, tenderness and deformity present. Normal range of motion.     Cervical back: Normal range of motion and neck supple.     Comments: Slight abrasion over the left knee, swelling over the left shoulder with decreased range of motion secondary to pain.  No tenderness over the clavicle  Lymphadenopathy:     Cervical: No cervical adenopathy.  Skin:    General: Skin is warm and dry.     Findings: No erythema or rash.  Neurological:     Mental Status: He is alert.     Coordination: Coordination normal.     Comments: Symmetrical strength and sensation of the bilateral upper and lower extremities.  Psychiatric:        Behavior: Behavior normal.     ED Results / Procedures /  Treatments   Labs (all labs ordered are listed, but only abnormal results are displayed) Labs Reviewed  CBC WITH DIFFERENTIAL/PLATELET - Abnormal; Notable for the following components:      Result Value   RDW 15.7 (*)  All other components within normal limits  COMPREHENSIVE METABOLIC PANEL - Abnormal; Notable for the following components:   CO2 21 (*)    Glucose, Bld 145 (*)    All other components within normal limits  BRAIN NATRIURETIC PEPTIDE - Abnormal; Notable for the following components:   B Natriuretic Peptide 859.0 (*)    All other components within normal limits  TROPONIN I (HIGH SENSITIVITY)    EKG EKG Interpretation Date/Time:  Saturday August 21 2023 21:10:17 EDT Ventricular Rate:  98 PR Interval:    QRS Duration:  119 QT Interval:  358 QTC Calculation: 458 R Axis:   -31  Text Interpretation: Atrial fibrillation Ventricular premature complex Incomplete left bundle branch block since last tracing no significant change Confirmed by Eber Hong ([id number]) on 08/21/2023 9:36:24 PM  Radiology DG Chest Port 1 View Result Date: 08/21/2023 CLINICAL DATA:  Shortness of breath. EXAM: PORTABLE CHEST 1 VIEW COMPARISON:  May 26, 2023 FINDINGS: Multiple sternal wires are noted. The cardiac silhouette is moderately enlarged and unchanged in size. There is prominence of the central pulmonary vasculature. Mild diffusely increased interstitial lung markings are seen. Stable, mild linear scarring and/or atelectasis is present within the mid left lung and left lung base. There are small, stable bilateral pleural effusions. No pneumothorax is identified. Multilevel degenerative changes are seen throughout the thoracic spine. IMPRESSION: 1. Stable cardiomegaly with mild interstitial edema and small bilateral pleural effusions. 2. Stable mid left lung and left basilar linear scarring and/or atelectasis. Electronically Signed   By: Aram Candela M.D.   On: 08/21/2023 22:30   DG Knee  Complete 4 Views Left Result Date: 08/21/2023 CLINICAL DATA:  Larey Seat, swelling EXAM: LEFT KNEE - COMPLETE 4+ VIEW COMPARISON:  None Available. FINDINGS: Frontal, bilateral oblique, lateral views of the left knee are obtained. No fracture, subluxation, or dislocation. There is mild 3 compartmental osteoarthritis. Chondrocalcinosis within the medial and lateral compartments. No joint effusion. Soft tissues are unremarkable. IMPRESSION: 1. Mild 3 compartmental osteoarthritis.  No acute fracture. Electronically Signed   By: Sharlet Salina M.D.   On: 08/21/2023 21:33   DG Shoulder Left Result Date: 08/21/2023 CLINICAL DATA:  Larey Seat onto left shoulder, swelling EXAM: LEFT SHOULDER - 2+ VIEW COMPARISON:  None Available. FINDINGS: Internal rotation, external rotation, and transscapular views of the left shoulder are obtained. There is a comminuted left humeral neck fracture, extending through the greater tuberosity of the left humerus. Alignment is near anatomic. No subluxation or dislocation. Moderate hypertrophic changes of the acromioclavicular joint. Superficial soft tissue swelling overlies the left shoulder. Visualized portions of the left chest are clear. IMPRESSION: 1. Comminuted left humeral neck fracture extending through the greater tuberosity. Near anatomic alignment. Electronically Signed   By: Sharlet Salina M.D.   On: 08/21/2023 21:32    Procedures Procedures    Medications Ordered in ED Medications  furosemide (LASIX) injection 40 mg (has no administration in time range)  oxyCODONE-acetaminophen (PERCOCET/ROXICET) 5-325 MG per tablet 2 tablet (2 tablets Oral Given 08/21/23 2122)    ED Course/ Medical Decision Making/ A&P                                 Medical Decision Making Amount and/or Complexity of Data Reviewed Labs: ordered. Radiology: ordered.  Risk Prescription drug management. Decision regarding hospitalization.   No signs of head trauma, no signs of neck tenderness, no  neurologic findings, he did land almost directly  on the left shoulder so we will check an x-ray to make sure is not fractured or dislocated.  He is anticoagulated and at the minimum there is a hemarthrosis.  Patient is agreeable to the plan.  C-collar removed is placed by EMS as he does not have any signs of head injury neck pain or neurologic symptoms  Laboratory data: The patient has elevated BNP consistent with congestive heart failure  Chest x-ray: I personally viewed and interpreted the x-ray which shows that the patient has pulmonary edema, fracture proximal humerus  EKG: Appears to be in atrial fibrillation with mild RVR  Medication management and ED course: The patient does have what appears to be a fracture proximal humerus, sling placed, Lasix given for congestive heart failure  Discussed with Dr. Ella Jubilee of the hospital service who will admit for CHF and rapid A-fib.  The patient's heart rate is around 100, I have not given him any rate control at this point but diuresis only        Final Clinical Impression(s) / ED Diagnoses Final diagnoses:  Acute on chronic congestive heart failure, unspecified heart failure type (HCC)  Other closed nondisplaced fracture of proximal end of left humerus, initial encounter    Rx / DC Orders ED Discharge Orders     None         Eber Hong, MD 08/21/23 2240

## 2023-08-21 NOTE — H&P (Incomplete)
 History and Physical    Patient: Martin [name]:[id number] DOB: 18-Dec-1955 DOA: 08/21/2023 DOS: the patient was seen and examined on 08/21/2023 PCP: Benita Stabile, MD  Patient coming from: {Point_of_Origin:[address]}  Chief Complaint:  Chief Complaint  Patient presents with  . Fall   HPI: Martin [name] is a 68 y.o. male with medical history significant of heart failure, coronary artery disease, hypertension, GERD, hyperlipidemia, atrial fibrillation, and history of CVA who presented after a mechanical fall.   Patient has not been feeling well for the last 4 to 5 days, not able to sleep, feeling very tired and fatigued. Because of dental pain he has not been able to eat well lately.  He noted dyspnea on exertion, but no lower extremity edema, PND or frank orthopnea.  Today while ambulating at his own hight he fell and laded on his left arm, producing severe pain. Because of persistent pain he came to the ED.   He endorsed being compliant with his medications. Apparently he has been drinking alcohol tonight. He does have chronic left sided weakness due to prior stroke.  His Cpap machine broke about 3 weeks ago.    Review of Systems: As mentioned in the history of present illness. All other systems reviewed and are negative. Past Medical History:  Diagnosis Date  . (HFpEF) heart failure with preserved ejection fraction (HCC)   . CAD (coronary artery disease)    a. s/p PCI to LAD 2001. b. s/p stenting to the RCA and mid LAD December 2011, residual distal RCA disease treated medically. c.  Multivessel disease status post CABG October 2020  . Carotid artery disease (HCC)   . Essential hypertension   . GERD (gastroesophageal reflux disease)   . Habitual alcohol use   . Hyperlipidemia   . Low back pain   . Myocardial infarction (HCC) 2020  . OSA on CPAP   . Osteoarthritis   . Permanent atrial fibrillation (HCC)    a. dx 10/2017  . Polysubstance abuse (HCC)    History of marijuana  and Vicodin abuse  . PVC's (premature ventricular contractions)   . Seasonal allergies   . Stroke [facility]) 2020   Past Surgical History:  Procedure Laterality Date  . CARDIOVERSION N/A 11/19/2017   Procedure: CARDIOVERSION;  Surgeon: Laqueta Linden, MD;  Location[facility];  Service: Cardiovascular;  Laterality: N/A;  . COLONOSCOPY WITH PROPOFOL N/A 03/01/2020   Procedure: COLONOSCOPY WITH PROPOFOL;  Surgeon: Dolores Frame, MD;  Location: [facility];  Service: Gastroenterology;  Laterality: N/A;  730  . CORONARY ANGIOPLASTY WITH STENT PLACEMENT  2011  . CORONARY ARTERY BYPASS GRAFT N/A 03/10/2019   Procedure: CORONARY ARTERY BYPASS GRAFTING (CABG)X 3 Using Left Internal Mammary Artery and Right Saphenous Vein Grafts;  Surgeon: Kerin Perna, MD;  Location: [facility] OR;  Service: Open Heart Surgery;  Laterality: N/A;  . ESOPHAGOGASTRODUODENOSCOPY (EGD) WITH PROPOFOL N/A 03/01/2020   Procedure: ESOPHAGOGASTRODUODENOSCOPY (EGD) WITH PROPOFOL;  Surgeon: Dolores Frame, MD;  [facility]: [facility];  Service: Gastroenterology;  Laterality: N/A;  . LEFT HEART CATH AND CORONARY ANGIOGRAPHY N/A 03/08/2019   Procedure: LEFT HEART CATH AND CORONARY ANGIOGRAPHY;  Surgeon: Lyn Records, MD;  Location: [facility];  Service: Cardiovascular;  Laterality: N/A;  . POLYPECTOMY  03/01/2020   Procedure: POLYPECTOMY;  Surgeon: Dolores Frame, MD;  Location: [facility];  Service: Gastroenterology;;  . TEE WITHOUT CARDIOVERSION N/A 03/10/2019   Procedure: TRANSESOPHAGEAL ECHOCARDIOGRAM (TEE);  Surgeon: Donata Clay, Theron Arista, MD;  Location: [facility] OR;  Service: Open Heart Surgery;  Laterality: N/A;  . TONSILLECTOMY     Social History:  reports that he quit smoking about 24 years ago. His smoking use included cigarettes. He started smoking about 55 years ago. He has a 35 pack-year smoking history. He has never used smokeless tobacco. He reports that he does not currently use  alcohol. He reports current drug use. Drug: Marijuana.  Allergies  Allergen Reactions  . Penicillins Hives and Rash    Has patient had a PCN reaction causing immediate rash, facial/tongue/throat swelling, SOB or lightheadedness with hypotension: Unknown Has patient had a PCN reaction causing severe rash involving mucus membranes or skin necrosis: Unknown Has patient had a PCN reaction that required hospitalization: No Has patient had a PCN reaction occurring within the last 10 years: No If all of the above answers are "NO", then may proceed with Cephalosporin use.      Family History  Problem Relation Age of Onset  . Stroke Mother   . Other Mother        small bowel obstruction  . Heart attack Father   . Heart attack Brother   . Healthy Son     Prior to Admission medications   Medication Sig Start Date End Date Taking? Authorizing Provider  allopurinol (ZYLOPRIM) 300 MG tablet Take 300 mg by mouth daily.     [provider]  apixaban (ELIQUIS) 5 MG TABS tablet TAKE ONE TABLET BY MOUTH TWICE DAILY 05/17/23   Jonelle Sidle, MD  atorvastatin (LIPITOR) 80 MG tablet TAKE ONE TABLET BY MOUTH EVERY DAY 02/05/23   Jonelle Sidle, MD  Cholecalciferol (VITAMIN D-3) 1000 UNITS CAPS Take 1,000 Units by mouth every morning.     [provider]  colchicine 0.6 MG tablet Take 1 tablet (0.6 mg total) by mouth daily at 6 PM. Patient taking differently: Take 0.6 mg by mouth as needed. 07/10/19   Raulkar, Drema Pry, MD  Cyanocobalamin (VITAMIN B-12) 2500 MCG SUBL Place 2,500 mcg under the tongue every morning.     [provider]  diclofenac Sodium (VOLTAREN) 1 % GEL Takes only as needed 01/16/21   [provider]  docusate sodium (COLACE) 100 MG capsule Take 2 capsules (200 mg total) by mouth daily. Patient taking differently: Take 100 mg by mouth at bedtime as needed. 04/11/19   Jacquelynn Cree, PA-C  ENTRESTO 24-26 MG TAKE ONE TABLET BY MOUTH TWICE DAILY  05/03/23   Jonelle Sidle, MD  fluticasone [facility]) 50 MCG/ACT nasal spray Place 2 sprays into the nose daily.    [provider]  folic acid (FOLVITE) 1 MG tablet TAKE ONE TABLET BY MOUTH EVERY DAY Patient taking differently: Take 1 mg by mouth daily. 02/07/12   de Melanie Crazier, MD  furosemide (LASIX) 40 MG tablet Take 1 tablet (40 mg total) by mouth daily as needed (swelling or weight gain). 12/08/22   Jonelle Sidle, MD  HYDROcodone-acetaminophen (NORCO) 10-325 MG tablet Take 1 tablet by mouth every 6 (six) hours as needed.    [provider]  JARDIANCE 10 MG TABS tablet TAKE 1 TABLET BY MOUTH EVERY DAY BEFORE BREAKFAST 04/12/23   Jonelle Sidle, MD  loratadine (CLARITIN) 10 MG tablet Take 10 mg by mouth daily.    [provider]  LORazepam (ATIVAN) 0.5 MG tablet Take 0.5 mg by mouth at bedtime.  05/15/19   [provider]  meclizine (ANTIVERT) 32 MG tablet Take 32 mg by mouth daily as needed for dizziness.     [provider]  nitroGLYCERIN (NITROSTAT) 0.4 MG SL tablet Place 1 tablet (0.4 mg total) under the tongue every 5 (five) minutes as needed for chest pain. 07/27/22   Jonelle Sidle, MD  pantoprazole (PROTONIX) 40 MG tablet Take 1 tablet (40 mg total) by mouth daily. 04/11/19   Love, Evlyn Kanner, PA-C  polyethylene glycol (MIRALAX / GLYCOLAX) packet Take 17 g by mouth at bedtime as needed for mild constipation.    [provider]  Potassium Chloride ER (KLOR-CON M15) 15 MEQ TBCR TAKE TWO TABLETS BY MOUTH EVERY DAY 12/11/22   Jonelle Sidle, MD  spironolactone (ALDACTONE) 25 MG tablet TAKE 1/2 TABLET BY MOUTH DAILY 08/18/23   Jonelle Sidle, MD  tamsulosin (FLOMAX) 0.4 MG CAPS capsule Take 0.4 mg by mouth at bedtime.    [provider]    Physical Exam: Vitals:   08/21/23 2102 08/21/23 2315  BP: (!) 167/108   Pulse: (!) 113 98  SpO2:  96%  Weight: 104.8 kg   Height: 5\' 6"  (1.676 m)    Neurology awake and  alert ENT with mild pallor Cardiovascular with S1 and S2 present, irregularly irregular with no gallops, rubs or murmurs No JVD  Respiratory with mild rales bilaterally with no wheezing or rhonchi Abdomen with no distention  Trace lower extremity edema, non pitting  Data Reviewed: {Tip this will not be part of the note when signed- Document your independent interpretation of telemetry tracing, EKG, lab, Radiology test or any other diagnostic tests. Add any new diagnostic test ordered today. (Optional):[id number]}  Na 136, K 3,7 Cl 102 bicarbonate 21 glucose 145, bun 12 cr 0,66  AST 25 ALT 22 BNP 859  High sensitive troponin 16  Wbc 8,4 hgb 13,5 plt 152   Chest radiograph with cardiomegaly, bilateral hilar vascular congestion and small left pleural effusion. Sternotomy wires in place.   EKG 98 bpm, normal axis, left bundle branch block, qtc 458, atrial fibrillation rhythm with PVC, no significant ST segment or T wave changes.   Left knee radiograph with mild 3 compartmental osteoarthritis. No acute fracture.  Left shoulder radiograph with comminuted left humeral neck fracture extending through the grater tuberosity. Near anatomic alignment.   Assessment and Plan: No notes have been filed under this hospital service. Service: Hospitalist     Advance Care Planning:   Code Status: Full Code   Consults: none   Family Communication: no family at the bedside   Severity of Illness: The appropriate patient status for this patient is INPATIENT. Inpatient status is judged to be reasonable and necessary in order to provide the required intensity of service to ensure the patient's safety. The patient's presenting symptoms, physical exam findings, and initial radiographic and laboratory data in the context of their chronic comorbidities is felt to place them at high risk for further clinical deterioration. Furthermore, it is not anticipated that the patient will be medically stable for discharge  from the hospital within 2 midnights of admission.   * I certify that at the point of admission it is my clinical judgment that the patient will require inpatient hospital care spanning beyond 2 midnights from the point of admission due to high intensity of service, high risk for further deterioration and high frequency of surveillance required.*  Author: Coralie Keens, MD 08/21/2023 11:30 PM  For on call review [email or web address].

## 2023-08-22 ENCOUNTER — Inpatient Hospital Stay (HOSPITAL_COMMUNITY)

## 2023-08-22 ENCOUNTER — Other Ambulatory Visit (HOSPITAL_COMMUNITY): Payer: Self-pay | Admitting: *Deleted

## 2023-08-22 DIAGNOSIS — I5033 Acute on chronic diastolic (congestive) heart failure: Secondary | ICD-10-CM | POA: Diagnosis not present

## 2023-08-22 DIAGNOSIS — S42302A Unspecified fracture of shaft of humerus, left arm, initial encounter for closed fracture: Secondary | ICD-10-CM | POA: Diagnosis present

## 2023-08-22 DIAGNOSIS — R0609 Other forms of dyspnea: Secondary | ICD-10-CM | POA: Diagnosis not present

## 2023-08-22 LAB — CBC
HCT: 37.1 % — ABNORMAL LOW (ref 39.0–52.0)
Hemoglobin: 12.2 g/dL — ABNORMAL LOW (ref 13.0–17.0)
MCH: 31 pg (ref 26.0–34.0)
MCHC: 32.9 g/dL (ref 30.0–36.0)
MCV: 94.2 fL (ref 80.0–100.0)
Platelets: 146 10*3/uL — ABNORMAL LOW (ref 150–400)
RBC: 3.94 MIL/uL — ABNORMAL LOW (ref 4.22–5.81)
RDW: 15.6 % — ABNORMAL HIGH (ref 11.5–15.5)
WBC: 8.2 10*3/uL (ref 4.0–10.5)
nRBC: 0 % (ref 0.0–0.2)

## 2023-08-22 LAB — ECHOCARDIOGRAM COMPLETE
AR max vel: 1 cm2
AV Area VTI: 1.01 cm2
AV Area mean vel: 1.08 cm2
AV Mean grad: 13.5 mmHg
AV Peak grad: 25.6 mmHg
Ao pk vel: 2.53 m/s
Area-P 1/2: 3.91 cm2
Height: 66 in
S' Lateral: 4.3 cm
Weight: 3643.76 [oz_av]

## 2023-08-22 LAB — HIV ANTIBODY (ROUTINE TESTING W REFLEX): HIV Screen 4th Generation wRfx: NONREACTIVE

## 2023-08-22 LAB — BASIC METABOLIC PANEL
Anion gap: 7 (ref 5–15)
BUN: 14 mg/dL (ref 8–23)
CO2: 25 mmol/L (ref 22–32)
Calcium: 8.6 mg/dL — ABNORMAL LOW (ref 8.9–10.3)
Chloride: 102 mmol/L (ref 98–111)
Creatinine, Ser: 0.51 mg/dL — ABNORMAL LOW (ref 0.61–1.24)
GFR, Estimated: >60 mL/min (ref >60)
Glucose, Bld: 139 mg/dL — ABNORMAL HIGH (ref 70–99)
Potassium: 3.3 mmol/L — ABNORMAL LOW (ref 3.5–5.1)
Sodium: 134 mmol/L — ABNORMAL LOW (ref 135–145)

## 2023-08-22 MED ORDER — ACETAMINOPHEN 325 MG PO TABS: 650.0000 mg | ORAL_TABLET | Freq: Four times a day (QID) | ORAL | Status: DC | PRN

## 2023-08-22 MED ORDER — EMPAGLIFLOZIN 10 MG PO TABS
10.0000 mg | ORAL_TABLET | Freq: Every day | ORAL | Status: DC
  Administered 2023-08-22 – 2023-08-24 (×3): 10 mg via ORAL
  Filled 2023-08-22 (×3): qty 1

## 2023-08-22 MED ORDER — APIXABAN 5 MG PO TABS
5.0000 mg | ORAL_TABLET | Freq: Two times a day (BID) | ORAL | Status: DC
  Administered 2023-08-22 – 2023-08-24 (×6): 5 mg via ORAL
  Filled 2023-08-22 (×6): qty 1

## 2023-08-22 MED ORDER — POLYETHYLENE GLYCOL 3350 17 G PO PACK: 17.0000 g | PACK | Freq: Every evening | ORAL | Status: DC | PRN

## 2023-08-22 MED ORDER — ALLOPURINOL 300 MG PO TABS
300.0000 mg | ORAL_TABLET | Freq: Every day | ORAL | Status: DC
  Administered 2023-08-22 – 2023-08-24 (×3): 300 mg via ORAL
  Filled 2023-08-22 (×3): qty 1

## 2023-08-22 MED ORDER — TAMSULOSIN HCL 0.4 MG PO CAPS
0.4000 mg | ORAL_CAPSULE | Freq: Every day | ORAL | Status: DC
Start: 2023-08-22 — End: 2023-08-24
  Administered 2023-08-22 – 2023-08-23 (×3): 0.4 mg via ORAL
  Filled 2023-08-22 (×3): qty 1

## 2023-08-22 MED ORDER — POTASSIUM CHLORIDE CRYS ER 20 MEQ PO TBCR
40.0000 meq | EXTENDED_RELEASE_TABLET | Freq: Once | ORAL | Status: AC
Start: 2023-08-22 — End: 2023-08-22
  Administered 2023-08-22: 40 meq via ORAL
  Filled 2023-08-22: qty 2

## 2023-08-22 MED ORDER — HYDROCODONE-ACETAMINOPHEN 10-325 MG PO TABS
1.0000 | ORAL_TABLET | Freq: Four times a day (QID) | ORAL | Status: DC | PRN
  Administered 2023-08-22 – 2023-08-23 (×3): 1 via ORAL
  Filled 2023-08-22 (×3): qty 1

## 2023-08-22 MED ORDER — LORATADINE 10 MG PO TABS
10.0000 mg | ORAL_TABLET | Freq: Every day | ORAL | Status: DC
  Administered 2023-08-22 – 2023-08-24 (×3): 10 mg via ORAL
  Filled 2023-08-22 (×3): qty 1

## 2023-08-22 MED ORDER — FUROSEMIDE 10 MG/ML IJ SOLN
60.0000 mg | Freq: Two times a day (BID) | INTRAMUSCULAR | Status: DC
  Administered 2023-08-22 – 2023-08-24 (×5): 60 mg via INTRAVENOUS
  Filled 2023-08-22 (×5): qty 6

## 2023-08-22 MED ORDER — ATORVASTATIN CALCIUM 40 MG PO TABS
80.0000 mg | ORAL_TABLET | Freq: Every day | ORAL | Status: DC
  Administered 2023-08-22 – 2023-08-24 (×3): 80 mg via ORAL
  Filled 2023-08-22 (×3): qty 2

## 2023-08-22 MED ORDER — NITROGLYCERIN 0.4 MG SL SUBL: 0.4000 mg | SUBLINGUAL_TABLET | SUBLINGUAL | Status: DC | PRN

## 2023-08-22 MED ORDER — LORAZEPAM 0.5 MG PO TABS
0.5000 mg | ORAL_TABLET | Freq: Every day | ORAL | Status: DC
  Administered 2023-08-22 – 2023-08-23 (×3): 0.5 mg via ORAL
  Filled 2023-08-22 (×3): qty 1

## 2023-08-22 MED ORDER — ONDANSETRON HCL 4 MG/2ML IJ SOLN: 4.0000 mg | Freq: Four times a day (QID) | INTRAMUSCULAR | Status: DC | PRN

## 2023-08-22 MED ORDER — FLUTICASONE PROPIONATE 50 MCG/ACT NA SUSP
2.0000 | Freq: Every day | NASAL | Status: DC
  Administered 2023-08-22 – 2023-08-24 (×3): 2 via NASAL
  Filled 2023-08-22: qty 16

## 2023-08-22 MED ORDER — ONDANSETRON HCL 4 MG PO TABS: 4.0000 mg | ORAL_TABLET | Freq: Four times a day (QID) | ORAL | Status: DC | PRN

## 2023-08-22 MED ORDER — OXYCODONE HCL 5 MG PO TABS
5.0000 mg | ORAL_TABLET | Freq: Four times a day (QID) | ORAL | Status: DC | PRN
  Administered 2023-08-24: 5 mg via ORAL
  Filled 2023-08-22: qty 1

## 2023-08-22 MED ORDER — PERFLUTREN LIPID MICROSPHERE
1.0000 mL | INTRAVENOUS | Status: AC | PRN
  Administered 2023-08-22: 5 mL via INTRAVENOUS

## 2023-08-22 MED ORDER — SACUBITRIL-VALSARTAN 24-26 MG PO TABS
1.0000 | ORAL_TABLET | Freq: Two times a day (BID) | ORAL | Status: DC
  Administered 2023-08-22 – 2023-08-24 (×6): 1 via ORAL
  Filled 2023-08-22 (×6): qty 1

## 2023-08-22 MED ORDER — ACETAMINOPHEN 500 MG PO TABS
1000.0000 mg | ORAL_TABLET | Freq: Three times a day (TID) | ORAL | Status: DC
  Administered 2023-08-22 – 2023-08-24 (×5): 1000 mg via ORAL
  Filled 2023-08-22 (×7): qty 2

## 2023-08-22 MED ORDER — ACETAMINOPHEN 650 MG RE SUPP: 650.0000 mg | Freq: Four times a day (QID) | RECTAL | Status: DC | PRN

## 2023-08-22 MED ORDER — PANTOPRAZOLE SODIUM 40 MG PO TBEC
40.0000 mg | DELAYED_RELEASE_TABLET | Freq: Every day | ORAL | Status: DC
  Administered 2023-08-22 – 2023-08-24 (×3): 40 mg via ORAL
  Filled 2023-08-22 (×3): qty 1

## 2023-08-22 MED ORDER — SPIRONOLACTONE 12.5 MG HALF TABLET
12.5000 mg | ORAL_TABLET | Freq: Every day | ORAL | Status: DC
  Administered 2023-08-22 – 2023-08-24 (×3): 12.5 mg via ORAL
  Filled 2023-08-22 (×3): qty 1

## 2023-08-22 MED ORDER — MORPHINE SULFATE (PF) 2 MG/ML IV SOLN
2.0000 mg | INTRAVENOUS | Status: DC | PRN
  Administered 2023-08-22 – 2023-08-23 (×6): 2 mg via INTRAVENOUS
  Filled 2023-08-22 (×6): qty 1

## 2023-08-22 NOTE — Assessment & Plan Note (Addendum)
 12/2022 echocardiogram with preserved LV systolic function EF 50 to 55%, mild dilatated LV cavity, mild LVH, RV systolic function preserved. LA and RA with moderate dilatation, mild aortic valve stenosis.   Patient with mild volume overload.   Plan to continue diuresis with furosemide 60 mg IV bid to target negative fluid balance.  Continue entresto, empagliflozin and spironolactone.  Monitor urine output, strict in and out, daily weights.  Check echocardiogram.

## 2023-08-22 NOTE — Progress Notes (Signed)
 PROGRESS NOTE    Patient: Martin [name]                            PCP: Benita Stabile, MD                    DOB: 08/09/1955            DOA: 08/21/2023 UEA:[id number]             DOS: 08/22/2023, 9:55 AM   LOS: 1 day   Date of Service: The patient was seen and examined on 08/22/2023  Subjective:   The patient was seen and examined this morning. Hemodynamically stable. No issues overnight .  Brief Narrative:    Martin [name] is a 68 y.o. male with medical history significant of Heart Failure, CAD, HTN, GERD, HLD, Atrial fibrillation, and CVA -who presented after a mechanical fall.   Patient has not been feeling well for the last 4 to 5 days, not able to sleep, feeling very tired and fatigued. Because of dental pain he has not been able to eat well lately.  He noted dyspnea on exertion, but no lower extremity edema, PND or frank orthopnea.  While ambulating at his own hight he fell and landed on his left arm, producing severe pain. Because of persistent pain he came to the ED.  He endorsed being compliant with his medications. Apparently he has been drinking alcohol tonight. He does have chronic left sided weakness due to prior stroke.  His Cpap machine broke about 3 weeks ago.       Assessment & Plan:   Principal Problem:   Acute on chronic diastolic CHF (congestive heart failure) (HCC) Active Problems:   Closed left humeral fracture   Essential hypertension   Persistent atrial fibrillation (HCC)   CAD (coronary artery disease)   Dyslipidemia   Obesity, class 2     Assessment and Plan:   * Acute on chronic diastolic CHF (congestive heart failure) (HCC) - On 2L of oxygen 99% -BNP 859 >> -Monitoring daily weight, I's and O's Filed Weights   08/21/23 2102 08/22/23 0548  Weight: 104.8 kg 103.3 kg    Intake/Output Summary (Last 24 hours) at 08/22/2023 0741 Last data filed at 08/22/2023 0306 Gross per 24 hour  Intake 240 ml  Output 1100 ml  Net -860 ml      12/2022 echocardiogram with preserved LV systolic function EF 50 to 55%, mild dilatated LV cavity, mild LVH, RV systolic function preserved. LA and RA with moderate dilatation, mild aortic valve stenosis.    Patient with mild volume overload.    -Will continue diuresis with furosemide 60 mg IV bid to target negative fluid balance.  - Continue Entresto, Empagliflozin and Spironolactone.   Echocardiogram;     Closed left humeral fracture Stable -Sling was placed in the ED.  -Continue pain control with hydrocodone and will add morphine for sever pain.  - Follow up with orthopedics as outpatient.  - Follow up with Pt and Ot.    Essential hypertension Continue blood pressure control with Entresto and spironolactone Aggressive diuresis with IV furosemide.    Persistent atrial fibrillation (HCC) -Monitoring on telemetry rate controlled, continue apixaban    CAD (coronary artery disease) -Denies chest pain, continue current meds, statins, apixaban, not on antiplatelet therapy      Dyslipidemia Continue statin with atorvastatin.    Obesity, class 2 Calculated BMI is 37.2     -----------------------------------------------------------------------------------------------------------------------------------------------  Nutritional status:  The patient's BMI is: Body mass index is 36.76 kg/m. I agree with the assessment and plan as outlined --------------------------------------------------------------------------------------------------------------------------------  DVT prophylaxis:  SCDs Start: 08/22/23 0034 apixaban (ELIQUIS) tablet 5 mg   Code Status:   Code Status: Full Code  Family Communication: No family member present at bedside- attempt will be made to update daily The above findings and plan of care has been discussed with patient (and family)  in detail,  they expressed understanding and agreement of above. -Advance care planning has been discussed.    Admission status:   Status is: Inpatient Remains inpatient appropriate because: IV diuretics   Disposition: From  - home             Planning for discharge in 1 days: to [facility] w [facility]   Procedures:   No admission procedures for hospital encounter.   Antimicrobials:  Anti-infectives (From admission, onward)    None        Medication:   acetaminophen  1,000 mg Oral Q8H   allopurinol  300 mg Oral Daily   apixaban  5 mg Oral BID   atorvastatin  80 mg Oral Daily   empagliflozin  10 mg Oral Daily   fluticasone  2 spray Each Nare Daily   furosemide  60 mg Intravenous Q12H   loratadine  10 mg Oral Daily   LORazepam  0.5 mg Oral QHS   pantoprazole  40 mg Oral Daily   sacubitril-valsartan  1 tablet Oral BID   spironolactone  12.5 mg Oral Daily   tamsulosin  0.4 mg Oral QHS    HYDROcodone-acetaminophen, morphine injection, nitroGLYCERIN, ondansetron **OR** ondansetron (ZOFRAN) IV, oxyCODONE, polyethylene glycol   Objective:   Vitals:   08/22/23 0029 08/22/23 0432 08/22/23 0548 08/22/23 0847  BP: (!) 152/108 126/77  122/69  Pulse: 80 69  69  Resp: 18 18  18   Temp: 97.7 F (36.5 C) 97.6 F (36.4 C)  97.9 F (36.6 C)  TempSrc: Oral Oral  Oral  SpO2: 96% 99%  99%  Weight:   103.3 kg   Height:        Intake/Output Summary (Last 24 hours) at 08/22/2023 0955 Last data filed at 08/22/2023 4401 Gross per 24 hour  Intake 240 ml  Output 1800 ml  Net -1560 ml   Filed Weights   08/21/23 2102 08/22/23 0548  Weight: 104.8 kg 103.3 kg     Physical examination:   Constitution:  Alert, cooperative, no distress,  Appears calm and comfortable  Psychiatric:   Normal and stable mood and affect, cognition intact,   HEENT:        Normocephalic, PERRL, otherwise with in Normal limits  Chest:         Chest symmetric Cardio vascular:  S1/S2, RRR, No murmure, No Rubs or Gallops  pulmonary: Positive breath sounds diffusely, respirations unlabored, negative wheezes, mild diffuse  Rales, mild lower lobe crackles Abdomen: Soft, non-tender, non-distended, bowel sounds,no masses, no organomegaly Muscular skeletal: Left arm in the sling, limited range of motion due to pain,-humerus fracture  Limited exam - in bed, able to move all 4 extremities,   Neuro: CNII-XII intact. , normal motor and sensation, reflexes intact  Extremities: +1 pitting edema lower extremities, +2 pulses  Skin: Dry, warm to touch, negative for any Rashes, No open wounds Wounds: per nursing documentation   ------------------------------------------------------------------------------------------------------------------------------------------    LABs:     Latest Ref Rng & Units 08/22/2023    4:15 AM 08/21/2023  9:43 PM 01/14/2022    2:07 PM  CBC  WBC 4.0 - 10.5 K/uL 8.2  8.4  8.4   Hemoglobin 13.0 - 17.0 g/dL 40.9  81.1  91.4   Hematocrit 39.0 - 52.0 % 37.1  41.8  41.5   Platelets 150 - 400 K/uL 146  152  184       Latest Ref Rng & Units 08/22/2023    4:15 AM 08/21/2023    9:43 PM 01/14/2022    2:07 PM  CMP  Glucose 70 - 99 mg/dL 782  956  95   BUN 8 - 23 mg/dL 14  12  19    Creatinine 0.61 - 1.24 mg/dL 2.13  0.86  5.78   Sodium 135 - 145 mmol/L 134  136  136   Potassium 3.5 - 5.1 mmol/L 3.3  3.7  4.3   Chloride 98 - 111 mmol/L 102  102  105   CO2 22 - 32 mmol/L 25  21  25    Calcium 8.9 - 10.3 mg/dL 8.6  9.1  8.9   Total Protein 6.5 - 8.1 g/dL  7.1    Total Bilirubin 0.0 - 1.2 mg/dL  1.0    Alkaline Phos 38 - 126 U/L  87    AST 15 - 41 U/L  25    ALT 0 - 44 U/L  22         Micro Results No results found for this or any previous visit (from the past 240 hours).  Radiology Reports DG Chest Port 1 View Result Date: 08/21/2023 CLINICAL DATA:  Shortness of breath. EXAM: PORTABLE CHEST 1 VIEW COMPARISON:  May 26, 2023 FINDINGS: Multiple sternal wires are noted. The cardiac silhouette is moderately enlarged and unchanged in size. There is prominence of the central pulmonary  vasculature. Mild diffusely increased interstitial lung markings are seen. Stable, mild linear scarring and/or atelectasis is present within the mid left lung and left lung base. There are small, stable bilateral pleural effusions. No pneumothorax is identified. Multilevel degenerative changes are seen throughout the thoracic spine. IMPRESSION: 1. Stable cardiomegaly with mild interstitial edema and small bilateral pleural effusions. 2. Stable mid left lung and left basilar linear scarring and/or atelectasis. Electronically Signed   By: Aram Candela M.D.   On: 08/21/2023 22:30   DG Knee Complete 4 Views Left Result Date: 08/21/2023 CLINICAL DATA:  Larey Seat, swelling EXAM: LEFT KNEE - COMPLETE 4+ VIEW COMPARISON:  None Available. FINDINGS: Frontal, bilateral oblique, lateral views of the left knee are obtained. No fracture, subluxation, or dislocation. There is mild 3 compartmental osteoarthritis. Chondrocalcinosis within the medial and lateral compartments. No joint effusion. Soft tissues are unremarkable. IMPRESSION: 1. Mild 3 compartmental osteoarthritis.  No acute fracture. Electronically Signed   By: Sharlet Salina M.D.   On: 08/21/2023 21:33   DG Shoulder Left Result Date: 08/21/2023 CLINICAL DATA:  Larey Seat onto left shoulder, swelling EXAM: LEFT SHOULDER - 2+ VIEW COMPARISON:  None Available. FINDINGS: Internal rotation, external rotation, and transscapular views of the left shoulder are obtained. There is a comminuted left humeral neck fracture, extending through the greater tuberosity of the left humerus. Alignment is near anatomic. No subluxation or dislocation. Moderate hypertrophic changes of the acromioclavicular joint. Superficial soft tissue swelling overlies the left shoulder. Visualized portions of the left chest are clear. IMPRESSION: 1. Comminuted left humeral neck fracture extending through the greater tuberosity. Near anatomic alignment. Electronically Signed   By: Sharlet Salina M.D.   On:  08/21/2023  21:32    SIGNED: Kendell Bane, MD, FHM. FAAFP. [facility] - [facility]ist Time spent - 55 min.  In seeing, evaluating and examining the patient. Reviewing medical records, labs, drawn plan of care. [facility],  Pager (please use [email or web address] to page/ text) Please use Epic Secure Chat for non-urgent communication (7AM-7PM)  If 7PM-7AM, please contact night-coverage [email or web address], 08/22/2023, 9:55 AM

## 2023-08-22 NOTE — Assessment & Plan Note (Signed)
 Continue blood pressure control with Entresto and spironolactone Aggressive diuresis with IV furosemide.

## 2023-08-22 NOTE — Assessment & Plan Note (Signed)
 Continue telemetry monitoring.  Patient not on rate controlling agents.  Continue anticoagulation with apixaban.

## 2023-08-22 NOTE — Plan of Care (Signed)

## 2023-08-22 NOTE — Assessment & Plan Note (Signed)
 Patient with no chest pain, no acute coronary syndrome.  Continue atorvastatin. Patient on full anticoagulation with apixaban, not on antiplatelet therapy.

## 2023-08-22 NOTE — Assessment & Plan Note (Signed)
Calculated BMI is 37.2

## 2023-08-22 NOTE — Assessment & Plan Note (Addendum)
 Sling was placed in the ED.  Continue pain control with hydrocodone and will add morphine for sever pain.  Follow up with orthopedics as outpatient.  Follow up with Pt and Ot.

## 2023-08-22 NOTE — Progress Notes (Signed)
*  PRELIMINARY RESULTS* Echocardiogram 2D Echocardiogram has been performed with [facility].  Stacey Drain 08/22/2023, 11:26 AM

## 2023-08-22 NOTE — Hospital Course (Addendum)
 Martin [name] is a 68 y.o. male with medical history significant of Heart Failure, CAD, HTN, GERD, HLD, Atrial fibrillation, and CVA -who presented after a mechanical fall.   Patient has not been feeling well for the last 4 to 5 days, not able to sleep, feeling very tired and fatigued. Because of dental pain he has not been able to eat well lately.  He noted dyspnea on exertion, but no lower extremity edema, PND or frank orthopnea.  While ambulating at his own hight he fell and landed on his left arm, producing severe pain. Because of persistent pain he came to the ED.  He endorsed being compliant with his medications. Apparently he has been drinking alcohol tonight. He does have chronic left sided weakness due to prior stroke.  His Cpap machine broke about 3 weeks ago.       Assessment & Plan:   Principal Problem:   Acute on chronic diastolic CHF (congestive heart failure) (HCC) Active Problems:   Closed left humeral fracture   Essential hypertension   Persistent atrial fibrillation (HCC)   CAD (coronary artery disease)   Dyslipidemia   Obesity, class 2     Assessment and Plan:   * Acute on chronic diastolic CHF (congestive heart failure) (HCC) - On 2L of oxygen 99% -BNP 859 >> -Monitoring daily weight, I's and O's Filed Weights   08/21/23 2102 08/22/23 0548  Weight: 104.8 kg 103.3 kg    Intake/Output Summary (Last 24 hours) at 08/22/2023 0741 Last data filed at 08/22/2023 0306 Gross per 24 hour  Intake 240 ml  Output 1100 ml  Net -860 ml     12/2022 echocardiogram with preserved LV systolic function EF 50 to 55%, mild dilatated LV cavity, mild LVH, RV systolic function preserved. LA and RA with moderate dilatation, mild aortic valve stenosis.    Patient with mild volume overload.    -Will continue diuresis with furosemide 60 mg IV bid to target negative fluid balance.  - Continue Entresto, Empagliflozin and Spironolactone.   Echocardiogram;  Left Ventricle:  Left ventricular ejection fraction, by estimation, is 50  to 55%. The left ventricle has low normal function. The left ventricle  demonstrates global hypokinesis. Definity contrast agent was given IV to  delineate the left ventricular  endocardial borders. The left ventricular internal cavity size was normal  in size. There is mild concentric left ventricular hypertrophy. Left  ventricular diastolic parameters are indeterminate.    Closed left humeral fracture Stable -Sling was placed in the ED.  -Continue pain control with hydrocodone and will add morphine for sever pain.  - Follow up with orthopedics as outpatient.  - Follow up with Pt and Ot.    Essential hypertension Continue blood pressure control with Entresto and spironolactone Aggressive diuresis with IV furosemide.    Persistent atrial fibrillation (HCC) -Monitoring on telemetry rate controlled, continue apixaban    CAD (coronary artery disease) -Denies chest pain, continue current meds, statins, apixaban, not on antiplatelet therapy      Dyslipidemia Continue statin with atorvastatin.    Obesity, class 2 Calculated BMI is 37.2

## 2023-08-22 NOTE — Assessment & Plan Note (Signed)
 Continue statin with atorvastatin.

## 2023-08-22 NOTE — Progress Notes (Signed)
   08/22/23 1352  TOC Brief Assessment  Insurance and Status Reviewed  Patient has primary care physician Yes  Home environment has been reviewed From home  Prior level of function: Independent  Prior/Current Home Services No current home services  Social Drivers of Health Review SDOH reviewed no interventions necessary  Readmission risk has been reviewed Yes  Transition of care needs transition of care needs identified, TOC will continue to follow    Pending PT/OT evaluation. TOC to follow.

## 2023-08-23 DIAGNOSIS — I5033 Acute on chronic diastolic (congestive) heart failure: Secondary | ICD-10-CM | POA: Diagnosis not present

## 2023-08-23 LAB — BASIC METABOLIC PANEL
Anion gap: 10 (ref 5–15)
BUN: 14 mg/dL (ref 8–23)
CO2: 30 mmol/L (ref 22–32)
Calcium: 9 mg/dL (ref 8.9–10.3)
Chloride: 98 mmol/L (ref 98–111)
Creatinine, Ser: 0.77 mg/dL (ref 0.61–1.24)
GFR, Estimated: >60 mL/min (ref >60)
Glucose, Bld: 104 mg/dL — ABNORMAL HIGH (ref 70–99)
Potassium: 3.6 mmol/L (ref 3.5–5.1)
Sodium: 138 mmol/L (ref 135–145)

## 2023-08-23 LAB — BRAIN NATRIURETIC PEPTIDE: B Natriuretic Peptide: 459 pg/mL — ABNORMAL HIGH (ref 0.0–100.0)

## 2023-08-23 MED ORDER — ACETAMINOPHEN 500 MG PO TABS
1000.0000 mg | ORAL_TABLET | Freq: Three times a day (TID) | ORAL | 0 refills | Status: DC
Start: 2023-08-23 — End: 2024-05-03

## 2023-08-23 MED ORDER — FUROSEMIDE 40 MG PO TABS: 40.0000 mg | ORAL_TABLET | Freq: Every day | ORAL | Status: DC

## 2023-08-23 MED ORDER — OXYCODONE HCL 5 MG PO TABS: 5.0000 mg | ORAL_TABLET | Freq: Four times a day (QID) | ORAL | 0 refills | Status: AC | PRN

## 2023-08-23 NOTE — Progress Notes (Signed)
   08/23/23 1824  Vitals  Temp 97.6 F (36.4 C)  Temp Source Oral  BP (!) 193/104  BP Location Left Arm  BP Method Automatic  Patient Position (if appropriate) Sitting  Pulse Rate 99  Pulse Rate Source Dinamap  Resp 19  Level of Consciousness  Level of Consciousness Alert  MEWS COLOR  MEWS Score Color Green  Oxygen Therapy  SpO2 99 %  O2 Device Room Air  MEWS Score  MEWS Temp 0  MEWS Systolic 0  MEWS Pulse 0  MEWS RR 0  MEWS LOC 0  MEWS Score 0  Provider Notification  Provider Name/Title Dr Flossie Dibble  Date Provider Notified 08/23/23  Time Provider Notified 1826  Method of Notification Page  Notification Reason Other (Comment);Critical Result (b/p 193/104)  Test performed and critical result vital signs  Date Critical Result Received 08/23/23  Time Critical Result Received 1827

## 2023-08-23 NOTE — Discharge Summary (Addendum)
 Physician Discharge Summary   Patient: Martin [name] MRN: [id number] DOB: 1956-04-06  Admit date:     08/21/2023  Discharge date:   Discharge Physician: Kendell Bane   PCP: Benita Stabile, MD   Patient desatted with ambulation, and minimum exertion, O2 dropped to 87%  Patient remain anxious  -Keep the patient for another day for close observation, continue IV diuretics As needed DuoNeb   Home health PT and home oxygen has been arranged.    Recommendations at discharge:   Take current medication as directed Take high-dose Tylenol as directed for 3 days then as needed You may take your Oxy IR for severe pain (recommended to take it at night) Follow-up with orthopedic physician in 1 weeks Follow-up with cardiologist in 1-2 weeks, now your Lasix is a scheduled daily, monitor your daily weight, elevate lower extremities while asleep  Discharge Diagnoses: Principal Problem:   Acute on chronic diastolic CHF (congestive heart failure) (HCC) Active Problems:   Closed left humeral fracture   Essential hypertension   Persistent atrial fibrillation (HCC)   CAD (coronary artery disease)   Dyslipidemia   Obesity, class 2  Resolved Problems:   * No resolved hospital problems. *  Hospital Course:  Martin [name] is a 68 y.o. male with medical history significant of Heart Failure, CAD, HTN, GERD, HLD, Atrial fibrillation, and CVA -who presented after a mechanical fall.   Patient has not been feeling well for the last 4 to 5 days, not able to sleep, feeling very tired and fatigued. Because of dental pain he has not been able to eat well lately.  He noted dyspnea on exertion, but no lower extremity edema, PND or frank orthopnea.  While ambulating at his own hight he fell and landed on his left arm, producing severe pain. Because of persistent pain he came to the ED.  He endorsed being compliant with his medications. Apparently he has been drinking alcohol tonight. He does have  chronic left sided weakness due to prior stroke.  His Cpap machine broke about 3 weeks ago.         * Acute on chronic diastolic CHF (congestive heart failure) (HCC) - On 2L of oxygen 99% -BNP 859 >> -Monitoring daily weight, I's and O's Filed Weights   08/21/23 2102 08/22/23 0548 08/23/23 0317  Weight: 104.8 kg 103.3 kg 103.1 kg    Intake/Output Summary (Last 24 hours) at 08/23/2023 0932 Last data filed at 08/23/2023 6440 Gross per 24 hour  Intake 480 ml  Output 2450 ml  Net -1970 ml   12/2022 echocardiogram with preserved LV systolic function EF 50 to 55%, mild dilatated LV cavity, mild LVH, RV systolic function preserved. LA and RA with moderate dilatation, mild aortic valve stenosis.    Patient with mild volume overload.    -Will continue diuresis with furosemide 60 mg IV bid to target negative fluid balance.  - Continue Entresto, Empagliflozin and Spironolactone.   08/24/2023 Echocardiogram;  Left Ventricle: Left ventricular ejection fraction, by estimation, is 50 to 55%. The left ventricle has low normal function. The left ventricle  demonstrates global hypokinesis. Definity contrast agent was given IV to delineate the left ventricular  endocardial borders. The left ventricular internal cavity size was normal in size. There is mild concentric left ventricular hypertrophy. Left  ventricular diastolic parameters are indeterminate.    Closed left humeral fracture Stable -Sling was placed in the ED.  -Continue pain control with hydrocodone and will add morphine for  sever pain.  - Follow up with orthopedics as outpatient.     Essential hypertension Continue blood pressure control with Entresto and spironolactone Lasix   Persistent atrial fibrillation (HCC) -Monitoring on telemetry rate controlled, continue apixaban    CAD (coronary artery disease) -Denies chest pain, continue current meds, statins, apixaban, not on antiplatelet therapy      Dyslipidemia Continue  statin with atorvastatin.    Obesity, class 2 Calculated BMI is 37.2       Pain control - [employer] Controlled Substance Reporting System database was reviewed. and patient was instructed, not to drive, operate heavy machinery, perform activities at heights, swimming or participation in water activities or provide baby-sitting services while on Pain, Sleep and Anxiety Medications; until their outpatient Physician has advised to do so again. Also recommended to not to take more than prescribed Pain, Sleep and Anxiety Medications.  Consultants: Curbside Ortho Procedures performed: Echocardiogram Disposition: Home Diet recommendation:  Discharge Diet Orders (From admission, onward)     Start     Ordered   08/23/23 0000  Diet - low sodium heart healthy        08/23/23 0931           Cardiac diet DISCHARGE MEDICATION: Allergies as of 08/23/2023       Reactions   Penicillins Hives, Rash        Medication List     STOP taking these medications    colchicine 0.6 MG tablet   HYDROcodone-acetaminophen 10-325 MG tablet Commonly known as: NORCO       TAKE these medications    acetaminophen 500 MG tablet Commonly known as: TYLENOL Take 2 tablets (1,000 mg total) by mouth every 8 (eight) hours.   allopurinol 300 MG tablet Commonly known as: ZYLOPRIM Take 300 mg by mouth daily.   atorvastatin 80 MG tablet Commonly known as: LIPITOR TAKE ONE TABLET BY MOUTH EVERY DAY   diclofenac Sodium 1 % Gel Commonly known as: VOLTAREN Apply 4 g topically 4 (four) times daily as needed (Pain).   docusate sodium 100 MG capsule Commonly known as: COLACE Take 2 capsules (200 mg total) by mouth daily. What changed:  how much to take when to take this reasons to take this   Eliquis 5 MG Tabs tablet Generic drug: apixaban TAKE ONE TABLET BY MOUTH TWICE DAILY   Entresto 24-26 MG Generic drug: sacubitril-valsartan TAKE ONE TABLET BY MOUTH TWICE DAILY   escitalopram 10  MG tablet Commonly known as: LEXAPRO Take 10 mg by mouth daily.   fluticasone 50 MCG/ACT nasal spray Commonly known as: FLONASE Place 2 sprays into the nose daily.   folic acid 1 MG tablet Commonly known as: FOLVITE TAKE ONE TABLET BY MOUTH EVERY DAY   furosemide 40 MG tablet Commonly known as: LASIX Take 1 tablet (40 mg total) by mouth daily. What changed:  when to take this reasons to take this   Jardiance 10 MG Tabs tablet Generic drug: empagliflozin TAKE 1 TABLET BY MOUTH EVERY DAY BEFORE BREAKFAST   loratadine 10 MG tablet Commonly known as: CLARITIN Take 10 mg by mouth daily.   LORazepam 0.5 MG tablet Commonly known as: ATIVAN Take 0.5 mg by mouth at bedtime.   meclizine 32 MG tablet Commonly known as: ANTIVERT Take 32 mg by mouth daily as needed for dizziness.   nitroGLYCERIN 0.4 MG SL tablet Commonly known as: NITROSTAT Place 1 tablet (0.4 mg total) under the tongue every 5 (five) minutes as needed for chest  pain.   oxyCODONE 5 MG immediate release tablet Commonly known as: Oxy IR/ROXICODONE Take 1 tablet (5 mg total) by mouth every 6 (six) hours as needed for up to 3 days for severe pain (pain score 7-10).   pantoprazole 40 MG tablet Commonly known as: PROTONIX Take 1 tablet (40 mg total) by mouth daily.   polyethylene glycol 17 g packet Commonly known as: MIRALAX / GLYCOLAX Take 17 g by mouth at bedtime as needed for mild constipation.   spironolactone 25 MG tablet Commonly known as: ALDACTONE TAKE 1/2 TABLET BY MOUTH DAILY   tamsulosin 0.4 MG Caps capsule Commonly known as: FLOMAX Take 0.4 mg by mouth at bedtime.   Vitamin B-12 2500 MCG Subl Place 2,500 mcg under the tongue every morning.   Vitamin D-3 25 MCG (1000 UT) Caps Take 1,000 Units by mouth every morning.        Discharge Exam: Filed Weights   08/21/23 2102 08/22/23 0548 08/23/23 0317  Weight: 104.8 kg 103.3 kg 103.1 kg        General:  AAO x 3,  cooperative, no  distress;   HEENT:  Normocephalic, PERRL, otherwise with in Normal limits   Neuro:  CNII-XII intact. , normal motor and sensation, reflexes intact   Lungs:   Clear to auscultation BL, Respirations unlabored,  No wheezes / crackles  Cardio:    S1/S2, RRR, No murmure, No Rubs or Gallops   Abdomen:  Soft, non-tender, bowel sounds active all four quadrants, no guarding or peritoneal signs.  Muscular  skeletal:  Left arm fracture, limited range of motion, in the sling, positive pulses, mild edema  Otherwise Limited exam -global generalized weaknesses - in bed, able to move all 4 extremities,   2+ pulses,  symmetric, No pitting edema  Skin:  Dry, warm to touch, negative for any Rashes,  Wounds: Please see nursing documentation          Condition at discharge: good  The results of significant diagnostics from this hospitalization (including imaging, microbiology, ancillary and laboratory) are listed below for reference.   Imaging Studies: ECHOCARDIOGRAM COMPLETE Result Date: 08/22/2023    ECHOCARDIOGRAM REPORT   Patient Name:   Martin [name] Date of Exam: 08/22/2023 Medical Rec #:  [id number]         Height:       66.0 in Accession #:    [id number]        Weight:       227.7 lb Date of Birth:  February 09, 1956         BSA:          2.113 m Patient Age:    67 years          BP:           126/77 mmHg Patient Gender: M                 HR:           69 bpm. [facility] Procedure: 2D Echo, Cardiac Doppler, Color Doppler and Intracardiac            Opacification Agent (Both Spectral and Color Flow Doppler were            utilized during procedure). Indications:    Dyspnea R06.00  History:        Patient has prior history of Echocardiogram examinations, most  recent 12/08/2022. CHF, CAD and Previous Myocardial Infarction,                 Prior CABG, Arrythmias:Bradycardia; Risk Factors:Hypertension                 and Dyslipidemia.  Sonographer:    Celesta Gentile RCS Referring  Phys: [id number] [facility] ARRIEN  Sonographer Comments: Technically difficult study due to poor echo windows. Patient fell at home and injured his left shoulder. Is in a sling and has alot of pain when moving left arm. IMPRESSIONS  1. Left ventricular ejection fraction, by estimation, is 50 to 55%. The left ventricle has low normal function. The left ventricle demonstrates global hypokinesis. There is mild concentric left ventricular hypertrophy. Left ventricular diastolic parameters are indeterminate.  2. Right ventricular systolic function is normal. The right ventricular size is normal. Tricuspid regurgitation signal is inadequate for assessing PA pressure.  3. Left atrial size was severely dilated.  4. Right atrial size was severely dilated.  5. The mitral valve is normal in structure. No evidence of mitral valve regurgitation. No evidence of mitral stenosis.  6. The aortic valve is normal in structure. Aortic valve regurgitation is not visualized. No aortic stenosis is present.  7. The inferior vena cava is normal in size with greater than 50% respiratory variability, suggesting right atrial pressure of 3 mmHg. FINDINGS  Left Ventricle: Left ventricular ejection fraction, by estimation, is 50 to 55%. The left ventricle has low normal function. The left ventricle demonstrates global hypokinesis. Definity contrast agent was given IV to delineate the left ventricular endocardial borders. The left ventricular internal cavity size was normal in size. There is mild concentric left ventricular hypertrophy. Left ventricular diastolic parameters are indeterminate. Right Ventricle: The right ventricular size is normal. No increase in right ventricular wall thickness. Right ventricular systolic function is normal. Tricuspid regurgitation signal is inadequate for assessing PA pressure. Left Atrium: Left atrial size was severely dilated. Right Atrium: Right atrial size was severely dilated. Pericardium: There is no  evidence of pericardial effusion. Mitral Valve: The mitral valve is normal in structure. No evidence of mitral valve regurgitation. No evidence of mitral valve stenosis. Tricuspid Valve: The tricuspid valve is normal in structure. Tricuspid valve regurgitation is not demonstrated. No evidence of tricuspid stenosis. Aortic Valve: The aortic valve is normal in structure. Aortic valve regurgitation is not visualized. No aortic stenosis is present. Aortic valve mean gradient measures 13.5 mmHg. Aortic valve peak gradient measures 25.6 mmHg. Aortic valve area, by VTI measures 1.01 cm. Pulmonic Valve: The pulmonic valve was normal in structure. Pulmonic valve regurgitation is not visualized. No evidence of pulmonic stenosis. Aorta: The aortic root is normal in size and structure. Venous: The inferior vena cava is normal in size with greater than 50% respiratory variability, suggesting right atrial pressure of 3 mmHg. IAS/Shunts: No atrial level shunt detected by color flow Doppler.  LEFT VENTRICLE PLAX 2D LVIDd:         6.10 cm LVIDs:         4.30 cm LV PW:         1.10 cm LV IVS:        1.10 cm LVOT diam:     2.00 cm LV SV:         49 LV SV Index:   23 LVOT Area:     3.14 cm  RIGHT VENTRICLE TAPSE (M-mode): 2.3 cm LEFT ATRIUM  Index        RIGHT ATRIUM           Index LA diam:        4.70 cm  2.22 cm/m   RA Area:     33.00 cm LA Vol (A2C):   166.0 ml 78.55 ml/m  RA Volume:   120.00 ml 56.78 ml/m LA Vol (A4C):   111.0 ml 52.52 ml/m LA Biplane Vol: 137.0 ml 64.83 ml/m  AORTIC VALVE AV Area (Vmax):    1.00 cm AV Area (Vmean):   1.08 cm AV Area (VTI):     1.01 cm AV Vmax:           253.00 cm/s AV Vmean:          164.500 cm/s AV VTI:            0.484 m AV Peak Grad:      25.6 mmHg AV Mean Grad:      13.5 mmHg LVOT Vmax:         80.70 cm/s LVOT Vmean:        56.500 cm/s LVOT VTI:          0.155 m LVOT/AV VTI ratio: 0.32  AORTA Ao Root diam: 3.70 cm MITRAL VALVE MV Area (PHT): 3.91 cm     SHUNTS MV Decel  Time: 194 msec     Systemic VTI:  0.16 m MV E velocity: 102.00 cm/s  Systemic Diam: 2.00 cm Kardie Tobb DO Electronically signed by Thomasene Ripple DO Signature Date/Time: 08/22/2023/2:36:03 PM    Final    DG Chest Port 1 View Result Date: 08/21/2023 CLINICAL DATA:  Shortness of breath. EXAM: PORTABLE CHEST 1 VIEW COMPARISON:  May 26, 2023 FINDINGS: Multiple sternal wires are noted. The cardiac silhouette is moderately enlarged and unchanged in size. There is prominence of the central pulmonary vasculature. Mild diffusely increased interstitial lung markings are seen. Stable, mild linear scarring and/or atelectasis is present within the mid left lung and left lung base. There are small, stable bilateral pleural effusions. No pneumothorax is identified. Multilevel degenerative changes are seen throughout the thoracic spine. IMPRESSION: 1. Stable cardiomegaly with mild interstitial edema and small bilateral pleural effusions. 2. Stable mid left lung and left basilar linear scarring and/or atelectasis. Electronically Signed   By: Aram Candela M.D.   On: 08/21/2023 22:30   DG Knee Complete 4 Views Left Result Date: 08/21/2023 CLINICAL DATA:  Larey Seat, swelling EXAM: LEFT KNEE - COMPLETE 4+ VIEW COMPARISON:  None Available. FINDINGS: Frontal, bilateral oblique, lateral views of the left knee are obtained. No fracture, subluxation, or dislocation. There is mild 3 compartmental osteoarthritis. Chondrocalcinosis within the medial and lateral compartments. No joint effusion. Soft tissues are unremarkable. IMPRESSION: 1. Mild 3 compartmental osteoarthritis.  No acute fracture. Electronically Signed   By: Sharlet Salina M.D.   On: 08/21/2023 21:33   DG Shoulder Left Result Date: 08/21/2023 CLINICAL DATA:  Larey Seat onto left shoulder, swelling EXAM: LEFT SHOULDER - 2+ VIEW COMPARISON:  None Available. FINDINGS: Internal rotation, external rotation, and transscapular views of the left shoulder are obtained. There is a  comminuted left humeral neck fracture, extending through the greater tuberosity of the left humerus. Alignment is near anatomic. No subluxation or dislocation. Moderate hypertrophic changes of the acromioclavicular joint. Superficial soft tissue swelling overlies the left shoulder. Visualized portions of the left chest are clear. IMPRESSION: 1. Comminuted left humeral neck fracture extending through the greater tuberosity. Near anatomic alignment. Electronically Signed  By: Sharlet Salina M.D.   On: 08/21/2023 21:32    Microbiology: Results for orders placed or performed during the hospital encounter of 01/29/22  Gram stain     Status: None   Collection Time: 01/29/22  8:55 AM   Specimen: Pleura  Result Value Ref Range Status   Specimen Description PLEURAL RIGHT  Final   Special Requests NONE  Final   Gram Stain   Final    NO ORGANISMS SEEN WBC PRESENT, PREDOMINANTLY MONONUCLEAR CYTOSPIN SMEAR Performed at [facility], [address], [place], [place] [address]    Report Status 01/29/2022 FINAL  Final  Culture, body fluid w Gram Stain-bottle     Status: None   Collection Time: 01/29/22  8:55 AM   Specimen: Pleura  Result Value Ref Range Status   Specimen Description PLEURAL RIGHT  Final   Special Requests BOTTLES DRAWN AEROBIC AND ANAEROBIC 10 CC  Final   Culture   Final    NO GROWTH 5 DAYS Performed at [facility], [address] [place], [place] [address]    Report Status 02/03/2022 FINAL  Final    Labs: CBC: Recent Labs  Lab 08/21/23 2143 08/22/23 0415  WBC 8.4 8.2  NEUTROABS 7.1  --   HGB 13.5 12.2*  HCT 41.8 37.1*  MCV 95.4 94.2  PLT 152 146*   Basic Metabolic Panel: Recent Labs  Lab 08/21/23 2143 08/22/23 0415 08/23/23 0739  NA 136 134* 138  K 3.7 3.3* 3.6  CL 102 102 98  CO2 21* 25 30  GLUCOSE 145* 139* 104*  BUN 12 14 14   CREATININE 0.66 0.51* 0.77  CALCIUM 9.1 8.6* 9.0   Liver Function Tests: Recent Labs  Lab 08/21/23 2143  AST 25  ALT 22   ALKPHOS 87  BILITOT 1.0  PROT 7.1  ALBUMIN 4.0   CBG: No results for input(s): "GLUCAP" in the last 168 hours.  Discharge time spent: greater than 40  minutes.  Signed: Kendell Bane, MD [facility] 08/23/2023

## 2023-08-23 NOTE — Evaluation (Signed)
 Physical Therapy Evaluation Patient Details Name: Martin [name] MRN: [id number] DOB: [birth year] Today's Date: 08/23/2023  History of Present Illness  Martin [name] is a 68 y.o. male with medical history significant of heart failure, coronary artery disease, hypertension, GERD, hyperlipidemia, atrial fibrillation, and history of CVA who presented after a mechanical fall.      Patient has not been feeling well for the last 4 to 5 days, not able to sleep, feeling very tired and fatigued. Because of dental pain he has not been able to eat well lately.   He noted dyspnea on exertion, but no lower extremity edema, PND or frank orthopnea.   Today while ambulating at his own hight he fell and landed on his left arm, producing severe pain. Because of persistent pain he came to the ED.      He endorsed being compliant with his medications. Apparently he has been drinking alcohol tonight. He does have chronic left sided weakness due to prior stroke.   His Cpap machine broke about 3 weeks ago.   Clinical Impression  Patient demonstrates labored movement for sitting up at bedside mostly due to LUE in sling, had to lean on armrest of chair during transfers, safer using quad-cane and tolerated walking in room, hallway without loss of balance.  Patient required frequent standing rest breaks mostly due to SOB and found to have SpO2 at 86% after gait training - nurse notified.  Patient tolerated sitting up in chair after therapy.  Patient will benefit from continued skilled physical therapy in hospital and recommended venue below to increase strength, balance, endurance for safe ADLs and gait.          If plan is discharge home, recommend the following: A little help with walking and/or transfers;A little help with bathing/dressing/bathroom;Help with stairs or ramp for entrance;Assistance with cooking/housework   Can travel by private vehicle        Equipment Recommendations Gilmer Mor (Wide based quad cane)   Recommendations for Other Services       Functional Status Assessment Patient has had a recent decline in their functional status and demonstrates the ability to make significant improvements in function in a reasonable and predictable amount of time.     Precautions / Restrictions Precautions Precautions: Fall Restrictions Weight Bearing Restrictions Per Provider Order: No      Mobility  Bed Mobility Overal bed mobility: Needs Assistance Bed Mobility: Supine to Sit     Supine to sit: Contact guard, Min assist     General bed mobility comments: increased time, labored movement    Transfers Overall transfer level: Needs assistance   Transfers: Sit to/from Stand, Bed to chair/wheelchair/BSC Sit to Stand: Supervision   Step pivot transfers: Supervision       General transfer comment: unsteady labored movement having to lean on armrest of chair for support    Ambulation/Gait Ambulation/Gait assistance: Supervision Gait Distance (Feet): 80 Feet Assistive device: Quad cane Gait Pattern/deviations: Decreased step length - right, Decreased step length - left, Decreased stance time - left, Ataxic, Decreased stride length Gait velocity: decreased     General Gait Details: slow slightly labored ataxic like movement of feet, decreased LLE stride length and limited mostly due to fatigue and SOB  Stairs            Wheelchair Mobility     Tilt Bed    Modified Rankin (Stroke Patients Only)       Balance Overall balance assessment: Needs assistance Sitting-balance support: Feet  supported, No upper extremity supported Sitting balance-Leahy Scale: Fair Sitting balance - Comments: fair/good seated at EOB   Standing balance support: During functional activity, No upper extremity supported Standing balance-Leahy Scale: Poor Standing balance comment: fair/poor without AD, fair/good using wide based quad cane                             Pertinent  Vitals/Pain Pain Assessment Pain Assessment: 0-10 Pain Score: 9  Pain Location: left shoulder Pain Descriptors / Indicators: Throbbing Pain Intervention(s): Limited activity within patient's tolerance, Monitored during session, Repositioned    Home Living Family/patient expects to be discharged to:: Private residence Living Arrangements: Alone Available Help at Discharge: Family;Available PRN/intermittently Type of Home: House Home Access: Stairs to enter Entrance Stairs-Rails: Left Entrance Stairs-Number of Steps: 2   Home Layout: One level Home Equipment: Grab bars - tub/shower;Cane - single Librarian, academic (2 wheels);Shower seat      Prior Function Prior Level of Function : Independent/Modified Independent;Driving             Mobility Comments: Community ambulation without AD, drives, shops ADLs Comments: Independent     Extremity/Trunk Assessment   Upper Extremity Assessment Upper Extremity Assessment: Defer to OT evaluation    Lower Extremity Assessment Lower Extremity Assessment: Generalized weakness;LLE deficits/detail LLE Deficits / Details: grossly 4/5 LLE Sensation: WNL LLE Coordination: WNL    Cervical / Trunk Assessment Cervical / Trunk Assessment: Normal  Communication   Communication Communication: No apparent difficulties    Cognition Arousal: Alert Behavior During Therapy: WFL for tasks assessed/performed   PT - Cognitive impairments: No apparent impairments                         Following commands: Intact       Cueing Cueing Techniques: Verbal cues, Tactile cues     General Comments      Exercises     Assessment/Plan    PT Assessment Patient needs continued PT services  PT Problem List Decreased strength;Decreased activity tolerance;Decreased balance;Decreased mobility;Cardiopulmonary status limiting activity       PT Treatment Interventions DME instruction;Gait training;Stair training;Functional mobility  training;Therapeutic activities;Therapeutic exercise;Balance training;Patient/family education    PT Goals (Current goals can be found in the Care Plan section)  Acute Rehab PT Goals Patient Stated Goal: return home with family to a ssist PT Goal Formulation: With patient Time For Goal Achievement: 08/26/23 Potential to Achieve Goals: Good    Frequency Min 3X/week     Co-evaluation               AM-PAC PT "6 Clicks" Mobility  Outcome Measure Help needed turning from your back to your side while in a flat bed without using bedrails?: None Help needed moving from lying on your back to sitting on the side of a flat bed without using bedrails?: A Little Help needed moving to and from a bed to a chair (including a wheelchair)?: A Little Help needed standing up from a chair using your arms (e.g., wheelchair or bedside chair)?: A Little Help needed to walk in hospital room?: A Little Help needed climbing 3-5 steps with a railing? : A Lot 6 Click Score: 18    End of Session Equipment Utilized During Treatment: Other (comment) (sling LUE) Activity Tolerance: Patient tolerated treatment well;Patient limited by fatigue Patient left: in chair;with call bell/phone within reach;with nursing/sitter in room Nurse Communication: Mobility status PT Visit  Diagnosis: Unsteadiness on feet (R26.81);Other abnormalities of gait and mobility (R26.89);Muscle weakness (generalized) (M62.81)    Time: 0930-1002 PT Time Calculation (min) (ACUTE ONLY): 32 min   Charges:   PT Evaluation $PT Eval Moderate Complexity: 1 Mod PT Treatments $Therapeutic Activity: 23-37 mins PT General Charges $$ ACUTE PT VISIT: 1 Visit        12:35 PM, 08/23/23 Ocie Bob, MPT Physical Therapist with [facility] [phone] office [phone] mobile phone

## 2023-08-23 NOTE — Progress Notes (Signed)
 SATURATION QUALIFICATIONS: (This note is used to comply with regulatory documentation for home oxygen)  Patient Saturations on Room Air at Rest = 92%  Patient Saturations on Room Air while Ambulating = 87%  Patient Saturations on 2 Liters of oxygen while Ambulating = 88%  Please briefly explain why patient needs home oxygen:oxygen decrease with activity  PT ambulated pt approx 85 feet and stated pt had to stop a few times due to shob, sats down to 87 and took at least 2 minutes to recover ra to only 91%.   Then ambulated pt while on 2L [place], same distance.  Pt had to stop 3 times to catch breath and sats were 80% when he sat then rose to 88 in several seconds then another minute recovery to get pt to 92%. Pt now on ra for 5 minutes and 93-94%. Hr minimally affected.  Dr Flossie Dibble aware

## 2023-08-23 NOTE — Plan of Care (Signed)
  Problem: Acute Rehab PT Goals(only PT should resolve) Goal: Pt Will Go Supine/Side To Sit Outcome: Progressing Flowsheets (Taken 08/23/2023 1326) Pt will go Supine/Side to Sit:  with supervision  with modified independence Goal: Patient Will Transfer Sit To/From Stand Outcome: Progressing Flowsheets (Taken 08/23/2023 1326) Patient will transfer sit to/from stand:  with modified independence  with supervision Goal: Pt Will Transfer Bed To Chair/Chair To Bed Outcome: Progressing Flowsheets (Taken 08/23/2023 1326) Pt will Transfer Bed to Chair/Chair to Bed:  with modified independence  with supervision Goal: Pt Will Ambulate Outcome: Progressing Flowsheets (Taken 08/23/2023 1326) Pt will Ambulate:  100 feet  with modified independence  with cane Note: Quad-cane   1:26 PM, 08/23/23 Ocie Bob, MPT Physical Therapist with [facility] [phone] office [phone] mobile phone

## 2023-08-23 NOTE — Care Management Important Message (Signed)
 Important Message  Patient Details  Name: Martin [name] MRN: [id number] Date of Birth: May 17, 1956   Important Message Given:  N/A - LOS <3 / Initial given by admissions     Corey Harold 08/23/2023, 11:41 AM

## 2023-08-23 NOTE — TOC Transition Note (Signed)
 Transition of Care [facility]) - Discharge Note   Patient Details  Name: Martin [name] MRN: [id number] Date of Birth: 07-01-1955  [facility]) CM/SW Contact:  Villa Herb, LCSWA Phone Number: 08/23/2023, 1:13 PM  Clinical Narrative:    CSW updated that PT is recommending HH PT and pt will need home O2. CSW spoke with pt who states he does not have agency preference for [facility] or DME. CSW spoke to [place] with [facility] who states they can accept for [facility] PT, MD placed orders. Sat qual and DME orders for O2 placed, CSW spoke to [place] with [facility] who states they accept referral. O2 tank delivered to room for pt to return home with at D/C. TOC signing off.   Final next level of care: Home w Home Health Services Barriers to Discharge: Barriers Resolved   Patient Goals and CMS Choice Patient states their goals for this hospitalization and ongoing recovery are:: return home CMS Medicare.gov Compare Post Acute Care list provided to:: Patient Choice offered to / list presented to : Patient      Discharge Placement                       Discharge Plan and Services Additional resources added to the After Visit Summary for                  DME Arranged: Oxygen DME Agency: [facility] Date DME Agency Contacted: 08/23/23   Representative spoke with at DME Agency: Ian [facility] HH Arranged: PT HH Agency: [facility] Date HH Agency Contacted: 08/23/23   Representative spoke with at [facility] Agency: Adele Dan  Social Drivers of Health (SDOH) Interventions SDOH Screenings   Food Insecurity: No Food Insecurity (08/22/2023)  Housing: Low Risk  (08/22/2023)  Transportation Needs: No Transportation Needs (08/22/2023)  Utilities: Not At Risk (08/22/2023)  Depression (PHQ2-9): Low Risk  (10/04/2019)  Social Connections: Socially Isolated (08/22/2023)  Tobacco Use: Medium Risk (08/21/2023)     Readmission Risk Interventions    08/23/2023   12:58 PM  Readmission Risk  Prevention Plan  Transportation Screening Complete  Home Care Screening Complete  Medication Review (RN CM) Complete

## 2023-08-23 NOTE — Progress Notes (Addendum)
 SATURATION QUALIFICATIONS: (This note is used to comply with regulatory documentation for home oxygen)  Patient Saturations on Room Air at Rest = 92%  Patient Saturations on Room Air while Ambulating = 87%  Patient Saturations on 2 Liters of oxygen while Ambulating = 88%  Please briefly explain why patient needs home oxygen:oxygen decrease with activity  PT ambulated pt approx 85 feet and stated pt had to stop a few times due to shob, sats down to 87 and took at least 2 minutes to recover ra to only 91%.   Then ambulated pt while on 2L [place], same distance.  Pt had to stop 3 times to catch breath and sats were 80% when he sat then rose to 88 in several seconds then another minute recovery to get pt to 92%. Pt now on ra for 5 minutes and 93-94%. Hr minimally affected.  Dr Flossie Dibble aware

## 2023-08-24 ENCOUNTER — Encounter (HOSPITAL_COMMUNITY): Payer: Self-pay

## 2023-08-24 ENCOUNTER — Ambulatory Visit (HOSPITAL_COMMUNITY): Admission: RE | Admit: 2023-08-24 | Source: Ambulatory Visit

## 2023-08-24 DIAGNOSIS — I5033 Acute on chronic diastolic (congestive) heart failure: Secondary | ICD-10-CM | POA: Diagnosis not present

## 2023-08-24 NOTE — TOC Transition Note (Addendum)
 Transition of Care [facility]) - Discharge Note   Patient Details  Name: Martin [name] MRN: [id number] Date of Birth: 06/25/1955  Transition of [facility]) CM/SW Contact:  Villa Herb, LCSWA Phone Number: 08/24/2023, 11:01 AM   Clinical Narrative:    CSW updated that [facility] is not able to accept [facility] referral at this time. CSW reached out to [place] with [facility] who states that they can accept [facility] PT referral at this time. MD placed HH orders. Agency info added to AVS for pt to review. TOC signing off.   Final next level of care: Home w Home Health Services Barriers to Discharge: Barriers Resolved   Patient Goals and CMS Choice Patient states their goals for this hospitalization and ongoing recovery are:: return home CMS Medicare.gov Compare Post Acute Care list provided to:: Patient Choice offered to / list presented to : Patient      Discharge Placement                       Discharge Plan and Services Additional resources added to the After Visit Summary for                  DME Arranged: Oxygen DME Agency: [facility] Date DME Agency Contacted: 08/23/23   Representative spoke with at DME Agency: Ian Malkin HH Arranged: PT HH Agency: [facility] Date HH Agency Contacted: 08/23/23   Representative spoke with at [facility] Agency: Adele Dan  Social Drivers of Health (SDOH) Interventions SDOH Screenings   Food Insecurity: No Food Insecurity (08/22/2023)  Housing: Low Risk  (08/22/2023)  Transportation Needs: No Transportation Needs (08/22/2023)  Utilities: Not At Risk (08/22/2023)  Depression (PHQ2-9): Low Risk  (10/04/2019)  Social Connections: Socially Isolated (08/22/2023)  Tobacco Use: Medium Risk (08/21/2023)     Readmission Risk Interventions    08/23/2023   12:58 PM  Readmission Risk Prevention Plan  Transportation Screening Complete  Home Care Screening Complete  Medication Review (RN CM) Complete

## 2023-08-24 NOTE — Care Management Important Message (Signed)
 Important Message  Patient Details  Name: Martin [name] MRN: [id number] Date of Birth: January 03, 1956   Important Message Given:  Yes - Medicare IM     Corey Harold 08/24/2023, 10:47 AM

## 2023-08-24 NOTE — Discharge Summary (Signed)
 Physician Discharge Summary   Patient: Martin [name] MRN: [id number] DOB: [birth year]  Admit date:     08/21/2023  Discharge date:   Discharge Physician: Kendell Bane   PCP: Benita Stabile, MD   The patient was seen and examined this morning, stable no acute distress Feeling much better.  Patient is cleared to be discharged home today   Home health PT and home oxygen has been arranged.    Recommendations at discharge:   Take current medication as directed Take high-dose Tylenol as directed for 3 days then as needed You may take your Oxy IR for severe pain (recommended to take it at night) Follow-up with orthopedic physician in 1 weeks Follow-up with cardiologist in 1-2 weeks, now your Lasix is a scheduled daily, monitor your daily weight, elevate lower extremities while asleep  Discharge Diagnoses: Principal Problem:   Acute on chronic diastolic CHF (congestive heart failure) (HCC) Active Problems:   Closed left humeral fracture   Essential hypertension   Persistent atrial fibrillation (HCC)   CAD (coronary artery disease)   Dyslipidemia   Obesity, class 2  Resolved Problems:   * No resolved hospital problems. *  Hospital Course:  Martin [name] is a 68 y.o. male with medical history significant of Heart Failure, CAD, HTN, GERD, HLD, Atrial fibrillation, and CVA -who presented after a mechanical fall.   Patient has not been feeling well for the last 4 to 5 days, not able to sleep, feeling very tired and fatigued. Because of dental pain he has not been able to eat well lately.  He noted dyspnea on exertion, but no lower extremity edema, PND or frank orthopnea.  While ambulating at his own hight he fell and landed on his left arm, producing severe pain. Because of persistent pain he came to the ED.  He endorsed being compliant with his medications. Apparently he has been drinking alcohol tonight. He does have chronic left sided weakness due to prior stroke.  His  Cpap machine broke about 3 weeks ago.     * Acute on chronic diastolic CHF (congestive heart failure) (HCC) - On 2L of oxygen 99% -BNP 859 >> 459, -Monitoring daily weight, I's and O's Filed Weights   08/22/23 0548 08/23/23 0317 08/24/23 0513  Weight: 103.3 kg 103.1 kg 100.6 kg    Intake/Output Summary (Last 24 hours) at 08/24/2023 1211 Last data filed at 08/24/2023 0900 Gross per 24 hour  Intake 240 ml  Output 1670 ml  Net -1430 ml   12/2022 echocardiogram with preserved LV systolic function EF 50 to 55%, mild dilatated LV cavity, mild LVH, RV systolic function preserved. LA and RA with moderate dilatation, mild aortic valve stenosis.    Patient with mild volume overload.    -Will continue diuresis with furosemide 60 mg IV bid to target negative fluid balance.  - Continue Entresto, Empagliflozin and Spironolactone.   08/24/2023 Echocardiogram;  Left Ventricle: Left ventricular ejection fraction, by estimation, is 50 to 55%. The left ventricle has low normal function. The left ventricle  demonstrates global hypokinesis. Definity contrast agent was given IV to delineate the left ventricular  endocardial borders. The left ventricular internal cavity size was normal in size. There is mild concentric left ventricular hypertrophy. Left  ventricular diastolic parameters are indeterminate.    Closed left humeral fracture Stable -Sling was placed in the ED.  -Continue pain control with hydrocodone and will add morphine for sever pain.  - Follow up with orthopedics as outpatient.  Essential hypertension Continue blood pressure control with Entresto and spironolactone Lasix   Persistent atrial fibrillation (HCC) -Monitoring on telemetry rate controlled, continue apixaban    CAD (coronary artery disease) -Denies chest pain, continue current meds, statins, apixaban, not on antiplatelet therapy      Dyslipidemia Continue statin with atorvastatin.    Obesity, class  2 Calculated BMI is 37.2       Pain control - [employer] Controlled Substance Reporting System database was reviewed. and patient was instructed, not to drive, operate heavy machinery, perform activities at heights, swimming or participation in water activities or provide baby-sitting services while on Pain, Sleep and Anxiety Medications; until their outpatient Physician has advised to do so again. Also recommended to not to take more than prescribed Pain, Sleep and Anxiety Medications.  Consultants: Curbside Ortho Procedures performed: Echocardiogram Disposition: Home Diet recommendation:  Discharge Diet Orders (From admission, onward)     Start     Ordered   08/23/23 0000  Diet - low sodium heart healthy        08/23/23 0931           Cardiac diet DISCHARGE MEDICATION: Allergies as of 08/24/2023       Reactions   Penicillins Hives, Rash        Medication List     STOP taking these medications    colchicine 0.6 MG tablet   HYDROcodone-acetaminophen 10-325 MG tablet Commonly known as: NORCO       TAKE these medications    acetaminophen 500 MG tablet Commonly known as: TYLENOL Take 2 tablets (1,000 mg total) by mouth every 8 (eight) hours.   allopurinol 300 MG tablet Commonly known as: ZYLOPRIM Take 300 mg by mouth daily.   atorvastatin 80 MG tablet Commonly known as: LIPITOR TAKE ONE TABLET BY MOUTH EVERY DAY   diclofenac Sodium 1 % Gel Commonly known as: VOLTAREN Apply 4 g topically 4 (four) times daily as needed (Pain).   docusate sodium 100 MG capsule Commonly known as: COLACE Take 2 capsules (200 mg total) by mouth daily. What changed:  how much to take when to take this reasons to take this   Eliquis 5 MG Tabs tablet Generic drug: apixaban TAKE ONE TABLET BY MOUTH TWICE DAILY   Entresto 24-26 MG Generic drug: sacubitril-valsartan TAKE ONE TABLET BY MOUTH TWICE DAILY   escitalopram 10 MG tablet Commonly known as: LEXAPRO Take 10  mg by mouth daily.   fluticasone 50 MCG/ACT nasal spray Commonly known as: FLONASE Place 2 sprays into the nose daily.   folic acid 1 MG tablet Commonly known as: FOLVITE TAKE ONE TABLET BY MOUTH EVERY DAY   furosemide 40 MG tablet Commonly known as: LASIX Take 1 tablet (40 mg total) by mouth daily. What changed:  when to take this reasons to take this   Jardiance 10 MG Tabs tablet Generic drug: empagliflozin TAKE 1 TABLET BY MOUTH EVERY DAY BEFORE BREAKFAST   loratadine 10 MG tablet Commonly known as: CLARITIN Take 10 mg by mouth daily.   LORazepam 0.5 MG tablet Commonly known as: ATIVAN Take 0.5 mg by mouth at bedtime.   meclizine 32 MG tablet Commonly known as: ANTIVERT Take 32 mg by mouth daily as needed for dizziness.   nitroGLYCERIN 0.4 MG SL tablet Commonly known as: NITROSTAT Place 1 tablet (0.4 mg total) under the tongue every 5 (five) minutes as needed for chest pain.   oxyCODONE 5 MG immediate release tablet Commonly known as: Oxy IR/ROXICODONE  Take 1 tablet (5 mg total) by mouth every 6 (six) hours as needed for up to 3 days for severe pain (pain score 7-10).   pantoprazole 40 MG tablet Commonly known as: PROTONIX Take 1 tablet (40 mg total) by mouth daily.   polyethylene glycol 17 g packet Commonly known as: MIRALAX / GLYCOLAX Take 17 g by mouth at bedtime as needed for mild constipation.   spironolactone 25 MG tablet Commonly known as: ALDACTONE TAKE 1/2 TABLET BY MOUTH DAILY   tamsulosin 0.4 MG Caps capsule Commonly known as: FLOMAX Take 0.4 mg by mouth at bedtime.   Vitamin B-12 2500 MCG Subl Place 2,500 mcg under the tongue every morning.   Vitamin D-3 25 MCG (1000 UT) Caps Take 1,000 Units by mouth every morning.               Durable Medical Equipment  (From admission, onward)           Start     Ordered   08/23/23 1032  For home use only DME oxygen  Once       Question Answer Comment  Length of Need 6 Months   Mode  or (Route) Nasal cannula   Liters per Minute 2   Frequency Continuous (stationary and portable oxygen unit needed)   Oxygen conserving device Yes   Oxygen delivery system Gas      08/23/23 1032            Follow-up Information     [facility] Follow up.   Why: Home health agency will call to set up first home visit. Contact information: [address] [place] [address] [phone]                Discharge Exam: Filed Weights   08/22/23 0548 08/23/23 0317 08/24/23 0513  Weight: 103.3 kg 103.1 kg 100.6 kg         General:  AAO x 3,  cooperative, no distress;   HEENT:  Normocephalic, PERRL, otherwise with in Normal limits   Neuro:  CNII-XII intact. , normal motor and sensation, reflexes intact   Lungs:   Clear to auscultation BL, Respirations unlabored,  No wheezes / crackles  Cardio:    S1/S2, RRR, No murmure, No Rubs or Gallops   Abdomen:  Soft, non-tender, bowel sounds active all four quadrants, no guarding or peritoneal signs.  Muscular  skeletal:  Left arm in sling, due to humerus fracture, negative for any edema,  sensation intact Limited exam -global generalized weaknesses - in bed, able to move all 4 extremities,   2+ pulses,  symmetric, No pitting edema  Skin:  Dry, warm to touch, negative for any Rashes,  Wounds: Please see nursing documentation          Condition at discharge: good  The results of significant diagnostics from this hospitalization (including imaging, microbiology, ancillary and laboratory) are listed below for reference.   Imaging Studies: ECHOCARDIOGRAM COMPLETE Result Date: 08/22/2023    ECHOCARDIOGRAM REPORT   Patient Name:   Martin [name] Date of Exam: 08/22/2023 Medical Rec #:  [id number]         Height:       66.0 in Accession #:    [id number]        Weight:       227.7 lb Date of Birth:  09-03-1955         BSA:  2.113 m Patient Age:    65 years          BP:           126/77 mmHg  Patient Gender: M                 HR:           69 bpm. [facility] Procedure: 2D Echo, Cardiac Doppler, Color Doppler and Intracardiac            Opacification Agent (Both Spectral and Color Flow Doppler were            utilized during procedure). Indications:    Dyspnea R06.00  History:        Patient has prior history of Echocardiogram examinations, most                 recent 12/08/2022. CHF, CAD and Previous Myocardial Infarction,                 Prior CABG, Arrythmias:Bradycardia; Risk Factors:Hypertension                 and Dyslipidemia.  Sonographer:    Celesta Gentile RCS Referring Phys: [id number] [facility] ARRIEN  Sonographer Comments: Technically difficult study due to poor echo windows. Patient fell at home and injured his left shoulder. Is in a sling and has alot of pain when moving left arm. IMPRESSIONS  1. Left ventricular ejection fraction, by estimation, is 50 to 55%. The left ventricle has low normal function. The left ventricle demonstrates global hypokinesis. There is mild concentric left ventricular hypertrophy. Left ventricular diastolic parameters are indeterminate.  2. Right ventricular systolic function is normal. The right ventricular size is normal. Tricuspid regurgitation signal is inadequate for assessing PA pressure.  3. Left atrial size was severely dilated.  4. Right atrial size was severely dilated.  5. The mitral valve is normal in structure. No evidence of mitral valve regurgitation. No evidence of mitral stenosis.  6. The aortic valve is normal in structure. Aortic valve regurgitation is not visualized. No aortic stenosis is present.  7. The inferior vena cava is normal in size with greater than 50% respiratory variability, suggesting right atrial pressure of 3 mmHg. FINDINGS  Left Ventricle: Left ventricular ejection fraction, by estimation, is 50 to 55%. The left ventricle has low normal function. The left ventricle demonstrates global hypokinesis. Definity  contrast agent was given IV to delineate the left ventricular endocardial borders. The left ventricular internal cavity size was normal in size. There is mild concentric left ventricular hypertrophy. Left ventricular diastolic parameters are indeterminate. Right Ventricle: The right ventricular size is normal. No increase in right ventricular wall thickness. Right ventricular systolic function is normal. Tricuspid regurgitation signal is inadequate for assessing PA pressure. Left Atrium: Left atrial size was severely dilated. Right Atrium: Right atrial size was severely dilated. Pericardium: There is no evidence of pericardial effusion. Mitral Valve: The mitral valve is normal in structure. No evidence of mitral valve regurgitation. No evidence of mitral valve stenosis. Tricuspid Valve: The tricuspid valve is normal in structure. Tricuspid valve regurgitation is not demonstrated. No evidence of tricuspid stenosis. Aortic Valve: The aortic valve is normal in structure. Aortic valve regurgitation is not visualized. No aortic stenosis is present. Aortic valve mean gradient measures 13.5 mmHg. Aortic valve peak gradient measures 25.6 mmHg. Aortic valve area, by VTI measures 1.01 cm. Pulmonic Valve: The pulmonic valve was normal in structure. Pulmonic valve regurgitation is  not visualized. No evidence of pulmonic stenosis. Aorta: The aortic root is normal in size and structure. Venous: The inferior vena cava is normal in size with greater than 50% respiratory variability, suggesting right atrial pressure of 3 mmHg. IAS/Shunts: No atrial level shunt detected by color flow Doppler.  LEFT VENTRICLE PLAX 2D LVIDd:         6.10 cm LVIDs:         4.30 cm LV PW:         1.10 cm LV IVS:        1.10 cm LVOT diam:     2.00 cm LV SV:         49 LV SV Index:   23 LVOT Area:     3.14 cm  RIGHT VENTRICLE TAPSE (M-mode): 2.3 cm LEFT ATRIUM              Index        RIGHT ATRIUM           Index LA diam:        4.70 cm  2.22 cm/m   RA  Area:     33.00 cm LA Vol (A2C):   166.0 ml 78.55 ml/m  RA Volume:   120.00 ml 56.78 ml/m LA Vol (A4C):   111.0 ml 52.52 ml/m LA Biplane Vol: 137.0 ml 64.83 ml/m  AORTIC VALVE AV Area (Vmax):    1.00 cm AV Area (Vmean):   1.08 cm AV Area (VTI):     1.01 cm AV Vmax:           253.00 cm/s AV Vmean:          164.500 cm/s AV VTI:            0.484 m AV Peak Grad:      25.6 mmHg AV Mean Grad:      13.5 mmHg LVOT Vmax:         80.70 cm/s LVOT Vmean:        56.500 cm/s LVOT VTI:          0.155 m LVOT/AV VTI ratio: 0.32  AORTA Ao Root diam: 3.70 cm MITRAL VALVE MV Area (PHT): 3.91 cm     SHUNTS MV Decel Time: 194 msec     Systemic VTI:  0.16 m MV E velocity: 102.00 cm/s  Systemic Diam: 2.00 cm Kardie Tobb DO Electronically signed by Thomasene Ripple DO Signature Date/Time: 08/22/2023/2:36:03 PM    Final    DG Chest Port 1 View Result Date: 08/21/2023 CLINICAL DATA:  Shortness of breath. EXAM: PORTABLE CHEST 1 VIEW COMPARISON:  May 26, 2023 FINDINGS: Multiple sternal wires are noted. The cardiac silhouette is moderately enlarged and unchanged in size. There is prominence of the central pulmonary vasculature. Mild diffusely increased interstitial lung markings are seen. Stable, mild linear scarring and/or atelectasis is present within the mid left lung and left lung base. There are small, stable bilateral pleural effusions. No pneumothorax is identified. Multilevel degenerative changes are seen throughout the thoracic spine. IMPRESSION: 1. Stable cardiomegaly with mild interstitial edema and small bilateral pleural effusions. 2. Stable mid left lung and left basilar linear scarring and/or atelectasis. Electronically Signed   By: Aram Candela M.D.   On: 08/21/2023 22:30   DG Knee Complete 4 Views Left Result Date: 08/21/2023 CLINICAL DATA:  Larey Seat, swelling EXAM: LEFT KNEE - COMPLETE 4+ VIEW COMPARISON:  None Available. FINDINGS: Frontal, bilateral oblique, lateral views of the left knee are obtained. No  fracture,  subluxation, or dislocation. There is mild 3 compartmental osteoarthritis. Chondrocalcinosis within the medial and lateral compartments. No joint effusion. Soft tissues are unremarkable. IMPRESSION: 1. Mild 3 compartmental osteoarthritis.  No acute fracture. Electronically Signed   By: Sharlet Salina M.D.   On: 08/21/2023 21:33   DG Shoulder Left Result Date: 08/21/2023 CLINICAL DATA:  Larey Seat onto left shoulder, swelling EXAM: LEFT SHOULDER - 2+ VIEW COMPARISON:  None Available. FINDINGS: Internal rotation, external rotation, and transscapular views of the left shoulder are obtained. There is a comminuted left humeral neck fracture, extending through the greater tuberosity of the left humerus. Alignment is near anatomic. No subluxation or dislocation. Moderate hypertrophic changes of the acromioclavicular joint. Superficial soft tissue swelling overlies the left shoulder. Visualized portions of the left chest are clear. IMPRESSION: 1. Comminuted left humeral neck fracture extending through the greater tuberosity. Near anatomic alignment. Electronically Signed   By: Sharlet Salina M.D.   On: 08/21/2023 21:32    Microbiology: Results for orders placed or performed during the hospital encounter of 01/29/22  Gram stain     Status: None   Collection Time: 01/29/22  8:55 AM   Specimen: Pleura  Result Value Ref Range Status   Specimen Description PLEURAL RIGHT  Final   Special Requests NONE  Final   Gram Stain   Final    NO ORGANISMS SEEN WBC PRESENT, PREDOMINANTLY MONONUCLEAR CYTOSPIN SMEAR Performed at [facility], [address], [place], [place] [address]    Report Status 01/29/2022 FINAL  Final  Culture, body fluid w Gram Stain-bottle     Status: None   Collection Time: 01/29/22  8:55 AM   Specimen: Pleura  Result Value Ref Range Status   Specimen Description PLEURAL RIGHT  Final   Special Requests BOTTLES DRAWN AEROBIC AND ANAEROBIC 10 CC  Final   Culture   Final    NO GROWTH 5  DAYS Performed at [facility], [address], [place], [place] [address]    Report Status 02/03/2022 FINAL  Final    Labs: CBC: Recent Labs  Lab 08/21/23 2143 08/22/23 0415  WBC 8.4 8.2  NEUTROABS 7.1  --   HGB 13.5 12.2*  HCT 41.8 37.1*  MCV 95.4 94.2  PLT 152 146*   Basic Metabolic Panel: Recent Labs  Lab 08/21/23 2143 08/22/23 0415 08/23/23 0739  NA 136 134* 138  K 3.7 3.3* 3.6  CL 102 102 98  CO2 21* 25 30  GLUCOSE 145* 139* 104*  BUN 12 14 14   CREATININE 0.66 0.51* 0.77  CALCIUM 9.1 8.6* 9.0   Liver Function Tests: Recent Labs  Lab 08/21/23 2143  AST 25  ALT 22  ALKPHOS 87  BILITOT 1.0  PROT 7.1  ALBUMIN 4.0   CBG: No results for input(s): "GLUCAP" in the last 168 hours.  Discharge time spent: greater than 40  minutes.  Signed: Kendell Bane, MD [facility] 08/24/2023

## 2023-08-24 NOTE — Plan of Care (Signed)

## 2023-08-25 ENCOUNTER — Telehealth: Payer: Self-pay

## 2023-08-25 ENCOUNTER — Other Ambulatory Visit: Payer: Self-pay | Admitting: Cardiology

## 2023-08-25 NOTE — Transitions of Care (Post Inpatient/ED Visit) (Signed)
 08/25/2023  Name: Martin [name] MRN: [id number] DOB: 07/27/1955  Today's TOC FU Call Status: Today's TOC FU Call Status:: Successful TOC FU Call Completed TOC FU Call Complete Date: 08/25/23 Patient's Name and Date of Birth confirmed.  Transition Care Management Follow-up Telephone Call Date of Discharge: 08/24/23 [facility] Type of Discharge: Inpatient Admission Primary Inpatient Discharge Diagnosis:: Acute on chronic diastolic CHF (congestive heart failure How have you been since you were released from the hospital?: Same (reporting arm pain due to sling) Any questions or concerns?: No  Items Reviewed: Did you receive and understand the discharge instructions provided?: Yes Medications obtained,verified, and reconciled?: Yes (Medications Reviewed) (Medication reconciliation completed based on recent discharge summary Patient taking medications as instructed and is aware of any changes or dosage adjustments medication regimen. Patient denies questions and reports no barriers to medication adherence) Any new allergies since your discharge?: No Dietary orders reviewed?: Yes Type of Diet Ordered:: Reh Heart Healthy Do you have support at home?: Yes People in Home: alone Name of Support/Comfort Primary Source: Son Jill Alexanders and Daughter Corrie Dandy  Medications Reviewed Today: Medications Reviewed Today     Reviewed by Johnnette Barrios, RN (Registered Nurse) on 08/25/23 at 1434  Med List Status: <None>   Medication Order Taking? Sig Documenting Provider Last Dose Status Informant  acetaminophen (TYLENOL) 500 MG tablet [id number] Yes Take 2 tablets (1,000 mg total) by mouth every 8 (eight) hours. Kendell Bane, MD Taking Active   allopurinol (ZYLOPRIM) 300 MG tablet [id number] Yes Take 300 mg by mouth daily.  [provider] Taking Active Self, Pharmacy Records  apixaban (ELIQUIS) 5 MG TABS tablet [id number] Yes TAKE ONE TABLET BY MOUTH TWICE DAILY  Jonelle Sidle, MD Taking Active Self, Pharmacy Records  atorvastatin (LIPITOR) 80 MG tablet [id number] Yes TAKE ONE TABLET BY MOUTH EVERY DAY Jonelle Sidle, MD Taking Active Self, Pharmacy Records  Cholecalciferol (VITAMIN D-3) 1000 UNITS CAPS [id number] Yes Take 1,000 Units by mouth every morning.  [provider] Taking Active Self, Pharmacy Records  Cyanocobalamin (VITAMIN B-12) 2500 MCG SUBL [id number] Yes Place 2,500 mcg under the tongue every morning.  [provider] Taking Active Self, Pharmacy Records  diclofenac Sodium (VOLTAREN) 1 % GEL [id number] Yes Apply 4 g topically 4 (four) times daily as needed (Pain). [provider] Taking Active Self, Pharmacy Records           Med Note Lum Keas Aug 22, 2023  2:11 PM) >30 days   docusate sodium (COLACE) 100 MG capsule [id number] Yes Take 2 capsules (200 mg total) by mouth daily.  Patient taking differently: Take 100 mg by mouth at bedtime as needed.   Love, Evlyn Kanner, PA-C Taking Active Self, Pharmacy Records           Med Note Lum Keas Aug 22, 2023  2:11 PM) >30 days   [place] [address] [place] [id number] Yes TAKE ONE TABLET BY MOUTH TWICE DAILY Jonelle Sidle, MD Taking Active Self, Pharmacy Records  escitalopram (LEXAPRO) 10 MG tablet [id number] Yes Take 10 mg by mouth daily. [provider] Taking Active Self, Pharmacy Records  fluticasone [facility]) 50 MCG/ACT nasal spray [id number] Yes Place 2 sprays into the nose daily. [provider] Taking Active Self, Pharmacy Records           Med Note Meredith Pel Nov 12, 2021  1:37 PM)    folic  acid (FOLVITE) 1 MG tablet [id number] Yes TAKE ONE TABLET BY MOUTH EVERY DAY de Melanie Crazier, MD Taking Active Self, Pharmacy Records  furosemide (LASIX) 40 MG tablet [id number] Yes Take 1 tablet (40 mg total) by mouth daily. Kendell Bane, MD Taking Active            Med Note Sharon Seller, Armonie Mettler L   Wed Aug 25, 2023  2:34 PM)  He did not take today  JARDIANCE 10 MG TABS tablet [id number] Yes TAKE 1 TABLET BY MOUTH EVERY DAY BEFORE BREAKFAST Jonelle Sidle, MD Taking Active   loratadine (CLARITIN) 10 MG tablet [id number] Yes Take 10 mg by mouth daily. [provider] Taking Active Self, Pharmacy Records  LORazepam (ATIVAN) 0.5 MG tablet [id number] Yes Take 0.5 mg by mouth at bedtime.  [provider] Taking Active Self, Pharmacy Records  meclizine (ANTIVERT) 32 MG tablet [id number] Yes Take 32 mg by mouth daily as needed for dizziness.  [provider] Taking Active Self, Pharmacy Records  nitroGLYCERIN (NITROSTAT) 0.4 MG SL tablet [id number] Yes Place 1 tablet (0.4 mg total) under the tongue every 5 (five) minutes as needed for chest pain. Jonelle Sidle, MD Taking Active Self, Pharmacy Records  oxyCODONE (OXY IR/ROXICODONE) 5 MG immediate release tablet [id number] Yes Take 1 tablet (5 mg total) by mouth every 6 (six) hours as needed for up to 3 days for severe pain (pain score 7-10). Kendell Bane, MD Taking Active   pantoprazole (PROTONIX) 40 MG tablet [id number] Yes Take 1 tablet (40 mg total) by mouth daily. Jacquelynn Cree, PA-C Taking Active Self, Pharmacy Records  polyethylene glycol [facility] / GLYCOLAX) packet [id number] Yes Take 17 g by mouth at bedtime as needed for mild constipation. [provider] Taking Active Self, Pharmacy Records  spironolactone (ALDACTONE) 25 MG tablet [id number] Yes TAKE 1/2 TABLET BY MOUTH DAILY Jonelle Sidle, MD Taking Active Self, Pharmacy Records  tamsulosin [facility]) 0.4 MG CAPS capsule [id number] Yes Take 0.4 mg by mouth at bedtime. [provider] Taking Active Self, Pharmacy Records          Medication reconciliation / review completed based on most recent discharge summary and EHR medication list. Confirmed patient is taking all newly prescribed medications as instructed (any discrepancies are noted in review section)   Patient /  Caregiver is aware of any changes to and / or  any dosage adjustments to medication regimen. Patient/ Caregiver denies questions at this time and reports no barriers to medication adherence.   Reviewed noted changes and updates with him  START taking: acetaminophen (TYLENOL) oxyCODONE (Oxy IR/ROXICODONE) CHANGE how you take: furosemide (LASIX) STOP taking: colchicine 0.6 MG tablet   HYDROcodone-acetaminophen 10-325 MG tablet (NORCO) Take pre hospital medication as directed Take high-dose Tylenol as directed for 3 days then as needed   Home Care and Equipment/Supplies: Were Home Health Services Ordered?: Yes Name of Home Health Agency:: [facility] [phone] Has Agency set up a time to come to your home?: No EMR reviewed for Home Health Orders: Orders present/patient has not received call (refer to CM for follow-up) (Called Home Health Spoke to Leda Quail have referral [facility] 3/21 will assess need for OT for ADL's and consider SN) Any new equipment or medical supplies ordered?: Yes Name of Medical supply agency?: [employer]  [phone] Were you able to get the equipment/medical supplies?: Yes (oxygen and supplies CPAP  adjustment pending) Do you have any questions related to the use of the equipment/supplies?: Yes  What questions do you have?: He needed his CPAP adjusted ( He is using spouses) this has prev been addressed with Provider and is in process  Functional Questionnaire: Do you need assistance with bathing/showering or dressing?: Yes (reports difficulty due to HX CVA) Do you need assistance with meal preparation?: No Do you need assistance with eating?: No Do you have difficulty maintaining continence: No Do you need assistance with getting out of bed/getting out of a chair/moving?: No Do you have difficulty managing or taking your medications?: Yes (He self adjusts at times)  Follow up appointments reviewed: PCP Follow-up appointment confirmed?: No MD Provider Line  Number:[phone] Given: No (Independent Provider He will call and scheduke) Specialist Hospital Follow-up appointment confirmed?: Yes Date of Specialist follow-up appointment?: 11/16/23 Follow-Up Specialty Provider:: Pulmonology and 7/10 Cardiologist Do you need transportation to your follow-up appointment?: No Do you understand care options if your condition(s) worsen?: Yes-patient verbalized understanding  SDOH Interventions Today    Flowsheet Row Most Recent Value  SDOH Interventions   Food Insecurity Interventions Intervention Not Indicated  Housing Interventions Intervention Not Indicated  Transportation Interventions Intervention Not Indicated, Patient Resources (Friends/Family), Payor Benefit  Utilities Interventions Intervention Not Indicated      Interventions Today    Flowsheet Row Most Recent Value  Chronic Disease   Chronic disease during today's visit Congestive Heart Failure (CHF)  General Interventions   General Interventions Discussed/Reviewed General Interventions Discussed, General Interventions Reviewed, Doctor Visits  Doctor Visits Discussed/Reviewed PCP, Specialist, Doctor Visits Reviewed, Doctor Visits Discussed  PCP/Specialist Visits Compliance with follow-up visit  Exercise Interventions   Exercise Discussed/Reviewed Exercise Discussed, Exercise Reviewed, Physical Activity  Physical Activity Discussed/Reviewed Physical Activity Reviewed, Physical Activity Discussed  Education Interventions   Education Provided Provided Education  Mental Health Interventions   Mental Health Discussed/Reviewed Coping Strategies, Anxiety  Nutrition Interventions   Nutrition Discussed/Reviewed Nutrition Discussed, Nutrition Reviewed, Decreasing salt, Decreasing fats, Fluid intake  Pharmacy Interventions   Pharmacy Dicussed/Reviewed Pharmacy Topics Discussed, Pharmacy Topics Reviewed, Medications and their functions, Medication Adherence  Medication Adherence Not taking  medication  Safety Interventions   Safety Discussed/Reviewed Safety Reviewed, Fall Risk        Benefits reviewed  Based on current information and Insurance plan -Reviewed benefits accessible to patient, including details about eligibility options for care and  available value based care options  if any areas of needs were identified.  Reviewed patient/  caregiver's ability to access and / or  ability with navigating the benefits system..Amb Referral made if indicted , refer to orders section of note for details   Reviewed goals for care Patient  and / or Caregiveverbalizes understanding of instructions and care plan provided. Patient / Caregiver was encouraged to make informed decisions about their care, actively participate in managing their health condition, and implement lifestyle changes as needed to promote independence and self-management of health care. There were no reported  barriers to care.   TOC program  Patient is at  risk for readmission and / or has history of  high utilization  Discussed VBCI  TOC program and weekly calls to patient to assess condition/status, medication management  and provide support/education as indicated . Patient  and / or Caregive voiced understanding and declined enrollment in the 30-day TOC Program at this time .   He has strong family support. He has Home Health set up [facility] 3[facility]. They will assess need for adding OT and possible SN. He will Follow-up with orthopedic physician  in 1 weeks -Maintain sling and to use padding around neck keep arm in proper alignment  He will schedule visit with Dr Margo Aye for Post hospital He has visits scheduled with Pulmonology and Cardiology They are working on changing settings for CPAP ( spouses) . He has home  O 2 and supplies will only use PRN at this time He felt he was doing ok and with Home health he did not need additional follow-up   The Patient  and / or Caregiver  has been provided with contact  information for the care management team and has been advised to call with any health-related questions or concerns. Patient was encouraged to Contact PCP with any questions or concerns regarding ongoing medical care, any difficulty obtaining or picking up prescriptions, any changes or worsening in condition including signs / symptoms not relieved  with interventions Patient had no additional questions or concerns at this time.    Susa Loffler , BSN, RN [facility]   VBCI-Population Health RN Care Manager Direct Dial [phone]  Fax: [phone] Website: [email or web address]

## 2023-08-25 NOTE — Transitions of Care (Post Inpatient/ED Visit) (Signed)
   08/25/2023  Name: ARLON BLEIER MRN: [id number] DOB: [birth year]  Today's TOC FU Call Status: Today's TOC FU Call Status:: Unsuccessful Call (2nd Attempt) Unsuccessful Call (1st Attempt) Date: 08/25/23 Unsuccessful Call (2nd Attempt) Date: 08/25/23 (patient called and LM requesting a call back for [facility])  Attempted to reach the patient regarding the most recent Inpatient/ED visit. Patient was called in an Outreach attempt to offer VBCI  30-day TOC program. Pt is eligible for program due to potential risk for readmission and/or high utilization. Unfortunately, I was not able to speak with the patient in regards to recent hospital discharge. Patient returned call left a message requesting a call back Called patient -no answer    Left a HIPAA compliant phone message for patient including VBCI CM contact information with request for a call back in regard to recent hospital discharge  Follow Up Plan: Additional outreach attempts will be made to reach the patient to complete the Transitions of Care (Post Inpatient/ED visit) call.   Susa Loffler , BSN, RN [facility]   VBCI-Population Health RN Care Manager Direct Dial [phone]  Fax: [phone] Website: [email or web address]

## 2023-08-25 NOTE — Transitions of Care (Post Inpatient/ED Visit) (Signed)
   08/25/2023  Name: Martin [name] MRN: [id number] DOB: 09-Nov-1955  Today's TOC FU Call Status: Today's TOC FU Call Status:: Unsuccessful Call (1st Attempt) Unsuccessful Call (1st Attempt) Date: 08/25/23  Attempted to reach the patient regarding the most recent Inpatient/ED visit. Patient was called in an Outreach attempt to offer VBCI  30-day TOC program. Pt is eligible for program due to potential risk for readmission and/or high utilization. Unfortunately, I was not able to speak with the patient in regards to recent hospital discharge   Left a HIPAA compliant phone message for patient including VBCI CM contact information with request for a call back in regard to recent hospital discharge    Follow Up Plan: Additional outreach attempts will be made to reach the patient to complete the Transitions of Care (Post Inpatient/ED visit) call.   Susa Loffler , BSN, RN [facility]   VBCI-Population Health RN Care Manager Direct Dial [phone]  Fax: [phone] Website: [email or web address]

## 2023-08-26 NOTE — Telephone Encounter (Signed)
 Patient is asking if he can pick up his HST at the [place] office instead of having to drive to [place]---call back [phone]

## 2023-08-26 NOTE — Progress Notes (Signed)
 CSW received call from pt on 3/20 requesting update from [facility]. CSW reached out to [place] with [facility] to request they call pt about when services will start.

## 2023-08-26 NOTE — Telephone Encounter (Signed)
 Called pt. Pt is unable to make it to the office and has requested to do SNAP instead of HST.

## 2023-08-30 ENCOUNTER — Telehealth: Payer: Self-pay

## 2023-08-30 NOTE — Patient Outreach (Signed)
 Care Management  Transitions of Care Program Transitions of Care Post-discharge Care Coordination    08/30/2023 Name: Martin [name] MRN: [id number] DOB: May 15, 1956  Subjective: Martin [name] is a 68 y.o. year old male who is a primary care patient of Benita Stabile, MD. The Care Management team Engaged with patient Engaged with patient by telephone to assess and address transitions of care needs.   Patient called regarding Home Health services . He stated he had not heard from [facility] I reviewed SOC dates and asked if he had heard from the [employer] He stated he had ot but that if they had not left a message he would not have checked his phone and it was also possible he missed the call.Noted he has a note in EPIC that a provider was trying to reach him Provided him with contact info for both Providers and He stated he was going to call after our call. He also stated he was seeing his PCP in the a, and will follow-up with any referrals that may need to be resubmitted    Plan:  Follow-up with [facility] for Home Health PT/OT and consider SN Attend appointment with Dr Margo Aye 08/31/23   Susa Loffler , BSN, RN [facility]   VBCI-Population Health RN Care Manager Direct Dial [phone]  Fax: [phone] Website: [email or web address]

## 2023-08-31 DIAGNOSIS — I5033 Acute on chronic diastolic (congestive) heart failure: Secondary | ICD-10-CM | POA: Diagnosis not present

## 2023-08-31 DIAGNOSIS — I110 Hypertensive heart disease with heart failure: Secondary | ICD-10-CM | POA: Diagnosis not present

## 2023-08-31 DIAGNOSIS — S42302A Unspecified fracture of shaft of humerus, left arm, initial encounter for closed fracture: Secondary | ICD-10-CM | POA: Diagnosis not present

## 2023-08-31 DIAGNOSIS — Z7689 Persons encountering health services in other specified circumstances: Secondary | ICD-10-CM | POA: Diagnosis not present

## 2023-09-02 ENCOUNTER — Telehealth: Payer: Self-pay

## 2023-09-02 NOTE — Transitions of Care (Post Inpatient/ED Visit) (Signed)
   09/02/2023  Name: Martin [name] MRN: [id number] DOB: 10-Nov-1955  Patient called regarding Home Health services . He stated he had not heard from [place] [phone] Provided him with contact info for Providers and He stated he was going to call after our call.   Plan:  Follow-up with [facility] for Home Health PT/OT and request  SN He stated he had an appointment with Dr Margo Aye 08/31/23 and a new referral for Home Health but is unsure of which Provider Recommend he call PCP office to check where the referral was sent  Call placed to Dr Scharlene Gloss office regarding the referral status Spoke to [facility] - on review The Referral Coordinator has not processed the request at this time- requested they follow-up with the patient.   Susa Loffler , BSN, RN [facility]   VBCI-Population Health RN Care Manager Direct Dial [phone]  Fax: [phone] Website: [email or web address]

## 2023-09-06 ENCOUNTER — Other Ambulatory Visit (INDEPENDENT_AMBULATORY_CARE_PROVIDER_SITE_OTHER)

## 2023-09-06 ENCOUNTER — Ambulatory Visit (INDEPENDENT_AMBULATORY_CARE_PROVIDER_SITE_OTHER): Admitting: Orthopedic Surgery

## 2023-09-06 VITALS — BP 130/80 | HR 72 | Ht 67.0 in | Wt 228.0 lb

## 2023-09-06 DIAGNOSIS — S4292XA Fracture of left shoulder girdle, part unspecified, initial encounter for closed fracture: Secondary | ICD-10-CM | POA: Diagnosis not present

## 2023-09-06 NOTE — Addendum Note (Signed)
 Addended by: Debby Bud R on: 09/06/2023 04:41 PM   Modules accepted: Orders

## 2023-09-06 NOTE — Addendum Note (Signed)
 Addended by: Debby Bud R on: 09/06/2023 03:55 PM   Modules accepted: Orders

## 2023-09-06 NOTE — Progress Notes (Signed)
  Intake history:  There were no vitals taken for this visit. There is no height or weight on file to calculate BMI.    WHAT ARE WE SEEING YOU FOR TODAY?   left shoulder  How long has this bothered you? (DOI?DOS?WS?)  SINCE 3/15 week(s) ago  Anticoag.  Yes  Diabetes No  Heart disease Yes  Hypertension No  SMOKING HX No  Kidney disease Yes  Any ALLERGIES ______________________________________________   Treatment:  Have you taken:  Tylenol No  Advil No  Had PT No  Had injection No  Other  _________________________

## 2023-09-06 NOTE — Progress Notes (Addendum)
 Patient ID: Martin [name], male   DOB: Jun 20, 1955, 68 y.o.   MRN: [id number]    Patient ID: Martin [name], male   DOB: March 28, 1956, 68 y.o.   MRN: [id number]  ASSESSMENT AND PLAN :  68 year old male with medical problems listed below with left proximal humerus fracture good candidate for nonoperative treatment however, the patient lives alone will need home health to help with meals  And then therapy can begin full range of motion full weightbearing advance as tolerated without restrictions  Return 2 months for x-ray and check range of motion    Chief Complaint  Patient presents with   Shoulder Pain    Left shoulder fracture, DOI 08/21/23.    HPI Martin [name] is a 68 y.o. male.  ER follow-up left shoulder proximal humerus fracture 67 YO m FELL FEEDING THE CAT   Injury date March 15  ER visit X-rays proximal humerus fracture left shoulder  Sling  Oxycodone  Now hydrocodone  Still aching   Shoulder Pain     Review of Systems Review of Systems, noncontributory   Past Medical History:  Diagnosis Date   (HFpEF) heart failure with preserved ejection fraction (HCC)    CAD (coronary artery disease)    a. s/p PCI to LAD 2001. b. s/p stenting to the RCA and mid LAD December 2011, residual distal RCA disease treated medically. c.  Multivessel disease status post CABG October 2020   Carotid artery disease (HCC)    Essential hypertension    GERD (gastroesophageal reflux disease)    Habitual alcohol use    Hyperlipidemia    Low back pain    Myocardial infarction (HCC) 2020   OSA on CPAP    Osteoarthritis    Permanent atrial fibrillation (HCC)    a. dx 10/2017   Polysubstance abuse (HCC)    History of marijuana and Vicodin abuse   PVC's (premature ventricular contractions)    Seasonal allergies    Stroke [facility]) 2020    Past Surgical History:  Procedure Laterality Date   CARDIOVERSION N/A 11/19/2017   Procedure: CARDIOVERSION;  Surgeon: Laqueta Linden, MD;  [facility];  Service: Cardiovascular;  Laterality: N/A;   COLONOSCOPY WITH PROPOFOL N/A 03/01/2020   Procedure: COLONOSCOPY WITH PROPOFOL;  Surgeon: Dolores Frame, MD;  Location: [facility];  Service: Gastroenterology;  Laterality: N/A;  730   CORONARY ANGIOPLASTY WITH STENT PLACEMENT  2011   CORONARY ARTERY BYPASS GRAFT N/A 03/10/2019   Procedure: CORONARY ARTERY BYPASS GRAFTING (CABG)X 3 Using Left Internal Mammary Artery and Right Saphenous Vein Grafts;  Surgeon: Kerin Perna, MD;  Location: [facility] OR;  Service: Open Heart Surgery;  Laterality: N/A;   ESOPHAGOGASTRODUODENOSCOPY (EGD) WITH PROPOFOL N/A 03/01/2020   Procedure: ESOPHAGOGASTRODUODENOSCOPY (EGD) WITH PROPOFOL;  Surgeon: Dolores Frame, MD;  [facility];  Service: Gastroenterology;  Laterality: N/A;   LEFT HEART CATH AND CORONARY ANGIOGRAPHY N/A 03/08/2019   Procedure: LEFT HEART CATH AND CORONARY ANGIOGRAPHY;  Surgeon: Lyn Records, MD;  [facility]: [facility];  Service: Cardiovascular;  Laterality: N/A;   POLYPECTOMY  03/01/2020   Procedure: POLYPECTOMY;  Surgeon: Dolores Frame, MD;  [facility];  Service: Gastroenterology;;   TEE WITHOUT CARDIOVERSION N/A 03/10/2019   Procedure: TRANSESOPHAGEAL ECHOCARDIOGRAM (TEE);  Surgeon: Donata Clay, Theron Arista, MD;  Location: [facility] OR;  Service: Open Heart Surgery;  Laterality: N/A;   TONSILLECTOMY      Family History  Problem Relation  Age of Onset   Stroke Mother    Other Mother        small bowel obstruction   Heart attack Father    Heart attack Brother    Healthy Son     Social History Social History   Tobacco Use   Smoking status: Former    Current packs/day: 0.00    Average packs/day: 1 pack/day for 35.0 years (35.0 ttl pk-yrs)    Types: Cigarettes    Start date: 09/18/1967    Quit date: 06/09/1999    Years since quitting: 24.2   Smokeless tobacco: Never  Vaping Use   Vaping status: Never Used   Substance Use Topics   Alcohol use: Not Currently    Alcohol/week: 0.0 standard drinks of alcohol    Comment: .stopped October on 2020   Drug use: Yes    Types: Marijuana    Comment: last smoked 2 months ago    Allergies  Allergen Reactions   Penicillins Hives and Rash    Current Outpatient Medications  Medication Sig Dispense Refill   acetaminophen (TYLENOL) 500 MG tablet Take 2 tablets (1,000 mg total) by mouth every 8 (eight) hours. 30 tablet 0   allopurinol (ZYLOPRIM) 300 MG tablet Take 300 mg by mouth daily.      apixaban (ELIQUIS) 5 MG TABS tablet TAKE ONE TABLET BY MOUTH TWICE DAILY 180 tablet 1   atorvastatin (LIPITOR) 80 MG tablet TAKE ONE TABLET BY MOUTH EVERY DAY 90 tablet 1   Cholecalciferol (VITAMIN D-3) 1000 UNITS CAPS Take 1,000 Units by mouth every morning.      Cyanocobalamin (VITAMIN B-12) 2500 MCG SUBL Place 2,500 mcg under the tongue every morning.      diclofenac Sodium (VOLTAREN) 1 % GEL Apply 4 g topically 4 (four) times daily as needed (Pain).     docusate sodium (COLACE) 100 MG capsule Take 2 capsules (200 mg total) by mouth daily. (Patient taking differently: Take 100 mg by mouth at bedtime as needed.) 60 capsule 0   ENTRESTO 24-26 MG TAKE ONE TABLET BY MOUTH TWICE DAILY 60 tablet 11   escitalopram (LEXAPRO) 10 MG tablet Take 10 mg by mouth daily.     fluticasone (FLONASE) 50 MCG/ACT nasal spray Place 2 sprays into the nose daily.     folic acid (FOLVITE) 1 MG tablet TAKE ONE TABLET BY MOUTH EVERY DAY 30 tablet 6   furosemide (LASIX) 40 MG tablet Take 1 tablet (40 mg total) by mouth daily.     JARDIANCE 10 MG TABS tablet TAKE 1 TABLET BY MOUTH EVERY DAY BEFORE BREAKFAST 90 tablet 1   loratadine (CLARITIN) 10 MG tablet Take 10 mg by mouth daily.     LORazepam (ATIVAN) 0.5 MG tablet Take 0.5 mg by mouth at bedtime.      meclizine (ANTIVERT) 32 MG tablet Take 32 mg by mouth daily as needed for dizziness.      nitroGLYCERIN (NITROSTAT) 0.4 MG SL tablet Place 1  tablet (0.4 mg total) under the tongue every 5 (five) minutes as needed for chest pain. 25 tablet 3   pantoprazole (PROTONIX) 40 MG tablet Take 1 tablet (40 mg total) by mouth daily. 30 tablet 0   polyethylene glycol (MIRALAX / GLYCOLAX) packet Take 17 g by mouth at bedtime as needed for mild constipation.     spironolactone (ALDACTONE) 25 MG tablet TAKE 1/2 TABLET BY MOUTH DAILY 45 tablet 3   tamsulosin (FLOMAX) 0.4 MG CAPS capsule Take 0.4 mg by mouth  at bedtime.     No current facility-administered medications for this visit.       Physical Exam BP 130/80   Pulse 72   Ht 5\' 7"  (1.702 m)   Wt 228 lb (103.4 kg)   BMI 35.71 kg/m  Body mass index is 35.71 kg/m.   The patient is well developed well nourished and well groomed.  Orientation to person place and time is normal  Mood is pleasant.  Ambulatory status independent without device   Left shoulder  Examination: Inspection of the shoulder shows that there is ecchymosis of the skin proximal humerus down into the upper arm. Tenderness at the proximal humerus and fracture site is noted.  Range of motion examination is deferred because of pain.  Motor exam hand wrist elbow normal including normal muscle tone.  Shoulder stability tests deferred because of fracture, elbow stable wrist stable.  Neurovascular examination is intact and the lymph nodes in the axilla and supraclavicular regions are normal   The opposite shoulder has no swelling, normal range of motion, no joint contracture subluxation atrophy tremor or skin lesion. Neurovascular exam is intact.  Reading independent interpretation Outside image shows a comminuted proximal humerus fracture left shoulder   Imaging today shows a proximal humerus fracture nondisplaced  DG Shoulder Left Result Date: 09/06/2023 Left shoulder x-ray to evaluate proximal humerus fracture.  Slight comminution of the left proximal humerus with transverse fracture and then a part of the  greater tuberosity is also involved but the fracture looks similar to the previous films dated March 15 This is a nonoperative fracture pattern

## 2023-09-08 ENCOUNTER — Encounter

## 2023-09-08 DIAGNOSIS — G4730 Sleep apnea, unspecified: Secondary | ICD-10-CM | POA: Diagnosis not present

## 2023-09-10 ENCOUNTER — Other Ambulatory Visit: Payer: Self-pay | Admitting: Orthopedic Surgery

## 2023-09-10 ENCOUNTER — Telehealth: Payer: Self-pay | Admitting: Orthopedic Surgery

## 2023-09-10 MED ORDER — HYDROCODONE-ACETAMINOPHEN 5-325 MG PO TABS: 1.0000 | ORAL_TABLET | Freq: Four times a day (QID) | ORAL | 0 refills | Status: DC | PRN

## 2023-09-10 NOTE — Telephone Encounter (Signed)
 Dr. Mort Sawyers pt - spoke w/the pt, he stated he ran out of the Oxycodone that the ED prescribed.  He is requesting more to be sent to [facility].

## 2023-09-15 ENCOUNTER — Other Ambulatory Visit: Payer: Self-pay | Admitting: Orthopedic Surgery

## 2023-09-16 ENCOUNTER — Telehealth: Payer: Self-pay | Admitting: Orthopedic Surgery

## 2023-09-16 DIAGNOSIS — I4819 Other persistent atrial fibrillation: Secondary | ICD-10-CM | POA: Diagnosis not present

## 2023-09-16 DIAGNOSIS — E785 Hyperlipidemia, unspecified: Secondary | ICD-10-CM | POA: Diagnosis not present

## 2023-09-16 DIAGNOSIS — Z7951 Long term (current) use of inhaled steroids: Secondary | ICD-10-CM | POA: Diagnosis not present

## 2023-09-16 DIAGNOSIS — J302 Other seasonal allergic rhinitis: Secondary | ICD-10-CM | POA: Diagnosis not present

## 2023-09-16 DIAGNOSIS — I110 Hypertensive heart disease with heart failure: Secondary | ICD-10-CM | POA: Diagnosis not present

## 2023-09-16 DIAGNOSIS — Z955 Presence of coronary angioplasty implant and graft: Secondary | ICD-10-CM | POA: Diagnosis not present

## 2023-09-16 DIAGNOSIS — G4700 Insomnia, unspecified: Secondary | ICD-10-CM | POA: Diagnosis not present

## 2023-09-16 DIAGNOSIS — S42302D Unspecified fracture of shaft of humerus, left arm, subsequent encounter for fracture with routine healing: Secondary | ICD-10-CM | POA: Diagnosis not present

## 2023-09-16 DIAGNOSIS — I252 Old myocardial infarction: Secondary | ICD-10-CM | POA: Diagnosis not present

## 2023-09-16 DIAGNOSIS — I2510 Atherosclerotic heart disease of native coronary artery without angina pectoris: Secondary | ICD-10-CM | POA: Diagnosis not present

## 2023-09-16 DIAGNOSIS — M109 Gout, unspecified: Secondary | ICD-10-CM | POA: Diagnosis not present

## 2023-09-16 DIAGNOSIS — I779 Disorder of arteries and arterioles, unspecified: Secondary | ICD-10-CM | POA: Diagnosis not present

## 2023-09-16 DIAGNOSIS — D509 Iron deficiency anemia, unspecified: Secondary | ICD-10-CM | POA: Diagnosis not present

## 2023-09-16 DIAGNOSIS — Z7901 Long term (current) use of anticoagulants: Secondary | ICD-10-CM | POA: Diagnosis not present

## 2023-09-16 DIAGNOSIS — Z8673 Personal history of transient ischemic attack (TIA), and cerebral infarction without residual deficits: Secondary | ICD-10-CM | POA: Diagnosis not present

## 2023-09-16 DIAGNOSIS — I5033 Acute on chronic diastolic (congestive) heart failure: Secondary | ICD-10-CM | POA: Diagnosis not present

## 2023-09-16 DIAGNOSIS — Z87891 Personal history of nicotine dependence: Secondary | ICD-10-CM | POA: Diagnosis not present

## 2023-09-16 DIAGNOSIS — Z951 Presence of aortocoronary bypass graft: Secondary | ICD-10-CM | POA: Diagnosis not present

## 2023-09-16 DIAGNOSIS — G4733 Obstructive sleep apnea (adult) (pediatric): Secondary | ICD-10-CM | POA: Diagnosis not present

## 2023-09-16 DIAGNOSIS — M1990 Unspecified osteoarthritis, unspecified site: Secondary | ICD-10-CM | POA: Diagnosis not present

## 2023-09-16 DIAGNOSIS — I493 Ventricular premature depolarization: Secondary | ICD-10-CM | POA: Diagnosis not present

## 2023-09-16 NOTE — Telephone Encounter (Signed)
 Dr. Mort Sawyers pt - spoke w/Stacey [facility] w/[facility] [phone], she is requesting verbal orders for PT 1w8w.  She also stated that the pt is having trouble w/his ADL's and is requesting an order for OT and a HH aide for 2w2w and then drop it down to 1w6w.

## 2023-09-21 DIAGNOSIS — Z87891 Personal history of nicotine dependence: Secondary | ICD-10-CM | POA: Diagnosis not present

## 2023-09-21 DIAGNOSIS — I493 Ventricular premature depolarization: Secondary | ICD-10-CM | POA: Diagnosis not present

## 2023-09-21 DIAGNOSIS — I110 Hypertensive heart disease with heart failure: Secondary | ICD-10-CM | POA: Diagnosis not present

## 2023-09-21 DIAGNOSIS — Z7901 Long term (current) use of anticoagulants: Secondary | ICD-10-CM | POA: Diagnosis not present

## 2023-09-21 DIAGNOSIS — Z951 Presence of aortocoronary bypass graft: Secondary | ICD-10-CM | POA: Diagnosis not present

## 2023-09-21 DIAGNOSIS — M109 Gout, unspecified: Secondary | ICD-10-CM | POA: Diagnosis not present

## 2023-09-21 DIAGNOSIS — Z955 Presence of coronary angioplasty implant and graft: Secondary | ICD-10-CM | POA: Diagnosis not present

## 2023-09-21 DIAGNOSIS — I4819 Other persistent atrial fibrillation: Secondary | ICD-10-CM | POA: Diagnosis not present

## 2023-09-21 DIAGNOSIS — J302 Other seasonal allergic rhinitis: Secondary | ICD-10-CM | POA: Diagnosis not present

## 2023-09-21 DIAGNOSIS — M1990 Unspecified osteoarthritis, unspecified site: Secondary | ICD-10-CM | POA: Diagnosis not present

## 2023-09-21 DIAGNOSIS — I779 Disorder of arteries and arterioles, unspecified: Secondary | ICD-10-CM | POA: Diagnosis not present

## 2023-09-21 DIAGNOSIS — Z8673 Personal history of transient ischemic attack (TIA), and cerebral infarction without residual deficits: Secondary | ICD-10-CM | POA: Diagnosis not present

## 2023-09-21 DIAGNOSIS — I2510 Atherosclerotic heart disease of native coronary artery without angina pectoris: Secondary | ICD-10-CM | POA: Diagnosis not present

## 2023-09-21 DIAGNOSIS — I252 Old myocardial infarction: Secondary | ICD-10-CM | POA: Diagnosis not present

## 2023-09-21 DIAGNOSIS — E785 Hyperlipidemia, unspecified: Secondary | ICD-10-CM | POA: Diagnosis not present

## 2023-09-21 DIAGNOSIS — G4733 Obstructive sleep apnea (adult) (pediatric): Secondary | ICD-10-CM | POA: Diagnosis not present

## 2023-09-21 DIAGNOSIS — S42302D Unspecified fracture of shaft of humerus, left arm, subsequent encounter for fracture with routine healing: Secondary | ICD-10-CM | POA: Diagnosis not present

## 2023-09-21 DIAGNOSIS — G4700 Insomnia, unspecified: Secondary | ICD-10-CM | POA: Diagnosis not present

## 2023-09-21 DIAGNOSIS — I5033 Acute on chronic diastolic (congestive) heart failure: Secondary | ICD-10-CM | POA: Diagnosis not present

## 2023-09-21 DIAGNOSIS — D509 Iron deficiency anemia, unspecified: Secondary | ICD-10-CM | POA: Diagnosis not present

## 2023-09-21 DIAGNOSIS — Z7951 Long term (current) use of inhaled steroids: Secondary | ICD-10-CM | POA: Diagnosis not present

## 2023-09-23 ENCOUNTER — Other Ambulatory Visit (HOSPITAL_COMMUNITY): Payer: Self-pay | Admitting: Internal Medicine

## 2023-09-23 DIAGNOSIS — I493 Ventricular premature depolarization: Secondary | ICD-10-CM | POA: Diagnosis not present

## 2023-09-23 DIAGNOSIS — E785 Hyperlipidemia, unspecified: Secondary | ICD-10-CM | POA: Diagnosis not present

## 2023-09-23 DIAGNOSIS — S42302D Unspecified fracture of shaft of humerus, left arm, subsequent encounter for fracture with routine healing: Secondary | ICD-10-CM | POA: Diagnosis not present

## 2023-09-23 DIAGNOSIS — G4700 Insomnia, unspecified: Secondary | ICD-10-CM | POA: Diagnosis not present

## 2023-09-23 DIAGNOSIS — I779 Disorder of arteries and arterioles, unspecified: Secondary | ICD-10-CM | POA: Diagnosis not present

## 2023-09-23 DIAGNOSIS — M1990 Unspecified osteoarthritis, unspecified site: Secondary | ICD-10-CM | POA: Diagnosis not present

## 2023-09-23 DIAGNOSIS — I110 Hypertensive heart disease with heart failure: Secondary | ICD-10-CM | POA: Diagnosis not present

## 2023-09-23 DIAGNOSIS — Z955 Presence of coronary angioplasty implant and graft: Secondary | ICD-10-CM | POA: Diagnosis not present

## 2023-09-23 DIAGNOSIS — Z7951 Long term (current) use of inhaled steroids: Secondary | ICD-10-CM | POA: Diagnosis not present

## 2023-09-23 DIAGNOSIS — I5033 Acute on chronic diastolic (congestive) heart failure: Secondary | ICD-10-CM | POA: Diagnosis not present

## 2023-09-23 DIAGNOSIS — I4819 Other persistent atrial fibrillation: Secondary | ICD-10-CM | POA: Diagnosis not present

## 2023-09-23 DIAGNOSIS — Z7901 Long term (current) use of anticoagulants: Secondary | ICD-10-CM | POA: Diagnosis not present

## 2023-09-23 DIAGNOSIS — I872 Venous insufficiency (chronic) (peripheral): Secondary | ICD-10-CM

## 2023-09-23 DIAGNOSIS — I2510 Atherosclerotic heart disease of native coronary artery without angina pectoris: Secondary | ICD-10-CM | POA: Diagnosis not present

## 2023-09-23 DIAGNOSIS — G4733 Obstructive sleep apnea (adult) (pediatric): Secondary | ICD-10-CM | POA: Diagnosis not present

## 2023-09-23 DIAGNOSIS — M109 Gout, unspecified: Secondary | ICD-10-CM | POA: Diagnosis not present

## 2023-09-23 DIAGNOSIS — Z87891 Personal history of nicotine dependence: Secondary | ICD-10-CM | POA: Diagnosis not present

## 2023-09-23 DIAGNOSIS — Z8673 Personal history of transient ischemic attack (TIA), and cerebral infarction without residual deficits: Secondary | ICD-10-CM | POA: Diagnosis not present

## 2023-09-23 DIAGNOSIS — I252 Old myocardial infarction: Secondary | ICD-10-CM | POA: Diagnosis not present

## 2023-09-23 DIAGNOSIS — D509 Iron deficiency anemia, unspecified: Secondary | ICD-10-CM | POA: Diagnosis not present

## 2023-09-23 DIAGNOSIS — J302 Other seasonal allergic rhinitis: Secondary | ICD-10-CM | POA: Diagnosis not present

## 2023-09-23 DIAGNOSIS — Z951 Presence of aortocoronary bypass graft: Secondary | ICD-10-CM | POA: Diagnosis not present

## 2023-09-27 ENCOUNTER — Telehealth (HOSPITAL_BASED_OUTPATIENT_CLINIC_OR_DEPARTMENT_OTHER): Payer: Self-pay

## 2023-09-27 ENCOUNTER — Encounter (HOSPITAL_BASED_OUTPATIENT_CLINIC_OR_DEPARTMENT_OTHER): Payer: Self-pay

## 2023-09-27 DIAGNOSIS — G4733 Obstructive sleep apnea (adult) (pediatric): Secondary | ICD-10-CM

## 2023-09-27 NOTE — Telephone Encounter (Signed)
 Copied from CRM [id number]. Topic: Clinical - Lab/Test Results >> Sep 27, 2023 11:30 AM Hilton Lucky wrote: Reason for CRM: Patient calling in to request sleep study results. Please update patient.

## 2023-09-28 ENCOUNTER — Telehealth: Payer: Self-pay | Admitting: Orthopedic Surgery

## 2023-09-28 DIAGNOSIS — I5033 Acute on chronic diastolic (congestive) heart failure: Secondary | ICD-10-CM | POA: Diagnosis not present

## 2023-09-28 DIAGNOSIS — I110 Hypertensive heart disease with heart failure: Secondary | ICD-10-CM | POA: Diagnosis not present

## 2023-09-28 DIAGNOSIS — M109 Gout, unspecified: Secondary | ICD-10-CM | POA: Diagnosis not present

## 2023-09-28 DIAGNOSIS — Z951 Presence of aortocoronary bypass graft: Secondary | ICD-10-CM | POA: Diagnosis not present

## 2023-09-28 DIAGNOSIS — I4819 Other persistent atrial fibrillation: Secondary | ICD-10-CM | POA: Diagnosis not present

## 2023-09-28 DIAGNOSIS — I2510 Atherosclerotic heart disease of native coronary artery without angina pectoris: Secondary | ICD-10-CM | POA: Diagnosis not present

## 2023-09-28 DIAGNOSIS — E785 Hyperlipidemia, unspecified: Secondary | ICD-10-CM | POA: Diagnosis not present

## 2023-09-28 DIAGNOSIS — Z8673 Personal history of transient ischemic attack (TIA), and cerebral infarction without residual deficits: Secondary | ICD-10-CM | POA: Diagnosis not present

## 2023-09-28 DIAGNOSIS — Z7951 Long term (current) use of inhaled steroids: Secondary | ICD-10-CM | POA: Diagnosis not present

## 2023-09-28 DIAGNOSIS — Z7901 Long term (current) use of anticoagulants: Secondary | ICD-10-CM | POA: Diagnosis not present

## 2023-09-28 DIAGNOSIS — G4733 Obstructive sleep apnea (adult) (pediatric): Secondary | ICD-10-CM | POA: Diagnosis not present

## 2023-09-28 DIAGNOSIS — Z87891 Personal history of nicotine dependence: Secondary | ICD-10-CM | POA: Diagnosis not present

## 2023-09-28 DIAGNOSIS — G4700 Insomnia, unspecified: Secondary | ICD-10-CM | POA: Diagnosis not present

## 2023-09-28 DIAGNOSIS — I493 Ventricular premature depolarization: Secondary | ICD-10-CM | POA: Diagnosis not present

## 2023-09-28 DIAGNOSIS — M1990 Unspecified osteoarthritis, unspecified site: Secondary | ICD-10-CM | POA: Diagnosis not present

## 2023-09-28 DIAGNOSIS — D509 Iron deficiency anemia, unspecified: Secondary | ICD-10-CM | POA: Diagnosis not present

## 2023-09-28 DIAGNOSIS — I779 Disorder of arteries and arterioles, unspecified: Secondary | ICD-10-CM | POA: Diagnosis not present

## 2023-09-28 DIAGNOSIS — Z955 Presence of coronary angioplasty implant and graft: Secondary | ICD-10-CM | POA: Diagnosis not present

## 2023-09-28 DIAGNOSIS — J302 Other seasonal allergic rhinitis: Secondary | ICD-10-CM | POA: Diagnosis not present

## 2023-09-28 DIAGNOSIS — S42302D Unspecified fracture of shaft of humerus, left arm, subsequent encounter for fracture with routine healing: Secondary | ICD-10-CM | POA: Diagnosis not present

## 2023-09-28 DIAGNOSIS — I252 Old myocardial infarction: Secondary | ICD-10-CM | POA: Diagnosis not present

## 2023-09-28 NOTE — Telephone Encounter (Signed)
 Dr. Delfino Fellers pt - spoke w/[place] w/[facility] [phone], she is requesting verbal orders for OT 2w2w and 1w4w.

## 2023-09-29 NOTE — Telephone Encounter (Signed)
 I called to give Verbal order s

## 2023-09-30 DIAGNOSIS — G4700 Insomnia, unspecified: Secondary | ICD-10-CM | POA: Diagnosis not present

## 2023-09-30 DIAGNOSIS — I2510 Atherosclerotic heart disease of native coronary artery without angina pectoris: Secondary | ICD-10-CM | POA: Diagnosis not present

## 2023-09-30 DIAGNOSIS — G4733 Obstructive sleep apnea (adult) (pediatric): Secondary | ICD-10-CM | POA: Diagnosis not present

## 2023-09-30 DIAGNOSIS — E785 Hyperlipidemia, unspecified: Secondary | ICD-10-CM | POA: Diagnosis not present

## 2023-09-30 DIAGNOSIS — M109 Gout, unspecified: Secondary | ICD-10-CM | POA: Diagnosis not present

## 2023-09-30 DIAGNOSIS — Z8673 Personal history of transient ischemic attack (TIA), and cerebral infarction without residual deficits: Secondary | ICD-10-CM | POA: Diagnosis not present

## 2023-09-30 DIAGNOSIS — I493 Ventricular premature depolarization: Secondary | ICD-10-CM | POA: Diagnosis not present

## 2023-09-30 DIAGNOSIS — I5033 Acute on chronic diastolic (congestive) heart failure: Secondary | ICD-10-CM | POA: Diagnosis not present

## 2023-09-30 DIAGNOSIS — M1990 Unspecified osteoarthritis, unspecified site: Secondary | ICD-10-CM | POA: Diagnosis not present

## 2023-09-30 DIAGNOSIS — I779 Disorder of arteries and arterioles, unspecified: Secondary | ICD-10-CM | POA: Diagnosis not present

## 2023-09-30 DIAGNOSIS — Z7901 Long term (current) use of anticoagulants: Secondary | ICD-10-CM | POA: Diagnosis not present

## 2023-09-30 DIAGNOSIS — I4819 Other persistent atrial fibrillation: Secondary | ICD-10-CM | POA: Diagnosis not present

## 2023-09-30 DIAGNOSIS — I110 Hypertensive heart disease with heart failure: Secondary | ICD-10-CM | POA: Diagnosis not present

## 2023-09-30 DIAGNOSIS — R069 Unspecified abnormalities of breathing: Secondary | ICD-10-CM

## 2023-09-30 DIAGNOSIS — Z7951 Long term (current) use of inhaled steroids: Secondary | ICD-10-CM | POA: Diagnosis not present

## 2023-09-30 DIAGNOSIS — J302 Other seasonal allergic rhinitis: Secondary | ICD-10-CM | POA: Diagnosis not present

## 2023-09-30 DIAGNOSIS — Z87891 Personal history of nicotine dependence: Secondary | ICD-10-CM | POA: Diagnosis not present

## 2023-09-30 DIAGNOSIS — Z955 Presence of coronary angioplasty implant and graft: Secondary | ICD-10-CM | POA: Diagnosis not present

## 2023-09-30 DIAGNOSIS — I252 Old myocardial infarction: Secondary | ICD-10-CM | POA: Diagnosis not present

## 2023-09-30 DIAGNOSIS — D509 Iron deficiency anemia, unspecified: Secondary | ICD-10-CM | POA: Diagnosis not present

## 2023-09-30 DIAGNOSIS — Z951 Presence of aortocoronary bypass graft: Secondary | ICD-10-CM | POA: Diagnosis not present

## 2023-09-30 DIAGNOSIS — S42302D Unspecified fracture of shaft of humerus, left arm, subsequent encounter for fracture with routine healing: Secondary | ICD-10-CM | POA: Diagnosis not present

## 2023-10-04 DIAGNOSIS — I872 Venous insufficiency (chronic) (peripheral): Secondary | ICD-10-CM | POA: Diagnosis not present

## 2023-10-04 DIAGNOSIS — Z713 Dietary counseling and surveillance: Secondary | ICD-10-CM | POA: Diagnosis not present

## 2023-10-05 DIAGNOSIS — Z8673 Personal history of transient ischemic attack (TIA), and cerebral infarction without residual deficits: Secondary | ICD-10-CM | POA: Diagnosis not present

## 2023-10-05 DIAGNOSIS — D509 Iron deficiency anemia, unspecified: Secondary | ICD-10-CM | POA: Diagnosis not present

## 2023-10-05 DIAGNOSIS — I779 Disorder of arteries and arterioles, unspecified: Secondary | ICD-10-CM | POA: Diagnosis not present

## 2023-10-05 DIAGNOSIS — G4733 Obstructive sleep apnea (adult) (pediatric): Secondary | ICD-10-CM | POA: Diagnosis not present

## 2023-10-05 DIAGNOSIS — I2510 Atherosclerotic heart disease of native coronary artery without angina pectoris: Secondary | ICD-10-CM | POA: Diagnosis not present

## 2023-10-05 DIAGNOSIS — I5033 Acute on chronic diastolic (congestive) heart failure: Secondary | ICD-10-CM | POA: Diagnosis not present

## 2023-10-05 DIAGNOSIS — Z87891 Personal history of nicotine dependence: Secondary | ICD-10-CM | POA: Diagnosis not present

## 2023-10-05 DIAGNOSIS — M109 Gout, unspecified: Secondary | ICD-10-CM | POA: Diagnosis not present

## 2023-10-05 DIAGNOSIS — Z955 Presence of coronary angioplasty implant and graft: Secondary | ICD-10-CM | POA: Diagnosis not present

## 2023-10-05 DIAGNOSIS — I4819 Other persistent atrial fibrillation: Secondary | ICD-10-CM | POA: Diagnosis not present

## 2023-10-05 DIAGNOSIS — I110 Hypertensive heart disease with heart failure: Secondary | ICD-10-CM | POA: Diagnosis not present

## 2023-10-05 DIAGNOSIS — Z951 Presence of aortocoronary bypass graft: Secondary | ICD-10-CM | POA: Diagnosis not present

## 2023-10-05 DIAGNOSIS — I493 Ventricular premature depolarization: Secondary | ICD-10-CM | POA: Diagnosis not present

## 2023-10-05 DIAGNOSIS — G4700 Insomnia, unspecified: Secondary | ICD-10-CM | POA: Diagnosis not present

## 2023-10-05 DIAGNOSIS — I252 Old myocardial infarction: Secondary | ICD-10-CM | POA: Diagnosis not present

## 2023-10-05 DIAGNOSIS — Z7901 Long term (current) use of anticoagulants: Secondary | ICD-10-CM | POA: Diagnosis not present

## 2023-10-05 DIAGNOSIS — E785 Hyperlipidemia, unspecified: Secondary | ICD-10-CM | POA: Diagnosis not present

## 2023-10-05 DIAGNOSIS — M1990 Unspecified osteoarthritis, unspecified site: Secondary | ICD-10-CM | POA: Diagnosis not present

## 2023-10-05 DIAGNOSIS — J302 Other seasonal allergic rhinitis: Secondary | ICD-10-CM | POA: Diagnosis not present

## 2023-10-05 DIAGNOSIS — Z7951 Long term (current) use of inhaled steroids: Secondary | ICD-10-CM | POA: Diagnosis not present

## 2023-10-05 DIAGNOSIS — S42302D Unspecified fracture of shaft of humerus, left arm, subsequent encounter for fracture with routine healing: Secondary | ICD-10-CM | POA: Diagnosis not present

## 2023-10-06 ENCOUNTER — Ambulatory Visit (HOSPITAL_COMMUNITY)
Admission: RE | Admit: 2023-10-06 | Discharge: 2023-10-06 | Disposition: A | Discharge status: Home / Self Care | Source: Ambulatory Visit | Attending: Internal Medicine | Admitting: Internal Medicine

## 2023-10-06 DIAGNOSIS — I739 Peripheral vascular disease, unspecified: Secondary | ICD-10-CM | POA: Diagnosis not present

## 2023-10-06 DIAGNOSIS — I872 Venous insufficiency (chronic) (peripheral): Secondary | ICD-10-CM | POA: Diagnosis not present

## 2023-10-06 NOTE — Telephone Encounter (Signed)
 Can we make sure this has been processed for Replacement CPAP

## 2023-10-06 NOTE — Telephone Encounter (Signed)
**Note De-identified  [place] Obfuscation** Please advise 

## 2023-10-07 DIAGNOSIS — G4733 Obstructive sleep apnea (adult) (pediatric): Secondary | ICD-10-CM | POA: Diagnosis not present

## 2023-10-07 DIAGNOSIS — M109 Gout, unspecified: Secondary | ICD-10-CM | POA: Diagnosis not present

## 2023-10-07 DIAGNOSIS — Z7951 Long term (current) use of inhaled steroids: Secondary | ICD-10-CM | POA: Diagnosis not present

## 2023-10-07 DIAGNOSIS — Z951 Presence of aortocoronary bypass graft: Secondary | ICD-10-CM | POA: Diagnosis not present

## 2023-10-07 DIAGNOSIS — Z87891 Personal history of nicotine dependence: Secondary | ICD-10-CM | POA: Diagnosis not present

## 2023-10-07 DIAGNOSIS — I252 Old myocardial infarction: Secondary | ICD-10-CM | POA: Diagnosis not present

## 2023-10-07 DIAGNOSIS — I2510 Atherosclerotic heart disease of native coronary artery without angina pectoris: Secondary | ICD-10-CM | POA: Diagnosis not present

## 2023-10-07 DIAGNOSIS — I493 Ventricular premature depolarization: Secondary | ICD-10-CM | POA: Diagnosis not present

## 2023-10-07 DIAGNOSIS — I110 Hypertensive heart disease with heart failure: Secondary | ICD-10-CM | POA: Diagnosis not present

## 2023-10-07 DIAGNOSIS — I779 Disorder of arteries and arterioles, unspecified: Secondary | ICD-10-CM | POA: Diagnosis not present

## 2023-10-07 DIAGNOSIS — G4700 Insomnia, unspecified: Secondary | ICD-10-CM | POA: Diagnosis not present

## 2023-10-07 DIAGNOSIS — I5033 Acute on chronic diastolic (congestive) heart failure: Secondary | ICD-10-CM | POA: Diagnosis not present

## 2023-10-07 DIAGNOSIS — Z955 Presence of coronary angioplasty implant and graft: Secondary | ICD-10-CM | POA: Diagnosis not present

## 2023-10-07 DIAGNOSIS — D509 Iron deficiency anemia, unspecified: Secondary | ICD-10-CM | POA: Diagnosis not present

## 2023-10-07 DIAGNOSIS — J302 Other seasonal allergic rhinitis: Secondary | ICD-10-CM | POA: Diagnosis not present

## 2023-10-07 DIAGNOSIS — I4819 Other persistent atrial fibrillation: Secondary | ICD-10-CM | POA: Diagnosis not present

## 2023-10-07 DIAGNOSIS — M1990 Unspecified osteoarthritis, unspecified site: Secondary | ICD-10-CM | POA: Diagnosis not present

## 2023-10-07 DIAGNOSIS — Z8673 Personal history of transient ischemic attack (TIA), and cerebral infarction without residual deficits: Secondary | ICD-10-CM | POA: Diagnosis not present

## 2023-10-07 DIAGNOSIS — S42302D Unspecified fracture of shaft of humerus, left arm, subsequent encounter for fracture with routine healing: Secondary | ICD-10-CM | POA: Diagnosis not present

## 2023-10-07 DIAGNOSIS — E785 Hyperlipidemia, unspecified: Secondary | ICD-10-CM | POA: Diagnosis not present

## 2023-10-07 DIAGNOSIS — Z7901 Long term (current) use of anticoagulants: Secondary | ICD-10-CM | POA: Diagnosis not present

## 2023-10-08 DIAGNOSIS — W19XXXA Unspecified fall, initial encounter: Secondary | ICD-10-CM | POA: Diagnosis not present

## 2023-10-08 DIAGNOSIS — I4891 Unspecified atrial fibrillation: Secondary | ICD-10-CM | POA: Diagnosis not present

## 2023-10-08 DIAGNOSIS — R609 Edema, unspecified: Secondary | ICD-10-CM | POA: Diagnosis not present

## 2023-10-12 DIAGNOSIS — Z8673 Personal history of transient ischemic attack (TIA), and cerebral infarction without residual deficits: Secondary | ICD-10-CM | POA: Diagnosis not present

## 2023-10-12 DIAGNOSIS — G4733 Obstructive sleep apnea (adult) (pediatric): Secondary | ICD-10-CM | POA: Diagnosis not present

## 2023-10-12 DIAGNOSIS — D509 Iron deficiency anemia, unspecified: Secondary | ICD-10-CM | POA: Diagnosis not present

## 2023-10-12 DIAGNOSIS — I110 Hypertensive heart disease with heart failure: Secondary | ICD-10-CM | POA: Diagnosis not present

## 2023-10-12 DIAGNOSIS — J302 Other seasonal allergic rhinitis: Secondary | ICD-10-CM | POA: Diagnosis not present

## 2023-10-12 DIAGNOSIS — I4819 Other persistent atrial fibrillation: Secondary | ICD-10-CM | POA: Diagnosis not present

## 2023-10-12 DIAGNOSIS — Z7951 Long term (current) use of inhaled steroids: Secondary | ICD-10-CM | POA: Diagnosis not present

## 2023-10-12 DIAGNOSIS — I5033 Acute on chronic diastolic (congestive) heart failure: Secondary | ICD-10-CM | POA: Diagnosis not present

## 2023-10-12 DIAGNOSIS — Z951 Presence of aortocoronary bypass graft: Secondary | ICD-10-CM | POA: Diagnosis not present

## 2023-10-12 DIAGNOSIS — I2510 Atherosclerotic heart disease of native coronary artery without angina pectoris: Secondary | ICD-10-CM | POA: Diagnosis not present

## 2023-10-12 DIAGNOSIS — G4700 Insomnia, unspecified: Secondary | ICD-10-CM | POA: Diagnosis not present

## 2023-10-12 DIAGNOSIS — Z7901 Long term (current) use of anticoagulants: Secondary | ICD-10-CM | POA: Diagnosis not present

## 2023-10-12 DIAGNOSIS — Z87891 Personal history of nicotine dependence: Secondary | ICD-10-CM | POA: Diagnosis not present

## 2023-10-12 DIAGNOSIS — Z955 Presence of coronary angioplasty implant and graft: Secondary | ICD-10-CM | POA: Diagnosis not present

## 2023-10-12 DIAGNOSIS — M109 Gout, unspecified: Secondary | ICD-10-CM | POA: Diagnosis not present

## 2023-10-12 DIAGNOSIS — I252 Old myocardial infarction: Secondary | ICD-10-CM | POA: Diagnosis not present

## 2023-10-12 DIAGNOSIS — I779 Disorder of arteries and arterioles, unspecified: Secondary | ICD-10-CM | POA: Diagnosis not present

## 2023-10-12 DIAGNOSIS — M1990 Unspecified osteoarthritis, unspecified site: Secondary | ICD-10-CM | POA: Diagnosis not present

## 2023-10-12 DIAGNOSIS — S42302D Unspecified fracture of shaft of humerus, left arm, subsequent encounter for fracture with routine healing: Secondary | ICD-10-CM | POA: Diagnosis not present

## 2023-10-12 DIAGNOSIS — I493 Ventricular premature depolarization: Secondary | ICD-10-CM | POA: Diagnosis not present

## 2023-10-12 DIAGNOSIS — E785 Hyperlipidemia, unspecified: Secondary | ICD-10-CM | POA: Diagnosis not present

## 2023-10-13 NOTE — Telephone Encounter (Signed)
 Order placed

## 2023-10-14 DIAGNOSIS — Z8673 Personal history of transient ischemic attack (TIA), and cerebral infarction without residual deficits: Secondary | ICD-10-CM | POA: Diagnosis not present

## 2023-10-14 DIAGNOSIS — Z951 Presence of aortocoronary bypass graft: Secondary | ICD-10-CM | POA: Diagnosis not present

## 2023-10-14 DIAGNOSIS — S42302D Unspecified fracture of shaft of humerus, left arm, subsequent encounter for fracture with routine healing: Secondary | ICD-10-CM | POA: Diagnosis not present

## 2023-10-14 DIAGNOSIS — M109 Gout, unspecified: Secondary | ICD-10-CM | POA: Diagnosis not present

## 2023-10-14 DIAGNOSIS — M1990 Unspecified osteoarthritis, unspecified site: Secondary | ICD-10-CM | POA: Diagnosis not present

## 2023-10-14 DIAGNOSIS — J302 Other seasonal allergic rhinitis: Secondary | ICD-10-CM | POA: Diagnosis not present

## 2023-10-14 DIAGNOSIS — G4700 Insomnia, unspecified: Secondary | ICD-10-CM | POA: Diagnosis not present

## 2023-10-14 DIAGNOSIS — Z7901 Long term (current) use of anticoagulants: Secondary | ICD-10-CM | POA: Diagnosis not present

## 2023-10-14 DIAGNOSIS — I493 Ventricular premature depolarization: Secondary | ICD-10-CM | POA: Diagnosis not present

## 2023-10-14 DIAGNOSIS — E785 Hyperlipidemia, unspecified: Secondary | ICD-10-CM | POA: Diagnosis not present

## 2023-10-14 DIAGNOSIS — I5033 Acute on chronic diastolic (congestive) heart failure: Secondary | ICD-10-CM | POA: Diagnosis not present

## 2023-10-14 DIAGNOSIS — I4819 Other persistent atrial fibrillation: Secondary | ICD-10-CM | POA: Diagnosis not present

## 2023-10-14 DIAGNOSIS — Z87891 Personal history of nicotine dependence: Secondary | ICD-10-CM | POA: Diagnosis not present

## 2023-10-14 DIAGNOSIS — I779 Disorder of arteries and arterioles, unspecified: Secondary | ICD-10-CM | POA: Diagnosis not present

## 2023-10-14 DIAGNOSIS — Z955 Presence of coronary angioplasty implant and graft: Secondary | ICD-10-CM | POA: Diagnosis not present

## 2023-10-14 DIAGNOSIS — I252 Old myocardial infarction: Secondary | ICD-10-CM | POA: Diagnosis not present

## 2023-10-14 DIAGNOSIS — G4733 Obstructive sleep apnea (adult) (pediatric): Secondary | ICD-10-CM | POA: Diagnosis not present

## 2023-10-14 DIAGNOSIS — I2510 Atherosclerotic heart disease of native coronary artery without angina pectoris: Secondary | ICD-10-CM | POA: Diagnosis not present

## 2023-10-14 DIAGNOSIS — I110 Hypertensive heart disease with heart failure: Secondary | ICD-10-CM | POA: Diagnosis not present

## 2023-10-14 DIAGNOSIS — D509 Iron deficiency anemia, unspecified: Secondary | ICD-10-CM | POA: Diagnosis not present

## 2023-10-14 DIAGNOSIS — Z7951 Long term (current) use of inhaled steroids: Secondary | ICD-10-CM | POA: Diagnosis not present

## 2023-10-18 DIAGNOSIS — I493 Ventricular premature depolarization: Secondary | ICD-10-CM | POA: Diagnosis not present

## 2023-10-18 DIAGNOSIS — S42302D Unspecified fracture of shaft of humerus, left arm, subsequent encounter for fracture with routine healing: Secondary | ICD-10-CM | POA: Diagnosis not present

## 2023-10-18 DIAGNOSIS — Z955 Presence of coronary angioplasty implant and graft: Secondary | ICD-10-CM | POA: Diagnosis not present

## 2023-10-18 DIAGNOSIS — I110 Hypertensive heart disease with heart failure: Secondary | ICD-10-CM | POA: Diagnosis not present

## 2023-10-18 DIAGNOSIS — I2510 Atherosclerotic heart disease of native coronary artery without angina pectoris: Secondary | ICD-10-CM | POA: Diagnosis not present

## 2023-10-18 DIAGNOSIS — Z7901 Long term (current) use of anticoagulants: Secondary | ICD-10-CM | POA: Diagnosis not present

## 2023-10-18 DIAGNOSIS — Z7951 Long term (current) use of inhaled steroids: Secondary | ICD-10-CM | POA: Diagnosis not present

## 2023-10-18 DIAGNOSIS — Z951 Presence of aortocoronary bypass graft: Secondary | ICD-10-CM | POA: Diagnosis not present

## 2023-10-18 DIAGNOSIS — D509 Iron deficiency anemia, unspecified: Secondary | ICD-10-CM | POA: Diagnosis not present

## 2023-10-18 DIAGNOSIS — I5033 Acute on chronic diastolic (congestive) heart failure: Secondary | ICD-10-CM | POA: Diagnosis not present

## 2023-10-18 DIAGNOSIS — E785 Hyperlipidemia, unspecified: Secondary | ICD-10-CM | POA: Diagnosis not present

## 2023-10-18 DIAGNOSIS — Z87891 Personal history of nicotine dependence: Secondary | ICD-10-CM | POA: Diagnosis not present

## 2023-10-18 DIAGNOSIS — J302 Other seasonal allergic rhinitis: Secondary | ICD-10-CM | POA: Diagnosis not present

## 2023-10-18 DIAGNOSIS — I779 Disorder of arteries and arterioles, unspecified: Secondary | ICD-10-CM | POA: Diagnosis not present

## 2023-10-18 DIAGNOSIS — Z8673 Personal history of transient ischemic attack (TIA), and cerebral infarction without residual deficits: Secondary | ICD-10-CM | POA: Diagnosis not present

## 2023-10-18 DIAGNOSIS — M109 Gout, unspecified: Secondary | ICD-10-CM | POA: Diagnosis not present

## 2023-10-18 DIAGNOSIS — M1990 Unspecified osteoarthritis, unspecified site: Secondary | ICD-10-CM | POA: Diagnosis not present

## 2023-10-18 DIAGNOSIS — G4733 Obstructive sleep apnea (adult) (pediatric): Secondary | ICD-10-CM | POA: Diagnosis not present

## 2023-10-18 DIAGNOSIS — I252 Old myocardial infarction: Secondary | ICD-10-CM | POA: Diagnosis not present

## 2023-10-18 DIAGNOSIS — G4700 Insomnia, unspecified: Secondary | ICD-10-CM | POA: Diagnosis not present

## 2023-10-18 DIAGNOSIS — I4819 Other persistent atrial fibrillation: Secondary | ICD-10-CM | POA: Diagnosis not present

## 2023-10-19 DIAGNOSIS — I110 Hypertensive heart disease with heart failure: Secondary | ICD-10-CM | POA: Diagnosis not present

## 2023-10-19 DIAGNOSIS — I2510 Atherosclerotic heart disease of native coronary artery without angina pectoris: Secondary | ICD-10-CM | POA: Diagnosis not present

## 2023-10-19 DIAGNOSIS — M109 Gout, unspecified: Secondary | ICD-10-CM | POA: Diagnosis not present

## 2023-10-19 DIAGNOSIS — E785 Hyperlipidemia, unspecified: Secondary | ICD-10-CM | POA: Diagnosis not present

## 2023-10-19 DIAGNOSIS — G4700 Insomnia, unspecified: Secondary | ICD-10-CM | POA: Diagnosis not present

## 2023-10-19 DIAGNOSIS — I493 Ventricular premature depolarization: Secondary | ICD-10-CM | POA: Diagnosis not present

## 2023-10-19 DIAGNOSIS — S42302D Unspecified fracture of shaft of humerus, left arm, subsequent encounter for fracture with routine healing: Secondary | ICD-10-CM | POA: Diagnosis not present

## 2023-10-19 DIAGNOSIS — G4733 Obstructive sleep apnea (adult) (pediatric): Secondary | ICD-10-CM | POA: Diagnosis not present

## 2023-10-19 DIAGNOSIS — Z7951 Long term (current) use of inhaled steroids: Secondary | ICD-10-CM | POA: Diagnosis not present

## 2023-10-19 DIAGNOSIS — Z951 Presence of aortocoronary bypass graft: Secondary | ICD-10-CM | POA: Diagnosis not present

## 2023-10-19 DIAGNOSIS — Z8673 Personal history of transient ischemic attack (TIA), and cerebral infarction without residual deficits: Secondary | ICD-10-CM | POA: Diagnosis not present

## 2023-10-19 DIAGNOSIS — Z87891 Personal history of nicotine dependence: Secondary | ICD-10-CM | POA: Diagnosis not present

## 2023-10-19 DIAGNOSIS — I779 Disorder of arteries and arterioles, unspecified: Secondary | ICD-10-CM | POA: Diagnosis not present

## 2023-10-19 DIAGNOSIS — M1990 Unspecified osteoarthritis, unspecified site: Secondary | ICD-10-CM | POA: Diagnosis not present

## 2023-10-19 DIAGNOSIS — I4819 Other persistent atrial fibrillation: Secondary | ICD-10-CM | POA: Diagnosis not present

## 2023-10-19 DIAGNOSIS — I252 Old myocardial infarction: Secondary | ICD-10-CM | POA: Diagnosis not present

## 2023-10-19 DIAGNOSIS — J302 Other seasonal allergic rhinitis: Secondary | ICD-10-CM | POA: Diagnosis not present

## 2023-10-19 DIAGNOSIS — D509 Iron deficiency anemia, unspecified: Secondary | ICD-10-CM | POA: Diagnosis not present

## 2023-10-19 DIAGNOSIS — I5033 Acute on chronic diastolic (congestive) heart failure: Secondary | ICD-10-CM | POA: Diagnosis not present

## 2023-10-19 DIAGNOSIS — Z955 Presence of coronary angioplasty implant and graft: Secondary | ICD-10-CM | POA: Diagnosis not present

## 2023-10-19 DIAGNOSIS — Z7901 Long term (current) use of anticoagulants: Secondary | ICD-10-CM | POA: Diagnosis not present

## 2023-10-21 DIAGNOSIS — I779 Disorder of arteries and arterioles, unspecified: Secondary | ICD-10-CM | POA: Diagnosis not present

## 2023-10-21 DIAGNOSIS — I252 Old myocardial infarction: Secondary | ICD-10-CM | POA: Diagnosis not present

## 2023-10-21 DIAGNOSIS — M109 Gout, unspecified: Secondary | ICD-10-CM | POA: Diagnosis not present

## 2023-10-21 DIAGNOSIS — M1990 Unspecified osteoarthritis, unspecified site: Secondary | ICD-10-CM | POA: Diagnosis not present

## 2023-10-21 DIAGNOSIS — Z87891 Personal history of nicotine dependence: Secondary | ICD-10-CM | POA: Diagnosis not present

## 2023-10-21 DIAGNOSIS — Z7951 Long term (current) use of inhaled steroids: Secondary | ICD-10-CM | POA: Diagnosis not present

## 2023-10-21 DIAGNOSIS — Z955 Presence of coronary angioplasty implant and graft: Secondary | ICD-10-CM | POA: Diagnosis not present

## 2023-10-21 DIAGNOSIS — I110 Hypertensive heart disease with heart failure: Secondary | ICD-10-CM | POA: Diagnosis not present

## 2023-10-21 DIAGNOSIS — I4819 Other persistent atrial fibrillation: Secondary | ICD-10-CM | POA: Diagnosis not present

## 2023-10-21 DIAGNOSIS — D509 Iron deficiency anemia, unspecified: Secondary | ICD-10-CM | POA: Diagnosis not present

## 2023-10-21 DIAGNOSIS — E785 Hyperlipidemia, unspecified: Secondary | ICD-10-CM | POA: Diagnosis not present

## 2023-10-21 DIAGNOSIS — J302 Other seasonal allergic rhinitis: Secondary | ICD-10-CM | POA: Diagnosis not present

## 2023-10-21 DIAGNOSIS — I5033 Acute on chronic diastolic (congestive) heart failure: Secondary | ICD-10-CM | POA: Diagnosis not present

## 2023-10-21 DIAGNOSIS — Z8673 Personal history of transient ischemic attack (TIA), and cerebral infarction without residual deficits: Secondary | ICD-10-CM | POA: Diagnosis not present

## 2023-10-21 DIAGNOSIS — I493 Ventricular premature depolarization: Secondary | ICD-10-CM | POA: Diagnosis not present

## 2023-10-21 DIAGNOSIS — I2510 Atherosclerotic heart disease of native coronary artery without angina pectoris: Secondary | ICD-10-CM | POA: Diagnosis not present

## 2023-10-21 DIAGNOSIS — G4733 Obstructive sleep apnea (adult) (pediatric): Secondary | ICD-10-CM | POA: Diagnosis not present

## 2023-10-21 DIAGNOSIS — S42302D Unspecified fracture of shaft of humerus, left arm, subsequent encounter for fracture with routine healing: Secondary | ICD-10-CM | POA: Diagnosis not present

## 2023-10-21 DIAGNOSIS — Z7901 Long term (current) use of anticoagulants: Secondary | ICD-10-CM | POA: Diagnosis not present

## 2023-10-21 DIAGNOSIS — G4700 Insomnia, unspecified: Secondary | ICD-10-CM | POA: Diagnosis not present

## 2023-10-21 DIAGNOSIS — Z951 Presence of aortocoronary bypass graft: Secondary | ICD-10-CM | POA: Diagnosis not present

## 2023-10-22 DIAGNOSIS — E782 Mixed hyperlipidemia: Secondary | ICD-10-CM | POA: Diagnosis not present

## 2023-10-22 DIAGNOSIS — I110 Hypertensive heart disease with heart failure: Secondary | ICD-10-CM | POA: Diagnosis not present

## 2023-10-22 DIAGNOSIS — G4733 Obstructive sleep apnea (adult) (pediatric): Secondary | ICD-10-CM | POA: Diagnosis not present

## 2023-10-25 ENCOUNTER — Telehealth (HOSPITAL_BASED_OUTPATIENT_CLINIC_OR_DEPARTMENT_OTHER): Payer: Self-pay

## 2023-10-25 ENCOUNTER — Encounter (HOSPITAL_BASED_OUTPATIENT_CLINIC_OR_DEPARTMENT_OTHER): Payer: Self-pay

## 2023-10-25 DIAGNOSIS — M79672 Pain in left foot: Secondary | ICD-10-CM | POA: Diagnosis not present

## 2023-10-25 DIAGNOSIS — R7303 Prediabetes: Secondary | ICD-10-CM | POA: Insufficient documentation

## 2023-10-25 DIAGNOSIS — I2510 Atherosclerotic heart disease of native coronary artery without angina pectoris: Secondary | ICD-10-CM | POA: Diagnosis not present

## 2023-10-25 DIAGNOSIS — S4292XD Fracture of left shoulder girdle, part unspecified, subsequent encounter for fracture with routine healing: Secondary | ICD-10-CM | POA: Diagnosis not present

## 2023-10-25 DIAGNOSIS — M1990 Unspecified osteoarthritis, unspecified site: Secondary | ICD-10-CM | POA: Diagnosis not present

## 2023-10-25 DIAGNOSIS — E782 Mixed hyperlipidemia: Secondary | ICD-10-CM | POA: Diagnosis not present

## 2023-10-25 DIAGNOSIS — I110 Hypertensive heart disease with heart failure: Secondary | ICD-10-CM | POA: Diagnosis not present

## 2023-10-25 DIAGNOSIS — K219 Gastro-esophageal reflux disease without esophagitis: Secondary | ICD-10-CM | POA: Diagnosis not present

## 2023-10-25 DIAGNOSIS — M5451 Vertebrogenic low back pain: Secondary | ICD-10-CM | POA: Diagnosis not present

## 2023-10-25 DIAGNOSIS — M25579 Pain in unspecified ankle and joints of unspecified foot: Secondary | ICD-10-CM | POA: Diagnosis not present

## 2023-10-25 DIAGNOSIS — I4891 Unspecified atrial fibrillation: Secondary | ICD-10-CM | POA: Diagnosis not present

## 2023-10-25 DIAGNOSIS — J302 Other seasonal allergic rhinitis: Secondary | ICD-10-CM | POA: Diagnosis not present

## 2023-10-25 DIAGNOSIS — D509 Iron deficiency anemia, unspecified: Secondary | ICD-10-CM | POA: Diagnosis not present

## 2023-10-25 DIAGNOSIS — M109 Gout, unspecified: Secondary | ICD-10-CM | POA: Diagnosis not present

## 2023-10-25 NOTE — Telephone Encounter (Signed)
Can you please check on this for me?

## 2023-10-25 NOTE — Telephone Encounter (Signed)
 Per Rodman Clam at [facility] they have a signed order they are waiting on Pre Authorization from insurance to call pt to schedule delivery of his CPAP. All is good to go with order on their end. Spoke with pt to notify him     Copied from CRM [id number]. Topic: Clinical - Order For Equipment >> Oct 22, 2023  2:24 PM Hilton Lucky wrote: Reason for CRM: Elana Grayer with Dr. Quentin Brunner office is calling to state that [facility] returned the order for this patient's CPAP because it was not signed and dated by Dr. Villa Greaser. Requesting to have it faxed back ASAP, as patient is unable to sleep without his CPAP.   States they have not checked with the insurance for eligibility but will do so when signed and dated order is received.   Elana Grayer can be reached with PCP's office at [phone]. For who he spoke to [phone] and spoke to [facility] in the sleep department.

## 2023-10-26 ENCOUNTER — Ambulatory Visit: Admitting: Nurse Practitioner

## 2023-10-26 DIAGNOSIS — I779 Disorder of arteries and arterioles, unspecified: Secondary | ICD-10-CM | POA: Diagnosis not present

## 2023-10-26 DIAGNOSIS — S42302D Unspecified fracture of shaft of humerus, left arm, subsequent encounter for fracture with routine healing: Secondary | ICD-10-CM | POA: Diagnosis not present

## 2023-10-26 DIAGNOSIS — Z955 Presence of coronary angioplasty implant and graft: Secondary | ICD-10-CM | POA: Diagnosis not present

## 2023-10-26 DIAGNOSIS — I2510 Atherosclerotic heart disease of native coronary artery without angina pectoris: Secondary | ICD-10-CM | POA: Diagnosis not present

## 2023-10-26 DIAGNOSIS — Z7901 Long term (current) use of anticoagulants: Secondary | ICD-10-CM | POA: Diagnosis not present

## 2023-10-26 DIAGNOSIS — J302 Other seasonal allergic rhinitis: Secondary | ICD-10-CM | POA: Diagnosis not present

## 2023-10-26 DIAGNOSIS — D509 Iron deficiency anemia, unspecified: Secondary | ICD-10-CM | POA: Diagnosis not present

## 2023-10-26 DIAGNOSIS — Z7951 Long term (current) use of inhaled steroids: Secondary | ICD-10-CM | POA: Diagnosis not present

## 2023-10-26 DIAGNOSIS — I5033 Acute on chronic diastolic (congestive) heart failure: Secondary | ICD-10-CM | POA: Diagnosis not present

## 2023-10-26 DIAGNOSIS — I4819 Other persistent atrial fibrillation: Secondary | ICD-10-CM | POA: Diagnosis not present

## 2023-10-26 DIAGNOSIS — I252 Old myocardial infarction: Secondary | ICD-10-CM | POA: Diagnosis not present

## 2023-10-26 DIAGNOSIS — I110 Hypertensive heart disease with heart failure: Secondary | ICD-10-CM | POA: Diagnosis not present

## 2023-10-26 DIAGNOSIS — M109 Gout, unspecified: Secondary | ICD-10-CM | POA: Diagnosis not present

## 2023-10-26 DIAGNOSIS — Z951 Presence of aortocoronary bypass graft: Secondary | ICD-10-CM | POA: Diagnosis not present

## 2023-10-26 DIAGNOSIS — Z8673 Personal history of transient ischemic attack (TIA), and cerebral infarction without residual deficits: Secondary | ICD-10-CM | POA: Diagnosis not present

## 2023-10-26 DIAGNOSIS — E785 Hyperlipidemia, unspecified: Secondary | ICD-10-CM | POA: Diagnosis not present

## 2023-10-26 DIAGNOSIS — I493 Ventricular premature depolarization: Secondary | ICD-10-CM | POA: Diagnosis not present

## 2023-10-26 DIAGNOSIS — Z87891 Personal history of nicotine dependence: Secondary | ICD-10-CM | POA: Diagnosis not present

## 2023-10-26 DIAGNOSIS — G4733 Obstructive sleep apnea (adult) (pediatric): Secondary | ICD-10-CM | POA: Diagnosis not present

## 2023-10-26 DIAGNOSIS — G4700 Insomnia, unspecified: Secondary | ICD-10-CM | POA: Diagnosis not present

## 2023-10-26 DIAGNOSIS — M1990 Unspecified osteoarthritis, unspecified site: Secondary | ICD-10-CM | POA: Diagnosis not present

## 2023-10-27 DIAGNOSIS — I110 Hypertensive heart disease with heart failure: Secondary | ICD-10-CM | POA: Diagnosis not present

## 2023-10-27 DIAGNOSIS — Z955 Presence of coronary angioplasty implant and graft: Secondary | ICD-10-CM | POA: Diagnosis not present

## 2023-10-27 DIAGNOSIS — Z7901 Long term (current) use of anticoagulants: Secondary | ICD-10-CM | POA: Diagnosis not present

## 2023-10-27 DIAGNOSIS — G4700 Insomnia, unspecified: Secondary | ICD-10-CM | POA: Diagnosis not present

## 2023-10-27 DIAGNOSIS — Z7951 Long term (current) use of inhaled steroids: Secondary | ICD-10-CM | POA: Diagnosis not present

## 2023-10-27 DIAGNOSIS — I2510 Atherosclerotic heart disease of native coronary artery without angina pectoris: Secondary | ICD-10-CM | POA: Diagnosis not present

## 2023-10-27 DIAGNOSIS — I4819 Other persistent atrial fibrillation: Secondary | ICD-10-CM | POA: Diagnosis not present

## 2023-10-27 DIAGNOSIS — S42302D Unspecified fracture of shaft of humerus, left arm, subsequent encounter for fracture with routine healing: Secondary | ICD-10-CM | POA: Diagnosis not present

## 2023-10-27 DIAGNOSIS — E785 Hyperlipidemia, unspecified: Secondary | ICD-10-CM | POA: Diagnosis not present

## 2023-10-27 DIAGNOSIS — M109 Gout, unspecified: Secondary | ICD-10-CM | POA: Diagnosis not present

## 2023-10-27 DIAGNOSIS — Z8673 Personal history of transient ischemic attack (TIA), and cerebral infarction without residual deficits: Secondary | ICD-10-CM | POA: Diagnosis not present

## 2023-10-27 DIAGNOSIS — G4733 Obstructive sleep apnea (adult) (pediatric): Secondary | ICD-10-CM | POA: Diagnosis not present

## 2023-10-27 DIAGNOSIS — Z951 Presence of aortocoronary bypass graft: Secondary | ICD-10-CM | POA: Diagnosis not present

## 2023-10-27 DIAGNOSIS — I252 Old myocardial infarction: Secondary | ICD-10-CM | POA: Diagnosis not present

## 2023-10-27 DIAGNOSIS — I779 Disorder of arteries and arterioles, unspecified: Secondary | ICD-10-CM | POA: Diagnosis not present

## 2023-10-27 DIAGNOSIS — Z87891 Personal history of nicotine dependence: Secondary | ICD-10-CM | POA: Diagnosis not present

## 2023-10-27 DIAGNOSIS — D509 Iron deficiency anemia, unspecified: Secondary | ICD-10-CM | POA: Diagnosis not present

## 2023-10-27 DIAGNOSIS — I5033 Acute on chronic diastolic (congestive) heart failure: Secondary | ICD-10-CM | POA: Diagnosis not present

## 2023-10-27 DIAGNOSIS — J302 Other seasonal allergic rhinitis: Secondary | ICD-10-CM | POA: Diagnosis not present

## 2023-10-27 DIAGNOSIS — M1990 Unspecified osteoarthritis, unspecified site: Secondary | ICD-10-CM | POA: Diagnosis not present

## 2023-10-27 DIAGNOSIS — I493 Ventricular premature depolarization: Secondary | ICD-10-CM | POA: Diagnosis not present

## 2023-10-28 DIAGNOSIS — I4819 Other persistent atrial fibrillation: Secondary | ICD-10-CM | POA: Diagnosis not present

## 2023-10-28 DIAGNOSIS — I779 Disorder of arteries and arterioles, unspecified: Secondary | ICD-10-CM | POA: Diagnosis not present

## 2023-10-28 DIAGNOSIS — E785 Hyperlipidemia, unspecified: Secondary | ICD-10-CM | POA: Diagnosis not present

## 2023-10-28 DIAGNOSIS — D509 Iron deficiency anemia, unspecified: Secondary | ICD-10-CM | POA: Diagnosis not present

## 2023-10-28 DIAGNOSIS — I110 Hypertensive heart disease with heart failure: Secondary | ICD-10-CM | POA: Diagnosis not present

## 2023-10-28 DIAGNOSIS — Z8673 Personal history of transient ischemic attack (TIA), and cerebral infarction without residual deficits: Secondary | ICD-10-CM | POA: Diagnosis not present

## 2023-10-28 DIAGNOSIS — Z7951 Long term (current) use of inhaled steroids: Secondary | ICD-10-CM | POA: Diagnosis not present

## 2023-10-28 DIAGNOSIS — I5033 Acute on chronic diastolic (congestive) heart failure: Secondary | ICD-10-CM | POA: Diagnosis not present

## 2023-10-28 DIAGNOSIS — Z87891 Personal history of nicotine dependence: Secondary | ICD-10-CM | POA: Diagnosis not present

## 2023-10-28 DIAGNOSIS — I493 Ventricular premature depolarization: Secondary | ICD-10-CM | POA: Diagnosis not present

## 2023-10-28 DIAGNOSIS — I2510 Atherosclerotic heart disease of native coronary artery without angina pectoris: Secondary | ICD-10-CM | POA: Diagnosis not present

## 2023-10-28 DIAGNOSIS — Z955 Presence of coronary angioplasty implant and graft: Secondary | ICD-10-CM | POA: Diagnosis not present

## 2023-10-28 DIAGNOSIS — G4733 Obstructive sleep apnea (adult) (pediatric): Secondary | ICD-10-CM | POA: Diagnosis not present

## 2023-10-28 DIAGNOSIS — G4700 Insomnia, unspecified: Secondary | ICD-10-CM | POA: Diagnosis not present

## 2023-10-28 DIAGNOSIS — S42302D Unspecified fracture of shaft of humerus, left arm, subsequent encounter for fracture with routine healing: Secondary | ICD-10-CM | POA: Diagnosis not present

## 2023-10-28 DIAGNOSIS — M1990 Unspecified osteoarthritis, unspecified site: Secondary | ICD-10-CM | POA: Diagnosis not present

## 2023-10-28 DIAGNOSIS — Z951 Presence of aortocoronary bypass graft: Secondary | ICD-10-CM | POA: Diagnosis not present

## 2023-10-28 DIAGNOSIS — Z7901 Long term (current) use of anticoagulants: Secondary | ICD-10-CM | POA: Diagnosis not present

## 2023-10-28 DIAGNOSIS — M109 Gout, unspecified: Secondary | ICD-10-CM | POA: Diagnosis not present

## 2023-10-28 DIAGNOSIS — I252 Old myocardial infarction: Secondary | ICD-10-CM | POA: Diagnosis not present

## 2023-10-28 DIAGNOSIS — J302 Other seasonal allergic rhinitis: Secondary | ICD-10-CM | POA: Diagnosis not present

## 2023-10-29 ENCOUNTER — Ambulatory Visit: Admitting: Urology

## 2023-11-03 DIAGNOSIS — G4700 Insomnia, unspecified: Secondary | ICD-10-CM | POA: Diagnosis not present

## 2023-11-03 DIAGNOSIS — I110 Hypertensive heart disease with heart failure: Secondary | ICD-10-CM | POA: Diagnosis not present

## 2023-11-03 DIAGNOSIS — N400 Enlarged prostate without lower urinary tract symptoms: Secondary | ICD-10-CM | POA: Insufficient documentation

## 2023-11-03 DIAGNOSIS — F320 Major depressive disorder, single episode, mild: Secondary | ICD-10-CM | POA: Insufficient documentation

## 2023-11-03 DIAGNOSIS — M1990 Unspecified osteoarthritis, unspecified site: Secondary | ICD-10-CM | POA: Diagnosis not present

## 2023-11-03 DIAGNOSIS — I2510 Atherosclerotic heart disease of native coronary artery without angina pectoris: Secondary | ICD-10-CM | POA: Diagnosis not present

## 2023-11-03 DIAGNOSIS — I5033 Acute on chronic diastolic (congestive) heart failure: Secondary | ICD-10-CM | POA: Diagnosis not present

## 2023-11-03 DIAGNOSIS — I4819 Other persistent atrial fibrillation: Secondary | ICD-10-CM | POA: Diagnosis not present

## 2023-11-03 DIAGNOSIS — R0600 Dyspnea, unspecified: Secondary | ICD-10-CM | POA: Insufficient documentation

## 2023-11-03 DIAGNOSIS — Z955 Presence of coronary angioplasty implant and graft: Secondary | ICD-10-CM | POA: Diagnosis not present

## 2023-11-03 DIAGNOSIS — S42302D Unspecified fracture of shaft of humerus, left arm, subsequent encounter for fracture with routine healing: Secondary | ICD-10-CM | POA: Diagnosis not present

## 2023-11-03 DIAGNOSIS — E782 Mixed hyperlipidemia: Secondary | ICD-10-CM | POA: Insufficient documentation

## 2023-11-03 DIAGNOSIS — Z8673 Personal history of transient ischemic attack (TIA), and cerebral infarction without residual deficits: Secondary | ICD-10-CM | POA: Diagnosis not present

## 2023-11-03 DIAGNOSIS — G4733 Obstructive sleep apnea (adult) (pediatric): Secondary | ICD-10-CM | POA: Diagnosis not present

## 2023-11-03 DIAGNOSIS — R7301 Impaired fasting glucose: Secondary | ICD-10-CM | POA: Insufficient documentation

## 2023-11-03 DIAGNOSIS — I350 Nonrheumatic aortic (valve) stenosis: Secondary | ICD-10-CM | POA: Insufficient documentation

## 2023-11-03 DIAGNOSIS — Z951 Presence of aortocoronary bypass graft: Secondary | ICD-10-CM | POA: Diagnosis not present

## 2023-11-03 DIAGNOSIS — M109 Gout, unspecified: Secondary | ICD-10-CM | POA: Diagnosis not present

## 2023-11-03 DIAGNOSIS — Z87891 Personal history of nicotine dependence: Secondary | ICD-10-CM | POA: Diagnosis not present

## 2023-11-03 DIAGNOSIS — S4292XD Fracture of left shoulder girdle, part unspecified, subsequent encounter for fracture with routine healing: Secondary | ICD-10-CM | POA: Insufficient documentation

## 2023-11-03 DIAGNOSIS — J302 Other seasonal allergic rhinitis: Secondary | ICD-10-CM | POA: Diagnosis not present

## 2023-11-03 DIAGNOSIS — D509 Iron deficiency anemia, unspecified: Secondary | ICD-10-CM | POA: Diagnosis not present

## 2023-11-03 DIAGNOSIS — D6949 Other primary thrombocytopenia: Secondary | ICD-10-CM | POA: Insufficient documentation

## 2023-11-03 DIAGNOSIS — I493 Ventricular premature depolarization: Secondary | ICD-10-CM | POA: Diagnosis not present

## 2023-11-03 DIAGNOSIS — K922 Gastrointestinal hemorrhage, unspecified: Secondary | ICD-10-CM | POA: Insufficient documentation

## 2023-11-03 DIAGNOSIS — I779 Disorder of arteries and arterioles, unspecified: Secondary | ICD-10-CM | POA: Diagnosis not present

## 2023-11-03 DIAGNOSIS — Z7901 Long term (current) use of anticoagulants: Secondary | ICD-10-CM | POA: Diagnosis not present

## 2023-11-03 DIAGNOSIS — I6529 Occlusion and stenosis of unspecified carotid artery: Secondary | ICD-10-CM | POA: Insufficient documentation

## 2023-11-03 DIAGNOSIS — K219 Gastro-esophageal reflux disease without esophagitis: Secondary | ICD-10-CM | POA: Insufficient documentation

## 2023-11-03 DIAGNOSIS — M5451 Vertebrogenic low back pain: Secondary | ICD-10-CM | POA: Insufficient documentation

## 2023-11-03 DIAGNOSIS — F32A Depression, unspecified: Secondary | ICD-10-CM | POA: Insufficient documentation

## 2023-11-03 DIAGNOSIS — Z7951 Long term (current) use of inhaled steroids: Secondary | ICD-10-CM | POA: Diagnosis not present

## 2023-11-03 DIAGNOSIS — I252 Old myocardial infarction: Secondary | ICD-10-CM | POA: Diagnosis not present

## 2023-11-03 DIAGNOSIS — I872 Venous insufficiency (chronic) (peripheral): Secondary | ICD-10-CM | POA: Insufficient documentation

## 2023-11-03 DIAGNOSIS — E785 Hyperlipidemia, unspecified: Secondary | ICD-10-CM | POA: Diagnosis not present

## 2023-11-04 ENCOUNTER — Encounter: Admitting: Orthopedic Surgery

## 2023-11-04 ENCOUNTER — Telehealth: Payer: Self-pay | Admitting: Orthopedic Surgery

## 2023-11-04 ENCOUNTER — Other Ambulatory Visit: Payer: Self-pay | Admitting: Cardiology

## 2023-11-04 DIAGNOSIS — Z7951 Long term (current) use of inhaled steroids: Secondary | ICD-10-CM | POA: Diagnosis not present

## 2023-11-04 DIAGNOSIS — J302 Other seasonal allergic rhinitis: Secondary | ICD-10-CM | POA: Diagnosis not present

## 2023-11-04 DIAGNOSIS — G4700 Insomnia, unspecified: Secondary | ICD-10-CM | POA: Diagnosis not present

## 2023-11-04 DIAGNOSIS — S4292XD Fracture of left shoulder girdle, part unspecified, subsequent encounter for fracture with routine healing: Secondary | ICD-10-CM

## 2023-11-04 DIAGNOSIS — Z951 Presence of aortocoronary bypass graft: Secondary | ICD-10-CM | POA: Diagnosis not present

## 2023-11-04 DIAGNOSIS — I4819 Other persistent atrial fibrillation: Secondary | ICD-10-CM | POA: Diagnosis not present

## 2023-11-04 DIAGNOSIS — I252 Old myocardial infarction: Secondary | ICD-10-CM | POA: Diagnosis not present

## 2023-11-04 DIAGNOSIS — Z8673 Personal history of transient ischemic attack (TIA), and cerebral infarction without residual deficits: Secondary | ICD-10-CM | POA: Diagnosis not present

## 2023-11-04 DIAGNOSIS — Z955 Presence of coronary angioplasty implant and graft: Secondary | ICD-10-CM | POA: Diagnosis not present

## 2023-11-04 DIAGNOSIS — G4733 Obstructive sleep apnea (adult) (pediatric): Secondary | ICD-10-CM | POA: Diagnosis not present

## 2023-11-04 DIAGNOSIS — Z87891 Personal history of nicotine dependence: Secondary | ICD-10-CM | POA: Diagnosis not present

## 2023-11-04 DIAGNOSIS — M109 Gout, unspecified: Secondary | ICD-10-CM | POA: Diagnosis not present

## 2023-11-04 DIAGNOSIS — I779 Disorder of arteries and arterioles, unspecified: Secondary | ICD-10-CM | POA: Diagnosis not present

## 2023-11-04 DIAGNOSIS — Z7901 Long term (current) use of anticoagulants: Secondary | ICD-10-CM | POA: Diagnosis not present

## 2023-11-04 DIAGNOSIS — I110 Hypertensive heart disease with heart failure: Secondary | ICD-10-CM | POA: Diagnosis not present

## 2023-11-04 DIAGNOSIS — M1990 Unspecified osteoarthritis, unspecified site: Secondary | ICD-10-CM | POA: Diagnosis not present

## 2023-11-04 DIAGNOSIS — S42302D Unspecified fracture of shaft of humerus, left arm, subsequent encounter for fracture with routine healing: Secondary | ICD-10-CM | POA: Diagnosis not present

## 2023-11-04 DIAGNOSIS — E785 Hyperlipidemia, unspecified: Secondary | ICD-10-CM | POA: Diagnosis not present

## 2023-11-04 DIAGNOSIS — D509 Iron deficiency anemia, unspecified: Secondary | ICD-10-CM | POA: Diagnosis not present

## 2023-11-04 DIAGNOSIS — I2510 Atherosclerotic heart disease of native coronary artery without angina pectoris: Secondary | ICD-10-CM | POA: Diagnosis not present

## 2023-11-04 DIAGNOSIS — I493 Ventricular premature depolarization: Secondary | ICD-10-CM | POA: Diagnosis not present

## 2023-11-04 DIAGNOSIS — I5033 Acute on chronic diastolic (congestive) heart failure: Secondary | ICD-10-CM | POA: Diagnosis not present

## 2023-11-04 NOTE — Telephone Encounter (Signed)
 DR. Ardis Krebs with Home Health called and states that the patients PT will end next week and she thinks he could use some more PT. Please call her back at [phone]

## 2023-11-05 ENCOUNTER — Encounter: Payer: Self-pay | Admitting: Nurse Practitioner

## 2023-11-05 ENCOUNTER — Ambulatory Visit: Attending: Nurse Practitioner | Admitting: Nurse Practitioner

## 2023-11-05 ENCOUNTER — Ambulatory Visit: Admitting: Urology

## 2023-11-05 VITALS — BP 116/62 | HR 74 | Ht 66.0 in | Wt 231.0 lb

## 2023-11-05 DIAGNOSIS — I10 Essential (primary) hypertension: Secondary | ICD-10-CM | POA: Diagnosis not present

## 2023-11-05 DIAGNOSIS — I4821 Permanent atrial fibrillation: Secondary | ICD-10-CM

## 2023-11-05 DIAGNOSIS — E669 Obesity, unspecified: Secondary | ICD-10-CM

## 2023-11-05 DIAGNOSIS — I255 Ischemic cardiomyopathy: Secondary | ICD-10-CM | POA: Diagnosis not present

## 2023-11-05 DIAGNOSIS — Z8679 Personal history of other diseases of the circulatory system: Secondary | ICD-10-CM | POA: Diagnosis not present

## 2023-11-05 DIAGNOSIS — E785 Hyperlipidemia, unspecified: Secondary | ICD-10-CM

## 2023-11-05 DIAGNOSIS — I2510 Atherosclerotic heart disease of native coronary artery without angina pectoris: Secondary | ICD-10-CM

## 2023-11-05 NOTE — Patient Instructions (Addendum)

## 2023-11-05 NOTE — Telephone Encounter (Signed)
 Surrett, Hope H  [place], [address], [place] Mallory w/[facility] [phone] lvm for Chantell Kunkler stating that she had just missed her call.  She stated that she doesn't want to extend his HH OT bc he's not homebound by any means.  She is wanting order for OP.  [phone]  I have sent order for out patient spoke to patient He states he has appointment today but Dr Marvina Slough  not in the office [facility] will RS him

## 2023-11-05 NOTE — Telephone Encounter (Signed)
 Dr Phyllis Breeze has approved 4 more weeks if he needs it left message for Mallory to call back let me know what he needs 2 wks 4 weeks, etc.

## 2023-11-05 NOTE — Progress Notes (Signed)
 Cardiology Office Note:  .   Date:  11/05/2023 ID:  Martin [name] [birth year], MRN [id number] PCP: Omie Bickers, MD  [facility] Providers Cardiologist:  Teddie Favre, MD Electrophysiologist:  Manya Sells, MD    History of Present Illness: .   Martin [name] is a 68 y.o. male with a PMH of CAD, ischemic cardiomyopathy, permanent A-fib, HTN, mild aortic valve stenosis, hx of symptomatic bradycardia, and mixed HLD, who presents today for 6 month follow-up.   Hx of angioplasty of LAD in 2001, stent interventions to RCA and LAD in 2011, and underwent CABG in 2020 (SVG - ramus intermedius, LIMA-LAD, SVG-PDA).   Last seen by Dr. Londa Rival on December 08, 2022. Was overall doing well at the time.   06/15/2023 - Today he presents for 6 month follow-up. He states he is doing very well and denies any acute cardiac complaints or concerns today.  He says he does cardio about 3 days/week, planning to become more active this year. Denies any chest pain, shortness of breath, palpitations, syncope, presyncope, dizziness, orthopnea, PND, swelling or significant weight changes, acute bleeding, or claudication.  In the interim, he has had a few ED visits and recent hospital stay. Hospitalized in March 2025 d/t acute on chronic diastolic CHF.  Prior to hospital stay, patient had fall and landed on the left arm, was noted to have a closed left humeral fracture and was instructed to follow-up with Ortho outpatient.   Today he presents for 6 month follow-up. Doing well.  Denies any acute cardiac complaints or issues. Denies any chest pain, shortness of breath, palpitations, syncope, presyncope, dizziness, orthopnea, PND, swelling or significant weight changes, acute bleeding, or claudication.  ROS: Negative. See HPI.   Studies Reviewed: Aaron Aas    EKG: EKG is not ordered today.  Echo 08/2023:  1. Left ventricular ejection fraction, by estimation, is 50 to 55%. The  left ventricle has low normal  function. The left ventricle demonstrates  global hypokinesis. There is mild concentric left ventricular hypertrophy.  Left ventricular diastolic  parameters are indeterminate.   2. Right ventricular systolic function is normal. The right ventricular  size is normal. Tricuspid regurgitation signal is inadequate for assessing  PA pressure.   3. Left atrial size was severely dilated.   4. Right atrial size was severely dilated.   5. The mitral valve is normal in structure. No evidence of mitral valve  regurgitation. No evidence of mitral stenosis.   6. The aortic valve is normal in structure. Aortic valve regurgitation is  not visualized. No aortic stenosis is present.   7. The inferior vena cava is normal in size with greater than 50%  respiratory variability, suggesting right atrial pressure of 3 mmHg.     Echo 12/2022:   1. Left ventricular ejection fraction, by estimation, is 50 to 55%. The  left ventricle has low normal function. Left ventricular endocardial  border not optimally defined to evaluate regional wall motion. The left ventricular internal cavity size was mildly dilated. There is mild concentric left ventricular hypertrophy. Left ventricular diastolic parameters are indeterminate.   2. Right ventricular systolic function is normal. The right ventricular size is mildly enlarged. Tricuspid regurgitation signal is inadequate for assessing PA pressure.   3. Left atrial size was moderately dilated.   4. Right atrial size was moderately dilated.   5. The mitral valve is grossly normal. Trivial mitral valve  regurgitation.   6. The aortic valve is tricuspid. There is  mild calcification of the  aortic valve. Aortic valve regurgitation is not visualized. Mild aortic  valve stenosis. Aortic valve area, by VTI measures 1.49 cm. Aortic valve mean gradient measures 13.0 mmHg.   7. The inferior vena cava is normal in size with greater than 50%  respiratory variability, suggesting right  atrial pressure of 3 mmHg.   Comparison(s): Prior images reviewed side by side. LVEF low normal at 50-55% at this time, difficult to assess focal wall motion. Mild calcific aortic stenosis with stable mean gradient.  Lexiscan  11/2021:    Findings are consistent with prior myocardial infarction. The study is low risk.   No ST deviation was noted.   LV perfusion is abnormal. There is no evidence of ischemia. There is evidence of infarction. Defect 1: There is a small defect with mild reduction in uptake present in the apical to basal inferior location(s) that is fixed. There is abnormal wall motion in the defect area. Consistent with infarction.   Left ventricular function is abnormal. Global function is moderately reduced. End diastolic cavity size is normal. End systolic cavity size is normal.   Small inferior wall infarct from apex to base no ischemia EF estimated at 42% but patient in afib   LHC 02/2019:  Severe three-vessel coronary calcification Patent left main Eccentric ostial 50 to 75% LAD with mid vessel 40 to 50% narrowing.  Mid to distal LAD stent is widely patent. Ramus intermedius contains proximal 90% stenosis. Circumflex contains 99% proximal/ostial stenosis. RCA is heavily calcified and stented.  The artery is totally occluded in the proximal to mid segment.  Left-to-right collaterals are noted. Mildly reduced LV systolic function with EF estimated to be 40%.  LVEDP is 28 mmHg.  Findings compatible with acute on chronic combined systolic and diastolic heart failure.   RECOMMENDATIONS:   IV nitroglycerin  is added to help control the patient's blood pressure and lower LVEDP. Low-dose beta-blocker therapy in the form of metoprolol  25 mg twice daily is added.  I have held diltiazem  given LV function. IV heparin  will be resumed. Surgical consultation is requested.      Physical Exam:   VS:  BP 116/62 (BP Location: Right Arm, Cuff Size: Large)   Pulse 74   Ht 5\' 6"  (1.676 m)    Wt 231 lb (104.8 kg)   SpO2 96%   BMI 37.28 kg/m    Wt Readings from Last 3 Encounters:  11/05/23 231 lb (104.8 kg)  09/06/23 228 lb (103.4 kg)  08/24/23 221 lb 12.5 oz (100.6 kg)    GEN: Obese, 68 year old male in no acute distress NECK: No JVD; No carotid bruits CARDIAC: S1/S2, irregularly irregular rhythm, no murmurs, rubs, gallops RESPIRATORY:  Clear to auscultation without rales, wheezing or rhonchi  ABDOMEN: Soft, non-tender, non-distended EXTREMITIES:  No edema; No deformity   ASSESSMENT AND PLAN: .    CAD, s/p CABG in 2020 Stable with no anginal symptoms. No indication for ischemic evaluation.  Not on aspirin  due to being on Eliquis .  Continue atorvastatin  and nitroglycerin  as needed. Heart healthy diet and regular cardiovascular exercise encouraged.   2. Permanent A-fib Denies any tachycardia or palpitations.  Heart rate is well-controlled, has not required AV nodal blockers.  Previous history of symptomatic bradycardia.  Continue Eliquis  for stroke prevention, denies any bleeding issues.  He is on appropriate dosage. No medication changes at this time.   3. Ischemic cardiomyopathy Stage C, NYHA class I symptoms. EF 50 to 55%.  Euvolemic well compensated on exam.  Continue current medication regimen. Low sodium diet, fluid restriction <2L, and daily weights encouraged. Educated to contact our office for weight gain of 2 lbs overnight or 5 lbs in one week.  4. HTN BP stable and at goal. Discussed to monitor BP at home at least 2 hours after medications and sitting for 5-10 minutes.  No medication changes at this time. Heart healthy diet and regular cardiovascular exercise encouraged.   5. HLD Labs faxed from PCPs office showed LDL of 38. Discussed LDL goal is less than 55. No medication changes at this time. Heart healthy diet and regular cardiovascular exercise encouraged.   6. Obesity Weight loss via diet and exercise encouraged. Discussed the impact being overweight  would have on cardiovascular risk.  7. Hx of aortic valve stenosis Noted on echocardiogram in July 2024, mean gradient 13.0 mmHg.  He is asymptomatic with this.  Most recent echocardiogram in March 2025 did not show evidence of aortic valve stenosis.  Will cancel upcoming echocardiogram scheduled for July 2025.  Dispo: Follow-up with MD/[facility] in 6 months or sooner if anything changes.  Signed, Lasalle Pointer, NP

## 2023-11-08 DIAGNOSIS — I2510 Atherosclerotic heart disease of native coronary artery without angina pectoris: Secondary | ICD-10-CM | POA: Diagnosis not present

## 2023-11-08 DIAGNOSIS — I110 Hypertensive heart disease with heart failure: Secondary | ICD-10-CM | POA: Diagnosis not present

## 2023-11-08 DIAGNOSIS — G4733 Obstructive sleep apnea (adult) (pediatric): Secondary | ICD-10-CM | POA: Diagnosis not present

## 2023-11-09 DIAGNOSIS — E785 Hyperlipidemia, unspecified: Secondary | ICD-10-CM | POA: Diagnosis not present

## 2023-11-09 DIAGNOSIS — I493 Ventricular premature depolarization: Secondary | ICD-10-CM | POA: Diagnosis not present

## 2023-11-09 DIAGNOSIS — Z8673 Personal history of transient ischemic attack (TIA), and cerebral infarction without residual deficits: Secondary | ICD-10-CM | POA: Diagnosis not present

## 2023-11-09 DIAGNOSIS — J302 Other seasonal allergic rhinitis: Secondary | ICD-10-CM | POA: Diagnosis not present

## 2023-11-09 DIAGNOSIS — I4819 Other persistent atrial fibrillation: Secondary | ICD-10-CM | POA: Diagnosis not present

## 2023-11-09 DIAGNOSIS — Z951 Presence of aortocoronary bypass graft: Secondary | ICD-10-CM | POA: Diagnosis not present

## 2023-11-09 DIAGNOSIS — I779 Disorder of arteries and arterioles, unspecified: Secondary | ICD-10-CM | POA: Diagnosis not present

## 2023-11-09 DIAGNOSIS — G4700 Insomnia, unspecified: Secondary | ICD-10-CM | POA: Diagnosis not present

## 2023-11-09 DIAGNOSIS — M109 Gout, unspecified: Secondary | ICD-10-CM | POA: Diagnosis not present

## 2023-11-09 DIAGNOSIS — I252 Old myocardial infarction: Secondary | ICD-10-CM | POA: Diagnosis not present

## 2023-11-09 DIAGNOSIS — G4733 Obstructive sleep apnea (adult) (pediatric): Secondary | ICD-10-CM | POA: Diagnosis not present

## 2023-11-09 DIAGNOSIS — I2510 Atherosclerotic heart disease of native coronary artery without angina pectoris: Secondary | ICD-10-CM | POA: Diagnosis not present

## 2023-11-09 DIAGNOSIS — L03116 Cellulitis of left lower limb: Secondary | ICD-10-CM | POA: Diagnosis not present

## 2023-11-09 DIAGNOSIS — L03119 Cellulitis of unspecified part of limb: Secondary | ICD-10-CM | POA: Insufficient documentation

## 2023-11-09 DIAGNOSIS — I5033 Acute on chronic diastolic (congestive) heart failure: Secondary | ICD-10-CM | POA: Diagnosis not present

## 2023-11-09 DIAGNOSIS — S91109A Unspecified open wound of unspecified toe(s) without damage to nail, initial encounter: Secondary | ICD-10-CM | POA: Insufficient documentation

## 2023-11-09 DIAGNOSIS — Z87891 Personal history of nicotine dependence: Secondary | ICD-10-CM | POA: Diagnosis not present

## 2023-11-09 DIAGNOSIS — Z7901 Long term (current) use of anticoagulants: Secondary | ICD-10-CM | POA: Diagnosis not present

## 2023-11-09 DIAGNOSIS — M1990 Unspecified osteoarthritis, unspecified site: Secondary | ICD-10-CM | POA: Diagnosis not present

## 2023-11-09 DIAGNOSIS — Z7951 Long term (current) use of inhaled steroids: Secondary | ICD-10-CM | POA: Diagnosis not present

## 2023-11-09 DIAGNOSIS — S42302D Unspecified fracture of shaft of humerus, left arm, subsequent encounter for fracture with routine healing: Secondary | ICD-10-CM | POA: Diagnosis not present

## 2023-11-09 DIAGNOSIS — R7303 Prediabetes: Secondary | ICD-10-CM | POA: Diagnosis not present

## 2023-11-09 DIAGNOSIS — Z955 Presence of coronary angioplasty implant and graft: Secondary | ICD-10-CM | POA: Diagnosis not present

## 2023-11-09 DIAGNOSIS — I4891 Unspecified atrial fibrillation: Secondary | ICD-10-CM | POA: Diagnosis not present

## 2023-11-09 DIAGNOSIS — I110 Hypertensive heart disease with heart failure: Secondary | ICD-10-CM | POA: Diagnosis not present

## 2023-11-09 DIAGNOSIS — D509 Iron deficiency anemia, unspecified: Secondary | ICD-10-CM | POA: Diagnosis not present

## 2023-11-10 ENCOUNTER — Ambulatory Visit: Admitting: Nurse Practitioner

## 2023-11-11 DIAGNOSIS — D509 Iron deficiency anemia, unspecified: Secondary | ICD-10-CM | POA: Diagnosis not present

## 2023-11-11 DIAGNOSIS — S42302D Unspecified fracture of shaft of humerus, left arm, subsequent encounter for fracture with routine healing: Secondary | ICD-10-CM | POA: Diagnosis not present

## 2023-11-11 DIAGNOSIS — I4819 Other persistent atrial fibrillation: Secondary | ICD-10-CM | POA: Diagnosis not present

## 2023-11-11 DIAGNOSIS — G4733 Obstructive sleep apnea (adult) (pediatric): Secondary | ICD-10-CM | POA: Diagnosis not present

## 2023-11-11 DIAGNOSIS — I110 Hypertensive heart disease with heart failure: Secondary | ICD-10-CM | POA: Diagnosis not present

## 2023-11-11 DIAGNOSIS — Z7901 Long term (current) use of anticoagulants: Secondary | ICD-10-CM | POA: Diagnosis not present

## 2023-11-11 DIAGNOSIS — Z951 Presence of aortocoronary bypass graft: Secondary | ICD-10-CM | POA: Diagnosis not present

## 2023-11-11 DIAGNOSIS — Z955 Presence of coronary angioplasty implant and graft: Secondary | ICD-10-CM | POA: Diagnosis not present

## 2023-11-11 DIAGNOSIS — E785 Hyperlipidemia, unspecified: Secondary | ICD-10-CM | POA: Diagnosis not present

## 2023-11-11 DIAGNOSIS — I493 Ventricular premature depolarization: Secondary | ICD-10-CM | POA: Diagnosis not present

## 2023-11-11 DIAGNOSIS — I252 Old myocardial infarction: Secondary | ICD-10-CM | POA: Diagnosis not present

## 2023-11-11 DIAGNOSIS — M1990 Unspecified osteoarthritis, unspecified site: Secondary | ICD-10-CM | POA: Diagnosis not present

## 2023-11-11 DIAGNOSIS — G4700 Insomnia, unspecified: Secondary | ICD-10-CM | POA: Diagnosis not present

## 2023-11-11 DIAGNOSIS — Z87891 Personal history of nicotine dependence: Secondary | ICD-10-CM | POA: Diagnosis not present

## 2023-11-11 DIAGNOSIS — M109 Gout, unspecified: Secondary | ICD-10-CM | POA: Diagnosis not present

## 2023-11-11 DIAGNOSIS — Z8673 Personal history of transient ischemic attack (TIA), and cerebral infarction without residual deficits: Secondary | ICD-10-CM | POA: Diagnosis not present

## 2023-11-11 DIAGNOSIS — I2510 Atherosclerotic heart disease of native coronary artery without angina pectoris: Secondary | ICD-10-CM | POA: Diagnosis not present

## 2023-11-11 DIAGNOSIS — J302 Other seasonal allergic rhinitis: Secondary | ICD-10-CM | POA: Diagnosis not present

## 2023-11-11 DIAGNOSIS — Z7951 Long term (current) use of inhaled steroids: Secondary | ICD-10-CM | POA: Diagnosis not present

## 2023-11-11 DIAGNOSIS — I5033 Acute on chronic diastolic (congestive) heart failure: Secondary | ICD-10-CM | POA: Diagnosis not present

## 2023-11-11 DIAGNOSIS — I779 Disorder of arteries and arterioles, unspecified: Secondary | ICD-10-CM | POA: Diagnosis not present

## 2023-11-16 ENCOUNTER — Ambulatory Visit: Admitting: Adult Health

## 2023-11-16 ENCOUNTER — Encounter: Payer: Self-pay | Admitting: Adult Health

## 2023-11-16 VITALS — BP 125/61 | HR 75 | Ht 66.0 in | Wt 228.0 lb

## 2023-11-16 DIAGNOSIS — G4733 Obstructive sleep apnea (adult) (pediatric): Secondary | ICD-10-CM | POA: Diagnosis not present

## 2023-11-16 DIAGNOSIS — Z9981 Dependence on supplemental oxygen: Secondary | ICD-10-CM | POA: Diagnosis not present

## 2023-11-16 DIAGNOSIS — J90 Pleural effusion, not elsewhere classified: Secondary | ICD-10-CM | POA: Diagnosis not present

## 2023-11-16 DIAGNOSIS — I5032 Chronic diastolic (congestive) heart failure: Secondary | ICD-10-CM

## 2023-11-16 DIAGNOSIS — Z87891 Personal history of nicotine dependence: Secondary | ICD-10-CM | POA: Diagnosis not present

## 2023-11-16 NOTE — Assessment & Plan Note (Signed)
 Long standing sleep apnea on CPAP . Recent repeat HST showed severe Sleep apnea, received new CPAP machine last week, has excellent compliance and control with perceived benefit.  Continue on current settings.  Will refer to DME for mask fitting.  Plan  Patient Instructions  Set up for CPAP mask fitting.  Continue on CPAP At bedtime, wear all night long.  Continue follow up with Cardiology.  Follow up with Dr. Villa Greaser or Rajanee Schuelke NP in 6 months and As needed

## 2023-11-16 NOTE — Assessment & Plan Note (Signed)
Appears euvolemic on exam.  Continue follow-up with cardiology and current maintenance regimen 

## 2023-11-16 NOTE — Assessment & Plan Note (Signed)
 Bilateral pleural effusions stable on most recent CT chest January.  Chest x-ray March 2025 showed stable effusions.  Continue on current regimen and follow-up with cardiology.

## 2023-11-16 NOTE — Patient Instructions (Signed)
 Set up for CPAP mask fitting.  Continue on CPAP At bedtime, wear all night long.  Continue follow up with Cardiology.  Follow up with Dr. Villa Greaser or Alyannah Sanks NP in 6 months and As needed

## 2023-11-16 NOTE — Progress Notes (Signed)
 @Patient  ID: Martin [name], male    DOB: 1956/04/25, 68 y.o.   MRN: [id number]  Chief Complaint  Patient presents with   Follow-up    Referring provider: Omie Bickers, MD  HPI: 68 year old former smoker obstructive sleep apnea and chronic pleural effusions Medical history significant for coronary artery disease status post CABG 2020, stroke, A-fib, congestive heart failure, alcohol , THC, and abuse.  OSA>Dx 2001, on CPAP since Dx. New CPAP 2017   TEST/EVENTS :  01/29/22 RT thora >> 550cc -lymphocytic exudate, cytology neg   CT chest wo con 01/2022 >>moderate to large sized infiltrate in right lower lobe .Small to moderate right pleural effusion. Small left pleural effusion.   NPSG 04/2016 at [facility] showed AHI of 29/hour with lowest desaturation of 86%  CT Chest 06/2023 >small loculated pleural effusion bilaterally L>R, stable rounded atx RLL   11/16/2023 Follow up ; OSA, Pleural Effusion, CHF  Patient returns for a 6 month follow up. Has long standing sleep apnea on CPAP. Recently CPAP machine stopped working. He required a new sleep study in order to get new CPAP machine. Home sleep study 09/08/2023 showed severe sleep apnea with AHI at 61/hr, SpO2 77%. He received his new CPAP 1 week ago. Says it is working very well. Got new full face mask, feels it leaks constantly. CPAP download shows 100% usage over last week, daily usage at 8.5hr, on CPAP 12cmH2o. AHI 2.3/hr . Feels he is benefiting from CPAP with improved sleep and less daytime sleepiness.   Has known chronic bilateral pleural effusions. CT chest January 2025 showed stable small loculated pleural effusion L>R .  Says overall breathing is doing okay.  He denies any flare of cough or wheezing.  No orthopnea.  Patient remains independent.  Is able to drive.  Lives alone.  Does his light house chores and shopping.  He is unable to do yard work.  Patient is followed by cardiology for coronary artery disease and congestive heart  failure. Admitted in March 2025 with decompensated CHF, Closed left humeral fracture. Admitted after fall, improved with diuresis. Continued on maintenance regimen with Entrestro, Spironolactone , and Empagliflozin .  Says that overall lower extremity swelling has been doing okay.  Recently had a left lower leg skin infection which she says has improved with antibiotics followed by primary care   Allergies  Allergen Reactions   Penicillins Hives and Rash    Immunization History  Administered Date(s) Administered   Fluad Quad(high Dose 65+) 04/09/2019, 03/13/2021, 03/07/2022   Influenza,inj,Quad PF,6+ Mos 02/11/2016, 03/23/2017, 03/07/2018   Influenza-Unspecified 04/07/2017   Moderna Sars-Covid-2 Vaccination 07/20/2019, 08/17/2019   PNEUMOCOCCAL CONJUGATE-20 08/05/2022   Pneumococcal Polysaccharide-23 05/24/2007   Tdap 04/19/2018   Zoster Recombinant(Shingrix) 01/04/2018, 03/10/2018   Zoster, Live 03/11/2015    Past Medical History:  Diagnosis Date   (HFpEF) heart failure with preserved ejection fraction (HCC)    CAD (coronary artery disease)    a. s/p PCI to LAD 2001. b. s/p stenting to the RCA and mid LAD December 2011, residual distal RCA disease treated medically. c.  Multivessel disease status post CABG October 2020   Carotid artery disease [facility])    Essential hypertension    GERD (gastroesophageal reflux disease)    Habitual alcohol  use    Hyperlipidemia    Low back pain    Myocardial infarction (HCC) 2020   OSA on CPAP    Osteoarthritis    Permanent atrial fibrillation (HCC)    a. dx 10/2017  Polysubstance abuse (HCC)    History of marijuana and Vicodin abuse   PVC's (premature ventricular contractions)    Seasonal allergies    Stroke (HCC) 2020    Tobacco History: Social History   Tobacco Use  Smoking Status Former   Current packs/day: 0.00   Average packs/day: 1 pack/day for 35.0 years (35.0 ttl pk-yrs)   Types: Cigarettes   Start date: 09/18/1967   Quit  date: 06/09/1999   Years since quitting: 24.4  Smokeless Tobacco Never   Counseling given: Not Answered   Outpatient Medications Prior to Visit  Medication Sig Dispense Refill   acetaminophen  (TYLENOL ) 500 MG tablet Take 2 tablets (1,000 mg total) by mouth every 8 (eight) hours. 30 tablet 0   allopurinol  (ZYLOPRIM ) 300 MG tablet Take 300 mg by mouth daily.      apixaban  (ELIQUIS ) 5 MG TABS tablet TAKE ONE TABLET BY MOUTH TWICE DAILY 180 tablet 1   atorvastatin  (LIPITOR ) 80 MG tablet TAKE ONE TABLET BY MOUTH EVERY DAY 90 tablet 1   Cholecalciferol (VITAMIN D-3) 1000 UNITS CAPS Take 1,000 Units by mouth every morning.      Cyanocobalamin  (VITAMIN B-12) 2500 MCG SUBL Place 2,500 mcg under the tongue every morning.      diclofenac  Sodium (VOLTAREN ) 1 % GEL Apply 4 g topically 4 (four) times daily as needed (Pain).     docusate sodium  (COLACE) 100 MG capsule Take 2 capsules (200 mg total) by mouth daily. (Patient taking differently: Take 100 mg by mouth at bedtime as needed.) 60 capsule 0   doxycycline  (VIBRAMYCIN ) 100 MG capsule Take 100 mg by mouth 2 (two) times daily.     ENTRESTO  24-26 MG TAKE ONE TABLET BY MOUTH TWICE DAILY 60 tablet 11   escitalopram (LEXAPRO) 10 MG tablet Take 10 mg by mouth daily.     fluticasone  (FLONASE ) 50 MCG/ACT nasal spray Place 2 sprays into the nose daily.     folic acid  (FOLVITE ) 1 MG tablet TAKE ONE TABLET BY MOUTH EVERY DAY 30 tablet 6   furosemide  (LASIX ) 40 MG tablet Take 1 tablet (40 mg total) by mouth daily.     HYDROcodone -acetaminophen  (NORCO/VICODIN) 5-325 MG tablet Take 1 tablet by mouth every 8 (eight) hours as needed for moderate pain (pain score 4-6). 15 tablet 0   loratadine  (CLARITIN ) 10 MG tablet Take 10 mg by mouth daily.     LORazepam  (ATIVAN ) 0.5 MG tablet Take 0.5 mg by mouth at bedtime.      meclizine  (ANTIVERT ) 32 MG tablet Take 32 mg by mouth daily as needed for dizziness.      mupirocin  ointment (BACTROBAN ) 2 % Apply 1 Application  topically 2 (two) times daily.     nitroGLYCERIN  (NITROSTAT ) 0.4 MG SL tablet Place 1 tablet (0.4 mg total) under the tongue every 5 (five) minutes as needed for chest pain. 25 tablet 3   pantoprazole  (PROTONIX ) 40 MG tablet Take 1 tablet (40 mg total) by mouth daily. 30 tablet 0   polyethylene glycol (MIRALAX  / GLYCOLAX ) packet Take 17 g by mouth at bedtime as needed for mild constipation.     spironolactone  (ALDACTONE ) 25 MG tablet TAKE 1/2 TABLET BY MOUTH DAILY 45 tablet 3   tamsulosin  (FLOMAX ) 0.4 MG CAPS capsule Take 0.4 mg by mouth at bedtime.     No facility-administered medications prior to visit.     Review of Systems:   Constitutional:   No  weight loss, night sweats,  Fevers, chills, +fatigue, or  lassitude.  HEENT:   No headaches,  Difficulty swallowing,  Tooth/dental problems, or  Sore throat,                No sneezing, itching, ear ache, nasal congestion, post nasal drip,   CV:  No chest pain,  Orthopnea, PND, +swelling in lower extremities, anasarca, dizziness, palpitations, syncope.   GI  No heartburn, indigestion, abdominal pain, nausea, vomiting, diarrhea, change in bowel habits, loss of appetite, bloody stools.   Resp:   No chest wall deformity  Skin: no rash or lesions.  GU: no dysuria, change in color of urine, no urgency or frequency.  No flank pain, no hematuria   MS:  No joint pain or swelling.  No decreased range of motion.  No back pain.    Physical Exam  BP 125/61 (BP Location: Left Arm)   Pulse 75   Ht 5\' 6"  (1.676 m)   Wt 228 lb (103.4 kg)   SpO2 94% Comment: RA  BMI 36.80 kg/m   GEN: A/Ox3; pleasant , NAD, elderly    HEENT:  [place]/AT,  NOSE-clear, THROAT-clear, no lesions, no postnasal drip or exudate noted.   NECK:  Supple w/ fair ROM; no JVD; normal carotid impulses w/o bruits; no thyromegaly or nodules palpated; no lymphadenopathy.    RESP  Clear  P & A; w/o, wheezes/ rales/ or rhonchi. no accessory muscle use, no dullness to  percussion  CARD:  RRR, no m/r/g, Tr peripheral edema, pulses intact, no cyanosis or clubbing.  GI:   Soft & nt; nml bowel sounds; no organomegaly or masses detected.   Musco: Warm bil, no deformities or joint swelling noted.   Neuro: alert, no focal deficits noted.    Skin: Warm,  LLE leg wrap in place .     Lab Results:  CBC    Component Value Date/Time   WBC 8.2 08/22/2023 0415   RBC 3.94 (L) 08/22/2023 0415   HGB 12.2 (L) 08/22/2023 0415   HCT 37.1 (L) 08/22/2023 0415   PLT 146 (L) 08/22/2023 0415   MCV 94.2 08/22/2023 0415   MCH 31.0 08/22/2023 0415   MCHC 32.9 08/22/2023 0415   RDW 15.6 (H) 08/22/2023 0415   LYMPHSABS 0.7 08/21/2023 2143   MONOABS 0.5 08/21/2023 2143   EOSABS 0.0 08/21/2023 2143   BASOSABS 0.0 08/21/2023 2143    BMET  ProBNP No results found for: "PROBNP"  Imaging: No results found.  Administration History     None          Latest Ref Rng & Units 03/09/2019    8:26 AM  PFT Results  FVC-Pre L 2.64   FVC-Predicted Pre % 61   Pre FEV1/FVC % % 71   FEV1-Pre L 1.87   FEV1-Predicted Pre % 58     No results found for: "NITRICOXIDE"      Assessment & Plan:   OSA on CPAP Long standing sleep apnea on CPAP . Recent repeat HST showed severe Sleep apnea, received new CPAP machine last week, has excellent compliance and control with perceived benefit.  Continue on current settings.  Will refer to DME for mask fitting.  Plan  Patient Instructions  Set up for CPAP mask fitting.  Continue on CPAP At bedtime, wear all night long.  Continue follow up with Cardiology.  Follow up with Dr. Villa Greaser or Taz Vanness NP in 6 months and As needed      Pleural effusion Bilateral pleural effusions stable on most recent CT chest  January.  Chest x-ray March 2025 showed stable effusions.  Continue on current regimen and follow-up with cardiology.  Chronic diastolic congestive heart failure (HCC) Appears euvolemic on exam.  Continue follow-up with  cardiology and current maintenance regimen.     Roena Clark, NP 11/16/2023

## 2023-11-17 DIAGNOSIS — Z8709 Personal history of other diseases of the respiratory system: Secondary | ICD-10-CM | POA: Diagnosis not present

## 2023-11-17 DIAGNOSIS — I10 Essential (primary) hypertension: Secondary | ICD-10-CM | POA: Diagnosis not present

## 2023-11-17 DIAGNOSIS — D509 Iron deficiency anemia, unspecified: Secondary | ICD-10-CM | POA: Diagnosis not present

## 2023-11-17 DIAGNOSIS — M545 Low back pain, unspecified: Secondary | ICD-10-CM | POA: Diagnosis not present

## 2023-11-17 DIAGNOSIS — I6529 Occlusion and stenosis of unspecified carotid artery: Secondary | ICD-10-CM | POA: Diagnosis not present

## 2023-11-17 DIAGNOSIS — I509 Heart failure, unspecified: Secondary | ICD-10-CM | POA: Diagnosis not present

## 2023-11-17 DIAGNOSIS — I110 Hypertensive heart disease with heart failure: Secondary | ICD-10-CM | POA: Diagnosis not present

## 2023-11-17 DIAGNOSIS — G4733 Obstructive sleep apnea (adult) (pediatric): Secondary | ICD-10-CM | POA: Diagnosis not present

## 2023-11-17 DIAGNOSIS — G894 Chronic pain syndrome: Secondary | ICD-10-CM | POA: Diagnosis not present

## 2023-11-17 DIAGNOSIS — I2510 Atherosclerotic heart disease of native coronary artery without angina pectoris: Secondary | ICD-10-CM | POA: Diagnosis not present

## 2023-11-17 DIAGNOSIS — I4891 Unspecified atrial fibrillation: Secondary | ICD-10-CM | POA: Diagnosis not present

## 2023-11-17 DIAGNOSIS — S4292XD Fracture of left shoulder girdle, part unspecified, subsequent encounter for fracture with routine healing: Secondary | ICD-10-CM | POA: Diagnosis not present

## 2023-11-22 ENCOUNTER — Other Ambulatory Visit (INDEPENDENT_AMBULATORY_CARE_PROVIDER_SITE_OTHER)

## 2023-11-22 ENCOUNTER — Ambulatory Visit (INDEPENDENT_AMBULATORY_CARE_PROVIDER_SITE_OTHER): Admitting: Orthopedic Surgery

## 2023-11-22 DIAGNOSIS — S4292XD Fracture of left shoulder girdle, part unspecified, subsequent encounter for fracture with routine healing: Secondary | ICD-10-CM | POA: Diagnosis not present

## 2023-11-22 DIAGNOSIS — M84375G Stress fracture, left foot, subsequent encounter for fracture with delayed healing: Secondary | ICD-10-CM

## 2023-11-22 NOTE — Progress Notes (Signed)
   There were no vitals taken for this visit.  There is no height or weight on file to calculate BMI.  Chief Complaint  Patient presents with   Shoulder Injury    Left     Encounter Diagnosis  Name Primary?   Closed fracture of left shoulder with routine healing, subsequent encounter Yes    DOI/DOS/ Date: 08/21/23  Improved Has done therapy, is helping

## 2023-11-22 NOTE — Progress Notes (Signed)
 Fracture care follow-up left proximal humerus fracture  Chief Complaint  Patient presents with   Shoulder Injury    Left     Encounter Diagnoses  Name Primary?   Closed fracture of left shoulder with routine healing, subsequent encounter 08/21/23    Stress fracture of metatarsal bone of left foot with delayed healing, subsequent encounter Yes    X-ray shows fracture healing  Clinical exam shows painless active range of motion with flexion up to 110 degrees  Passive motion 120 degrees of flexion with 40 degrees of external rotation when the arm is at the side  DG Shoulder Left Result Date: 11/22/2023 X-rays for proximal humerus fracture March 15 Proximal humerus fracture essentially nondisplaced on the greater tuberosity lower than it was probably before injury.  Nonetheless fracture has healed humeral joint looks normal except for perhaps a slight inferior osteophyte on the humerus Overall healed fracture left shoulder proximal humerus    Patient was released today continue stretching strengthening as tolerated

## 2023-11-22 NOTE — Patient Instructions (Signed)
 While we are working on your approval for MRI please go ahead and call to schedule your appointment with [facility] within at least one (1) week.   Central Scheduling [phone]

## 2023-11-23 DIAGNOSIS — I2510 Atherosclerotic heart disease of native coronary artery without angina pectoris: Secondary | ICD-10-CM | POA: Diagnosis not present

## 2023-11-23 DIAGNOSIS — K219 Gastro-esophageal reflux disease without esophagitis: Secondary | ICD-10-CM | POA: Diagnosis not present

## 2023-11-23 DIAGNOSIS — G4733 Obstructive sleep apnea (adult) (pediatric): Secondary | ICD-10-CM | POA: Diagnosis not present

## 2023-11-23 DIAGNOSIS — D509 Iron deficiency anemia, unspecified: Secondary | ICD-10-CM | POA: Diagnosis not present

## 2023-11-23 DIAGNOSIS — I4891 Unspecified atrial fibrillation: Secondary | ICD-10-CM | POA: Diagnosis not present

## 2023-11-23 DIAGNOSIS — M109 Gout, unspecified: Secondary | ICD-10-CM | POA: Diagnosis not present

## 2023-11-23 DIAGNOSIS — M5451 Vertebrogenic low back pain: Secondary | ICD-10-CM | POA: Diagnosis not present

## 2023-11-23 DIAGNOSIS — I110 Hypertensive heart disease with heart failure: Secondary | ICD-10-CM | POA: Diagnosis not present

## 2023-11-23 DIAGNOSIS — J302 Other seasonal allergic rhinitis: Secondary | ICD-10-CM | POA: Diagnosis not present

## 2023-11-23 DIAGNOSIS — E782 Mixed hyperlipidemia: Secondary | ICD-10-CM | POA: Diagnosis not present

## 2023-11-29 DIAGNOSIS — Z79899 Other long term (current) drug therapy: Secondary | ICD-10-CM | POA: Diagnosis not present

## 2023-11-29 DIAGNOSIS — Z880 Allergy status to penicillin: Secondary | ICD-10-CM | POA: Diagnosis not present

## 2023-11-29 DIAGNOSIS — S0990XA Unspecified injury of head, initial encounter: Secondary | ICD-10-CM | POA: Diagnosis not present

## 2023-11-29 DIAGNOSIS — S52502A Unspecified fracture of the lower end of left radius, initial encounter for closed fracture: Secondary | ICD-10-CM | POA: Diagnosis not present

## 2023-11-29 DIAGNOSIS — S1989XA Other specified injuries of other specified part of neck, initial encounter: Secondary | ICD-10-CM | POA: Diagnosis not present

## 2023-11-29 DIAGNOSIS — S52592A Other fractures of lower end of left radius, initial encounter for closed fracture: Secondary | ICD-10-CM | POA: Diagnosis not present

## 2023-11-29 DIAGNOSIS — S80212A Abrasion, left knee, initial encounter: Secondary | ICD-10-CM | POA: Diagnosis not present

## 2023-11-29 DIAGNOSIS — M25532 Pain in left wrist: Secondary | ICD-10-CM | POA: Diagnosis not present

## 2023-11-29 DIAGNOSIS — I10 Essential (primary) hypertension: Secondary | ICD-10-CM | POA: Diagnosis not present

## 2023-11-29 DIAGNOSIS — W1839XA Other fall on same level, initial encounter: Secondary | ICD-10-CM | POA: Diagnosis not present

## 2023-11-29 DIAGNOSIS — S098XXA Other specified injuries of head, initial encounter: Secondary | ICD-10-CM | POA: Diagnosis not present

## 2023-11-29 DIAGNOSIS — M8580 Other specified disorders of bone density and structure, unspecified site: Secondary | ICD-10-CM | POA: Diagnosis not present

## 2023-11-29 DIAGNOSIS — Z87891 Personal history of nicotine dependence: Secondary | ICD-10-CM | POA: Diagnosis not present

## 2023-11-29 DIAGNOSIS — E785 Hyperlipidemia, unspecified: Secondary | ICD-10-CM | POA: Diagnosis not present

## 2023-11-29 DIAGNOSIS — Z7902 Long term (current) use of antithrombotics/antiplatelets: Secondary | ICD-10-CM | POA: Diagnosis not present

## 2023-11-30 ENCOUNTER — Ambulatory Visit (HOSPITAL_COMMUNITY): Admission: RE | Admit: 2023-11-30 | Source: Ambulatory Visit

## 2023-11-30 DIAGNOSIS — S52509A Unspecified fracture of the lower end of unspecified radius, initial encounter for closed fracture: Secondary | ICD-10-CM | POA: Insufficient documentation

## 2023-11-30 DIAGNOSIS — Z7409 Other reduced mobility: Secondary | ICD-10-CM | POA: Diagnosis not present

## 2023-11-30 DIAGNOSIS — Z9181 History of falling: Secondary | ICD-10-CM | POA: Diagnosis not present

## 2023-11-30 DIAGNOSIS — S52502K Unspecified fracture of the lower end of left radius, subsequent encounter for closed fracture with nonunion: Secondary | ICD-10-CM | POA: Diagnosis not present

## 2023-11-30 DIAGNOSIS — Z789 Other specified health status: Secondary | ICD-10-CM | POA: Diagnosis not present

## 2023-12-03 DIAGNOSIS — S52502A Unspecified fracture of the lower end of left radius, initial encounter for closed fracture: Secondary | ICD-10-CM | POA: Diagnosis not present

## 2023-12-06 DIAGNOSIS — S62102A Fracture of unspecified carpal bone, left wrist, initial encounter for closed fracture: Secondary | ICD-10-CM | POA: Diagnosis not present

## 2023-12-06 DIAGNOSIS — M1812 Unilateral primary osteoarthritis of first carpometacarpal joint, left hand: Secondary | ICD-10-CM | POA: Diagnosis not present

## 2023-12-06 DIAGNOSIS — S52322A Displaced transverse fracture of shaft of left radius, initial encounter for closed fracture: Secondary | ICD-10-CM | POA: Diagnosis not present

## 2023-12-07 DIAGNOSIS — S52502A Unspecified fracture of the lower end of left radius, initial encounter for closed fracture: Secondary | ICD-10-CM | POA: Diagnosis not present

## 2023-12-07 DIAGNOSIS — E782 Mixed hyperlipidemia: Secondary | ICD-10-CM | POA: Diagnosis not present

## 2023-12-07 DIAGNOSIS — I4891 Unspecified atrial fibrillation: Secondary | ICD-10-CM | POA: Diagnosis not present

## 2023-12-07 DIAGNOSIS — I10 Essential (primary) hypertension: Secondary | ICD-10-CM | POA: Diagnosis not present

## 2023-12-13 ENCOUNTER — Other Ambulatory Visit: Payer: Medicare Other

## 2023-12-16 ENCOUNTER — Ambulatory Visit: Payer: Medicare Other | Admitting: Nurse Practitioner

## 2023-12-17 ENCOUNTER — Encounter: Admitting: Orthopedic Surgery

## 2023-12-17 DIAGNOSIS — S52502A Unspecified fracture of the lower end of left radius, initial encounter for closed fracture: Secondary | ICD-10-CM | POA: Diagnosis not present

## 2023-12-27 DIAGNOSIS — S80811A Abrasion, right lower leg, initial encounter: Secondary | ICD-10-CM | POA: Diagnosis not present

## 2023-12-28 ENCOUNTER — Ambulatory Visit (HOSPITAL_COMMUNITY)
Admission: RE | Admit: 2023-12-28 | Discharge: 2023-12-28 | Disposition: A | Discharge status: Home / Self Care | Source: Ambulatory Visit | Attending: Orthopedic Surgery | Admitting: Orthopedic Surgery

## 2023-12-28 DIAGNOSIS — M84375G Stress fracture, left foot, subsequent encounter for fracture with delayed healing: Secondary | ICD-10-CM | POA: Diagnosis not present

## 2023-12-28 DIAGNOSIS — M8580 Other specified disorders of bone density and structure, unspecified site: Secondary | ICD-10-CM | POA: Diagnosis not present

## 2023-12-28 DIAGNOSIS — M19072 Primary osteoarthritis, left ankle and foot: Secondary | ICD-10-CM | POA: Diagnosis not present

## 2023-12-28 DIAGNOSIS — R600 Localized edema: Secondary | ICD-10-CM | POA: Diagnosis not present

## 2023-12-29 DIAGNOSIS — M109 Gout, unspecified: Secondary | ICD-10-CM | POA: Diagnosis not present

## 2023-12-29 DIAGNOSIS — I4891 Unspecified atrial fibrillation: Secondary | ICD-10-CM | POA: Diagnosis not present

## 2023-12-29 DIAGNOSIS — K219 Gastro-esophageal reflux disease without esophagitis: Secondary | ICD-10-CM | POA: Diagnosis not present

## 2023-12-29 DIAGNOSIS — G4733 Obstructive sleep apnea (adult) (pediatric): Secondary | ICD-10-CM | POA: Diagnosis not present

## 2023-12-29 DIAGNOSIS — J302 Other seasonal allergic rhinitis: Secondary | ICD-10-CM | POA: Diagnosis not present

## 2023-12-29 DIAGNOSIS — E782 Mixed hyperlipidemia: Secondary | ICD-10-CM | POA: Diagnosis not present

## 2023-12-29 DIAGNOSIS — I110 Hypertensive heart disease with heart failure: Secondary | ICD-10-CM | POA: Diagnosis not present

## 2023-12-29 DIAGNOSIS — I2510 Atherosclerotic heart disease of native coronary artery without angina pectoris: Secondary | ICD-10-CM | POA: Diagnosis not present

## 2023-12-29 DIAGNOSIS — M5451 Vertebrogenic low back pain: Secondary | ICD-10-CM | POA: Diagnosis not present

## 2023-12-29 DIAGNOSIS — D509 Iron deficiency anemia, unspecified: Secondary | ICD-10-CM | POA: Diagnosis not present

## 2023-12-30 DIAGNOSIS — S52502D Unspecified fracture of the lower end of left radius, subsequent encounter for closed fracture with routine healing: Secondary | ICD-10-CM | POA: Diagnosis not present

## 2023-12-30 NOTE — Procedures (Signed)
 The patient arrived to the sleep center with c/o mask leakage. The patient is currently using a Resmed P10 pillow mask. Due to oral venting the patients interface was changed to a [employer] Simplus Nasal/Oral mask (Medium). The patient tolerated the trial well with no noted complciations. The trial mask was given to the patient for home use.  -VB

## 2023-12-30 NOTE — Procedures (Signed)
 The patient arrived to the [facility] stating that his mask leaks. The patient was fitted with the [employer] Ev[place] FFM (Large) at a pressure of 12cm H20 EPR of 2. The patient tolerated the trial well with no noted complications. The trial mask was given to the patient for home use.  -VB

## 2024-01-05 ENCOUNTER — Other Ambulatory Visit: Payer: Self-pay | Admitting: Internal Medicine

## 2024-01-07 DIAGNOSIS — I10 Essential (primary) hypertension: Secondary | ICD-10-CM | POA: Diagnosis not present

## 2024-01-07 DIAGNOSIS — I5042 Chronic combined systolic (congestive) and diastolic (congestive) heart failure: Secondary | ICD-10-CM | POA: Diagnosis not present

## 2024-01-07 DIAGNOSIS — I4891 Unspecified atrial fibrillation: Secondary | ICD-10-CM | POA: Diagnosis not present

## 2024-01-07 DIAGNOSIS — E782 Mixed hyperlipidemia: Secondary | ICD-10-CM | POA: Diagnosis not present

## 2024-01-10 ENCOUNTER — Ambulatory Visit: Admitting: Orthopedic Surgery

## 2024-01-10 ENCOUNTER — Encounter: Payer: Self-pay | Admitting: Orthopedic Surgery

## 2024-01-10 DIAGNOSIS — M84375G Stress fracture, left foot, subsequent encounter for fracture with delayed healing: Secondary | ICD-10-CM

## 2024-01-10 DIAGNOSIS — S91109A Unspecified open wound of unspecified toe(s) without damage to nail, initial encounter: Secondary | ICD-10-CM | POA: Diagnosis not present

## 2024-01-10 MED ORDER — SULFAMETHOXAZOLE-TRIMETHOPRIM 800-160 MG PO TABS: 1.0000 | ORAL_TABLET | Freq: Two times a day (BID) | ORAL | 0 refills | Status: DC

## 2024-01-10 NOTE — Progress Notes (Signed)
 Chief Complaint  Patient presents with   Foot Pain    Left     68 year old male with multiple vascular issues cardiac and lower extremity history of atrial fibrillation comes in complaining of pain over the great toe of his left foot.  There is an area of redness and tenderness over the left great toe there is no drainage.  He appears to have a bone spur in that area.  A month ago he had an MRI which showed midfoot arthritis.  This appears to be unrelated  He has poor vascularity in terms of outflow with multiple areas of discoloration in his tibial areas bilaterally.  Impression  Encounter Diagnoses  Name Primary?   Stress fracture of metatarsal bone of left foot with delayed healing, subsequent encounter    Open wound of toe, initial encounter Yes   Recommend antibiotic check in a week  Meds ordered this encounter  Medications   sulfamethoxazole -trimethoprim  (BACTRIM  DS) 800-160 MG tablet    Sig: Take 1 tablet by mouth 2 (two) times daily.    Dispense:  28 tablet    Refill:  0

## 2024-01-10 NOTE — Progress Notes (Signed)
   There were no vitals taken for this visit.  There is no height or weight on file to calculate BMI.  Chief Complaint  Patient presents with   Foot Pain    Left     No diagnosis found.  DOI/DOS/ Date: 12/28/23 had MRI   Unchanged

## 2024-01-13 ENCOUNTER — Ambulatory Visit: Admitting: Orthopedic Surgery

## 2024-01-17 ENCOUNTER — Ambulatory Visit: Admitting: Orthopedic Surgery

## 2024-01-17 ENCOUNTER — Encounter: Payer: Self-pay | Admitting: Orthopedic Surgery

## 2024-01-17 DIAGNOSIS — S91102D Unspecified open wound of left great toe without damage to nail, subsequent encounter: Secondary | ICD-10-CM | POA: Diagnosis not present

## 2024-01-17 DIAGNOSIS — S91109D Unspecified open wound of unspecified toe(s) without damage to nail, subsequent encounter: Secondary | ICD-10-CM

## 2024-01-17 NOTE — Progress Notes (Signed)
 Chief Complaint  Patient presents with   Foot Problem    Toe is better with antibiotics     Wound great toe  Encounter Diagnosis  Name Primary?   Open wound of toe, subsequent encounter Yes     Started on antibiotics Bactrim   Improved  Continue soap and water  wash triple antibiotic ointment Bactrim    Follow-up in a week

## 2024-01-18 ENCOUNTER — Other Ambulatory Visit: Payer: Self-pay | Admitting: Cardiology

## 2024-01-18 DIAGNOSIS — I4821 Permanent atrial fibrillation: Secondary | ICD-10-CM

## 2024-01-18 NOTE — Telephone Encounter (Signed)
 Prescription refill request for Eliquis  received. Indication:afib Last office visit:5/25 Scr:0.77  3/25 Age: 68 Weight:103.4  kg  Prescription refilled

## 2024-01-20 DIAGNOSIS — S52502D Unspecified fracture of the lower end of left radius, subsequent encounter for closed fracture with routine healing: Secondary | ICD-10-CM | POA: Diagnosis not present

## 2024-01-24 ENCOUNTER — Ambulatory Visit: Admitting: Orthopedic Surgery

## 2024-01-24 ENCOUNTER — Encounter: Payer: Self-pay | Admitting: Orthopedic Surgery

## 2024-01-24 DIAGNOSIS — S91109D Unspecified open wound of unspecified toe(s) without damage to nail, subsequent encounter: Secondary | ICD-10-CM

## 2024-01-24 DIAGNOSIS — S91102D Unspecified open wound of left great toe without damage to nail, subsequent encounter: Secondary | ICD-10-CM

## 2024-01-24 NOTE — Progress Notes (Signed)
   There were no vitals taken for this visit.  There is no height or weight on file to calculate BMI.  Chief Complaint  Patient presents with   Foot Problem    Left great/ patient states better     No diagnosis found.  DOI/DOS/ Date:    Improved

## 2024-01-24 NOTE — Progress Notes (Signed)
   There were no vitals taken for this visit.  There is no height or weight on file to calculate BMI.  Chief Complaint  Patient presents with   Foot Problem    Left great/ patient states better     Encounter Diagnoses  Name Primary?   Open wound of toe, subsequent encounter Yes   Open wound of left great toe, subsequent encounter     DOI/DOS/ Date:    Check wound.  Wound healed antibiotics finished patient can resume all normal activities follow-up as needed

## 2024-01-25 DIAGNOSIS — M5451 Vertebrogenic low back pain: Secondary | ICD-10-CM | POA: Diagnosis not present

## 2024-01-25 DIAGNOSIS — I110 Hypertensive heart disease with heart failure: Secondary | ICD-10-CM | POA: Diagnosis not present

## 2024-01-25 DIAGNOSIS — M109 Gout, unspecified: Secondary | ICD-10-CM | POA: Diagnosis not present

## 2024-01-25 DIAGNOSIS — E782 Mixed hyperlipidemia: Secondary | ICD-10-CM | POA: Diagnosis not present

## 2024-01-25 DIAGNOSIS — G4733 Obstructive sleep apnea (adult) (pediatric): Secondary | ICD-10-CM | POA: Diagnosis not present

## 2024-01-25 DIAGNOSIS — I2510 Atherosclerotic heart disease of native coronary artery without angina pectoris: Secondary | ICD-10-CM | POA: Diagnosis not present

## 2024-01-25 DIAGNOSIS — J302 Other seasonal allergic rhinitis: Secondary | ICD-10-CM | POA: Diagnosis not present

## 2024-01-25 DIAGNOSIS — I4891 Unspecified atrial fibrillation: Secondary | ICD-10-CM | POA: Diagnosis not present

## 2024-01-25 DIAGNOSIS — K219 Gastro-esophageal reflux disease without esophagitis: Secondary | ICD-10-CM | POA: Diagnosis not present

## 2024-01-25 DIAGNOSIS — D509 Iron deficiency anemia, unspecified: Secondary | ICD-10-CM | POA: Diagnosis not present

## 2024-01-27 DIAGNOSIS — H47011 Ischemic optic neuropathy, right eye: Secondary | ICD-10-CM | POA: Diagnosis not present

## 2024-01-31 ENCOUNTER — Other Ambulatory Visit: Payer: Self-pay | Admitting: Cardiology

## 2024-02-01 DIAGNOSIS — M9905 Segmental and somatic dysfunction of pelvic region: Secondary | ICD-10-CM | POA: Diagnosis not present

## 2024-02-01 DIAGNOSIS — M9903 Segmental and somatic dysfunction of lumbar region: Secondary | ICD-10-CM | POA: Diagnosis not present

## 2024-02-01 DIAGNOSIS — M9902 Segmental and somatic dysfunction of thoracic region: Secondary | ICD-10-CM | POA: Diagnosis not present

## 2024-02-01 DIAGNOSIS — M9906 Segmental and somatic dysfunction of lower extremity: Secondary | ICD-10-CM | POA: Diagnosis not present

## 2024-02-01 DIAGNOSIS — M62838 Other muscle spasm: Secondary | ICD-10-CM | POA: Diagnosis not present

## 2024-02-01 DIAGNOSIS — M9901 Segmental and somatic dysfunction of cervical region: Secondary | ICD-10-CM | POA: Diagnosis not present

## 2024-02-07 DIAGNOSIS — E782 Mixed hyperlipidemia: Secondary | ICD-10-CM | POA: Diagnosis not present

## 2024-02-07 DIAGNOSIS — I110 Hypertensive heart disease with heart failure: Secondary | ICD-10-CM | POA: Diagnosis not present

## 2024-02-07 DIAGNOSIS — K219 Gastro-esophageal reflux disease without esophagitis: Secondary | ICD-10-CM | POA: Diagnosis not present

## 2024-02-08 ENCOUNTER — Telehealth (HOSPITAL_BASED_OUTPATIENT_CLINIC_OR_DEPARTMENT_OTHER): Payer: Self-pay

## 2024-02-08 DIAGNOSIS — G4733 Obstructive sleep apnea (adult) (pediatric): Secondary | ICD-10-CM

## 2024-02-08 NOTE — Telephone Encounter (Signed)
 Copied from CRM #[id number]. Topic: General - Other >> Feb 08, 2024  1:41 PM Essie A wrote: Reason for CRM: Patient would like to try a different CPAP.  Please return his call at [phone].

## 2024-02-08 NOTE — Telephone Encounter (Signed)
 Mask fit ordered

## 2024-02-10 ENCOUNTER — Ambulatory Visit: Attending: Nurse Practitioner | Admitting: Nurse Practitioner

## 2024-02-10 ENCOUNTER — Encounter: Payer: Self-pay | Admitting: Nurse Practitioner

## 2024-02-10 ENCOUNTER — Telehealth: Payer: Self-pay | Admitting: Nurse Practitioner

## 2024-02-10 VITALS — BP 114/70 | HR 64 | Ht 66.0 in | Wt 226.4 lb

## 2024-02-10 DIAGNOSIS — R0989 Other specified symptoms and signs involving the circulatory and respiratory systems: Secondary | ICD-10-CM | POA: Diagnosis not present

## 2024-02-10 DIAGNOSIS — I77810 Thoracic aortic ectasia: Secondary | ICD-10-CM

## 2024-02-10 DIAGNOSIS — I255 Ischemic cardiomyopathy: Secondary | ICD-10-CM | POA: Diagnosis not present

## 2024-02-10 DIAGNOSIS — I4821 Permanent atrial fibrillation: Secondary | ICD-10-CM

## 2024-02-10 DIAGNOSIS — E785 Hyperlipidemia, unspecified: Secondary | ICD-10-CM | POA: Diagnosis not present

## 2024-02-10 DIAGNOSIS — Z136 Encounter for screening for cardiovascular disorders: Secondary | ICD-10-CM

## 2024-02-10 DIAGNOSIS — E669 Obesity, unspecified: Secondary | ICD-10-CM

## 2024-02-10 DIAGNOSIS — Z8679 Personal history of other diseases of the circulatory system: Secondary | ICD-10-CM

## 2024-02-10 DIAGNOSIS — I10 Essential (primary) hypertension: Secondary | ICD-10-CM

## 2024-02-10 DIAGNOSIS — I2510 Atherosclerotic heart disease of native coronary artery without angina pectoris: Secondary | ICD-10-CM | POA: Diagnosis not present

## 2024-02-10 DIAGNOSIS — I77819 Aortic ectasia, unspecified site: Secondary | ICD-10-CM

## 2024-02-10 NOTE — Telephone Encounter (Signed)
 Checking percert on the following patient for testing scheduled at [facility].    - CT ANGIO CHEST AORTA  W/CM&OR     03-03-2024

## 2024-02-10 NOTE — Progress Notes (Signed)
 Cardiology Office Note:  .   Date:  02/10/2024 ID:  Martin [name], DOB [birth year], MRN [id number] PCP: Shona Norleen PEDLAR, MD  [facility] Providers Cardiologist:  Jayson Sierras, MD Electrophysiologist:  Danelle Birmingham, MD    History of Present Illness: .   Martin [name] is a 68 y.o. male with a PMH of CAD, ischemic cardiomyopathy, permanent A-fib, HTN, hx of CVA, mild aortic valve stenosis, hx of symptomatic bradycardia, and mixed HLD, who presents today for evaluation for aortic dilatation.  Hx of angioplasty of LAD in 2001, stent interventions to RCA and LAD in 2011, and underwent CABG in 2020 (SVG - ramus intermedius, LIMA-LAD, SVG-PDA).   Last seen by Dr. Sierras on December 08, 2022. Was overall doing well at the time.   06/15/2023 - Today he presents for 6 month follow-up. He states he is doing very well and denies any acute cardiac complaints or concerns today.  He says he does cardio about 3 days/week, planning to become more active this year. Denies any chest pain, shortness of breath, palpitations, syncope, presyncope, dizziness, orthopnea, PND, swelling or significant weight changes, acute bleeding, or claudication.  In the interim, he has had a few ED visits and recent hospital stay. Hospitalized in March 2025 d/t acute on chronic diastolic CHF.  Prior to hospital stay, patient had fall and landed on the left arm, was noted to have a closed left humeral fracture and was instructed to follow-up with Ortho outpatient.   I last saw him in May 2025.  He was doing well at that time.    02/10/2024-patient is here because he tells me he had recent x-rays performed and was told by provider he needs to see his cardiologist due to what sounds like aortic dilatation noted on imaging.  No family history of aneurysms.  He believes he was told he had a thoracic aortic aneurysm.  He is a former smoker.  Otherwise he is doing well today.   ROS: Negative. See HPI.   Studies Reviewed: SABRA     EKG: EKG is not ordered today.  Echo 08/2023:  1. Left ventricular ejection fraction, by estimation, is 50 to 55%. The  left ventricle has low normal function. The left ventricle demonstrates  global hypokinesis. There is mild concentric left ventricular hypertrophy.  Left ventricular diastolic  parameters are indeterminate.   2. Right ventricular systolic function is normal. The right ventricular  size is normal. Tricuspid regurgitation signal is inadequate for assessing  PA pressure.   3. Left atrial size was severely dilated.   4. Right atrial size was severely dilated.   5. The mitral valve is normal in structure. No evidence of mitral valve  regurgitation. No evidence of mitral stenosis.   6. The aortic valve is normal in structure. Aortic valve regurgitation is  not visualized. No aortic stenosis is present.   7. The inferior vena cava is normal in size with greater than 50%  respiratory variability, suggesting right atrial pressure of 3 mmHg.   Echo 12/2022:   1. Left ventricular ejection fraction, by estimation, is 50 to 55%. The  left ventricle has low normal function. Left ventricular endocardial  border not optimally defined to evaluate regional wall motion. The left ventricular internal cavity size was mildly dilated. There is mild concentric left ventricular hypertrophy. Left ventricular diastolic parameters are indeterminate.   2. Right ventricular systolic function is normal. The right ventricular size is mildly enlarged. Tricuspid regurgitation signal is inadequate for  assessing PA pressure.   3. Left atrial size was moderately dilated.   4. Right atrial size was moderately dilated.   5. The mitral valve is grossly normal. Trivial mitral valve  regurgitation.   6. The aortic valve is tricuspid. There is mild calcification of the  aortic valve. Aortic valve regurgitation is not visualized. Mild aortic  valve stenosis. Aortic valve area, by VTI measures 1.49 cm. Aortic  valve mean gradient measures 13.0 mmHg.   7. The inferior vena cava is normal in size with greater than 50%  respiratory variability, suggesting right atrial pressure of 3 mmHg.   Comparison(s): Prior images reviewed side by side. LVEF low normal at 50-55% at this time, difficult to assess focal wall motion. Mild calcific aortic stenosis with stable mean gradient.  Lexiscan  11/2021:    Findings are consistent with prior myocardial infarction. The study is low risk.   No ST deviation was noted.   LV perfusion is abnormal. There is no evidence of ischemia. There is evidence of infarction. Defect 1: There is a small defect with mild reduction in uptake present in the apical to basal inferior location(s) that is fixed. There is abnormal wall motion in the defect area. Consistent with infarction.   Left ventricular function is abnormal. Global function is moderately reduced. End diastolic cavity size is normal. End systolic cavity size is normal.   Small inferior wall infarct from apex to base no ischemia EF estimated at 42% but patient in afib   LHC 02/2019:  Severe three-vessel coronary calcification Patent left main Eccentric ostial 50 to 75% LAD with mid vessel 40 to 50% narrowing.  Mid to distal LAD stent is widely patent. Ramus intermedius contains proximal 90% stenosis. Circumflex contains 99% proximal/ostial stenosis. RCA is heavily calcified and stented.  The artery is totally occluded in the proximal to mid segment.  Left-to-right collaterals are noted. Mildly reduced LV systolic function with EF estimated to be 40%.  LVEDP is 28 mmHg.  Findings compatible with acute on chronic combined systolic and diastolic heart failure.   RECOMMENDATIONS:   IV nitroglycerin  is added to help control the patient's blood pressure and lower LVEDP. Low-dose beta-blocker therapy in the form of metoprolol  25 mg twice daily is added.  I have held diltiazem  given LV function. IV heparin  will be  resumed. Surgical consultation is requested.      Physical Exam:   VS:  BP 114/70   Pulse 64   Ht 5' 6 (1.676 m)   Wt 226 lb 6.4 oz (102.7 kg)   SpO2 97%   BMI 36.54 kg/m    Wt Readings from Last 3 Encounters:  02/10/24 226 lb 6.4 oz (102.7 kg)  11/16/23 228 lb (103.4 kg)  11/05/23 231 lb (104.8 kg)    GEN: Obese, 68 year old male in no acute distress NECK: No JVD; mild bilateral carotid bruits CARDIAC: S1/S2, irregularly irregular rhythm, no murmurs, rubs, gallops RESPIRATORY:  Clear to auscultation without rales, wheezing or rhonchi  ABDOMEN: Soft, non-tender, non-distended EXTREMITIES:  No edema; No deformity   ASSESSMENT AND PLAN: .    CAD, s/p CABG in 2020 Stable with no anginal symptoms. No indication for ischemic evaluation.  Not on aspirin  due to being on Eliquis .  Continue atorvastatin  and nitroglycerin  as needed. Heart healthy diet and regular cardiovascular exercise encouraged.   2. Permanent A-fib Denies any tachycardia or palpitations.  Heart rate is well-controlled, has not required AV nodal blockers.  Previous history of symptomatic bradycardia.  Continue  Eliquis  for stroke prevention, denies any bleeding issues.  He is on appropriate dosage. No medication changes at this time.   3. Ischemic cardiomyopathy Stage C, NYHA class I symptoms. EF 50 to 55%.  Euvolemic well compensated on exam.  Continue current medication regimen. Low sodium diet, fluid restriction <2L, and daily weights encouraged. Educated to contact our office for weight gain of 2 lbs overnight or 5 lbs in one week.  4. HTN BP stable and at goal. Discussed to monitor BP at home at least 2 hours after medications and sitting for 5-10 minutes.  No medication changes at this time. Heart healthy diet and regular cardiovascular exercise encouraged.   5. HLD Labs faxed from PCPs office showed LDL of 38. Discussed LDL goal is less than 55. No medication changes at this time. Heart healthy diet and regular  cardiovascular exercise encouraged.   6. Obesity Weight loss via diet and exercise encouraged. Discussed the impact being overweight would have on cardiovascular risk.  7. Hx of aortic valve stenosis Noted on echocardiogram in July 2024, mean gradient 13.0 mmHg.  He is asymptomatic with this.  Most recent echocardiogram in March 2025 did not show evidence of aortic valve stenosis.  Will cancel upcoming echocardiogram scheduled for July 2025.  8. Aortic dilatation, screening for AAA, carotid bruits Recent x-ray imaging showed what sounds like thoracic aortic aneurysm noted by another provider.  Will arrange CTA of his aorta to check for thoracic aortic aneurysm and he meets USPSTF guidelines for AAA screening, will arrange.  Will obtain BMET prior to CT of his chest.  Will also arrange carotid duplex as mild bilateral carotid bruits are noted on exam today.  Dispo: Follow-up with MD/APP in 6 months or sooner if anything changes.  Signed, Almarie Crate, NP

## 2024-02-10 NOTE — Patient Instructions (Addendum)
 Medication Instructions:  Your physician recommends that you continue on your current medications as directed. Please refer to the Current Medication list given to you today.  Labwork: Before your testing at [facility]   Testing/Procedures: Non-Cardiac CT scanning, (CAT scanning), is a noninvasive, special x-ray that produces cross-sectional images of the body using x-rays and a computer. CT scans help physicians diagnose and treat medical conditions. For some CT exams, a contrast material is used to enhance visibility in the area of the body being studied. CT scans provide greater clarity and reveal more details than regular x-ray exams. Your physician has requested that you have a carotid duplex. This test is an ultrasound of the carotid arteries in your neck. It looks at blood flow through these arteries that supply the brain with blood. Allow one hour for this exam. There are no restrictions or special instructions. Your physician has requested that you have an abdominal aorta duplex. During this test, an ultrasound is used to evaluate the aorta. Allow 30 minutes for this exam. Do not eat after midnight the day before and avoid carbonated beverages.  Please note: We ask at that you not bring children with you during ultrasound (echo/ vascular) testing. Due to room size and safety concerns, children are not allowed in the ultrasound rooms during exams. Our front office staff cannot provide observation of children in our lobby area while testing is being conducted. An adult accompanying a patient to their appointment will only be allowed in the ultrasound room at the discretion of the ultrasound technician under special circumstances. We apologize for any inconvenience.  Follow-Up: Your physician recommends that you schedule a follow-up appointment in: 6 months   Any Other Special Instructions Will Be Listed Below (If Applicable).  If you need a refill on your cardiac medications before your next  appointment, please call your pharmacy.

## 2024-02-11 DIAGNOSIS — R7301 Impaired fasting glucose: Secondary | ICD-10-CM | POA: Diagnosis not present

## 2024-02-15 DIAGNOSIS — I110 Hypertensive heart disease with heart failure: Secondary | ICD-10-CM | POA: Diagnosis not present

## 2024-02-15 DIAGNOSIS — I4891 Unspecified atrial fibrillation: Secondary | ICD-10-CM | POA: Diagnosis not present

## 2024-02-15 DIAGNOSIS — M109 Gout, unspecified: Secondary | ICD-10-CM | POA: Diagnosis not present

## 2024-02-15 DIAGNOSIS — I2510 Atherosclerotic heart disease of native coronary artery without angina pectoris: Secondary | ICD-10-CM | POA: Diagnosis not present

## 2024-02-15 DIAGNOSIS — M5451 Vertebrogenic low back pain: Secondary | ICD-10-CM | POA: Diagnosis not present

## 2024-02-15 DIAGNOSIS — J302 Other seasonal allergic rhinitis: Secondary | ICD-10-CM | POA: Diagnosis not present

## 2024-02-15 DIAGNOSIS — G4733 Obstructive sleep apnea (adult) (pediatric): Secondary | ICD-10-CM | POA: Diagnosis not present

## 2024-02-15 DIAGNOSIS — K219 Gastro-esophageal reflux disease without esophagitis: Secondary | ICD-10-CM | POA: Diagnosis not present

## 2024-02-15 DIAGNOSIS — E782 Mixed hyperlipidemia: Secondary | ICD-10-CM | POA: Diagnosis not present

## 2024-02-15 DIAGNOSIS — D509 Iron deficiency anemia, unspecified: Secondary | ICD-10-CM | POA: Diagnosis not present

## 2024-02-15 NOTE — Telephone Encounter (Signed)
 dr jude and he is having issues with his cpap machine mask fitting . Gave the lady the information to the referral for a new mask

## 2024-02-17 DIAGNOSIS — I6529 Occlusion and stenosis of unspecified carotid artery: Secondary | ICD-10-CM | POA: Diagnosis not present

## 2024-02-17 DIAGNOSIS — I110 Hypertensive heart disease with heart failure: Secondary | ICD-10-CM | POA: Diagnosis not present

## 2024-02-17 DIAGNOSIS — I509 Heart failure, unspecified: Secondary | ICD-10-CM | POA: Diagnosis not present

## 2024-02-17 DIAGNOSIS — I4891 Unspecified atrial fibrillation: Secondary | ICD-10-CM | POA: Diagnosis not present

## 2024-02-17 DIAGNOSIS — Z23 Encounter for immunization: Secondary | ICD-10-CM | POA: Diagnosis not present

## 2024-02-17 DIAGNOSIS — Z8709 Personal history of other diseases of the respiratory system: Secondary | ICD-10-CM | POA: Diagnosis not present

## 2024-02-17 DIAGNOSIS — G4733 Obstructive sleep apnea (adult) (pediatric): Secondary | ICD-10-CM | POA: Diagnosis not present

## 2024-02-17 DIAGNOSIS — I2510 Atherosclerotic heart disease of native coronary artery without angina pectoris: Secondary | ICD-10-CM | POA: Diagnosis not present

## 2024-02-17 DIAGNOSIS — D509 Iron deficiency anemia, unspecified: Secondary | ICD-10-CM | POA: Diagnosis not present

## 2024-02-17 DIAGNOSIS — M5451 Vertebrogenic low back pain: Secondary | ICD-10-CM | POA: Diagnosis not present

## 2024-02-17 DIAGNOSIS — G894 Chronic pain syndrome: Secondary | ICD-10-CM | POA: Diagnosis not present

## 2024-02-17 DIAGNOSIS — I10 Essential (primary) hypertension: Secondary | ICD-10-CM | POA: Diagnosis not present

## 2024-02-23 ENCOUNTER — Encounter: Payer: Self-pay | Admitting: Nurse Practitioner

## 2024-02-23 ENCOUNTER — Telehealth: Payer: Self-pay | Admitting: *Deleted

## 2024-02-23 DIAGNOSIS — I77810 Thoracic aortic ectasia: Secondary | ICD-10-CM

## 2024-02-23 NOTE — Telephone Encounter (Signed)
 Made patient aware order was placed for mask fitting. On 02/10/24. He says he hasn't heard anything. Advise to give [employer] a call. NFN

## 2024-02-25 NOTE — Telephone Encounter (Signed)
 Copied from CRM [id number]. Topic: Clinical - Order For Equipment >> Feb 22, 2024 12:19 PM Devaughn RAMAN wrote: Reason for CRM: Patient called in regarding CPAP mask. Patient would like a new CPAP mask, he is having a hard time with the mask and he stated he would like to switch over to the pillows.  Nothing further needed.

## 2024-03-01 ENCOUNTER — Ambulatory Visit: Attending: Nurse Practitioner

## 2024-03-01 ENCOUNTER — Ambulatory Visit (INDEPENDENT_AMBULATORY_CARE_PROVIDER_SITE_OTHER)

## 2024-03-01 DIAGNOSIS — I77819 Aortic ectasia, unspecified site: Secondary | ICD-10-CM | POA: Diagnosis not present

## 2024-03-01 DIAGNOSIS — R0989 Other specified symptoms and signs involving the circulatory and respiratory systems: Secondary | ICD-10-CM

## 2024-03-01 DIAGNOSIS — Z136 Encounter for screening for cardiovascular disorders: Secondary | ICD-10-CM

## 2024-03-02 ENCOUNTER — Telehealth: Payer: Self-pay | Admitting: *Deleted

## 2024-03-02 NOTE — Telephone Encounter (Signed)
 Received CMN from [facility] regarding CPAP supplies.  Placed in CMN folder for signature.

## 2024-03-03 ENCOUNTER — Ambulatory Visit (HOSPITAL_COMMUNITY)
Admission: RE | Admit: 2024-03-03 | Discharge: 2024-03-03 | Disposition: A | Discharge status: Home / Self Care | Source: Ambulatory Visit | Attending: Nurse Practitioner | Admitting: Nurse Practitioner

## 2024-03-03 DIAGNOSIS — I77819 Aortic ectasia, unspecified site: Secondary | ICD-10-CM | POA: Diagnosis not present

## 2024-03-03 DIAGNOSIS — I2510 Atherosclerotic heart disease of native coronary artery without angina pectoris: Secondary | ICD-10-CM | POA: Diagnosis not present

## 2024-03-03 DIAGNOSIS — I7121 Aneurysm of the ascending aorta, without rupture: Secondary | ICD-10-CM | POA: Diagnosis not present

## 2024-03-03 MED ORDER — IOHEXOL 350 MG/ML SOLN
80.0000 mL | Freq: Once | INTRAVENOUS | Status: AC | PRN
  Administered 2024-03-03: 75 mL via INTRAVENOUS

## 2024-03-07 ENCOUNTER — Other Ambulatory Visit: Payer: Self-pay | Admitting: Cardiology

## 2024-03-07 DIAGNOSIS — G4733 Obstructive sleep apnea (adult) (pediatric): Secondary | ICD-10-CM | POA: Diagnosis not present

## 2024-03-07 DIAGNOSIS — I110 Hypertensive heart disease with heart failure: Secondary | ICD-10-CM | POA: Diagnosis not present

## 2024-03-07 DIAGNOSIS — K219 Gastro-esophageal reflux disease without esophagitis: Secondary | ICD-10-CM | POA: Diagnosis not present

## 2024-03-07 DIAGNOSIS — I4891 Unspecified atrial fibrillation: Secondary | ICD-10-CM | POA: Diagnosis not present

## 2024-03-07 DIAGNOSIS — M5451 Vertebrogenic low back pain: Secondary | ICD-10-CM | POA: Diagnosis not present

## 2024-03-07 DIAGNOSIS — D509 Iron deficiency anemia, unspecified: Secondary | ICD-10-CM | POA: Diagnosis not present

## 2024-03-07 DIAGNOSIS — E782 Mixed hyperlipidemia: Secondary | ICD-10-CM | POA: Diagnosis not present

## 2024-03-07 DIAGNOSIS — I2510 Atherosclerotic heart disease of native coronary artery without angina pectoris: Secondary | ICD-10-CM | POA: Diagnosis not present

## 2024-03-07 DIAGNOSIS — M109 Gout, unspecified: Secondary | ICD-10-CM | POA: Diagnosis not present

## 2024-03-07 DIAGNOSIS — J302 Other seasonal allergic rhinitis: Secondary | ICD-10-CM | POA: Diagnosis not present

## 2024-03-13 ENCOUNTER — Ambulatory Visit: Payer: Self-pay | Admitting: Nurse Practitioner

## 2024-03-14 ENCOUNTER — Encounter (HOSPITAL_COMMUNITY): Payer: Self-pay

## 2024-03-14 ENCOUNTER — Emergency Department (HOSPITAL_COMMUNITY)
Admission: EM | Admit: 2024-03-14 | Discharge: 2024-03-14 | Disposition: A | Discharge status: Home / Self Care | Attending: Emergency Medicine | Admitting: Emergency Medicine

## 2024-03-14 ENCOUNTER — Emergency Department (HOSPITAL_COMMUNITY)

## 2024-03-14 ENCOUNTER — Other Ambulatory Visit: Payer: Self-pay

## 2024-03-14 DIAGNOSIS — M7989 Other specified soft tissue disorders: Secondary | ICD-10-CM | POA: Diagnosis not present

## 2024-03-14 DIAGNOSIS — I4891 Unspecified atrial fibrillation: Secondary | ICD-10-CM | POA: Diagnosis not present

## 2024-03-14 DIAGNOSIS — M19031 Primary osteoarthritis, right wrist: Secondary | ICD-10-CM | POA: Diagnosis not present

## 2024-03-14 DIAGNOSIS — S63501A Unspecified sprain of right wrist, initial encounter: Secondary | ICD-10-CM | POA: Insufficient documentation

## 2024-03-14 DIAGNOSIS — S6991XA Unspecified injury of right wrist, hand and finger(s), initial encounter: Secondary | ICD-10-CM | POA: Diagnosis present

## 2024-03-14 DIAGNOSIS — S80811A Abrasion, right lower leg, initial encounter: Secondary | ICD-10-CM

## 2024-03-14 DIAGNOSIS — Y92009 Unspecified place in unspecified non-institutional (private) residence as the place of occurrence of the external cause: Secondary | ICD-10-CM | POA: Insufficient documentation

## 2024-03-14 DIAGNOSIS — Z7901 Long term (current) use of anticoagulants: Secondary | ICD-10-CM | POA: Diagnosis not present

## 2024-03-14 DIAGNOSIS — Y9301 Activity, walking, marching and hiking: Secondary | ICD-10-CM | POA: Insufficient documentation

## 2024-03-14 DIAGNOSIS — I2510 Atherosclerotic heart disease of native coronary artery without angina pectoris: Secondary | ICD-10-CM | POA: Insufficient documentation

## 2024-03-14 DIAGNOSIS — S63614A Unspecified sprain of right ring finger, initial encounter: Secondary | ICD-10-CM | POA: Diagnosis not present

## 2024-03-14 DIAGNOSIS — S0990XA Unspecified injury of head, initial encounter: Secondary | ICD-10-CM | POA: Diagnosis not present

## 2024-03-14 DIAGNOSIS — M79641 Pain in right hand: Secondary | ICD-10-CM | POA: Diagnosis not present

## 2024-03-14 DIAGNOSIS — W19XXXA Unspecified fall, initial encounter: Secondary | ICD-10-CM | POA: Insufficient documentation

## 2024-03-14 DIAGNOSIS — R9082 White matter disease, unspecified: Secondary | ICD-10-CM | POA: Diagnosis not present

## 2024-03-14 MED ORDER — DICLOFENAC SODIUM 1 % EX GEL: 4.0000 g | Freq: Four times a day (QID) | CUTANEOUS | 0 refills | Status: DC | PRN

## 2024-03-14 MED ORDER — OXYCODONE HCL 5 MG PO TABS
5.0000 mg | ORAL_TABLET | Freq: Once | ORAL | Status: AC
  Administered 2024-03-14: 5 mg via ORAL
  Filled 2024-03-14: qty 1

## 2024-03-14 MED ORDER — HYDROCODONE-ACETAMINOPHEN 5-325 MG PO TABS
1.0000 | ORAL_TABLET | Freq: Once | ORAL | Status: AC
  Administered 2024-03-14: 1 via ORAL
  Filled 2024-03-14: qty 1

## 2024-03-14 NOTE — Discharge Instructions (Addendum)
 It was a pleasure taking care of you today.  You were seen in the ER evaluation of injuries after fall.  You had swelling and pain to your right wrist and your ring finger.  Fortunately your x-rays do not show an acute fracture or dislocation.  You do have degenerative changes.  Your symptoms today are likely due to a sprain.  We are going to give you a brace for your wrist to help support it, keep it elevated, you can use ice for 15 minutes at a time several times a day and limit the use.  Follow-up closely with your PCP and/orthopedics.  You can keep your fingers buddy taped for the sprain as needed for comfort.  We also ordered a CT scan of your head since you are on blood thinners and this did not show any sign of bleeding in your brain or skull fracture.  Please come back to the ER if you have any new or worsening symptoms.

## 2024-03-14 NOTE — ED Triage Notes (Addendum)
 Patient come in POV from home for complaint of fall yesterday and hurt his right wrist. Swelling noted to wrist. Patient on eliquis . Patient stated he does not believe he hit his head did stated he hit his shoulder and elbow on right side.

## 2024-03-14 NOTE — ED Provider Notes (Signed)
 [place] [facility]ERGENCY DEPARTMENT AT [facility] Provider Note   CSN: [id number] Arrival date & time: 03/14/24  9098     Patient presents with: Fall and Wrist Pain   Martin [name] is a 68 y.o. male.  He has a history of CAD, sleep apnea, A-fib on Eliquis , CVA.  Dents ER today complaining of right wrist pain and swelling after fall yesterday.  He states he was walking into his house and tripped falling on outstretched right hand and elbow, he does not know if he hit his head or not but denies loss of consciousness or headache, no numbness tingling or weakness, denies getting dizzy prior to fall, no chest pain or shortness of breath.    Fall  Wrist Pain       Prior to Admission medications   Medication Sig Start Date End Date Taking? Authorizing Provider  acetaminophen  (TYLENOL ) 500 MG tablet Take 2 tablets (1,000 mg total) by mouth every 8 (eight) hours. 08/23/23   Shahmehdi, Adriana LABOR, MD  allopurinol  (ZYLOPRIM ) 300 MG tablet Take 300 mg by mouth daily.     [provider]  atorvastatin  (LIPITOR ) 80 MG tablet TAKE ONE TABLET BY MOUTH EVERY DAY 02/01/24   Debera Jayson MATSU, MD  Cholecalciferol (VITAMIN D-3) 1000 UNITS CAPS Take 1,000 Units by mouth every morning.     [provider]  Cyanocobalamin  (VITAMIN B-12) 2500 MCG SUBL Place 2,500 mcg under the tongue every morning.     [provider]  diclofenac  Sodium (VOLTAREN ) 1 % GEL Apply 4 g topically 4 (four) times daily as needed (Pain). 03/14/24   Suellen Cantor A, PA-C  docusate sodium  (COLACE) 100 MG capsule Take 2 capsules (200 mg total) by mouth daily. Patient taking differently: Take 100 mg by mouth at bedtime as needed. 04/11/19   Love, Sharlet RAMAN, PA-C  ELIQUIS  5 MG TABS tablet TAKE ONE TABLET BY MOUTH TWICE DAILY 01/18/24   Debera Jayson MATSU, MD  escitalopram (LEXAPRO) 10 MG tablet Take 10 mg by mouth daily. 08/04/23   [provider]  fluticasone  (FLONASE ) 50 MCG/ACT nasal spray  Place 2 sprays into the nose daily.    [provider]  folic acid  (FOLVITE ) 1 MG tablet TAKE ONE TABLET BY MOUTH EVERY DAY 02/07/12   de Gent, Guy E, MD  furosemide  (LASIX ) 40 MG tablet TAKE ONE TABLET BY MOUTH EVERY DAY 01/05/24   Debera Jayson MATSU, MD  HYDROcodone -acetaminophen  (NORCO/VICODIN) 5-325 MG tablet Take 1 tablet by mouth every 8 (eight) hours as needed for moderate pain (pain score 4-6). 09/20/23   Margrette Taft BRAVO, MD  loratadine  (CLARITIN ) 10 MG tablet Take 10 mg by mouth daily.    [provider]  LORazepam  (ATIVAN ) 0.5 MG tablet Take 0.5 mg by mouth at bedtime.  05/15/19   [provider]  meclizine  (ANTIVERT ) 32 MG tablet Take 32 mg by mouth daily as needed for dizziness.     [provider]  mupirocin  ointment (BACTROBAN ) 2 % Apply 1 Application topically 2 (two) times daily. 11/09/23   [provider]  nitroGLYCERIN  (NITROSTAT ) 0.4 MG SL tablet Place 1 tablet (0.4 mg total) under the tongue every 5 (five) minutes as needed for chest pain. 07/27/22   Debera Jayson MATSU, MD  pantoprazole  (PROTONIX ) 40 MG tablet Take 1 tablet (40 mg total) by mouth daily. 04/11/19   Love, Sharlet RAMAN, PA-C  polyethylene glycol (MIRALAX  / GLYCOLAX ) packet Take 17 g by mouth at bedtime as needed  for mild constipation.    [provider]  sacubitril -valsartan  (ENTRESTO ) 24-26 MG TAKE ONE TABLET BY MOUTH TWICE DAILY 03/08/24   Debera Jayson MATSU, MD  spironolactone  (ALDACTONE ) 25 MG tablet TAKE 1/2 TABLET BY MOUTH DAILY 08/18/23   Debera Jayson MATSU, MD  sulfamethoxazole -trimethoprim  (BACTRIM  DS) 800-160 MG tablet Take 1 tablet by mouth 2 (two) times daily. 01/10/24   Margrette Taft BRAVO, MD  tamsulosin  (FLOMAX ) 0.4 MG CAPS capsule Take 0.4 mg by mouth at bedtime.    [provider]    Allergies: Penicillins    Review of Systems  Updated Vital Signs BP 135/71   Pulse 66   Temp 97.7 F (36.5 C) (Oral)   Resp 17   Ht 5' 6 (1.676 m)   Wt 99.8  kg   SpO2 95%   BMI 35.51 kg/m   Physical Exam Vitals and nursing note reviewed.  Constitutional:      General: He is not in acute distress.    Appearance: He is well-developed.  HENT:     Head: Normocephalic and atraumatic.     Mouth/Throat:     Mouth: Mucous membranes are moist.  Eyes:     Extraocular Movements: Extraocular movements intact.     Conjunctiva/sclera: Conjunctivae normal.     Pupils: Pupils are equal, round, and reactive to light.  Cardiovascular:     Rate and Rhythm: Normal rate and regular rhythm.     Heart sounds: No murmur heard. Pulmonary:     Effort: Pulmonary effort is normal. No respiratory distress.     Breath sounds: Normal breath sounds.  Abdominal:     Palpations: Abdomen is soft.     Tenderness: There is no abdominal tenderness.  Musculoskeletal:        General: No swelling.     Cervical back: Neck supple.     Comments: There is diffuse swelling to the right wrist, with tenderness over the dorsum, there is no tenderness of the anatomic snuffbox.  Patient can flex and extend the wrist with minimal difficulty, he can radially and ulnarly deviate.  There is no crepitus, radial pulses intact.  There is also swelling noted to the ring finger of the right hand diffusely with normal range of motion and mild diffuse tenderness.  There are abrasions of the right elbow but no bony tenderness of the right forearm elbow or shoulder.  Normal range of motion of the right elbow and shoulder. There is no C-spine tenderness, normal range of motion of the neck without difficulty, no tenderness of midline thoracic or lumbar spine.  Skin:    General: Skin is warm and dry.     Capillary Refill: Capillary refill takes less than 2 seconds.     Comments: Fishel abrasions to right elbow, superficial skin avulsion to posterior left calf with no active bleeding.  Neurological:     General: No focal deficit present.     Mental Status: He is alert and oriented to person, place,  and time.  Psychiatric:        Mood and Affect: Mood normal.     (all labs ordered are listed, but only abnormal results are displayed) Labs Reviewed - No data to display  EKG: None  Radiology: DG Hand Complete Right Result Date: 03/14/2024 EXAM: 3 VIEW(S) XRAY OF THE RIGHT HAND 03/14/2024 10:00:00 AM COMPARISON: Right hand series 06/01/2014. CLINICAL HISTORY: 68 year old male. Fall yesterday with right wrist pain and swelling. On Eliquis . FINDINGS: BONES AND JOINTS: Progressed  right wrist degeneration, which is detailed on the wrist series separately today. Chronic severe second and third MCP degeneration and joint space loss progressed since Apr 18, 2014. Less pronounced degeneration at the other mcp joints. No obvious periarticular erosion in the hand. No superimposed acute fracture or dislocation identified. No focal osseous lesion. SOFT TISSUES: Soft tissue contours appear stable since 18-Apr-2014. IMPRESSION: 1. No acute fracture or dislocation identified. 2. Chronic severe second and third MCP joint degeneration with progression since 18-Apr-2014. 3. See also Right wrist reported separately. Electronically signed by: Helayne Hurst MD 03/14/2024 10:31 AM EDT RP Workstation: [id number]   DG Wrist Complete Right Result Date: 03/14/2024 EXAM: 4 VIEW(S) XRAY OF THE RIGHT WRIST 03/14/2024 10:00:00 AM COMPARISON: None available. CLINICAL HISTORY: 68 year old male. Fall yesterday and hurt his right wrist. Swelling noted to wrist. Patient on eliquis . FINDINGS: BONES AND JOINTS: Chronic severe degeneration at the right wrist including the radiocarpal joint chondrocalcinosis and multifocal periarticular erosions of the carpal bones. Ulnar styloid also partially eroded. No superimposed acute fracture or dislocation identified at the right wrist. SOFT TISSUES: Generalized soft tissue swelling. Calcified peripheral vascular disease. IMPRESSION: 1. No acute fracture or dislocation. 2. Chronic severe right wrist degeneration  suggestive of a chronic erosive arthropathy. 3. Generalized soft tissue swelling, nonspecific Electronically signed by: Helayne Hurst MD 03/14/2024 10:29 AM EDT RP Workstation: [id number]   CT Head Wo Contrast Result Date: 03/14/2024 EXAM: CT HEAD WITHOUT CONTRAST 03/14/2024 10:10:30 AM TECHNIQUE: CT of the head was performed without the administration of intravenous contrast. Automated exposure control, iterative reconstruction, and/or weight based adjustment of the mA/kV was utilized to reduce the radiation dose to as low as reasonably achievable. COMPARISON: Brain MRI 04/18/2008 and Head CT 05/30/2019. CLINICAL HISTORY: 68 year old male with minor head trauma after a fall yesterday, right wrist injury, and on Eliquis . FINDINGS: BRAIN AND VENTRICLES: No acute hemorrhage. No evidence of acute infarct. No hydrocephalus. No extra-axial collection. No mass effect or midline shift. Cerebral volume not significantly changed since 19-Apr-2019. Chronic Encephalomalacia in the posterior superior right frontal gyrus is stable, along with underlying bilateral superior frontal lobe white matter hypodensity. Extensive calcified atherosclerosis at the skull base. No suspicious intracranial vascular hyperdensity. ORBITS: No acute orbital abnormality. SINUSES: No acute abnormality. SOFT TISSUES AND SKULL: No acute soft tissue abnormality. No acute scalp soft tissue injury identified. No skull fracture. IMPRESSION: 1. No acute intracranial abnormality or acute traumatic injury identified. 2. Stable chronic white matter disease and posterior superior right frontal gyrus encephalomalacia. Electronically signed by: Helayne Hurst MD 03/14/2024 10:26 AM EDT RP Workstation: [id number]     Procedures   Medications Ordered in the ED  oxyCODONE  (Oxy IR/ROXICODONE ) immediate release tablet 5 mg (has no administration in time range)  HYDROcodone -acetaminophen  (NORCO/VICODIN) 5-325 MG per tablet 1 tablet (1 tablet Oral Given 03/14/24 0947)                                     Medical Decision Making Differential diagnosis includes but limited to fracture, sprain, strain, contusion, dislocation, other  Comorbidities affecting care: A-fib on Eliquis , CAD, prior stroke  ED course: Patient presents with diffuse swelling of the right wrist after fall yesterday.  No snuffbox tenderness, x-rays ordered, I independently interpreted these that show no fracture dislocation with degenerative changes noted, agree with radiology read, x-ray right hand independently interpreted by me shows no fracture or dislocation, I agree with radiology  read.  CT head independently interpreted by me, no intracranial hemorrhage or mass effect I agree with radiology read.  Patient given 5 mg hydrocodone  here with minimal relief, given additional dose of oxycodone .  Per record review he is on chronic hydrocodone  10 mg daily.  Patient states he has several days left at home and is going to refill his prescription today so will not provide further prescriptions.  I did prescribe a topical Voltaren  gel however.  Plan to put patient in a wrist brace and will buddy tape his middle finger to his ring finger for support for likely sprain and have him follow-up with sleeve with orthopedics.  He was given Norco for pain, given his comorbidities and anticoagulant use will avoid NSAIDs.  Patient reports that he tripped and fell and did not get dizzy, pass out, palpitations or other complaints necessitating any further workup.  He is advised on follow-up and strict return precautions.  No indication for repair of the left posterior leg wound with superficial soft tissue avulsion so willl have RN seen and dress this.  Amount and/or Complexity of Data Reviewed External Data Reviewed: notes. Radiology: ordered and independent interpretation performed. Decision-making details documented in ED Course.  Risk Prescription drug management.        Final diagnoses:  Sprain  of right wrist, unspecified location, initial encounter  Sprain of right ring finger, unspecified site of digit, initial encounter    ED Discharge Orders          Ordered    diclofenac  Sodium (VOLTAREN ) 1 % GEL  4 times daily PRN        03/14/24 1054               Suellen Sherran LABOR, PA-C 03/14/24 1104    Suzette Pac, MD 03/16/24 1211

## 2024-03-15 DIAGNOSIS — S63591A Other specified sprain of right wrist, initial encounter: Secondary | ICD-10-CM | POA: Diagnosis not present

## 2024-03-17 DIAGNOSIS — Z9181 History of falling: Secondary | ICD-10-CM | POA: Diagnosis not present

## 2024-03-17 DIAGNOSIS — I110 Hypertensive heart disease with heart failure: Secondary | ICD-10-CM | POA: Diagnosis not present

## 2024-03-17 DIAGNOSIS — I10 Essential (primary) hypertension: Secondary | ICD-10-CM | POA: Diagnosis not present

## 2024-03-17 DIAGNOSIS — S63501D Unspecified sprain of right wrist, subsequent encounter: Secondary | ICD-10-CM | POA: Diagnosis not present

## 2024-03-17 DIAGNOSIS — I4891 Unspecified atrial fibrillation: Secondary | ICD-10-CM | POA: Diagnosis not present

## 2024-03-17 DIAGNOSIS — G4733 Obstructive sleep apnea (adult) (pediatric): Secondary | ICD-10-CM | POA: Diagnosis not present

## 2024-03-17 DIAGNOSIS — D509 Iron deficiency anemia, unspecified: Secondary | ICD-10-CM | POA: Diagnosis not present

## 2024-03-17 DIAGNOSIS — S63614D Unspecified sprain of right ring finger, subsequent encounter: Secondary | ICD-10-CM | POA: Diagnosis not present

## 2024-03-17 DIAGNOSIS — S81812D Laceration without foreign body, left lower leg, subsequent encounter: Secondary | ICD-10-CM | POA: Diagnosis not present

## 2024-03-24 DIAGNOSIS — Z6836 Body mass index (BMI) 36.0-36.9, adult: Secondary | ICD-10-CM | POA: Diagnosis not present

## 2024-03-24 DIAGNOSIS — D6949 Other primary thrombocytopenia: Secondary | ICD-10-CM | POA: Diagnosis not present

## 2024-03-24 DIAGNOSIS — F320 Major depressive disorder, single episode, mild: Secondary | ICD-10-CM | POA: Diagnosis not present

## 2024-03-24 DIAGNOSIS — I4891 Unspecified atrial fibrillation: Secondary | ICD-10-CM | POA: Diagnosis not present

## 2024-03-24 DIAGNOSIS — E782 Mixed hyperlipidemia: Secondary | ICD-10-CM | POA: Diagnosis not present

## 2024-03-24 DIAGNOSIS — I110 Hypertensive heart disease with heart failure: Secondary | ICD-10-CM | POA: Diagnosis not present

## 2024-03-24 DIAGNOSIS — M109 Gout, unspecified: Secondary | ICD-10-CM | POA: Diagnosis not present

## 2024-03-24 DIAGNOSIS — I5042 Chronic combined systolic (congestive) and diastolic (congestive) heart failure: Secondary | ICD-10-CM | POA: Diagnosis not present

## 2024-03-28 DIAGNOSIS — I4891 Unspecified atrial fibrillation: Secondary | ICD-10-CM | POA: Diagnosis not present

## 2024-03-28 DIAGNOSIS — M109 Gout, unspecified: Secondary | ICD-10-CM | POA: Diagnosis not present

## 2024-03-28 DIAGNOSIS — I110 Hypertensive heart disease with heart failure: Secondary | ICD-10-CM | POA: Diagnosis not present

## 2024-03-28 DIAGNOSIS — E782 Mixed hyperlipidemia: Secondary | ICD-10-CM | POA: Diagnosis not present

## 2024-03-28 DIAGNOSIS — D6949 Other primary thrombocytopenia: Secondary | ICD-10-CM | POA: Diagnosis not present

## 2024-03-28 DIAGNOSIS — F320 Major depressive disorder, single episode, mild: Secondary | ICD-10-CM | POA: Diagnosis not present

## 2024-03-28 DIAGNOSIS — I5042 Chronic combined systolic (congestive) and diastolic (congestive) heart failure: Secondary | ICD-10-CM | POA: Diagnosis not present

## 2024-03-28 DIAGNOSIS — Z6836 Body mass index (BMI) 36.0-36.9, adult: Secondary | ICD-10-CM | POA: Diagnosis not present

## 2024-03-30 ENCOUNTER — Ambulatory Visit: Admitting: Orthopedic Surgery

## 2024-03-31 DIAGNOSIS — I5042 Chronic combined systolic (congestive) and diastolic (congestive) heart failure: Secondary | ICD-10-CM | POA: Diagnosis not present

## 2024-03-31 DIAGNOSIS — I110 Hypertensive heart disease with heart failure: Secondary | ICD-10-CM | POA: Diagnosis not present

## 2024-03-31 DIAGNOSIS — M109 Gout, unspecified: Secondary | ICD-10-CM | POA: Diagnosis not present

## 2024-03-31 DIAGNOSIS — D6949 Other primary thrombocytopenia: Secondary | ICD-10-CM | POA: Diagnosis not present

## 2024-03-31 DIAGNOSIS — I4891 Unspecified atrial fibrillation: Secondary | ICD-10-CM | POA: Diagnosis not present

## 2024-03-31 DIAGNOSIS — E782 Mixed hyperlipidemia: Secondary | ICD-10-CM | POA: Diagnosis not present

## 2024-03-31 DIAGNOSIS — F320 Major depressive disorder, single episode, mild: Secondary | ICD-10-CM | POA: Diagnosis not present

## 2024-03-31 DIAGNOSIS — Z6836 Body mass index (BMI) 36.0-36.9, adult: Secondary | ICD-10-CM | POA: Diagnosis not present

## 2024-04-04 DIAGNOSIS — F320 Major depressive disorder, single episode, mild: Secondary | ICD-10-CM | POA: Diagnosis not present

## 2024-04-04 DIAGNOSIS — M109 Gout, unspecified: Secondary | ICD-10-CM | POA: Diagnosis not present

## 2024-04-04 DIAGNOSIS — I110 Hypertensive heart disease with heart failure: Secondary | ICD-10-CM | POA: Diagnosis not present

## 2024-04-04 DIAGNOSIS — Z6836 Body mass index (BMI) 36.0-36.9, adult: Secondary | ICD-10-CM | POA: Diagnosis not present

## 2024-04-04 DIAGNOSIS — I4891 Unspecified atrial fibrillation: Secondary | ICD-10-CM | POA: Diagnosis not present

## 2024-04-04 DIAGNOSIS — I5042 Chronic combined systolic (congestive) and diastolic (congestive) heart failure: Secondary | ICD-10-CM | POA: Diagnosis not present

## 2024-04-04 DIAGNOSIS — E782 Mixed hyperlipidemia: Secondary | ICD-10-CM | POA: Diagnosis not present

## 2024-04-04 DIAGNOSIS — D6949 Other primary thrombocytopenia: Secondary | ICD-10-CM | POA: Diagnosis not present

## 2024-04-10 ENCOUNTER — Encounter: Admitting: Orthopedic Surgery

## 2024-04-12 DIAGNOSIS — F320 Major depressive disorder, single episode, mild: Secondary | ICD-10-CM | POA: Diagnosis not present

## 2024-04-12 DIAGNOSIS — M109 Gout, unspecified: Secondary | ICD-10-CM | POA: Diagnosis not present

## 2024-04-12 DIAGNOSIS — Z6836 Body mass index (BMI) 36.0-36.9, adult: Secondary | ICD-10-CM | POA: Diagnosis not present

## 2024-04-12 DIAGNOSIS — E782 Mixed hyperlipidemia: Secondary | ICD-10-CM | POA: Diagnosis not present

## 2024-04-12 DIAGNOSIS — D6949 Other primary thrombocytopenia: Secondary | ICD-10-CM | POA: Diagnosis not present

## 2024-04-12 DIAGNOSIS — I110 Hypertensive heart disease with heart failure: Secondary | ICD-10-CM | POA: Diagnosis not present

## 2024-04-12 DIAGNOSIS — I5042 Chronic combined systolic (congestive) and diastolic (congestive) heart failure: Secondary | ICD-10-CM | POA: Diagnosis not present

## 2024-04-12 DIAGNOSIS — I4891 Unspecified atrial fibrillation: Secondary | ICD-10-CM | POA: Diagnosis not present

## 2024-04-14 ENCOUNTER — Ambulatory Visit: Admitting: Orthopedic Surgery

## 2024-04-14 ENCOUNTER — Encounter: Payer: Self-pay | Admitting: Orthopedic Surgery

## 2024-04-14 VITALS — Wt 236.2 lb

## 2024-04-14 DIAGNOSIS — S63522A Sprain of radiocarpal joint of left wrist, initial encounter: Secondary | ICD-10-CM

## 2024-04-14 NOTE — Progress Notes (Signed)
 Chief Complaint  Patient presents with   Wrist Pain    Right wrist pain  ER visit    03/14/24 ER visit for right wrist pain   68 y.o. male.  He has a history of CAD, sleep apnea, A-fib on Eliquis , CVA.  Dents ER today complaining of right wrist pain and swelling after fall yesterday.   I reviewed the ER record x-rays were obtained wrist and hand  Image interpretations read by another doctor  Wrist x-ray shows chronic arthritis of the lunate looks especially atrophic  There are also x-ray of the hand which shows arthritis in the metacarpal phalangeal joints  Focused exam  Mild tenderness over the right wrist he does have functional range of motion his fingers are moving fine he is has some mild pain with range of motion of the wrist there are no neurovascular deficits no wounds  Encounter Diagnosis  Name Primary?   Sprain of left radiocarpal ligament, initial encounter Yes    Sprain wrist with underlying arthritis  Recommend continue pain medication and wrist support until symptoms resolve no follow-up needed

## 2024-04-14 NOTE — Patient Instructions (Signed)
 Wear brace and take hydrocodone  for pain until the symptoms calm down

## 2024-04-18 ENCOUNTER — Ambulatory Visit (HOSPITAL_COMMUNITY)

## 2024-04-20 ENCOUNTER — Encounter (HOSPITAL_COMMUNITY): Payer: Self-pay

## 2024-04-20 ENCOUNTER — Ambulatory Visit (HOSPITAL_COMMUNITY): Attending: Orthopedic Surgery

## 2024-04-20 DIAGNOSIS — Z9181 History of falling: Secondary | ICD-10-CM | POA: Diagnosis not present

## 2024-04-20 DIAGNOSIS — R2689 Other abnormalities of gait and mobility: Secondary | ICD-10-CM | POA: Diagnosis not present

## 2024-04-20 DIAGNOSIS — M6281 Muscle weakness (generalized): Secondary | ICD-10-CM | POA: Insufficient documentation

## 2024-04-20 NOTE — Therapy (Signed)
 OUTPATIENT PHYSICAL THERAPY NEURO EVALUATION   Patient Name: Martin [name] MRN: [id number] DOB:[birth year], 68 y.o., male Today's Date: 04/20/2024   PCP: Shona Norleen PEDLAR, MD  REFERRING PROVIDER: Shona Norleen PEDLAR, MD   END OF SESSION:  PT End of Session - 04/20/24 1105     Visit Number 1    Number of Visits 12    Date for Recertification  07/07/24    Authorization Type [employer]    Authorization Time Period Authorization requested    Progress Note Due on Visit 10    PT Start Time 1106    PT Stop Time 1147    PT Time Calculation (min) 41 min    Activity Tolerance Patient tolerated treatment well    Behavior During Therapy [facility] for tasks assessed/performed          Past Medical History:  Diagnosis Date   (HFpEF) heart failure with preserved ejection fraction (HCC)    CAD (coronary artery disease)    a. s/p PCI to LAD 2001. b. s/p stenting to the RCA and mid LAD December 2011, residual distal RCA disease treated medically. c.  Multivessel disease status post CABG October 2020   Carotid artery disease    Essential hypertension    GERD (gastroesophageal reflux disease)    Habitual alcohol  use    Hyperlipidemia    Low back pain    Myocardial infarction (HCC) 2020   OSA on CPAP    Osteoarthritis    Permanent atrial fibrillation (HCC)    a. dx 10/2017   Polysubstance abuse (HCC)    History of marijuana and Vicodin abuse   PVC's (premature ventricular contractions)    Seasonal allergies    Stroke [facility]) 2020   Past Surgical History:  Procedure Laterality Date   CARDIOVERSION N/A 11/19/2017   Procedure: CARDIOVERSION;  Surgeon: Charls Pearla LABOR, MD;  Location: [facility];  Service: Cardiovascular;  Laterality: N/A;   COLONOSCOPY WITH PROPOFOL  N/A 03/01/2020   Procedure: COLONOSCOPY WITH PROPOFOL ;  Surgeon: Eartha Angelia Sieving, MD;  Location: [facility];  Service: Gastroenterology;  Laterality: N/A;  730   CORONARY ANGIOPLASTY WITH STENT PLACEMENT  2011    CORONARY ARTERY BYPASS GRAFT N/A 03/10/2019   Procedure: CORONARY ARTERY BYPASS GRAFTING (CABG)X 3 Using Left Internal Mammary Artery and Right Saphenous Vein Grafts;  Surgeon: Fleeta Hanford Coy, MD;  Location[facility];  Service: Open Heart Surgery;  Laterality: N/A;   ESOPHAGOGASTRODUODENOSCOPY (EGD) WITH PROPOFOL  N/A 03/01/2020   Procedure: ESOPHAGOGASTRODUODENOSCOPY (EGD) WITH PROPOFOL ;  Surgeon: Eartha Angelia Sieving, MD;  Location: [facility];  Service: Gastroenterology;  Laterality: N/A;   LEFT HEART CATH AND CORONARY ANGIOGRAPHY N/A 03/08/2019   Procedure: LEFT HEART CATH AND CORONARY ANGIOGRAPHY;  Surgeon: Claudene Victory ORN, MD;  Location: [facility];  Service: Cardiovascular;  Laterality: N/A;   POLYPECTOMY  03/01/2020   Procedure: POLYPECTOMY;  Surgeon: Eartha Angelia Sieving, MD;  Location: [facility];  Service: Gastroenterology;;   TEE WITHOUT CARDIOVERSION N/A 03/10/2019   Procedure: TRANSESOPHAGEAL ECHOCARDIOGRAM (TEE);  Surgeon: Fleeta Hanford, Coy, MD;  Location: [facility] OR;  Service: Open Heart Surgery;  Laterality: N/A;   TONSILLECTOMY     Patient Active Problem List   Diagnosis Date Noted   Closed fracture of distal end of radius 11/30/2023   Pleural effusion 11/16/2023   Cellulitis of lower leg 11/09/2023   Open wound of toe 11/09/2023   Benign prostatic hyperplasia 11/03/2023   Carotid artery occlusion 11/03/2023  Depressive disorder 11/03/2023   Dyspnea 11/03/2023   Gastroesophageal reflux disease without esophagitis 11/03/2023   Gastrointestinal hemorrhage 11/03/2023   Hypertensive heart failure (HCC) 11/03/2023   Impaired fasting glucose 11/03/2023   Mild major depression, single episode 11/03/2023   Mixed hyperlipidemia 11/03/2023   Nonrheumatic aortic valve stenosis 11/03/2023   Primary thrombocytopenia (HCC) 11/03/2023   Stasis dermatitis 11/03/2023   Vertebrogenic low back pain 11/03/2023   Fracture of left shoulder with routine healing 11/03/2023    Prediabetes 10/25/2023   Closed left humeral fracture 08/22/2023   Acute on chronic diastolic CHF (congestive heart failure) (HCC) 08/21/2023   Pleural effusion on right 01/26/2022   Gout 05/22/2021   Positive fecal occult blood test 01/25/2020   Anemia 01/25/2020   Left leg weakness 04/26/2019   Generalized anxiety disorder    Peripheral edema    Morbid obesity (HCC)    Acute gout of right ankle    Hypoalbuminemia due to protein-calorie malnutrition    Acute blood loss anemia    Chronic diastolic congestive heart failure (HCC)    Debility 03/27/2019   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 (HCC) 03/27/2019   Lethargy    Persistent atrial fibrillation (HCC)    Cerebral embolism with cerebral infarction 03/13/2019   Hx of CABG 03/10/2019   NSTEMI (non-ST elevated myocardial infarction) (HCC) 03/07/2019   Unstable angina (HCC) 03/07/2019   Anxiety 04/23/2018   Difficulty sleeping 01/06/2018   Seasonal allergies 01/04/2018   OSA on CPAP 05/27/2016   Obesity, class 2 06/10/2011   Insomnia 06/10/2011   PVC's (premature ventricular contractions)    CAD (coronary artery disease)    Carotid artery disease    Osteoarthritis 07/28/2010   Dyslipidemia 06/17/2010   SUBSTANCE ABUSE, MULTIPLE 06/17/2010   Essential hypertension 06/17/2010   Coronary atherosclerosis 06/17/2010   BRADYCARDIA 06/17/2010    ONSET DATE: chronic  REFERRING DIAG: History of falling   THERAPY DIAG:  History of falling  Muscle weakness (generalized)  Other abnormalities of gait and mobility  Rationale for Evaluation and Treatment: Rehabilitation  SUBJECTIVE:                                                                                                                                                                                             SUBJECTIVE STATEMENT: Patient has had problems with his balance for a while now. He had physical therapy previously which really  helped, but he got out of doing his exercises. He has been having trouble getting back to exercising. He has had 3 falls in the last six months with the most recent  being about 6 weeks ago where he sprained his right wrist. He has also cracked his left wrist and his collarbone from falling. He also had a stroke which effected his left side so he has a hard time lifting his left leg.  Pt accompanied by: self  PERTINENT HISTORY: HTN, history of a MI, angina, atrial fibrillation, CHF, OA, allergies, and history of a CVA   PAIN:  Are you having pain? Yes: NPRS scale: 8/10 Pain location: right wrist Pain description: throbbing  PRECAUTIONS: Fall  RED FLAGS: None   WEIGHT BEARING RESTRICTIONS: No  FALLS: Has patient fallen in last 6 months? Yes. Number of falls 3  LIVING ENVIRONMENT: Lives with: lives alone Lives in: House/apartment Stairs: Yes: External: 2 steps; can reach both Has following equipment at home: Single point cane and Walker - 2 wheeled  PLOF: Independent  PATIENT GOALS: improved balance and mobility   OBJECTIVE:  Note: Objective measures were completed at Evaluation unless otherwise noted.  COGNITION: Overall cognitive status: Within functional limits for tasks assessed   SENSATION: Light touch: No deficits bilaterally Patient reports that he has neuropathy in both feet.   COORDINATION: Poor coordination as he was unable to maintain contact bilaterally during heel to shin test   EDEMA:  No edema observed   POSTURE: rounded shoulders and forward head  LOWER EXTREMITY ROM: WFL for activities assessed  LOWER EXTREMITY MMT:    MMT Right Eval Left Eval  Hip flexion 4/5 4-/5  Hip extension    Hip abduction    Hip adduction 5/5 4/5  Hip internal rotation    Hip external rotation    Knee flexion 4+/5 4/5  Knee extension 5/5 4+/5  Ankle dorsiflexion 4+/5 4+/5  Ankle plantarflexion    Ankle inversion    Ankle eversion    (Blank rows = not  tested)  TRANSFERS: Sit to stand: Complete Independence  Assistive device utilized: None     Stand to sit: Complete Independence  Assistive device utilized: None      GAIT: Findings: Gait Characteristics: step to pattern, decreased step length- Left, decreased stance time- Right, decreased stride length, decreased ankle dorsiflexion- Right, decreased ankle dorsiflexion- Left, Right foot flat, Left foot flat, shuffling, trunk flexed, wide BOS, poor foot clearance- Right, and poor foot clearance- Left, Distance walked: 239 feet , Assistive device utilized:None, Level of assistance: SBA, and Comments: Gait speed: 0.69 m/s   FUNCTIONAL TESTS:  5 times sit to stand: 12.61 seconds with mild unsteadiness Timed up and go (TUG): 15.10 seconds  2 minute walk test: 239 feet with 1 standing rest break after 1:30  PATIENT SURVEYS:  ABC scale: The Activities-Specific Balance Confidence (ABC) Scale 0% 10 20 30  40 50 60 70 80 90 100% No confidence<->completely confident  "How confident are you that you will not lose your balance or become unsteady when you . . .   Date tested 04/20/24  Walk around the house 90%  2. Walk up or down stairs 100%  3. Bend over and pick up a slipper from in front of a closet floor 50%  4. Reach for a small can off a shelf at eye level 100%  5. Stand on tip toes and reach for something above your head 0%  6. Stand on a chair and reach for something 0%  7. Sweep the floor 100%  8. Walk outside the house to a car parked in the driveway 75%  9. Get into or out of a car 100%  10. Walk across a parking lot to the mall 50%  11. Walk up or down a ramp 25%  12. Walk in a crowded mall where people rapidly walk past you 50%  13. Are bumped into by people as you walk through the mall 50%  14. Step onto or off of an escalator while you are holding onto the railing 50%  15. Step onto or off an escalator while holding onto parcels such that you cannot hold onto the railing 0%  16.  Walk outside on icy sidewalks 0%  Total: #/16  52.5%                                                                                                                                 TREATMENT DATE:   04/20/24: PT evaluation, patient education, and HEP   PATIENT EDUCATION: Education details: POC, prognosis, HEP, objective findings, activity modifications, benefits of exercise, and goals for physical therapy Person educated: Patient Education method: Explanation, Demonstration, and Handouts Education comprehension: verbalized understanding and returned demonstration  HOME EXERCISE PROGRAM: Access Code: [email or web address] URL: [email or web address] Date: 04/20/2024 Prepared by: Lacinda Fass  Exercises - Seated March  - 1 x daily - 7 x weekly - 3 sets - 10 reps - Seated Hip Adduction Isometrics with Ball  - 1 x daily - 7 x weekly - 3 sets - 10 reps - 5 seconds  hold - Sit to Stand with Counter Support  - 1 x daily - 7 x weekly - 2 sets - 10 reps  GOALS: Goals reviewed with patient? Yes  SHORT TERM GOALS: Target date: 05/11/24  Patient will be independent with his initial HEP.  Baseline: Goal status: INITIAL  2.  Patient will be able to complete the 2 minute walk test without requiring a rest break for improved community mobility.  Baseline:  Goal status: INITIAL  LONG TERM GOALS: Target date: 06/01/24  Patient will be independent with his advanced HEP.  Baseline:  Goal status: INITIAL  2.  Patient will improve his TUG time to 12 seconds or less to reduce his risk of falling.  Baseline:  Goal status: INITIAL  3.  Patient will improve his two minute walk test by at least 50 feet for improved community mobility.  Baseline:  Goal status: INITIAL  4.  Patient will improve his gait speed to at least 0.8 m/s for improved household mobility.  Baseline:  Goal status: INITIAL  5.  Patient will improve his ABC scale by at least 15% for improved perceived function with his  daily activities.  Baseline:  Goal status: INITIAL  ASSESSMENT:  CLINICAL IMPRESSION: Patient is a 68 y.o. male who was seen today for physical therapy evaluation and treatment for a history of falling. He is a high fall risk as evidenced by his objective measures, functional testing, gait mechanics, and history of falling. He has fallen 3 times in the past six months with each  of these falls resulting in injuries to various other body parts such as his wrists and collarbone. Recommend that he continue with skilled physical therapy to address his impairments to maximize his safety and functional mobility.    OBJECTIVE IMPAIRMENTS: Abnormal gait, decreased activity tolerance, decreased balance, decreased endurance, decreased mobility, difficulty walking, decreased strength, and impaired sensation.   ACTIVITY LIMITATIONS: carrying, lifting, standing, stairs, transfers, and locomotion level  PARTICIPATION LIMITATIONS: cleaning, shopping, community activity, and yard work  PERSONAL FACTORS: Past/current experiences, Time since onset of injury/illness/exacerbation, and 3+ comorbidities: HTN, history of a MI, angina, atrial fibrillation, CHF, OA, allergies, and history of a CVA  are also affecting patient's functional outcome.   REHAB POTENTIAL: Fair    CLINICAL DECISION MAKING: Evolving/moderate complexity  EVALUATION COMPLEXITY: Moderate  PLAN:  PT FREQUENCY: 2x/week  PT DURATION: 6 weeks  PLANNED INTERVENTIONS: 97164- PT Re-evaluation, 97750- Physical Performance Testing, 97110-Therapeutic exercises, 97530- Therapeutic activity, 97112- Neuromuscular re-education, 97535- Self Care, [id number]- Manual therapy, [id number]- Gait training, Patient/Family education, Balance training, Stair training, Cryotherapy, and Moist heat  PLAN FOR NEXT SESSION: Nustep, lower extremity strengthening, gait training, and balance interventions   Lacinda JAYSON Fass, PT 04/20/2024, 12:24 PM  [employer] Auth  Request Treatment Start Date: 04/20/24  Referring diagnosis code (ICD 10)? Z91.81  Treatment diagnosis codes (ICD 10)? (if different than referring diagnosis) Z91.81, M62.81, R26.89  What was this (referring dx) caused by? []  Surgery [x]  Fall []  Ongoing issue []  Arthritis []  Other: ____________  Laterality: []  Rt []  Lt [x]  Both  Deficits: []  Pain []  Stiffness [x]  Weakness []  Edema [x]  Balance Deficits []  Coordination [x]  Gait Disturbance []  ROM []  Other   Functional Tool Score: 2 minute walk test: 239 feet   CPT codes: See Planned Interventions listed in the Plan section of the Evaluation.

## 2024-05-03 ENCOUNTER — Encounter (HOSPITAL_COMMUNITY): Payer: Self-pay

## 2024-05-03 ENCOUNTER — Emergency Department (HOSPITAL_COMMUNITY)

## 2024-05-03 ENCOUNTER — Emergency Department (HOSPITAL_COMMUNITY)
Admission: EM | Admit: 2024-05-03 | Discharge: 2024-05-03 | Disposition: A | Discharge status: Home / Self Care | Attending: Emergency Medicine | Admitting: Emergency Medicine

## 2024-05-03 ENCOUNTER — Other Ambulatory Visit: Payer: Self-pay

## 2024-05-03 DIAGNOSIS — M47816 Spondylosis without myelopathy or radiculopathy, lumbar region: Secondary | ICD-10-CM | POA: Diagnosis not present

## 2024-05-03 DIAGNOSIS — Y9301 Activity, walking, marching and hiking: Secondary | ICD-10-CM | POA: Diagnosis not present

## 2024-05-03 DIAGNOSIS — S6992XA Unspecified injury of left wrist, hand and finger(s), initial encounter: Secondary | ICD-10-CM | POA: Diagnosis not present

## 2024-05-03 DIAGNOSIS — Z79899 Other long term (current) drug therapy: Secondary | ICD-10-CM | POA: Diagnosis not present

## 2024-05-03 DIAGNOSIS — M19042 Primary osteoarthritis, left hand: Secondary | ICD-10-CM | POA: Diagnosis not present

## 2024-05-03 DIAGNOSIS — Z7901 Long term (current) use of anticoagulants: Secondary | ICD-10-CM | POA: Insufficient documentation

## 2024-05-03 DIAGNOSIS — Z8673 Personal history of transient ischemic attack (TIA), and cerebral infarction without residual deficits: Secondary | ICD-10-CM | POA: Insufficient documentation

## 2024-05-03 DIAGNOSIS — M778 Other enthesopathies, not elsewhere classified: Secondary | ICD-10-CM | POA: Diagnosis not present

## 2024-05-03 DIAGNOSIS — W19XXXA Unspecified fall, initial encounter: Secondary | ICD-10-CM | POA: Diagnosis not present

## 2024-05-03 DIAGNOSIS — W01198A Fall on same level from slipping, tripping and stumbling with subsequent striking against other object, initial encounter: Secondary | ICD-10-CM | POA: Diagnosis not present

## 2024-05-03 DIAGNOSIS — M25512 Pain in left shoulder: Secondary | ICD-10-CM | POA: Insufficient documentation

## 2024-05-03 DIAGNOSIS — S4992XA Unspecified injury of left shoulder and upper arm, initial encounter: Secondary | ICD-10-CM | POA: Diagnosis not present

## 2024-05-03 DIAGNOSIS — S3992XA Unspecified injury of lower back, initial encounter: Secondary | ICD-10-CM | POA: Diagnosis not present

## 2024-05-03 DIAGNOSIS — S20229A Contusion of unspecified back wall of thorax, initial encounter: Secondary | ICD-10-CM | POA: Diagnosis not present

## 2024-05-03 DIAGNOSIS — I10 Essential (primary) hypertension: Secondary | ICD-10-CM | POA: Diagnosis not present

## 2024-05-03 DIAGNOSIS — M19012 Primary osteoarthritis, left shoulder: Secondary | ICD-10-CM | POA: Diagnosis not present

## 2024-05-03 DIAGNOSIS — S300XXA Contusion of lower back and pelvis, initial encounter: Secondary | ICD-10-CM | POA: Diagnosis not present

## 2024-05-03 DIAGNOSIS — M48061 Spinal stenosis, lumbar region without neurogenic claudication: Secondary | ICD-10-CM | POA: Diagnosis not present

## 2024-05-03 DIAGNOSIS — M51369 Other intervertebral disc degeneration, lumbar region without mention of lumbar back pain or lower extremity pain: Secondary | ICD-10-CM | POA: Diagnosis not present

## 2024-05-03 MED ORDER — BACITRACIN ZINC 500 UNIT/GM EX OINT
TOPICAL_OINTMENT | Freq: Two times a day (BID) | CUTANEOUS | Status: DC
  Filled 2024-05-03: qty 0.9

## 2024-05-03 MED ORDER — NAPROXEN 500 MG PO TABS: 500.0000 mg | ORAL_TABLET | Freq: Two times a day (BID) | ORAL | 0 refills | Status: DC

## 2024-05-03 MED ORDER — METHOCARBAMOL 500 MG PO TABS: 500.0000 mg | ORAL_TABLET | Freq: Two times a day (BID) | ORAL | 0 refills | Status: DC | PRN

## 2024-05-03 MED ORDER — METHOCARBAMOL 500 MG PO TABS
500.0000 mg | ORAL_TABLET | Freq: Once | ORAL | Status: AC
  Administered 2024-05-03: 500 mg via ORAL
  Filled 2024-05-03: qty 1

## 2024-05-03 MED ORDER — NAPROXEN 250 MG PO TABS
500.0000 mg | ORAL_TABLET | Freq: Once | ORAL | Status: AC
  Administered 2024-05-03: 500 mg via ORAL
  Filled 2024-05-03: qty 2

## 2024-05-03 NOTE — ED Notes (Signed)
 Cleansed pt's lacerations on left shoulder and left middle finger with normal saline. Bacitracin  ointment used on lacerations before dressing them.

## 2024-05-03 NOTE — ED Provider Notes (Signed)
 [place] [facility]GENCY DEPARTMENT AT [facility] Provider Note   CSN: [id number] Arrival date & time: 05/03/24  1859     Patient presents with: Back Pain and Fall   Martin [name] is a 68 y.o. male.    Back Pain Fall   This patient is a 68 year old male, he has a history of prior stroke for which he takes Eliquis , he has difficulty using his left leg and so he ambulates with some difficulty, he drove himself to the grocery store, as he was walking into the grocery store he tripped over his left foot which gives him trouble causing him to fall to the ground landing on his lower back and his left shoulder.  The patient denies head injury, he had some passersby helped him up and get him back to his car, the paramedics were called but he declined transport.  He drove himself home, his son helped him into the house, once in the house the patient walked up and down the hallways but because of his pain he felt unsafe to stay at home until he was seen by a provider tonight.  He denies numbness or weakness going down the legs, denies head injury    Prior to Admission medications   Medication Sig Start Date End Date Taking? Authorizing Provider  allopurinol  (ZYLOPRIM ) 300 MG tablet Take 300 mg by mouth daily.    Yes [provider]  atorvastatin  (LIPITOR ) 80 MG tablet TAKE ONE TABLET BY MOUTH EVERY DAY Patient taking differently: Take 80 mg by mouth at bedtime. 02/01/24  Yes Debera Jayson MATSU, MD  chlorhexidine  (PERIDEX ) 0.12 % solution Use as directed 5 mLs in the mouth or throat at bedtime. 04/15/24  Yes [provider]  Cholecalciferol (VITAMIN D-3) 1000 UNITS CAPS Take 1,000 Units by mouth every morning.    Yes [provider]  Cyanocobalamin  (VITAMIN B-12) 2500 MCG SUBL Place 2,500 mcg under the tongue every morning.    Yes [provider]  diclofenac  Sodium (VOLTAREN ) 1 % GEL Apply 4 g topically 4 (four) times daily as needed (Pain). Patient  taking differently: Apply 4 g topically 4 (four) times daily as needed (wrist pain). 03/14/24  Yes Beatty, Celeste A, PA-C  docusate sodium  (COLACE) 100 MG capsule Take 2 capsules (200 mg total) by mouth daily. Patient taking differently: Take 100 mg by mouth at bedtime. 04/11/19  Yes Love, Sharlet RAMAN, PA-C  ELIQUIS  5 MG TABS tablet TAKE ONE TABLET BY MOUTH TWICE DAILY 01/18/24  Yes Debera Jayson MATSU, MD  escitalopram (LEXAPRO) 10 MG tablet Take 10 mg by mouth daily. 08/04/23  Yes [provider]  fluticasone  (FLONASE ) 50 MCG/ACT nasal spray Place 2 sprays into the nose daily.   Yes [provider]  folic acid  (FOLVITE ) 1 MG tablet TAKE ONE TABLET BY MOUTH EVERY DAY 02/07/12  Yes de Cyndia Mal BRAVO, MD  furosemide  (LASIX ) 40 MG tablet TAKE ONE TABLET BY MOUTH EVERY DAY 01/05/24  Yes Debera Jayson MATSU, MD  HYDROcodone -acetaminophen  (NORCO) 10-325 MG tablet Take 1 tablet by mouth every 6 (six) hours as needed for severe pain (pain score 7-10). 05/17/23  Yes [provider]  loratadine  (CLARITIN ) 10 MG tablet Take 10 mg by mouth daily.   Yes [provider]  LORazepam  (ATIVAN ) 0.5 MG tablet Take 0.5 mg by mouth at bedtime.  05/15/19  Yes [provider]  methocarbamol  (ROBAXIN ) 500 MG tablet Take 1 tablet (500 mg total) by mouth 2 (two) times  daily as needed for muscle spasms. 05/03/24  Yes Cleotilde Rogue, MD  [facility] 5 MG/0.5ML Pen Inject 5 mg into the skin once a week. 02/15/24  Yes [provider]  mupirocin  ointment (BACTROBAN ) 2 % Apply 1 Application topically 2 (two) times daily. 11/09/23  Yes [provider]  naproxen  (NAPROSYN ) 500 MG tablet Take 1 tablet (500 mg total) by mouth 2 (two) times daily with a meal. 05/03/24  Yes Cleotilde Rogue, MD  nitroGLYCERIN  (NITROSTAT ) 0.4 MG SL tablet Place 1 tablet (0.4 mg total) under the tongue every 5 (five) minutes as needed for chest pain. 07/27/22  Yes Debera Jayson MATSU, MD  pantoprazole  (PROTONIX ) 40 MG  tablet Take 1 tablet (40 mg total) by mouth daily. 04/11/19  Yes Love, Sharlet RAMAN, PA-C  psyllium (REGULOID) 0.52 g capsule Take 0.52 g by mouth in the morning and at bedtime.   Yes [provider]  sacubitril -valsartan  (ENTRESTO ) 24-26 MG TAKE ONE TABLET BY MOUTH TWICE DAILY 03/08/24  Yes Debera Jayson MATSU, MD  spironolactone  (ALDACTONE ) 25 MG tablet TAKE 1/2 TABLET BY MOUTH DAILY 08/18/23  Yes Debera Jayson MATSU, MD  tamsulosin  (FLOMAX ) 0.4 MG CAPS capsule Take 0.4 mg by mouth at bedtime.   Yes [provider]    Allergies: Penicillins    Review of Systems  Musculoskeletal:  Positive for back pain.  All other systems reviewed and are negative.   Updated Vital Signs BP (!) 166/72   Pulse 63   Temp 98.8 F (37.1 C) (Oral)   Resp 20   Ht 1.676 m (5' 6)   Wt 104.3 kg   SpO2 93%   BMI 37.12 kg/m   Physical Exam Vitals and nursing note reviewed.  Constitutional:      Appearance: He is well-developed. He is not diaphoretic.  HENT:     Head: Normocephalic and atraumatic.  Eyes:     General:        Right eye: No discharge.        Left eye: No discharge.     Conjunctiva/sclera: Conjunctivae normal.  Pulmonary:     Effort: Pulmonary effort is normal. No respiratory distress.  Musculoskeletal:        General: Tenderness present.     Comments: The patient has a slight skin tear over the third digit of the left hand, totally normal joints at the MCP, proximal and distal IP joints, able to make a fist, able to extend and flex at all the fingers without difficulty.  Normal pulses at the wrist, normal capillary refill distally.  The patient has good range of motion of the left shoulder but has an area of significant abrasion and contusion over that left shoulder.  He has tenderness over the lumbar spine and paraspinal muscles, no thoracic tenderness.  No tenderness in the lower extremities, no signs of head injury, no cervical spine tenderness  Skin:    General: Skin is  warm and dry.     Findings: No erythema or rash.  Neurological:     Mental Status: He is alert.     Coordination: Coordination normal.     (all labs ordered are listed, but only abnormal results are displayed) Labs Reviewed - No data to display  EKG: None  Radiology: DG Hand Complete Left Result Date: 05/03/2024 EXAM: 3 OR MORE VIEW(S) XRAY OF THE LEFT HAND 05/03/2024 08:12:00 PM COMPARISON: None available. CLINICAL HISTORY: trauma FINDINGS: BONES AND JOINTS: No acute fracture. No focal osseous lesion. No joint dislocation.  Degenerative changes in the IP joints diffusely and the 2nd and 3rd MCP joints. SOFT TISSUES: The soft tissues are unremarkable. IMPRESSION: 1. No acute osseous abnormality. Electronically signed by: Franky Crease MD 05/03/2024 08:19 PM EST RP Workstation: [id number]   DG Shoulder Left Result Date: 05/03/2024 EXAM: 1 VIEW(S) XRAY OF THE LEFT SHOULDER 05/03/2024 08:12:00 PM COMPARISON: None available. CLINICAL HISTORY: trauma, fall FINDINGS: BONES AND JOINTS: Degenerative changes in the left AC and glenohumeral joints with joint space narrowing and spurring. No acute fracture or dislocation. SOFT TISSUES: No abnormal calcifications. Visualized lung is unremarkable. IMPRESSION: 1. No evidence of acute traumatic injury. Electronically signed by: Franky Crease MD 05/03/2024 08:19 PM EST RP Workstation: [id number]   DG Sacrum/Coccyx Result Date: 05/03/2024 EXAM: _VIEWS_ VIEW(S) XRAY OF THE SACRUM AND COCCYX 05/03/2024 08:12:00 PM COMPARISON: None available. CLINICAL HISTORY: trauma, fall FINDINGS: BONES AND JOINTS: Degenerative changes noted in the lower LUMBAR spine. No acute fracture. No focal osseous lesion. No joint dislocation. SOFT TISSUES: Aortoiliac atherosclerosis. IMPRESSION: 1. No acute findings. Electronically signed by: Franky Crease MD 05/03/2024 08:17 PM EST RP Workstation: [id number]   DG Lumbar Spine Complete Result Date: 05/03/2024 EXAM: 4 VIEW(S) XRAY OF THE  LUMBAR SPINE 05/03/2024 08:12:00 PM COMPARISON: None available. CLINICAL HISTORY: trauma, fall FINDINGS: LUMBAR SPINE: BONES: No acute fracture. No aggressive appearing osseous lesion. Alignment is normal. DISCS AND DEGENERATIVE CHANGES: Diffuse degenerative disc disease with disc space narrowing and spurring, most pronounced at L2-3. Diffuse degenerative facet disease, most pronounced in the lower lumbar spine. SOFT TISSUES: No acute abnormality. VASCULATURE: Diffuse aortoiliac atherosclerosis. IMPRESSION: 1. No acute abnormality of the lumbar spine related to the reported trauma. 2. Multilevel degenerative disc and facet disease. Electronically signed by: Franky Crease MD 05/03/2024 08:16 PM EST RP Workstation: [id number]     Procedures   Medications Ordered in the ED  bacitracin  ointment (has no administration in time range)  naproxen  (NAPROSYN ) tablet 500 mg (500 mg Oral Given 05/03/24 2016)  methocarbamol  (ROBAXIN ) tablet 500 mg (500 mg Oral Given 05/03/24 2015)                                    Medical Decision Making Amount and/or Complexity of Data Reviewed Radiology: ordered.  Risk OTC drugs. Prescription drug management.   Overall this patient has had a fall with some isolated injuries to the lower back, will image to make sure there is no fractures but the patient is neurologically intact on my exam and ambulatory.  Vital signs are unremarkable, his wounds will be dressed, there is no lacerations that need repairing   Radiology Imaging: I personally viewed the images of the ordered radiographic studies and find no findings of acute fracture of the hand the shoulder or the lower back I agree with the radiologist interpretation as well   Meds / Interventions: while in the ED the patient received the following: Bacitracin  and sterile dressing, Naprosyn  and Robaxin  The response to the interventions was that the patient improved  I have discussed with the patient at the bedside  the results, and the meaning of these results.  They have had opportunity to ask questions,  expressed their understanding to the need for follow-up with primary care physician      Final diagnoses:  Contusion of lumbar spinal region    ED Discharge Orders          Ordered    naproxen  (NAPROSYN )  500 MG tablet  2 times daily with meals        05/03/24 2033    methocarbamol  (ROBAXIN ) 500 MG tablet  2 times daily PRN        05/03/24 2033               Cleotilde Rogue, MD 05/03/24 2034

## 2024-05-03 NOTE — ED Notes (Signed)
 Patient transported to X-ray

## 2024-05-03 NOTE — Discharge Instructions (Signed)
 Thankfully your x-rays do not reveal any signs of broken bones of your hand your shoulder or your back or tailbone.  I would like for you to take the Naprosyn  twice a day as needed for pain, Robaxin  can be used 2 times a day as needed for muscle spasms.  Drink plenty of clear liquids and see your doctor in 3 to 4 days for recheck  Thank you for allowing us  to treat you in the emergency department today.  After reviewing your examination and potential testing that was done it appears that you are safe to go home.  I would like for you to follow-up with your doctor within the next several days, have them obtain your records and follow-up with them to review all potential tests and results from your visit.  If you should develop severe or worsening symptoms return to the emergency department immediately

## 2024-05-03 NOTE — ED Triage Notes (Signed)
 Pt arrived via [facility] after having a fall. Pt complaining of back pain; pt IS ON thinners. Pt states he might have hit his head and small amount of pain to left shoulder, no LOC. Vitals stable with EMS.

## 2024-05-08 ENCOUNTER — Ambulatory Visit: Admitting: Nurse Practitioner

## 2024-05-12 DIAGNOSIS — M47816 Spondylosis without myelopathy or radiculopathy, lumbar region: Secondary | ICD-10-CM | POA: Diagnosis not present

## 2024-05-12 DIAGNOSIS — M4856XA Collapsed vertebra, not elsewhere classified, lumbar region, initial encounter for fracture: Secondary | ICD-10-CM | POA: Diagnosis not present

## 2024-05-12 DIAGNOSIS — I10 Essential (primary) hypertension: Secondary | ICD-10-CM | POA: Diagnosis not present

## 2024-05-12 DIAGNOSIS — M542 Cervicalgia: Secondary | ICD-10-CM | POA: Diagnosis not present

## 2024-05-12 DIAGNOSIS — M47812 Spondylosis without myelopathy or radiculopathy, cervical region: Secondary | ICD-10-CM | POA: Diagnosis not present

## 2024-05-17 ENCOUNTER — Ambulatory Visit (HOSPITAL_COMMUNITY): Admitting: Physical Therapy

## 2024-05-18 ENCOUNTER — Ambulatory Visit (HOSPITAL_COMMUNITY)

## 2024-05-23 ENCOUNTER — Ambulatory Visit (HOSPITAL_COMMUNITY): Admitting: Physical Therapy

## 2024-05-25 ENCOUNTER — Ambulatory Visit (HOSPITAL_COMMUNITY): Admitting: Physical Therapy

## 2024-06-05 ENCOUNTER — Ambulatory Visit (HOSPITAL_COMMUNITY)

## 2024-06-07 ENCOUNTER — Ambulatory Visit (HOSPITAL_COMMUNITY)

## 2024-06-08 DEATH — deceased

## 2024-06-13 ENCOUNTER — Ambulatory Visit (HOSPITAL_COMMUNITY)

## 2024-06-15 ENCOUNTER — Ambulatory Visit (HOSPITAL_COMMUNITY)

## 2024-06-19 ENCOUNTER — Ambulatory Visit (HOSPITAL_COMMUNITY)

## 2024-06-21 ENCOUNTER — Ambulatory Visit (HOSPITAL_COMMUNITY): Admitting: Physical Therapy

## 2024-06-27 ENCOUNTER — Ambulatory Visit (HOSPITAL_COMMUNITY)

## 2024-08-10 ENCOUNTER — Ambulatory Visit: Payer: Self-pay | Admitting: Nurse Practitioner
